# Patient Record
Sex: Female | Born: 1935 | Race: White | Hispanic: No | Smoking: Former smoker
Health system: Southern US, Community
[De-identification: ages and names within clinical notes are randomized; demographics above are authoritative.]

## PROBLEM LIST (undated history)

## (undated) DIAGNOSIS — E1142 Type 2 diabetes mellitus with diabetic polyneuropathy: Secondary | ICD-10-CM

## (undated) DIAGNOSIS — S069X9A Unspecified intracranial injury with loss of consciousness of unspecified duration, initial encounter: Secondary | ICD-10-CM

## (undated) DIAGNOSIS — I10 Essential (primary) hypertension: Secondary | ICD-10-CM

## (undated) DIAGNOSIS — K219 Gastro-esophageal reflux disease without esophagitis: Secondary | ICD-10-CM

## (undated) DIAGNOSIS — I499 Cardiac arrhythmia, unspecified: Secondary | ICD-10-CM

## (undated) DIAGNOSIS — K922 Gastrointestinal hemorrhage, unspecified: Secondary | ICD-10-CM

## (undated) DIAGNOSIS — T39395A Adverse effect of other nonsteroidal anti-inflammatory drugs [NSAID], initial encounter: Secondary | ICD-10-CM

## (undated) DIAGNOSIS — R0600 Dyspnea, unspecified: Secondary | ICD-10-CM

## (undated) DIAGNOSIS — I480 Paroxysmal atrial fibrillation: Secondary | ICD-10-CM

## (undated) DIAGNOSIS — R011 Cardiac murmur, unspecified: Secondary | ICD-10-CM

## (undated) DIAGNOSIS — E039 Hypothyroidism, unspecified: Secondary | ICD-10-CM

## (undated) DIAGNOSIS — I639 Cerebral infarction, unspecified: Secondary | ICD-10-CM

## (undated) DIAGNOSIS — N183 Chronic kidney disease, stage 3 unspecified: Secondary | ICD-10-CM

## (undated) DIAGNOSIS — G43909 Migraine, unspecified, not intractable, without status migrainosus: Secondary | ICD-10-CM

## (undated) DIAGNOSIS — G250 Essential tremor: Secondary | ICD-10-CM

## (undated) DIAGNOSIS — J189 Pneumonia, unspecified organism: Secondary | ICD-10-CM

## (undated) DIAGNOSIS — G2581 Restless legs syndrome: Secondary | ICD-10-CM

## (undated) DIAGNOSIS — E7800 Pure hypercholesterolemia, unspecified: Secondary | ICD-10-CM

## (undated) DIAGNOSIS — I5022 Chronic systolic (congestive) heart failure: Secondary | ICD-10-CM

## (undated) DIAGNOSIS — F419 Anxiety disorder, unspecified: Secondary | ICD-10-CM

## (undated) DIAGNOSIS — Z9289 Personal history of other medical treatment: Secondary | ICD-10-CM

## (undated) DIAGNOSIS — I428 Other cardiomyopathies: Secondary | ICD-10-CM

## (undated) DIAGNOSIS — I951 Orthostatic hypotension: Secondary | ICD-10-CM

## (undated) DIAGNOSIS — E119 Type 2 diabetes mellitus without complications: Secondary | ICD-10-CM

## (undated) DIAGNOSIS — M1990 Unspecified osteoarthritis, unspecified site: Secondary | ICD-10-CM

## (undated) DIAGNOSIS — D649 Anemia, unspecified: Secondary | ICD-10-CM

## (undated) DIAGNOSIS — R202 Paresthesia of skin: Secondary | ICD-10-CM

## (undated) DIAGNOSIS — K449 Diaphragmatic hernia without obstruction or gangrene: Secondary | ICD-10-CM

## (undated) HISTORY — PX: APPENDECTOMY: SHX54

## (undated) HISTORY — DX: Hypothyroidism, unspecified: E03.9

## (undated) HISTORY — PX: ABDOMINAL HYSTERECTOMY: SHX81

## (undated) HISTORY — PX: COLONOSCOPY: SHX174

## (undated) HISTORY — PX: BREAST SURGERY: SHX581

## (undated) HISTORY — DX: Type 2 diabetes mellitus with diabetic polyneuropathy: E11.42

## (undated) HISTORY — PX: CATARACT EXTRACTION W/ INTRAOCULAR LENS  IMPLANT, BILATERAL: SHX1307

## (undated) HISTORY — PX: DILATION AND CURETTAGE OF UTERUS: SHX78

## (undated) HISTORY — PX: MULTIPLE TOOTH EXTRACTIONS: SHX2053

## (undated) HISTORY — DX: Paresthesia of skin: R20.2

## (undated) HISTORY — DX: Pure hypercholesterolemia, unspecified: E78.00

## (undated) HISTORY — DX: Essential tremor: G25.0

## (undated) HISTORY — PX: TONSILLECTOMY: SUR1361

## (undated) HISTORY — DX: Restless legs syndrome: G25.81

---

## 1998-01-11 ENCOUNTER — Ambulatory Visit (HOSPITAL_COMMUNITY): Admission: RE | Admit: 1998-01-11 | Discharge: 1998-01-11 | Payer: Self-pay | Admitting: *Deleted

## 1999-10-18 ENCOUNTER — Ambulatory Visit (HOSPITAL_COMMUNITY): Admission: RE | Admit: 1999-10-18 | Discharge: 1999-10-18 | Payer: Self-pay | Admitting: General Surgery

## 2008-10-14 DIAGNOSIS — S069X9A Unspecified intracranial injury with loss of consciousness of unspecified duration, initial encounter: Secondary | ICD-10-CM

## 2008-10-14 DIAGNOSIS — S069X1A Unspecified intracranial injury with loss of consciousness of 30 minutes or less, initial encounter: Secondary | ICD-10-CM

## 2008-10-14 HISTORY — DX: Unspecified intracranial injury with loss of consciousness of 30 minutes or less, initial encounter: S06.9X1A

## 2008-10-14 HISTORY — DX: Unspecified intracranial injury with loss of consciousness of unspecified duration, initial encounter: S06.9X9A

## 2009-07-15 ENCOUNTER — Inpatient Hospital Stay (HOSPITAL_COMMUNITY): Admission: EM | Admit: 2009-07-15 | Discharge: 2009-07-20 | Payer: Self-pay | Admitting: Emergency Medicine

## 2009-07-15 ENCOUNTER — Ambulatory Visit: Payer: Self-pay | Admitting: Cardiovascular Disease

## 2009-07-17 ENCOUNTER — Encounter (INDEPENDENT_AMBULATORY_CARE_PROVIDER_SITE_OTHER): Payer: Self-pay | Admitting: General Surgery

## 2009-07-27 ENCOUNTER — Emergency Department (HOSPITAL_COMMUNITY): Admission: EM | Admit: 2009-07-27 | Discharge: 2009-07-27 | Payer: Self-pay | Admitting: Emergency Medicine

## 2009-07-27 ENCOUNTER — Encounter: Admission: RE | Admit: 2009-07-27 | Discharge: 2009-08-08 | Payer: Self-pay | Admitting: Physician Assistant

## 2009-07-29 ENCOUNTER — Inpatient Hospital Stay (HOSPITAL_COMMUNITY): Admission: EM | Admit: 2009-07-29 | Discharge: 2009-08-03 | Payer: Self-pay | Admitting: Emergency Medicine

## 2010-02-01 ENCOUNTER — Encounter: Admission: RE | Admit: 2010-02-01 | Discharge: 2010-02-01 | Payer: Self-pay | Admitting: Endocrinology

## 2010-09-08 ENCOUNTER — Inpatient Hospital Stay (HOSPITAL_COMMUNITY): Admission: EM | Admit: 2010-09-08 | Discharge: 2010-09-11 | Payer: Self-pay | Admitting: Emergency Medicine

## 2010-09-10 ENCOUNTER — Encounter (INDEPENDENT_AMBULATORY_CARE_PROVIDER_SITE_OTHER): Payer: Self-pay | Admitting: Internal Medicine

## 2010-11-28 ENCOUNTER — Other Ambulatory Visit: Payer: Self-pay | Admitting: Neurology

## 2010-11-28 ENCOUNTER — Other Ambulatory Visit: Payer: Self-pay

## 2010-11-28 DIAGNOSIS — R52 Pain, unspecified: Secondary | ICD-10-CM

## 2010-12-01 ENCOUNTER — Ambulatory Visit
Admission: RE | Admit: 2010-12-01 | Discharge: 2010-12-01 | Disposition: A | Discharge status: Home / Self Care | Payer: Medicare Other | Source: Ambulatory Visit

## 2010-12-01 DIAGNOSIS — R52 Pain, unspecified: Secondary | ICD-10-CM

## 2010-12-25 LAB — URINE CULTURE
Colony Count: 85000
Special Requests: NEGATIVE

## 2010-12-25 LAB — BASIC METABOLIC PANEL
BUN: 26 mg/dL — ABNORMAL HIGH (ref 6–23)
CO2: 24 mEq/L (ref 19–32)
CO2: 26 mEq/L (ref 19–32)
CO2: 26 mEq/L (ref 19–32)
Calcium: 8.3 mg/dL — ABNORMAL LOW (ref 8.4–10.5)
Calcium: 8.4 mg/dL (ref 8.4–10.5)
Chloride: 106 mEq/L (ref 96–112)
GFR calc Af Amer: 49 mL/min — ABNORMAL LOW (ref >60)
GFR calc Af Amer: 49 mL/min — ABNORMAL LOW (ref >60)
Glucose, Bld: 253 mg/dL — ABNORMAL HIGH (ref 70–99)
Glucose, Bld: 269 mg/dL — ABNORMAL HIGH (ref 70–99)
Potassium: 4.3 mEq/L (ref 3.5–5.1)
Potassium: 4.3 mEq/L (ref 3.5–5.1)
Sodium: 137 mEq/L (ref 135–145)
Sodium: 138 mEq/L (ref 135–145)

## 2010-12-25 LAB — GLUCOSE, CAPILLARY
Glucose-Capillary: 215 mg/dL — ABNORMAL HIGH (ref 70–99)
Glucose-Capillary: 218 mg/dL — ABNORMAL HIGH (ref 70–99)
Glucose-Capillary: 323 mg/dL — ABNORMAL HIGH (ref 70–99)
Glucose-Capillary: 323 mg/dL — ABNORMAL HIGH (ref 70–99)
Glucose-Capillary: 347 mg/dL — ABNORMAL HIGH (ref 70–99)

## 2010-12-25 LAB — CULTURE, BLOOD (ROUTINE X 2)
Culture  Setup Time: 201111271256
Culture: NO GROWTH
Culture: NO GROWTH

## 2010-12-25 LAB — CARDIAC PANEL(CRET KIN+CKTOT+MB+TROPI)
CK, MB: 0.8 ng/mL (ref 0.3–4.0)
Relative Index: INVALID (ref 0.0–2.5)
Total CK: 65 U/L (ref 7–177)
Total CK: 75 U/L (ref 7–177)
Troponin I: 0.01 ng/mL (ref 0.00–0.06)

## 2010-12-25 LAB — POCT CARDIAC MARKERS
CKMB, poc: <1 ng/mL — ABNORMAL LOW (ref 1.0–8.0)
Myoglobin, poc: 122 ng/mL (ref 12–200)

## 2010-12-25 LAB — CBC
HCT: 30 % — ABNORMAL LOW (ref 36.0–46.0)
HCT: 32.2 % — ABNORMAL LOW (ref 36.0–46.0)
Hemoglobin: 10.2 g/dL — ABNORMAL LOW (ref 12.0–15.0)
Hemoglobin: 10.2 g/dL — ABNORMAL LOW (ref 12.0–15.0)
Hemoglobin: 11.1 g/dL — ABNORMAL LOW (ref 12.0–15.0)
Hemoglobin: 12 g/dL (ref 12.0–15.0)
MCH: 29.3 pg (ref 26.0–34.0)
MCH: 29.6 pg (ref 26.0–34.0)
MCH: 29.6 pg (ref 26.0–34.0)
MCHC: 34.4 g/dL (ref 30.0–36.0)
MCHC: 34.6 g/dL (ref 30.0–36.0)
MCV: 86.4 fL (ref 78.0–100.0)
Platelets: 312 10*3/uL (ref 150–400)
RBC: 3.47 MIL/uL — ABNORMAL LOW (ref 3.87–5.11)
RBC: 3.76 MIL/uL — ABNORMAL LOW (ref 3.87–5.11)
RBC: 4.13 MIL/uL (ref 3.87–5.11)
RDW: 14.1 % (ref 11.5–15.5)
WBC: 16.9 10*3/uL — ABNORMAL HIGH (ref 4.0–10.5)

## 2010-12-25 LAB — POCT I-STAT, CHEM 8
BUN: 14 mg/dL (ref 6–23)
Creatinine, Ser: 1.2 mg/dL (ref 0.4–1.2)
Hemoglobin: 13.3 g/dL (ref 12.0–15.0)
Potassium: 3.7 mEq/L (ref 3.5–5.1)
Sodium: 137 mEq/L (ref 135–145)
TCO2: 32 mmol/L (ref 0–100)

## 2010-12-25 LAB — URINALYSIS, ROUTINE W REFLEX MICROSCOPIC
Glucose, UA: NEGATIVE mg/dL
Hgb urine dipstick: NEGATIVE
Specific Gravity, Urine: 1.013 (ref 1.005–1.030)
pH: 5.5 (ref 5.0–8.0)

## 2010-12-25 LAB — DIFFERENTIAL
Basophils Relative: 0 % (ref 0–1)
Eosinophils Absolute: 0.1 10*3/uL (ref 0.0–0.7)
Lymphs Abs: 1.3 10*3/uL (ref 0.7–4.0)
Monocytes Relative: 9 % (ref 3–12)
Neutro Abs: 5.2 10*3/uL (ref 1.7–7.7)
Neutrophils Relative %: 71 % (ref 43–77)

## 2010-12-25 LAB — HEMOGLOBIN A1C: Hgb A1c MFr Bld: 7.4 % — ABNORMAL HIGH (ref <5.7)

## 2010-12-25 LAB — MRSA PCR SCREENING: MRSA by PCR: NEGATIVE

## 2010-12-25 LAB — D-DIMER, QUANTITATIVE: D-Dimer, Quant: 0.46 ug/mL-FEU (ref 0.00–0.48)

## 2010-12-25 LAB — LEGIONELLA ANTIGEN, URINE

## 2010-12-25 LAB — URINE MICROSCOPIC-ADD ON

## 2011-01-17 LAB — URINALYSIS, ROUTINE W REFLEX MICROSCOPIC
Bilirubin Urine: NEGATIVE
Bilirubin Urine: NEGATIVE
Glucose, UA: 100 mg/dL — AB
Glucose, UA: NEGATIVE mg/dL
Hgb urine dipstick: NEGATIVE
Ketones, ur: NEGATIVE mg/dL
Protein, ur: NEGATIVE mg/dL
Specific Gravity, Urine: 1.012 (ref 1.005–1.030)
pH: 5 (ref 5.0–8.0)

## 2011-01-17 LAB — GLUCOSE, CAPILLARY
Glucose-Capillary: 114 mg/dL — ABNORMAL HIGH (ref 70–99)
Glucose-Capillary: 125 mg/dL — ABNORMAL HIGH (ref 70–99)
Glucose-Capillary: 127 mg/dL — ABNORMAL HIGH (ref 70–99)
Glucose-Capillary: 134 mg/dL — ABNORMAL HIGH (ref 70–99)
Glucose-Capillary: 135 mg/dL — ABNORMAL HIGH (ref 70–99)
Glucose-Capillary: 143 mg/dL — ABNORMAL HIGH (ref 70–99)
Glucose-Capillary: 143 mg/dL — ABNORMAL HIGH (ref 70–99)
Glucose-Capillary: 149 mg/dL — ABNORMAL HIGH (ref 70–99)
Glucose-Capillary: 163 mg/dL — ABNORMAL HIGH (ref 70–99)

## 2011-01-17 LAB — COMPREHENSIVE METABOLIC PANEL
AST: 19 U/L (ref 0–37)
Alkaline Phosphatase: 78 U/L (ref 39–117)
BUN: 23 mg/dL (ref 6–23)
CO2: 27 mEq/L (ref 19–32)
CO2: 29 mEq/L (ref 19–32)
Calcium: 9.4 mg/dL (ref 8.4–10.5)
Chloride: 100 mEq/L (ref 96–112)
Chloride: 99 mEq/L (ref 96–112)
Creatinine, Ser: 1.47 mg/dL — ABNORMAL HIGH (ref 0.4–1.2)
Creatinine, Ser: 1.59 mg/dL — ABNORMAL HIGH (ref 0.4–1.2)
GFR calc Af Amer: 38 mL/min — ABNORMAL LOW (ref >60)
GFR calc Af Amer: 41 mL/min — ABNORMAL LOW (ref >60)
GFR calc non Af Amer: 31 mL/min — ABNORMAL LOW (ref >60)
GFR calc non Af Amer: 34 mL/min — ABNORMAL LOW (ref >60)
Potassium: 3.8 mEq/L (ref 3.5–5.1)
Total Bilirubin: 0.4 mg/dL (ref 0.3–1.2)
Total Bilirubin: 0.8 mg/dL (ref 0.3–1.2)

## 2011-01-17 LAB — BASIC METABOLIC PANEL
CO2: 29 mEq/L (ref 19–32)
Calcium: 8.7 mg/dL (ref 8.4–10.5)
Chloride: 100 mEq/L (ref 96–112)
Creatinine, Ser: 1.35 mg/dL — ABNORMAL HIGH (ref 0.4–1.2)
GFR calc Af Amer: 45 mL/min — ABNORMAL LOW (ref >60)
GFR calc Af Amer: 46 mL/min — ABNORMAL LOW (ref >60)
GFR calc non Af Amer: 38 mL/min — ABNORMAL LOW (ref >60)
Glucose, Bld: 120 mg/dL — ABNORMAL HIGH (ref 70–99)
Potassium: 3.4 mEq/L — ABNORMAL LOW (ref 3.5–5.1)
Sodium: 136 mEq/L (ref 135–145)

## 2011-01-17 LAB — CBC
HCT: 36.4 % (ref 36.0–46.0)
HCT: 38 % (ref 36.0–46.0)
Hemoglobin: 13.7 g/dL (ref 12.0–15.0)
MCHC: 32.9 g/dL (ref 30.0–36.0)
MCHC: 34.3 g/dL (ref 30.0–36.0)
MCV: 87.1 fL (ref 78.0–100.0)
MCV: 87.4 fL (ref 78.0–100.0)
MCV: 87.6 fL (ref 78.0–100.0)
Platelets: 166 10*3/uL (ref 150–400)
RBC: 4.28 MIL/uL (ref 3.87–5.11)
RBC: 4.36 MIL/uL (ref 3.87–5.11)
RBC: 4.77 MIL/uL (ref 3.87–5.11)
RDW: 14 % (ref 11.5–15.5)
WBC: 5.4 10*3/uL (ref 4.0–10.5)
WBC: 5.8 10*3/uL (ref 4.0–10.5)
WBC: 8.6 10*3/uL (ref 4.0–10.5)

## 2011-01-17 LAB — BENZODIAZEPINE, QUANTITATIVE, URINE
Alprazolam (GC/LC/MS), ur confirm: NEGATIVE
Flurazepam GC/MS Conf: NEGATIVE
Nordiazepam GC/MS Conf: NEGATIVE
Oxazepam GC/MS Conf: 1800 ng/mL

## 2011-01-17 LAB — DIFFERENTIAL
Basophils Absolute: 0 10*3/uL (ref 0.0–0.1)
Basophils Relative: 0 % (ref 0–1)
Basophils Relative: 1 % (ref 0–1)
Eosinophils Absolute: 0.1 10*3/uL (ref 0.0–0.7)
Eosinophils Relative: 2 % (ref 0–5)
Lymphocytes Relative: 18 % (ref 12–46)
Lymphocytes Relative: 30 % (ref 12–46)
Lymphs Abs: 1.5 10*3/uL (ref 0.7–4.0)
Monocytes Relative: 5 % (ref 3–12)
Neutro Abs: 6.6 10*3/uL (ref 1.7–7.7)
Neutrophils Relative %: 76 % (ref 43–77)

## 2011-01-17 LAB — CARDIAC PANEL(CRET KIN+CKTOT+MB+TROPI)
CK, MB: 0.5 ng/mL (ref 0.3–4.0)
CK, MB: 0.7 ng/mL (ref 0.3–4.0)
CK, MB: 0.8 ng/mL (ref 0.3–4.0)
Relative Index: INVALID (ref 0.0–2.5)
Relative Index: INVALID (ref 0.0–2.5)
Total CK: 41 U/L (ref 7–177)
Total CK: 57 U/L (ref 7–177)
Troponin I: 0.01 ng/mL (ref 0.00–0.06)
Troponin I: 0.01 ng/mL (ref 0.00–0.06)
Troponin I: <0.01 ng/mL (ref 0.00–0.06)

## 2011-01-17 LAB — URINE CULTURE: Colony Count: 40000

## 2011-01-17 LAB — HEMOGLOBIN A1C: Mean Plasma Glucose: 154 mg/dL

## 2011-01-17 LAB — DRUGS OF ABUSE SCREEN W/O ALC, ROUTINE URINE
Amphetamine Screen, Ur: NEGATIVE
Barbiturate Quant, Ur: NEGATIVE
Cocaine Metabolites: NEGATIVE
Marijuana Metabolite: NEGATIVE
Methadone: NEGATIVE
Phencyclidine (PCP): NEGATIVE

## 2011-01-17 LAB — URINE MICROSCOPIC-ADD ON

## 2011-01-17 LAB — TSH: TSH: 4.235 u[IU]/mL (ref 0.350–4.500)

## 2011-01-17 LAB — CORTISOL: Cortisol, Plasma: 3.3 ug/dL

## 2011-01-17 LAB — LIPID PANEL
Cholesterol: 170 mg/dL (ref 0–200)
HDL: 39 mg/dL — ABNORMAL LOW (ref >39)
Triglycerides: 237 mg/dL — ABNORMAL HIGH (ref <150)

## 2011-01-17 LAB — TROPONIN I
Troponin I: 0.01 ng/mL (ref 0.00–0.06)
Troponin I: <0.01 ng/mL (ref 0.00–0.06)

## 2011-01-17 LAB — POTASSIUM: Potassium: 4.2 mEq/L (ref 3.5–5.1)

## 2011-01-17 LAB — PROTIME-INR: Prothrombin Time: 12.4 seconds (ref 11.6–15.2)

## 2011-09-22 ENCOUNTER — Ambulatory Visit (INDEPENDENT_AMBULATORY_CARE_PROVIDER_SITE_OTHER): Payer: Medicare Other

## 2011-09-22 ENCOUNTER — Inpatient Hospital Stay (HOSPITAL_COMMUNITY)
Admission: AD | Admit: 2011-09-22 | Discharge: 2011-09-25 | DRG: 202 | Disposition: A | Discharge status: Home / Self Care | Payer: Medicare Other | Source: Ambulatory Visit | Attending: Family Medicine | Admitting: Family Medicine

## 2011-09-22 ENCOUNTER — Encounter (HOSPITAL_COMMUNITY): Payer: Self-pay | Admitting: *Deleted

## 2011-09-22 ENCOUNTER — Inpatient Hospital Stay (HOSPITAL_COMMUNITY): Payer: Medicare Other

## 2011-09-22 DIAGNOSIS — J151 Pneumonia due to Pseudomonas: Secondary | ICD-10-CM

## 2011-09-22 DIAGNOSIS — E785 Hyperlipidemia, unspecified: Secondary | ICD-10-CM | POA: Insufficient documentation

## 2011-09-22 DIAGNOSIS — Z7982 Long term (current) use of aspirin: Secondary | ICD-10-CM

## 2011-09-22 DIAGNOSIS — R4182 Altered mental status, unspecified: Secondary | ICD-10-CM | POA: Diagnosis not present

## 2011-09-22 DIAGNOSIS — E1140 Type 2 diabetes mellitus with diabetic neuropathy, unspecified: Secondary | ICD-10-CM | POA: Diagnosis present

## 2011-09-22 DIAGNOSIS — M129 Arthropathy, unspecified: Secondary | ICD-10-CM | POA: Diagnosis present

## 2011-09-22 DIAGNOSIS — R059 Cough, unspecified: Secondary | ICD-10-CM

## 2011-09-22 DIAGNOSIS — K219 Gastro-esophageal reflux disease without esophagitis: Secondary | ICD-10-CM | POA: Diagnosis present

## 2011-09-22 DIAGNOSIS — I10 Essential (primary) hypertension: Secondary | ICD-10-CM | POA: Diagnosis present

## 2011-09-22 DIAGNOSIS — R05 Cough: Secondary | ICD-10-CM

## 2011-09-22 DIAGNOSIS — N179 Acute kidney failure, unspecified: Secondary | ICD-10-CM | POA: Diagnosis not present

## 2011-09-22 DIAGNOSIS — J189 Pneumonia, unspecified organism: Secondary | ICD-10-CM | POA: Diagnosis present

## 2011-09-22 DIAGNOSIS — R404 Transient alteration of awareness: Secondary | ICD-10-CM | POA: Diagnosis present

## 2011-09-22 DIAGNOSIS — R0902 Hypoxemia: Secondary | ICD-10-CM

## 2011-09-22 DIAGNOSIS — J45901 Unspecified asthma with (acute) exacerbation: Principal | ICD-10-CM | POA: Diagnosis present

## 2011-09-22 DIAGNOSIS — I509 Heart failure, unspecified: Secondary | ICD-10-CM | POA: Diagnosis present

## 2011-09-22 DIAGNOSIS — R000 Tachycardia, unspecified: Secondary | ICD-10-CM

## 2011-09-22 DIAGNOSIS — Z79899 Other long term (current) drug therapy: Secondary | ICD-10-CM

## 2011-09-22 DIAGNOSIS — Z87891 Personal history of nicotine dependence: Secondary | ICD-10-CM

## 2011-09-22 DIAGNOSIS — Z794 Long term (current) use of insulin: Secondary | ICD-10-CM

## 2011-09-22 DIAGNOSIS — I519 Heart disease, unspecified: Secondary | ICD-10-CM | POA: Diagnosis present

## 2011-09-22 DIAGNOSIS — K449 Diaphragmatic hernia without obstruction or gangrene: Secondary | ICD-10-CM | POA: Diagnosis present

## 2011-09-22 DIAGNOSIS — E119 Type 2 diabetes mellitus without complications: Secondary | ICD-10-CM | POA: Diagnosis present

## 2011-09-22 DIAGNOSIS — F411 Generalized anxiety disorder: Secondary | ICD-10-CM | POA: Diagnosis present

## 2011-09-22 DIAGNOSIS — G4730 Sleep apnea, unspecified: Secondary | ICD-10-CM | POA: Diagnosis present

## 2011-09-22 HISTORY — DX: Anemia, unspecified: D64.9

## 2011-09-22 HISTORY — DX: Gastro-esophageal reflux disease without esophagitis: K21.9

## 2011-09-22 HISTORY — DX: Essential (primary) hypertension: I10

## 2011-09-22 HISTORY — DX: Anxiety disorder, unspecified: F41.9

## 2011-09-22 HISTORY — DX: Unspecified osteoarthritis, unspecified site: M19.90

## 2011-09-22 HISTORY — DX: Diaphragmatic hernia without obstruction or gangrene: K44.9

## 2011-09-22 HISTORY — DX: Pneumonia, unspecified organism: J18.9

## 2011-09-22 LAB — BASIC METABOLIC PANEL
BUN: 10 mg/dL (ref 6–23)
CO2: 27 mEq/L (ref 19–32)
Chloride: 99 mEq/L (ref 96–112)
Creatinine, Ser: 1.19 mg/dL — ABNORMAL HIGH (ref 0.50–1.10)
Glucose, Bld: 142 mg/dL — ABNORMAL HIGH (ref 70–99)

## 2011-09-22 LAB — CBC
HCT: 32.6 % — ABNORMAL LOW (ref 36.0–46.0)
MCH: 27.5 pg (ref 26.0–34.0)
MCHC: 32.5 g/dL (ref 30.0–36.0)
MCV: 84.7 fL (ref 78.0–100.0)
RDW: 15.2 % (ref 11.5–15.5)

## 2011-09-22 LAB — DIFFERENTIAL
Basophils Absolute: 0 10*3/uL (ref 0.0–0.1)
Eosinophils Absolute: 0.2 10*3/uL (ref 0.0–0.7)
Lymphs Abs: 1.9 10*3/uL (ref 0.7–4.0)
Monocytes Absolute: 1.1 10*3/uL — ABNORMAL HIGH (ref 0.1–1.0)
Monocytes Relative: 11 % (ref 3–12)
Neutro Abs: 7.2 10*3/uL (ref 1.7–7.7)

## 2011-09-22 LAB — HEMOGLOBIN A1C: Hgb A1c MFr Bld: 7.6 % — ABNORMAL HIGH (ref <5.7)

## 2011-09-22 LAB — GLUCOSE, CAPILLARY: Glucose-Capillary: 140 mg/dL — ABNORMAL HIGH (ref 70–99)

## 2011-09-22 MED ORDER — POTASSIUM CHLORIDE IN NACL 20-0.9 MEQ/L-% IV SOLN
INTRAVENOUS | Status: DC
  Administered 2011-09-22 – 2011-09-24 (×3): via INTRAVENOUS
  Filled 2011-09-22 (×3): qty 1000

## 2011-09-22 MED ORDER — TRIAMTERENE-HCTZ 37.5-25 MG PO TABS
1.0000 | ORAL_TABLET | Freq: Every day | ORAL | Status: DC
  Administered 2011-09-22 – 2011-09-25 (×4): 1 via ORAL
  Filled 2011-09-22 (×4): qty 1

## 2011-09-22 MED ORDER — ATENOLOL 50 MG PO TABS
50.0000 mg | ORAL_TABLET | Freq: Every day | ORAL | Status: DC
  Administered 2011-09-22 – 2011-09-25 (×4): 50 mg via ORAL
  Filled 2011-09-22 (×4): qty 1

## 2011-09-22 MED ORDER — INSULIN ASPART 100 UNIT/ML ~~LOC~~ SOLN
4.0000 [IU] | Freq: Three times a day (TID) | SUBCUTANEOUS | Status: DC
  Administered 2011-09-22 – 2011-09-25 (×9): 4 [IU] via SUBCUTANEOUS

## 2011-09-22 MED ORDER — PANTOPRAZOLE SODIUM 40 MG PO TBEC
40.0000 mg | DELAYED_RELEASE_TABLET | Freq: Every day | ORAL | Status: DC
  Administered 2011-09-22 – 2011-09-25 (×4): 40 mg via ORAL
  Filled 2011-09-22 (×4): qty 1

## 2011-09-22 MED ORDER — POTASSIUM CHLORIDE CRYS ER 20 MEQ PO TBCR
40.0000 meq | EXTENDED_RELEASE_TABLET | Freq: Once | ORAL | Status: AC
  Administered 2011-09-22: 40 meq via ORAL
  Filled 2011-09-22: qty 2

## 2011-09-22 MED ORDER — INSULIN ASPART 100 UNIT/ML ~~LOC~~ SOLN
0.0000 [IU] | Freq: Three times a day (TID) | SUBCUTANEOUS | Status: DC
  Administered 2011-09-22 – 2011-09-23 (×3): 2 [IU] via SUBCUTANEOUS
  Administered 2011-09-24 (×2): 3 [IU] via SUBCUTANEOUS
  Administered 2011-09-24: 5 [IU] via SUBCUTANEOUS
  Administered 2011-09-25: 2 [IU] via SUBCUTANEOUS
  Administered 2011-09-25: 3 [IU] via SUBCUTANEOUS
  Administered 2011-09-25: 2 [IU] via SUBCUTANEOUS
  Filled 2011-09-22: qty 3

## 2011-09-22 MED ORDER — DEXTROSE 5 % IV SOLN
1.0000 g | INTRAVENOUS | Status: DC
  Administered 2011-09-23: 1 g via INTRAVENOUS
  Filled 2011-09-22: qty 10

## 2011-09-22 MED ORDER — ASPIRIN EC 81 MG PO TBEC
81.0000 mg | DELAYED_RELEASE_TABLET | Freq: Every day | ORAL | Status: DC
  Administered 2011-09-22 – 2011-09-25 (×4): 81 mg via ORAL
  Filled 2011-09-22 (×4): qty 1

## 2011-09-22 MED ORDER — SIMVASTATIN 40 MG PO TABS
40.0000 mg | ORAL_TABLET | Freq: Every day | ORAL | Status: DC
  Administered 2011-09-22 – 2011-09-25 (×4): 40 mg via ORAL
  Filled 2011-09-22 (×4): qty 1

## 2011-09-22 MED ORDER — AEROCHAMBER PLUS W/MASK LARGE MISC
1.0000 | Freq: Once | Status: DC
  Filled 2011-09-22: qty 1

## 2011-09-22 MED ORDER — AMITRIPTYLINE HCL 10 MG PO TABS
20.0000 mg | ORAL_TABLET | Freq: Every evening | ORAL | Status: DC | PRN
  Filled 2011-09-22: qty 2

## 2011-09-22 MED ORDER — INSULIN GLARGINE 100 UNIT/ML ~~LOC~~ SOLN
5.0000 [IU] | Freq: Every day | SUBCUTANEOUS | Status: DC
  Administered 2011-09-22 – 2011-09-23 (×2): 5 [IU] via SUBCUTANEOUS
  Filled 2011-09-22: qty 3

## 2011-09-22 MED ORDER — DEXTROSE 5 % IV SOLN
500.0000 mg | INTRAVENOUS | Status: DC
  Administered 2011-09-22 – 2011-09-24 (×3): 500 mg via INTRAVENOUS
  Filled 2011-09-22 (×5): qty 500

## 2011-09-22 MED ORDER — ACETAMINOPHEN 325 MG PO TABS
650.0000 mg | ORAL_TABLET | Freq: Four times a day (QID) | ORAL | Status: DC | PRN
  Administered 2011-09-23 – 2011-09-24 (×2): 650 mg via ORAL
  Filled 2011-09-22 (×2): qty 2

## 2011-09-22 MED ORDER — HEPARIN SODIUM (PORCINE) 5000 UNIT/ML IJ SOLN
5000.0000 [IU] | Freq: Three times a day (TID) | INTRAMUSCULAR | Status: DC
  Administered 2011-09-22 – 2011-09-25 (×11): 5000 [IU] via SUBCUTANEOUS
  Filled 2011-09-22 (×12): qty 1

## 2011-09-22 MED ORDER — ALBUTEROL SULFATE HFA 108 (90 BASE) MCG/ACT IN AERS
2.0000 | INHALATION_SPRAY | RESPIRATORY_TRACT | Status: DC | PRN
Start: 2011-09-22 — End: 2011-09-23
  Filled 2011-09-22: qty 6.7

## 2011-09-22 MED ORDER — BENZONATATE 100 MG PO CAPS
100.0000 mg | ORAL_CAPSULE | Freq: Two times a day (BID) | ORAL | Status: DC | PRN
  Administered 2011-09-22: 100 mg via ORAL
  Filled 2011-09-22: qty 1

## 2011-09-22 NOTE — H&P (Signed)
Melissa [name] is an 77 y.o. female.    Chief Complaint: Community Acquired Pneumonia  HPI: Melissa [name] is a pleasant 77 year old woman with a past medical history of bronchitis and asthma who presents from an outside clinic with presumed community-acquired pneumonia. She reports a 5 day history of cough, chest pain associated with the cough, nasal congestion and shortness of breath with activity. Melissa [name] presented to her PCP Dr. [name] @ [place] several days ago and was given Amoxicillin for her symptoms. Her symptoms did not improve, so she went to [facility] [place] this morning. She was believed to have a community acquired pneumonia with hypoxia requiring oxygen. Melissa [name] was given albuterol breathing treatments and placed on O2 via nasal cannula for her respiratory status. She was also given Ceftriaxone 1 gm IM for presumed CAP. She was also tested for influenza, which was negative. Melissa [name] currently notes that her breathing is easier, but not her cough.    Past Medical History  Diagnosis Date  . Asthma   . Sleep apnea   . Angina   . Hypertension   . Blood transfusion   . GERD (gastroesophageal reflux disease)   . Headache   . Arthritis   . Anxiety   . Pneumonia   . Diabetes mellitus   . Anemia   . Hiatal hernia     Past Surgical History  Procedure Date  . Abdominal hysterectomy   . Appendectomy   . Eye surgery   . Tonsillectomy     History  Substance Use Topics  . Smoking status: Former Smoker    Quit date: 09/21/1961  . Smokeless tobacco: Not on file  . Alcohol Use: No    Allergies: No Known Allergies  Home Medications: Actos 45 mg daily Atenolol 50 mg daily ASA 81 mg daily Amitriptyline 10 mg QHS Pantoprazole 40 mg BID Atorvastatin 20 mg daily Triamterene-HCTZ 37.5-25 mg    Review of Systems  Constitutional: Positive for fever, chills and malaise/fatigue.  HENT: Positive for congestion.   Eyes: Negative  for blurred vision.  Respiratory: Positive for cough and sputum production.   Cardiovascular: Positive for chest pain.  Gastrointestinal: Positive for heartburn. Negative for nausea, vomiting, abdominal pain, diarrhea and constipation.  Musculoskeletal: Negative for myalgias.  Skin: Negative for rash.  Neurological: Positive for headaches. Negative for dizziness.    Blood pressure 147/80, pulse 99, temperature 99.7 F (37.6 C), temperature source Oral, resp. rate 20, height 5\' 5"  (1.651 m), weight 184 lb 4.9 oz (83.6 kg), SpO2 96.00%. Physical Exam  Constitutional: She appears well-developed and well-nourished. She does not appear ill. No distress.  HENT:  Head: Normocephalic and atraumatic.  Cardiovascular: Normal heart sounds and normal pulses.  Tachycardia present.   Respiratory: Effort normal. She has no decreased breath sounds. She has wheezes in the right upper field, the right lower field, the left upper field and the left lower field. She has rales in the right lower field and the left lower field.       Persistent coughing throughout exam  GI: Soft. Normal appearance and bowel sounds are normal. There is no tenderness.  Musculoskeletal:       No peripheral edema or tenderness  Skin: Skin is warm and dry. No abrasion and no rash noted. She is not diaphoretic.     Assessment/Plan Melissa [name] is an 77 year old lady with an increased O2 requirement that could be the result of  community acquired pneumonia or reactive airway disease, but likely a combination of both. Therefore, she will be admitted to a telemetry bed on the [facility] Medicine Teaching Service.    1. Respiratory - Pt currently has a new oxygen requirement with abnormal finding on auscultation  - Continue O2 support  - 2 view chest X-ray (images sent from [place] were not compatible with our system)  - Ceftriaxone 1 mg daily starting tomorrow; Azithromycin 500 mg daily x 5 days  - Albuterol nebs q 4 hours scheduled, q 2  PRN; tessalon perles for cough   2. Hypertension - continue home meds atenolol, triamterence-HCTZ  3. Hyperlipidemia - continue home statin   4. Diabetes Mellitus - hold home actos; start Lantus @ 5 units q AM, moderate sliding scale insulin   5. Sleep - amitriptyline QHS  6. FENGI: NS w/ KCl @ 75 mL/hr, carb modified diet   7. PPX: heparin 5000 U [place] daily; protonix 40 mg daily   WILLIAMSON, EDWARD 09/22/2011, 4:12 PM   R2 Addendum of R1 History and Physical.   Briefly, Melissa [name] is an 77 year old female who presented to [facility] with cough and shortness of breath, found to be hypoxic and tachycardic, and PNA seen on CXR.   BP 147/80  Pulse 99  Temp(Src) 99.7 F (37.6 C) (Oral)  Resp 20  Ht 5\' 5"  (1.651 m)  Wt 184 lb 4.9 oz (83.6 kg)  BMI 30.67 kg/m2  SpO2 96% General appearance: alert, cooperative and no distress Eyes: PERRL, EOMIT Neck: no adenopathy, no JVD, supple, symmetrical, trachea midline and thyroid not enlarged, symmetric, no tenderness/mass/nodules Lungs: Patient with some crackles in lower lung fields bilaterally, also with some wheezes in upper lung fields.  Heart: regular rate and rhythm, S1, S2 normal, no murmur, click, rub or gallop Abdomen: soft, non-tender; bowel sounds normal; no masses,  no organomegaly Extremities: extremities normal, atraumatic, no cyanosis or edema Pulses: 2+ and symmetric  A/P: Melissa [name] is an 77 year old female who presents to the hospital with about one week of cough, congestion, subjective fevers, and dyspnea.  She presented to [facility] and was found to be hypoxic and tachycardic on Physical exam and CXR showed PNA:  1) Pulm: Community Acquired PNA- pt received IM Ceftriaxone at Urgent Care which we will continue, we will also give Azithromycin.  Will continue oxygen supplementation and monitor O2 sats, wean Oxygen when tolerated.  For Asthma we will give Albuterol as needed for wheezing or shortness of breath.   For cough, we will give tessalon pearls.  2) ID- Patient with low grade fever and tachycardia.  She received antibiotics at Urgent Care, and is non-toxic appearing, so we will not order cultures at this time.  If she spikes a fever, will plan on getting blood cultures.  Tylenol as needed for fever.  Will obtain CBC with diff.  3) CV: Patient borderline tachycardic upon admission, will monitor on telemetry and give IV fluids.  Will continue home blood pressure medications. 4) DM- Will give 5 units of lantus and monitor CBG's qachs and give SSI.   5) HLD- will continue a statin.  6) FEN/GI- Will give carb consistent diet and IVF @ 75cc since she is tachycardic.  Will obtain BMET.   7) DVT PPX- SQ Heparin 8) Disposition- Pending clinical improvement. Hollan Philipp MD, PGY2 09/22/11/ 4:45 PM

## 2011-09-23 DIAGNOSIS — J45901 Unspecified asthma with (acute) exacerbation: Secondary | ICD-10-CM | POA: Diagnosis present

## 2011-09-23 DIAGNOSIS — I5023 Acute on chronic systolic (congestive) heart failure: Secondary | ICD-10-CM

## 2011-09-23 DIAGNOSIS — J441 Chronic obstructive pulmonary disease with (acute) exacerbation: Secondary | ICD-10-CM

## 2011-09-23 DIAGNOSIS — F05 Delirium due to known physiological condition: Secondary | ICD-10-CM

## 2011-09-23 LAB — BASIC METABOLIC PANEL
BUN: 11 mg/dL (ref 6–23)
Chloride: 101 mEq/L (ref 96–112)
GFR calc Af Amer: 49 mL/min — ABNORMAL LOW (ref >90)
Potassium: 4.1 mEq/L (ref 3.5–5.1)

## 2011-09-23 LAB — CBC
HCT: 31.8 % — ABNORMAL LOW (ref 36.0–46.0)
Hemoglobin: 9.9 g/dL — ABNORMAL LOW (ref 12.0–15.0)
MCHC: 31.1 g/dL (ref 30.0–36.0)

## 2011-09-23 LAB — GLUCOSE, CAPILLARY: Glucose-Capillary: 93 mg/dL (ref 70–99)

## 2011-09-23 LAB — LEGIONELLA ANTIGEN, URINE

## 2011-09-23 MED ORDER — ALBUTEROL SULFATE (5 MG/ML) 0.5% IN NEBU
2.5000 mg | INHALATION_SOLUTION | RESPIRATORY_TRACT | Status: DC | PRN
  Administered 2011-09-23 – 2011-09-24 (×3): 2.5 mg via RESPIRATORY_TRACT
  Filled 2011-09-23 (×4): qty 0.5

## 2011-09-23 MED ORDER — PREDNISONE 50 MG PO TABS
50.0000 mg | ORAL_TABLET | Freq: Every day | ORAL | Status: DC
  Administered 2011-09-23 – 2011-09-24 (×2): 50 mg via ORAL
  Filled 2011-09-23 (×3): qty 1

## 2011-09-23 MED ORDER — BENZONATATE 100 MG PO CAPS
200.0000 mg | ORAL_CAPSULE | Freq: Three times a day (TID) | ORAL | Status: DC
  Filled 2011-09-23 (×2): qty 2

## 2011-09-23 MED ORDER — GUAIFENESIN 200 MG PO TABS
200.0000 mg | ORAL_TABLET | Freq: Four times a day (QID) | ORAL | Status: DC
  Administered 2011-09-23 – 2011-09-24 (×4): 200 mg via ORAL
  Filled 2011-09-23 (×8): qty 1

## 2011-09-23 MED ORDER — BENZONATATE 100 MG PO CAPS
100.0000 mg | ORAL_CAPSULE | Freq: Three times a day (TID) | ORAL | Status: DC
  Administered 2011-09-23: 100 mg via ORAL
  Filled 2011-09-23 (×4): qty 1

## 2011-09-23 MED ORDER — BENZONATATE 100 MG PO CAPS
200.0000 mg | ORAL_CAPSULE | Freq: Three times a day (TID) | ORAL | Status: DC
  Administered 2011-09-23 – 2011-09-25 (×8): 200 mg via ORAL
  Filled 2011-09-23 (×8): qty 2

## 2011-09-23 NOTE — Progress Notes (Signed)
Daily Progress Note Melissa [name]. Melissa [name], M.D., M.B.A  Family Medicine PGY-1 Pager [phone]  Subjective: 1. Persistent cough decreased from yesterday 2. Shortness of Breath - present, worsened by walking, still using O2 via n/c   Objective: Vital signs in last 24 hours: Temp:  [97.5 F (36.4 C)-99.7 F (37.6 C)] 97.5 F (36.4 C) (12/10 0427) Pulse Rate:  [85-116] 97  (12/10 0427) Resp:  [19-22] 22  (12/10 0427) BP: (115-147)/(66-80) 119/72 mmHg (12/10 0427) SpO2:  [92 %-99 %] 92 % (12/10 0427) Weight:  [180 lb 12.4 oz (82 kg)-184 lb 4.9 oz (83.6 kg)] 180 lb 12.4 oz (82 kg) (12/10 0427) Weight change:  Last BM Date: 09/22/11  Intake/Output from previous day: 12/09 0701 - 12/10 0700 In: 281.3 [I.V.:31.3; IV Piggyback:250] Out: -  Intake/Output this shift: Total I/O In: 240 [P.O.:240] Out: -   Gen: alert, oriented, pleasant, coughing but non-distressed Lungs: wheezes throughout all lung fields, transmitted from upper airway; dullness to percussion RLL Cardiac: RRR, no murmurs Extremities: no edema, non-tender, no evidence of DVT   Lab Results:  [facility] 09/23/11 0629 09/22/11 1625  WBC 7.0 10.4  HGB 9.9* 10.6*  HCT 31.8* 32.6*  PLT 203 198   BMET  Basename 09/23/11 0629 09/22/11 1625  NA 135 136  K 4.1 3.4*  CL 101 99  CO2 25 27  GLUCOSE 131* 142*  BUN 11 10  CREATININE 1.17* 1.19*  CALCIUM 8.6 8.6    Studies/Results: Dg Pneumonia Chest 2v  09/22/2011  *RADIOLOGY REPORT*  Clinical Data: Cough and shortness of breath.  CHEST - 2 VIEW  Comparison: CT and plain films of the chest 09/08/2010.  Findings: The lungs are clear.  Heart size is normal.  No pneumothorax or pleural effusion.  IMPRESSION: No acute disease.  Original Report Authenticated By: Arvid Right. Luther Parody, M.D.    Medications:  I have reviewed the patient's current medications. Scheduled:   . aerochamber plus with mask- large  1 each Other Once  . aspirin EC  81 mg Oral Daily  . atenolol  50  mg Oral Daily  . azithromycin  500 mg Intravenous Q24H  . benzonatate  100 mg Oral TID  . cefTRIAXone (ROCEPHIN)  IV  1 g Intravenous Q24H  . guaiFENesin  200 mg Oral Q6H  . heparin  5,000 Units Subcutaneous Q8H  . insulin aspart  0-15 Units Subcutaneous TID WC  . insulin aspart  4 Units Subcutaneous TID WC  . insulin glargine  5 Units Subcutaneous QHS  . pantoprazole  40 mg Oral Daily  . potassium chloride  40 mEq Oral Once  . simvastatin  40 mg Oral Daily  . triamterene-hydrochlorothiazide  1 each Oral Daily   [id number], albuterol, amitriptyline, DISCONTD: albuterol, DISCONTD: benzonatate  Assessment/Plan: Melissa [name] is an 77 year old lady with an increased O2 requirement that could be the result of community acquired pneumonia or reactive airway disease, but likely a combination of both. Therefore, she will be admitted to a telemetry bed on the [facility] Medicine Teaching Service.   1. Respiratory - Pt currently has a new oxygen requirement with abnormal findings on auscultation; X-ray negative for consolidation ; this is likely a viral URI causing exacerbation of reactive airway disease   12/10 - patient still with O2 requirement and wheezing  - Ceftriaxone 1 mg daily starting 12/9; Azithromycin 500 mg daily x 5 days - started 12/9 - Albuterol nebs q 4 hours scheduled, consider adding steroid q 2 PRN; tessalon perles  TID; started guaifenesin  - call Dr. Laqueta Jean for info regarding PMH   2. Hypertension - continue home meds atenolol, triamterence-HCTZ   3. Hyperlipidemia - continue home statin   4. Diabetes Mellitus - hold home actos; start Lantus @ 5 units q AM, moderate sliding scale insulin  CBG (last 3)   Basename 09/23/11 0617 09/22/11 2325 09/22/11 1705  GLUCAP 131* 140* 134*    5. Sleep - amitriptyline QHS   6. FENGI: NS w/ KCl @ 75 mL/hr, carb modified diet   7. PPX: heparin 5000 U [place] daily; protonix 40 mg daily       LOS: 1 day   Dewain Penning 09/23/2011, 9:54 AM

## 2011-09-23 NOTE — H&P (Signed)
Family Medicine Teaching Service Attending Note  I interviewed and examined patient Melissa [name] and reviewed their tests and x-rays.  I discussed with Dr. Barbra Sarks and [name] and reviewed their note for today.  I agree with their assessment and plan.     Additionally  This am coughing frequently alert and no acute distress  Ate all breakfast  Able to walk in hall without assistance and pulse ox of 92 on RA No infiltrate on CHEST XRAY here Recommend treatment for bronchits with zithromycin and albuterol MDI  Can discharge later today if stable home situation and has follow up

## 2011-09-24 ENCOUNTER — Inpatient Hospital Stay (HOSPITAL_COMMUNITY): Payer: Medicare Other

## 2011-09-24 LAB — GLUCOSE, CAPILLARY
Glucose-Capillary: 186 mg/dL — ABNORMAL HIGH (ref 70–99)
Glucose-Capillary: 222 mg/dL — ABNORMAL HIGH (ref 70–99)
Glucose-Capillary: 178 mg/dL — ABNORMAL HIGH (ref 70–99)

## 2011-09-24 LAB — CBC
MCV: 85.5 fL (ref 78.0–100.0)
Platelets: 253 10*3/uL (ref 150–400)
RBC: 4.35 MIL/uL (ref 3.87–5.11)
WBC: 9.6 10*3/uL (ref 4.0–10.5)

## 2011-09-24 LAB — URINALYSIS, ROUTINE W REFLEX MICROSCOPIC
Hgb urine dipstick: NEGATIVE
Nitrite: NEGATIVE
Specific Gravity, Urine: 1.017 (ref 1.005–1.030)
Urobilinogen, UA: 0.2 mg/dL (ref 0.0–1.0)

## 2011-09-24 LAB — BASIC METABOLIC PANEL
CO2: 20 mEq/L (ref 19–32)
Chloride: 100 mEq/L (ref 96–112)
Potassium: 4.5 mEq/L (ref 3.5–5.1)
Sodium: 133 mEq/L — ABNORMAL LOW (ref 135–145)

## 2011-09-24 LAB — PRO B NATRIURETIC PEPTIDE: Pro B Natriuretic peptide (BNP): 3031 pg/mL — ABNORMAL HIGH (ref 0–450)

## 2011-09-24 LAB — TROPONIN I: Troponin I: <0.3 ng/mL (ref <0.30)

## 2011-09-24 MED ORDER — INSULIN GLARGINE 100 UNIT/ML ~~LOC~~ SOLN
10.0000 [IU] | Freq: Every day | SUBCUTANEOUS | Status: DC
  Administered 2011-09-24 – 2011-09-25 (×2): 10 [IU] via SUBCUTANEOUS

## 2011-09-24 MED ORDER — GUAIFENESIN 200 MG PO TABS
400.0000 mg | ORAL_TABLET | Freq: Four times a day (QID) | ORAL | Status: DC
  Administered 2011-09-24 – 2011-09-25 (×5): 400 mg via ORAL
  Filled 2011-09-24 (×8): qty 2

## 2011-09-24 MED ORDER — FUROSEMIDE 10 MG/ML IJ SOLN
40.0000 mg | Freq: Once | INTRAMUSCULAR | Status: AC
  Administered 2011-09-24: 40 mg via INTRAVENOUS

## 2011-09-24 NOTE — Progress Notes (Signed)
Daily Progress Note Melissa [name]. Melissa [name], M.D., M.B.A  Family Medicine PGY-1 Pager [phone]  Subjective: Patient is acutely disoriented this morning. She is oriented to person only. She is tearful and hyper-religious. This is a striking deviation from baseline. The patient's daughter Melissa [name] is in the room with her. Melissa [name] states that Melissa [name] has had a few episodes like this in the past two years since a concussion. The episodes are characterized by disorientation and hyper-religious speak, and they resolved spontaneously within a day.     Respiratory status - patient denies shortness of breath, but has audible wheezes from across the room  Objective: Vital signs in last 24 hours: Temp:  [97.2 F (36.2 C)-98.2 F (36.8 C)] 97.2 F (36.2 C) (12/11 [id number]) Pulse Rate:  [84-88] 86  (12/11 0633) Resp:  [20] 20  (12/11 0633) BP: (114-136)/(69-74) 122/73 mmHg (12/11 0633) SpO2:  [89 %-100 %] 100 % (12/11 0900) FiO2 (%):  [2 %] 2 % (12/11 [id number]) Weight:  [183 lb 13.8 oz (83.4 kg)] 183 lb 13.8 oz (83.4 kg) (12/11 [id number]) Weight change: -7.1 oz (-0.2 kg) Last BM Date: 09/23/11  Intake/Output from previous day: 12/10 0701 - 12/11 0700 In: 1740 [P.O.:840; I.V.:900] Out: -  Intake/Output this shift: Total I/O In: 240 [P.O.:240] Out: -  Vital Signs: normal  Gen: alert, oriented to person only; tremulous, poor eye contact  Lungs: wheezes throughout all lung fields, transmitted from upper airway; dullness to percussion RLL Cardiac: RRR, no murmurs Extremities: no edema, non-tender, no evidence of DVT  Neuro: PERRLA, head tremor, no focal deficit   Lab Results:  [facility] 09/24/11 1108 09/23/11 0629  WBC 9.6 7.0  HGB 12.2 9.9*  HCT 37.2 31.8*  PLT 253 203   BMET  Basename 09/24/11 1108 09/23/11 0629  NA 133* 135  K 4.5 4.1  CL 100 101  CO2 20 25  GLUCOSE 203* 131*  BUN 16 11  CREATININE 1.01 1.17*  CALCIUM 9.1 8.6    Studies/Results: Ct Head Wo Contrast  09/24/2011   *RADIOLOGY REPORT*  Clinical Data: Altered mental status.  CT HEAD WITHOUT CONTRAST  Technique:  Contiguous axial images were obtained from the base of the skull through the vertex without contrast.  Comparison: Head CT 07/29/2009.  Findings: The ventricles are normal.  No extra-axial fluid collections are seen.  The brainstem and cerebellum are unremarkable.  No acute intracranial findings such as infarction or hemorrhage.  No mass lesions.  The bony calvarium is intact.  The visualized paranasal sinuses and mastoid air cells are clear.  No change since prior examination.  IMPRESSION: No acute intracranial findings or mass lesion.  No change since prior examination.  Original Report Authenticated By: P. Kalman Jewels, M.D.   Dg Pneumonia Chest 2v  09/22/2011  *RADIOLOGY REPORT*  Clinical Data: Cough and shortness of breath.  CHEST - 2 VIEW  Comparison: CT and plain films of the chest 09/08/2010.  Findings: The lungs are clear.  Heart size is normal.  No pneumothorax or pleural effusion.  IMPRESSION: No acute disease.  Original Report Authenticated By: Arvid Right. Luther Parody, M.D.    Medications:  I have reviewed the patient's current medications. Scheduled:    . aerochamber plus with mask- large  1 each Other Once  . aspirin EC  81 mg Oral Daily  . atenolol  50 mg Oral Daily  . azithromycin  500 mg Intravenous Q24H  . benzonatate  200 mg Oral TID  . guaiFENesin  400 mg Oral  Q6H  . heparin  5,000 Units Subcutaneous Q8H  . insulin aspart  0-15 Units Subcutaneous TID WC  . insulin aspart  4 Units Subcutaneous TID WC  . insulin glargine  10 Units Subcutaneous QHS  . pantoprazole  40 mg Oral Daily  . simvastatin  40 mg Oral Daily  . triamterene-hydrochlorothiazide  1 each Oral Daily  . DISCONTD: benzonatate  100 mg Oral TID  . DISCONTD: benzonatate  200 mg Oral TID  . DISCONTD: cefTRIAXone (ROCEPHIN)  IV  1 g Intravenous Q24H  . DISCONTD: guaiFENesin  200 mg Oral Q6H  . DISCONTD: insulin  glargine  5 Units Subcutaneous QHS  . DISCONTD: predniSONE  50 mg Oral Q breakfast   [id number], albuterol, amitriptyline  Assessment/Plan: Melissa [name] is an 77 year old lady with an increased O2 requirement that could be the result of community acquired pneumonia or reactive airway disease, but likely a combination of both.    1. Altered Mental Status -started AM 12/11;  - differential Dx: drug induced (started prednisone 12/10, previously tolerated well per daughter; acute intracranial bleed; metabolic disruption; infectious process - UTI or other occult infection; idiopathic delirium - CBC, BMET, ABG, BNP -stat - CXR, CT   2. Respiratory - Pt currently has a new oxygen requirement with abnormal findings on auscultation; X-ray negative for consolidation ; this is likely a viral URI causing exacerbation of reactive airway disease   12/10 - patient still with O2 requirement and wheezing  - Ceftriaxone 1 mg daily starting 12/9; Azithromycin 500 mg daily x 5 days - started 12/9 - Albuterol nebs q 4 hours scheduled, consider adding steroid q 2 PRN; tessalon perles TID; started guaifenesin  - call Dr. Laqueta Jean for info regarding [place]   12/11 - good O2 sats, continue albuterol nebs, increase guaifenesin   3. Hypertension - continue home meds atenolol, triamterence-HCTZ   4. Hyperlipidemia - continue home statin   5. Diabetes Mellitus - hold home actos; start Lantus @ 5 units q AM, moderate sliding scale insulin  CBG (last 3)   Basename 09/24/11 1058 09/24/11 0648 09/23/11 2036  GLUCAP 186* 175* 188*   - 12/11 - increase Lantus to 10 units   6. Sleep - amitriptyline QHS   7. FENGI: NS w/ KCl @ 75 mL/hr, carb modified diet   8. PPX: heparin 5000 U [place] daily; protonix 40 mg daily       LOS: 2 days   Dewain Penning 09/24/2011, 12:47 PM

## 2011-09-24 NOTE — Progress Notes (Signed)
Inpatient Diabetes Program Recommendations  AACE/ADA: New Consensus Statement on Inpatient Glycemic Control (2009)  Target Ranges:  Prepandial:   less than 140 mg/dL      Peak postprandial:   less than 180 mg/dL (1-2 hours)      Critically ill patients:  140 - 180 mg/dL   Reason for Visit: Some CBG's greater than 180 mg/dl  Inpatient Diabetes Program Recommendations Insulin - Basal: Will need re-evaluation of Lantus dose tomorrow.  Last dose Prednisone today. Insulin - Meal Coverage: Will need re-evaluation for need for meal coverage tomorrow.    Note: Patient scheduled to receive increased dose of Lantus (10 units beginning tonight-- On 09/23/11 recieved 5 units).  Also to receive meal coverage of 4 units with meals beginning today.  (Did not eat lunch, so may receive beginning at lunch.)  Prednisone stopped today, so insulin dosages may need to be titrated back down tomorrow.  Thank you.

## 2011-09-24 NOTE — Progress Notes (Signed)
I have seen and examined this patient with Dr [name].  I agree with their findings and plans as documented in their progress note for today.  Acute Delirium No clear neuro focality on exam other than disorientation. Head CT unremarkable.  Pt had admission Oct 2010 to [facility] with acute intermittent confusion.  CT and MRI brain at that time were unremarkable. EEG at that time was unremarkable. Her mentation improved over the hospitalization.  It was thought the confusion was the result of a recent head concussion.    Will hold prednisone given its temporal relation to this acute cognitive change. Will check urinalysis for evidence of UTI.    Given BNP 3000 ( up from 60 ~ one year ago) will give lasix 40 mg IV x 1.  Check Echocardiogram for ejection fraction assessment.  Will check TSH and R/O MI. Will check venous blood gas to look for hypercapnia.   Pt's dgt staying in room with patient to provide re-orientation. No need for pharmacologic treatment of delirium at this time as patient does not appear to be a threat to her own safety.

## 2011-09-24 NOTE — Progress Notes (Signed)
Patient seen by Dr Erin Hearing today.  See his note for documentation of attending supervision.

## 2011-09-25 DIAGNOSIS — I059 Rheumatic mitral valve disease, unspecified: Secondary | ICD-10-CM

## 2011-09-25 LAB — COMPREHENSIVE METABOLIC PANEL
AST: 15 U/L (ref 0–37)
Albumin: 3.1 g/dL — ABNORMAL LOW (ref 3.5–5.2)
Alkaline Phosphatase: 62 U/L (ref 39–117)
Chloride: 98 mEq/L (ref 96–112)
Potassium: 4.3 mEq/L (ref 3.5–5.1)
Total Bilirubin: 0.2 mg/dL — ABNORMAL LOW (ref 0.3–1.2)
Total Protein: 7 g/dL (ref 6.0–8.3)

## 2011-09-25 LAB — GLUCOSE, CAPILLARY
Glucose-Capillary: 123 mg/dL — ABNORMAL HIGH (ref 70–99)
Glucose-Capillary: 143 mg/dL — ABNORMAL HIGH (ref 70–99)

## 2011-09-25 MED ORDER — GUAIFENESIN 200 MG PO TABS: 400.0000 mg | ORAL_TABLET | Freq: Three times a day (TID) | ORAL | Status: AC

## 2011-09-25 MED ORDER — AZITHROMYCIN 500 MG PO TABS: 500.0000 mg | ORAL_TABLET | Freq: Every day | ORAL | Status: AC

## 2011-09-25 MED ORDER — AZITHROMYCIN 500 MG PO TABS
500.0000 mg | ORAL_TABLET | Freq: Every day | ORAL | Status: DC
  Administered 2011-09-25: 500 mg via ORAL
  Filled 2011-09-25: qty 1

## 2011-09-25 MED ORDER — FLUTICASONE PROPIONATE HFA 44 MCG/ACT IN AERO: 3.0000 | INHALATION_SPRAY | Freq: Two times a day (BID) | RESPIRATORY_TRACT | Status: DC

## 2011-09-25 MED ORDER — ALBUTEROL SULFATE (5 MG/ML) 0.5% IN NEBU: 2.5000 mg | INHALATION_SOLUTION | RESPIRATORY_TRACT | Status: DC | PRN

## 2011-09-25 MED ORDER — GUAIFENESIN 200 MG PO TABS
400.0000 mg | ORAL_TABLET | Freq: Three times a day (TID) | ORAL | Status: DC
  Administered 2011-09-25 (×2): 400 mg via ORAL
  Filled 2011-09-25 (×2): qty 2

## 2011-09-25 MED ORDER — BENZONATATE 200 MG PO CAPS: 200.0000 mg | ORAL_CAPSULE | Freq: Three times a day (TID) | ORAL | Status: AC

## 2011-09-25 MED ORDER — FLUTICASONE PROPIONATE HFA 44 MCG/ACT IN AERO
3.0000 | INHALATION_SPRAY | Freq: Two times a day (BID) | RESPIRATORY_TRACT | Status: DC
  Filled 2011-09-25: qty 10.6

## 2011-09-25 MED ORDER — AEROCHAMBER PLUS W/MASK LARGE MISC: 1.0000 | Freq: Once | Status: DC

## 2011-09-25 NOTE — Progress Notes (Signed)
Daily Progress Note Melissa [name]. [name], M.D., M.B.A  Family Medicine PGY-1 Pager [phone]  Subjective:  Mental Status - Improved, denies confusion, says that she is very tired and has not slept well 2/2 overnight care; Son-in-law Alvester Chou is in the room and believes that she is back to baseline  Respiratory status - denies SOB, complains of persistent productive cough, she has not asked for a nebulizer treatment since yesterday AM; currently without O2   Objective: Vital signs in last 24 hours: Temp:  [97.5 F (36.4 C)-98.7 F (37.1 C)] 97.5 F (36.4 C) (12/12 0528) Pulse Rate:  [80-88] 80  (12/12 0528) Resp:  [18-20] 18  (12/12 0528) BP: (112-126)/(67-78) 112/67 mmHg (12/12 0528) SpO2:  [97 %-99 %] 97 % (12/12 0528) Weight:  [181 lb 6.4 oz (82.283 kg)] 181 lb 6.4 oz (82.283 kg) (12/12 0528) Weight change: -2 lb 7.4 oz (-1.118 kg) Last BM Date: 09/23/11  Intake/Output from previous day: 12/11 0701 - 12/12 0700 In: 720 [P.O.:720] Out: 2100 [Urine:2100] Intake/Output this shift: Total I/O In: -  Out: 300 [Urine:300] Vital Signs: normal  Gen: alert, oriented to person, place, time and situation; still emotionally labile but less so than yesterday  Lungs: expiratory wheezes throughout all lung fields, transmitted from upper airway; dullness to percussion RLL Cardiac: RRR, no murmurs Extremities: no edema, non-tender,  Neuro: PERRLA, head tremor, no focal deficit   Lab Results:  [facility] 09/24/11 1108 09/23/11 0629  WBC 9.6 7.0  HGB 12.2 9.9*  HCT 37.2 31.8*  PLT 253 203   BMET  Basename 09/25/11 0539 09/24/11 1108  NA 135 133*  K 4.3 4.5  CL 98 100  CO2 27 20  GLUCOSE 131* 203*  BUN 25* 16  CREATININE 1.41* 1.01  CALCIUM 9.0 9.1    Cardiac Enzymes:  Lab 09/24/11 1658  CKTOTAL --  CKMB --  CKMBINDEX --  TROPONINI <0.30   Urine dipstick shows positive for glucose.  Micro exam: not done.  BNP >3000  Studies/Results: Ct Head Wo Contrast  09/24/2011   *RADIOLOGY REPORT*  Clinical Data: Altered mental status.  CT HEAD WITHOUT CONTRAST  Technique:  Contiguous axial images were obtained from the base of the skull through the vertex without contrast.  Comparison: Head CT 07/29/2009.  Findings: The ventricles are normal.  No extra-axial fluid collections are seen.  The brainstem and cerebellum are unremarkable.  No acute intracranial findings such as infarction or hemorrhage.  No mass lesions.  The bony calvarium is intact.  The visualized paranasal sinuses and mastoid air cells are clear.  No change since prior examination.  IMPRESSION: No acute intracranial findings or mass lesion.  No change since prior examination.  Original Report Authenticated By: P. Kalman Jewels, M.D.   Dg Chest Port 1 View  09/24/2011  *RADIOLOGY REPORT*  Clinical Data: Pulmonary congestion.  PORTABLE CHEST - 1 VIEW  Comparison: Plain films of the chest 09/22/2011.  Findings: No focal airspace disease is identified with some peribronchial thickening noted.  Heart size is normal.  No pneumothorax or effusion.  IMPRESSION: Bronchitic change without focal process.  Original Report Authenticated By: Arvid Right. Luther Parody, M.D.    Medications:  I have reviewed the patient's current medications. Scheduled:    . aerochamber plus with mask- large  1 each Other Once  . aspirin EC  81 mg Oral Daily  . atenolol  50 mg Oral Daily  . azithromycin  500 mg Intravenous Q24H  . benzonatate  200 mg Oral TID  .  furosemide  40 mg Intravenous Once  . guaiFENesin  400 mg Oral Q6H  . heparin  5,000 Units Subcutaneous Q8H  . insulin aspart  0-15 Units Subcutaneous TID WC  . insulin aspart  4 Units Subcutaneous TID WC  . insulin glargine  10 Units Subcutaneous QHS  . pantoprazole  40 mg Oral Daily  . simvastatin  40 mg Oral Daily  . triamterene-hydrochlorothiazide  1 each Oral Daily  . DISCONTD: guaiFENesin  200 mg Oral Q6H  . DISCONTD: predniSONE  50 mg Oral Q breakfast    [id number], albuterol, amitriptyline  Assessment/Plan: Melissa [name] is an 77 year old lady with an increased O2 requirement that could be the result of community acquired pneumonia or reactive airway disease, but likely a combination of both.    1. Altered Mental Status -started AM 12/11;  - differential Dx: drug induced (started prednisone 12/10, previously tolerated well per daughter; acute intracranial bleed; metabolic disruption; infectious process - UTI or other occult infection; idiopathic delirium  12/12 -  Resolving without clear explanation of source; Not from acute bleed or stroke; no evidence of infection or hypoglycemia ; try to prevent nighttime awakenings by changing medication schedule   2. Respiratory -   12/10 - patient still with O2 requirement and wheezing  - Ceftriaxone 1 mg daily starting 12/9; Azithromycin 500 mg daily x 5 days - started 12/9 - Albuterol nebs q 4 hours scheduled, consider adding steroid q 2 PRN; tessalon perles TID; started guaifenesin  - call Dr. Laqueta Jean for info regarding [place]   12/11 - good O2 sats, continue albuterol nebs, increase guaifenesin , held prednisone   12/12 - Likely multifaceted from cardiac and reactive airway disease, possibly initiated by previous infectious process  - still wheezing but SpO2 97% off of O2;   - consider restarting prednisone or inhaled corticosteroid   3. AKI - increased creatinine from 1 to 1.4 overnight; likely secondary to Lasix and poor PO intake with AMS  - recheck on 12/13   4. Cardiac - evidence of heart failure with elevated BNP  - f/u echo to evaluate cardiac function    3. Hypertension - continue home meds atenolol, triamterence-HCTZ   4. Hyperlipidemia - continue home statin   5. Diabetes Mellitus - hold home actos; start Lantus @ 5 units q AM, moderate sliding scale insulin  CBG (last 3)   Basename 09/25/11 0626 09/24/11 2110 09/24/11 1616  GLUCAP 123* 178* 222*   - 12/11 -  increase Lantus to 10 units   6. Sleep - amitriptyline QHS   7. FENGI: NS w/ KCl @ 75 mL/hr, carb modified diet   8. PPX: heparin 5000 U [place] daily; protonix 40 mg daily       LOS: 3 days   Melissa [name] 09/25/2011, 9:02 AM

## 2011-09-25 NOTE — Progress Notes (Signed)
I discussed with Dr [name].  I agree with their plans documented in their  Note for today.

## 2011-09-25 NOTE — Progress Notes (Signed)
PHARMACIST - PHYSICIAN COMMUNICATION  Family Medicine Service  CONCERNING: Antibiotic IV to Oral Route Change Policy  RECOMMENDATION: This patient is receiving azithromycin by the intravenous route.  Based on criteria approved by the Pharmacy and Therapeutics Committee, the antibiotic is being converted to the equivalent oral dose form.   DESCRIPTION: These criteria include:  Patient being treated for a respiratory tract infection, urinary tract infection, or cellulitis  The patient is not neutropenic and does not exhibit a GI malabsorption state  The patient is eating (either orally or via tube) and/or has been taking other orally administered medications for a least 24 hours  The patient is improving clinically and has a Tmax < 100.5  If you have questions about this conversion, please contact pharmacy.   1)Patient only received 1 dose of ceftriaxone, did you want this continued? 2)Patient is currently on day #4 of azithromycin, a stop date is in place for a total of 10 days of therapy to be administered, consider adjusting to a 5 day course.  Thanks, Erin Hearing PharmD.

## 2011-09-25 NOTE — Progress Notes (Signed)
  Echocardiogram 2D Echocardiogram has been performed.  Dozier, RDCS 09/25/2011, 1:22 PM

## 2011-09-26 ENCOUNTER — Telehealth: Payer: Self-pay | Admitting: *Deleted

## 2011-09-26 DIAGNOSIS — N179 Acute kidney failure, unspecified: Secondary | ICD-10-CM | POA: Diagnosis not present

## 2011-09-26 DIAGNOSIS — I519 Heart disease, unspecified: Secondary | ICD-10-CM | POA: Diagnosis present

## 2011-09-26 DIAGNOSIS — R4182 Altered mental status, unspecified: Secondary | ICD-10-CM | POA: Diagnosis not present

## 2011-09-26 LAB — GLUCOSE, CAPILLARY: Glucose-Capillary: 102 mg/dL — ABNORMAL HIGH (ref 70–99)

## 2011-09-26 NOTE — Discharge Summary (Signed)
Physician Discharge Summary  Patient ID: Melissa [name] MRN: [id number] DOB/AGE: August 16, 1928 77 y.o. PCP: [facility];    Admit date: 09/22/2011 Discharge date: 09/25/11  Admission Diagnoses: Hypoxia   Discharge Diagnoses:  Principal Problem:  *Asthma exacerbation Active Problems:  Diabetes mellitus type II  HTN (hypertension)  HLD (hyperlipidemia)  Left ventricular dysfunction  Acute kidney injury Hypoxia, resolved Altered Mental Status Resolved   Discharged Condition: stable  Hospital Course:  Melissa [name] is an 77 year old lady with a new O2 requirement and persistent cough x 5 days duration, who failed outpatient management with amoxicillin.    1. Altered Mental Status  -started AM 12/11, patient was disoriented, withdrawn, hyper-religious and with a not previously seen head tremor; Her daughter stated that the patient called her children this morning and told them that she thinks she is going to die. Her daughter states that she has had several episodes like this in the past two years since she sustained a head injury. They usually resolved within several hours. Previous workups have been negative for intracranial source. - Differential Dx: drug induced - started prednisone 12/10, previously tolerated well per daughter; acute intracranial bleed; metabolic disruption; infectious process - UTI or other occult infection; delirium  - Workup rule with CT head ruled out intracranial bleed or mass; no evidence of infection of hypoglycemia; only abnormal lab value was an elevated Pro-BNP > 3000 (peviously 50) - This episode resolved spontaneously, and she was appropriate on day of discharge   2. Respiratory -  - patient arrives with persistent cough and new O2 requirement; presumed to be CAP, so started on Ceftriaxone and Azithromycni;  Chest x-ray failed to reveal focal consolidation and no leukocytosis or fever - so Ceftriaxone d/c'd after 2 doses - patient still with O2  requirement and wheezing on day 2 so concerned that this was asthma exacerbation, so started on Prednisone with albuterol nebs; however prednisone held when patient became altered, although the prednisone was more likely coincidental and non-contributory   - after Pro-BNP elevated, concern that hypoxia due in part to pulmonary edema, so given Lasix x 1  - On day of discharge, hypoxia has resolved but still wheezing persistently and coughing; this exacerbation of asthma is likely due to cardiac and infectious factors; she will need better asthma controlled medications and will be discharged on oral steroids    3. AKI -   - increased creatinine from 1 to 1.4 overnight 12/12; likely secondary to Lasix and poor PO intake with AMS, needs to be rechecked in outpatient   4. Cardiac  - evidence of heart failure with elevated BNP; cardiac echo showed mild pulmonary hypertension, LV hypokinesis and EF 45-50%;   5. Hypertension - continue home meds atenolol, triamterence-HCTZ   6. Hyperlipidemia - continue home statin   7. Diabetes Mellitus - hold home actos; start Lantus, moderate sliding scale insulin  CBG (last 3)   Basename  09/25/11 0626  09/24/11 2110  09/24/11 1616   GLUCAP  123*  178*  222*     8. Sleep - amitriptyline QHS     Consults: none  Significant Diagnostic Studies:   Pro BNP: 3031.0   *RADIOLOGY REPORT*  Clinical Data: Cough and shortness of breath.  CHEST - 2 VIEW  Comparison: CT and plain films of the chest 09/08/2010.  Findings: The lungs are clear. Heart size is normal. No  pneumothorax or pleural effusion.  IMPRESSION:  No acute disease.   CT HEAD WITHOUT  CONTRAST  Technique: Contiguous axial images were obtained from the base of  the skull through the vertex without contrast.  Comparison: Head CT 07/29/2009.  Findings: The ventricles are normal. No extra-axial fluid  collections are seen. The brainstem and cerebellum are  unremarkable. No acute intracranial  findings such as infarction or  hemorrhage. No mass lesions.  The bony calvarium is intact. The visualized paranasal sinuses and  mastoid air cells are clear.  No change since prior examination.  IMPRESSION:  No acute intracranial findings or mass lesion. No change since  prior examination.    2D Echo:  ------------------------------------------------------------ Study Conclusions  - Left ventricle: The cavity size was normal. Wall thickness was normal. Systolic function was mildly reduced. The estimated ejection fraction was in the range of 45% to 50%. Diffuse hypokinesis. - Mitral valve: Mild to moderate regurgitation directed posteriorly. - Left atrium: The atrium was mildly dilated. Impressions:  - The right ventricular systolic pressure was increased consistent with mild pulmonary hypertension. Transthoracic echocardiography. M-mode, complete 2D, spectral Doppler, and color Doppler. Height: Height: 165.1cm. Height: 65in. Weight: Weight: 82.3kg. Weight: 181.1lb. Body mass index: BMI: 30.2kg/m^2. Body surface area: BSA: 1.74m^2. Blood pressure: 112/67. Patient status: Inpatient. Location: Echo laboratory.   Follow-Up Issues: 1. Asthma Management 2. Acute Kidney Injury   Discharge Exam: Blood pressure 138/78, pulse 86, temperature 97.7 F (36.5 C), temperature source Oral, resp. rate 18, height 5\' 5"  (1.651 m), weight 181 lb 6.4 oz (82.283 kg), SpO2 94.00%. on room air  Vital Signs: normal  Gen: alert, oriented to person, place, time and situation; still emotionally labile but less so than yesterday  Lungs: expiratory wheezes throughout all lung fields, transmitted from upper airway; dullness to percussion RLL  Cardiac: RRR, no murmurs  Extremities: no edema, non-tender,  Neuro: PERRLA, head tremor, no focal deficit    Disposition: Home or Self Care   Discharge Medication List as of 09/25/2011  9:29 PM    START taking these medications   Details  albuterol  (PROVENTIL) (5 MG/ML) 0.5% nebulizer solution Take 0.5 mLs (2.5 mg total) by nebulization every 4 (four) hours as needed for wheezing or shortness of breath., Starting 09/25/2011, Until Thu 09/24/12, Normal    azithromycin (ZITHROMAX) 500 MG tablet Take 1 tablet (500 mg total) by mouth daily., Starting 09/25/2011, Until Mon 09/30/11, Normal    benzonatate (TESSALON) 200 MG capsule Take 1 capsule (200 mg total) by mouth 3 (three) times daily., Starting 09/25/2011, Until Wed 10/02/11, Normal    fluticasone (FLOVENT HFA) 44 MCG/ACT inhaler Inhale 3 puffs into the lungs 2 (two) times daily., Starting 09/25/2011, Until Thu 09/24/12, Normal    guaiFENesin 200 MG tablet Take 2 tablets (400 mg total) by mouth 3 (three) times daily., Starting 09/25/2011, Until Sat 10/05/11, Normal    Spacer/Aero-Holding Chambers (AEROCHAMBER PLUS WITH MASK- LARGE) MISC 1 each by Other route once., Starting 09/25/2011, Until Discontinued, Normal      CONTINUE these medications which have NOT CHANGED   Details  amitriptyline (ELAVIL) 10 MG tablet Take 20 mg by mouth at bedtime as needed. sleep, Until Discontinued, Historical Med    aspirin EC 81 MG tablet Take 81 mg by mouth daily.  , Until Discontinued, Historical Med    atenolol (TENORMIN) 50 MG tablet Take 50 mg by mouth daily.  , Until Discontinued, Historical Med    atorvastatin (LIPITOR) 20 MG tablet Take 20 mg by mouth daily.  , Until Discontinued, Historical Med    esomeprazole ([place]) 40  MG capsule Take 40 mg by mouth 2 (two) times daily.  , Until Discontinued, Historical Med    OVER THE COUNTER MEDICATION Place 1 drop into both eyes every morning. Patient uses over the counter eye drop that begins with a "s". , Until Discontinued, Historical Med    triamterene-hydrochlorothiazide (MAXZIDE-25) 37.5-25 MG per tablet Take 1 tablet by mouth daily.  , Until Discontinued, Historical Med      STOP taking these medications     pioglitazone (ACTOS) 45 MG  tablet          Signed: Dewain Penning 09/26/2011, 11:39 PM

## 2011-09-26 NOTE — Telephone Encounter (Signed)
Patient discharged from hospital and received refill requests yesterday for Albuterol nebulizer concentrated solution.  Patient usually receives prediluted vials.  Ok per preceptor (Dr. Andria Frames) to fill Rx for prediluted vials.  Nolene Ebbs, RN

## 2011-09-27 NOTE — Discharge Summary (Signed)
I discussed with Dr [name].  I agree with their plans documented in their  Note for today.

## 2011-09-30 ENCOUNTER — Ambulatory Visit (INDEPENDENT_AMBULATORY_CARE_PROVIDER_SITE_OTHER): Payer: Medicare Other

## 2011-09-30 DIAGNOSIS — N189 Chronic kidney disease, unspecified: Secondary | ICD-10-CM

## 2011-09-30 DIAGNOSIS — I509 Heart failure, unspecified: Secondary | ICD-10-CM

## 2011-09-30 DIAGNOSIS — E119 Type 2 diabetes mellitus without complications: Secondary | ICD-10-CM

## 2011-09-30 DIAGNOSIS — J189 Pneumonia, unspecified organism: Secondary | ICD-10-CM

## 2011-10-24 DIAGNOSIS — J45909 Unspecified asthma, uncomplicated: Secondary | ICD-10-CM | POA: Diagnosis not present

## 2011-10-24 DIAGNOSIS — F411 Generalized anxiety disorder: Secondary | ICD-10-CM | POA: Diagnosis not present

## 2011-10-24 DIAGNOSIS — R4182 Altered mental status, unspecified: Secondary | ICD-10-CM | POA: Diagnosis not present

## 2011-10-24 DIAGNOSIS — I509 Heart failure, unspecified: Secondary | ICD-10-CM | POA: Diagnosis not present

## 2011-10-24 DIAGNOSIS — N179 Acute kidney failure, unspecified: Secondary | ICD-10-CM | POA: Diagnosis not present

## 2011-10-24 DIAGNOSIS — R51 Headache: Secondary | ICD-10-CM | POA: Diagnosis not present

## 2011-11-06 DIAGNOSIS — I519 Heart disease, unspecified: Secondary | ICD-10-CM | POA: Diagnosis not present

## 2011-11-06 DIAGNOSIS — N183 Chronic kidney disease, stage 3 unspecified: Secondary | ICD-10-CM | POA: Diagnosis not present

## 2011-11-06 DIAGNOSIS — R0789 Other chest pain: Secondary | ICD-10-CM | POA: Diagnosis not present

## 2011-11-06 DIAGNOSIS — E119 Type 2 diabetes mellitus without complications: Secondary | ICD-10-CM | POA: Diagnosis not present

## 2011-11-06 DIAGNOSIS — N184 Chronic kidney disease, stage 4 (severe): Secondary | ICD-10-CM | POA: Diagnosis not present

## 2011-11-06 DIAGNOSIS — R0609 Other forms of dyspnea: Secondary | ICD-10-CM | POA: Diagnosis not present

## 2011-11-08 DIAGNOSIS — E1165 Type 2 diabetes mellitus with hyperglycemia: Secondary | ICD-10-CM | POA: Diagnosis not present

## 2011-11-08 DIAGNOSIS — R0789 Other chest pain: Secondary | ICD-10-CM | POA: Diagnosis not present

## 2011-11-08 DIAGNOSIS — R0609 Other forms of dyspnea: Secondary | ICD-10-CM | POA: Diagnosis not present

## 2011-11-08 DIAGNOSIS — R0989 Other specified symptoms and signs involving the circulatory and respiratory systems: Secondary | ICD-10-CM | POA: Diagnosis not present

## 2011-11-08 DIAGNOSIS — E1129 Type 2 diabetes mellitus with other diabetic kidney complication: Secondary | ICD-10-CM | POA: Diagnosis not present

## 2012-02-05 DIAGNOSIS — N184 Chronic kidney disease, stage 4 (severe): Secondary | ICD-10-CM | POA: Diagnosis not present

## 2012-02-05 DIAGNOSIS — I10 Essential (primary) hypertension: Secondary | ICD-10-CM | POA: Diagnosis not present

## 2012-02-05 DIAGNOSIS — R000 Tachycardia, unspecified: Secondary | ICD-10-CM | POA: Diagnosis not present

## 2012-02-05 DIAGNOSIS — E785 Hyperlipidemia, unspecified: Secondary | ICD-10-CM | POA: Diagnosis not present

## 2012-02-05 DIAGNOSIS — I509 Heart failure, unspecified: Secondary | ICD-10-CM | POA: Diagnosis not present

## 2012-02-07 ENCOUNTER — Encounter (HOSPITAL_COMMUNITY): Payer: Self-pay | Admitting: Emergency Medicine

## 2012-02-07 ENCOUNTER — Emergency Department (HOSPITAL_COMMUNITY)
Admission: EM | Admit: 2012-02-07 | Discharge: 2012-02-08 | Disposition: A | Discharge status: Home / Self Care | Payer: Medicare Other | Attending: Emergency Medicine | Admitting: Emergency Medicine

## 2012-02-07 DIAGNOSIS — J45909 Unspecified asthma, uncomplicated: Secondary | ICD-10-CM | POA: Insufficient documentation

## 2012-02-07 DIAGNOSIS — E119 Type 2 diabetes mellitus without complications: Secondary | ICD-10-CM | POA: Diagnosis not present

## 2012-02-07 DIAGNOSIS — I509 Heart failure, unspecified: Secondary | ICD-10-CM | POA: Diagnosis not present

## 2012-02-07 DIAGNOSIS — E1129 Type 2 diabetes mellitus with other diabetic kidney complication: Secondary | ICD-10-CM | POA: Diagnosis not present

## 2012-02-07 DIAGNOSIS — N184 Chronic kidney disease, stage 4 (severe): Secondary | ICD-10-CM | POA: Diagnosis not present

## 2012-02-07 DIAGNOSIS — R739 Hyperglycemia, unspecified: Secondary | ICD-10-CM

## 2012-02-07 DIAGNOSIS — E871 Hypo-osmolality and hyponatremia: Secondary | ICD-10-CM | POA: Diagnosis not present

## 2012-02-07 DIAGNOSIS — E1165 Type 2 diabetes mellitus with hyperglycemia: Secondary | ICD-10-CM | POA: Diagnosis not present

## 2012-02-07 DIAGNOSIS — N390 Urinary tract infection, site not specified: Secondary | ICD-10-CM

## 2012-02-07 LAB — POCT I-STAT, CHEM 8
BUN: 22 mg/dL (ref 6–23)
Creatinine, Ser: 1.4 mg/dL — ABNORMAL HIGH (ref 0.50–1.10)
Glucose, Bld: 355 mg/dL — ABNORMAL HIGH (ref 70–99)
Hemoglobin: 13.3 g/dL (ref 12.0–15.0)
Potassium: 3.8 mEq/L (ref 3.5–5.1)
Sodium: 135 mEq/L (ref 135–145)

## 2012-02-07 LAB — URINE MICROSCOPIC-ADD ON

## 2012-02-07 LAB — URINALYSIS, ROUTINE W REFLEX MICROSCOPIC
Bilirubin Urine: NEGATIVE
Glucose, UA: >1000 mg/dL — AB
Hgb urine dipstick: NEGATIVE
Specific Gravity, Urine: 1.011 (ref 1.005–1.030)
Urobilinogen, UA: 0.2 mg/dL (ref 0.0–1.0)

## 2012-02-07 MED ORDER — SODIUM CHLORIDE 0.9 % IV BOLUS (SEPSIS)
500.0000 mL | Freq: Once | INTRAVENOUS | Status: AC
  Administered 2012-02-07: 500 mL via INTRAVENOUS

## 2012-02-07 MED ORDER — INSULIN REGULAR HUMAN 100 UNIT/ML IJ SOLN: 6.0000 [IU] | Freq: Once | INTRAMUSCULAR | Status: DC

## 2012-02-07 MED ORDER — SITAGLIPTIN PHOSPHATE 50 MG PO TABS: 50.0000 mg | ORAL_TABLET | Freq: Every day | ORAL | Status: DC

## 2012-02-07 MED ORDER — INSULIN ASPART 100 UNIT/ML ~~LOC~~ SOLN
6.0000 [IU] | Freq: Once | SUBCUTANEOUS | Status: AC
  Administered 2012-02-07: 6 [IU] via SUBCUTANEOUS
  Filled 2012-02-07: qty 1

## 2012-02-07 MED ORDER — SULFAMETHOXAZOLE-TRIMETHOPRIM 800-160 MG PO TABS: 1.0000 | ORAL_TABLET | Freq: Two times a day (BID) | ORAL | Status: AC

## 2012-02-07 NOTE — ED Provider Notes (Signed)
History     CSN: [id number]  Arrival date & time 02/07/12  2128   First MD Initiated Contact with Patient 02/07/12 2144      Chief Complaint  Patient presents with  . Hyperglycemia    (Consider location/radiation/quality/duration/timing/severity/associated sxs/prior treatment) HPI Pt had been on diabetic medications but was taken off of them after a hospitalization in December.  Pt has been having gradually increasing blood sugar levels.  She has been up to the 300s and 400s.  Pt went to the [facility] walk in clinic and they sent her to the ED because of her PMHX.  Pt denies vomiting, diarrhea, or fever.  No pain.  Previously she has been on actos and metformin. Past Medical History  Diagnosis Date  . Asthma   . Sleep apnea   . Angina   . Hypertension   . Blood transfusion   . GERD (gastroesophageal reflux disease)   . Headache   . Arthritis   . Anxiety   . Pneumonia   . Diabetes mellitus   . Anemia   . Hiatal hernia     Past Surgical History  Procedure Date  . Abdominal hysterectomy   . Appendectomy   . Eye surgery   . Tonsillectomy     No family history on file.  History  Substance Use Topics  . Smoking status: Former Smoker    Quit date: 09/21/1961  . Smokeless tobacco: Not on file  . Alcohol Use: No    OB History    Grav Para Term Preterm Abortions TAB SAB Ect Mult Living                  Review of Systems  All other systems reviewed and are negative.    Allergies  Review of patient's allergies indicates no known allergies.  Home Medications   Current Outpatient Rx  Name Route Sig Dispense Refill  . ALBUTEROL SULFATE (5 MG/ML) 0.5% IN NEBU Nebulization Take 0.5 mLs (2.5 mg total) by nebulization every 4 (four) hours as needed for wheezing or shortness of breath. 20 mL 1  . AMITRIPTYLINE HCL 10 MG PO TABS Oral Take 20 mg by mouth at bedtime as needed. sleep    . ASPIRIN EC 81 MG PO TBEC Oral Take 81 mg by mouth daily.      . ATENOLOL 100 MG PO  TABS Oral Take 100 mg by mouth daily.    . ATORVASTATIN CALCIUM 20 MG PO TABS Oral Take 20 mg by mouth daily.      Marland Kitchen ESOMEPRAZOLE MAGNESIUM 40 MG PO CPDR Oral Take 40 mg by mouth 2 (two) times daily.      Marland Kitchen FLUTICASONE PROPIONATE  HFA 44 MCG/ACT IN AERO Inhalation Inhale 3 puffs into the lungs 2 (two) times daily. 1 Inhaler 1  . OVER THE COUNTER MEDICATION Both Eyes Place 1 drop into both eyes every morning. Patient uses over the counter eye drop that begins with a "s".     . TRIAMTERENE-HCTZ 37.5-25 MG PO TABS Oral Take 1 tablet by mouth daily.      . AEROCHAMBER PLUS W/MASK LARGE MISC Other 1 each by Other route once. 1 each 0    BP 141/63  Pulse 89  Temp 98 F (36.7 C)  Resp 16  Wt 180 lb (81.647 kg)  SpO2 100%  Physical Exam  Nursing note and vitals reviewed. Constitutional: She appears well-developed and well-nourished. No distress.  HENT:  Head: Normocephalic and atraumatic.  Right Ear:  External ear normal.  Left Ear: External ear normal.  Eyes: Conjunctivae are normal. Right eye exhibits no discharge. Left eye exhibits no discharge. No scleral icterus.  Neck: Neck supple. No tracheal deviation present.  Cardiovascular: Normal rate, regular rhythm and intact distal pulses.   Pulmonary/Chest: Effort normal and breath sounds normal. No stridor. No respiratory distress. She has no wheezes. She has no rales.  Abdominal: Soft. Bowel sounds are normal. She exhibits no distension. There is no tenderness. There is no rebound and no guarding.  Musculoskeletal: She exhibits no edema and no tenderness.  Neurological: She is alert. She has normal strength. No sensory deficit. Cranial nerve deficit:  no gross defecits noted. She exhibits normal muscle tone. She displays no seizure activity. Coordination normal.  Skin: Skin is warm and dry. No rash noted.  Psychiatric: She has a normal mood and affect.    ED Course  Procedures (including critical care time)  Labs Reviewed  GLUCOSE,  CAPILLARY - Abnormal; Notable for the following:    Glucose-Capillary 302 (*)    All other components within normal limits  URINALYSIS, ROUTINE W REFLEX MICROSCOPIC - Abnormal; Notable for the following:    Glucose, UA >1000 (*)    Nitrite POSITIVE (*)    Leukocytes, UA TRACE (*)    All other components within normal limits  POCT I-STAT, CHEM 8 - Abnormal; Notable for the following:    Creatinine, Ser 1.40 (*)    Glucose, Bld 355 (*)    All other components within normal limits  URINE MICROSCOPIC-ADD ON - Abnormal; Notable for the following:    Bacteria, UA MANY (*)    All other components within normal limits   No results found.   1. Hyperglycemia   2. UTI (lower urinary tract infection)       MDM  Pt without signs of DKA.  May have a UTI which could be contributing to her high blood sugar however daughter states she has been running high for months.  With her complications with previous oral meds will start her on Januvia.  Will adjust for her renal insuff.  Cr clearance is less than 50.  Pt has plans to follow up with PCP this coming week.      Kathalene Frames, MD 02/07/12 [phone]

## 2012-02-07 NOTE — Discharge Instructions (Signed)
Diabetes, Frequently Asked Questions WHAT IS DIABETES? Most of the food we eat is turned into glucose (sugar). Our bodies use it for energy. The pancreas makes a hormone called insulin. It helps glucose get into the cells of our bodies. When you have diabetes, your body either does not make enough insulin or cannot use its own insulin as well as it should. This causes sugars to build up in your blood. WHAT ARE THE SYMPTOMS OF DIABETES?  Frequent urination.   Excessive thirst.   Unexplained weight loss.   Extreme hunger.   Blurred vision.   Tingling or numbness in hands or feet.   Feeling very tired much of the time.   Dry, itchy skin.   Sores that are slow to heal.   Yeast infections.  WHAT ARE THE TYPES OF DIABETES? Type 1 Diabetes   About 10% of affected people have this type.   Usually occurs before the age of 86.   Usually occurs in thin to normal weight people.  Type 2 Diabetes  About 90% of affected people have this type.   Usually occurs after the age of 74.   Usually occurs in overweight people.   More likely to have:   A family history of diabetes.   A history of diabetes during pregnancy (gestational diabetes).   High blood pressure.   High cholesterol and triglycerides.  Gestational Diabetes  Occurs in about 4% of pregnancies.   Usually goes away after the baby is born.   More likely to occur in women with:   Family history of diabetes.   Previous gestational diabetes.   Obese.   Over 46 years old.  WHAT IS PRE-DIABETES? Pre-diabetes means your blood glucose is higher than normal, but lower than the diabetes range. It also means you are at risk of getting type 2 diabetes and heart disease. If you are told you have pre-diabetes, have your blood glucose checked again in 1 to 2 years. WHAT IS THE TREATMENT FOR DIABETES? Treatment is aimed at keeping blood glucose near normal levels at all times. Learning how to manage this yourself is  important in treating diabetes. Depending on the type of diabetes you have, your treatment will include one or more of the following:  Monitoring your blood glucose.   Meal planning.   Exercise.   Oral medicine (pills) or insulin.  CAN DIABETES BE PREVENTED? With type 1 diabetes, prevention is more difficult, because the triggers that cause it are not yet known. With type 2 diabetes, prevention is more likely, with lifestyle changes:  Maintain a healthy weight.   Eat healthy.   Exercise.  IS THERE A CURE FOR DIABETES? No, there is no cure for diabetes. There is a lot of research going on that is looking for a cure, and progress is being made. Diabetes can be treated and controlled. People with diabetes can manage their diabetes and lead normal, active lives. SHOULD I BE TESTED FOR DIABETES? If you are at least 76 years old, you should be tested for diabetes. You should be tested again every 3 years. If you are 31 or older and overweight, you may want to get tested more often. If you are younger than 79, overweight, and have one or more of the following risk factors, you should be tested:  Family history of diabetes.   Inactive lifestyle.   High blood pressure.  WHAT ARE SOME OTHER SOURCES FOR INFORMATION ON DIABETES? The following organizations may help in your search for  more information on diabetes: National Diabetes Education Program (NDEP) Internet: BloggerBowl.es American Diabetes Association Internet: http://www.diabetes.org  Juvenile Diabetes Foundation International Internet: AffordableSalon.es Document Released: 10/03/2003 Document Revised: 09/19/2011 Document Reviewed: 07/28/2009 [facility] Patient Information 2012 [place].Hyperglycemia Hyperglycemia occurs when the glucose (sugar) in your blood is too high. Hyperglycemia can happen for many reasons, but it most often happens to people who do not know they have diabetes or are not managing  their diabetes properly.  CAUSES  Whether you have diabetes or not, there are other causes of hyperglycemia. Hyperglycemia can occur when you have diabetes, but it can also occur in other situations that you might not be as aware of, such as: Diabetes  If you have diabetes and are having problems controlling your blood glucose, hyperglycemia could occur because of some of the following reasons:   Not following your meal plan.   Not taking your diabetes medications or not taking it properly.   Exercising less or doing less activity than you normally do.   Being sick.  Pre-diabetes  This cannot be ignored. Before people develop Type 2 diabetes, they almost always have "pre-diabetes." This is when your blood glucose levels are higher than normal, but not yet high enough to be diagnosed as diabetes. Research has shown that some long-term damage to the body, especially the heart and circulatory system, may already be occurring during pre-diabetes. If you take action to manage your blood glucose when you have pre-diabetes, you may delay or prevent Type 2 diabetes from developing.  Stress  If you have diabetes, you may be "diet" controlled or on oral medications or insulin to control your diabetes. However, you may find that your blood glucose is higher than usual in the hospital whether you have diabetes or not. This is often referred to as "stress hyperglycemia." Stress can elevate your blood glucose. This happens because of hormones put out by the body during times of stress. If stress has been the cause of your high blood glucose, it can be followed regularly by your caregiver. That way he/she can make sure your hyperglycemia does not continue to get worse or progress to diabetes.  Steroids  Steroids are medications that act on the infection fighting system (immune system) to block inflammation or infection. One side effect can be a rise in blood glucose. Most people can produce enough extra  insulin to allow for this rise, but for those who cannot, steroids make blood glucose levels go even higher. It is not unusual for steroid treatments to "uncover" diabetes that is developing. It is not always possible to determine if the hyperglycemia will go away after the steroids are stopped. A special blood test called an A1c is sometimes done to determine if your blood glucose was elevated before the steroids were started.  SYMPTOMS  Thirsty.   Frequent urination.   Dry mouth.   Blurred vision.   Tired or fatigue.   Weakness.   Sleepy.   Tingling in feet or leg.  DIAGNOSIS  Diagnosis is made by monitoring blood glucose in one or all of the following ways:  A1c test. This is a chemical found in your blood.   Fingerstick blood glucose monitoring.   Laboratory results.  TREATMENT  First, knowing the cause of the hyperglycemia is important before the hyperglycemia can be treated. Treatment may include, but is not be limited to:  Education.   Change or adjustment in medications.   Change or adjustment in meal plan.   Treatment for  an illness, infection, etc.   More frequent blood glucose monitoring.   Change in exercise plan.   Decreasing or stopping steroids.   Lifestyle changes.  HOME CARE INSTRUCTIONS   Test your blood glucose as directed.   Exercise regularly. Your caregiver will give you instructions about exercise. Pre-diabetes or diabetes which comes on with stress is helped by exercising.   Eat wholesome, balanced meals. Eat often and at regular, fixed times. Your caregiver or nutritionist will give you a meal plan to guide your sugar intake.   Being at an ideal weight is important. If needed, losing as little as 10 to 15 pounds may help improve blood glucose levels.  SEEK MEDICAL CARE IF:   You have questions about medicine, activity, or diet.   You continue to have symptoms (problems such as increased thirst, urination, or weight gain).  SEEK  IMMEDIATE MEDICAL CARE IF:   You are vomiting or have diarrhea.   Your breath smells fruity.   You are breathing faster or slower.   You are very sleepy or incoherent.   You have numbness, tingling, or pain in your feet or hands.   You have chest pain.   Your symptoms get worse even though you have been following your caregiver's orders.   If you have any other questions or concerns.  Document Released: 03/26/2001 Document Revised: 09/19/2011 Document Reviewed: 05/22/2009 [facility] Patient Information 2012 [place].

## 2012-02-07 NOTE — ED Notes (Signed)
Pt alert, nad, c/o high bs, onset today, cotacted PCP, went to urgent care, instructed to come to ED, resp even unlabored, skin pwd, speech clear

## 2012-02-08 NOTE — ED Notes (Signed)
Patient ambulatory with a steady gait. Respirations equal and unlabored. Skin warm and dry. No acute distress noted.

## 2012-02-11 DIAGNOSIS — N390 Urinary tract infection, site not specified: Secondary | ICD-10-CM | POA: Diagnosis not present

## 2012-02-11 DIAGNOSIS — N184 Chronic kidney disease, stage 4 (severe): Secondary | ICD-10-CM | POA: Diagnosis not present

## 2012-02-11 DIAGNOSIS — E785 Hyperlipidemia, unspecified: Secondary | ICD-10-CM | POA: Diagnosis not present

## 2012-02-11 DIAGNOSIS — E1165 Type 2 diabetes mellitus with hyperglycemia: Secondary | ICD-10-CM | POA: Diagnosis not present

## 2012-02-11 DIAGNOSIS — E1129 Type 2 diabetes mellitus with other diabetic kidney complication: Secondary | ICD-10-CM | POA: Diagnosis not present

## 2012-02-17 DIAGNOSIS — E1165 Type 2 diabetes mellitus with hyperglycemia: Secondary | ICD-10-CM | POA: Diagnosis not present

## 2012-02-17 DIAGNOSIS — N184 Chronic kidney disease, stage 4 (severe): Secondary | ICD-10-CM | POA: Diagnosis not present

## 2012-02-17 DIAGNOSIS — E1129 Type 2 diabetes mellitus with other diabetic kidney complication: Secondary | ICD-10-CM | POA: Diagnosis not present

## 2012-02-28 DIAGNOSIS — F3289 Other specified depressive episodes: Secondary | ICD-10-CM | POA: Diagnosis not present

## 2012-02-28 DIAGNOSIS — H6120 Impacted cerumen, unspecified ear: Secondary | ICD-10-CM | POA: Diagnosis not present

## 2012-02-28 DIAGNOSIS — Z0000 Encounter for general adult medical examination without abnormal findings: Secondary | ICD-10-CM | POA: Diagnosis not present

## 2012-02-28 DIAGNOSIS — F329 Major depressive disorder, single episode, unspecified: Secondary | ICD-10-CM | POA: Diagnosis not present

## 2012-02-28 DIAGNOSIS — Z23 Encounter for immunization: Secondary | ICD-10-CM | POA: Diagnosis not present

## 2012-02-28 DIAGNOSIS — E1129 Type 2 diabetes mellitus with other diabetic kidney complication: Secondary | ICD-10-CM | POA: Diagnosis not present

## 2012-03-04 DIAGNOSIS — R000 Tachycardia, unspecified: Secondary | ICD-10-CM | POA: Diagnosis not present

## 2012-03-04 DIAGNOSIS — R0609 Other forms of dyspnea: Secondary | ICD-10-CM | POA: Diagnosis not present

## 2012-03-04 DIAGNOSIS — R0989 Other specified symptoms and signs involving the circulatory and respiratory systems: Secondary | ICD-10-CM | POA: Diagnosis not present

## 2012-03-04 DIAGNOSIS — H6120 Impacted cerumen, unspecified ear: Secondary | ICD-10-CM | POA: Diagnosis not present

## 2012-03-04 DIAGNOSIS — I509 Heart failure, unspecified: Secondary | ICD-10-CM | POA: Diagnosis not present

## 2012-03-04 DIAGNOSIS — Z1211 Encounter for screening for malignant neoplasm of colon: Secondary | ICD-10-CM | POA: Diagnosis not present

## 2012-03-13 ENCOUNTER — Other Ambulatory Visit (HOSPITAL_COMMUNITY): Payer: Self-pay | Admitting: Family Medicine

## 2012-03-13 DIAGNOSIS — Z1231 Encounter for screening mammogram for malignant neoplasm of breast: Secondary | ICD-10-CM

## 2012-03-24 DIAGNOSIS — I10 Essential (primary) hypertension: Secondary | ICD-10-CM | POA: Diagnosis not present

## 2012-03-24 DIAGNOSIS — K279 Peptic ulcer, site unspecified, unspecified as acute or chronic, without hemorrhage or perforation: Secondary | ICD-10-CM | POA: Diagnosis not present

## 2012-03-24 DIAGNOSIS — R32 Unspecified urinary incontinence: Secondary | ICD-10-CM | POA: Diagnosis not present

## 2012-03-24 DIAGNOSIS — N184 Chronic kidney disease, stage 4 (severe): Secondary | ICD-10-CM | POA: Diagnosis not present

## 2012-03-24 DIAGNOSIS — E1129 Type 2 diabetes mellitus with other diabetic kidney complication: Secondary | ICD-10-CM | POA: Diagnosis not present

## 2012-03-24 DIAGNOSIS — N3000 Acute cystitis without hematuria: Secondary | ICD-10-CM | POA: Diagnosis not present

## 2012-03-24 DIAGNOSIS — E1165 Type 2 diabetes mellitus with hyperglycemia: Secondary | ICD-10-CM | POA: Diagnosis not present

## 2012-03-24 DIAGNOSIS — G609 Hereditary and idiopathic neuropathy, unspecified: Secondary | ICD-10-CM | POA: Diagnosis not present

## 2012-04-06 ENCOUNTER — Ambulatory Visit (HOSPITAL_COMMUNITY)
Admission: RE | Admit: 2012-04-06 | Discharge: 2012-04-06 | Disposition: A | Discharge status: Home / Self Care | Payer: Medicare Other | Source: Ambulatory Visit | Attending: Family Medicine | Admitting: Family Medicine

## 2012-04-06 DIAGNOSIS — Z1231 Encounter for screening mammogram for malignant neoplasm of breast: Secondary | ICD-10-CM | POA: Diagnosis not present

## 2012-04-10 DIAGNOSIS — E1129 Type 2 diabetes mellitus with other diabetic kidney complication: Secondary | ICD-10-CM | POA: Diagnosis not present

## 2012-04-10 DIAGNOSIS — E785 Hyperlipidemia, unspecified: Secondary | ICD-10-CM | POA: Diagnosis not present

## 2012-04-10 DIAGNOSIS — N184 Chronic kidney disease, stage 4 (severe): Secondary | ICD-10-CM | POA: Diagnosis not present

## 2012-07-21 DIAGNOSIS — I10 Essential (primary) hypertension: Secondary | ICD-10-CM | POA: Diagnosis not present

## 2012-07-21 DIAGNOSIS — N184 Chronic kidney disease, stage 4 (severe): Secondary | ICD-10-CM | POA: Diagnosis not present

## 2012-07-21 DIAGNOSIS — E1129 Type 2 diabetes mellitus with other diabetic kidney complication: Secondary | ICD-10-CM | POA: Diagnosis not present

## 2012-07-21 DIAGNOSIS — E1165 Type 2 diabetes mellitus with hyperglycemia: Secondary | ICD-10-CM | POA: Diagnosis not present

## 2012-07-21 DIAGNOSIS — Z23 Encounter for immunization: Secondary | ICD-10-CM | POA: Diagnosis not present

## 2012-07-21 DIAGNOSIS — Z6830 Body mass index (BMI) 30.0-30.9, adult: Secondary | ICD-10-CM | POA: Diagnosis not present

## 2012-09-03 DIAGNOSIS — I10 Essential (primary) hypertension: Secondary | ICD-10-CM | POA: Diagnosis not present

## 2012-09-03 DIAGNOSIS — R000 Tachycardia, unspecified: Secondary | ICD-10-CM | POA: Diagnosis not present

## 2012-09-03 DIAGNOSIS — E785 Hyperlipidemia, unspecified: Secondary | ICD-10-CM | POA: Diagnosis not present

## 2012-09-03 DIAGNOSIS — I428 Other cardiomyopathies: Secondary | ICD-10-CM | POA: Diagnosis not present

## 2012-09-28 DIAGNOSIS — N184 Chronic kidney disease, stage 4 (severe): Secondary | ICD-10-CM | POA: Diagnosis not present

## 2012-09-28 DIAGNOSIS — F411 Generalized anxiety disorder: Secondary | ICD-10-CM | POA: Diagnosis not present

## 2012-09-28 DIAGNOSIS — R300 Dysuria: Secondary | ICD-10-CM | POA: Diagnosis not present

## 2012-09-28 DIAGNOSIS — E1129 Type 2 diabetes mellitus with other diabetic kidney complication: Secondary | ICD-10-CM | POA: Diagnosis not present

## 2012-09-28 DIAGNOSIS — N390 Urinary tract infection, site not specified: Secondary | ICD-10-CM | POA: Diagnosis not present

## 2012-11-24 DIAGNOSIS — IMO0001 Reserved for inherently not codable concepts without codable children: Secondary | ICD-10-CM | POA: Diagnosis not present

## 2012-11-24 DIAGNOSIS — E785 Hyperlipidemia, unspecified: Secondary | ICD-10-CM | POA: Diagnosis not present

## 2012-11-24 DIAGNOSIS — I10 Essential (primary) hypertension: Secondary | ICD-10-CM | POA: Diagnosis not present

## 2012-11-27 ENCOUNTER — Other Ambulatory Visit: Payer: Self-pay | Admitting: Family Medicine

## 2012-12-08 DIAGNOSIS — IMO0001 Reserved for inherently not codable concepts without codable children: Secondary | ICD-10-CM | POA: Diagnosis not present

## 2012-12-08 DIAGNOSIS — E785 Hyperlipidemia, unspecified: Secondary | ICD-10-CM | POA: Diagnosis not present

## 2012-12-08 DIAGNOSIS — I10 Essential (primary) hypertension: Secondary | ICD-10-CM | POA: Diagnosis not present

## 2012-12-21 DIAGNOSIS — E1165 Type 2 diabetes mellitus with hyperglycemia: Secondary | ICD-10-CM | POA: Diagnosis not present

## 2012-12-21 DIAGNOSIS — E1129 Type 2 diabetes mellitus with other diabetic kidney complication: Secondary | ICD-10-CM | POA: Diagnosis not present

## 2012-12-30 DIAGNOSIS — N390 Urinary tract infection, site not specified: Secondary | ICD-10-CM | POA: Diagnosis not present

## 2012-12-30 DIAGNOSIS — R300 Dysuria: Secondary | ICD-10-CM | POA: Diagnosis not present

## 2013-01-18 DIAGNOSIS — E785 Hyperlipidemia, unspecified: Secondary | ICD-10-CM | POA: Diagnosis not present

## 2013-01-18 DIAGNOSIS — IMO0001 Reserved for inherently not codable concepts without codable children: Secondary | ICD-10-CM | POA: Diagnosis not present

## 2013-01-22 DIAGNOSIS — E1142 Type 2 diabetes mellitus with diabetic polyneuropathy: Secondary | ICD-10-CM | POA: Diagnosis not present

## 2013-01-22 DIAGNOSIS — N184 Chronic kidney disease, stage 4 (severe): Secondary | ICD-10-CM | POA: Diagnosis not present

## 2013-01-22 DIAGNOSIS — E1149 Type 2 diabetes mellitus with other diabetic neurological complication: Secondary | ICD-10-CM | POA: Diagnosis not present

## 2013-01-22 DIAGNOSIS — IMO0001 Reserved for inherently not codable concepts without codable children: Secondary | ICD-10-CM | POA: Diagnosis not present

## 2013-01-22 DIAGNOSIS — E785 Hyperlipidemia, unspecified: Secondary | ICD-10-CM | POA: Diagnosis not present

## 2013-01-22 DIAGNOSIS — R112 Nausea with vomiting, unspecified: Secondary | ICD-10-CM | POA: Diagnosis not present

## 2013-02-05 DIAGNOSIS — R350 Frequency of micturition: Secondary | ICD-10-CM | POA: Diagnosis not present

## 2013-02-05 DIAGNOSIS — N3000 Acute cystitis without hematuria: Secondary | ICD-10-CM | POA: Diagnosis not present

## 2013-02-10 DIAGNOSIS — R112 Nausea with vomiting, unspecified: Secondary | ICD-10-CM | POA: Diagnosis not present

## 2013-02-10 DIAGNOSIS — R109 Unspecified abdominal pain: Secondary | ICD-10-CM | POA: Diagnosis not present

## 2013-02-12 DIAGNOSIS — R112 Nausea with vomiting, unspecified: Secondary | ICD-10-CM | POA: Diagnosis not present

## 2013-03-01 ENCOUNTER — Telehealth: Payer: Self-pay | Admitting: Neurology

## 2013-03-01 ENCOUNTER — Ambulatory Visit (INDEPENDENT_AMBULATORY_CARE_PROVIDER_SITE_OTHER): Payer: Medicare Other | Admitting: Family Medicine

## 2013-03-01 VITALS — BP 104/60 | HR 107 | Temp 98.2°F | Resp 18 | Ht 64.5 in | Wt 175.2 lb

## 2013-03-01 DIAGNOSIS — R251 Tremor, unspecified: Secondary | ICD-10-CM

## 2013-03-01 DIAGNOSIS — D509 Iron deficiency anemia, unspecified: Secondary | ICD-10-CM

## 2013-03-01 DIAGNOSIS — E119 Type 2 diabetes mellitus without complications: Secondary | ICD-10-CM

## 2013-03-01 DIAGNOSIS — W19XXXA Unspecified fall, initial encounter: Secondary | ICD-10-CM | POA: Diagnosis not present

## 2013-03-01 DIAGNOSIS — R55 Syncope and collapse: Secondary | ICD-10-CM

## 2013-03-01 DIAGNOSIS — R259 Unspecified abnormal involuntary movements: Secondary | ICD-10-CM | POA: Diagnosis not present

## 2013-03-01 DIAGNOSIS — R42 Dizziness and giddiness: Secondary | ICD-10-CM | POA: Diagnosis not present

## 2013-03-01 LAB — POCT CBC
MCH, POC: 20.8 pg — AB (ref 27–31.2)
MCHC: 28.8 g/dL — AB (ref 31.8–35.4)
MCV: 72.3 fL — AB (ref 80–97)
MID (cbc): 0.6 (ref 0–0.9)
MPV: 6 fL (ref 0–99.8)
POC LYMPH PERCENT: 35.8 %L (ref 10–50)
POC MID %: 8.5 %M (ref 0–12)
Platelet Count, POC: 406 10*3/uL (ref 142–424)
RBC: 4.47 M/uL (ref 4.04–5.48)
RDW, POC: 18.1 %
WBC: 7.4 10*3/uL (ref 4.6–10.2)

## 2013-03-01 NOTE — Patient Instructions (Addendum)
We will make referral to your gastroenterologist and neurologist  Home stools testing for blood.  After finishing the stool specimens to begin taking over-the-counter iron one twice daily with food  Be very cautious to avoid falls. Stand up slowly. Drink plenty of fluids.  Return if worse or see her primary care doctor.

## 2013-03-01 NOTE — Progress Notes (Addendum)
Subjective:  79 year old lady who's primary care doctor is Dr. Cheron Schaumann at [facility]. The patient has had a half a dozen near syncopal episodes in the last 3 days. Apparently about 3 years ago she had a closed head injury, and had some of these kind of episodes at that time. She also developed some years at that time. She been doing fairly well, and in this weekend has had 6 episodes. She feels like something starts the top of the head and works went down quickly, giving no warning. She has to grab on to something, though she doesn't find herself unable to hold on. She feels like it throws her down. She is not having full-blown  convulsion. She has not been fully syncopal. She remembers the episodes. People have called her several times, but she has fallen down several times also. She hurt her left ankle bit. She had 2 episodes this morning. These have been witnessed by other people. She has occasional headaches, but none associated with this. She feels dizzy. No chest pains or palpitations or nausea or vomiting.  Objective: Elderly lady who is alert and oriented. She has a slight tremor of her hands, non-parkinsonian. She is a little shaky actually all over. TMs are normal. Eyes right pupil is about 3 mm in its, left tube. She has had surgery. Fundi appear benign. EOMs intact. Throat clear. Neck supple without nodes or thyromegaly. There was a soft carotid bruit on the left but I couldn't be certain that she had one, and her pulse of hearing. Her lungs are clear to auscultation. Heart regular without murmurs. No atrophy was noted. Extremities have trace bit of swelling in the left leg, probably from her fall. Motor is symmetrical. Romberg negative. Gait normal.  Assessment: Near syncopal episodes Type 2 diabetes mellitus Mild tremor Remote history of closed head injury  Plan: Near syncopal episodes of undetermined etiology. Will check some labs, and EKG, and order an MRI I on her. She is to  return or go to the emergency room all worse at anytime. Reviewed back to previous CT scan and MRI. She has a tiny amount of small vessel disease.  Plan to have her go back and see her neurologist. Also will check some baseline labs and an EKG on her, and proceed from there.  Results for orders placed in visit on 03/01/13  POCT CBC      Result Value Range   WBC 7.4  4.6 - 10.2 K/uL   Lymph, poc 2.6  0.6 - 3.4   POC LYMPH PERCENT 35.8  10 - 50 %L   MID (cbc) 0.6  0 - 0.9   POC MID % 8.5  0 - 12 %M   POC Granulocyte 4.1  2 - 6.9   Granulocyte percent 55.7  37 - 80 %G   RBC 4.47  4.04 - 5.48 M/uL   Hemoglobin 9.3 (*) 12.2 - 16.2 g/dL   HCT, POC 32.3 (*) 37.7 - 47.9 %   MCV 72.3 (*) 80 - 97 fL   MCH, POC 20.8 (*) 27 - 31.2 pg   MCHC 28.8 (*) 31.8 - 35.4 g/dL   RDW, POC 18.1     Platelet Count, POC 406  142 - 424 K/uL   MPV 6.0  0 - 99.8 fL  GLUCOSE, POCT (MANUAL RESULT ENTRY)      Result Value Range   POC Glucose 110 (*) 70 - 99 mg/dl   Patient is anemic. She recently saw  her gastroenterologist and had an upper endoscopy. She was told her stomach and into well. I told her she needs to see him back to see if she is losing blood elsewhere.  After getting the stool for blood specimens she is to begin on iron one daily or 2 daily

## 2013-03-02 LAB — COMPREHENSIVE METABOLIC PANEL
ALT: 9 U/L (ref 0–35)
AST: 11 U/L (ref 0–37)
Alkaline Phosphatase: 82 U/L (ref 39–117)
BUN: 22 mg/dL (ref 6–23)
Calcium: 9.5 mg/dL (ref 8.4–10.5)
Creat: 1.54 mg/dL — ABNORMAL HIGH (ref 0.50–1.10)
Total Bilirubin: 0.2 mg/dL — ABNORMAL LOW (ref 0.3–1.2)

## 2013-03-02 LAB — FERRITIN: Ferritin: 2 ng/mL — ABNORMAL LOW (ref 10–291)

## 2013-03-02 NOTE — Addendum Note (Signed)
Addended byCandice Camp on: 03/02/2013 03:03 PM   Modules accepted: Orders

## 2013-03-03 DIAGNOSIS — D649 Anemia, unspecified: Secondary | ICD-10-CM | POA: Diagnosis not present

## 2013-03-03 DIAGNOSIS — N3000 Acute cystitis without hematuria: Secondary | ICD-10-CM | POA: Diagnosis not present

## 2013-03-04 DIAGNOSIS — R112 Nausea with vomiting, unspecified: Secondary | ICD-10-CM | POA: Diagnosis not present

## 2013-03-04 DIAGNOSIS — D500 Iron deficiency anemia secondary to blood loss (chronic): Secondary | ICD-10-CM | POA: Diagnosis not present

## 2013-03-04 DIAGNOSIS — K5900 Constipation, unspecified: Secondary | ICD-10-CM | POA: Diagnosis not present

## 2013-03-05 DIAGNOSIS — D126 Benign neoplasm of colon, unspecified: Secondary | ICD-10-CM | POA: Diagnosis not present

## 2013-03-05 DIAGNOSIS — K5900 Constipation, unspecified: Secondary | ICD-10-CM | POA: Diagnosis not present

## 2013-03-05 DIAGNOSIS — K5730 Diverticulosis of large intestine without perforation or abscess without bleeding: Secondary | ICD-10-CM | POA: Diagnosis not present

## 2013-03-05 DIAGNOSIS — D649 Anemia, unspecified: Secondary | ICD-10-CM | POA: Diagnosis not present

## 2013-03-08 ENCOUNTER — Other Ambulatory Visit: Payer: Self-pay | Admitting: Radiology

## 2013-03-08 MED ORDER — LEVOTHYROXINE SODIUM 50 MCG PO TABS: 50.0000 ug | ORAL_TABLET | Freq: Every day | ORAL | Status: DC

## 2013-03-08 NOTE — Telephone Encounter (Signed)
Sent in Synthroid/ see lab results.

## 2013-03-11 ENCOUNTER — Encounter: Payer: Self-pay | Admitting: Neurology

## 2013-03-11 ENCOUNTER — Ambulatory Visit (INDEPENDENT_AMBULATORY_CARE_PROVIDER_SITE_OTHER): Payer: Medicare Other | Admitting: Neurology

## 2013-03-11 ENCOUNTER — Other Ambulatory Visit: Payer: Self-pay | Admitting: *Deleted

## 2013-03-11 VITALS — BP 94/58 | HR 95 | Ht 65.5 in | Wt 175.0 lb

## 2013-03-11 DIAGNOSIS — R6889 Other general symptoms and signs: Secondary | ICD-10-CM | POA: Diagnosis not present

## 2013-03-11 DIAGNOSIS — R55 Syncope and collapse: Secondary | ICD-10-CM | POA: Diagnosis not present

## 2013-03-11 DIAGNOSIS — Z87820 Personal history of traumatic brain injury: Secondary | ICD-10-CM | POA: Diagnosis not present

## 2013-03-11 DIAGNOSIS — I951 Orthostatic hypotension: Secondary | ICD-10-CM | POA: Diagnosis not present

## 2013-03-11 NOTE — Patient Instructions (Addendum)
I think overall you are doing fairly well but I do want to suggest a few things today:  Remember to drink plenty of fluid, eat healthy meals and do not skip any meals. Try to eat protein with a every meal and eat a healthy snack such as fruit or nuts in between meals. Try to keep a regular sleep-wake schedule and try to exercise daily, particularly in the form of walking, 20-30 minutes a day, if you can.   Your blood pressure dropped when you stood up today.  As far as your medications are concerned, I would like to suggest no new medication, try to reduce sedating medications.    Please do not drive, please start using a rolling walker at all times.     As far as diagnostic testing: MRI brain, US carotid arteries, EEG.  I would like to see you back in 3 months, sooner if we need to. Please call us with any interim questions, concerns, problems, updates or refill requests.  Please also call us for any test results so we can go over those with you on the phone. Richardson Landry is my clinical assistant and will answer any of your questions and relay your messages to me and also relay most of my messages to you.  Our phone number is [phone]. We also have an after hours call service for urgent matters and there is a physician on-call for urgent questions. For any emergencies you know to call 911 or go to the nearest emergency room.

## 2013-03-11 NOTE — Progress Notes (Signed)
Subjective:    Patient ID: Melissa [name] is a 79 y.o. female.  HPI   Melissa Age, MD, PhD [facility] [address] [id number], [place] [address]  Dear Dr. Linna Darner,  I saw your patient, Melissa [name], upon your kind request in my neurologic clinic today for initial consultation of her dizziness and near syncope. The patient is accompanied by her GD, Melissa [name], today. As you know, Melissa [name] is a very pleasant 79 year old right-handed woman with an underlying medical history of diabetes, hypertension prior TIA and prior concussion who has been experiencing dizziness for the past several years, worse in the past 6 months. In the past he was seen by my colleague Dr. Jannifer [name] in December 2000 and after she had fallen in a grocery store. She had hit her head and sustained a concussion which required admission to the hospital. Workup included an a head CT, MRI brain, EEG and these tests were unremarkable. She had developed acute confusion and developed a tremor after the fall. This affected both upper extremities. She has been experiencing dizziness off and on since then, but in the past 2 weeks she has had more than one event. She has near-LOC and has fallen or has had near falls. She fell several times in a few days. She saw a cardiologist in 2013 and had a exercise stress test, which was normal. She did not have a Holter monitor, but had an echocardiogram.  Her Sx can happen when sitting or lying and she describes a heat sensation that starts on the top of her head and goes down to her feet. Associated symptoms are soreness in her head, no N/V. No photophobia or sonophobia. No convulsions, no loss of bowel or bladder function, no tongue bite, but she has decreased level of consciousness and this lasts for 15-45 seconds and afterwards she is drained and can fall asleep.   While being triaged, the patient had a spell of decreased attentiveness and dizziness while on the  weighing scale. The nurse describes that she suddenly gripped the handle bar of the weighing scale very tightly and was jittery, this lasted for a few seconds. She was then asked to sit in a wheelchair and has been sitting in the wheelchair since then. She did not bring a cane or walking aid today. She has a cane but not a walker at home. She does not use her cane.  Her Past Medical History Is Significant For: Past Medical History  Diagnosis Date  . Asthma   . Sleep apnea   . Angina   . Hypertension   . Blood transfusion   . GERD (gastroesophageal reflux disease)   . Headache(784.0)   . Arthritis   . Anxiety   . Pneumonia   . Diabetes mellitus   . Anemia   . Hiatal hernia   . Acute kidney injury 09/26/2011  . High cholesterol     Her Past Surgical History Is Significant For: Past Surgical History  Procedure Laterality Date  . Abdominal hysterectomy    . Appendectomy    . Eye surgery    . Tonsillectomy    . Breast surgery      Her Family History Is Significant For: Family History  Problem Relation [name] of Onset  . Stroke Mother     Her Social History Is Significant For: History   Social History  . Marital Status: Widowed    Spouse Name: N/A    Number of Children:  3  . Years of Education: 11   Occupational History  . Retired    Social History Main Topics  . Smoking status: Former Smoker -- 1.00 packs/day for 20 years    Quit date: 09/21/1961  . Smokeless tobacco: Never Used  . Alcohol Use: No  . Drug Use: No  . Sexually Active: Yes    Birth Control/ Protection: Post-menopausal   Other Topics Concern  . None   Social History Narrative   Pt lives at home with her daughter and son in [place].   Caffeine Use: very little    Her Allergies Are:  No Known Allergies:   Her Current Medications Are:  Outpatient Encounter Prescriptions as of 03/11/2013  Medication Sig Dispense Refill  . amitriptyline (ELAVIL) 25 MG tablet Take 25 mg by mouth at bedtime.      Marland Kitchen  aspirin EC 81 MG tablet Take 81 mg by mouth daily.        Marland Kitchen atenolol (TENORMIN) 50 MG tablet Take 50 mg by mouth daily.      Marland Kitchen atorvastatin (LIPITOR) 20 MG tablet Take 20 mg by mouth daily.        . cetirizine (ZYRTEC) 10 MG tablet Take 10 mg by mouth daily.      . Cholecalciferol (VITAMIN D3) 1000 UNITS CAPS Take 1 capsule by mouth daily.      Marland Kitchen esomeprazole (NEXIUM) 40 MG capsule Take 40 mg by mouth 2 (two) times daily.        . ferrous sulfate 325 (65 FE) MG EC tablet Take 325 mg by mouth 3 (three) times daily with meals.      . Fiber TABS Take 1 tablet by mouth daily.      Marland Kitchen FLUoxetine (PROZAC) 20 MG capsule Take 20 mg by mouth daily.       Marland Kitchen gabapentin (NEURONTIN) 100 MG capsule as needed.       . Glucosamine-Chondroitin (OSTEO BI-FLEX REGULAR STRENGTH PO) Take 1 tablet by mouth daily.      Marland Kitchen HYDROcodone-acetaminophen (NORCO/VICODIN) 5-325 MG per tablet 1 tablet every 6 (six) hours as needed.       . Insulin Aspart (NOVOLOG [place]) Inject 34 Units into the skin at bedtime and may repeat dose one time if needed.      . Insulin Detemir (LEVEMIR [place]) Inject 8 Units into the skin 3 (three) times daily with meals.      Marland Kitchen levothyroxine (SYNTHROID, LEVOTHROID) 50 MCG tablet Take 1 tablet (50 mcg total) by mouth daily.  30 tablet  3  . Liraglutide (VICTOZA [place]) Inject 12 Units into the skin at bedtime and may repeat dose one time if needed.      Marland Kitchen LORazepam (ATIVAN) 0.5 MG tablet Take 0.5 mg by mouth every 8 (eight) hours.      . ONE TOUCH ULTRA TEST test strip       . OVER THE COUNTER MEDICATION Place 1 drop into both eyes every morning. Patient uses over the counter eye drop that begins with a "s".       . polyethylene glycol powder (GLYCOLAX/MIRALAX) powder Take 17 g by mouth daily.      Marland Kitchen sulfamethoxazole-trimethoprim (BACTRIM DS) 800-160 MG per tablet Take 1 tablet by mouth every 12 (twelve) hours.       . triamterene-hydrochlorothiazide (MAXZIDE-25) 37.5-25 MG per tablet Take 1 tablet by mouth  daily.        . [DISCONTINUED] albuterol (PROVENTIL) (5 MG/ML) 0.5% nebulizer solution Take 0.5  mLs (2.5 mg total) by nebulization every 4 (four) hours as needed for wheezing or shortness of breath.  20 mL  1  . [DISCONTINUED] amitriptyline (ELAVIL) 10 MG tablet Take 20 mg by mouth at bedtime as needed. sleep      . [DISCONTINUED] amoxicillin-clavulanate (AUGMENTIN) 875-125 MG per tablet       . [DISCONTINUED] atenolol (TENORMIN) 100 MG tablet Take 100 mg by mouth daily.      . [DISCONTINUED] BD PEN NEEDLE NANO U/F 32G X 4 MM MISC       . [DISCONTINUED] ciprofloxacin (CIPRO) 250 MG tablet       . [DISCONTINUED] fluticasone (FLOVENT HFA) 44 MCG/ACT inhaler Inhale 3 puffs into the lungs 2 (two) times daily.  1 Inhaler  1  . [DISCONTINUED] polyethylene glycol-electrolytes (NULYTELY/GOLYTELY) 420 G solution       . [DISCONTINUED] promethazine (PHENERGAN) 25 MG tablet       . [DISCONTINUED] sitaGLIPtin (JANUVIA) 50 MG tablet Take 1 tablet (50 mg total) by mouth daily.  14 tablet  1  . [DISCONTINUED] Spacer/Aero-Holding Chambers (AEROCHAMBER PLUS WITH MASK- LARGE) MISC 1 each by Other route once.  1 each  0   No facility-administered encounter medications on file as of 03/11/2013.   Review of Systems  Constitutional: Positive for fatigue.       Restless legs  HENT: Positive for rhinorrhea.        Ringing in ears, spinning sensation  Respiratory: Positive for shortness of breath.   Gastrointestinal: Positive for constipation.  Endocrine: Positive for heat intolerance and polydipsia.  Genitourinary:       Urination problems  Musculoskeletal: Positive for arthralgias.       Aching muscles  Neurological:       Memory loss, confusion, headache, numbness, weakness, slurred speech, dizziness, tremor restless legs  Hematological: Bruises/bleeds easily.       Anemia  Psychiatric/Behavioral:       Anxiety, decreased energy    Objective:  Neurologic Exam  Physical Exam Physical Examination:    Filed Vitals:   03/11/13 0846  BP: 94/58  Pulse: 95    General Examination: The patient is a very pleasant 79 y.o. female in no acute distress. She appears well-developed and well-nourished and well groomed. She appears mildly anxious.  HEENT: Normocephalic, atraumatic, pupils are equal, round and reactive to light and accommodation. Funduscopic exam is normal with sharp disc margins noted. Extraocular tracking is good without limitation to gaze excursion or nystagmus noted. Normal smooth pursuit is noted. Hearing is grossly intact. Tympanic membranes are clear bilaterally. Face is symmetric with normal facial animation and normal facial sensation. Speech is clear with no dysarthria noted. There is no hypophonia. There is no lip, neck/head, jaw or voice tremor. Neck is supple with full range of passive and active motion. There are no carotid bruits on auscultation. Oropharynx exam reveals: moderate mouth dryness, adequate dental hygiene and mild airway crowding, due to redundant soft palate. Mallampati is class II. Tongue protrudes centrally and palate elevates symmetrically.   Chest: Clear to auscultation without wheezing, rhonchi or crackles noted.  Heart: S1+S2+0, regular and normal without murmurs, rubs or gallops noted.   Abdomen: Soft, non-tender and non-distended with normal bowel sounds appreciated on auscultation.  Extremities: There is no pitting edema in the distal lower extremities bilaterally.   Skin: Warm and dry without trophic changes noted. There are no varicose veins, but has spider veins.  Musculoskeletal: exam reveals no obvious joint deformities, tenderness or joint  swelling or erythema.   Neurologically:  Mental status: The patient is awake, alert and oriented in all 4 spheres. Her memory, attention, language and knowledge are appropriate. There is no aphasia, agnosia, apraxia or anomia. Speech is clear with normal prosody and enunciation. Thought process is linear.  Mood is congruent and affect is normal.  Cranial nerves are as described above under HEENT exam. In addition, shoulder shrug is normal with equal shoulder height noted. Motor exam: Normal bulk, strength and tone is noted. There is no drift, tremor or rebound. Romberg is negative. Reflexes are 1+ in the UEs and absent in the LEs. Toes are downgoing bilaterally. Fine motor skills are intact with normal finger taps, normal hand movements, normal rapid alternating patting, normal foot taps and normal foot agility.  Cerebellar testing shows no dysmetria or intention tremor on finger to nose testing. Heel to shin is unremarkable bilaterally. There is no truncal or gait ataxia.  Sensory exam is intact to light touch, pinprick, vibration, temperature sense in the upper extremities, with mild decrease in temperature and vibration sense in the distal LEs.  Gait, station and balance: she stands up with mild difficulty and walks insecurely. No veering to one side is noted. No leaning to one side is noted. Posture is [name]-appropriate and stance is narrow based. No problems turning are noted. Tandem walk is difficult. Intact toe and heel stance is noted.               Assessment and Plan:   In summary, SULAF FINLEY is a very pleasant 79 y.o.-year old female with a approximately 4 year history of dizziness, spells of tremulousness and syncope as well as presyncopal symptoms. Her exam is relatively nonfocal today. She does have evidence of mild neuropathy and she also had an orthostatic blood pressure drop today. I had a long chat with the patient and her granddaughter Melissa [name] about my findings and her symptoms. I am not completely sure as to the etiology of her symptoms. Differential diagnoses include vasovagal syncope, orthostatic hypotension, seizure, decrease blood flow to the brain due to carotid artery disease. I asked her to keep well hydrated. I also asked her to start using a rolling walker with a seat so if she  feels a sensation of lightheadedness or dizziness, and she can sit down even if she is upright. She has had prior investigation with an MRI brain and EEG before but I will repeat it. I will also add a carotid Doppler ultrasound study. She is advised to followup in 3 months from now, sooner if the need arises and we will be calling her with her test results as they come in. I did not suggest any new medications today. I asked her not to drive. I did ask her to reduce sedating medications as much is possible such as her Vicodin use.  I answered all their questions today and the patient and her GD were in agreement with the above outlined plan. I would like to see the patient back in 3 months, sooner if the need arises and encouraged them to call with any interim questions, concerns, problems or updates and test results.   Thank you very much for allowing me to participate in the care of this nice patient. If I can be of any further assistance to you please do not hesitate to call me at [phone].  Sincerely,   Melissa Age, MD, PhD

## 2013-03-19 ENCOUNTER — Telehealth: Payer: Self-pay | Admitting: *Deleted

## 2013-03-19 DIAGNOSIS — R6889 Other general symptoms and signs: Secondary | ICD-10-CM

## 2013-03-19 DIAGNOSIS — I951 Orthostatic hypotension: Secondary | ICD-10-CM

## 2013-03-19 DIAGNOSIS — R55 Syncope and collapse: Secondary | ICD-10-CM

## 2013-03-19 NOTE — Telephone Encounter (Signed)
Betsy from [facility] called and needs order for Rollator. Order placed.

## 2013-03-24 ENCOUNTER — Ambulatory Visit
Admission: RE | Admit: 2013-03-24 | Discharge: 2013-03-24 | Disposition: A | Discharge status: Home / Self Care | Payer: Medicare Other | Source: Ambulatory Visit | Attending: Neurology | Admitting: Neurology

## 2013-03-24 ENCOUNTER — Other Ambulatory Visit: Payer: Self-pay | Admitting: Neurology

## 2013-03-24 DIAGNOSIS — R55 Syncope and collapse: Secondary | ICD-10-CM | POA: Diagnosis not present

## 2013-03-24 DIAGNOSIS — Z87820 Personal history of traumatic brain injury: Secondary | ICD-10-CM

## 2013-03-24 DIAGNOSIS — R6889 Other general symptoms and signs: Secondary | ICD-10-CM

## 2013-03-24 DIAGNOSIS — I951 Orthostatic hypotension: Secondary | ICD-10-CM

## 2013-03-24 DIAGNOSIS — R42 Dizziness and giddiness: Secondary | ICD-10-CM

## 2013-03-24 LAB — IFOBT (OCCULT BLOOD): IFOBT: NEGATIVE

## 2013-03-25 ENCOUNTER — Ambulatory Visit (INDEPENDENT_AMBULATORY_CARE_PROVIDER_SITE_OTHER): Payer: Medicare Other

## 2013-03-25 DIAGNOSIS — I951 Orthostatic hypotension: Secondary | ICD-10-CM

## 2013-03-25 DIAGNOSIS — Z87820 Personal history of traumatic brain injury: Secondary | ICD-10-CM

## 2013-03-25 DIAGNOSIS — R55 Syncope and collapse: Secondary | ICD-10-CM

## 2013-03-25 DIAGNOSIS — R6889 Other general symptoms and signs: Secondary | ICD-10-CM

## 2013-03-27 NOTE — Progress Notes (Signed)
Quick Note:  Please call : Patient's MRI shows small vessel changes, these come with age , but can be prematurely seen i patient with HTN, DM and smokers, and migraine patients.  To treat these underlying risk factors is necessary, and baby-aspirin daily is standard treatment. ______

## 2013-03-29 ENCOUNTER — Ambulatory Visit (INDEPENDENT_AMBULATORY_CARE_PROVIDER_SITE_OTHER): Payer: Medicare Other | Admitting: Radiology

## 2013-03-29 ENCOUNTER — Telehealth: Payer: Self-pay | Admitting: Neurology

## 2013-03-29 ENCOUNTER — Other Ambulatory Visit: Payer: Medicare Other | Admitting: Radiology

## 2013-03-29 DIAGNOSIS — R55 Syncope and collapse: Secondary | ICD-10-CM

## 2013-03-29 DIAGNOSIS — R6889 Other general symptoms and signs: Secondary | ICD-10-CM

## 2013-03-29 DIAGNOSIS — Z87820 Personal history of traumatic brain injury: Secondary | ICD-10-CM

## 2013-03-29 DIAGNOSIS — IMO0001 Reserved for inherently not codable concepts without codable children: Secondary | ICD-10-CM | POA: Diagnosis not present

## 2013-03-29 DIAGNOSIS — N3000 Acute cystitis without hematuria: Secondary | ICD-10-CM | POA: Diagnosis not present

## 2013-03-29 DIAGNOSIS — D649 Anemia, unspecified: Secondary | ICD-10-CM | POA: Diagnosis not present

## 2013-03-29 DIAGNOSIS — I951 Orthostatic hypotension: Secondary | ICD-10-CM

## 2013-03-29 NOTE — Telephone Encounter (Signed)
I called the patient and I talked with the granddaughter, who is her caretaker. The EEG study is unremarkable.

## 2013-03-29 NOTE — Procedures (Signed)
  History:  Melissa [name] is an 79 year old patient with a history of episodes of near-syncope associated with a hot flushed sensation, dizziness. The patient does not actually lose consciousness. The patient is being evaluated for these events.  This is a routine EEG. No skull defects are noted. Medications include amitriptyline, aspirin, atenolol, Lipitor, Nexium, Prozac, gabapentin, hydrocodone, insulin, Synthroid, Victoza, Ativan, MiraLax, and Maxzide.  EEG classification: Essentially normal awake  Description of the recording: The background rhythms of this recording consists of a fairly well modulated medium amplitude alpha rhythm of 10 Hz that is reactive to eye opening and closure. As the record progresses, the patient appears to remain in the waking state throughout the recording. Photic stimulation was performed, resulting in a bilateral and symmetric photic driving response. Hyperventilation was not performed. At no time during the recording does there appear to be evidence of spike or spike wave discharges or evidence of focal slowing. EKG monitor shows no evidence of cardiac rhythm abnormalities with a heart rate of 102.  Impression: This is an essentially normal EEG recording in the waking state. No evidence of ictal or interictal discharges are seen.

## 2013-03-29 NOTE — Progress Notes (Signed)
Quick Note:  Spoke with patient's daughter and relayed the results of the pt's MRI. The patient was also reminded of any future appointments. The patient understood and had no questions.  ______

## 2013-03-31 DIAGNOSIS — N390 Urinary tract infection, site not specified: Secondary | ICD-10-CM | POA: Diagnosis not present

## 2013-03-31 DIAGNOSIS — I10 Essential (primary) hypertension: Secondary | ICD-10-CM | POA: Diagnosis not present

## 2013-03-31 DIAGNOSIS — E1129 Type 2 diabetes mellitus with other diabetic kidney complication: Secondary | ICD-10-CM | POA: Diagnosis not present

## 2013-03-31 DIAGNOSIS — E1165 Type 2 diabetes mellitus with hyperglycemia: Secondary | ICD-10-CM | POA: Diagnosis not present

## 2013-03-31 DIAGNOSIS — E1142 Type 2 diabetes mellitus with diabetic polyneuropathy: Secondary | ICD-10-CM | POA: Diagnosis not present

## 2013-04-05 NOTE — Progress Notes (Signed)
Quick Note:  Please call and advise the patient that the EEG or brain wave test we performed was reported as normal in the awake state. We checked for abnormal electrical activity but there was no abnormality found during wakefulness. No further action is required on this test at this time. Please remind patient to keep any upcoming appointments or tests and to call us with any interim questions, concerns, problems or updates. Thanks,  Star Age, MD, PhD   ______

## 2013-05-14 DIAGNOSIS — E039 Hypothyroidism, unspecified: Secondary | ICD-10-CM | POA: Diagnosis not present

## 2013-05-14 DIAGNOSIS — D649 Anemia, unspecified: Secondary | ICD-10-CM | POA: Diagnosis not present

## 2013-05-14 DIAGNOSIS — N390 Urinary tract infection, site not specified: Secondary | ICD-10-CM | POA: Diagnosis not present

## 2013-05-14 DIAGNOSIS — R000 Tachycardia, unspecified: Secondary | ICD-10-CM | POA: Diagnosis not present

## 2013-05-20 DIAGNOSIS — R112 Nausea with vomiting, unspecified: Secondary | ICD-10-CM | POA: Diagnosis not present

## 2013-05-20 DIAGNOSIS — D500 Iron deficiency anemia secondary to blood loss (chronic): Secondary | ICD-10-CM | POA: Diagnosis not present

## 2013-06-01 ENCOUNTER — Encounter: Payer: Self-pay | Admitting: Neurology

## 2013-06-01 ENCOUNTER — Ambulatory Visit (INDEPENDENT_AMBULATORY_CARE_PROVIDER_SITE_OTHER): Payer: Medicare Other | Admitting: Neurology

## 2013-06-01 VITALS — BP 116/68 | HR 91 | Temp 98.0°F | Ht 65.0 in | Wt 170.0 lb

## 2013-06-01 DIAGNOSIS — R6889 Other general symptoms and signs: Secondary | ICD-10-CM | POA: Diagnosis not present

## 2013-06-01 DIAGNOSIS — R55 Syncope and collapse: Secondary | ICD-10-CM

## 2013-06-01 DIAGNOSIS — D649 Anemia, unspecified: Secondary | ICD-10-CM | POA: Diagnosis not present

## 2013-06-01 NOTE — Patient Instructions (Signed)
I think overall you are doing fairly well and are stable at this point.   I do have some generic suggestions for you today:   Please make sure that you drink plenty of fluids. I would like for you to exercise daily for example in the form of walking 20-30 minutes every day, if you can. Please keep a regular sleep-wake schedule, keep regular meal times, do not skip any meals, eat  healthy snacks in between meals, such as fruit or nuts. Try to eat protein with every meal.   As far as your medications are concerned, I would like to suggest:  No  New medication  As far as diagnostic testing, I recommend: no new test.   Please use a cane for safety and change positions slowly.  I think you're stable enough that I can see you back on an as needed basis.

## 2013-06-01 NOTE — Progress Notes (Signed)
Subjective:    Patient ID: Melissa [name] is a 79 y.o. female.  HPI  Interim history:   Melissa [name] is a very pleasant 79 year old right-handed woman with an underlying medical history of diabetes, hypertension, prior TIA and prior concussion who presents for FU consultation of her dizziness and near-syncope. She is accompanied by her granddaughter, Melissa [name] again today. The first met her on 03/11/2013, at which time she reported experiencing dizziness for the past several years, worse in the past 6 months. She had seen Dr. Jannifer [name] in December 2000 after she had sustained a fall in a grocery store. She was diagnosed with concussion and was admitted to the hospital. Workup there included a head CT, MRI brain, EEG, all of which were unremarkable. She developed confusion and tremor after the fall. She saw a cardiologist in 2013 and had a stress test which was normal. At the time of her first visit with me I suggested a brain MRI, EEG and C. Doppler studies. She had a brain MRI without contrast on 03/24/2013 which showed mild scattered periventricular and subcortical chronic small vessel ischemic disease and no significant changes from an MRI from February 2012. She had an EEG on 03/29/2013 which was reported as normal in the awake state. She had a carotid Doppler test on 03/25/2013 which showed 50-69% stenosis of the left ICA.  Her Sx can happen when sitting or lying and she describes a heat sensation that starts on the top of her head and goes down to her feet. Associated symptoms are soreness in her head, no N/V. No photophobia or sonophobia. No convulsions, no loss of bowel or bladder function, no tongue bite, but she has decreased level of consciousness and this lasts for 15-45 seconds and afterwards she is drained and can fall asleep.  She was diagnosed with anemia recently, had upper and lower endoscopies in 5/14 and is now on integra as the Fe sulfate was not enough to bring up her Hb. She feels improved and  has not had a spell in at least 2 months.    Her Past Medical History Is Significant For: Past Medical History  Diagnosis Date  . Asthma   . Sleep apnea   . Angina   . Hypertension   . Blood transfusion   . GERD (gastroesophageal reflux disease)   . Headache(784.0)   . Arthritis   . Anxiety   . Pneumonia   . Diabetes mellitus   . Anemia   . Hiatal hernia   . Acute kidney injury 09/26/2011  . High cholesterol     Her Past Surgical History Is Significant For: Past Surgical History  Procedure Laterality Date  . Abdominal hysterectomy    . Appendectomy    . Eye surgery    . Tonsillectomy    . Breast surgery      Her Family History Is Significant For: Family History  Problem Relation Age of Onset  . Stroke Mother     Her Social History Is Significant For: History   Social History  . Marital Status: Widowed    Spouse Name: N/A    Number of Children: 3  . Years of Education: 11   Occupational History  . Retired    Social History Main Topics  . Smoking status: Former Smoker -- 1.00 packs/day for 20 years    Quit date: 09/21/1961  . Smokeless tobacco: Never Used  . Alcohol Use: No  . Drug Use: No  . Sexual Activity: Yes  Birth Control/ Protection: Post-menopausal   Other Topics Concern  . Not on file   Social History Narrative   Pt lives at home with her daughter and son in law.   Caffeine Use: very little    Her Allergies Are:  No Known Allergies:   Her Current Medications Are:  Outpatient Encounter Prescriptions as of 06/01/2013  Medication Sig Dispense Refill  . amitriptyline (ELAVIL) 25 MG tablet Take 25 mg by mouth at bedtime.      Marland Kitchen aspirin EC 81 MG tablet Take 81 mg by mouth daily.        Marland Kitchen atenolol (TENORMIN) 50 MG tablet Take 50 mg by mouth daily.      Marland Kitchen atorvastatin (LIPITOR) 20 MG tablet Take 20 mg by mouth daily.        . BD PEN NEEDLE NANO U/F 32G X 4 MM MISC       . cetirizine (ZYRTEC) 10 MG tablet Take 10 mg by mouth daily.      .  Cholecalciferol (VITAMIN D3) 1000 UNITS CAPS Take 1 capsule by mouth daily.      Marland Kitchen esomeprazole (NEXIUM) 40 MG capsule Take 40 mg by mouth 2 (two) times daily.        . Fe Fum-FePoly-FA-Vit C-Vit B3 (INTEGRA F PO) Take 1 capsule by mouth daily.      Marland Kitchen FLUoxetine (PROZAC) 20 MG capsule Take 20 mg by mouth daily.       . Glucosamine-Chondroitin (OSTEO BI-FLEX REGULAR STRENGTH PO) Take 1 tablet by mouth daily.      Marland Kitchen HYDROcodone-acetaminophen (NORCO/VICODIN) 5-325 MG per tablet 1 tablet every 6 (six) hours as needed.       . Insulin Aspart (NOVOLOG [place]) Inject 34 Units into the skin at bedtime and may repeat dose one time if needed.      . Insulin Detemir (LEVEMIR [place]) Inject 8 Units into the skin 3 (three) times daily with meals.      Marland Kitchen levothyroxine (SYNTHROID, LEVOTHROID) 50 MCG tablet Take 1 tablet (50 mcg total) by mouth daily.  30 tablet  3  . Liraglutide (VICTOZA ) Inject 12 Units into the skin at bedtime and may repeat dose one time if needed.      . nitrofurantoin, macrocrystal-monohydrate, (MACROBID) 100 MG capsule Take 1 capsule by mouth daily.      . ONE TOUCH ULTRA TEST test strip       . OVER THE COUNTER MEDICATION Place 1 drop into both eyes every morning. Patient uses over the counter eye drop that begins with a "s".       . polyethylene glycol powder (GLYCOLAX/MIRALAX) powder Take 17 g by mouth daily.      Marland Kitchen triamterene-hydrochlorothiazide (MAXZIDE-25) 37.5-25 MG per tablet Take 1 tablet by mouth daily.        . [DISCONTINUED] ferrous sulfate 325 (65 FE) MG EC tablet Take 325 mg by mouth 3 (three) times daily with meals.      . [DISCONTINUED] Fiber TABS Take 1 tablet by mouth daily.      . [DISCONTINUED] gabapentin (NEURONTIN) 100 MG capsule as needed.       . [DISCONTINUED] LORazepam (ATIVAN) 0.5 MG tablet Take 0.5 mg by mouth every 8 (eight) hours.      . [DISCONTINUED] sulfamethoxazole-trimethoprim (BACTRIM DS) 800-160 MG per tablet Take 1 tablet by mouth every 12 (twelve) hours.         No facility-administered encounter medications on file as of 06/01/2013.  Review of Systems  All other systems reviewed and are negative.    Objective:  Neurologic Exam  Physical Exam Physical Examination:   Filed Vitals:   06/01/13 1209  BP: 116/68  Pulse: 91  Temp: 98 F (36.7 C)   General Examination: The patient is a very pleasant 79 y.o. female in no acute distress. She appears well-developed and well-nourished and well groomed. She is calm and cooperative.   HEENT: Normocephalic, atraumatic, pupils are equal, round and reactive to light and accommodation. Funduscopic exam is normal with sharp disc margins noted. Extraocular tracking is good without limitation to gaze excursion or nystagmus noted. Normal smooth pursuit is noted. Hearing is grossly intact. Face is symmetric with normal facial animation and normal facial sensation. Speech is clear with no dysarthria noted. There is no hypophonia. There is no lip, neck/head, jaw or voice tremor. Neck is supple with full range of passive and active motion. There are no carotid bruits on auscultation. Oropharynx exam reveals: moderate mouth dryness, adequate dental hygiene and mild airway crowding, due to redundant soft palate. Mallampati is class II. Tongue protrudes centrally and palate elevates symmetrically.   Chest: Clear to auscultation without wheezing, rhonchi or crackles noted.  Heart: S1+S2+0, regular and normal without murmurs, rubs or gallops noted.   Abdomen: Soft, non-tender and non-distended with normal bowel sounds appreciated on auscultation.  Extremities: There is no pitting edema in the distal lower extremities bilaterally.   Skin: Warm and dry without trophic changes noted. There are no varicose veins, but has spider veins.  Musculoskeletal: exam reveals no obvious joint deformities, tenderness or joint swelling or erythema.   Neurologically:  Mental status: The patient is awake, alert and oriented in  all 4 spheres. Her memory, attention, language and knowledge are appropriate. There is no aphasia, agnosia, apraxia or anomia. Speech is clear with normal prosody and enunciation. Thought process is linear. Mood is congruent and affect is normal.  Cranial nerves are as described above under HEENT exam. In addition, shoulder shrug is normal with equal shoulder height noted. Motor exam: Normal bulk, strength and tone is noted. There is no drift, tremor or rebound. Romberg is negative. Reflexes are 1+ in the UEs and absent in the LEs. Toes are downgoing bilaterally. Fine motor skills are intact with normal finger taps, normal hand movements, normal rapid alternating patting, normal foot taps and normal foot agility.  Cerebellar testing shows no dysmetria or intention tremor on finger to nose testing, but movements are deliberate. Heel to shin is unremarkable bilaterally. There is no truncal or gait ataxia.  Sensory exam is unchanged. Gait, station and balance: she stands up with mild difficulty and walks insecurely. No veering to one side is noted. No leaning to one side is noted. Posture is age-appropriate and stance is narrow based. No problems turning are noted. Tandem walk is difficult. Intact toe and heel stance is noted.               Assessment and Plan:   In summary, Melissa [name] is a very pleasant 79 y.o.-year old female with a approximately 4+ year history of dizziness, spells of tremulousness and syncope as well as presyncopal symptoms. Her exam is unchanged today and recent w/u included an unchanged MRI brain, N awake EEG and Carotid doppler studies without hemodynamically significant stenoses. She is being treated for anemia, which may have been a contributor to her symptoms. In addition she has evidence of mild neuropathy and last time have mild  orthostatic blood pressure drop. I suggested that she continue to stay well-hydrated, change positions slowly, and in the least use a cane for safety. I  have asked her to start using a rolling walker with a seat last time but she did not bring it in today and has not been using it lately. At this juncture I suggested that she followup with her primary care physician and I would not add any new test at this time from my end of things. I explained my findings and the recent workup to her and her granddaughter at length today and they were in agreement with the plan. I did not suggest any medication changes other than what we had talked about last time, namely to reduce sedating medications as much is possible such as her Vicodin use.

## 2013-06-10 ENCOUNTER — Ambulatory Visit (INDEPENDENT_AMBULATORY_CARE_PROVIDER_SITE_OTHER): Payer: Medicare Other | Admitting: Endocrinology

## 2013-06-10 ENCOUNTER — Encounter: Payer: Self-pay | Admitting: Endocrinology

## 2013-06-10 VITALS — BP 130/66 | HR 90 | Temp 98.7°F | Resp 12 | Ht 65.0 in | Wt 171.2 lb

## 2013-06-10 DIAGNOSIS — E039 Hypothyroidism, unspecified: Secondary | ICD-10-CM

## 2013-06-10 DIAGNOSIS — D649 Anemia, unspecified: Secondary | ICD-10-CM | POA: Diagnosis not present

## 2013-06-10 DIAGNOSIS — E785 Hyperlipidemia, unspecified: Secondary | ICD-10-CM | POA: Diagnosis not present

## 2013-06-10 DIAGNOSIS — I10 Essential (primary) hypertension: Secondary | ICD-10-CM

## 2013-06-10 DIAGNOSIS — E1149 Type 2 diabetes mellitus with other diabetic neurological complication: Secondary | ICD-10-CM | POA: Diagnosis not present

## 2013-06-10 MED ORDER — PREGABALIN 50 MG PO CAPS: 50.0000 mg | ORAL_CAPSULE | Freq: Three times a day (TID) | ORAL | Status: DC

## 2013-06-10 NOTE — Patient Instructions (Addendum)
Novolog 6 in am, 8 at other times

## 2013-06-10 NOTE — Progress Notes (Signed)
Patient ID: Melissa [name], female   DOB: August 23, 1928, 79 y.o.   MRN: [id number]  Melissa [name] is an 79 y.o. female.   Reason for Appointment: Diabetes follow-up   History of Present Illness   Diagnosis: Type 2 DIABETES MELITUS     The patient has been on a multidrug regimen and previously had poor control with A1c as high as 9.8% in 2013 Her blood sugars had improved with adding Victoza as well as mealtime insulin To her regimen of Levemir Her blood sugars have been fairly good with adding Victoza and has been able to maintain her weight better also  Oral hypoglycemic drugs: None        Side effects from medications: None Insulin regimen: 8 units AC NOVOLOG, LEVEMIR 34 UNITS HS VICTOZA 1.2 HS       Proper timing of medications in relation to meals: Yes.          Monitors blood glucose: Once a day.    Glucometer: One Touch.          Blood Glucose readings from meter download: readings before breakfast: About 2 weeks ago as high as 208 teen but recently range 113-138 after supper median 157 with range 105-216 and afternoon range 110-176 Overall median 141  Hypoglycemia frequency:  none, lowest reading 84 at noon.          Meals: 3 meals per day.  she usually has a protein in the form of an egg or peanut butter at breakfast, eating supper at 5 PM    Physical activity: exercise: Only some house work, not able to walk much because of balance issues           Dietician visit: Most recent: Unknown           The last HbgA1c was reported as 6.7 in 4/14    Last MICROALBUMIN level: Normal in 4/14   Wt Readings from Last 3 Encounters:  06/10/13 171 lb 3.2 oz (77.656 kg)  06/01/13 170 lb (77.111 kg)  03/11/13 175 lb (79.379 kg)      Medication List       This list is accurate as of: 06/10/13  2:49 PM.  Always use your most recent med list.               amitriptyline 25 MG tablet  Commonly known as:  ELAVIL  Take 25 mg by mouth at bedtime.     aspirin EC 81 MG tablet  Take 81 mg by  mouth daily.     atenolol 50 MG tablet  Commonly known as:  TENORMIN  Take 50 mg by mouth daily.     atorvastatin 20 MG tablet  Commonly known as:  LIPITOR  Take 20 mg by mouth daily.     BD PEN NEEDLE NANO U/F 32G X 4 MM Misc  Generic drug:  Insulin Pen Needle     cetirizine 10 MG tablet  Commonly known as:  ZYRTEC  Take 10 mg by mouth daily.     esomeprazole 40 MG capsule  Commonly known as:  NEXIUM  Take 40 mg by mouth 2 (two) times daily.     FLUoxetine 20 MG capsule  Commonly known as:  PROZAC  Take 20 mg by mouth daily.     HYDROcodone-acetaminophen 5-325 MG per tablet  Commonly known as:  NORCO/VICODIN  1 tablet every 6 (six) hours as needed.     INTEGRA F PO  Take 1 capsule by  mouth daily.     LEVEMIR [place]  Inject 8 Units into the skin 3 (three) times daily with meals.     levothyroxine 50 MCG tablet  Commonly known as:  SYNTHROID, LEVOTHROID  Take 1 tablet (50 mcg total) by mouth daily.     nitrofurantoin (macrocrystal-monohydrate) 100 MG capsule  Commonly known as:  MACROBID  Take 1 capsule by mouth daily.     NOVOLOG [place]  Inject 34 Units into the skin at bedtime and may repeat dose one time if needed.     ONE TOUCH ULTRA TEST test strip  Generic drug:  glucose blood     OSTEO BI-FLEX REGULAR STRENGTH PO  Take 1 tablet by mouth daily.     OVER THE COUNTER MEDICATION  Place 1 drop into both eyes every morning. Patient uses over the counter eye drop that begins with a "s".     polyethylene glycol powder powder  Commonly known as:  GLYCOLAX/MIRALAX  Take 17 g by mouth daily.     triamterene-hydrochlorothiazide 37.5-25 MG per tablet  Commonly known as:  MAXZIDE-25  Take 1 tablet by mouth daily.     VICTOZA [place]  Inject 1.2 Units into the skin at bedtime and may repeat dose one time if needed.     Vitamin D3 1000 UNITS Caps  Take 1 capsule by mouth daily.        Allergies: No Known Allergies  Past Medical History  Diagnosis Date  . Asthma    . Sleep apnea   . Angina   . Hypertension   . Blood transfusion   . GERD (gastroesophageal reflux disease)   . Headache(784.0)   . Arthritis   . Anxiety   . Pneumonia   . Diabetes mellitus   . Anemia   . Hiatal hernia   . Acute kidney injury 09/26/2011  . High cholesterol     Past Surgical History  Procedure Laterality Date  . Abdominal hysterectomy    . Appendectomy    . Eye surgery    . Tonsillectomy    . Breast surgery      Family History  Problem Relation Age of Onset  . Stroke Mother     Social History:  reports that she quit smoking about 51 years ago. She has never used smokeless tobacco. She reports that she does not drink alcohol or use illicit drugs.  Review of Systems:  HYPERTENSION:  appears to be very well controlled with Maxzide and atenolol  HYPERLIPIDEMIA: The lipid abnormality consists of elevated LDL treated with Lipitor, Last LDL 89 in April.  JOINT pain: She has these at night usually  Feet numb, has parasthesiae, gabapentin not tolerated because of  ? Palpitations  Having GI evaluation and CBC was requested by gastroenterologist   Also she has mild hypothyroidism treated with 50 mcg of levothyroxine, TSH to be done today     Examination:   BP 130/66  Pulse 90  Temp(Src) 98.7 F (37.1 C)  Resp 12  Ht 5\' 5"  (1.651 m)  Wt 171 lb 3.2 oz (77.656 kg)  BMI 28.49 kg/m2  SpO2 97%  Body mass index is 28.49 kg/(m^2).   No pedal edema No skin lesions on feet  ASSESSMENT/ PLAN::   Diabetes type 2   The patient's diabetes control appears to be excellent considering her age and multiple medical problems. She is having some variability in blood sugars in the evenings and blood sugars are occasionally near 200 Also not clear why  she had a series of high fasting readings about 10 days ago, possibly from UTI She is recently having excellent blood sugars in the mornings and reasonably good readings after lunch and supper She appears to be fairly  compliant with her multiple injection regimen and also checking blood sugar up to 3 times a day She does appear to be having mild hypoglycemic symptoms at lunchtime despite getting some protein at breakfast regularly  Will continue same insulin regimen except for  reducing the breakfast NovoLog to 6 units. Since her suppertime readings are not consistently high will not change the timing or regimen of Levemir as yet. Also she did not have consistently high readings at bedtime and will keep on the same mealtime coverage at supper  Continue Victoza as it has helped her before With the diabetes control and weight maintenance  She has symptomatic neuropathy/arthralgia in her feet and will give her trial of Lyrica, apparently did not tolerate gabapentin  HYPERLIPIDEMIA: To have followup levels today  Melissa [name] 06/10/2013, 2:49 PM   Addendum: A1c is excellent at 6.5  Office Visit on 06/10/2013  Component Date Value Range Status  . WBC 06/10/2013 10.3  4.5 - 10.5 K/uL Final  . RBC 06/10/2013 4.71  3.87 - 5.11 Mil/uL Final  . Hemoglobin 06/10/2013 12.9  12.0 - 15.0 g/dL Final  . HCT 06/10/2013 38.3  36.0 - 46.0 % Final  . MCV 06/10/2013 81.3  78.0 - 100.0 fl Final  . MCHC 06/10/2013 33.7  30.0 - 36.0 g/dL Final  . RDW 06/10/2013 18.7* 11.5 - 14.6 % Final  . Platelets 06/10/2013 323.0  150.0 - 400.0 K/uL Final  . Neutrophils Relative % 06/10/2013 72.4  43.0 - 77.0 % Final  . Lymphocytes Relative 06/10/2013 20.6  12.0 - 46.0 % Final  . Monocytes Relative 06/10/2013 4.4  3.0 - 12.0 % Final  . Eosinophils Relative 06/10/2013 2.2  0.0 - 5.0 % Final  . Basophils Relative 06/10/2013 0.4  0.0 - 3.0 % Final  . Neutro Abs 06/10/2013 7.4  1.4 - 7.7 K/uL Final  . Lymphs Abs 06/10/2013 2.1  0.7 - 4.0 K/uL Final  . Monocytes Absolute 06/10/2013 0.5  0.1 - 1.0 K/uL Final  . Eosinophils Absolute 06/10/2013 0.2  0.0 - 0.7 K/uL Final  . Basophils Absolute 06/10/2013 0.0  0.0 - 0.1 K/uL Final  . Sodium  06/10/2013 133* 135 - 145 mEq/L Final  . Potassium 06/10/2013 4.5  3.5 - 5.1 mEq/L Final  . Chloride 06/10/2013 98  96 - 112 mEq/L Final  . CO2 06/10/2013 28  19 - 32 mEq/L Final  . Glucose, Bld 06/10/2013 94  70 - 99 mg/dL Final  . BUN 06/10/2013 22  6 - 23 mg/dL Final  . Creatinine, Ser 06/10/2013 1.3* 0.4 - 1.2 mg/dL Final  . Total Bilirubin 06/10/2013 0.4  0.3 - 1.2 mg/dL Final  . Alkaline Phosphatase 06/10/2013 63  39 - 117 U/L Final  . AST 06/10/2013 17  0 - 37 U/L Final  . ALT 06/10/2013 14  0 - 35 U/L Final  . Total Protein 06/10/2013 7.5  6.0 - 8.3 g/dL Final  . Albumin 06/10/2013 4.1  3.5 - 5.2 g/dL Final  . Calcium 06/10/2013 9.4  8.4 - 10.5 mg/dL Final  . GFR 06/10/2013 41.93* >60.00 mL/min Final  . Hemoglobin A1C 06/10/2013 6.5  4.6 - 6.5 % Final   Glycemic Control Guidelines for People with Diabetes:Non Diabetic:  <6%Goal of Therapy: <7%Additional Action Suggested:  >  8%   . Cholesterol 06/10/2013 165  0 - 200 mg/dL Final   ATP III Classification       Desirable:  < 200 mg/dL               Borderline High:  200 - 239 mg/dL          High:  > = 240 mg/dL  . Triglycerides 06/10/2013 199.0* 0.0 - 149.0 mg/dL Final   Normal:  <150 mg/dLBorderline High:  150 - 199 mg/dL  . HDL 06/10/2013 39.00* >39.00 mg/dL Final  . VLDL 06/10/2013 39.8  0.0 - 40.0 mg/dL Final  . LDL Cholesterol 06/10/2013 86  0 - 99 mg/dL Final  . Total CHOL/HDL Ratio 06/10/2013 4   Final                  Men          Women1/2 Average Risk     3.4          3.3Average Risk          5.0          4.42X Average Risk          9.6          7.13X Average Risk          15.0          11.0                      . TSH 06/10/2013 1.73  0.35 - 5.50 uIU/mL Final

## 2013-06-11 LAB — CBC WITH DIFFERENTIAL/PLATELET
Basophils Absolute: 0 10*3/uL (ref 0.0–0.1)
Eosinophils Absolute: 0.2 10*3/uL (ref 0.0–0.7)
Hemoglobin: 12.9 g/dL (ref 12.0–15.0)
Lymphocytes Relative: 20.6 % (ref 12.0–46.0)
Lymphs Abs: 2.1 10*3/uL (ref 0.7–4.0)
MCHC: 33.7 g/dL (ref 30.0–36.0)
Neutro Abs: 7.4 10*3/uL (ref 1.4–7.7)
Platelets: 323 10*3/uL (ref 150.0–400.0)
RDW: 18.7 % — ABNORMAL HIGH (ref 11.5–14.6)

## 2013-06-11 LAB — LIPID PANEL
HDL: 39 mg/dL — ABNORMAL LOW (ref >39.00)
LDL Cholesterol: 86 mg/dL (ref 0–99)
Total CHOL/HDL Ratio: 4
Triglycerides: 199 mg/dL — ABNORMAL HIGH (ref 0.0–149.0)

## 2013-06-11 LAB — COMPREHENSIVE METABOLIC PANEL
ALT: 14 U/L (ref 0–35)
AST: 17 U/L (ref 0–37)
Chloride: 98 mEq/L (ref 96–112)
Creatinine, Ser: 1.3 mg/dL — ABNORMAL HIGH (ref 0.4–1.2)
Total Bilirubin: 0.4 mg/dL (ref 0.3–1.2)

## 2013-06-15 ENCOUNTER — Encounter: Payer: Self-pay | Admitting: Endocrinology

## 2013-06-15 DIAGNOSIS — E039 Hypothyroidism, unspecified: Secondary | ICD-10-CM | POA: Insufficient documentation

## 2013-06-15 HISTORY — DX: Hypothyroidism, unspecified: E03.9

## 2013-06-23 ENCOUNTER — Other Ambulatory Visit: Payer: Self-pay | Admitting: Family Medicine

## 2013-06-25 ENCOUNTER — Telehealth: Payer: Self-pay | Admitting: Endocrinology

## 2013-06-25 MED ORDER — LIRAGLUTIDE 18 MG/3ML ~~LOC~~ SOPN: 1.2000 mg | PEN_INJECTOR | Freq: Every day | SUBCUTANEOUS | Status: DC

## 2013-06-25 NOTE — Telephone Encounter (Signed)
90 day supply of Victosa. Pt requests script to be called in at [facility] on [address] / [address]

## 2013-07-27 ENCOUNTER — Other Ambulatory Visit: Payer: Self-pay | Admitting: Endocrinology

## 2013-08-23 ENCOUNTER — Other Ambulatory Visit: Payer: Self-pay | Admitting: Gastroenterology

## 2013-08-23 DIAGNOSIS — R112 Nausea with vomiting, unspecified: Secondary | ICD-10-CM

## 2013-09-01 ENCOUNTER — Encounter: Payer: Self-pay | Admitting: Cardiology

## 2013-09-02 ENCOUNTER — Ambulatory Visit (INDEPENDENT_AMBULATORY_CARE_PROVIDER_SITE_OTHER): Payer: Medicare Other | Admitting: Cardiology

## 2013-09-02 ENCOUNTER — Encounter: Payer: Self-pay | Admitting: Cardiology

## 2013-09-02 VITALS — BP 122/74 | HR 90 | Ht 65.0 in | Wt 169.0 lb

## 2013-09-02 DIAGNOSIS — I10 Essential (primary) hypertension: Secondary | ICD-10-CM | POA: Diagnosis not present

## 2013-09-02 DIAGNOSIS — I519 Heart disease, unspecified: Secondary | ICD-10-CM

## 2013-09-02 DIAGNOSIS — I428 Other cardiomyopathies: Secondary | ICD-10-CM

## 2013-09-02 DIAGNOSIS — E785 Hyperlipidemia, unspecified: Secondary | ICD-10-CM | POA: Diagnosis not present

## 2013-09-02 DIAGNOSIS — I429 Cardiomyopathy, unspecified: Secondary | ICD-10-CM

## 2013-09-02 NOTE — Progress Notes (Signed)
Melissa [name]. [address], [address], [place]  [address] Phone: [phone] Fax:  [phone]  Date:  09/02/2013   ID:  Melissa [name], DOB July 05, 1928, MRN [id number]  PCP:  [name], MD   History of Present Illness: Melissa [name] is a 79 y.o. female heart failure (since resolved). She has diabetes, hypertension, hyperlipidemia. Recent hospitalization in mid December 2012 secondary to altered mental status and increasing O2 demands. Her BNP was noted to be elevated at 3031.  Echocardiogram showed an EF of 45-50% mildly reduced. Chest x-ray showed no acute disease. She lost approximately 10 pounds of fluid do to IV Lasix. Her BNP was repeated and was 19. Her creatinine was 1.68. Sees Jamal Maes. Since the December hospitalization she has been doing quite well. She does note that she does have dyspnea on exertion with activity around the house such as stairs. In December she was noticing a "chest tightness "associated with this as well. Because of the chest discomfort, a stress test was performed on 11/08/11 and was low risk, no ischemia, normal EF actually.  She's not had any significant edema. Overall she is happy and doing well. Occasionally will have vertigo.   Wt Readings from Last 3 Encounters:  09/02/13 169 lb (76.658 kg)  06/10/13 171 lb 3.2 oz (77.656 kg)  06/01/13 170 lb (77.111 kg)     Past Medical History  Diagnosis Date  . Asthma   . Sleep apnea   . Angina   . Hypertension   . Blood transfusion   . GERD (gastroesophageal reflux disease)   . Headache(784.0)   . Arthritis   . Anxiety   . Pneumonia   . Diabetes mellitus   . Anemia   . Hiatal hernia   . Acute kidney injury 09/26/2011  . High cholesterol   . Unspecified hypothyroidism 06/15/2013    Past Surgical History  Procedure Laterality Date  . Abdominal hysterectomy    . Appendectomy    . Eye surgery    . Tonsillectomy    . Breast surgery      Current Outpatient Prescriptions    Medication Sig Dispense Refill  . amitriptyline (ELAVIL) 25 MG tablet Take 25 mg by mouth 2 (two) times daily.       Marland Kitchen aspirin EC 81 MG tablet Take 81 mg by mouth daily.        Marland Kitchen atenolol (TENORMIN) 50 MG tablet Take 50 mg by mouth daily.      Marland Kitchen atorvastatin (LIPITOR) 20 MG tablet Take 20 mg by mouth daily.        . BD PEN NEEDLE NANO U/F 32G X 4 MM MISC       . cetirizine (ZYRTEC) 10 MG tablet Take 10 mg by mouth daily.      . Cholecalciferol (VITAMIN D3) 1000 UNITS CAPS Take 1 capsule by mouth daily.      Marland Kitchen esomeprazole (NEXIUM) 40 MG capsule Take 40 mg by mouth 2 (two) times daily.        Marland Kitchen FLUoxetine (PROZAC) 20 MG capsule Take 20 mg by mouth daily.       . Insulin Aspart (NOVOLOG [place]) Inject 8 Units into the skin 3 (three) times daily.       Marland Kitchen LEVEMIR FLEXPEN 100 UNIT/ML SOPN INJECT 34 UNITS            SUBCUTANEOUSLY AT BEDTIME  45 mL  1  . levothyroxine (SYNTHROID, LEVOTHROID) 50 MCG tablet TAKE  1 TABLET BY MOUTH EVERY DAY  30 tablet  2  . Liraglutide (VICTOZA) 18 MG/3ML SOPN Inject 1.2 mg into the skin daily.  6 pen  5  . nitrofurantoin, macrocrystal-monohydrate, (MACROBID) 100 MG capsule Take 1 capsule by mouth daily.      . ONE TOUCH ULTRA TEST test strip       . OVER THE COUNTER MEDICATION Place 1 drop into both eyes every morning. Patient uses over the counter eye drop that begins with a "s".       . polyethylene glycol powder (GLYCOLAX/MIRALAX) powder Take 17 g by mouth daily.      . pregabalin (LYRICA) 50 MG capsule Take 1 capsule (50 mg total) by mouth 3 (three) times daily.  60 capsule  3  . triamterene-hydrochlorothiazide (MAXZIDE-25) 37.5-25 MG per tablet Take 1 tablet by mouth daily.         No current facility-administered medications for this visit.    Allergies:   No Known Allergies  Social History:  The patient  reports that she quit smoking about 51 years ago. She has never used smokeless tobacco. She reports that she does not drink alcohol or use illicit drugs.    ROS:  Please see the history of present illness.   Denies any syncope, bleeding, orthopnea, PND, shortness of breath  PHYSICAL EXAM: VS:  BP 122/74  Pulse 90  Ht 5\' 5"  (1.651 m)  Wt 169 lb (76.658 kg)  BMI 28.12 kg/m2 Well nourished, well developed, in no acute distress HEENT: normal Neck: no JVD Cardiac:  normal S1, S2; RRR; no murmur Lungs:  clear to auscultation bilaterally, no wheezing, rhonchi or rales Abd: soft, nontender, no hepatomegaly Ext: no edema Skin: warm and dry Neuro: no focal abnormalities noted  EKG:  None today     ASSESSMENT AND PLAN:  1. Cardiomyopathy-mildly reduced LV systolic dysfunction overall doing very well 2. Hypertension-currently very well controlled. No changes made. 3. Hyperlipidemia-continue with low-dose statin.  Signed, [name], MD [facility]  09/02/2013 3:29 PM

## 2013-09-02 NOTE — Patient Instructions (Addendum)
Your physician recommends that you continue on your current medications as directed. Please refer to the Current Medication list given to you today.  Your physician wants you to follow-up in: 6 months with Dr. Skains. You will receive a reminder letter in the mail two months in advance. If you don't receive a letter, please call our office to schedule the follow-up appointment.  

## 2013-09-06 DIAGNOSIS — B370 Candidal stomatitis: Secondary | ICD-10-CM | POA: Diagnosis not present

## 2013-09-06 DIAGNOSIS — K279 Peptic ulcer, site unspecified, unspecified as acute or chronic, without hemorrhage or perforation: Secondary | ICD-10-CM | POA: Diagnosis not present

## 2013-09-06 DIAGNOSIS — Z0000 Encounter for general adult medical examination without abnormal findings: Secondary | ICD-10-CM | POA: Diagnosis not present

## 2013-09-06 DIAGNOSIS — G609 Hereditary and idiopathic neuropathy, unspecified: Secondary | ICD-10-CM | POA: Diagnosis not present

## 2013-09-06 DIAGNOSIS — R32 Unspecified urinary incontinence: Secondary | ICD-10-CM | POA: Diagnosis not present

## 2013-09-06 DIAGNOSIS — R259 Unspecified abnormal involuntary movements: Secondary | ICD-10-CM | POA: Diagnosis not present

## 2013-09-06 DIAGNOSIS — Z6830 Body mass index (BMI) 30.0-30.9, adult: Secondary | ICD-10-CM | POA: Diagnosis not present

## 2013-09-06 DIAGNOSIS — Z23 Encounter for immunization: Secondary | ICD-10-CM | POA: Diagnosis not present

## 2013-09-06 DIAGNOSIS — N952 Postmenopausal atrophic vaginitis: Secondary | ICD-10-CM | POA: Diagnosis not present

## 2013-09-07 ENCOUNTER — Other Ambulatory Visit (INDEPENDENT_AMBULATORY_CARE_PROVIDER_SITE_OTHER): Payer: Medicare Other

## 2013-09-07 DIAGNOSIS — E1149 Type 2 diabetes mellitus with other diabetic neurological complication: Secondary | ICD-10-CM

## 2013-09-07 LAB — BASIC METABOLIC PANEL
Chloride: 98 mEq/L (ref 96–112)
GFR: 31.99 mL/min — ABNORMAL LOW (ref >60.00)
Potassium: 3.7 mEq/L (ref 3.5–5.1)
Sodium: 134 mEq/L — ABNORMAL LOW (ref 135–145)

## 2013-09-07 LAB — HEMOGLOBIN A1C: Hgb A1c MFr Bld: 6.6 % — ABNORMAL HIGH (ref 4.6–6.5)

## 2013-09-14 ENCOUNTER — Other Ambulatory Visit: Payer: Self-pay | Admitting: Gastroenterology

## 2013-09-14 ENCOUNTER — Ambulatory Visit
Admission: RE | Admit: 2013-09-14 | Discharge: 2013-09-14 | Disposition: A | Discharge status: Home / Self Care | Payer: Medicare Other | Source: Ambulatory Visit | Attending: Gastroenterology | Admitting: Gastroenterology

## 2013-09-14 DIAGNOSIS — R112 Nausea with vomiting, unspecified: Secondary | ICD-10-CM

## 2013-09-14 DIAGNOSIS — N83209 Unspecified ovarian cyst, unspecified side: Secondary | ICD-10-CM | POA: Diagnosis not present

## 2013-09-14 DIAGNOSIS — K8020 Calculus of gallbladder without cholecystitis without obstruction: Secondary | ICD-10-CM | POA: Diagnosis not present

## 2013-09-14 DIAGNOSIS — K5730 Diverticulosis of large intestine without perforation or abscess without bleeding: Secondary | ICD-10-CM | POA: Diagnosis not present

## 2013-09-16 ENCOUNTER — Ambulatory Visit (INDEPENDENT_AMBULATORY_CARE_PROVIDER_SITE_OTHER): Payer: Medicare Other | Admitting: Endocrinology

## 2013-09-16 ENCOUNTER — Encounter: Payer: Self-pay | Admitting: Endocrinology

## 2013-09-16 ENCOUNTER — Other Ambulatory Visit: Payer: Self-pay | Admitting: *Deleted

## 2013-09-16 VITALS — BP 124/54 | HR 96 | Temp 98.5°F | Resp 12 | Ht 65.0 in | Wt 171.9 lb

## 2013-09-16 DIAGNOSIS — E1149 Type 2 diabetes mellitus with other diabetic neurological complication: Secondary | ICD-10-CM | POA: Diagnosis not present

## 2013-09-16 DIAGNOSIS — E039 Hypothyroidism, unspecified: Secondary | ICD-10-CM

## 2013-09-16 DIAGNOSIS — N289 Disorder of kidney and ureter, unspecified: Secondary | ICD-10-CM

## 2013-09-16 DIAGNOSIS — I10 Essential (primary) hypertension: Secondary | ICD-10-CM

## 2013-09-16 MED ORDER — PREGABALIN 50 MG PO CAPS: 50.0000 mg | ORAL_CAPSULE | Freq: Three times a day (TID) | ORAL | Status: DC

## 2013-09-16 MED ORDER — PREGABALIN 50 MG PO CAPS: ORAL_CAPSULE | ORAL | Status: DC

## 2013-09-16 NOTE — Patient Instructions (Addendum)
Novolog 10 at supper

## 2013-09-16 NOTE — Progress Notes (Signed)
Patient ID: Melissa [name], female   DOB: 1928/07/24, 79 y.o.   MRN: [id number]  Melissa [name] is an 79 y.o. female.   Reason for Appointment: Diabetes follow-up   History of Present Illness   Diagnosis: Type 2 DIABETES MELITUS     The patient has been on a multidrug regimen and previously had poor control with A1c as high as 9.8% in 2013 Her blood sugars had improved with adding Victoza as well as mealtime insulin To her regimen of Levemir Her blood sugars have been fairly good with adding Victoza and has been able to maintain her weight better also She has been able to keep up with her multi-injection regimen without problems and gets help from family member Has not had any low blood sugars since her morning NovoLog was reduced to 6 units on the last visit  Recently however her blood sugars appear to be significantly high after supper, she is checking about 4-5 hours after eating  Oral hypoglycemic drugs: None        Side effects from medications: None Insulin regimen: 8 units AC NOVOLOG, LEVEMIR 34 UNITS HS VICTOZA 1.2 HS       Proper timing of medications in relation to meals: Yes.          Monitors blood glucose: Once a day.    Glucometer: One Touch.          Blood Glucose readings from meter download:  PREMEAL Breakfast Lunch Dinner Bedtime Overall  Glucose range:  97-145  104 114  142-211    Mean/median:  123     159   141     Hypoglycemia frequency:  none, lowest reading 97        Meals: 3 meals per day.  she usually has a protein in the form of an egg or peanut butter at breakfast, eating supper at 5 PM    Physical activity: exercise: Only some house work, not able to walk much because of balance issues           Dietician visit: Most recent: Unknown            Wt Readings from Last 3 Encounters:  09/16/13 171 lb 14.4 oz (77.973 kg)  09/02/13 169 lb (76.658 kg)  06/10/13 171 lb 3.2 oz (77.656 kg)   Lab Results  Component Value Date   HGBA1C 6.6* 09/07/2013   HGBA1C 6.5  06/10/2013   HGBA1C 7.6* 09/22/2011   Lab Results  Component Value Date   LDLCALC 86 06/10/2013   CREATININE 1.6* 09/07/2013   PROBLEM #2: Renal insufficiency:  She apparently had been followed by a nephrologist for renal insufficiency previously and not clear the etiology of this. Her creatinine is significantly higher at 1.6 compared to 1.3 on her last visit. She does not take any OTC nonsteroidal drugs and no change in her medications has been done     Medication List       This list is accurate as of: 09/16/13  3:20 PM.  Always use your most recent med list.               amitriptyline 25 MG tablet  Commonly known as:  ELAVIL  Take 25 mg by mouth 2 (two) times daily.     aspirin EC 81 MG tablet  Take 81 mg by mouth daily.     atenolol 50 MG tablet  Commonly known as:  TENORMIN  Take 50 mg by mouth daily.  atorvastatin 20 MG tablet  Commonly known as:  LIPITOR  Take 20 mg by mouth daily.     BD PEN NEEDLE NANO U/F 32G X 4 MM Misc  Generic drug:  Insulin Pen Needle     cetirizine 10 MG tablet  Commonly known as:  ZYRTEC  Take 10 mg by mouth daily.     esomeprazole 40 MG capsule  Commonly known as:  NEXIUM  Take 40 mg by mouth 2 (two) times daily.     FLUoxetine 20 MG capsule  Commonly known as:  PROZAC  Take 20 mg by mouth daily.     LEVEMIR FLEXPEN 100 UNIT/ML Sopn  Generic drug:  Insulin Detemir  INJECT 34 UNITS            SUBCUTANEOUSLY AT BEDTIME     levothyroxine 50 MCG tablet  Commonly known as:  SYNTHROID, LEVOTHROID  TAKE 1 TABLET BY MOUTH EVERY DAY     Liraglutide 18 MG/3ML Sopn  Commonly known as:  VICTOZA  Inject 1.2 mg into the skin daily.     nitrofurantoin (macrocrystal-monohydrate) 100 MG capsule  Commonly known as:  MACROBID  Take 1 capsule by mouth daily.     NOVOLOG [place]  Inject 8 Units into the skin 3 (three) times daily.     ONE TOUCH ULTRA TEST test strip  Generic drug:  glucose blood     OVER THE COUNTER MEDICATION   Place 1 drop into both eyes every morning. Patient uses over the counter eye drop that begins with a "s".     polyethylene glycol powder powder  Commonly known as:  GLYCOLAX/MIRALAX  Take 17 g by mouth daily.     pregabalin 50 MG capsule  Commonly known as:  LYRICA  Take 1 capsule (50 mg total) by mouth 3 (three) times daily.     triamterene-hydrochlorothiazide 37.5-25 MG per tablet  Commonly known as:  MAXZIDE-25  Take 1 tablet by mouth daily.     Vitamin D3 1000 UNITS Caps  Take 1 capsule by mouth daily.        Allergies: No Known Allergies  Past Medical History  Diagnosis Date  . Asthma   . Sleep apnea   . Angina   . Hypertension   . Blood transfusion   . GERD (gastroesophageal reflux disease)   . Headache(784.0)   . Arthritis   . Anxiety   . Pneumonia   . Diabetes mellitus   . Anemia   . Hiatal hernia   . Acute kidney injury 09/26/2011  . High cholesterol   . Unspecified hypothyroidism 06/15/2013  . Cardiomyopathy 09/02/2013    EF 45%, 12/12    Past Surgical History  Procedure Laterality Date  . Abdominal hysterectomy    . Appendectomy    . Eye surgery    . Tonsillectomy    . Breast surgery      Family History  Problem Relation Age of Onset  . Stroke Mother     Social History:  reports that she quit smoking about 52 years ago. She has never used smokeless tobacco. She reports that she does not drink alcohol or use illicit drugs.  Review of Systems:  HYPERTENSION:  blood pressure is low normal on 1/2 Maxzide and atenolol which she has taken for years. She is not sure if she has had any edema without Maxzide. She may have had heart failure during a hospitalization for pneumonia and ejection fraction previously was 45%  HYPERLIPIDEMIA: The lipid abnormality  consists of elevated LDL treated with Lipitor  JOINT pain: She has these at night usually  Feet numb, has parasthesiae, gabapentin not tolerated because of  ? Palpitations; better with Lyrica hs  which was started on her last visit Currently taking only 50 mg at bedtime and gets relief. Not symptomatic in the daytime as much  Also she has mild hypothyroidism treated with 50 mcg of levothyroxine with adequate control  Lab Results  Component Value Date   TSH 1.73 06/10/2013        Examination:   BP 124/54  Pulse 96  Temp(Src) 98.5 F (36.9 C)  Resp 12  Ht 5\' 5"  (1.651 m)  Wt 171 lb 14.4 oz (77.973 kg)  BMI 28.61 kg/m2  SpO2 99%  Body mass index is 28.61 kg/(m^2).   No pedal edema  ASSESSMENT/ PLAN::   Diabetes type 2   The patient's diabetes control appears to be good considering her age and multiple medical problems.  She is having higher blood sugars in the evenings and blood sugars are more consistently high at bedtime which is about 5 hours after her supper She is recently having excellent blood sugars in the mornings and reasonably good readings after lunch and supper She appears to be fairly compliant with her multiple injection regimen and also checking blood sugar at least twice a day She does appear to be having mild hypoglycemic symptoms at lunchtime despite getting some protein at breakfast regularly  Will continue same insulin regimen except for  increasing the suppertime NovoLog to 12 units.  Continue Victoza as it has helped her before With the diabetes control and weight maintenance  RENAL insufficiency: This is probably related to prerenal azotemia and would recommend stopping her low dose Maxzide and possibly reducing atenolol. She has not had any CAD and no history of diabetic nephropathy. Will discuss with cardiologist since not clear if she has had persistent decrease in ejection fraction previously  NEUROPATHY: She has symptomatic neuropathy/arthralgia in her feet and has benefited from taking Lyrica, apparently did not tolerate gabapentin. HYPERLIPIDEMIA:    Paton Crum 09/16/2013, 3:20 PM    Labs:  No visits with results within 1 Week(s) from  this visit. Latest known visit with results is:  Appointment on 09/07/2013  Component Date Value Range Status  . Hemoglobin A1C 09/07/2013 6.6* 4.6 - 6.5 % Final   Glycemic Control Guidelines for People with Diabetes:Non Diabetic:  <6%Goal of Therapy: <7%Additional Action Suggested:  >8%   . Sodium 09/07/2013 134* 135 - 145 mEq/L Final  . Potassium 09/07/2013 3.7  3.5 - 5.1 mEq/L Final  . Chloride 09/07/2013 98  96 - 112 mEq/L Final  . CO2 09/07/2013 29  19 - 32 mEq/L Final  . Glucose, Bld 09/07/2013 121* 70 - 99 mg/dL Final  . BUN 09/07/2013 23  6 - 23 mg/dL Final  . Creatinine, Ser 09/07/2013 1.6* 0.4 - 1.2 mg/dL Final  . Calcium 09/07/2013 9.0  8.4 - 10.5 mg/dL Final  . GFR 09/07/2013 31.99* >60.00 mL/min Final

## 2013-10-11 ENCOUNTER — Other Ambulatory Visit: Payer: Self-pay | Admitting: *Deleted

## 2013-10-11 MED ORDER — INSULIN DETEMIR 100 UNIT/ML FLEXPEN: PEN_INJECTOR | SUBCUTANEOUS | Status: DC

## 2013-10-18 ENCOUNTER — Other Ambulatory Visit (INDEPENDENT_AMBULATORY_CARE_PROVIDER_SITE_OTHER): Payer: Medicare Other

## 2013-10-18 DIAGNOSIS — E039 Hypothyroidism, unspecified: Secondary | ICD-10-CM

## 2013-10-18 DIAGNOSIS — N289 Disorder of kidney and ureter, unspecified: Secondary | ICD-10-CM | POA: Diagnosis not present

## 2013-10-18 LAB — COMPREHENSIVE METABOLIC PANEL
ALBUMIN: 4 g/dL (ref 3.5–5.2)
ALK PHOS: 51 U/L (ref 39–117)
ALT: 14 U/L (ref 0–35)
AST: 15 U/L (ref 0–37)
BUN: 20 mg/dL (ref 6–23)
CALCIUM: 8.8 mg/dL (ref 8.4–10.5)
CO2: 29 mEq/L (ref 19–32)
Chloride: 100 mEq/L (ref 96–112)
Creatinine, Ser: 1.4 mg/dL — ABNORMAL HIGH (ref 0.4–1.2)
GFR: 39.41 mL/min — ABNORMAL LOW (ref >60.00)
GLUCOSE: 112 mg/dL — AB (ref 70–99)
POTASSIUM: 3.9 meq/L (ref 3.5–5.1)
Sodium: 137 mEq/L (ref 135–145)
Total Bilirubin: 0.3 mg/dL (ref 0.3–1.2)
Total Protein: 7.1 g/dL (ref 6.0–8.3)

## 2013-10-18 LAB — TSH: TSH: 1.29 u[IU]/mL (ref 0.35–5.50)

## 2013-10-18 LAB — T4, FREE: Free T4: 0.95 ng/dL (ref 0.60–1.60)

## 2013-10-21 ENCOUNTER — Ambulatory Visit (INDEPENDENT_AMBULATORY_CARE_PROVIDER_SITE_OTHER): Payer: Medicare Other | Admitting: Endocrinology

## 2013-10-21 ENCOUNTER — Encounter: Payer: Self-pay | Admitting: Endocrinology

## 2013-10-21 VITALS — BP 124/72 | HR 110 | Temp 98.2°F | Resp 12 | Ht 65.0 in | Wt 174.0 lb

## 2013-10-21 DIAGNOSIS — N289 Disorder of kidney and ureter, unspecified: Secondary | ICD-10-CM | POA: Diagnosis not present

## 2013-10-21 DIAGNOSIS — E1149 Type 2 diabetes mellitus with other diabetic neurological complication: Secondary | ICD-10-CM

## 2013-10-21 DIAGNOSIS — E161 Other hypoglycemia: Secondary | ICD-10-CM | POA: Diagnosis not present

## 2013-10-21 DIAGNOSIS — I10 Essential (primary) hypertension: Secondary | ICD-10-CM

## 2013-10-21 NOTE — Progress Notes (Signed)
Patient ID: Melissa [name], female   DOB: 10-12-1928, 80 y.o.   MRN: [id number]   Reason for Appointment: Diabetes follow-up   History of Present Illness   Diagnosis: Type 2 DIABETES MELITUS     The patient has been on a multidrug regimen and previously had poor control with A1c as high as 9.8% in 2013 Her blood sugars had improved with adding Victoza as well as mealtime insulin To her regimen of Levemir Her blood sugars have been fairly good with adding Victoza and has been able to maintain her weight better also She has been able to keep up with her multi-injection regimen without problems and gets help from family member  Recent history:  Her blood sugars were relatively high after supper on the last visit and she was told to increase her dose of NovoLog However she appears to be taking the same dose of 8 units before every meal She has had one episode of nocturnal hypoglycemia at 2 AM and also felt weak and shaky around lunchtime today This is despite having a midmorning snack usually Blood sugars after supper are still relatively high but not consistently and overall about the same as before Overall average blood sugars are lower She takes 1.2 mg of Victoza at bedtime  Oral hypoglycemic drugs: None        Side effects from medications: None Insulin regimen: 8 units AC NOVOLOG, LEVEMIR 34 UNITS HS.  Proper timing of medications in relation to meals: Yes.          Monitors blood glucose: Once a day.    Glucometer: One Touch.          Blood Glucose readings from meter download:  PREMEAL Breakfast Lunch Dinner Bedtime  PC lunch   Glucose range:  82-119   79   106, 113   104-190   65, 97   Mean/median:  105     157       Overall median 112  Hypoglycemia: Lowest reading 48 at 2 AM, no other documented low sugars        Meals: 3 meals per day.  she usually has a protein in the form of an egg or peanut butter at breakfast, eating supper at 5 PM    Physical activity: exercise: Only some  house work, not able to walk much because of balance issues           Dietician visit: Most recent: Unknown            Wt Readings from Last 3 Encounters:  10/21/13 174 lb (78.926 kg)  09/16/13 171 lb 14.4 oz (77.973 kg)  09/02/13 169 lb (76.658 kg)   Lab Results  Component Value Date   HGBA1C 6.6* 09/07/2013   HGBA1C 6.5 06/10/2013   HGBA1C 7.6* 09/22/2011   Lab Results  Component Value Date   LDLCALC 86 06/10/2013   CREATININE 1.4* 10/18/2013    PROBLEM 2: Renal insufficiency:  She  had been followed by a nephrologist for renal insufficiency previously and not clear the etiology of this. Her creatinine was significantly higher at 1.6 on her last visit and her Maxzide was stopped because of low normal blood pressure. This was done after consultation with the cardiologist She has not had any swelling of her legs except once when she was standing a long time Currently taking only atenolol for hypertension and has not checked her blood pressure at home Creatinine is better at 1.4     Medication  List       This list is accurate as of: 10/21/13  4:19 PM.  Always use your most recent med list.               amitriptyline 25 MG tablet  Commonly known as:  ELAVIL  Take 25 mg by mouth 2 (two) times daily.     aspirin EC 81 MG tablet  Take 81 mg by mouth daily.     atenolol 50 MG tablet  Commonly known as:  TENORMIN  Take 50 mg by mouth daily.     atorvastatin 20 MG tablet  Commonly known as:  LIPITOR  Take 20 mg by mouth daily.     BD PEN NEEDLE NANO U/F 32G X 4 MM Misc  Generic drug:  Insulin Pen Needle     cetirizine 10 MG tablet  Commonly known as:  ZYRTEC  Take 10 mg by mouth daily.     esomeprazole 40 MG capsule  Commonly known as:  NEXIUM  Take 40 mg by mouth 2 (two) times daily.     FLUoxetine 20 MG capsule  Commonly known as:  PROZAC  Take 20 mg by mouth daily.     Insulin Detemir 100 UNIT/ML Pen  Commonly known as:  LEVEMIR FLEXPEN  Inject 34 units at  bedtime     levothyroxine 50 MCG tablet  Commonly known as:  SYNTHROID, LEVOTHROID  TAKE 1 TABLET BY MOUTH EVERY DAY     Liraglutide 18 MG/3ML Sopn  Commonly known as:  VICTOZA  Inject 1.2 mg into the skin daily.     nitrofurantoin (macrocrystal-monohydrate) 100 MG capsule  Commonly known as:  MACROBID  Take 1 capsule by mouth daily.     NOVOLOG [place]  Inject 8 Units into the skin 3 (three) times daily.     ONE TOUCH ULTRA TEST test strip  Generic drug:  glucose blood     OVER THE COUNTER MEDICATION  Place 1 drop into both eyes every morning. Patient uses over the counter eye drop that begins with a "s".     polyethylene glycol powder powder  Commonly known as:  GLYCOLAX/MIRALAX  Take 17 g by mouth daily.     pregabalin 50 MG capsule  Commonly known as:  LYRICA  Take 1 capsule daily     Vitamin D3 1000 UNITS Caps  Take 1 capsule by mouth daily.        Allergies: No Known Allergies  Past Medical History  Diagnosis Date  . Asthma   . Sleep apnea   . Angina   . Hypertension   . Blood transfusion   . GERD (gastroesophageal reflux disease)   . Headache(784.0)   . Arthritis   . Anxiety   . Pneumonia   . Diabetes mellitus   . Anemia   . Hiatal hernia   . Acute kidney injury 09/26/2011  . High cholesterol   . Unspecified hypothyroidism 06/15/2013  . Cardiomyopathy 09/02/2013    EF 45%, 12/12    Past Surgical History  Procedure Laterality Date  . Abdominal hysterectomy    . Appendectomy    . Eye surgery    . Tonsillectomy    . Breast surgery      Family History  Problem Relation Age of Onset  . Stroke Mother     Social History:  reports that she quit smoking about 52 years ago. She has never used smokeless tobacco. She reports that she does not drink alcohol or use  illicit drugs.  Review of Systems:  HYPERTENSION:  blood pressure is controlled with atenolol which she has taken for years.  Cardiac history: She may have had heart failure during a  hospitalization for pneumonia and ejection fraction previously was 45%  HYPERLIPIDEMIA: The lipid abnormality consists of elevated LDL treated with Lipitor  JOINT pain: She has these at night usually  Feet numb, has parasthesiae, gabapentin not tolerated because of  ? Palpitations; better with Lyrica hs which was started on her last visit Currently taking only 50 mg at bedtime and gets relief. Not symptomatic in the daytime as much  Also she has mild hypothyroidism treated with 50 mcg of levothyroxine with adequate control, TSH excellent  Lab Results  Component Value Date   TSH 1.29 10/18/2013        Examination:   BP 124/72  Pulse 110  Temp(Src) 98.2 F (36.8 C)  Resp 12  Ht 5\' 5"  (1.651 m)  Wt 174 lb (78.926 kg)  BMI 28.96 kg/m2  SpO2 94%  Body mass index is 28.96 kg/(m^2).   No pedal edema  ASSESSMENT/ PLAN::   Diabetes type 2   The patient's blood sugars are  overall lower since her last visit Also she has had at least one episode of hypoglycemia including overnight She only has some high readings after supper at night before bedtime and these may be partly related to less than a 24-hour effect of her Levemir She has not changed her diet She has been asked to reduce her dose of NovoLog at breakfast but has not done so and is still getting hypoglycemic before lunch  Will make the following changes:  Change evening Levemir to suppertime instead of bedtime  Reduce Levemir to 30 units  Reduce breakfast NovoLog to 6 units  Consider increasing NovoLog at suppertime if blood sugars still continue to be high  May take 2 units less at lunch if not eating much carbohydrate  Continue Victoza  Call if blood sugars are not consistently controlled or having more hypoglycemia  RENAL insufficiency: This is somewhat better with stopping her Maxzide and she has not had any edema with doing this and blood pressure is still relatively good with atenolol alone  Hypothyroidism:  TSH normal on 50 mcg again, no change needed  Jaylon Grode 10/21/2013, 4:19 PM    Labs:  Appointment on 10/18/2013  Component Date Value Range Status  . Sodium 10/18/2013 137  135 - 145 mEq/L Final  . Potassium 10/18/2013 3.9  3.5 - 5.1 mEq/L Final  . Chloride 10/18/2013 100  96 - 112 mEq/L Final  . CO2 10/18/2013 29  19 - 32 mEq/L Final  . Glucose, Bld 10/18/2013 112* 70 - 99 mg/dL Final  . BUN 10/18/2013 20  6 - 23 mg/dL Final  . Creatinine, Ser 10/18/2013 1.4* 0.4 - 1.2 mg/dL Final  . Total Bilirubin 10/18/2013 0.3  0.3 - 1.2 mg/dL Final  . Alkaline Phosphatase 10/18/2013 51  39 - 117 U/L Final  . AST 10/18/2013 15  0 - 37 U/L Final  . ALT 10/18/2013 14  0 - 35 U/L Final  . Total Protein 10/18/2013 7.1  6.0 - 8.3 g/dL Final  . Albumin 10/18/2013 4.0  3.5 - 5.2 g/dL Final  . Calcium 10/18/2013 8.8  8.4 - 10.5 mg/dL Final  . GFR 10/18/2013 39.41* >60.00 mL/min Final  . TSH 10/18/2013 1.29  0.35 - 5.50 uIU/mL Final  . Free T4 10/18/2013 0.95  0.60 - 1.60 ng/dL Final

## 2013-10-21 NOTE — Patient Instructions (Signed)
6 units At Breakfast NOVOLOG, 8 at  Other meals; less at lunch if eating less Cabs  LEVEMIR 30 UNITS at supper

## 2013-11-22 ENCOUNTER — Other Ambulatory Visit: Payer: Self-pay | Admitting: *Deleted

## 2013-11-22 MED ORDER — GLUCOSE BLOOD VI STRP: ORAL_STRIP | Status: DC

## 2013-11-23 ENCOUNTER — Other Ambulatory Visit: Payer: Self-pay | Admitting: *Deleted

## 2013-11-23 MED ORDER — GLUCOSE BLOOD VI STRP: ORAL_STRIP | Status: DC

## 2013-12-15 ENCOUNTER — Other Ambulatory Visit (INDEPENDENT_AMBULATORY_CARE_PROVIDER_SITE_OTHER): Payer: Medicare Other

## 2013-12-15 DIAGNOSIS — E1149 Type 2 diabetes mellitus with other diabetic neurological complication: Secondary | ICD-10-CM | POA: Diagnosis not present

## 2013-12-15 LAB — COMPREHENSIVE METABOLIC PANEL
ALBUMIN: 3.7 g/dL (ref 3.5–5.2)
ALT: 13 U/L (ref 0–35)
AST: 15 U/L (ref 0–37)
Alkaline Phosphatase: 57 U/L (ref 39–117)
BUN: 17 mg/dL (ref 6–23)
CALCIUM: 9.2 mg/dL (ref 8.4–10.5)
CHLORIDE: 101 meq/L (ref 96–112)
CO2: 30 mEq/L (ref 19–32)
Creatinine, Ser: 1.4 mg/dL — ABNORMAL HIGH (ref 0.4–1.2)
GFR: 38.1 mL/min — ABNORMAL LOW (ref >60.00)
Glucose, Bld: 57 mg/dL — ABNORMAL LOW (ref 70–99)
POTASSIUM: 4.2 meq/L (ref 3.5–5.1)
Sodium: 137 mEq/L (ref 135–145)
Total Bilirubin: 0.4 mg/dL (ref 0.3–1.2)
Total Protein: 7.1 g/dL (ref 6.0–8.3)

## 2013-12-15 LAB — MICROALBUMIN / CREATININE URINE RATIO
Creatinine,U: 96.7 mg/dL
MICROALB/CREAT RATIO: 0.1 mg/g (ref 0.0–30.0)
Microalb, Ur: 0.1 mg/dL (ref 0.0–1.9)

## 2013-12-15 LAB — URINALYSIS, ROUTINE W REFLEX MICROSCOPIC
BILIRUBIN URINE: NEGATIVE
Hgb urine dipstick: NEGATIVE
KETONES UR: NEGATIVE
Leukocytes, UA: NEGATIVE
Nitrite: POSITIVE — AB
Specific Gravity, Urine: 1.015 (ref 1.000–1.030)
TOTAL PROTEIN, URINE-UPE24: NEGATIVE
Urine Glucose: NEGATIVE
Urobilinogen, UA: 0.2 (ref 0.0–1.0)
pH: 6 (ref 5.0–8.0)

## 2013-12-15 LAB — LIPID PANEL
CHOL/HDL RATIO: 4
Cholesterol: 167 mg/dL (ref 0–200)
HDL: 39.8 mg/dL (ref >39.00)
LDL Cholesterol: 89 mg/dL (ref 0–99)
Triglycerides: 189 mg/dL — ABNORMAL HIGH (ref 0.0–149.0)
VLDL: 37.8 mg/dL (ref 0.0–40.0)

## 2013-12-15 LAB — HEMOGLOBIN A1C: HEMOGLOBIN A1C: 6.5 % (ref 4.6–6.5)

## 2013-12-16 ENCOUNTER — Other Ambulatory Visit: Payer: Medicare Other

## 2013-12-20 ENCOUNTER — Ambulatory Visit (INDEPENDENT_AMBULATORY_CARE_PROVIDER_SITE_OTHER): Payer: Medicare Other | Admitting: Endocrinology

## 2013-12-20 ENCOUNTER — Encounter: Payer: Self-pay | Admitting: Endocrinology

## 2013-12-20 VITALS — BP 124/74 | HR 100 | Temp 98.2°F | Resp 14 | Ht 65.0 in | Wt 172.6 lb

## 2013-12-20 DIAGNOSIS — N183 Chronic kidney disease, stage 3 unspecified: Secondary | ICD-10-CM | POA: Diagnosis not present

## 2013-12-20 DIAGNOSIS — E1149 Type 2 diabetes mellitus with other diabetic neurological complication: Secondary | ICD-10-CM

## 2013-12-20 DIAGNOSIS — E785 Hyperlipidemia, unspecified: Secondary | ICD-10-CM | POA: Diagnosis not present

## 2013-12-20 DIAGNOSIS — E039 Hypothyroidism, unspecified: Secondary | ICD-10-CM | POA: Diagnosis not present

## 2013-12-20 NOTE — Patient Instructions (Addendum)
Reduce am Novolog to 4 in am  8 Novolog at dinner when eating out

## 2013-12-20 NOTE — Progress Notes (Signed)
Patient ID: Melissa [name], female   DOB: 07-04-1928, 80 y.o.   MRN: [id number]   Reason for Appointment: Diabetes follow-up   History of Present Illness   Diagnosis: Type 2 DIABETES MELITUS     The patient has been on a multidrug regimen and previously had poor control with A1c as high as 9.8% in 2013 Her blood sugars had improved with adding Victoza as well as mealtime insulin To her regimen of Levemir Her blood sugars have been fairly good with adding Victoza and has been able to maintain her weight better also She has been able to keep up with her multi-injection regimen without problems and gets help from family member  Recent history: On her last visit the following recommendations were made  Change evening Levemir to suppertime instead of bedtime  Reduce Levemir to 30 units  Reduce breakfast NovoLog to 6 units  Consider increasing NovoLog at suppertime if blood sugars still continue to be high  May take 2 units less at lunch if not eating much carbohydrate  She thinks that the Levemir at suppertime her fasting readings were higher and she started taking it again at bedtime However she does not think she is having high readings after dinner except when eating out on Fridays as evident on her download t She did not increase her NovoLog when eating larger meals in the evening as discussed Also taking about the same dose of 6 units with every meal She thinks she may feel hypoglycemic lunchtime especially if eating late She takes 1.2 mg of Victoza at bedtime Overall control is excellent and somewhat more consistent compared to previous visits  Oral hypoglycemic drugs: None        Side effects from medications: None Insulin regimen:6 units AC NOVOLOG, LEVEMIR 30 hs   Proper timing of medications in relation to meals: Yes.          Monitors blood glucose: Once a day.    Glucometer: One Touch.          Blood Glucose readings from meter download:   PREMEAL Breakfast Lunch Dinner  Bedtime Overall  Glucose range:  110-149   74-139   91-150   109-215   74-215   Mean/median:   111    139  125     Hypoglycemia: If late for meals; No documented readings below 74         Meals: 3 meals per day.  she usually has a protein in the form of an egg or peanut butter at breakfast, eating supper at 6 PM    Physical activity: exercise: Only some house work, not able to walk much because of balance issues           Dietician visit: Most recent: Unknown            Wt Readings from Last 3 Encounters:  12/20/13 172 lb 9.6 oz (78.291 kg)  10/21/13 174 lb (78.926 kg)  09/16/13 171 lb 14.4 oz (77.973 kg)   Lab Results  Component Value Date   HGBA1C 6.5 12/15/2013   HGBA1C 6.6* 09/07/2013   HGBA1C 6.5 06/10/2013   Lab Results  Component Value Date   MICROALBUR 0.1 12/15/2013   LDLCALC 89 12/15/2013   CREATININE 1.4* 12/15/2013         Medication List       This list is accurate as of: 12/20/13  3:47 PM.  Always use your most recent med list.  amitriptyline 25 MG tablet  Commonly known as:  ELAVIL  Take 25 mg by mouth 2 (two) times daily.     aspirin EC 81 MG tablet  Take 81 mg by mouth daily.     atenolol 50 MG tablet  Commonly known as:  TENORMIN  Take 50 mg by mouth daily.     atorvastatin 20 MG tablet  Commonly known as:  LIPITOR  Take 20 mg by mouth daily.     BD PEN NEEDLE NANO U/F 32G X 4 MM Misc  Generic drug:  Insulin Pen Needle     cetirizine 10 MG tablet  Commonly known as:  ZYRTEC  Take 10 mg by mouth daily.     esomeprazole 40 MG capsule  Commonly known as:  NEXIUM  Take 40 mg by mouth 2 (two) times daily.     FLUoxetine 20 MG capsule  Commonly known as:  PROZAC  Take 20 mg by mouth daily.     glucose blood test strip  Commonly known as:  ONE TOUCH ULTRA TEST  Use to check blood sugars 4 times daily dx code 250.00     Insulin Detemir 100 UNIT/ML Pen  Commonly known as:  LEVEMIR  30 Units. Inject 34 units at bedtime      levothyroxine 50 MCG tablet  Commonly known as:  SYNTHROID, LEVOTHROID  TAKE 1 TABLET BY MOUTH EVERY DAY     Liraglutide 18 MG/3ML Sopn  Commonly known as:  VICTOZA  Inject 1.2 mg into the skin daily.     nitrofurantoin (macrocrystal-monohydrate) 100 MG capsule  Commonly known as:  MACROBID  Take 1 capsule by mouth daily.     NOVOLOG [place]  Inject 6 Units into the skin 3 (three) times daily.     OVER THE COUNTER MEDICATION  Place 1 drop into both eyes every morning. Patient uses over the counter eye drop that begins with a "s".     polyethylene glycol powder powder  Commonly known as:  GLYCOLAX/MIRALAX  Take 17 g by mouth daily.     pregabalin 50 MG capsule  Commonly known as:  LYRICA  Take 1 capsule daily     Vitamin D3 1000 UNITS Caps  Take 1 capsule by mouth daily.        Allergies: No Known Allergies  Past Medical History  Diagnosis Date  . Asthma   . Sleep apnea   . Angina   . Hypertension   . Blood transfusion   . GERD (gastroesophageal reflux disease)   . Headache(784.0)   . Arthritis   . Anxiety   . Pneumonia   . Diabetes mellitus   . Anemia   . Hiatal hernia   . Acute kidney injury 09/26/2011  . High cholesterol   . Unspecified hypothyroidism 06/15/2013  . Cardiomyopathy 09/02/2013    EF 45%, 12/12    Past Surgical History  Procedure Laterality Date  . Abdominal hysterectomy    . Appendectomy    . Eye surgery    . Tonsillectomy    . Breast surgery      Family History  Problem Relation Age of Onset  . Stroke Mother     Social History:  reports that she quit smoking about 52 years ago. She has never used smokeless tobacco. She reports that she does not drink alcohol or use illicit drugs.  Review of Systems:   Renal insufficiency: She  had been followed by a nephrologist for renal insufficiency previously and not clear  the etiology of this. Her creatinine was significantly higher at 1.6  previously  and her Maxzide was stopped because of  low normal blood pressure. Creatinine is  now stable at 1.4  HYPERTENSION:  blood pressure is controlled with atenolol which she has taken for years.  Cardiac history: She may have had heart failure during a hospitalization for pneumonia and ejection fraction previously was 45%  HYPERLIPIDEMIA: The lipid abnormality consists of elevated LDL treated with Lipitor. Has minimal increase in triglycerides but HDL is 40 and LDL below 100  Lab Results  Component Value Date   CHOL 167 12/15/2013   HDL 39.80 12/15/2013   LDLCALC 89 12/15/2013   TRIG 189.0* 12/15/2013   CHOLHDL 4 12/15/2013     JOINT pain: She has these at night usually  Feet numb, has parasthesiae, gabapentin not tolerated because of  ? Palpitations; better with Lyrica hs  Currently taking only 50 mg at bedtime and gets relief.   Also she has mild hypothyroidism treated with 50 mcg of levothyroxine with adequate control, TSH  normal recently  Lab Results  Component Value Date   TSH 1.29 10/18/2013        Examination:   BP 124/74  Pulse 100  Temp(Src) 98.2 F (36.8 C)  Resp 14  Ht 5\' 5"  (1.651 m)  Wt 172 lb 9.6 oz (78.291 kg)  BMI 28.72 kg/m2  SpO2 94%  Body mass index is 28.72 kg/(m^2).   Diabetic foot exam done No ankle edema   ASSESSMENT/ PLAN::   Diabetes type 2   The patient's blood sugars are  overall excellent since her last visit despite reducing her insula doses No further hypoglycemic symptoms or low sugars except when she is late for lunch and no documented readings below 70 Also she has had at least one episode of hypoglycemia including overnight Discussed taking 2 units extra for eating out and larger meals in the evenings She has been asked to reduce her dose of NovoLog at breakfast by 2 units and given instructions to do this No change in dosage of bedtime Levemir  NEUROPATHY: Symptomatically controlled, no sensory loss on exam  RENAL insufficiency: Mild and stable, no edema without her previous  regimen of Maxzide and will continue to watch  Counseling time over 50% of today's 25 minute visit  Shylo Zamor 12/20/2013, 3:47 PM    Labs:  Appointment on 12/15/2013  Component Date Value Ref Range Status  . Microalb, Ur 12/15/2013 0.1  0.0 - 1.9 mg/dL Final  . Creatinine,U 12/15/2013 96.7   Final  . Microalb Creat Ratio 12/15/2013 0.1  0.0 - 30.0 mg/g Final  . Color, Urine 12/15/2013 YELLOW  Yellow;Lt. Yellow Final  . APPearance 12/15/2013 CLEAR  Clear Final  . Specific Gravity, Urine 12/15/2013 1.015  1.000-1.030 Final  . pH 12/15/2013 6.0  5.0 - 8.0 Final  . Total Protein, Urine 12/15/2013 NEGATIVE  Negative Final  . Urine Glucose 12/15/2013 NEGATIVE  Negative Final  . Ketones, ur 12/15/2013 NEGATIVE  Negative Final  . Bilirubin Urine 12/15/2013 NEGATIVE  Negative Final  . Hgb urine dipstick 12/15/2013 NEGATIVE  Negative Final  . Urobilinogen, UA 12/15/2013 0.2  0.0 - 1.0 Final  . Leukocytes, UA 12/15/2013 NEGATIVE  Negative Final  . Nitrite 12/15/2013 POSITIVE* Negative Final  . WBC, UA 12/15/2013 3-6/hpf* 0-2/hpf Final  . Squamous Epithelial / LPF 12/15/2013 Rare(0-4/hpf)  Rare(0-4/hpf) Final  . Bacteria, UA 12/15/2013 Many(>50/hpf)* None Final  . Hemoglobin A1C 12/15/2013 6.5  4.6 -  6.5 % Final   Glycemic Control Guidelines for People with Diabetes:Non Diabetic:  <6%Goal of Therapy: <7%Additional Action Suggested:  >8%   . Sodium 12/15/2013 137  135 - 145 mEq/L Final  . Potassium 12/15/2013 4.2  3.5 - 5.1 mEq/L Final  . Chloride 12/15/2013 101  96 - 112 mEq/L Final  . CO2 12/15/2013 30  19 - 32 mEq/L Final  . Glucose, Bld 12/15/2013 57* 70 - 99 mg/dL Final  . BUN 12/15/2013 17  6 - 23 mg/dL Final  . Creatinine, Ser 12/15/2013 1.4* 0.4 - 1.2 mg/dL Final  . Total Bilirubin 12/15/2013 0.4  0.3 - 1.2 mg/dL Final  . Alkaline Phosphatase 12/15/2013 57  39 - 117 U/L Final  . AST 12/15/2013 15  0 - 37 U/L Final  . ALT 12/15/2013 13  0 - 35 U/L Final  . Total Protein 12/15/2013  7.1  6.0 - 8.3 g/dL Final  . Albumin 12/15/2013 3.7  3.5 - 5.2 g/dL Final  . Calcium 12/15/2013 9.2  8.4 - 10.5 mg/dL Final  . GFR 12/15/2013 38.10* >60.00 mL/min Final  . Cholesterol 12/15/2013 167  0 - 200 mg/dL Final   ATP III Classification       Desirable:  < 200 mg/dL               Borderline High:  200 - 239 mg/dL          High:  > = 240 mg/dL  . Triglycerides 12/15/2013 189.0* 0.0 - 149.0 mg/dL Final   Normal:  <150 mg/dLBorderline High:  150 - 199 mg/dL  . HDL 12/15/2013 39.80  >39.00 mg/dL Final  . VLDL 12/15/2013 37.8  0.0 - 40.0 mg/dL Final  . LDL Cholesterol 12/15/2013 89  0 - 99 mg/dL Final  . Total CHOL/HDL Ratio 12/15/2013 4   Final                  Men          Women1/2 Average Risk     3.4          3.3Average Risk          5.0          4.42X Average Risk          9.6          7.13X Average Risk          15.0          11.0

## 2014-01-23 ENCOUNTER — Other Ambulatory Visit: Payer: Self-pay | Admitting: Endocrinology

## 2014-01-24 DIAGNOSIS — E119 Type 2 diabetes mellitus without complications: Secondary | ICD-10-CM | POA: Diagnosis not present

## 2014-03-08 DIAGNOSIS — R259 Unspecified abnormal involuntary movements: Secondary | ICD-10-CM | POA: Diagnosis not present

## 2014-03-08 DIAGNOSIS — F325 Major depressive disorder, single episode, in full remission: Secondary | ICD-10-CM | POA: Diagnosis not present

## 2014-03-08 DIAGNOSIS — N760 Acute vaginitis: Secondary | ICD-10-CM | POA: Diagnosis not present

## 2014-03-08 DIAGNOSIS — D509 Iron deficiency anemia, unspecified: Secondary | ICD-10-CM | POA: Diagnosis not present

## 2014-03-10 ENCOUNTER — Other Ambulatory Visit (INDEPENDENT_AMBULATORY_CARE_PROVIDER_SITE_OTHER): Payer: Medicare Other

## 2014-03-10 DIAGNOSIS — E039 Hypothyroidism, unspecified: Secondary | ICD-10-CM | POA: Diagnosis not present

## 2014-03-10 DIAGNOSIS — E1149 Type 2 diabetes mellitus with other diabetic neurological complication: Secondary | ICD-10-CM

## 2014-03-10 LAB — BASIC METABOLIC PANEL
BUN: 20 mg/dL (ref 6–23)
CO2: 30 mEq/L (ref 19–32)
CREATININE: 1.3 mg/dL — AB (ref 0.4–1.2)
Calcium: 9.5 mg/dL (ref 8.4–10.5)
Chloride: 103 mEq/L (ref 96–112)
GFR: 41.85 mL/min — ABNORMAL LOW (ref >60.00)
Glucose, Bld: 110 mg/dL — ABNORMAL HIGH (ref 70–99)
POTASSIUM: 5.7 meq/L — AB (ref 3.5–5.1)
Sodium: 139 mEq/L (ref 135–145)

## 2014-03-10 LAB — TSH: TSH: 1.24 u[IU]/mL (ref 0.35–4.50)

## 2014-03-10 LAB — T4, FREE: FREE T4: 0.86 ng/dL (ref 0.60–1.60)

## 2014-03-10 LAB — HEMOGLOBIN A1C: HEMOGLOBIN A1C: 6.4 % (ref 4.6–6.5)

## 2014-03-14 ENCOUNTER — Ambulatory Visit (INDEPENDENT_AMBULATORY_CARE_PROVIDER_SITE_OTHER): Payer: Medicare Other | Admitting: Endocrinology

## 2014-03-14 ENCOUNTER — Other Ambulatory Visit: Payer: Self-pay | Admitting: *Deleted

## 2014-03-14 ENCOUNTER — Encounter: Payer: Self-pay | Admitting: Endocrinology

## 2014-03-14 VITALS — BP 132/68 | HR 92 | Temp 98.2°F | Resp 14 | Ht 65.0 in | Wt 172.2 lb

## 2014-03-14 DIAGNOSIS — N182 Chronic kidney disease, stage 2 (mild): Secondary | ICD-10-CM | POA: Diagnosis not present

## 2014-03-14 DIAGNOSIS — E1149 Type 2 diabetes mellitus with other diabetic neurological complication: Secondary | ICD-10-CM | POA: Diagnosis not present

## 2014-03-14 DIAGNOSIS — I498 Other specified cardiac arrhythmias: Secondary | ICD-10-CM

## 2014-03-14 DIAGNOSIS — R000 Tachycardia, unspecified: Secondary | ICD-10-CM

## 2014-03-14 DIAGNOSIS — E875 Hyperkalemia: Secondary | ICD-10-CM | POA: Diagnosis not present

## 2014-03-14 DIAGNOSIS — E119 Type 2 diabetes mellitus without complications: Secondary | ICD-10-CM

## 2014-03-14 DIAGNOSIS — E039 Hypothyroidism, unspecified: Secondary | ICD-10-CM

## 2014-03-14 LAB — POTASSIUM: Potassium: 5.1 mEq/L (ref 3.5–5.1)

## 2014-03-14 MED ORDER — PREGABALIN 50 MG PO CAPS: ORAL_CAPSULE | ORAL | Status: DC

## 2014-03-14 NOTE — Patient Instructions (Addendum)
Levemir 28 at  Supper  Reduce Novolog to 4 at breakfast  Victoza at supper  Low potassium diet

## 2014-03-14 NOTE — Progress Notes (Signed)
Patient ID: Melissa [name], female   DOB: 05/06/1928, 80 y.o.   MRN: [id number]   Reason for Appointment: Diabetes follow-up   History of Present Illness   Diagnosis: Type 2 DIABETES MELITUS     The patient has been on a multidrug regimen and previously had poor control with A1c as high as 9.8% in 2013 Her blood sugars had improved with adding Victoza as well as mealtime insulin To her regimen of Levemir Her blood sugars have been fairly good with adding Victoza and has been able to maintain her weight better also She has been able to keep up with her multi-injection regimen without problems and gets help from family member  Recent history: On her last visit the following recommendations were made  Change evening Levemir to suppertime instead of bedtime but she still taking at bedtime. Did forget this once in the evening  Continue  Levemir 30 units  Reduce breakfast NovoLog to 6 units and she has also reduced her lunch and dinnertime doses also  Continue blood sugar monitoring either before or after meals, she is doing this but her evening readings are usually late at night  May take 2 units less at lunch if not eating much carbohydrate  She takes 1.2 mg of Victoza at bedtime and is compliant with this. Overall control is excellent and has fewer readings in the high range compared to previous visits She still thinks that even with reducing her NovoLog in the morning she may have a tendency to low sugars before lunch No nocturnal hypoglycemia but her fasting readings are often below 100 A1c has been usually upper normal and is consistent  Oral hypoglycemic drugs: None        Side effects from medications: None Insulin regimen:6 units AC NOVOLOG, LEVEMIR 30 hs   Proper timing of medications in relation to meals: Yes.          Monitors blood glucose: Once a day.    Glucometer: One Touch.          Blood Glucose readings from meter download:   PREMEAL Breakfast Lunch Dinner Bedtime  Overall  Glucose range:  91-110   108-156   127-149   112-179    Mean/median:  95   125    160      Hypoglycemia: If late for meals; No documented readings below 79 recently        Meals: 3 meals per day.  she sometimes has a protein in the form of an egg or peanut butter at breakfast but sometimes only eating a muffin, eating supper at 6 PM    Physical activity: exercise: Only some house work, not able to walk much because of balance issues           Dietician visit: Most recent: Unknown            Wt Readings from Last 3 Encounters:  03/14/14 172 lb 3.2 oz (78.109 kg)  12/20/13 172 lb 9.6 oz (78.291 kg)  10/21/13 174 lb (78.926 kg)   Lab Results  Component Value Date   HGBA1C 6.4 03/10/2014   HGBA1C 6.5 12/15/2013   HGBA1C 6.6* 09/07/2013   Lab Results  Component Value Date   MICROALBUR 0.1 12/15/2013   LDLCALC 89 12/15/2013   CREATININE 1.3* 03/10/2014         Medication List       This list is accurate as of: 03/14/14 10:42 AM.  Always use your most recent med list.  amitriptyline 25 MG tablet  Commonly known as:  ELAVIL  Take 25 mg by mouth 2 (two) times daily.     aspirin EC 81 MG tablet  Take 81 mg by mouth daily.     atenolol 50 MG tablet  Commonly known as:  TENORMIN  Take 50 mg by mouth daily.     atorvastatin 20 MG tablet  Commonly known as:  LIPITOR  Take 20 mg by mouth daily.     BD PEN NEEDLE NANO U/F 32G X 4 MM Misc  Generic drug:  Insulin Pen Needle     cetirizine 10 MG tablet  Commonly known as:  ZYRTEC  Take 10 mg by mouth daily.     clotrimazole-betamethasone cream  Commonly known as:  LOTRISONE     esomeprazole 40 MG capsule  Commonly known as:  NEXIUM  Take 40 mg by mouth 2 (two) times daily.     FLUoxetine 20 MG capsule  Commonly known as:  PROZAC  Take 20 mg by mouth daily.     glucose blood test strip  Commonly known as:  ONE TOUCH ULTRA TEST  Use to check blood sugars 4 times daily dx code 250.00     LEVEMIR  FLEXTOUCH 100 UNIT/ML Pen  Generic drug:  Insulin Detemir  INJECT 30 UNITS            SUBCUTANEOUSLY AT BEDTIME     levothyroxine 50 MCG tablet  Commonly known as:  SYNTHROID, LEVOTHROID  TAKE 1 TABLET BY MOUTH EVERY DAY     Liraglutide 18 MG/3ML Sopn  Commonly known as:  VICTOZA  Inject 1.2 mg into the skin daily.     nitrofurantoin (macrocrystal-monohydrate) 100 MG capsule  Commonly known as:  MACROBID  Take 1 capsule by mouth daily.     NOVOLOG [place]  Inject 6 Units into the skin 3 (three) times daily.     OVER THE COUNTER MEDICATION  Place 1 drop into both eyes every morning. Patient uses over the counter eye drop that begins with a "s".     polyethylene glycol powder powder  Commonly known as:  GLYCOLAX/MIRALAX  Take 17 g by mouth daily.     pregabalin 50 MG capsule  Commonly known as:  LYRICA  Take up to 3 capsules daily as needed     Vitamin D3 1000 UNITS Caps  Take 1 capsule by mouth daily.        Allergies: No Known Allergies  Past Medical History  Diagnosis Date  . Asthma   . Sleep apnea   . Angina   . Hypertension   . Blood transfusion   . GERD (gastroesophageal reflux disease)   . Headache(784.0)   . Arthritis   . Anxiety   . Pneumonia   . Diabetes mellitus   . Anemia   . Hiatal hernia   . Acute kidney injury 09/26/2011  . High cholesterol   . Unspecified hypothyroidism 06/15/2013  . Cardiomyopathy 09/02/2013    EF 45%, 12/12    Past Surgical History  Procedure Laterality Date  . Abdominal hysterectomy    . Appendectomy    . Eye surgery    . Tonsillectomy    . Breast surgery      Family History  Problem Relation Age of Onset  . Stroke Mother     Social History:  reports that she quit smoking about 52 years ago. She has never used smokeless tobacco. She reports that she does not drink alcohol or  use illicit drugs.  Review of Systems:   Renal insufficiency: She  had been followed by a nephrologist for renal insufficiency previously  and not clear the etiology of this. Her creatinine was significantly higher at 1.6  previously  and her Maxzide was stopped because of low normal blood pressure. Creatinine is slightly better at 1.3  Hypokalemia: Her potassium was 5.7 and she has not taken any nonsteroidal anti-inflammatory drugs. Only on atenolol for hypertension  HYPERTENSION:  blood pressure is controlled with atenolol which she has taken for years and unable to reduce this because of rapid heart rate and sinus tachycardia which continues to persist   Cardiac history: She may have had heart failure during a hospitalization for pneumonia and ejection fraction previously was 45%  HYPERLIPIDEMIA: The lipid abnormality consists of elevated LDL treated with Lipitor. Has minimal increase in triglycerides but HDL is 40 and LDL below 100  Lab Results  Component Value Date   CHOL 167 12/15/2013   HDL 39.80 12/15/2013   LDLCALC 89 12/15/2013   TRIG 189.0* 12/15/2013   CHOLHDL 4 12/15/2013    JOINT pain: Mild  Feet numb, has parasthesiae, gabapentin not tolerated because of  ? Palpitations; better with Lyrica hs  Currently taking only 50 mg at bedtime and gets relief.   Also she has mild hypothyroidism treated with 50 mcg of levothyroxine with adequate control, TSH  normal recently  Lab Results  Component Value Date   TSH 1.24 03/10/2014   Diabetic foot exam done in 3/15     Examination:   BP 132/68  Pulse 92  Temp(Src) 98.2 F (36.8 C)  Resp 14  Ht 5\' 5"  (1.651 m)  Wt 172 lb 3.2 oz (78.109 kg)  BMI 28.66 kg/m2  SpO2 96%  Body mass index is 28.66 kg/(m^2).    No ankle edema   ASSESSMENT/ PLAN::   Diabetes type 2   The patient's blood sugars are  overall excellent and she is requiring less coverage at mealtimes She is fairly compliant with checking her blood sugars However sometimes will forget her doses at bedtime of Levemir and Victoza She is also not getting enough protein at breakfast consistently and may  tend to get low readings at lunch although not recently documented. Discussed sources of protein at breakfast Discussed blood sugar targets Insulin changes: She will reduce her Levemir by 2 units at breakfast coverage by 2 units also No change in dosage of bedtime Levemir but will try to take this at suppertime for better compliance and convenience along with Victoza  NEUROPATHY: Symptomatically controlled, no sensory loss on exam previously  RENAL insufficiency: Mild and stable, no edema without her previous regimen of Maxzide   Hypokalemia: Given list of high potassium foods and will check her level again today  Counseling time over 50% of today's 25 minute visit  [name] 03/14/2014, 10:42 AM    Labs:  Appointment on 03/10/2014  Component Date Value Ref Range Status  . Hemoglobin A1C 03/10/2014 6.4  4.6 - 6.5 % Final   Glycemic Control Guidelines for People with Diabetes:Non Diabetic:  <6%Goal of Therapy: <7%Additional Action Suggested:  >8%   . Sodium 03/10/2014 139  135 - 145 mEq/L Final  . Potassium 03/10/2014 5.7* 3.5 - 5.1 mEq/L Final  . Chloride 03/10/2014 103  96 - 112 mEq/L Final  . CO2 03/10/2014 30  19 - 32 mEq/L Final  . Glucose, Bld 03/10/2014 110* 70 - 99 mg/dL Final  . BUN 03/10/2014  20  6 - 23 mg/dL Final  . Creatinine, Ser 03/10/2014 1.3* 0.4 - 1.2 mg/dL Final  . Calcium 03/10/2014 9.5  8.4 - 10.5 mg/dL Final  . GFR 03/10/2014 41.85* >60.00 mL/min Final  . TSH 03/10/2014 1.24  0.35 - 4.50 uIU/mL Final  . Free T4 03/10/2014 0.86  0.60 - 1.60 ng/dL Final

## 2014-03-14 NOTE — Progress Notes (Signed)
Quick Note:  Please let patient know that the potassium level is upper normal, only need to restrict bananas, citrus fruits, baked potatoes ______

## 2014-03-18 ENCOUNTER — Other Ambulatory Visit: Payer: Medicare Other

## 2014-03-20 ENCOUNTER — Other Ambulatory Visit: Payer: Self-pay | Admitting: Endocrinology

## 2014-03-20 ENCOUNTER — Encounter: Payer: Self-pay | Admitting: Family Medicine

## 2014-03-23 ENCOUNTER — Ambulatory Visit: Payer: Medicare Other | Admitting: Endocrinology

## 2014-04-19 ENCOUNTER — Encounter: Payer: Self-pay | Admitting: Cardiology

## 2014-04-19 ENCOUNTER — Ambulatory Visit (INDEPENDENT_AMBULATORY_CARE_PROVIDER_SITE_OTHER): Payer: Medicare Other | Admitting: Cardiology

## 2014-04-19 VITALS — BP 100/56 | HR 64 | Ht 65.0 in | Wt 172.0 lb

## 2014-04-19 DIAGNOSIS — I10 Essential (primary) hypertension: Secondary | ICD-10-CM | POA: Insufficient documentation

## 2014-04-19 DIAGNOSIS — I428 Other cardiomyopathies: Secondary | ICD-10-CM

## 2014-04-19 DIAGNOSIS — I420 Dilated cardiomyopathy: Secondary | ICD-10-CM

## 2014-04-19 DIAGNOSIS — N183 Chronic kidney disease, stage 3 unspecified: Secondary | ICD-10-CM

## 2014-04-19 DIAGNOSIS — E785 Hyperlipidemia, unspecified: Secondary | ICD-10-CM | POA: Diagnosis not present

## 2014-04-19 MED ORDER — ATENOLOL 25 MG PO TABS: 25.0000 mg | ORAL_TABLET | Freq: Every day | ORAL | Status: DC

## 2014-04-19 NOTE — Patient Instructions (Signed)
Please decrease Atenolol to 25 mg a day. Continue all other medications as listed.  Follow up in 1 year with Dr [name].  You will receive a letter in the mail 2 months before you are due.  Please call us when you receive this letter to schedule your follow up appointment.

## 2014-04-19 NOTE — Progress Notes (Signed)
[facility]. [address], [place]  [address] Phone: [phone] Fax:  [phone]  Date:  04/19/2014   ID:  Melissa [name], DOB 10-06-1928, MRN [id number]  PCP:  [name], MD   History of Present Illness: Melissa [name] is a 80 y.o. female heart failure (since resolved). She has diabetes, hypertension, hyperlipidemia. Prior hospitalization in mid December 2012 secondary to altered mental status and increasing O2 demands. Her BNP was noted to be elevated at 3031.   Echocardiogram showed an EF of 45-50% mildly reduced. Chest x-ray showed no acute disease. She lost approximately 10 pounds of fluid do to IV Lasix. Her BNP was repeated and was 19. Her creatinine was 1.68.  Stress test was performed on 11/08/11 and was low risk, no ischemia, normal EF actually.   [name] took off Maxide. BP still seems low to daughter. Since stopping Maxide she has had occasional days with mild edema although this is rare. Overall she's doing quite well.   Wt Readings from Last 3 Encounters:  04/19/14 172 lb (78.019 kg)  03/14/14 172 lb 3.2 oz (78.109 kg)  12/20/13 172 lb 9.6 oz (78.291 kg)     Past Medical History  Diagnosis Date  . Asthma   . Sleep apnea   . Angina   . Hypertension   . Blood transfusion   . GERD (gastroesophageal reflux disease)   . Headache(784.0)   . Arthritis   . Anxiety   . Pneumonia   . Diabetes mellitus   . Anemia   . Hiatal hernia   . Acute kidney injury 09/26/2011  . High cholesterol   . Unspecified hypothyroidism 06/15/2013  . Cardiomyopathy 09/02/2013    EF 45%, 12/12    Past Surgical History  Procedure Laterality Date  . Abdominal hysterectomy    . Appendectomy    . Eye surgery    . Tonsillectomy    . Breast surgery      Current Outpatient Prescriptions  Medication Sig Dispense Refill  . amitriptyline (ELAVIL) 25 MG tablet Take 25 mg by mouth 2 (two) times daily.       Marland Kitchen aspirin EC 81 MG tablet Take 81 mg by mouth daily.          Marland Kitchen atenolol (TENORMIN) 50 MG tablet Take 50 mg by mouth daily.      Marland Kitchen atorvastatin (LIPITOR) 20 MG tablet Take 20 mg by mouth daily.        . BD PEN NEEDLE NANO U/F 32G X 4 MM MISC       . cetirizine (ZYRTEC) 10 MG tablet Take 10 mg by mouth daily.      . Cholecalciferol (VITAMIN D3) 1000 UNITS CAPS Take 1 capsule by mouth daily.      . clotrimazole-betamethasone (LOTRISONE) cream       . esomeprazole (NEXIUM) 40 MG capsule Take 40 mg by mouth 2 (two) times daily.        Marland Kitchen FLUoxetine (PROZAC) 20 MG capsule Take 20 mg by mouth daily.       . Insulin Aspart (NOVOLOG [place]) Inject 6 Units into the skin 3 (three) times daily.       . Insulin Detemir (LEVEMIR FLEXTOUCH) 100 UNIT/ML Pen INJECT 28 UNITS            SUBCUTANEOUSLY AT BEDTIME      . levothyroxine (SYNTHROID, LEVOTHROID) 50 MCG tablet TAKE 1 TABLET BY MOUTH EVERY DAY  30 tablet  2  . Liraglutide (VICTOZA) 18 MG/3ML SOPN Inject 1.2 mg into the skin daily.  6 pen  5  . nitrofurantoin, macrocrystal-monohydrate, (MACROBID) 100 MG capsule Take 1 capsule by mouth daily.      . ONE TOUCH ULTRA TEST test strip USE TO CHECK BLOOD SUGAR 4 TIMES DAILY  125 each  3  . polyethylene glycol powder (GLYCOLAX/MIRALAX) powder Take 17 g by mouth daily.      . pregabalin (LYRICA) 50 MG capsule Take up to 3 capsules daily as needed  90 capsule  1  . triamterene-hydrochlorothiazide (MAXZIDE-25) 37.5-25 MG per tablet        No current facility-administered medications for this visit.    Allergies:   No Known Allergies  Social History:  The patient  reports that she quit smoking about 52 years ago. She has never used smokeless tobacco. She reports that she does not drink alcohol or use illicit drugs.   ROS:  Please see the history of present illness.   Denies any syncope, bleeding, orthopnea, PND, shortness of breath  PHYSICAL EXAM: VS:  BP 100/56  Pulse 64  Ht 5\' 5"  (1.651 m)  Wt 172 lb (78.019 kg)  BMI 28.62 kg/m2 Well nourished, well developed, in no  acute distress HEENT: normal Neck: no JVD Cardiac:  normal S1, S2; RRR; no murmur Lungs:  clear to auscultation bilaterally, no wheezing, rhonchi or rales Abd: soft, nontender, no hepatomegaly Ext: no edema today Skin: warm and dry Neuro: no focal abnormalities noted  EKG:  None today     ASSESSMENT AND PLAN:  1. Cardiomyopathy-mildly reduced LV systolic dysfunction overall doing very well. No diuretic therapy currently because of relative hypotension. In fact, her last stress test showed a normal ejection fraction. 2. Hypertension-currently very well controlled. In fact, mildly low. Because of this, we will reduce her atenolol from 50 mg down to 25 mg. 3. Hyperlipidemia-continue with low-dose statin. LDL 89 on 12/15/13 4. Chronic kidney disease stage III-creatinine 1.3 at last check. Avoid NSAIDs  Signed, [name], MD [facility]  04/19/2014 3:05 PM

## 2014-05-02 ENCOUNTER — Telehealth: Payer: Self-pay | Admitting: *Deleted

## 2014-05-02 NOTE — Telephone Encounter (Signed)
Reduce Levemir to 26 from the 28 units and reduce all doses of NovoLog by 2 units. She can skip her suppertime dose today

## 2014-05-02 NOTE — Telephone Encounter (Signed)
Patients daughter called and said her moms sugar was 102 fasting, she ate then took her insulin and her sugar dropped to lower 60's and has only been in the low 70's all day.   CB# [phone]

## 2014-05-02 NOTE — Telephone Encounter (Signed)
Noted, daughter is aware

## 2014-05-06 ENCOUNTER — Other Ambulatory Visit: Payer: Self-pay | Admitting: *Deleted

## 2014-05-06 ENCOUNTER — Telehealth: Payer: Self-pay | Admitting: Endocrinology

## 2014-05-06 DIAGNOSIS — E1149 Type 2 diabetes mellitus with other diabetic neurological complication: Secondary | ICD-10-CM

## 2014-05-06 MED ORDER — PREGABALIN 50 MG PO CAPS: ORAL_CAPSULE | ORAL | Status: DC

## 2014-05-06 NOTE — Telephone Encounter (Signed)
Pt's daughter Cathren Laine has many questions regarding levemir and the lyrica

## 2014-05-17 DIAGNOSIS — R300 Dysuria: Secondary | ICD-10-CM | POA: Diagnosis not present

## 2014-05-17 DIAGNOSIS — N390 Urinary tract infection, site not specified: Secondary | ICD-10-CM | POA: Diagnosis not present

## 2014-06-13 ENCOUNTER — Other Ambulatory Visit: Payer: Medicare Other

## 2014-06-14 ENCOUNTER — Other Ambulatory Visit: Payer: Medicare Other

## 2014-06-14 DIAGNOSIS — E039 Hypothyroidism, unspecified: Secondary | ICD-10-CM | POA: Diagnosis not present

## 2014-06-14 DIAGNOSIS — E1149 Type 2 diabetes mellitus with other diabetic neurological complication: Secondary | ICD-10-CM | POA: Diagnosis not present

## 2014-06-16 ENCOUNTER — Ambulatory Visit (INDEPENDENT_AMBULATORY_CARE_PROVIDER_SITE_OTHER): Payer: Medicare Other | Admitting: Endocrinology

## 2014-06-16 ENCOUNTER — Encounter: Payer: Self-pay | Admitting: Endocrinology

## 2014-06-16 VITALS — BP 124/62 | HR 82 | Temp 98.3°F | Resp 16 | Wt 176.6 lb

## 2014-06-16 DIAGNOSIS — E1149 Type 2 diabetes mellitus with other diabetic neurological complication: Secondary | ICD-10-CM | POA: Diagnosis not present

## 2014-06-16 DIAGNOSIS — E875 Hyperkalemia: Secondary | ICD-10-CM | POA: Diagnosis not present

## 2014-06-16 DIAGNOSIS — Z23 Encounter for immunization: Secondary | ICD-10-CM | POA: Diagnosis not present

## 2014-06-16 DIAGNOSIS — E039 Hypothyroidism, unspecified: Secondary | ICD-10-CM | POA: Diagnosis not present

## 2014-06-16 NOTE — Patient Instructions (Addendum)
Reduce Levemir to 26 units   Novolog 6 at supper if eating bigger meal or a sweet, take pen with you when eating out

## 2014-06-16 NOTE — Progress Notes (Signed)
Patient ID: Melissa [name], female   DOB: 24-Jun-1928, 80 y.o.   MRN: [id number]   Reason for Appointment: Diabetes follow-up   History of Present Illness   Diagnosis: Type 2 DIABETES MELITUS     The patient has been on a multidrug regimen and previously had poor control with A1c as high as 9.8% in 2013 Her blood sugars had improved with adding Victoza as well as mealtime insulin To her regimen of Levemir Her blood sugars have been fairly good with adding Victoza and has been able to maintain her weight better also She has been able to keep up with her multi-injection regimen without problems and gets help from family member  Recent history:   She has been requiring somewhat less insulin over the last few months and is only taking 4 units of NovoLog with meals Her Levemir also has been reduced gradually Blood sugars are excellent overall except for a few high readings at night and her last A1c was upper normal She takes 1.2 mg of Victoza at bedtime and is compliant with this. Fasting readings are mostly just over 100, not as low as before She does not adjust her suppertime insulin based on meal size and will sometimes have high readings based on what she is eating  Oral hypoglycemic drugs: None        Side effects from medications: None Insulin regimen:4 units AC NOVOLOG, LEVEMIR 28 hs   Proper timing of medications in relation to meals: Yes.          Monitors blood glucose: Once a day.    Glucometer: One Touch.          Blood Glucose readings from meter download:   PREMEAL Breakfast Lunch Dinner Bedtime Overall  Glucose range:  92-123   72-148   102-189   107-223    Mean/median:  106   95    154   122     Hypoglycemia: None recently        Meals: 3 meals per day.  she sometimes has a protein in the form of an egg or peanut butter at breakfast but sometimes only eating a muffin, eating supper at 6 PM    Physical activity: exercise: Only some house work, not able to walk much because  of balance issues           Dietician visit: Most recent: Unknown            Wt Readings from Last 3 Encounters:  06/16/14 176 lb 9.6 oz (80.105 kg)  04/19/14 172 lb (78.019 kg)  03/14/14 172 lb 3.2 oz (78.109 kg)   Lab Results  Component Value Date   HGBA1C 6.4 03/10/2014   HGBA1C 6.5 12/15/2013   HGBA1C 6.6* 09/07/2013   Lab Results  Component Value Date   MICROALBUR 0.1 12/15/2013   LDLCALC 89 12/15/2013   CREATININE 1.3* 03/10/2014         Medication List       This list is accurate as of: 06/16/14  3:48 PM.  Always use your most recent med list.               amitriptyline 25 MG tablet  Commonly known as:  ELAVIL  Take 25 mg by mouth 2 (two) times daily.     aspirin EC 81 MG tablet  Take 81 mg by mouth daily.     atenolol 25 MG tablet  Commonly known as:  TENORMIN  Take 1 tablet (25  mg total) by mouth daily.     atorvastatin 20 MG tablet  Commonly known as:  LIPITOR  Take 20 mg by mouth daily.     BD PEN NEEDLE NANO U/F 32G X 4 MM Misc  Generic drug:  Insulin Pen Needle     cetirizine 10 MG tablet  Commonly known as:  ZYRTEC  Take 10 mg by mouth daily.     clotrimazole-betamethasone cream  Commonly known as:  LOTRISONE     esomeprazole 40 MG capsule  Commonly known as:  NEXIUM  Take 40 mg by mouth 2 (two) times daily.     FLUoxetine 20 MG capsule  Commonly known as:  PROZAC  Take 20 mg by mouth daily.     LEVEMIR FLEXTOUCH 100 UNIT/ML Pen  Generic drug:  Insulin Detemir  INJECT 28 UNITS            SUBCUTANEOUSLY AT BEDTIME     levothyroxine 50 MCG tablet  Commonly known as:  SYNTHROID, LEVOTHROID  TAKE 1 TABLET BY MOUTH EVERY DAY     Liraglutide 18 MG/3ML Sopn  Commonly known as:  VICTOZA  Inject 1.2 mg into the skin daily.     nitrofurantoin (macrocrystal-monohydrate) 100 MG capsule  Commonly known as:  MACROBID  Take 1 capsule by mouth daily.     NOVOLOG [place]  Inject 4 Units into the skin 3 (three) times daily.     ONE TOUCH ULTRA TEST  test strip  Generic drug:  glucose blood  USE TO CHECK BLOOD SUGAR 4 TIMES DAILY     polyethylene glycol powder powder  Commonly known as:  GLYCOLAX/MIRALAX  Take 17 g by mouth daily.     pregabalin 50 MG capsule  Commonly known as:  LYRICA  Take up to 3 capsules daily as needed     sulfamethoxazole-trimethoprim 800-160 MG per tablet  Commonly known as:  BACTRIM DS     triamterene-hydrochlorothiazide 37.5-25 MG per tablet  Commonly known as:  MAXZIDE-25     Vitamin D3 1000 UNITS Caps  Take 1 capsule by mouth daily.        Allergies: No Known Allergies  Past Medical History  Diagnosis Date  . Asthma   . Sleep apnea   . Angina   . Hypertension   . Blood transfusion   . GERD (gastroesophageal reflux disease)   . Headache(784.0)   . Arthritis   . Anxiety   . Pneumonia   . Diabetes mellitus   . Anemia   . Hiatal hernia   . Acute kidney injury 09/26/2011  . High cholesterol   . Unspecified hypothyroidism 06/15/2013  . Cardiomyopathy 09/02/2013    EF 45%, 12/12    Past Surgical History  Procedure Laterality Date  . Abdominal hysterectomy    . Appendectomy    . Eye surgery    . Tonsillectomy    . Breast surgery      Family History  Problem Relation Age of Onset  . Stroke Mother     Social History:  reports that she quit smoking about 52 years ago. She has never used smokeless tobacco. She reports that she does not drink alcohol or use illicit drugs.  Review of Systems:   Renal insufficiency: She  had been followed by a nephrologist for renal insufficiency previously and not clear the etiology of this. Her creatinine was significantly higher at 1.6  previously  and her Maxzide was stopped because of low normal blood pressure. Creatinine is slightly  higher at 1.43 recently  Hypokalemia: Her potassium was previously high, now 4.5  HYPERTENSION:  blood pressure is controlled with atenolol which she has taken for years and recently dose was reduced to 25 mg  by cardiologist  Cardiac history: She may have had heart failure during a hospitalization for pneumonia and ejection fraction previously was 45%  HYPERLIPIDEMIA: The lipid abnormality consists of elevated LDL treated with Lipitor. Has high triglycerides of 222  and LDL below 100 at 74  Lab Results  Component Value Date   CHOL 167 12/15/2013   HDL 39.80 12/15/2013   LDLCALC 89 12/15/2013   TRIG 189.0* 12/15/2013   CHOLHDL 4 12/15/2013     Feet numb, has parasthesiae, gabapentin not tolerated because of  ? Palpitations; better with Lyrica hs  Currently taking only 50 mg at bedtime and gets relief.   Also she has mild hypothyroidism treated with 50 mcg of levothyroxine with adequate control, TSH  normal recently  Lab Results  Component Value Date   TSH 1.24 03/10/2014   Diabetic foot exam done in 3/15  Eye exam 7/15     Examination:   BP 124/62  Pulse 82  Temp(Src) 98.3 F (36.8 C)  Resp 16  Wt 176 lb 9.6 oz (80.105 kg)  SpO2 97%  Body mass index is 29.39 kg/(m^2).    No ankle edema   ASSESSMENT/ PLAN:   Diabetes type 2   The patient's blood sugars are  overall excellent and she is requiring less insulin overall She is fairly compliant with checking her blood sugars and also insulin She does have occasional high readings after supper with eating larger amounts of carbohydrate or sweets  Insulin changes: She will reduce her Levemir by 2 units again May take extra 2 units of NovoLog when eating out or eating something sweet  Renal insufficiency: Creatinine slightly higher, likely to be from nephrosclerosis  Gracia Saggese 06/16/2014, 3:48 PM

## 2014-07-07 DIAGNOSIS — F325 Major depressive disorder, single episode, in full remission: Secondary | ICD-10-CM | POA: Diagnosis not present

## 2014-07-07 DIAGNOSIS — J069 Acute upper respiratory infection, unspecified: Secondary | ICD-10-CM | POA: Diagnosis not present

## 2014-07-21 ENCOUNTER — Other Ambulatory Visit: Payer: Self-pay | Admitting: *Deleted

## 2014-07-21 MED ORDER — LIRAGLUTIDE 18 MG/3ML ~~LOC~~ SOPN: 1.2000 mg | PEN_INJECTOR | Freq: Every day | SUBCUTANEOUS | Status: DC

## 2014-07-28 ENCOUNTER — Other Ambulatory Visit: Payer: Self-pay | Admitting: *Deleted

## 2014-07-28 ENCOUNTER — Telehealth: Payer: Self-pay

## 2014-07-28 NOTE — Telephone Encounter (Signed)
[name] Young called requesting that the pt's victoza be sent [facility] for [facility] 90 supply. The 30 day supply that was sent recently would cost the pt 63$.   [name] (pt's granddaughter would like for you to call once completed)  Thanks!

## 2014-07-28 NOTE — Telephone Encounter (Signed)
rx was sent in for a 90 day supply, 2 pens per month, 6 pens for 3 months, 6 pens were sent in to local pharmacy

## 2014-08-01 ENCOUNTER — Telehealth: Payer: Self-pay | Admitting: Endocrinology

## 2014-08-01 ENCOUNTER — Other Ambulatory Visit: Payer: Self-pay | Admitting: *Deleted

## 2014-08-01 MED ORDER — GLUCOSE BLOOD VI STRP: ORAL_STRIP | Status: DC

## 2014-08-01 NOTE — Telephone Encounter (Signed)
Patient would like 90 day supply for test strips  Meter: One touch ultra Patient test 4-5 times daily  [facility]    Thank you

## 2014-08-03 ENCOUNTER — Other Ambulatory Visit: Payer: Self-pay

## 2014-08-03 MED ORDER — ATENOLOL 25 MG PO TABS: 25.0000 mg | ORAL_TABLET | Freq: Every day | ORAL | Status: DC

## 2014-09-13 ENCOUNTER — Other Ambulatory Visit (INDEPENDENT_AMBULATORY_CARE_PROVIDER_SITE_OTHER): Payer: Medicare Other

## 2014-09-13 DIAGNOSIS — E039 Hypothyroidism, unspecified: Secondary | ICD-10-CM

## 2014-09-13 DIAGNOSIS — IMO0002 Reserved for concepts with insufficient information to code with codable children: Secondary | ICD-10-CM

## 2014-09-13 DIAGNOSIS — E785 Hyperlipidemia, unspecified: Secondary | ICD-10-CM

## 2014-09-13 DIAGNOSIS — E1165 Type 2 diabetes mellitus with hyperglycemia: Principal | ICD-10-CM

## 2014-09-13 DIAGNOSIS — E1149 Type 2 diabetes mellitus with other diabetic neurological complication: Secondary | ICD-10-CM

## 2014-09-13 LAB — COMPREHENSIVE METABOLIC PANEL
ALT: 17 U/L (ref 0–35)
AST: 17 U/L (ref 0–37)
Albumin: 4 g/dL (ref 3.5–5.2)
Alkaline Phosphatase: 59 U/L (ref 39–117)
BILIRUBIN TOTAL: 0.4 mg/dL (ref 0.2–1.2)
BUN: 26 mg/dL — AB (ref 6–23)
CALCIUM: 9.2 mg/dL (ref 8.4–10.5)
CO2: 19 meq/L (ref 19–32)
CREATININE: 1.8 mg/dL — AB (ref 0.4–1.2)
Chloride: 105 mEq/L (ref 96–112)
GFR: 29.4 mL/min — ABNORMAL LOW (ref >60.00)
GLUCOSE: 130 mg/dL — AB (ref 70–99)
Potassium: 4.6 mEq/L (ref 3.5–5.1)
Sodium: 140 mEq/L (ref 135–145)
Total Protein: 7.2 g/dL (ref 6.0–8.3)

## 2014-09-13 LAB — LDL CHOLESTEROL, DIRECT: LDL DIRECT: 100.8 mg/dL

## 2014-09-13 LAB — TSH: TSH: 4.91 u[IU]/mL — AB (ref 0.35–4.50)

## 2014-09-13 LAB — HEMOGLOBIN A1C: Hgb A1c MFr Bld: 5.9 % (ref 4.6–6.5)

## 2014-09-15 ENCOUNTER — Ambulatory Visit: Payer: Medicare Other | Admitting: Endocrinology

## 2014-09-15 DIAGNOSIS — F324 Major depressive disorder, single episode, in partial remission: Secondary | ICD-10-CM | POA: Diagnosis not present

## 2014-09-15 DIAGNOSIS — Z0000 Encounter for general adult medical examination without abnormal findings: Secondary | ICD-10-CM | POA: Diagnosis not present

## 2014-09-15 DIAGNOSIS — F419 Anxiety disorder, unspecified: Secondary | ICD-10-CM | POA: Diagnosis not present

## 2014-09-15 DIAGNOSIS — N184 Chronic kidney disease, stage 4 (severe): Secondary | ICD-10-CM | POA: Diagnosis not present

## 2014-09-15 DIAGNOSIS — E785 Hyperlipidemia, unspecified: Secondary | ICD-10-CM | POA: Diagnosis not present

## 2014-09-15 DIAGNOSIS — K279 Peptic ulcer, site unspecified, unspecified as acute or chronic, without hemorrhage or perforation: Secondary | ICD-10-CM | POA: Diagnosis not present

## 2014-09-15 DIAGNOSIS — D500 Iron deficiency anemia secondary to blood loss (chronic): Secondary | ICD-10-CM | POA: Diagnosis not present

## 2014-09-15 DIAGNOSIS — N390 Urinary tract infection, site not specified: Secondary | ICD-10-CM | POA: Diagnosis not present

## 2014-09-15 DIAGNOSIS — I10 Essential (primary) hypertension: Secondary | ICD-10-CM | POA: Diagnosis not present

## 2014-09-15 DIAGNOSIS — Z23 Encounter for immunization: Secondary | ICD-10-CM | POA: Diagnosis not present

## 2014-09-16 ENCOUNTER — Encounter: Payer: Self-pay | Admitting: Endocrinology

## 2014-09-16 ENCOUNTER — Ambulatory Visit (INDEPENDENT_AMBULATORY_CARE_PROVIDER_SITE_OTHER): Payer: Medicare Other | Admitting: Endocrinology

## 2014-09-16 VITALS — BP 124/68 | HR 96 | Temp 98.3°F | Resp 14 | Ht 65.0 in | Wt 175.2 lb

## 2014-09-16 DIAGNOSIS — E1142 Type 2 diabetes mellitus with diabetic polyneuropathy: Secondary | ICD-10-CM | POA: Diagnosis not present

## 2014-09-16 DIAGNOSIS — E039 Hypothyroidism, unspecified: Secondary | ICD-10-CM

## 2014-09-16 DIAGNOSIS — G629 Polyneuropathy, unspecified: Secondary | ICD-10-CM

## 2014-09-16 DIAGNOSIS — E038 Other specified hypothyroidism: Secondary | ICD-10-CM

## 2014-09-16 DIAGNOSIS — E063 Autoimmune thyroiditis: Secondary | ICD-10-CM

## 2014-09-16 DIAGNOSIS — N289 Disorder of kidney and ureter, unspecified: Secondary | ICD-10-CM | POA: Diagnosis not present

## 2014-09-16 DIAGNOSIS — E119 Type 2 diabetes mellitus without complications: Secondary | ICD-10-CM

## 2014-09-16 NOTE — Progress Notes (Signed)
Patient ID: Melissa [name], female   DOB: [birth year], 80 y.o.   MRN: [id number]   Reason for Appointment: Diabetes follow-up   History of Present Illness   Diagnosis: Type 2 DIABETES MELITUS     The patient has been on a multidrug regimen and previously had poor control with A1c as high as 9.8% in 2013 Her blood sugars had improved with adding Victoza as well as mealtime insulin To her regimen of Levemir Her blood sugars have been fairly good with adding Victoza and has been able to maintain her weight better also She has been able to keep up with her multi-injection regimen without problems and gets help from family member  Recent history:   She has been requiring less insulin over the last few months and is only taking 4 units of NovoLog with meals Her Levemir also has been reduced gradually, now down to 26 units in the evenings since 9/15 Blood sugars are again fairly well controlled overall except for a few high readings at night after supper but inconsistently Although her A1c is below 6% her home average blood sugar is about the same and A1c may be affected by her anemia and renal dysfunction She takes 1.2 mg of Victoza at bedtime and is compliant with this. Fasting readings are mostly near normal as before and only occasionally below 90, usually asymptomatic with this She does not adjust her suppertime insulin based on meal size or amount of carbohydrates  Oral hypoglycemic drugs: None        Side effects from medications: None Insulin regimen:4 units AC tid NOVOLOG, LEVEMIR 26 hs   Proper timing of medications in relation to meals: Yes.          Monitors blood glucose: Once a day.    Glucometer: One Touch.          Blood Glucose readings from meter download:   PRE-MEAL Breakfast Lunch Dinner  PCS/hs Overall  Glucose range: 77-126  125-171   112-144   110-189    Mean/median:  105     147 123   Hypoglycemia: None recently        Meals: 3 meals per day.  she may have a protein  in the form of an egg or peanut butter at breakfast but sometimes only eating a muffin, eating supper at 6 PM    Physical activity: exercise: not able to walk much because of balance or dypnea           Dietician visit: Most recent: Unknown            Wt Readings from Last 3 Encounters:  09/16/14 175 lb 3.2 oz (79.47 kg)  06/16/14 176 lb 9.6 oz (80.105 kg)  04/19/14 172 lb (78.019 kg)   Lab Results  Component Value Date   HGBA1C 5.9 09/13/2014   HGBA1C 6.4 03/10/2014   HGBA1C 6.5 12/15/2013   Lab Results  Component Value Date   MICROALBUR 0.1 12/15/2013   LDLCALC 89 12/15/2013   CREATININE 1.8* 09/13/2014        Medication List       This list is accurate as of: 09/16/14  3:13 PM.  Always use your most recent med list.               amitriptyline 25 MG tablet  Commonly known as:  ELAVIL  Take 25 mg by mouth 2 (two) times daily.     aspirin EC 81 MG tablet  Take 81 mg  by mouth daily.     atenolol 25 MG tablet  Commonly known as:  TENORMIN  Take 1 tablet (25 mg total) by mouth daily.     atorvastatin 20 MG tablet  Commonly known as:  LIPITOR  Take 20 mg by mouth daily.     BD PEN NEEDLE NANO U/F 32G X 4 MM Misc  Generic drug:  Insulin Pen Needle     cetirizine 10 MG tablet  Commonly known as:  ZYRTEC  Take 10 mg by mouth daily.     clotrimazole-betamethasone cream  Commonly known as:  LOTRISONE     esomeprazole 40 MG capsule  Commonly known as:  NEXIUM  Take 40 mg by mouth 2 (two) times daily.     FLUoxetine 20 MG capsule  Commonly known as:  PROZAC  Take 20 mg by mouth daily.     glucose blood test strip  Commonly known as:  ONE TOUCH ULTRA TEST  Use as instructed to check blood sugar 4-5 times a day dx code E11.49     LEVEMIR FLEXTOUCH 100 UNIT/ML Pen  Generic drug:  Insulin Detemir  INJECT 28 UNITS            SUBCUTANEOUSLY AT BEDTIME     levothyroxine 50 MCG tablet  Commonly known as:  SYNTHROID, LEVOTHROID  TAKE 1 TABLET BY MOUTH EVERY  DAY     Liraglutide 18 MG/3ML Sopn  Commonly known as:  VICTOZA  Inject 1.2 mg into the skin daily.     NOVOLOG [place]  Inject 4 Units into the skin 3 (three) times daily.     polyethylene glycol powder powder  Commonly known as:  GLYCOLAX/MIRALAX  Take 17 g by mouth daily.     pregabalin 50 MG capsule  Commonly known as:  LYRICA  Take up to 3 capsules daily as needed     sulfamethoxazole-trimethoprim 800-160 MG per tablet  Commonly known as:  BACTRIM DS     Vitamin D3 1000 UNITS Caps  Take 1 capsule by mouth daily.        Allergies: No Known Allergies  Past Medical History  Diagnosis Date  . Asthma   . Sleep apnea   . Angina   . Hypertension   . Blood transfusion   . GERD (gastroesophageal reflux disease)   . Headache(784.0)   . Arthritis   . Anxiety   . Pneumonia   . Diabetes mellitus   . Anemia   . Hiatal hernia   . Acute kidney injury 09/26/2011  . High cholesterol   . Unspecified hypothyroidism 06/15/2013  . Cardiomyopathy 09/02/2013    EF 45%, 12/12    Past Surgical History  Procedure Laterality Date  . Abdominal hysterectomy    . Appendectomy    . Eye surgery    . Tonsillectomy    . Breast surgery      Family History  Problem Relation Age of Onset  . Stroke Mother     Social History:  reports that she quit smoking about 53 years ago. She has never used smokeless tobacco. She reports that she does not drink alcohol or use illicit drugs.  Review of Systems:   Renal insufficiency: She  had been followed by a nephrologist for renal insufficiency previously and has not followed up recently Her creatinine was significantly higher at 1.6  previously  when her Maxzide was stopped because of low normal blood pressure. Creatinine is slightly higher.  She took a few Advil about 2  weeks ago but no more than 4-6 total  Lab Results  Component Value Date   CREATININE 1.8* 09/13/2014   CREATININE 1.3* 03/10/2014   CREATININE 1.4* 12/15/2013     Hypokalemia: Her potassium was previously high   HYPERTENSION:  blood pressure is very mild and controlled with atenolol which she has taken for years, followed by cardiologist and is taking only 25 mg   Cardiac history: She may have had heart failure during a hospitalization for pneumonia and ejection fraction previously was 45%.  Not on diuretics  HYPERLIPIDEMIA: The lipid abnormality consists of elevated LDL treated with Lipitor. Has had high triglycerides   Lab Results  Component Value Date   CHOL 167 12/15/2013   HDL 39.80 12/15/2013   LDLCALC 89 12/15/2013   LDLDIRECT 100.8 09/13/2014   TRIG 189.0* 12/15/2013   CHOLHDL 4 12/15/2013    Has had numbness in her feet and also has parasthesiae, gabapentin not tolerated because of  ? Palpitations; symptoms are better with Lyrica hs  Currently taking only 50 mg at bedtime  She is asking about a crawling sensation on her back sometimes at night  Also she has mild hypothyroidism since 5/14 treated with 50 mcg of levothyroxine with adequate control, TSH as follows: Her daughter thinks that she had better energy with starting thyroid supplementation even though her baseline TSH was 5.3.  She does not complain of fatigue now  Lab Results  Component Value Date   FREET4 0.86 03/10/2014   FREET4 0.95 10/18/2013   TSH 4.91* 09/13/2014   TSH 1.24 03/10/2014   TSH 1.29 10/18/2013    Diabetic foot exam done in 3/15  Eye exam 7/15  Preventive care: She had Prevnar by her PCP yesterday      Examination:   BP 124/68 mmHg  Pulse 96  Temp(Src) 98.3 F (36.8 C)  Resp 14  Ht 5\' 5"  (1.651 m)  Wt 175 lb 3.2 oz (79.47 kg)  BMI 29.15 kg/m2  SpO2 96%  Body mass index is 29.15 kg/(m^2).    No ankle edema  ASSESSMENT/ PLAN:   Diabetes type 2   The patient's blood sugars are  Overall fairly well controlled considering her age and duration of diabetes She  is requiring less insulin overall In 2015  She is fairly compliant with  checking her blood sugars and also insulin She does have occasional high readings after supper with eating larger amounts of carbohydrate or sweets Higher A1c is excellent although may may be lower because of  her diagnosis of anemia and renal insufficiency She can probably check her fasting blood sugar less often  Insulin changes:  None  Renal insufficiency: Creatinine significantly higher at 1.8 compared to 1.3 Maybe possibly from nephrosclerosis and other unknown factors including medications Would like to get her evaluated by nephrologist again and she will make an appointment  Paresthesiae on her back: Not clear of the cause, likely to be neuropathy.  May consider increasing Lyrica  Hypothyroidism: Although her TSH is only minimally increased and her hypothyroidism is mild would like to see if she will subjectively feel better with increasing her dose by half tablet every other day   Hypertension: Well controlled  HYPERLIPIDEMIA: Her direct LDL is just above 100 and may consider increasing her Lipitor to 40 mg; however she has not had any known CAD  Melissa [name] 09/16/2014, 3:13 PM

## 2014-09-16 NOTE — Patient Instructions (Signed)
Thyroid: 1 alt with 1 1/2

## 2014-09-28 DIAGNOSIS — R062 Wheezing: Secondary | ICD-10-CM | POA: Diagnosis not present

## 2014-09-28 DIAGNOSIS — N390 Urinary tract infection, site not specified: Secondary | ICD-10-CM | POA: Diagnosis not present

## 2014-09-28 DIAGNOSIS — J029 Acute pharyngitis, unspecified: Secondary | ICD-10-CM | POA: Diagnosis not present

## 2014-09-28 DIAGNOSIS — B370 Candidal stomatitis: Secondary | ICD-10-CM | POA: Diagnosis not present

## 2014-10-31 ENCOUNTER — Other Ambulatory Visit: Payer: Self-pay | Admitting: *Deleted

## 2014-10-31 DIAGNOSIS — M5410 Radiculopathy, site unspecified: Principal | ICD-10-CM

## 2014-10-31 DIAGNOSIS — E1149 Type 2 diabetes mellitus with other diabetic neurological complication: Secondary | ICD-10-CM

## 2014-10-31 MED ORDER — PREGABALIN 50 MG PO CAPS: ORAL_CAPSULE | ORAL | Status: DC

## 2014-12-24 ENCOUNTER — Emergency Department (HOSPITAL_COMMUNITY): Payer: Medicare Other

## 2014-12-24 ENCOUNTER — Encounter (HOSPITAL_COMMUNITY): Payer: Self-pay | Admitting: Emergency Medicine

## 2014-12-24 ENCOUNTER — Emergency Department (HOSPITAL_COMMUNITY)
Admission: EM | Admit: 2014-12-24 | Discharge: 2014-12-24 | Disposition: A | Discharge status: Home / Self Care | Payer: Medicare Other | Attending: Emergency Medicine | Admitting: Emergency Medicine

## 2014-12-24 DIAGNOSIS — E039 Hypothyroidism, unspecified: Secondary | ICD-10-CM | POA: Insufficient documentation

## 2014-12-24 DIAGNOSIS — Z8742 Personal history of other diseases of the female genital tract: Secondary | ICD-10-CM | POA: Insufficient documentation

## 2014-12-24 DIAGNOSIS — J45909 Unspecified asthma, uncomplicated: Secondary | ICD-10-CM | POA: Insufficient documentation

## 2014-12-24 DIAGNOSIS — M1990 Unspecified osteoarthritis, unspecified site: Secondary | ICD-10-CM | POA: Insufficient documentation

## 2014-12-24 DIAGNOSIS — Z7982 Long term (current) use of aspirin: Secondary | ICD-10-CM | POA: Diagnosis not present

## 2014-12-24 DIAGNOSIS — Z792 Long term (current) use of antibiotics: Secondary | ICD-10-CM | POA: Diagnosis not present

## 2014-12-24 DIAGNOSIS — Z794 Long term (current) use of insulin: Secondary | ICD-10-CM | POA: Diagnosis not present

## 2014-12-24 DIAGNOSIS — Z8701 Personal history of pneumonia (recurrent): Secondary | ICD-10-CM | POA: Insufficient documentation

## 2014-12-24 DIAGNOSIS — F419 Anxiety disorder, unspecified: Secondary | ICD-10-CM | POA: Diagnosis not present

## 2014-12-24 DIAGNOSIS — I10 Essential (primary) hypertension: Secondary | ICD-10-CM | POA: Insufficient documentation

## 2014-12-24 DIAGNOSIS — R4182 Altered mental status, unspecified: Secondary | ICD-10-CM | POA: Diagnosis not present

## 2014-12-24 DIAGNOSIS — I209 Angina pectoris, unspecified: Secondary | ICD-10-CM | POA: Diagnosis not present

## 2014-12-24 DIAGNOSIS — K219 Gastro-esophageal reflux disease without esophagitis: Secondary | ICD-10-CM | POA: Insufficient documentation

## 2014-12-24 DIAGNOSIS — Z8669 Personal history of other diseases of the nervous system and sense organs: Secondary | ICD-10-CM | POA: Diagnosis not present

## 2014-12-24 DIAGNOSIS — E780 Pure hypercholesterolemia: Secondary | ICD-10-CM | POA: Insufficient documentation

## 2014-12-24 DIAGNOSIS — Z87891 Personal history of nicotine dependence: Secondary | ICD-10-CM | POA: Diagnosis not present

## 2014-12-24 DIAGNOSIS — R404 Transient alteration of awareness: Secondary | ICD-10-CM | POA: Diagnosis not present

## 2014-12-24 DIAGNOSIS — D649 Anemia, unspecified: Secondary | ICD-10-CM | POA: Diagnosis not present

## 2014-12-24 DIAGNOSIS — R55 Syncope and collapse: Secondary | ICD-10-CM | POA: Diagnosis not present

## 2014-12-24 DIAGNOSIS — Z79899 Other long term (current) drug therapy: Secondary | ICD-10-CM | POA: Diagnosis not present

## 2014-12-24 DIAGNOSIS — E119 Type 2 diabetes mellitus without complications: Secondary | ICD-10-CM | POA: Diagnosis not present

## 2014-12-24 LAB — I-STAT TROPONIN, ED: TROPONIN I, POC: 0 ng/mL (ref 0.00–0.08)

## 2014-12-24 LAB — CBG MONITORING, ED: Glucose-Capillary: 93 mg/dL (ref 70–99)

## 2014-12-24 LAB — URINALYSIS, ROUTINE W REFLEX MICROSCOPIC
Bilirubin Urine: NEGATIVE
Glucose, UA: NEGATIVE mg/dL
Hgb urine dipstick: NEGATIVE
KETONES UR: NEGATIVE mg/dL
Leukocytes, UA: NEGATIVE
Nitrite: NEGATIVE
Protein, ur: NEGATIVE mg/dL
Specific Gravity, Urine: 1.012 (ref 1.005–1.030)
Urobilinogen, UA: 0.2 mg/dL (ref 0.0–1.0)
pH: 5.5 (ref 5.0–8.0)

## 2014-12-24 LAB — CBC WITH DIFFERENTIAL/PLATELET
BASOS ABS: 0.1 10*3/uL (ref 0.0–0.1)
Basophils Relative: 1 % (ref 0–1)
Eosinophils Absolute: 0.2 10*3/uL (ref 0.0–0.7)
Eosinophils Relative: 3 % (ref 0–5)
HCT: 42.6 % (ref 36.0–46.0)
Hemoglobin: 13.8 g/dL (ref 12.0–15.0)
LYMPHS ABS: 2 10*3/uL (ref 0.7–4.0)
LYMPHS PCT: 23 % (ref 12–46)
MCH: 27.8 pg (ref 26.0–34.0)
MCHC: 32.4 g/dL (ref 30.0–36.0)
MCV: 85.7 fL (ref 78.0–100.0)
MONO ABS: 0.7 10*3/uL (ref 0.1–1.0)
Monocytes Relative: 9 % (ref 3–12)
NEUTROS ABS: 5.6 10*3/uL (ref 1.7–7.7)
Neutrophils Relative %: 64 % (ref 43–77)
PLATELETS: 223 10*3/uL (ref 150–400)
RBC: 4.97 MIL/uL (ref 3.87–5.11)
RDW: 13.2 % (ref 11.5–15.5)
WBC: 8.6 10*3/uL (ref 4.0–10.5)

## 2014-12-24 LAB — BASIC METABOLIC PANEL
Anion gap: 7 (ref 5–15)
BUN: 14 mg/dL (ref 6–23)
CO2: 28 mmol/L (ref 19–32)
Calcium: 9.3 mg/dL (ref 8.4–10.5)
Chloride: 103 mmol/L (ref 96–112)
Creatinine, Ser: 1.27 mg/dL — ABNORMAL HIGH (ref 0.50–1.10)
GFR calc non Af Amer: 38 mL/min — ABNORMAL LOW (ref >90)
GFR, EST AFRICAN AMERICAN: 44 mL/min — AB (ref >90)
Glucose, Bld: 84 mg/dL (ref 70–99)
POTASSIUM: 4.6 mmol/L (ref 3.5–5.1)
Sodium: 138 mmol/L (ref 135–145)

## 2014-12-24 MED ORDER — SODIUM CHLORIDE 0.9 % IV SOLN
Freq: Once | INTRAVENOUS | Status: AC
  Administered 2014-12-24: 15:00:00 via INTRAVENOUS

## 2014-12-24 NOTE — ED Provider Notes (Signed)
Ct head negative Pt ambulatory She feels improved She would like to be discharged No medication adjustments required at this time   Ripley Fraise, MD 12/24/14 1843

## 2014-12-24 NOTE — ED Notes (Signed)
EMS - Patient had a near syncopal episode at 43.  Felt like she could not talk, was awake and could hear but could not talk.

## 2014-12-24 NOTE — ED Provider Notes (Signed)
81 year old female, presents after having an abnormal mental status episode at home, the patient states she felt like she couldn't talk, she was awake, the family member state that she stares off, she has no other complaints at this time. She is on chronic antibiotic therapy for chronic UTI, has been on this for several months. She denies nausea vomiting fevers chills coughing shortness breath back pain chest pain or swelling of the lower extremities and on exam the patient has clear heart sounds, clear lung sounds, mild tachycardia, no peripheral edema and a normal neurologic exam except for her right pupil which is minimally reactive. We'll obtain urinalysis, basic labs, EKG, CT scan of the head to evaluate for other sources of the patient's pupillary abnormality, anticipate discharge if normal.  Medical screening examination/treatment/procedure(s) were conducted as a shared visit with non-physician practitioner(s) and myself.  I personally evaluated the patient during the encounter.  Clinical Impression:   Final diagnoses:  Near syncope         Noemi Chapel, MD 12/24/14 1600

## 2014-12-24 NOTE — ED Notes (Signed)
Pt family states "orthostatics were completed by another tech 15 minutes ago."
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
## 2014-12-24 NOTE — ED Notes (Signed)
Pt ambulated well 

## 2014-12-24 NOTE — Discharge Instructions (Signed)
Near-Syncope Near-syncope (commonly known as near fainting) is sudden weakness, dizziness, or feeling like you might pass out. During an episode of near-syncope, you may also develop pale skin, have tunnel vision, or feel sick to your stomach (nauseous). Near-syncope may occur when getting up after sitting or while standing for a long time. It is caused by a sudden decrease in blood flow to the brain. This decrease can result from various causes or triggers, most of which are not serious. However, because near-syncope can sometimes be a sign of something serious, a medical evaluation is required. The specific cause is often not determined. HOME CARE INSTRUCTIONS  Monitor your condition for any changes. The following actions may help to alleviate any discomfort you are experiencing:  Have someone stay with you until you feel stable.  Lie down right away and prop your feet up if you start feeling like you might faint. Breathe deeply and steadily. Wait until all the symptoms have passed. Most of these episodes last only a few minutes. You may feel tired for several hours.   Drink enough fluids to keep your urine clear or pale yellow.   If you are taking blood pressure or heart medicine, get up slowly when seated or lying down. Take several minutes to sit and then stand. This can reduce dizziness.  Follow up with your health care provider as directed. SEEK IMMEDIATE MEDICAL CARE IF:   You have a severe headache.   You have unusual pain in the chest, abdomen, or back.   You are bleeding from the mouth or rectum, or you have black or tarry stool.   You have an irregular or very fast heartbeat.   You have repeated fainting or have seizure-like jerking during an episode.   You faint when sitting or lying down.   You have confusion.   You have difficulty walking.   You have severe weakness.   You have vision problems.  MAKE SURE YOU:   Understand these instructions.  Will  watch your condition.  Will get help right away if you are not doing well or get worse. Document Released: 09/30/2005 Document Revised: 10/05/2013 Document Reviewed: 03/05/2013 [facility] Patient Information 2015 [place], [place]. This information is not intended to replace advice given to you by your health care provider. Make sure you discuss any questions you have with your health care provider.    Driving and Equipment Restrictions Some medical problems make it dangerous to drive, ride a bike, or use machines. Some of these problems are:  A hard blow to the head (concussion).  Passing out (fainting).  Twitching and shaking (seizures).  Low blood sugar.  Taking medicine to help you relax (sedatives).  Taking pain medicines.  Wearing an eye patch.  Wearing splints. This can make it hard to use parts of your body that you need to drive safely. HOME CARE   Do not drive until your doctor says it is okay.  Do not use machines until your doctor says it is okay. You may need a form signed by your doctor (medical release) before you can drive again. You may also need this form before you do other tasks where you need to be fully alert. MAKE SURE YOU:  Understand these instructions.  Will watch your condition.  Will get help right away if you are not doing well or get worse. Document Released: 11/07/2004 Document Revised: 12/23/2011 Document Reviewed: 02/07/2010 [facility] Patient Information 2015 [place], [place]. This information is not intended to replace advice  given to you by your health care provider. Make sure you discuss any questions you have with your health care provider.

## 2014-12-26 ENCOUNTER — Telehealth: Payer: Self-pay | Admitting: Cardiology

## 2014-12-26 NOTE — Telephone Encounter (Signed)
Will review with Dr [name] - pt is not due back until 04/2015

## 2014-12-26 NOTE — Telephone Encounter (Signed)
New Msg       Pt granddaughter [name] calling,   Does pt need to be seen by [name] before she is able to drive again?   Please return call.

## 2014-12-27 NOTE — Telephone Encounter (Signed)
I would advise against driving at this point given her recent constellation of symptoms.  [name], MD

## 2014-12-27 NOTE — Telephone Encounter (Signed)
Left message to call back to discuss reccomendations

## 2014-12-27 NOTE — Telephone Encounter (Signed)
Spoke with grand-daughter.  She understands Dr [name]' recommendation but would like for the pt to be seen for evaluation RE: driving in the future.  She was scheduled to be seen by Dr [name].  Grand-daughter feels as though the family shouldn't be the ones to tell the pt not to drive and that it would come better from Dr [name].

## 2015-01-11 ENCOUNTER — Other Ambulatory Visit: Payer: Self-pay | Admitting: *Deleted

## 2015-01-11 ENCOUNTER — Encounter: Payer: Self-pay | Admitting: Cardiology

## 2015-01-11 ENCOUNTER — Ambulatory Visit (INDEPENDENT_AMBULATORY_CARE_PROVIDER_SITE_OTHER): Payer: Medicare Other | Admitting: Cardiology

## 2015-01-11 VITALS — BP 122/70 | HR 99 | Ht 65.0 in | Wt 174.0 lb

## 2015-01-11 DIAGNOSIS — I420 Dilated cardiomyopathy: Secondary | ICD-10-CM | POA: Diagnosis not present

## 2015-01-11 DIAGNOSIS — I429 Cardiomyopathy, unspecified: Secondary | ICD-10-CM

## 2015-01-11 DIAGNOSIS — I519 Heart disease, unspecified: Secondary | ICD-10-CM | POA: Diagnosis not present

## 2015-01-11 DIAGNOSIS — I10 Essential (primary) hypertension: Secondary | ICD-10-CM | POA: Diagnosis not present

## 2015-01-11 DIAGNOSIS — R55 Syncope and collapse: Secondary | ICD-10-CM

## 2015-01-11 NOTE — Patient Instructions (Signed)
Your physician has recommended you make the following change in your medication:  1) STOP taking Atenolol  Your physician has recommended that you wear an event monitor. Event monitors are medical devices that record the heart's electrical activity. Doctors most often [place] these monitors to diagnose arrhythmias. Arrhythmias are problems with the speed or rhythm of the heartbeat. The monitor is a small, portable device. You can wear one while you do your normal daily activities. This is usually used to diagnose what is causing palpitations/syncope (passing out).  Your physician recommends that you schedule a follow-up appointment in: 6 weeks with [name].

## 2015-01-11 NOTE — Progress Notes (Signed)
[place]. [address], [place], [place]  [address] Phone: [phone] Fax:  [phone]  Date:  01/11/2015   ID:  Melissa [name], DOB 1927/11/28, MRN [id number]  PCP:  [name], MD   History of Present Illness: Melissa [name] is a 81 y.o. female heart failure (since resolved). She has diabetes, hypertension, hyperlipidemia. Prior hospitalization in mid December 2012 secondary to altered mental status and increasing O2 demands. Her BNP was noted to be elevated at 3031.   Stood at desk at [employer], could not speak felt like she was loosing vision which occurred after she stood up from the chair.  Felt like could not speak. When EMS came, her blood pressure was low. Could not palpate. She was dehydrated. BP was orthostatic. CT scan and ECG OK. Felt fine since since then. Been off maxide since 2015. Now only on atenolol which I will stop. Since his episode she has been doing well.  Echocardiogram showed an EF of 45-50% mildly reduced. Chest x-ray showed no acute disease.   Stress test was performed on 11/08/11 and was low risk, no ischemia, normal EF actually.     Wt Readings from Last 3 Encounters:  01/11/15 174 lb (78.926 kg)  09/16/14 175 lb 3.2 oz (79.47 kg)  06/16/14 176 lb 9.6 oz (80.105 kg)     Past Medical History  Diagnosis Date  . Asthma   . Sleep apnea   . Angina   . Hypertension   . Blood transfusion   . GERD (gastroesophageal reflux disease)   . Headache(784.0)   . Arthritis   . Anxiety   . Pneumonia   . Diabetes mellitus   . Anemia   . Hiatal hernia   . Acute kidney injury 09/26/2011  . High cholesterol   . Unspecified hypothyroidism 06/15/2013  . Cardiomyopathy 09/02/2013    EF 45%, 12/12    Past Surgical History  Procedure Laterality Date  . Abdominal hysterectomy    . Appendectomy    . Eye surgery    . Tonsillectomy    . Breast surgery      Current Outpatient Prescriptions  Medication Sig Dispense Refill  . acetaminophen  (TYLENOL) 500 MG tablet Take 1,000 mg by mouth every 6 (six) hours as needed.    Marland Kitchen amitriptyline (ELAVIL) 25 MG tablet Take 25 mg by mouth 2 (two) times daily.     Marland Kitchen amoxicillin-clavulanate (AUGMENTIN) 250-125 MG per tablet Take 1 tablet by mouth daily.    Marland Kitchen aspirin EC 81 MG tablet Take 81 mg by mouth daily.      Marland Kitchen atenolol (TENORMIN) 25 MG tablet Take 1 tablet (25 mg total) by mouth daily. 90 tablet 3  . atorvastatin (LIPITOR) 20 MG tablet Take 20 mg by mouth daily.      . BD PEN NEEDLE NANO U/F 32G X 4 MM MISC     . Cholecalciferol (VITAMIN D3) 1000 UNITS CAPS Take 1 capsule by mouth daily.    Marland Kitchen esomeprazole (NEXIUM) 40 MG capsule Take 40 mg by mouth 2 (two) times daily.      Marland Kitchen FLUoxetine (PROZAC) 20 MG capsule Take 20 mg by mouth daily.     Marland Kitchen glucose blood (ONE TOUCH ULTRA TEST) test strip Use as instructed to check blood sugar 4-5 times a day dx code E11.49 450 each 1  . Insulin Aspart (NOVOLOG [place]) Inject 4 Units into the skin 3 (three) times daily.     Marland Kitchen  Insulin Detemir (LEVEMIR FLEXTOUCH) 100 UNIT/ML Pen INJECT 28 UNITS            SUBCUTANEOUSLY AT BEDTIME    . levalbuterol (XOPENEX) 0.63 MG/3ML nebulizer solution Take 0.63 mg by nebulization every 6 (six) hours as needed for wheezing or shortness of breath.   0  . levothyroxine (SYNTHROID, LEVOTHROID) 50 MCG tablet TAKE 1 TABLET BY MOUTH EVERY DAY 30 tablet 2  . Liraglutide (VICTOZA) 18 MG/3ML SOPN Inject 1.2 mg into the skin daily. 6 pen 5  . pregabalin (LYRICA) 50 MG capsule Take up to 3 capsules daily as needed (Patient taking differently: Take 150 mg by mouth 3 (three) times daily. Take up to 3 capsules daily as needed) 270 capsule 1  . senna (SENOKOT) 8.6 MG tablet Take 1 tablet by mouth as needed for constipation.     No current facility-administered medications for this visit.    Allergies:   No Known Allergies  Social History:  The patient  reports that she quit smoking about 53 years ago. She has never used smokeless tobacco.  She reports that she does not drink alcohol or use illicit drugs.   ROS:  Please see the history of present illness.   Denies any syncope, bleeding, orthopnea, PND, shortness of breath  PHYSICAL EXAM: VS:  BP 122/70 mmHg  Pulse 99  Ht 5\' 5"  (1.651 m)  Wt 174 lb (78.926 kg)  BMI 28.96 kg/m2 Well nourished, well developed, in no acute distress HEENT: normal Neck: no JVD Cardiac:  normal S1, S2; RRR; no murmur Lungs:  clear to auscultation bilaterally, no wheezing, rhonchi or rales Abd: soft, nontender, no hepatomegaly Ext: no edema today Skin: warm and dry Neuro: no focal abnormalities noted  EKG:  None today     ASSESSMENT AND PLAN:  1. Near syncope-sounds mostly orthostatic in origin. She still.and started to lose her vision, could not speak. She has had an extensive neurologic evaluation 2 years ago, unremarkable. I will place it that monitor to ensure that she does not have any adverse arrhythmias. I do not think it would be unreasonable for her to drive as she is not having the symptoms and a sitting position. If she does however she knows to stop driving. Stop her atenolol which may be causing drops in her blood pressure. Her daughter is in agreement with plan. 2. Cardiomyopathy-mildly reduced LV systolic dysfunction overall doing very well. No diuretic therapy currently because of relative hypotension. In fact, her last stress test showed a normal ejection fraction. 3. Hypertension-currently very well controlled. In fact, mildly low. Because of this, we will go ahead and stop her atenolol  4. Hyperlipidemia-continue with low-dose statin. LDL 89 on 12/15/13 5. Chronic kidney disease stage III-creatinine 1.3 at last check. Avoid NSAIDs 6. See her back in 6 weeks.Post monitor.   Signed, [name], MD [facility]  01/11/2015 10:16 AM

## 2015-01-12 ENCOUNTER — Other Ambulatory Visit (INDEPENDENT_AMBULATORY_CARE_PROVIDER_SITE_OTHER): Payer: Medicare Other

## 2015-01-12 DIAGNOSIS — E119 Type 2 diabetes mellitus without complications: Secondary | ICD-10-CM | POA: Diagnosis not present

## 2015-01-12 DIAGNOSIS — E039 Hypothyroidism, unspecified: Secondary | ICD-10-CM | POA: Diagnosis not present

## 2015-01-12 LAB — URINALYSIS, ROUTINE W REFLEX MICROSCOPIC
BILIRUBIN URINE: NEGATIVE
Ketones, ur: NEGATIVE
Nitrite: NEGATIVE
PH: 6 (ref 5.0–8.0)
Specific Gravity, Urine: 1.02 (ref 1.000–1.030)
TOTAL PROTEIN, URINE-UPE24: NEGATIVE
URINE GLUCOSE: NEGATIVE
Urobilinogen, UA: 0.2 (ref 0.0–1.0)

## 2015-01-12 LAB — BASIC METABOLIC PANEL
BUN: 23 mg/dL (ref 6–23)
CO2: 29 meq/L (ref 19–32)
CREATININE: 1.38 mg/dL — AB (ref 0.40–1.20)
Calcium: 9.3 mg/dL (ref 8.4–10.5)
Chloride: 100 mEq/L (ref 96–112)
GFR: 38.64 mL/min — AB (ref >60.00)
GLUCOSE: 117 mg/dL — AB (ref 70–99)
Potassium: 4 mEq/L (ref 3.5–5.1)
Sodium: 135 mEq/L (ref 135–145)

## 2015-01-12 LAB — TSH: TSH: 2.41 u[IU]/mL (ref 0.35–4.50)

## 2015-01-12 LAB — HEMOGLOBIN A1C: Hgb A1c MFr Bld: 7.1 % — ABNORMAL HIGH (ref 4.6–6.5)

## 2015-01-12 LAB — MICROALBUMIN / CREATININE URINE RATIO
Creatinine,U: 161.3 mg/dL
MICROALB/CREAT RATIO: 0.9 mg/g (ref 0.0–30.0)
Microalb, Ur: 1.4 mg/dL (ref 0.0–1.9)

## 2015-01-12 LAB — T4, FREE: FREE T4: 0.85 ng/dL (ref 0.60–1.60)

## 2015-01-16 ENCOUNTER — Ambulatory Visit (INDEPENDENT_AMBULATORY_CARE_PROVIDER_SITE_OTHER): Payer: Medicare Other | Admitting: Endocrinology

## 2015-01-16 ENCOUNTER — Encounter: Payer: Self-pay | Admitting: Endocrinology

## 2015-01-16 VITALS — BP 126/74 | HR 106 | Temp 98.0°F | Resp 14 | Ht 65.0 in | Wt 177.2 lb

## 2015-01-16 DIAGNOSIS — E1165 Type 2 diabetes mellitus with hyperglycemia: Secondary | ICD-10-CM

## 2015-01-16 DIAGNOSIS — E063 Autoimmune thyroiditis: Secondary | ICD-10-CM

## 2015-01-16 DIAGNOSIS — I10 Essential (primary) hypertension: Secondary | ICD-10-CM | POA: Diagnosis not present

## 2015-01-16 DIAGNOSIS — N289 Disorder of kidney and ureter, unspecified: Secondary | ICD-10-CM

## 2015-01-16 DIAGNOSIS — IMO0002 Reserved for concepts with insufficient information to code with codable children: Secondary | ICD-10-CM

## 2015-01-16 DIAGNOSIS — E038 Other specified hypothyroidism: Secondary | ICD-10-CM

## 2015-01-16 NOTE — Patient Instructions (Signed)
Novolog 6 units at supper instead of 4; May reduce it to 4 for small meals  Pl call if sugars are still over 200 at night or if going below 120

## 2015-01-16 NOTE — Progress Notes (Signed)
Patient ID: Melissa [name], female   DOB: 11-20-1927, 81 y.o.   MRN: [id number]   Reason for Appointment: Diabetes follow-up   History of Present Illness   Diagnosis: Type 2 DIABETES MELITUS     The patient has been on a multidrug regimen and previously had poor control with A1c as high as 9.8% in 2013 Her blood sugars had improved with adding Victoza as well as mealtime insulin To her regimen of Levemir Her blood sugars have been fairly good with adding Victoza and has been able to maintain her weight better also She has been able to keep up with her multi-injection regimen without problems and gets help from family member  Recent history:   Insulin regimen:4 units AC tid NOVOLOG, LEVEMIR 26 hs    She previously has been been requiring less insulin and her dosage is especially mealtime doses had been reduced However more recently her blood sugars appear to be somewhat higher overall but mostly after supper recently She may be getting larger portions as she has gained 3 pounds She is quite compliant with taking her mealtime doses before eating FASTING blood sugars are mostly in the low 100 range with current regimen of 26 Levemir at night Her Levemir has not been changed since 06/2014  Although her A1c previously had been below 6% it is now 7.1; this is now as expected for her readings She does not adjust her suppertime insulin based on meal size or amount of carbohydrates Does not have any significant increase in fasting blood sugars if she has a late night desert or large snack  Oral hypoglycemic drugs: None        Side effects from medications: None Proper timing of medications in relation to meals: Yes.          Monitors blood glucose: Once a day.    Glucometer: One Touch.          Blood Glucose readings from meter download:   PRE-MEAL Breakfast Lunch Dinner Bedtime Overall  Glucose range:  106-182   139-167      Mean  136      159   POST-MEAL PC Breakfast PC Lunch  PC Dinner  Glucose range:  116-191   128-162   107-259  Mean/median:    175   Hypoglycemia: None recentlyIncluding overnight         Meals:  She  may have a protein in the form of an egg or peanut butter at breakfast but sometimes only eating a muffin, eating supper at 5-6 PM     Physical activity: exercise: not able to walk much because of balance or dypnea           Dietician visit: Most recent: Unknown            Wt Readings from Last 3 Encounters:  01/16/15 177 lb 3.2 oz (80.377 kg)  01/11/15 174 lb (78.926 kg)  09/16/14 175 lb 3.2 oz (79.47 kg)   Lab Results  Component Value Date   HGBA1C 7.1* 01/12/2015   HGBA1C 5.9 09/13/2014   HGBA1C 6.4 03/10/2014   Lab Results  Component Value Date   MICROALBUR 1.4 01/12/2015   LDLCALC 89 12/15/2013   CREATININE 1.38* 01/12/2015    Lab on 01/12/2015  Component Date Value Ref Range Status  . Hgb A1c MFr Bld 01/12/2015 7.1* 4.6 - 6.5 % Final   Glycemic Control Guidelines for People with Diabetes:Non Diabetic:  <6%Goal of Therapy: <7%Additional Action Suggested:  >  8%   . Sodium 01/12/2015 135  135 - 145 mEq/L Final  . Potassium 01/12/2015 4.0  3.5 - 5.1 mEq/L Final  . Chloride 01/12/2015 100  96 - 112 mEq/L Final  . CO2 01/12/2015 29  19 - 32 mEq/L Final  . Glucose, Bld 01/12/2015 117* 70 - 99 mg/dL Final  . BUN 01/12/2015 23  6 - 23 mg/dL Final  . Creatinine, Ser 01/12/2015 1.38* 0.40 - 1.20 mg/dL Final  . Calcium 01/12/2015 9.3  8.4 - 10.5 mg/dL Final  . GFR 01/12/2015 38.64* >60.00 mL/min Final  . TSH 01/12/2015 2.41  0.35 - 4.50 uIU/mL Final  . Free T4 01/12/2015 0.85  0.60 - 1.60 ng/dL Final  . Microalb, Ur 01/12/2015 1.4  0.0 - 1.9 mg/dL Final  . Creatinine,U 01/12/2015 161.3   Final  . Microalb Creat Ratio 01/12/2015 0.9  0.0 - 30.0 mg/g Final  . Color, Urine 01/12/2015 YELLOW  Yellow;Lt. Yellow Final  . APPearance 01/12/2015 CLEAR  Clear Final  . Specific Gravity, Urine 01/12/2015 1.020  1.000-1.030 Final  . pH  01/12/2015 6.0  5.0 - 8.0 Final  . Total Protein, Urine 01/12/2015 NEGATIVE  Negative Final  . Urine Glucose 01/12/2015 NEGATIVE  Negative Final  . Ketones, ur 01/12/2015 NEGATIVE  Negative Final  . Bilirubin Urine 01/12/2015 NEGATIVE  Negative Final  . Hgb urine dipstick 01/12/2015 TRACE-LYSED* Negative Final  . Urobilinogen, UA 01/12/2015 0.2  0.0 - 1.0 Final  . Leukocytes, UA 01/12/2015 TRACE* Negative Final  . Nitrite 01/12/2015 NEGATIVE  Negative Final  . WBC, UA 01/12/2015 0-2/hpf  0-2/hpf Final  . RBC / HPF 01/12/2015 0-2/hpf  0-2/hpf Final  . Mucus, UA 01/12/2015 Presence of* None Final  . Squamous Epithelial / LPF 01/12/2015 Rare(0-4/hpf)  Rare(0-4/hpf) Final  . Renal Epithel, UA 01/12/2015 Rare(0-4/hpf)* None Final  . Granular Casts, UA 01/12/2015 Presence of* None Final  . Hyaline Casts, UA 01/12/2015 Presence of* None Final       Medication List       This list is accurate as of: 01/16/15  3:11 PM.  Always use your most recent med list.               acetaminophen 500 MG tablet  Commonly known as:  TYLENOL  Take 1,000 mg by mouth every 6 (six) hours as needed.     amitriptyline 25 MG tablet  Commonly known as:  ELAVIL  Take 25 mg by mouth 2 (two) times daily.     amoxicillin-clavulanate 250-125 MG per tablet  Commonly known as:  AUGMENTIN  Take 0.5 tablets by mouth daily.     aspirin EC 81 MG tablet  Take 81 mg by mouth daily.     atenolol 25 MG tablet  Commonly known as:  TENORMIN     atorvastatin 20 MG tablet  Commonly known as:  LIPITOR  Take 20 mg by mouth daily.     BD PEN NEEDLE NANO U/F 32G X 4 MM Misc  Generic drug:  Insulin Pen Needle     esomeprazole 40 MG capsule  Commonly known as:  NEXIUM  Take 40 mg by mouth 2 (two) times daily.     FLUoxetine 20 MG capsule  Commonly known as:  PROZAC  Take 20 mg by mouth daily.     glucose blood test strip  Commonly known as:  ONE TOUCH ULTRA TEST  Use as instructed to check blood sugar 4-5  times a day dx code E11.49  levalbuterol 0.63 MG/3ML nebulizer solution  Commonly known as:  XOPENEX  Take 0.63 mg by nebulization every 6 (six) hours as needed for wheezing or shortness of breath.     LEVEMIR FLEXTOUCH 100 UNIT/ML Pen  Generic drug:  Insulin Detemir  INJECT 28 UNITS            SUBCUTANEOUSLY AT BEDTIME     levothyroxine 50 MCG tablet  Commonly known as:  SYNTHROID, LEVOTHROID  TAKE 1 TABLET BY MOUTH EVERY DAY     Liraglutide 18 MG/3ML Sopn  Commonly known as:  VICTOZA  Inject 1.2 mg into the skin daily.     NOVOLOG [place]  Inject 4 Units into the skin 3 (three) times daily.     pregabalin 50 MG capsule  Commonly known as:  LYRICA  Take up to 3 capsules daily as needed     senna 8.6 MG tablet  Commonly known as:  SENOKOT  Take 1 tablet by mouth as needed for constipation.     Vitamin D3 1000 UNITS Caps  Take 1 capsule by mouth daily.        Allergies: No Known Allergies  Past Medical History  Diagnosis Date  . Asthma   . Sleep apnea   . Angina   . Hypertension   . Blood transfusion   . GERD (gastroesophageal reflux disease)   . Headache(784.0)   . Arthritis   . Anxiety   . Pneumonia   . Diabetes mellitus   . Anemia   . Hiatal hernia   . Acute kidney injury 09/26/2011  . High cholesterol   . Unspecified hypothyroidism 06/15/2013  . Cardiomyopathy 09/02/2013    EF 45%, 12/12    Past Surgical History  Procedure Laterality Date  . Abdominal hysterectomy    . Appendectomy    . Eye surgery    . Tonsillectomy    . Breast surgery      Family History  Problem Relation Age of Onset  . Stroke Mother     Social History:  reports that she quit smoking about 53 years ago. She has never used smokeless tobacco. She reports that she does not drink alcohol or use illicit drugs.  Review of Systems:   Renal insufficiency: She  had been followed by a nephrologist for renal insufficiency previously and has not followed up recently Her creatinine  was significantly higher at 1.8 on her last visit but is now improved, possibly from avoiding Advil Not followed by nephrologist now   Lab Results  Component Value Date   CREATININE 1.38* 01/12/2015   CREATININE 1.27* 12/24/2014   CREATININE 1.8* 09/13/2014    HYPERTENSION:  blood pressure is very mild and controlled with atenolol which she has taken for years, followed by cardiologist and is taking only 25 mg   Cardiac history: She may have had heart failure during a hospitalization for pneumonia and ejection fraction previously was 45%.  Not on diuretics  HYPERLIPIDEMIA: The lipid abnormality consists of elevated LDL treated with Lipitor. Has had high triglycerides  Last LDL about 100  Lab Results  Component Value Date   CHOL 167 12/15/2013   HDL 39.80 12/15/2013   LDLCALC 89 12/15/2013   LDLDIRECT 100.8 09/13/2014   TRIG 189.0* 12/15/2013   CHOLHDL 4 12/15/2013    Has had numbness in her feet and also has parasthesiae, gabapentin not tolerated because of  ? Palpitations Her symptoms are better with Lyrica 50 mg taken only at bedtime   Also she  has mild hypothyroidism since 5/14 treated with 50 mcg of levothyroxine with adequate control, TSH as follows: Her baseline TSH was 5.3.   Because of relatively higher TSH in 12/15 she is now taking 50 g alternating with 75 and her TSH is better. She does not complain of fatigue now  Lab Results  Component Value Date   FREET4 0.85 01/12/2015   FREET4 0.86 03/10/2014   FREET4 0.95 10/18/2013   TSH 2.41 01/12/2015   TSH 4.91* 09/13/2014   TSH 1.24 03/10/2014    Diabetic foot exam done in 4/16  Eye exam 7/15  Preventive care: She had Prevnar by her PCP yesterday      Examination:   BP 126/74 mmHg  Pulse 106  Temp(Src) 98 F (36.7 C)  Resp 14  Ht 5\' 5"  (1.651 m)  Wt 177 lb 3.2 oz (80.377 kg)  BMI 29.49 kg/m2  SpO2 96%  Body mass index is 29.49 kg/(m^2).   Diabetic foot exam shows normal monofilament sensation in  the toes and plantar surfaces, no skin lesions or ulcers on the feet and normal pedal pulses  No ankle edema  ASSESSMENT/ PLAN:   Diabetes type 2   The patient's blood sugars are still reasonably controlled although overall higher than her last visit and fluctuating somewhat more. As discussed in history of present illness most of her high readings are related to her evening meal even though she is not changing her diet significantly Sometimes will have desserts or high-fat snacks Her weight is stable However she is unable to exercise because of her disabilities She is fairly compliant with checking her blood sugars and also insulin  Insulin changes: Increase suppertime dose by 2 units and may adjust this based on meal size, discussed postprandial blood sugar targets and she may need to adjust the dose further No change in other insulin doses  Foot exam shows no significant neuropathy, discussed general principles of foot care and daily inspection  Renal insufficiency: Creatinine has improved, previously was 1.8 And is now 1.3 Abnormal functions are  possibly from nephrosclerosis and other unknown factors including medicationsPer She will try to avoid any nonsteroidal drugs  Hypothyroidism: Although her TSH was minimally increased on her last visit she is subjectively doing well with taking 50 g alternating with 75 TSH is back to normal  Hypertension: Well controlled And relatively mild   Patient Instructions  Novolog 6 units at supper instead of 4; May reduce it to 4 for small meals  Pl call if sugars are still over 200 at night or if going below 120     Counseling time over 50% of today's 25 minute visit  Melissa [name] 01/16/2015, 3:11 PM

## 2015-01-17 ENCOUNTER — Encounter (INDEPENDENT_AMBULATORY_CARE_PROVIDER_SITE_OTHER): Payer: Medicare Other

## 2015-01-17 ENCOUNTER — Encounter: Payer: Self-pay | Admitting: *Deleted

## 2015-01-17 DIAGNOSIS — R55 Syncope and collapse: Secondary | ICD-10-CM | POA: Diagnosis not present

## 2015-01-17 NOTE — Progress Notes (Signed)
Patient ID: Melissa [name], female   DOB: 01-Oct-1928, 81 y.o.   MRN: [id number] [employer] 30 day cardiac event monitor applied to patient.

## 2015-01-29 ENCOUNTER — Other Ambulatory Visit: Payer: Self-pay | Admitting: Endocrinology

## 2015-02-07 ENCOUNTER — Telehealth: Payer: Self-pay

## 2015-02-07 MED ORDER — APIXABAN 2.5 MG PO TABS: 2.5000 mg | ORAL_TABLET | Freq: Two times a day (BID) | ORAL | Status: DC

## 2015-02-07 NOTE — Telephone Encounter (Signed)
Pt contacted as received [employer] Critical alert from pt monitor.  Pt experienced Wide Complex A. Fib 2:08am; pt was resting but daughter [name] checked on her and stated pt was fine.   Pt strips reviewed and signed by DOD.  Pt not currently on anti-coagulation so started pt on low dose Eliquis 2.5 mg BID as due to pt age and renal function. Pt has schedule appt.with [name] on 5/23.  Pt instructed on new medication and reminded of next appointment.  Called pt back left message on VM to STOP taking ASA 81 as she is starting Eliquis. Pt told there are 2 weeks of Eliquis samples at front desk for her to pick, pt agreed.

## 2015-02-07 NOTE — Telephone Encounter (Signed)
Agree with plan. Thanks [name], MD

## 2015-02-09 ENCOUNTER — Telehealth: Payer: Self-pay | Admitting: Cardiology

## 2015-02-09 NOTE — Telephone Encounter (Signed)
New Message   Patients daughter states that heart monitor is ringing up emergency call and to call 911 and she needs to know what to do.  Please give them a call back.

## 2015-02-09 NOTE — Telephone Encounter (Signed)
Spoke to Daughter. She states the [facility] phone is asking her if she wants to call 911. I looked up pt and there are no current MD notifications in our system. I explained to her there is a function on the phone she could have accidentally hit that would give her this message. I also explained that if  911 was to be called it would be by LW or by [place] and they would not send a message through the phone.

## 2015-02-18 ENCOUNTER — Other Ambulatory Visit: Payer: Self-pay | Admitting: Endocrinology

## 2015-03-06 ENCOUNTER — Encounter: Payer: Self-pay | Admitting: Cardiology

## 2015-03-06 ENCOUNTER — Ambulatory Visit (INDEPENDENT_AMBULATORY_CARE_PROVIDER_SITE_OTHER): Payer: Medicare Other | Admitting: Cardiology

## 2015-03-06 VITALS — BP 130/70 | HR 82 | Ht 65.0 in | Wt 172.4 lb

## 2015-03-06 DIAGNOSIS — I951 Orthostatic hypotension: Secondary | ICD-10-CM | POA: Insufficient documentation

## 2015-03-06 DIAGNOSIS — Z7901 Long term (current) use of anticoagulants: Secondary | ICD-10-CM

## 2015-03-06 DIAGNOSIS — E785 Hyperlipidemia, unspecified: Secondary | ICD-10-CM

## 2015-03-06 DIAGNOSIS — I480 Paroxysmal atrial fibrillation: Secondary | ICD-10-CM | POA: Insufficient documentation

## 2015-03-06 DIAGNOSIS — I959 Hypotension, unspecified: Secondary | ICD-10-CM | POA: Insufficient documentation

## 2015-03-06 MED ORDER — DILTIAZEM HCL ER COATED BEADS 120 MG PO CP24: 120.0000 mg | ORAL_CAPSULE | Freq: Every day | ORAL | Status: DC

## 2015-03-06 NOTE — Progress Notes (Signed)
[place]. [address], [address], [place]  [address] Phone: [phone] Fax:  [phone]  Date:  03/06/2015   ID:  Melissa [name], DOB Aug 21, 1928, MRN [id number]  PCP:  [name], MD   History of Present Illness: TRULIE GUARD is a 81 y.o. female heart failure (since resolved). She has diabetes, hypertension, hyperlipidemia. Prior hospitalization in mid December 2012 secondary to altered mental status and increasing O2 demands. Her BNP was noted to be elevated at 3031.   Stood at desk at [employer], could not speak felt like she was loosing vision which occurred after she stood up from the chair.  Felt like could not speak. When EMS came, her blood pressure was low. Could not palpate. She was dehydrated. BP was orthostatic. CT scan and ECG OK. Felt fine since since then. Been off maxide since 2015.   Echocardiogram 2012 showed an EF of 45-50% mildly reduced. Chest x-ray showed no acute disease.   Stress test was performed on 11/08/11 and was low risk, no ischemia, normal EF actually.   Had event monitor 01/17/2015 which showed brief episode, 34 minutes of atrial fibrillation, fastest heart rate was 166 bpm at the time.    Wt Readings from Last 3 Encounters:  03/06/15 172 lb 6.4 oz (78.2 kg)  01/16/15 177 lb 3.2 oz (80.377 kg)  01/11/15 174 lb (78.926 kg)     Past Medical History  Diagnosis Date  . Asthma   . Sleep apnea   . Angina   . Hypertension   . Blood transfusion   . GERD (gastroesophageal reflux disease)   . Headache(784.0)   . Arthritis   . Anxiety   . Pneumonia   . Diabetes mellitus   . Anemia   . Hiatal hernia   . Acute kidney injury 09/26/2011  . High cholesterol   . Unspecified hypothyroidism 06/15/2013  . Cardiomyopathy 09/02/2013    EF 45%, 12/12    Past Surgical History  Procedure Laterality Date  . Abdominal hysterectomy    . Appendectomy    . Eye surgery    . Tonsillectomy    . Breast surgery      Current Outpatient  Prescriptions  Medication Sig Dispense Refill  . acetaminophen (TYLENOL) 500 MG tablet Take 1,000 mg by mouth every 6 (six) hours as needed.    Marland Kitchen amitriptyline (ELAVIL) 25 MG tablet Take 25 mg by mouth 2 (two) times daily.     Marland Kitchen amoxicillin-clavulanate (AUGMENTIN) 250-125 MG per tablet Take 0.5 tablets by mouth daily.     Marland Kitchen apixaban (ELIQUIS) 2.5 MG TABS tablet Take 1 tablet (2.5 mg total) by mouth 2 (two) times daily. 60 tablet 3  . atorvastatin (LIPITOR) 20 MG tablet Take 20 mg by mouth daily.      . BD PEN NEEDLE NANO U/F 32G X 4 MM MISC     . Cholecalciferol (VITAMIN D3) 1000 UNITS CAPS Take 1 capsule by mouth daily.    Marland Kitchen esomeprazole (NEXIUM) 40 MG capsule Take 40 mg by mouth 2 (two) times daily.      Marland Kitchen FLUoxetine (PROZAC) 20 MG capsule Take 20 mg by mouth daily.     . Insulin Aspart (NOVOLOG [place]) Inject 4 Units into the skin 3 (three) times daily.     . Insulin Detemir (LEVEMIR FLEXTOUCH) 100 UNIT/ML Pen INJECT 28 UNITS            SUBCUTANEOUSLY AT BEDTIME    . levalbuterol (  XOPENEX) 0.63 MG/3ML nebulizer solution Take 0.63 mg by nebulization every 6 (six) hours as needed for wheezing or shortness of breath.   0  . LEVEMIR FLEXTOUCH 100 UNIT/ML Pen INJECT 34 UNITS            SUBCUTANEOUSLY AT BEDTIME 45 mL 1  . levothyroxine (SYNTHROID, LEVOTHROID) 50 MCG tablet TAKE 1 TABLET BY MOUTH EVERY DAY 30 tablet 2  . Liraglutide (VICTOZA) 18 MG/3ML SOPN Inject 1.2 mg into the skin daily. 6 pen 5  . ONE TOUCH ULTRA TEST test strip USE TO CHECK BLOOD SUGAR 4 TIMES DAILY 125 each 3  . pregabalin (LYRICA) 50 MG capsule Take up to 3 capsules daily as needed (Patient taking differently: Take 150 mg by mouth 3 (three) times daily. Take up to 3 capsules daily as needed) 270 capsule 1  . senna (SENOKOT) 8.6 MG tablet Take 1 tablet by mouth as needed for constipation.     No current facility-administered medications for this visit.    Allergies:   No Known Allergies  Social History:  The patient   reports that she quit smoking about 53 years ago. She has never used smokeless tobacco. She reports that she does not drink alcohol or use illicit drugs.   ROS:  Please see the history of present illness.   Denies any syncope, bleeding, orthopnea, PND, shortness of breath  PHYSICAL EXAM: VS:  BP 130/70 mmHg  Pulse 82  Ht 5\' 5"  (1.651 m)  Wt 172 lb 6.4 oz (78.2 kg)  BMI 28.69 kg/m2 Well nourished, well developed, in no acute distress HEENT: normal Neck: no JVD Cardiac:  normal S1, S2; RRR; no murmur Lungs:  clear to auscultation bilaterally, no wheezing, rhonchi or rales Abd: soft, nontender, no hepatomegaly Ext: no edema today Skin: warm and dry Neuro: no focal abnormalities noted  EKG:  None today     ASSESSMENT AND PLAN:  1. Atrial fibrillation, paroxysmal-34 minutes noted on event monitor through April 2016. Heart rate consisted of rapid ventricular response. She was started on anticoagulation, apixaban 2.5 mg twice a day. Creatinine at one point was 1.8 and she is almost 81 years old. We discussed bleeding risks. 2. Near syncope-sounds mostly orthostatic in origin. She still.and started to lose her vision, could not speak. She has had an extensive neurologic evaluation 2 years ago, unremarkable. 3. Cardiomyopathy-mildly reduced LV systolic dysfunction in the past. No diuretic therapy currently because of relative hypotension. In fact, her last stress test showed a normal ejection fraction. I will check an echocardiogram since it has been a few years. 4. Hypertension-currently very well controlled. In fact, mildly low.  5. Hyperlipidemia-continue with low-dose statin. LDL 89 on 12/15/13 6. Chronic kidney disease stage III-creatinine 1.3 at last check. Avoid NSAIDs 7. See her back in 2 months  Signed, [name], MD [facility]  03/06/2015 2:43 PM

## 2015-03-06 NOTE — Patient Instructions (Signed)
Medication Instructions:  Please start Cardizem CD 120 mg a day. Continue all other medications as listed.  Testing/Procedures: Your physician has requested that you have an echocardiogram. Echocardiography is a painless test that uses sound waves to create images of your heart. It provides your doctor with information about the size and shape of your heart and how well your heart's chambers and valves are working. This procedure takes approximately one hour. There are no restrictions for this procedure.  Follow-Up: Follow up in 2 months with Dr [name].  Thank you for choosing [facility]!!

## 2015-04-03 ENCOUNTER — Ambulatory Visit (HOSPITAL_COMMUNITY): Payer: Medicare Other | Attending: Cardiology

## 2015-04-03 ENCOUNTER — Other Ambulatory Visit: Payer: Self-pay

## 2015-04-03 DIAGNOSIS — I480 Paroxysmal atrial fibrillation: Secondary | ICD-10-CM | POA: Diagnosis not present

## 2015-04-03 DIAGNOSIS — I4891 Unspecified atrial fibrillation: Secondary | ICD-10-CM | POA: Insufficient documentation

## 2015-04-03 DIAGNOSIS — R000 Tachycardia, unspecified: Secondary | ICD-10-CM | POA: Diagnosis not present

## 2015-04-13 ENCOUNTER — Other Ambulatory Visit: Payer: Medicare Other

## 2015-04-18 ENCOUNTER — Ambulatory Visit: Payer: Medicare Other | Admitting: Endocrinology

## 2015-04-19 ENCOUNTER — Ambulatory Visit: Payer: Medicare Other | Admitting: Endocrinology

## 2015-04-25 ENCOUNTER — Encounter: Payer: Self-pay | Admitting: Cardiology

## 2015-04-25 ENCOUNTER — Ambulatory Visit (INDEPENDENT_AMBULATORY_CARE_PROVIDER_SITE_OTHER): Payer: Medicare Other | Admitting: Cardiology

## 2015-04-25 VITALS — BP 130/68 | HR 100 | Ht 65.5 in | Wt 171.8 lb

## 2015-04-25 DIAGNOSIS — I519 Heart disease, unspecified: Secondary | ICD-10-CM | POA: Diagnosis not present

## 2015-04-25 DIAGNOSIS — Z7901 Long term (current) use of anticoagulants: Secondary | ICD-10-CM | POA: Diagnosis not present

## 2015-04-25 DIAGNOSIS — I429 Cardiomyopathy, unspecified: Secondary | ICD-10-CM | POA: Diagnosis not present

## 2015-04-25 DIAGNOSIS — I480 Paroxysmal atrial fibrillation: Secondary | ICD-10-CM

## 2015-04-25 MED ORDER — APIXABAN 2.5 MG PO TABS: 2.5000 mg | ORAL_TABLET | Freq: Two times a day (BID) | ORAL | Status: DC

## 2015-04-25 MED ORDER — METOPROLOL SUCCINATE ER 25 MG PO TB24: 25.0000 mg | ORAL_TABLET | Freq: Every day | ORAL | Status: DC

## 2015-04-25 NOTE — Patient Instructions (Signed)
Medication Instructions:  Please stop your Diltiazem and start Metoprolol succinate (Toprol) 25 mg a day. Continue all other medications as listed.  Follow-Up: Follow up in 4 months with [name].  You will receive a letter in the mail 2 months before you are due.  Please call us when you receive this letter to schedule your follow up appointment.  Thank you for choosing [facility]!!

## 2015-04-25 NOTE — Progress Notes (Signed)
[place]. [address], [address] [place], [place]  [address] Phone: [phone] Fax:  [phone]  Date:  04/25/2015   ID:  Melissa [name], DOB Feb 23, 1928, MRN [id number]  PCP:  [name], MD   History of Present Illness: Melissa [name] is a 81 y.o. female heart failure (since resolved). She has diabetes, hypertension, hyperlipidemia. Prior hospitalization in mid December 2012 secondary to altered mental status and increasing O2 demands. Her BNP was noted to be elevated at 3031.   Stood at desk at [employer], could not speak felt like she was loosing vision which occurred after she stood up from the chair.  Felt like could not speak. When EMS came, her blood pressure was low. Could not palpate. She was dehydrated. BP was orthostatic. CT scan and ECG OK. Felt fine since since then. Been off maxide since 2015.   Echocardiogram 2012 showed an EF of 45-50% mildly reduced. Chest x-ray showed no acute disease.   Stress test was performed on 11/08/11 and was low risk, no ischemia, normal EF actually.   Had event monitor 01/17/2015 which showed brief episode, 34 minutes of atrial fibrillation, fastest heart rate was 166 bpm at the time.    Wt Readings from Last 3 Encounters:  04/25/15 171 lb 12 oz (77.905 kg)  03/06/15 172 lb 6.4 oz (78.2 kg)  01/16/15 177 lb 3.2 oz (80.377 kg)     Past Medical History  Diagnosis Date  . Asthma   . Sleep apnea   . Angina   . Hypertension   . Blood transfusion   . GERD (gastroesophageal reflux disease)   . Headache(784.0)   . Arthritis   . Anxiety   . Pneumonia   . Diabetes mellitus   . Anemia   . Hiatal hernia   . Acute kidney injury 09/26/2011  . High cholesterol   . Unspecified hypothyroidism 06/15/2013  . Cardiomyopathy 09/02/2013    EF 45%, 12/12    Past Surgical History  Procedure Laterality Date  . Abdominal hysterectomy    . Appendectomy    . Eye surgery    . Tonsillectomy    . Breast surgery      Current Outpatient  Prescriptions  Medication Sig Dispense Refill  . acetaminophen (TYLENOL) 500 MG tablet Take 1,000 mg by mouth every 6 (six) hours as needed.    Marland Kitchen amitriptyline (ELAVIL) 25 MG tablet Take 25 mg by mouth 2 (two) times daily.     Marland Kitchen amoxicillin-clavulanate (AUGMENTIN) 250-125 MG per tablet Take 0.5 tablets by mouth daily.     Marland Kitchen amoxicillin-clavulanate (AUGMENTIN) 500-125 MG per tablet     . apixaban (ELIQUIS) 2.5 MG TABS tablet Take 1 tablet (2.5 mg total) by mouth 2 (two) times daily. 60 tablet 3  . atorvastatin (LIPITOR) 20 MG tablet Take 20 mg by mouth daily.      . BD PEN NEEDLE NANO U/F 32G X 4 MM MISC     . Cholecalciferol (VITAMIN D3) 1000 UNITS CAPS Take 1 capsule by mouth daily.    Marland Kitchen diltiazem (CARDIZEM CD) 120 MG 24 hr capsule Take 1 capsule (120 mg total) by mouth daily. 30 capsule 6  . esomeprazole (NEXIUM) 40 MG capsule Take 40 mg by mouth 2 (two) times daily.      Marland Kitchen FLUoxetine (PROZAC) 20 MG capsule Take 20 mg by mouth daily.     . Insulin Aspart (NOVOLOG [place]) Inject 4 Units into the skin 3 (three) times  daily.     . Insulin Detemir (LEVEMIR FLEXTOUCH) 100 UNIT/ML Pen INJECT 28 UNITS            SUBCUTANEOUSLY AT BEDTIME    . levalbuterol (XOPENEX) 0.63 MG/3ML nebulizer solution Take 0.63 mg by nebulization every 6 (six) hours as needed for wheezing or shortness of breath.   0  . LEVEMIR FLEXTOUCH 100 UNIT/ML Pen INJECT 34 UNITS            SUBCUTANEOUSLY AT BEDTIME 45 mL 1  . levothyroxine (SYNTHROID, LEVOTHROID) 50 MCG tablet TAKE 1 TABLET BY MOUTH EVERY DAY 30 tablet 2  . Liraglutide (VICTOZA) 18 MG/3ML SOPN Inject 1.2 mg into the skin daily. 6 pen 5  . ONE TOUCH ULTRA TEST test strip USE TO CHECK BLOOD SUGAR 4 TIMES DAILY 125 each 3  . pregabalin (LYRICA) 50 MG capsule Take up to 3 capsules daily as needed (Patient taking differently: Take 150 mg by mouth 3 (three) times daily. Take up to 3 capsules daily as needed) 270 capsule 1  . senna (SENOKOT) 8.6 MG tablet Take 1 tablet by mouth  daily.      No current facility-administered medications for this visit.    Allergies:   No Known Allergies  Social History:  The patient  reports that she quit smoking about 53 years ago. She has never used smokeless tobacco. She reports that she does not drink alcohol or use illicit drugs.   ROS:  Please see the history of present illness.   Denies any syncope, bleeding, orthopnea, PND, shortness of breath  PHYSICAL EXAM: VS:  BP 130/68 mmHg  Pulse 100  Ht 5' 5.5" (1.664 m)  Wt 171 lb 12 oz (77.905 kg)  BMI 28.14 kg/m2  SpO2 96% Well nourished, well developed, in no acute distress HEENT: normal Neck: no JVD Cardiac:  normal S1, S2; RRR/mildly tachycardic; no murmur Lungs:  clear to auscultation bilaterally, no wheezing, rhonchi or rales Abd: soft, nontender, no hepatomegaly Ext: no edema today Skin: warm and dry Neuro: no focal abnormalities noted  EKG:  None today     ASSESSMENT AND PLAN:  1. Atrial fibrillation, paroxysmal-34 minutes noted on event monitor through April 2016. Heart rate consisted of rapid ventricular response. She was started on anticoagulation, apixaban 2.5 mg twice a day. Creatinine at one point was 1.8 and she is almost 81 years old. We discussed bleeding risks. Instead of diltiazem 120 mg CD, I will place her on Toprol-XL 25 mg once a day. EF 45%. 2. Near syncope-no further episodes. Previously sounds mostly orthostatic in origin. She still.and started to lose her vision, could not speak. She has had an extensive neurologic evaluation 2 years ago, unremarkable. 3. Cardiomyopathy-mildly reduced LV systolic dysfunction in the past. No diuretic therapy currently because of relative hypotension. In fact, her last stress test showed a normal ejection fraction, however most recent echocardiogram shows a reduced left ventricular systolic function of [id number]. 4. Hypertension-currently very well controlled. In fact, mildly low.  5. Hyperlipidemia-continue with  low-dose statin. LDL 89 on 12/15/13 6. Chronic kidney disease stage III-creatinine 1.3 at last check. Avoid NSAIDs 7. See her back in 4 months, she will call me if she's having any difficulties with Toprol.  Signed, [name], MD [facility]  04/25/2015 12:12 PM

## 2015-06-28 ENCOUNTER — Other Ambulatory Visit (INDEPENDENT_AMBULATORY_CARE_PROVIDER_SITE_OTHER): Payer: Medicare Other

## 2015-06-28 DIAGNOSIS — E038 Other specified hypothyroidism: Secondary | ICD-10-CM | POA: Diagnosis not present

## 2015-06-28 DIAGNOSIS — E1165 Type 2 diabetes mellitus with hyperglycemia: Secondary | ICD-10-CM | POA: Diagnosis not present

## 2015-06-28 DIAGNOSIS — E063 Autoimmune thyroiditis: Secondary | ICD-10-CM

## 2015-06-28 DIAGNOSIS — IMO0002 Reserved for concepts with insufficient information to code with codable children: Secondary | ICD-10-CM

## 2015-06-28 LAB — BASIC METABOLIC PANEL
BUN: 15 mg/dL (ref 6–23)
CO2: 30 mEq/L (ref 19–32)
CREATININE: 1.18 mg/dL (ref 0.40–1.20)
Calcium: 9.1 mg/dL (ref 8.4–10.5)
Chloride: 101 mEq/L (ref 96–112)
GFR: 46.24 mL/min — AB (ref >60.00)
Glucose, Bld: 108 mg/dL — ABNORMAL HIGH (ref 70–99)
POTASSIUM: 4.1 meq/L (ref 3.5–5.1)
Sodium: 137 mEq/L (ref 135–145)

## 2015-06-28 LAB — HEMOGLOBIN A1C: HEMOGLOBIN A1C: 6.5 % (ref 4.6–6.5)

## 2015-06-28 LAB — TSH: TSH: 2.76 u[IU]/mL (ref 0.35–4.50)

## 2015-06-29 ENCOUNTER — Other Ambulatory Visit: Payer: Self-pay | Admitting: Cardiology

## 2015-07-03 ENCOUNTER — Encounter: Payer: Self-pay | Admitting: Endocrinology

## 2015-07-03 ENCOUNTER — Ambulatory Visit (INDEPENDENT_AMBULATORY_CARE_PROVIDER_SITE_OTHER): Payer: Medicare Other | Admitting: Endocrinology

## 2015-07-03 VITALS — BP 118/70 | HR 112 | Temp 98.1°F | Ht 65.0 in | Wt 166.0 lb

## 2015-07-03 DIAGNOSIS — E1142 Type 2 diabetes mellitus with diabetic polyneuropathy: Secondary | ICD-10-CM

## 2015-07-03 DIAGNOSIS — E038 Other specified hypothyroidism: Secondary | ICD-10-CM

## 2015-07-03 DIAGNOSIS — G629 Polyneuropathy, unspecified: Secondary | ICD-10-CM

## 2015-07-03 DIAGNOSIS — E063 Autoimmune thyroiditis: Secondary | ICD-10-CM

## 2015-07-03 NOTE — Patient Instructions (Signed)
Check blood sugars on waking up .. 3 .. times a week Also check blood sugars about 2 hours after a meal and do this after different meals by rotation  Recommended blood sugar levels on waking up is 90-130 and about 2 hours after meal is 140-180 Please bring blood sugar monitor to each visit.

## 2015-07-03 NOTE — Progress Notes (Signed)
Patient ID: Melissa [name], female   DOB: 19-Nov-1927, 81 y.o.   MRN: [id number]   Reason for Appointment: Diabetes follow-up   History of Present Illness   Diagnosis: Type 2 DIABETES MELITUS     The patient has been on a multidrug regimen and previously had poor control with A1c as high as 9.8% in 2013 Her blood sugars had improved with adding Victoza as well as mealtime insulin To her regimen of Levemir Her blood sugars have been fairly good with adding Victoza and has been able to maintain her weight better also She has been able to keep up with her multi-injection regimen without problems and gets help from family member  Recent history:   Insulin regimen:4 units AC tid NOVOLOG, LEVEMIR 26 hs    She previously had gained weight and with trying to cut back on portions her weight is coming down A1c is also better She is quite compliant with taking her mealtime doses before eating FASTING blood sugars are mostly in the low 100 range with current regimen of 26 Levemir at night Her Levemir has not been changed since 06/2014  Although her A1c previously had been as high as 7.1 and is now improved at 6.5 She does not adjust her suppertime insulin based on meal size or amount of carbohydrates.  She was told to try taking 6 units on the previous visit but is still taking 4 units She thinks some of her high readings from eating snacks in between supper and bedtime and also does not always use low-fat choices Does not have any significant increase in fasting blood sugars   Oral hypoglycemic drugs: None        Side effects from medications: None Proper timing of medications in relation to meals: Yes.          Monitors blood glucose:  2-3 times a day.    Glucometer: One Touch.          Blood Glucose readings from meter download:   Mean values apply above for all meters except median for One Touch  PRE-MEAL Fasting Lunch Dinner Bedtime Overall  Glucose range:  96-167  93-197    98-194   116-237    Mean/median:  120    162   162  141+/-35    Hypoglycemia: None          Meals:  She  may have a protein in the form of an egg or peanut butter at breakfast but sometimes only eating a muffin, eating supper at 5-6 PM     Physical activity: exercise: not able to walk much because of balance or dypnea           Dietician visit: Most recent: Unknown            Wt Readings from Last 3 Encounters:  07/03/15 166 lb (75.297 kg)  04/25/15 171 lb 12 oz (77.905 kg)  03/06/15 172 lb 6.4 oz (78.2 kg)   Lab Results  Component Value Date   HGBA1C 6.5 06/28/2015   HGBA1C 7.1* 01/12/2015   HGBA1C 5.9 09/13/2014   Lab Results  Component Value Date   MICROALBUR 1.4 01/12/2015   LDLCALC 89 12/15/2013   CREATININE 1.18 06/28/2015    Appointment on 06/28/2015  Component Date Value Ref Range Status  . Hgb A1c MFr Bld 06/28/2015 6.5  4.6 - 6.5 % Final   Glycemic Control Guidelines for People with Diabetes:Non Diabetic:  <6%Goal of Therapy: <7%Additional Action Suggested:  >  8%   . Sodium 06/28/2015 137  135 - 145 mEq/L Final  . Potassium 06/28/2015 4.1  3.5 - 5.1 mEq/L Final  . Chloride 06/28/2015 101  96 - 112 mEq/L Final  . CO2 06/28/2015 30  19 - 32 mEq/L Final  . Glucose, Bld 06/28/2015 108* 70 - 99 mg/dL Final  . BUN 06/28/2015 15  6 - 23 mg/dL Final  . Creatinine, Ser 06/28/2015 1.18  0.40 - 1.20 mg/dL Final  . Calcium 06/28/2015 9.1  8.4 - 10.5 mg/dL Final  . GFR 06/28/2015 46.24* >60.00 mL/min Final  . TSH 06/28/2015 2.76  0.35 - 4.50 uIU/mL Final       Medication List       This list is accurate as of: 07/03/15 11:59 PM.  Always use your most recent med list.               acetaminophen 500 MG tablet  Commonly known as:  TYLENOL  Take 1,000 mg by mouth every 6 (six) hours as needed.     amitriptyline 25 MG tablet  Commonly known as:  ELAVIL  Take 25 mg by mouth 2 (two) times daily.     amoxicillin-clavulanate 250-125 MG per tablet  Commonly known  as:  AUGMENTIN  Take 0.5 tablets by mouth daily.     amoxicillin-clavulanate 500-125 MG per tablet  Commonly known as:  AUGMENTIN     apixaban 2.5 MG Tabs tablet  Commonly known as:  ELIQUIS  Take 1 tablet (2.5 mg total) by mouth 2 (two) times daily.     atorvastatin 20 MG tablet  Commonly known as:  LIPITOR  Take 20 mg by mouth daily.     BD PEN NEEDLE NANO U/F 32G X 4 MM Misc  Generic drug:  Insulin Pen Needle     esomeprazole 40 MG capsule  Commonly known as:  NEXIUM  Take 40 mg by mouth 2 (two) times daily.     FLUoxetine 20 MG capsule  Commonly known as:  PROZAC  Take 20 mg by mouth daily.     levalbuterol 0.63 MG/3ML nebulizer solution  Commonly known as:  XOPENEX  Take 0.63 mg by nebulization every 6 (six) hours as needed for wheezing or shortness of breath.     LEVEMIR FLEXTOUCH 100 UNIT/ML Pen  Generic drug:  Insulin Detemir  INJECT 34 UNITS            SUBCUTANEOUSLY AT BEDTIME     LEVEMIR FLEXTOUCH 100 UNIT/ML Pen  Generic drug:  Insulin Detemir  INJECT 28 UNITS            SUBCUTANEOUSLY AT BEDTIME     levothyroxine 50 MCG tablet  Commonly known as:  SYNTHROID, LEVOTHROID  TAKE 1 TABLET BY MOUTH EVERY DAY     Liraglutide 18 MG/3ML Sopn  Commonly known as:  VICTOZA  Inject 1.2 mg into the skin daily.     metoprolol succinate 25 MG 24 hr tablet  Commonly known as:  TOPROL XL  Take 1 tablet (25 mg total) by mouth daily.     NOVOLOG [place]  Inject 4 Units into the skin 3 (three) times daily.     ONE TOUCH ULTRA TEST test strip  Generic drug:  glucose blood  USE TO CHECK BLOOD SUGAR 4 TIMES DAILY     pregabalin 50 MG capsule  Commonly known as:  LYRICA  Take up to 3 capsules daily as needed     senna 8.6 MG tablet  Commonly known as:  SENOKOT  Take 1 tablet by mouth daily.     Vitamin D3 1000 UNITS Caps  Take 1 capsule by mouth daily.        Allergies: No Known Allergies  Past Medical History  Diagnosis Date  . Asthma   . Sleep apnea   .  Angina   . Hypertension   . Blood transfusion   . GERD (gastroesophageal reflux disease)   . Headache(784.0)   . Arthritis   . Anxiety   . Pneumonia   . Diabetes mellitus   . Anemia   . Hiatal hernia   . Acute kidney injury 09/26/2011  . High cholesterol   . Unspecified hypothyroidism 06/15/2013  . Cardiomyopathy 09/02/2013    EF 45%, 12/12    Past Surgical History  Procedure Laterality Date  . Abdominal hysterectomy    . Appendectomy    . Eye surgery    . Tonsillectomy    . Breast surgery      Family History  Problem Relation Age of Onset  . Stroke Mother   . Heart disease Father     Social History:  reports that she quit smoking about 53 years ago. She has never used smokeless tobacco. She reports that she does not drink alcohol or use illicit drugs.  Review of Systems:   Renal insufficiency: She  had been followed by a nephrologist for renal insufficiency previously and has not followed up recently Her creatinine was significantly higher at 1.8 but has progressively improved now Not followed by nephrologist now   Lab Results  Component Value Date   CREATININE 1.18 06/28/2015   CREATININE 1.38* 01/12/2015   CREATININE 1.27* 12/24/2014    HYPERTENSION:  blood pressure is very mild and controlled with atenolol which she has taken for years, followed by cardiologist and is taking only 25 mg   Cardiac history: She may have had heart failure during a hospitalization for pneumonia and ejection fraction previously was 45%.  Not on diuretics  HYPERLIPIDEMIA: The lipid abnormality consists of elevated LDL treated with Lipitor. Has had high triglycerides  Last LDL about 100  Lab Results  Component Value Date   CHOL 167 12/15/2013   HDL 39.80 12/15/2013   LDLCALC 89 12/15/2013   LDLDIRECT 100.8 09/13/2014   TRIG 189.0* 12/15/2013   CHOLHDL 4 12/15/2013    Has had numbness in her feet and also has parasthesiae, gabapentin not tolerated because of  ?  Palpitations Her symptoms are better with Lyrica 50 mg taken bid She is asking about crawling symptoms in her back especially at bedtime  Also she has mild hypothyroidism since 5/14 treated with 50 mcg of levothyroxine with adequate control, TSH as follows: Her baseline TSH was 5.3.   Because of relatively higher TSH in 12/15 she is now taking 50 g alternating with 75 and her TSH is consistently normal   Lab Results  Component Value Date   TSH 2.76 06/28/2015   TSH 2.41 01/12/2015   TSH 4.91* 09/13/2014   FREET4 0.85 01/12/2015   FREET4 0.86 03/10/2014   FREET4 0.95 10/18/2013     Diabetic foot exam done in 4/16         Examination:   BP 118/70 mmHg  Pulse 112  Temp(Src) 98.1 F (36.7 C) (Oral)  Ht 5\' 5"  (1.651 m)  Wt 166 lb (75.297 kg)  BMI 27.62 kg/m2  SpO2 94%  Body mass index is 27.62 kg/(m^2).     No ankle  edema  ASSESSMENT/ PLAN:   Diabetes type 2   The patient's blood sugars are still reasonably controlled although she still has some postprandial hyperglycemia See history of present illness for detailed discussion of his current management, blood sugar patterns and problems identified Her control is adequate for her age and A1c is down to 6.5 Her weight is stable However she is unable to exercise because of her disabilities She is fairly compliant with checking her blood sugars and also insulin at mealtimes  Insulin changes: Increase suppertime dose by 2 units when planning to eat a larger meal or dessert No change in other insulin doses  Renal insufficiency: Creatinine has improved, previously was 1.8  Hypothyroidism: TSH is consistently normal recently    Patient Instructions  Check blood sugars on waking up .. 3 .. times a week Also check blood sugars about 2 hours after a meal and do this after different meals by rotation  Recommended blood sugar levels on waking up is 90-130 and about 2 hours after meal is 140-180 Please bring blood sugar  monitor to each visit.     KUMAR,AJAY 07/04/2015, 9:55 AM

## 2015-07-04 ENCOUNTER — Other Ambulatory Visit: Payer: Self-pay

## 2015-07-04 MED ORDER — GLUCOSE BLOOD VI STRP: ORAL_STRIP | Status: DC

## 2015-07-10 ENCOUNTER — Emergency Department (HOSPITAL_COMMUNITY): Payer: Medicare Other

## 2015-07-10 ENCOUNTER — Inpatient Hospital Stay (HOSPITAL_COMMUNITY)
Admission: EM | Admit: 2015-07-10 | Discharge: 2015-07-13 | DRG: 309 | Disposition: A | Discharge status: Home / Self Care | Payer: Medicare Other | Attending: Internal Medicine | Admitting: Internal Medicine

## 2015-07-10 ENCOUNTER — Encounter (HOSPITAL_COMMUNITY): Payer: Self-pay

## 2015-07-10 ENCOUNTER — Ambulatory Visit (INDEPENDENT_AMBULATORY_CARE_PROVIDER_SITE_OTHER): Payer: Medicare Other | Admitting: Family Medicine

## 2015-07-10 VITALS — BP 94/52 | HR 119 | Temp 97.3°F

## 2015-07-10 DIAGNOSIS — Z23 Encounter for immunization: Secondary | ICD-10-CM | POA: Diagnosis not present

## 2015-07-10 DIAGNOSIS — J45909 Unspecified asthma, uncomplicated: Secondary | ICD-10-CM | POA: Diagnosis present

## 2015-07-10 DIAGNOSIS — Z66 Do not resuscitate: Secondary | ICD-10-CM | POA: Diagnosis present

## 2015-07-10 DIAGNOSIS — Z90710 Acquired absence of both cervix and uterus: Secondary | ICD-10-CM | POA: Diagnosis not present

## 2015-07-10 DIAGNOSIS — I059 Rheumatic mitral valve disease, unspecified: Secondary | ICD-10-CM | POA: Diagnosis not present

## 2015-07-10 DIAGNOSIS — I480 Paroxysmal atrial fibrillation: Principal | ICD-10-CM

## 2015-07-10 DIAGNOSIS — R079 Chest pain, unspecified: Secondary | ICD-10-CM

## 2015-07-10 DIAGNOSIS — I420 Dilated cardiomyopathy: Secondary | ICD-10-CM

## 2015-07-10 DIAGNOSIS — R071 Chest pain on breathing: Secondary | ICD-10-CM | POA: Diagnosis not present

## 2015-07-10 DIAGNOSIS — R918 Other nonspecific abnormal finding of lung field: Secondary | ICD-10-CM | POA: Diagnosis not present

## 2015-07-10 DIAGNOSIS — N183 Chronic kidney disease, stage 3 unspecified: Secondary | ICD-10-CM

## 2015-07-10 DIAGNOSIS — G250 Essential tremor: Secondary | ICD-10-CM | POA: Diagnosis present

## 2015-07-10 DIAGNOSIS — R0600 Dyspnea, unspecified: Secondary | ICD-10-CM

## 2015-07-10 DIAGNOSIS — Z79899 Other long term (current) drug therapy: Secondary | ICD-10-CM | POA: Diagnosis not present

## 2015-07-10 DIAGNOSIS — I340 Nonrheumatic mitral (valve) insufficiency: Secondary | ICD-10-CM | POA: Diagnosis present

## 2015-07-10 DIAGNOSIS — E780 Pure hypercholesterolemia: Secondary | ICD-10-CM | POA: Diagnosis present

## 2015-07-10 DIAGNOSIS — Z8249 Family history of ischemic heart disease and other diseases of the circulatory system: Secondary | ICD-10-CM

## 2015-07-10 DIAGNOSIS — M1990 Unspecified osteoarthritis, unspecified site: Secondary | ICD-10-CM | POA: Diagnosis present

## 2015-07-10 DIAGNOSIS — Z794 Long term (current) use of insulin: Secondary | ICD-10-CM | POA: Diagnosis not present

## 2015-07-10 DIAGNOSIS — Z87891 Personal history of nicotine dependence: Secondary | ICD-10-CM

## 2015-07-10 DIAGNOSIS — E1149 Type 2 diabetes mellitus with other diabetic neurological complication: Secondary | ICD-10-CM

## 2015-07-10 DIAGNOSIS — R000 Tachycardia, unspecified: Secondary | ICD-10-CM

## 2015-07-10 DIAGNOSIS — I129 Hypertensive chronic kidney disease with stage 1 through stage 4 chronic kidney disease, or unspecified chronic kidney disease: Secondary | ICD-10-CM | POA: Diagnosis present

## 2015-07-10 DIAGNOSIS — I10 Essential (primary) hypertension: Secondary | ICD-10-CM

## 2015-07-10 DIAGNOSIS — E119 Type 2 diabetes mellitus without complications: Secondary | ICD-10-CM | POA: Diagnosis not present

## 2015-07-10 DIAGNOSIS — I5022 Chronic systolic (congestive) heart failure: Secondary | ICD-10-CM | POA: Diagnosis not present

## 2015-07-10 DIAGNOSIS — K219 Gastro-esophageal reflux disease without esophagitis: Secondary | ICD-10-CM | POA: Diagnosis present

## 2015-07-10 DIAGNOSIS — E1122 Type 2 diabetes mellitus with diabetic chronic kidney disease: Secondary | ICD-10-CM | POA: Diagnosis present

## 2015-07-10 DIAGNOSIS — R05 Cough: Secondary | ICD-10-CM | POA: Diagnosis not present

## 2015-07-10 DIAGNOSIS — J449 Chronic obstructive pulmonary disease, unspecified: Secondary | ICD-10-CM | POA: Diagnosis present

## 2015-07-10 DIAGNOSIS — E039 Hypothyroidism, unspecified: Secondary | ICD-10-CM | POA: Diagnosis present

## 2015-07-10 DIAGNOSIS — Z823 Family history of stroke: Secondary | ICD-10-CM | POA: Diagnosis not present

## 2015-07-10 DIAGNOSIS — F419 Anxiety disorder, unspecified: Secondary | ICD-10-CM | POA: Diagnosis present

## 2015-07-10 DIAGNOSIS — Z7901 Long term (current) use of anticoagulants: Secondary | ICD-10-CM | POA: Diagnosis not present

## 2015-07-10 DIAGNOSIS — R251 Tremor, unspecified: Secondary | ICD-10-CM | POA: Diagnosis not present

## 2015-07-10 DIAGNOSIS — D631 Anemia in chronic kidney disease: Secondary | ICD-10-CM | POA: Diagnosis present

## 2015-07-10 DIAGNOSIS — R42 Dizziness and giddiness: Secondary | ICD-10-CM | POA: Diagnosis present

## 2015-07-10 DIAGNOSIS — I429 Cardiomyopathy, unspecified: Secondary | ICD-10-CM | POA: Diagnosis not present

## 2015-07-10 DIAGNOSIS — J45901 Unspecified asthma with (acute) exacerbation: Secondary | ICD-10-CM

## 2015-07-10 DIAGNOSIS — Z8673 Personal history of transient ischemic attack (TIA), and cerebral infarction without residual deficits: Secondary | ICD-10-CM

## 2015-07-10 DIAGNOSIS — E1140 Type 2 diabetes mellitus with diabetic neuropathy, unspecified: Secondary | ICD-10-CM | POA: Diagnosis present

## 2015-07-10 DIAGNOSIS — I951 Orthostatic hypotension: Secondary | ICD-10-CM

## 2015-07-10 DIAGNOSIS — I519 Heart disease, unspecified: Secondary | ICD-10-CM

## 2015-07-10 DIAGNOSIS — D491 Neoplasm of unspecified behavior of respiratory system: Secondary | ICD-10-CM

## 2015-07-10 DIAGNOSIS — M5410 Radiculopathy, site unspecified: Secondary | ICD-10-CM

## 2015-07-10 DIAGNOSIS — E785 Hyperlipidemia, unspecified: Secondary | ICD-10-CM

## 2015-07-10 LAB — TROPONIN I: Troponin I: <0.03 ng/mL (ref <0.031)

## 2015-07-10 LAB — CBC WITH DIFFERENTIAL/PLATELET
Basophils Absolute: 0 10*3/uL (ref 0.0–0.1)
Basophils Relative: 1 %
Eosinophils Absolute: 0.2 10*3/uL (ref 0.0–0.7)
Eosinophils Relative: 3 %
HEMATOCRIT: 35.6 % — AB (ref 36.0–46.0)
HEMOGLOBIN: 11.2 g/dL — AB (ref 12.0–15.0)
LYMPHS ABS: 2.1 10*3/uL (ref 0.7–4.0)
LYMPHS PCT: 29 %
MCH: 26.9 pg (ref 26.0–34.0)
MCHC: 31.5 g/dL (ref 30.0–36.0)
MCV: 85.4 fL (ref 78.0–100.0)
MONOS PCT: 7 %
Monocytes Absolute: 0.5 10*3/uL (ref 0.1–1.0)
NEUTROS ABS: 4.5 10*3/uL (ref 1.7–7.7)
NEUTROS PCT: 60 %
Platelets: 256 10*3/uL (ref 150–400)
RBC: 4.17 MIL/uL (ref 3.87–5.11)
RDW: 13.9 % (ref 11.5–15.5)
WBC: 7.4 10*3/uL (ref 4.0–10.5)

## 2015-07-10 LAB — URINALYSIS, ROUTINE W REFLEX MICROSCOPIC
Bilirubin Urine: NEGATIVE
GLUCOSE, UA: NEGATIVE mg/dL
HGB URINE DIPSTICK: NEGATIVE
KETONES UR: NEGATIVE mg/dL
Leukocytes, UA: NEGATIVE
Nitrite: NEGATIVE
PH: 5.5 (ref 5.0–8.0)
PROTEIN: NEGATIVE mg/dL
Specific Gravity, Urine: 1.01 (ref 1.005–1.030)
Urobilinogen, UA: 0.2 mg/dL (ref 0.0–1.0)

## 2015-07-10 LAB — GLUCOSE, POCT (MANUAL RESULT ENTRY): POC Glucose: 220 mg/dl — AB (ref 70–99)

## 2015-07-10 LAB — BASIC METABOLIC PANEL
Anion gap: 7 (ref 5–15)
BUN: 13 mg/dL (ref 6–20)
CO2: 26 mmol/L (ref 22–32)
CREATININE: 1.25 mg/dL — AB (ref 0.44–1.00)
Calcium: 8.8 mg/dL — ABNORMAL LOW (ref 8.9–10.3)
Chloride: 105 mmol/L (ref 101–111)
GFR calc Af Amer: 44 mL/min — ABNORMAL LOW (ref >60)
GFR, EST NON AFRICAN AMERICAN: 38 mL/min — AB (ref >60)
GLUCOSE: 168 mg/dL — AB (ref 65–99)
Potassium: 3.8 mmol/L (ref 3.5–5.1)
SODIUM: 138 mmol/L (ref 135–145)

## 2015-07-10 MED ORDER — HEPARIN (PORCINE) IN NACL 100-0.45 UNIT/ML-% IJ SOLN
1000.0000 [IU]/h | INTRAMUSCULAR | Status: DC
  Filled 2015-07-10: qty 250

## 2015-07-10 MED ORDER — NITROGLYCERIN 0.4 MG SL SUBL
0.4000 mg | SUBLINGUAL_TABLET | SUBLINGUAL | Status: DC | PRN
  Administered 2015-07-10: 0.4 mg via SUBLINGUAL
  Filled 2015-07-10: qty 1

## 2015-07-10 MED ORDER — NITROGLYCERIN 0.6 MG SL SUBL
0.6000 mg | SUBLINGUAL_TABLET | SUBLINGUAL | Status: DC | PRN
  Filled 2015-07-10: qty 100

## 2015-07-10 MED ORDER — ASPIRIN 81 MG PO CHEW
324.0000 mg | CHEWABLE_TABLET | Freq: Once | ORAL | Status: AC
  Administered 2015-07-10: 324 mg via ORAL
  Filled 2015-07-10: qty 4

## 2015-07-10 MED ORDER — NITROGLYCERIN 2 % TD OINT
0.5000 [in_us] | TOPICAL_OINTMENT | Freq: Three times a day (TID) | TRANSDERMAL | Status: DC
  Administered 2015-07-10 – 2015-07-12 (×5): 0.5 [in_us] via TOPICAL
  Filled 2015-07-10: qty 30

## 2015-07-10 MED ORDER — DILTIAZEM HCL 100 MG IV SOLR
5.0000 mg/h | INTRAVENOUS | Status: DC
  Administered 2015-07-10: 5 mg/h via INTRAVENOUS
  Filled 2015-07-10: qty 100

## 2015-07-10 NOTE — Progress Notes (Signed)
Patient ID: Melissa [name], female    DOB: [birth year]  Age: 81 y.o. MRN: [id number]  Chief Complaint  Patient presents with  . Shaking    hands and head  . Chest Pain    pains goes to back as well  . Shortness of Breath    Subjective:   81 year old lady who came in to the urgent care with a acute pain in her chest. We will called urgently to see her from caring for other patients. A PA assisted me in the care. The patient complains of since last night having substernal and back pain. She has been extremely short of breath intermittently, just feels like she can't get her air in. She has felt a tightness in the chest. She has tried to breathing treatments at home, and has become very jittery. She apparently has the shakiness and jitteriness intermittently. This is worse than usual. She has a cardiologist, Dr. Gypsy Balsam, and she is seen 2 months ago. She has had a heart workup and has had paroxysmal atrial fibrillation and is on L Quist for that. She has a diabetic, last took her insulin last night. She has not had any syncope. No headache or dizziness but feels weak and shaky. No nausea or vomiting. No musculoskeletal symptoms. No other problems on brief review of systems. She has not been swelling a lot.  Chart was reviewed quickly. Patient had an EKG read which shows rapid atrial fibrillation. Her oxygen saturation was good. Current allergies, medications, problem list, past/family and social histories reviewed.  Objective:  BP 94/52 mmHg  Pulse 119  Temp(Src) 97.3 F (36.3 C)  SpO2 98%  throat clear. Neck without obvious JVD. Chest has very shallow rapid respirations. Difficult to hear the heart sounds with the shell respirations but she has a rapid irregular heart. Abdomen soft and nontender. Extremities have a little bit of edema. Patient is alert and oriented. She is currently by her daughter.  EKG shows atrial fibrillation  O2 saturation good at 98%  Glucose 220 on fingersticks  PA  started the IV and EMS arrived quickly. Patient was transferred to the hospital.  Assessment & Plan:   Assessment: 1. Chest pain, unspecified chest pain type   2. Type 2 diabetes mellitus without complication       Plan:   Orders Placed This Encounter  Procedures  . POCT glucose (manual entry)  . EKG 12-Lead   35 minutes of acute and critical        There are no Patient Instructions on file for this visit.   No Follow-up on file.   HOPPER,DAVID, MD 07/10/2015

## 2015-07-10 NOTE — Patient Instructions (Signed)
EMS to [facility]

## 2015-07-10 NOTE — Progress Notes (Addendum)
ANTICOAGULATION CONSULT NOTE - Initial Consult  Pharmacy Consult for heparin Indication: atrial fibrillation  No Known Allergies  Patient Measurements: Height: 5\' 5"  (165.1 cm) Weight: 166 lb 0.1 oz (75.3 kg) IBW/kg (Calculated) : 57  Vital Signs: Temp: 98.2 F (36.8 C) (09/26 1932) Temp Source: Oral (09/26 1932) BP: 86/54 mmHg (09/26 2321) Pulse Rate: 112 (09/26 2321)  Labs:  Recent Labs  07/10/15 2013  HGB 11.2*  HCT 35.6*  PLT 256  CREATININE 1.25*  TROPONINI <0.03    Estimated Creatinine Clearance: 33.4 mL/min (by C-G formula based on Cr of 1.25).   Medical History: Past Medical History  Diagnosis Date  . Asthma   . Sleep apnea   . Angina   . Hypertension   . Blood transfusion   . GERD (gastroesophageal reflux disease)   . Headache(784.0)   . Arthritis   . Anxiety   . Pneumonia   . Diabetes mellitus   . Anemia   . Hiatal hernia   . Acute kidney injury 09/26/2011  . High cholesterol   . Unspecified hypothyroidism 06/15/2013  . Cardiomyopathy 09/02/2013    EF 45%, 12/12     Assessment: 81yo female c/o CP and SOB, went to [facility] where pt was found to be in Afib w/ RVR w/ HR to 160s, to transition to heparin from Eliquis, last dose this am at 1000.  Goal of Therapy:  Heparin level 0.3-0.7 units/ml aPTT 66-102 seconds Monitor platelets by anticoagulation protocol: Yes   Plan:  Will begin heparin gtt at 1000 units/hr and monitor heparin levels, aPTT, and CBC; will need to use PTT while Eliquis clearing.  Wynona Neat, PharmD, BCPS  07/10/2015,11:37 PM    ADDENDUM: Plan to continue Eliquis for now, will start heparin if needed. VB 07/10/2015 11:46 PM

## 2015-07-10 NOTE — H&P (Signed)
[facility] History and Physical  Melissa [name] [id number] DOB: April 06, 1928 DOA: 07/10/2015  Referring physician: ED PCP: [name], MD  Specialists: Cardiology-spoke with [name]   81 y/o ? Prior history heart failure 2012 echocardiogram 03/2015 EF 45-50% Stress test 11/08/11 low risk Paroxysmal A. fib documented/2016 on event monitor-Chad score above 4, currently on apixaban 2.5 twice a day Sees cardiologist [name] History chronic kidney disease stage III Hyperlipidemia Hypertension Near syncope secondary to orthostasis Prior TIA and concussion with dizziness for the past couple of years   Presented with 1 day history of chest discomfort as well as shortness of breath pain was coming and going and she took some breathing treatments which did not seem to help. It got worse during the day To her awake at night. She usually uses 2 pillows to sleep but had to use for and ultimately decided to sleep in the recliner outside as it was cooler than she felt less smothered. She describes the pain in her chest as being "loaded" and a "heaviness" She tells me she felt this before 2012 She takes her meds regularly and complains of a persistent cough that she's had since December last her on and off with some sputum. She's not had a fever any chills At the same time as having the pain in her chest she felt like her heart was racing and this is why she went to see the urgent care center She states that the pain is unrelieved in the emergency room but is better now after resting.  She is reasonably high functioning at home but states that when she walks for too long a distance she feels winded Tells me that her granddaughter sets up her meds and does not know for sure what medication she is on  Last saw [name] last week and Hr was about 110 in that office visit   Workup in the emergency room BUN/creatinine 13/1.2 Baseline about the same Chest x-ray two-view no acute  cardiopulmonary distress EKG showed atrial fibrillation rate about 110 QRS axis 25 no ST-T wave changes    Past Medical History  Diagnosis Date  . Asthma   . Sleep apnea   . Angina   . Hypertension   . Blood transfusion   . GERD (gastroesophageal reflux disease)   . Headache(784.0)   . Arthritis   . Anxiety   . Pneumonia   . Diabetes mellitus   . Anemia   . Hiatal hernia   . Acute kidney injury 09/26/2011  . High cholesterol   . Unspecified hypothyroidism 06/15/2013  . Cardiomyopathy 09/02/2013    EF 45%, 12/12   Past Surgical History  Procedure Laterality Date  . Abdominal hysterectomy    . Appendectomy    . Eye surgery    . Tonsillectomy    . Breast surgery     Social History:  Social History   Social History Narrative   Pt lives at home with her daughter and son in [employer].   Caffeine Use: very little    No Known Allergies  Family History  Problem Relation Age of Onset  . Stroke Mother   . Heart disease Father     Prior to Admission medications   Medication Sig Start Date End Date Taking? Authorizing Provider  acetaminophen (TYLENOL) 500 MG tablet Take 1,000 mg by mouth every 6 (six) hours as needed for mild pain.    Yes Historical Provider, MD  amitriptyline (ELAVIL) 25 MG tablet Take 50  mg by mouth at bedtime.    Yes Historical Provider, MD  amoxicillin-clavulanate (AUGMENTIN) 500-125 MG per tablet Take 1 tablet by mouth daily.  06/17/15  Yes Historical Provider, MD  apixaban (ELIQUIS) 2.5 MG TABS tablet Take 1 tablet (2.5 mg total) by mouth 2 (two) times daily. 04/25/15  Yes [name], MD  atorvastatin (LIPITOR) 20 MG tablet Take 20 mg by mouth daily.     Yes Historical Provider, MD  Cholecalciferol (VITAMIN D3) 1000 UNITS CAPS Take 1 capsule by mouth daily.   Yes Historical Provider, MD  esomeprazole (NEXIUM) 40 MG capsule Take 40 mg by mouth 2 (two) times daily.     Yes Historical Provider, MD  Fe Fum-FePoly-Vit C-Vit B3 (INTEGRA) 62.5-62.5-40-3 MG CAPS  Take 1 capsule by mouth daily. 04/16/15  Yes Historical Provider, MD  FLUoxetine (PROZAC) 20 MG capsule Take 20 mg by mouth daily.  12/24/12  Yes Historical Provider, MD  Insulin Aspart (NOVOLOG Dustin) Inject 4 Units into the skin 3 (three) times daily.    Yes Historical Provider, MD  Insulin Detemir (LEVEMIR FLEXTOUCH) 100 UNIT/ML Pen INJECT 28 UNITS            SUBCUTANEOUSLY AT BEDTIME   Yes [name], MD  levalbuterol (XOPENEX) 0.63 MG/3ML nebulizer solution Take 0.63 mg by nebulization every 6 (six) hours as needed for wheezing or shortness of breath.  09/28/14  Yes Historical Provider, MD  LEVEMIR FLEXTOUCH 100 UNIT/ML Pen INJECT 34 UNITS            SUBCUTANEOUSLY AT BEDTIME 02/20/15  Yes [name], MD  levothyroxine (SYNTHROID, LEVOTHROID) 50 MCG tablet TAKE 1 TABLET BY MOUTH EVERY DAY 06/23/13  Yes [name], MD  Liraglutide (VICTOZA) 18 MG/3ML SOPN Inject 1.2 mg into the skin daily. 07/21/14  Yes [name], MD  metoprolol succinate (TOPROL XL) 25 MG 24 hr tablet Take 1 tablet (25 mg total) by mouth daily. 04/25/15  Yes [name], MD  pregabalin (LYRICA) 50 MG capsule Take up to 3 capsules daily as needed Patient taking differently: Take 150 mg by mouth 2 (two) times daily. Take up to 3 capsules daily as needed 10/31/14  Yes [name], MD   Physical Exam: Filed Vitals:   07/10/15 2215 07/10/15 2230 07/10/15 2245 07/10/15 2321  BP: 116/53 108/57 113/54 86/54  Pulse: 105 111 103 112  Temp:      TempSrc:      Resp: 16 13 10 20   SpO2: 97% 97% 98% 97%    EOMI NCAT, seems a little anxious, slight tremor 2 head and body No pallor no icterus no JVD no bruit Irregularly irregular S1-S2 tachycardic Abdomen soft nontender nondistended no rebound no guarding Power 5/5 bilaterally Skin intact     Labs on Admission:  Basic Metabolic Panel:  Recent Labs Lab 07/10/15 2013  NA 138  K 3.8  CL 105  CO2 26  GLUCOSE 168*  BUN 13  CREATININE 1.25*  CALCIUM 8.8*   Liver Function  Tests: No results for input(s): AST, ALT, ALKPHOS, BILITOT, PROT, ALBUMIN in the last 168 hours. No results for input(s): LIPASE, AMYLASE in the last 168 hours. No results for input(s): AMMONIA in the last 168 hours. CBC:  Recent Labs Lab 07/10/15 2013  WBC 7.4  NEUTROABS 4.5  HGB 11.2*  HCT 35.6*  MCV 85.4  PLT 256   Cardiac Enzymes:  Recent Labs Lab 07/10/15 2013  TROPONINI <0.03    BNP (last 3 results)  No results for input(s): BNP in the last 8760 hours.  ProBNP (last 3 results) No results for input(s): PROBNP in the last 8760 hours.  CBG: No results for input(s): GLUCAP in the last 168 hours.  Radiological Exams on Admission: Dg Chest 2 View  07/10/2015   CLINICAL DATA:  Chest pain and chest pressure for 1 day  EXAM: CHEST  2 VIEW  COMPARISON:  September 24, 2011  FINDINGS: The heart size and mediastinal contours are within normal limits. There is no focal infiltrate, pulmonary edema, or pleural effusion. The visualized skeletal structures are unremarkable.  IMPRESSION: No active cardiopulmonary disease.   Electronically Signed   By: Abelardo Diesel M.D.   On: 07/10/2015 20:23      Assessment/Plan Principal Problem:   Paroxysmal a-fib with chads score above 4 on anticoagulation and chest pain -Placed on Cardizem drip to titrate -Given 0.5 mg nitroglycerin under the tongue which relieved her pain in the chest by 50% Discussed with cardiology [name] who think that this may be tachyarrhythmia related-Recommends may be holding off of nitroglycerin and controlling the rate  -Continue to cycle troponins  -Patient given aspirin 325 in the emergency room by me  -Hold off on metoprolol 25 XL for right now  -Cardiology to evaluate patient formally in the a.m.  -Continue Apixaban 2.5 twice a day for now and no need for IV heparin right now per cardiology , EKG in a.m.  Active Problems:    Cardiomyopathy -Needs to be monitored during hospital stay    Chronic kidney  disease, stage III (moderate) -Hold on diarrhetic supplements    prior TIA and dizziness with persistent head shaking -Outpatient follow-up with Dr. Rexene Alberts of neurology -Recommend screening for either Parkinson's or essential tremor treatment which can be helped with beta blocker  Hyperlipidemia -Outpatient aggressive management  DO NOT RESUSCITATE  Time spent:  95  SAMTANI, [facility] Pager [phone]  If 7PM-7AM, please contact night-coverage [email or web address] Password [facility] 07/10/2015, 11:33 PM

## 2015-07-10 NOTE — ED Provider Notes (Signed)
CSN: [id number]     Arrival date & time 07/10/15  1927 History   First MD Initiated Contact with Patient 07/10/15 1929     Chief Complaint  Patient presents with  . Atrial Fibrillation  . Shortness of Breath     (Consider location/radiation/quality/duration/timing/severity/associated sxs/prior Treatment) Patient is a 81 y.o. female presenting with atrial fibrillation and shortness of breath. The history is provided by the patient.  Atrial Fibrillation Associated symptoms include shortness of breath. Pertinent negatives include no chest pain, no abdominal pain and no headaches.  Shortness of Breath Associated symptoms: wheezing   Associated symptoms: no abdominal pain, no chest pain, no headaches, no rash and no vomiting    patient with shortness of breath. Seen at primary care doctor and found to be in A. fib with RVR. Has a history of paroxysmal A. fib and is on metoprolol and Xarelto. Also has had some mid chest pain. Had shortness of breath. Also had some anxiety with the episode. Was given Cardizem by PCP and rate has gone from 160 down to slightly over 100. States she feels somewhat better here no swelling or leg. Pain is dull in her chest. She has had some wheezing over last couple days. No fevers or chills.  Past Medical History  Diagnosis Date  . Asthma   . Sleep apnea   . Angina   . Hypertension   . Blood transfusion   . GERD (gastroesophageal reflux disease)   . Headache(784.0)   . Arthritis   . Anxiety   . Pneumonia   . Diabetes mellitus   . Anemia   . Hiatal hernia   . Acute kidney injury 09/26/2011  . High cholesterol   . Unspecified hypothyroidism 06/15/2013  . Cardiomyopathy 09/02/2013    EF 45%, 12/12   Past Surgical History  Procedure Laterality Date  . Abdominal hysterectomy    . Appendectomy    . Eye surgery    . Tonsillectomy    . Breast surgery     Family History  Problem Relation Age of Onset  . Stroke Mother   . Heart disease Father    Social  History  Substance Use Topics  . Smoking status: Former Smoker -- 1.00 packs/day for 20 years    Quit date: 09/21/1961  . Smokeless tobacco: Never Used  . Alcohol Use: No   OB History    No data available     Review of Systems  Constitutional: Negative for activity change and appetite change.  Eyes: Negative for pain.  Respiratory: Positive for shortness of breath and wheezing. Negative for chest tightness.   Cardiovascular: Negative for chest pain and leg swelling.  Gastrointestinal: Negative for nausea, vomiting, abdominal pain and diarrhea.  Genitourinary: Negative for flank pain.  Musculoskeletal: Negative for back pain and neck stiffness.  Skin: Negative for rash.  Neurological: Negative for weakness, numbness and headaches.  Psychiatric/Behavioral: Negative for behavioral problems. The patient is nervous/anxious.       Allergies  Review of patient's allergies indicates no known allergies.  Home Medications   Prior to Admission medications   Medication Sig Start Date End Date Taking? Authorizing Alexas Basulto  acetaminophen (TYLENOL) 500 MG tablet Take 1,000 mg by mouth every 6 (six) hours as needed for mild pain.    Yes Historical Elif Yonts, MD  amitriptyline (ELAVIL) 25 MG tablet Take 50 mg by mouth at bedtime.    Yes Historical Adaiah Morken, MD  amoxicillin-clavulanate (AUGMENTIN) 500-125 MG per tablet Take 1 tablet by mouth  daily.  06/17/15  Yes Historical Darby Fleeman, MD  apixaban (ELIQUIS) 2.5 MG TABS tablet Take 1 tablet (2.5 mg total) by mouth 2 (two) times daily. 04/25/15  Yes [name], MD  atorvastatin (LIPITOR) 20 MG tablet Take 20 mg by mouth daily.     Yes Historical Demesha Boorman, MD  Cholecalciferol (VITAMIN D3) 1000 UNITS CAPS Take 1 capsule by mouth daily.   Yes Historical Azir Muzyka, MD  esomeprazole (NEXIUM) 40 MG capsule Take 40 mg by mouth 2 (two) times daily.     Yes Historical Aikeem Lilley, MD  Fe Fum-FePoly-Vit C-Vit B3 (INTEGRA) 62.5-62.5-40-3 MG CAPS Take 1 capsule by  mouth daily. 04/16/15  Yes Historical Maddix Heinz, MD  FLUoxetine (PROZAC) 20 MG capsule Take 20 mg by mouth daily.  12/24/12  Yes Historical Dyllan Kats, MD  Insulin Aspart (NOVOLOG Reyno) Inject 4 Units into the skin 3 (three) times daily.    Yes Historical Caliber Landess, MD  Insulin Detemir (LEVEMIR FLEXTOUCH) 100 UNIT/ML Pen INJECT 28 UNITS            SUBCUTANEOUSLY AT BEDTIME   Yes [name], MD  levalbuterol (XOPENEX) 0.63 MG/3ML nebulizer solution Take 0.63 mg by nebulization every 6 (six) hours as needed for wheezing or shortness of breath.  09/28/14  Yes Historical Artisha Capri, MD  LEVEMIR FLEXTOUCH 100 UNIT/ML Pen INJECT 34 UNITS            SUBCUTANEOUSLY AT BEDTIME 02/20/15  Yes [name], MD  levothyroxine (SYNTHROID, LEVOTHROID) 50 MCG tablet TAKE 1 TABLET BY MOUTH EVERY DAY 06/23/13  Yes [name], MD  Liraglutide (VICTOZA) 18 MG/3ML SOPN Inject 1.2 mg into the skin daily. 07/21/14  Yes [name], MD  metoprolol succinate (TOPROL XL) 25 MG 24 hr tablet Take 1 tablet (25 mg total) by mouth daily. 04/25/15  Yes [name], MD  pregabalin (LYRICA) 50 MG capsule Take up to 3 capsules daily as needed Patient taking differently: Take 150 mg by mouth 2 (two) times daily. Take up to 3 capsules daily as needed 10/31/14  Yes [name], MD   BP 111/50 mmHg  Pulse 114  Temp(Src) 98.2 F (36.8 C) (Oral)  Resp 15  Ht 5\' 5"  (1.651 m)  Wt 166 lb 0.1 oz (75.3 kg)  BMI 27.62 kg/m2  SpO2 93% Physical Exam  Constitutional: She appears well-developed.  HENT:  Head: Normocephalic.  Neck: Neck supple.  Cardiovascular:  Irregular tachycardia  Pulmonary/Chest:  Tachypnea and mild diffuse wheezes.  Abdominal: Soft.  Musculoskeletal: She exhibits no edema.  Neurological: She is alert.  Skin: Skin is warm.  Psychiatric:  Patient appears somewhat anxious    ED Course  Procedures (including critical care time) Labs Review Labs Reviewed  CBC WITH DIFFERENTIAL/PLATELET - Abnormal; Notable for the  following:    Hemoglobin 11.2 (*)    HCT 35.6 (*)    All other components within normal limits  BASIC METABOLIC PANEL - Abnormal; Notable for the following:    Glucose, Bld 168 (*)    Creatinine, Ser 1.25 (*)    Calcium 8.8 (*)    GFR calc non Af Amer 38 (*)    GFR calc Af Amer 44 (*)    All other components within normal limits  URINALYSIS, ROUTINE W REFLEX MICROSCOPIC (NOT AT [facility]) - Abnormal; Notable for the following:    APPearance CLOUDY (*)    All other components within normal limits  MRSA PCR SCREENING  TROPONIN I    Imaging Review Dg Chest 2  View  07/10/2015   CLINICAL DATA:  Chest pain and chest pressure for 1 day  EXAM: CHEST  2 VIEW  COMPARISON:  September 24, 2011  FINDINGS: The heart size and mediastinal contours are within normal limits. There is no focal infiltrate, pulmonary edema, or pleural effusion. The visualized skeletal structures are unremarkable.  IMPRESSION: No active cardiopulmonary disease.   Electronically Signed   By: Abelardo Diesel M.D.   On: 07/10/2015 20:23   I have personally reviewed and evaluated these images and lab results as part of my medical decision-making.   EKG Interpretation   Date/Time:  Monday July 10 2015 19:30:58 EDT Ventricular Rate:  109 PR Interval:    QRS Duration: 86 QT Interval:  350 QTC Calculation: 471 R Axis:   37 Text Interpretation:  Atrial fibrillation Confirmed by PICKERING  MD,  Ovid Curd [phone]) on 07/10/2015 7:41:23 PM      MDM   Final diagnoses:  Paroxysmal atrial fibrillation  Chest pain, unspecified chest pain type    Patient with atrial fibrillation and chest pain. Initially the wheezing that has resolved. Initial troponin negative. Rate improved. Will admit to internal medicine after discussion with cardiology.    Davonna Belling, MD 07/11/15 [phone]

## 2015-07-10 NOTE — ED Notes (Signed)
Per EMS: Pt is insulin dependant. CBG = 230 at Urgent Care.

## 2015-07-10 NOTE — ED Notes (Signed)
Per EMS: Pt complaining of chest pain and SOB. Went to urgent care, saw afib with RVR, HR 160's. Urgent Care started 20G in L AC. EMS gave 2 doses of 10mg  Diliazem. HR now in 90's. BP 120/70. Pt complaining of SOB x several days. 100% 2L O2.

## 2015-07-11 ENCOUNTER — Encounter (HOSPITAL_COMMUNITY): Payer: Self-pay | Admitting: *Deleted

## 2015-07-11 ENCOUNTER — Inpatient Hospital Stay (HOSPITAL_COMMUNITY): Payer: Medicare Other

## 2015-07-11 LAB — CBC WITH DIFFERENTIAL/PLATELET
BASOS PCT: 1 %
Basophils Absolute: 0.1 10*3/uL (ref 0.0–0.1)
EOS ABS: 0.3 10*3/uL (ref 0.0–0.7)
EOS PCT: 4 %
HCT: 36.5 % (ref 36.0–46.0)
Hemoglobin: 11.6 g/dL — ABNORMAL LOW (ref 12.0–15.0)
LYMPHS ABS: 2.9 10*3/uL (ref 0.7–4.0)
Lymphocytes Relative: 42 %
MCH: 27.6 pg (ref 26.0–34.0)
MCHC: 31.8 g/dL (ref 30.0–36.0)
MCV: 86.9 fL (ref 78.0–100.0)
MONOS PCT: 8 %
Monocytes Absolute: 0.5 10*3/uL (ref 0.1–1.0)
Neutro Abs: 3.1 10*3/uL (ref 1.7–7.7)
Neutrophils Relative %: 45 %
PLATELETS: 233 10*3/uL (ref 150–400)
RBC: 4.2 MIL/uL (ref 3.87–5.11)
RDW: 14.3 % (ref 11.5–15.5)
WBC: 7 10*3/uL (ref 4.0–10.5)

## 2015-07-11 LAB — GLUCOSE, CAPILLARY
GLUCOSE-CAPILLARY: 119 mg/dL — AB (ref 65–99)
GLUCOSE-CAPILLARY: 177 mg/dL — AB (ref 65–99)

## 2015-07-11 LAB — COMPREHENSIVE METABOLIC PANEL
ALBUMIN: 3.3 g/dL — AB (ref 3.5–5.0)
ALK PHOS: 55 U/L (ref 38–126)
ALT: 11 U/L — AB (ref 14–54)
AST: 19 U/L (ref 15–41)
Anion gap: 7 (ref 5–15)
BUN: 11 mg/dL (ref 6–20)
CALCIUM: 8.9 mg/dL (ref 8.9–10.3)
CHLORIDE: 102 mmol/L (ref 101–111)
CO2: 27 mmol/L (ref 22–32)
CREATININE: 1.11 mg/dL — AB (ref 0.44–1.00)
GFR calc Af Amer: 51 mL/min — ABNORMAL LOW (ref >60)
GFR, EST NON AFRICAN AMERICAN: 44 mL/min — AB (ref >60)
Glucose, Bld: 188 mg/dL — ABNORMAL HIGH (ref 65–99)
Potassium: 4.2 mmol/L (ref 3.5–5.1)
Sodium: 136 mmol/L (ref 135–145)
Total Bilirubin: 0.5 mg/dL (ref 0.3–1.2)
Total Protein: 6.2 g/dL — ABNORMAL LOW (ref 6.5–8.1)

## 2015-07-11 LAB — TROPONIN I
TROPONIN I: 0.03 ng/mL (ref <0.031)
Troponin I: <0.03 ng/mL (ref <0.031)

## 2015-07-11 LAB — LIPID PANEL
Cholesterol: 215 mg/dL — ABNORMAL HIGH (ref 0–200)
HDL: 34 mg/dL — ABNORMAL LOW (ref >40)
LDL Cholesterol: 120 mg/dL — ABNORMAL HIGH (ref 0–99)
Total CHOL/HDL Ratio: 6.3 RATIO
Triglycerides: 307 mg/dL — ABNORMAL HIGH (ref <150)
VLDL: 61 mg/dL — ABNORMAL HIGH (ref 0–40)

## 2015-07-11 LAB — MAGNESIUM: MAGNESIUM: 1.7 mg/dL (ref 1.7–2.4)

## 2015-07-11 LAB — MRSA PCR SCREENING: MRSA BY PCR: NEGATIVE

## 2015-07-11 MED ORDER — ALBUTEROL SULFATE (2.5 MG/3ML) 0.083% IN NEBU: 2.5000 mg | INHALATION_SOLUTION | Freq: Four times a day (QID) | RESPIRATORY_TRACT | Status: DC | PRN

## 2015-07-11 MED ORDER — FLUOXETINE HCL 20 MG PO CAPS
20.0000 mg | ORAL_CAPSULE | Freq: Every day | ORAL | Status: DC
  Administered 2015-07-11 – 2015-07-13 (×3): 20 mg via ORAL
  Filled 2015-07-11 (×3): qty 1

## 2015-07-11 MED ORDER — METOPROLOL TARTRATE 12.5 MG HALF TABLET
12.5000 mg | ORAL_TABLET | Freq: Two times a day (BID) | ORAL | Status: DC
  Administered 2015-07-11 – 2015-07-12 (×3): 12.5 mg via ORAL
  Filled 2015-07-11 (×3): qty 1

## 2015-07-11 MED ORDER — LEVOTHYROXINE SODIUM 50 MCG PO TABS
50.0000 ug | ORAL_TABLET | Freq: Every day | ORAL | Status: DC
  Administered 2015-07-11 – 2015-07-13 (×3): 50 ug via ORAL
  Filled 2015-07-11 (×3): qty 1

## 2015-07-11 MED ORDER — ACETAMINOPHEN 325 MG PO TABS
650.0000 mg | ORAL_TABLET | ORAL | Status: DC | PRN
  Administered 2015-07-11: 650 mg via ORAL
  Filled 2015-07-11: qty 2

## 2015-07-11 MED ORDER — NITROGLYCERIN 0.4 MG SL SUBL: 0.4000 mg | SUBLINGUAL_TABLET | SUBLINGUAL | Status: DC | PRN

## 2015-07-11 MED ORDER — ONDANSETRON HCL 4 MG/2ML IJ SOLN: 4.0000 mg | Freq: Four times a day (QID) | INTRAMUSCULAR | Status: DC | PRN

## 2015-07-11 MED ORDER — INFLUENZA VAC SPLIT QUAD 0.5 ML IM SUSY
0.5000 mL | PREFILLED_SYRINGE | INTRAMUSCULAR | Status: AC
  Administered 2015-07-13: 0.5 mL via INTRAMUSCULAR
  Filled 2015-07-11 (×2): qty 0.5

## 2015-07-11 MED ORDER — FE FUMARATE-B12-VIT C-FA-IFC PO CAPS
1.0000 | ORAL_CAPSULE | Freq: Every day | ORAL | Status: DC
  Administered 2015-07-11 – 2015-07-13 (×3): 1 via ORAL
  Filled 2015-07-11 (×5): qty 1

## 2015-07-11 MED ORDER — PREGABALIN 50 MG PO CAPS
150.0000 mg | ORAL_CAPSULE | Freq: Two times a day (BID) | ORAL | Status: DC
  Administered 2015-07-11 (×2): 50 mg via ORAL
  Administered 2015-07-12 (×2): 150 mg via ORAL
  Filled 2015-07-11 (×5): qty 3

## 2015-07-11 MED ORDER — ATORVASTATIN CALCIUM 40 MG PO TABS
40.0000 mg | ORAL_TABLET | Freq: Every day | ORAL | Status: DC
  Administered 2015-07-11 – 2015-07-12 (×2): 40 mg via ORAL
  Filled 2015-07-11 (×2): qty 1

## 2015-07-11 MED ORDER — INTEGRA 62.5-62.5-40-3 MG PO CAPS: 1.0000 | ORAL_CAPSULE | Freq: Every day | ORAL | Status: DC

## 2015-07-11 MED ORDER — PANTOPRAZOLE SODIUM 40 MG PO TBEC
40.0000 mg | DELAYED_RELEASE_TABLET | Freq: Every day | ORAL | Status: DC
  Administered 2015-07-11: 40 mg via ORAL
  Filled 2015-07-11: qty 1

## 2015-07-11 MED ORDER — PREGABALIN 50 MG PO CAPS
50.0000 mg | ORAL_CAPSULE | Freq: Once | ORAL | Status: AC
  Administered 2015-07-11: 50 mg via ORAL

## 2015-07-11 MED ORDER — AMITRIPTYLINE HCL 25 MG PO TABS
50.0000 mg | ORAL_TABLET | Freq: Every day | ORAL | Status: DC
  Administered 2015-07-11 – 2015-07-12 (×3): 50 mg via ORAL
  Filled 2015-07-11 (×3): qty 1
  Filled 2015-07-11: qty 2
  Filled 2015-07-11: qty 1

## 2015-07-11 MED ORDER — PANTOPRAZOLE SODIUM 40 MG PO TBEC
40.0000 mg | DELAYED_RELEASE_TABLET | Freq: Two times a day (BID) | ORAL | Status: DC
  Administered 2015-07-11 – 2015-07-13 (×4): 40 mg via ORAL
  Filled 2015-07-11 (×4): qty 1

## 2015-07-11 MED ORDER — INSULIN DETEMIR 100 UNIT/ML ~~LOC~~ SOLN
28.0000 [IU] | Freq: Every day | SUBCUTANEOUS | Status: DC
  Administered 2015-07-11 – 2015-07-12 (×2): 28 [IU] via SUBCUTANEOUS
  Filled 2015-07-11 (×4): qty 0.28

## 2015-07-11 MED ORDER — ALBUTEROL SULFATE (2.5 MG/3ML) 0.083% IN NEBU
2.5000 mg | INHALATION_SOLUTION | Freq: Four times a day (QID) | RESPIRATORY_TRACT | Status: DC
  Administered 2015-07-11 (×2): 2.5 mg via RESPIRATORY_TRACT
  Filled 2015-07-11 (×3): qty 3

## 2015-07-11 MED ORDER — APIXABAN 2.5 MG PO TABS
2.5000 mg | ORAL_TABLET | Freq: Two times a day (BID) | ORAL | Status: DC
  Administered 2015-07-11 – 2015-07-13 (×6): 2.5 mg via ORAL
  Filled 2015-07-11 (×6): qty 1

## 2015-07-11 MED ORDER — INSULIN DETEMIR 100 UNIT/ML FLEXPEN: 28.0000 [IU] | PEN_INJECTOR | Freq: Every day | SUBCUTANEOUS | Status: DC

## 2015-07-11 NOTE — Care Management Note (Signed)
Case Management Note  Patient Details  Name: Melissa [name] MRN: [id number] Date of Birth: 1928-03-05  Subjective/Objective:           Adm w at fib         Action/Plan: lives w fam   Expected Discharge Date:                  Expected Discharge Plan:     In-House Referral:     Discharge planning Services     Post Acute Care Choice:    Choice offered to:     DME Arranged:    DME Agency:     HH Arranged:    [place] Agency:     Status of Service:     Medicare Important Message Given:    Date Medicare IM Given:    Medicare IM give by:    Date Additional Medicare IM Given:    Additional Medicare Important Message give by:     If discussed at [place] of Stay Meetings, dates discussed:    Additional Comments: ur review done  Lacretia Leigh, RN 07/11/2015, 7:41 AM

## 2015-07-11 NOTE — Progress Notes (Signed)
[place] TEAM 1 - Stepdown/ICU TEAM Progress Note  Melissa [name] [id number] DOB: [birth year] DOA: 07/10/2015 PCP: [name], MD  Admit HPI / Brief Narrative: 81 y/o ? PMHx Anxiety, Chronic Systolic CHF (Q000111Q EF [id number] test 11/08/11 low risk, Paroxysmal A. fib documented/2016 on event monitor-Chad score above 4, currently on apixaban 2.5 BID, OSA, Near Syncope secondary to orthostasis, Sees cardiologist [name], CKD stage III, HLD, HTN, DM Type 2, Prior TIA and concussion with dizziness for the past couple of years   Presented with 1 day history of chest discomfort as well as shortness of breath pain was coming and going and she took some breathing treatments which did not seem to help. It got worse during the day To her awake at night. She usually uses 2 pillows to sleep but had to use for and ultimately decided to sleep in the recliner outside as it was cooler than she felt less smothered. She describes the pain in her chest as being "loaded" and a "heaviness" She tells me she felt this before 2012 She takes her meds regularly and complains of a persistent cough that she's had since December last her on and off with some sputum. She's not had a fever any chills At the same time as having the pain in her chest she felt like her heart was racing and this is why she went to see the urgent care center She states that the pain is unrelieved in the emergency room but is better now after resting.  She is reasonably high functioning at home but states that when she walks for too long a distance she feels winded Tells me that her granddaughter sets up her meds and does not know for sure what medication she is on  Last saw [name] last week and Hr was about 110 in that office visit   Workup in the emergency room BUN/creatinine 13/1.2 Baseline about the same Chest x-ray two-view no acute cardiopulmonary distress EKG showed atrial fibrillation rate about 110  QRS axis 25 no ST-T wave changes   HPI/Subjective: 9/27 A/O x4 states had a dry cough for over a year. On maximum PPI without resolution. States has never had official spirometry to differentiate between asthma vs COPD (previous heavy smoker stopped approximately 35 years ago). States uses Xopenex nebulizer at home PRN, and has never had atrial fibrillation in the past.  Assessment/Plan: Paroxysmal a-fib  -Chads score above 4 on Eliquis  -Placed on Cardizem drip to titrate -Given 0.5 mg nitroglycerin under the tongue which relieved her pain in the chest by 50% -Discussed with cardiology [name] who think that this may be tachyarrhythmia related-Recommends may be holding off of nitroglycerin and controlling the rate  -Troponins 3 negative  -Patient given aspirin 325 in the emergency room by me  -Metoprolol 12.5 mg BID  -Cardiology to evaluate patient formally in the a.m.  -Continue Apixaban 2.5 twice a day   Cardiomyopathy -Echocardiogram pending   Asthma vs COPD -Spirometry pending -CT chest pending (secondary to 1 year chronic dry cough unrelieved by conservative treatment)  Chronic kidney disease, stage III (moderate) -Hold on diarrhetic supplements  Prior TIA and dizziness with persistent head shaking -Outpatient follow-up with Dr. Rexene Alberts of neurology -Recommend screening for either Parkinson's or essential tremor treatment which can be helped with beta blocker  Hyperlipidemia -Not within ADA/NCEP guidelines increase Lipitor to 40 mg daily. Would titrate up to 80 mg over time  Code Status: DO NOT RESUSCITATE Family Communication:  family present at time of exam Disposition Plan: Resolution A. fib/chronic cough    Consultants: [name] (cardiology)   Procedure/Significant Events:    Culture   Antibiotics:   DVT prophylaxis: Apixaban    Devices   LINES / TUBES:      Continuous Infusions: . diltiazem (CARDIZEM) infusion Stopped (07/11/15  0330)    Objective: VITAL SIGNS: Temp: 98.4 F (36.9 C) (09/27 0719) Temp Source: Oral (09/27 0719) BP: 107/51 mmHg (09/27 0719) Pulse Rate: 106 (09/27 0719) SPO2; FIO2:   Intake/Output Summary (Last 24 hours) at 07/11/15 0851 Last data filed at 07/11/15 0330  Gross per 24 hour  Intake  17.75 ml  Output      0 ml  Net  17.75 ml     Exam: General:  A/O x4 states had a dry cough for over a year, No acute respiratory distress Eyes: Negative headache, eye pain, double vision,negative scleral hemorrhage ENT: Negative Runny nose, negative ear pain, negative gingival bleeding, Neck:  Negative scars, masses, torticollis, lymphadenopathy, JVD Lungs: Clear to auscultation bilaterally without wheezes or crackles Cardiovascular: Irregular irregular rhythm and rate without murmur gallop or rub normal S1 and S2 Abdomen:negative abdominal pain, nondistended, positive soft, bowel sounds, no rebound, no ascites, no appreciable mass Extremities: No significant cyanosis, clubbing, or edema bilateral lower extremities Psychiatric:  Negative depression, negative anxiety, negative fatigue, negative mania  Neurologic:  Cranial nerves II through XII intact, tongue/uvula midline, all extremities muscle strength 5/5, sensation intact throughout, finger nose finger  negative dysarthria, negative expressive aphasia, negative receptive aphasia.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 No results for input(s): LIPASE, AMYLASE in the last 168 hours. No results for input(s): AMMONIA in the last 168 hours. CBC:  Recent Labs Lab 07/10/15 2013  WBC 7.4  NEUTROABS 4.5  HGB 11.2*  HCT 35.6*  MCV 85.4  PLT 256   Cardiac  Enzymes:  Recent Labs Lab 07/10/15 2013 07/11/15 0132  TROPONINI <0.03 0.03   BNP (last 3 results) No results for input(s): BNP in the last 8760 hours.  ProBNP (last 3 results) No results for input(s): PROBNP in the last 8760 hours.  CBG: No results for input(s): GLUCAP in the last 168 hours.  Recent Results (from the past 240 hour(s))  MRSA PCR Screening     Status: None   Collection Time: 07/11/15 12:32 AM  Result Value Ref Range Status   MRSA by PCR NEGATIVE NEGATIVE Final    Comment:        The GeneXpert MRSA Assay (FDA approved for NASAL specimens only), is one component of a comprehensive MRSA colonization surveillance program. It is not intended to diagnose MRSA infection nor to guide or monitor treatment for MRSA infections.      Studies:  Recent x-ray studies have been reviewed in detail by the Attending Physician  Scheduled Meds:  Scheduled Meds: . albuterol  2.5 mg Nebulization Q6H  . amitriptyline  50 mg Oral QHS  . apixaban  2.5 mg Oral BID  . ferrous Q000111Q C-folic acid  1 capsule Oral Q breakfast  . FLUoxetine  20 mg Oral Daily  . [START ON 07/12/2015] Influenza vac split quadrivalent  PF  0.5 mL Intramuscular Tomorrow-1000  . insulin detemir  28 Units Subcutaneous QHS  . levothyroxine  50 mcg Oral QAC breakfast  . nitroGLYCERIN  0.5 inch Topical 3 times per day  . pantoprazole  40 mg Oral Daily  . pregabalin  150 mg Oral BID    Time spent on care of this patient: 40 mins   Vivan Vanderveer, Geraldo Docker , MD  [facility] Office  [phone] Pager [phone]  On-Call/Text Page:      [email or web address]      password [email or web address]  If 7PM-7AM, please contact night-coverage [email or web address] Password [facility] 07/11/2015, 8:51 AM   LOS: 1 day   Care during the described time interval was provided by me .  I have reviewed this patient's available data, including medical history, events of note, physical examination, and all test results as part of my  evaluation. I have personally reviewed and interpreted all radiology studies.   Dia Crawford, MD [phone] Pager

## 2015-07-12 ENCOUNTER — Inpatient Hospital Stay (HOSPITAL_COMMUNITY): Payer: Medicare Other

## 2015-07-12 DIAGNOSIS — R079 Chest pain, unspecified: Secondary | ICD-10-CM

## 2015-07-12 LAB — SPIROMETRY WITH GRAPH
FEF 25-75 PRE: 0.87 L/s
FEF 25-75 Post: 1.15 L/sec
FEF2575-%CHANGE-POST: 32 %
FEF2575-%PRED-POST: 96 %
FEF2575-%PRED-PRE: 72 %
FEV1-%Change-Post: 6 %
FEV1-%PRED-POST: 74 %
FEV1-%Pred-Pre: 69 %
FEV1-POST: 1.38 L
FEV1-PRE: 1.29 L
FEV1FVC-%CHANGE-POST: 0 %
FEV1FVC-%Pred-Pre: 99 %
FEV6-%CHANGE-POST: 7 %
FEV6-%Pred-Post: 80 %
FEV6-%Pred-Pre: 74 %
FEV6-Post: 1.88 L
FEV6-Pre: 1.76 L
FEV6FVC-%Change-Post: 0 %
FEV6FVC-%Pred-Post: 106 %
FEV6FVC-%Pred-Pre: 105 %
FVC-%Change-Post: 6 %
FVC-%Pred-Post: 75 %
FVC-%Pred-Pre: 70 %
FVC-Post: 1.89 L
FVC-Pre: 1.77 L
POST FEV1/FVC RATIO: 73 %
POST FEV6/FVC RATIO: 100 %
PRE FEV1/FVC RATIO: 73 %
Pre FEV6/FVC Ratio: 99 %

## 2015-07-12 LAB — HEMOGLOBIN A1C
HEMOGLOBIN A1C: 7 % — AB (ref 4.8–5.6)
MEAN PLASMA GLUCOSE: 154 mg/dL

## 2015-07-12 LAB — GLUCOSE, CAPILLARY: GLUCOSE-CAPILLARY: 177 mg/dL — AB (ref 65–99)

## 2015-07-12 MED ORDER — LEVALBUTEROL HCL 0.63 MG/3ML IN NEBU: 0.6300 mg | INHALATION_SOLUTION | RESPIRATORY_TRACT | Status: DC | PRN

## 2015-07-12 MED ORDER — MAGNESIUM SULFATE 2 GM/50ML IV SOLN
2.0000 g | Freq: Once | INTRAVENOUS | Status: AC
  Administered 2015-07-12: 2 g via INTRAVENOUS
  Filled 2015-07-12: qty 50

## 2015-07-12 MED ORDER — AMOXICILLIN-POT CLAVULANATE 500-125 MG PO TABS
1.0000 | ORAL_TABLET | Freq: Every day | ORAL | Status: DC
  Filled 2015-07-12: qty 1

## 2015-07-12 MED ORDER — ALBUTEROL SULFATE (2.5 MG/3ML) 0.083% IN NEBU
2.5000 mg | INHALATION_SOLUTION | Freq: Once | RESPIRATORY_TRACT | Status: AC
  Administered 2015-07-12: 2.5 mg via RESPIRATORY_TRACT

## 2015-07-12 NOTE — Progress Notes (Signed)
[place] TEAM 1 - Stepdown/ICU TEAM PROGRESS NOTE  Melissa [name] [id number] DOB: [birth year] DOA: 07/10/2015 PCP: [name], MD  Admit HPI / Brief Narrative: 81 y/o F Hx Anxiety, Chronic Systolic CHF (Q000111Q EF Q000111Q), Stress test 11/08/11 low risk, Paroxysmal A. fib documented 2016 on event monitor on apixaban 2.5 BID, OSA, CKD stage III, HLD, HTN, DM Type 2, and prior TIA who presented with 1 day history of chest discomfort and shortness of breath.  In the ED EKG showed atrial fibrillation w/ a rate about 110 w/ no ST-T wave changes.  HPI/Subjective: Chest pain has resolved.  No sob at rest.  Has not yet mobilized significantly.  Denies ha, n/v, abdom pain, or palpitations.    Assessment/Plan:  Paroxysmal a-fib w/ acute RVR  -Chads score above 4 - cont Eliquis  -now off cardizem gtt - resume home dose BB -Troponins 3 negative  -watch HR w/ ambulation - PT/OT consulted   Chronic systolic CHF  -Q000111Q EF Q000111Q - repeat TTE pending - no evidence of significant volume overload on exam   Chronic Asthma vs COPD w/o acute flair  -Spirometry pending -CT chest w/o acute findings -no clinical evidence of acute flair at present   Chronic kidney disease, stage 3 -crt 1.25 at admit and improved to 1.11 - stable   Prior TIA - persistent dizziness with persistent head shaking -Outpatient follow-up with Dr. Rexene Alberts of Neurology  DM2 -A1c 7.0 - CBG reasonably controlled   Hyperlipidemia -LDL 120 - increased Lipitor to 40 mg daily  Scattered pulmonary nodules A chronic issue - stable in size compared to 5 years ago - no further eval indicated  Normocytic anemia  No evidence of blood loss - likely related to CKD - consider outpt GI eval, though given age opting to not eval would also be reasonable   Code Status: NO CODE  Family Communication: spoke w/ grandson-in-law at bedside  Disposition Plan: transfer to tele - PT/OT evals - f/u TTE - f/u spirometry - ?home ~48hrs    Consultants: None   Procedures: 9/28 - TTE  9/27 - spirometry   Antibiotics: none  DVT prophylaxis: apixaban  Objective: Blood pressure 132/63, pulse 100, temperature 98 F (36.7 C), temperature source Oral, resp. rate 16, height 5\' 5"  (1.651 m), weight 75.3 kg (166 lb 0.1 oz), SpO2 95 %.  Intake/Output Summary (Last 24 hours) at 07/12/15 0924 Last data filed at 07/12/15 0400  Gross per 24 hour  Intake    720 ml  Output    300 ml  Net    420 ml   Exam: General: No acute respiratory distress Lungs: Clear to auscultation bilaterally without wheezes or crackles Cardiovascular: Regular rate and rhythm at the time of my exam without murmur gallop or rub normal S1 and S2 Abdomen: Nontender, nondistended, soft, bowel sounds positive, no rebound, no ascites, no appreciable mass Extremities: No significant cyanosis, clubbing, or edema bilateral lower extremities  Data Reviewed: Basic Metabolic Panel:  Recent Labs Lab 07/10/15 2013 07/11/15 1015  NA 138 136  K 3.8 4.2  CL 105 102  CO2 26 27  GLUCOSE 168* 188*  BUN 13 11  CREATININE 1.25* 1.11*  CALCIUM 8.8* 8.9  MG  --  1.7    CBC:  Recent Labs Lab 07/10/15 2013 07/11/15 1015  WBC 7.4 7.0  NEUTROABS 4.5 3.1  HGB 11.2* 11.6*  HCT 35.6* 36.5  MCV 85.4 86.9  PLT 256 233    Liver Function Tests:  Recent Labs Lab 07/11/15 1015  AST 19  ALT 11*  ALKPHOS 55  BILITOT 0.5  PROT 6.2*  ALBUMIN 3.3*   Cardiac Enzymes:  Recent Labs Lab 07/10/15 2013 07/11/15 0132 07/11/15 1210 07/11/15 2012  TROPONINI <0.03 0.03 <0.03 <0.03    CBG:  Recent Labs Lab 07/11/15 0135 07/11/15 1621  GLUCAP 119* 177*    Recent Results (from the past 240 hour(s))  MRSA PCR Screening     Status: None   Collection Time: 07/11/15 12:32 AM  Result Value Ref Range Status   MRSA by PCR NEGATIVE NEGATIVE Final    Comment:        The GeneXpert MRSA Assay (FDA approved for NASAL specimens only), is one component of  a comprehensive MRSA colonization surveillance program. It is not intended to diagnose MRSA infection nor to guide or monitor treatment for MRSA infections.      Studies:   Recent x-ray studies have been reviewed in detail by the Attending Physician  Scheduled Meds:  Scheduled Meds: . amitriptyline  50 mg Oral QHS  . amoxicillin-clavulanate  1 tablet Oral Daily  . apixaban  2.5 mg Oral BID  . atorvastatin  40 mg Oral q1800  . ferrous Q000111Q C-folic acid  1 capsule Oral Q breakfast  . FLUoxetine  20 mg Oral Daily  . Influenza vac split quadrivalent PF  0.5 mL Intramuscular Tomorrow-1000  . insulin detemir  28 Units Subcutaneous QHS  . levothyroxine  50 mcg Oral QAC breakfast  . magnesium sulfate 1 - 4 g bolus IVPB  2 g Intravenous Once  . metoprolol tartrate  12.5 mg Oral BID  . nitroGLYCERIN  0.5 inch Topical 3 times per day  . pantoprazole  40 mg Oral BID  . pregabalin  150 mg Oral BID    Time spent on care of this patient: 35 mins   MCCLUNG,JEFFREY T , MD   [facility] Office  [phone] Pager - Text Page per Shea Evans as per below:  On-Call/Text Page:      [email or web address]      password [email or web address]  If 7PM-7AM, please contact night-coverage [email or web address] Password [email or web address] 07/12/2015, 9:24 AM   LOS: 2 days

## 2015-07-12 NOTE — Progress Notes (Signed)
  Echocardiogram 2D Echocardiogram has been performed.  Jennette Dubin 07/12/2015, 8:20 AM

## 2015-07-13 DIAGNOSIS — I340 Nonrheumatic mitral (valve) insufficiency: Secondary | ICD-10-CM

## 2015-07-13 DIAGNOSIS — I480 Paroxysmal atrial fibrillation: Secondary | ICD-10-CM | POA: Diagnosis not present

## 2015-07-13 DIAGNOSIS — I5022 Chronic systolic (congestive) heart failure: Secondary | ICD-10-CM

## 2015-07-13 DIAGNOSIS — I10 Essential (primary) hypertension: Secondary | ICD-10-CM

## 2015-07-13 DIAGNOSIS — I059 Rheumatic mitral valve disease, unspecified: Secondary | ICD-10-CM

## 2015-07-13 DIAGNOSIS — N183 Chronic kidney disease, stage 3 (moderate): Secondary | ICD-10-CM

## 2015-07-13 LAB — CBC
HCT: 35.2 % — ABNORMAL LOW (ref 36.0–46.0)
Hemoglobin: 11.2 g/dL — ABNORMAL LOW (ref 12.0–15.0)
MCH: 27.3 pg (ref 26.0–34.0)
MCHC: 31.8 g/dL (ref 30.0–36.0)
MCV: 85.6 fL (ref 78.0–100.0)
PLATELETS: 249 10*3/uL (ref 150–400)
RBC: 4.11 MIL/uL (ref 3.87–5.11)
RDW: 14 % (ref 11.5–15.5)
WBC: 6.9 10*3/uL (ref 4.0–10.5)

## 2015-07-13 LAB — COMPREHENSIVE METABOLIC PANEL
ALBUMIN: 3.2 g/dL — AB (ref 3.5–5.0)
ALT: 13 U/L — ABNORMAL LOW (ref 14–54)
AST: 15 U/L (ref 15–41)
Alkaline Phosphatase: 54 U/L (ref 38–126)
Anion gap: 8 (ref 5–15)
BUN: 18 mg/dL (ref 6–20)
CHLORIDE: 99 mmol/L — AB (ref 101–111)
CO2: 27 mmol/L (ref 22–32)
Calcium: 8.9 mg/dL (ref 8.9–10.3)
Creatinine, Ser: 1.2 mg/dL — ABNORMAL HIGH (ref 0.44–1.00)
GFR calc Af Amer: 46 mL/min — ABNORMAL LOW (ref >60)
GFR calc non Af Amer: 40 mL/min — ABNORMAL LOW (ref >60)
GLUCOSE: 136 mg/dL — AB (ref 65–99)
POTASSIUM: 4.3 mmol/L (ref 3.5–5.1)
SODIUM: 134 mmol/L — AB (ref 135–145)
Total Bilirubin: 0.5 mg/dL (ref 0.3–1.2)
Total Protein: 6.2 g/dL — ABNORMAL LOW (ref 6.5–8.1)

## 2015-07-13 MED ORDER — ATORVASTATIN CALCIUM 40 MG PO TABS: 40.0000 mg | ORAL_TABLET | Freq: Every day | ORAL | Status: DC

## 2015-07-13 MED ORDER — PREGABALIN 50 MG PO CAPS
50.0000 mg | ORAL_CAPSULE | Freq: Two times a day (BID) | ORAL | Status: DC
  Administered 2015-07-13: 50 mg via ORAL
  Filled 2015-07-13: qty 1

## 2015-07-13 MED ORDER — METOPROLOL SUCCINATE ER 25 MG PO TB24
25.0000 mg | ORAL_TABLET | Freq: Every day | ORAL | Status: DC
  Administered 2015-07-13: 25 mg via ORAL
  Filled 2015-07-13: qty 1

## 2015-07-13 MED ORDER — PREGABALIN 50 MG PO CAPS: ORAL_CAPSULE | ORAL | Status: DC

## 2015-07-13 MED ORDER — LOSARTAN POTASSIUM 25 MG PO TABS
25.0000 mg | ORAL_TABLET | Freq: Every day | ORAL | Status: DC
  Administered 2015-07-13: 25 mg via ORAL
  Filled 2015-07-13: qty 1

## 2015-07-13 MED ORDER — LOSARTAN POTASSIUM 25 MG PO TABS
25.0000 mg | ORAL_TABLET | Freq: Every day | ORAL | Status: DC
Start: 2015-07-13 — End: 2015-08-04

## 2015-07-13 NOTE — Discharge Instructions (Signed)
Atrial Fibrillation °Atrial fibrillation is a type of irregular heart rhythm (arrhythmia). During atrial fibrillation, the upper chambers of the heart (atria) quiver continuously in a chaotic pattern. This causes an irregular and often rapid heart rate.  °Atrial fibrillation is the result of the heart becoming overloaded with disorganized signals that tell it to beat. These signals are normally released one at a time by a part of the right atrium called the sinoatrial node. They then travel from the atria to the lower chambers of the heart (ventricles), causing the atria and ventricles to contract and pump blood as they pass. In atrial fibrillation, parts of the atria outside of the sinoatrial node also release these signals. This results in two problems. First, the atria receive so many signals that they do not have time to fully contract. Second, the ventricles, which can only receive one signal at a time, beat irregularly and out of rhythm with the atria.  °There are three types of atrial fibrillation:  °· Paroxysmal. Paroxysmal atrial fibrillation starts suddenly and stops on its own within a week. °· Persistent. Persistent atrial fibrillation lasts for more than a week. It may stop on its own or with treatment. °· Permanent. Permanent atrial fibrillation does not go away. Episodes of atrial fibrillation may lead to permanent atrial fibrillation. °Atrial fibrillation can prevent your heart from pumping blood normally. It increases your risk of stroke and can lead to heart failure.  °CAUSES  °· Heart conditions, including a heart attack, heart failure, coronary artery disease, and heart valve conditions.   °· Inflammation of the sac that surrounds the heart (pericarditis). °· Blockage of an artery in the lungs (pulmonary embolism). °· Pneumonia or other infections. °· Chronic lung disease. °· Thyroid problems, especially if the thyroid is overactive (hyperthyroidism). °· Caffeine, excessive alcohol use, and use  of some illegal drugs.   °· Use of some medicines, including certain decongestants and diet pills. °· Heart surgery.   °· Birth defects.   °Sometimes, no cause can be found. When this happens, the atrial fibrillation is called lone atrial fibrillation. The risk of complications from atrial fibrillation increases if you have lone atrial fibrillation and you are age 60 years or older. °RISK FACTORS °· Heart failure. °· Coronary artery disease. °· Diabetes mellitus.   °· High blood pressure (hypertension).   °· Obesity.   °· Other arrhythmias.   °· Increased age. °SIGNS AND SYMPTOMS  °· A feeling that your heart is beating rapidly or irregularly.   °· A feeling of discomfort or pain in your chest.   °· Shortness of breath.   °· Sudden light-headedness or weakness.   °· Getting tired easily when exercising.   °· Urinating more often than normal (mainly when atrial fibrillation first begins).   °In paroxysmal atrial fibrillation, symptoms may start and suddenly stop. °DIAGNOSIS  °Your health care provider may be able to detect atrial fibrillation when taking your pulse. Your health care provider may have you take a test called an ambulatory electrocardiogram (ECG). An ECG records your heartbeat patterns over a 24-hour period. You may also have other tests, such as: °· Transthoracic echocardiogram (TTE). During echocardiography, sound waves are used to evaluate how blood flows through your heart. °· Transesophageal echocardiogram (TEE). °· Stress test. There is more than one type of stress test. If a stress test is needed, ask your health care provider about which type is best for you. °· Chest X-ray exam. °· Blood tests. °· Computed tomography (CT). °TREATMENT  °Treatment may include: °· Treating any underlying conditions. For example, if you   have an overactive thyroid, treating the condition may correct atrial fibrillation.  Taking medicine. Medicines may be given to control a rapid heart rate or to prevent blood  clots, heart failure, or a stroke.  Having a procedure to correct the rhythm of the heart:  Electrical cardioversion. During electrical cardioversion, a controlled, low-energy shock is delivered to the heart through your skin. If you have chest pain, very low blood pressure, or sudden heart failure, this procedure may need to be done as an emergency.  Catheter ablation. During this procedure, heart tissues that send the signals that cause atrial fibrillation are destroyed.  Surgical ablation. During this surgery, thin lines of heart tissue that carry the abnormal signals are destroyed. This procedure can either be an open-heart surgery or a minimally invasive surgery. With the minimally invasive surgery, small cuts are made to access the heart instead of a large opening.  Pulmonary venous isolation. During this surgery, tissue around the veins that carry blood from the lungs (pulmonary veins) is destroyed. This tissue is thought to carry the abnormal signals. HOME CARE INSTRUCTIONS   Take medicines only as directed by your health care provider. Some medicines can make atrial fibrillation worse or recur.  If blood thinners were prescribed by your health care provider, take them exactly as directed. Too much blood-thinning medicine can cause bleeding. If you take too little, you will not have the needed protection against stroke and other problems.  Perform blood tests at home if directed by your health care provider. Perform blood tests exactly as directed.  Quit smoking if you smoke.  Do not drink alcohol.  Do not drink caffeinated beverages such as coffee, soda, and some teas. You may drink decaffeinated coffee, soda, or tea.   Maintain a healthy weight.Do not use diet pills unless your health care provider approves. They may make heart problems worse.   Follow diet instructions as directed by your health care provider.  Exercise regularly as directed by your health care  provider.  Keep all follow-up visits as directed by your health care provider. This is important. PREVENTION  The following substances can cause atrial fibrillation to recur:   Caffeinated beverages.  Alcohol.  Certain medicines, especially those used for breathing problems.  Certain herbs and herbal medicines, such as those containing ephedra or ginseng.  Illegal drugs, such as cocaine and amphetamines. Sometimes medicines are given to prevent atrial fibrillation from recurring. Proper treatment of any underlying condition is also important in helping prevent recurrence.  SEEK MEDICAL CARE IF:  You notice a change in the rate, rhythm, or strength of your heartbeat.  You suddenly begin urinating more frequently.  You tire more easily when exerting yourself or exercising. SEEK IMMEDIATE MEDICAL CARE IF:   You have chest pain, abdominal pain, sweating, or weakness.  You feel nauseous.  You have shortness of breath.  You suddenly have swollen feet and ankles.  You feel dizzy.  Your face or limbs feel numb or weak.  You have a change in your vision or speech. MAKE SURE YOU:   Understand these instructions.  Will watch your condition.  Will get help right away if you are not doing well or get worse. Document Released: 09/30/2005 Document Revised: 02/14/2014 Document Reviewed: 11/10/2012 [facility] Patient Information 2015 [place], [place]. This information is not intended to replace advice given to you by your health care provider. Make sure you discuss any questions you have with your health care provider.  Information on my medicine -  ELIQUIS (apixaban)  This medication education was reviewed with me or my healthcare representative as part of my discharge preparation.   Why was Eliquis prescribed for you? Eliquis was prescribed for you to reduce the risk of a blood clot forming that can cause a stroke if you have a medical condition called atrial fibrillation (a type  of irregular heartbeat).  What do You need to know about Eliquis ? Take your Eliquis TWICE DAILY - one tablet in the morning and one tablet in the evening with or without food. If you have difficulty swallowing the tablet whole please discuss with your pharmacist how to take the medication safely.  Take Eliquis exactly as prescribed by your doctor and DO NOT stop taking Eliquis without talking to the doctor who prescribed the medication.  Stopping may increase your risk of developing a stroke.  Refill your prescription before you run out.  After discharge, you should have regular check-up appointments with your healthcare provider that is prescribing your Eliquis.  In the future your dose may need to be changed if your kidney function or weight changes by a significant amount or as you get older.  What do you do if you miss a dose? If you miss a dose, take it as soon as you remember on the same day and resume taking twice daily.  Do not take more than one dose of ELIQUIS at the same time to make up a missed dose.  Important Safety Information A possible side effect of Eliquis is bleeding. You should call your healthcare provider right away if you experience any of the following: ? Bleeding from an injury or your nose that does not stop. ? Unusual colored urine (red or dark brown) or unusual colored stools (red or black). ? Unusual bruising for unknown reasons. ? A serious fall or if you hit your head (even if there is no bleeding).  Some medicines may interact with Eliquis and might increase your risk of bleeding or clotting while on Eliquis. To help avoid this, consult your healthcare provider or pharmacist prior to using any new prescription or non-prescription medications, including herbals, vitamins, non-steroidal anti-inflammatory drugs (NSAIDs) and supplements.  This website has more information on Eliquis (apixaban): http://www.eliquis.com/eliquis/home

## 2015-07-13 NOTE — Progress Notes (Addendum)
PT Cancellation/Discharge Note  Patient Details Name: Melissa [name] MRN: [id number] DOB: 02-03-1928   Cancelled Treatment:    Reason Eval/Treat Not Completed: OT screened, no needs identified, will sign off.  OT evaluated and screened for PT needs.  No needs identified.  I spoke with pt and daughter who agreed that the pt has no physical therapy needs at this time.  PT to sign off.     Barbarann Ehlers [place], [place], DPT [phone]   07/13/2015, 12:13 PM

## 2015-07-13 NOTE — Discharge Summary (Signed)
Physician Discharge Summary  Melissa [name] [id number] DOB: 13-Nov-1927 DOA: 07/10/2015  PCP: [name], MD  Admit date: 07/10/2015 Discharge date: 07/13/2015  Time spent: 35 minutes  Recommendations for Outpatient Follow-up:  1. Follow up PFT results 2. BMP 1 week re Cr 3. Follow BP  Discharge Diagnoses:  Principal Problem:   Paroxysmal a-fib Active Problems:   HTN (hypertension)   Cardiomyopathy   Chronic kidney disease, stage III (moderate)   Chronic anticoagulation   A-fib   Discharge Condition: improved  Diet recommendation: cardiac  Filed Weights   07/10/15 2321  Weight: 75.3 kg (166 lb 0.1 oz)    History of present illness:  Presented with 1 day history of chest discomfort as well as shortness of breath pain was coming and going and she took some breathing treatments which did not seem to help. It got worse during the day To her awake at night. She usually uses 2 pillows to sleep but had to use for and ultimately decided to sleep in the recliner outside as it was cooler than she felt less smothered. She describes the pain in her chest as being "loaded" and a "heaviness" She tells me she felt this before 2012 She takes her meds regularly and complains of a persistent cough that she's had since December last her on and off with some sputum. She's not had a fever any chills At the same time as having the pain in her chest she felt like her heart was racing and this is why she went to see the urgent care center She states that the pain is unrelieved in the emergency room but is better now after resting.  Hospital Course:  Paroxysmal a-fib w/ acute RVR  -Chads score above 4 - cont Eliquis  -now off cardizem gtt - resume home dose BB -Troponins 3 negative  -watch HR w/ ambulation - PT/OT consulted   Chronic systolic CHF  -Q000111Q EF Q000111Q - repeat shows: systolic function has deteriorated and there are now distinct regional wall motion abnormalities.  Mitral insufficiency has worsened. - ask cards to see-- ARB started, toprol continued  Chronic Asthma vs COPD w/o acute flair  -Spirometry pending -CT chest w/o acute findings -no clinical evidence of acute flair at present   Chronic kidney disease, stage 3 -crt 1.25 at admit and improved to 1.11 - stable   Prior TIA - persistent dizziness with persistent head shaking -Outpatient follow-up with Dr. Rexene Alberts of Neurology  DM2 -A1c 7.0 - CBG reasonably controlled   Hyperlipidemia -LDL 120 - increased Lipitor to 40 mg daily  Scattered pulmonary nodules A chronic issue - stable in size compared to 5 years ago - no further eval indicated  Normocytic anemia  No evidence of blood loss - likely related to CKD - consider outpt GI eval, though given age opting to not eval would also be reasonable   Procedures:    Consultations:  cards  Discharge Exam: Filed Vitals:   07/13/15 1355  BP: 133/68  Pulse: 97  Temp: 98 F (36.7 C)  Resp: 18      Discharge Instructions   Discharge Instructions    Diet - low sodium heart healthy    Complete by:  As directed      Diet Carb Modified    Complete by:  As directed      Discharge instructions    Complete by:  As directed   BMp 1 week re Cr     Increase activity slowly  Complete by:  As directed           Current Discharge Medication List    START taking these medications   Details  losartan (COZAAR) 25 MG tablet Take 1 tablet (25 mg total) by mouth daily. Qty: 30 tablet, Refills: 0      CONTINUE these medications which have CHANGED   Details  atorvastatin (LIPITOR) 40 MG tablet Take 1 tablet (40 mg total) by mouth daily at 6 PM. Qty: 30 tablet, Refills: 0    pregabalin (LYRICA) 50 MG capsule Take up to 3 capsules daily as needed Qty: 270 capsule, Refills: 1   Associated Diagnoses: Diabetic radiculopathy      CONTINUE these medications which have NOT CHANGED   Details  acetaminophen (TYLENOL) 500 MG tablet Take  1,000 mg by mouth every 6 (six) hours as needed for mild pain.     amitriptyline (ELAVIL) 25 MG tablet Take 50 mg by mouth at bedtime.     apixaban (ELIQUIS) 2.5 MG TABS tablet Take 1 tablet (2.5 mg total) by mouth 2 (two) times daily. Qty: 180 tablet, Refills: 3    Cholecalciferol (VITAMIN D3) 1000 UNITS CAPS Take 1 capsule by mouth daily.    esomeprazole (NEXIUM) 40 MG capsule Take 40 mg by mouth 2 (two) times daily.      Fe Fum-FePoly-Vit C-Vit B3 (INTEGRA) 62.5-62.5-40-3 MG CAPS Take 1 capsule by mouth daily. Refills: 3    FLUoxetine (PROZAC) 20 MG capsule Take 20 mg by mouth daily.     Insulin Aspart (NOVOLOG [place]) Inject 4 Units into the skin 3 (three) times daily.     Insulin Detemir (LEVEMIR FLEXTOUCH) 100 UNIT/ML Pen INJECT 28 UNITS            SUBCUTANEOUSLY AT BEDTIME    levalbuterol (XOPENEX) 0.63 MG/3ML nebulizer solution Take 0.63 mg by nebulization every 6 (six) hours as needed for wheezing or shortness of breath.  Refills: 0    levothyroxine (SYNTHROID, LEVOTHROID) 50 MCG tablet TAKE 1 TABLET BY MOUTH EVERY DAY Qty: 30 tablet, Refills: 2    Liraglutide (VICTOZA) 18 MG/3ML SOPN Inject 1.2 mg into the skin daily. Qty: 6 pen, Refills: 5    metoprolol succinate (TOPROL XL) 25 MG 24 hr tablet Take 1 tablet (25 mg total) by mouth daily. Qty: 90 tablet, Refills: 3      STOP taking these medications     amoxicillin-clavulanate (AUGMENTIN) 500-125 MG per tablet        No Known Allergies    The results of significant diagnostics from this hospitalization (including imaging, microbiology, ancillary and laboratory) are listed below for reference.    Significant Diagnostic Studies: Dg Chest 2 View  07/10/2015   CLINICAL DATA:  Chest pain and chest pressure for 1 day  EXAM: CHEST  2 VIEW  COMPARISON:  September 24, 2011  FINDINGS: The heart size and mediastinal contours are within normal limits. There is no focal infiltrate, pulmonary edema, or pleural effusion. The  visualized skeletal structures are unremarkable.  IMPRESSION: No active cardiopulmonary disease.   Electronically Signed   By: Abelardo Diesel M.D.   On: 07/10/2015 20:23   Ct Chest Wo Contrast  07/11/2015   CLINICAL DATA:  81 year old female with persisting dry cough for a year. Heavy previous smoking history. Evaluate for neoplasm.  EXAM: CT CHEST WITHOUT CONTRAST  TECHNIQUE: Multidetector CT imaging of the chest was performed following the standard protocol without IV contrast.  COMPARISON:  Radiographs 1 day prior.  Chest CT 09/08/2010  FINDINGS: Mediastinum/Nodes: The heart is normal in size. There are scattered coronary artery calcifications. Small mediastinal lymph nodes, not enlarged by size criteria. There is atherosclerosis of the thoracic aorta which is normal in caliber. No pericardial effusion.  Lungs/Pleura: Scattered small pulmonary nodules, majority of which are unchanged from prior CT. Largest nodule in the left lower lobe measures 5 mm, axial image 45/71 with additional small nodules in the lower lobe more inferiorly. Tiny nodules in the left upper lobe and right lung apex. There is a tubular density in the anterior right upper lobe, 14 mm which is slightly more prominent on prior exam, however suggests possible mucous plugging. Faint subpleural reticulation and ground-glass opacity in the right lower lobe. No dominant pulmonary mass. There is no pleural effusion.  Upper abdomen: No acute abnormality. Gallbladder appears distended contains a dependent gallstone. Small hiatal hernia.  Musculoskeletal: There are no acute or suspicious osseous abnormalities.  IMPRESSION: 1. Scattered pulmonary nodules, the majority of which are unchanged from CT 5 years prior, and largest measuring 5 mm in the left lower lobe. There is a tubular opacity in the anterior right upper lobe suggestive of mucous plugging. Faint ground-glass opacities/reticulation in the right lower lobe, likely inflammatory or infectious  in etiology. No pulmonary mass or pulmonary nodule suspicious for malignancy. 2. Atherosclerosis, including coronary artery calcifications.   Electronically Signed   By: Jeb Levering M.D.   On: 07/11/2015 21:20    Microbiology: Recent Results (from the past 240 hour(s))  MRSA PCR Screening     Status: None   Collection Time: 07/11/15 12:32 AM  Result Value Ref Range Status   MRSA by PCR NEGATIVE NEGATIVE Final    Comment:        The GeneXpert MRSA Assay (FDA approved for NASAL specimens only), is one component of a comprehensive MRSA colonization surveillance program. It is not intended to diagnose MRSA infection nor to guide or monitor treatment for MRSA infections.      Labs: Basic Metabolic Panel:  Recent Labs Lab 07/10/15 2013 07/11/15 1015 07/13/15 0419  NA 138 136 134*  K 3.8 4.2 4.3  CL 105 102 99*  CO2 26 27 27   GLUCOSE 168* 188* 136*  BUN 13 11 18   CREATININE 1.25* 1.11* 1.20*  CALCIUM 8.8* 8.9 8.9  MG  --  1.7  --    Liver Function Tests:  Recent Labs Lab 07/11/15 1015 07/13/15 0419  AST 19 15  ALT 11* 13*  ALKPHOS 55 54  BILITOT 0.5 0.5  PROT 6.2* 6.2*  ALBUMIN 3.3* 3.2*   No results for input(s): LIPASE, AMYLASE in the last 168 hours. No results for input(s): AMMONIA in the last 168 hours. CBC:  Recent Labs Lab 07/10/15 2013 07/11/15 1015 07/13/15 0419  WBC 7.4 7.0 6.9  NEUTROABS 4.5 3.1  --   HGB 11.2* 11.6* 11.2*  HCT 35.6* 36.5 35.2*  MCV 85.4 86.9 85.6  PLT 256 233 249   Cardiac Enzymes:  Recent Labs Lab 07/10/15 2013 07/11/15 0132 07/11/15 1210 07/11/15 2012  TROPONINI <0.03 0.03 <0.03 <0.03   BNP: BNP (last 3 results) No results for input(s): BNP in the last 8760 hours.  ProBNP (last 3 results) No results for input(s): PROBNP in the last 8760 hours.  CBG:  Recent Labs Lab 07/11/15 0135 07/11/15 1621 07/12/15 1958  GLUCAP 119* 177* 177*       Signed:  Eliseo Squires, JESSICA  [facility] 07/13/2015, 2:00 PM

## 2015-07-13 NOTE — Progress Notes (Signed)
PROGRESS NOTE  Melissa [name] [id number] DOB: 01/10/1928 DOA: 07/10/2015 PCP: [name], MD  Admit HPI / Brief Narrative: 81 y/o F Hx Anxiety, Chronic Systolic CHF (Q000111Q EF Q000111Q), Stress test 11/08/11 low risk, Paroxysmal A. fib documented 2016 on event monitor on apixaban 2.5 BID, OSA, CKD stage III, HLD, HTN, DM Type 2, and prior TIA who presented with 1 day history of chest discomfort and shortness of breath.  In the ED EKG showed atrial fibrillation w/ a rate about 110 w/ no ST-T wave changes.  HPI/Subjective: Up walking around, no SOB, no CP  Assessment/Plan:  Paroxysmal a-fib w/ acute RVR  -Chads score above 4 - cont Eliquis  -now off cardizem gtt - resume home dose BB -Troponins 3 negative  -watch HR w/ ambulation - PT/OT consulted   Chronic systolic CHF  -Q000111Q EF Q000111Q - repeat shows:  systolic function has deteriorated and there are now distinct regional waal motion abnormalities. Mitral insufficiency has worsened. - ask cards to see  Chronic Asthma vs COPD w/o acute flair  -Spirometry pending -CT chest w/o acute findings -no clinical evidence of acute flair at present   Chronic kidney disease, stage 3 -crt 1.25 at admit and improved to 1.11 - stable   Prior TIA - persistent dizziness with persistent head shaking -Outpatient follow-up with Dr. Rexene Alberts of Neurology  DM2 -A1c 7.0 - CBG reasonably controlled   Hyperlipidemia -LDL 120 - increased Lipitor to 40 mg daily  Scattered pulmonary nodules A chronic issue - stable in size compared to 5 years ago - no further eval indicated  Normocytic anemia  No evidence of blood loss - likely related to CKD - consider outpt GI eval, though given age opting to not eval would also be reasonable   Code Status: NO CODE  Family Communication: daughter at bedside Disposition Plan: transfer to tele - PT/OT evals - f/u TTE - f/u spirometry -    Consultants: cards  Procedures: 9/28 - TTE  9/27 - spirometry    Antibiotics: none  DVT prophylaxis: apixaban  Objective: Blood pressure 127/93, pulse 98, temperature 97.6 F (36.4 C), temperature source Oral, resp. rate 18, height 5\' 5"  (1.651 m), weight 75.3 kg (166 lb 0.1 oz), SpO2 97 %.  Intake/Output Summary (Last 24 hours) at 07/13/15 0855 Last data filed at 07/12/15 1331  Gross per 24 hour  Intake    360 ml  Output      0 ml  Net    360 ml   Exam: General: No acute respiratory distress Lungs: Clear to auscultation bilaterally without wheezes or crackles Cardiovascular: Regular rate and rhythm at the time of my exam without murmur gallop or rub normal S1 and S2 Abdomen: Nontender, nondistended, soft, bowel sounds positive, no rebound, no ascites, no appreciable mass Extremities: No significant cyanosis, clubbing, or edema bilateral lower extremities  Data Reviewed: Basic Metabolic Panel:  Recent Labs Lab 07/10/15 2013 07/11/15 1015 07/13/15 0419  NA 138 136 134*  K 3.8 4.2 4.3  CL 105 102 99*  CO2 26 27 27   GLUCOSE 168* 188* 136*  BUN 13 11 18   CREATININE 1.25* 1.11* 1.20*  CALCIUM 8.8* 8.9 8.9  MG  --  1.7  --     CBC:  Recent Labs Lab 07/10/15 2013 07/11/15 1015 07/13/15 0419  WBC 7.4 7.0 6.9  NEUTROABS 4.5 3.1  --   HGB 11.2* 11.6* 11.2*  HCT 35.6* 36.5 35.2*  MCV 85.4 86.9 85.6  PLT 256 233  249    Liver Function Tests:  Recent Labs Lab 07/11/15 1015 07/13/15 0419  AST 19 15  ALT 11* 13*  ALKPHOS 55 54  BILITOT 0.5 0.5  PROT 6.2* 6.2*  ALBUMIN 3.3* 3.2*   Cardiac Enzymes:  Recent Labs Lab 07/10/15 2013 07/11/15 0132 07/11/15 1210 07/11/15 2012  TROPONINI <0.03 0.03 <0.03 <0.03    CBG:  Recent Labs Lab 07/11/15 0135 07/11/15 1621 07/12/15 1958  GLUCAP 119* 177* 177*    Recent Results (from the past 240 hour(s))  MRSA PCR Screening     Status: None   Collection Time: 07/11/15 12:32 AM  Result Value Ref Range Status   MRSA by PCR NEGATIVE NEGATIVE Final    Comment:         The GeneXpert MRSA Assay (FDA approved for NASAL specimens only), is one component of a comprehensive MRSA colonization surveillance program. It is not intended to diagnose MRSA infection nor to guide or monitor treatment for MRSA infections.        Scheduled Meds:  Scheduled Meds: . amitriptyline  50 mg Oral QHS  . apixaban  2.5 mg Oral BID  . atorvastatin  40 mg Oral q1800  . ferrous Q000111Q C-folic acid  1 capsule Oral Q breakfast  . FLUoxetine  20 mg Oral Daily  . Influenza vac split quadrivalent PF  0.5 mL Intramuscular Tomorrow-1000  . insulin detemir  28 Units Subcutaneous QHS  . levothyroxine  50 mcg Oral QAC breakfast  . metoprolol tartrate  12.5 mg Oral BID  . pantoprazole  40 mg Oral BID  . pregabalin  150 mg Oral BID    Time spent on care of this patient: 35 mins   Melissa [name] , DO   [facility] Office  [phone] Pager - Text Page per Shea Evans as per below:  On-Call/Text Page:      [email or web address]      password [email or web address]  If 7PM-7AM, please contact night-coverage [email or web address] Password [email or web address] 07/13/2015, 8:55 AM   LOS: 3 days

## 2015-07-13 NOTE — Evaluation (Signed)
Occupational Therapy Evaluation Patient Details Name: JALI COCKFIELD MRN: [id number] DOB: 02/11/1928 Today's Date: 07/13/2015    History of Present Illness  This 81 y.o. Female admitted with A-Fib with RVR.  PMH includes:  Chronic systolic HF, CAD, mitral regurgitation, DM    Clinical Impression   Patient evaluated by Occupational Therapy with no further acute OT needs identified. All education has been completed and the patient has no further questions. Pt is at baseline - she is independent with ADLs, and no LOB noted.  See below for any follow-up Occupational Therapy or equipment needs. OT is signing off. Thank you for this referral.      Follow Up Recommendations  No OT follow up    Equipment Recommendations  None recommended by OT    Recommendations for Other Services       Precautions / Restrictions Precautions Precautions: None      Mobility Bed Mobility Overal bed mobility: Independent                Transfers Overall transfer level: Independent Equipment used: None             General transfer comment: PT able to simulate shower in standing with not LOB.  Able to retrieve items from floor independently, access items overhead, etc.     Balance Overall balance assessment: Independent;No apparent balance deficits (not formally assessed)                                          ADL Overall ADL's : At baseline;Independent                                       General ADL Comments: Pt performed simulated shower in standing without LOB or dyspnea     Vision     Perception     Praxis      Pertinent Vitals/Pain Pain Assessment: No/denies pain     Hand Dominance Right   Extremity/Trunk Assessment Upper Extremity Assessment Upper Extremity Assessment: Overall WFL for tasks assessed   Lower Extremity Assessment Lower Extremity Assessment: Overall WFL for tasks assessed   Cervical / Trunk  Assessment Cervical / Trunk Assessment: Normal   Communication Communication Communication: No difficulties   Cognition Arousal/Alertness: Awake/alert Behavior During Therapy: WFL for tasks assessed/performed Overall Cognitive Status: Within Functional Limits for tasks assessed                     General Comments       Exercises       Shoulder Instructions      Home Living Family/patient expects to be discharged to:: Private residence Living Arrangements: Children Available Help at Discharge: Family;Available 24 hours/day Type of Home: House Home Access: Stairs to enter [employer] of Steps: 4 Entrance Stairs-Rails: Right;Left;Can reach both Home Layout: Laundry or work area in basement;Two level     Bathroom Shower/Tub: Occupational psychologist: Standard     Home Equipment: Grab bars - tub/shower;Shower seat          Prior Functioning/Environment Level of Independence: Independent        Comments: Pt drives, is independent in the community, cooks.  She denies h/o falls, and daughter confirms this     OT Diagnosis:  OT Problem List:     OT Treatment/Interventions:      OT Goals(Current goals can be found in the care plan section) Acute Rehab OT Goals OT Goal Formulation: All assessment and education complete, DC therapy  OT Frequency:     Barriers to D/C:            Co-evaluation              End of Session Nurse Communication: Mobility status  Activity Tolerance: Patient tolerated treatment well Patient left: in chair;with call bell/phone within reach;with family/visitor present   Time: [id number] OT Time Calculation (min): 19 min Charges:  OT General Charges $OT Visit: 1 Procedure OT Evaluation $Initial OT Evaluation Tier I: 1 Procedure G-Codes:    Lucille Passy M Jul 27, 2015, 12:04 PM

## 2015-07-13 NOTE — Care Management Important Message (Signed)
Important Message  Patient Details  Name: Melissa [name] MRN: [id number] Date of Birth: Oct 28, 1927   Medicare Important Message Given:  Yes-second notification given    Nathen May 07/13/2015, 12:25 PM

## 2015-07-13 NOTE — Consult Note (Signed)
CARDIOLOGY CONSULT NOTE   Patient ID: Melissa [name] MRN: [id number], DOB/AGE: [birth year]   Admit date: 07/10/2015 Date of Consult: 07/13/2015   Primary Physician: Melissa Coma, MD Primary Cardiologist: Dr. Marlou [name]  Pt. Profile  81 year old woman admitted with chest pain and dyspnea. History of PAF, chronic systolic LV dysfunction.  Problem List  Past Medical History  Diagnosis Date  . Asthma   . Sleep apnea   . Angina   . Hypertension   . Blood transfusion   . GERD (gastroesophageal reflux disease)   . Headache(784.0)   . Arthritis   . Anxiety   . Pneumonia   . Diabetes mellitus   . Anemia   . Hiatal hernia   . Acute kidney injury 09/26/2011  . High cholesterol   . Unspecified hypothyroidism 06/15/2013  . Cardiomyopathy 09/02/2013    EF 45%, 12/12    Past Surgical History  Procedure Laterality Date  . Abdominal hysterectomy    . Appendectomy    . Eye surgery    . Tonsillectomy    . Breast surgery       Allergies  No Known Allergies  HPI   This 81 year old woman is a cardiology patient of Melissa [name].  Her PCP is Dr. Stephanie [name] although she has not seen her in a while.  She last saw Dr. Marlou [name] in July 2016.  She has a past history of paroxysmal atrial fibrillation.  She had an event monitor in April 2016 which showed 34 minutes of atrial fibrillation with heart rates up to 166.  She was begun on Apixaban at that time.  The patient does not have any history of known ischemic heart disease.  She had a normal stress test in January 2013 which was low risk and showed no ischemia and showed a normal ejection fraction although prior echocardiograms in 2012 had shown a mildly reduced ejection fraction of 45-50%. Also denied September 25 the patient was more short of breath.  She slept in her lounge chair that night.  The following evening she was still short of breath and was having some chest discomfort on expiration.  She tried using her inhaler at home which did not seem  to help.  She went to urgent medical care and was seen by Melissa [name] who noted that she was in atrial fibrillation with a rapid ventricular response and she was referred to the emergency room where she was evaluated and admitted.  She was in atrial fibrillation on arrival here.  She converted overnight and by 07/10/10 she was back in sinus tachycardia.  Her EKG did not show any ischemic changes.  Her troponin was normal.  She has had no further chest discomfort.  Blood pressures have been normal here in the hospital.  She has a past history of hypotension secondary to diuretics. She is a diabetic and has diabetic neuropathy and is on Lyrica. Her echocardiogram on 07/12/15 showed an ejection fraction of 30-35% and there were inferior and inferolateral lateral and inferoseptal wall motion abnormalities.  There was also a questionable linear hyperechoic structure in the lumen of the ascending aorta.  This was felt to most likely represent a side lobe artifact.  I personally reviewed the echocardiographic images and I agree that this appears to be most likely an artifact.  Inpatient Medications  . amitriptyline  50 mg Oral QHS  . apixaban  2.5 mg Oral BID  . atorvastatin  40 mg Oral q1800  . ferrous [id number] C-folic acid  1 capsule Oral Q breakfast  . FLUoxetine  20 mg Oral Daily  . Influenza vac split quadrivalent PF  0.5 mL Intramuscular Tomorrow-1000  . insulin detemir  28 Units Subcutaneous QHS  . levothyroxine  50 mcg Oral QAC breakfast  . losartan  25 mg Oral Daily  . metoprolol tartrate  12.5 mg Oral BID  . pantoprazole  40 mg Oral BID  . pregabalin  150 mg Oral BID    Family History Family History  Problem Relation Age of Onset  . Stroke Mother   . Heart disease Father      Social History Social History   Social History  . Marital Status: Widowed    Spouse Name: N/A  . Number of Children: 3  . Years of Education: 11   Occupational History  . Retired     Social History Main Topics  . Smoking status: Former Smoker -- 1.00 packs/day for 20 years    Quit date: 09/21/1961  . Smokeless tobacco: Never Used  . Alcohol Use: No  . Drug Use: No  . Sexual Activity: Yes    Birth Control/ Protection: Post-menopausal   Other Topics Concern  . Not on file   Social History Narrative   Pt lives at home with her daughter and son in law.   Caffeine Use: very little     Review of Systems  General:  No chills, fever, night sweats or weight changes.  Cardiovascular:  No chest pain, dyspnea on exertion, edema, orthopnea, palpitations, paroxysmal nocturnal dyspnea. Dermatological: No rash, lesions/masses Respiratory: No cough, dyspnea Urologic: No hematuria, dysuria Abdominal:   No nausea, vomiting, diarrhea, bright red blood per rectum, melena, or hematemesis Neurologic:  No visual changes, wkns, changes in mental status. All other systems reviewed and are otherwise negative except as noted above.  Physical Exam  Blood pressure 127/93, pulse 98, temperature 97.6 F (36.4 C), temperature source Oral, resp. rate 18, height 5\' 5"  (1.651 m), weight 166 lb 0.1 oz (75.3 kg), SpO2 97 %.  General: Pleasant, NAD.  Alert and oriented Psych: Normal affect. Neuro: Alert and oriented X 3. Moves all extremities spontaneously. HEENT: Normal  Neck: Supple without bruits or JVD.  No carotid bruits Lungs:  Resp regular and unlabored, CTA. Heart: RRR no s3, s4, or murmurs.  No chest wall tenderness Abdomen: Soft, non-tender, non-distended, BS + x 4.  Extremities: No clubbing, cyanosis or edema. DP/PT/Radials 1+ and equal bilaterally.  Labs   Recent Labs  07/10/15 2013 07/11/15 0132 07/11/15 1210 07/11/15 2012  TROPONINI <0.03 0.03 <0.03 <0.03   Lab Results  Component Value Date   WBC 6.9 07/13/2015   HGB 11.2* 07/13/2015   HCT 35.2* 07/13/2015   MCV 85.6 07/13/2015   PLT 249 07/13/2015     Recent Labs Lab 07/13/15 0419  NA 134*  K 4.3  CL  99*  CO2 27  BUN 18  CREATININE 1.20*  CALCIUM 8.9  PROT 6.2*  BILITOT 0.5  ALKPHOS 54  ALT 13*  AST 15  GLUCOSE 136*   Lab Results  Component Value Date   CHOL 215* 07/11/2015   HDL 34* 07/11/2015   LDLCALC 120* 07/11/2015   TRIG 307* 07/11/2015   Lab Results  Component Value Date   DDIMER  09/08/2010    0.46        AT THE INHOUSE ESTABLISHED CUTOFF VALUE OF 0.48 ug/mL FEU, THIS ASSAY HAS BEEN DOCUMENTED IN THE LITERATURE TO HAVE A SENSITIVITY AND NEGATIVE PREDICTIVE  VALUE OF AT LEAST 98 TO 99%.  THE TEST RESULT SHOULD BE CORRELATED WITH AN ASSESSMENT OF THE CLINICAL PROBABILITY OF DVT / VTE.    Radiology/Studies  Dg Chest 2 View  07/10/2015   CLINICAL DATA:  Chest pain and chest pressure for 1 day  EXAM: CHEST  2 VIEW  COMPARISON:  September 24, 2011  FINDINGS: The heart size and mediastinal contours are within normal limits. There is no focal infiltrate, pulmonary edema, or pleural effusion. The visualized skeletal structures are unremarkable.  IMPRESSION: No active cardiopulmonary disease.   Electronically Signed   By: Abelardo Diesel M.D.   On: 07/10/2015 20:23   Ct Chest Wo Contrast  07/11/2015   CLINICAL DATA:  81 year old female with persisting dry cough for a year. Heavy previous smoking history. Evaluate for neoplasm.  EXAM: CT CHEST WITHOUT CONTRAST  TECHNIQUE: Multidetector CT imaging of the chest was performed following the standard protocol without IV contrast.  COMPARISON:  Radiographs 1 day prior.  Chest CT 09/08/2010  FINDINGS: Mediastinum/Nodes: The heart is normal in size. There are scattered coronary artery calcifications. Small mediastinal lymph nodes, not enlarged by size criteria. There is atherosclerosis of the thoracic aorta which is normal in caliber. No pericardial effusion.  Lungs/Pleura: Scattered small pulmonary nodules, majority of which are unchanged from prior CT. Largest nodule in the left lower lobe measures 5 mm, axial image 45/71 with  additional small nodules in the lower lobe more inferiorly. Tiny nodules in the left upper lobe and right lung apex. There is a tubular density in the anterior right upper lobe, 14 mm which is slightly more prominent on prior exam, however suggests possible mucous plugging. Faint subpleural reticulation and ground-glass opacity in the right lower lobe. No dominant pulmonary mass. There is no pleural effusion.  Upper abdomen: No acute abnormality. Gallbladder appears distended contains a dependent gallstone. Small hiatal hernia.  Musculoskeletal: There are no acute or suspicious osseous abnormalities.  IMPRESSION: 1. Scattered pulmonary nodules, the majority of which are unchanged from CT 5 years prior, and largest measuring 5 mm in the left lower lobe. There is a tubular opacity in the anterior right upper lobe suggestive of mucous plugging. Faint ground-glass opacities/reticulation in the right lower lobe, likely inflammatory or infectious in etiology. No pulmonary mass or pulmonary nodule suspicious for malignancy. 2. Atherosclerosis, including coronary artery calcifications.   Electronically Signed   By: Jeb Levering M.D.   On: 07/11/2015 21:20    ECG  Sinus tachycardia.  No ischemic changes.  Personally reviewed.  Initial EKG on admission showed atrial fibrillation, personally reviewed  ASSESSMENT AND PLAN  1.  Paroxysmal atrial fibrillation with rapid ventricular response.  The rapid heart rate is most likely responsible for her presenting symptoms of chest pain and shortness of breath.  We will continue Apixaban.  We'll switch her back to her home dose of Toprol 25 mg daily. 2.  Chronic systolic heart failure.  Recent echocardiogram shows worsening left ventricular systolic function with EF 30-35%.  We will continue Toprol and we will add an ARB for her chronic systolic heart failure.  Start with a low dose cautiously because of her past history of hypertension.  Avoid diuretics. 3.  Probable  underlying coronary artery disease with wall motion abnormalities.  Given her age and normal enzymes and normal EKG and previously normal Myoview, I would not pursue this any further.  Continue risk factor modification including optimal treatment of her diabetes and her elevated LDL cholesterol 4.  Mitral regurgitation, moderate  by echocardiogram.  Treat this with an ARB. Of note, her chest x-ray shows normal heart size and no evidence of CHF 5.  Diabetes mellitus with diabetic neuropathy  No further inpatient cardiology tests indicated. After discharge she should have close follow-up with Dr. Marlou [name] or APP and should have careful attention to blood pressure and get a follow-up BMET at that time to follow losartan.  Berna Spare MD 07/13/2015, 9:41 AM

## 2015-07-19 ENCOUNTER — Other Ambulatory Visit: Payer: Self-pay | Admitting: *Deleted

## 2015-07-19 MED ORDER — INSULIN DETEMIR 100 UNIT/ML FLEXPEN: PEN_INJECTOR | SUBCUTANEOUS | Status: DC

## 2015-07-20 DIAGNOSIS — I480 Paroxysmal atrial fibrillation: Secondary | ICD-10-CM | POA: Diagnosis not present

## 2015-07-20 DIAGNOSIS — J45909 Unspecified asthma, uncomplicated: Secondary | ICD-10-CM | POA: Diagnosis not present

## 2015-07-20 DIAGNOSIS — E785 Hyperlipidemia, unspecified: Secondary | ICD-10-CM | POA: Diagnosis not present

## 2015-07-20 DIAGNOSIS — Z79899 Other long term (current) drug therapy: Secondary | ICD-10-CM | POA: Diagnosis not present

## 2015-07-20 DIAGNOSIS — I10 Essential (primary) hypertension: Secondary | ICD-10-CM | POA: Diagnosis not present

## 2015-07-20 DIAGNOSIS — G4700 Insomnia, unspecified: Secondary | ICD-10-CM | POA: Diagnosis not present

## 2015-07-21 ENCOUNTER — Encounter: Payer: Self-pay | Admitting: Cardiology

## 2015-08-04 ENCOUNTER — Telehealth: Payer: Self-pay | Admitting: Cardiology

## 2015-08-04 ENCOUNTER — Other Ambulatory Visit: Payer: Self-pay | Admitting: *Deleted

## 2015-08-04 DIAGNOSIS — E1149 Type 2 diabetes mellitus with other diabetic neurological complication: Secondary | ICD-10-CM

## 2015-08-04 DIAGNOSIS — M5410 Radiculopathy, site unspecified: Principal | ICD-10-CM

## 2015-08-04 MED ORDER — LOSARTAN POTASSIUM 25 MG PO TABS: 25.0000 mg | ORAL_TABLET | Freq: Every day | ORAL | Status: DC

## 2015-08-04 MED ORDER — PREGABALIN 50 MG PO CAPS: ORAL_CAPSULE | ORAL | Status: DC

## 2015-08-04 NOTE — Telephone Encounter (Signed)
°  STAT if patient is at the pharmacy , call can be transferred to refill team.   1. Which medications need to be refilled? Cozaar  25mg   2. Which pharmacy/location is medication to be sent to?  [facility]  3. Do they need a 30 day or 90 day supply? [place]

## 2015-08-15 DIAGNOSIS — E119 Type 2 diabetes mellitus without complications: Secondary | ICD-10-CM | POA: Diagnosis not present

## 2015-08-15 LAB — HM DIABETES EYE EXAM

## 2015-08-22 ENCOUNTER — Ambulatory Visit (INDEPENDENT_AMBULATORY_CARE_PROVIDER_SITE_OTHER): Payer: Medicare Other | Admitting: Neurology

## 2015-08-22 VITALS — BP 136/75 | HR 90 | Ht 65.0 in | Wt 166.0 lb

## 2015-08-22 DIAGNOSIS — R202 Paresthesia of skin: Secondary | ICD-10-CM | POA: Diagnosis not present

## 2015-08-22 DIAGNOSIS — E538 Deficiency of other specified B group vitamins: Secondary | ICD-10-CM | POA: Diagnosis not present

## 2015-08-22 DIAGNOSIS — G250 Essential tremor: Secondary | ICD-10-CM | POA: Diagnosis not present

## 2015-08-22 DIAGNOSIS — E1142 Type 2 diabetes mellitus with diabetic polyneuropathy: Secondary | ICD-10-CM

## 2015-08-22 HISTORY — DX: Type 2 diabetes mellitus with diabetic polyneuropathy: E11.42

## 2015-08-22 HISTORY — DX: Essential tremor: G25.0

## 2015-08-22 HISTORY — DX: Paresthesia of skin: R20.2

## 2015-08-22 MED ORDER — PRAMIPEXOLE DIHYDROCHLORIDE 0.25 MG PO TABS
0.2500 mg | ORAL_TABLET | Freq: Every day | ORAL | Status: DC
Start: 2015-08-22 — End: 2015-10-12

## 2015-08-22 NOTE — Progress Notes (Signed)
Reason for visit: Sensory dysesthesias  Referring physician: Dr. Tama Headings is a 81 y.o. female  History of present illness:  Ms. Aye is an 81 year old right-handed white female with a history of diabetes with a diabetic peripheral neuropathy. The patient has had a chronic issue with anemia, she is on iron supplementation, she has borderline low ferritin levels. The patient indicates that over the last 2 years she has had a crawling sensation that goes up from the low back into the neck and shoulder area that occurs every night. She does not note any discomfort when she is lying flat on her back, but when she rolls to the side, she will have a sensation as if a rodent is crawling up her back. She has purchased another mattress, and she has individually stopped each one of her medications for a period of time to see if this improves the sensations, but without benefit. She does not note any problems during the daytime. She does have some urinary incontinence, but no difficulty with controlling the bowels. In the morning time, she will have some crepitus in the neck and some discomfort. This goes away by midday. She is on Lyrica for her peripheral neuropathy, but the Lyrica does nothing for these nocturnal sensations. She is not sleeping well. She denies any significant balance issues. She has no weakness of the extremities. There is no history of definite restless legs symptoms, but the back sensation does result in some restlessness.  Past Medical History  Diagnosis Date  . Asthma   . Sleep apnea   . Angina   . Hypertension   . Blood transfusion   . GERD (gastroesophageal reflux disease)   . Headache(784.0)   . Arthritis   . Anxiety   . Pneumonia   . Diabetes mellitus   . Anemia   . Hiatal hernia   . Acute kidney injury ([place]) 09/26/2011  . High cholesterol   . Unspecified hypothyroidism 06/15/2013  . Cardiomyopathy ([place]) 09/02/2013    EF 45%, 12/12  . Paresthesia  08/22/2015  . Diabetic peripheral neuropathy associated with type 2 diabetes mellitus ([place]) 08/22/2015  . Diabetic peripheral neuropathy associated with type 2 diabetes mellitus ([place]) 08/22/2015  . Tremor, essential 08/22/2015    Past Surgical History  Procedure Laterality Date  . Abdominal hysterectomy    . Appendectomy    . Eye surgery    . Tonsillectomy    . Breast surgery      Family History  Problem Relation Age of Onset  . Stroke Mother   . Heart attack Mother   . Heart disease Father   . Cancer Father     Social history:  reports that she quit smoking about 53 years ago. She has never used smokeless tobacco. She reports that she does not drink alcohol or use illicit drugs.  Medications:  Prior to Admission medications   Medication Sig Start Date End Date Taking? Authorizing Provider  acetaminophen (TYLENOL) 500 MG tablet Take 1,000 mg by mouth every 6 (six) hours as needed for mild pain.    Yes Historical Provider, MD  AMOXICILLIN PO Take by mouth.   Yes Historical Provider, MD  apixaban (ELIQUIS) 2.5 MG TABS tablet Take 1 tablet (2.5 mg total) by mouth 2 (two) times daily. 04/25/15  Yes [name], MD  atorvastatin (LIPITOR) 40 MG tablet Take 1 tablet (40 mg total) by mouth daily at 6 PM. 07/13/15  Yes Geradine Girt, DO  Cholecalciferol (VITAMIN D3) 1000 UNITS CAPS Take 1 capsule by mouth daily.   Yes Historical Provider, MD  esomeprazole (NEXIUM) 40 MG capsule Take 40 mg by mouth 2 (two) times daily.     Yes Historical Provider, MD  Fe Fum-FePoly-Vit C-Vit B3 (INTEGRA) 62.5-62.5-40-3 MG CAPS Take 1 capsule by mouth daily. 04/16/15  Yes Historical Provider, MD  FLUoxetine (PROZAC) 20 MG capsule Take 20 mg by mouth daily.  12/24/12  Yes Historical Provider, MD  Insulin Aspart (NOVOLOG Arnold) Inject 4 Units into the skin 3 (three) times daily.    Yes Historical Provider, MD  Insulin Detemir (LEVEMIR FLEXTOUCH) 100 UNIT/ML Pen Inject 34 units at bedtime 07/19/15  Yes [name], MD    levalbuterol Penne Lash) 0.63 MG/3ML nebulizer solution Take 0.63 mg by nebulization every 6 (six) hours as needed for wheezing or shortness of breath.  09/28/14  Yes Historical Provider, MD  levothyroxine (SYNTHROID, LEVOTHROID) 50 MCG tablet TAKE 1 TABLET BY MOUTH EVERY DAY 06/23/13  Yes [name], MD  Liraglutide (VICTOZA) 18 MG/3ML SOPN Inject 1.2 mg into the skin daily. 07/21/14  Yes [name], MD  losartan (COZAAR) 25 MG tablet Take 1 tablet (25 mg total) by mouth daily. 08/04/15  Yes [name], MD  metoprolol succinate (TOPROL XL) 25 MG 24 hr tablet Take 1 tablet (25 mg total) by mouth daily. 04/25/15  Yes [name], MD  pregabalin (LYRICA) 50 MG capsule Take up to 3 capsules daily as needed 08/04/15  Yes [name], MD  traZODone (DESYREL) 50 MG tablet Take 1 tablet by mouth daily. 08/10/15  Yes Historical Provider, MD     No Known Allergies  ROS:  Out of a complete 14 system review of symptoms, the patient complains only of the following symptoms, and all other reviewed systems are negative.  Hearing loss, ringing in the ears, dizziness Cough, wheezing Urination problems Joint pain, joint swelling, aching muscles Allergies, runny nose Memory loss, confusion, headache, dizziness Depression, anxiety, decreased energy  Blood pressure 136/75, pulse 90, height 5\' 5"  (1.651 m), weight 166 lb (75.297 kg).  Physical Exam  General: The patient is alert and cooperative at the time of the examination.  Eyes: Pupils are equal, round, and reactive to light. Discs are flat bilaterally.  Neck: The neck is supple, no carotid bruits are noted on the left, a mild bruit noted on the right.  Respiratory: The respiratory examination is clear.  Cardiovascular: The cardiovascular examination reveals a regular rate and rhythm, no obvious murmurs or rubs are noted.  Skin: Extremities are without significant edema.  Neurologic Exam  Mental status: The patient is alert and oriented  x 3 at the time of the examination. The patient has apparent normal recent and remote memory, with an apparently normal attention span and concentration ability.  Cranial nerves: Facial symmetry is present. There is good sensation of the face to pinprick and soft touch bilaterally. The strength of the facial muscles and the muscles to head turning and shoulder shrug are normal bilaterally. Speech is well enunciated, no aphasia or dysarthria is noted. Extraocular movements are full. Visual fields are full. The tongue is midline, and the patient has symmetric elevation of the soft palate. No obvious hearing deficits are noted.  Motor: The motor testing reveals 5 over 5 strength of all 4 extremities. Good symmetric motor tone is noted throughout.  Sensory: Sensory testing is intact to pinprick, soft touch, vibration sensation, and position sense on all 4 extremities,  with exception of a stocking pattern pinprick sensory deficit in the distal one half of the legs bilaterally. No evidence of extinction is noted.  Coordination: Cerebellar testing reveals good finger-nose-finger and heel-to-shin bilaterally.  Gait and station: Gait is normal. Tandem gait is unsteady. Romberg is negative. No drift is seen.  Reflexes: Deep tendon reflexes are symmetric, but are depressed bilaterally. Toes are downgoing bilaterally.   Assessment/Plan:  1. Diabetic peripheral neuropathy  2. Chronic iron deficiency anemia  3. Dysesthesias, back and neck area  The patient is having nocturnal events of sensory dysesthesias involving the back, these symptoms go away immediately when rolling on the back, or when she is up and active. The patient does have a peripheral neuropathy. Will send the patient for blood work today. I will initiate treatment as if the patient had a variant of restless leg syndrome, I will initiate low-dose Mirapex at night. If this is effective, the dose can be increased as needed. She will follow-up  in 3-4 months.  Jill Alexanders MD 08/22/2015 6:42 PM  [facility] [address] [place], [place] [address]  Phone [phone] Fax [phone]

## 2015-08-22 NOTE — Patient Instructions (Signed)
Paresthesia Paresthesia is an abnormal burning or prickling sensation. This sensation is generally felt in the hands, arms, legs, or feet. However, it may occur in any part of the body. Usually, it is not painful. The feeling may be described as:  Tingling or numbness.  Pins and needles.  Skin crawling.  Buzzing.  Limbs falling asleep.  Itching. Most people experience temporary (transient) paresthesia at some time in their lives. Paresthesia may occur when you breathe too quickly (hyperventilation). It can also occur without any apparent cause. Commonly, paresthesia occurs when pressure is placed on a nerve. The sensation quickly goes away after the pressure is removed. For some people, however, paresthesia is a long-lasting (chronic) condition that is caused by an underlying disorder. If you continue to have paresthesia, you may need further medical evaluation. HOME CARE INSTRUCTIONS Watch your condition for any changes. Taking the following actions may help to lessen any discomfort that you are feeling:  Avoid drinking alcohol.  Try acupuncture or massage to help relieve your symptoms.  Keep all follow-up visits as directed by your health care provider. This is important. SEEK MEDICAL CARE IF:  You continue to have episodes of paresthesia.  Your burning or prickling feeling gets worse when you walk.  You have pain, cramps, or dizziness.  You develop a rash. SEEK IMMEDIATE MEDICAL CARE IF:  You feel weak.  You have trouble walking or moving.  You have problems with speech, understanding, or vision.  You feel confused.  You cannot control your bladder or bowel movements.  You have numbness after an injury.  You faint.   This information is not intended to replace advice given to you by your health care provider. Make sure you discuss any questions you have with your health care provider.   Document Released: 09/20/2002 Document Revised: 02/14/2015 Document Reviewed:  09/26/2014 Elsevier Interactive Patient Education 2016 [employer]  

## 2015-08-23 ENCOUNTER — Ambulatory Visit (INDEPENDENT_AMBULATORY_CARE_PROVIDER_SITE_OTHER): Payer: Medicare Other | Admitting: Cardiology

## 2015-08-23 ENCOUNTER — Encounter: Payer: Self-pay | Admitting: Cardiology

## 2015-08-23 VITALS — BP 130/70 | HR 95 | Ht 65.5 in | Wt 167.8 lb

## 2015-08-23 DIAGNOSIS — I10 Essential (primary) hypertension: Secondary | ICD-10-CM

## 2015-08-23 DIAGNOSIS — I420 Dilated cardiomyopathy: Secondary | ICD-10-CM | POA: Diagnosis not present

## 2015-08-23 DIAGNOSIS — Z7901 Long term (current) use of anticoagulants: Secondary | ICD-10-CM | POA: Diagnosis not present

## 2015-08-23 DIAGNOSIS — I480 Paroxysmal atrial fibrillation: Secondary | ICD-10-CM

## 2015-08-23 MED ORDER — METOPROLOL SUCCINATE ER 50 MG PO TB24: 50.0000 mg | ORAL_TABLET | Freq: Every day | ORAL | Status: DC

## 2015-08-23 NOTE — Patient Instructions (Signed)
Medication Instructions:  Please increase your Metoprolol to50 mg a day. Continue all other medications as listed.  Follow-Up: Follow up in 6 months with [name].  You will receive a letter in the mail 2 months before you are due.  Please call us when you receive this letter to schedule your follow up appointment.  If you need a refill on your cardiac medications before your next appointment, please call your pharmacy.  Thank you for choosing [facility]!!

## 2015-08-23 NOTE — Progress Notes (Signed)
[facility]. [address], [address], [place]  [address] Phone: [phone] Fax:  [phone]  Date:  08/23/2015   ID:  Melissa [name], DOB 24-Jun-1928, MRN [id number]  PCP:  [name], MD   History of Present Illness: Melissa [name] is a 81 y.o. female with chronic systolic heart failure, decreased ejection fraction of 35%, previously had returned to normal, here for follow-up atrial fibrillation.. She has diabetes, hypertension, hyperlipidemia. Prior hospitalization in mid December 2012 secondary to altered mental status and increasing O2 demands. Her BNP was noted to be elevated at 3031.   Stood at desk at [employer], could not speak felt like she was loosing vision which occurred after she stood up from the chair.  Felt like could not speak. When EMS came, her blood pressure was low. Could not palpate. She was dehydrated. BP was orthostatic. CT scan and ECG OK. Felt fine since since then. Been off maxide since 2015.   Echocardiogram 2012 showed an EF of 45-50% mildly reduced. Chest x-ray showed no acute disease.   Stress test was performed on 11/08/11 and was low risk, no ischemia, normal EF actually.   Had event monitor 01/17/2015 which showed brief episode, 34 minutes of atrial fibrillation, fastest heart rate was 166 bpm at the time.  Echocardiogram 06/2015 showed reduction in ejection fraction to 30-35%. Plaque buildup in the aorta.  Interestingly, she is feeling well, she is not having any heart failure-like symptoms. No orthopnea, no PND. NYHA class I.      Wt Readings from Last 3 Encounters:  08/23/15 167 lb 12.8 oz (76.114 kg)  08/22/15 166 lb (75.297 kg)  07/10/15 166 lb 0.1 oz (75.3 kg)     Past Medical History  Diagnosis Date  . Asthma   . Sleep apnea   . Angina   . Hypertension   . Blood transfusion   . GERD (gastroesophageal reflux disease)   . Headache(784.0)   . Arthritis   . Anxiety   . Pneumonia   . Diabetes mellitus   . Anemia   . Hiatal  hernia   . Acute kidney injury ([place]) 09/26/2011  . High cholesterol   . Unspecified hypothyroidism 06/15/2013  . Cardiomyopathy ([place]) 09/02/2013    EF 45%, 12/12  . Paresthesia 08/22/2015  . Diabetic peripheral neuropathy associated with type 2 diabetes mellitus ([place]) 08/22/2015  . Diabetic peripheral neuropathy associated with type 2 diabetes mellitus ([place]) 08/22/2015  . Tremor, essential 08/22/2015    Past Surgical History  Procedure Laterality Date  . Abdominal hysterectomy    . Appendectomy    . Eye surgery    . Tonsillectomy    . Breast surgery      Current Outpatient Prescriptions  Medication Sig Dispense Refill  . acetaminophen (TYLENOL) 500 MG tablet Take 1,000 mg by mouth every 6 (six) hours as needed for mild pain.     Marland Kitchen AMOXICILLIN PO Take by mouth.    Marland Kitchen apixaban (ELIQUIS) 2.5 MG TABS tablet Take 1 tablet (2.5 mg total) by mouth 2 (two) times daily. 180 tablet 3  . atorvastatin (LIPITOR) 40 MG tablet Take 1 tablet (40 mg total) by mouth daily at 6 PM. 30 tablet 0  . Cholecalciferol (VITAMIN D3) 1000 UNITS CAPS Take 1 capsule by mouth daily.    Marland Kitchen esomeprazole (NEXIUM) 40 MG capsule Take 40 mg by mouth 2 (two) times daily.      . Fe Fum-FePoly-Vit C-Vit B3 (INTEGRA) 62.5-62.5-40-3  MG CAPS Take 1 capsule by mouth daily.  3  . FLUoxetine (PROZAC) 20 MG capsule Take 20 mg by mouth daily.     . Insulin Aspart (NOVOLOG [place]) Inject 4 Units into the skin 3 (three) times daily.     . Insulin Detemir (LEVEMIR FLEXTOUCH) 100 UNIT/ML Pen Inject 34 units at bedtime 30 mL 1  . levalbuterol (XOPENEX) 0.63 MG/3ML nebulizer solution Take 0.63 mg by nebulization every 6 (six) hours as needed for wheezing or shortness of breath.   0  . levothyroxine (SYNTHROID, LEVOTHROID) 50 MCG tablet TAKE 1 TABLET BY MOUTH EVERY DAY 30 tablet 2  . Liraglutide (VICTOZA) 18 MG/3ML SOPN Inject 1.2 mg into the skin daily. 6 pen 5  . losartan (COZAAR) 25 MG tablet Take 1 tablet (25 mg total) by mouth daily. 30  tablet 3  . metoprolol succinate (TOPROL XL) 25 MG 24 hr tablet Take 1 tablet (25 mg total) by mouth daily. 90 tablet 3  . pramipexole (MIRAPEX) 0.25 MG tablet Take 1 tablet (0.25 mg total) by mouth at bedtime. 30 tablet 1  . pregabalin (LYRICA) 50 MG capsule Take up to 3 capsules daily as needed 270 capsule 1  . traZODone (DESYREL) 50 MG tablet Take 1 tablet by mouth daily.  0   No current facility-administered medications for this visit.    Allergies:   No Known Allergies  Social History:  The patient  reports that she quit smoking about 53 years ago. She has never used smokeless tobacco. She reports that she does not drink alcohol or use illicit drugs.   ROS:  Please see the history of present illness.   Denies any syncope, bleeding, orthopnea, PND, shortness of breath  PHYSICAL EXAM: VS:  BP 130/70 mmHg  Pulse 95  Ht 5' 5.5" (1.664 m)  Wt 167 lb 12.8 oz (76.114 kg)  BMI 27.49 kg/m2  SpO2 96% Well nourished, well developed, in no acute distress HEENT: normal Neck: no JVD Cardiac:  normal S1, S2; RRR/normal rate; no murmur Lungs:  clear to auscultation bilaterally, no wheezing, rhonchi or rales Abd: soft, nontender, no hepatomegaly Ext: no edema today Skin: warm and dry Neuro: no focal abnormalities noted  EKG:  None today     ASSESSMENT AND PLAN:  1. Atrial fibrillation, paroxysmal-34 minutes noted on event monitor through April 2016. Heart rate consisted of rapid ventricular response. She was started on anticoagulation, apixaban 2.5 mg twice a day. Creatinine at one point was 1.8 and she is almost 81 years old. We discussed bleeding risks.  increasing Toprol-XL to 50 mg. EF 35%. 2. Near syncope-no further episodes. Previously sounds mostly orthostatic in origin. She still.and started to lose her vision, could not speak. She has had an extensive neurologic evaluation 2 years ago, unremarkable. 3. Cardiomyopathy- reduced LV systolic dysfunction has returned. No diuretic  therapy currently because of relative hypotension. She is well compensated. 4. Hypertension-currently very well controlled. In fact, mildly low.  5. Hyperlipidemia-continue with low-dose statin. LDL 89 on 12/15/13 6. Chronic kidney disease stage III-creatinine 1.3 at last check. Avoid NSAIDs 7. See her back in 6 months, she will call me if she's having any difficulties with Toprol.  Signed, [name], MD [facility]  08/23/2015 3:07 PM

## 2015-08-25 ENCOUNTER — Telehealth: Payer: Self-pay | Admitting: Neurology

## 2015-08-25 LAB — VITAMIN B12: VITAMIN B 12: 183 pg/mL — AB (ref 211–946)

## 2015-08-25 LAB — METHYLMALONIC ACID, SERUM: Methylmalonic Acid: 609 nmol/L — ABNORMAL HIGH (ref 0–378)

## 2015-08-25 LAB — COPPER, SERUM: COPPER: 112 ug/dL (ref 72–166)

## 2015-08-25 NOTE — Telephone Encounter (Signed)
I called the patient, talk with the granddaughter. The vitamin B12 level is low, we will have her come in for injections. Granddaughter indicates that in the past oral supplementation was not effective, she did get vitamin B12 injections in the past, but she has not had any in several years.

## 2015-08-27 ENCOUNTER — Emergency Department (INDEPENDENT_AMBULATORY_CARE_PROVIDER_SITE_OTHER): Payer: Medicare Other

## 2015-08-27 ENCOUNTER — Encounter (HOSPITAL_COMMUNITY): Payer: Self-pay | Admitting: *Deleted

## 2015-08-27 ENCOUNTER — Emergency Department (INDEPENDENT_AMBULATORY_CARE_PROVIDER_SITE_OTHER)
Admission: EM | Admit: 2015-08-27 | Discharge: 2015-08-27 | Disposition: A | Discharge status: Home / Self Care | Payer: Medicare Other | Source: Home / Self Care | Attending: Family Medicine | Admitting: Family Medicine

## 2015-08-27 DIAGNOSIS — R05 Cough: Secondary | ICD-10-CM | POA: Diagnosis not present

## 2015-08-27 DIAGNOSIS — J069 Acute upper respiratory infection, unspecified: Secondary | ICD-10-CM

## 2015-08-27 MED ORDER — IPRATROPIUM BROMIDE 0.06 % NA SOLN: 2.0000 | Freq: Four times a day (QID) | NASAL | Status: DC

## 2015-08-27 NOTE — Discharge Instructions (Signed)
Drink plenty of fluids as discussed, use medicine as prescribed, and mucinex or delsym for cough. Return or see your doctor if further problems °

## 2015-08-27 NOTE — ED Provider Notes (Signed)
CSN: [id number]     Arrival date & time 08/27/15  1430 History   First MD Initiated Contact with Patient 08/27/15 1522     Chief Complaint  Patient presents with  . Cough   (Consider location/radiation/quality/duration/timing/severity/associated sxs/prior Treatment) Patient is a 81 y.o. female presenting with cough. The history is provided by the patient and a friend.  Cough Cough characteristics:  Productive and harsh Sputum characteristics:  Clear Severity:  Mild Onset quality:  Gradual Duration:  3 weeks Progression:  Unchanged Chronicity:  New Smoker: no   Context: upper respiratory infection and weather changes   Ineffective treatments:  Cough suppressants Associated symptoms: no chest pain, no fever, no rash, no shortness of breath and no sore throat     Past Medical History  Diagnosis Date  . Asthma   . Sleep apnea   . Angina   . Hypertension   . Blood transfusion   . GERD (gastroesophageal reflux disease)   . Headache(784.0)   . Arthritis   . Anxiety   . Pneumonia   . Diabetes mellitus   . Anemia   . Hiatal hernia   . Acute kidney injury ([place]) 09/26/2011  . High cholesterol   . Unspecified hypothyroidism 06/15/2013  . Cardiomyopathy ([place]) 09/02/2013    EF 45%, 12/12  . Paresthesia 08/22/2015  . Diabetic peripheral neuropathy associated with type 2 diabetes mellitus ([place]) 08/22/2015  . Diabetic peripheral neuropathy associated with type 2 diabetes mellitus ([place]) 08/22/2015  . Tremor, essential 08/22/2015   Past Surgical History  Procedure Laterality Date  . Abdominal hysterectomy    . Appendectomy    . Eye surgery    . Tonsillectomy    . Breast surgery     Family History  Problem Relation Age of Onset  . Stroke Mother   . Heart attack Mother   . Heart disease Father   . Cancer Father    Social History  Substance Use Topics  . Smoking status: Former Smoker -- 1.00 packs/day for 20 years    Quit date: 09/21/1961  . Smokeless tobacco: Never Used  .  Alcohol Use: No   OB History    No data available     Review of Systems  Constitutional: Negative.  Negative for fever.  HENT: Positive for congestion and postnasal drip. Negative for sore throat.   Respiratory: Positive for cough. Negative for shortness of breath.   Cardiovascular: Negative.  Negative for chest pain.  Gastrointestinal: Negative.   Skin: Negative.  Negative for rash.  All other systems reviewed and are negative.   Allergies  Review of patient's allergies indicates no known allergies.  Home Medications   Prior to Admission medications   Medication Sig Start Date End Date Taking? Authorizing Provider  acetaminophen (TYLENOL) 500 MG tablet Take 1,000 mg by mouth every 6 (six) hours as needed for mild pain.     Historical Provider, MD  AMOXICILLIN PO Take by mouth.    Historical Provider, MD  apixaban (ELIQUIS) 2.5 MG TABS tablet Take 1 tablet (2.5 mg total) by mouth 2 (two) times daily. 04/25/15   [name], MD  atorvastatin (LIPITOR) 40 MG tablet Take 1 tablet (40 mg total) by mouth daily at 6 PM. 07/13/15   Geradine Girt, DO  Cholecalciferol (VITAMIN D3) 1000 UNITS CAPS Take 1 capsule by mouth daily.    Historical Provider, MD  esomeprazole (NEXIUM) 40 MG capsule Take 40 mg by mouth 2 (two) times daily.  Historical Provider, MD  Fe Fum-FePoly-Vit C-Vit B3 (INTEGRA) 62.5-62.5-40-3 MG CAPS Take 1 capsule by mouth daily. 04/16/15   Historical Provider, MD  FLUoxetine (PROZAC) 20 MG capsule Take 20 mg by mouth daily.  12/24/12   Historical Provider, MD  Insulin Aspart (NOVOLOG St. ) Inject 4 Units into the skin 3 (three) times daily.     Historical Provider, MD  Insulin Detemir (LEVEMIR FLEXTOUCH) 100 UNIT/ML Pen Inject 34 units at bedtime 07/19/15   [name], MD  ipratropium (ATROVENT) 0.06 % nasal spray Place 2 sprays into both nostrils 4 (four) times daily. 08/27/15   Billy Fischer, MD  levalbuterol Penne Lash) 0.63 MG/3ML nebulizer solution Take 0.63 mg by  nebulization every 6 (six) hours as needed for wheezing or shortness of breath.  09/28/14   Historical Provider, MD  levothyroxine (SYNTHROID, LEVOTHROID) 50 MCG tablet TAKE 1 TABLET BY MOUTH EVERY DAY 06/23/13   [name], MD  Liraglutide (VICTOZA) 18 MG/3ML SOPN Inject 1.2 mg into the skin daily. 07/21/14   [name], MD  losartan (COZAAR) 25 MG tablet Take 1 tablet (25 mg total) by mouth daily. 08/04/15   [name], MD  metoprolol succinate (TOPROL-XL) 50 MG 24 hr tablet Take 1 tablet (50 mg total) by mouth daily. 08/23/15   [name], MD  pramipexole (MIRAPEX) 0.25 MG tablet Take 1 tablet (0.25 mg total) by mouth at bedtime. 08/22/15   Kathrynn Ducking, MD  pregabalin (LYRICA) 50 MG capsule Take up to 3 capsules daily as needed 08/04/15   [name], MD  traZODone (DESYREL) 50 MG tablet Take 1 tablet by mouth daily. 08/10/15   Historical Provider, MD   Meds Ordered and Administered this Visit  Medications - No data to display  BP 133/60 mmHg  Pulse 109  Temp(Src) 97.8 F (36.6 C) (Oral)  SpO2 94% No data found.   Physical Exam  Constitutional: She is oriented to person, place, and time. She appears well-developed and well-nourished. No distress.  HENT:  Right Ear: External ear normal.  Left Ear: External ear normal.  Mouth/Throat: Oropharynx is clear and moist.  Eyes: Conjunctivae are normal. Pupils are equal, round, and reactive to light.  Neck: Normal range of motion. Neck supple.  Cardiovascular: Regular rhythm, normal heart sounds and intact distal pulses.   Pulmonary/Chest: Effort normal and breath sounds normal.  Abdominal: Soft. Bowel sounds are normal. There is no tenderness.  Lymphadenopathy:    She has no cervical adenopathy.  Neurological: She is alert and oriented to person, place, and time.  Skin: Skin is warm and dry.  Nursing note and vitals reviewed.   ED Course  Procedures (including critical care time)  Labs Review Labs Reviewed - No data to  display  Imaging Review Dg Chest 2 View  08/27/2015  CLINICAL DATA:  Intermittent cough for 3 weeks. EXAM: CHEST  2 VIEW COMPARISON:  07/11/2015 FINDINGS: Atherosclerotic aortic arch. The lungs appear clear. No pleural effusion. Heart size within normal limits. IMPRESSION: 1. No acute findings.  The lungs appear clear. 2. Atherosclerotic calcification of the aortic arch. Electronically Signed   By: Van Clines M.D.   On: 08/27/2015 15:48   X-rays reviewed and report per radiologist.     Visual Acuity Review  Right Eye Distance:   Left Eye Distance:   Bilateral Distance:    Right Eye Near:   Left Eye Near:    Bilateral Near:         MDM  1. URI (upper respiratory infection)        Billy Fischer, MD 08/27/15 [phone]

## 2015-08-27 NOTE — ED Notes (Signed)
Pt reports   Symptoms  Of cough   /   Congested           Mucous     And        Productive  Cough   With  Symptoms    X  3   Weeks        Pt  Sitting  Upright  On   Exam      Table  Speaking  In   Complete  sentances

## 2015-08-28 NOTE — Telephone Encounter (Signed)
LVM for granddaughter, Cathren Laine (ok per DPR) to call back and schedule B12 injection for this week per Dr Jannifer [name] request. Josefine Class phone number and office hours.

## 2015-08-29 ENCOUNTER — Telehealth: Payer: Self-pay | Admitting: *Deleted

## 2015-08-29 NOTE — Telephone Encounter (Signed)
PT daughter called back and scheduled B12 injection for Thursday at 4pm per Dr Jannifer [name] request.

## 2015-08-30 NOTE — Telephone Encounter (Signed)
Appointment scheduled 11/17.

## 2015-08-31 ENCOUNTER — Encounter (INDEPENDENT_AMBULATORY_CARE_PROVIDER_SITE_OTHER): Payer: Self-pay

## 2015-08-31 ENCOUNTER — Ambulatory Visit (INDEPENDENT_AMBULATORY_CARE_PROVIDER_SITE_OTHER): Payer: Medicare Other

## 2015-08-31 DIAGNOSIS — E538 Deficiency of other specified B group vitamins: Secondary | ICD-10-CM | POA: Diagnosis not present

## 2015-08-31 MED ORDER — CYANOCOBALAMIN 1000 MCG/ML IJ SOLN
1000.0000 ug | Freq: Once | INTRAMUSCULAR | Status: AC
  Administered 2015-08-31: 1000 ug via INTRAMUSCULAR

## 2015-09-19 DIAGNOSIS — E1165 Type 2 diabetes mellitus with hyperglycemia: Secondary | ICD-10-CM | POA: Diagnosis not present

## 2015-09-19 DIAGNOSIS — Z79899 Other long term (current) drug therapy: Secondary | ICD-10-CM | POA: Diagnosis not present

## 2015-09-19 DIAGNOSIS — N390 Urinary tract infection, site not specified: Secondary | ICD-10-CM | POA: Diagnosis not present

## 2015-09-19 DIAGNOSIS — E785 Hyperlipidemia, unspecified: Secondary | ICD-10-CM | POA: Diagnosis not present

## 2015-09-19 DIAGNOSIS — E559 Vitamin D deficiency, unspecified: Secondary | ICD-10-CM | POA: Diagnosis not present

## 2015-09-19 DIAGNOSIS — D649 Anemia, unspecified: Secondary | ICD-10-CM | POA: Diagnosis not present

## 2015-09-19 DIAGNOSIS — Z0000 Encounter for general adult medical examination without abnormal findings: Secondary | ICD-10-CM | POA: Diagnosis not present

## 2015-09-19 DIAGNOSIS — N183 Chronic kidney disease, stage 3 (moderate): Secondary | ICD-10-CM | POA: Diagnosis not present

## 2015-09-19 DIAGNOSIS — R251 Tremor, unspecified: Secondary | ICD-10-CM | POA: Diagnosis not present

## 2015-09-19 DIAGNOSIS — I480 Paroxysmal atrial fibrillation: Secondary | ICD-10-CM | POA: Diagnosis not present

## 2015-09-19 DIAGNOSIS — Z794 Long term (current) use of insulin: Secondary | ICD-10-CM | POA: Diagnosis not present

## 2015-09-19 DIAGNOSIS — E1121 Type 2 diabetes mellitus with diabetic nephropathy: Secondary | ICD-10-CM | POA: Diagnosis not present

## 2015-09-25 ENCOUNTER — Other Ambulatory Visit: Payer: Self-pay

## 2015-09-25 DIAGNOSIS — N644 Mastodynia: Secondary | ICD-10-CM

## 2015-09-26 ENCOUNTER — Other Ambulatory Visit: Payer: Self-pay | Admitting: *Deleted

## 2015-09-26 ENCOUNTER — Ambulatory Visit (INDEPENDENT_AMBULATORY_CARE_PROVIDER_SITE_OTHER): Payer: Medicare Other | Admitting: Internal Medicine

## 2015-09-26 VITALS — BP 113/68 | HR 91 | Temp 98.1°F | Resp 16 | Ht 65.0 in | Wt 161.0 lb

## 2015-09-26 DIAGNOSIS — G4452 New daily persistent headache (NDPH): Secondary | ICD-10-CM | POA: Diagnosis not present

## 2015-09-26 DIAGNOSIS — N289 Disorder of kidney and ureter, unspecified: Secondary | ICD-10-CM | POA: Diagnosis not present

## 2015-09-26 DIAGNOSIS — N183 Chronic kidney disease, stage 3 (moderate): Secondary | ICD-10-CM | POA: Diagnosis not present

## 2015-09-26 DIAGNOSIS — E538 Deficiency of other specified B group vitamins: Secondary | ICD-10-CM

## 2015-09-26 DIAGNOSIS — N390 Urinary tract infection, site not specified: Secondary | ICD-10-CM | POA: Diagnosis not present

## 2015-09-26 DIAGNOSIS — Z794 Long term (current) use of insulin: Secondary | ICD-10-CM

## 2015-09-26 DIAGNOSIS — R1033 Periumbilical pain: Secondary | ICD-10-CM | POA: Diagnosis not present

## 2015-09-26 DIAGNOSIS — R634 Abnormal weight loss: Secondary | ICD-10-CM

## 2015-09-26 DIAGNOSIS — E1122 Type 2 diabetes mellitus with diabetic chronic kidney disease: Secondary | ICD-10-CM | POA: Diagnosis not present

## 2015-09-26 DIAGNOSIS — R630 Anorexia: Secondary | ICD-10-CM | POA: Diagnosis not present

## 2015-09-26 LAB — POCT URINALYSIS DIP (MANUAL ENTRY)
BILIRUBIN UA: NEGATIVE
Blood, UA: NEGATIVE
GLUCOSE UA: NEGATIVE
Ketones, POC UA: NEGATIVE
LEUKOCYTES UA: NEGATIVE
NITRITE UA: NEGATIVE
Protein Ur, POC: NEGATIVE
Spec Grav, UA: 1.015
Urobilinogen, UA: 0.2
pH, UA: 6

## 2015-09-26 LAB — POC MICROSCOPIC URINALYSIS (UMFC): MUCUS RE: ABSENT

## 2015-09-26 LAB — POCT CBC
Granulocyte percent: 63.4 %G (ref 37–80)
HCT, POC: 38.6 % (ref 37.7–47.9)
Hemoglobin: 13.1 g/dL (ref 12.2–16.2)
LYMPH, POC: 2.2 (ref 0.6–3.4)
MCH: 28.4 pg (ref 27–31.2)
MCHC: 34 g/dL (ref 31.8–35.4)
MCV: 83.4 fL (ref 80–97)
MID (CBC): 0.5 (ref 0–0.9)
MPV: 5.9 fL (ref 0–99.8)
POC Granulocyte: 4.6 (ref 2–6.9)
POC LYMPH %: 30.2 % (ref 10–50)
POC MID %: 6.4 % (ref 0–12)
Platelet Count, POC: 251 10*3/uL (ref 142–424)
RBC: 4.63 M/uL (ref 4.04–5.48)
RDW, POC: 14.3 %
WBC: 7.3 10*3/uL (ref 4.6–10.2)

## 2015-09-26 LAB — POCT GLYCOSYLATED HEMOGLOBIN (HGB A1C): Hemoglobin A1C: 6

## 2015-09-26 MED ORDER — LIRAGLUTIDE 18 MG/3ML ~~LOC~~ SOPN: 1.2000 mg | PEN_INJECTOR | Freq: Every day | SUBCUTANEOUS | Status: DC

## 2015-09-26 MED ORDER — ONDANSETRON HCL 4 MG PO TABS: 4.0000 mg | ORAL_TABLET | Freq: Three times a day (TID) | ORAL | Status: DC | PRN

## 2015-09-26 NOTE — Progress Notes (Signed)
Subjective:  This chart was scribed for Tami Lin, MD by Moises Blood, Medical Scribe. This patient was seen in Room 10 and the patient's care was started at 5:11 PM.    Patient ID: Melissa [name], female    DOB: Sep 06, 1928, 81 y.o.   MRN: [id number] Chief Complaint  Patient presents with  . Abdominal Pain    2 months  . Anorexia    2 months  . Headache    HPI Melissa [name] is a 81 y.o. female who presents to [facility] complaining of persistent abd pain and loss of appetite for about a month now. When she eats, she feels worse. She's been experiencing fatigue, and weight loss. She mentions she has aching abd pain that would wake her up at night but no heartburn or dyspepsia. She denies vomiting, diarrhea, constipation. She denies change with diabetes. No urinary symptoms altho on macrobid for UTI that broke thru amox suppression(recur UTI). No new meds otherwise. She's had endos and colonoscopy Dr Watt Climes a few years back without abnormalities. Has been seen by PCP for this 2-3 w ago and was told it would pass.No Labs.   Headaches Intermittent, all over Her vision becomes blurry when she has headaches but is ok now. She also notes dizziness with headaches.  No gait prob;lems  .   PSHx She still has gall bladder. She's had appendectomy.   Patient Active Problem List   Diagnosis Date Noted  . Paresthesia 08/22/2015  . Diabetic peripheral neuropathy associated with type 2 diabetes mellitus ([place]) 08/22/2015  . Tremor, essential 08/22/2015  . A-fib (HCC)--stable recently 07/10/2015  . Paroxysmal a-fib ([place]) 07/10/2015  . Paroxysmal atrial fibrillation ([place]) 03/06/2015  . Chronic anticoagulation 03/06/2015  . Orthostatic hypotension 03/06/2015  . Congestive dilated cardiomyopathy ([place]) 04/19/2014  . Essential hypertension 04/19/2014  . Hyperlipidemia 04/19/2014  . Chronic kidney disease, stage III (moderate) 04/19/2014  . Cardiomyopathy ([place]) 09/02/2013  . Unspecified  hypothyroidism 06/15/2013  . DM type 2 causing CKD stage 3 (HCC)---A1C good 3 mo ago 06/10/2013  . Anemia 06/10/2013  . Left ventricular dysfunction 09/26/2011  . Asthma exacerbation 09/23/2011  . Diabetes mellitus type II 09/22/2011  . HTN (hypertension) 09/22/2011  . HLD (hyperlipidemia) 09/22/2011    Current outpatient prescriptions:  .  acetaminophen (TYLENOL) 500 MG tablet, Take 1,000 mg by mouth every 6 (six) hours as needed for mild pain. , Disp: , Rfl:  .  apixaban (ELIQUIS) 2.5 MG TABS tablet, Take 1 tablet (2.5 mg total) by mouth 2 (two) times daily., Disp: 180 tablet, Rfl: 3 .  atorvastatin (LIPITOR) 40 MG tablet, Take 1 tablet (40 mg total) by mouth daily at 6 PM., Disp: 30 tablet, Rfl: 0 .  Cholecalciferol (VITAMIN D3) 1000 UNITS CAPS, Take 1 capsule by mouth daily., Disp: , Rfl:  .  esomeprazole (NEXIUM) 40 MG capsule, Take 40 mg by mouth 2 (two) times daily.  , Disp: , Rfl:  .  Fe Fum-FePoly-Vit C-Vit B3 (INTEGRA) 62.5-62.5-40-3 MG CAPS, Take 1 capsule by mouth daily., Disp: , Rfl: 3 .  FLUoxetine (PROZAC) 20 MG capsule, Take 20 mg by mouth daily. , Disp: , Rfl:  .  Insulin Aspart (NOVOLOG [place]), Inject 4 Units into the skin 3 (three) times daily. , Disp: , Rfl:  .  Insulin Detemir (LEVEMIR FLEXTOUCH) 100 UNIT/ML Pen, Inject 34 units at bedtime, Disp: 30 mL, Rfl: 1 .  levalbuterol (XOPENEX) 0.63 MG/3ML nebulizer solution, Take 0.63 mg by nebulization every 6 (six)  hours as needed for wheezing or shortness of breath. , Disp: , Rfl: 0 .  levothyroxine (SYNTHROID, LEVOTHROID) 50 MCG tablet, TAKE 1 TABLET BY MOUTH EVERY DAY, Disp: 30 tablet, Rfl: 2 .  Liraglutide (VICTOZA) 18 MG/3ML SOPN, Inject 0.2 mLs (1.2 mg total) into the skin daily., Disp: 6 pen, Rfl: 0 .  losartan (COZAAR) 25 MG tablet, Take 1 tablet (25 mg total) by mouth daily., Disp: 30 tablet, Rfl: 3 .  metoprolol succinate (TOPROL-XL) 50 MG 24 hr tablet, Take 1 tablet (50 mg total) by mouth daily., Disp: 90 tablet, Rfl:  3 .  nitrofurantoin, macrocrystal-monohydrate, (MACROBID) 100 MG capsule, Take 100 mg by mouth 2 (two) times daily., Disp: , Rfl:  .  pramipexole (MIRAPEX) 0.25 MG tablet, Take 1 tablet (0.25 mg total) by mouth at bedtime., Disp: 30 tablet, Rfl: 1 .  pregabalin (LYRICA) 50 MG capsule, Take up to 3 capsules daily as needed, Disp: 270 capsule, Rfl: 1 .  traZODone (DESYREL) 50 MG tablet, Take 1 tablet by mouth daily., Disp: , Rfl: 0 .  AMOXICILLIN PO, Take by mouth., Disp: , Rfl:  .  ipratropium (ATROVENT) 0.06 % nasal spray, Place 2 sprays into both nostrils 4 (four) times daily. (Patient not taking: Reported on 09/26/2015), Disp: 15 mL, Rfl: 1  Review of Systems  Constitutional: Positive for diaphoresis, activity change, appetite change, fatigue and unexpected weight change. Negative for fever.  HENT: Negative for trouble swallowing.   Eyes: Positive for visual disturbance. Negative for photophobia, pain and redness.  Respiratory: Negative for cough, chest tightness and shortness of breath.   Cardiovascular: Negative for chest pain, palpitations and leg swelling.  Gastrointestinal: Positive for nausea and abdominal pain. Negative for vomiting, diarrhea and constipation.  Genitourinary: Negative for difficulty urinating.  Neurological: Positive for dizziness, weakness and headaches. Negative for speech difficulty and numbness.       Objective:   Physical Exam  Constitutional: She is oriented to person, place, and time. She appears well-developed and well-nourished. No distress.  Looks fatigue  HENT:  Head: Normocephalic and atraumatic.  Eyes: EOM are normal. Pupils are equal, round, and reactive to light.  Pupil: right larger than left  Neck: Neck supple. No thyromegaly present.  Cardiovascular: Normal rate, regular rhythm, normal heart sounds and intact distal pulses.   No murmur heard. Pulmonary/Chest: Effort normal and breath sounds normal. No respiratory distress.  Abdominal: She  exhibits no mass. There is no hepatosplenomegaly. There is tenderness. There is no rebound.  Tenderness to deep palpation to the right umbilicus  Musculoskeletal: Normal range of motion. She exhibits no edema.  Lymphadenopathy:    She has no cervical adenopathy.  Neurological: She is alert and oriented to person, place, and time. She has normal reflexes. No cranial nerve deficit. She exhibits normal muscle tone.  Skin: Skin is warm and dry.  Psychiatric: She has a normal mood and affect. Her behavior is normal.  Nursing note and vitals reviewed.   BP 113/68 mmHg  Pulse 91  Temp(Src) 98.1 F (36.7 C) (Oral)  Resp 16  Ht 5\' 5"  (1.651 m)  Wt 161 lb (73.029 kg)  BMI 26.79 kg/m2  SpO2 96%   Wt Readings from Last 3 Encounters:  09/26/15 161 lb (73.029 kg)  08/23/15 167 lb 12.8 oz (76.114 kg)  08/22/15 166 lb (75.297 kg)  10 lbs loss since July 2016  Results for orders placed or performed in visit on 09/26/15  POCT CBC  Result Value Ref Range  WBC 7.3 4.6 - 10.2 K/uL   Lymph, poc 2.2 0.6 - 3.4   POC LYMPH PERCENT 30.2 10 - 50 %L   MID (cbc) 0.5 0 - 0.9   POC MID % 6.4 0 - 12 %M   POC Granulocyte 4.6 2 - 6.9   Granulocyte percent 63.4 37 - 80 %G   RBC 4.63 4.04 - 5.48 M/uL   Hemoglobin 13.1 12.2 - 16.2 g/dL   HCT, POC 38.6 37.7 - 47.9 %   MCV 83.4 80 - 97 fL   MCH, POC 28.4 27 - 31.2 pg   MCHC 34.0 31.8 - 35.4 g/dL   RDW, POC 14.3 %   Platelet Count, POC 251 142 - 424 K/uL   MPV 5.9 0 - 99.8 fL  POCT urinalysis dipstick  Result Value Ref Range   Color, UA yellow yellow   Clarity, UA clear clear   Glucose, UA negative negative   Bilirubin, UA negative negative   Ketones, POC UA negative negative   Spec Grav, UA 1.015    Blood, UA negative negative   pH, UA 6.0    Protein Ur, POC negative negative   Urobilinogen, UA 0.2    Nitrite, UA Negative Negative   Leukocytes, UA Negative Negative  POCT Microscopic Urinalysis ([facility])  Result Value Ref Range   WBC,UR,HPF,POC  Few (A) None WBC/hpf   RBC,UR,HPF,POC Few (A) None RBC/hpf   Bacteria Few (A) None, Too numerous to count   Mucus Absent Absent   Epithelial Cells, UR Per Microscopy Few (A) None, Too numerous to count cells/hpf  POCT glycosylated hemoglobin (Hb A1C)  Result Value Ref Range   Hemoglobin A1C 6.0        Assessment & Plan:   Abdominal pain, periumbilical - Plan: cmet, Amylase  Loss of weight - Plan: Comprehensive metabolic panel, TSH, Amylase  Loss of appetite - Plan: Amylase/cmet  Recurrent UTI - UA wnl  New daily persistent headache--unsure etio  B12 deficiency--now w/ nl Hgb  Type 2 diabetes mellitus with stage 3 chronic kidney disease, with long-term current use of insulin (HCC) - Plan: POCT glycosylated hemoglobin (Hb A1C)  Meds ordered this encounter  Medications  . ondansetron (ZOFRAN) 4 MG tablet    Sig: Take 1 tablet (4 mg total) by mouth every 8 (eight) hours as needed for nausea or vomiting.    Dispense:  20 tablet    Refill:  0   Call w/ results, plan///likely will need imaging  By signing my name below, I, Moises Blood, attest that this documentation has been prepared under the direction and in the presence of Tami Lin, MD. Electronically Signed: Moises Blood, [place]. 09/26/2015 , 5:34 PM .  I have completed the patient encounter in its entirety as documented by the scribe, with editing by me where necessary. Partick Musselman P. Laney Pastor, M.D.

## 2015-09-27 ENCOUNTER — Other Ambulatory Visit: Payer: Self-pay | Admitting: *Deleted

## 2015-09-27 LAB — COMPREHENSIVE METABOLIC PANEL
ALT: 12 U/L (ref 6–29)
AST: 15 U/L (ref 10–35)
Albumin: 4 g/dL (ref 3.6–5.1)
Alkaline Phosphatase: 58 U/L (ref 33–130)
BILIRUBIN TOTAL: 0.2 mg/dL (ref 0.2–1.2)
BUN: 18 mg/dL (ref 7–25)
CALCIUM: 8.9 mg/dL (ref 8.6–10.4)
CHLORIDE: 103 mmol/L (ref 98–110)
CO2: 30 mmol/L (ref 20–31)
Creat: 1.28 mg/dL — ABNORMAL HIGH (ref 0.60–0.88)
GLUCOSE: 104 mg/dL — AB (ref 65–99)
Potassium: 4.5 mmol/L (ref 3.5–5.3)
SODIUM: 141 mmol/L (ref 135–146)
Total Protein: 6.9 g/dL (ref 6.1–8.1)

## 2015-09-27 LAB — AMYLASE: Amylase: 36 U/L (ref 0–105)

## 2015-09-27 LAB — TSH: TSH: 2.879 u[IU]/mL (ref 0.350–4.500)

## 2015-09-27 MED ORDER — LIRAGLUTIDE 18 MG/3ML ~~LOC~~ SOPN: 1.2000 mg | PEN_INJECTOR | Freq: Every day | SUBCUTANEOUS | Status: DC

## 2015-09-28 ENCOUNTER — Encounter: Payer: Self-pay | Admitting: *Deleted

## 2015-09-28 NOTE — Addendum Note (Signed)
Addended by: Leandrew Koyanagi on: 09/28/2015 11:23 AM   Modules accepted: Orders, Medications

## 2015-09-28 NOTE — Addendum Note (Signed)
Addended by: Leandrew Koyanagi on: 09/28/2015 07:25 PM   Modules accepted: Orders

## 2015-10-02 ENCOUNTER — Other Ambulatory Visit: Payer: Self-pay | Admitting: Family Medicine

## 2015-10-02 ENCOUNTER — Ambulatory Visit: Payer: Medicare Other

## 2015-10-02 DIAGNOSIS — N644 Mastodynia: Secondary | ICD-10-CM

## 2015-10-06 ENCOUNTER — Ambulatory Visit
Admission: RE | Admit: 2015-10-06 | Discharge: 2015-10-06 | Disposition: A | Discharge status: Home / Self Care | Payer: Medicare Other | Source: Ambulatory Visit | Attending: Internal Medicine | Admitting: Internal Medicine

## 2015-10-06 DIAGNOSIS — R1033 Periumbilical pain: Secondary | ICD-10-CM

## 2015-10-06 DIAGNOSIS — R630 Anorexia: Secondary | ICD-10-CM

## 2015-10-06 DIAGNOSIS — R109 Unspecified abdominal pain: Secondary | ICD-10-CM | POA: Diagnosis not present

## 2015-10-06 DIAGNOSIS — R634 Abnormal weight loss: Secondary | ICD-10-CM

## 2015-10-06 DIAGNOSIS — N289 Disorder of kidney and ureter, unspecified: Secondary | ICD-10-CM

## 2015-10-12 ENCOUNTER — Other Ambulatory Visit: Payer: Self-pay | Admitting: Neurology

## 2015-10-12 ENCOUNTER — Ambulatory Visit
Admission: RE | Admit: 2015-10-12 | Discharge: 2015-10-12 | Disposition: A | Discharge status: Home / Self Care | Payer: Medicare Other | Source: Ambulatory Visit | Attending: Family Medicine | Admitting: Family Medicine

## 2015-10-12 DIAGNOSIS — N644 Mastodynia: Secondary | ICD-10-CM

## 2015-10-12 DIAGNOSIS — R928 Other abnormal and inconclusive findings on diagnostic imaging of breast: Secondary | ICD-10-CM | POA: Diagnosis not present

## 2015-10-18 ENCOUNTER — Telehealth: Payer: Self-pay | Admitting: *Deleted

## 2015-10-18 ENCOUNTER — Ambulatory Visit (INDEPENDENT_AMBULATORY_CARE_PROVIDER_SITE_OTHER): Payer: Medicare Other | Admitting: *Deleted

## 2015-10-18 ENCOUNTER — Other Ambulatory Visit: Payer: Medicare Other

## 2015-10-18 DIAGNOSIS — E538 Deficiency of other specified B group vitamins: Secondary | ICD-10-CM

## 2015-10-18 MED ORDER — CYANOCOBALAMIN 1000 MCG/ML IJ SOLN
1000.0000 ug | INTRAMUSCULAR | Status: AC
  Administered 2015-10-18 – 2016-04-01 (×6): 1000 ug via INTRAMUSCULAR

## 2015-10-18 MED ORDER — PRAMIPEXOLE DIHYDROCHLORIDE 0.25 MG PO TABS: ORAL_TABLET | ORAL | Status: DC

## 2015-10-18 NOTE — Telephone Encounter (Signed)
Pt in for B12 injection today and wanted to get refill on mirapex sent to [facility],  #90 day supply).

## 2015-10-18 NOTE — Patient Instructions (Signed)
See next month. 

## 2015-10-18 NOTE — Progress Notes (Signed)
Pt here for B 12 injection.  Under aseptic technique cyanocobalamin 1000mcg/1ml IM given L deltoid.  Tolerated well.  Bandaid applied.  

## 2015-10-30 ENCOUNTER — Other Ambulatory Visit: Payer: Medicare Other

## 2015-10-30 ENCOUNTER — Other Ambulatory Visit (INDEPENDENT_AMBULATORY_CARE_PROVIDER_SITE_OTHER): Payer: Medicare Other

## 2015-10-30 DIAGNOSIS — E1142 Type 2 diabetes mellitus with diabetic polyneuropathy: Secondary | ICD-10-CM | POA: Diagnosis not present

## 2015-10-30 LAB — LIPID PANEL
CHOLESTEROL: 133 mg/dL (ref 0–200)
HDL: 38 mg/dL — AB (ref >39.00)
LDL Cholesterol: 62 mg/dL (ref 0–99)
NONHDL: 94.52
TRIGLYCERIDES: 163 mg/dL — AB (ref 0.0–149.0)
Total CHOL/HDL Ratio: 3
VLDL: 32.6 mg/dL (ref 0.0–40.0)

## 2015-10-30 LAB — COMPREHENSIVE METABOLIC PANEL
ALBUMIN: 3.7 g/dL (ref 3.5–5.2)
ALK PHOS: 48 U/L (ref 39–117)
ALT: 10 U/L (ref 0–35)
AST: 15 U/L (ref 0–37)
BILIRUBIN TOTAL: 0.2 mg/dL (ref 0.2–1.2)
BUN: 18 mg/dL (ref 6–23)
CALCIUM: 9 mg/dL (ref 8.4–10.5)
CO2: 28 mEq/L (ref 19–32)
Chloride: 104 mEq/L (ref 96–112)
Creatinine, Ser: 1.14 mg/dL (ref 0.40–1.20)
GFR: 48.08 mL/min — AB (ref >60.00)
Glucose, Bld: 130 mg/dL — ABNORMAL HIGH (ref 70–99)
Potassium: 4.6 mEq/L (ref 3.5–5.1)
Sodium: 139 mEq/L (ref 135–145)
TOTAL PROTEIN: 6.7 g/dL (ref 6.0–8.3)

## 2015-10-30 LAB — HEMOGLOBIN A1C: Hgb A1c MFr Bld: 6.2 % (ref 4.6–6.5)

## 2015-11-02 ENCOUNTER — Encounter: Payer: Self-pay | Admitting: Endocrinology

## 2015-11-02 ENCOUNTER — Ambulatory Visit: Payer: Medicare Other | Admitting: Endocrinology

## 2015-11-02 ENCOUNTER — Ambulatory Visit (INDEPENDENT_AMBULATORY_CARE_PROVIDER_SITE_OTHER): Payer: Medicare Other | Admitting: Endocrinology

## 2015-11-02 VITALS — BP 150/76 | HR 84 | Temp 97.6°F | Resp 16 | Ht 65.0 in | Wt 161.0 lb

## 2015-11-02 DIAGNOSIS — R000 Tachycardia, unspecified: Secondary | ICD-10-CM

## 2015-11-02 DIAGNOSIS — E1165 Type 2 diabetes mellitus with hyperglycemia: Secondary | ICD-10-CM

## 2015-11-02 DIAGNOSIS — E063 Autoimmune thyroiditis: Secondary | ICD-10-CM

## 2015-11-02 DIAGNOSIS — Z794 Long term (current) use of insulin: Secondary | ICD-10-CM | POA: Diagnosis not present

## 2015-11-02 DIAGNOSIS — E038 Other specified hypothyroidism: Secondary | ICD-10-CM | POA: Diagnosis not present

## 2015-11-02 NOTE — Progress Notes (Signed)
Patient ID: Melissa [name], female   DOB: 08/21/1928, 82 y.o.   MRN: [id number]   Reason for Appointment: Diabetes follow-up   History of Present Illness   Diagnosis: Type 2 DIABETES MELITUS     The patient has been on a multidrug regimen and previously had poor control with A1c as high as 9.8% in 2013 Her blood sugars had improved with adding Victoza as well as mealtime insulin To her regimen of Levemir Her blood sugars have been fairly good with adding Victoza and has been able to maintain her weight better also She has been able to keep up with her multi-injection regimen without problems and gets help from family member  Recent history:   Insulin regimen: LEVEMIR 28 in am    Her A1c is significantly better at 6.2%, was 7% previously She previously had gained weight and has lost more weight since her last visit  Current management, blood sugar patterns and problems identified:  She has stopped taking her Novolog.  She thinks she stopped it because she felt she did not need it and may have had a couple of low sugars, does not know when she stopped it  She then switched her Levemir to the morning instead of evening FASTING blood sugars are relatively higher and blood sugars are somewhat lowered the rest of the day, not higher after meals usually except after breakfast occasionally   Oral hypoglycemic drugs: None        Side effects from medications: None Proper timing of medications in relation to meals: Yes.          Monitors blood glucose:  2-3 times a day.    Glucometer: One Touch.          Blood Glucose readings from meter download:   Mean values apply above for all meters except median for One Touch  PRE-MEAL Fasting Lunch Dinner Bedtime Overall  Glucose range:  108-172   113-208   85-147   95-148    Mean/median:  142   139    134  137   Hypoglycemia: None          Meals:  She  may have a protein in the form of an egg or peanut butter at breakfast but  sometimes only eating a muffin, eating supper at 5-6 PM; breakfast is at 9-10 am     Physical activity: exercise: not able to walk much because of balance or dypnea           Dietician visit: Most recent: Unknown            Wt Readings from Last 3 Encounters:  11/02/15 161 lb (73.029 kg)  09/26/15 161 lb (73.029 kg)  08/23/15 167 lb 12.8 oz (76.114 kg)   Lab Results  Component Value Date   HGBA1C 6.2 10/30/2015   HGBA1C 6.0 09/26/2015   HGBA1C 7.0* 07/11/2015   Lab Results  Component Value Date   MICROALBUR 1.4 01/12/2015   LDLCALC 62 10/30/2015   CREATININE 1.14 10/30/2015    Lab on 10/30/2015  Component Date Value Ref Range Status  . Hgb A1c MFr Bld 10/30/2015 6.2  4.6 - 6.5 % Final   Glycemic Control Guidelines for People with Diabetes:Non Diabetic:  <6%Goal of Therapy: <7%Additional Action Suggested:  >8%   . Sodium 10/30/2015 139  135 - 145 mEq/L Final  . Potassium 10/30/2015 4.6  3.5 - 5.1 mEq/L Final  . Chloride 10/30/2015 104  96 - 112 mEq/L  Final  . CO2 10/30/2015 28  19 - 32 mEq/L Final  . Glucose, Bld 10/30/2015 130* 70 - 99 mg/dL Final  . BUN 10/30/2015 18  6 - 23 mg/dL Final  . Creatinine, Ser 10/30/2015 1.14  0.40 - 1.20 mg/dL Final  . Total Bilirubin 10/30/2015 0.2  0.2 - 1.2 mg/dL Final  . Alkaline Phosphatase 10/30/2015 48  39 - 117 U/L Final  . AST 10/30/2015 15  0 - 37 U/L Final  . ALT 10/30/2015 10  0 - 35 U/L Final  . Total Protein 10/30/2015 6.7  6.0 - 8.3 g/dL Final  . Albumin 10/30/2015 3.7  3.5 - 5.2 g/dL Final  . Calcium 10/30/2015 9.0  8.4 - 10.5 mg/dL Final  . GFR 10/30/2015 48.08* >60.00 mL/min Final  . Cholesterol 10/30/2015 133  0 - 200 mg/dL Final   ATP III Classification       Desirable:  < 200 mg/dL               Borderline High:  200 - 239 mg/dL          High:  > = 240 mg/dL  . Triglycerides 10/30/2015 163.0* 0.0 - 149.0 mg/dL Final   Normal:  <150 mg/dLBorderline High:  150 - 199 mg/dL  . HDL 10/30/2015 38.00* >39.00 mg/dL Final  .  VLDL 10/30/2015 32.6  0.0 - 40.0 mg/dL Final  . LDL Cholesterol 10/30/2015 62  0 - 99 mg/dL Final  . Total CHOL/HDL Ratio 10/30/2015 3   Final                  Men          Women1/2 Average Risk     3.4          3.3Average Risk          5.0          4.42X Average Risk          9.6          7.13X Average Risk          15.0          11.0                      . NonHDL 10/30/2015 94.52   Final   NOTE:  Non-HDL goal should be 30 mg/dL higher than patient's LDL goal (i.e. LDL goal of < 70 mg/dL, would have non-HDL goal of < 100 mg/dL)       Medication List       This list is accurate as of: 11/02/15  4:53 PM.  Always use your most recent med list.               acetaminophen 500 MG tablet  Commonly known as:  TYLENOL  Take 1,000 mg by mouth every 6 (six) hours as needed for mild pain. Reported on 11/02/2015     AMOXICILLIN PO  Take by mouth. Reported on 11/02/2015     apixaban 2.5 MG Tabs tablet  Commonly known as:  ELIQUIS  Take 1 tablet (2.5 mg total) by mouth 2 (two) times daily.     atorvastatin 40 MG tablet  Commonly known as:  LIPITOR  Take 1 tablet (40 mg total) by mouth daily at 6 PM.     esomeprazole 40 MG capsule  Commonly known as:  NEXIUM  Take 40 mg by mouth 2 (two) times daily.     FLUoxetine  20 MG capsule  Commonly known as:  PROZAC  Take 20 mg by mouth daily.     Insulin Detemir 100 UNIT/ML Pen  Commonly known as:  LEVEMIR FLEXTOUCH  Inject 34 units at bedtime     INTEGRA 62.5-62.5-40-3 MG Caps  Take 1 capsule by mouth daily.     ipratropium 0.06 % nasal spray  Commonly known as:  ATROVENT  Place 2 sprays into both nostrils 4 (four) times daily.     levalbuterol 0.63 MG/3ML nebulizer solution  Commonly known as:  XOPENEX  Take 0.63 mg by nebulization every 6 (six) hours as needed for wheezing or shortness of breath.     levothyroxine 50 MCG tablet  Commonly known as:  SYNTHROID, LEVOTHROID  TAKE 1 TABLET BY MOUTH EVERY DAY     Liraglutide 18 MG/3ML  Sopn  Commonly known as:  VICTOZA  Inject 0.2 mLs (1.2 mg total) into the skin daily.     losartan 25 MG tablet  Commonly known as:  COZAAR  Take 1 tablet (25 mg total) by mouth daily.     metoprolol succinate 50 MG 24 hr tablet  Commonly known as:  TOPROL XL  Take 1 tablet (50 mg total) by mouth daily.     nitrofurantoin (macrocrystal-monohydrate) 100 MG capsule  Commonly known as:  MACROBID  Take 100 mg by mouth 2 (two) times daily.     NOVOLOG [place]  Inject 4 Units into the skin 3 (three) times daily.     ondansetron 4 MG tablet  Commonly known as:  ZOFRAN  Take 1 tablet (4 mg total) by mouth every 8 (eight) hours as needed for nausea or vomiting.     ONE TOUCH ULTRA TEST test strip  Generic drug:  glucose blood  CHECK BLOOD SUGAR 4 TIMES DAILY     pramipexole 0.25 MG tablet  Commonly known as:  MIRAPEX  TAKE 1 TABLET (0.25 MG TOTAL) BY MOUTH AT BEDTIME.     pregabalin 50 MG capsule  Commonly known as:  LYRICA  Take up to 3 capsules daily as needed     traZODone 50 MG tablet  Commonly known as:  DESYREL  Take 1 tablet by mouth daily.     Vitamin D3 1000 units Caps  Take 1 capsule by mouth daily.        Allergies: No Known Allergies  Past Medical History  Diagnosis Date  . Asthma   . Sleep apnea   . Angina   . Hypertension   . Blood transfusion   . GERD (gastroesophageal reflux disease)   . Headache(784.0)   . Arthritis   . Anxiety   . Pneumonia   . Diabetes mellitus   . Anemia   . Hiatal hernia   . Acute kidney injury ([place]) 09/26/2011  . High cholesterol   . Unspecified hypothyroidism 06/15/2013  . Cardiomyopathy ([place]) 09/02/2013    EF 45%, 12/12  . Paresthesia 08/22/2015  . Diabetic peripheral neuropathy associated with type 2 diabetes mellitus ([place]) 08/22/2015  . Diabetic peripheral neuropathy associated with type 2 diabetes mellitus ([place]) 08/22/2015  . Tremor, essential 08/22/2015    Past Surgical History  Procedure Laterality Date  . Abdominal  hysterectomy    . Appendectomy    . Eye surgery    . Tonsillectomy    . Breast surgery      Family History  Problem Relation Age of Onset  . Stroke Mother   . Heart attack Mother   . Heart  disease Father   . Cancer Father     Social History:  reports that she quit smoking about 54 years ago. She has never used smokeless tobacco. She reports that she does not drink alcohol or use illicit drugs.  Review of Systems:  She is asking about persistent cough.  Has seen her PCP last month.  Not improving with OTC cough medications.  No fever   Renal insufficiency: She  had been followed by a nephrologist for renal insufficiency previously Her creatinine was significantly higher at 1.8 but has progressively improved and is back to normal now   Lab Results  Component Value Date   CREATININE 1.14 10/30/2015   CREATININE 1.28* 09/26/2015   CREATININE 1.20* 07/13/2015    HYPERTENSION:  blood pressure is previously mild and controlled with metoprolol followed by cardiologist but Blood pressure is higher than usual today, was normal last month   Cardiac history: She may have had heart failure during a hospitalization for pneumonia and ejection fraction previously was 45%.  Not on diuretics  HYPERLIPIDEMIA: The lipid abnormality consists of elevated LDL treated with Lipitor. Has had high triglycerides  Last LDL about 100  Lab Results  Component Value Date   CHOL 133 10/30/2015   HDL 38.00* 10/30/2015   LDLCALC 62 10/30/2015   LDLDIRECT 100.8 09/13/2014   TRIG 163.0* 10/30/2015   CHOLHDL 3 10/30/2015    Has had numbness in her feet and also has parasthesiae, gabapentin not tolerated because of  ? Palpitations Her symptoms are better with Lyrica 50 mg taken bid She is asking about crawling symptoms in her back especially at bedtime  Also she has mild hypothyroidism since 5/14 treated with 50 mcg of levothyroxine with adequate control, TSH as follows: Her baseline TSH was 5.3.     Because of relatively higher TSH in 12/15 she is now taking 50 g alternating with 75 and her TSH is consistently normal   Lab Results  Component Value Date   TSH 2.879 09/26/2015   TSH 2.76 06/28/2015   TSH 2.41 01/12/2015   FREET4 0.85 01/12/2015   FREET4 0.86 03/10/2014   FREET4 0.95 10/18/2013    No complaints of decreased appetite  Diabetic foot exam done in 4/16      Examination:   BP 150/76 mmHg  Pulse 84  Temp(Src) 97.6 F (36.4 C)  Resp 16  Ht 5\' 5"  (1.651 m)  Wt 161 lb (73.029 kg)  BMI 26.79 kg/m2  SpO2 94%  Body mass index is 26.79 kg/(m^2).    Lungs clear  No ankle edema  ASSESSMENT/ PLAN:   Diabetes type 2   The patient's blood sugars are more easy to control because of her weight loss See history of present illness for detailed discussion of his current management, blood sugar patterns and problems identified On her own she started taking Levemir in the morning instead of at night and this is causing higher fasting readings Appears to have good control of mealtime hyperglycemia with taking her Victoza and not needing any more Novolog  A1c is excellent at 6.2 No hypoglycemia  She is fairly compliant with checking her blood sugars and also insulin  Insulin changes: Reduce Levemir by 2 units but take it in the evenings after supper May take Victoza at the same time  Respiratory symptoms: Likely to be post viral cough.  She can discuss a new  cough medication with PCP  HYPERTENSION: Blood pressure is relatively higher than usual without any decongestants.  Advised her to monitor at home and follow-up with PCP and cardiologist  Renal insufficiency: Creatinine has improved  Hypothyroidism: TSH is consistently normal recently    Patient Instructions  Change Levemir to 26 at night  Check BP    Counseling time on subjects discussed above is over 50% of today's 25 minute visit  Evaristo Tsuda 11/02/2015, 4:53 PM

## 2015-11-02 NOTE — Patient Instructions (Signed)
Change Levemir to 26 at night  Check BP

## 2015-11-03 ENCOUNTER — Other Ambulatory Visit: Payer: Self-pay | Admitting: Gastroenterology

## 2015-11-03 DIAGNOSIS — R1084 Generalized abdominal pain: Secondary | ICD-10-CM

## 2015-11-03 DIAGNOSIS — R109 Unspecified abdominal pain: Secondary | ICD-10-CM | POA: Diagnosis not present

## 2015-11-03 DIAGNOSIS — R634 Abnormal weight loss: Secondary | ICD-10-CM | POA: Diagnosis not present

## 2015-11-03 DIAGNOSIS — R112 Nausea with vomiting, unspecified: Secondary | ICD-10-CM

## 2015-11-03 DIAGNOSIS — R110 Nausea: Secondary | ICD-10-CM | POA: Diagnosis not present

## 2015-11-09 ENCOUNTER — Ambulatory Visit
Admission: RE | Admit: 2015-11-09 | Discharge: 2015-11-09 | Disposition: A | Discharge status: Home / Self Care | Payer: Medicare Other | Source: Ambulatory Visit | Attending: Gastroenterology | Admitting: Gastroenterology

## 2015-11-09 DIAGNOSIS — R634 Abnormal weight loss: Secondary | ICD-10-CM

## 2015-11-09 DIAGNOSIS — K8020 Calculus of gallbladder without cholecystitis without obstruction: Secondary | ICD-10-CM | POA: Diagnosis not present

## 2015-11-09 DIAGNOSIS — R1084 Generalized abdominal pain: Secondary | ICD-10-CM

## 2015-11-09 DIAGNOSIS — N281 Cyst of kidney, acquired: Secondary | ICD-10-CM | POA: Diagnosis not present

## 2015-11-09 DIAGNOSIS — R112 Nausea with vomiting, unspecified: Secondary | ICD-10-CM

## 2015-11-09 MED ORDER — IOPAMIDOL (ISOVUE-300) INJECTION 61%
100.0000 mL | Freq: Once | INTRAVENOUS | Status: AC | PRN
  Administered 2015-11-09: 100 mL via INTRAVENOUS

## 2015-11-14 ENCOUNTER — Emergency Department (HOSPITAL_COMMUNITY): Payer: Medicare Other

## 2015-11-14 ENCOUNTER — Observation Stay (HOSPITAL_COMMUNITY): Payer: Medicare Other

## 2015-11-14 ENCOUNTER — Observation Stay (HOSPITAL_COMMUNITY)
Admission: EM | Admit: 2015-11-14 | Discharge: 2015-11-16 | Disposition: A | Discharge status: Home / Self Care | Payer: Medicare Other | Source: Intra-hospital | Attending: Internal Medicine | Admitting: Internal Medicine

## 2015-11-14 ENCOUNTER — Encounter (HOSPITAL_COMMUNITY): Payer: Self-pay | Admitting: Emergency Medicine

## 2015-11-14 DIAGNOSIS — E785 Hyperlipidemia, unspecified: Secondary | ICD-10-CM | POA: Diagnosis not present

## 2015-11-14 DIAGNOSIS — R29818 Other symptoms and signs involving the nervous system: Secondary | ICD-10-CM | POA: Diagnosis not present

## 2015-11-14 DIAGNOSIS — Z66 Do not resuscitate: Secondary | ICD-10-CM | POA: Insufficient documentation

## 2015-11-14 DIAGNOSIS — R079 Chest pain, unspecified: Secondary | ICD-10-CM | POA: Diagnosis present

## 2015-11-14 DIAGNOSIS — I6789 Other cerebrovascular disease: Secondary | ICD-10-CM | POA: Diagnosis not present

## 2015-11-14 DIAGNOSIS — I480 Paroxysmal atrial fibrillation: Secondary | ICD-10-CM | POA: Diagnosis not present

## 2015-11-14 DIAGNOSIS — R200 Anesthesia of skin: Secondary | ICD-10-CM | POA: Insufficient documentation

## 2015-11-14 DIAGNOSIS — Z8673 Personal history of transient ischemic attack (TIA), and cerebral infarction without residual deficits: Secondary | ICD-10-CM | POA: Diagnosis not present

## 2015-11-14 DIAGNOSIS — Z7901 Long term (current) use of anticoagulants: Secondary | ICD-10-CM | POA: Insufficient documentation

## 2015-11-14 DIAGNOSIS — E538 Deficiency of other specified B group vitamins: Secondary | ICD-10-CM

## 2015-11-14 DIAGNOSIS — R208 Other disturbances of skin sensation: Secondary | ICD-10-CM | POA: Diagnosis not present

## 2015-11-14 DIAGNOSIS — E1122 Type 2 diabetes mellitus with diabetic chronic kidney disease: Secondary | ICD-10-CM | POA: Diagnosis not present

## 2015-11-14 DIAGNOSIS — G459 Transient cerebral ischemic attack, unspecified: Secondary | ICD-10-CM

## 2015-11-14 DIAGNOSIS — N183 Chronic kidney disease, stage 3 unspecified: Secondary | ICD-10-CM | POA: Diagnosis present

## 2015-11-14 DIAGNOSIS — J019 Acute sinusitis, unspecified: Secondary | ICD-10-CM | POA: Diagnosis not present

## 2015-11-14 DIAGNOSIS — I429 Cardiomyopathy, unspecified: Secondary | ICD-10-CM | POA: Diagnosis not present

## 2015-11-14 DIAGNOSIS — I519 Heart disease, unspecified: Secondary | ICD-10-CM | POA: Diagnosis present

## 2015-11-14 DIAGNOSIS — Z87891 Personal history of nicotine dependence: Secondary | ICD-10-CM | POA: Diagnosis not present

## 2015-11-14 DIAGNOSIS — I129 Hypertensive chronic kidney disease with stage 1 through stage 4 chronic kidney disease, or unspecified chronic kidney disease: Secondary | ICD-10-CM | POA: Diagnosis not present

## 2015-11-14 DIAGNOSIS — E1142 Type 2 diabetes mellitus with diabetic polyneuropathy: Secondary | ICD-10-CM | POA: Insufficient documentation

## 2015-11-14 DIAGNOSIS — Z794 Long term (current) use of insulin: Secondary | ICD-10-CM | POA: Insufficient documentation

## 2015-11-14 DIAGNOSIS — R0789 Other chest pain: Principal | ICD-10-CM | POA: Insufficient documentation

## 2015-11-14 DIAGNOSIS — I10 Essential (primary) hypertension: Secondary | ICD-10-CM | POA: Diagnosis present

## 2015-11-14 DIAGNOSIS — E1140 Type 2 diabetes mellitus with diabetic neuropathy, unspecified: Secondary | ICD-10-CM | POA: Diagnosis present

## 2015-11-14 DIAGNOSIS — E039 Hypothyroidism, unspecified: Secondary | ICD-10-CM | POA: Insufficient documentation

## 2015-11-14 DIAGNOSIS — I6782 Cerebral ischemia: Secondary | ICD-10-CM | POA: Diagnosis not present

## 2015-11-14 LAB — URINALYSIS, ROUTINE W REFLEX MICROSCOPIC
Bilirubin Urine: NEGATIVE
GLUCOSE, UA: NEGATIVE mg/dL
KETONES UR: NEGATIVE mg/dL
NITRITE: NEGATIVE
PH: 6.5 (ref 5.0–8.0)
Protein, ur: NEGATIVE mg/dL
SPECIFIC GRAVITY, URINE: 1.015 (ref 1.005–1.030)

## 2015-11-14 LAB — CBC
HEMATOCRIT: 35.2 % — AB (ref 36.0–46.0)
HEMOGLOBIN: 11.2 g/dL — AB (ref 12.0–15.0)
MCH: 27.7 pg (ref 26.0–34.0)
MCHC: 31.8 g/dL (ref 30.0–36.0)
MCV: 87.1 fL (ref 78.0–100.0)
Platelets: 229 10*3/uL (ref 150–400)
RBC: 4.04 MIL/uL (ref 3.87–5.11)
RDW: 14.1 % (ref 11.5–15.5)
WBC: 8.7 10*3/uL (ref 4.0–10.5)

## 2015-11-14 LAB — DIFFERENTIAL
BASOS PCT: 1 %
Basophils Absolute: 0 10*3/uL (ref 0.0–0.1)
Eosinophils Absolute: 0.2 10*3/uL (ref 0.0–0.7)
Eosinophils Relative: 2 %
LYMPHS ABS: 1.9 10*3/uL (ref 0.7–4.0)
LYMPHS PCT: 22 %
MONOS PCT: 8 %
Monocytes Absolute: 0.7 10*3/uL (ref 0.1–1.0)
NEUTROS ABS: 6 10*3/uL (ref 1.7–7.7)
Neutrophils Relative %: 67 %

## 2015-11-14 LAB — I-STAT CHEM 8, ED
BUN: 19 mg/dL (ref 6–20)
CALCIUM ION: 1.04 mmol/L — AB (ref 1.13–1.30)
Chloride: 98 mmol/L — ABNORMAL LOW (ref 101–111)
Creatinine, Ser: 1.3 mg/dL — ABNORMAL HIGH (ref 0.44–1.00)
GLUCOSE: 110 mg/dL — AB (ref 65–99)
HCT: 36 % (ref 36.0–46.0)
HEMOGLOBIN: 12.2 g/dL (ref 12.0–15.0)
POTASSIUM: 4.2 mmol/L (ref 3.5–5.1)
Sodium: 136 mmol/L (ref 135–145)
TCO2: 25 mmol/L (ref 0–100)

## 2015-11-14 LAB — COMPREHENSIVE METABOLIC PANEL
ALK PHOS: 51 U/L (ref 38–126)
ALT: 13 U/L — ABNORMAL LOW (ref 14–54)
ANION GAP: 9 (ref 5–15)
AST: 16 U/L (ref 15–41)
Albumin: 3.6 g/dL (ref 3.5–5.0)
BUN: 16 mg/dL (ref 6–20)
CO2: 27 mmol/L (ref 22–32)
Calcium: 8.9 mg/dL (ref 8.9–10.3)
Chloride: 100 mmol/L — ABNORMAL LOW (ref 101–111)
Creatinine, Ser: 1.32 mg/dL — ABNORMAL HIGH (ref 0.44–1.00)
GFR, EST AFRICAN AMERICAN: 41 mL/min — AB (ref >60)
GFR, EST NON AFRICAN AMERICAN: 36 mL/min — AB (ref >60)
GLUCOSE: 114 mg/dL — AB (ref 65–99)
POTASSIUM: 4.3 mmol/L (ref 3.5–5.1)
Sodium: 136 mmol/L (ref 135–145)
TOTAL PROTEIN: 6.7 g/dL (ref 6.5–8.1)
Total Bilirubin: 0.2 mg/dL — ABNORMAL LOW (ref 0.3–1.2)

## 2015-11-14 LAB — TROPONIN I: Troponin I: <0.03 ng/mL (ref <0.031)

## 2015-11-14 LAB — URINE MICROSCOPIC-ADD ON

## 2015-11-14 LAB — RAPID URINE DRUG SCREEN, HOSP PERFORMED
Amphetamines: NOT DETECTED
Barbiturates: NOT DETECTED
Benzodiazepines: NOT DETECTED
COCAINE: NOT DETECTED
OPIATES: NOT DETECTED
TETRAHYDROCANNABINOL: NOT DETECTED

## 2015-11-14 LAB — I-STAT TROPONIN, ED: Troponin i, poc: 0 ng/mL (ref 0.00–0.08)

## 2015-11-14 LAB — PROTIME-INR
INR: 1.11 (ref 0.00–1.49)
Prothrombin Time: 14.5 seconds (ref 11.6–15.2)

## 2015-11-14 LAB — ETHANOL

## 2015-11-14 LAB — GLUCOSE, CAPILLARY: Glucose-Capillary: 88 mg/dL (ref 65–99)

## 2015-11-14 LAB — APTT: APTT: 36 s (ref 24–37)

## 2015-11-14 MED ORDER — FE FUMARATE-B12-VIT C-FA-IFC PO CAPS
1.0000 | ORAL_CAPSULE | Freq: Every day | ORAL | Status: DC
  Administered 2015-11-15 – 2015-11-16 (×2): 1 via ORAL
  Filled 2015-11-14 (×3): qty 1

## 2015-11-14 MED ORDER — APIXABAN 2.5 MG PO TABS
2.5000 mg | ORAL_TABLET | Freq: Two times a day (BID) | ORAL | Status: DC
  Administered 2015-11-14 – 2015-11-16 (×4): 2.5 mg via ORAL
  Filled 2015-11-14 (×4): qty 1

## 2015-11-14 MED ORDER — ASPIRIN 81 MG PO CHEW
324.0000 mg | CHEWABLE_TABLET | Freq: Once | ORAL | Status: AC
  Administered 2015-11-14: 324 mg via ORAL
  Filled 2015-11-14: qty 4

## 2015-11-14 MED ORDER — ENSURE ENLIVE PO LIQD
237.0000 mL | Freq: Two times a day (BID) | ORAL | Status: DC
  Administered 2015-11-16: 237 mL via ORAL

## 2015-11-14 MED ORDER — VITAMIN D 1000 UNITS PO TABS
1000.0000 [IU] | ORAL_TABLET | Freq: Every day | ORAL | Status: DC
  Administered 2015-11-15 – 2015-11-16 (×2): 1000 [IU] via ORAL
  Filled 2015-11-14 (×2): qty 1

## 2015-11-14 MED ORDER — LEVALBUTEROL HCL 0.63 MG/3ML IN NEBU: 0.6300 mg | INHALATION_SOLUTION | Freq: Four times a day (QID) | RESPIRATORY_TRACT | Status: DC | PRN

## 2015-11-14 MED ORDER — STROKE: EARLY STAGES OF RECOVERY BOOK
Freq: Once | Status: AC
  Administered 2015-11-14: 23:00:00
  Filled 2015-11-14: qty 1

## 2015-11-14 MED ORDER — ALBUTEROL SULFATE (2.5 MG/3ML) 0.083% IN NEBU: 2.5000 mg | INHALATION_SOLUTION | Freq: Four times a day (QID) | RESPIRATORY_TRACT | Status: DC | PRN

## 2015-11-14 MED ORDER — INSULIN ASPART 100 UNIT/ML ~~LOC~~ SOLN
0.0000 [IU] | Freq: Three times a day (TID) | SUBCUTANEOUS | Status: DC
  Administered 2015-11-15 – 2015-11-16 (×3): 1 [IU] via SUBCUTANEOUS

## 2015-11-14 MED ORDER — PREGABALIN 50 MG PO CAPS
50.0000 mg | ORAL_CAPSULE | Freq: Two times a day (BID) | ORAL | Status: DC
  Administered 2015-11-14 – 2015-11-16 (×4): 50 mg via ORAL
  Filled 2015-11-14 (×4): qty 1

## 2015-11-14 MED ORDER — LOSARTAN POTASSIUM 25 MG PO TABS
25.0000 mg | ORAL_TABLET | Freq: Every day | ORAL | Status: DC
  Administered 2015-11-15 – 2015-11-16 (×2): 25 mg via ORAL
  Filled 2015-11-14 (×2): qty 1

## 2015-11-14 MED ORDER — PRAMIPEXOLE DIHYDROCHLORIDE 0.25 MG PO TABS
0.2500 mg | ORAL_TABLET | Freq: Every day | ORAL | Status: DC
  Administered 2015-11-14 – 2015-11-15 (×2): 0.25 mg via ORAL
  Filled 2015-11-14 (×3): qty 1

## 2015-11-14 MED ORDER — METOPROLOL SUCCINATE ER 50 MG PO TB24
50.0000 mg | ORAL_TABLET | Freq: Every day | ORAL | Status: DC
Start: 2015-11-15 — End: 2015-11-16
  Administered 2015-11-15 – 2015-11-16 (×2): 50 mg via ORAL
  Filled 2015-11-14 (×3): qty 1

## 2015-11-14 MED ORDER — LEVOTHYROXINE SODIUM 50 MCG PO TABS
50.0000 ug | ORAL_TABLET | Freq: Every day | ORAL | Status: DC
  Administered 2015-11-15 – 2015-11-16 (×2): 50 ug via ORAL
  Filled 2015-11-14 (×2): qty 1

## 2015-11-14 MED ORDER — INSULIN DETEMIR 100 UNIT/ML ~~LOC~~ SOLN
15.0000 [IU] | Freq: Every day | SUBCUTANEOUS | Status: DC
  Administered 2015-11-14 – 2015-11-15 (×2): 15 [IU] via SUBCUTANEOUS
  Filled 2015-11-14 (×3): qty 0.15

## 2015-11-14 MED ORDER — INTEGRA 62.5-62.5-40-3 MG PO CAPS: 1.0000 | ORAL_CAPSULE | Freq: Every day | ORAL | Status: DC

## 2015-11-14 MED ORDER — TRAZODONE HCL 50 MG PO TABS
50.0000 mg | ORAL_TABLET | Freq: Every day | ORAL | Status: DC
  Administered 2015-11-14 – 2015-11-15 (×2): 50 mg via ORAL
  Filled 2015-11-14 (×2): qty 1

## 2015-11-14 MED ORDER — IOHEXOL 350 MG/ML SOLN
80.0000 mL | Freq: Once | INTRAVENOUS | Status: AC | PRN
  Administered 2015-11-14: 80 mL via INTRAVENOUS

## 2015-11-14 MED ORDER — SENNOSIDES-DOCUSATE SODIUM 8.6-50 MG PO TABS: 1.0000 | ORAL_TABLET | Freq: Every evening | ORAL | Status: DC | PRN

## 2015-11-14 MED ORDER — FLUOXETINE HCL 20 MG PO CAPS
20.0000 mg | ORAL_CAPSULE | Freq: Every day | ORAL | Status: DC
  Administered 2015-11-15 – 2015-11-16 (×2): 20 mg via ORAL
  Filled 2015-11-14 (×2): qty 1

## 2015-11-14 MED ORDER — IPRATROPIUM BROMIDE 0.06 % NA SOLN
2.0000 | Freq: Four times a day (QID) | NASAL | Status: DC | PRN
Start: 2015-11-14 — End: 2015-11-16
  Filled 2015-11-14: qty 15

## 2015-11-14 MED ORDER — PANTOPRAZOLE SODIUM 40 MG PO TBEC
40.0000 mg | DELAYED_RELEASE_TABLET | Freq: Every day | ORAL | Status: DC
  Administered 2015-11-15 – 2015-11-16 (×2): 40 mg via ORAL
  Filled 2015-11-14 (×2): qty 1

## 2015-11-14 MED ORDER — LIRAGLUTIDE 18 MG/3ML ~~LOC~~ SOPN: 1.2000 mg | PEN_INJECTOR | Freq: Every day | SUBCUTANEOUS | Status: DC

## 2015-11-14 MED ORDER — ATORVASTATIN CALCIUM 40 MG PO TABS
40.0000 mg | ORAL_TABLET | Freq: Every day | ORAL | Status: DC
Start: 2015-11-15 — End: 2015-11-16
  Administered 2015-11-15: 40 mg via ORAL
  Filled 2015-11-14: qty 1

## 2015-11-14 MED ORDER — INSULIN DETEMIR 100 UNIT/ML FLEXPEN: 15.0000 [IU] | PEN_INJECTOR | Freq: Every day | SUBCUTANEOUS | Status: DC

## 2015-11-14 MED ORDER — CYANOCOBALAMIN 1000 MCG/ML IJ SOLN
1000.0000 ug | INTRAMUSCULAR | Status: DC
Start: 2015-11-14 — End: 2015-11-14

## 2015-11-14 NOTE — ED Notes (Signed)
Dr. Kirkpatrick at bedside 

## 2015-11-14 NOTE — Code Documentation (Signed)
82yo female arriving to [facility] via [facility] at 1556.  EMS reports sudden onset stabbing pain below her left breast followed by left face and arm numbness at 1500.  Patient with h/o MI with similar symptoms.  Code stroke activated by EMS.  Stroke team at the bedside on patient arrival.  Labs drawn and patient cleared for CT by Dr. Rogene Houston.  Patient to CT.  NIHSS 0, see documentation for details and code stroke times.  Patient continues to report subjective numbness in the left face but the left arm numbness subsided.  Patient is on Eliquis with last dose today.  Patient is contraindicated for tPA d/t taking Eliquis.  No acute stroke treatment at this time. Bedside handoff with ED RN Vikki Ports.

## 2015-11-14 NOTE — Progress Notes (Signed)
[facility] admissions paged through [place] to notify patient had arrived to unit.

## 2015-11-14 NOTE — ED Notes (Signed)
Patient transported to CT 

## 2015-11-14 NOTE — Consult Note (Signed)
Neurology Consultation Reason for Consult: Left-sided numbness Referring Physician: Lyn Hollingshead  CC: Left-sided numbness  History is obtained from: Patient  HPI: Melissa [name] is a 82 y.o. female with a history of atrial fibrillation on apixaban who presents with chest pain as well as left-sided numbness. She states that the left-sided numbness started around 3:00 and is rapidly improving. She also has been having even before this chest pain just below her left breast, occasionally radiating to her back. This is still coming intermittently, but the numbness is improving. She denies weakness.   LKW: 3 PM tpa given?: no, rapidly improving symptoms, apixaban    ROS: A 14 point ROS was performed and is negative except as noted in the HPI.   Past Medical History  Diagnosis Date  . Asthma   . Sleep apnea   . Angina   . Hypertension   . Blood transfusion   . GERD (gastroesophageal reflux disease)   . Headache(784.0)   . Arthritis   . Anxiety   . Pneumonia   . Diabetes mellitus   . Anemia   . Hiatal hernia   . Acute kidney injury ([place]) 09/26/2011  . High cholesterol   . Unspecified hypothyroidism 06/15/2013  . Cardiomyopathy ([place]) 09/02/2013    EF 45%, 12/12  . Paresthesia 08/22/2015  . Diabetic peripheral neuropathy associated with type 2 diabetes mellitus ([place]) 08/22/2015  . Diabetic peripheral neuropathy associated with type 2 diabetes mellitus ([place]) 08/22/2015  . Tremor, essential 08/22/2015     Family History  Problem Relation Age of Onset  . Stroke Mother   . Heart attack Mother   . Heart disease Father   . Cancer Father      Social History:  reports that she quit smoking about 54 years ago. She has never used smokeless tobacco. She reports that she does not drink alcohol or use illicit drugs.   Exam: Current vital signs: BP 152/71 mmHg  Pulse 87  Temp(Src) 98 F (36.7 C) (Oral)  Resp 17  Ht 5\' 5"  (1.651 m)  Wt 73.573 kg (162 lb 3.2 oz)  BMI 26.99 kg/m2   SpO2 95% Vital signs in last 24 hours: Temp:  [98 F (36.7 C)] 98 F (36.7 C) (01/31 1619) Pulse Rate:  [87-90] 87 (01/31 1619) Resp:  [17-19] 17 (01/31 1619) BP: (152)/(71) 152/71 mmHg (01/31 1619) SpO2:  [95 %] 95 % (01/31 1619) Weight:  [73.573 kg (162 lb 3.2 oz)] 73.573 kg (162 lb 3.2 oz) (01/31 1619)   Physical Exam  Constitutional: Appears well-developed and well-nourished.  Psych: Affect appropriate to situation Eyes: No scleral injection HENT: No OP obstrucion Head: Normocephalic.  Cardiovascular: Normal rate and regular rhythm.  Respiratory: Effort normal and breath sounds normal to anterior ascultation GI: Soft.  No distension. There is no tenderness.  Skin: WDI  Neuro: Mental Status: Patient is awake, alert, oriented to person, place, month, year, and situation. Patient is able to give a clear and coherent history. No signs of aphasia or neglect Cranial Nerves: II: Visual Fields are full. Pupils are equal, round, and reactive to light.   III,IV, VI: EOMI without ptosis or diploplia.  V: Facial sensation is symmetric to temperature VII: Facial movement is symmetric.  VIII: hearing is intact to voice X: Uvula elevates symmetrically XI: Shoulder shrug is symmetric. XII: tongue is midline without atrophy or fasciculations.  Motor: Tone is normal. Bulk is normal. 5/5 strength was present in all four extremities.  Sensory: Sensation is symmetric to  light touch in the arms and legs. Cerebellar: FNF  intact bilaterally   I have reviewed labs in epic and the results pertinent to this consultation are: Creatinine 1.3  I have reviewed the images obtained: CT head-no acute findings  Impression: 82 year old female with transient left-sided numbness of the arm and left face. In the setting of chest pain, I do not think that this is definitely neurological in origin but it is difficult to rule out. I would favor continuing evaluations for cardiac or vascular causes of  the pain. If no other etiology is found, then TIA would be a consideration for the audiology of her numbness but I still do not think that this would explain her pain.  Recommendations: 1) MRI brain 2) workup for other causes of chest pain per emergency room, if no other likely cause of her numbness is found such as myocardial infarction or aortic dissection then I would favor working this up as TIA.   Roland Rack, MD [facility] [phone]  If 7pm- 7am, please page neurology on call as listed in [place].

## 2015-11-14 NOTE — ED Notes (Signed)
Patient denies any chest pain at this time.  Patient requesting something to eat explained that she is NPO

## 2015-11-14 NOTE — H&P (Signed)
[facility] History and Physical  Melissa [name] [id number] DOB: 17-Aug-1928 DOA: 11/14/2015  Referring physician: ER physician. PCP: [name], MD  Specialists: [facility].  Chief Complaint: Chest pain and left upper extremity numbness.  HPI: Melissa [name] is a 82 y.o. female with history of cardiomyopathy with last EF measured was 30-35% in September 2016, paroxysmal atrial fibrillation, chronic kidney disease, diabetes mellitus presents to the ER because of ongoing chest pain and left-sided upper extremity numbness. Patient has been having chest pain off and on for last 2 days. Lasts for a few seconds and it is under the left breast. Denies any rash around the area. Chest pain is sharp shooting in type and has no exertional symptoms. This afternoon patient also started developing left upper extremity numbness. CT angiogram of the chest today shows pulmonary nodules. CT head was negative. On-call neurologist has been consulted. Patient is presently chest pain-free and her left upper extremity numbness has resolved.   Review of Systems: As presented in the history of presenting illness, rest negative.  Past Medical History  Diagnosis Date  . Asthma   . Sleep apnea   . Angina   . Hypertension   . Blood transfusion   . GERD (gastroesophageal reflux disease)   . Headache(784.0)   . Arthritis   . Anxiety   . Pneumonia   . Diabetes mellitus   . Anemia   . Hiatal hernia   . Acute kidney injury ([place]) 09/26/2011  . High cholesterol   . Unspecified hypothyroidism 06/15/2013  . Cardiomyopathy ([place]) 09/02/2013    EF 45%, 12/12  . Paresthesia 08/22/2015  . Diabetic peripheral neuropathy associated with type 2 diabetes mellitus ([place]) 08/22/2015  . Diabetic peripheral neuropathy associated with type 2 diabetes mellitus ([name]) 08/22/2015  . Tremor, essential 08/22/2015   Past Surgical History  Procedure Laterality Date  . Abdominal hysterectomy    . Appendectomy    . Eye  surgery    . Tonsillectomy    . Breast surgery     Social History:  reports that she quit smoking about 54 years ago. She has never used smokeless tobacco. She reports that she does not drink alcohol or use illicit drugs. Where does patient live home. Can patient participate in ADLs? Yes.  No Known Allergies  Family History:  Family History  Problem Relation Age of Onset  . Stroke Mother   . Heart attack Mother   . Heart disease Father   . Cancer Father       Prior to Admission medications   Medication Sig Start Date End Date Taking? Authorizing Provider  acetaminophen (TYLENOL) 500 MG tablet Take 1,000 mg by mouth every 6 (six) hours as needed for mild pain. Reported on 11/02/2015   Yes Historical Provider, MD  albuterol (PROVENTIL) (2.5 MG/3ML) 0.083% nebulizer solution Take 2.5 mg by nebulization every 6 (six) hours as needed for wheezing or shortness of breath.  10/02/15  Yes Historical Provider, MD  apixaban (ELIQUIS) 2.5 MG TABS tablet Take 1 tablet (2.5 mg total) by mouth 2 (two) times daily. 04/25/15  Yes [name], MD  atorvastatin (LIPITOR) 40 MG tablet Take 1 tablet (40 mg total) by mouth daily at 6 PM. 07/13/15  Yes Geradine Girt, DO  Cholecalciferol (VITAMIN D3) 1000 UNITS CAPS Take 1 capsule by mouth daily.   Yes Historical Provider, MD  ciprofloxacin (CIPRO) 250 MG tablet Take 250 mg by mouth daily. 11/05/15  Yes Historical Provider, MD  esomeprazole (NEXIUM) 40 MG capsule Take 40 mg by mouth 2 (two) times daily.     Yes Historical Provider, MD  Fe Fum-FePoly-Vit C-Vit B3 (INTEGRA) 62.5-62.5-40-3 MG CAPS Take 1 capsule by mouth daily. 04/16/15  Yes Historical Provider, MD  FLUoxetine (PROZAC) 20 MG capsule Take 20 mg by mouth daily.  12/24/12  Yes Historical Provider, MD  Insulin Aspart (NOVOLOG [place]) Inject 4 Units into the skin 3 (three) times daily.    Yes Historical Provider, MD  Insulin Detemir (LEVEMIR FLEXTOUCH) 100 UNIT/ML Pen Inject 34 units at bedtime Patient  taking differently: Inject 26 Units into the skin daily at 10 pm. Inject 34 units at bedtime 07/19/15  Yes [name], MD  ipratropium (ATROVENT) 0.06 % nasal spray Place 2 sprays into both nostrils 4 (four) times daily. Patient taking differently: Place 2 sprays into both nostrils 4 (four) times daily as needed for rhinitis.  08/27/15  Yes Billy Fischer, MD  levalbuterol Penne Lash) 0.63 MG/3ML nebulizer solution Take 0.63 mg by nebulization every 6 (six) hours as needed for wheezing or shortness of breath.  09/28/14  Yes Historical Provider, MD  levothyroxine (SYNTHROID, LEVOTHROID) 50 MCG tablet TAKE 1 TABLET BY MOUTH EVERY DAY 06/23/13  Yes [name], MD  Liraglutide (VICTOZA) 18 MG/3ML SOPN Inject 0.2 mLs (1.2 mg total) into the skin daily. Patient taking differently: Inject 1.2 mg into the skin at bedtime.  09/27/15  Yes [name], MD  losartan (COZAAR) 25 MG tablet Take 1 tablet (25 mg total) by mouth daily. 08/04/15  Yes [name], MD  metoprolol succinate (TOPROL-XL) 50 MG 24 hr tablet Take 1 tablet (50 mg total) by mouth daily. 08/23/15  Yes [name], MD  pramipexole (MIRAPEX) 0.25 MG tablet TAKE 1 TABLET (0.25 MG TOTAL) BY MOUTH AT BEDTIME. 10/18/15  Yes Kathrynn Ducking, MD  pregabalin (LYRICA) 50 MG capsule Take up to 3 capsules daily as needed Patient taking differently: Take 50 mg by mouth 2 (two) times daily.  08/04/15  Yes [name], MD  traZODone (DESYREL) 50 MG tablet Take 50 mg by mouth at bedtime.  08/10/15  Yes Historical Provider, MD  ondansetron (ZOFRAN) 4 MG tablet Take 1 tablet (4 mg total) by mouth every 8 (eight) hours as needed for nausea or vomiting. Patient not taking: Reported on 11/14/2015 09/26/15   Leandrew Koyanagi, MD    Physical Exam: Filed Vitals:   11/14/15 1945 11/14/15 2000 11/14/15 2040 11/14/15 2105  BP: 162/74 158/94 160/85   Pulse: 85 96 92   Temp:    97.4 F (36.3 C)  TempSrc:    Oral  Resp: 15 19 16    Height:   5\' 5"  (1.651 m)    Weight:   71.169 kg (156 lb 14.4 oz)   SpO2: 94% 91% 94%      General:  Moderately built and nourished.  Eyes: Anicteric. No pallor.  ENT: No discharge from the ears eyes nose or mouth.  Neck: No mass felt. No neck rigidity.  Cardiovascular: S1 and S2 heard.  Respiratory: No rhonchi or crepitations.  Abdomen: Soft nontender bowel sounds present.  Skin: No rash.  Musculoskeletal: No edema.  Psychiatric: Appears normal.  Neurologic: Alert awake oriented to time place and person. Moves all extremities.  Labs on Admission:  Basic Metabolic Panel:  Recent Labs Lab 11/14/15 1558 11/14/15 1603  NA 136 136  K 4.3 4.2  CL 100* 98*  CO2 27  --  GLUCOSE 114* 110*  BUN 16 19  CREATININE 1.32* 1.30*  CALCIUM 8.9  --    Liver Function Tests:  Recent Labs Lab 11/14/15 1558  AST 16  ALT 13*  ALKPHOS 51  BILITOT 0.2*  PROT 6.7  ALBUMIN 3.6   No results for input(s): LIPASE, AMYLASE in the last 168 hours. No results for input(s): AMMONIA in the last 168 hours. CBC:  Recent Labs Lab 11/14/15 1558 11/14/15 1603  WBC 8.7  --   NEUTROABS 6.0  --   HGB 11.2* 12.2  HCT 35.2* 36.0  MCV 87.1  --   PLT 229  --    Cardiac Enzymes: No results for input(s): CKTOTAL, CKMB, CKMBINDEX, TROPONINI in the last 168 hours.  BNP (last 3 results) No results for input(s): BNP in the last 8760 hours.  ProBNP (last 3 results) No results for input(s): PROBNP in the last 8760 hours.  CBG:  Recent Labs Lab 11/14/15 2126  GLUCAP 88    Radiological Exams on Admission: Ct Head Wo Contrast  11/14/2015  CLINICAL DATA:  Code stroke.  Right-sided facial numbness EXAM: CT HEAD WITHOUT CONTRAST TECHNIQUE: Contiguous axial images were obtained from the base of the skull through the vertex without intravenous contrast. COMPARISON:  12/24/2014 FINDINGS: Prominence of the sulci are identified. There is mild diffuse low-attenuation within the subcortical and periventricular white  matter compatible with chronic microvascular disease. No evidence for acute brain infarct, intracranial hemorrhage or mass. No abnormal extra-axial fluid collections identified. There is a fluid level within the left maxillary sinus. The mastoid air cells appear clear. IMPRESSION: 1. No acute intracranial abnormalities. 2. Left maxillary sinus fluid level. These results were called by telephone at the time of interpretation on 11/14/2015 at 4:14 pm to Dr. Leonel Ramsay, who verbally acknowledged these results. Electronically Signed   By: Kerby Moors M.D.   On: 11/14/2015 16:15   Ct Angio Chest Aorta W/cm &/or Wo/cm  11/14/2015  CLINICAL DATA:  Pt having left sided chest pain radiating to back started last night with numbness in left arm. Pt has had a cough x 2 months. Pt denies sob. EXAM: CT ANGIOGRAPHY CHEST WITH CONTRAST TECHNIQUE: Multidetector CT imaging of the chest was performed using the standard protocol during bolus administration of intravenous contrast. Multiplanar CT image reconstructions and MIPs were obtained to evaluate the vascular anatomy. CONTRAST:  19mL OMNIPAQUE IOHEXOL 350 MG/ML SOLN COMPARISON:  Chest CT, 07/11/2015 FINDINGS: Angiographic study: On the unenhanced images there is mild atherosclerotic calcifications along the aortic arch and descending thoracic aorta. The aorta is normal in caliber. There are mild coronary artery calcifications. The postcontrast images, there is no evidence of an aortic dissection. Aorta is widely patent. There is a standard branching pattern of for the aortic arch vessels. Mild partly calcified plaque is noted at the origin of the left subclavian artery without stenosis. More significant atherosclerotic plaque is noted in the upper abdominal aorta and at the origins of the celiac axis and superior mesenteric artery. No evidence of a significant stenosis. Neck pain and axilla: No mass or adenopathy. Thyroid is unremarkable. Mediastinum and hila: A heart normal  in size and configuration. Great vessels are normal in caliber. New no radiates down or hilar masses or pathologically enlarged lymph nodes. Lungs and pleura: There are several small pulmonary nodules. There is a 4 mm nodule in the right upper lobe near the apex, image 20, series 6. There is a 3 mm nodule left upper lobe, image 41.  There is a 4 mm nodule in the left lower lobe, image 69. A focal area of opacity lies in the inferior right upper lobe adjacent to the minor fissure. A somewhat ill-defined solid component of this, discoid in shape based on the sagittal coronal reconstructed images, measures 14 mm in greatest dimension. This is stable from the prior study. There are few other very small nodules. There are areas of mild peripheral interstitial thickening. There is no evidence of pneumonia or edema. No pleural effusion or pneumothorax. Limited upper abdomen:  Unremarkable. Musculoskeletal:  Unremarkable. Review of the MIP images confirms the above findings. IMPRESSION: 1. No evidence of aortic dissection or aortic aneurysm. 2. No acute findings. 3. Scattered small lung nodules similar to the prior study. Persistent opacity within the inferior right upper lobe adjacent to the minor fissure. No evidence of pneumonia or pulmonary edema. Electronically Signed   By: Lajean Manes M.D.   On: 11/14/2015 17:28    EKG: Independently reviewed. Normal sinus rhythm.  Assessment/Plan Principal Problem:   Chest pain Active Problems:   HTN (hypertension)   Paroxysmal atrial fibrillation (HCC)   Diabetic peripheral neuropathy associated with type 2 diabetes mellitus (HCC)   Left upper extremity numbness   1. Chest pain appears atypical - patient has had unremarkable stress Myoview in 2013. Given the history of diabetes mellitus hypertension we will cycle cardiac markers to rule out ACS. Cardiology consult requested. 2. Left upper extremity numbness - concerning for TIA. Appreciate neurology consult. MRI  brain has been ordered. Patient is on Eliquis. 3. Paroxysmal atrial fibrillation - presently in sinus rhythm and rate control. Patient is on Eliquis. And Toprol. 4. Cardiomyopathy last EF measured was 30-35% in September 2016 - patient is on ARB which will be continued. Not on diuretics. 5. Chronic kidney disease stage III - creatinine appears to be at baseline. Follow metabolic panel. 6. Diabetes mellitus type 2 - since patient will be kept nothing by mouth in a.m. I have decrease patient's Levemir dose to 15 units. Follow C with sliding scale coverage. 7. Chronic anemia  - follow CBC.    DVT ProphylaxisApixaban.  Code Status: DO NOT RESUSCITATE. Family Communication: Patient's daughter and son at the bedside.  Disposition Plan: Admit for observation.    Apollos Tenbrink N. [facility] Pager [phone].  If 7PM-7AM, please contact night-coverage [email or web address] Password [email or web address] 11/14/2015, 10:01 PM

## 2015-11-14 NOTE — ED Provider Notes (Signed)
Level V caveat, code stroke. History per EMS. Patient developed sudden onset numbness left face and left arm 3 PM today. Patient also reports intermittent stabbing left chest pain radiating to left back onset last night. Coming every 45 minutes. She is pain-free at present Presently she complains only of numbness at her left cheek. Symptoms improving with time. On exam patient is alert Glasgow coma score 15 resolved extremities well. No facial asymmetry. We will obtain CT angiogram to check for aortic dissection in light of chest pain with neurologic complaint  Orlie Dakin, MD 11/15/15 [id number]

## 2015-11-14 NOTE — ED Provider Notes (Signed)
CSN: [id number]     Arrival date & time 11/14/15  1555 History   First MD Initiated Contact with Patient 11/14/15 1600     Chief Complaint  Patient presents with  . Code Stroke    @EDPCLEARED @ (Consider location/radiation/quality/duration/timing/severity/associated sxs/prior Treatment) Patient is a 82 y.o. female presenting with chest pain. The history is provided by the patient.  Chest Pain Pain location:  L chest Pain quality: stabbing and tearing   Pain radiates to:  Upper back Pain radiates to the back: yes   Pain severity:  Mild Onset quality:  Gradual Duration: started last nihgt. Timing:  Intermittent Progression:  Waxing and waning Chronicity:  New Context: movement and at rest   Relieved by:  None tried Worsened by:  Exertion Ineffective treatments:  None tried Associated symptoms: numbness   Associated symptoms: no abdominal pain, no altered mental status, no cough, no fever, no shortness of breath and no syncope     Past Medical History  Diagnosis Date  . Asthma   . Sleep apnea   . Angina   . Hypertension   . Blood transfusion   . GERD (gastroesophageal reflux disease)   . Headache(784.0)   . Arthritis   . Anxiety   . Pneumonia   . Diabetes mellitus   . Anemia   . Hiatal hernia   . Acute kidney injury ([place]) 09/26/2011  . High cholesterol   . Unspecified hypothyroidism 06/15/2013  . Cardiomyopathy ([place]) 09/02/2013    EF 45%, 12/12  . Paresthesia 08/22/2015  . Diabetic peripheral neuropathy associated with type 2 diabetes mellitus ([place]) 08/22/2015  . Diabetic peripheral neuropathy associated with type 2 diabetes mellitus ([place]) 08/22/2015  . Tremor, essential 08/22/2015   Past Surgical History  Procedure Laterality Date  . Abdominal hysterectomy    . Appendectomy    . Eye surgery    . Tonsillectomy    . Breast surgery     Family History  Problem Relation Age of Onset  . Stroke Mother   . Heart attack Mother   . Heart disease Father   . Cancer  Father    Social History  Substance Use Topics  . Smoking status: Former Smoker -- 1.00 packs/day for 20 years    Quit date: 09/21/1961  . Smokeless tobacco: Never Used  . Alcohol Use: No   OB History    No data available     Review of Systems  Constitutional: Negative for fever.  HENT: Negative.   Eyes: Negative for visual disturbance.  Respiratory: Negative for cough and shortness of breath.   Cardiovascular: Positive for chest pain. Negative for syncope.  Gastrointestinal: Negative for abdominal pain.  Genitourinary: Negative.   Musculoskeletal: Negative.   Skin: Negative.   Neurological: Positive for numbness.      Allergies  Review of patient's allergies indicates no known allergies.  Home Medications   Prior to Admission medications   Medication Sig Start Date End Date Taking? Authorizing Provider  acetaminophen (TYLENOL) 500 MG tablet Take 1,000 mg by mouth every 6 (six) hours as needed for mild pain. Reported on 11/02/2015   Yes Historical Provider, MD  albuterol (PROVENTIL) (2.5 MG/3ML) 0.083% nebulizer solution Take 2.5 mg by nebulization every 6 (six) hours as needed for wheezing or shortness of breath.  10/02/15  Yes Historical Provider, MD  apixaban (ELIQUIS) 2.5 MG TABS tablet Take 1 tablet (2.5 mg total) by mouth 2 (two) times daily. 04/25/15  Yes [name], MD  atorvastatin (LIPITOR) 40  MG tablet Take 1 tablet (40 mg total) by mouth daily at 6 PM. 07/13/15  Yes Geradine Girt, DO  Cholecalciferol (VITAMIN D3) 1000 UNITS CAPS Take 1 capsule by mouth daily.   Yes Historical Provider, MD  ciprofloxacin (CIPRO) 250 MG tablet Take 250 mg by mouth daily. 11/05/15  Yes Historical Provider, MD  esomeprazole (NEXIUM) 40 MG capsule Take 40 mg by mouth 2 (two) times daily.     Yes Historical Provider, MD  Fe Fum-FePoly-Vit C-Vit B3 (INTEGRA) 62.5-62.5-40-3 MG CAPS Take 1 capsule by mouth daily. 04/16/15  Yes Historical Provider, MD  FLUoxetine (PROZAC) 20 MG capsule Take  20 mg by mouth daily.  12/24/12  Yes Historical Provider, MD  Insulin Aspart (NOVOLOG Gary) Inject 4 Units into the skin 3 (three) times daily.    Yes Historical Provider, MD  Insulin Detemir (LEVEMIR FLEXTOUCH) 100 UNIT/ML Pen Inject 34 units at bedtime Patient taking differently: Inject 26 Units into the skin daily at 10 pm. Inject 34 units at bedtime 07/19/15  Yes [name], MD  ipratropium (ATROVENT) 0.06 % nasal spray Place 2 sprays into both nostrils 4 (four) times daily. Patient taking differently: Place 2 sprays into both nostrils 4 (four) times daily as needed for rhinitis.  08/27/15  Yes Billy Fischer, MD  levalbuterol Penne Lash) 0.63 MG/3ML nebulizer solution Take 0.63 mg by nebulization every 6 (six) hours as needed for wheezing or shortness of breath.  09/28/14  Yes Historical Provider, MD  levothyroxine (SYNTHROID, LEVOTHROID) 50 MCG tablet TAKE 1 TABLET BY MOUTH EVERY DAY 06/23/13  Yes [name], MD  Liraglutide (VICTOZA) 18 MG/3ML SOPN Inject 0.2 mLs (1.2 mg total) into the skin daily. Patient taking differently: Inject 1.2 mg into the skin at bedtime.  09/27/15  Yes [name], MD  losartan (COZAAR) 25 MG tablet Take 1 tablet (25 mg total) by mouth daily. 08/04/15  Yes [name], MD  metoprolol succinate (TOPROL-XL) 50 MG 24 hr tablet Take 1 tablet (50 mg total) by mouth daily. 08/23/15  Yes [name], MD  pramipexole (MIRAPEX) 0.25 MG tablet TAKE 1 TABLET (0.25 MG TOTAL) BY MOUTH AT BEDTIME. 10/18/15  Yes Kathrynn Ducking, MD  pregabalin (LYRICA) 50 MG capsule Take up to 3 capsules daily as needed Patient taking differently: Take 50 mg by mouth 2 (two) times daily.  08/04/15  Yes [name], MD  traZODone (DESYREL) 50 MG tablet Take 50 mg by mouth at bedtime.  08/10/15  Yes Historical Provider, MD  ondansetron (ZOFRAN) 4 MG tablet Take 1 tablet (4 mg total) by mouth every 8 (eight) hours as needed for nausea or vomiting. Patient not taking: Reported on 11/14/2015 09/26/15    Leandrew Koyanagi, MD   BP 174/94 mmHg  Pulse 87  Temp(Src) 98 F (36.7 C) (Oral)  Resp 17  Ht 5\' 5"  (1.651 m)  Wt 69.763 kg  BMI 25.59 kg/m2  SpO2 94% Physical Exam  Constitutional: She appears well-developed and well-nourished. No distress.  HENT:  Head: Normocephalic and atraumatic.  Mouth/Throat: No oropharyngeal exudate.  Eyes: Pupils are equal, round, and reactive to light. No scleral icterus.  Neck: Normal range of motion. Neck supple.  Cardiovascular: Normal rate, regular rhythm, normal heart sounds and intact distal pulses.   Pulmonary/Chest: Effort normal and breath sounds normal. No respiratory distress. She has no wheezes. She has no rales.  Abdominal: Soft. She exhibits no distension. There is no tenderness. There is no rebound and no guarding.  Musculoskeletal: Normal range of motion. She exhibits no edema or tenderness.  Neurological: She is alert. She has normal strength. She is not disoriented. No cranial nerve deficit or sensory deficit. She exhibits normal muscle tone. She displays a negative Romberg sign. Coordination and gait normal. GCS eye subscore is 4. GCS verbal subscore is 5. GCS motor subscore is 6.  Skin: Skin is warm and dry. No rash noted. She is not diaphoretic. No erythema. No pallor.  Psychiatric: She has a normal mood and affect.  Nursing note and vitals reviewed.   ED Course  Procedures (including critical care time) Labs Review Labs Reviewed  CBC - Abnormal; Notable for the following:    Hemoglobin 11.2 (*)    HCT 35.2 (*)    All other components within normal limits  COMPREHENSIVE METABOLIC PANEL - Abnormal; Notable for the following:    Chloride 100 (*)    Glucose, Bld 114 (*)    Creatinine, Ser 1.32 (*)    ALT 13 (*)    Total Bilirubin 0.2 (*)    GFR calc non Af Amer 36 (*)    GFR calc Af Amer 41 (*)    All other components within normal limits  URINALYSIS, ROUTINE W REFLEX MICROSCOPIC (NOT AT [facility]) - Abnormal; Notable for the  following:    APPearance HAZY (*)    Hgb urine dipstick TRACE (*)    Leukocytes, UA MODERATE (*)    All other components within normal limits  URINE MICROSCOPIC-ADD ON - Abnormal; Notable for the following:    Squamous Epithelial / LPF 0-5 (*)    Bacteria, UA MANY (*)    All other components within normal limits  COMPREHENSIVE METABOLIC PANEL - Abnormal; Notable for the following:    Glucose, Bld 112 (*)    Creatinine, Ser 1.24 (*)    Total Protein 6.3 (*)    Albumin 3.3 (*)    AST 13 (*)    ALT 11 (*)    GFR calc non Af Amer 38 (*)    GFR calc Af Amer 45 (*)    All other components within normal limits  CBC - Abnormal; Notable for the following:    Hemoglobin 11.0 (*)    HCT 35.0 (*)    All other components within normal limits  LIPID PANEL - Abnormal; Notable for the following:    Triglycerides 210 (*)    HDL 34 (*)    VLDL 42 (*)    All other components within normal limits  GLUCOSE, CAPILLARY - Abnormal; Notable for the following:    Glucose-Capillary 108 (*)    All other components within normal limits  GLUCOSE, CAPILLARY - Abnormal; Notable for the following:    Glucose-Capillary 137 (*)    All other components within normal limits  I-STAT CHEM 8, ED - Abnormal; Notable for the following:    Chloride 98 (*)    Creatinine, Ser 1.30 (*)    Glucose, Bld 110 (*)    Calcium, Ion 1.04 (*)    All other components within normal limits  ETHANOL  PROTIME-INR  APTT  DIFFERENTIAL  URINE RAPID DRUG SCREEN, HOSP PERFORMED  GLUCOSE, CAPILLARY  TROPONIN I  TROPONIN I  TROPONIN I  HEMOGLOBIN A1C  I-STAT TROPOININ, ED    Imaging Review Ct Head Wo Contrast  11/14/2015  CLINICAL DATA:  Code stroke.  Right-sided facial numbness EXAM: CT HEAD WITHOUT CONTRAST TECHNIQUE: Contiguous axial images were obtained from the base of the skull through the  vertex without intravenous contrast. COMPARISON:  12/24/2014 FINDINGS: Prominence of the sulci are identified. There is mild diffuse  low-attenuation within the subcortical and periventricular white matter compatible with chronic microvascular disease. No evidence for acute brain infarct, intracranial hemorrhage or mass. No abnormal extra-axial fluid collections identified. There is a fluid level within the left maxillary sinus. The mastoid air cells appear clear. IMPRESSION: 1. No acute intracranial abnormalities. 2. Left maxillary sinus fluid level. These results were called by telephone at the time of interpretation on 11/14/2015 at 4:14 pm to Dr. Leonel Ramsay, who verbally acknowledged these results. Electronically Signed   By: Kerby Moors M.D.   On: 11/14/2015 16:15   Mr Jodene Nam Head Wo Contrast   Ct Angio Chest Aorta W/cm &/or Wo/cm  11/14/2015  CLINICAL DATA:  Pt having left sided chest pain radiating to back started last night with numbness in left arm. Pt has had a cough x 2 months. Pt denies sob. EXAM: CT ANGIOGRAPHY CHEST WITH CONTRAST TECHNIQUE: Multidetector CT imaging of the chest was performed using the standard protocol during bolus administration of intravenous contrast. Multiplanar CT image reconstructions and MIPs were obtained to evaluate the vascular anatomy. CONTRAST:  61mL OMNIPAQUE IOHEXOL 350 MG/ML SOLN COMPARISON:  Chest CT, 07/11/2015 FINDINGS: Angiographic study: On the unenhanced images there is mild atherosclerotic calcifications along the aortic arch and descending thoracic aorta. The aorta is normal in caliber. There are mild coronary artery calcifications. The postcontrast images, there is no evidence of an aortic dissection. Aorta is widely patent. There is a standard branching pattern of for the aortic arch vessels. Mild partly calcified plaque is noted at the origin of the left subclavian artery without stenosis. More significant atherosclerotic plaque is noted in the upper abdominal aorta and at the origins of the celiac axis and superior mesenteric artery. No evidence of a significant stenosis. Neck pain  and axilla: No mass or adenopathy. Thyroid is unremarkable. Mediastinum and hila: A heart normal in size and configuration. Great vessels are normal in caliber. New no radiates down or hilar masses or pathologically enlarged lymph nodes. Lungs and pleura: There are several small pulmonary nodules. There is a 4 mm nodule in the right upper lobe near the apex, image 20, series 6. There is a 3 mm nodule left upper lobe, image 41. There is a 4 mm nodule in the left lower lobe, image 69. A focal area of opacity lies in the inferior right upper lobe adjacent to the minor fissure. A somewhat ill-defined solid component of this, discoid in shape based on the sagittal coronal reconstructed images, measures 14 mm in greatest dimension. This is stable from the prior study. There are few other very small nodules. There are areas of mild peripheral interstitial thickening. There is no evidence of pneumonia or edema. No pleural effusion or pneumothorax. Limited upper abdomen:  Unremarkable. Musculoskeletal:  Unremarkable. Review of the MIP images confirms the above findings. IMPRESSION: 1. No evidence of aortic dissection or aortic aneurysm. 2. No acute findings. 3. Scattered small lung nodules similar to the prior study. Persistent opacity within the inferior right upper lobe adjacent to the minor fissure. No evidence of pneumonia or pulmonary edema. Electronically Signed   By: Lajean Manes M.D.   On: 11/14/2015 17:28   I have personally reviewed and evaluated these images and lab results as part of my medical decision-making.   EKG Interpretation   Date/Time:  Tuesday November 14 2015 16:15:02 EST Ventricular Rate:  86 PR Interval:  190 QRS Duration: 89 QT Interval:  402 QTC Calculation: 481 R Axis:   44 Text Interpretation:  Sinus rhythm Baseline wander in lead(s) II III aVF  Since last tracing rate slower Confirmed by Winfred Leeds  MD, SAM [phone]) on  11/14/2015 4:19:43 PM      MDM   Final diagnoses:   Chest pain  Transient cerebral ischemia, unspecified transient cerebral ischemia type    Patient is a 82 year old female who presents as a code stroke for left sided face and left arm numbness that began at 1500. Initial head CT without intracranial hemorrhage. Upon further questioning she also complains of sharp left-sided chest pain radiating to her back and left shoulder. EKG without acute ischemic changes initial troponin negative. Vital signs with hypertension but otherwise hemodynamically stable. CTA obtained to rule out dissection which was negative. Labs otherwise reassuring. Patient to be admitted to medicine for further management and evaluation    Heriberto Antigua, MD 11/15/15 [place], MD 11/17/15 1420

## 2015-11-14 NOTE — ED Notes (Signed)
Pt arrives via [facility] for c/o left sided face and arm numbness that began at 1500. EMS reports patient also c/o left sided sharp chest pain. Upon arrival, patient reported only facial numbness remained.pt alert, airway intact.

## 2015-11-14 NOTE — ED Notes (Signed)
Attempted report, Network engineer on 3W advised patient not assigned to a nurse, attempted to give report to charge nurse but secretary advised charge RN also unable to take report at this time.

## 2015-11-15 ENCOUNTER — Observation Stay (HOSPITAL_BASED_OUTPATIENT_CLINIC_OR_DEPARTMENT_OTHER): Payer: Medicare Other

## 2015-11-15 ENCOUNTER — Encounter (HOSPITAL_COMMUNITY): Payer: Medicare Other

## 2015-11-15 ENCOUNTER — Ambulatory Visit (HOSPITAL_BASED_OUTPATIENT_CLINIC_OR_DEPARTMENT_OTHER): Payer: Medicare Other

## 2015-11-15 ENCOUNTER — Observation Stay (HOSPITAL_COMMUNITY): Payer: Medicare Other

## 2015-11-15 DIAGNOSIS — R079 Chest pain, unspecified: Secondary | ICD-10-CM | POA: Insufficient documentation

## 2015-11-15 DIAGNOSIS — R0789 Other chest pain: Secondary | ICD-10-CM | POA: Diagnosis not present

## 2015-11-15 DIAGNOSIS — J019 Acute sinusitis, unspecified: Secondary | ICD-10-CM | POA: Diagnosis present

## 2015-11-15 DIAGNOSIS — G459 Transient cerebral ischemic attack, unspecified: Secondary | ICD-10-CM | POA: Diagnosis not present

## 2015-11-15 DIAGNOSIS — R202 Paresthesia of skin: Secondary | ICD-10-CM | POA: Diagnosis not present

## 2015-11-15 DIAGNOSIS — J0100 Acute maxillary sinusitis, unspecified: Secondary | ICD-10-CM

## 2015-11-15 LAB — NM MYOCAR MULTI W/SPECT W/WALL MOTION / EF
Estimated workload: 1 METS
Exercise duration (min): 5 min
LV dias vol: 105 mL
LV sys vol: 78 mL
MPHR: 135 {beats}/min
Peak HR: 110 {beats}/min
Percent HR: 81 %
RATE: 0
Rest HR: 82 {beats}/min
SDS: 1
SRS: 9
SSS: 10
TID: 1.31

## 2015-11-15 LAB — COMPREHENSIVE METABOLIC PANEL
ALT: 11 U/L — AB (ref 14–54)
ANION GAP: 7 (ref 5–15)
AST: 13 U/L — ABNORMAL LOW (ref 15–41)
Albumin: 3.3 g/dL — ABNORMAL LOW (ref 3.5–5.0)
Alkaline Phosphatase: 46 U/L (ref 38–126)
BUN: 13 mg/dL (ref 6–20)
CHLORIDE: 102 mmol/L (ref 101–111)
CO2: 29 mmol/L (ref 22–32)
CREATININE: 1.24 mg/dL — AB (ref 0.44–1.00)
Calcium: 8.9 mg/dL (ref 8.9–10.3)
GFR, EST AFRICAN AMERICAN: 45 mL/min — AB (ref >60)
GFR, EST NON AFRICAN AMERICAN: 38 mL/min — AB (ref >60)
GLUCOSE: 112 mg/dL — AB (ref 65–99)
POTASSIUM: 4.1 mmol/L (ref 3.5–5.1)
Sodium: 138 mmol/L (ref 135–145)
Total Bilirubin: 0.3 mg/dL (ref 0.3–1.2)
Total Protein: 6.3 g/dL — ABNORMAL LOW (ref 6.5–8.1)

## 2015-11-15 LAB — TROPONIN I

## 2015-11-15 LAB — LIPID PANEL
Cholesterol: 138 mg/dL (ref 0–200)
HDL: 34 mg/dL — AB (ref >40)
LDL CALC: 62 mg/dL (ref 0–99)
Total CHOL/HDL Ratio: 4.1 RATIO
Triglycerides: 210 mg/dL — ABNORMAL HIGH (ref <150)
VLDL: 42 mg/dL — ABNORMAL HIGH (ref 0–40)

## 2015-11-15 LAB — GLUCOSE, CAPILLARY
GLUCOSE-CAPILLARY: 108 mg/dL — AB (ref 65–99)
GLUCOSE-CAPILLARY: 140 mg/dL — AB (ref 65–99)
GLUCOSE-CAPILLARY: 126 mg/dL — AB (ref 65–99)
Glucose-Capillary: 137 mg/dL — ABNORMAL HIGH (ref 65–99)

## 2015-11-15 LAB — CBC
HCT: 35 % — ABNORMAL LOW (ref 36.0–46.0)
Hemoglobin: 11 g/dL — ABNORMAL LOW (ref 12.0–15.0)
MCH: 27.4 pg (ref 26.0–34.0)
MCHC: 31.4 g/dL (ref 30.0–36.0)
MCV: 87.3 fL (ref 78.0–100.0)
PLATELETS: 219 10*3/uL (ref 150–400)
RBC: 4.01 MIL/uL (ref 3.87–5.11)
RDW: 14.2 % (ref 11.5–15.5)
WBC: 6.9 10*3/uL (ref 4.0–10.5)

## 2015-11-15 MED ORDER — REGADENOSON 0.4 MG/5ML IV SOLN
0.4000 mg | Freq: Once | INTRAVENOUS | Status: AC
  Administered 2015-11-15: 0.4 mg via INTRAVENOUS
  Filled 2015-11-15: qty 5

## 2015-11-15 MED ORDER — REGADENOSON 0.4 MG/5ML IV SOLN
INTRAVENOUS | Status: AC
  Filled 2015-11-15: qty 5

## 2015-11-15 MED ORDER — TECHNETIUM TC 99M SESTAMIBI - CARDIOLITE
30.0000 | Freq: Once | INTRAVENOUS | Status: AC | PRN
  Administered 2015-11-15: 30 via INTRAVENOUS

## 2015-11-15 MED ORDER — TECHNETIUM TC 99M SESTAMIBI GENERIC - CARDIOLITE
10.0000 | Freq: Once | INTRAVENOUS | Status: AC | PRN
  Administered 2015-11-15: 10 via INTRAVENOUS

## 2015-11-15 MED ORDER — AMOXICILLIN-POT CLAVULANATE 875-125 MG PO TABS
1.0000 | ORAL_TABLET | Freq: Two times a day (BID) | ORAL | Status: DC
  Administered 2015-11-15 – 2015-11-16 (×2): 1 via ORAL
  Filled 2015-11-15 (×2): qty 1

## 2015-11-15 MED ORDER — ACETAMINOPHEN 325 MG PO TABS
650.0000 mg | ORAL_TABLET | Freq: Four times a day (QID) | ORAL | Status: DC | PRN
  Administered 2015-11-15 – 2015-11-16 (×3): 650 mg via ORAL
  Filled 2015-11-15 (×3): qty 2

## 2015-11-15 MED ORDER — FLUTICASONE PROPIONATE 50 MCG/ACT NA SUSP
2.0000 | Freq: Every day | NASAL | Status: DC
  Administered 2015-11-15 – 2015-11-16 (×2): 2 via NASAL
  Filled 2015-11-15: qty 16

## 2015-11-15 MED ORDER — GI COCKTAIL ~~LOC~~
30.0000 mL | Freq: Once | ORAL | Status: AC
  Administered 2015-11-15: 30 mL via ORAL
  Filled 2015-11-15: qty 30

## 2015-11-15 NOTE — Progress Notes (Signed)
RN spoke with Dr. Hal Hope pertaining to patient's MRI results.  Per Dr. Hal Hope ok to change neurological checks and vitals signs to every four hours starting now.

## 2015-11-15 NOTE — Care Management Obs Status (Signed)
[place] NOTIFICATION   Patient Details  Name: Melissa [name] MRN: [id number] Date of Birth: [birth year]   Medicare Observation Status Notification Given:  Yes    Ninfa Meeker, RN 11/15/2015, 3:47 PM

## 2015-11-15 NOTE — Care Management Obs Status (Signed)
[place] NOTIFICATION   Patient Details  Name: Melissa [name] MRN: [id number] Date of Birth: 01-17-1928   Medicare Observation Status Notification Given:   Yes    Ninfa Meeker, RN 11/15/2015, 3:47 PM

## 2015-11-15 NOTE — Plan of Care (Signed)
Problem: Physical Regulation: Goal: Will remain free from infection Outcome: Progressing No signs of symptoms of infection noted thus far this shift.

## 2015-11-15 NOTE — Progress Notes (Signed)
Pt with N/V during [place]. Threw up small amount of brown colored emesis. In no distress.

## 2015-11-15 NOTE — Care Management Obs Status (Signed)
[place] NOTIFICATION   Patient Details  Name: Melissa [name] MRN: [id number] Date of Birth: 1928/02/23   Medicare Observation Status Notification Given:   Yes    Ninfa Meeker, RN 11/15/2015, 3:46 PM

## 2015-11-15 NOTE — Progress Notes (Signed)
VASCULAR LAB PRELIMINARY  PRELIMINARY  PRELIMINARY  PRELIMINARY  Carotid Duplex has been completed.    Left side-  60-79% internal carotid artery stenosis. Ratio=3.84. Right side- 1-39% internal carotid artery stenosis. Ratio=0.91 Vertebral Artery are antegrade  Janifer Adie, RVT, RDMS 11/15/2015, 3:05 PM

## 2015-11-15 NOTE — Consult Note (Signed)
Reason for Consult:   Chest pain  Requesting Physician: [facility] Primary Cardiologist Dr [name]  HPI:   82 y/o female followed by Dr [name] with a history of cardiomyopathy- last EF 30-35%. She had a low risk Myoview in 2013. The pt says she had a "heart attack" 30 yrs ago, no history of cath or PTCA. Other problems include DM, HTN, HLD, PAF on Eliquis, and CRI-3. She is admitted now with complaints of localized Lt sided chest pain that woke her up. She denies any unusual dyspnea. She had some transient facial and lT arm numbness- MRI negative. She does admit to occasional chest "tightness" usually relieved with inhalers. Tropon has been negative, EKG WNL.   PMHx:  Past Medical History  Diagnosis Date  . Asthma   . Sleep apnea   . Angina   . Hypertension   . Blood transfusion   . GERD (gastroesophageal reflux disease)   . Headache(784.0)   . Arthritis   . Anxiety   . Pneumonia   . Diabetes mellitus   . Anemia   . Hiatal hernia   . Acute kidney injury ([place]) 09/26/2011  . High cholesterol   . Unspecified hypothyroidism 06/15/2013  . Cardiomyopathy ([place]) 09/02/2013    EF 45%, 12/12  . Paresthesia 08/22/2015  . Diabetic peripheral neuropathy associated with type 2 diabetes mellitus ([place]) 08/22/2015  . Diabetic peripheral neuropathy associated with type 2 diabetes mellitus ([place]) 08/22/2015  . Tremor, essential 08/22/2015    Past Surgical History  Procedure Laterality Date  . Abdominal hysterectomy    . Appendectomy    . Eye surgery    . Tonsillectomy    . Breast surgery      SOCHx:  reports that she quit smoking about 54 years ago. She has never used smokeless tobacco. She reports that she does not drink alcohol or use illicit drugs.  FAMHx: Family History  Problem Relation Age of Onset  . Stroke Mother   . Heart attack Mother   . Heart disease Father   . Cancer Father     ALLERGIES: No Known Allergies  ROS: Review of Systems: General: negative  for chills, fever, night sweats or weight changes.  Cardiovascular: negative for chest pain, dyspnea on exertion, edema, orthopnea, palpitations, paroxysmal nocturnal dyspnea or shortness of breath HEENT: negative for any visual disturbances, blindness, glaucoma Dermatological: negative for rash Respiratory: negative for cough, hemoptysis, or wheezing Urologic: negative for hematuria or dysuria Abdominal: negative for nausea, vomiting, diarrhea, bright red blood per rectum, melena, or hematemesis Neurologic: negative for visual changes, syncope, or dizziness Musculoskeletal: negative for back pain, joint pain, or swelling Psych: cooperative and appropriate All other systems reviewed and are otherwise negative except as noted above.   HOME MEDICATIONS: Prior to Admission medications   Medication Sig Start Date End Date Taking? Authorizing Provider  acetaminophen (TYLENOL) 500 MG tablet Take 1,000 mg by mouth every 6 (six) hours as needed for mild pain. Reported on 11/02/2015   Yes Historical Provider, MD  albuterol (PROVENTIL) (2.5 MG/3ML) 0.083% nebulizer solution Take 2.5 mg by nebulization every 6 (six) hours as needed for wheezing or shortness of breath.  10/02/15  Yes Historical Provider, MD  apixaban (ELIQUIS) 2.5 MG TABS tablet Take 1 tablet (2.5 mg total) by mouth 2 (two) times daily. 04/25/15  Yes [name], MD  atorvastatin (LIPITOR) 40 MG tablet Take 1 tablet (40 mg total) by mouth daily at 6 PM. 07/13/15  Yes Geradine Girt, DO  Cholecalciferol (VITAMIN D3) 1000 UNITS CAPS Take 1 capsule by mouth daily.   Yes Historical Provider, MD  ciprofloxacin (CIPRO) 250 MG tablet Take 250 mg by mouth daily. 11/05/15  Yes Historical Provider, MD  esomeprazole (NEXIUM) 40 MG capsule Take 40 mg by mouth 2 (two) times daily.     Yes Historical Provider, MD  Fe Fum-FePoly-Vit C-Vit B3 (INTEGRA) 62.5-62.5-40-3 MG CAPS Take 1 capsule by mouth daily. 04/16/15  Yes Historical Provider, MD  FLUoxetine  (PROZAC) 20 MG capsule Take 20 mg by mouth daily.  12/24/12  Yes Historical Provider, MD  Insulin Aspart (NOVOLOG [place]) Inject 4 Units into the skin 3 (three) times daily.    Yes Historical Provider, MD  Insulin Detemir (LEVEMIR FLEXTOUCH) 100 UNIT/ML Pen Inject 34 units at bedtime Patient taking differently: Inject 26 Units into the skin daily at 10 pm. Inject 34 units at bedtime 07/19/15  Yes [name], MD  ipratropium (ATROVENT) 0.06 % nasal spray Place 2 sprays into both nostrils 4 (four) times daily. Patient taking differently: Place 2 sprays into both nostrils 4 (four) times daily as needed for rhinitis.  08/27/15  Yes Billy Fischer, MD  levalbuterol Penne Lash) 0.63 MG/3ML nebulizer solution Take 0.63 mg by nebulization every 6 (six) hours as needed for wheezing or shortness of breath.  09/28/14  Yes Historical Provider, MD  levothyroxine (SYNTHROID, LEVOTHROID) 50 MCG tablet TAKE 1 TABLET BY MOUTH EVERY DAY 06/23/13  Yes [name], MD  Liraglutide (VICTOZA) 18 MG/3ML SOPN Inject 0.2 mLs (1.2 mg total) into the skin daily. Patient taking differently: Inject 1.2 mg into the skin at bedtime.  09/27/15  Yes [name], MD  losartan (COZAAR) 25 MG tablet Take 1 tablet (25 mg total) by mouth daily. 08/04/15  Yes [name], MD  metoprolol succinate (TOPROL-XL) 50 MG 24 hr tablet Take 1 tablet (50 mg total) by mouth daily. 08/23/15  Yes [name], MD  pramipexole (MIRAPEX) 0.25 MG tablet TAKE 1 TABLET (0.25 MG TOTAL) BY MOUTH AT BEDTIME. 10/18/15  Yes Kathrynn Ducking, MD  pregabalin (LYRICA) 50 MG capsule Take up to 3 capsules daily as needed Patient taking differently: Take 50 mg by mouth 2 (two) times daily.  08/04/15  Yes [name], MD  traZODone (DESYREL) 50 MG tablet Take 50 mg by mouth at bedtime.  08/10/15  Yes Historical Provider, MD  ondansetron (ZOFRAN) 4 MG tablet Take 1 tablet (4 mg total) by mouth every 8 (eight) hours as needed for nausea or vomiting. Patient not taking: Reported  on 11/14/2015 09/26/15   Leandrew Koyanagi, MD    HOSPITAL MEDICATIONS: I have reviewed the patient's current medications.  VITALS: Blood pressure 155/68, pulse 87, temperature 98 F (36.7 C), temperature source Oral, resp. rate 17, height 5\' 5"  (1.651 m), weight 153 lb 12.8 oz (69.763 kg), SpO2 94 %.  PHYSICAL EXAM: General appearance: alert, cooperative and no distress Neck: no carotid bruit and no JVD Lungs: clear to auscultation bilaterally Heart: regular rate and rhythm Abdomen: soft, non-tender; bowel sounds normal; no masses,  no organomegaly Extremities: extremities normal, atraumatic, no cyanosis or edema Pulses: 2+ and symmetric Skin: Skin color, texture, turgor normal. No rashes or lesions Neurologic: Grossly normal  LABS: Results for orders placed or performed during the hospital encounter of 11/14/15 (from the past 24 hour(s))  Ethanol     Status: None   Collection Time: 11/14/15  3:58 PM  Result Value  Ref Range   Alcohol, Ethyl (B) <5 <5 mg/dL  Protime-INR     Status: None   Collection Time: 11/14/15  3:58 PM  Result Value Ref Range   Prothrombin Time 14.5 11.6 - 15.2 seconds   INR 1.11 0.00 - 1.49  APTT     Status: None   Collection Time: 11/14/15  3:58 PM  Result Value Ref Range   aPTT 36 24 - 37 seconds  CBC     Status: Abnormal   Collection Time: 11/14/15  3:58 PM  Result Value Ref Range   WBC 8.7 4.0 - 10.5 K/uL   RBC 4.04 3.87 - 5.11 MIL/uL   Hemoglobin 11.2 (L) 12.0 - 15.0 g/dL   HCT 35.2 (L) 36.0 - 46.0 %   MCV 87.1 78.0 - 100.0 fL   MCH 27.7 26.0 - 34.0 pg   MCHC 31.8 30.0 - 36.0 g/dL   RDW 14.1 11.5 - 15.5 %   Platelets 229 150 - 400 K/uL  Differential     Status: None   Collection Time: 11/14/15  3:58 PM  Result Value Ref Range   Neutrophils Relative % 67 %   Neutro Abs 6.0 1.7 - 7.7 K/uL   Lymphocytes Relative 22 %   Lymphs Abs 1.9 0.7 - 4.0 K/uL   Monocytes Relative 8 %   Monocytes Absolute 0.7 0.1 - 1.0 K/uL   Eosinophils Relative 2  %   Eosinophils Absolute 0.2 0.0 - 0.7 K/uL   Basophils Relative 1 %   Basophils Absolute 0.0 0.0 - 0.1 K/uL  Comprehensive metabolic panel     Status: Abnormal   Collection Time: 11/14/15  3:58 PM  Result Value Ref Range   Sodium 136 135 - 145 mmol/L   Potassium 4.3 3.5 - 5.1 mmol/L   Chloride 100 (L) 101 - 111 mmol/L   CO2 27 22 - 32 mmol/L   Glucose, Bld 114 (H) 65 - 99 mg/dL   BUN 16 6 - 20 mg/dL   Creatinine, Ser 1.32 (H) 0.44 - 1.00 mg/dL   Calcium 8.9 8.9 - 10.3 mg/dL   Total Protein 6.7 6.5 - 8.1 g/dL   Albumin 3.6 3.5 - 5.0 g/dL   AST 16 15 - 41 U/L   ALT 13 (L) 14 - 54 U/L   Alkaline Phosphatase 51 38 - 126 U/L   Total Bilirubin 0.2 (L) 0.3 - 1.2 mg/dL   GFR calc non Af Amer 36 (L) >60 mL/min   GFR calc Af Amer 41 (L) >60 mL/min   Anion gap 9 5 - 15  I-stat troponin, ED (not at [facility], [facility])     Status: None   Collection Time: 11/14/15  4:01 PM  Result Value Ref Range   Troponin i, poc 0.00 0.00 - 0.08 ng/mL   Comment 3          I-Stat Chem 8, ED  (not at [facility], [facility])     Status: Abnormal   Collection Time: 11/14/15  4:03 PM  Result Value Ref Range   Sodium 136 135 - 145 mmol/L   Potassium 4.2 3.5 - 5.1 mmol/L   Chloride 98 (L) 101 - 111 mmol/L   BUN 19 6 - 20 mg/dL   Creatinine, Ser 1.30 (H) 0.44 - 1.00 mg/dL   Glucose, Bld 110 (H) 65 - 99 mg/dL   Calcium, Ion 1.04 (L) 1.13 - 1.30 mmol/L   TCO2 25 0 - 100 mmol/L   Hemoglobin 12.2 12.0 - 15.0 g/dL  HCT 36.0 36.0 - 46.0 %  Urine rapid drug screen (hosp performed)not at [facility]     Status: None   Collection Time: 11/14/15  6:39 PM  Result Value Ref Range   Opiates NONE DETECTED NONE DETECTED   Cocaine NONE DETECTED NONE DETECTED   Benzodiazepines NONE DETECTED NONE DETECTED   Amphetamines NONE DETECTED NONE DETECTED   Tetrahydrocannabinol NONE DETECTED NONE DETECTED   Barbiturates NONE DETECTED NONE DETECTED  Urinalysis, Routine w reflex microscopic (not at [facility])     Status: Abnormal   Collection Time: 11/14/15   6:39 PM  Result Value Ref Range   Color, Urine YELLOW YELLOW   APPearance HAZY (A) CLEAR   Specific Gravity, Urine 1.015 1.005 - 1.030   pH 6.5 5.0 - 8.0   Glucose, UA NEGATIVE NEGATIVE mg/dL   Hgb urine dipstick TRACE (A) NEGATIVE   Bilirubin Urine NEGATIVE NEGATIVE   Ketones, ur NEGATIVE NEGATIVE mg/dL   Protein, ur NEGATIVE NEGATIVE mg/dL   Nitrite NEGATIVE NEGATIVE   Leukocytes, UA MODERATE (A) NEGATIVE  Urine microscopic-add on     Status: Abnormal   Collection Time: 11/14/15  6:39 PM  Result Value Ref Range   Squamous Epithelial / LPF 0-5 (A) NONE SEEN   WBC, UA 6-30 0 - 5 WBC/hpf   RBC / HPF 0-5 0 - 5 RBC/hpf   Bacteria, UA MANY (A) NONE SEEN  Glucose, capillary     Status: None   Collection Time: 11/14/15  9:26 PM  Result Value Ref Range   Glucose-Capillary 88 65 - 99 mg/dL  Troponin I (q 6hr x 3)     Status: None   Collection Time: 11/14/15 10:31 PM  Result Value Ref Range   Troponin I <0.03 <0.031 ng/mL  Troponin I (q 6hr x 3)     Status: None   Collection Time: 11/15/15  4:07 AM  Result Value Ref Range   Troponin I <0.03 <0.031 ng/mL  Comprehensive metabolic panel     Status: Abnormal   Collection Time: 11/15/15  4:07 AM  Result Value Ref Range   Sodium 138 135 - 145 mmol/L   Potassium 4.1 3.5 - 5.1 mmol/L   Chloride 102 101 - 111 mmol/L   CO2 29 22 - 32 mmol/L   Glucose, Bld 112 (H) 65 - 99 mg/dL   BUN 13 6 - 20 mg/dL   Creatinine, Ser 1.24 (H) 0.44 - 1.00 mg/dL   Calcium 8.9 8.9 - 10.3 mg/dL   Total Protein 6.3 (L) 6.5 - 8.1 g/dL   Albumin 3.3 (L) 3.5 - 5.0 g/dL   AST 13 (L) 15 - 41 U/L   ALT 11 (L) 14 - 54 U/L   Alkaline Phosphatase 46 38 - 126 U/L   Total Bilirubin 0.3 0.3 - 1.2 mg/dL   GFR calc non Af Amer 38 (L) >60 mL/min   GFR calc Af Amer 45 (L) >60 mL/min   Anion gap 7 5 - 15  CBC     Status: Abnormal   Collection Time: 11/15/15  4:07 AM  Result Value Ref Range   WBC 6.9 4.0 - 10.5 K/uL   RBC 4.01 3.87 - 5.11 MIL/uL   Hemoglobin 11.0 (L)  12.0 - 15.0 g/dL   HCT 35.0 (L) 36.0 - 46.0 %   MCV 87.3 78.0 - 100.0 fL   MCH 27.4 26.0 - 34.0 pg   MCHC 31.4 30.0 - 36.0 g/dL   RDW 14.2 11.5 - 15.5 %  Platelets 219 150 - 400 K/uL  Lipid panel     Status: Abnormal   Collection Time: 11/15/15  4:07 AM  Result Value Ref Range   Cholesterol 138 0 - 200 mg/dL   Triglycerides 210 (H) <150 mg/dL   HDL 34 (L) >40 mg/dL   Total CHOL/HDL Ratio 4.1 RATIO   VLDL 42 (H) 0 - 40 mg/dL   LDL Cholesterol 62 0 - 99 mg/dL  Glucose, capillary     Status: Abnormal   Collection Time: 11/15/15  8:06 AM  Result Value Ref Range   Glucose-Capillary 108 (H) 65 - 99 mg/dL    EKG: NSR  IMAGING: Ct Head Wo Contrast  11/14/2015  CLINICAL DATA:  Code stroke.  Right-sided facial numbness EXAM: CT HEAD WITHOUT CONTRAST TECHNIQUE: Contiguous axial images were obtained from the base of the skull through the vertex without intravenous contrast. COMPARISON:  12/24/2014 FINDINGS: Prominence of the sulci are identified. There is mild diffuse low-attenuation within the subcortical and periventricular white matter compatible with chronic microvascular disease. No evidence for acute brain infarct, intracranial hemorrhage or mass. No abnormal extra-axial fluid collections identified. There is a fluid level within the left maxillary sinus. The mastoid air cells appear clear. IMPRESSION: 1. No acute intracranial abnormalities. 2. Left maxillary sinus fluid level. These results were called by telephone at the time of interpretation on 11/14/2015 at 4:14 pm to Dr. Leonel Ramsay, who verbally acknowledged these results. Electronically Signed   By: Kerby Moors M.D.   On: 11/14/2015 16:15   Mr Brain Wo Contrast  11/15/2015  CLINICAL DATA:  Chest pain and LEFT-sided numbness beginning at 0300 hours, and improving. History of atrial fibrillation, hypertension, hyperlipidemia, diabetes. EXAM: MRI HEAD WITHOUT CONTRAST TECHNIQUE: Multiplanar, multiecho pulse sequences of the brain and  surrounding structures were obtained without intravenous contrast. COMPARISON:  CT head November 14, 2015 at 1601 hours and CT head March 24, 2013 FINDINGS: The ventricles and sulci are normal for patient's age. No abnormal parenchymal signal, mass lesions, mass effect. Patchy supratentorial and pontine white matter T2 hyperintensities. Old small bilateral cerebellar infarcts. No reduced diffusion to suggest acute ischemia. No susceptibility artifact to suggest hemorrhage. No abnormal extra-axial fluid collections. No extra-axial masses though, contrast enhanced sequences would be more sensitive. Normal major intracranial vascular flow voids seen at the skull base. Ocular globes and orbital contents are normal though not tailored for evaluation. Status post bilateral ocular lens implants. No abnormal sellar expansion. No suspicious calvarial bone marrow signal. Craniocervical junction maintained. Paranasal sinus mucosal thickening with LEFT maxillary sinus air-fluid level, status post FESS. Included view of the cervical spine demonstrates dilated central spinal canal at 2 mm. IMPRESSION: No acute intracranial process. Mild chronic small vessel ischemic disease. Old small cerebellar infarcts. Dilated central spinal canal at 2 mm, equivocal for syringohydromyelia. This would be better characterized on the MRI of the cervical spine on a nonemergent basis as clinically indicated. Electronically Signed   By: Elon Alas M.D.   On: 11/15/2015 00:31   Ct Angio Chest Aorta W/cm &/or Wo/cm  11/14/2015  CLINICAL DATA:  Pt having left sided chest pain radiating to back started last night with numbness in left arm. Pt has had a cough x 2 months. Pt denies sob. EXAM: CT ANGIOGRAPHY CHEST WITH CONTRAST TECHNIQUE: Multidetector CT imaging of the chest was performed using the standard protocol during bolus administration of intravenous contrast. Multiplanar CT image reconstructions and MIPs were obtained to evaluate the  vascular anatomy. CONTRAST:  54mL OMNIPAQUE IOHEXOL 350 MG/ML SOLN COMPARISON:  Chest CT, 07/11/2015 FINDINGS: Angiographic study: On the unenhanced images there is mild atherosclerotic calcifications along the aortic arch and descending thoracic aorta. The aorta is normal in caliber. There are mild coronary artery calcifications. The postcontrast images, there is no evidence of an aortic dissection. Aorta is widely patent. There is a standard branching pattern of for the aortic arch vessels. Mild partly calcified plaque is noted at the origin of the left subclavian artery without stenosis. More significant atherosclerotic plaque is noted in the upper abdominal aorta and at the origins of the celiac axis and superior mesenteric artery. No evidence of a significant stenosis. Neck pain and axilla: No mass or adenopathy. Thyroid is unremarkable. Mediastinum and hila: A heart normal in size and configuration. Great vessels are normal in caliber. New no radiates down or hilar masses or pathologically enlarged lymph nodes. Lungs and pleura: There are several small pulmonary nodules. There is a 4 mm nodule in the right upper lobe near the apex, image 20, series 6. There is a 3 mm nodule left upper lobe, image 41. There is a 4 mm nodule in the left lower lobe, image 69. A focal area of opacity lies in the inferior right upper lobe adjacent to the minor fissure. A somewhat ill-defined solid component of this, discoid in shape based on the sagittal coronal reconstructed images, measures 14 mm in greatest dimension. This is stable from the prior study. There are few other very small nodules. There are areas of mild peripheral interstitial thickening. There is no evidence of pneumonia or edema. No pleural effusion or pneumothorax. Limited upper abdomen:  Unremarkable. Musculoskeletal:  Unremarkable. Review of the MIP images confirms the above findings. IMPRESSION: 1. No evidence of aortic dissection or aortic aneurysm. 2. No  acute findings. 3. Scattered small lung nodules similar to the prior study. Persistent opacity within the inferior right upper lobe adjacent to the minor fissure. No evidence of pneumonia or pulmonary edema. Electronically Signed   By: Lajean Manes M.D.   On: 11/14/2015 17:28    IMPRESSION: Principal Problem:   Chest pain with moderate risk of acute coronary syndrome Active Problems:   Type 2 diabetes mellitus with renal manifestations (HCC)   Left ventricular dysfunction   Essential hypertension   Chronic kidney disease, stage III (moderate)   Paroxysmal atrial fibrillation (HCC)   Left upper extremity numbness   HLD (hyperlipidemia)   Chronic anticoagulation   Diabetic peripheral neuropathy associated with type 2 diabetes mellitus ([place])   RECOMMENDATION: Will repeat [place]  Time Spent Directly with Patient: 42 minutes  Kerin Ransom, [place] beeper 11/15/2015, 9:52 AM    I have personally seen and examined this patient with Kerin Ransom, PA-C. I agree with the assessment and plan as outlined above. She is admitted with chest pain. Left sided and lasted for 30 minutes. Similar episode 3 months age. Exam is normal. Troponin negative. EKG without acute ischemic changes. I suspect her pain is not cardiac related but given risk factors for CAD will arrange Lexiscan nuclear stress test today.   Jessey Stehlin 11/15/2015 10:02 AM

## 2015-11-15 NOTE — Progress Notes (Signed)
Subjective: No complaints. Symptoms fully resolved.   Exam: Filed Vitals:   11/15/15 0445 11/15/15 0451  BP: 155/68   Pulse: 87   Temp:  98 F (36.7 C)  Resp: 17     HEENT-  Normocephalic, no lesions, without obvious abnormality.  Normal external eye and conjunctiva.  Normal TM's bilaterally.  Normal auditory canals and external ears. Normal external nose, mucus membranes and septum.  Normal pharynx. Cardiovascular- S1, S2 normal, pulses palpable throughout   Lungs- chest clear, no wheezing, rales, normal symmetric air entry Abdomen- normal findings: bowel sounds normal Extremities- no edema Lymph-no adenopathy palpable Musculoskeletal-no joint tenderness, deformity or swelling Skin-warm and dry, no hyperpigmentation, vitiligo, or suspicious lesions    Gen: In bed, NAD MS: alert and oriented, follows commands.  CN: PERRLA, EOMI, TML, face symmetric, speech clear.  Motor: MAEW Sensory: intact throughout   Pertinent Labs/studies: MRI brain: IMPRESSION: No acute intracranial process.  Mild chronic small vessel ischemic disease. Old small cerebellar infarcts.  Dilated central spinal canal at 2 mm, equivocal for syringohydromyelia. This would be better characterized on the MRI of the cervical spine on a nonemergent basis as clinically indicated.   LDL 61 A1c pending Carotid doppler pending Echo pending  Etta Quill PA-C [facility] [phone]  Impression: 82 year old female with a history of transient left-sided numbness. The conjunction with chest pain is unusual, however difficult to rule out TIA and I would favor treating it as such. Her LDL is below 70, so I would not make any changes to this.   Recommendations:  1) follow-up Dopplers, echo. If no significant stenosis or source of embolus on echo, then neurology will sign off. 2) continue apixaban  Roland Rack, MD [facility] [phone]  If 7pm- 7am, please page  neurology on call as listed in [place].  11/15/2015, 9:19 AM

## 2015-11-15 NOTE — Progress Notes (Signed)
Initial Nutrition Assessment  DOCUMENTATION CODES:   Not applicable  INTERVENTION:    Continue Ensure Enlive PO BID, each supplement provides 350 kcal and 20 grams of protein  NUTRITION DIAGNOSIS:   Unintentional weight loss related to chronic illness as evidenced by 8% weight loss within the past 3 months.  GOAL:   Patient will meet greater than or equal to 90% of their needs  MONITOR:   PO intake, Supplement acceptance, Labs, Weight trends  REASON FOR ASSESSMENT:   Malnutrition Screening Tool    ASSESSMENT:   82 y.o. female with history of cardiomyopathy with last EF measured was 30-35% in September 2016, paroxysmal atrial fibrillation, chronic kidney disease, diabetes mellitus presents to the ER because of ongoing chest pain and left-sided upper extremity numbness.   Unable to complete Nutrition-Focused physical exam at this time. Unable to speak with patient, she was at a procedure. She appears to have had significant weight loss over the past 10 months, 8% loss over the past 3 months. Per documentation in EMR, patient likes chocolate Ensure.  Diet Order:  Diet heart healthy/carb modified Room service appropriate?: Yes; Fluid consistency:: Thin  Skin:  Reviewed, no issues  Last BM:  1/31  Height:   Ht Readings from Last 1 Encounters:  11/14/15 5\' 5"  (1.651 m)    Weight:   Wt Readings from Last 1 Encounters:  11/15/15 153 lb 12.8 oz (69.763 kg)    Ideal Body Weight:  56.8 kg  BMI:  Body mass index is 25.59 kg/(m^2).  Estimated Nutritional Needs:   Kcal:  1600-1800  Protein:  80-90 gm  Fluid:  1.6-1.8 L  EDUCATION NEEDS:   No education needs identified at this time  Molli Barrows, [place], [place], [place] Pager [phone] After Hours Pager [phone]

## 2015-11-15 NOTE — Progress Notes (Signed)
[facility] PROGRESS NOTE  Melissa [name] [id number] DOB: 1928-08-14 DOA: 11/14/2015 PCP: Melissa Coma, MD  Assessment/Plan:  Principal Problem:   Chest pain with moderate risk of acute coronary syndrome: cardiolyte pending. None further. MI ruled out. Active Problems:   Type 2 diabetes mellitus with renal manifestations (Melissa [name]): controlled. Cont SSI   HLD (hyperlipidemia)   Left ventricular dysfunction: euvolemic.   Essential hypertension: BP running high. Adjusting meds   Chronic kidney disease, stage III (moderate)   Paroxysmal atrial fibrillation (HCC)   Chronic anticoagulation   Diabetic peripheral neuropathy associated with type 2 diabetes mellitus (HCC)   Left upper extremity numbness: MRI negative. Awaiting echo, carotid doppler, hgb a1c. LDL 62 Acute sinusitis. Will treat with abx, flonase, as symptoms present for over 2 months  HPI/Subjective: No CP or numbness. C/o bilateral maxillary sinus, frontal sinus pressure, cough, post nasal drip for 2 months. Has been taking otc cough medicine  Objective: Filed Vitals:   11/15/15 1209 11/15/15 1211  BP: 164/80 174/94  Pulse:    Temp:    Resp:      Intake/Output Summary (Last 24 hours) at 11/15/15 1400 Last data filed at 11/15/15 1334  Gross per 24 hour  Intake    320 ml  Output   1450 ml  Net  -1130 ml   Filed Weights   11/14/15 1619 11/14/15 2040 11/15/15 0453  Weight: 73.573 kg (162 lb 3.2 oz) 71.169 kg (156 lb 14.4 oz) 69.763 kg (153 lb 12.8 oz)    Exam:   General:  Coughing. A and o.  Cardiovascular: RRR without MGR  Respiratory: CTA without WRR  Abdomen: S, NT, ND  Ext: no CCE  Neuro: nonfocal  Basic Metabolic Panel:  Recent Labs Lab 11/14/15 1558 11/14/15 1603 11/15/15 0407  NA 136 136 138  K 4.3 4.2 4.1  CL 100* 98* 102  CO2 27  --  29  GLUCOSE 114* 110* 112*  BUN 16 19 13   CREATININE 1.32* 1.30* 1.24*  CALCIUM 8.9  --  8.9   Liver Function Tests:  Recent Labs Lab  11/14/15 1558 11/15/15 0407  AST 16 13*  ALT 13* 11*  ALKPHOS 51 46  BILITOT 0.2* 0.3  PROT 6.7 6.3*  ALBUMIN 3.6 3.3*   No results for input(s): LIPASE, AMYLASE in the last 168 hours. No results for input(s): AMMONIA in the last 168 hours. CBC:  Recent Labs Lab 11/14/15 1558 11/14/15 1603 11/15/15 0407  WBC 8.7  --  6.9  NEUTROABS 6.0  --   --   HGB 11.2* 12.2 11.0*  HCT 35.2* 36.0 35.0*  MCV 87.1  --  87.3  PLT 229  --  219   Cardiac Enzymes:  Recent Labs Lab 11/14/15 2231 11/15/15 0407  TROPONINI <0.03 <0.03   BNP (last 3 results) No results for input(s): BNP in the last 8760 hours.  ProBNP (last 3 results) No results for input(s): PROBNP in the last 8760 hours.  CBG:  Recent Labs Lab 11/14/15 2126 11/15/15 0806 11/15/15 1300  GLUCAP 88 108* 137*    No results found for this or any previous visit (from the past 240 hour(s)).   Studies: Ct Head Wo Contrast  11/14/2015  CLINICAL DATA:  Code stroke.  Right-sided facial numbness EXAM: CT HEAD WITHOUT CONTRAST TECHNIQUE: Contiguous axial images were obtained from the base of the skull through the vertex without intravenous contrast. COMPARISON:  12/24/2014 FINDINGS: Prominence of the sulci are identified. There is mild diffuse low-attenuation  within the subcortical and periventricular white matter compatible with chronic microvascular disease. No evidence for acute brain infarct, intracranial hemorrhage or mass. No abnormal extra-axial fluid collections identified. There is a fluid level within the left maxillary sinus. The mastoid air cells appear clear. IMPRESSION: 1. No acute intracranial abnormalities. 2. Left maxillary sinus fluid level. These results were called by telephone at the time of interpretation on 11/14/2015 at 4:14 pm to Dr. Leonel [name], who verbally acknowledged these results. Electronically Signed   By: Melissa [name] M.D.   On: 11/14/2015 16:15   Mr Melissa [name] Wo Contrast  11/15/2015  CLINICAL  DATA:  82 year old female with left side numbness. Initial encounter. EXAM: MRA HEAD WITHOUT CONTRAST TECHNIQUE: Angiographic images of the Circle of Willis were obtained using MRA technique without intravenous contrast. COMPARISON:  Brain MRI 11/14/2015 and earlier. FINDINGS: No intracranial mass effect or ventriculomegaly. Chronic cerebellar infarcts re - demonstrated. Antegrade flow in the posterior circulation with dominant distal left vertebral artery. No distal vertebral artery stenosis. Normal PICA origins and vertebrobasilar junction. Normal AICA origins and SCA origins. No basilar stenosis. Bilateral fetal type PCA origins, more so the left. Bilateral PCA branches are within normal limits. Antegrade flow in both ICA siphons. No siphon stenosis. Normal posterior communicating artery origins. Patent carotid termini. Normal MCA and ACA origins. Anterior communicating artery and visualized ACA branches are within normal limits. Visualized bilateral MCA branches are within normal limits. IMPRESSION: Negative for age intracranial MRA. Electronically Signed   By: Melissa [name] M.D.   On: 11/15/2015 11:24   Mr Brain Wo Contrast  11/15/2015  CLINICAL DATA:  Chest pain and LEFT-sided numbness beginning at 0300 hours, and improving. History of atrial fibrillation, hypertension, hyperlipidemia, diabetes. EXAM: MRI HEAD WITHOUT CONTRAST TECHNIQUE: Multiplanar, multiecho pulse sequences of the brain and surrounding structures were obtained without intravenous contrast. COMPARISON:  CT head November 14, 2015 at 1601 hours and CT head March 24, 2013 FINDINGS: The ventricles and sulci are normal for patient's age. No abnormal parenchymal signal, mass lesions, mass effect. Patchy supratentorial and pontine white matter T2 hyperintensities. Old small bilateral cerebellar infarcts. No reduced diffusion to suggest acute ischemia. No susceptibility artifact to suggest hemorrhage. No abnormal extra-axial fluid collections. No  extra-axial masses though, contrast enhanced sequences would be more sensitive. Normal major intracranial vascular flow voids seen at the skull base. Ocular globes and orbital contents are normal though not tailored for evaluation. Status post bilateral ocular lens implants. No abnormal sellar expansion. No suspicious calvarial bone marrow signal. Craniocervical junction maintained. Paranasal sinus mucosal thickening with LEFT maxillary sinus air-fluid level, status post FESS. Included view of the cervical spine demonstrates dilated central spinal canal at 2 mm. IMPRESSION: No acute intracranial process. Mild chronic small vessel ischemic disease. Old small cerebellar infarcts. Dilated central spinal canal at 2 mm, equivocal for syringohydromyelia. This would be better characterized on the MRI of the cervical spine on a nonemergent basis as clinically indicated. Electronically Signed   By: Elon Alas M.D.   On: 11/15/2015 00:31   Ct Angio Chest Aorta W/cm &/or Wo/cm  11/14/2015  CLINICAL DATA:  Pt having left sided chest pain radiating to back started last night with numbness in left arm. Pt has had a cough x 2 months. Pt denies sob. EXAM: CT ANGIOGRAPHY CHEST WITH CONTRAST TECHNIQUE: Multidetector CT imaging of the chest was performed using the standard protocol during bolus administration of intravenous contrast. Multiplanar CT image reconstructions and MIPs were obtained to evaluate the  vascular anatomy. CONTRAST:  32mL OMNIPAQUE IOHEXOL 350 MG/ML SOLN COMPARISON:  Chest CT, 07/11/2015 FINDINGS: Angiographic study: On the unenhanced images there is mild atherosclerotic calcifications along the aortic arch and descending thoracic aorta. The aorta is normal in caliber. There are mild coronary artery calcifications. The postcontrast images, there is no evidence of an aortic dissection. Aorta is widely patent. There is a standard branching pattern of for the aortic arch vessels. Mild partly calcified plaque  is noted at the origin of the left subclavian artery without stenosis. More significant atherosclerotic plaque is noted in the upper abdominal aorta and at the origins of the celiac axis and superior mesenteric artery. No evidence of a significant stenosis. Neck pain and axilla: No mass or adenopathy. Thyroid is unremarkable. Mediastinum and hila: A heart normal in size and configuration. Great vessels are normal in caliber. New no radiates down or hilar masses or pathologically enlarged lymph nodes. Lungs and pleura: There are several small pulmonary nodules. There is a 4 mm nodule in the right upper lobe near the apex, image 20, series 6. There is a 3 mm nodule left upper lobe, image 41. There is a 4 mm nodule in the left lower lobe, image 69. A focal area of opacity lies in the inferior right upper lobe adjacent to the minor fissure. A somewhat ill-defined solid component of this, discoid in shape based on the sagittal coronal reconstructed images, measures 14 mm in greatest dimension. This is stable from the prior study. There are few other very small nodules. There are areas of mild peripheral interstitial thickening. There is no evidence of pneumonia or edema. No pleural effusion or pneumothorax. Limited upper abdomen:  Unremarkable. Musculoskeletal:  Unremarkable. Review of the MIP images confirms the above findings. IMPRESSION: 1. No evidence of aortic dissection or aortic aneurysm. 2. No acute findings. 3. Scattered small lung nodules similar to the prior study. Persistent opacity within the inferior right upper lobe adjacent to the minor fissure. No evidence of pneumonia or pulmonary edema. Electronically Signed   By: Lajean Manes M.D.   On: 11/14/2015 17:28    Scheduled Meds: . apixaban  2.5 mg Oral BID  . atorvastatin  40 mg Oral q1800  . cholecalciferol  1,000 Units Oral Daily  . feeding supplement (ENSURE ENLIVE)  237 mL Oral BID BM  . ferrous Q000111Q C-folic acid  1 capsule Oral  Q breakfast  . FLUoxetine  20 mg Oral Daily  . insulin aspart  0-9 Units Subcutaneous TID WC  . insulin detemir  15 Units Subcutaneous QHS  . levothyroxine  50 mcg Oral QAC breakfast  . Liraglutide  1.2 mg Subcutaneous QHS  . losartan  25 mg Oral Daily  . metoprolol succinate  50 mg Oral Daily  . pantoprazole  40 mg Oral Daily  . pramipexole  0.25 mg Oral QHS  . pregabalin  50 mg Oral BID  . regadenoson      . traZODone  50 mg Oral QHS   Continuous Infusions:   Time spent: 35 minutes  [facility] [email or web address], password [facility] 11/15/2015, 2:00 PM  LOS: 1 day

## 2015-11-15 NOTE — Plan of Care (Signed)
Problem: Education: Goal: Knowledge of [facility] Education information/materials will improve Outcome: Progressing RN orientated patient to room surroundings and use of call light.  RN explained to patient how to call staff using the white numbers provided on the board and the hospital provided phone in her room.  Patient stated understanding.  RN reviewed plan of care with patient.  Patient stated understanding.  Patient has denied pain since being on the unit.  RN explained to patient if she started to experience any pain/discomfort to let staff know right away.  Patient stated understanding.  RN discussed medications with patient that have been administered thus far this shift.

## 2015-11-15 NOTE — Progress Notes (Signed)
PT Cancellation Note  Patient Details Name: Melissa [name] MRN: [id number] DOB: 1928-08-13   Cancelled Treatment:    Reason Eval/Treat Not Completed: Patient at procedure or test/unavailable.  Will see after MRI is completed as pt and time allows.   Ramond Dial 11/15/2015, 9:50 AM   Mee Hives, PT MS Acute Rehab Dept. Number: [facility] [id number] and [place] [phone]

## 2015-11-16 ENCOUNTER — Ambulatory Visit (HOSPITAL_BASED_OUTPATIENT_CLINIC_OR_DEPARTMENT_OTHER): Payer: Medicare Other

## 2015-11-16 DIAGNOSIS — Z8673 Personal history of transient ischemic attack (TIA), and cerebral infarction without residual deficits: Secondary | ICD-10-CM

## 2015-11-16 DIAGNOSIS — G459 Transient cerebral ischemic attack, unspecified: Secondary | ICD-10-CM

## 2015-11-16 DIAGNOSIS — R079 Chest pain, unspecified: Secondary | ICD-10-CM | POA: Diagnosis not present

## 2015-11-16 DIAGNOSIS — I420 Dilated cardiomyopathy: Secondary | ICD-10-CM | POA: Diagnosis not present

## 2015-11-16 DIAGNOSIS — R0789 Other chest pain: Secondary | ICD-10-CM | POA: Diagnosis not present

## 2015-11-16 LAB — GLUCOSE, CAPILLARY
GLUCOSE-CAPILLARY: 138 mg/dL — AB (ref 65–99)
Glucose-Capillary: 100 mg/dL — ABNORMAL HIGH (ref 65–99)

## 2015-11-16 LAB — HEMOGLOBIN A1C
Hgb A1c MFr Bld: 6 % — ABNORMAL HIGH (ref 4.8–5.6)
Mean Plasma Glucose: 126 mg/dL

## 2015-11-16 MED ORDER — FLUTICASONE PROPIONATE 50 MCG/ACT NA SUSP: 2.0000 | Freq: Every day | NASAL | Status: DC

## 2015-11-16 MED ORDER — AMOXICILLIN-POT CLAVULANATE 875-125 MG PO TABS: 1.0000 | ORAL_TABLET | Freq: Two times a day (BID) | ORAL | Status: DC

## 2015-11-16 NOTE — Progress Notes (Signed)
Patient discharged home with daughter. No further questions at this time.

## 2015-11-16 NOTE — Progress Notes (Signed)
Echocardiogram 2D Echocardiogram has been performed.  Melissa [name] 11/16/2015, 2:27 PM

## 2015-11-16 NOTE — Progress Notes (Signed)
Subjective:  No chest pain this am  Objective:  Vital Signs in the last 24 hours: Temp:  [97.3 F (36.3 C)-98.2 F (36.8 C)] 97.6 F (36.4 C) (02/02 0500) Pulse Rate:  [84-85] 84 (02/02 0500) Resp:  [16-20] 18 (02/02 0500) BP: (120-174)/(53-94) 125/64 mmHg (02/02 0500) SpO2:  [94 %-99 %] 99 % (02/02 0500) Weight:  [154 lb 1.6 oz (69.9 kg)] 154 lb 1.6 oz (69.9 kg) (02/02 0500)  Intake/Output from previous day:  Intake/Output Summary (Last 24 hours) at 11/16/15 0826 Last data filed at 11/16/15 0551  Gross per 24 hour  Intake    570 ml  Output   1075 ml  Net   -505 ml    Physical Exam: General appearance: alert, cooperative and no distress Neck: no JVD Lungs: clear to auscultation bilaterally Heart: regular rate and rhythm Extremities: no edema Neurologic: Grossly normal   Rate: 82  Rhythm: normal sinus rhythm  Lab Results:  Recent Labs  11/14/15 1558 11/14/15 1603 11/15/15 0407  WBC 8.7  --  6.9  HGB 11.2* 12.2 11.0*  PLT 229  --  219    Recent Labs  11/14/15 1558 11/14/15 1603 11/15/15 0407  NA 136 136 138  K 4.3 4.2 4.1  CL 100* 98* 102  CO2 27  --  29  GLUCOSE 114* 110* 112*  BUN 16 19 13   CREATININE 1.32* 1.30* 1.24*    Recent Labs  11/15/15 0407 11/15/15 1513  TROPONINI <0.03 <0.03    Recent Labs  11/14/15 1558  INR 1.11    Scheduled Meds: . amoxicillin-clavulanate  1 tablet Oral Q12H  . apixaban  2.5 mg Oral BID  . atorvastatin  40 mg Oral q1800  . cholecalciferol  1,000 Units Oral Daily  . feeding supplement (ENSURE ENLIVE)  237 mL Oral BID BM  . ferrous Q000111Q C-folic acid  1 capsule Oral Q breakfast  . FLUoxetine  20 mg Oral Daily  . fluticasone  2 spray Each Nare Daily  . insulin aspart  0-9 Units Subcutaneous TID WC  . insulin detemir  15 Units Subcutaneous QHS  . levothyroxine  50 mcg Oral QAC breakfast  . Liraglutide  1.2 mg Subcutaneous QHS  . losartan  25 mg Oral Daily  . metoprolol succinate  50  mg Oral Daily  . pantoprazole  40 mg Oral Daily  . pramipexole  0.25 mg Oral QHS  . pregabalin  50 mg Oral BID  . traZODone  50 mg Oral QHS   Continuous Infusions:  PRN Meds:.acetaminophen, albuterol, ipratropium, levalbuterol, senna-docusate   Imaging: Ct Head Wo Contrast  11/14/2015  CLINICAL DATA:  Code stroke.  Right-sided facial numbness EXAM: CT HEAD WITHOUT CONTRAST  IMPRESSION: 1. No acute intracranial abnormalities. 2. Left maxillary sinus fluid level. These results were called by telephone at the time of interpretation on 11/14/2015 at 4:14 pm to Dr. Leonel Ramsay, who verbally acknowledged these results. Electronically Signed   By: Kerby Moors M.D.   On: 11/14/2015 16:15   Mr [name] Wo Contrast  11/15/2015  CLINICAL DATA:  82 year old female with left side numbness. Initial encounter. EXAM: MRA HEAD WITHOUT CONTRAST TECHNIQUE: Angiographic images of the Circle of Willis were obtained using MRA technique without intravenous contrast. COMPARISON:  Brain MRI 11/14/2015 and earlier. FINDINGS: No intracranial mass effect or ventriculomegaly. Chronic cerebellar infarcts re - demonstrated. Antegrade flow in the posterior circulation with dominant distal left vertebral artery. No distal vertebral artery stenosis. Normal PICA origins and  vertebrobasilar junction. Normal AICA origins and SCA origins. No basilar stenosis. Bilateral fetal type PCA origins, more so the left. Bilateral PCA branches are within normal limits. Antegrade flow in both ICA siphons. No siphon stenosis. Normal posterior communicating artery origins. Patent carotid termini. Normal MCA and ACA origins. Anterior communicating artery and visualized ACA branches are within normal limits. Visualized bilateral MCA branches are within normal limits. IMPRESSION: Negative for age intracranial MRA. Electronically Signed   By: Genevie Ann M.D.   On: 11/15/2015 11:24   Mr Brain Wo Contrast  11/15/2015  MRI HEAD WITHOUT CONTRAST  IMPRESSION:  No acute intracranial process. Mild chronic small vessel ischemic disease. Old small cerebellar infarcts. Dilated central spinal canal at 2 mm, equivocal for syringohydromyelia. This would be better characterized on the MRI of the cervical spine on a nonemergent basis as clinically indicated. Electronically Signed   By: Elon Alas M.D.   On: 11/15/2015 00:31   Nm Myocar Multi W/spect W/wall Motion / Ef 11/15/2015   There was no ST segment deviation noted during stress.  This is a high risk study.  The left ventricular ejection fraction is severely decreased (<30%).  Normal resting and stress perfusion EF EF 28% most consistent with non ischemic cardiomyopathy    11/14/2015  CT ANGIOGRAPHY CHEST   IMPRESSION: 1. No evidence of aortic dissection or aortic aneurysm. 2. No acute findings. 3. Scattered small lung nodules similar to the prior study. Persistent opacity within the inferior right upper lobe adjacent to the minor fissure. No evidence of pneumonia or pulmonary edema. Electronically Signed   By: Lajean Manes M.D.   On: 11/14/2015 17:28    Cardiac Studies: Myoview negative for ischemia- severe LVD ("High risk") but pt has know NICM Echo pending Brain MRI negative for acute stroke (transient Lt arm and face numbness). Old cerebellar strokes noted  Assessment/Plan:  82 y/o female with a history of cardiomyopathy- last EF 30-35%. She had a low risk Myoview in 2013. The pt says she had a "heart attack" 30 yrs ago, no history of cath or PTCA. Other problems include DM, HTN, HLD, PAF on Eliquis, and CRI-3. She was admitted 11/14/15 with Lt sided chest pain that woke her up. Troponin negative. Myoview suggests NICM. She also had transient lt arm and facial numbness and had brain MRI and carotid dopplers as code stroke protocol.    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 ? Possibly home later today- awaiting echo but not sure further work up will be indicated unless EF has significantly changed from Sept 2016.  Her B/P has been a little elelvated- consider switching Toprol to Coreg at discharge.   Kerin Ransom PA-C 11/16/2015, 8:26 AM [phone]  I have personally seen and examined this patient with Kerin Ransom, PA-C. I agree with the assessment and plan as outlined above. She has had atypical  chest pain. Nuclear stress test with no ischemia. She is known to have a cardiomyopathy with LVEF around 35% in the past. No further ischemic workup at this time. OK to d/c home unless echo has significant changes. Will sign off. Please call with questions.   Malaki Koury 11/16/2015 10:20 AM

## 2015-11-16 NOTE — Progress Notes (Signed)
OT Cancellation Note  Patient Details Name: Melissa [name] MRN: [id number] DOB: 1928-01-18   Cancelled Treatment:    Reason Eval/Treat Not Completed: OT screened, no needs identified, will sign off  Malka So 11/16/2015, 3:30 PM

## 2015-11-16 NOTE — Evaluation (Signed)
Physical Therapy Evaluation Patient Details Name: Melissa [name] MRN: [id number] DOB: 1928/04/29 Today's Date: 11/16/2015   History of Present Illness  82 yo female with onset of chest pain and LUE numbness was admitted with PMHx:  EF 30-35%, PAF, DM.    Clinical Impression  Pt was able to walk with minimal involvement of PT to mainly get her safely down hall with O2 sats checked.  Since pt can walk and get up to stand with no assistance, will allow her to continue with no further PT intervention.  However, did inform nursing to have them check her O2 for drops during mobility.      Follow Up Recommendations No PT follow up    Equipment Recommendations  None recommended by PT    Recommendations for Other Services       Precautions / Restrictions Precautions Precautions: Fall (telemetry) Restrictions Weight Bearing Restrictions: No      Mobility  Bed Mobility Overal bed mobility: Modified Independent                Transfers Overall transfer level: Modified independent Equipment used: None                Ambulation/Gait Ambulation/Gait assistance: Supervision;Min guard (for safety) Ambulation Distance (Feet): 150 Feet Assistive device: 1 person hand held assist (contact for safety) Gait Pattern/deviations: Step-through pattern;Narrow base of support;Trunk flexed Gait velocity: normal Gait velocity interpretation: at or above normal speed for age/gender    Stairs            Wheelchair Mobility    Modified Rankin (Stroke Patients Only)       Balance Overall balance assessment: No apparent balance deficits (not formally assessed)                                           Pertinent Vitals/Pain Pain Assessment: No/denies pain    Home Living Family/patient expects to be discharged to:: Private residence Living Arrangements: Children Available Help at Discharge: Family;Available 24 hours/day Type of Home: House Home Access:  Stairs to enter Entrance Stairs-Rails: Right;Left;Can reach both Entrance Stairs-Number of Steps: 4   Home Equipment: Grab bars - tub/shower;Shower seat      Prior Function Level of Independence: Independent         Comments: independent with daughter present at eval     Hand Dominance   Dominant Hand: Right    Extremity/Trunk Assessment   Upper Extremity Assessment: Overall WFL for tasks assessed           Lower Extremity Assessment: Overall WFL for tasks assessed      Cervical / Trunk Assessment: Normal  Communication   Communication: No difficulties  Cognition Arousal/Alertness: Awake/alert Behavior During Therapy: WFL for tasks assessed/performed Overall Cognitive Status: Within Functional Limits for tasks assessed                      General Comments General comments (skin integrity, edema, etc.): Pt is walked to see if she presents instability, and O2 sats were assessed and noted to be stable at 90% post walk, down from 94% initially    Exercises        Assessment/Plan    PT Assessment Patent does not need any further PT services  PT Diagnosis Other (comment) (chest pain with potential gait changes)   PT Problem List  PT Treatment Interventions     PT Goals (Current goals can be found in the Care Plan section)      Frequency     Barriers to discharge        Co-evaluation               End of Session Equipment Utilized During Treatment: Gait belt Activity Tolerance: Patient tolerated treatment well Patient left: in bed;with call bell/phone within reach;with family/visitor present Nurse Communication: Mobility status    Functional Assessment Tool Used: clinical judgement Functional Limitation: Mobility: Walking and moving around Mobility: Walking and Moving Around Current Status [id number]): At least 1 percent but less than 20 percent impaired, limited or restricted Mobility: Walking and Moving Around Goal Status [phone]): At  least 1 percent but less than 20 percent impaired, limited or restricted    Time: 0805-0821 PT Time Calculation (min) (ACUTE ONLY): 16 min   Charges:   PT Evaluation $PT Eval Low Complexity: 1 Procedure     PT G Codes:   PT G-Codes **NOT FOR INPATIENT CLASS** Functional Assessment Tool Used: clinical judgement Functional Limitation: Mobility: Walking and moving around Mobility: Walking and Moving Around Current Status [id number]): At least 1 percent but less than 20 percent impaired, limited or restricted Mobility: Walking and Moving Around Goal Status [phone]): At least 1 percent but less than 20 percent impaired, limited or restricted    Ramond Dial 11/16/2015, 12:57 PM Mee Hives, PT MS Acute Rehab Dept. Number: [facility] [id number] and [place] [phone]

## 2015-11-16 NOTE — Progress Notes (Signed)
Noted L ICA stenosis on dopplers. This is not likely to be related to her symptoms given that it is ipsilateral. Echo pending.   Please call with any further questions or concerns.

## 2015-11-16 NOTE — Discharge Summary (Signed)
Physician Discharge Summary  Melissa [name] [id number] DOB: 01-06-1928 DOA: 11/14/2015  PCP: [name], MD  Admit date: 11/14/2015 Discharge date: 11/16/2015   Recommendations for Outpatient Follow-up:  1.    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 female with history of cardiomyopathy with last EF measured was 30-35% in September 2016, paroxysmal atrial fibrillation, chronic kidney disease, diabetes mellitus presents to the ER because of ongoing chest pain and left-sided upper extremity numbness. Patient has been having chest pain off and on for last 2 days. Lasts for a few seconds and it is under the left breast. Denies any rash around the area. Chest pain is sharp shooting in type and has no exertional symptoms. This afternoon patient also started developing left upper extremity numbness. CT angiogram of the chest today shows pulmonary nodules. CT head was negative. On-call neurologist has been consulted. Patient is presently chest pain-free and her left upper extremity numbness has resolved  Hospital Course:  Code stroke was called in the emergency room and patient was seen by  neurology. Not given TPA given anticoagulation prior to admission and resolving symptoms. MRI brain negative. Carotid Doppler showed moderate left ICA stenosis. See below. Likely unrelated to her symptoms given that it is ipsilateral. Echocardiogram showed no source of embolus. LDL 62. Hemoglobin A1c 6.0. Continue statin and apixiban.  With regard to chest pain, MI was ruled out. EKG showed no ischemic changes. Cardiology consulted and over saw nuclear stress test which was negative for ischemia. It did show reduced ejection fraction and patient has a known history of same. Echocardiogram showed ejection fraction of 35% which is about her baseline. No evidence of heart failure.  During the hospitalization, patient did complain of a two-month history of postnasal drip, frontal sinus and maxillary sinus pressure, cough. She was started on Augmentin and Flonase with good results.   Procedures:  none  Consultations:  Neurology  cardiology  Discharge Exam: Filed Vitals:   11/15/15 2310 11/16/15 0500  BP: 125/53 125/64  Pulse: 84 84  Temp: 98.2 F (36.8 C) 97.6 F (36.4 C)  Resp: 16 18    General: a and o Cardiovascular: RRR Respiratory: CTA  Discharge Instructions   Discharge Instructions    Activity as tolerated - No restrictions    Complete by:  As directed      Diet - low sodium heart healthy    Complete by:  As directed           Current Discharge Medication List    START taking these medications   Details  amoxicillin-clavulanate (AUGMENTIN) 875-125 MG tablet Take 1 tablet by mouth every 12 (twelve) hours. Qty: 8 tablet, Refills: 0  fluticasone (FLONASE) 50 MCG/ACT nasal spray Place 2 sprays into both nostrils daily. Refills: 2      CONTINUE these medications which have NOT CHANGED   Details  acetaminophen (TYLENOL) 500 MG tablet Take 1,000 mg by mouth every 6 (six) hours as needed for mild pain. Reported on 11/02/2015    albuterol (PROVENTIL) (2.5 MG/3ML)  0.083% nebulizer solution Take 2.5 mg by nebulization every 6 (six) hours as needed for wheezing or shortness of breath.     apixaban (ELIQUIS) 2.5 MG TABS tablet Take 1 tablet (2.5 mg total) by mouth 2 (two) times daily. Qty: 180 tablet, Refills: 3    atorvastatin (LIPITOR) 40 MG tablet Take 1 tablet (40 mg total) by mouth daily at 6 PM. Qty: 30 tablet, Refills: 0    Cholecalciferol (VITAMIN D3) 1000 UNITS CAPS Take 1 capsule by mouth daily.    esomeprazole (NEXIUM) 40 MG capsule Take 40 mg by mouth 2 (two) times daily.      Fe Fum-FePoly-Vit C-Vit B3 (INTEGRA) 62.5-62.5-40-3 MG CAPS Take 1 capsule by mouth daily. Refills: 3    FLUoxetine (PROZAC) 20 MG capsule Take 20 mg by mouth daily.     Insulin Aspart (NOVOLOG [place]) Inject 4 Units into the skin 3 (three) times daily.     Insulin Detemir (LEVEMIR FLEXTOUCH) 100 UNIT/ML Pen Inject 34 units at bedtime Qty: 30 mL, Refills: 1    ipratropium (ATROVENT) 0.06 % nasal spray Place 2 sprays into both nostrils 4 (four) times daily. Qty: 15 mL, Refills: 1    levalbuterol (XOPENEX) 0.63 MG/3ML nebulizer solution Take 0.63 mg by nebulization every 6 (six) hours as needed for wheezing or shortness of breath.  Refills: 0    levothyroxine (SYNTHROID, LEVOTHROID) 50 MCG tablet TAKE 1 TABLET BY MOUTH EVERY DAY Qty: 30 tablet, Refills: 2    Liraglutide (VICTOZA) 18 MG/3ML SOPN Inject 0.2 mLs (1.2 mg total) into the skin daily. Qty: 6 pen, Refills: 0    losartan (COZAAR) 25 MG tablet Take 1 tablet (25 mg total) by mouth daily. Qty: 30 tablet, Refills: 3    metoprolol succinate (TOPROL-XL) 50 MG 24 hr tablet Take 1 tablet (50 mg total) by mouth daily. Qty: 90 tablet, Refills: 3    pramipexole (MIRAPEX) 0.25 MG tablet TAKE 1 TABLET (0.25 MG TOTAL) BY MOUTH AT BEDTIME. Qty: 90 tablet, Refills: 1    pregabalin (LYRICA) 50 MG capsule Take up to 3 capsules daily as needed Qty: 270 capsule, Refills: 1   Associated Diagnoses: Diabetic  radiculopathy (HCC)    traZODone (DESYREL) 50 MG tablet Take 50 mg by mouth at bedtime.  Refills: 0    ondansetron (ZOFRAN) 4 MG tablet Take 1 tablet (4 mg total) by mouth every 8 (eight) hours as needed for nausea or vomiting. Qty: 20 tablet, Refills: 0      STOP taking these medications     ciprofloxacin (CIPRO) 250 MG tablet        No Known Allergies Follow-up Information    Follow up with [name], MD.   Specialty:  Cardiology   Why:  office will contact you   Contact information:   [address] [place] [address] [phone]       Follow up with [name], MD.   Specialty:  Family Medicine   Why:  As needed   Contact information:   [address] [place] [address] [phone]        The  results of significant diagnostics from this hospitalization (including imaging, microbiology, ancillary and laboratory) are listed below for reference.    Significant Diagnostic Studies: Ct Head Wo Contrast  11/14/2015  CLINICAL DATA:  Code stroke.  Right-sided facial numbness EXAM: CT HEAD WITHOUT CONTRAST TECHNIQUE: Contiguous axial images were obtained from the base of the skull through the vertex without intravenous contrast. COMPARISON:  12/24/2014 FINDINGS: Prominence of the sulci are identified. There is mild diffuse low-attenuation within the subcortical and periventricular white matter compatible with chronic microvascular disease. No evidence for acute brain infarct, intracranial hemorrhage or mass. No abnormal extra-axial fluid collections identified. There is a fluid level within the left maxillary sinus. The mastoid air cells appear clear. IMPRESSION: 1. No acute intracranial abnormalities. 2. Left maxillary sinus fluid level. These results were called by telephone at the time of interpretation on 11/14/2015 at 4:14 pm to Dr. Leonel Ramsay, who verbally acknowledged these results. Electronically Signed   By: Kerby Moors M.D.   On: 11/14/2015 16:15   Mr [name] Wo Contrast  11/15/2015  CLINICAL DATA:  82 year old female with left side numbness. Initial encounter. EXAM: MRA HEAD WITHOUT CONTRAST TECHNIQUE: Angiographic images of the Circle of Willis were obtained using MRA technique without intravenous contrast. COMPARISON:  Brain MRI 11/14/2015 and earlier. FINDINGS: No intracranial mass effect or ventriculomegaly. Chronic cerebellar infarcts re - demonstrated. Antegrade flow in the posterior circulation with dominant distal left vertebral artery. No distal vertebral artery stenosis. Normal PICA origins and vertebrobasilar junction. Normal AICA origins and SCA origins. No basilar stenosis. Bilateral fetal type PCA origins, more so the left. Bilateral PCA branches are within normal limits. Antegrade flow in both ICA siphons. No siphon stenosis. Normal posterior communicating artery origins. Patent carotid termini. Normal MCA and ACA origins. Anterior communicating artery and visualized ACA branches are within normal limits. Visualized bilateral MCA branches are within normal limits. IMPRESSION: Negative for age intracranial MRA. Electronically Signed   By: Genevie Ann M.D.   On: 11/15/2015 11:24   Mr Brain Wo Contrast  11/15/2015  CLINICAL DATA:  Chest pain and LEFT-sided numbness beginning at 0300 hours, and improving. History of atrial fibrillation, hypertension, hyperlipidemia, diabetes. EXAM: MRI HEAD WITHOUT CONTRAST TECHNIQUE: Multiplanar, multiecho pulse sequences of the brain and surrounding structures were obtained without intravenous contrast. COMPARISON:  CT head November 14, 2015 at 1601 hours and CT head March 24, 2013 FINDINGS: The ventricles and sulci are normal for patient's age. No abnormal parenchymal signal, mass lesions, mass effect. Patchy supratentorial and pontine white matter T2 hyperintensities. Old small bilateral cerebellar infarcts. No reduced diffusion to suggest acute ischemia. No susceptibility  artifact to suggest hemorrhage. No abnormal extra-axial fluid collections. No extra-axial masses though, contrast enhanced sequences would be more sensitive. Normal major intracranial vascular flow voids seen at the skull base. Ocular globes and orbital contents are normal though not tailored for evaluation. Status post bilateral ocular lens implants. No abnormal sellar expansion. No suspicious calvarial bone marrow signal. Craniocervical junction maintained. Paranasal sinus mucosal thickening with LEFT maxillary sinus air-fluid level, status post FESS. Included view of the cervical spine demonstrates dilated central spinal canal at 2 mm. IMPRESSION: No acute intracranial process. Mild chronic small vessel ischemic disease. Old small cerebellar infarcts. Dilated central spinal canal at 2 mm, equivocal for syringohydromyelia. This would be better characterized on the MRI of the cervical spine on a nonemergent basis as clinically indicated. Electronically Signed   By: Elon Alas M.D.   On: 11/15/2015 00:31  Ct Abdomen Pelvis W Contrast  11/09/2015  CLINICAL DATA:  Generalized abdominal pain and 18 pound weight loss over the past 3 months. EXAM: CT ABDOMEN AND PELVIS WITH CONTRAST TECHNIQUE: Multidetector CT imaging of the abdomen and pelvis was performed using the standard protocol following bolus administration of intravenous contrast. CONTRAST:  179mL ISOVUE-300 IOPAMIDOL (ISOVUE-300) INJECTION 61% COMPARISON:  CT scan 09/14/2013 FINDINGS: Lower chest: The lung bases are clear of acute process. No pleural effusion. No pulmonary nodule wounds. The heart is normal in size. No pericardial effusion. The distal esophagus is grossly normal. There is a small hiatal hernia. Hepatobiliary: No focal hepatic lesions or intrahepatic biliary dilatation. The gallbladder is normal except for small gallstone. No common bile duct dilatation. Pancreas: No mass, inflammation or ductal dilatation. Spleen: Normal size.  No  focal lesions. Adrenals/Urinary Tract: The adrenal glands and kidneys are unremarkable. No renal or obstructing ureteral calculi. No renal masses. No bladder mass. Stomach/Bowel: The stomach, duodenum, small bowel and colon are unremarkable. No inflammatory changes, mass lesions or obstructive findings. Moderate descending and sigmoid diverticulosis without findings for acute diverticulitis. The terminal ileum is normal. The appendix is surgically absent. Vascular/Lymphatic: No mesenteric or retroperitoneal mass or adenopathy. Small scattered lymph nodes are noted. Moderate atherosclerotic calcifications involving the aorta and branch vessel ostia. No aneurysm or dissection. Reproductive: The uterus is surgically absent. Both ovaries are still present. Benign-appearing cysts bilaterally. Other: No pelvic mass or adenopathy. No free pelvic fluid collections. No inguinal mass or adenopathy. Prominent inguinal rings containing fat. No abdominal wall hernia or subcutaneous lesions. Musculoskeletal: No significant bony findings. Moderate degenerative disc disease at L5-S1. IMPRESSION: 1. No acute abdominal/pelvic findings, mass lesions or adenopathy. 2. Cholelithiasis without CT findings for acute cholecystitis. 3. Status post hysterectomy and appendectomy. 4. Bilateral simple appearing ovarian cysts, unchanged since 2014. 5. Diverticulosis without findings for acute diverticulitis. Electronically Signed   By: Marijo Sanes M.D.   On: 11/09/2015 15:23   Nm Myocar Multi W/spect W/wall Motion / Ef  11/15/2015   There was no ST segment deviation noted during stress.  This is a high risk study.  The left ventricular ejection fraction is severely decreased (<30%).  Normal resting and stress perfusion EF EF 28% most consistent with non ischemic cardiomyopathy   Ct Angio Chest Aorta W/cm &/or Wo/cm  11/14/2015  CLINICAL DATA:  Pt having left sided chest pain radiating to back started last night with numbness in left  arm. Pt has had a cough x 2 months. Pt denies sob. EXAM: CT ANGIOGRAPHY CHEST WITH CONTRAST TECHNIQUE: Multidetector CT imaging of the chest was performed using the standard protocol during bolus administration of intravenous contrast. Multiplanar CT image reconstructions and MIPs were obtained to evaluate the vascular anatomy. CONTRAST:  72mL OMNIPAQUE IOHEXOL 350 MG/ML SOLN COMPARISON:  Chest CT, 07/11/2015 FINDINGS: Angiographic study: On the unenhanced images there is mild atherosclerotic calcifications along the aortic arch and descending thoracic aorta. The aorta is normal in caliber. There are mild coronary artery calcifications. The postcontrast images, there is no evidence of an aortic dissection. Aorta is widely patent. There is a standard branching pattern of for the aortic arch vessels. Mild partly calcified plaque is noted at the origin of the left subclavian artery without stenosis. More significant atherosclerotic plaque is noted in the upper abdominal aorta and at the origins of the celiac axis and superior mesenteric artery. No evidence of a significant stenosis. Neck pain and axilla: No mass or adenopathy. Thyroid is unremarkable.  Mediastinum and hila: A heart normal in size and configuration. Great vessels are normal in caliber. New no radiates down or hilar masses or pathologically enlarged lymph nodes. Lungs and pleura: There are several small pulmonary nodules. There is a 4 mm nodule in the right upper lobe near the apex, image 20, series 6. There is a 3 mm nodule left upper lobe, image 41. There is a 4 mm nodule in the left lower lobe, image 69. A focal area of opacity lies in the inferior right upper lobe adjacent to the minor fissure. A somewhat ill-defined solid component of this, discoid in shape based on the sagittal coronal reconstructed images, measures 14 mm in greatest dimension. This is stable from the prior study. There are few other very small nodules. There are areas of mild  peripheral interstitial thickening. There is no evidence of pneumonia or edema. No pleural effusion or pneumothorax. Limited upper abdomen:  Unremarkable. Musculoskeletal:  Unremarkable. Review of the MIP images confirms the above findings. IMPRESSION: 1. No evidence of aortic dissection or aortic aneurysm. 2. No acute findings. 3. Scattered small lung nodules similar to the prior study. Persistent opacity within the inferior right upper lobe adjacent to the minor fissure. No evidence of pneumonia or pulmonary edema. Electronically Signed   By: Lajean Manes M.D.   On: 11/14/2015 17:28   Carotid Dopplers Findings consistent with 60 - 79 percent stenosis involving the  left internal carotid artery. Ratio=3.84 - Evidence 1-39% stenosis in the right Internal carotid  artery.Ratio=0.91  Left ventricle: The cavity size was mildly dilated. Wall thickness was normal. Systolic function was moderately to severely reduced. The estimated ejection fraction was in the range of 30% to 35%. Diffuse hypokinesis. Doppler parameters are consistent with abnormal left ventricular relaxation (grade 1 diastolic dysfunction). - Mitral valve: Calcified annulus. There was mild regurgitation. Valve area by pressure half-time: 2.21 cm^2.  Impressions:  - Limited study; full doppler not performed; moderate to severe LV dysfunction; grade 1 diastolic dysfunction; mitral valve with mild rheumatic appearance but no MS by pressure half time; mild MR; trace TR.  Microbiology: No results found for this or any previous visit (from the past 240 hour(s)).   Labs: Basic Metabolic Panel:  Recent Labs Lab 11/14/15 1558 11/14/15 1603 11/15/15 0407  NA 136 136 138  K 4.3 4.2 4.1  CL 100* 98* 102  CO2 27  --  29  GLUCOSE 114* 110* 112*  BUN 16 19 13   CREATININE 1.32* 1.30* 1.24*  CALCIUM 8.9  --  8.9   Liver Function Tests:  Recent Labs Lab 11/14/15 1558 11/15/15 0407  AST 16 13*  ALT 13*  11*  ALKPHOS 51 46  BILITOT 0.2* 0.3  PROT 6.7 6.3*  ALBUMIN 3.6 3.3*   No results for input(s): LIPASE, AMYLASE in the last 168 hours. No results for input(s): AMMONIA in the last 168 hours. CBC:  Recent Labs Lab 11/14/15 1558 11/14/15 1603 11/15/15 0407  WBC 8.7  --  6.9  NEUTROABS 6.0  --   --   HGB 11.2* 12.2 11.0*  HCT 35.2* 36.0 35.0*  MCV 87.1  --  87.3  PLT 229  --  219   Cardiac Enzymes:  Recent Labs Lab 11/14/15 2231 11/15/15 0407 11/15/15 1513  TROPONINI <0.03 <0.03 <0.03   BNP: BNP (last 3 results) No results for input(s): BNP in the last 8760 hours.  ProBNP (last 3 results) No results for input(s): PROBNP in the last 8760 hours.  CBG:  Recent Labs Lab 11/15/15 1300 11/15/15 1706 11/15/15 2218 11/16/15 0723 11/16/15 1142  GLUCAP 137* 140* 126* 138* 100*       Signed:  Delfina Redwood MD.  [facility] 11/16/2015, 3:07 PM

## 2015-11-20 ENCOUNTER — Ambulatory Visit (INDEPENDENT_AMBULATORY_CARE_PROVIDER_SITE_OTHER): Payer: Medicare Other | Admitting: *Deleted

## 2015-11-20 DIAGNOSIS — E538 Deficiency of other specified B group vitamins: Secondary | ICD-10-CM

## 2015-11-20 NOTE — Patient Instructions (Signed)
See next month. 

## 2015-11-20 NOTE — Progress Notes (Signed)
Pt here for B 12 injection.  Under aseptic technique cyanocobalamin 1000mcg/1ml IM given L deltoid.  Tolerated well.  Bandaid applied.  

## 2015-12-12 ENCOUNTER — Other Ambulatory Visit: Payer: Self-pay | Admitting: Internal Medicine

## 2015-12-18 ENCOUNTER — Ambulatory Visit (INDEPENDENT_AMBULATORY_CARE_PROVIDER_SITE_OTHER): Payer: Self-pay

## 2015-12-18 ENCOUNTER — Ambulatory Visit: Payer: Medicare Other | Admitting: Adult Health

## 2015-12-18 ENCOUNTER — Ambulatory Visit (INDEPENDENT_AMBULATORY_CARE_PROVIDER_SITE_OTHER): Payer: Medicare Other | Admitting: Adult Health

## 2015-12-18 ENCOUNTER — Encounter: Payer: Self-pay | Admitting: Adult Health

## 2015-12-18 ENCOUNTER — Other Ambulatory Visit: Payer: Self-pay | Admitting: *Deleted

## 2015-12-18 VITALS — BP 141/74 | HR 92 | Ht 65.0 in | Wt 160.0 lb

## 2015-12-18 DIAGNOSIS — E1142 Type 2 diabetes mellitus with diabetic polyneuropathy: Secondary | ICD-10-CM

## 2015-12-18 DIAGNOSIS — R202 Paresthesia of skin: Secondary | ICD-10-CM

## 2015-12-18 DIAGNOSIS — E538 Deficiency of other specified B group vitamins: Secondary | ICD-10-CM | POA: Diagnosis not present

## 2015-12-18 DIAGNOSIS — Z0289 Encounter for other administrative examinations: Secondary | ICD-10-CM

## 2015-12-18 MED ORDER — PRAMIPEXOLE DIHYDROCHLORIDE 0.5 MG PO TABS: ORAL_TABLET | ORAL | Status: DC

## 2015-12-18 NOTE — Progress Notes (Signed)
PATIENT: Melissa [name] DOB: 07-02-1928  REASON FOR VISIT: follow up- paresthesia, vitamin B-12 deficiency HISTORY FROM: patient  HISTORY OF PRESENT ILLNESS: Melissa [name] is an 82 year old female with a history of diabetes with diabetic peripheral neuropathy, vitamin B12 deficiency and paresthesias. She returns today for an evaluation. At the last visit she was started on Mirapex. She reports that this was beneficial. She states that initially resolved all of her symptoms throughout the night. She states more recently the symptoms have started to come back. She is questioning if we can increase the Mirapex. She states that she has tolerated this medication well with no side effects. She denies any new neurological symptoms. Her vitamin B12 level was also decreased and was started on B12 injections. She received an injection today during the office visit. She returns today for an evaluation  HISTORY 08/22/15 [facility]): Melissa [name] is an 82 year old right-handed white female with a history of diabetes with a diabetic peripheral neuropathy. The patient has had a chronic issue with anemia, she is on iron supplementation, she has borderline low ferritin levels. The patient indicates that over the last 2 years she has had a crawling sensation that goes up from the low back into the neck and shoulder area that occurs every night. She does not note any discomfort when she is lying flat on her back, but when she rolls to the side, she will have a sensation as if a rodent is crawling up her back. She has purchased another mattress, and she has individually stopped each one of her medications for a period of time to see if this improves the sensations, but without benefit. She does not note any problems during the daytime. She does have some urinary incontinence, but no difficulty with controlling the bowels. In the morning time, she will have some crepitus in the neck and some discomfort. This goes away by midday.  She is on Lyrica for her peripheral neuropathy, but the Lyrica does nothing for these nocturnal sensations. She is not sleeping well. She denies any significant balance issues. She has no weakness of the extremities. There is no history of definite restless legs symptoms, but the back sensation does result in some restlessness.   REVIEW OF SYSTEMS: Out of a complete 14 system review of symptoms, the patient complains only of the following symptoms, and all other reviewed systems are negative.  Memory loss, dizziness, headache, weakness, tremors, back pain, aching muscles, neck pain, neck stiffness, restless leg  ALLERGIES: No Known Allergies  HOME MEDICATIONS: Outpatient Prescriptions Prior to Visit  Medication Sig Dispense Refill  . acetaminophen (TYLENOL) 500 MG tablet Take 1,000 mg by mouth every 6 (six) hours as needed for mild pain. Reported on 11/02/2015    . albuterol (PROVENTIL) (2.5 MG/3ML) 0.083% nebulizer solution Take 2.5 mg by nebulization every 6 (six) hours as needed for wheezing or shortness of breath.     Marland Kitchen apixaban (ELIQUIS) 2.5 MG TABS tablet Take 1 tablet (2.5 mg total) by mouth 2 (two) times daily. 180 tablet 3  . atorvastatin (LIPITOR) 40 MG tablet Take 1 tablet (40 mg total) by mouth daily at 6 PM. 30 tablet 0  . Cholecalciferol (VITAMIN D3) 1000 UNITS CAPS Take 1 capsule by mouth daily.    Marland Kitchen esomeprazole (NEXIUM) 40 MG capsule Take 40 mg by mouth 2 (two) times daily.      . Fe Fum-FePoly-Vit C-Vit B3 (INTEGRA) 62.5-62.5-40-3 MG CAPS Take 1 capsule by mouth daily.  3  .  FLUoxetine (PROZAC) 20 MG capsule Take 20 mg by mouth daily.     . fluticasone (FLONASE) 50 MCG/ACT nasal spray Place 2 sprays into both nostrils daily.  2  . Insulin Aspart (NOVOLOG [place]) Inject 4 Units into the skin 3 (three) times daily.     . Insulin Detemir (LEVEMIR FLEXTOUCH) 100 UNIT/ML Pen Inject 34 units at bedtime (Patient taking differently: Inject 26 Units into the skin daily at 10 pm. Inject  34 units at bedtime) 30 mL 1  . ipratropium (ATROVENT) 0.06 % nasal spray Place 2 sprays into both nostrils 4 (four) times daily. (Patient taking differently: Place 2 sprays into both nostrils 4 (four) times daily as needed for rhinitis. ) 15 mL 1  . levalbuterol (XOPENEX) 0.63 MG/3ML nebulizer solution Take 0.63 mg by nebulization every 6 (six) hours as needed for wheezing or shortness of breath.   0  . levothyroxine (SYNTHROID, LEVOTHROID) 50 MCG tablet TAKE 1 TABLET BY MOUTH EVERY DAY 30 tablet 2  . Liraglutide (VICTOZA) 18 MG/3ML SOPN Inject 0.2 mLs (1.2 mg total) into the skin daily. (Patient taking differently: Inject 1.2 mg into the skin at bedtime. ) 6 pen 0  . losartan (COZAAR) 25 MG tablet Take 1 tablet (25 mg total) by mouth daily. 30 tablet 3  . metoprolol succinate (TOPROL-XL) 50 MG 24 hr tablet Take 1 tablet (50 mg total) by mouth daily. 90 tablet 3  . ondansetron (ZOFRAN) 4 MG tablet Take 1 tablet (4 mg total) by mouth every 8 (eight) hours as needed for nausea or vomiting. 20 tablet 0  . pramipexole (MIRAPEX) 0.25 MG tablet TAKE 1 TABLET (0.25 MG TOTAL) BY MOUTH AT BEDTIME. 90 tablet 1  . pregabalin (LYRICA) 50 MG capsule Take up to 3 capsules daily as needed (Patient taking differently: Take 50 mg by mouth 2 (two) times daily. ) 270 capsule 1  . traZODone (DESYREL) 50 MG tablet Take 50 mg by mouth at bedtime.   0  . amoxicillin-clavulanate (AUGMENTIN) 875-125 MG tablet Take 1 tablet by mouth every 12 (twelve) hours. 8 tablet 0   Facility-Administered Medications Prior to Visit  Medication Dose Route Frequency Provider Last Rate Last Dose  . cyanocobalamin ((VITAMIN B-12)) injection 1,000 mcg  1,000 mcg Intramuscular Q30 days Kathrynn Ducking, MD   1,000 mcg at 11/20/15 1538    PAST MEDICAL HISTORY: Past Medical History  Diagnosis Date  . Asthma   . Sleep apnea   . Angina   . Hypertension   . Blood transfusion   . GERD (gastroesophageal reflux disease)   . Headache(784.0)     . Arthritis   . Anxiety   . Pneumonia   . Diabetes mellitus   . Anemia   . Hiatal hernia   . Acute kidney injury ([place]) 09/26/2011  . High cholesterol   . Unspecified hypothyroidism 06/15/2013  . Cardiomyopathy ([place]) 09/02/2013    EF 45%, 12/12  . Paresthesia 08/22/2015  . Diabetic peripheral neuropathy associated with type 2 diabetes mellitus ([place]) 08/22/2015  . Diabetic peripheral neuropathy associated with type 2 diabetes mellitus ([place]) 08/22/2015  . Tremor, essential 08/22/2015    PAST SURGICAL HISTORY: Past Surgical History  Procedure Laterality Date  . Abdominal hysterectomy    . Appendectomy    . Eye surgery    . Tonsillectomy    . Breast surgery      FAMILY HISTORY: Family History  Problem Relation Age of Onset  . Stroke Mother   . Heart attack  Mother   . Heart disease Father   . Cancer Father     SOCIAL HISTORY: Social History   Social History  . Marital Status: Widowed    Spouse Name: N/A  . Number of Children: 3  . Years of Education: 11   Occupational History  . Retired    Social History Main Topics  . Smoking status: Former Smoker -- 1.00 packs/day for 20 years    Quit date: 09/21/1961  . Smokeless tobacco: Never Used  . Alcohol Use: No  . Drug Use: No  . Sexual Activity: Yes    Birth Control/ Protection: Post-menopausal   Other Topics Concern  . Not on file   Social History Narrative   Pt lives at home with her daughter and son in law.   Caffeine Use: very little      PHYSICAL EXAM  Filed Vitals:   12/18/15 1522  BP: 141/74  Pulse: 92  Height: 5\' 5"  (1.651 m)  Weight: 160 lb (72.576 kg)   Body mass index is 26.63 kg/(m^2).  Generalized: Well developed, in no acute distress   Neurological examination  Mentation: Alert oriented to time, place, history taking. Follows all commands speech and language fluent Cranial nerve II-XII: Pupils were equal round reactive to light. Extraocular movements were full, visual field were full on  confrontational test. Facial sensation and strength were normal. Uvula tongue midline. Head turning and shoulder shrug  were normal and symmetric. Motor: The motor testing reveals 5 over 5 strength of all 4 extremities. Good symmetric motor tone is noted throughout.  Sensory: Sensory testing is intact to soft touch on all 4 extremities. No evidence of extinction is noted.  Coordination: Cerebellar testing reveals good finger-nose-finger and heel-to-shin bilaterally.  Gait and station: Gait is normal. Tandem gait is normal. Romberg is negative. No drift is seen.  Reflexes: Deep tendon reflexes are symmetric and normal bilaterally.   DIAGNOSTIC DATA (LABS, IMAGING, TESTING) - I reviewed patient records, labs, notes, testing and imaging myself where available.  Lab Results  Component Value Date   WBC 6.9 11/15/2015   HGB 11.0* 11/15/2015   HCT 35.0* 11/15/2015   MCV 87.3 11/15/2015   PLT 219 11/15/2015      Component Value Date/Time   NA 138 11/15/2015 0407   K 4.1 11/15/2015 0407   CL 102 11/15/2015 0407   CO2 29 11/15/2015 0407   GLUCOSE 112* 11/15/2015 0407   BUN 13 11/15/2015 0407   CREATININE 1.24* 11/15/2015 0407   CREATININE 1.28* 09/26/2015 1746   CALCIUM 8.9 11/15/2015 0407   PROT 6.3* 11/15/2015 0407   ALBUMIN 3.3* 11/15/2015 0407   AST 13* 11/15/2015 0407   ALT 11* 11/15/2015 0407   ALKPHOS 46 11/15/2015 0407   BILITOT 0.3 11/15/2015 0407   GFRNONAA 38* 11/15/2015 0407   GFRAA 45* 11/15/2015 0407   Lab Results  Component Value Date   CHOL 138 11/15/2015   HDL 34* 11/15/2015   LDLCALC 62 11/15/2015   LDLDIRECT 100.8 09/13/2014   TRIG 210* 11/15/2015   CHOLHDL 4.1 11/15/2015   Lab Results  Component Value Date   HGBA1C 6.0* 11/15/2015   Lab Results  Component Value Date   VITAMINB12 183* 08/22/2015       ASSESSMENT AND PLAN 82 y.o. year old female  has a past medical history of Asthma; Sleep apnea; Angina; Hypertension; Blood transfusion; GERD  (gastroesophageal reflux disease); Headache(784.0); Arthritis; Anxiety; Pneumonia; Diabetes mellitus; Anemia; Hiatal hernia; Acute kidney injury ([place]) (09/26/2011);  High cholesterol; Unspecified hypothyroidism (06/15/2013); Cardiomyopathy ([place]) (09/02/2013); Paresthesia (08/22/2015); Diabetic peripheral neuropathy associated with type 2 diabetes mellitus ([place]) (08/22/2015); Diabetic peripheral neuropathy associated with type 2 diabetes mellitus ([place]) (08/22/2015); and Tremor, essential (08/22/2015). here with:  1. Paresthesia 2. Vitamin B12 deficiency 3. Diabetic neuropathy   Overall the patient is doing well. Her paresthesias did have a good response with Mirapex. However more recently her symptoms have returned. We will increase Mirapex to 0.5 mg daily at bedtime. In the future we may have to consider extended release if the medication wears off during the night. Patient verbalized understanding. She will follow-up in 4 months or sooner if needed.    Ward Givens, MSN, NP-C 12/18/2015, 3:36 PM [facility] [address], [place], [place] [address] [phone]

## 2015-12-18 NOTE — Progress Notes (Signed)
I have read the note, and I agree with the clinical assessment and plan.  Marieelena Bartko KEITH   

## 2015-12-18 NOTE — Progress Notes (Signed)
Vitamin B12 injection given in left deltoid after site cleansed with alcohol. Bandaid applied. Patient tolerated well.

## 2015-12-18 NOTE — Patient Instructions (Signed)
Increase Mirapex to 0.5 mg at bedtime  If your symptoms worsen or you develop new symptoms please let us know.

## 2015-12-20 ENCOUNTER — Telehealth: Payer: Self-pay

## 2015-12-20 NOTE — Telephone Encounter (Signed)
Pharmacy needed clarification of orders, new Rx sent in.

## 2015-12-21 ENCOUNTER — Encounter: Payer: Self-pay | Admitting: Cardiology

## 2015-12-21 ENCOUNTER — Ambulatory Visit (INDEPENDENT_AMBULATORY_CARE_PROVIDER_SITE_OTHER): Payer: Medicare Other | Admitting: Cardiology

## 2015-12-21 VITALS — BP 134/70 | HR 90 | Ht 65.0 in | Wt 160.8 lb

## 2015-12-21 DIAGNOSIS — Z7901 Long term (current) use of anticoagulants: Secondary | ICD-10-CM | POA: Diagnosis not present

## 2015-12-21 DIAGNOSIS — I480 Paroxysmal atrial fibrillation: Secondary | ICD-10-CM | POA: Diagnosis not present

## 2015-12-21 DIAGNOSIS — G459 Transient cerebral ischemic attack, unspecified: Secondary | ICD-10-CM | POA: Diagnosis not present

## 2015-12-21 DIAGNOSIS — E785 Hyperlipidemia, unspecified: Secondary | ICD-10-CM | POA: Diagnosis not present

## 2015-12-21 DIAGNOSIS — I429 Cardiomyopathy, unspecified: Secondary | ICD-10-CM

## 2015-12-21 NOTE — Progress Notes (Signed)
[facility] [address] [place], [place]  [address] Phone: [phone] Fax:  [phone]  Date:  12/21/2015   ID:  Melissa [name], DOB Dec 09, 1927, MRN [id number]  PCP:  [name], MD   History of Present Illness: Melissa [name] is a 82 y.o. female with chronic systolic heart failure, decreased ejection fraction of 35% to 24, previously had returned to normal, here for follow-up atrial fibrillation, follow-up recent hospitalization 11/16/15 with TIA. MRI showed no stroke. Nuclear stress test was done during the hospitalization that was no ischemia. Ejection fraction reduced. No further cardiac testing warranted... She has diabetes, hypertension, hyperlipidemia. Prior hospitalization in mid December 2012 secondary to altered mental status and increasing O2 demands. Her BNP was noted to be elevated at 3031.   During a prior encounter she described-Stood at desk at hair appt, could not speak felt like she was loosing vision which occurred after she stood up from the chair.  Felt like could not speak. When EMS came, her blood pressure was low. Could not palpate. She was dehydrated. BP was orthostatic. CT scan and ECG OK. Felt fine since since then. Been off maxide since 2015.   Echocardiogram 2012 showed an EF of 45-50% mildly reduced. Chest x-ray showed no acute disease.   Stress test was performed on 11/08/11 and was low risk, no ischemia, normal EF actually.   Had event monitor 01/17/2015 which showed brief episode, 34 minutes of atrial fibrillation, fastest heart rate was 166 bpm at the time.  Echocardiogram 06/2015 showed reduction in ejection fraction to 30-35%. Plaque buildup in the aorta.  Interestingly, she is feeling well, she is not having any heart failure-like symptoms. No orthopnea, no PND. NYHA class I.  Her brother had Brugada syndrome diagnosed by Dr. Lovena Le  No further symptoms. No shortness of breath, no syncope.   Wt Readings from Last 3 Encounters:  12/21/15  160 lb 12.8 oz (72.938 kg)  12/18/15 160 lb (72.576 kg)  11/16/15 154 lb 1.6 oz (69.9 kg)     Past Medical History  Diagnosis Date  . Asthma   . Sleep apnea   . Angina   . Hypertension   . Blood transfusion   . GERD (gastroesophageal reflux disease)   . Headache(784.0)   . Arthritis   . Anxiety   . Pneumonia   . Diabetes mellitus   . Anemia   . Hiatal hernia   . Acute kidney injury ([place]) 09/26/2011  . High cholesterol   . Unspecified hypothyroidism 06/15/2013  . Cardiomyopathy ([place]) 09/02/2013    EF 45%, 12/12  . Paresthesia 08/22/2015  . Diabetic peripheral neuropathy associated with type 2 diabetes mellitus ([place]) 08/22/2015  . Diabetic peripheral neuropathy associated with type 2 diabetes mellitus ([place]) 08/22/2015  . Tremor, essential 08/22/2015    Past Surgical History  Procedure Laterality Date  . Abdominal hysterectomy    . Appendectomy    . Eye surgery    . Tonsillectomy    . Breast surgery      Current Outpatient Prescriptions  Medication Sig Dispense Refill  . acetaminophen (TYLENOL) 500 MG tablet Take 1,000 mg by mouth every 6 (six) hours as needed for mild pain. Reported on 11/02/2015    . albuterol (PROVENTIL) (2.5 MG/3ML) 0.083% nebulizer solution Take 2.5 mg by nebulization every 6 (six) hours as needed for wheezing or shortness of breath.     Marland Kitchen apixaban (ELIQUIS) 2.5 MG TABS tablet Take 1 tablet (2.5 mg  total) by mouth 2 (two) times daily. 180 tablet 3  . atorvastatin (LIPITOR) 40 MG tablet Take 1 tablet (40 mg total) by mouth daily at 6 PM. 30 tablet 0  . Cholecalciferol (VITAMIN D3) 1000 UNITS CAPS Take 1 capsule by mouth daily.    . ciprofloxacin (CIPRO) 250 MG tablet Take 250 mg by mouth daily as needed (on going bladder infection). daily  5  . esomeprazole (NEXIUM) 40 MG capsule Take 40 mg by mouth 2 (two) times daily.      . Fe Fum-FePoly-Vit C-Vit B3 (INTEGRA) 62.5-62.5-40-3 MG CAPS Take 1 capsule by mouth daily.  3  . FLUoxetine (PROZAC) 20 MG capsule  Take 20 mg by mouth daily.     . fluticasone (FLONASE) 50 MCG/ACT nasal spray Place 2 sprays into both nostrils daily.  2  . Insulin Aspart (NOVOLOG ) Inject 4 Units into the skin 3 (three) times daily.     . insulin detemir (LEVEMIR) 100 UNIT/ML injection Inject 26 Units into the skin at bedtime.    Marland Kitchen ipratropium (ATROVENT) 0.06 % nasal spray Place 2 sprays into both nostrils 4 (four) times daily as needed for rhinitis.    Marland Kitchen levalbuterol (XOPENEX) 0.63 MG/3ML nebulizer solution Take 0.63 mg by nebulization every 6 (six) hours as needed for wheezing or shortness of breath.   0  . levothyroxine (SYNTHROID, LEVOTHROID) 50 MCG tablet TAKE 1 TABLET BY MOUTH EVERY DAY 30 tablet 2  . Liraglutide (VICTOZA) 18 MG/3ML SOPN Inject into the skin. 0.31ml injection in to skin daily    . losartan (COZAAR) 25 MG tablet Take 1 tablet (25 mg total) by mouth daily. 30 tablet 3  . metoprolol succinate (TOPROL-XL) 50 MG 24 hr tablet Take 1 tablet (50 mg total) by mouth daily. 90 tablet 3  . ondansetron (ZOFRAN) 4 MG tablet Take 1 tablet (4 mg total) by mouth every 8 (eight) hours as needed for nausea or vomiting. 20 tablet 0  . pramipexole (MIRAPEX) 0.5 MG tablet TAKE 1 TABLET (0.25 MG TOTAL) BY MOUTH AT BEDTIME. 90 tablet 3  . pregabalin (LYRICA) 50 MG capsule Take 50 mg by mouth 3 (three) times daily as needed (For pain(diabetes)).    Marland Kitchen traZODone (DESYREL) 50 MG tablet Take 50 mg by mouth at bedtime.   0   Current Facility-Administered Medications  Medication Dose Route Frequency Provider Last Rate Last Dose  . cyanocobalamin ((VITAMIN B-12)) injection 1,000 mcg  1,000 mcg Intramuscular Q30 days Kathrynn Ducking, MD   1,000 mcg at 12/18/15 1540    Allergies:   No Known Allergies  Social History:  The patient  reports that she quit smoking about 54 years ago. She has never used smokeless tobacco. She reports that she does not drink alcohol or use illicit drugs.   ROS:  Please see the history of present  illness.   Denies any syncope, bleeding, orthopnea, PND, shortness of breath  PHYSICAL EXAM: VS:  BP 134/70 mmHg  Pulse 90  Ht 5\' 5"  (1.651 m)  Wt 160 lb 12.8 oz (72.938 kg)  BMI 26.76 kg/m2 Well nourished, well developed, in no acute distress HEENT: normal Neck: no JVD Cardiac:  normal S1, S2; RRR/normal rate; no murmur Lungs:  clear to auscultation bilaterally, no wheezing, rhonchi or rales Abd: soft, nontender, no hepatomegaly Ext: no edema today Skin: warm and dry Neuro: no focal abnormalities noted  EKG:  None today     ASSESSMENT AND PLAN:  1. Atrial fibrillation, paroxysmal-34 minutes  noted on event monitor through April 2016. Heart rate consisted of rapid ventricular response. She was started on anticoagulation, apixaban 2.5 mg twice a day at that time. Creatinine at one point was 1.8 and she is almost 82 years old. We discussed bleeding risks.  increasing Toprol-XL to 50 mg. EF 35%. 2. TIA-left sided facial numbness weakness some chest pain as well. Nuclear stress test reassuring, no ischemia, reduced ejection fraction, MRI without stroke. 3. Near syncope-no further episodes. Previously sounds mostly orthostatic in origin. She still.and started to lose her vision, could not speak. She has had an extensive neurologic evaluation 2 years ago, unremarkable. I wonder if that time she was having a TIA. 4. Cardiomyopathy- reduced LV systolic dysfunction has returned. No diuretic therapy currently because of relative hypotension. She is well compensated. 5. Hypertension-currently very well controlled. In fact, mildly low.  6. Hyperlipidemia-continue with low-dose statin. LDL 89 on 12/15/13 7. Chronic kidney disease stage III-creatinine 1.3 at last check. Avoid NSAIDs See her back in 6 months Signed, [name], MD [facility]  12/21/2015 2:52 PM

## 2015-12-21 NOTE — Patient Instructions (Signed)

## 2015-12-25 ENCOUNTER — Other Ambulatory Visit: Payer: Self-pay | Admitting: Surgery

## 2015-12-25 DIAGNOSIS — K8020 Calculus of gallbladder without cholecystitis without obstruction: Secondary | ICD-10-CM | POA: Diagnosis not present

## 2015-12-27 ENCOUNTER — Other Ambulatory Visit: Payer: Self-pay | Admitting: Endocrinology

## 2016-01-15 ENCOUNTER — Other Ambulatory Visit: Payer: Self-pay | Admitting: Endocrinology

## 2016-01-15 NOTE — Pre-Procedure Instructions (Signed)
Melissa [name]  01/15/2016      [id number] [place] [place] [address] [place] [address] Phone: [phone] Fax: [phone]  [facility] [place] [place] [address] Phone: [phone] Fax: [phone]    Your procedure is scheduled on Wednesday, April 12th   Report to [facility] Admitting at 10:30 am             ( Posted surgery time 12:23 pm - 1:23 pm)   Call this number if you have problems the morning of surgery:  [phone]   Remember:  Do not eat food or drink liquids after midnight Tuesday.  Take these medicines the morning of surgery with A SIP OF WATER : Allegra (fexofenadine), Prozac, Levothyroxine (synthroid, levothroid) , Metoprolol (lopressor), Nexium, pregabalin (lyrica).  Please use Flonase and Nebulizer treatment that morning.  STOP: ALL Vitamins, Supplements, Effient and Herbal Medications, Fish Oils, Aspirins, NSAIDs (Nonsteroidal Anti-inflammatories such as Ibuprofen, Aleve, or Advil), and Goody's/BC Powders 7 days prior to surgery, until after surgery as directed by your physician.   Stop Eliquis 3 days prior to surgery as directed by [name].  Resume when directed by the physician.   Do not wear jewelry, make-up or nail polish.  Do not wear lotions, powders, or perfumes.    Do not shave 48 hours prior to surgery.    Do not bring valuables to the hospital.  [facility] is not responsible for any belongings or valuables.  Contacts, dentures or bridgework may not be worn into surgery.  Leave your suitcase in the car.  After surgery it may be brought to your room. For patients admitted to the hospital, discharge time will be determined by your treatment team.  Name and phone number of your driver:     Please read over the following fact sheets that you  were given. Pain Booklet, Coughing and Deep Breathing and Surgical Site Infection Prevention      How to Manage Your Diabetes Before and After Surgery  Why is it important to control my blood sugar before and after surgery? . Improving blood sugar levels before and after surgery helps healing and can limit problems. . A way of improving blood sugar control is eating a healthy diet by: o  Eating less sugar and carbohydrates o  Increasing activity/exercise o  Talking with your doctor about reaching your blood sugar goals . High blood sugars (greater than 180 mg/dL) can raise your risk of infections and slow your recovery, so you will need to focus on controlling your diabetes during the weeks before surgery. . Make sure that the doctor who takes care of your diabetes knows about your planned surgery including the date and location.  How do I manage my blood sugar before surgery? . Check your blood sugar at least 4 times a day, starting 2 days before surgery, to make sure that the level is not too high or low. o Check your blood sugar the morning of your surgery when you wake up and every 2 hours until you get to the Short Stay unit. o  . If your blood sugar is less than 70 mg/dL, you will need to treat for low blood sugar: o Do not take insulin. o Treat a low blood sugar (less than 70 mg/dL) with  cup of clear juice (  cranberry or apple), 4 glucose tablets, OR glucose gel. o  o Recheck blood sugar in 15 minutes after treatment (to make sure it is greater than 70 mg/dL). If your blood sugar is not greater than 70 mg/dL on recheck, call [phone] for further instructions. . Report your blood sugar to the short stay nurse when you get to Short Stay.  . If you are admitted to the hospital after surgery: o Your blood sugar will be checked by the staff and you will probably be given insulin after surgery (instead of oral diabetes medicines) to make sure you have good blood sugar  levels. o The goal for blood sugar control after surgery is 80-180 mg/dL.      WHAT DO I DO ABOUT MY DIABETES MEDICATION?   Marland Kitchen Do not take oral diabetes medicines (pills) the morning of surgery.  . THE NIGHT BEFORE SURGERY, take 20 units of  Levemir_insulin. Take Victoza as you normally would.      Marland Kitchen HE MORNING OF SURGERY take 2 units of Novolog if your blood sugar is greater than 220 mg/dl.  . The day of surgery, do not take other diabetes injectables, including Byetta (exenatide), Bydureon (exenatide ER), Victoza (liraglutide), or Trulicity (dulaglutide).

## 2016-01-16 ENCOUNTER — Encounter (HOSPITAL_COMMUNITY): Payer: Self-pay

## 2016-01-16 ENCOUNTER — Encounter (HOSPITAL_COMMUNITY)
Admission: RE | Admit: 2016-01-16 | Discharge: 2016-01-16 | Disposition: A | Discharge status: Home / Self Care | Payer: Medicare Other | Source: Ambulatory Visit | Attending: Surgery | Admitting: Surgery

## 2016-01-16 DIAGNOSIS — Z01812 Encounter for preprocedural laboratory examination: Secondary | ICD-10-CM | POA: Insufficient documentation

## 2016-01-16 HISTORY — DX: Cardiac arrhythmia, unspecified: I49.9

## 2016-01-16 HISTORY — DX: Unspecified intracranial injury with loss of consciousness of unspecified duration, initial encounter: S06.9X9A

## 2016-01-16 HISTORY — DX: Cardiac murmur, unspecified: R01.1

## 2016-01-16 LAB — BASIC METABOLIC PANEL
Anion gap: 9 (ref 5–15)
BUN: 13 mg/dL (ref 6–20)
CHLORIDE: 100 mmol/L — AB (ref 101–111)
CO2: 29 mmol/L (ref 22–32)
CREATININE: 1.2 mg/dL — AB (ref 0.44–1.00)
Calcium: 9.1 mg/dL (ref 8.9–10.3)
GFR calc Af Amer: 46 mL/min — ABNORMAL LOW (ref >60)
GFR, EST NON AFRICAN AMERICAN: 40 mL/min — AB (ref >60)
GLUCOSE: 115 mg/dL — AB (ref 65–99)
POTASSIUM: 3.8 mmol/L (ref 3.5–5.1)
Sodium: 138 mmol/L (ref 135–145)

## 2016-01-16 LAB — CBC
HCT: 37.2 % (ref 36.0–46.0)
Hemoglobin: 11.8 g/dL — ABNORMAL LOW (ref 12.0–15.0)
MCH: 27.8 pg (ref 26.0–34.0)
MCHC: 31.7 g/dL (ref 30.0–36.0)
MCV: 87.5 fL (ref 78.0–100.0)
PLATELETS: 221 10*3/uL (ref 150–400)
RBC: 4.25 MIL/uL (ref 3.87–5.11)
RDW: 13.7 % (ref 11.5–15.5)
WBC: 7.7 10*3/uL (ref 4.0–10.5)

## 2016-01-16 LAB — GLUCOSE, CAPILLARY: GLUCOSE-CAPILLARY: 133 mg/dL — AB (ref 65–99)

## 2016-01-16 NOTE — Progress Notes (Signed)
Cardiologist is [name].  Recent cardiac testing (stress, Echo) was performed during recent hospitalization here at [facility] for syncopal/TIA type episode in February 2017.  Patient denies catheterization.  Clearance letter located under Media tab in Chart Review 12/28/15.  PCP is currently [name] but patient hopes to start seeing Silvestre Mesi soon.    Patient sees Dr. Jannifer [name] for neurology.  Dr Dwyane Dee for endocrinology.    Patient lives at home with daughter [name]. Granddaughter [name] manages her medications and appears knowledgeable regarding the patient's different doctors/health history but was not present at PAT appt.

## 2016-01-17 LAB — HEMOGLOBIN A1C
HEMOGLOBIN A1C: 5.5 % (ref 4.8–5.6)
Mean Plasma Glucose: 111 mg/dL

## 2016-01-17 NOTE — Progress Notes (Signed)
Anesthesia Chart Review:  Pt is an 82 year old female scheduled for laparoscopic cholecystectomy 01/24/2016 with Dr. Evlyn Courier.   Cardiologist is Dr. [name] who has cleared pt at moderate to high risk due to EF 35%. PCP is Dr. [name].   PMH includes:  Atrial fibrillation, CHF, angina, cardiomyopathy, HTN, DM, hyperlipidemia, asthma, OSA, hypothyroidism, heart murmur, anemia, GERD. Former smoker. BMI 26  Medications include: albuterol, eliquis, lipitor, nexium, iron, novolog, levalbuterol, levemir, levothyroxine, losartan, metoprolol, victoza. Pt to hold eliquis 3 days prior to surgery.   Preoperative labs reviewed.  HgbA1c 5.5, glucose 115. PT will be obtained DOS.   Chest x-ray 08/27/15 reviewed.  1. No acute findings. The lungs appear clear. 2. Atherosclerotic calcification of the aortic arch.  EKG 11/14/15: Sinus rhythm. Baseline wander in lead(s) II III aVF  Echo 11/16/15:  - Left ventricle: The cavity size was mildly dilated. Wall thickness was normal. Systolic function was moderately to severely reduced. The estimated ejection fraction was in the range of 30% to 35%. Diffuse hypokinesis. Doppler parameters are consistent with abnormal left ventricular relaxation (grade 1 diastolic dysfunction). - Mitral valve: Calcified annulus. There was mild regurgitation. Valve area by pressure half-time: 2.21 cm^2. - Impressions: Limited study; full doppler not performed; moderate to severe LV dysfunction; grade 1 diastolic dysfunction; mitral valve with mild rheumatic appearance but no MS by pressure half time; mild MR; trace TR.  Carotid duplex 11/15/15:  - Findings consistent with 60 - 79 percent stenosis involving the L ICA - Evidence 1-39% stenosis in the R ICA  Nuclear stress test 11/15/15: Normal resting and stress perfusion EF EF 28% most consistent with non ischemic cardiomyopathy  If no changes, I anticipate pt can proceed with surgery as scheduled.   Willeen Cass,  FNP-BC [facility]/Anesthesiology Phone: [phone] 01/17/2016 11:17 AM

## 2016-01-18 ENCOUNTER — Ambulatory Visit (INDEPENDENT_AMBULATORY_CARE_PROVIDER_SITE_OTHER): Payer: Medicare Other | Admitting: *Deleted

## 2016-01-18 DIAGNOSIS — E538 Deficiency of other specified B group vitamins: Secondary | ICD-10-CM | POA: Diagnosis not present

## 2016-01-18 NOTE — Progress Notes (Signed)
Pt here for B 12 injection.  Under aseptic technique cyanocobalamin 1000mcg/1ml IM given R deltoid.  Tolerated well.  Bandaid applied.  

## 2016-01-18 NOTE — Patient Instructions (Signed)
See next month. 

## 2016-01-19 ENCOUNTER — Other Ambulatory Visit (INDEPENDENT_AMBULATORY_CARE_PROVIDER_SITE_OTHER): Payer: Medicare Other

## 2016-01-19 DIAGNOSIS — E063 Autoimmune thyroiditis: Secondary | ICD-10-CM

## 2016-01-19 DIAGNOSIS — Z794 Long term (current) use of insulin: Secondary | ICD-10-CM | POA: Diagnosis not present

## 2016-01-19 DIAGNOSIS — E1165 Type 2 diabetes mellitus with hyperglycemia: Secondary | ICD-10-CM

## 2016-01-19 DIAGNOSIS — E038 Other specified hypothyroidism: Secondary | ICD-10-CM | POA: Diagnosis not present

## 2016-01-19 LAB — COMPREHENSIVE METABOLIC PANEL
ALT: 12 U/L (ref 0–35)
AST: 15 U/L (ref 0–37)
Albumin: 3.8 g/dL (ref 3.5–5.2)
Alkaline Phosphatase: 44 U/L (ref 39–117)
BUN: 15 mg/dL (ref 6–23)
CHLORIDE: 100 meq/L (ref 96–112)
CO2: 31 meq/L (ref 19–32)
Calcium: 9.2 mg/dL (ref 8.4–10.5)
Creatinine, Ser: 1.23 mg/dL — ABNORMAL HIGH (ref 0.40–1.20)
GFR: 44.02 mL/min — ABNORMAL LOW (ref >60.00)
GLUCOSE: 109 mg/dL — AB (ref 70–99)
POTASSIUM: 4 meq/L (ref 3.5–5.1)
SODIUM: 137 meq/L (ref 135–145)
Total Bilirubin: 0.3 mg/dL (ref 0.2–1.2)
Total Protein: 6.7 g/dL (ref 6.0–8.3)

## 2016-01-19 LAB — TSH: TSH: 3.03 u[IU]/mL (ref 0.35–4.50)

## 2016-01-19 LAB — HEMOGLOBIN A1C: HEMOGLOBIN A1C: 5.5 % (ref 4.6–6.5)

## 2016-01-19 LAB — MICROALBUMIN / CREATININE URINE RATIO
Creatinine,U: 88.8 mg/dL
Microalb Creat Ratio: 1.2 mg/g (ref 0.0–30.0)
Microalb, Ur: 1.1 mg/dL (ref 0.0–1.9)

## 2016-01-23 MED ORDER — CEFAZOLIN SODIUM-DEXTROSE 2-4 GM/100ML-% IV SOLN
2.0000 g | INTRAVENOUS | Status: AC
  Administered 2016-01-24: 2 g via INTRAVENOUS
  Filled 2016-01-23: qty 100

## 2016-01-23 NOTE — H&P (Signed)
Melissa [name] 12/25/2015 11:29 AM Location: [facility] Patient #: [id number] DOB: 1927/11/29 Widowed / Language: Cleophus Molt / Race: White Female   History of Present Illness (Alexandra Posadas A. Ninfa Linden MD; 12/25/2015 11:47 AM) Patient words: gallbladder.  The patient is a 82 year old female who presents for evaluation of gall stones. This is a pleasant patient referred by Dr. Lizbeth Bark for symptomatic cholelithiasis. She's been having right upper quadrant abdominal pain with nausea for many months. She has occasional bloating as well. She does have a significant history of atrial fibrillation and just saw her cardiologist last week. She is otherwise without complaints. She reports she is moving her bowels well. She has had a significant weight loss over the past 3 months but workup is otherwise been unremarkable. Her most recent CAT scan showed no evidence of cholecystitis or of any kind of hernia or other abnormalities   Other Problems Mammie Lorenzo, LPN; QA348G 624THL AM) Arthritis Asthma Back Pain Bladder Problems Cholelithiasis Chronic Renal Failure Syndrome Congestive Heart Failure Diabetes Mellitus Gastroesophageal Reflux Disease Heart murmur Hemorrhoids High blood pressure Lump In Breast Migraine Headache Myocardial infarction Thyroid Disease  Past Surgical History Mammie Lorenzo, LPN; QA348G 624THL AM) Breast Biopsy Left. Breast Mass; Local Excision Left.  Diagnostic Studies History Mammie Lorenzo, LPN; QA348G 624THL AM) Colonoscopy 1-5 years ago Mammogram within last year Pap Smear 1-5 years ago  Allergies Mammie Lorenzo, LPN; QA348G 624THL AM) No Known Drug Allergies03/13/2017  Medication History Mammie Lorenzo, LPN; QA348G [id number] AM) Lyrica (50MG  Capsule, Oral) Active. Albuterol Sulfate ((2.5 MG/3ML)0.083% Nebulized Soln, Inhalation) Active. Atorvastatin Calcium (40MG  Tablet, Oral) Active. Ciprofloxacin HCl (250MG   Tablet, Oral) Active. Eliquis (2.5MG  Tablet, Oral) Active. Esomeprazole Magnesium (40MG  Capsule DR, Oral) Active. FLUoxetine HCl (20MG  Capsule, Oral) Active. Integra (62.5-62.5-40-3MG  Capsule, Oral) Active. Levalbuterol HCl (0.63MG /3ML Nebulized Soln, Inhalation) Active. Levemir FlexTouch (100UNIT/ML Soln Pen-inj, Subcutaneous) Active. Losartan Potassium (25MG  Tablet, Oral) Active. Metoprolol Succinate ER (50MG  Tablet ER 24HR, Oral) Active. Ondansetron (4MG  Tablet Disperse, Oral) Active. OneTouch Ultra Blue (In Vitro) Active. Pramipexole Dihydrochloride (0.25MG  Tablet, Oral) Active. Synthroid (50MCG Tablet, Oral) Active. TraZODone HCl (50MG  Tablet, Oral) Active. Victoza (18MG /3ML Soln Pen-inj, Subcutaneous) Active. Tylenol (500MG  Capsule, Oral) Active. Vitamin D3 (1000UNIT Capsule, Oral) Active. Medications Reconciled  Social History Mammie Lorenzo, LPN; QA348G 624THL AM) Caffeine use Carbonated beverages. No alcohol use No drug use Tobacco use Former smoker.  Family History Mammie Lorenzo, LPN; QA348G 624THL AM) Arthritis Father, Sister. Heart Disease Brother, Son.  Pregnancy / Birth History Mammie Lorenzo, LPN; QA348G 624THL AM) Age at menarche 46 years. Age of menopause 20-55 Gravida 3 Irregular periods    Review of Systems Claiborne Billings Dockery LPN; QA348G 624THL AM) General Not Present- Appetite Loss, Chills, Fatigue, Fever, Night Sweats, Weight Gain and Weight Loss. Cardiovascular Present- Palpitations and Rapid Heart Rate. Not Present- Chest Pain, Difficulty Breathing Lying Down, Leg Cramps, Shortness of Breath and Swelling of Extremities. Gastrointestinal Present- Constipation, Excessive gas, Indigestion, Nausea and Vomiting. Not Present- Abdominal Pain, Bloating, Bloody Stool, Change in Bowel Habits, Chronic diarrhea, Difficulty Swallowing, Gets full quickly at meals, Hemorrhoids and Rectal Pain. Neurological Present- Decreased Memory,  Headaches and Weakness. Not Present- Fainting, Numbness, Seizures, Tingling, Tremor and Trouble walking.  Vitals Claiborne Billings Dockery LPN; QA348G 624THL AM) 12/25/2015 11:29 AM Weight: 156.2 lb Height: 65in Body Surface Area: 1.78 m Body Mass Index: 25.99 kg/m  Temp.: 98.28F(Oral)  Pulse: 94 (Regular)  BP: 132/80 (Sitting, Left Arm, Standard)       Physical Exam (Tammi Boulier A. Ninfa Linden  MD; 12/25/2015 11:48 AM) General Mental Status-Alert. General Appearance-Consistent with stated age. Hydration-Well hydrated. Voice-Normal.  Head and Neck Head-normocephalic, atraumatic with no lesions or palpable masses.  Eye Eyeball - Bilateral-Extraocular movements intact. Sclera/Conjunctiva - Bilateral-No scleral icterus.  Chest and Lung Exam Chest and lung exam reveals -quiet, even and easy respiratory effort with no use of accessory muscles and on auscultation, normal breath sounds, no adventitious sounds and normal vocal resonance. Inspection Chest Wall - Normal. Back - normal.  Cardiovascular Cardiovascular examination reveals -on palpation PMI is normal in location and amplitude, no palpable S3 or S4. Normal cardiac borders., normal heart sounds, regular rate and rhythm with no murmurs, carotid auscultation reveals no bruits and normal pedal pulses bilaterally.  Abdomen Inspection Inspection of the abdomen reveals - No Hernias. Skin - Scar - no surgical scars. Palpation/Percussion Palpation and Percussion of the abdomen reveal - Soft, Non Tender, No Rebound tenderness, No Rigidity (guarding) and No hepatosplenomegaly. Auscultation Auscultation of the abdomen reveals - Bowel sounds normal.  Neurologic Neurologic evaluation reveals -alert and oriented x 3 with no impairment of recent or remote memory. Mental Status-Normal.  Musculoskeletal Normal Exam - Left-Upper Extremity Strength Normal and Lower Extremity Strength Normal. Normal Exam -  Right-Upper Extremity Strength Normal, Lower Extremity Weakness.   Assessment & Plan (Gaspare Netzel A. Ninfa Linden MD; 12/25/2015 11:48 AM)  SYMPTOMATIC CHOLELITHIASIS (K80.20)  Impression: I discussed the diagnosis with the patient and her son. Laparoscopic cholecystectomy is recommended. I gave him literature regarding the surgery and discussed the procedure in detail. I discussed the risk which includes but is not limited to bleeding, infection, bile duct injury, bile leak, injury to other structures, the need to convert to an open procedure, postoperative recovery, etc. We will have to have formal cardiac clearance before general anesthesia.

## 2016-01-24 ENCOUNTER — Ambulatory Visit (HOSPITAL_COMMUNITY): Payer: Medicare Other | Admitting: Emergency Medicine

## 2016-01-24 ENCOUNTER — Encounter (HOSPITAL_COMMUNITY): Payer: Self-pay | Admitting: Certified Registered"

## 2016-01-24 ENCOUNTER — Encounter (HOSPITAL_COMMUNITY)
Admission: RE | Disposition: A | Discharge status: Home / Self Care | Payer: Self-pay | Source: Ambulatory Visit | Attending: Surgery

## 2016-01-24 ENCOUNTER — Observation Stay (HOSPITAL_COMMUNITY)
Admission: RE | Admit: 2016-01-24 | Discharge: 2016-01-25 | Disposition: A | Discharge status: Home / Self Care | Payer: Medicare Other | Source: Ambulatory Visit | Attending: Surgery | Admitting: Surgery

## 2016-01-24 ENCOUNTER — Ambulatory Visit (HOSPITAL_COMMUNITY): Payer: Medicare Other | Admitting: Anesthesiology

## 2016-01-24 DIAGNOSIS — N189 Chronic kidney disease, unspecified: Secondary | ICD-10-CM | POA: Diagnosis not present

## 2016-01-24 DIAGNOSIS — I509 Heart failure, unspecified: Secondary | ICD-10-CM | POA: Insufficient documentation

## 2016-01-24 DIAGNOSIS — Z79899 Other long term (current) drug therapy: Secondary | ICD-10-CM | POA: Diagnosis not present

## 2016-01-24 DIAGNOSIS — I130 Hypertensive heart and chronic kidney disease with heart failure and stage 1 through stage 4 chronic kidney disease, or unspecified chronic kidney disease: Secondary | ICD-10-CM | POA: Insufficient documentation

## 2016-01-24 DIAGNOSIS — K8020 Calculus of gallbladder without cholecystitis without obstruction: Secondary | ICD-10-CM | POA: Diagnosis present

## 2016-01-24 DIAGNOSIS — Z7901 Long term (current) use of anticoagulants: Secondary | ICD-10-CM | POA: Insufficient documentation

## 2016-01-24 DIAGNOSIS — K8010 Calculus of gallbladder with chronic cholecystitis without obstruction: Secondary | ICD-10-CM | POA: Diagnosis not present

## 2016-01-24 DIAGNOSIS — Z87891 Personal history of nicotine dependence: Secondary | ICD-10-CM | POA: Insufficient documentation

## 2016-01-24 DIAGNOSIS — K219 Gastro-esophageal reflux disease without esophagitis: Secondary | ICD-10-CM | POA: Diagnosis not present

## 2016-01-24 DIAGNOSIS — D649 Anemia, unspecified: Secondary | ICD-10-CM | POA: Diagnosis not present

## 2016-01-24 HISTORY — DX: Migraine, unspecified, not intractable, without status migrainosus: G43.909

## 2016-01-24 HISTORY — DX: Gastrointestinal hemorrhage, unspecified: K92.2

## 2016-01-24 HISTORY — PX: LAPAROSCOPIC CHOLECYSTECTOMY: SUR755

## 2016-01-24 HISTORY — DX: Type 2 diabetes mellitus without complications: E11.9

## 2016-01-24 HISTORY — DX: Gastrointestinal hemorrhage, unspecified: T39.395A

## 2016-01-24 HISTORY — DX: Personal history of other medical treatment: Z92.89

## 2016-01-24 HISTORY — PX: CHOLECYSTECTOMY: SHX55

## 2016-01-24 LAB — GLUCOSE, CAPILLARY
GLUCOSE-CAPILLARY: 155 mg/dL — AB (ref 65–99)
Glucose-Capillary: 114 mg/dL — ABNORMAL HIGH (ref 65–99)
Glucose-Capillary: 149 mg/dL — ABNORMAL HIGH (ref 65–99)
Glucose-Capillary: 194 mg/dL — ABNORMAL HIGH (ref 65–99)

## 2016-01-24 LAB — PROTIME-INR
INR: 1.04 (ref 0.00–1.49)
Prothrombin Time: 13.8 seconds (ref 11.6–15.2)

## 2016-01-24 SURGERY — LAPAROSCOPIC CHOLECYSTECTOMY
Anesthesia: General | Site: Abdomen

## 2016-01-24 MED ORDER — FENTANYL CITRATE (PF) 100 MCG/2ML IJ SOLN
25.0000 ug | INTRAMUSCULAR | Status: DC | PRN
  Administered 2016-01-24 (×2): 25 ug via INTRAVENOUS

## 2016-01-24 MED ORDER — BUPIVACAINE-EPINEPHRINE 0.25% -1:200000 IJ SOLN
INTRAMUSCULAR | Status: DC | PRN
  Administered 2016-01-24: 20 mL

## 2016-01-24 MED ORDER — ONDANSETRON HCL 4 MG/2ML IJ SOLN
INTRAMUSCULAR | Status: DC | PRN
  Administered 2016-01-24: 4 mg via INTRAVENOUS

## 2016-01-24 MED ORDER — FENTANYL CITRATE (PF) 250 MCG/5ML IJ SOLN
INTRAMUSCULAR | Status: AC
  Filled 2016-01-24: qty 5

## 2016-01-24 MED ORDER — FENTANYL CITRATE (PF) 250 MCG/5ML IJ SOLN
INTRAMUSCULAR | Status: DC | PRN
  Administered 2016-01-24: 50 ug via INTRAVENOUS

## 2016-01-24 MED ORDER — TRAZODONE HCL 150 MG PO TABS
75.0000 mg | ORAL_TABLET | Freq: Every day | ORAL | Status: DC
  Administered 2016-01-24: 75 mg via ORAL
  Filled 2016-01-24: qty 1

## 2016-01-24 MED ORDER — ACETAMINOPHEN 650 MG RE SUPP: 650.0000 mg | Freq: Four times a day (QID) | RECTAL | Status: DC | PRN

## 2016-01-24 MED ORDER — BOOST / RESOURCE BREEZE PO LIQD
1.0000 | Freq: Three times a day (TID) | ORAL | Status: DC
  Administered 2016-01-24: 1 via ORAL

## 2016-01-24 MED ORDER — ROCURONIUM BROMIDE 100 MG/10ML IV SOLN
INTRAVENOUS | Status: DC | PRN
  Administered 2016-01-24: 30 mg via INTRAVENOUS

## 2016-01-24 MED ORDER — DEXAMETHASONE SODIUM PHOSPHATE 4 MG/ML IJ SOLN
INTRAMUSCULAR | Status: AC
  Filled 2016-01-24: qty 1

## 2016-01-24 MED ORDER — DEXAMETHASONE SODIUM PHOSPHATE 4 MG/ML IJ SOLN
INTRAMUSCULAR | Status: DC | PRN
  Administered 2016-01-24: 4 mg via INTRAVENOUS

## 2016-01-24 MED ORDER — FENTANYL CITRATE (PF) 100 MCG/2ML IJ SOLN
INTRAMUSCULAR | Status: AC
  Filled 2016-01-24: qty 2

## 2016-01-24 MED ORDER — POTASSIUM CHLORIDE IN NACL 20-0.9 MEQ/L-% IV SOLN
INTRAVENOUS | Status: DC
  Administered 2016-01-24: 13:00:00 via INTRAVENOUS
  Filled 2016-01-24: qty 1000

## 2016-01-24 MED ORDER — HYDROCODONE-ACETAMINOPHEN 5-325 MG PO TABS
1.0000 | ORAL_TABLET | ORAL | Status: DC | PRN
  Administered 2016-01-24: 2 via ORAL
  Filled 2016-01-24: qty 2

## 2016-01-24 MED ORDER — ACETAMINOPHEN 325 MG PO TABS: 650.0000 mg | ORAL_TABLET | Freq: Four times a day (QID) | ORAL | Status: DC | PRN

## 2016-01-24 MED ORDER — KETOROLAC TROMETHAMINE 30 MG/ML IJ SOLN
INTRAMUSCULAR | Status: AC
  Filled 2016-01-24: qty 1

## 2016-01-24 MED ORDER — LIDOCAINE HCL (CARDIAC) 20 MG/ML IV SOLN
INTRAVENOUS | Status: DC | PRN
  Administered 2016-01-24: 100 mg via INTRAVENOUS

## 2016-01-24 MED ORDER — METOPROLOL SUCCINATE ER 50 MG PO TB24
50.0000 mg | ORAL_TABLET | Freq: Every day | ORAL | Status: DC
  Administered 2016-01-25: 50 mg via ORAL
  Filled 2016-01-24: qty 1

## 2016-01-24 MED ORDER — PROPOFOL 10 MG/ML IV BOLUS
INTRAVENOUS | Status: AC
  Filled 2016-01-24: qty 20

## 2016-01-24 MED ORDER — ONDANSETRON 4 MG PO TBDP: 4.0000 mg | ORAL_TABLET | Freq: Four times a day (QID) | ORAL | Status: DC | PRN

## 2016-01-24 MED ORDER — MORPHINE SULFATE (PF) 2 MG/ML IV SOLN
1.0000 mg | INTRAVENOUS | Status: DC | PRN
  Administered 2016-01-24 (×2): 2 mg via INTRAVENOUS
  Filled 2016-01-24 (×2): qty 1

## 2016-01-24 MED ORDER — LACTATED RINGERS IV SOLN
INTRAVENOUS | Status: DC
  Administered 2016-01-24: 09:00:00 via INTRAVENOUS

## 2016-01-24 MED ORDER — SODIUM CHLORIDE 0.9 % IR SOLN
Status: DC | PRN
  Administered 2016-01-24: 1000 mL

## 2016-01-24 MED ORDER — INSULIN ASPART 100 UNIT/ML ~~LOC~~ SOLN
0.0000 [IU] | Freq: Three times a day (TID) | SUBCUTANEOUS | Status: DC
  Administered 2016-01-24: 2 [IU] via SUBCUTANEOUS
  Administered 2016-01-24: 3 [IU] via SUBCUTANEOUS

## 2016-01-24 MED ORDER — ONDANSETRON HCL 4 MG/2ML IJ SOLN: 4.0000 mg | Freq: Once | INTRAMUSCULAR | Status: DC | PRN

## 2016-01-24 MED ORDER — SUGAMMADEX SODIUM 200 MG/2ML IV SOLN
INTRAVENOUS | Status: DC | PRN
  Administered 2016-01-24: 150 mg via INTRAVENOUS

## 2016-01-24 MED ORDER — BUPIVACAINE-EPINEPHRINE (PF) 0.25% -1:200000 IJ SOLN
INTRAMUSCULAR | Status: AC
  Filled 2016-01-24: qty 30

## 2016-01-24 MED ORDER — PROPOFOL 10 MG/ML IV BOLUS
INTRAVENOUS | Status: DC | PRN
  Administered 2016-01-24: 90 mg via INTRAVENOUS

## 2016-01-24 MED ORDER — 0.9 % SODIUM CHLORIDE (POUR BTL) OPTIME
TOPICAL | Status: DC | PRN
  Administered 2016-01-24: 1000 mL

## 2016-01-24 MED ORDER — SUGAMMADEX SODIUM 200 MG/2ML IV SOLN
INTRAVENOUS | Status: AC
  Filled 2016-01-24: qty 2

## 2016-01-24 MED ORDER — LIDOCAINE HCL (CARDIAC) 20 MG/ML IV SOLN
INTRAVENOUS | Status: AC
  Filled 2016-01-24: qty 5

## 2016-01-24 MED ORDER — INSULIN ASPART 100 UNIT/ML ~~LOC~~ SOLN: 0.0000 [IU] | Freq: Every day | SUBCUTANEOUS | Status: DC

## 2016-01-24 MED ORDER — DEXAMETHASONE SODIUM PHOSPHATE 4 MG/ML IJ SOLN
INTRAMUSCULAR | Status: AC
  Filled 2016-01-24: qty 2

## 2016-01-24 MED ORDER — ONDANSETRON HCL 4 MG/2ML IJ SOLN: 4.0000 mg | Freq: Four times a day (QID) | INTRAMUSCULAR | Status: DC | PRN

## 2016-01-24 MED ORDER — LOSARTAN POTASSIUM 50 MG PO TABS
25.0000 mg | ORAL_TABLET | Freq: Every day | ORAL | Status: DC
  Administered 2016-01-25: 25 mg via ORAL
  Filled 2016-01-24 (×2): qty 1

## 2016-01-24 MED ORDER — PANTOPRAZOLE SODIUM 40 MG PO TBEC
40.0000 mg | DELAYED_RELEASE_TABLET | Freq: Every day | ORAL | Status: DC
Start: 2016-01-25 — End: 2016-01-25
  Administered 2016-01-25: 40 mg via ORAL
  Filled 2016-01-24: qty 1

## 2016-01-24 SURGICAL SUPPLY — 42 items
APPLIER CLIP 5 13 M/L LIGAMAX5 (MISCELLANEOUS) ×2
APR CLP MED LRG 5 ANG JAW (MISCELLANEOUS) ×1
BAG SPEC RTRVL LRG 6X4 10 (ENDOMECHANICALS) ×1
CANISTER SUCTION 2500CC (MISCELLANEOUS) ×2 IMPLANT
CHLORAPREP W/TINT 26ML (MISCELLANEOUS) ×2 IMPLANT
CLIP APPLIE 5 13 M/L LIGAMAX5 (MISCELLANEOUS) ×1 IMPLANT
COVER SURGICAL LIGHT HANDLE (MISCELLANEOUS) ×2 IMPLANT
ELECT REM PT RETURN 9FT ADLT (ELECTROSURGICAL) ×2
ELECTRODE REM PT RTRN 9FT ADLT (ELECTROSURGICAL) ×1 IMPLANT
GLOVE BIO SURGEON STRL SZ7.5 (GLOVE) ×1 IMPLANT
GLOVE BIOGEL PI IND STRL 6.5 (GLOVE) IMPLANT
GLOVE BIOGEL PI IND STRL 7.0 (GLOVE) IMPLANT
GLOVE BIOGEL PI IND STRL 7.5 (GLOVE) IMPLANT
GLOVE BIOGEL PI INDICATOR 6.5 (GLOVE) ×1
GLOVE BIOGEL PI INDICATOR 7.0 (GLOVE) ×1
GLOVE BIOGEL PI INDICATOR 7.5 (GLOVE) ×2
GLOVE SURG SIGNA 7.5 PF LTX (GLOVE) ×2 IMPLANT
GLOVE SURG SS PI 6.5 STRL IVOR (GLOVE) ×1 IMPLANT
GLOVE SURG SS PI 7.5 STRL IVOR (GLOVE) ×1 IMPLANT
GOWN STRL REUS W/ TWL LRG LVL3 (GOWN DISPOSABLE) ×2 IMPLANT
GOWN STRL REUS W/ TWL XL LVL3 (GOWN DISPOSABLE) ×1 IMPLANT
GOWN STRL REUS W/TWL LRG LVL3 (GOWN DISPOSABLE) ×6
GOWN STRL REUS W/TWL XL LVL3 (GOWN DISPOSABLE) ×2
KIT BASIN OR (CUSTOM PROCEDURE TRAY) ×2 IMPLANT
KIT ROOM TURNOVER OR (KITS) ×2 IMPLANT
LIQUID BAND (GAUZE/BANDAGES/DRESSINGS) ×2 IMPLANT
NS IRRIG 1000ML POUR BTL (IV SOLUTION) ×2 IMPLANT
PAD ARMBOARD 7.5X6 YLW CONV (MISCELLANEOUS) ×2 IMPLANT
POUCH SPECIMEN RETRIEVAL 10MM (ENDOMECHANICALS) ×2 IMPLANT
SCISSORS LAP 5X35 DISP (ENDOMECHANICALS) ×2 IMPLANT
SET IRRIG TUBING LAPAROSCOPIC (IRRIGATION / IRRIGATOR) ×2 IMPLANT
SLEEVE ENDOPATH XCEL 5M (ENDOMECHANICALS) ×4 IMPLANT
SOLUTION ANTI FOG 6CC (MISCELLANEOUS) ×1 IMPLANT
SPECIMEN JAR SMALL (MISCELLANEOUS) ×2 IMPLANT
SUT MON AB 4-0 PC3 18 (SUTURE) ×2 IMPLANT
SUT VICRYL 0 UR6 27IN ABS (SUTURE) ×1 IMPLANT
TOWEL OR 17X24 6PK STRL BLUE (TOWEL DISPOSABLE) ×2 IMPLANT
TOWEL OR 17X26 10 PK STRL BLUE (TOWEL DISPOSABLE) ×1 IMPLANT
TRAY LAPAROSCOPIC MC (CUSTOM PROCEDURE TRAY) ×2 IMPLANT
TROCAR XCEL BLUNT TIP 100MML (ENDOMECHANICALS) ×2 IMPLANT
TROCAR XCEL NON-BLD 5MMX100MML (ENDOMECHANICALS) ×2 IMPLANT
TUBING INSUFFLATION (TUBING) ×2 IMPLANT

## 2016-01-24 NOTE — Transfer of Care (Signed)
Immediate Anesthesia Transfer of Care Note  Patient: Melissa [name]  Procedure(s) Performed: Procedure(s): LAPAROSCOPIC CHOLECYSTECTOMY (N/A)  Patient Location: PACU  Anesthesia Type:General  Level of Consciousness: awake, alert , oriented and patient cooperative  Airway & Oxygen Therapy: Patient Spontanous Breathing and Patient connected to nasal cannula oxygen  Post-op Assessment: Report given to RN, Post -op Vital signs reviewed and stable and Patient moving all extremities  Post vital signs: Reviewed and stable  Last Vitals:  Filed Vitals:   01/24/16 0816  BP: 132/81  Pulse: 94  Temp: 36.7 C  Resp: 20    Complications: No apparent anesthesia complications

## 2016-01-24 NOTE — Anesthesia Postprocedure Evaluation (Signed)
Anesthesia Post Note  Patient: Melissa [name]  Procedure(s) Performed: Procedure(s) (LRB): LAPAROSCOPIC CHOLECYSTECTOMY (N/A)  Patient location during evaluation: PACU Anesthesia Type: General Level of consciousness: awake and alert Pain management: pain level controlled Vital Signs Assessment: post-procedure vital signs reviewed and stable Respiratory status: spontaneous breathing, nonlabored ventilation, respiratory function stable and patient connected to nasal cannula oxygen Cardiovascular status: blood pressure returned to baseline and stable Postop Assessment: no signs of nausea or vomiting Anesthetic complications: no    Last Vitals:  Filed Vitals:   01/24/16 1155 01/24/16 1410  BP: 135/54 130/60  Pulse: 84 88  Temp: 36.4 C 36.4 C  Resp: 15 14    Last Pain:  Filed Vitals:   01/24/16 1629  PainSc: 0-No pain                 Shemika Robbs, Norberta JENNETTE

## 2016-01-24 NOTE — Anesthesia Preprocedure Evaluation (Addendum)
Anesthesia Evaluation  Patient identified by MRN, date of birth, ID band Patient awake    Reviewed: Allergy & Precautions, NPO status , Patient's Chart, lab work & pertinent test results  History of Anesthesia Complications Negative for: history of anesthetic complications  Airway Mallampati: II  TM Distance: >3 FB Neck ROM: Full    Dental no notable dental hx. (+) Edentulous Upper, Edentulous Lower   Pulmonary asthma , sleep apnea , former smoker,    Pulmonary exam normal breath sounds clear to auscultation       Cardiovascular hypertension, Pt. on medications +CHF  Normal cardiovascular exam+ dysrhythmias Atrial Fibrillation + Valvular Problems/Murmurs  Rhythm:Regular Rate:Normal  Echo 11/16/15:  - Left ventricle: The cavity size was mildly dilated. Wall thickness was normal. Systolic function was moderately to severely reduced. The estimated ejection fraction was in the range of 30% to 35%. Diffuse hypokinesis. Doppler parameters are consistent with abnormal left ventricular relaxation (grade 1 diastolic dysfunction). - Mitral valve: Calcified annulus. There was mild regurgitation. Valve area by pressure half-time: 2.21 cm^2. - Impressions: Limited study; full doppler not performed; moderate to severe LV dysfunction; grade 1 diastolic dysfunction; mitral valve with mild rheumatic appearance but no MS by pressure half time; mild MR; trace TR.  Carotid duplex 11/15/15:  - Findings consistent with 60 - 79 percent stenosis involving the L ICA - Evidence 1-39% stenosis in the R ICA  Nuclear stress test 11/15/15: Normal resting and stress perfusion EF EF 28% most consistent with non ischemic cardiomyopathy   Neuro/Psych  Headaches, PSYCHIATRIC DISORDERS Anxiety TIA   GI/Hepatic Neg liver ROS, GERD  Medicated and Controlled,  Endo/Other  diabetesHypothyroidism   Renal/GU Renal disease  negative genitourinary    Musculoskeletal  (+) Arthritis , Osteoarthritis,    Abdominal   Peds negative pediatric ROS (+)  Hematology negative hematology ROS (+)   Anesthesia Other Findings   Reproductive/Obstetrics negative OB ROS                            Anesthesia Physical Anesthesia Plan  ASA: III  Anesthesia Plan: General   Post-op Pain Management:    Induction: Intravenous  Airway Management Planned: Oral ETT  Additional Equipment:   Intra-op Plan:   Post-operative Plan: Extubation in OR  Informed Consent: I have reviewed the patients History and Physical, chart, labs and discussed the procedure including the risks, benefits and alternatives for the proposed anesthesia with the patient or authorized representative who has indicated his/her understanding and acceptance.   Dental advisory given  Plan Discussed with: CRNA  Anesthesia Plan Comments:         Anesthesia Quick Evaluation

## 2016-01-24 NOTE — Interval H&P Note (Signed)
History and Physical Interval Note: no change in H and P  01/24/2016 7:59 AM  Melissa [name]  has presented today for surgery, with the diagnosis of Symptomatic cholelithiasis  The various methods of treatment have been discussed with the patient and family. After consideration of risks, benefits and other options for treatment, the patient has consented to  Procedure(s): LAPAROSCOPIC CHOLECYSTECTOMY (N/A) as a surgical intervention .  The patient's history has been reviewed, patient examined, no change in status, stable for surgery.  I have reviewed the patient's chart and labs.  Questions were answered to the patient's satisfaction.     Cyndy Braver A

## 2016-01-24 NOTE — Anesthesia Procedure Notes (Signed)
Procedure Name: Intubation Date/Time: 01/24/2016 9:31 AM Performed by: Myna Bright Pre-anesthesia Checklist: Patient identified, Emergency Drugs available, Suction available and Patient being monitored Patient Re-evaluated:Patient Re-evaluated prior to inductionOxygen Delivery Method: Circle system utilized Preoxygenation: Pre-oxygenation with 100% oxygen Intubation Type: IV induction Ventilation: Mask ventilation without difficulty Laryngoscope Size: Mac and 3 Grade View: Grade I Tube type: Oral Tube size: 7.5 mm Number of attempts: 1 Airway Equipment and Method: Stylet Placement Confirmation: ETT inserted through vocal cords under direct vision,  positive ETCO2 and breath sounds checked- equal and bilateral Secured at: 21 cm Tube secured with: Tape Dental Injury: Teeth and Oropharynx as per pre-operative assessment

## 2016-01-24 NOTE — Op Note (Signed)

## 2016-01-25 ENCOUNTER — Other Ambulatory Visit: Payer: Medicare Other

## 2016-01-25 ENCOUNTER — Encounter (HOSPITAL_COMMUNITY): Payer: Self-pay | Admitting: Surgery

## 2016-01-25 DIAGNOSIS — N189 Chronic kidney disease, unspecified: Secondary | ICD-10-CM | POA: Diagnosis not present

## 2016-01-25 DIAGNOSIS — Z79899 Other long term (current) drug therapy: Secondary | ICD-10-CM | POA: Diagnosis not present

## 2016-01-25 DIAGNOSIS — K8010 Calculus of gallbladder with chronic cholecystitis without obstruction: Secondary | ICD-10-CM | POA: Diagnosis not present

## 2016-01-25 DIAGNOSIS — I130 Hypertensive heart and chronic kidney disease with heart failure and stage 1 through stage 4 chronic kidney disease, or unspecified chronic kidney disease: Secondary | ICD-10-CM | POA: Diagnosis not present

## 2016-01-25 DIAGNOSIS — Z7901 Long term (current) use of anticoagulants: Secondary | ICD-10-CM | POA: Diagnosis not present

## 2016-01-25 DIAGNOSIS — Z87891 Personal history of nicotine dependence: Secondary | ICD-10-CM | POA: Diagnosis not present

## 2016-01-25 LAB — GLUCOSE, CAPILLARY: Glucose-Capillary: 102 mg/dL — ABNORMAL HIGH (ref 65–99)

## 2016-01-25 MED ORDER — TRAMADOL HCL 50 MG PO TABS: 50.0000 mg | ORAL_TABLET | Freq: Four times a day (QID) | ORAL | Status: DC | PRN

## 2016-01-25 NOTE — Discharge Instructions (Signed)
[facility] ______[facility] °LAPAROSCOPIC SURGERY: POST OP INSTRUCTIONS °Always review your discharge instruction sheet given to you by the facility where your surgery was performed. °IF YOU HAVE DISABILITY OR FAMILY LEAVE FORMS, YOU MUST BRING THEM TO THE OFFICE FOR PROCESSING.   °DO NOT GIVE THEM TO YOUR DOCTOR. ° °1. A prescription for pain medication may be given to you upon discharge.  Take your pain medication as prescribed, if needed.  If narcotic pain medicine is not needed, then you may take acetaminophen (Tylenol) or ibuprofen (Advil) as needed. °2. Take your usually prescribed medications unless otherwise directed. °3. If you need a refill on your pain medication, please contact your pharmacy.  They will contact our office to request authorization. Prescriptions will not be filled after 5pm or on week-ends. °4. You should follow a light diet the first few days after arrival home, such as soup and crackers, etc.  Be sure to include lots of fluids daily. °5. Most patients will experience some swelling and bruising in the area of the incisions.  Ice packs will help.  Swelling and bruising can take several days to resolve.  °6. It is common to experience some constipation if taking pain medication after surgery.  Increasing fluid intake and taking a stool softener (such as Colace) will usually help or prevent this problem from occurring.  A mild laxative (Milk of Magnesia or Miralax) should be taken according to package instructions if there are no bowel movements after 48 hours. °7. Unless discharge instructions indicate otherwise, you may remove your bandages 24-48 hours after surgery, and you may shower at that time.  You may have steri-strips (small skin tapes) in place directly over the incision.  These strips should be left on the skin for 7-10 days.  If your surgeon used skin glue on the incision, you may shower in 24 hours.  The glue will flake off over the next 2-3 weeks.  Any sutures or  staples will be removed at the office during your follow-up visit. °8. ACTIVITIES:  You may resume regular (light) daily activities beginning the next day--such as daily self-care, walking, climbing stairs--gradually increasing activities as tolerated.  You may have sexual intercourse when it is comfortable.  Refrain from any heavy lifting or straining until approved by your doctor. °a. You may drive when you are no longer taking prescription pain medication, you can comfortably wear a seatbelt, and you can safely maneuver your car and apply brakes. °b. RETURN TO WORK:  __________________________________________________________ °9. You should see your doctor in the office for a follow-up appointment approximately 2-3 weeks after your surgery.  Make sure that you call for this appointment within a day or two after you arrive home to insure a convenient appointment time. °10. OTHER INSTRUCTIONS: __________________________________________________________________________________________________________________________ __________________________________________________________________________________________________________________________ °WHEN TO CALL YOUR DOCTOR: °1. Fever over 101.0 °2. Inability to urinate °3. Continued bleeding from incision. °4. Increased pain, redness, or drainage from the incision. °5. Increasing abdominal pain ° °The clinic staff is available to answer your questions during regular business hours.  Please don’t hesitate to call and ask to speak to one of the nurses for clinical concerns.  If you have a medical emergency, go to the nearest emergency room or call 911.  A surgeon from [facility] is always on call at the hospital. °[address], [address], [place], [place]  [address] ? [address] [address], [place], [place]   [address] °[phone] ? [phone] ? FAX [phone] °Web site:   [email or web address] °

## 2016-01-25 NOTE — Progress Notes (Signed)
Patient discharged to home with instructions. 

## 2016-01-25 NOTE — Progress Notes (Signed)
Patient ID: Melissa [name], female   DOB: 1927-11-14, 82 y.o.   MRN: [id number]  Pt comfortable except for a little heartburn Af/vss Lungs clear Abdomen soft, minimally tender  Plan: Discharge home

## 2016-01-25 NOTE — Discharge Summary (Signed)
Physician Discharge Summary  Patient ID: Melissa [name] MRN: [id number] DOB/AGE: 03-23-1928 82 y.o.  Admit date: 01/24/2016 Discharge date: 01/25/2016  Admission Diagnoses:  Discharge Diagnoses:  Active Problems:   Symptomatic cholelithiasis   Discharged Condition: good  Hospital Course: uneventful post op recovery s/p lap chole. Discharge home POD#1  Consults: None  Significant Diagnostic Studies:   Treatments: surgery: laparoscopic cholecystectomy  Discharge Exam: Blood pressure 127/53, pulse 77, temperature 98.2 F (36.8 C), temperature source Oral, resp. rate 18, height 5\' 5"  (1.651 m), weight 71.215 kg (157 lb), SpO2 100 %. General appearance: alert, cooperative and no distress Resp: clear to auscultation bilaterally Incision/Wound:abdomen soft, incisions clean  Disposition: 01-Home or Self Care     Medication List    TAKE these medications        acetaminophen 500 MG tablet  Commonly known as:  TYLENOL  Take 1,000 mg by mouth every 6 (six) hours as needed for mild pain. Reported on 11/02/2015     albuterol (2.5 MG/3ML) 0.083% nebulizer solution  Commonly known as:  PROVENTIL  Take 2.5 mg by nebulization 2 (two) times daily as needed for wheezing or shortness of breath.     apixaban 2.5 MG Tabs tablet  Commonly known as:  ELIQUIS  Take 1 tablet (2.5 mg total) by mouth 2 (two) times daily.     atorvastatin 40 MG tablet  Commonly known as:  LIPITOR  Take 1 tablet (40 mg total) by mouth daily at 6 PM.     ciprofloxacin 250 MG tablet  Commonly known as:  CIPRO  Take 250 mg by mouth daily. daily     esomeprazole 40 MG capsule  Commonly known as:  NEXIUM  Take 40 mg by mouth 2 (two) times daily.     fexofenadine 180 MG tablet  Commonly known as:  ALLEGRA  Take 180 mg by mouth daily as needed for allergies.     Fish Oil 1000 MG Caps  Take 1,000 mg by mouth daily.     FLUoxetine 20 MG capsule  Commonly known as:  PROZAC  Take 20 mg by mouth daily.      fluticasone 50 MCG/ACT nasal spray  Commonly known as:  FLONASE  Place 2 sprays into both nostrils daily.     INTEGRA 62.5-62.5-40-3 MG Caps  Take 1 capsule by mouth daily.     levalbuterol 0.63 MG/3ML nebulizer solution  Commonly known as:  XOPENEX  Take 0.63 mg by nebulization daily as needed for wheezing or shortness of breath.     LEVEMIR FLEXTOUCH 100 UNIT/ML Pen  Generic drug:  Insulin Detemir  INJECT 34 UNITS            SUBCUTANEOUSLY AT BEDTIME     levothyroxine 50 MCG tablet  Commonly known as:  SYNTHROID, LEVOTHROID  TAKE 1 TABLET BY MOUTH EVERY DAY     losartan 25 MG tablet  Commonly known as:  COZAAR  Take 1 tablet (25 mg total) by mouth daily.     metoprolol succinate 50 MG 24 hr tablet  Commonly known as:  TOPROL XL  Take 1 tablet (50 mg total) by mouth daily.     NOVOLOG [place]  Inject 4 Units into the skin 3 (three) times daily.     ondansetron 4 MG tablet  Commonly known as:  ZOFRAN  Take 1 tablet (4 mg total) by mouth every 8 (eight) hours as needed for nausea or vomiting.     pramipexole 0.5 MG tablet  Commonly known as:  MIRAPEX  TAKE 1 TABLET (0.25 MG TOTAL) BY MOUTH AT BEDTIME.     pregabalin 50 MG capsule  Commonly known as:  LYRICA  Take 50 mg by mouth 3 (three) times daily as needed (For pain(diabetes)).     traMADol 50 MG tablet  Commonly known as:  ULTRAM  Take 1-2 tablets (50-100 mg total) by mouth every 6 (six) hours as needed for moderate pain.     traZODone 50 MG tablet  Commonly known as:  DESYREL  Take 75 mg by mouth at bedtime.     VICTOZA 18 MG/3ML Sopn  Generic drug:  Liraglutide  INJECT 1.2MG  SUBCUTANEOUSLYDAILY (MAKE AN APPOINTMENT FOR FURTHER REFILLS)     Vitamin D3 1000 units Caps  Take 2,000 Units by mouth daily.           Follow-up Information    Follow up with [facility] A, MD. Schedule an appointment as soon as possible for a visit in 3 weeks.   Specialty:  General Surgery   Contact information:   [address] [place] [address] [phone]       Signed: Harl Bowie 01/25/2016, 6:48 AM

## 2016-01-31 ENCOUNTER — Other Ambulatory Visit: Payer: Self-pay | Admitting: *Deleted

## 2016-01-31 ENCOUNTER — Ambulatory Visit (INDEPENDENT_AMBULATORY_CARE_PROVIDER_SITE_OTHER): Payer: Medicare Other | Admitting: Endocrinology

## 2016-01-31 ENCOUNTER — Encounter: Payer: Self-pay | Admitting: Endocrinology

## 2016-01-31 VITALS — BP 136/72 | HR 102 | Temp 97.6°F | Resp 14 | Ht 65.0 in | Wt 154.0 lb

## 2016-01-31 DIAGNOSIS — Z794 Long term (current) use of insulin: Secondary | ICD-10-CM | POA: Diagnosis not present

## 2016-01-31 DIAGNOSIS — N289 Disorder of kidney and ureter, unspecified: Secondary | ICD-10-CM

## 2016-01-31 DIAGNOSIS — E1165 Type 2 diabetes mellitus with hyperglycemia: Secondary | ICD-10-CM

## 2016-01-31 DIAGNOSIS — E039 Hypothyroidism, unspecified: Secondary | ICD-10-CM

## 2016-01-31 MED ORDER — INSULIN ASPART 100 UNIT/ML FLEXPEN: 4.0000 [IU] | PEN_INJECTOR | Freq: Three times a day (TID) | SUBCUTANEOUS | Status: DC

## 2016-01-31 NOTE — Patient Instructions (Addendum)
Levemir 24 units at nite  No Novolog if not eating starchy foods or sugar below 100  Check blood sugars on waking up 3-4  times a week Also check blood sugars about 2 hours after a meal and do this after different meals by rotation  Recommended blood sugar levels on waking up is 90-130 and about 2 hours after meal is 130-160  Please bring your blood sugar monitor to each visit, thank you
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
## 2016-02-02 ENCOUNTER — Other Ambulatory Visit: Payer: Self-pay | Admitting: *Deleted

## 2016-02-02 ENCOUNTER — Telehealth: Payer: Self-pay | Admitting: Endocrinology

## 2016-02-02 MED ORDER — GLUCOSE BLOOD VI STRP: ORAL_STRIP | Status: DC

## 2016-02-02 MED ORDER — INSULIN PEN NEEDLE 32G X 4 MM MISC: Status: DC

## 2016-02-02 NOTE — Telephone Encounter (Signed)
90 days supply Insulin pen needles need to be sent into local [facility] [address].  [facility] won't ship them in time and she is out. Daughter CB # if any questions [phone]

## 2016-02-02 NOTE — Telephone Encounter (Signed)
rx [facility] to [facility] [address].

## 2016-02-05 ENCOUNTER — Other Ambulatory Visit: Payer: Self-pay | Admitting: *Deleted

## 2016-02-05 MED ORDER — PREGABALIN 50 MG PO CAPS: 50.0000 mg | ORAL_CAPSULE | Freq: Three times a day (TID) | ORAL | Status: DC | PRN

## 2016-02-19 ENCOUNTER — Ambulatory Visit (INDEPENDENT_AMBULATORY_CARE_PROVIDER_SITE_OTHER): Payer: Medicare Other | Admitting: *Deleted

## 2016-02-19 DIAGNOSIS — E538 Deficiency of other specified B group vitamins: Secondary | ICD-10-CM

## 2016-02-19 NOTE — Progress Notes (Signed)
Pt here for B 12 injection.  Under aseptic technique cyanocobalamin 1000mcg/1ml IM given R deltoid.  Tolerated well.  Bandaid applied.  

## 2016-02-24 ENCOUNTER — Other Ambulatory Visit: Payer: Self-pay | Admitting: Endocrinology

## 2016-03-18 DIAGNOSIS — Z862 Personal history of diseases of the blood and blood-forming organs and certain disorders involving the immune mechanism: Secondary | ICD-10-CM | POA: Diagnosis not present

## 2016-03-18 DIAGNOSIS — N3000 Acute cystitis without hematuria: Secondary | ICD-10-CM | POA: Diagnosis not present

## 2016-03-18 DIAGNOSIS — H6121 Impacted cerumen, right ear: Secondary | ICD-10-CM | POA: Diagnosis not present

## 2016-03-21 ENCOUNTER — Ambulatory Visit: Payer: Medicare Other

## 2016-03-28 ENCOUNTER — Ambulatory Visit: Payer: Medicare Other

## 2016-04-01 ENCOUNTER — Ambulatory Visit (INDEPENDENT_AMBULATORY_CARE_PROVIDER_SITE_OTHER): Payer: Medicare Other | Admitting: Endocrinology

## 2016-04-01 ENCOUNTER — Encounter: Payer: Self-pay | Admitting: Endocrinology

## 2016-04-01 ENCOUNTER — Ambulatory Visit (INDEPENDENT_AMBULATORY_CARE_PROVIDER_SITE_OTHER): Payer: Self-pay | Admitting: *Deleted

## 2016-04-01 VITALS — BP 126/60 | HR 105 | Ht 65.0 in | Wt 156.0 lb

## 2016-04-01 DIAGNOSIS — E039 Hypothyroidism, unspecified: Secondary | ICD-10-CM | POA: Diagnosis not present

## 2016-04-01 DIAGNOSIS — E1165 Type 2 diabetes mellitus with hyperglycemia: Secondary | ICD-10-CM

## 2016-04-01 DIAGNOSIS — E538 Deficiency of other specified B group vitamins: Secondary | ICD-10-CM

## 2016-04-01 DIAGNOSIS — Z794 Long term (current) use of insulin: Secondary | ICD-10-CM | POA: Diagnosis not present

## 2016-04-01 NOTE — Progress Notes (Signed)
Pt here for B 12 injection.  Under aseptic technique cyanocobalamin 1000mcg/1ml IM given R deltoid.  Tolerated well.  Bandaid applied.  

## 2016-04-01 NOTE — Patient Instructions (Signed)
See next month. 
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
## 2016-04-01 NOTE — Patient Instructions (Signed)
4 units of NovoLog at supper  If sugar at bedtime still > 180 go up to 5 or 6 units  Glucose test in am only 2-3 per week  Levemir 22 units at nite

## 2016-04-08 ENCOUNTER — Other Ambulatory Visit: Payer: Self-pay | Admitting: Cardiology

## 2016-04-09 ENCOUNTER — Other Ambulatory Visit: Payer: Self-pay | Admitting: *Deleted

## 2016-04-09 MED ORDER — APIXABAN 2.5 MG PO TABS: 2.5000 mg | ORAL_TABLET | Freq: Two times a day (BID) | ORAL | Status: DC

## 2016-04-18 ENCOUNTER — Encounter: Payer: Self-pay | Admitting: Adult Health

## 2016-04-18 ENCOUNTER — Ambulatory Visit (INDEPENDENT_AMBULATORY_CARE_PROVIDER_SITE_OTHER): Payer: Medicare Other | Admitting: Adult Health

## 2016-04-18 VITALS — BP 112/58 | HR 70 | Resp 20 | Ht 65.0 in | Wt 155.0 lb

## 2016-04-18 DIAGNOSIS — E538 Deficiency of other specified B group vitamins: Secondary | ICD-10-CM

## 2016-04-18 DIAGNOSIS — R202 Paresthesia of skin: Secondary | ICD-10-CM | POA: Diagnosis not present

## 2016-04-18 DIAGNOSIS — E1142 Type 2 diabetes mellitus with diabetic polyneuropathy: Secondary | ICD-10-CM | POA: Diagnosis not present
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
## 2016-04-18 NOTE — Progress Notes (Signed)
I have read the note, and I agree with the clinical assessment and plan.  Ranessa Kosta KEITH   

## 2016-04-18 NOTE — Patient Instructions (Addendum)
mirapex  0.5 mg daily B12 level today If your symptoms worsen or you develop new symptoms please let us know.

## 2016-04-19 LAB — VITAMIN B12: VITAMIN B 12: 609 pg/mL (ref 211–946)

## 2016-04-23 ENCOUNTER — Telehealth: Payer: Self-pay

## 2016-04-23 NOTE — Telephone Encounter (Signed)
-----   Message from Ward Givens, NP sent at 04/22/2016 10:38 AM EDT ----- Lab work unremarkable. Please call patient.

## 2016-04-23 NOTE — Telephone Encounter (Signed)
I spoke to granddaughter who was with the patient and gave results below. They voiced understanding.

## 2016-05-02 ENCOUNTER — Ambulatory Visit (INDEPENDENT_AMBULATORY_CARE_PROVIDER_SITE_OTHER): Payer: Medicare Other | Admitting: *Deleted

## 2016-05-02 DIAGNOSIS — E538 Deficiency of other specified B group vitamins: Secondary | ICD-10-CM

## 2016-05-02 MED ORDER — CYANOCOBALAMIN 1000 MCG/ML IJ SOLN
1000.0000 ug | INTRAMUSCULAR | Status: AC
  Administered 2016-05-02 – 2016-10-09 (×6): 1000 ug via INTRAMUSCULAR

## 2016-05-02 NOTE — Progress Notes (Signed)
Pt here for B 12 injection.  Under aseptic technique cyanocobalamin 1000mcg/1ml IM given L deltoid.  Tolerated well.  Bandaid applied.  

## 2016-05-02 NOTE — Patient Instructions (Signed)
See next month. 

## 2016-05-04 ENCOUNTER — Other Ambulatory Visit: Payer: Self-pay | Admitting: Neurology

## 2016-05-04 ENCOUNTER — Other Ambulatory Visit: Payer: Self-pay | Admitting: Cardiology

## 2016-05-10 ENCOUNTER — Ambulatory Visit (INDEPENDENT_AMBULATORY_CARE_PROVIDER_SITE_OTHER): Payer: Medicare Other | Admitting: Physician Assistant

## 2016-05-10 VITALS — BP 110/70 | HR 116 | Temp 98.1°F | Resp 18 | Ht 65.0 in | Wt 154.8 lb

## 2016-05-10 DIAGNOSIS — N390 Urinary tract infection, site not specified: Secondary | ICD-10-CM

## 2016-05-10 DIAGNOSIS — R339 Retention of urine, unspecified: Secondary | ICD-10-CM

## 2016-05-10 DIAGNOSIS — K5900 Constipation, unspecified: Secondary | ICD-10-CM | POA: Diagnosis not present

## 2016-05-10 LAB — POCT URINALYSIS DIP (MANUAL ENTRY)
Bilirubin, UA: NEGATIVE
Blood, UA: NEGATIVE
Glucose, UA: NEGATIVE
Ketones, POC UA: NEGATIVE
Leukocytes, UA: NEGATIVE
Nitrite, UA: POSITIVE — AB
PROTEIN UA: NEGATIVE
Spec Grav, UA: 1.01
UROBILINOGEN UA: 0.2
pH, UA: 5

## 2016-05-10 MED ORDER — DOCUSATE SODIUM 50 MG PO CAPS: 50.0000 mg | ORAL_CAPSULE | Freq: Every day | ORAL | 0 refills | Status: DC | PRN

## 2016-05-10 MED ORDER — SULFAMETHOXAZOLE-TRIMETHOPRIM 800-160 MG PO TABS: 1.0000 | ORAL_TABLET | Freq: Two times a day (BID) | ORAL | 0 refills | Status: AC

## 2016-05-10 MED ORDER — PSYLLIUM 0.52 G PO CAPS: ORAL_CAPSULE | ORAL | 0 refills | Status: DC

## 2016-05-10 NOTE — Patient Instructions (Addendum)
If you are not having any improvement with urination tomorrow, please come back and we will put in a foley to relieve urinary retention and we will also give you a referral for urology.  Go pick up the antibiotics. Take 1pill two times a day for the next five days. Stop taking the macrobid while on the bactrim.   Also, try to urinate every 3-4 houres even if you do not have the urgency.  Please pick up fiber and docusate from the pharmacy. Take docusate twice a day for at least 1 week. Start with one pill a day of fiber, increase to 2-3 pills a day for the next week. If stools become loose, cut down to once a day for ~1 week. If stools remain loose, cut back to 1/2 pill for ~1 week. You can stop docusate thereafter and resume as needed for constipation.     Urinary Tract Infection Urinary tract infections (UTIs) can develop anywhere along your urinary tract. Your urinary tract is your body's drainage system for removing wastes and extra water. Your urinary tract includes two kidneys, two ureters, a bladder, and a urethra. Your kidneys are a pair of bean-shaped organs. Each kidney is about the size of your fist. They are located below your ribs, one on each side of your spine. CAUSES Infections are caused by microbes, which are microscopic organisms, including fungi, viruses, and bacteria. These organisms are so small that they can only be seen through a microscope. Bacteria are the microbes that most commonly cause UTIs. SYMPTOMS  Symptoms of UTIs may vary by age and gender of the patient and by the location of the infection. Symptoms in young women typically include a frequent and intense urge to urinate and a painful, burning feeling in the bladder or urethra during urination. Older women and men are more likely to be tired, shaky, and weak and have muscle aches and abdominal pain. A fever may mean the infection is in your kidneys. Other symptoms of a kidney infection include pain in your back or  sides below the ribs, nausea, and vomiting. DIAGNOSIS To diagnose a UTI, your caregiver will ask you about your symptoms. Your caregiver will also ask you to provide a urine sample. The urine sample will be tested for bacteria and white blood cells. White blood cells are made by your body to help fight infection. TREATMENT  Typically, UTIs can be treated with medication. Because most UTIs are caused by a bacterial infection, they usually can be treated with the use of antibiotics. The choice of antibiotic and length of treatment depend on your symptoms and the type of bacteria causing your infection. HOME CARE INSTRUCTIONS  If you were prescribed antibiotics, take them exactly as your caregiver instructs you. Finish the medication even if you feel better after you have only taken some of the medication.  Drink enough water and fluids to keep your urine clear or pale yellow.  Avoid caffeine, tea, and carbonated beverages. They tend to irritate your bladder.  Empty your bladder often. Avoid holding urine for long periods of time.  Empty your bladder before and after sexual intercourse.  After a bowel movement, women should cleanse from front to back. Use each tissue only once. SEEK MEDICAL CARE IF:   You have back pain.  You develop a fever.  Your symptoms do not begin to resolve within 3 days. SEEK IMMEDIATE MEDICAL CARE IF:   You have severe back pain or lower abdominal pain.  You develop  chills.  You have nausea or vomiting.  You have continued burning or discomfort with urination. MAKE SURE YOU:   Understand these instructions.  Will watch your condition.  Will get help right away if you are not doing well or get worse.   This information is not intended to replace advice given to you by your health care provider. Make sure you discuss any questions you have with your health care provider.   Document Released: 07/10/2005 Document Revised: 06/21/2015 Document Reviewed:  11/08/2011 Elsevier Interactive Patient Education 2016 [employer].    IF you received an x-ray today, you will receive an invoice from [facility]. Please contact [facility] at [phone] with questions or concerns regarding your invoice.   IF you received labwork today, you will receive an invoice from [employer]. Please contact [facility] at [phone] with questions or concerns regarding your invoice.   Our billing staff will not be able to assist you with questions regarding bills from these companies.  You will be contacted with the lab results as soon as they are available. The fastest way to get your results is to activate your My Chart account. Instructions are located on the last page of this paperwork. If you have not heard from [place] regarding the results in 2 weeks, please contact this office.

## 2016-05-10 NOTE — Progress Notes (Signed)
Melissa [name]  MRN: [id number] DOB: 1928/03/07  Subjective:  Melissa [name] is a 82 y.o. female seen in office today for a chief complaint of urinary retention x  2 days.   Was having generalized abdominal pain four days ago, notes this pain resolved but then had a few episodes of diarrhea yesterday. Pt urinated normally yesterday before noon but since then has only produced minimal urine (drizzled amount). Has associated abdominal distension and lower abdominal pain. Denies urinary frequency, urgency, dysuria, and vaginal discharge.   Of note, pt had bladder tacked over 50 years ago low in her abdominal cavity so she could avoid performing self caths daily. She now pees in a cup standing up daily.   A few years go pt was treated prophylactically with macrobid daily for hx of UTIs.     Review of Systems  Constitutional: Negative for chills, fatigue and fever.  Gastrointestinal: Positive for constipation (notes she takes medication for chronic constipation) and diarrhea. Negative for blood in stool, nausea and vomiting.  Neurological: Positive for dizziness and light-headedness.  Psychiatric/Behavioral: Negative for confusion.    Patient Active Problem List   Diagnosis Date Noted  . Symptomatic cholelithiasis 01/24/2016  . 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 TIA (transient ischemic attack)   . Acute sinusitis 11/15/2015  . Chest pain with moderate risk for cardiac etiology   . Chest pain with moderate risk of acute coronary syndrome 11/14/2015  . Left upper extremity numbness   . Paresthesia 08/22/2015  . Diabetic peripheral neuropathy associated with type 2 diabetes mellitus ([place]) 08/22/2015  . Tremor, essential 08/22/2015  . Paroxysmal atrial fibrillation ([place]) 03/06/2015  . Chronic anticoagulation 03/06/2015  . Orthostatic hypotension 03/06/2015  . Essential hypertension 04/19/2014  . Chronic kidney disease, stage III (moderate) 04/19/2014  .  Unspecified hypothyroidism 06/15/2013  . Anemia 06/10/2013  . Left ventricular dysfunction 09/26/2011  . Asthma exacerbation 09/23/2011  . Type 2 diabetes mellitus with renal manifestations ([place]) 09/22/2011  . HLD (hyperlipidemia) 09/22/2011    Current Outpatient Prescriptions on File Prior to Visit  Medication Sig Dispense Refill  . acetaminophen (TYLENOL) 500 MG tablet Take 1,000 mg by mouth every 6 (six) hours as needed for mild pain. Reported on 11/02/2015    . albuterol (PROVENTIL) (2.5 MG/3ML) 0.083% nebulizer solution Take 2.5 mg by nebulization 2 (two) times daily as needed for wheezing or shortness of breath.     Marland Kitchen apixaban (ELIQUIS) 2.5 MG TABS tablet Take 1 tablet (2.5 mg total) by mouth 2 (two) times daily. 180 tablet 3  . atorvastatin (LIPITOR) 40 MG tablet Take 1 tablet (40 mg total) by mouth daily at 6 PM. (Patient taking differently: Take 40 mg by mouth every morning. ) 30 tablet 0  . cetirizine (ZYRTEC) 10 MG tablet Take 10 mg by mouth daily.    . Cholecalciferol (VITAMIN D3) 1000 UNITS CAPS Take 2,000 Units by mouth daily.     . cyanocobalamin (,VITAMIN B-12,) 1000 MCG/ML injection Inject 1,000 mcg into the muscle every 30 (thirty) days.    Marland Kitchen esomeprazole (NEXIUM) 40 MG capsule Take 40 mg by mouth 2 (two) times daily.      . Fe Fum-FePoly-Vit C-Vit B3 (INTEGRA) 62.5-62.5-40-3 MG CAPS Take 1 capsule by mouth daily.  3  . FLUoxetine (PROZAC) 20 MG capsule Take 20 mg by mouth daily.     . fluticasone (FLONASE) 50 MCG/ACT nasal spray Place 2 sprays into both nostrils daily.  2  . glucose blood (ONE TOUCH ULTRA TEST) test strip Use as instructed to check blood sugar 3 times per day dx code E11.29 300 each 1  . insulin aspart (NOVOLOG FLEXPEN) 100 UNIT/ML FlexPen Inject 4 Units into the skin 3 (three) times daily with meals. (Patient taking differently: Inject 5 Units into the skin 3 (three) times daily with meals. ) 15 mL 3  . Insulin Pen Needle (BD PEN NEEDLE NANO U/F) 32G X 4 MM  MISC Use 5 per day to inject insulin and victoza 450 each 1  . levalbuterol (XOPENEX) 0.63 MG/3ML nebulizer solution Take 0.63 mg by nebulization daily as needed for wheezing or shortness of breath.   0  . LEVEMIR FLEXTOUCH 100 UNIT/ML Pen INJECT 34 UNITS            SUBCUTANEOUSLY AT BEDTIME (Patient taking differently: INJECT 22 UNITS SUBCUTANEOUSLY AT BEDTIME) 45 mL 1  . levothyroxine (SYNTHROID, LEVOTHROID) 50 MCG tablet TAKE 1 TABLET BY MOUTH EVERY DAY 30 tablet 2  . losartan (COZAAR) 25 MG tablet Take 1 tablet (25 mg total) by mouth daily. 30 tablet 3  . metoprolol succinate (TOPROL-XL) 50 MG 24 hr tablet Take 1 tablet (50 mg total) by mouth daily. 90 tablet 3  . nitrofurantoin, macrocrystal-monohydrate, (MACROBID) 100 MG capsule Take 100 mg by mouth daily.     . Omega-3 Fatty Acids (FISH OIL) 1000 MG CAPS Take 1,000 mg by mouth daily.    . ondansetron (ZOFRAN) 4 MG tablet Take 1 tablet (4 mg total) by mouth every 8 (eight) hours as needed for nausea or vomiting. 20 tablet 0  . pramipexole (MIRAPEX) 0.5 MG tablet TAKE 1 TABLET (0.25 MG TOTAL) BY MOUTH AT BEDTIME. (Patient taking differently: Take 0.5 mg by mouth at bedtime. ) 90 tablet 3  . pregabalin (LYRICA) 50 MG capsule Take 1 capsule (50 mg total) by mouth 3 (three) times daily as needed (For pain(diabetes)). 270 capsule 1  . traMADol (ULTRAM) 50 MG tablet Take 1-2 tablets (50-100 mg total) by mouth every 6 (six) hours as needed for moderate pain. 30 tablet 0  . traZODone (DESYREL) 50 MG tablet Take 75 mg by mouth at bedtime.   0  . VICTOZA 18 MG/3ML SOPN INJECT 1.2MG  SUBCUTANEOUSLYDAILY (MAKE AN APPOINTMENT FOR FURTHER REFILLS) 18 mL 1   Current Facility-Administered Medications on File Prior to Visit  Medication Dose Route Frequency Provider Last Rate Last Dose  . cyanocobalamin ((VITAMIN B-12)) injection 1,000 mcg  1,000 mcg Intramuscular Q30 days Ward Givens, NP   1,000 mcg at 05/02/16 1510    No Known Allergies  Objective:    BP 110/70   Pulse (!) 116   Temp 98.1 F (36.7 C) (Oral)   Resp 18   Ht 5\' 5"  (1.651 m)   Wt 154 lb 12.8 oz (70.2 kg)   SpO2 95%   BMI 25.76 kg/m   Physical Exam  Constitutional: She is oriented to person, place, and time and well-developed, well-nourished, and in no distress.  HENT:  Head: Normocephalic and atraumatic.  Eyes: Conjunctivae are normal.  Neck: Normal range of motion.  Pulmonary/Chest: Effort normal.  Abdominal: Soft. Normal appearance and bowel sounds are normal. She exhibits distension. There is tenderness in the right lower quadrant, suprapubic area and left lower quadrant.  Neurological: She is alert and oriented to person, place, and time. Gait normal.  Skin: Skin is warm and dry.  Psychiatric: Affect normal.  Vitals reviewed.     Assessment  and Plan :   1. Urinary retention - POCT urinalysis dipstick - Urine culture -If no  improvement with urination tomorrow, please come back and we will put in a foley to relieve urinary retention and will also give a referral for urology -Try to urinate every 3-4 houres even if if no urgency.  2. Urinary tract infection, site not specified - sulfamethoxazole-trimethoprim (BACTRIM DS,SEPTRA DS) 800-160 MG tablet; Take 1 tablet by mouth 2 (two) times daily.  Dispense: 14 tablet; Refill: 0  3. Constipation, unspecified constipation type - docusate sodium (COLACE) 50 MG capsule; Take 1-2 capsules (50-100 mg total) by mouth daily as needed for mild constipation.  Dispense: 20 capsule; Refill: 0 - psyllium (METAMUCIL) 0.52 g capsule; Start off with one a day, then increase to 2-3 capsules a day as needed for constipation  Dispense: 30 capsule; Refill: 0   Tenna Delaine PA-C  [facility] 05/10/2016 12:22 PM

## 2016-05-10 NOTE — Progress Notes (Signed)
14 french catheter inserted without difficulty. Using sterile technique.500 cc yellow urine obtained. Urine specimen obtained and sent to lab.

## 2016-05-10 NOTE — Progress Notes (Signed)
Using sterile technique 14 french foley catheter. Urine specimen obtained and sent to lab. Provider notified. No complaints voiced.

## 2016-05-10 NOTE — Progress Notes (Signed)
Using aseptic technique foley cath removed. Total of 600 cc urine obtained

## 2016-05-13 LAB — URINE CULTURE

## 2016-05-30 DIAGNOSIS — R109 Unspecified abdominal pain: Secondary | ICD-10-CM | POA: Diagnosis not present

## 2016-05-30 DIAGNOSIS — R1114 Bilious vomiting: Secondary | ICD-10-CM | POA: Diagnosis not present

## 2016-05-30 DIAGNOSIS — R110 Nausea: Secondary | ICD-10-CM | POA: Diagnosis not present

## 2016-05-31 ENCOUNTER — Other Ambulatory Visit: Payer: Self-pay | Admitting: Gastroenterology

## 2016-05-31 DIAGNOSIS — R1084 Generalized abdominal pain: Secondary | ICD-10-CM | POA: Diagnosis not present

## 2016-05-31 DIAGNOSIS — R112 Nausea with vomiting, unspecified: Secondary | ICD-10-CM | POA: Diagnosis not present

## 2016-05-31 DIAGNOSIS — K2970 Gastritis, unspecified, without bleeding: Secondary | ICD-10-CM | POA: Diagnosis not present

## 2016-05-31 DIAGNOSIS — K449 Diaphragmatic hernia without obstruction or gangrene: Secondary | ICD-10-CM | POA: Diagnosis not present

## 2016-05-31 DIAGNOSIS — R8569 Abnormal cytological findings in specimens from other digestive organs and abdominal cavity: Secondary | ICD-10-CM | POA: Diagnosis not present

## 2016-06-04 ENCOUNTER — Encounter (INDEPENDENT_AMBULATORY_CARE_PROVIDER_SITE_OTHER): Payer: Medicare Other

## 2016-06-04 DIAGNOSIS — E538 Deficiency of other specified B group vitamins: Secondary | ICD-10-CM | POA: Diagnosis not present

## 2016-06-04 NOTE — Patient Instructions (Signed)
Follow up in one month.

## 2016-06-04 NOTE — Progress Notes (Addendum)
Pt arrived for a b12 injection. Pt received 1031mcg/1mL injection in L deltoid under asceptic technique. Pt tolerated procedure well. Band aid applied. Pt knows to return in 71 month.

## 2016-06-04 NOTE — Progress Notes (Signed)
Pt arrived for a b12 injection. Pt received 1075mcg/1mL injection in L deltoid under asceptic technique. Pt tolerated procedure well. Band aid applied. Pt knows to return in 35 month.

## 2016-06-07 ENCOUNTER — Encounter: Payer: Self-pay | Admitting: Cardiology

## 2016-06-24 ENCOUNTER — Ambulatory Visit: Payer: Medicare Other | Admitting: Cardiology

## 2016-07-01 ENCOUNTER — Other Ambulatory Visit: Payer: Medicare Other

## 2016-07-03 ENCOUNTER — Other Ambulatory Visit (INDEPENDENT_AMBULATORY_CARE_PROVIDER_SITE_OTHER): Payer: Medicare Other

## 2016-07-03 DIAGNOSIS — E039 Hypothyroidism, unspecified: Secondary | ICD-10-CM

## 2016-07-03 DIAGNOSIS — Z794 Long term (current) use of insulin: Secondary | ICD-10-CM | POA: Diagnosis not present

## 2016-07-03 DIAGNOSIS — E1165 Type 2 diabetes mellitus with hyperglycemia: Secondary | ICD-10-CM

## 2016-07-03 LAB — HEMOGLOBIN A1C: HEMOGLOBIN A1C: 5.3 % (ref 4.6–6.5)

## 2016-07-03 LAB — COMPREHENSIVE METABOLIC PANEL
ALBUMIN: 3.7 g/dL (ref 3.5–5.2)
ALT: 14 U/L (ref 0–35)
AST: 15 U/L (ref 0–37)
Alkaline Phosphatase: 53 U/L (ref 39–117)
BUN: 21 mg/dL (ref 6–23)
CHLORIDE: 103 meq/L (ref 96–112)
CO2: 32 meq/L (ref 19–32)
Calcium: 8.8 mg/dL (ref 8.4–10.5)
Creatinine, Ser: 1.4 mg/dL — ABNORMAL HIGH (ref 0.40–1.20)
GFR: 37.87 mL/min — ABNORMAL LOW (ref >60.00)
Glucose, Bld: 66 mg/dL — ABNORMAL LOW (ref 70–99)
POTASSIUM: 5 meq/L (ref 3.5–5.1)
SODIUM: 139 meq/L (ref 135–145)
Total Bilirubin: 0.2 mg/dL (ref 0.2–1.2)
Total Protein: 6.7 g/dL (ref 6.0–8.3)

## 2016-07-03 LAB — TSH: TSH: 2.48 u[IU]/mL (ref 0.35–4.50)

## 2016-07-04 ENCOUNTER — Ambulatory Visit: Payer: Medicare Other | Admitting: Endocrinology

## 2016-07-04 ENCOUNTER — Ambulatory Visit (INDEPENDENT_AMBULATORY_CARE_PROVIDER_SITE_OTHER): Payer: Medicare Other

## 2016-07-04 ENCOUNTER — Ambulatory Visit (INDEPENDENT_AMBULATORY_CARE_PROVIDER_SITE_OTHER): Payer: Medicare Other | Admitting: Endocrinology

## 2016-07-04 ENCOUNTER — Encounter: Payer: Self-pay | Admitting: Endocrinology

## 2016-07-04 ENCOUNTER — Other Ambulatory Visit: Payer: Self-pay | Admitting: *Deleted

## 2016-07-04 VITALS — BP 108/54 | HR 91 | Temp 98.0°F | Resp 14 | Ht 64.0 in | Wt 158.0 lb

## 2016-07-04 DIAGNOSIS — Z794 Long term (current) use of insulin: Secondary | ICD-10-CM

## 2016-07-04 DIAGNOSIS — E538 Deficiency of other specified B group vitamins: Secondary | ICD-10-CM | POA: Diagnosis not present

## 2016-07-04 DIAGNOSIS — R55 Syncope and collapse: Secondary | ICD-10-CM | POA: Diagnosis not present

## 2016-07-04 DIAGNOSIS — E1165 Type 2 diabetes mellitus with hyperglycemia: Secondary | ICD-10-CM

## 2016-07-04 MED ORDER — GLUCOSE BLOOD VI STRP: ORAL_STRIP | 1 refills | Status: DC

## 2016-07-04 MED ORDER — INSULIN ASPART 100 UNIT/ML FLEXPEN: 5.0000 [IU] | PEN_INJECTOR | Freq: Three times a day (TID) | SUBCUTANEOUS | 1 refills | Status: DC

## 2016-07-04 MED ORDER — ONETOUCH DELICA LANCETS 33G MISC: 1 refills | Status: DC
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
## 2016-07-04 NOTE — Progress Notes (Signed)
Pt arrives for B 12 injection.Cyanocobalamin 1037mcg/1ml administered IM to L deltoid using aseptic technique. Pt tolerated well.Bandaid applied.

## 2016-07-04 NOTE — Patient Instructions (Signed)
Stop losartan  Check blood sugars on waking up  3x per week  Also check blood sugars about 2 hours after a meal and do this after different meals by rotation  Recommended blood sugar levels on waking up is 90-130 and about 2 hours after meal is 130-160  Please bring your blood sugar monitor to each visit, thank you

## 2016-07-09 ENCOUNTER — Ambulatory Visit (INDEPENDENT_AMBULATORY_CARE_PROVIDER_SITE_OTHER): Payer: Medicare Other | Admitting: Cardiology

## 2016-07-09 ENCOUNTER — Encounter: Payer: Self-pay | Admitting: Cardiology

## 2016-07-09 ENCOUNTER — Encounter (INDEPENDENT_AMBULATORY_CARE_PROVIDER_SITE_OTHER): Payer: Self-pay

## 2016-07-09 VITALS — BP 120/80 | HR 50 | Ht 62.0 in | Wt 160.0 lb

## 2016-07-09 DIAGNOSIS — R55 Syncope and collapse: Secondary | ICD-10-CM

## 2016-07-09 DIAGNOSIS — I480 Paroxysmal atrial fibrillation: Secondary | ICD-10-CM | POA: Diagnosis not present

## 2016-07-09 DIAGNOSIS — I10 Essential (primary) hypertension: Secondary | ICD-10-CM

## 2016-07-09 DIAGNOSIS — I951 Orthostatic hypotension: Secondary | ICD-10-CM

## 2016-07-09 DIAGNOSIS — Z7901 Long term (current) use of anticoagulants: Secondary | ICD-10-CM | POA: Diagnosis not present

## 2016-07-09 DIAGNOSIS — I429 Cardiomyopathy, unspecified: Secondary | ICD-10-CM

## 2016-07-09 MED ORDER — METOPROLOL SUCCINATE ER 25 MG PO TB24: 25.0000 mg | ORAL_TABLET | Freq: Every day | ORAL | 3 refills | Status: DC

## 2016-07-09 MED ORDER — METOPROLOL SUCCINATE ER 25 MG PO TB24: 25.0000 mg | ORAL_TABLET | Freq: Every day | ORAL | 0 refills | Status: DC

## 2016-07-09 NOTE — Patient Instructions (Signed)
Medication Instructions:  Please stop your Losartan.  Decrease Metoprolol to 25 mg a day. Continue all other medications as listed.  Follow-Up: Follow up in 6 months with [name].  You will receive a letter in the mail 2 months before you are due.  Please call us when you receive this letter to schedule your follow up appointment.  If you need a refill on your cardiac medications before your next appointment, please call your pharmacy.  Thank you for choosing [facility]!!
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
## 2016-07-22 ENCOUNTER — Other Ambulatory Visit: Payer: Self-pay | Admitting: *Deleted

## 2016-07-22 MED ORDER — INSULIN PEN NEEDLE 32G X 4 MM MISC: 1 refills | Status: DC

## 2016-07-23 ENCOUNTER — Other Ambulatory Visit: Payer: Self-pay | Admitting: *Deleted

## 2016-07-23 MED ORDER — INSULIN PEN NEEDLE 32G X 4 MM MISC: 1 refills | Status: DC

## 2016-07-23 MED ORDER — ONETOUCH DELICA LANCETS 33G MISC: 1 refills | Status: DC

## 2016-07-30 ENCOUNTER — Telehealth: Payer: Self-pay | Admitting: Endocrinology

## 2016-07-30 ENCOUNTER — Other Ambulatory Visit: Payer: Self-pay | Admitting: *Deleted

## 2016-07-30 NOTE — Telephone Encounter (Signed)
She was switched to one touch Verio strips, rx was sent

## 2016-07-30 NOTE — Telephone Encounter (Signed)
Michelle from [facility] has request  for a One touch ultra blue test strips for this patient. Phone # [phone] Ref# [id number]

## 2016-08-05 ENCOUNTER — Other Ambulatory Visit: Payer: Self-pay | Admitting: Cardiology

## 2016-08-05 ENCOUNTER — Ambulatory Visit (INDEPENDENT_AMBULATORY_CARE_PROVIDER_SITE_OTHER): Payer: Medicare Other | Admitting: *Deleted

## 2016-08-05 DIAGNOSIS — E538 Deficiency of other specified B group vitamins: Secondary | ICD-10-CM

## 2016-08-05 NOTE — Progress Notes (Signed)
Gave Vitamin B12 IM in left deltoid. Cleaned with alcohol wipe prior to injection. Band-aid applied. Pt tolerated well.

## 2016-08-06 ENCOUNTER — Other Ambulatory Visit: Payer: Self-pay

## 2016-08-06 MED ORDER — GLUCOSE BLOOD VI STRP: ORAL_STRIP | 1 refills | Status: DC

## 2016-08-06 NOTE — Telephone Encounter (Signed)
Lattie Haw already faxed this prescription to them

## 2016-08-06 NOTE — Telephone Encounter (Signed)
[facility] called again and said they have not received the prescription for the strips. Please call.

## 2016-08-14 DIAGNOSIS — E119 Type 2 diabetes mellitus without complications: Secondary | ICD-10-CM | POA: Diagnosis not present

## 2016-08-14 LAB — HM DIABETES EYE EXAM

## 2016-08-22 ENCOUNTER — Encounter: Payer: Self-pay | Admitting: Endocrinology

## 2016-08-26 ENCOUNTER — Ambulatory Visit (INDEPENDENT_AMBULATORY_CARE_PROVIDER_SITE_OTHER): Payer: Medicare Other

## 2016-08-26 ENCOUNTER — Ambulatory Visit (INDEPENDENT_AMBULATORY_CARE_PROVIDER_SITE_OTHER): Payer: Medicare Other | Admitting: Physician Assistant

## 2016-08-26 VITALS — BP 126/66 | HR 98 | Temp 98.2°F | Resp 18 | Ht 62.0 in | Wt 156.4 lb

## 2016-08-26 DIAGNOSIS — R059 Cough, unspecified: Secondary | ICD-10-CM

## 2016-08-26 DIAGNOSIS — J019 Acute sinusitis, unspecified: Secondary | ICD-10-CM | POA: Diagnosis not present

## 2016-08-26 DIAGNOSIS — R0602 Shortness of breath: Secondary | ICD-10-CM | POA: Diagnosis not present

## 2016-08-26 DIAGNOSIS — R05 Cough: Secondary | ICD-10-CM

## 2016-08-26 MED ORDER — BENZONATATE 100 MG PO CAPS: 100.0000 mg | ORAL_CAPSULE | Freq: Three times a day (TID) | ORAL | 0 refills | Status: DC | PRN

## 2016-08-26 MED ORDER — AMOXICILLIN-POT CLAVULANATE 875-125 MG PO TABS: 1.0000 | ORAL_TABLET | Freq: Two times a day (BID) | ORAL | 0 refills | Status: AC

## 2016-08-26 NOTE — Progress Notes (Signed)
Patient ID: Melissa [name], female    DOB: 1928/07/26, 82 y.o.   MRN: [id number]  PCP: [name], MD  Chief Complaint  Patient presents with  . Cough    x 1 week,     Subjective:    HPI Presents for evaluation of cough. She is accompanied by her daughter.  Cough began 1 week ago, but has worsened x 3 days. Produces clear sputum and is associated with SOB, wheezing, sore throat, subjective chills, abdominal pain, nausea with one episode of vomiting, diarrhea.  Gets choked on the thick mucous.  OTC Delsym and Coricidin without benefit. Has had seasonal influenza vaccination.  Since GB was removed in 01/2016, she has had bilious emesis every 1-2 weeks. Followed by Dr. Watt Climes, most recently 05/2016.    Review of Systems Constitutional: Positive for activity change, chills, diaphoresis and fatigue. Negative for appetite change and fever.  HENT: Positive for congestion, postnasal drip, rhinorrhea, sinus pain, sneezing and sore throat. Negative for ear discharge, ear pain and sinus pressure.   Eyes: Negative for pain, discharge, redness and itching.  Respiratory: Positive for cough, choking, chest tightness, shortness of breath and wheezing.   Cardiovascular: Negative for chest pain and palpitations.  Gastrointestinal: Positive for abdominal pain, constipation, diarrhea, nausea and vomiting. Negative for blood in stool.  Genitourinary: Negative for difficulty urinating, dysuria, frequency, hematuria and urgency.  Neurological: Positive for headaches. Negative for dizziness, syncope, weakness and light-headedness.      Patient Active Problem List   Diagnosis Date Noted  . Cough 08/26/2016  . Symptomatic cholelithiasis 01/24/2016  . 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 TIA (transient ischemic attack)   . Acute sinusitis 11/15/2015  . Chest pain with moderate risk for cardiac etiology   . Chest pain with moderate risk of acute coronary syndrome  11/14/2015  . Left upper extremity numbness   . Paresthesia 08/22/2015  . Diabetic peripheral neuropathy associated with type 2 diabetes mellitus ([place]) 08/22/2015  . Tremor, essential 08/22/2015  . Paroxysmal atrial fibrillation ([place]) 03/06/2015  . Chronic anticoagulation 03/06/2015  . Orthostatic hypotension 03/06/2015  . Essential hypertension 04/19/2014  . Chronic kidney disease, stage III (moderate) 04/19/2014  . Unspecified hypothyroidism 06/15/2013  . Anemia 06/10/2013  . Left ventricular dysfunction 09/26/2011  . Asthma exacerbation 09/23/2011  . Type 2 diabetes mellitus with renal manifestations ([place]) 09/22/2011  . HLD (hyperlipidemia) 09/22/2011     Prior to Admission medications   Medication Sig Start Date End Date Taking? Authorizing Provider  acetaminophen (TYLENOL) 500 MG tablet Take 1,000 mg by mouth every 6 (six) hours as needed for mild pain. Reported on 11/02/2015   Yes Historical Provider, MD  albuterol (PROVENTIL) (2.5 MG/3ML) 0.083% nebulizer solution Take 2.5 mg by nebulization 2 (two) times daily as needed for wheezing or shortness of breath.  10/02/15  Yes Historical Provider, MD  apixaban (ELIQUIS) 2.5 MG TABS tablet Take 1 tablet (2.5 mg total) by mouth 2 (two) times daily. 04/09/16  Yes [name], MD  atorvastatin (LIPITOR) 40 MG tablet Take 40 mg by mouth daily. 04/25/16  Yes Historical Provider, MD  cetirizine (ZYRTEC) 10 MG tablet Take 10 mg by mouth daily.   Yes Historical Provider, MD  Cholecalciferol (VITAMIN D3) 1000 UNITS CAPS Take 2,000 Units by mouth daily.    Yes Historical Provider, MD  cyanocobalamin (,VITAMIN B-12,) 1000 MCG/ML injection Inject 1,000 mcg into the muscle every 30 (thirty) days.   Yes Historical Provider,  MD  docusate sodium (COLACE) 50 MG capsule Take 1-2 capsules (50-100 mg total) by mouth daily as needed for mild constipation. 05/10/16  Yes Leonie Douglas, PA-C  Fe Fum-FePoly-Vit C-Vit B3 (INTEGRA) 62.5-62.5-40-3 MG CAPS Take  1 capsule by mouth daily. 04/16/15  Yes Historical Provider, MD  FLUoxetine (PROZAC) 20 MG capsule Take 20 mg by mouth daily.  12/24/12  Yes Historical Provider, MD  fluticasone (FLONASE) 50 MCG/ACT nasal spray Place 2 sprays into both nostrils daily. 11/16/15  Yes Delfina Redwood, MD  glucose blood (ONETOUCH VERIO) test strip Use as instructed to check blood sugar 4 times per day dx code E11.29 08/06/16  Yes [name], MD  insulin aspart (NOVOLOG FLEXPEN) 100 UNIT/ML FlexPen Inject 5 Units into the skin 3 (three) times daily with meals. 07/04/16  Yes [name], MD  Insulin Detemir (LEVEMIR FLEXPEN) 100 UNIT/ML Pen Inject 22 Units into the skin daily at 10 pm.   Yes Historical Provider, MD  Insulin Pen Needle (BD PEN NEEDLE NANO U/F) 32G X 4 MM MISC Use 5 per day to inject insulin and victoza 07/23/16  Yes [name], MD  levalbuterol Penne Lash) 0.63 MG/3ML nebulizer solution Take 0.63 mg by nebulization daily as needed for wheezing or shortness of breath.  09/28/14  Yes Historical Provider, MD  levothyroxine (SYNTHROID, LEVOTHROID) 50 MCG tablet TAKE 1 TABLET BY MOUTH EVERY DAY 06/23/13  Yes [name], MD  metoprolol succinate (TOPROL-XL) 25 MG 24 hr tablet Take 1 tablet (25 mg total) by mouth daily. 07/09/16  Yes [name], MD  nitrofurantoin, macrocrystal-monohydrate, (MACROBID) 100 MG capsule Take 100 mg by mouth daily.    Yes Historical Provider, MD  Omega-3 Fatty Acids (FISH OIL) 1000 MG CAPS Take 1,000 mg by mouth daily.   Yes Historical Provider, MD  omeprazole (PRILOSEC) 10 MG capsule Take 10 mg by mouth daily.   Yes Historical Provider, MD  ondansetron (ZOFRAN) 4 MG tablet Take 1 tablet (4 mg total) by mouth every 8 (eight) hours as needed for nausea or vomiting. 09/26/15  Yes Leandrew Koyanagi, MD  [facility] DELICA LANCETS 62I MISC Use to check blood sugar 4 times per day dx code E11.29 07/23/16  Yes [name], MD  pramipexole (MIRAPEX) 0.5 MG tablet Take 0.25 mg by mouth at bedtime.    Yes Historical Provider, MD  pregabalin (LYRICA) 50 MG capsule Take 1 capsule (50 mg total) by mouth 3 (three) times daily as needed (For pain(diabetes)). 02/05/16  Yes [name], MD  psyllium (METAMUCIL) 0.52 g capsule Start off with one a day, then increase to 2-3 capsules a day as needed for constipation 05/10/16  Yes Leonie Douglas, PA-C  traMADol (ULTRAM) 50 MG tablet Take 1-2 tablets (50-100 mg total) by mouth every 6 (six) hours as needed for moderate pain. 01/25/16  Yes Coralie Keens, MD  traZODone (DESYREL) 50 MG tablet Take 75 mg by mouth at bedtime.  08/10/15  Yes Historical Provider, MD  VICTOZA 18 MG/3ML SOPN INJECT 1.2MG  SUBCUTANEOUSLYDAILY (MAKE AN APPOINTMENT FOR FURTHER REFILLS) 02/26/16  Yes [name], MD     No Known Allergies     Objective:  Physical Exam  Constitutional: She is oriented to person, place, and time. She appears well-developed and well-nourished. She is active and cooperative. No distress.  BP 126/66 (BP Location: Right Arm, Patient Position: Sitting, Cuff Size: Small)   Pulse 98   Temp 98.2 F (36.8 C) (Oral)   Resp 18  Ht 5\' 2"  (1.575 m)   Wt 156 lb 6.4 oz (70.9 kg)   SpO2 96%   BMI 28.61 kg/m   HENT:  Head: Normocephalic and atraumatic.  Right Ear: Hearing normal.  Left Ear: Hearing normal.  Mouth/Throat: Oropharynx is clear and moist and mucous membranes are normal. She has dentures (fully compensated complete edentula).  Eyes: Conjunctivae are normal. No scleral icterus.  Neck: Normal range of motion. Neck supple. No thyromegaly present.  Cardiovascular: Normal rate, regular rhythm and normal heart sounds.   Pulses:      Radial pulses are 2+ on the right side, and 2+ on the left side.  Pulmonary/Chest: Effort normal and breath sounds normal.  Lymphadenopathy:       Head (right side): No tonsillar, no preauricular, no posterior auricular and no occipital adenopathy present.       Head (left side): No tonsillar, no preauricular, no  posterior auricular and no occipital adenopathy present.    She has no cervical adenopathy.       Right: No supraclavicular adenopathy present.       Left: No supraclavicular adenopathy present.  Neurological: She is alert and oriented to person, place, and time. No sensory deficit.  Skin: Skin is warm, dry and intact. No rash noted. No cyanosis or erythema. Nails show no clubbing.  Psychiatric: She has a normal mood and affect. Her speech is normal and behavior is normal.       Dg Chest 2 View  Result Date: 08/26/2016 CLINICAL DATA:  Cough, shortness of breath and wheezing for 1 week EXAM: CHEST  2 VIEW COMPARISON:  08/27/2015 FINDINGS: Heart and mediastinal contours are within normal limits. No focal opacities or effusions. No acute bony abnormality. IMPRESSION: No active cardiopulmonary disease. Electronically Signed   By: Rolm Baptise M.D.   On: 08/26/2016 12:25       Assessment & Plan:   1. Cough Likely due to drainage from the sinuses. - DG Chest 2 View; Future - benzonatate (TESSALON) 100 MG capsule; Take 1-2 capsules (100-200 mg total) by mouth 3 (three) times daily as needed for cough.  Dispense: 40 capsule; Refill: 0  2. Subacute sinusitis, unspecified location Supportive care.  Anticipatory guidance.  RTC if symptoms worsen/persist. - amoxicillin-clavulanate (AUGMENTIN) 875-125 MG tablet; Take 1 tablet by mouth 2 (two) times daily.  Dispense: 20 tablet; Refill: 0   Fara Chute, PA-C Physician Assistant-Certified [facility]

## 2016-08-26 NOTE — Progress Notes (Signed)
Subjective:    Patient ID: Melissa [name], female    DOB: Aug 28, 1928, 82 y.o.   MRN: [id number]    Chief Complaint  Patient presents with  . Cough    x 1 week,    HPI: Presents for cough she has had for 1 week which has been worsening over the past 3 days. Associated symptoms include clear sputum, SOB, wheezing, pain behind her eyes, subjective chills, sore throat, night sweats, abdominal pain, diarrhea, nausea, and 1 episode of bilious emesis. Endorses several choking episodes on her mucus this week but denies trouble swallowing, has been gargling salt water which has provided some relief. Diarrhea has only been occurring today, 3 episodes this morning, denies blood in stool. Denies chest pain, ear pain/discharge, eye pain/redness/icthing, fevers, sinus pain or pressure. Has used Coricidin and Delsym at home without much relief. She did receive the flu vaccine. Accompanied by daughter whom states she has had these symptoms on and off for the past 3 months, states sometimes it gets better but it never truly goes away.  Daughter also states she has had bilious emesis once every 1-2 weeks since her gallbladder was removed in April 2017. States they saw Dr. Watt Climes in August for follow-up where she was then diagnosed with diverticulitis, small ulcer and small hernia.    Review of Systems  Constitutional: Positive for activity change, chills, diaphoresis and fatigue. Negative for appetite change and fever.  HENT: Positive for congestion, postnasal drip, rhinorrhea, sinus pain, sneezing and sore throat. Negative for ear discharge, ear pain and sinus pressure.   Eyes: Negative for pain, discharge, redness and itching.  Respiratory: Positive for cough, choking, chest tightness, shortness of breath and wheezing.   Cardiovascular: Negative for chest pain and palpitations.  Gastrointestinal: Positive for abdominal pain, constipation, diarrhea, nausea and vomiting. Negative for blood in stool.    Genitourinary: Negative for difficulty urinating, dysuria, frequency, hematuria and urgency.  Neurological: Positive for headaches. Negative for dizziness, syncope, weakness and light-headedness.   No Known Allergies  Prior to Admission medications   Medication Sig Start Date End Date Taking? Authorizing Provider  acetaminophen (TYLENOL) 500 MG tablet Take 1,000 mg by mouth every 6 (six) hours as needed for mild pain. Reported on 11/02/2015   Yes Historical Provider, MD  albuterol (PROVENTIL) (2.5 MG/3ML) 0.083% nebulizer solution Take 2.5 mg by nebulization 2 (two) times daily as needed for wheezing or shortness of breath.  10/02/15  Yes Historical Provider, MD  apixaban (ELIQUIS) 2.5 MG TABS tablet Take 1 tablet (2.5 mg total) by mouth 2 (two) times daily. 04/09/16  Yes [name], MD  atorvastatin (LIPITOR) 40 MG tablet Take 40 mg by mouth daily. 04/25/16  Yes Historical Provider, MD  cetirizine (ZYRTEC) 10 MG tablet Take 10 mg by mouth daily.   Yes Historical Provider, MD  Cholecalciferol (VITAMIN D3) 1000 UNITS CAPS Take 2,000 Units by mouth daily.    Yes Historical Provider, MD  cyanocobalamin (,VITAMIN B-12,) 1000 MCG/ML injection Inject 1,000 mcg into the muscle every 30 (thirty) days.   Yes Historical Provider, MD  docusate sodium (COLACE) 50 MG capsule Take 1-2 capsules (50-100 mg total) by mouth daily as needed for mild constipation. 05/10/16  Yes Leonie Douglas, PA-C  Fe Fum-FePoly-Vit C-Vit B3 (INTEGRA) 62.5-62.5-40-3 MG CAPS Take 1 capsule by mouth daily. 04/16/15  Yes Historical Provider, MD  FLUoxetine (PROZAC) 20 MG capsule Take 20 mg by mouth daily.  12/24/12  Yes Historical Provider, MD  fluticasone (  FLONASE) 50 MCG/ACT nasal spray Place 2 sprays into both nostrils daily. 11/16/15  Yes Delfina Redwood, MD  glucose blood (ONETOUCH VERIO) test strip Use as instructed to check blood sugar 4 times per day dx code E11.29 08/06/16  Yes [name], MD  insulin aspart (NOVOLOG  FLEXPEN) 100 UNIT/ML FlexPen Inject 5 Units into the skin 3 (three) times daily with meals. 07/04/16  Yes [name], MD  Insulin Detemir (LEVEMIR FLEXPEN) 100 UNIT/ML Pen Inject 22 Units into the skin daily at 10 pm.   Yes Historical Provider, MD  Insulin Pen Needle (BD PEN NEEDLE NANO U/F) 32G X 4 MM MISC Use 5 per day to inject insulin and victoza 07/23/16  Yes [name], MD  levalbuterol Penne Lash) 0.63 MG/3ML nebulizer solution Take 0.63 mg by nebulization daily as needed for wheezing or shortness of breath.  09/28/14  Yes Historical Provider, MD  levothyroxine (SYNTHROID, LEVOTHROID) 50 MCG tablet TAKE 1 TABLET BY MOUTH EVERY DAY 06/23/13  Yes [name], MD  metoprolol succinate (TOPROL-XL) 25 MG 24 hr tablet Take 1 tablet (25 mg total) by mouth daily. 07/09/16  Yes [name], MD  nitrofurantoin, macrocrystal-monohydrate, (MACROBID) 100 MG capsule Take 100 mg by mouth daily.    Yes Historical Provider, MD  Omega-3 Fatty Acids (FISH OIL) 1000 MG CAPS Take 1,000 mg by mouth daily.   Yes Historical Provider, MD  omeprazole (PRILOSEC) 10 MG capsule Take 10 mg by mouth daily.   Yes Historical Provider, MD  ondansetron (ZOFRAN) 4 MG tablet Take 1 tablet (4 mg total) by mouth every 8 (eight) hours as needed for nausea or vomiting. 09/26/15  Yes Leandrew Koyanagi, MD  [facility] DELICA LANCETS 52D MISC Use to check blood sugar 4 times per day dx code E11.29 07/23/16  Yes [name], MD  pramipexole (MIRAPEX) 0.5 MG tablet Take 0.25 mg by mouth at bedtime.   Yes Historical Provider, MD  pregabalin (LYRICA) 50 MG capsule Take 1 capsule (50 mg total) by mouth 3 (three) times daily as needed (For pain(diabetes)). 02/05/16  Yes [name], MD  psyllium (METAMUCIL) 0.52 g capsule Start off with one a day, then increase to 2-3 capsules a day as needed for constipation 05/10/16  Yes Leonie Douglas, PA-C  traMADol (ULTRAM) 50 MG tablet Take 1-2 tablets (50-100 mg total) by mouth every 6 (six) hours as  needed for moderate pain. 01/25/16  Yes Coralie Keens, MD  traZODone (DESYREL) 50 MG tablet Take 75 mg by mouth at bedtime.  08/10/15  Yes Historical Provider, MD  VICTOZA 18 MG/3ML SOPN INJECT 1.2MG  SUBCUTANEOUSLYDAILY (MAKE AN APPOINTMENT FOR FURTHER REFILLS) 02/26/16  Yes [name], MD   Patient Active Problem List   Diagnosis Date Noted  . Cough 08/26/2016  . Symptomatic cholelithiasis 01/24/2016  . 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 TIA (transient ischemic attack)   . Acute sinusitis 11/15/2015  . Chest pain with moderate risk for cardiac etiology   . Chest pain with moderate risk of acute coronary syndrome 11/14/2015  . Left upper extremity numbness   . Paresthesia 08/22/2015  . Diabetic peripheral neuropathy associated with type 2 diabetes mellitus ([place]) 08/22/2015  . Tremor, essential 08/22/2015  . Paroxysmal atrial fibrillation ([place]) 03/06/2015  . Chronic anticoagulation 03/06/2015  . Orthostatic hypotension 03/06/2015  . Essential hypertension 04/19/2014  . Chronic kidney disease, stage III (moderate) 04/19/2014  . Unspecified hypothyroidism 06/15/2013  . Anemia 06/10/2013  . Left  ventricular dysfunction 09/26/2011  . Asthma exacerbation 09/23/2011  . Type 2 diabetes mellitus with renal manifestations ([place]) 09/22/2011  . HLD (hyperlipidemia) 09/22/2011      Objective: Blood pressure 126/66, pulse 98, temperature 98.2 F (36.8 C), temperature source Oral, resp. rate 18, height 5\' 2"  (1.575 m), weight 156 lb 6.4 oz (70.9 kg), SpO2 96 %.   Physical Exam  Constitutional: She is oriented to person, place, and time. She appears well-developed and well-nourished. No distress.  HENT:  Head: Normocephalic and atraumatic.  Right Ear: External ear normal. No drainage, swelling or tenderness. Tympanic membrane is not injected, not scarred, not perforated, not erythematous, not retracted and not bulging. No middle ear effusion.  Left Ear: External  ear normal. No drainage, swelling or tenderness. Tympanic membrane is not injected, not scarred, not perforated, not erythematous, not retracted and not bulging.  No middle ear effusion.  Nose: Mucosal edema and rhinorrhea present. No sinus tenderness, septal deviation or nasal septal hematoma. Right sinus exhibits no maxillary sinus tenderness and no frontal sinus tenderness. Left sinus exhibits no maxillary sinus tenderness.  Mouth/Throat: Uvula is midline and oropharynx is clear and moist. Mucous membranes are not pale, not dry and not cyanotic. No oral lesions. Normal dentition. No oropharyngeal exudate, posterior oropharyngeal edema, posterior oropharyngeal erythema or tonsillar abscesses.  Eyes: Conjunctivae are normal. Pupils are equal, round, and reactive to light. Right eye exhibits no discharge and no exudate. Left eye exhibits no discharge and no exudate. Right conjunctiva is not injected. Left conjunctiva is not injected. No scleral icterus. Right pupil is round and reactive. Left pupil is round and reactive. Pupils are equal.  Neck: Normal range of motion. Neck supple. No edema, no erythema and normal range of motion present. No thyromegaly present.  Cardiovascular: Normal rate, regular rhythm and intact distal pulses.  Exam reveals no gallop, no distant heart sounds and no friction rub.   No murmur heard. Pulses:      Radial pulses are 2+ on the right side, and 2+ on the left side.  Pulmonary/Chest: Effort normal and breath sounds normal. No stridor. No respiratory distress. She has no decreased breath sounds. She has no wheezes. She has no rhonchi. She has no rales.  Abdominal: There is generalized tenderness.  Lymphadenopathy:    She has no cervical adenopathy.  Neurological: She is alert and oriented to person, place, and time. GCS eye subscore is 4. GCS verbal subscore is 5. GCS motor subscore is 6.  Skin: No rash noted. She is not diaphoretic. No cyanosis. Nails show no clubbing.    Psychiatric: She has a normal mood and affect. Her speech is normal and behavior is normal.   Dg Chest 2 View  Result Date: 08/26/2016 CLINICAL DATA:  Cough, shortness of breath and wheezing for 1 week EXAM: CHEST  2 VIEW COMPARISON:  08/27/2015 FINDINGS: Heart and mediastinal contours are within normal limits. No focal opacities or effusions. No acute bony abnormality. IMPRESSION: No active cardiopulmonary disease. Electronically Signed   By: Rolm Baptise M.D.   On: 08/26/2016 12:25      Assessment & Plan:  1. Cough CXR without active cardiopulmonary disease. Instructed to use Tessalon perles as needed for cough. - DG Chest 2 View; Future - benzonatate (TESSALON) 100 MG capsule; Take 1-2 capsules (100-200 mg total) by mouth 3 (three) times daily as needed for cough.  Dispense: 40 capsule; Refill: 0  2. Subacute sinusitis, unspecified location Rx Augmentin for sinusitis. Advised to RTC if  symptoms are worsening or not improving with medication. - amoxicillin-clavulanate (AUGMENTIN) 875-125 MG tablet; Take 1 tablet by mouth 2 (two) times daily.  Dispense: 20 tablet; Refill: 0

## 2016-08-26 NOTE — Telephone Encounter (Signed)
Pt 's daughter Hilda Blades -[phone] called in to schedule mom an appt with Dr. Lorelei Pont. Informed that provider is accepting pt's and can get her established. She says that mom has a terrible cough and she would like to get her in soon due to mom's age and current health concern. Please advise, pt is not a current pt of office but has seen Dr. Lorelei Pont at urgent care. Should pt be seen by someone else in office sooner than provider has available? Or is provider able to work pt in sooner?   Please advise.

## 2016-08-26 NOTE — Telephone Encounter (Signed)
I am glad to see her if I have a spot.  I am not sure if I have anything this week - please check

## 2016-08-26 NOTE — Patient Instructions (Signed)
     IF you received an x-ray today, you will receive an invoice from [facility]. Please contact [facility] at [phone] with questions or concerns regarding your invoice.   IF you received labwork today, you will receive an invoice from [facility]. Please contact [facility] at [phone] with questions or concerns regarding your invoice.   Our billing staff will not be able to assist you with questions regarding bills from these companies.  You will be contacted with the lab results as soon as they are available. The fastest way to get your results is to activate your My Chart account. Instructions are located on the last page of this paperwork. If you have not heard from us regarding the results in 2 weeks, please contact this office.      

## 2016-08-27 ENCOUNTER — Ambulatory Visit: Payer: Medicare Other | Admitting: Cardiology

## 2016-08-30 NOTE — Telephone Encounter (Signed)
Called daughter to schedule mom to come in, unable to get through.

## 2016-09-06 ENCOUNTER — Other Ambulatory Visit: Payer: Self-pay | Admitting: Endocrinology

## 2016-09-09 ENCOUNTER — Ambulatory Visit (INDEPENDENT_AMBULATORY_CARE_PROVIDER_SITE_OTHER): Payer: Medicare Other

## 2016-09-09 DIAGNOSIS — E538 Deficiency of other specified B group vitamins: Secondary | ICD-10-CM | POA: Diagnosis not present

## 2016-09-09 NOTE — Progress Notes (Signed)
Pt arrives for B 12 injection.Cyanocobalamin 1056mcg/1ml administered IM to L deltoid using aseptic technique. Pt tolerated well.Bandaid applied.

## 2016-09-30 ENCOUNTER — Other Ambulatory Visit: Payer: Self-pay

## 2016-10-01 ENCOUNTER — Other Ambulatory Visit: Payer: Self-pay

## 2016-10-01 MED ORDER — INSULIN PEN NEEDLE 32G X 4 MM MISC: 1 refills | Status: DC

## 2016-10-09 ENCOUNTER — Ambulatory Visit (INDEPENDENT_AMBULATORY_CARE_PROVIDER_SITE_OTHER): Payer: Medicare Other | Admitting: Family Medicine

## 2016-10-09 ENCOUNTER — Encounter: Payer: Self-pay | Admitting: Family Medicine

## 2016-10-09 ENCOUNTER — Ambulatory Visit (INDEPENDENT_AMBULATORY_CARE_PROVIDER_SITE_OTHER): Payer: Medicare Other

## 2016-10-09 ENCOUNTER — Ambulatory Visit: Payer: Medicare Other

## 2016-10-09 VITALS — BP 127/63 | HR 92 | Temp 98.4°F | Resp 16 | Ht 62.0 in | Wt 155.6 lb

## 2016-10-09 DIAGNOSIS — R053 Chronic cough: Secondary | ICD-10-CM

## 2016-10-09 DIAGNOSIS — J4521 Mild intermittent asthma with (acute) exacerbation: Secondary | ICD-10-CM

## 2016-10-09 DIAGNOSIS — E538 Deficiency of other specified B group vitamins: Secondary | ICD-10-CM

## 2016-10-09 DIAGNOSIS — R05 Cough: Secondary | ICD-10-CM

## 2016-10-09 MED ORDER — ALBUTEROL SULFATE (2.5 MG/3ML) 0.083% IN NEBU
2.5000 mg | INHALATION_SOLUTION | Freq: Once | RESPIRATORY_TRACT | Status: AC
  Administered 2016-10-09: 2.5 mg via RESPIRATORY_TRACT

## 2016-10-09 MED ORDER — METHYLPREDNISOLONE SODIUM SUCC 125 MG IJ SOLR
125.0000 mg | Freq: Once | INTRAMUSCULAR | Status: AC
  Administered 2016-10-09: 125 mg via INTRAMUSCULAR

## 2016-10-09 MED ORDER — PREDNISONE 20 MG PO TABS: 60.0000 mg | ORAL_TABLET | Freq: Every day | ORAL | 0 refills | Status: AC

## 2016-10-09 MED ORDER — IPRATROPIUM BROMIDE 0.02 % IN SOLN
0.5000 mg | Freq: Once | RESPIRATORY_TRACT | Status: AC
  Administered 2016-10-09: 0.5 mg via RESPIRATORY_TRACT

## 2016-10-09 NOTE — Patient Instructions (Addendum)
IF you received an x-ray today, you will receive an invoice from [facility]. Please contact [facility] at [phone] with questions or concerns regarding your invoice.   IF you received labwork today, you will receive an invoice from [place]. Please contact [facility] at [phone] with questions or concerns regarding your invoice.   Our billing staff will not be able to assist you with questions regarding bills from these companies.  You will be contacted with the lab results as soon as they are available. The fastest way to get your results is to activate your My Chart account. Instructions are located on the last page of this paperwork. If you have not heard from [place] regarding the results in 2 weeks, please contact this office.      Asthma, Acute Bronchospasm Acute bronchospasm caused by asthma is also referred to as an asthma attack. Bronchospasm means your air passages become narrowed. The narrowing is caused by inflammation and tightening of the muscles in the air tubes (bronchi) in your lungs. This can make it hard to breathe or cause you to wheeze and cough. What are the causes? Possible triggers are:  Animal dander from the skin, hair, or feathers of animals.  Dust mites contained in house dust.  Cockroaches.  Pollen from trees or grass.  Mold.  Cigarette or tobacco smoke.  Air pollutants such as dust, household cleaners, hair sprays, aerosol sprays, paint fumes, strong chemicals, or strong odors.  Cold air or weather changes. Cold air may trigger inflammation. Winds increase molds and pollens in the air.  Strong emotions such as crying or laughing hard.  Stress.  Certain medicines such as aspirin or beta-blockers.  Sulfites in foods and drinks, such as dried fruits and wine.  Infections or inflammatory conditions, such as a flu, cold, or inflammation of the nasal membranes (rhinitis).  Gastroesophageal reflux disease (GERD). GERD is a  condition where stomach acid backs up into your esophagus.  Exercise or strenuous activity. What are the signs or symptoms?  Wheezing.  Excessive coughing, particularly at night.  Chest tightness.  Shortness of breath. How is this diagnosed? Your health care provider will ask you about your medical history and perform a physical exam. A chest X-ray or blood testing may be performed to look for other causes of your symptoms or other conditions that may have triggered your asthma attack. How is this treated? Treatment is aimed at reducing inflammation and opening up the airways in your lungs. Most asthma attacks are treated with inhaled medicines. These include quick relief or rescue medicines (such as bronchodilators) and controller medicines (such as inhaled corticosteroids). These medicines are sometimes given through an inhaler or a nebulizer. Systemic steroid medicine taken by mouth or given through an IV tube also can be used to reduce the inflammation when an attack is moderate or severe. Antibiotic medicines are only used if a bacterial infection is present. Follow these instructions at home:  Rest.  Drink plenty of liquids. This helps the mucus to remain thin and be easily coughed up. Only use caffeine in moderation and do not use alcohol until you have recovered from your illness.  Do not smoke. Avoid being exposed to secondhand smoke.  You play a critical role in keeping yourself in good health. Avoid exposure to things that cause you to wheeze or to have breathing problems.  Keep your medicines up-to-date and available. Carefully follow your health care provider's treatment plan.  Take your medicine exactly as prescribed.  When pollen or pollution is bad, keep windows closed and use an air conditioner or go to places with air conditioning.  Asthma requires careful medical care. See your health care provider for a follow-up as advised. If you are more than [redacted] weeks pregnant  and you were prescribed any new medicines, let your obstetrician know about the visit and how you are doing. Follow up with your health care provider as directed.  After you have recovered from your asthma attack, make an appointment with your outpatient doctor to talk about ways to reduce the likelihood of future attacks. If you do not have a doctor who manages your asthma, make an appointment with a primary care doctor to discuss your asthma. Get help right away if:  You are getting worse.  You have trouble breathing. If severe, call your local emergency services (911 in the [place]  You develop chest pain or discomfort.  You are vomiting.  You are not able to keep fluids down.  You are coughing up yellow, green, brown, or bloody sputum.  You have a fever and your symptoms suddenly get worse.  You have trouble swallowing. This information is not intended to replace advice given to you by your health care provider. Make sure you discuss any questions you have with your health care provider. Document Released: 01/15/2007 Document Revised: 03/13/2016 Document Reviewed: 04/07/2013 Elsevier Interactive Patient Education  2017 [employer].

## 2016-10-09 NOTE — Progress Notes (Signed)
Pt arrives for B 12 injection.Cyanocobalamin 1055mcg/1ml administered IM to L deltoid using aseptic technique. Pt tolerated well.Bandaid applied.
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
## 2016-10-15 ENCOUNTER — Other Ambulatory Visit: Payer: Medicare Other

## 2016-10-15 DIAGNOSIS — J45901 Unspecified asthma with (acute) exacerbation: Secondary | ICD-10-CM | POA: Diagnosis not present

## 2016-10-15 DIAGNOSIS — J189 Pneumonia, unspecified organism: Secondary | ICD-10-CM | POA: Diagnosis not present

## 2016-10-15 DIAGNOSIS — G4700 Insomnia, unspecified: Secondary | ICD-10-CM | POA: Diagnosis not present

## 2016-10-15 DIAGNOSIS — E611 Iron deficiency: Secondary | ICD-10-CM | POA: Diagnosis not present

## 2016-10-16 ENCOUNTER — Ambulatory Visit: Payer: Medicare Other | Admitting: Family Medicine

## 2016-10-17 ENCOUNTER — Ambulatory Visit: Payer: Medicare Other | Admitting: Endocrinology

## 2016-10-18 ENCOUNTER — Other Ambulatory Visit: Payer: Self-pay | Admitting: Endocrinology

## 2016-10-19 ENCOUNTER — Encounter (HOSPITAL_COMMUNITY): Payer: Self-pay | Admitting: Emergency Medicine

## 2016-10-19 ENCOUNTER — Inpatient Hospital Stay (HOSPITAL_COMMUNITY)
Admission: EM | Admit: 2016-10-19 | Discharge: 2016-10-23 | DRG: 202 | Disposition: A | Discharge status: Home / Self Care | Payer: Medicare Other | Attending: Internal Medicine | Admitting: Internal Medicine

## 2016-10-19 ENCOUNTER — Emergency Department (HOSPITAL_COMMUNITY): Payer: Medicare Other

## 2016-10-19 DIAGNOSIS — I5022 Chronic systolic (congestive) heart failure: Secondary | ICD-10-CM | POA: Diagnosis present

## 2016-10-19 DIAGNOSIS — D631 Anemia in chronic kidney disease: Secondary | ICD-10-CM | POA: Diagnosis present

## 2016-10-19 DIAGNOSIS — R05 Cough: Secondary | ICD-10-CM | POA: Diagnosis not present

## 2016-10-19 DIAGNOSIS — E860 Dehydration: Secondary | ICD-10-CM | POA: Diagnosis present

## 2016-10-19 DIAGNOSIS — R69 Illness, unspecified: Secondary | ICD-10-CM | POA: Diagnosis not present

## 2016-10-19 DIAGNOSIS — I130 Hypertensive heart and chronic kidney disease with heart failure and stage 1 through stage 4 chronic kidney disease, or unspecified chronic kidney disease: Secondary | ICD-10-CM | POA: Diagnosis not present

## 2016-10-19 DIAGNOSIS — I480 Paroxysmal atrial fibrillation: Secondary | ICD-10-CM | POA: Diagnosis present

## 2016-10-19 DIAGNOSIS — J111 Influenza due to unidentified influenza virus with other respiratory manifestations: Secondary | ICD-10-CM | POA: Diagnosis present

## 2016-10-19 DIAGNOSIS — Z794 Long term (current) use of insulin: Secondary | ICD-10-CM

## 2016-10-19 DIAGNOSIS — I10 Essential (primary) hypertension: Secondary | ICD-10-CM | POA: Diagnosis not present

## 2016-10-19 DIAGNOSIS — Z9842 Cataract extraction status, left eye: Secondary | ICD-10-CM

## 2016-10-19 DIAGNOSIS — K219 Gastro-esophageal reflux disease without esophagitis: Secondary | ICD-10-CM | POA: Diagnosis present

## 2016-10-19 DIAGNOSIS — E1122 Type 2 diabetes mellitus with diabetic chronic kidney disease: Secondary | ICD-10-CM | POA: Diagnosis present

## 2016-10-19 DIAGNOSIS — I5023 Acute on chronic systolic (congestive) heart failure: Secondary | ICD-10-CM | POA: Diagnosis present

## 2016-10-19 DIAGNOSIS — Z961 Presence of intraocular lens: Secondary | ICD-10-CM | POA: Diagnosis present

## 2016-10-19 DIAGNOSIS — R531 Weakness: Secondary | ICD-10-CM | POA: Diagnosis not present

## 2016-10-19 DIAGNOSIS — Z8249 Family history of ischemic heart disease and other diseases of the circulatory system: Secondary | ICD-10-CM

## 2016-10-19 DIAGNOSIS — J40 Bronchitis, not specified as acute or chronic: Principal | ICD-10-CM | POA: Diagnosis present

## 2016-10-19 DIAGNOSIS — N183 Chronic kidney disease, stage 3 unspecified: Secondary | ICD-10-CM | POA: Diagnosis present

## 2016-10-19 DIAGNOSIS — Z87891 Personal history of nicotine dependence: Secondary | ICD-10-CM

## 2016-10-19 DIAGNOSIS — J208 Acute bronchitis due to other specified organisms: Secondary | ICD-10-CM | POA: Diagnosis present

## 2016-10-19 DIAGNOSIS — Z79899 Other long term (current) drug therapy: Secondary | ICD-10-CM

## 2016-10-19 DIAGNOSIS — I959 Hypotension, unspecified: Secondary | ICD-10-CM | POA: Diagnosis present

## 2016-10-19 DIAGNOSIS — E039 Hypothyroidism, unspecified: Secondary | ICD-10-CM | POA: Diagnosis present

## 2016-10-19 DIAGNOSIS — R0600 Dyspnea, unspecified: Secondary | ICD-10-CM | POA: Diagnosis present

## 2016-10-19 DIAGNOSIS — F329 Major depressive disorder, single episode, unspecified: Secondary | ICD-10-CM | POA: Diagnosis present

## 2016-10-19 DIAGNOSIS — D649 Anemia, unspecified: Secondary | ICD-10-CM | POA: Diagnosis present

## 2016-10-19 DIAGNOSIS — I951 Orthostatic hypotension: Secondary | ICD-10-CM | POA: Diagnosis present

## 2016-10-19 DIAGNOSIS — E1140 Type 2 diabetes mellitus with diabetic neuropathy, unspecified: Secondary | ICD-10-CM

## 2016-10-19 DIAGNOSIS — E1142 Type 2 diabetes mellitus with diabetic polyneuropathy: Secondary | ICD-10-CM | POA: Diagnosis present

## 2016-10-19 DIAGNOSIS — R0602 Shortness of breath: Secondary | ICD-10-CM | POA: Diagnosis not present

## 2016-10-19 DIAGNOSIS — Z9841 Cataract extraction status, right eye: Secondary | ICD-10-CM

## 2016-10-19 DIAGNOSIS — Z7901 Long term (current) use of anticoagulants: Secondary | ICD-10-CM

## 2016-10-19 DIAGNOSIS — R404 Transient alteration of awareness: Secondary | ICD-10-CM | POA: Diagnosis not present

## 2016-10-19 HISTORY — DX: Chronic systolic (congestive) heart failure: I50.22

## 2016-10-19 HISTORY — DX: Paroxysmal atrial fibrillation: I48.0

## 2016-10-19 HISTORY — DX: Chronic kidney disease, stage 3 unspecified: N18.30

## 2016-10-19 HISTORY — DX: Orthostatic hypotension: I95.1

## 2016-10-19 HISTORY — DX: Other cardiomyopathies: I42.8

## 2016-10-19 HISTORY — DX: Chronic kidney disease, stage 3 (moderate): N18.3

## 2016-10-19 LAB — CBC WITH DIFFERENTIAL/PLATELET
BASOS ABS: 0 10*3/uL (ref 0.0–0.1)
BASOS PCT: 0 %
Eosinophils Absolute: 0.1 10*3/uL (ref 0.0–0.7)
Eosinophils Relative: 2 %
HEMATOCRIT: 32.2 % — AB (ref 36.0–46.0)
Hemoglobin: 10.3 g/dL — ABNORMAL LOW (ref 12.0–15.0)
Lymphocytes Relative: 23 %
Lymphs Abs: 2 10*3/uL (ref 0.7–4.0)
MCH: 27.8 pg (ref 26.0–34.0)
MCHC: 32 g/dL (ref 30.0–36.0)
MCV: 87 fL (ref 78.0–100.0)
MONOS PCT: 8 %
Monocytes Absolute: 0.7 10*3/uL (ref 0.1–1.0)
NEUTROS ABS: 6.1 10*3/uL (ref 1.7–7.7)
NEUTROS PCT: 67 %
Platelets: 285 10*3/uL (ref 150–400)
RBC: 3.7 MIL/uL — AB (ref 3.87–5.11)
RDW: 14.5 % (ref 11.5–15.5)
WBC: 9 10*3/uL (ref 4.0–10.5)

## 2016-10-19 LAB — URINALYSIS, ROUTINE W REFLEX MICROSCOPIC
BILIRUBIN URINE: NEGATIVE
Glucose, UA: NEGATIVE mg/dL
Hgb urine dipstick: NEGATIVE
KETONES UR: NEGATIVE mg/dL
LEUKOCYTES UA: NEGATIVE
NITRITE: NEGATIVE
PROTEIN: NEGATIVE mg/dL
Specific Gravity, Urine: 1.03 (ref 1.005–1.030)
pH: 6 (ref 5.0–8.0)

## 2016-10-19 LAB — COMPREHENSIVE METABOLIC PANEL
ALK PHOS: 47 U/L (ref 38–126)
ALT: 14 U/L (ref 14–54)
ANION GAP: 8 (ref 5–15)
AST: 22 U/L (ref 15–41)
Albumin: 3.2 g/dL — ABNORMAL LOW (ref 3.5–5.0)
BUN: 17 mg/dL (ref 6–20)
CALCIUM: 8.5 mg/dL — AB (ref 8.9–10.3)
CHLORIDE: 102 mmol/L (ref 101–111)
CO2: 28 mmol/L (ref 22–32)
Creatinine, Ser: 1.1 mg/dL — ABNORMAL HIGH (ref 0.44–1.00)
GFR, EST AFRICAN AMERICAN: 51 mL/min — AB (ref >60)
GFR, EST NON AFRICAN AMERICAN: 44 mL/min — AB (ref >60)
Glucose, Bld: 159 mg/dL — ABNORMAL HIGH (ref 65–99)
Potassium: 3.5 mmol/L (ref 3.5–5.1)
SODIUM: 138 mmol/L (ref 135–145)
Total Bilirubin: 0.4 mg/dL (ref 0.3–1.2)
Total Protein: 6 g/dL — ABNORMAL LOW (ref 6.5–8.1)

## 2016-10-19 LAB — I-STAT CG4 LACTIC ACID, ED: LACTIC ACID, VENOUS: 1.31 mmol/L (ref 0.5–1.9)

## 2016-10-19 LAB — I-STAT TROPONIN, ED: TROPONIN I, POC: 0.01 ng/mL (ref 0.00–0.08)

## 2016-10-19 MED ORDER — METHYLPREDNISOLONE SODIUM SUCC 125 MG IJ SOLR
125.0000 mg | Freq: Once | INTRAMUSCULAR | Status: AC
  Administered 2016-10-19: 125 mg via INTRAVENOUS
  Filled 2016-10-19: qty 2

## 2016-10-19 MED ORDER — SODIUM CHLORIDE 0.9 % IV BOLUS (SEPSIS)
500.0000 mL | Freq: Once | INTRAVENOUS | Status: AC
  Administered 2016-10-19: 500 mL via INTRAVENOUS

## 2016-10-19 MED ORDER — ACETAMINOPHEN 325 MG PO TABS
650.0000 mg | ORAL_TABLET | Freq: Once | ORAL | Status: AC
  Administered 2016-10-19: 650 mg via ORAL
  Filled 2016-10-19: qty 2

## 2016-10-19 MED ORDER — IPRATROPIUM-ALBUTEROL 0.5-2.5 (3) MG/3ML IN SOLN
3.0000 mL | Freq: Once | RESPIRATORY_TRACT | Status: AC
  Administered 2016-10-19: 3 mL via RESPIRATORY_TRACT
  Filled 2016-10-19: qty 3

## 2016-10-19 MED ORDER — OSELTAMIVIR PHOSPHATE 30 MG PO CAPS
30.0000 mg | ORAL_CAPSULE | Freq: Two times a day (BID) | ORAL | Status: DC
  Administered 2016-10-20 – 2016-10-21 (×3): 30 mg via ORAL
  Filled 2016-10-19 (×6): qty 1
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
## 2016-10-19 NOTE — ED Notes (Signed)
Bed: WA22 Expected date:  Expected time:  Means of arrival:  Comments: EMS-weakness 
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
## 2016-10-19 NOTE — ED Notes (Signed)
Pt. Unsafe to stand for orthostatics.RN,Jake made ware.

## 2016-10-19 NOTE — ED Notes (Signed)
Pt. Attempted ambulating and pt. Became dizzy and redirected back to bed. Pt. Gait unsteady on her feet.RN,Rachel made aware.

## 2016-10-19 NOTE — ED Triage Notes (Signed)
Per EMS: Pt from home c/o "abnormal sensation/ felt sick" and generalized weakness. Reports family member was sick. Pt reports fever a few days ago. Denies NVD. Pt AOx4.   BP 174/92 Spo2 98% RA  RR 16 Pulse 98

## 2016-10-20 DIAGNOSIS — I10 Essential (primary) hypertension: Secondary | ICD-10-CM | POA: Diagnosis not present

## 2016-10-20 DIAGNOSIS — R0600 Dyspnea, unspecified: Secondary | ICD-10-CM | POA: Diagnosis not present

## 2016-10-20 DIAGNOSIS — E1140 Type 2 diabetes mellitus with diabetic neuropathy, unspecified: Secondary | ICD-10-CM | POA: Diagnosis not present

## 2016-10-20 DIAGNOSIS — Z794 Long term (current) use of insulin: Secondary | ICD-10-CM | POA: Diagnosis not present

## 2016-10-20 DIAGNOSIS — I5022 Chronic systolic (congestive) heart failure: Secondary | ICD-10-CM | POA: Diagnosis not present

## 2016-10-20 DIAGNOSIS — I480 Paroxysmal atrial fibrillation: Secondary | ICD-10-CM | POA: Diagnosis not present

## 2016-10-20 DIAGNOSIS — Z79899 Other long term (current) drug therapy: Secondary | ICD-10-CM | POA: Diagnosis not present

## 2016-10-20 DIAGNOSIS — Z9842 Cataract extraction status, left eye: Secondary | ICD-10-CM | POA: Diagnosis not present

## 2016-10-20 DIAGNOSIS — Z8249 Family history of ischemic heart disease and other diseases of the circulatory system: Secondary | ICD-10-CM | POA: Diagnosis not present

## 2016-10-20 DIAGNOSIS — D631 Anemia in chronic kidney disease: Secondary | ICD-10-CM | POA: Diagnosis not present

## 2016-10-20 DIAGNOSIS — E1142 Type 2 diabetes mellitus with diabetic polyneuropathy: Secondary | ICD-10-CM | POA: Diagnosis not present

## 2016-10-20 DIAGNOSIS — I130 Hypertensive heart and chronic kidney disease with heart failure and stage 1 through stage 4 chronic kidney disease, or unspecified chronic kidney disease: Secondary | ICD-10-CM | POA: Diagnosis not present

## 2016-10-20 DIAGNOSIS — K219 Gastro-esophageal reflux disease without esophagitis: Secondary | ICD-10-CM | POA: Diagnosis present

## 2016-10-20 DIAGNOSIS — R05 Cough: Secondary | ICD-10-CM | POA: Diagnosis not present

## 2016-10-20 DIAGNOSIS — E039 Hypothyroidism, unspecified: Secondary | ICD-10-CM | POA: Diagnosis not present

## 2016-10-20 DIAGNOSIS — I951 Orthostatic hypotension: Secondary | ICD-10-CM | POA: Diagnosis not present

## 2016-10-20 DIAGNOSIS — Z9841 Cataract extraction status, right eye: Secondary | ICD-10-CM | POA: Diagnosis not present

## 2016-10-20 DIAGNOSIS — Z961 Presence of intraocular lens: Secondary | ICD-10-CM | POA: Diagnosis present

## 2016-10-20 DIAGNOSIS — N183 Chronic kidney disease, stage 3 (moderate): Secondary | ICD-10-CM | POA: Diagnosis not present

## 2016-10-20 DIAGNOSIS — J111 Influenza due to unidentified influenza virus with other respiratory manifestations: Secondary | ICD-10-CM | POA: Diagnosis not present

## 2016-10-20 DIAGNOSIS — E1122 Type 2 diabetes mellitus with diabetic chronic kidney disease: Secondary | ICD-10-CM | POA: Diagnosis not present

## 2016-10-20 DIAGNOSIS — R69 Illness, unspecified: Secondary | ICD-10-CM | POA: Diagnosis not present

## 2016-10-20 DIAGNOSIS — Z7901 Long term (current) use of anticoagulants: Secondary | ICD-10-CM | POA: Diagnosis not present

## 2016-10-20 DIAGNOSIS — E860 Dehydration: Secondary | ICD-10-CM | POA: Diagnosis not present

## 2016-10-20 DIAGNOSIS — I5023 Acute on chronic systolic (congestive) heart failure: Secondary | ICD-10-CM | POA: Diagnosis not present

## 2016-10-20 DIAGNOSIS — Z87891 Personal history of nicotine dependence: Secondary | ICD-10-CM | POA: Diagnosis not present

## 2016-10-20 DIAGNOSIS — J40 Bronchitis, not specified as acute or chronic: Secondary | ICD-10-CM | POA: Diagnosis not present

## 2016-10-20 DIAGNOSIS — F329 Major depressive disorder, single episode, unspecified: Secondary | ICD-10-CM | POA: Diagnosis not present

## 2016-10-20 LAB — CBC
HEMATOCRIT: 32.6 % — AB (ref 36.0–46.0)
Hemoglobin: 10.7 g/dL — ABNORMAL LOW (ref 12.0–15.0)
MCH: 28.2 pg (ref 26.0–34.0)
MCHC: 32.8 g/dL (ref 30.0–36.0)
MCV: 85.8 fL (ref 78.0–100.0)
Platelets: 267 10*3/uL (ref 150–400)
RBC: 3.8 MIL/uL — ABNORMAL LOW (ref 3.87–5.11)
RDW: 14.6 % (ref 11.5–15.5)
WBC: 11 10*3/uL — ABNORMAL HIGH (ref 4.0–10.5)

## 2016-10-20 LAB — BASIC METABOLIC PANEL
Anion gap: 10 (ref 5–15)
BUN: 20 mg/dL (ref 6–20)
CO2: 26 mmol/L (ref 22–32)
Calcium: 8.5 mg/dL — ABNORMAL LOW (ref 8.9–10.3)
Chloride: 100 mmol/L — ABNORMAL LOW (ref 101–111)
Creatinine, Ser: 1.11 mg/dL — ABNORMAL HIGH (ref 0.44–1.00)
GFR calc Af Amer: 51 mL/min — ABNORMAL LOW (ref >60)
GFR, EST NON AFRICAN AMERICAN: 44 mL/min — AB (ref >60)
GLUCOSE: 252 mg/dL — AB (ref 65–99)
POTASSIUM: 3.9 mmol/L (ref 3.5–5.1)
Sodium: 136 mmol/L (ref 135–145)

## 2016-10-20 LAB — GLUCOSE, CAPILLARY
Glucose-Capillary: 177 mg/dL — ABNORMAL HIGH (ref 65–99)
Glucose-Capillary: 199 mg/dL — ABNORMAL HIGH (ref 65–99)
Glucose-Capillary: 203 mg/dL — ABNORMAL HIGH (ref 65–99)
Glucose-Capillary: 232 mg/dL — ABNORMAL HIGH (ref 65–99)
Glucose-Capillary: 245 mg/dL — ABNORMAL HIGH (ref 65–99)

## 2016-10-20 LAB — RESPIRATORY PANEL BY PCR
Adenovirus: NOT DETECTED
Bordetella pertussis: NOT DETECTED
CORONAVIRUS 229E-RVPPCR: NOT DETECTED
CORONAVIRUS HKU1-RVPPCR: NOT DETECTED
CORONAVIRUS NL63-RVPPCR: NOT DETECTED
CORONAVIRUS OC43-RVPPCR: NOT DETECTED
Chlamydophila pneumoniae: NOT DETECTED
Influenza A: NOT DETECTED
Influenza B: NOT DETECTED
METAPNEUMOVIRUS-RVPPCR: NOT DETECTED
Mycoplasma pneumoniae: NOT DETECTED
PARAINFLUENZA VIRUS 1-RVPPCR: NOT DETECTED
PARAINFLUENZA VIRUS 2-RVPPCR: NOT DETECTED
Parainfluenza Virus 3: NOT DETECTED
Parainfluenza Virus 4: NOT DETECTED
Respiratory Syncytial Virus: NOT DETECTED
Rhinovirus / Enterovirus: NOT DETECTED

## 2016-10-20 LAB — INFLUENZA PANEL BY PCR (TYPE A & B)
INFLAPCR: NEGATIVE
INFLBPCR: NEGATIVE

## 2016-10-20 LAB — BRAIN NATRIURETIC PEPTIDE: B Natriuretic Peptide: 176.9 pg/mL — ABNORMAL HIGH (ref 0.0–100.0)

## 2016-10-20 MED ORDER — FUROSEMIDE 10 MG/ML IJ SOLN
20.0000 mg | Freq: Once | INTRAMUSCULAR | Status: AC
Start: 2016-10-20 — End: 2016-10-20
  Administered 2016-10-20: 20 mg via INTRAVENOUS
  Filled 2016-10-20: qty 2

## 2016-10-20 MED ORDER — INSULIN DETEMIR 100 UNIT/ML ~~LOC~~ SOLN
22.0000 [IU] | Freq: Every day | SUBCUTANEOUS | Status: DC
  Administered 2016-10-20 – 2016-10-22 (×4): 22 [IU] via SUBCUTANEOUS
  Filled 2016-10-20 (×5): qty 0.22

## 2016-10-20 MED ORDER — ONDANSETRON HCL 4 MG PO TABS
4.0000 mg | ORAL_TABLET | Freq: Four times a day (QID) | ORAL | Status: DC | PRN
  Administered 2016-10-20 (×2): 4 mg via ORAL
  Filled 2016-10-20 (×2): qty 1

## 2016-10-20 MED ORDER — ALBUTEROL SULFATE (5 MG/ML) 0.5% IN NEBU: 2.5000 mg | INHALATION_SOLUTION | RESPIRATORY_TRACT | Status: DC | PRN

## 2016-10-20 MED ORDER — FLUOXETINE HCL 20 MG PO CAPS
20.0000 mg | ORAL_CAPSULE | Freq: Every day | ORAL | Status: DC
  Administered 2016-10-21 – 2016-10-23 (×3): 20 mg via ORAL
  Filled 2016-10-20 (×3): qty 1

## 2016-10-20 MED ORDER — ONDANSETRON HCL 4 MG/2ML IJ SOLN: 4.0000 mg | Freq: Four times a day (QID) | INTRAMUSCULAR | Status: DC | PRN

## 2016-10-20 MED ORDER — ALBUTEROL SULFATE (2.5 MG/3ML) 0.083% IN NEBU
2.5000 mg | INHALATION_SOLUTION | Freq: Three times a day (TID) | RESPIRATORY_TRACT | Status: DC
  Administered 2016-10-20: 2.5 mg via RESPIRATORY_TRACT
  Filled 2016-10-20: qty 3

## 2016-10-20 MED ORDER — ALBUTEROL SULFATE (5 MG/ML) 0.5% IN NEBU: 2.5000 mg | INHALATION_SOLUTION | Freq: Four times a day (QID) | RESPIRATORY_TRACT | Status: DC

## 2016-10-20 MED ORDER — METOPROLOL SUCCINATE ER 25 MG PO TB24
25.0000 mg | ORAL_TABLET | Freq: Every day | ORAL | Status: DC
  Administered 2016-10-20 – 2016-10-23 (×4): 25 mg via ORAL
  Filled 2016-10-20 (×4): qty 1

## 2016-10-20 MED ORDER — ACETAMINOPHEN 325 MG PO TABS
650.0000 mg | ORAL_TABLET | Freq: Four times a day (QID) | ORAL | Status: DC | PRN
  Administered 2016-10-20 – 2016-10-23 (×5): 650 mg via ORAL
  Filled 2016-10-20 (×6): qty 2

## 2016-10-20 MED ORDER — LEVOTHYROXINE SODIUM 50 MCG PO TABS
50.0000 ug | ORAL_TABLET | Freq: Every day | ORAL | Status: DC
  Administered 2016-10-20 – 2016-10-23 (×4): 50 ug via ORAL
  Filled 2016-10-20 (×4): qty 1

## 2016-10-20 MED ORDER — LEVALBUTEROL HCL 1.25 MG/0.5ML IN NEBU
1.2500 mg | INHALATION_SOLUTION | RESPIRATORY_TRACT | Status: DC | PRN
  Filled 2016-10-20: qty 0.5

## 2016-10-20 MED ORDER — LEVALBUTEROL HCL 1.25 MG/0.5ML IN NEBU
1.2500 mg | INHALATION_SOLUTION | Freq: Three times a day (TID) | RESPIRATORY_TRACT | Status: DC
  Administered 2016-10-21 (×2): 1.25 mg via RESPIRATORY_TRACT
  Filled 2016-10-20 (×4): qty 0.5

## 2016-10-20 MED ORDER — ALBUTEROL SULFATE (2.5 MG/3ML) 0.083% IN NEBU: 2.5000 mg | INHALATION_SOLUTION | RESPIRATORY_TRACT | Status: DC | PRN

## 2016-10-20 MED ORDER — INSULIN ASPART 100 UNIT/ML ~~LOC~~ SOLN
0.0000 [IU] | Freq: Three times a day (TID) | SUBCUTANEOUS | Status: DC
  Administered 2016-10-20: 5 [IU] via SUBCUTANEOUS
  Administered 2016-10-20: 3 [IU] via SUBCUTANEOUS
  Administered 2016-10-21: 2 [IU] via SUBCUTANEOUS
  Administered 2016-10-21: 3 [IU] via SUBCUTANEOUS
  Administered 2016-10-22: 2 [IU] via SUBCUTANEOUS
  Administered 2016-10-22: 3 [IU] via SUBCUTANEOUS
  Administered 2016-10-23 (×3): 2 [IU] via SUBCUTANEOUS
  Filled 2016-10-20: qty 1

## 2016-10-20 MED ORDER — LEVALBUTEROL HCL 1.25 MG/0.5ML IN NEBU
1.2500 mg | INHALATION_SOLUTION | Freq: Four times a day (QID) | RESPIRATORY_TRACT | Status: DC
  Administered 2016-10-20 (×2): 1.25 mg via RESPIRATORY_TRACT
  Filled 2016-10-20 (×2): qty 0.5

## 2016-10-20 MED ORDER — TRAZODONE HCL 50 MG PO TABS
100.0000 mg | ORAL_TABLET | Freq: Every day | ORAL | Status: DC
  Administered 2016-10-20 – 2016-10-22 (×3): 100 mg via ORAL
  Filled 2016-10-20 (×3): qty 2

## 2016-10-20 MED ORDER — INSULIN ASPART 100 UNIT/ML ~~LOC~~ SOLN
0.0000 [IU] | Freq: Every day | SUBCUTANEOUS | Status: DC
  Administered 2016-10-20: 2 [IU] via SUBCUTANEOUS

## 2016-10-20 MED ORDER — PRAMIPEXOLE DIHYDROCHLORIDE 0.25 MG PO TABS
0.5000 mg | ORAL_TABLET | Freq: Every day | ORAL | Status: DC
  Administered 2016-10-20 – 2016-10-22 (×3): 0.5 mg via ORAL
  Filled 2016-10-20 (×3): qty 2

## 2016-10-20 MED ORDER — PREGABALIN 50 MG PO CAPS
50.0000 mg | ORAL_CAPSULE | Freq: Three times a day (TID) | ORAL | Status: DC | PRN
  Administered 2016-10-20 – 2016-10-21 (×2): 50 mg via ORAL
  Filled 2016-10-20 (×2): qty 1

## 2016-10-20 MED ORDER — APIXABAN 2.5 MG PO TABS
2.5000 mg | ORAL_TABLET | Freq: Two times a day (BID) | ORAL | Status: DC
  Administered 2016-10-20 – 2016-10-23 (×8): 2.5 mg via ORAL
  Filled 2016-10-20 (×8): qty 1

## 2016-10-20 NOTE — Care Management Note (Signed)
Case Management Note  Patient Details  Name: Melissa [name] MRN: [id number] Date of Birth: 05/20/1928  Subjective/Objective:  Bronchitis, CHF              Action/Plan: Discharge Planning: NCM spoke to pt and grand-dtr at bedside. Pt lives at home with dtr, [name] and son-in-law. Has RW, bedside commode and neb machine at home. Will continue to follow for dc needs.   PCP  [name] MD  Expected Discharge Date:  10/22/16               Expected Discharge Plan:  Home/Self Care  In-House Referral:  NA  Discharge planning Services  CM Consult  Post Acute Care Choice:  NA Choice offered to:  NA  DME Arranged:  N/A DME Agency:  NA  HH Arranged:  NA HH Agency:  NA  Status of Service:  Completed, signed off  If discussed at [place] of Stay Meetings, dates discussed:    Additional Comments:  Erenest Rasher, RN 10/20/2016, 5:18 PM

## 2016-10-20 NOTE — Care Management Obs Status (Signed)
[place] NOTIFICATION   Patient Details  Name: Melissa [name] MRN: [id number] Date of Birth: 1928/05/21   Medicare Observation Status Notification Given:  Yes    Erenest Rasher, RN 10/20/2016, 5:17 PM

## 2016-10-20 NOTE — Progress Notes (Signed)
PROGRESS NOTE    Melissa [name]  PXT:[id number] DOB: 11/01/1927 DOA: 10/19/2016 PCP: [name], MD    Brief Narrative:  83 yo female with systolic heart failure and paroxysmal atrial fibrillation, presents with fever, cough and malaise. Persistent respiratory symptoms for the last 5 weeks, refractive to outpatient therapy, worsening for the last 4 days, cw fever and generalized weakness.  On initial presentation found with tachypnea, oxygen satuartion 99%, with decreased breath sounds. Admitted with bronchitis, possible viral.    Assessment & Plan:   Principal Problem:   Influenza-like illness Active Problems:   Type 2 diabetes mellitus with diabetic neuropathy, with long-term current use of insulin (HCC)   Normocytic anemia   Hypothyroidism, acquired   Essential hypertension   Chronic kidney disease, stage III (moderate)   Paroxysmal atrial fibrillation (HCC)   Chronic systolic CHF (congestive heart failure) ([place])   1. Bronchitis with possible bronchospastic component. Patient had a high dose of solumderol in the ED, will continue with aggressive bronchodilator therapy with levalbuterol, continue oxymetry monitoring and supplemental 02 per [place], to target 02 sat above 92%. Chest film personally reviewed noted no opacities, increased lung markings bilaterally. Continue on oseltamivir, follow on viral pannel.   2. Systolic heart failure EF 30-35% stable, chronic. Patient not on diuretic therapy, will start patient on low dose furosemide and follow on response. Possible pulmonary edema component to his symptoms.   3. Atrial fibrillation paroxysmal. Rate controlled, will continue anticoagulation, low dose furosemide to target negative fluid balance. Continue metoprolol for rate control, ekg personally reviewed noted sinus rhythm with PVC (monofocal).   4. CKD stage 3. Calculated GFR 45. Will follow on renal function after diuresis, K at 3,9 and Na at 136. Avoid hypotension or  nephrotoxic medications.    5. Hypothyroid. Continue levothyroxine.   6. Depression.  No agitation or confusion, will continue prozac and trazodone. Continue pramipexole.   7. T2DM. Will place patient on insulin sliding scale for glucose cover and monitoring. Capillary glucose 199-203-232.    DVT prophylaxis: enoxaban Code Status: full  Family Communication: no family at the bedside Disposition Plan: home   Consultants:     Procedures:    Antimicrobials  tamiflu    Subjective: Patient feeling better, persistent dry cough and dyspnea, no chest pain, no nausea or vomiting.   Objective: Vitals:   10/20/16 0017 10/20/16 0500 10/20/16 0700 10/20/16 0844  BP: (!) 147/68  (!) 157/94   Pulse: (!) 103  98   Resp: (!) 22  20   Temp: 98.5 F (36.9 C)  98.1 F (36.7 C)   TempSrc: Oral  Oral   SpO2: 98%  99% 98%  Weight:  74.9 kg (165 lb 2 oz)    Height:        Intake/Output Summary (Last 24 hours) at 10/20/16 1249 Last data filed at 10/20/16 1200  Gross per 24 hour  Intake              980 ml  Output                0 ml  Net              980 ml   Filed Weights   10/19/16 1842 10/20/16 0500  Weight: 70.3 kg (155 lb) 74.9 kg (165 lb 2 oz)    Examination:  General exam: deconditioned E ENT: mild pallor, oral mucosa moist.  Respiratory system: Decreased air move,ent, no rales or rhonchi, no significant wheezing.  Respiratory effort normal. Cardiovascular system: S1 & S2 heard, RRR. No JVD, murmurs, rubs, gallops or clicks. No pedal edema. Gastrointestinal system: Abdomen is nondistended, soft and nontender. No organomegaly or masses felt. Normal bowel sounds heard. Central nervous system: Alert and oriented. No focal neurological deficits. Extremities: Symmetric 5 x 5 power. Skin: No rashes, lesions or ulcers   Data Reviewed: I have personally reviewed following labs and imaging studies  CBC:  Recent Labs Lab 10/19/16 1850 10/20/16 0454  WBC 9.0 11.0*    NEUTROABS 6.1  --   HGB 10.3* 10.7*  HCT 32.2* 32.6*  MCV 87.0 85.8  PLT 285 007   Basic Metabolic Panel:  Recent Labs Lab 10/19/16 1850 10/20/16 0454  NA 138 136  K 3.5 3.9  CL 102 100*  CO2 28 26  GLUCOSE 159* 252*  BUN 17 20  CREATININE 1.10* 1.11*  CALCIUM 8.5* 8.5*   GFR: Estimated Creatinine Clearance: 34.5 mL/min (by C-G formula based on SCr of 1.11 mg/dL (H)). Liver Function Tests:  Recent Labs Lab 10/19/16 1850  AST 22  ALT 14  ALKPHOS 47  BILITOT 0.4  PROT 6.0*  ALBUMIN 3.2*   No results for input(s): LIPASE, AMYLASE in the last 168 hours. No results for input(s): AMMONIA in the last 168 hours. Coagulation Profile: No results for input(s): INR, PROTIME in the last 168 hours. Cardiac Enzymes: No results for input(s): CKTOTAL, CKMB, CKMBINDEX, TROPONINI in the last 168 hours. BNP (last 3 results) No results for input(s): PROBNP in the last 8760 hours. HbA1C: No results for input(s): HGBA1C in the last 72 hours. CBG:  Recent Labs Lab 10/20/16 0047 10/20/16 0834 10/20/16 1159  GLUCAP 199* 203* 232*   Lipid Profile: No results for input(s): CHOL, HDL, LDLCALC, TRIG, CHOLHDL, LDLDIRECT in the last 72 hours. Thyroid Function Tests: No results for input(s): TSH, T4TOTAL, FREET4, T3FREE, THYROIDAB in the last 72 hours. Anemia Panel: No results for input(s): VITAMINB12, FOLATE, FERRITIN, TIBC, IRON, RETICCTPCT in the last 72 hours. Sepsis Labs:  Recent Labs Lab 10/19/16 1902  LATICACIDVEN 1.31    No results found for this or any previous visit (from the past 240 hour(s)).       Radiology Studies: Dg Chest Port 1 View  Result Date: 10/19/2016 CLINICAL DATA:  Shortness of breath EXAM: PORTABLE CHEST 1 VIEW COMPARISON:  08/26/2016 FINDINGS: The right lung is grossly clear. Slight elevation of left diaphragm with minimal basilar atelectasis. No consolidation or effusion. Stable cardiomediastinal silhouette. No pneumothorax. IMPRESSION: Slight  elevation of left diaphragm with mild basilar atelectasis. No acute infiltrate or edema. Electronically Signed   By: Donavan Foil M.D.   On: 10/19/2016 19:00        Scheduled Meds: . albuterol  2.5 mg Nebulization TID  . apixaban  2.5 mg Oral BID  . insulin aspart  0-15 Units Subcutaneous TID WC  . insulin aspart  0-5 Units Subcutaneous QHS  . insulin detemir  22 Units Subcutaneous Q2200  . levothyroxine  50 mcg Oral Daily  . metoprolol succinate  25 mg Oral Daily  . oseltamivir  30 mg Oral BID   Continuous Infusions:   LOS: 0 days       Brylon Brenning Gerome Apley, MD [facility] Pager [phone]  If 7PM-7AM, please contact night-coverage [email or web address] Password [facility] 10/20/2016, 12:49 PM

## 2016-10-21 ENCOUNTER — Inpatient Hospital Stay (HOSPITAL_COMMUNITY): Payer: Medicare Other

## 2016-10-21 DIAGNOSIS — R0600 Dyspnea, unspecified: Secondary | ICD-10-CM

## 2016-10-21 LAB — GLUCOSE, CAPILLARY
GLUCOSE-CAPILLARY: 153 mg/dL — AB (ref 65–99)
GLUCOSE-CAPILLARY: 119 mg/dL — AB (ref 65–99)
Glucose-Capillary: 134 mg/dL — ABNORMAL HIGH (ref 65–99)

## 2016-10-21 LAB — ECHOCARDIOGRAM COMPLETE
HEIGHTINCHES: 62 in
WEIGHTICAEL: 2638.47 [oz_av]

## 2016-10-21 LAB — BASIC METABOLIC PANEL
ANION GAP: 9 (ref 5–15)
BUN: 30 mg/dL — ABNORMAL HIGH (ref 6–20)
CALCIUM: 8.4 mg/dL — AB (ref 8.9–10.3)
CO2: 29 mmol/L (ref 22–32)
CREATININE: 1.21 mg/dL — AB (ref 0.44–1.00)
Chloride: 99 mmol/L — ABNORMAL LOW (ref 101–111)
GFR, EST AFRICAN AMERICAN: 46 mL/min — AB (ref >60)
GFR, EST NON AFRICAN AMERICAN: 39 mL/min — AB (ref >60)
Glucose, Bld: 138 mg/dL — ABNORMAL HIGH (ref 65–99)
Potassium: 3.4 mmol/L — ABNORMAL LOW (ref 3.5–5.1)
SODIUM: 137 mmol/L (ref 135–145)

## 2016-10-21 MED ORDER — SIMETHICONE 80 MG PO CHEW
80.0000 mg | CHEWABLE_TABLET | Freq: Four times a day (QID) | ORAL | Status: DC | PRN
  Administered 2016-10-21: 80 mg via ORAL
  Filled 2016-10-21: qty 1

## 2016-10-21 MED ORDER — LEVALBUTEROL HCL 0.63 MG/3ML IN NEBU
INHALATION_SOLUTION | RESPIRATORY_TRACT | Status: AC
  Administered 2016-10-21: 08:00:00
  Filled 2016-10-21: qty 3

## 2016-10-21 NOTE — Progress Notes (Signed)
  Echocardiogram 2D Echocardiogram has been performed.  Melissa [name] 10/21/2016, 4:02 PM

## 2016-10-21 NOTE — Progress Notes (Signed)
Discharge planning, spoke with patient and spouse at beside. Chose [facility] for HH services, contacted [facility] for referral. [phone] 

## 2016-10-21 NOTE — Evaluation (Signed)
Physical Therapy Evaluation Patient Details Name: Melissa [name] MRN: [id number] DOB: 07-Jan-1928 Today's Date: 10/21/2016   History of Present Illness  Melissa [name] is a 83 y.o. female with a past medical history significant for IDDM, chronic systolic CHF, EF 84%, PAF, CKD 3,  HTN, and hypothyroidism who presents with several days worsening cough, malaise, weakness, sweats.   Clinical Impression  Pt admitted with above diagnosis. Pt currently with functional limitations due to the deficits listed below (see PT Problem List).  Pt will benefit from skilled PT to increase their independence and safety with mobility to allow discharge to the venue listed below.   Encouraged pt to mobilize with staff assist as much as tol today, also discussed [place] of cane or her walker until pt is back to baseline; she is significantly weaker than her normal state and will benefit from HHPT to allow return to IND     Follow Up Recommendations Home health PT;Supervision for mobility/OOB    Equipment Recommendations  None recommended by PT    Recommendations for Other Services       Precautions / Restrictions Precautions Precautions: Fall      Mobility  Bed Mobility               General bed mobility comments: NT--in chair  Transfers Overall transfer level: Needs assistance Equipment used: Rolling walker (2 wheeled) Transfers: Sit to/from Stand Sit to Stand: Min guard;Supervision         General transfer comment: for safety  Ambulation/Gait Ambulation/Gait assistance: Min assist;Min guard Ambulation Distance (Feet): 110 Feet Assistive device: None;1 person hand held assist Gait Pattern/deviations: Step-through pattern;Decreased stride length;Drifts right/left;Narrow base of support;Shuffle     General Gait Details: pt with very unsteady gait, improved with HHA; standing rest after 65' d/t incr WOb; sats 98%, HR 93  Stairs            Wheelchair Mobility    Modified Rankin  (Stroke Patients Only)       Balance Overall balance assessment: Needs assistance   Sitting balance-Leahy Scale: Good     Standing balance support: No upper extremity supported;Single extremity supported Standing balance-Leahy Scale: Fair               High level balance activites: Direction changes;Turns;Head turns High Level Balance Comments: requires  support to maintain balance during dynamic activities, fatigues quickly             Pertinent Vitals/Pain Pain Assessment: No/denies pain    Home Living Family/patient expects to be discharged to:: Private residence Living Arrangements: Children;Other relatives Available Help at Discharge: Family;Available PRN/intermittently Type of Home: House Home Access: Stairs to enter Entrance Stairs-Rails: Right;Left;Can reach both Entrance Stairs-Number of Steps: 4 Home Layout: Two level Home Equipment: Grab bars - tub/shower;Shower seat;Bedside commode;Walker - 2 wheels;Cane - single point Additional Comments: pt's son-in-law is usually at home with her but he is in the hospital --[facility]--currently    Prior Function Level of Independence: Independent         Comments: dtr confirms pt was IND prior to this illness     Hand Dominance        Extremity/Trunk Assessment   Upper Extremity Assessment Upper Extremity Assessment: Generalized weakness    Lower Extremity Assessment Lower Extremity Assessment: Generalized weakness       Communication   Communication: No difficulties  Cognition Arousal/Alertness: Awake/alert Behavior During Therapy: WFL for tasks assessed/performed Overall Cognitive Status: Within Functional Limits for tasks assessed  General Comments      Exercises     Assessment/Plan    PT Assessment Patient needs continued PT services  PT Problem List Decreased strength;Decreased activity tolerance;Decreased range of motion;Decreased mobility;Decreased knowledge  of use of DME          PT Treatment Interventions DME instruction;Gait training;Functional mobility training;Therapeutic activities;Therapeutic exercise;Patient/family education;Stair training    PT Goals (Current goals can be found in the Care Plan section)  Acute Rehab PT Goals Patient Stated Goal: to get better PT Goal Formulation: With patient Time For Goal Achievement: 10/28/16 Potential to Achieve Goals: Good    Frequency Min 3X/week   Barriers to discharge        Co-evaluation               End of Session Equipment Utilized During Treatment: Gait belt Activity Tolerance: Patient limited by fatigue Patient left: in chair;with call bell/phone within reach;with family/visitor present Nurse Communication: Mobility status         Time: 4650-3546 PT Time Calculation (min) (ACUTE ONLY): 18 min   Charges:   PT Evaluation $PT Eval Low Complexity: 1 Procedure     PT G Codes:        Karey Suthers 2016/11/08, 1:32 PM

## 2016-10-21 NOTE — Progress Notes (Signed)
PROGRESS NOTE    Melissa [name]  [id number]:[id number] DOB: 08/02/1928 DOA: 10/19/2016 PCP: [name], MD    Brief Narrative:  83 yo female with systolic heart failure and paroxysmal atrial fibrillation, presents with fever, cough and malaise. Persistent respiratory symptoms for the last 5 weeks, refractive to outpatient therapy, worsening for the last 4 days, cw fever and generalized weakness.  On initial presentation found with tachypnea, oxygen satuartion 99%, with decreased breath sounds. Admitted with bronchitis, possible viral.     Assessment & Plan:   Principal Problem:   Influenza-like illness Active Problems:   Type 2 diabetes mellitus with diabetic neuropathy, with long-term current use of insulin (HCC)   Normocytic anemia   Hypothyroidism, acquired   Essential hypertension   Chronic kidney disease, stage III (moderate)   Paroxysmal atrial fibrillation (HCC)   Chronic systolic CHF (congestive heart failure) (HCC)   Dyspnea   1. Bronchitis with possible bronchospastic component. On aggressive bronchodilator therapy with levalbuterol, continue oxymetry monitoring and supplemental 02 per [place], to target 02 sat above 92%.   2. Systolic heart failure EF 30-35% stable, chronic. Patient responded well to low dose furosemide, will recommend to discharge patient on as needed diuretic therapy, follow daily weight.  3. Atrial fibrillation paroxysmal. Continue rate controlled,  Anticoagulation with apixaban. Continue metoprolol for rate control.   4. CKD stage 3. Calculated GFR 45. Renal function with stable cr at 1,2 from 1,1. K at 3,4 and Na at 137, will follow on renal panel in am, hold on further diuresis.   5. Hypothyroid. Continue levothyroxine, clinically euthyroid.   6. Depression. Continue prozac and trazodone and pramipexole.   7. T2DM. IOn insulin sliding scale for glucose cover and monitoring. Capillary glucose 245, 153. 134 .    DVT prophylaxis:  enoxaban Code Status: full  Family Communication: no family at the bedside Disposition Plan: home   Consultants:     Procedures:    Antimicrobials  tamiflu   Subjective: Patient had furosemide yesterday with improvement of her symptoms, no chest pain or palpitations, no nausea or diarrhea. Positive general weakness, positive ambulatory dysfunction.   Objective: Vitals:   10/20/16 2147 10/21/16 0547 10/21/16 0742 10/21/16 0937  BP: (!) 149/72 131/63  136/73  Pulse: 99 87 84 97  Resp: 16 16 17    Temp: 97.4 F (36.3 C) 97.6 F (36.4 C)    TempSrc: Oral Oral    SpO2: 100% 94% 98%   Weight:  74.8 kg (164 lb 14.5 oz)    Height:        Intake/Output Summary (Last 24 hours) at 10/21/16 1041 Last data filed at 10/21/16 0910  Gross per 24 hour  Intake             1500 ml  Output                0 ml  Net             1500 ml   Filed Weights   10/19/16 1842 10/20/16 0500 10/21/16 0547  Weight: 70.3 kg (155 lb) 74.9 kg (165 lb 2 oz) 74.8 kg (164 lb 14.5 oz)    Examination:  General exam: deconditioned E ENT: no pallor or icterus, oral mucosa moist.  Respiratory system: Mild decreased at bases. Respiratory effort normal.No wheezing.  Cardiovascular system: S1 & S2 heard, RRR. No JVD, murmurs, rubs, gallops or clicks. Non pitting edema. Gastrointestinal system: Abdomen is nondistended, soft and nontender. No organomegaly or masses felt.  Normal bowel sounds heard. Central nervous system: Alert and oriented. No focal neurological deficits. Extremities: Symmetric 5 x 5 power. Skin: No rashes, lesions or ulcers    Data Reviewed: I have personally reviewed following labs and imaging studies  CBC:  Recent Labs Lab 10/19/16 1850 10/20/16 0454  WBC 9.0 11.0*  NEUTROABS 6.1  --   HGB 10.3* 10.7*  HCT 32.2* 32.6*  MCV 87.0 85.8  PLT 285 532   Basic Metabolic Panel:  Recent Labs Lab 10/19/16 1850 10/20/16 0454 10/21/16 0446  NA 138 136 137  K 3.5 3.9 3.4*   CL 102 100* 99*  CO2 28 26 29   GLUCOSE 159* 252* 138*  BUN 17 20 30*  CREATININE 1.10* 1.11* 1.21*  CALCIUM 8.5* 8.5* 8.4*   GFR: Estimated Creatinine Clearance: 31.6 mL/min (by C-G formula based on SCr of 1.21 mg/dL (H)). Liver Function Tests:  Recent Labs Lab 10/19/16 1850  AST 22  ALT 14  ALKPHOS 47  BILITOT 0.4  PROT 6.0*  ALBUMIN 3.2*   No results for input(s): LIPASE, AMYLASE in the last 168 hours. No results for input(s): AMMONIA in the last 168 hours. Coagulation Profile: No results for input(s): INR, PROTIME in the last 168 hours. Cardiac Enzymes: No results for input(s): CKTOTAL, CKMB, CKMBINDEX, TROPONINI in the last 168 hours. BNP (last 3 results) No results for input(s): PROBNP in the last 8760 hours. HbA1C: No results for input(s): HGBA1C in the last 72 hours. CBG:  Recent Labs Lab 10/20/16 0834 10/20/16 1159 10/20/16 1745 10/20/16 2128 10/21/16 0733  GLUCAP 203* 232* 177* 245* 153*   Lipid Profile: No results for input(s): CHOL, HDL, LDLCALC, TRIG, CHOLHDL, LDLDIRECT in the last 72 hours. Thyroid Function Tests: No results for input(s): TSH, T4TOTAL, FREET4, T3FREE, THYROIDAB in the last 72 hours. Anemia Panel: No results for input(s): VITAMINB12, FOLATE, FERRITIN, TIBC, IRON, RETICCTPCT in the last 72 hours. Sepsis Labs:  Recent Labs Lab 10/19/16 1902  LATICACIDVEN 1.31    Recent Results (from the past 240 hour(s))  Respiratory Panel by PCR     Status: None   Collection Time: 10/20/16  1:45 AM  Result Value Ref Range Status   Adenovirus NOT DETECTED NOT DETECTED Final   Coronavirus 229E NOT DETECTED NOT DETECTED Final   Coronavirus HKU1 NOT DETECTED NOT DETECTED Final   Coronavirus NL63 NOT DETECTED NOT DETECTED Final   Coronavirus OC43 NOT DETECTED NOT DETECTED Final   Metapneumovirus NOT DETECTED NOT DETECTED Final   Rhinovirus / Enterovirus NOT DETECTED NOT DETECTED Final   Influenza A NOT DETECTED NOT DETECTED Final   Influenza  B NOT DETECTED NOT DETECTED Final   Parainfluenza Virus 1 NOT DETECTED NOT DETECTED Final   Parainfluenza Virus 2 NOT DETECTED NOT DETECTED Final   Parainfluenza Virus 3 NOT DETECTED NOT DETECTED Final   Parainfluenza Virus 4 NOT DETECTED NOT DETECTED Final   Respiratory Syncytial Virus NOT DETECTED NOT DETECTED Final   Bordetella pertussis NOT DETECTED NOT DETECTED Final   Chlamydophila pneumoniae NOT DETECTED NOT DETECTED Final   Mycoplasma pneumoniae NOT DETECTED NOT DETECTED Final    Comment: Performed at [facility]         Radiology Studies: Dg Chest Port 1 View  Result Date: 10/19/2016 CLINICAL DATA:  Shortness of breath EXAM: PORTABLE CHEST 1 VIEW COMPARISON:  08/26/2016 FINDINGS: The right lung is grossly clear. Slight elevation of left diaphragm with minimal basilar atelectasis. No consolidation or effusion. Stable cardiomediastinal silhouette. No pneumothorax. IMPRESSION:  Slight elevation of left diaphragm with mild basilar atelectasis. No acute infiltrate or edema. Electronically Signed   By: Donavan Foil M.D.   On: 10/19/2016 19:00        Scheduled Meds: . apixaban  2.5 mg Oral BID  . FLUoxetine  20 mg Oral Daily  . insulin aspart  0-15 Units Subcutaneous TID WC  . insulin aspart  0-5 Units Subcutaneous QHS  . insulin detemir  22 Units Subcutaneous Q2200  . levalbuterol  1.25 mg Nebulization TID  . levothyroxine  50 mcg Oral Daily  . metoprolol succinate  25 mg Oral Daily  . oseltamivir  30 mg Oral BID  . pramipexole  0.5 mg Oral QHS  . traZODone  100 mg Oral QHS   Continuous Infusions:   LOS: 1 day       Seraj Dunnam Gerome Apley, MD [facility] Pager [phone]  If 7PM-7AM, please contact night-coverage [email or web address] Password [email or web address] 10/21/2016, 10:41 AM

## 2016-10-22 ENCOUNTER — Ambulatory Visit: Payer: Medicare Other | Admitting: Neurology

## 2016-10-22 LAB — GLUCOSE, CAPILLARY
GLUCOSE-CAPILLARY: 139 mg/dL — AB (ref 65–99)
GLUCOSE-CAPILLARY: 163 mg/dL — AB (ref 65–99)
Glucose-Capillary: 185 mg/dL — ABNORMAL HIGH (ref 65–99)
Glucose-Capillary: 151 mg/dL — ABNORMAL HIGH (ref 65–99)
Glucose-Capillary: 113 mg/dL — ABNORMAL HIGH (ref 65–99)

## 2016-10-22 LAB — BASIC METABOLIC PANEL
Anion gap: 6 (ref 5–15)
BUN: 28 mg/dL — AB (ref 6–20)
CHLORIDE: 103 mmol/L (ref 101–111)
CO2: 31 mmol/L (ref 22–32)
CREATININE: 1.25 mg/dL — AB (ref 0.44–1.00)
Calcium: 8.6 mg/dL — ABNORMAL LOW (ref 8.9–10.3)
GFR calc Af Amer: 44 mL/min — ABNORMAL LOW (ref >60)
GFR calc non Af Amer: 38 mL/min — ABNORMAL LOW (ref >60)
GLUCOSE: 127 mg/dL — AB (ref 65–99)
Potassium: 4.7 mmol/L (ref 3.5–5.1)
SODIUM: 140 mmol/L (ref 135–145)

## 2016-10-22 MED ORDER — OSELTAMIVIR PHOSPHATE 30 MG PO CAPS
30.0000 mg | ORAL_CAPSULE | Freq: Every day | ORAL | Status: DC
  Filled 2016-10-22: qty 1

## 2016-10-22 MED ORDER — LEVALBUTEROL HCL 1.25 MG/0.5ML IN NEBU
1.2500 mg | INHALATION_SOLUTION | Freq: Two times a day (BID) | RESPIRATORY_TRACT | Status: DC
  Administered 2016-10-22 – 2016-10-23 (×3): 1.25 mg via RESPIRATORY_TRACT
  Filled 2016-10-22 (×4): qty 0.5

## 2016-10-22 MED ORDER — PANTOPRAZOLE SODIUM 40 MG PO TBEC
40.0000 mg | DELAYED_RELEASE_TABLET | Freq: Every day | ORAL | Status: DC
  Administered 2016-10-22 – 2016-10-23 (×2): 40 mg via ORAL
  Filled 2016-10-22 (×2): qty 1

## 2016-10-22 NOTE — Progress Notes (Signed)
Physical Therapy Treatment Patient Details Name: Melissa [name] MRN: [id number] DOB: Jul 13, 1928 Today's Date: 10/22/2016    History of Present Illness Melissa [name] is a 83 y.o. female with a past medical history significant for IDDM, chronic systolic CHF, EF 83%, PAF, CKD 3,  HTN, and hypothyroidism who presents with several days worsening cough, malaise, weakness, sweats.     PT Comments    Pt with decr tolerance to activity/amb today and c/o incr SOB  As well as  L sided CP with amb(see gait for details), more unsteady, less conversant, and generally acting odd during mobility; HR tachy 116 to 48, BP 125/67; RN made aware of all the above  Follow Up Recommendations  Home health PT;Supervision for mobility/OOB     Equipment Recommendations  None recommended by PT    Recommendations for Other Services       Precautions / Restrictions Precautions Precautions: Fall Restrictions Weight Bearing Restrictions: No    Mobility  Bed Mobility               General bed mobility comments: NT--in chair  Transfers Overall transfer level: Needs assistance Equipment used: Rolling walker (2 wheeled) Transfers: Sit to/from Stand Sit to Stand: Min guard;Min assist         General transfer comment: cues for hand placement  Ambulation/Gait Ambulation/Gait assistance: Min assist Ambulation Distance (Feet): 30 Feet Assistive device: Rolling walker (2 wheeled) Gait Pattern/deviations: Decreased stride length;Shuffle     General Gait Details: fatigues rapidly, very unsteady even with support of RW--c/o severe SOB and L sided CP although does not appear extremely dyspneic, sats 99-100% on RA--RN made aware   Stairs            Wheelchair Mobility    Modified Rankin (Stroke Patients Only)       Balance             Standing balance-Leahy Scale: Poor                 High Level Balance Comments: requires support to maintain static balance today     Cognition Arousal/Alertness: Awake/alert Behavior During Therapy: WFL for tasks assessed/performed   Area of Impairment: Memory;Following commands     Memory: Decreased short-term memory Following Commands: Follows one step commands with increased time       General Comments: pt generally more fuzzy today, did not recall amb yesterday, pt less conversant during amb/closing eyes during amb  but denies dizziness oer other symptoms    Exercises      General Comments        Pertinent Vitals/Pain Pain Assessment: Faces Faces Pain Scale: Hurts little more Pain Location: head Pain Descriptors / Indicators: Aching;Heaviness Pain Intervention(s): Limited activity within patient's tolerance;Monitored during session    Home Living                      Prior Function            PT Goals (current goals can now be found in the care plan section) Acute Rehab PT Goals Patient Stated Goal: to get better PT Goal Formulation: With patient Time For Goal Achievement: 10/28/16 Potential to Achieve Goals: Good Progress towards PT goals: Not progressing toward goals - comment (see note)    Frequency    Min 3X/week      PT Plan Current plan remains appropriate    Co-evaluation  End of Session Equipment Utilized During Treatment: Gait belt Activity Tolerance: Patient limited by fatigue Patient left: in chair;with call bell/phone within reach;with family/visitor present     Time: 1001-1015 PT Time Calculation (min) (ACUTE ONLY): 14 min  Charges:  $Gait Training: 8-22 mins                    G Codes:      Ryosuke Ericksen Nov 13, 2016, 11:27 AM

## 2016-10-22 NOTE — Progress Notes (Addendum)
PROGRESS NOTE    Melissa [name]  GGE:[id number] DOB: 1928/08/27 DOA: 10/19/2016 PCP: [name], MD    Brief Narrative:  83 yo female with systolic heart failure and paroxysmal atrial fibrillation, presents with fever, cough and malaise. Persistent respiratory symptoms for the last 5 weeks, refractive to outpatient therapy, worsening for the last 4 days, cw fever and generalized weakness. On initial presentation found with tachypnea, oxygen satuartion 99%, with decreased breath sounds. Admitted with bronchitis, possible viral. Patient responded well to diuresis, echocardiogram with worsening LV systolic function. Cardiology has been consulted. Plan for discharge home with home health.    Assessment & Plan:   Principal Problem:   Influenza-like illness Active Problems:   Type 2 diabetes mellitus with diabetic neuropathy, with long-term current use of insulin (HCC)   Normocytic anemia   Hypothyroidism, acquired   Essential hypertension   Chronic kidney disease, stage III (moderate)   Paroxysmal atrial fibrillation (HCC)   Chronic systolic CHF (congestive heart failure) (HCC)   Dyspnea   1. Bronchitis with possible bronchospastic component.  Continue aggressive bronchodilator therapy with levalbuterol, oxymetry monitoring and supplemental 02 per [place] as needed, to target 02 sat above 92%. Improved oxygenation after diuresis with oxygen saturation at 99 % on room air. DC tamiflu.   2. Systolic heart failure decompensation.  Follow up echocardiography with reported EF 15 to 20%, down from 30 to 35%. Patient's symptoms improved with furosemide. Will continue to metoprolol for b blockade, will plan to start ace inh when GFR more stable. Patient will need standard dose of furosemide for symptoms control. I spoke with Cardiology Office, about new worsening systolic LV function. Patient will bee seen in the hospital as consultation. Stress test negative in 2012, treated as non ischemic  cardiomyopathy as outpatient.   3. Atrial fibrillation paroxysmal. Episodes of tachycardia, non sustained, will continue telemetry monitoring and   anticoagulation with apixaban. Continue metoprolol for rate control.   4. CKD stage 3. Calculated GFR 45. Renal function with stable cr at 1,2 from 1,1. K at 4,7 and Na at 140, will follow on renal panel in am, hold on further diuresis for now. Target negative fluid balance.   5. Hypothyroid. Continue levothyroxine, clinically euthyroid.   6. Depression. Continue prozac and trazodone and pramipexole. No confusion or agitation.   7. T2DM. Continue insulin sliding scale for glucose cover and monitoring. Capillary glucose 185-152-139.    DVT prophylaxis:enoxaban Code Status:full  Family Communication:no family at the bedside Disposition Plan:home   Consultants:    Procedures:   Antimicrobials      Subjective: Patient feeling better, dyspnea has improved, positive generalized weakness, no nausea or vomiting, tolerating po well. Responded well to diuresis, episodes of tachycardia.   Objective: Vitals:   10/22/16 0911 10/22/16 1335 10/22/16 1339 10/22/16 1345  BP: 137/80 (!) 137/109 90/61   Pulse: (!) 129 (!) 142 (!) 120 100  Resp:  16    Temp:  98 F (36.7 C)    TempSrc:  Oral    SpO2:  99%    Weight:      Height:        Intake/Output Summary (Last 24 hours) at 10/22/16 1504 Last data filed at 10/22/16 0700  Gross per 24 hour  Intake              600 ml  Output                0 ml  Net  600 ml   Filed Weights   10/20/16 0500 10/21/16 0547 10/22/16 0656  Weight: 74.9 kg (165 lb 2 oz) 74.8 kg (164 lb 14.5 oz) 71.7 kg (158 lb 1.6 oz)    Examination:  General exam: not in pain or dyspnea E ENT: no pallor or icterus. Oral mucosa moist.  Respiratory system: No wheezing, or rhonchi, mild rales at bases. Respiratory effort normal. Cardiovascular system: S1 & S2 heard, RRR. No JVD, murmurs,  rubs, gallops or clicks. No pedal edema. Gastrointestinal system: Abdomen is nondistended, soft and nontender. No organomegaly or masses felt. Normal bowel sounds heard. Central nervous system: Alert and oriented. No focal neurological deficits. Extremities: Symmetric 5 x 5 power. Skin: No rashes, lesions or ulcers    Data Reviewed: I have personally reviewed following labs and imaging studies  CBC:  Recent Labs Lab 10/19/16 1850 10/20/16 0454  WBC 9.0 11.0*  NEUTROABS 6.1  --   HGB 10.3* 10.7*  HCT 32.2* 32.6*  MCV 87.0 85.8  PLT 285 616   Basic Metabolic Panel:  Recent Labs Lab 10/19/16 1850 10/20/16 0454 10/21/16 0446 10/22/16 0515  NA 138 136 137 140  K 3.5 3.9 3.4* 4.7  CL 102 100* 99* 103  CO2 28 26 29 31   GLUCOSE 159* 252* 138* 127*  BUN 17 20 30* 28*  CREATININE 1.10* 1.11* 1.21* 1.25*  CALCIUM 8.5* 8.5* 8.4* 8.6*   GFR: Estimated Creatinine Clearance: 29.9 mL/min (by C-G formula based on SCr of 1.25 mg/dL (H)). Liver Function Tests:  Recent Labs Lab 10/19/16 1850  AST 22  ALT 14  ALKPHOS 47  BILITOT 0.4  PROT 6.0*  ALBUMIN 3.2*   No results for input(s): LIPASE, AMYLASE in the last 168 hours. No results for input(s): AMMONIA in the last 168 hours. Coagulation Profile: No results for input(s): INR, PROTIME in the last 168 hours. Cardiac Enzymes: No results for input(s): CKTOTAL, CKMB, CKMBINDEX, TROPONINI in the last 168 hours. BNP (last 3 results) No results for input(s): PROBNP in the last 8760 hours. HbA1C: No results for input(s): HGBA1C in the last 72 hours. CBG:  Recent Labs Lab 10/21/16 1200 10/21/16 1734 10/21/16 2203 10/22/16 0712 10/22/16 1209  GLUCAP 134* 119* 185* 151* 139*   Lipid Profile: No results for input(s): CHOL, HDL, LDLCALC, TRIG, CHOLHDL, LDLDIRECT in the last 72 hours. Thyroid Function Tests: No results for input(s): TSH, T4TOTAL, FREET4, T3FREE, THYROIDAB in the last 72 hours. Anemia Panel: No results for  input(s): VITAMINB12, FOLATE, FERRITIN, TIBC, IRON, RETICCTPCT in the last 72 hours. Sepsis Labs:  Recent Labs Lab 10/19/16 1902  LATICACIDVEN 1.31    Recent Results (from the past 240 hour(s))  Respiratory Panel by PCR     Status: None   Collection Time: 10/20/16  1:45 AM  Result Value Ref Range Status   Adenovirus NOT DETECTED NOT DETECTED Final   Coronavirus 229E NOT DETECTED NOT DETECTED Final   Coronavirus HKU1 NOT DETECTED NOT DETECTED Final   Coronavirus NL63 NOT DETECTED NOT DETECTED Final   Coronavirus OC43 NOT DETECTED NOT DETECTED Final   Metapneumovirus NOT DETECTED NOT DETECTED Final   Rhinovirus / Enterovirus NOT DETECTED NOT DETECTED Final   Influenza A NOT DETECTED NOT DETECTED Final   Influenza B NOT DETECTED NOT DETECTED Final   Parainfluenza Virus 1 NOT DETECTED NOT DETECTED Final   Parainfluenza Virus 2 NOT DETECTED NOT DETECTED Final   Parainfluenza Virus 3 NOT DETECTED NOT DETECTED Final   Parainfluenza Virus 4 NOT  DETECTED NOT DETECTED Final   Respiratory Syncytial Virus NOT DETECTED NOT DETECTED Final   Bordetella pertussis NOT DETECTED NOT DETECTED Final   Chlamydophila pneumoniae NOT DETECTED NOT DETECTED Final   Mycoplasma pneumoniae NOT DETECTED NOT DETECTED Final    Comment: Performed at [facility]         Radiology Studies: No results found.      Scheduled Meds: . apixaban  2.5 mg Oral BID  . FLUoxetine  20 mg Oral Daily  . insulin aspart  0-15 Units Subcutaneous TID WC  . insulin aspart  0-5 Units Subcutaneous QHS  . insulin detemir  22 Units Subcutaneous Q2200  . levalbuterol  1.25 mg Nebulization BID  . levothyroxine  50 mcg Oral Daily  . metoprolol succinate  25 mg Oral Daily  . [START ON 10/23/2016] oseltamivir  30 mg Oral Daily  . pramipexole  0.5 mg Oral QHS  . traZODone  100 mg Oral QHS   Continuous Infusions:   LOS: 2 days        Mauricio Gerome Apley, MD [facility] Pager [phone]  If  7PM-7AM, please contact night-coverage [email or web address] Password [email or web address] 10/22/2016, 3:04 PM

## 2016-10-23 ENCOUNTER — Encounter (HOSPITAL_COMMUNITY): Payer: Self-pay | Admitting: Physician Assistant

## 2016-10-23 ENCOUNTER — Other Ambulatory Visit: Payer: Self-pay

## 2016-10-23 DIAGNOSIS — I951 Orthostatic hypotension: Secondary | ICD-10-CM

## 2016-10-23 DIAGNOSIS — R69 Illness, unspecified: Secondary | ICD-10-CM

## 2016-10-23 DIAGNOSIS — J208 Acute bronchitis due to other specified organisms: Secondary | ICD-10-CM | POA: Diagnosis present

## 2016-10-23 DIAGNOSIS — E1140 Type 2 diabetes mellitus with diabetic neuropathy, unspecified: Secondary | ICD-10-CM

## 2016-10-23 DIAGNOSIS — I5023 Acute on chronic systolic (congestive) heart failure: Secondary | ICD-10-CM | POA: Diagnosis present

## 2016-10-23 DIAGNOSIS — Z794 Long term (current) use of insulin: Secondary | ICD-10-CM

## 2016-10-23 DIAGNOSIS — I480 Paroxysmal atrial fibrillation: Secondary | ICD-10-CM

## 2016-10-23 DIAGNOSIS — E039 Hypothyroidism, unspecified: Secondary | ICD-10-CM

## 2016-10-23 DIAGNOSIS — I10 Essential (primary) hypertension: Secondary | ICD-10-CM

## 2016-10-23 LAB — CBC WITH DIFFERENTIAL/PLATELET
Basophils Absolute: 0 10*3/uL (ref 0.0–0.1)
Basophils Relative: 0 %
Eosinophils Absolute: 0.2 10*3/uL (ref 0.0–0.7)
Eosinophils Relative: 2 %
HEMATOCRIT: 31.6 % — AB (ref 36.0–46.0)
HEMOGLOBIN: 10 g/dL — AB (ref 12.0–15.0)
LYMPHS ABS: 1.7 10*3/uL (ref 0.7–4.0)
Lymphocytes Relative: 20 %
MCH: 27.6 pg (ref 26.0–34.0)
MCHC: 31.6 g/dL (ref 30.0–36.0)
MCV: 87.3 fL (ref 78.0–100.0)
MONOS PCT: 10 %
Monocytes Absolute: 0.8 10*3/uL (ref 0.1–1.0)
NEUTROS ABS: 5.7 10*3/uL (ref 1.7–7.7)
NEUTROS PCT: 68 %
Platelets: 269 10*3/uL (ref 150–400)
RBC: 3.62 MIL/uL — AB (ref 3.87–5.11)
RDW: 15.1 % (ref 11.5–15.5)
WBC: 8.4 10*3/uL (ref 4.0–10.5)

## 2016-10-23 LAB — BASIC METABOLIC PANEL
ANION GAP: 6 (ref 5–15)
BUN: 24 mg/dL — ABNORMAL HIGH (ref 6–20)
CHLORIDE: 102 mmol/L (ref 101–111)
CO2: 28 mmol/L (ref 22–32)
Calcium: 8.9 mg/dL (ref 8.9–10.3)
Creatinine, Ser: 1.22 mg/dL — ABNORMAL HIGH (ref 0.44–1.00)
GFR calc non Af Amer: 39 mL/min — ABNORMAL LOW (ref >60)
GFR, EST AFRICAN AMERICAN: 45 mL/min — AB (ref >60)
Glucose, Bld: 177 mg/dL — ABNORMAL HIGH (ref 65–99)
POTASSIUM: 4.4 mmol/L (ref 3.5–5.1)
Sodium: 136 mmol/L (ref 135–145)

## 2016-10-23 LAB — GLUCOSE, CAPILLARY
Glucose-Capillary: 146 mg/dL — ABNORMAL HIGH (ref 65–99)
Glucose-Capillary: 145 mg/dL — ABNORMAL HIGH (ref 65–99)
Glucose-Capillary: 147 mg/dL — ABNORMAL HIGH (ref 65–99)

## 2016-10-23 LAB — MAGNESIUM: MAGNESIUM: 1.6 mg/dL — AB (ref 1.7–2.4)

## 2016-10-23 MED ORDER — LEVALBUTEROL HCL 1.25 MG/0.5ML IN NEBU: 1.2500 mg | INHALATION_SOLUTION | RESPIRATORY_TRACT | 3 refills | Status: DC | PRN

## 2016-10-23 MED ORDER — PREGABALIN 50 MG PO CAPS: 50.0000 mg | ORAL_CAPSULE | Freq: Three times a day (TID) | ORAL | 1 refills | Status: DC | PRN

## 2016-10-23 MED ORDER — FUROSEMIDE 20 MG PO TABS: 20.0000 mg | ORAL_TABLET | Freq: Every day | ORAL | 3 refills | Status: DC

## 2016-10-23 MED ORDER — MAGNESIUM SULFATE 4 GM/100ML IV SOLN
4.0000 g | Freq: Once | INTRAVENOUS | Status: AC
  Administered 2016-10-23: 4 g via INTRAVENOUS
  Filled 2016-10-23: qty 100

## 2016-10-23 MED ORDER — INSULIN DETEMIR 100 UNIT/ML FLEXPEN: PEN_INJECTOR | SUBCUTANEOUS | 2 refills | Status: DC
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
## 2016-10-23 NOTE — Discharge Summary (Signed)
Physician Discharge Summary  Melissa [name] VFI:[id number] DOB: 1927-11-07 DOA: 10/19/2016  PCP: [name], MD  Admit date: 10/19/2016 Discharge date: 10/23/2016  Time spent: 65 minutes  Recommendations for Outpatient Follow-up:  1. Follow up with [name], MD in 2 weeks. On follow-up patient will need a basic metabolic profile done as well as a magnesium level to follow-up on electrolytes and renal function. 2. Follow-up with Cecilie Kicks, NP on 10/31/2016 for hospital follow-up. On follow-up patient will need a basic metabolic profile done to follow-up on electrolytes and renal function. Patient's systolic heart failure on it to be reassessed at that time.   Discharge Diagnoses:  Principal Problem:   Acute on chronic systolic heart failure (HCC) Active Problems:   Viral bronchitis   Type 2 diabetes mellitus with diabetic neuropathy, with long-term current use of insulin (HCC)   Normocytic anemia   Hypothyroidism, acquired   Essential hypertension   Chronic kidney disease, stage III (moderate)   Paroxysmal atrial fibrillation (HCC)   Orthostatic hypotension   Influenza-like illness   Chronic systolic CHF (congestive heart failure) ([place])   Dyspnea   Discharge Condition: Stable and improved  Diet recommendation: Heart healthy  Filed Weights   10/22/16 0656 10/23/16 0500 10/23/16 0953  Weight: 71.7 kg (158 lb 1.6 oz) 61.4 kg (135 lb 4.8 oz) 69.7 kg (153 lb 10.6 oz)    History of present illness:  Per Dr. Marcene Corning is a 83 y.o. female with a past medical history significant for IDDM, chronic systolic CHF, EF 41%, PAF on apixaban, CKD 3, baseline creatinine 1.3, HTN, and hypothyroidism who presented with several days worsening cough, malaise, weakness, sweats.  Caveat that most history is collected from the patient's children, as the patient is too weak to provide full answers.  They report that about 5 weeks ago, the patient developed a new cough, was  diagnosed at urgent care with sinusitis, treated with Augmentin, had minimal improvement. Then about 10 days ago, she was seen again in urgent care, was complaining of persistent cough and wheezing now, was prescribed 7 days of prednisone 60 mg oral, which she took but had no improvement at all. So 4 days ago she saw her PCP at [facility] who diagnosed pneumonia based on lung exam (no CXR), prescribed a Zpak, which she has taken, again without improvement.  Now in the last 4 days, the patient has felt progressively worse. She has persistent cough productive of clear sputum, she has fever and chills at night, she has malaise, generalized weakness, to the point that on the day of admission, she could barely stand, as well as headache and aches all over worst in the back. There's been no leg swelling, orthopnea, change in exertional shortness of breath, paroxysmal nocturnal dyspnea. There are sick contacts (she lives with her daughter, and son-in-law was recently in the hospital for COPD flare). No documented flu exposures.  ED course: -Afebrile, heart rate 92, respirations 24, blood pressure 152/68, pulse oximetry normal on room air -Na 138, K 3.5, Cr 1.1 (baseline 1.3), WBC 9 K, Hgb 10.3 -Troponin negative -Lactic acid 1.3 -Urinalysis unremarkable -Chest x-ray with no focal opacities or edema -She was given Solu-Medrol and 500 mL normal saline and TRH was asked to evaluate for suspected COPD flare   Hospital Course:  #1 acute on chronic systolic heart failure Patient had presented with tachypnea, decreased breath sounds, nonproductive cough with concerns for viral bronchitis. Patient was initially given a dose of IV  steroids and place on bronchodilators. Patient was subsequently diuresed with IV Lasix with good diuresis and significant clinical improvement. Patient was maintained on home regimen of beta blocker. 2-D echo was obtained with a EF of 15-20% which had worsened from prior 2-D echo with a EF  of 30-35%. Patient improved symptomatically with Lasix. Patient also noted to have a negative stress test in 2012. Cardiology consultation was obtained and patient was seen in consultation by Dr. Ellyn Hack who felt patient's symptoms were likely multifactorial secondary to a probable viral respiratory infection in the setting of systolic heart failure. It was recommended that due to patient's improvement on diuresis patient be discharged home on a low-dose of Lasix 20 mg daily as opposed to an ACE inhibitor given the tendency for orthostasis. Was recommended that patient take Lasix 20 mg at lunchtime supposed to in the morning to help avoid orthostasis as well as daily weights. Patient's weight goes up more than 3 pounds patient to take 40 mg of Lasix and if patient was to feel dizzy to hold her Lasix. Close outpatient follow-up will be done for patient to follow-up with her cardiologist or advanced level practitioner. Patient will follow-up will likely need a basic metabolic profile done as well as a magnesium level checked to follow-up on electrolytes and renal function. Patient be discharged home in stable and improved condition.  #2 probable viral bronchitis Patient initially received a dose of IV Solu-Medrol in the ED and placed on aggressive bronchodilator therapy with Xopenex. Patient improved clinically with oxygenation after diuresis. Patient was initially placed on Tamiflu which was subsequently discontinued once influenza PCR came back negative. Patient be discharged home on 4-5 more days of scheduled Xopenex nebulizers. Outpatient follow-up.  #3 paroxysmal atrial fibrillation Patient was maintained on metoprolol for rate control. Patient was maintained on apixaban for anticoagulation.  #4 chronic kidneys disease stage III Stable.  #5 hypothyroidism Patient was maintained on home regimen of level thyroxine.  #6 depression Stable.  #7 type 2 diabetes Patient was maintained on home regimen  of Lantus as well as sliding scale insulin. Outpatient follow-up.  Procedures:  Chest x-ray 10/19/2016  2-D echo 10/21/2016  Consultations:  Cardiology: Dr. Ellyn Hack 10/23/2016  Discharge Exam: Vitals:   10/23/16 0648 10/23/16 1345  BP: (!) 143/74 (!) 126/48  Pulse: 91 90  Resp: 16 16  Temp:  98.3 F (36.8 C)    General: NAD Cardiovascular: RRR Respiratory: CTAB  Discharge Instructions   Discharge Instructions    Activity as tolerated - No restrictions    Complete by:  As directed    Diet - low sodium heart healthy    Complete by:  As directed    Discharge instructions    Complete by:  As directed    Wear support stockings. Daily weights, if weight increases by more than 3-5 pounds increase lasix dose to 40mg  daily. If you feel dizzy, then hold lasix dose.   Increase activity slowly    Complete by:  As directed      Current Discharge Medication List    START taking these medications   Details  furosemide (LASIX) 20 MG tablet Take 1 tablet (20 mg total) by mouth daily. Take daily at lunchtime. Qty: 30 tablet, Refills: 3    levalbuterol (XOPENEX) 1.25 MG/0.5ML nebulizer solution Take 1.25 mg by nebulization every 4 (four) hours as needed for wheezing or shortness of breath. Use 2 times daily x 5 days, then every 4 hours as needed. Qty: 30 each,  Refills: 3      CONTINUE these medications which have CHANGED   Details  Insulin Detemir (LEVEMIR FLEXTOUCH) 100 UNIT/ML Pen Inject 22 units at bedtime. Qty: 30 mL, Refills: 2      CONTINUE these medications which have NOT CHANGED   Details  acetaminophen (TYLENOL) 500 MG tablet Take 1,000 mg by mouth every 6 (six) hours as needed for mild pain. Reported on 11/02/2015    apixaban (ELIQUIS) 2.5 MG TABS tablet Take 1 tablet (2.5 mg total) by mouth 2 (two) times daily. Qty: 180 tablet, Refills: 3    atorvastatin (LIPITOR) 40 MG tablet Take 40 mg by mouth daily.    cetirizine (ZYRTEC) 10 MG tablet Take 10 mg by mouth  daily.    Cholecalciferol (VITAMIN D3) 1000 UNITS CAPS Take 2,000 Units by mouth daily.     cyanocobalamin (,VITAMIN B-12,) 1000 MCG/ML injection Inject 1,000 mcg into the muscle every 30 (thirty) days.    docusate sodium (COLACE) 50 MG capsule Take 1-2 capsules (50-100 mg total) by mouth daily as needed for mild constipation. Qty: 20 capsule, Refills: 0   Associated Diagnoses: Constipation, unspecified constipation type    Fe Fum-FePoly-Vit C-Vit B3 (INTEGRA) 62.5-62.5-40-3 MG CAPS Take 1 capsule by mouth daily. Refills: 3    FLUoxetine (PROZAC) 20 MG capsule Take 20 mg by mouth daily.     insulin aspart (NOVOLOG FLEXPEN) 100 UNIT/ML FlexPen Inject 5 Units into the skin 3 (three) times daily with meals. Qty: 15 mL, Refills: 1    levothyroxine (SYNTHROID, LEVOTHROID) 50 MCG tablet TAKE 1 TABLET BY MOUTH EVERY DAY Qty: 30 tablet, Refills: 2    liraglutide (VICTOZA) 18 MG/3ML SOPN 1.2 mg daily Qty: 6 pen, Refills: 1    metoprolol succinate (TOPROL-XL) 25 MG 24 hr tablet Take 1 tablet (25 mg total) by mouth daily. Qty: 90 tablet, Refills: 3    nitrofurantoin, macrocrystal-monohydrate, (MACROBID) 100 MG capsule Take 100 mg by mouth daily.     Omega-3 Fatty Acids (FISH OIL) 1000 MG CAPS Take 1,000 mg by mouth daily.    ondansetron (ZOFRAN) 4 MG tablet Take 1 tablet (4 mg total) by mouth every 8 (eight) hours as needed for nausea or vomiting. Qty: 20 tablet, Refills: 0    pantoprazole (PROTONIX) 40 MG tablet Take 40 mg by mouth daily. Refills: 5    pramipexole (MIRAPEX) 0.5 MG tablet Take 0.5 mg by mouth at bedtime.     traZODone (DESYREL) 100 MG tablet Take 100 mg by mouth at bedtime.    glucose blood (ONETOUCH VERIO) test strip Use as instructed to check blood sugar 4 times per day dx code E11.29 Qty: 400 each, Refills: 1    Insulin Pen Needle (BD PEN NEEDLE NANO U/F) 32G X 4 MM MISC Use 5 per day to inject insulin and victoza Qty: 450 each, Refills: 1    ONETOUCH DELICA  LANCETS 25E MISC Use to check blood sugar 4 times per day dx code E11.29 Qty: 400 each, Refills: 1    pregabalin (LYRICA) 50 MG capsule Take 1 capsule (50 mg total) by mouth 3 (three) times daily as needed (For pain(diabetes)). Qty: 270 capsule, Refills: 1      STOP taking these medications     albuterol (PROVENTIL) (2.5 MG/3ML) 0.083% nebulizer solution        No Known Allergies Follow-up Information    [facility] up.   Why:  physical therapy Contact information: [place]  [place], NP Follow up.   Specialties:  Cardiology, Radiology Why:  Your post-hospital follow-up will be on 10/31/16 at 9am with Cecilie Kicks. Mickel Baas is one of the nurse practitioners that works on [name]' care team. Contact information: [address] [place] [address] [phone]        [name], MD. Schedule an appointment as soon as possible for a visit today.   Specialty:  Family Medicine Contact information: [place] [address] [phone]            The results of significant diagnostics from this hospitalization (including imaging, microbiology, ancillary and laboratory) are listed below for reference.    Significant Diagnostic Studies: Dg Chest Port 1 View  Result Date: 10/19/2016 CLINICAL DATA:  Shortness of breath EXAM: PORTABLE CHEST 1 VIEW COMPARISON:  08/26/2016 FINDINGS: The right lung is grossly clear. Slight elevation of left diaphragm with minimal basilar atelectasis. No consolidation or effusion. Stable cardiomediastinal silhouette. No pneumothorax. IMPRESSION: Slight elevation of left diaphragm with mild basilar atelectasis. No acute infiltrate or edema. Electronically Signed   By: Donavan Foil M.D.   On: 10/19/2016 19:00    Microbiology: Recent Results (from the past 240 hour(s))  Respiratory Panel by PCR     Status: None    Collection Time: 10/20/16  1:45 AM  Result Value Ref Range Status   Adenovirus NOT DETECTED NOT DETECTED Final   Coronavirus 229E NOT DETECTED NOT DETECTED Final   Coronavirus HKU1 NOT DETECTED NOT DETECTED Final   Coronavirus NL63 NOT DETECTED NOT DETECTED Final   Coronavirus OC43 NOT DETECTED NOT DETECTED Final   Metapneumovirus NOT DETECTED NOT DETECTED Final   Rhinovirus / Enterovirus NOT DETECTED NOT DETECTED Final   Influenza A NOT DETECTED NOT DETECTED Final   Influenza B NOT DETECTED NOT DETECTED Final   Parainfluenza Virus 1 NOT DETECTED NOT DETECTED Final   Parainfluenza Virus 2 NOT DETECTED NOT DETECTED Final   Parainfluenza Virus 3 NOT DETECTED NOT DETECTED Final   Parainfluenza Virus 4 NOT DETECTED NOT DETECTED Final   Respiratory Syncytial Virus NOT DETECTED NOT DETECTED Final   Bordetella pertussis NOT DETECTED NOT DETECTED Final   Chlamydophila pneumoniae NOT DETECTED NOT DETECTED Final   Mycoplasma pneumoniae NOT DETECTED NOT DETECTED Final    Comment: Performed at [facility]: Basic Metabolic Panel:  Recent Labs Lab 10/19/16 1850 10/20/16 0454 10/21/16 0446 10/22/16 0515 10/23/16 0935  NA 138 136 137 140 136  K 3.5 3.9 3.4* 4.7 4.4  CL 102 100* 99* 103 102  CO2 28 26 29 31 28   GLUCOSE 159* 252* 138* 127* 177*  BUN 17 20 30* 28* 24*  CREATININE 1.10* 1.11* 1.21* 1.25* 1.22*  CALCIUM 8.5* 8.5* 8.4* 8.6* 8.9  MG  --   --   --   --  1.6*   Liver Function Tests:  Recent Labs Lab 10/19/16 1850  AST 22  ALT 14  ALKPHOS 47  BILITOT 0.4  PROT 6.0*  ALBUMIN 3.2*   No results for input(s): LIPASE, AMYLASE in the last 168 hours. No results for input(s): AMMONIA in the last 168 hours. CBC:  Recent Labs Lab 10/19/16 1850 10/20/16 0454 10/23/16 0935  WBC 9.0 11.0* 8.4  NEUTROABS 6.1  --  5.7  HGB 10.3* 10.7* 10.0*  HCT 32.2* 32.6* 31.6*  MCV 87.0 85.8 87.3  PLT  285 267 269   Cardiac Enzymes: No results for input(s): CKTOTAL,  CKMB, CKMBINDEX, TROPONINI in the last 168 hours. BNP: BNP (last 3 results)  Recent Labs  10/19/16 1906  BNP 176.9*    ProBNP (last 3 results) No results for input(s): PROBNP in the last 8760 hours.  CBG:  Recent Labs Lab 10/22/16 1209 10/22/16 1730 10/22/16 2148 10/23/16 0730 10/23/16 1159  GLUCAP 139* 113* 163* 146* 145*       Signed:  Kirubel Aja MD.  [facility] 10/23/2016, 5:16 PM

## 2016-10-23 NOTE — Progress Notes (Signed)
Pt to d/c home. AVS reviewed and "My Chart" discussed with pt. Pt capable of verbalizing medications, signs and symptoms of infection, and follow-up appointments. Remains hemodynamically stable. No signs and symptoms of distress. Educated pt to return to ER in the case of SOB, dizziness, or chest pain.  

## 2016-10-24 ENCOUNTER — Other Ambulatory Visit: Payer: Self-pay | Admitting: Physician Assistant

## 2016-10-24 ENCOUNTER — Telehealth: Payer: Self-pay

## 2016-10-24 DIAGNOSIS — R05 Cough: Secondary | ICD-10-CM

## 2016-10-24 DIAGNOSIS — R059 Cough, unspecified: Secondary | ICD-10-CM

## 2016-10-24 NOTE — Telephone Encounter (Signed)
Patient to be called per Melina Copa, PA for TCM follow up. Patient did not answer. Left message to call back.

## 2016-10-24 NOTE — Telephone Encounter (Signed)
   Patient contacted regarding discharge from [facility] on 10/23/16.  Patient understands to follow up with provider Cecilie Kicks, NP on 10/31/16 at 9:00 am at [address], [place], [place] [address] . Patient understands discharge instructions? Yes  Patient understands medications and regiment? Yes  Patient understands to bring all medications to this visit? Yes   Spoke with grandaughter [name] Overly (DPR on file).

## 2016-10-25 ENCOUNTER — Telehealth: Payer: Self-pay | Admitting: *Deleted

## 2016-10-25 NOTE — Telephone Encounter (Signed)
TCM Call was placed by triage RN on 10/24/16.

## 2016-10-25 NOTE — Telephone Encounter (Signed)
-----   Message from Charlie Pitter, [place] sent at 10/23/2016  4:56 PM EST ----- Regarding: TOC visit I scheduled Ms. Ezra for a visit on 10/31/16 with Cecilie Kicks NP - can you please make sure she gets the [facility] phone call? Likely going home today. Thanks! Dayna Dunn PA-C

## 2016-10-27 DIAGNOSIS — I482 Chronic atrial fibrillation: Secondary | ICD-10-CM | POA: Diagnosis not present

## 2016-10-27 DIAGNOSIS — K219 Gastro-esophageal reflux disease without esophagitis: Secondary | ICD-10-CM | POA: Diagnosis not present

## 2016-10-27 DIAGNOSIS — E1122 Type 2 diabetes mellitus with diabetic chronic kidney disease: Secondary | ICD-10-CM | POA: Diagnosis not present

## 2016-10-27 DIAGNOSIS — J45909 Unspecified asthma, uncomplicated: Secondary | ICD-10-CM | POA: Diagnosis not present

## 2016-10-27 DIAGNOSIS — I5023 Acute on chronic systolic (congestive) heart failure: Secondary | ICD-10-CM | POA: Diagnosis not present

## 2016-10-27 DIAGNOSIS — Z87891 Personal history of nicotine dependence: Secondary | ICD-10-CM | POA: Diagnosis not present

## 2016-10-27 DIAGNOSIS — I130 Hypertensive heart and chronic kidney disease with heart failure and stage 1 through stage 4 chronic kidney disease, or unspecified chronic kidney disease: Secondary | ICD-10-CM | POA: Diagnosis not present

## 2016-10-27 DIAGNOSIS — F419 Anxiety disorder, unspecified: Secondary | ICD-10-CM | POA: Diagnosis not present

## 2016-10-27 DIAGNOSIS — E1142 Type 2 diabetes mellitus with diabetic polyneuropathy: Secondary | ICD-10-CM | POA: Diagnosis not present

## 2016-10-27 DIAGNOSIS — E785 Hyperlipidemia, unspecified: Secondary | ICD-10-CM | POA: Diagnosis not present

## 2016-10-27 DIAGNOSIS — Z794 Long term (current) use of insulin: Secondary | ICD-10-CM | POA: Diagnosis not present

## 2016-10-27 DIAGNOSIS — N183 Chronic kidney disease, stage 3 (moderate): Secondary | ICD-10-CM | POA: Diagnosis not present

## 2016-10-27 DIAGNOSIS — D649 Anemia, unspecified: Secondary | ICD-10-CM | POA: Diagnosis not present

## 2016-10-27 DIAGNOSIS — I429 Cardiomyopathy, unspecified: Secondary | ICD-10-CM | POA: Diagnosis not present

## 2016-10-27 DIAGNOSIS — E039 Hypothyroidism, unspecified: Secondary | ICD-10-CM | POA: Diagnosis not present

## 2016-10-27 DIAGNOSIS — J209 Acute bronchitis, unspecified: Secondary | ICD-10-CM | POA: Diagnosis not present

## 2016-10-27 DIAGNOSIS — G250 Essential tremor: Secondary | ICD-10-CM | POA: Diagnosis not present

## 2016-10-28 DIAGNOSIS — J209 Acute bronchitis, unspecified: Secondary | ICD-10-CM | POA: Diagnosis not present

## 2016-10-28 DIAGNOSIS — N183 Chronic kidney disease, stage 3 (moderate): Secondary | ICD-10-CM | POA: Diagnosis not present

## 2016-10-28 DIAGNOSIS — I5023 Acute on chronic systolic (congestive) heart failure: Secondary | ICD-10-CM | POA: Diagnosis not present

## 2016-10-28 DIAGNOSIS — G250 Essential tremor: Secondary | ICD-10-CM | POA: Diagnosis not present

## 2016-10-28 DIAGNOSIS — I130 Hypertensive heart and chronic kidney disease with heart failure and stage 1 through stage 4 chronic kidney disease, or unspecified chronic kidney disease: Secondary | ICD-10-CM | POA: Diagnosis not present

## 2016-10-28 DIAGNOSIS — E1122 Type 2 diabetes mellitus with diabetic chronic kidney disease: Secondary | ICD-10-CM | POA: Diagnosis not present

## 2016-10-29 DIAGNOSIS — N183 Chronic kidney disease, stage 3 (moderate): Secondary | ICD-10-CM | POA: Diagnosis not present

## 2016-10-29 DIAGNOSIS — J209 Acute bronchitis, unspecified: Secondary | ICD-10-CM | POA: Diagnosis not present

## 2016-10-29 DIAGNOSIS — G250 Essential tremor: Secondary | ICD-10-CM | POA: Diagnosis not present

## 2016-10-29 DIAGNOSIS — I130 Hypertensive heart and chronic kidney disease with heart failure and stage 1 through stage 4 chronic kidney disease, or unspecified chronic kidney disease: Secondary | ICD-10-CM | POA: Diagnosis not present

## 2016-10-29 DIAGNOSIS — E1122 Type 2 diabetes mellitus with diabetic chronic kidney disease: Secondary | ICD-10-CM | POA: Diagnosis not present

## 2016-10-29 DIAGNOSIS — I5023 Acute on chronic systolic (congestive) heart failure: Secondary | ICD-10-CM | POA: Diagnosis not present

## 2016-10-31 ENCOUNTER — Ambulatory Visit: Payer: Medicare Other | Admitting: Cardiology

## 2016-10-31 DIAGNOSIS — I130 Hypertensive heart and chronic kidney disease with heart failure and stage 1 through stage 4 chronic kidney disease, or unspecified chronic kidney disease: Secondary | ICD-10-CM | POA: Diagnosis not present

## 2016-10-31 DIAGNOSIS — J209 Acute bronchitis, unspecified: Secondary | ICD-10-CM | POA: Diagnosis not present

## 2016-10-31 DIAGNOSIS — G250 Essential tremor: Secondary | ICD-10-CM | POA: Diagnosis not present

## 2016-10-31 DIAGNOSIS — I5023 Acute on chronic systolic (congestive) heart failure: Secondary | ICD-10-CM | POA: Diagnosis not present

## 2016-10-31 DIAGNOSIS — N183 Chronic kidney disease, stage 3 (moderate): Secondary | ICD-10-CM | POA: Diagnosis not present

## 2016-10-31 DIAGNOSIS — E1122 Type 2 diabetes mellitus with diabetic chronic kidney disease: Secondary | ICD-10-CM | POA: Diagnosis not present

## 2016-11-02 DIAGNOSIS — I130 Hypertensive heart and chronic kidney disease with heart failure and stage 1 through stage 4 chronic kidney disease, or unspecified chronic kidney disease: Secondary | ICD-10-CM | POA: Diagnosis not present

## 2016-11-02 DIAGNOSIS — G250 Essential tremor: Secondary | ICD-10-CM | POA: Diagnosis not present

## 2016-11-02 DIAGNOSIS — E1122 Type 2 diabetes mellitus with diabetic chronic kidney disease: Secondary | ICD-10-CM | POA: Diagnosis not present

## 2016-11-02 DIAGNOSIS — J209 Acute bronchitis, unspecified: Secondary | ICD-10-CM | POA: Diagnosis not present

## 2016-11-02 DIAGNOSIS — N183 Chronic kidney disease, stage 3 (moderate): Secondary | ICD-10-CM | POA: Diagnosis not present

## 2016-11-02 DIAGNOSIS — I5023 Acute on chronic systolic (congestive) heart failure: Secondary | ICD-10-CM | POA: Diagnosis not present

## 2016-11-04 DIAGNOSIS — E1122 Type 2 diabetes mellitus with diabetic chronic kidney disease: Secondary | ICD-10-CM | POA: Diagnosis not present

## 2016-11-04 DIAGNOSIS — G250 Essential tremor: Secondary | ICD-10-CM | POA: Diagnosis not present

## 2016-11-04 DIAGNOSIS — I5023 Acute on chronic systolic (congestive) heart failure: Secondary | ICD-10-CM | POA: Diagnosis not present

## 2016-11-04 DIAGNOSIS — I130 Hypertensive heart and chronic kidney disease with heart failure and stage 1 through stage 4 chronic kidney disease, or unspecified chronic kidney disease: Secondary | ICD-10-CM | POA: Diagnosis not present

## 2016-11-04 DIAGNOSIS — N183 Chronic kidney disease, stage 3 (moderate): Secondary | ICD-10-CM | POA: Diagnosis not present

## 2016-11-04 DIAGNOSIS — J209 Acute bronchitis, unspecified: Secondary | ICD-10-CM | POA: Diagnosis not present

## 2016-11-05 DIAGNOSIS — I509 Heart failure, unspecified: Secondary | ICD-10-CM | POA: Diagnosis not present

## 2016-11-05 DIAGNOSIS — E785 Hyperlipidemia, unspecified: Secondary | ICD-10-CM | POA: Diagnosis not present

## 2016-11-05 DIAGNOSIS — Z79899 Other long term (current) drug therapy: Secondary | ICD-10-CM | POA: Diagnosis not present

## 2016-11-05 DIAGNOSIS — R21 Rash and other nonspecific skin eruption: Secondary | ICD-10-CM | POA: Diagnosis not present

## 2016-11-06 DIAGNOSIS — I130 Hypertensive heart and chronic kidney disease with heart failure and stage 1 through stage 4 chronic kidney disease, or unspecified chronic kidney disease: Secondary | ICD-10-CM | POA: Diagnosis not present

## 2016-11-06 DIAGNOSIS — E1122 Type 2 diabetes mellitus with diabetic chronic kidney disease: Secondary | ICD-10-CM | POA: Diagnosis not present

## 2016-11-06 DIAGNOSIS — N183 Chronic kidney disease, stage 3 (moderate): Secondary | ICD-10-CM | POA: Diagnosis not present

## 2016-11-06 DIAGNOSIS — J209 Acute bronchitis, unspecified: Secondary | ICD-10-CM | POA: Diagnosis not present

## 2016-11-06 DIAGNOSIS — I5023 Acute on chronic systolic (congestive) heart failure: Secondary | ICD-10-CM | POA: Diagnosis not present

## 2016-11-06 DIAGNOSIS — G250 Essential tremor: Secondary | ICD-10-CM | POA: Diagnosis not present

## 2016-11-08 DIAGNOSIS — N183 Chronic kidney disease, stage 3 (moderate): Secondary | ICD-10-CM | POA: Diagnosis not present

## 2016-11-08 DIAGNOSIS — E1122 Type 2 diabetes mellitus with diabetic chronic kidney disease: Secondary | ICD-10-CM | POA: Diagnosis not present

## 2016-11-08 DIAGNOSIS — G250 Essential tremor: Secondary | ICD-10-CM | POA: Diagnosis not present

## 2016-11-08 DIAGNOSIS — I130 Hypertensive heart and chronic kidney disease with heart failure and stage 1 through stage 4 chronic kidney disease, or unspecified chronic kidney disease: Secondary | ICD-10-CM | POA: Diagnosis not present

## 2016-11-08 DIAGNOSIS — I5023 Acute on chronic systolic (congestive) heart failure: Secondary | ICD-10-CM | POA: Diagnosis not present

## 2016-11-08 DIAGNOSIS — J209 Acute bronchitis, unspecified: Secondary | ICD-10-CM | POA: Diagnosis not present

## 2016-11-11 ENCOUNTER — Ambulatory Visit (INDEPENDENT_AMBULATORY_CARE_PROVIDER_SITE_OTHER): Payer: Medicare Other

## 2016-11-11 DIAGNOSIS — N183 Chronic kidney disease, stage 3 (moderate): Secondary | ICD-10-CM | POA: Diagnosis not present

## 2016-11-11 DIAGNOSIS — I130 Hypertensive heart and chronic kidney disease with heart failure and stage 1 through stage 4 chronic kidney disease, or unspecified chronic kidney disease: Secondary | ICD-10-CM | POA: Diagnosis not present

## 2016-11-11 DIAGNOSIS — I5023 Acute on chronic systolic (congestive) heart failure: Secondary | ICD-10-CM | POA: Diagnosis not present

## 2016-11-11 DIAGNOSIS — E538 Deficiency of other specified B group vitamins: Secondary | ICD-10-CM | POA: Diagnosis not present

## 2016-11-11 DIAGNOSIS — J209 Acute bronchitis, unspecified: Secondary | ICD-10-CM | POA: Diagnosis not present

## 2016-11-11 DIAGNOSIS — G250 Essential tremor: Secondary | ICD-10-CM | POA: Diagnosis not present

## 2016-11-11 DIAGNOSIS — E1122 Type 2 diabetes mellitus with diabetic chronic kidney disease: Secondary | ICD-10-CM | POA: Diagnosis not present

## 2016-11-11 MED ORDER — CYANOCOBALAMIN 1000 MCG/ML IJ SOLN
1000.0000 ug | Freq: Once | INTRAMUSCULAR | Status: AC
  Administered 2016-11-11: 1000 ug via INTRAMUSCULAR

## 2016-11-11 NOTE — Progress Notes (Signed)
Pt arrives for B 12 injection.Cyanocobalamin 1056mcg/1ml administered IM to L deltoid using aseptic technique. Pt tolerated well.Bandaid applied.

## 2016-11-12 DIAGNOSIS — I5023 Acute on chronic systolic (congestive) heart failure: Secondary | ICD-10-CM | POA: Diagnosis not present

## 2016-11-12 DIAGNOSIS — N183 Chronic kidney disease, stage 3 (moderate): Secondary | ICD-10-CM | POA: Diagnosis not present

## 2016-11-12 DIAGNOSIS — I130 Hypertensive heart and chronic kidney disease with heart failure and stage 1 through stage 4 chronic kidney disease, or unspecified chronic kidney disease: Secondary | ICD-10-CM | POA: Diagnosis not present

## 2016-11-12 DIAGNOSIS — J209 Acute bronchitis, unspecified: Secondary | ICD-10-CM | POA: Diagnosis not present

## 2016-11-12 DIAGNOSIS — G250 Essential tremor: Secondary | ICD-10-CM | POA: Diagnosis not present

## 2016-11-12 DIAGNOSIS — E1122 Type 2 diabetes mellitus with diabetic chronic kidney disease: Secondary | ICD-10-CM | POA: Diagnosis not present

## 2016-11-13 DIAGNOSIS — J209 Acute bronchitis, unspecified: Secondary | ICD-10-CM | POA: Diagnosis not present

## 2016-11-13 DIAGNOSIS — N183 Chronic kidney disease, stage 3 (moderate): Secondary | ICD-10-CM | POA: Diagnosis not present

## 2016-11-13 DIAGNOSIS — I5023 Acute on chronic systolic (congestive) heart failure: Secondary | ICD-10-CM | POA: Diagnosis not present

## 2016-11-13 DIAGNOSIS — I130 Hypertensive heart and chronic kidney disease with heart failure and stage 1 through stage 4 chronic kidney disease, or unspecified chronic kidney disease: Secondary | ICD-10-CM | POA: Diagnosis not present

## 2016-11-13 DIAGNOSIS — G250 Essential tremor: Secondary | ICD-10-CM | POA: Diagnosis not present

## 2016-11-13 DIAGNOSIS — E1122 Type 2 diabetes mellitus with diabetic chronic kidney disease: Secondary | ICD-10-CM | POA: Diagnosis not present

## 2016-11-14 ENCOUNTER — Telehealth: Payer: Self-pay | Admitting: *Deleted

## 2016-11-14 ENCOUNTER — Encounter: Payer: Self-pay | Admitting: Cardiology

## 2016-11-14 ENCOUNTER — Ambulatory Visit (INDEPENDENT_AMBULATORY_CARE_PROVIDER_SITE_OTHER): Payer: Medicare Other | Admitting: Cardiology

## 2016-11-14 VITALS — BP 140/70 | HR 94 | Ht 65.0 in | Wt 157.8 lb

## 2016-11-14 DIAGNOSIS — I480 Paroxysmal atrial fibrillation: Secondary | ICD-10-CM | POA: Diagnosis not present

## 2016-11-14 DIAGNOSIS — I10 Essential (primary) hypertension: Secondary | ICD-10-CM

## 2016-11-14 DIAGNOSIS — I428 Other cardiomyopathies: Secondary | ICD-10-CM | POA: Diagnosis not present

## 2016-11-14 DIAGNOSIS — I5022 Chronic systolic (congestive) heart failure: Secondary | ICD-10-CM | POA: Diagnosis not present

## 2016-11-14 DIAGNOSIS — Z7901 Long term (current) use of anticoagulants: Secondary | ICD-10-CM | POA: Diagnosis not present

## 2016-11-14 DIAGNOSIS — I951 Orthostatic hypotension: Secondary | ICD-10-CM

## 2016-11-14 MED ORDER — METOPROLOL SUCCINATE ER 25 MG PO TB24: ORAL_TABLET | ORAL | 6 refills | Status: DC

## 2016-11-14 NOTE — Patient Instructions (Signed)
Medication Instructions:  Your physician has recommended you make the following change in your medication:  1. Increase Metoprolol one and one half tablets (37.5 mg ) daily, sent in to mail order   Labwork: -None  Testing/Procedures: -None  Follow-Up: Your physician recommends that you keep your scheduled  follow-up appointment with Cecilie Kicks, NP Your physician recommends that you keep your scheduled follow-up appointment with [name].   Any Other Special Instructions Will Be Listed Below (If Applicable).     If you need a refill on your cardiac medications before your next appointment, please call your pharmacy.

## 2016-11-14 NOTE — Telephone Encounter (Signed)
S/w [facility] # [phone], office is going to send over pt's recent lab results.
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
## 2016-11-15 ENCOUNTER — Telehealth: Payer: Self-pay | Admitting: Cardiology

## 2016-11-15 NOTE — Telephone Encounter (Signed)
-----   Message from Isaiah Serge, NP sent at 11/15/2016 12:19 PM EST ----- Labs were stable on PCP labs, HF lab test much improved.

## 2016-11-15 NOTE — Telephone Encounter (Signed)
Returned granddaughter, [name]'s call and discussed pts lab results.

## 2016-11-15 NOTE — Telephone Encounter (Signed)
Follow up      Returning Jennifers call about lab results

## 2016-11-18 DIAGNOSIS — N183 Chronic kidney disease, stage 3 (moderate): Secondary | ICD-10-CM | POA: Diagnosis not present

## 2016-11-18 DIAGNOSIS — I130 Hypertensive heart and chronic kidney disease with heart failure and stage 1 through stage 4 chronic kidney disease, or unspecified chronic kidney disease: Secondary | ICD-10-CM | POA: Diagnosis not present

## 2016-11-18 DIAGNOSIS — J209 Acute bronchitis, unspecified: Secondary | ICD-10-CM | POA: Diagnosis not present

## 2016-11-18 DIAGNOSIS — E1122 Type 2 diabetes mellitus with diabetic chronic kidney disease: Secondary | ICD-10-CM | POA: Diagnosis not present

## 2016-11-18 DIAGNOSIS — I5023 Acute on chronic systolic (congestive) heart failure: Secondary | ICD-10-CM | POA: Diagnosis not present

## 2016-11-18 DIAGNOSIS — G250 Essential tremor: Secondary | ICD-10-CM | POA: Diagnosis not present

## 2016-11-22 DIAGNOSIS — N183 Chronic kidney disease, stage 3 (moderate): Secondary | ICD-10-CM | POA: Diagnosis not present

## 2016-11-22 DIAGNOSIS — J209 Acute bronchitis, unspecified: Secondary | ICD-10-CM | POA: Diagnosis not present

## 2016-11-22 DIAGNOSIS — I5023 Acute on chronic systolic (congestive) heart failure: Secondary | ICD-10-CM | POA: Diagnosis not present

## 2016-11-22 DIAGNOSIS — I130 Hypertensive heart and chronic kidney disease with heart failure and stage 1 through stage 4 chronic kidney disease, or unspecified chronic kidney disease: Secondary | ICD-10-CM | POA: Diagnosis not present

## 2016-11-22 DIAGNOSIS — E1122 Type 2 diabetes mellitus with diabetic chronic kidney disease: Secondary | ICD-10-CM | POA: Diagnosis not present

## 2016-11-22 DIAGNOSIS — G250 Essential tremor: Secondary | ICD-10-CM | POA: Diagnosis not present

## 2016-12-09 ENCOUNTER — Ambulatory Visit (INDEPENDENT_AMBULATORY_CARE_PROVIDER_SITE_OTHER): Payer: Medicare Other | Admitting: *Deleted

## 2016-12-09 DIAGNOSIS — E538 Deficiency of other specified B group vitamins: Secondary | ICD-10-CM

## 2016-12-09 MED ORDER — CYANOCOBALAMIN 1000 MCG/ML IJ SOLN
1000.0000 ug | Freq: Once | INTRAMUSCULAR | Status: AC
  Administered 2016-12-09: 1000 ug via INTRAMUSCULAR

## 2016-12-09 NOTE — Progress Notes (Signed)
Gave Vitamin B12 IM in left deltoid. Cleaned with alcohol wipe prior to injection. Band-aid applied. Pt tolerated well.

## 2016-12-10 ENCOUNTER — Ambulatory Visit (INDEPENDENT_AMBULATORY_CARE_PROVIDER_SITE_OTHER): Payer: Medicare Other | Admitting: Cardiology

## 2016-12-10 ENCOUNTER — Encounter: Payer: Self-pay | Admitting: Cardiology

## 2016-12-10 ENCOUNTER — Ambulatory Visit: Payer: Medicare Other | Admitting: Cardiology

## 2016-12-10 VITALS — BP 130/70 | HR 92 | Ht 64.0 in | Wt 156.5 lb

## 2016-12-10 DIAGNOSIS — I480 Paroxysmal atrial fibrillation: Secondary | ICD-10-CM | POA: Diagnosis not present
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
## 2016-12-10 NOTE — Patient Instructions (Addendum)
Medication Instructions:  Your physician recommends that you continue on your current medications as directed. Please refer to the Current Medication list given to you today.  Labwork: None ordered  Testing/Procedures: None ordered  Follow-Up: Your physician recommends that you schedule a follow-up appointment in: [place] 01/06/17   Any Other Special Instructions Will Be Listed Below (If Applicable).     If you need a refill on your cardiac medications before your next appointment, please call your pharmacy.

## 2016-12-26 ENCOUNTER — Other Ambulatory Visit: Payer: Self-pay | Admitting: Endocrinology

## 2016-12-31 DIAGNOSIS — H04123 Dry eye syndrome of bilateral lacrimal glands: Secondary | ICD-10-CM | POA: Diagnosis not present

## 2017-01-02 ENCOUNTER — Other Ambulatory Visit: Payer: Self-pay | Admitting: Adult Health

## 2017-01-02 ENCOUNTER — Other Ambulatory Visit: Payer: Self-pay

## 2017-01-02 MED ORDER — ONETOUCH DELICA LANCETS 33G MISC: 1 refills | Status: DC

## 2017-01-02 MED ORDER — INSULIN PEN NEEDLE 32G X 4 MM MISC: 1 refills | Status: DC

## 2017-01-04 ENCOUNTER — Other Ambulatory Visit: Payer: Self-pay | Admitting: Endocrinology

## 2017-01-06 ENCOUNTER — Ambulatory Visit (INDEPENDENT_AMBULATORY_CARE_PROVIDER_SITE_OTHER): Payer: Medicare Other | Admitting: Cardiology

## 2017-01-06 ENCOUNTER — Encounter: Payer: Self-pay | Admitting: Cardiology

## 2017-01-06 VITALS — BP 112/64 | HR 73 | Ht 65.0 in | Wt 157.0 lb

## 2017-01-06 DIAGNOSIS — I420 Dilated cardiomyopathy: Secondary | ICD-10-CM

## 2017-01-06 DIAGNOSIS — I480 Paroxysmal atrial fibrillation: Secondary | ICD-10-CM | POA: Diagnosis not present

## 2017-01-06 DIAGNOSIS — I5022 Chronic systolic (congestive) heart failure: Secondary | ICD-10-CM

## 2017-01-06 NOTE — Patient Instructions (Signed)
Medication Instructions:  The current medical regimen is effective;  continue present plan and medications.  Follow-Up: Follow up in 4 months with Dr Skains.  If you need a refill on your cardiac medications before your next appointment, please call your pharmacy.  Thank you for choosing [facility]!!     
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
## 2017-01-07 ENCOUNTER — Ambulatory Visit (INDEPENDENT_AMBULATORY_CARE_PROVIDER_SITE_OTHER): Payer: Medicare Other | Admitting: *Deleted

## 2017-01-07 DIAGNOSIS — E538 Deficiency of other specified B group vitamins: Secondary | ICD-10-CM | POA: Diagnosis not present

## 2017-01-07 MED ORDER — CYANOCOBALAMIN 1000 MCG/ML IJ SOLN
1000.0000 ug | Freq: Once | INTRAMUSCULAR | Status: AC
  Administered 2017-01-07: 1000 ug via INTRAMUSCULAR

## 2017-01-07 NOTE — Progress Notes (Signed)
Gave Vitamin B12 IM in right deltoid. Cleaned with alcohol wipe prior to injection. Band-aid applied. Pt tolerated well.

## 2017-01-13 ENCOUNTER — Other Ambulatory Visit (INDEPENDENT_AMBULATORY_CARE_PROVIDER_SITE_OTHER): Payer: Medicare Other

## 2017-01-13 DIAGNOSIS — Z794 Long term (current) use of insulin: Secondary | ICD-10-CM

## 2017-01-13 DIAGNOSIS — E1165 Type 2 diabetes mellitus with hyperglycemia: Secondary | ICD-10-CM | POA: Diagnosis not present

## 2017-01-13 LAB — COMPREHENSIVE METABOLIC PANEL
ALT: 14 U/L (ref 0–35)
AST: 16 U/L (ref 0–37)
Albumin: 4 g/dL (ref 3.5–5.2)
Alkaline Phosphatase: 43 U/L (ref 39–117)
BUN: 22 mg/dL (ref 6–23)
CHLORIDE: 100 meq/L (ref 96–112)
CO2: 33 meq/L — AB (ref 19–32)
Calcium: 9 mg/dL (ref 8.4–10.5)
Creatinine, Ser: 1.31 mg/dL — ABNORMAL HIGH (ref 0.40–1.20)
GFR: 40.84 mL/min — ABNORMAL LOW (ref >60.00)
Glucose, Bld: 95 mg/dL (ref 70–99)
POTASSIUM: 4.3 meq/L (ref 3.5–5.1)
SODIUM: 137 meq/L (ref 135–145)
TOTAL PROTEIN: 6.9 g/dL (ref 6.0–8.3)
Total Bilirubin: 0.2 mg/dL (ref 0.2–1.2)

## 2017-01-13 LAB — HEMOGLOBIN A1C: HEMOGLOBIN A1C: 5.1 % (ref 4.6–6.5)

## 2017-01-14 LAB — FRUCTOSAMINE: Fructosamine: 249 umol/L (ref 0–285)

## 2017-01-17 ENCOUNTER — Encounter: Payer: Self-pay | Admitting: Endocrinology

## 2017-01-17 ENCOUNTER — Ambulatory Visit (INDEPENDENT_AMBULATORY_CARE_PROVIDER_SITE_OTHER): Payer: Medicare Other | Admitting: Endocrinology

## 2017-01-17 VITALS — BP 128/70 | HR 89 | Ht 65.0 in | Wt 155.0 lb

## 2017-01-17 DIAGNOSIS — E1122 Type 2 diabetes mellitus with diabetic chronic kidney disease: Secondary | ICD-10-CM | POA: Diagnosis not present

## 2017-01-17 DIAGNOSIS — E038 Other specified hypothyroidism: Secondary | ICD-10-CM

## 2017-01-17 DIAGNOSIS — E1165 Type 2 diabetes mellitus with hyperglycemia: Secondary | ICD-10-CM | POA: Diagnosis not present

## 2017-01-17 DIAGNOSIS — Z794 Long term (current) use of insulin: Secondary | ICD-10-CM | POA: Diagnosis not present

## 2017-01-17 DIAGNOSIS — N183 Chronic kidney disease, stage 3 (moderate): Secondary | ICD-10-CM

## 2017-01-17 DIAGNOSIS — E063 Autoimmune thyroiditis: Secondary | ICD-10-CM

## 2017-01-17 DIAGNOSIS — E1142 Type 2 diabetes mellitus with diabetic polyneuropathy: Secondary | ICD-10-CM
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
## 2017-01-17 NOTE — Patient Instructions (Signed)
Levemir 22 in am and no Novolog at lunch  Check blood sugars on waking up  3x weekly  Also check blood sugars about 2 hours after a meal and do this after different meals by rotation  Recommended blood sugar levels on waking up is 90-130 and about 2 hours after meal is 130-160  Please bring your blood sugar monitor to each visit, thank you

## 2017-01-22 ENCOUNTER — Encounter: Payer: Self-pay | Admitting: Neurology

## 2017-01-22 ENCOUNTER — Ambulatory Visit (INDEPENDENT_AMBULATORY_CARE_PROVIDER_SITE_OTHER): Payer: Medicare Other | Admitting: Neurology

## 2017-01-22 VITALS — BP 142/74 | HR 98 | Ht 65.0 in | Wt 156.5 lb

## 2017-01-22 DIAGNOSIS — R202 Paresthesia of skin: Secondary | ICD-10-CM

## 2017-01-22 DIAGNOSIS — Z794 Long term (current) use of insulin: Secondary | ICD-10-CM | POA: Diagnosis not present

## 2017-01-22 DIAGNOSIS — E1140 Type 2 diabetes mellitus with diabetic neuropathy, unspecified: Secondary | ICD-10-CM

## 2017-01-22 NOTE — Patient Instructions (Signed)
   Take the Lyrica 50 mg capsules 2 or 3 at night before bedtime.
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
## 2017-02-01 ENCOUNTER — Other Ambulatory Visit: Payer: Self-pay | Admitting: Neurology

## 2017-02-04 ENCOUNTER — Ambulatory Visit (INDEPENDENT_AMBULATORY_CARE_PROVIDER_SITE_OTHER): Payer: Medicare Other

## 2017-02-04 VITALS — BP 110/54 | HR 88

## 2017-02-04 DIAGNOSIS — E538 Deficiency of other specified B group vitamins: Secondary | ICD-10-CM

## 2017-02-04 MED ORDER — CYANOCOBALAMIN 1000 MCG/ML IJ SOLN
1000.0000 ug | Freq: Once | INTRAMUSCULAR | Status: AC
  Administered 2017-02-04: 1000 ug via INTRAMUSCULAR

## 2017-02-11 DIAGNOSIS — H04123 Dry eye syndrome of bilateral lacrimal glands: Secondary | ICD-10-CM | POA: Diagnosis not present

## 2017-02-25 DIAGNOSIS — E119 Type 2 diabetes mellitus without complications: Secondary | ICD-10-CM | POA: Diagnosis not present

## 2017-03-05 ENCOUNTER — Ambulatory Visit (INDEPENDENT_AMBULATORY_CARE_PROVIDER_SITE_OTHER): Payer: Medicare Other | Admitting: *Deleted

## 2017-03-05 DIAGNOSIS — E538 Deficiency of other specified B group vitamins: Secondary | ICD-10-CM

## 2017-03-05 MED ORDER — CYANOCOBALAMIN 1000 MCG/ML IJ SOLN
1000.0000 ug | Freq: Once | INTRAMUSCULAR | Status: AC
  Administered 2017-03-05: 1000 ug via INTRAMUSCULAR

## 2017-03-05 NOTE — Progress Notes (Signed)
Gave Vit B12 1000mg /43ml IM in left deltoid. Cleaned with alcohol wipe prior to injection. Band-aid applied. Pt tolerated well.

## 2017-03-10 ENCOUNTER — Other Ambulatory Visit: Payer: Self-pay | Admitting: Endocrinology

## 2017-03-21 DIAGNOSIS — N390 Urinary tract infection, site not specified: Secondary | ICD-10-CM | POA: Diagnosis not present

## 2017-03-21 DIAGNOSIS — R109 Unspecified abdominal pain: Secondary | ICD-10-CM | POA: Diagnosis not present

## 2017-03-27 ENCOUNTER — Other Ambulatory Visit: Payer: Self-pay | Admitting: Cardiology

## 2017-03-27 MED ORDER — APIXABAN 5 MG PO TABS: 5.0000 mg | ORAL_TABLET | Freq: Two times a day (BID) | ORAL | 1 refills | Status: DC

## 2017-03-27 NOTE — Telephone Encounter (Signed)
SCr consistently < 1.5 and weight > 60kg, pt qualifies for Eliquis 5mg  BID. Spoke with pt's granddaughter who organizes patient's medication to let her know of dose change.

## 2017-04-08 ENCOUNTER — Ambulatory Visit: Payer: Medicare Other

## 2017-04-08 ENCOUNTER — Telehealth: Payer: Self-pay | Admitting: Neurology

## 2017-04-08 NOTE — Telephone Encounter (Signed)
Called daughter back (on [place]). R/S B12 injection for 04/14/17 at 1:00pm. She verbalized understanding and appreciation for call.

## 2017-04-08 NOTE — Telephone Encounter (Signed)
Pt daughter calling to cancel the nurse visit today for the B12 due to pt not feeling well.  Please call daughter so she may reschedule appointment

## 2017-04-15 ENCOUNTER — Ambulatory Visit (INDEPENDENT_AMBULATORY_CARE_PROVIDER_SITE_OTHER): Payer: Medicare Other | Admitting: *Deleted

## 2017-04-15 DIAGNOSIS — I63113 Cerebral infarction due to embolism of bilateral vertebral arteries: Secondary | ICD-10-CM

## 2017-04-15 DIAGNOSIS — E538 Deficiency of other specified B group vitamins: Secondary | ICD-10-CM | POA: Diagnosis not present

## 2017-04-15 MED ORDER — CYANOCOBALAMIN 1000 MCG/ML IJ SOLN
1000.0000 ug | Freq: Once | INTRAMUSCULAR | Status: AC
  Administered 2017-04-15: 1000 ug via INTRAMUSCULAR

## 2017-04-21 ENCOUNTER — Other Ambulatory Visit (INDEPENDENT_AMBULATORY_CARE_PROVIDER_SITE_OTHER): Payer: Medicare Other

## 2017-04-21 DIAGNOSIS — E1165 Type 2 diabetes mellitus with hyperglycemia: Secondary | ICD-10-CM | POA: Diagnosis not present

## 2017-04-21 DIAGNOSIS — Z794 Long term (current) use of insulin: Secondary | ICD-10-CM | POA: Diagnosis not present

## 2017-04-21 LAB — COMPREHENSIVE METABOLIC PANEL
ALK PHOS: 39 U/L (ref 39–117)
ALT: 13 U/L (ref 0–35)
AST: 15 U/L (ref 0–37)
Albumin: 4 g/dL (ref 3.5–5.2)
BILIRUBIN TOTAL: 0.2 mg/dL (ref 0.2–1.2)
BUN: 18 mg/dL (ref 6–23)
CO2: 31 mEq/L (ref 19–32)
Calcium: 9.5 mg/dL (ref 8.4–10.5)
Chloride: 104 mEq/L (ref 96–112)
Creatinine, Ser: 1.18 mg/dL (ref 0.40–1.20)
GFR: 46.04 mL/min — ABNORMAL LOW (ref >60.00)
GLUCOSE: 50 mg/dL — AB (ref 70–99)
Potassium: 4.7 mEq/L (ref 3.5–5.1)
SODIUM: 138 meq/L (ref 135–145)
Total Protein: 6.7 g/dL (ref 6.0–8.3)

## 2017-04-21 LAB — MICROALBUMIN / CREATININE URINE RATIO
Creatinine,U: 78.2 mg/dL
MICROALB/CREAT RATIO: 0.9 mg/g (ref 0.0–30.0)

## 2017-04-21 LAB — TSH: TSH: 2.85 u[IU]/mL (ref 0.35–4.50)

## 2017-04-21 LAB — LIPID PANEL
Cholesterol: 129 mg/dL (ref 0–200)
HDL: 42.8 mg/dL (ref >39.00)
LDL Cholesterol: 49 mg/dL (ref 0–99)
NONHDL: 86.44
Total CHOL/HDL Ratio: 3
Triglycerides: 189 mg/dL — ABNORMAL HIGH (ref 0.0–149.0)
VLDL: 37.8 mg/dL (ref 0.0–40.0)

## 2017-04-21 LAB — HEMOGLOBIN A1C: HEMOGLOBIN A1C: 4.8 % (ref 4.6–6.5)

## 2017-04-25 ENCOUNTER — Encounter: Payer: Self-pay | Admitting: Endocrinology

## 2017-04-25 ENCOUNTER — Ambulatory Visit (INDEPENDENT_AMBULATORY_CARE_PROVIDER_SITE_OTHER): Payer: Medicare Other | Admitting: Endocrinology

## 2017-04-25 VITALS — BP 120/78 | HR 89 | Ht 65.0 in | Wt 158.0 lb

## 2017-04-25 DIAGNOSIS — Z794 Long term (current) use of insulin: Secondary | ICD-10-CM

## 2017-04-25 DIAGNOSIS — I63113 Cerebral infarction due to embolism of bilateral vertebral arteries: Secondary | ICD-10-CM | POA: Diagnosis not present

## 2017-04-25 DIAGNOSIS — E1165 Type 2 diabetes mellitus with hyperglycemia: Secondary | ICD-10-CM
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
## 2017-05-19 ENCOUNTER — Ambulatory Visit (INDEPENDENT_AMBULATORY_CARE_PROVIDER_SITE_OTHER): Payer: Medicare Other | Admitting: *Deleted

## 2017-05-19 ENCOUNTER — Ambulatory Visit: Payer: Medicare Other

## 2017-05-19 DIAGNOSIS — E538 Deficiency of other specified B group vitamins: Secondary | ICD-10-CM | POA: Diagnosis not present

## 2017-05-19 MED ORDER — CYANOCOBALAMIN 1000 MCG/ML IJ SOLN
1000.0000 ug | Freq: Once | INTRAMUSCULAR | Status: AC
  Administered 2017-05-19: 1000 ug via INTRAMUSCULAR

## 2017-05-19 NOTE — Progress Notes (Signed)
Gave Vitamin B12 1063mcg/ml in left deltoid. Cleaned with alcohol wipe prior to injection. Band-aid applied. Pt tolerated well.

## 2017-05-26 ENCOUNTER — Emergency Department (HOSPITAL_COMMUNITY)
Admission: EM | Admit: 2017-05-26 | Discharge: 2017-05-27 | Disposition: A | Discharge status: Home / Self Care | Payer: Medicare Other | Attending: Emergency Medicine | Admitting: Emergency Medicine

## 2017-05-26 ENCOUNTER — Encounter (HOSPITAL_COMMUNITY): Payer: Self-pay | Admitting: Emergency Medicine

## 2017-05-26 ENCOUNTER — Emergency Department (HOSPITAL_COMMUNITY): Payer: Medicare Other

## 2017-05-26 DIAGNOSIS — Y9389 Activity, other specified: Secondary | ICD-10-CM | POA: Diagnosis not present

## 2017-05-26 DIAGNOSIS — Z7901 Long term (current) use of anticoagulants: Secondary | ICD-10-CM | POA: Diagnosis not present

## 2017-05-26 DIAGNOSIS — W1789XA Other fall from one level to another, initial encounter: Secondary | ICD-10-CM | POA: Insufficient documentation

## 2017-05-26 DIAGNOSIS — T148XXA Other injury of unspecified body region, initial encounter: Secondary | ICD-10-CM | POA: Diagnosis not present

## 2017-05-26 DIAGNOSIS — Y998 Other external cause status: Secondary | ICD-10-CM | POA: Diagnosis not present

## 2017-05-26 DIAGNOSIS — W19XXXA Unspecified fall, initial encounter: Secondary | ICD-10-CM

## 2017-05-26 DIAGNOSIS — Z87891 Personal history of nicotine dependence: Secondary | ICD-10-CM | POA: Insufficient documentation

## 2017-05-26 DIAGNOSIS — Z8673 Personal history of transient ischemic attack (TIA), and cerebral infarction without residual deficits: Secondary | ICD-10-CM | POA: Insufficient documentation

## 2017-05-26 DIAGNOSIS — S42221A 2-part displaced fracture of surgical neck of right humerus, initial encounter for closed fracture: Secondary | ICD-10-CM | POA: Diagnosis not present

## 2017-05-26 DIAGNOSIS — J45909 Unspecified asthma, uncomplicated: Secondary | ICD-10-CM | POA: Diagnosis not present

## 2017-05-26 DIAGNOSIS — I5022 Chronic systolic (congestive) heart failure: Secondary | ICD-10-CM | POA: Diagnosis not present

## 2017-05-26 DIAGNOSIS — Y92099 Unspecified place in other non-institutional residence as the place of occurrence of the external cause: Secondary | ICD-10-CM | POA: Diagnosis not present

## 2017-05-26 DIAGNOSIS — N183 Chronic kidney disease, stage 3 (moderate): Secondary | ICD-10-CM | POA: Insufficient documentation

## 2017-05-26 DIAGNOSIS — E1122 Type 2 diabetes mellitus with diabetic chronic kidney disease: Secondary | ICD-10-CM | POA: Diagnosis not present

## 2017-05-26 DIAGNOSIS — I130 Hypertensive heart and chronic kidney disease with heart failure and stage 1 through stage 4 chronic kidney disease, or unspecified chronic kidney disease: Secondary | ICD-10-CM | POA: Diagnosis not present

## 2017-05-26 DIAGNOSIS — S5001XA Contusion of right elbow, initial encounter: Secondary | ICD-10-CM | POA: Diagnosis not present

## 2017-05-26 DIAGNOSIS — M25511 Pain in right shoulder: Secondary | ICD-10-CM | POA: Diagnosis not present

## 2017-05-26 DIAGNOSIS — S4991XA Unspecified injury of right shoulder and upper arm, initial encounter: Secondary | ICD-10-CM | POA: Diagnosis present

## 2017-05-26 MED ORDER — OXYCODONE-ACETAMINOPHEN 5-325 MG PO TABS
1.0000 | ORAL_TABLET | Freq: Once | ORAL | Status: AC
  Administered 2017-05-27: 1 via ORAL
  Filled 2017-05-26: qty 1

## 2017-05-26 MED ORDER — KETOROLAC TROMETHAMINE 15 MG/ML IJ SOLN
7.5000 mg | Freq: Once | INTRAMUSCULAR | Status: AC
  Administered 2017-05-27: 7.5 mg via INTRAVENOUS
  Filled 2017-05-26: qty 1

## 2017-05-26 MED ORDER — OXYCODONE-ACETAMINOPHEN 5-325 MG PO TABS
1.0000 | ORAL_TABLET | Freq: Once | ORAL | Status: AC
  Administered 2017-05-26: 1 via ORAL
  Filled 2017-05-26: qty 1

## 2017-05-26 MED ORDER — ACETAMINOPHEN 325 MG PO TABS
650.0000 mg | ORAL_TABLET | Freq: Once | ORAL | Status: AC
  Administered 2017-05-27: 650 mg via ORAL
  Filled 2017-05-26: qty 2

## 2017-05-26 NOTE — ED Triage Notes (Signed)
Per EMS, pt reports falling down from her washer after attempting to reach something on a shelf. Pt landed on her rt shoulder, obvious deformities noted. EMS gave 227mcg of Fentanyl, pt denies pain at this time unless she moves her arm. Cap refill and pulses intact.
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
## 2017-05-26 NOTE — Progress Notes (Signed)
Orthopedic Tech Progress Note Patient Details:  Melissa [name] [birth year] [id number]  Ortho Devices Type of Ortho Device: Shoulder immobilizer Ortho Device/Splint Location: RUE Ortho Device/Splint Interventions: Ordered, Application   Braulio Bosch 05/26/2017, 10:03 PM

## 2017-05-26 NOTE — ED Notes (Signed)
Patient transported to X-ray 

## 2017-05-27 ENCOUNTER — Encounter: Payer: Self-pay | Admitting: *Deleted

## 2017-05-27 DIAGNOSIS — S42221A 2-part displaced fracture of surgical neck of right humerus, initial encounter for closed fracture: Secondary | ICD-10-CM | POA: Diagnosis not present

## 2017-05-27 MED ORDER — ACETAMINOPHEN 325 MG PO TABS: 650.0000 mg | ORAL_TABLET | ORAL | 0 refills | Status: DC | PRN

## 2017-05-27 MED ORDER — MORPHINE SULFATE 15 MG PO TABS: 15.0000 mg | ORAL_TABLET | ORAL | 0 refills | Status: AC | PRN

## 2017-05-27 NOTE — Progress Notes (Signed)
EDCM noted CM consult.  Reviewed chart and basis for consult are unclear as the CM consult order is incomplete and the EDP discharge note does not mention CM instructions.    

## 2017-05-27 NOTE — Discharge Instructions (Signed)
Thank you for allowing [place] to care for you  Xray showed a fracture of your right arm bone at your shoulder.  We have provided you with a orthopedic surgery referral. Please call in the morning to schedule an appointment.  We have prescribed pain medication. Please take this as directed and and contact your primary care provider if you have concerns or difficulty maintaining a manageable level of pain.  You also have been prescribe Acetaminophen. If you already have OTC acetaminophen, you can take this at the prescribed dose. Do not take more than the prescribed amount.

## 2017-05-27 NOTE — ED Notes (Signed)
Pt departed in NAD.  

## 2017-05-29 DIAGNOSIS — N390 Urinary tract infection, site not specified: Secondary | ICD-10-CM | POA: Diagnosis not present

## 2017-05-29 DIAGNOSIS — S42221A 2-part displaced fracture of surgical neck of right humerus, initial encounter for closed fracture: Secondary | ICD-10-CM | POA: Diagnosis not present

## 2017-06-03 ENCOUNTER — Telehealth: Payer: Self-pay | Admitting: *Deleted

## 2017-06-03 ENCOUNTER — Other Ambulatory Visit (HOSPITAL_COMMUNITY): Payer: Self-pay | Admitting: *Deleted

## 2017-06-03 ENCOUNTER — Encounter (HOSPITAL_COMMUNITY)
Admission: RE | Admit: 2017-06-03 | Discharge: 2017-06-03 | Disposition: A | Discharge status: Home / Self Care | Payer: Medicare Other | Source: Ambulatory Visit | Attending: Orthopedic Surgery | Admitting: Orthopedic Surgery

## 2017-06-03 ENCOUNTER — Encounter (HOSPITAL_COMMUNITY): Payer: Self-pay

## 2017-06-03 DIAGNOSIS — Z01818 Encounter for other preprocedural examination: Secondary | ICD-10-CM

## 2017-06-03 DIAGNOSIS — J45909 Unspecified asthma, uncomplicated: Secondary | ICD-10-CM

## 2017-06-03 DIAGNOSIS — Z87891 Personal history of nicotine dependence: Secondary | ICD-10-CM

## 2017-06-03 DIAGNOSIS — E119 Type 2 diabetes mellitus without complications: Secondary | ICD-10-CM

## 2017-06-03 DIAGNOSIS — Z9049 Acquired absence of other specified parts of digestive tract: Secondary | ICD-10-CM | POA: Insufficient documentation

## 2017-06-03 DIAGNOSIS — Z8673 Personal history of transient ischemic attack (TIA), and cerebral infarction without residual deficits: Secondary | ICD-10-CM

## 2017-06-03 DIAGNOSIS — S99811A Other specified injuries of right ankle, initial encounter: Secondary | ICD-10-CM | POA: Diagnosis not present

## 2017-06-03 DIAGNOSIS — E039 Hypothyroidism, unspecified: Secondary | ICD-10-CM | POA: Insufficient documentation

## 2017-06-03 DIAGNOSIS — K219 Gastro-esophageal reflux disease without esophagitis: Secondary | ICD-10-CM | POA: Insufficient documentation

## 2017-06-03 DIAGNOSIS — S99821A Other specified injuries of right foot, initial encounter: Secondary | ICD-10-CM | POA: Diagnosis not present

## 2017-06-03 DIAGNOSIS — I5022 Chronic systolic (congestive) heart failure: Secondary | ICD-10-CM | POA: Insufficient documentation

## 2017-06-03 DIAGNOSIS — S99822A Other specified injuries of left foot, initial encounter: Secondary | ICD-10-CM | POA: Diagnosis not present

## 2017-06-03 DIAGNOSIS — I110 Hypertensive heart disease with heart failure: Secondary | ICD-10-CM

## 2017-06-03 DIAGNOSIS — Z7902 Long term (current) use of antithrombotics/antiplatelets: Secondary | ICD-10-CM | POA: Insufficient documentation

## 2017-06-03 DIAGNOSIS — D509 Iron deficiency anemia, unspecified: Secondary | ICD-10-CM | POA: Insufficient documentation

## 2017-06-03 DIAGNOSIS — I4891 Unspecified atrial fibrillation: Secondary | ICD-10-CM

## 2017-06-03 DIAGNOSIS — I429 Cardiomyopathy, unspecified: Secondary | ICD-10-CM | POA: Insufficient documentation

## 2017-06-03 DIAGNOSIS — E785 Hyperlipidemia, unspecified: Secondary | ICD-10-CM | POA: Insufficient documentation

## 2017-06-03 HISTORY — DX: Cerebral infarction, unspecified: I63.9

## 2017-06-03 HISTORY — DX: Dyspnea, unspecified: R06.00

## 2017-06-03 LAB — CBC
HCT: 33.3 % — ABNORMAL LOW (ref 36.0–46.0)
HEMOGLOBIN: 10.1 g/dL — AB (ref 12.0–15.0)
MCH: 27.2 pg (ref 26.0–34.0)
MCHC: 30.3 g/dL (ref 30.0–36.0)
MCV: 89.5 fL (ref 78.0–100.0)
Platelets: 282 10*3/uL (ref 150–400)
RBC: 3.72 MIL/uL — ABNORMAL LOW (ref 3.87–5.11)
RDW: 14.2 % (ref 11.5–15.5)
WBC: 8.6 10*3/uL (ref 4.0–10.5)

## 2017-06-03 LAB — BASIC METABOLIC PANEL
ANION GAP: 8 (ref 5–15)
BUN: 14 mg/dL (ref 6–20)
CALCIUM: 8.9 mg/dL (ref 8.9–10.3)
CO2: 30 mmol/L (ref 22–32)
CREATININE: 1.31 mg/dL — AB (ref 0.44–1.00)
Chloride: 99 mmol/L — ABNORMAL LOW (ref 101–111)
GFR calc Af Amer: 41 mL/min — ABNORMAL LOW (ref >60)
GFR calc non Af Amer: 35 mL/min — ABNORMAL LOW (ref >60)
GLUCOSE: 178 mg/dL — AB (ref 65–99)
Potassium: 4.2 mmol/L (ref 3.5–5.1)
Sodium: 137 mmol/L (ref 135–145)

## 2017-06-03 LAB — GLUCOSE, CAPILLARY: Glucose-Capillary: 175 mg/dL — ABNORMAL HIGH (ref 65–99)

## 2017-06-03 NOTE — Pre-Procedure Instructions (Signed)
Melissa [name]  06/03/2017      [id number] [place] [place] [address] [place] [address] Phone: [phone] Fax: [phone]  [facility] [place] [address] [place] [facility] [address] [place] [address] Phone: [phone] Fax: [phone]   Your procedure is scheduled on Thursday, June 05, 2017 at 11:00 AM.   Report to [facility] Entrance "A" Admitting Office at 9:00 AM.   Call this number if you have problems the morning of surgery: [phone]   Questions prior to day of surgery, please call [phone] between 8 & 4 PM.   Remember:  Do not eat food or drink liquids after midnight Wednesday, 06/04/17.  Take these medicines the morning of surgery with A SIP OF WATER:   Stop Eliquis as instructed by surgeon/physician.  How to Manage Your Diabetes Before Surgery   Why is it important to control my blood sugar before and after surgery?   Improving blood sugar levels before and after surgery helps healing and can limit problems.  A way of improving blood sugar control is eating a healthy diet by:  - Eating less sugar and carbohydrates  - Increasing activity/exercise  - Talk with your doctor about reaching your blood sugar goals  High blood sugars (greater than 180 mg/dL) can raise your risk of infections and slow down your recovery so you will need to focus on controlling your diabetes during the weeks before surgery.  Make sure that the doctor who takes care of your diabetes knows about your planned surgery including the date and location.  How do I manage my blood sugars before surgery?   Check your blood sugar at least 4 times a day, 2 days before surgery to make sure that they are not too high or low.  Check your blood sugar the morning of your surgery when you wake up and every 2 hours until you get  to the Short-Stay unit.  Treat a low blood sugar (less than 70 mg/dL) with 1/2 cup of clear juice (cranberry or apple), 4 glucose tablets, OR glucose gel.  Recheck blood sugar in 15 minutes after treatment (to make sure it is greater than 70 mg/dL).  If blood sugar is not greater than 70 mg/dL on re-check, call [phone] for further instructions.   Report your blood sugar to the Short-Stay nurse when you get to Short-Stay.  References:  [facility], 2007 "How to Manage your Diabetes Before and After Surgery".  What do I do about my diabetes medications?   THE NIGHT BEFORE SURGERY, take 11 units of Levemir Insulin.   Do not take other diabetes injectables the day of surgery including Byetta, Victoza, Bydureon, and Trulicity.    Do not wear jewelry, make-up or nail polish.  Do not wear lotions, powders, perfumes or deodorant.  Do not shave 48 hours prior to surgery.    Do not bring valuables to the hospital.  [facility] is not responsible for any belongings or valuables.  Contacts, dentures or bridgework may not be worn into surgery.  Leave your suitcase in the car.  After surgery it may be brought to your room.  For patients admitted to the hospital, discharge time will be determined by your treatment team.  [facility] - Preparing for Surgery  Before surgery, you can play an important role.  Because  skin is not sterile, your skin needs to be as free of germs as possible.  You can reduce the number of germs on you skin by washing with CHG (chlorahexidine gluconate) soap before surgery.  CHG is an antiseptic cleaner which kills germs and bonds with the skin to continue killing germs even after washing.  Please DO NOT use if you have an allergy to CHG or antibacterial soaps.  If your skin becomes reddened/irritated stop using the CHG and inform your nurse when you arrive at Short Stay.  Do not shave (including legs and underarms) for at least 48 hours  prior to the first CHG shower.  You may shave your face.  Please follow these instructions carefully:   1.  Shower with CHG Soap the night before surgery and the                    morning of Surgery.  2.  If you choose to wash your hair, wash your hair first as usual with your       normal shampoo.  3.  After you shampoo, rinse your hair and body thoroughly to remove the shampoo.  4.  Use CHG as you would any other liquid soap.  You can apply chg directly       to the skin and wash gently with scrungie or a clean washcloth.  5.  Apply the CHG Soap to your body ONLY FROM THE NECK DOWN.        Do not use on open wounds or open sores.  Avoid contact with your eyes, ears, mouth and genitals (private parts).  Wash genitals (private parts) with your normal soap.  6.  Wash thoroughly, paying special attention to the area where your surgery        will be performed.  7.  Thoroughly rinse your body with warm water from the neck down.  8.  DO NOT shower/wash with your normal soap after using and rinsing off       the CHG Soap.  9.  Pat yourself dry with a clean towel.            10.  Wear clean pajamas.            11.  Place clean sheets on your bed the night of your first shower and do not        sleep with pets.  Day of Surgery  Do not apply any lotions/deodeorants the morning of surgery.  Please wear clean clothes to the hospital.   Please read over the fact sheets that you were given.

## 2017-06-03 NOTE — Pre-Procedure Instructions (Signed)
Melissa [name]  06/03/2017      [id number] [place] [place] [address] AT [address] [place] [address] Phone: [phone] Fax: [phone]  [facility] [place] [place] [facility] [address] [place] [address] Phone: [phone] Fax: [phone]   Your procedure is scheduled on Thursday, June 05, 2017 at 11:00 AM.   Report to [facility] Entrance "A" Admitting Office at 9:00 AM.   Call this number if you have problems the morning of surgery: [phone]   Questions prior to day of surgery, please call [phone] between 8 & 4 PM.   Remember:  Do not eat food or drink liquids after midnight Wednesday, 06/04/17.  Take these medicines the morning of surgery with A SIP OF WATER: Tylenol if needed, Prozac, Xopenex inhaler if needed- bring your inhalers with you on the day of surgery, synthroid, Metoprolol (Topral-xl), Pantroprazole (Protonix), Lyrica if needed   Stop Eliquis as instructed by surgeon/physician.  How to Manage Your Diabetes Before Surgery   Why is it important to control my blood sugar before and after surgery?   Improving blood sugar levels before and after surgery helps healing and can limit problems.  A way of improving blood sugar control is eating a healthy diet by:  - Eating less sugar and carbohydrates  - Increasing activity/exercise  - Talk with your doctor about reaching your blood sugar goals  High blood sugars (greater than 180 mg/dL) can raise your risk of infections and slow down your recovery so you will need to focus on controlling your diabetes during the weeks before surgery.  Make sure that the doctor who takes care of your diabetes knows about your planned surgery including the date and location.  How do I manage my blood sugars before surgery?   Check your blood sugar at least 4  times a day, 2 days before surgery to make sure that they are not too high or low.  Check your blood sugar the morning of your surgery when you wake up and every 2 hours until you get to the Short-Stay unit.  Treat a low blood sugar (less than 70 mg/dL) with 1/2 cup of clear juice (cranberry or apple), 4 glucose tablets, OR glucose gel.  Recheck blood sugar in 15 minutes after treatment (to make sure it is greater than 70 mg/dL).  If blood sugar is not greater than 70 mg/dL on re-check, call [phone] for further instructions.   Report your blood sugar to the Short-Stay nurse when you get to Short-Stay.  References:  [facility], 2007 "How to Manage your Diabetes Before and After Surgery".  What do I do about my diabetes medications?   THE NIGHT BEFORE SURGERY, take 11 units of Levemir Insulin.   Do not take other diabetes injectables the day of surgery including Byetta, Victoza, Bydureon, and Trulicity.    Do not wear jewelry, make-up or nail polish.  Do not wear lotions, powders, perfumes or deodorant.  Do not shave 48 hours prior to surgery.    Do not bring valuables to the hospital.  [facility] is not responsible for any belongings or valuables.  Contacts, dentures or bridgework may not be worn into surgery.  Leave your suitcase in the car.  After surgery it may be brought to your room.  For patients admitted to the hospital, discharge  time will be determined by your treatment team.  [facility] - Preparing for Surgery  Before surgery, you can play an important role.  Because skin is not sterile, your skin needs to be as free of germs as possible.  You can reduce the number of germs on you skin by washing with CHG (chlorahexidine gluconate) soap before surgery.  CHG is an antiseptic cleaner which kills germs and bonds with the skin to continue killing germs even after washing.  Please DO NOT use if you have an allergy to CHG or antibacterial  soaps.  If your skin becomes reddened/irritated stop using the CHG and inform your nurse when you arrive at Short Stay.  Do not shave (including legs and underarms) for at least 48 hours prior to the first CHG shower.  You may shave your face.  Please follow these instructions carefully:   1.  Shower with CHG Soap the night before surgery and the                    morning of Surgery.  2.  If you choose to wash your hair, wash your hair first as usual with your       normal shampoo.  3.  After you shampoo, rinse your hair and body thoroughly to remove the shampoo.  4.  Use CHG as you would any other liquid soap.  You can apply chg directly       to the skin and wash gently with scrungie or a clean washcloth.  5.  Apply the CHG Soap to your body ONLY FROM THE NECK DOWN.        Do not use on open wounds or open sores.  Avoid contact with your eyes, ears, mouth and genitals (private parts).  Wash genitals (private parts) with your normal soap.  6.  Wash thoroughly, paying special attention to the area where your surgery        will be performed.  7.  Thoroughly rinse your body with warm water from the neck down.  8.  DO NOT shower/wash with your normal soap after using and rinsing off       the CHG Soap.  9.  Pat yourself dry with a clean towel.            10.  Wear clean pajamas.            11.  Place clean sheets on your bed the night of your first shower and do not        sleep with pets.  Day of Surgery  Do not apply any lotions/deodeorants the morning of surgery.  Please wear clean clothes to the hospital.   Please read over the fact sheets that you were given.

## 2017-06-03 NOTE — Progress Notes (Addendum)
PCP is Dr. [name] Cardiologist is Dr [name]- see note 06-03-17 Moderate overall risk Reports fasting CBG's run 98-140 Denies any chest pain, fever, but reports a cough sometimes in the morning.  Echo noted 10-21-16 Stress noted 11-15-15 Rechecked Heart rate 106 Reports last dose of Eliquis was 05-31-17

## 2017-06-03 NOTE — Telephone Encounter (Signed)
She may proceed with orthopedic surgery with moderate overall cardiovascular risk. Her ejection fraction is low ranging from 15-25% however she does not have any evidence currently of active heart failure symptoms. She is on Eliquis low-dose 2.5 mg twice a day for paroxysmal atrial fibrillation. Hold Eliquis for 2 days prior to procedure, creatinine 1.8. She has had a history of TIA in the past but no stroke on MRI. She is at some increased risk for stroke without anticoagulation. Resume Eliquis when comfortable postoperatively.  [name], MD

## 2017-06-03 NOTE — Telephone Encounter (Signed)
Faxed to Santiago Bur at [phone] as requested. Left message for Margarita Grizzle I faxed information to her and to c/b if any questions or concerns.

## 2017-06-03 NOTE — Telephone Encounter (Signed)
[place] faxed a request for surgical clearance for this pt to have ORIF of right proximal humerus.  She is scheduled for surgery 8/23.  She needs cardiac clearance and to know when to hold Eliquis. We are to fax information to Santiago Bur ASAP at [phone].

## 2017-06-04 MED ORDER — CEFAZOLIN SODIUM-DEXTROSE 2-4 GM/100ML-% IV SOLN
2.0000 g | INTRAVENOUS | Status: AC
  Administered 2017-06-05: 2 g via INTRAVENOUS
  Filled 2017-06-04: qty 100

## 2017-06-04 NOTE — Progress Notes (Addendum)
Anesthesia Chart Review:  Pt is an 83 year old female scheduled for ORIF proximal R humerus fracture on 06/05/2017 with Victorino December, MD  - Cardiologist is [name], MD who cleared pt for surgery.   PMH includes:  PAF, chronic systolic HF, non-ischemic cardiomyopathy, heart murmur, HTN, hyperlipidemia, DM, stroke (30 years ago), asthma, anemia, hypothyroidism, GERD. Former smoker. S/p cholecystectomy 01/24/16.   Medications include: eliquis, lipitor, omnicef (taking for UTI), iron, lasix, levemir, xopenex, levothyroxine, metoprolol, protonix, victoza. Last dose eliquis 05/31/17  BP 92/68   Pulse (!) 112 Comment: notified Trina RN  Temp 36.8 C   Resp 18   Ht 5\' 5"  (1.651 m)   SpO2 97%  - Recheck of HR 106  Preoperative labs reviewed.  Glucose 178. HbA1c was 4.8 on 04/21/17  CXR 08/26/16: No active cardiopulmonary disease.  EKG 11/14/16: NSR.   Echo 10/21/16:  - Left ventricle: LVEF is severely depressed at approximately 15 to 20% The cavity size was normal. Wall thickness was normal. - Mitral valve: Calcified annulus. Mildly thickened leaflets.  Nuclear stress test 11/15/15:  - Normal resting and stress perfusion EF EF 28% most consistent with non ischemic cardiomyopathy  Carotid duplex 11/15/15:  - Findings consistent with 60 - 79 percent stenosis involving the left internal carotid artery. Ratio=3.84 - Evidence 1-39% stenosis in the right Internal carotid artery. Ratio=0.91  Pt was slightly tachycardic at PAT.  If she arrives to holding DOS and is tachycardic, will get EKG to check for afib without rate control and pt will need further assessment by assigned anesthesiologist. Otherwise, if HR normal DOS, I anticipate pt can proceed as scheduled.   Willeen Cass, FNP-BC [facility]/Anesthesiology Phone: [phone] 06/04/2017 9:54 AM

## 2017-06-05 ENCOUNTER — Inpatient Hospital Stay (HOSPITAL_COMMUNITY)
Admission: RE | Admit: 2017-06-05 | Discharge: 2017-06-10 | DRG: 493 | Disposition: A | Discharge status: Skilled Nursing Facility | Payer: Medicare Other | Source: Ambulatory Visit | Attending: Orthopedic Surgery | Admitting: Orthopedic Surgery

## 2017-06-05 ENCOUNTER — Inpatient Hospital Stay (HOSPITAL_COMMUNITY): Payer: Medicare Other

## 2017-06-05 ENCOUNTER — Inpatient Hospital Stay (HOSPITAL_COMMUNITY): Payer: Medicare Other | Admitting: Emergency Medicine

## 2017-06-05 ENCOUNTER — Encounter (HOSPITAL_COMMUNITY): Payer: Self-pay | Admitting: Urology

## 2017-06-05 ENCOUNTER — Inpatient Hospital Stay (HOSPITAL_COMMUNITY): Payer: Medicare Other | Admitting: Anesthesiology

## 2017-06-05 ENCOUNTER — Encounter (HOSPITAL_COMMUNITY)
Admission: RE | Disposition: A | Discharge status: Skilled Nursing Facility | Payer: Self-pay | Source: Ambulatory Visit | Attending: Orthopedic Surgery

## 2017-06-05 DIAGNOSIS — S42201D Unspecified fracture of upper end of right humerus, subsequent encounter for fracture with routine healing: Secondary | ICD-10-CM | POA: Diagnosis not present

## 2017-06-05 DIAGNOSIS — Z79899 Other long term (current) drug therapy: Secondary | ICD-10-CM | POA: Diagnosis not present

## 2017-06-05 DIAGNOSIS — D649 Anemia, unspecified: Secondary | ICD-10-CM | POA: Diagnosis present

## 2017-06-05 DIAGNOSIS — I493 Ventricular premature depolarization: Secondary | ICD-10-CM | POA: Diagnosis present

## 2017-06-05 DIAGNOSIS — D62 Acute posthemorrhagic anemia: Secondary | ICD-10-CM | POA: Diagnosis not present

## 2017-06-05 DIAGNOSIS — R2689 Other abnormalities of gait and mobility: Secondary | ICD-10-CM | POA: Diagnosis not present

## 2017-06-05 DIAGNOSIS — E039 Hypothyroidism, unspecified: Secondary | ICD-10-CM | POA: Diagnosis not present

## 2017-06-05 DIAGNOSIS — I428 Other cardiomyopathies: Secondary | ICD-10-CM

## 2017-06-05 DIAGNOSIS — Z794 Long term (current) use of insulin: Secondary | ICD-10-CM

## 2017-06-05 DIAGNOSIS — Z7901 Long term (current) use of anticoagulants: Secondary | ICD-10-CM

## 2017-06-05 DIAGNOSIS — Z9049 Acquired absence of other specified parts of digestive tract: Secondary | ICD-10-CM

## 2017-06-05 DIAGNOSIS — E7800 Pure hypercholesterolemia, unspecified: Secondary | ICD-10-CM | POA: Diagnosis not present

## 2017-06-05 DIAGNOSIS — K8020 Calculus of gallbladder without cholecystitis without obstruction: Secondary | ICD-10-CM | POA: Diagnosis not present

## 2017-06-05 DIAGNOSIS — E785 Hyperlipidemia, unspecified: Secondary | ICD-10-CM | POA: Diagnosis present

## 2017-06-05 DIAGNOSIS — R400 Somnolence: Secondary | ICD-10-CM | POA: Diagnosis present

## 2017-06-05 DIAGNOSIS — Z809 Family history of malignant neoplasm, unspecified: Secondary | ICD-10-CM

## 2017-06-05 DIAGNOSIS — Z4789 Encounter for other orthopedic aftercare: Secondary | ICD-10-CM | POA: Diagnosis not present

## 2017-06-05 DIAGNOSIS — E1142 Type 2 diabetes mellitus with diabetic polyneuropathy: Secondary | ICD-10-CM | POA: Diagnosis present

## 2017-06-05 DIAGNOSIS — E119 Type 2 diabetes mellitus without complications: Secondary | ICD-10-CM

## 2017-06-05 DIAGNOSIS — R011 Cardiac murmur, unspecified: Secondary | ICD-10-CM | POA: Diagnosis present

## 2017-06-05 DIAGNOSIS — I130 Hypertensive heart and chronic kidney disease with heart failure and stage 1 through stage 4 chronic kidney disease, or unspecified chronic kidney disease: Secondary | ICD-10-CM | POA: Diagnosis not present

## 2017-06-05 DIAGNOSIS — W1830XA Fall on same level, unspecified, initial encounter: Secondary | ICD-10-CM | POA: Diagnosis present

## 2017-06-05 DIAGNOSIS — E118 Type 2 diabetes mellitus with unspecified complications: Secondary | ICD-10-CM

## 2017-06-05 DIAGNOSIS — N183 Chronic kidney disease, stage 3 unspecified: Secondary | ICD-10-CM | POA: Diagnosis present

## 2017-06-05 DIAGNOSIS — K219 Gastro-esophageal reflux disease without esophagitis: Secondary | ICD-10-CM | POA: Diagnosis not present

## 2017-06-05 DIAGNOSIS — Z90710 Acquired absence of both cervix and uterus: Secondary | ICD-10-CM

## 2017-06-05 DIAGNOSIS — Z9841 Cataract extraction status, right eye: Secondary | ICD-10-CM | POA: Diagnosis not present

## 2017-06-05 DIAGNOSIS — Y92009 Unspecified place in unspecified non-institutional (private) residence as the place of occurrence of the external cause: Secondary | ICD-10-CM

## 2017-06-05 DIAGNOSIS — Z961 Presence of intraocular lens: Secondary | ICD-10-CM | POA: Diagnosis present

## 2017-06-05 DIAGNOSIS — Z9842 Cataract extraction status, left eye: Secondary | ICD-10-CM | POA: Diagnosis not present

## 2017-06-05 DIAGNOSIS — Z419 Encounter for procedure for purposes other than remedying health state, unspecified: Secondary | ICD-10-CM

## 2017-06-05 DIAGNOSIS — J45909 Unspecified asthma, uncomplicated: Secondary | ICD-10-CM | POA: Diagnosis present

## 2017-06-05 DIAGNOSIS — Z87891 Personal history of nicotine dependence: Secondary | ICD-10-CM

## 2017-06-05 DIAGNOSIS — I480 Paroxysmal atrial fibrillation: Secondary | ICD-10-CM | POA: Diagnosis not present

## 2017-06-05 DIAGNOSIS — S42201K Unspecified fracture of upper end of right humerus, subsequent encounter for fracture with nonunion: Secondary | ICD-10-CM

## 2017-06-05 DIAGNOSIS — I482 Chronic atrial fibrillation: Secondary | ICD-10-CM | POA: Diagnosis not present

## 2017-06-05 DIAGNOSIS — I10 Essential (primary) hypertension: Secondary | ICD-10-CM | POA: Diagnosis not present

## 2017-06-05 DIAGNOSIS — R5383 Other fatigue: Secondary | ICD-10-CM | POA: Diagnosis present

## 2017-06-05 DIAGNOSIS — Z823 Family history of stroke: Secondary | ICD-10-CM

## 2017-06-05 DIAGNOSIS — I5022 Chronic systolic (congestive) heart failure: Secondary | ICD-10-CM | POA: Diagnosis present

## 2017-06-05 DIAGNOSIS — M6281 Muscle weakness (generalized): Secondary | ICD-10-CM | POA: Diagnosis not present

## 2017-06-05 DIAGNOSIS — T8453XD Infection and inflammatory reaction due to internal right knee prosthesis, subsequent encounter: Secondary | ICD-10-CM | POA: Diagnosis not present

## 2017-06-05 DIAGNOSIS — S42291A Other displaced fracture of upper end of right humerus, initial encounter for closed fracture: Secondary | ICD-10-CM | POA: Diagnosis not present

## 2017-06-05 DIAGNOSIS — Z8249 Family history of ischemic heart disease and other diseases of the circulatory system: Secondary | ICD-10-CM | POA: Diagnosis not present

## 2017-06-05 DIAGNOSIS — F339 Major depressive disorder, recurrent, unspecified: Secondary | ICD-10-CM | POA: Diagnosis not present

## 2017-06-05 DIAGNOSIS — Z8673 Personal history of transient ischemic attack (TIA), and cerebral infarction without residual deficits: Secondary | ICD-10-CM

## 2017-06-05 DIAGNOSIS — M1990 Unspecified osteoarthritis, unspecified site: Secondary | ICD-10-CM | POA: Diagnosis present

## 2017-06-05 DIAGNOSIS — I429 Cardiomyopathy, unspecified: Secondary | ICD-10-CM | POA: Diagnosis present

## 2017-06-05 DIAGNOSIS — K5900 Constipation, unspecified: Secondary | ICD-10-CM | POA: Diagnosis present

## 2017-06-05 DIAGNOSIS — I5023 Acute on chronic systolic (congestive) heart failure: Secondary | ICD-10-CM | POA: Diagnosis not present

## 2017-06-05 DIAGNOSIS — G8911 Acute pain due to trauma: Secondary | ICD-10-CM | POA: Diagnosis not present

## 2017-06-05 DIAGNOSIS — E1122 Type 2 diabetes mellitus with diabetic chronic kidney disease: Secondary | ICD-10-CM | POA: Diagnosis not present

## 2017-06-05 DIAGNOSIS — S42309A Unspecified fracture of shaft of humerus, unspecified arm, initial encounter for closed fracture: Secondary | ICD-10-CM | POA: Diagnosis not present

## 2017-06-05 DIAGNOSIS — R278 Other lack of coordination: Secondary | ICD-10-CM | POA: Diagnosis not present

## 2017-06-05 DIAGNOSIS — D631 Anemia in chronic kidney disease: Secondary | ICD-10-CM | POA: Diagnosis not present

## 2017-06-05 DIAGNOSIS — W19XXXA Unspecified fall, initial encounter: Secondary | ICD-10-CM | POA: Diagnosis not present

## 2017-06-05 DIAGNOSIS — G8918 Other acute postprocedural pain: Secondary | ICD-10-CM | POA: Diagnosis not present

## 2017-06-05 DIAGNOSIS — S42201A Unspecified fracture of upper end of right humerus, initial encounter for closed fracture: Principal | ICD-10-CM | POA: Diagnosis present

## 2017-06-05 DIAGNOSIS — Z5181 Encounter for therapeutic drug level monitoring: Secondary | ICD-10-CM | POA: Diagnosis not present

## 2017-06-05 HISTORY — DX: Type 2 diabetes mellitus without complications: E11.9

## 2017-06-05 HISTORY — PX: ORIF HUMERUS FRACTURE: SHX2126

## 2017-06-05 LAB — GLUCOSE, CAPILLARY
GLUCOSE-CAPILLARY: 229 mg/dL — AB (ref 65–99)
GLUCOSE-CAPILLARY: 143 mg/dL — AB (ref 65–99)
Glucose-Capillary: 135 mg/dL — ABNORMAL HIGH (ref 65–99)
Glucose-Capillary: 140 mg/dL — ABNORMAL HIGH (ref 65–99)

## 2017-06-05 LAB — HEMOGLOBIN A1C
Hgb A1c MFr Bld: 5.3 % (ref 4.8–5.6)
Mean Plasma Glucose: 105.41 mg/dL

## 2017-06-05 LAB — PROTIME-INR
INR: 0.97
Prothrombin Time: 12.9 seconds (ref 11.4–15.2)

## 2017-06-05 SURGERY — OPEN REDUCTION INTERNAL FIXATION (ORIF) PROXIMAL HUMERUS FRACTURE
Anesthesia: General | Site: Shoulder | Laterality: Right

## 2017-06-05 MED ORDER — DEXAMETHASONE SODIUM PHOSPHATE 10 MG/ML IJ SOLN
INTRAMUSCULAR | Status: DC | PRN
  Administered 2017-06-05: 5 mg via INTRAVENOUS

## 2017-06-05 MED ORDER — METOPROLOL SUCCINATE ER 25 MG PO TB24
25.0000 mg | ORAL_TABLET | Freq: Every day | ORAL | Status: DC
  Administered 2017-06-06 – 2017-06-08 (×2): 25 mg via ORAL
  Filled 2017-06-05 (×3): qty 1

## 2017-06-05 MED ORDER — VITAMIN D 1000 UNITS PO TABS
1000.0000 [IU] | ORAL_TABLET | Freq: Every day | ORAL | Status: DC
  Administered 2017-06-06 – 2017-06-10 (×5): 1000 [IU] via ORAL
  Filled 2017-06-05 (×5): qty 1

## 2017-06-05 MED ORDER — MIDAZOLAM HCL 2 MG/2ML IJ SOLN
INTRAMUSCULAR | Status: AC
  Filled 2017-06-05: qty 2

## 2017-06-05 MED ORDER — ATORVASTATIN CALCIUM 40 MG PO TABS
40.0000 mg | ORAL_TABLET | Freq: Every day | ORAL | Status: DC
  Administered 2017-06-05 – 2017-06-10 (×6): 40 mg via ORAL
  Filled 2017-06-05 (×6): qty 1

## 2017-06-05 MED ORDER — METHOCARBAMOL 1000 MG/10ML IJ SOLN
500.0000 mg | Freq: Four times a day (QID) | INTRAMUSCULAR | Status: DC | PRN
  Filled 2017-06-05: qty 5

## 2017-06-05 MED ORDER — ONDANSETRON HCL 4 MG/2ML IJ SOLN
INTRAMUSCULAR | Status: DC | PRN
  Administered 2017-06-05: 4 mg via INTRAVENOUS

## 2017-06-05 MED ORDER — FERROUS SULFATE 325 (65 FE) MG PO TABS
325.0000 mg | ORAL_TABLET | Freq: Two times a day (BID) | ORAL | Status: DC
  Administered 2017-06-05 – 2017-06-06 (×3): 325 mg via ORAL
  Filled 2017-06-05 (×3): qty 1

## 2017-06-05 MED ORDER — MORPHINE SULFATE (PF) 4 MG/ML IV SOLN
2.0000 mg | INTRAVENOUS | Status: DC | PRN
  Administered 2017-06-06: 2 mg via INTRAVENOUS
  Filled 2017-06-05: qty 1

## 2017-06-05 MED ORDER — INSULIN DETEMIR 100 UNIT/ML ~~LOC~~ SOLN
12.0000 [IU] | Freq: Every day | SUBCUTANEOUS | Status: DC
  Administered 2017-06-06 – 2017-06-10 (×5): 12 [IU] via SUBCUTANEOUS
  Filled 2017-06-05 (×5): qty 0.12

## 2017-06-05 MED ORDER — SENNA 8.6 MG PO TABS
1.0000 | ORAL_TABLET | Freq: Two times a day (BID) | ORAL | Status: DC
  Administered 2017-06-05 – 2017-06-09 (×9): 8.6 mg via ORAL
  Filled 2017-06-05 (×10): qty 1

## 2017-06-05 MED ORDER — FLUOXETINE HCL 20 MG PO CAPS
20.0000 mg | ORAL_CAPSULE | Freq: Every day | ORAL | Status: DC
  Administered 2017-06-06 – 2017-06-10 (×5): 20 mg via ORAL
  Filled 2017-06-05 (×5): qty 1

## 2017-06-05 MED ORDER — DEXAMETHASONE SODIUM PHOSPHATE 10 MG/ML IJ SOLN
INTRAMUSCULAR | Status: AC
  Filled 2017-06-05: qty 1

## 2017-06-05 MED ORDER — CYANOCOBALAMIN 1000 MCG/ML IJ SOLN: 1000.0000 ug | INTRAMUSCULAR | Status: DC

## 2017-06-05 MED ORDER — PROPOFOL 10 MG/ML IV BOLUS
INTRAVENOUS | Status: AC
  Filled 2017-06-05: qty 20

## 2017-06-05 MED ORDER — EPHEDRINE 5 MG/ML INJ
INTRAVENOUS | Status: AC
  Filled 2017-06-05: qty 10

## 2017-06-05 MED ORDER — SUGAMMADEX SODIUM 200 MG/2ML IV SOLN
INTRAVENOUS | Status: DC | PRN
  Administered 2017-06-05: 140.6 mg via INTRAVENOUS

## 2017-06-05 MED ORDER — TRAZODONE HCL 100 MG PO TABS
100.0000 mg | ORAL_TABLET | Freq: Every day | ORAL | Status: DC
  Administered 2017-06-05 – 2017-06-09 (×5): 100 mg via ORAL
  Filled 2017-06-05 (×5): qty 1

## 2017-06-05 MED ORDER — OXYCODONE HCL 5 MG/5ML PO SOLN: 5.0000 mg | Freq: Once | ORAL | Status: DC | PRN

## 2017-06-05 MED ORDER — ALBUTEROL SULFATE (2.5 MG/3ML) 0.083% IN NEBU
2.5000 mg | INHALATION_SOLUTION | Freq: Four times a day (QID) | RESPIRATORY_TRACT | Status: DC | PRN
Start: 2017-06-05 — End: 2017-06-10
  Filled 2017-06-05: qty 3

## 2017-06-05 MED ORDER — CHLORHEXIDINE GLUCONATE 4 % EX LIQD: 60.0000 mL | Freq: Once | CUTANEOUS | Status: DC

## 2017-06-05 MED ORDER — PHENYLEPHRINE HCL 10 MG/ML IJ SOLN
INTRAVENOUS | Status: DC | PRN
  Administered 2017-06-05: 80 ug/min via INTRAVENOUS

## 2017-06-05 MED ORDER — ONDANSETRON HCL 4 MG/2ML IJ SOLN: 4.0000 mg | Freq: Four times a day (QID) | INTRAMUSCULAR | Status: DC | PRN

## 2017-06-05 MED ORDER — LIDOCAINE 2% (20 MG/ML) 5 ML SYRINGE
INTRAMUSCULAR | Status: DC | PRN
  Administered 2017-06-05: 60 mg via INTRAVENOUS

## 2017-06-05 MED ORDER — ROCURONIUM BROMIDE 10 MG/ML (PF) SYRINGE
PREFILLED_SYRINGE | INTRAVENOUS | Status: AC
  Filled 2017-06-05: qty 5

## 2017-06-05 MED ORDER — SUGAMMADEX SODIUM 200 MG/2ML IV SOLN
INTRAVENOUS | Status: AC
  Filled 2017-06-05: qty 2

## 2017-06-05 MED ORDER — CEFUROXIME AXETIL 500 MG PO TABS
500.0000 mg | ORAL_TABLET | Freq: Two times a day (BID) | ORAL | Status: DC
  Administered 2017-06-05 – 2017-06-10 (×10): 500 mg via ORAL
  Filled 2017-06-05 (×10): qty 1

## 2017-06-05 MED ORDER — PANTOPRAZOLE SODIUM 40 MG PO TBEC
40.0000 mg | DELAYED_RELEASE_TABLET | Freq: Every day | ORAL | Status: DC
  Administered 2017-06-06 – 2017-06-10 (×5): 40 mg via ORAL
  Filled 2017-06-05 (×5): qty 1

## 2017-06-05 MED ORDER — OXYCODONE HCL 5 MG PO TABS
5.0000 mg | ORAL_TABLET | ORAL | Status: DC | PRN
  Administered 2017-06-06 (×4): 10 mg via ORAL
  Filled 2017-06-05 (×4): qty 2

## 2017-06-05 MED ORDER — 0.9 % SODIUM CHLORIDE (POUR BTL) OPTIME
TOPICAL | Status: DC | PRN
  Administered 2017-06-05: 1000 mL

## 2017-06-05 MED ORDER — ONDANSETRON HCL 4 MG/2ML IJ SOLN
INTRAMUSCULAR | Status: AC
  Filled 2017-06-05: qty 2

## 2017-06-05 MED ORDER — ROCURONIUM BROMIDE 10 MG/ML (PF) SYRINGE
PREFILLED_SYRINGE | INTRAVENOUS | Status: DC | PRN
  Administered 2017-06-05: 50 mg via INTRAVENOUS
  Administered 2017-06-05: 10 mg via INTRAVENOUS

## 2017-06-05 MED ORDER — FENTANYL CITRATE (PF) 100 MCG/2ML IJ SOLN
INTRAMUSCULAR | Status: AC
  Administered 2017-06-05: 50 ug via INTRAVENOUS
  Filled 2017-06-05: qty 2

## 2017-06-05 MED ORDER — INSULIN ASPART 100 UNIT/ML ~~LOC~~ SOLN
0.0000 [IU] | Freq: Three times a day (TID) | SUBCUTANEOUS | Status: DC
  Administered 2017-06-06: 3 [IU] via SUBCUTANEOUS
  Administered 2017-06-07 – 2017-06-09 (×5): 2 [IU] via SUBCUTANEOUS
  Administered 2017-06-09: 3 [IU] via SUBCUTANEOUS
  Administered 2017-06-10 (×2): 2 [IU] via SUBCUTANEOUS

## 2017-06-05 MED ORDER — APIXABAN 2.5 MG PO TABS
2.5000 mg | ORAL_TABLET | Freq: Two times a day (BID) | ORAL | Status: DC
  Administered 2017-06-06 – 2017-06-07 (×3): 2.5 mg via ORAL
  Filled 2017-06-05 (×3): qty 1

## 2017-06-05 MED ORDER — PRAMIPEXOLE DIHYDROCHLORIDE 0.125 MG PO TABS
0.5000 mg | ORAL_TABLET | Freq: Every day | ORAL | Status: DC
  Administered 2017-06-05 – 2017-06-09 (×5): 0.5 mg via ORAL
  Filled 2017-06-05 (×5): qty 4

## 2017-06-05 MED ORDER — ACETAMINOPHEN 325 MG PO TABS
650.0000 mg | ORAL_TABLET | Freq: Four times a day (QID) | ORAL | Status: DC | PRN
Start: 2017-06-05 — End: 2017-06-10
  Administered 2017-06-06 – 2017-06-08 (×4): 650 mg via ORAL
  Filled 2017-06-05 (×4): qty 2

## 2017-06-05 MED ORDER — INSULIN ASPART 100 UNIT/ML ~~LOC~~ SOLN
0.0000 [IU] | Freq: Three times a day (TID) | SUBCUTANEOUS | Status: DC
  Administered 2017-06-05: 3 [IU] via SUBCUTANEOUS
  Administered 2017-06-06: 1 [IU] via SUBCUTANEOUS

## 2017-06-05 MED ORDER — INSULIN ASPART 100 UNIT/ML ~~LOC~~ SOLN
0.0000 [IU] | Freq: Every day | SUBCUTANEOUS | Status: DC
  Administered 2017-06-07: 2 [IU] via SUBCUTANEOUS

## 2017-06-05 MED ORDER — PHENYLEPHRINE 40 MCG/ML (10ML) SYRINGE FOR IV PUSH (FOR BLOOD PRESSURE SUPPORT)
PREFILLED_SYRINGE | INTRAVENOUS | Status: DC | PRN
  Administered 2017-06-05 (×2): 160 ug via INTRAVENOUS

## 2017-06-05 MED ORDER — FENTANYL CITRATE (PF) 100 MCG/2ML IJ SOLN
50.0000 ug | Freq: Once | INTRAMUSCULAR | Status: AC
  Administered 2017-06-05: 50 ug via INTRAVENOUS

## 2017-06-05 MED ORDER — BUPIVACAINE-EPINEPHRINE (PF) 0.5% -1:200000 IJ SOLN
INTRAMUSCULAR | Status: DC | PRN
  Administered 2017-06-05: 30 mL via PERINEURAL

## 2017-06-05 MED ORDER — FENTANYL CITRATE (PF) 100 MCG/2ML IJ SOLN: 25.0000 ug | INTRAMUSCULAR | Status: DC | PRN

## 2017-06-05 MED ORDER — PROPOFOL 10 MG/ML IV BOLUS
INTRAVENOUS | Status: DC | PRN
  Administered 2017-06-05: 100 mg via INTRAVENOUS

## 2017-06-05 MED ORDER — EPHEDRINE SULFATE-NACL 50-0.9 MG/10ML-% IV SOSY
PREFILLED_SYRINGE | INTRAVENOUS | Status: DC | PRN
  Administered 2017-06-05: 10 mg via INTRAVENOUS

## 2017-06-05 MED ORDER — LACTATED RINGERS IV SOLN
INTRAVENOUS | Status: DC
  Administered 2017-06-05: 10:00:00 via INTRAVENOUS

## 2017-06-05 MED ORDER — METHOCARBAMOL 500 MG PO TABS
500.0000 mg | ORAL_TABLET | Freq: Four times a day (QID) | ORAL | Status: DC | PRN
  Administered 2017-06-06 – 2017-06-08 (×6): 500 mg via ORAL
  Filled 2017-06-05 (×6): qty 1

## 2017-06-05 MED ORDER — OXYCODONE HCL 5 MG PO TABS: 5.0000 mg | ORAL_TABLET | Freq: Once | ORAL | Status: DC | PRN

## 2017-06-05 MED ORDER — ACETAMINOPHEN 650 MG RE SUPP: 650.0000 mg | Freq: Four times a day (QID) | RECTAL | Status: DC | PRN

## 2017-06-05 MED ORDER — INSULIN ASPART 100 UNIT/ML ~~LOC~~ SOLN: 0.0000 [IU] | Freq: Every day | SUBCUTANEOUS | Status: DC

## 2017-06-05 MED ORDER — LIDOCAINE 2% (20 MG/ML) 5 ML SYRINGE
INTRAMUSCULAR | Status: AC
  Filled 2017-06-05: qty 5

## 2017-06-05 MED ORDER — OMEGA-3-ACID ETHYL ESTERS 1 G PO CAPS
1.0000 g | ORAL_CAPSULE | Freq: Every day | ORAL | Status: DC
  Administered 2017-06-06 – 2017-06-10 (×5): 1 g via ORAL
  Filled 2017-06-05 (×5): qty 1

## 2017-06-05 MED ORDER — LEVOTHYROXINE SODIUM 50 MCG PO TABS
50.0000 ug | ORAL_TABLET | Freq: Every day | ORAL | Status: DC
  Administered 2017-06-06 – 2017-06-10 (×5): 50 ug via ORAL
  Filled 2017-06-05 (×5): qty 1

## 2017-06-05 MED ORDER — PHENYLEPHRINE 40 MCG/ML (10ML) SYRINGE FOR IV PUSH (FOR BLOOD PRESSURE SUPPORT)
PREFILLED_SYRINGE | INTRAVENOUS | Status: AC
  Filled 2017-06-05: qty 10

## 2017-06-05 MED ORDER — BENZONATATE 100 MG PO CAPS: 200.0000 mg | ORAL_CAPSULE | Freq: Three times a day (TID) | ORAL | Status: DC | PRN

## 2017-06-05 MED ORDER — PREGABALIN 50 MG PO CAPS
50.0000 mg | ORAL_CAPSULE | Freq: Three times a day (TID) | ORAL | Status: DC
  Administered 2017-06-05 – 2017-06-10 (×14): 50 mg via ORAL
  Filled 2017-06-05 (×14): qty 1

## 2017-06-05 SURGICAL SUPPLY — 69 items
BIT DRILL 3.2 (BIT) ×2
BIT DRILL 3.2XCALB NS DISP (BIT) IMPLANT
BIT DRILL 5/64X5 DISP (BIT) ×1 IMPLANT
BIT DRILL CALIBRATED 2.7 (BIT) ×1 IMPLANT
BIT DRL 3.2XCALB NS DISP (BIT) ×1
COVER SURGICAL LIGHT HANDLE (MISCELLANEOUS) ×2 IMPLANT
DRAPE INCISE IOBAN 66X45 STRL (DRAPES) ×2 IMPLANT
DRAPE ORTHO SPLIT 77X108 STRL (DRAPES) ×4
DRAPE SURG ORHT 6 SPLT 77X108 (DRAPES) ×2 IMPLANT
DRAPE U-SHAPE 47X51 STRL (DRAPES) ×2 IMPLANT
DRSG ADAPTIC 3X8 NADH LF (GAUZE/BANDAGES/DRESSINGS) ×1 IMPLANT
DRSG AQUACEL AG ADV 3.5X10 (GAUZE/BANDAGES/DRESSINGS) ×1 IMPLANT
DRSG PAD ABDOMINAL 8X10 ST (GAUZE/BANDAGES/DRESSINGS) ×1 IMPLANT
DURAPREP 26ML APPLICATOR (WOUND CARE) ×2 IMPLANT
ELECT BLADE 4.0 EZ CLEAN MEGAD (MISCELLANEOUS) ×2
ELECT REM PT RETURN 9FT ADLT (ELECTROSURGICAL) ×2
ELECTRODE BLDE 4.0 EZ CLN MEGD (MISCELLANEOUS) ×1 IMPLANT
ELECTRODE REM PT RTRN 9FT ADLT (ELECTROSURGICAL) ×1 IMPLANT
GAUZE SPONGE 4X4 12PLY STRL (GAUZE/BANDAGES/DRESSINGS) ×1 IMPLANT
GLOVE BIO SURGEON STRL SZ7.5 (GLOVE) ×2 IMPLANT
GLOVE BIOGEL PI IND STRL 8 (GLOVE) ×1 IMPLANT
GLOVE BIOGEL PI INDICATOR 8 (GLOVE) ×1
GOWN STRL REUS W/ TWL LRG LVL3 (GOWN DISPOSABLE) ×1 IMPLANT
GOWN STRL REUS W/ TWL XL LVL3 (GOWN DISPOSABLE) ×2 IMPLANT
GOWN STRL REUS W/TWL LRG LVL3 (GOWN DISPOSABLE) ×2
GOWN STRL REUS W/TWL XL LVL3 (GOWN DISPOSABLE) ×4
K-WIRE 2X5 SS THRDED S3 (WIRE) ×2
KIT BASIN OR (CUSTOM PROCEDURE TRAY) ×2 IMPLANT
KIT BEACH CHAIR TRIMANO (MISCELLANEOUS) IMPLANT
KIT ROOM TURNOVER OR (KITS) ×2 IMPLANT
KWIRE 2X5 SS THRDED S3 (WIRE) IMPLANT
MANIFOLD NEPTUNE II (INSTRUMENTS) ×2 IMPLANT
NDL 1/2 CIR MAYO (NEEDLE) ×1 IMPLANT
NDL HYPO 25GX1X1/2 BEV (NEEDLE) ×1 IMPLANT
NEEDLE 1/2 CIR MAYO (NEEDLE) ×2 IMPLANT
NEEDLE HYPO 25GX1X1/2 BEV (NEEDLE) IMPLANT
NS IRRIG 1000ML POUR BTL (IV SOLUTION) ×2 IMPLANT
PACK SHOULDER (CUSTOM PROCEDURE TRAY) ×2 IMPLANT
PAD ARMBOARD 7.5X6 YLW CONV (MISCELLANEOUS) ×4 IMPLANT
PEG LOCKING 3.2MMX26MM (Peg) ×2 IMPLANT
PEG LOCKING 3.2X32 (Peg) ×1 IMPLANT
PEG LOCKING 3.2X36 (Screw) ×1 IMPLANT
PEG LOCKING 3.2X38 (Screw) ×2 IMPLANT
PEG LOCKING 3.2X50 (Screw) ×1 IMPLANT
PEG LOCKING 3.2X52 (Peg) ×1 IMPLANT
PLATE 3HOLE HUMERUS PROX RT (Plate) ×1 IMPLANT
SCREW LP NL T15 3.5X24 (Screw) ×3 IMPLANT
SLEEVE MEASURING 3.2 (BIT) ×1 IMPLANT
SLING ARM FOAM STRAP LRG (SOFTGOODS) ×1 IMPLANT
SLING ARM FOAM STRAP MED (SOFTGOODS) IMPLANT
SPONGE LAP 18X18 X RAY DECT (DISPOSABLE) IMPLANT
SPONGE LAP 4X18 X RAY DECT (DISPOSABLE) ×2 IMPLANT
STRIP CLOSURE SKIN 1/2X4 (GAUZE/BANDAGES/DRESSINGS) ×2 IMPLANT
SUCTION FRAZIER HANDLE 10FR (MISCELLANEOUS) ×1
SUCTION TUBE FRAZIER 10FR DISP (MISCELLANEOUS) ×1 IMPLANT
SUT FIBERWIRE #2 38 T-5 BLUE (SUTURE) ×4
SUT MAXBRAID (SUTURE) ×1 IMPLANT
SUT MNCRL AB 4-0 PS2 18 (SUTURE) ×2 IMPLANT
SUT SILK 2 0 TIES 10X30 (SUTURE) ×1 IMPLANT
SUT VIC AB 0 CT1 27 (SUTURE) ×4
SUT VIC AB 0 CT1 27XBRD ANBCTR (SUTURE) IMPLANT
SUT VIC AB 2-0 CT1 27 (SUTURE) ×4
SUT VIC AB 2-0 CT1 TAPERPNT 27 (SUTURE) ×1 IMPLANT
SUTURE FIBERWR #2 38 T-5 BLUE (SUTURE) ×2 IMPLANT
SYR CONTROL 10ML LL (SYRINGE) ×2 IMPLANT
TOWEL OR 17X24 6PK STRL BLUE (TOWEL DISPOSABLE) ×2 IMPLANT
TOWEL OR 17X26 10 PK STRL BLUE (TOWEL DISPOSABLE) ×2 IMPLANT
WATER STERILE IRR 1000ML POUR (IV SOLUTION) ×1 IMPLANT
YANKAUER SUCT BULB TIP NO VENT (SUCTIONS) ×2 IMPLANT
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
## 2017-06-05 NOTE — Anesthesia Procedure Notes (Signed)
Procedure Name: Intubation Date/Time: 06/05/2017 11:15 AM Performed by: Freddie Breech Pre-anesthesia Checklist: Patient identified, Emergency Drugs available, Suction available and Patient being monitored Patient Re-evaluated:Patient Re-evaluated prior to induction Oxygen Delivery Method: Circle System Utilized Preoxygenation: Pre-oxygenation with 100% oxygen Induction Type: IV induction Ventilation: Mask ventilation without difficulty Laryngoscope Size: Mac and 3 Grade View: Grade I Tube type: Oral Tube size: 7.0 mm Number of attempts: 1 Airway Equipment and Method: Stylet and Oral airway Placement Confirmation: ETT inserted through vocal cords under direct vision,  positive ETCO2 and breath sounds checked- equal and bilateral Secured at: 21 cm Tube secured with: Tape Dental Injury: Teeth and Oropharynx as per pre-operative assessment

## 2017-06-05 NOTE — Transfer of Care (Signed)
Immediate Anesthesia Transfer of Care Note  Patient: Melissa [name]  Procedure(s) Performed: Procedure(s): OPEN REDUCTION INTERNAL FIXATION (ORIF) PROXIMAL HUMERUS FRACTURE (Right)  Patient Location: PACU  Anesthesia Type:GA combined with regional for post-op pain  Level of Consciousness: drowsy and patient cooperative  Airway & Oxygen Therapy: Patient Spontanous Breathing and Patient connected to face mask oxygen  Post-op Assessment: Report given to RN and Post -op Vital signs reviewed and stable  Post vital signs: Reviewed and stable  Last Vitals:  Vitals:   06/05/17 1045 06/05/17 1306  BP: (!) 114/48   Pulse: 93   Resp: 20   Temp:  36.4 C  SpO2: 99%     Last Pain:  Vitals:   06/05/17 0927  TempSrc: Oral         Complications: No apparent anesthesia complications

## 2017-06-05 NOTE — Op Note (Signed)
06/05/2017  3:48 PM  PATIENT:  Melissa [name]    PRE-OPERATIVE DIAGNOSIS:  Right proximal humerus fracture  POST-OPERATIVE DIAGNOSIS:  Same  PROCEDURE:   1. OPEN REDUCTION INTERNAL FIXATION (ORIF) PROXIMAL HUMERUS FRACTURE 2.  Transfer long head biceps tendon to pectoralis major tendon.   SURGEON:  Nicholes Stairs, MD  ASSISTANT:  Ky Barban, RNFA  ANESTHESIA:   General  PREOPERATIVE INDICATIONS:  Melissa [name] is a Right-handed  83 y.o. female with a diagnosis of Right proximal humerus fracture who elected for surgical management.  Melissa [name] had an unfortunate accident at home where she was climbing onto her washing machine and fell onto her right shoulder.  She was found in the emergency department to have a closed right proximal humerus fracture.  She presented to my office for recommendations on treatment strategy.  We discussed the option of nonoperative management however based on the anterior displacement of the shaft fragment and the prominence on her anterior shoulder on exam we are concerned for this piece being stuck in the deltoid muscle.  Therefore we did elect to recommend open reduction and internal fixation.  She was in agreement to this as were her family members at that time.  The risks benefits and alternatives were discussed with the patient including but not limited to the risks of nonoperative treatment, versus surgical intervention including infection, bleeding, nerve injury, malunion, nonunion, the need for revision surgery, hardware prominence, hardware failure, the need for hardware removal, blood clots, cardiopulmonary complications, conversion to arthroplasty, morbidity, mortality, among others, and they were willing to proceed.  Predicted outcome is good, although there will be at least a six to nine month expected recovery.   OPERATIVE IMPLANTS: Biomet ALPS proximal humerus locking plate.  OPERATIVE FINDINGS: Displaced proximal humerus  fracture.  UNIQUE ASPECTS OF THE CASE:  The long head biceps tendon was draped across the fracture site and under quite a bit of stress after we entered the deltopectoral interval.  We elected to proceed with long head biceps tendon transfer to the pectoralis major based on that finding and likelihood of developing pain.  Also of note the shaft fragment was displaced anteriorly and had complete penetrated through the deltoid anterior muscle belly.  The bony tip was sitting in the subcutaneous space.There were no concomitant tuberosity fractures noted.  There were no rotator cuff tears noted.  OPERATIVE PROCEDURE: The patient was brought to the operating room and placed in the supine position. General anesthesia was administered. IV antibiotics were given. She was placed in the beach chair position. All bony prominences were padded. The upper extremity was prepped and draped in usual sterile fashion. Deltopectoral incision was performed.  Dissection was carried down from skin through subcu tissues tissue to the deltoid fascia.  At that level it was noted that the shaft piece was penetrating through the anterior deltoid muscle.  We did extract the piece from that muscle and reduced it back into the subdeltoid space.  Formal dissection was then carried down through deltopectoral interval beginning at the coracoid process and moving distally to the pec tendon attachment to the proximal humerus.  We did at this point proceed with long head biceps tenodesis via transfer to the pectoralis major tendon.  2 figure-of-eight stitches were placed from the long head biceps tendon into the upper border of the pectoralis major tendon with #2 FiberWire.  The residual tendon was tenotomized just proximal to that.  We then utilized the curved Mayo scissor to  open the rotator interval proximally and entered the glenohumeral joint.  The biceps was then tenotomized from the superior labrum with the Mayo scissors.  Next the  clavipectoral fascia was opened up to allow mobilization of the conjoined tendon.  I exposed the fracture site, and placed deep retractors.  I elevated a small portion of the deltoid off of the shaft, in order to gain access for the plate. I placed supraspinatus and subscapularis stitches, and then reduced the head onto the shaft. This was maintained in satisfactory position.  I applied the plate and secured it into the sliding hole first. I confirmed position of the reduction and the plate with C-arm, and I placed a total of 2 guidewires into the appropriate position in the head. I was satisfied that the plate was distal appropriately, and then secured the plate proximally with smooth pegs, taking care to prevent penetration into the arch articular surface, using C-arm, as well as manual feel using a hand drill.  I then secured the plate distally using another cortical screw, For a total of 3 cortical shaft screws.  I then passed the FiberWire sutures from the subscapularis and supraspinatus through the plate and secured the tuberosities/Rotator cuff muscles to neutralize force across the fracture.  Once complete fixation and reduction of been achieved, took final C-arm pictures, and irrigated the wounds copiously, and repaired the deltopectoral interval with Vicryl followed by Vicryl for the subcutaneous tissue with Monocryl and Steri-Strips for the skin. She was placed in a sling. She had a preoperative regional block as well. She tolerated the procedure well with no complications.  All counts were correct 2.   Disposition:  Melissa [name] will be nonweightbearing to her right upper extremity in a sling.  She may participate in full active and passive range of motion of the elbow hand and wrist immediately as well as pendulums and scapular retractions at the glenohumeral joint.  She will return to use of her Eliquis starting tomorrow which will serve also as DVT prophylaxis.  She will be admitted to the  inpatient service with a hospitalist following along to assist with medical management and perioperative care.
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
## 2017-06-05 NOTE — Anesthesia Procedure Notes (Signed)
Anesthesia Regional Block: Interscalene brachial plexus block   Pre-Anesthetic Checklist: ,, timeout performed, Correct Patient, Correct Site, Correct Laterality, Correct Procedure, Correct Position, site marked, Risks and benefits discussed,  Surgical consent,  Pre-op evaluation,  At surgeon's request and post-op pain management  Laterality: Right  Prep: chloraprep       Needles:  Injection technique: Single-shot  Needle Type: Echogenic Stimulator Needle     Needle Length: 5cm  Needle Gauge: 22     Additional Needles:   Procedures: ultrasound guided, nerve stimulator,,,,,,   Nerve Stimulator or Paresthesia:  Response: biceps flexion, 0.45 mA,   Additional Responses:   Narrative:  Start time: 06/05/2017 10:34 AM End time: 06/05/2017 11:42 AM Injection made incrementally with aspirations every 5 mL.  Performed by: Personally  Anesthesiologist: Airik Goodlin  Additional Notes: Functioning IV was confirmed and monitors were applied.  A 47mm 22ga Arrow echogenic stimulator needle was used. Sterile prep and drape,hand hygiene and sterile gloves were used.  Negative aspiration and negative test dose prior to incremental administration of local anesthetic. The patient tolerated the procedure well.  Ultrasound guidance: relevent anatomy identified, needle position confirmed, local anesthetic spread visualized around nerve(s), vascular puncture avoided.  Image printed for medical record.

## 2017-06-05 NOTE — Brief Op Note (Signed)
06/05/2017  1:02 PM  PATIENT:  Melissa [name]  83 y.o. female  PRE-OPERATIVE DIAGNOSIS:  Right proximal humerus fracture  POST-OPERATIVE DIAGNOSIS:  Right proximal humerus fracture  PROCEDURE:  Procedure(s): OPEN REDUCTION INTERNAL FIXATION (ORIF) PROXIMAL HUMERUS FRACTURE (Right)  SURGEON:  Surgeon(s) and Role:    * Nicholes Stairs, MD - Primary  PHYSICIAN ASSISTANT:   ASSISTANTS: Ky Barban, RNFA   ANESTHESIA:   regional and general  EBL:  Total I/O In: -  Out: 200 [Blood:200]  BLOOD ADMINISTERED:none  DRAINS: none   LOCAL MEDICATIONS USED:  NONE  SPECIMEN:  No Specimen  DISPOSITION OF SPECIMEN:  N/A  COUNTS:  YES  TOURNIQUET:  * No tourniquets in log *  DICTATION: .Note written in EPIC  PLAN OF CARE: Admit to inpatient   PATIENT DISPOSITION:  PACU - hemodynamically stable.   Delay start of Pharmacological VTE agent (>24hrs) due to surgical blood loss or risk of bleeding: not applicable

## 2017-06-05 NOTE — Anesthesia Preprocedure Evaluation (Signed)
Anesthesia Evaluation  Patient identified by MRN, date of birth, ID band Patient awake    Reviewed: Allergy & Precautions, H&P , NPO status , Patient's Chart, lab work & pertinent test results  Airway Mallampati: II   Neck ROM: full    Dental   Pulmonary shortness of breath, asthma , former smoker,    breath sounds clear to auscultation       Cardiovascular hypertension, +CHF  + dysrhythmias Atrial Fibrillation  Rhythm:regular Rate:Normal     Neuro/Psych  Headaches, PSYCHIATRIC DISORDERS Anxiety  Neuromuscular disease CVA    GI/Hepatic GERD  ,  Endo/Other  diabetes, Type 2Hypothyroidism   Renal/GU Renal InsufficiencyRenal disease     Musculoskeletal  (+) Arthritis ,   Abdominal   Peds  Hematology   Anesthesia Other Findings   Reproductive/Obstetrics                             Anesthesia Physical Anesthesia Plan  ASA: III  Anesthesia Plan: General   Post-op Pain Management:  Regional for Post-op pain   Induction: Intravenous  PONV Risk Score and Plan: 3 and Ondansetron, Dexamethasone and Treatment may vary due to age or medical condition  Airway Management Planned: Oral ETT  Additional Equipment:   Intra-op Plan:   Post-operative Plan: Extubation in OR  Informed Consent: I have reviewed the patients History and Physical, chart, labs and discussed the procedure including the risks, benefits and alternatives for the proposed anesthesia with the patient or authorized representative who has indicated his/her understanding and acceptance.     Plan Discussed with: CRNA, Anesthesiologist and Surgeon  Anesthesia Plan Comments:         Anesthesia Quick Evaluation

## 2017-06-06 ENCOUNTER — Encounter (HOSPITAL_COMMUNITY): Payer: Self-pay | Admitting: Orthopedic Surgery

## 2017-06-06 DIAGNOSIS — Z794 Long term (current) use of insulin: Secondary | ICD-10-CM

## 2017-06-06 DIAGNOSIS — E039 Hypothyroidism, unspecified: Secondary | ICD-10-CM

## 2017-06-06 DIAGNOSIS — E1122 Type 2 diabetes mellitus with diabetic chronic kidney disease: Secondary | ICD-10-CM

## 2017-06-06 DIAGNOSIS — S42201D Unspecified fracture of upper end of right humerus, subsequent encounter for fracture with routine healing: Secondary | ICD-10-CM

## 2017-06-06 LAB — BASIC METABOLIC PANEL
Anion gap: 9 (ref 5–15)
BUN: 13 mg/dL (ref 6–20)
CHLORIDE: 97 mmol/L — AB (ref 101–111)
CO2: 30 mmol/L (ref 22–32)
CREATININE: 1.22 mg/dL — AB (ref 0.44–1.00)
Calcium: 8.6 mg/dL — ABNORMAL LOW (ref 8.9–10.3)
GFR calc non Af Amer: 39 mL/min — ABNORMAL LOW (ref >60)
GFR, EST AFRICAN AMERICAN: 45 mL/min — AB (ref >60)
GLUCOSE: 145 mg/dL — AB (ref 65–99)
Potassium: 4.4 mmol/L (ref 3.5–5.1)
Sodium: 136 mmol/L (ref 135–145)

## 2017-06-06 LAB — CBC
HEMATOCRIT: 25.8 % — AB (ref 36.0–46.0)
HEMOGLOBIN: 8 g/dL — AB (ref 12.0–15.0)
MCH: 27.6 pg (ref 26.0–34.0)
MCHC: 31 g/dL (ref 30.0–36.0)
MCV: 89 fL (ref 78.0–100.0)
Platelets: 250 10*3/uL (ref 150–400)
RBC: 2.9 MIL/uL — ABNORMAL LOW (ref 3.87–5.11)
RDW: 14.2 % (ref 11.5–15.5)
WBC: 9.8 10*3/uL (ref 4.0–10.5)

## 2017-06-06 LAB — GLUCOSE, CAPILLARY
GLUCOSE-CAPILLARY: 109 mg/dL — AB (ref 65–99)
GLUCOSE-CAPILLARY: 144 mg/dL — AB (ref 65–99)
Glucose-Capillary: 150 mg/dL — ABNORMAL HIGH (ref 65–99)
Glucose-Capillary: 155 mg/dL — ABNORMAL HIGH (ref 65–99)
Glucose-Capillary: 175 mg/dL — ABNORMAL HIGH (ref 65–99)

## 2017-06-06 MED ORDER — OXYCODONE HCL 5 MG PO TABS
5.0000 mg | ORAL_TABLET | ORAL | Status: DC | PRN
  Administered 2017-06-07 – 2017-06-08 (×3): 5 mg via ORAL
  Filled 2017-06-06 (×3): qty 1

## 2017-06-06 MED ORDER — POLYSACCHARIDE IRON COMPLEX 150 MG PO CAPS
150.0000 mg | ORAL_CAPSULE | Freq: Every day | ORAL | Status: DC
  Administered 2017-06-07 – 2017-06-10 (×4): 150 mg via ORAL
  Filled 2017-06-06 (×4): qty 1

## 2017-06-06 NOTE — Plan of Care (Signed)
Problem: Education: Goal: Knowledge of [facility] Education information/materials will improve Outcome: Progressing Pt educated on how to use call bell and to make sure to call us before getting. Call bell is in reach and bed is in low position.

## 2017-06-06 NOTE — Evaluation (Signed)
Physical Therapy Evaluation Patient Details Name: Melissa [name] MRN: [id number] DOB: 1928-08-22 Today's Date: 06/06/2017   History of Present Illness  83 y.o. female who underwent an ORIF of R proximal humerus fx s/p fall off of washing machine. Apparently with B ankle sprains. WBAT.   Clinical Impression  Patient presents with decreased independence with mobility due to deficits listed in PT problem list.  She currently needs min A for short distance ambulation and was independent prior to falling.  She will need skilled PT in the acute setting to address deficits and allow d/c to SNF level rehab prior to d/c home.     Follow Up Recommendations SNF;Supervision/Assistance - 24 hour    Equipment Recommendations  Other (comment) (TBA)    Recommendations for Other Services       Precautions / Restrictions Precautions Precautions: Shoulder Type of Shoulder Precautions: AROM elbow/wrist/hand/ pendulums/scapular retraction Shoulder Interventions: Shoulder sling/immobilizer;Off for dressing/bathing/exercises Restrictions Weight Bearing Restrictions: Yes RUE Weight Bearing: Non weight bearing Other Position/Activity Restrictions: WBAT on both LE's      Mobility  Bed Mobility Overal bed mobility: Needs Assistance Bed Mobility: Supine to Sit     Supine to sit: Min assist;HOB elevated     General bed mobility comments: used rail to pull up on L  Transfers Overall transfer level: Needs assistance Equipment used: 1 person hand held assist;Hemi-walker Transfers: Sit to/from Stand Sit to Stand: Min assist         General transfer comment: assist to stand with L HHA initially, then from toilet min A with hemiwalker  Ambulation/Gait Ambulation/Gait assistance: Min assist Ambulation Distance (Feet): 10 Feet (& 20') Assistive device: 1 person hand held assist;Hemi-walker Gait Pattern/deviations: Step-to pattern;Decreased stride length;Trunk flexed     General Gait Details: to  bathroom with HHA, then in room prior to return to supine with hemiwalker and min A cues for use   Stairs            Wheelchair Mobility    Modified Rankin (Stroke Patients Only)       Balance Overall balance assessment: Needs assistance   Sitting balance-Leahy Scale: Fair Sitting balance - Comments: leans back while on toilet due to lethargy     Standing balance-Leahy Scale: Poor Standing balance comment: assist for balance in standing                             Pertinent Vitals/Pain Pain Score: 6  Pain Location: R UE (and 2 in feet) Pain Descriptors / Indicators: Aching;Sore Pain Intervention(s): Monitored during session;Repositioned    Home Living Family/patient expects to be discharged to:: Skilled nursing facility Living Arrangements: Other relatives Available Help at Discharge: Family;Available PRN/intermittently Type of Home: House Home Access: Stairs to enter Entrance Stairs-Rails: Right;Left;Can reach both Entrance Stairs-Number of Steps: 4 Home Layout: Two level;Laundry or work area in [employer]: Walker;Shower seat;Bedside commode;Walker - 2 wheels;Cane - single point      Prior Function Level of Independence: Independent               Hand Dominance   Dominant Hand: Right    Extremity/Trunk Assessment   Upper Extremity Assessment Upper Extremity Assessment: Defer to OT evaluation    Lower Extremity Assessment Lower Extremity Assessment: Generalized weakness    Cervical / Trunk Assessment Cervical / Trunk Assessment: Kyphotic  Communication   Communication: No difficulties  Cognition Arousal/Alertness: Lethargic;Suspect due to medications Behavior  During Therapy: Flat affect Overall Cognitive Status: Within Functional Limits for tasks assessed                                        General Comments General comments (skin integrity, edema, etc.): grandaughter initially  stated pt would have assist at home, then son in and reported everyone works except his brother who is on disability and requesting SNF rehab.     Exercises     Assessment/Plan    PT Assessment Patient needs continued PT services  PT Problem List Decreased strength;Decreased mobility;Decreased safety awareness;Decreased knowledge of precautions;Decreased balance;Decreased activity tolerance;Decreased knowledge of use of DME;Pain       PT Treatment Interventions DME instruction;Therapeutic activities;Therapeutic exercise;Patient/family education;Gait training;Balance training;Functional mobility training    PT Goals (Current goals can be found in the Care Plan section)  Acute Rehab PT Goals Patient Stated Goal: to get better PT Goal Formulation: With patient Time For Goal Achievement: 06/13/17 Potential to Achieve Goals: Good    Frequency Min 3X/week   Barriers to discharge        Co-evaluation               AM-PAC PT "6 Clicks" Daily Activity  Outcome Measure Difficulty turning over in bed (including adjusting bedclothes, sheets and blankets)?: A Lot Difficulty moving from lying on back to sitting on the side of the bed? : Unable Difficulty sitting down on and standing up from a chair with arms (e.g., wheelchair, bedside commode, etc,.)?: Unable Help needed moving to and from a bed to chair (including a wheelchair)?: A Little Help needed walking in hospital room?: A Little Help needed climbing 3-5 steps with a railing? : A Lot 6 Click Score: 12    End of Session Equipment Utilized During Treatment: Gait belt;Other (comment) (R UE sling) Activity Tolerance: Patient limited by lethargy Patient left: in bed;with call bell/phone within reach;with family/visitor present   PT Visit Diagnosis: History of falling (Z91.81);Other abnormalities of gait and mobility (R26.89);Unsteadiness on feet (R26.81)    Time: 1194-1740 PT Time Calculation (min) (ACUTE ONLY): 27  min   Charges:   PT Evaluation $PT Eval Moderate Complexity: 1 Mod PT Treatments $Gait Training: 8-22 mins   PT G CodesMagda Kiel, [place] [phone] 06/06/2017   Reginia Naas 06/06/2017, 4:52 PM

## 2017-06-06 NOTE — Progress Notes (Signed)
OT Evaluation  PTA, pt lived with family and was modified independent with ADL and mobility @ [place] level. Pt with 2 recent falls and is at high risk for falls again. Pt very painful during evaluation and was unable to ambulate at this time due to lethargy ( due to pain meds). Given today's performance, history of falls, R UE ORIF, B ankle sprains and generalized weakness, feel pt will benefit from rehab at [facility]. Will follow acutely to address established goals and facilitate safe DC to next venue of care.    06/06/17 1200  OT Visit Information  Last OT Received On 06/06/17  Assistance Needed +2 (for safe mobility)  History of Present Illness 83 y.o. female who underwent an ORIF of R proximal humerus fx s/p fall off of washing machine. Apparently with B ankle sprains. WBAT.   Precautions  Precautions Shoulder  Type of Shoulder Precautions AROM elbow/wrist/hand/ pendulums/scapular retraction  Shoulder Interventions Shoulder sling/immobilizer;Off for dressing/bathing/exercises  Precaution Booklet Issued Yes (comment)  Restrictions  Weight Bearing Restrictions Yes  RUE Weight Bearing NWB  Home Living  Family/patient expects to be discharged to: Private residence  Living Arrangements Other relatives  Available Help at Discharge Family;Available PRN/intermittently  Type of Home House  Home Access Stairs to enter  Entrance Stairs-Number of Steps 4  Entrance Stairs-Rails Right;Left;Can reach both  Home Layout Two level  Alternate Level Stairs-Number of Steps 12--pt bedroom upstairs  Engineer, manufacturing systems Yes  How Accessible Accessible via walker  Home Equipment Grab bars - tub/shower;Shower seat;BSC;Walker - 2 wheels;Cane - single point  Additional Comments pt's son-in-law is usually at home with her but he is in the hospital --[facility]--currently  Prior Function  Level of Independence Independent  Comments dtr confirms pt was IND  prior to this illness  Communication  Communication No difficulties  Pain Assessment  Pain Assessment 0-10  Pain Score 5  Pain Location R UE  Pain Descriptors / Indicators Aching  Pain Intervention(s) Limited activity within patient's tolerance;Repositioned;Ice applied  Nurse, mental health;Suspect due to medications  Behavior During Therapy Anxious  Overall Cognitive Status No family/caregiver present to determine baseline cognitive functioning  General Comments most likely at baseline  Upper Extremity Assessment  Upper Extremity Assessment RUE deficits/detail  RUE Deficits / Details edematous RUE; verypainful. unable to fully extend R elbow or make full composite fist  RUE Coordination decreased fine motor;decreased gross motor  Lower Extremity Assessment  Lower Extremity Assessment Defer to PT evaluation (complains of BLE weakness)  ADL  Overall ADL's  Needs assistance/impaired  Eating/Feeding Set up  Grooming Minimal assistance;Sitting  Upper Body Bathing Moderate assistance;Sitting  Lower Body Bathing Moderate assistance;Sit to/from stand  Upper Body Dressing  Moderate assistance;Sitting  Lower Body Dressing Maximal assistance  Toilet Transfer Moderate assistance  Toilet Transfer Details (indicate cue type and reason) simulated. Significant posterior lean. unable to complete  Functional mobility during ADLs Moderate assistance  General ADL Comments limited by pain and lethargy  Vision- History  Baseline Vision/History Wears glasses  Bed Mobility  Overal bed mobility Needs Assistance  Bed Mobility Supine to Sit  Supine to sit Min assist  Transfers  Overall transfer level Needs assistance  Transfers Sit to/from Stand  Sit to Stand Mod assist  General transfer comment recommend +2 for mobility due to lethargy due to meds  Balance  Overall balance assessment History of Falls;Needs assistance  Sitting balance-Leahy Scale Fair  Standing balance-Leahy  Scale Poor  Standing balance comment posterior lean  Exercises  Exercises Other exercises  Other Exercises  Other Exercises attmepted elbow extensio - pt very painful adn unable ot complete  Other Exercises compostie flexion/extension x 10  OT - End of Session  Equipment Utilized During Treatment Gait belt  Activity Tolerance Patient limited by pain;Patient limited by lethargy  Patient left in bed;with call bell/phone within reach;with bed alarm set;with SCD's reapplied  Nurse Communication Patient requests pain meds;Precautions;Weight bearing status;Other (comment) (DC plan)  OT Assessment  OT Recommendation/Assessment Patient needs continued OT Services  OT Visit Diagnosis Unsteadiness on feet (R26.81);Muscle weakness (generalized) (M62.81);History of falling (Z91.81);Pain  Pain - Right/Left Right  Pain - part of body Shoulder  OT Problem List Decreased strength;Decreased range of motion;Decreased activity tolerance;Impaired balance (sitting and/or standing);Decreased coordination;Decreased safety awareness;Decreased knowledge of use of DME or AE;Decreased knowledge of precautions;Impaired UE functional use;Pain;Increased edema  OT Plan  OT Frequency (ACUTE ONLY) Min 3X/week  OT Treatment/Interventions (ACUTE ONLY) Self-care/ADL training;Therapeutic exercise;DME and/or AE instruction;Therapeutic activities;Patient/family education;Balance training  AM-PAC OT "6 Clicks" Daily Activity Outcome Measure  Help from another person eating meals? 4  Help from another person taking care of personal grooming? 3  Help from another person toileting, which includes using toliet, bedpan, or urinal? 2  Help from another person bathing (including washing, rinsing, drying)? 2  Help from another person to put on and taking off regular upper body clothing? 2  Help from another person to put on and taking off regular lower body clothing? 2  6 Click Score 15  ADL G Code Conversion CK  OT Recommendation   Follow Up Recommendations SNF;Supervision/Assistance - 24 hour  OT Equipment None recommended by OT  Individuals Consulted  Consulted and Agree with Results and Recommendations Patient  Acute Rehab OT Goals  Patient Stated Goal to get better  OT Goal Formulation With patient  Time For Goal Achievement 06/20/17  Potential to Achieve Goals Good  OT Time Calculation  OT Start Time (ACUTE ONLY) 1043  OT Stop Time (ACUTE ONLY) 1109  OT Time Calculation (min) 26 min  OT General Charges  $OT Visit 1 Procedure  OT Evaluation  $OT Eval Moderate Complexity 1 Procedure  OT Treatments  $Self Care/Home Management  8-22 mins  Written Expression  Dominant Hand Right  [facility], OT/L  [phone] 06/06/2017

## 2017-06-06 NOTE — Progress Notes (Signed)
[facility] PROGRESS NOTE  Melissa [name] LKG:[id number] DOB: Feb 03, 1928 DOA: 06/05/2017 PCP: [name], MD  Interim summary and HPI  83 y.o. female with medical history significant for chronic systolic heart failure secondary to NICM with ejection fraction of 15-25%, chronic anemia, chronic kidney disease stage III, dyslipidemia, hypertension, atrial fibrillation on chronic anticoagulation, diabetes and hypothyroidism. Admitted after mechanical fall with right humerus fracture and required ORIF. [facility] consulted to assist with management of chronic medical problems.  Assessment/Plan: 1-right humerus fracture: s/p ORIF -further post operative treatment per primary team  2-CKD stage 3 -stable renal function -minimize nephrotoxic agents and follow trend intermittently -encourage to maintain good hydration   3-HTN -stable -continue toprol  4-chronic systolic HF -last EF 66-44% -continue b-blocker -condition is compensated  -follow daily weight and strict I's and O's  5-type 2 diabetes mellitus -continue SSI and lantus -CBG's 140's-170's  6-atrial fibrillation -CHADsVASC score 6 -continue rate controlled -anticoagulation resumed  7-HLD -continue statins  8-hypothyroidism -continue levothyroxine  9-acute on chronic anemia: from ABL during surgery -patient AOCD stage 3 at baseline from renal failure -Hgb down to 8 range today -will start niferex (better absorb) -follow trend   Code Status: Full Family Communication: no family at bedside  Disposition Plan: will imagine need for SNF at discharge. Final dispo per primary team. Chronic medical problems stable.  Procedures:  Right humerus ORIF  Antibiotics:  Ceftin   HPI/Subjective: Afebrile, denying SOB and CP. Reports moderate pain in her right shoulder.  Objective: Vitals:   06/06/17 1500 06/06/17 2008  BP: (!) 106/41 (!) 100/38  Pulse: 84 74  Resp: 16 18  Temp: 98.8 F (37.1 C) (!) 97.4 F (36.3  C)  SpO2: 93% 100%    Intake/Output Summary (Last 24 hours) at 06/06/17 2155 Last data filed at 06/06/17 1700  Gross per 24 hour  Intake              510 ml  Output                2 ml  Net              508 ml   Filed Weights   06/06/17 0500  Weight: 73.3 kg (161 lb 8 oz)    Exam:   General:  Afebrile, no CP and no SOB. Reporting moderate pain in her right shoulder  Cardiovascular: S1 and S2, positive soft SEM, no rubs or gallops  Respiratory: CTA bilaterally  Abdomen: soft, NT, ND, positive BS  Musculoskeletal: no edema, no cyanosis; RUE on sling with clean dressings.  Data Reviewed: Basic Metabolic Panel:  Recent Labs Lab 06/03/17 1406 06/06/17 0443  NA 137 136  K 4.2 4.4  CL 99* 97*  CO2 30 30  GLUCOSE 178* 145*  BUN 14 13  CREATININE 1.31* 1.22*  CALCIUM 8.9 8.6*   CBC:  Recent Labs Lab 06/03/17 1406 06/06/17 0443  WBC 8.6 9.8  HGB 10.1* 8.0*  HCT 33.3* 25.8*  MCV 89.5 89.0  PLT 282 250   BNP (last 3 results)  Recent Labs  10/19/16 1906  BNP 176.9*    CBG:  Recent Labs Lab 06/06/17 0613 06/06/17 1300 06/06/17 1524 06/06/17 1722 06/06/17 2134  GLUCAP 150* 155* 175* 109* 144*    No results found for this or any previous visit (from the past 240 hour(s)).   Studies: Dg Humerus Right  Result Date: 06/05/2017 CLINICAL DATA:  Open reduction internal fixation for proximal humerus fracture. EXAM: RIGHT  SHOULDER - 2+ VIEW; DG C-ARM 61-120 MIN COMPARISON:  May 26, 2017. FLUOROSCOPY TIME:  0 minutes 15 seconds; 2 acquired images FINDINGS: Frontal and lateral views were obtained. There is screw and plate fixation through a comminuted fracture of the proximal humeral diaphysis. Alignment is near anatomic at fracture site. No dislocation. No appreciable arthropathy. IMPRESSION: Alignment near anatomic at site of open reduction internal fixation for proximal humerus fracture. No new fracture or dislocation. Electronically Signed   By: Lowella Grip III M.D.   On: 06/05/2017 12:52   Dg C-arm 1-60 Min  Result Date: 06/05/2017 CLINICAL DATA:  Open reduction internal fixation for proximal humerus fracture. EXAM: RIGHT SHOULDER - 2+ VIEW; DG C-ARM 61-120 MIN COMPARISON:  May 26, 2017. FLUOROSCOPY TIME:  0 minutes 15 seconds; 2 acquired images FINDINGS: Frontal and lateral views were obtained. There is screw and plate fixation through a comminuted fracture of the proximal humeral diaphysis. Alignment is near anatomic at fracture site. No dislocation. No appreciable arthropathy. IMPRESSION: Alignment near anatomic at site of open reduction internal fixation for proximal humerus fracture. No new fracture or dislocation. Electronically Signed   By: Lowella Grip III M.D.   On: 06/05/2017 12:52    Scheduled Meds: . apixaban  2.5 mg Oral BID  . atorvastatin  40 mg Oral Daily  . cefUROXime  500 mg Oral BID  . cholecalciferol  1,000 Units Oral Daily  . ferrous sulfate  325 mg Oral BID WC  . FLUoxetine  20 mg Oral Daily  . insulin aspart  0-15 Units Subcutaneous TID WC  . insulin aspart  0-5 Units Subcutaneous QHS  . insulin detemir  12 Units Subcutaneous Daily  . levothyroxine  50 mcg Oral Daily  . metoprolol succinate  25 mg Oral Daily  . omega-3 acid ethyl esters  1 g Oral Daily  . pantoprazole  40 mg Oral Daily  . pramipexole  0.5 mg Oral QHS  . pregabalin  50 mg Oral TID  . senna  1 tablet Oral BID  . traZODone  100 mg Oral QHS   Continuous Infusions: . lactated ringers 10 mL/hr at 06/05/17 0933  . methocarbamol (ROBAXIN)  IV      Principal Problem:   Closed fracture of right proximal humerus Active Problems:   CKD (chronic kidney disease), stage III   High cholesterol   Hypertension   NICM (nonischemic cardiomyopathy) ([place])   Type II diabetes mellitus ([place])   Hypothyroidism   Anemia   Diabetes mellitus with complication ([place])    Time spent: 25 minutes    Barton Dubois  [facility] Pager  [phone]. If 7PM-7AM, please contact night-coverage at [email or web address], password [email or web address] 06/06/2017, 9:55 PM  LOS: 1 day

## 2017-06-06 NOTE — Progress Notes (Signed)
   Subjective:  Patient reports pain as moderate.  Pain controlled with meds. Denies cp, SOB, n/v, or lightheadedness  Objective:   VITALS:   Vitals:   06/05/17 1425 06/05/17 1500 06/05/17 2010 06/06/17 0500  BP: (!) 100/48 (!) 114/47 (!) 121/43 (!) 125/46  Pulse: 82 85 89 95  Resp: 17 18 17 16   Temp:  98 F (36.7 C) 98.9 F (37.2 C) 98.6 F (37 C)  TempSrc:  Oral Oral Oral  SpO2: 96% 95% 94% 99%  Weight:    73.3 kg (161 lb 8 oz)     RUE- Sling in place, incision/bandage c/d/i, no drainage.   +SILT ax/mc/labc/med/uln/rad +motor ax/mc/ain/pin/rad/med/uln 2+ rad pulse  Lab Results  Component Value Date   WBC 9.8 06/06/2017   HGB 8.0 (L) 06/06/2017   HCT 25.8 (L) 06/06/2017   MCV 89.0 06/06/2017   PLT 250 06/06/2017   BMET    Component Value Date/Time   NA 136 06/06/2017 0443   K 4.4 06/06/2017 0443   CL 97 (L) 06/06/2017 0443   CO2 30 06/06/2017 0443   GLUCOSE 145 (H) 06/06/2017 0443   BUN 13 06/06/2017 0443   CREATININE 1.22 (H) 06/06/2017 0443   CREATININE 1.28 (H) 09/26/2015 1746   CALCIUM 8.6 (L) 06/06/2017 0443   GFRNONAA 39 (L) 06/06/2017 0443   GFRAA 45 (L) 06/06/2017 0443     Assessment/Plan: 1 Day Post-Op   Principal Problem:   Closed fracture of right proximal humerus Active Problems:   CKD (chronic kidney disease), stage III   High cholesterol   Hypertension   NICM (nonischemic cardiomyopathy) ([place])   Type II diabetes mellitus ([place])   Hypothyroidism   Anemia   Diabetes mellitus with complication ([place]) - NWB RUE with sling, ok for pendulums and scap retractions and full A/PROM at elbow/hand/wrist  -home eluiqis for Afib and DVT ppx, along with SCDs  - appreciate medicine team help with chronic medical problem management  - SSI for DM  - monitor fluids and intake for CHF, home meds restarted  - home synthroid for hypothyroidism  - will follow up on therapy recs for dispo  - acute blood loss anemia, stable now, will follow along  with daily cbc  Nicholes Stairs 06/06/2017, 7:34 AM   Geralynn Rile, MD [phone]

## 2017-06-06 NOTE — Anesthesia Postprocedure Evaluation (Signed)
Anesthesia Post Note  Patient: Melissa [name]  Procedure(s) Performed: Procedure(s) (LRB): OPEN REDUCTION INTERNAL FIXATION (ORIF) PROXIMAL HUMERUS FRACTURE (Right)     Patient location during evaluation: PACU Anesthesia Type: General Level of consciousness: awake and alert and patient cooperative Pain management: pain level controlled Vital Signs Assessment: post-procedure vital signs reviewed and stable Respiratory status: spontaneous breathing and respiratory function stable Cardiovascular status: stable Anesthetic complications: no    Last Vitals:  Vitals:   06/05/17 2010 06/06/17 0500  BP: (!) 121/43 (!) 125/46  Pulse: 89 95  Resp: 17 16  Temp: 37.2 C 37 C  SpO2: 94% 99%    Last Pain:  Vitals:   06/06/17 0500  TempSrc: Oral  PainSc:                  [place] S

## 2017-06-07 DIAGNOSIS — D62 Acute posthemorrhagic anemia: Secondary | ICD-10-CM

## 2017-06-07 DIAGNOSIS — I428 Other cardiomyopathies: Secondary | ICD-10-CM

## 2017-06-07 DIAGNOSIS — D631 Anemia in chronic kidney disease: Secondary | ICD-10-CM

## 2017-06-07 LAB — CBC
HCT: 27.9 % — ABNORMAL LOW (ref 36.0–46.0)
Hemoglobin: 8.5 g/dL — ABNORMAL LOW (ref 12.0–15.0)
MCH: 28.2 pg (ref 26.0–34.0)
MCHC: 30.5 g/dL (ref 30.0–36.0)
MCV: 92.7 fL (ref 78.0–100.0)
PLATELETS: 252 10*3/uL (ref 150–400)
RBC: 3.01 MIL/uL — ABNORMAL LOW (ref 3.87–5.11)
RDW: 14.6 % (ref 11.5–15.5)
WBC: 8.7 10*3/uL (ref 4.0–10.5)

## 2017-06-07 LAB — BASIC METABOLIC PANEL
ANION GAP: 8 (ref 5–15)
BUN: 17 mg/dL (ref 6–20)
CALCIUM: 8.6 mg/dL — AB (ref 8.9–10.3)
CO2: 31 mmol/L (ref 22–32)
CREATININE: 1.37 mg/dL — AB (ref 0.44–1.00)
Chloride: 95 mmol/L — ABNORMAL LOW (ref 101–111)
GFR calc Af Amer: 39 mL/min — ABNORMAL LOW (ref >60)
GFR, EST NON AFRICAN AMERICAN: 34 mL/min — AB (ref >60)
GLUCOSE: 120 mg/dL — AB (ref 65–99)
Potassium: 4.8 mmol/L (ref 3.5–5.1)
Sodium: 134 mmol/L — ABNORMAL LOW (ref 135–145)

## 2017-06-07 LAB — GLUCOSE, CAPILLARY
GLUCOSE-CAPILLARY: 138 mg/dL — AB (ref 65–99)
Glucose-Capillary: 120 mg/dL — ABNORMAL HIGH (ref 65–99)
Glucose-Capillary: 138 mg/dL — ABNORMAL HIGH (ref 65–99)
Glucose-Capillary: 201 mg/dL — ABNORMAL HIGH (ref 65–99)

## 2017-06-07 MED ORDER — METHOCARBAMOL 500 MG PO TABS: 500.0000 mg | ORAL_TABLET | Freq: Four times a day (QID) | ORAL | 0 refills | Status: DC | PRN

## 2017-06-07 MED ORDER — APIXABAN 2.5 MG PO TABS
2.5000 mg | ORAL_TABLET | ORAL | Status: AC
  Administered 2017-06-07: 2.5 mg via ORAL
  Filled 2017-06-07: qty 1

## 2017-06-07 MED ORDER — HYDROCODONE-ACETAMINOPHEN 5-325 MG PO TABS
1.0000 | ORAL_TABLET | ORAL | Status: DC | PRN
  Administered 2017-06-07 – 2017-06-08 (×5): 2 via ORAL
  Administered 2017-06-09 – 2017-06-10 (×4): 1 via ORAL
  Filled 2017-06-07 (×2): qty 1
  Filled 2017-06-07: qty 2
  Filled 2017-06-07: qty 1
  Filled 2017-06-07: qty 2
  Filled 2017-06-07: qty 1
  Filled 2017-06-07 (×4): qty 2

## 2017-06-07 MED ORDER — APIXABAN 5 MG PO TABS: 5.0000 mg | ORAL_TABLET | Freq: Two times a day (BID) | ORAL | Status: DC

## 2017-06-07 MED ORDER — POLYETHYLENE GLYCOL 3350 17 G PO PACK
17.0000 g | PACK | Freq: Two times a day (BID) | ORAL | Status: DC
  Administered 2017-06-07 – 2017-06-09 (×3): 17 g via ORAL
  Filled 2017-06-07 (×6): qty 1

## 2017-06-07 MED ORDER — APIXABAN 5 MG PO TABS
5.0000 mg | ORAL_TABLET | Freq: Two times a day (BID) | ORAL | Status: DC
  Administered 2017-06-07 – 2017-06-10 (×6): 5 mg via ORAL
  Filled 2017-06-07 (×6): qty 1

## 2017-06-07 MED ORDER — HYDROCODONE-ACETAMINOPHEN 5-325 MG PO TABS: 1.0000 | ORAL_TABLET | Freq: Four times a day (QID) | ORAL | 0 refills | Status: DC | PRN

## 2017-06-07 NOTE — Progress Notes (Signed)
   Subjective: 2 Days Post-Op Procedure(s) (LRB): OPEN REDUCTION INTERNAL FIXATION (ORIF) PROXIMAL HUMERUS FRACTURE (Right)  C/o moderate pain in the right shoulder this morning Oxycodone does not seem to be working well Plan for SNF placement  Patient reports pain as moderate.  Objective:   VITALS:   Vitals:   06/06/17 2008 06/07/17 0456  BP: (!) 100/38 (!) 131/55  Pulse: 74 78  Resp: 18 18  Temp: (!) 97.4 F (36.3 C) 97.6 F (36.4 C)  SpO2: 100% 99%    Right shoulder dressing intact Moderate edema and ecchymosis nv intact distally Sling in place  LABS  Recent Labs  06/06/17 0443 06/07/17 0515  HGB 8.0* 8.5*  HCT 25.8* 27.9*  WBC 9.8 8.7  PLT 250 252     Recent Labs  06/06/17 0443 06/07/17 0515  NA 136 134*  K 4.4 4.8  BUN 13 17  CREATININE 1.22* 1.37*  GLUCOSE 145* 120*     Assessment/Plan: 2 Days Post-Op Procedure(s) (LRB): OPEN REDUCTION INTERNAL FIXATION (ORIF) PROXIMAL HUMERUS FRACTURE (Right) Will try norco for pain instead of oxycodone to achieve better pain management Will need SNF placement and make sure social work is aware Gentle PT/OT Will monitor her progress    Merla Riches, MPAS, PA-C  06/07/2017, 9:06 AM

## 2017-06-07 NOTE — Discharge Instructions (Signed)
-  Nonweightbearing to the right upper extremity.  Use the sling for comfort. - He may remove the sling to perform active and passive range of motion exercises at the elbow hand and wrist. - He should resume your Eliquis as per before surgery which will serve to help prevent blood clots. - Your postoperative bandage may be left in place until your follow-up appointment with your surgeon. - He may shower with her postoperative bandage in place. - Follow-up in 2 weeks for wound check with Dr. Stann Mainland.     Information on my medicine - ELIQUIS (apixaban)  This medication education was reviewed with me or my healthcare representative as part of my discharge preparation.  The pharmacist that spoke with me during my hospital stay was:  Jaquita Folds, [facility]  Why was Eliquis prescribed for you? Eliquis was prescribed for you to reduce the risk of a blood clot forming that can cause a stroke if you have a medical condition called atrial fibrillation (a type of irregular heartbeat).  What do You need to know about Eliquis ? Take your Eliquis TWICE DAILY - one tablet in the morning and one tablet in the evening with or without food. If you have difficulty swallowing the tablet whole please discuss with your pharmacist how to take the medication safely.  Take Eliquis exactly as prescribed by your doctor and DO NOT stop taking Eliquis without talking to the doctor who prescribed the medication.  Stopping may increase your risk of developing a stroke.  Refill your prescription before you run out.  After discharge, you should have regular check-up appointments with your healthcare provider that is prescribing your Eliquis.  In the future your dose may need to be changed if your kidney function or weight changes by a significant amount or as you get older.  What do you do if you miss a dose? If you miss a dose, take it as soon as you remember on the same day and resume taking twice daily.  Do not take  more than one dose of ELIQUIS at the same time to make up a missed dose.  Important Safety Information A possible side effect of Eliquis is bleeding. You should call your healthcare provider right away if you experience any of the following: ? Bleeding from an injury or your nose that does not stop. ? Unusual colored urine (red or dark brown) or unusual colored stools (red or black). ? Unusual bruising for unknown reasons. ? A serious fall or if you hit your head (even if there is no bleeding).  Some medicines may interact with Eliquis and might increase your risk of bleeding or clotting while on Eliquis. To help avoid this, consult your healthcare provider or pharmacist prior to using any new prescription or non-prescription medications, including herbals, vitamins, non-steroidal anti-inflammatory drugs (NSAIDs) and supplements.  This website has more information on Eliquis (apixaban): http://www.eliquis.com/eliquis/home

## 2017-06-07 NOTE — Progress Notes (Signed)
[facility] PROGRESS NOTE  Melissa [name] SEG:[id number] DOB: 07-24-1928 DOA: 06/05/2017 PCP: [name], MD  Interim summary and HPI  83 y.o. female with medical history significant for chronic systolic heart failure secondary to NICM with ejection fraction of 15-25%, chronic anemia, chronic kidney disease stage III, dyslipidemia, hypertension, atrial fibrillation on chronic anticoagulation, diabetes and hypothyroidism. Admitted after mechanical fall with right humerus fracture and required ORIF. [facility] consulted to assist with management of chronic medical problems.  Assessment/Plan: 1-right humerus fracture: s/p ORIF -further post operative treatment per primary team -keep narcotics as less needed as possible. Patient with episodes of somnolence and lethargy.  2-CKD stage 3 -stable renal function -minimize nephrotoxic agents and follow trend intermittently -encourage to maintain good hydration   3-HTN -stable -continue toprol  4-chronic systolic HF -last EF 73-71% -continue b-blocker -condition remained compensated  -follow daily weight and strict I's and O's  5-type 2 diabetes mellitus -continue SSI and lantus -CBG's 140's-170's  6-atrial fibrillation -CHADsVASC score 6 -will continue eliquis  -rate controlled   7-HLD -continue statins   8-hypothyroidism -continue synthroid   9-acute on chronic anemia: from ABL during surgery -patient AOCD stage 3 at baseline from renal failure -Hgb climbing 8.5 today and no signs of acute bleeding  -continue niferex  10-constipation -will add miralax BID -continue Senokot -patient with constipation most likely from pain meds and underlying hx of hypothyroidism   Code Status: Full Family Communication: no family at bedside  Disposition Plan: will imagine need for SNF at discharge. Final dispo per primary team. Chronic medical problems stable.  Procedures:  Right humerus ORIF  Antibiotics:  Ceftin    HPI/Subjective: Afebrile, no CP, no SOB, no nausea and no vomiting. Reports constipation and pain in her right shoulder as moderate to severe.  Objective: Vitals:   06/07/17 0456 06/07/17 1500  BP: (!) 131/55 (!) 104/44  Pulse: 78 86  Resp: 18 18  Temp: 97.6 F (36.4 C) 97.7 F (36.5 C)  SpO2: 99% 98%    Intake/Output Summary (Last 24 hours) at 06/07/17 1744 Last data filed at 06/07/17 1600  Gross per 24 hour  Intake            335.5 ml  Output                0 ml  Net            335.5 ml   Filed Weights   06/06/17 0500  Weight: 73.3 kg (161 lb 8 oz)    Exam:   General:  Afebrile, no CP, no SOB. Continue to have pain in her shoulder. Reports constipation.  Cardiovascular: S1 and S2, no rubs, no gallops, positive SEM  Respiratory: good air movement bilaterally, no wheezing, no crackles  Abdomen: soft, NT, ND, positive BS  Musculoskeletal: no edema, no cyanosis, no clubbing; RUE on sling, with moderate to severe pain on movement.  Skin: right upper chest with bruise and ecchymosis, most likely from fall.   Data Reviewed: Basic Metabolic Panel:  Recent Labs Lab 06/03/17 1406 06/06/17 0443 06/07/17 0515  NA 137 136 134*  K 4.2 4.4 4.8  CL 99* 97* 95*  CO2 30 30 31   GLUCOSE 178* 145* 120*  BUN 14 13 17   CREATININE 1.31* 1.22* 1.37*  CALCIUM 8.9 8.6* 8.6*   CBC:  Recent Labs Lab 06/03/17 1406 06/06/17 0443 06/07/17 0515  WBC 8.6 9.8 8.7  HGB 10.1* 8.0* 8.5*  HCT 33.3* 25.8* 27.9*  MCV 89.5  89.0 92.7  PLT 282 250 252   BNP (last 3 results)  Recent Labs  10/19/16 1906  BNP 176.9*    CBG:  Recent Labs Lab 06/06/17 1722 06/06/17 2134 06/07/17 0639 06/07/17 1204 06/07/17 1619  GLUCAP 109* 144* 120* 138* 138*    Studies: No results found.  Scheduled Meds: . apixaban  5 mg Oral BID  . atorvastatin  40 mg Oral Daily  . cefUROXime  500 mg Oral BID  . cholecalciferol  1,000 Units Oral Daily  . FLUoxetine  20 mg Oral Daily  .  insulin aspart  0-15 Units Subcutaneous TID WC  . insulin aspart  0-5 Units Subcutaneous QHS  . insulin detemir  12 Units Subcutaneous Daily  . iron polysaccharides  150 mg Oral Daily  . levothyroxine  50 mcg Oral Daily  . metoprolol succinate  25 mg Oral Daily  . omega-3 acid ethyl esters  1 g Oral Daily  . pantoprazole  40 mg Oral Daily  . pramipexole  0.5 mg Oral QHS  . pregabalin  50 mg Oral TID  . senna  1 tablet Oral BID  . traZODone  100 mg Oral QHS   Continuous Infusions: . lactated ringers 10 mL/hr at 06/05/17 0933  . methocarbamol (ROBAXIN)  IV      Principal Problem:   Closed fracture of right proximal humerus Active Problems:   CKD (chronic kidney disease), stage III   High cholesterol   Hypertension   NICM (nonischemic cardiomyopathy) ([place])   Type II diabetes mellitus ([place])   Hypothyroidism   Anemia   Diabetes mellitus with complication ([place])    Time spent: 25 minutes    Barton Dubois  [facility] Pager [phone]. If 7PM-7AM, please contact night-coverage at [email or web address], password [facility] 06/07/2017, 5:44 PM  LOS: 2 days

## 2017-06-07 NOTE — Progress Notes (Signed)
Occupational Therapy Treatment Patient Details Name: Melissa [name] MRN: [id number] DOB: 11-Jun-1928 Today's Date: 06/07/2017    History of present illness 83 y.o. female who underwent an ORIF of R proximal humerus fx s/p fall off of washing machine. Apparently with B ankle sprains. WBAT.    OT comments  Education, demonstration, and return demonstration with pt. And dtr. On ROM to RUE with focus on digits, wrist, and elbow.  Pt. Very tired and dozing off during session.  Pts. Dtr. Able to verbalize and demonstrate proper technique.  Will continue to follow acutely.   Follow Up Recommendations  SNF;Supervision/Assistance - 24 hour    Equipment Recommendations  None recommended by OT    Recommendations for Other Services      Precautions / Restrictions Precautions Precautions: Shoulder Type of Shoulder Precautions: AROM elbow/wrist/hand/ pendulums/scapular retraction Shoulder Interventions: Shoulder sling/immobilizer;Off for dressing/bathing/exercises Restrictions Weight Bearing Restrictions: Yes RUE Weight Bearing: Non weight bearing       Mobility Bed Mobility                  Transfers                      Balance                                           ADL either performed or assessed with clinical judgement   ADL                                               Vision       Perception     Praxis      Cognition                                                Exercises General Exercises - Upper Extremity Elbow Flexion: AROM;Right;10 reps;Seated Elbow Extension: AROM;Right;10 reps;Seated Wrist Flexion: AROM;Right;10 reps;Seated Wrist Extension: AROM;Right;10 reps;Seated Digit Composite Flexion: AROM;Right;10 reps;Seated Composite Extension: AROM;Right;10 reps;Seated   Shoulder Instructions       General Comments      Pertinent Vitals/ Pain          Home Living                                           Prior Functioning/Environment              Frequency  Min 3X/week        Progress Toward Goals  OT Goals(current goals can now be found in the care plan section)  Progress towards OT goals: Progressing toward goals     Plan      Co-evaluation                 AM-PAC PT "6 Clicks" Daily Activity     Outcome Measure   Help from another person eating meals?: None Help from another person taking care of personal grooming?: A Little Help from another person toileting, which includes using toliet, bedpan, or urinal?:  A Lot Help from another person bathing (including washing, rinsing, drying)?: A Lot Help from another person to put on and taking off regular upper body clothing?: A Lot Help from another person to put on and taking off regular lower body clothing?: A Lot 6 Click Score: 15    End of Session    OT Visit Diagnosis: Unsteadiness on feet (R26.81);Muscle weakness (generalized) (M62.81);History of falling (Z91.81);Pain Pain - Right/Left: Right Pain - part of body: Shoulder   Activity Tolerance Patient limited by lethargy   Patient Left in chair;with call bell/phone within reach;with family/visitor present   Nurse Communication          Time: 1040-1050 OT Time Calculation (min): 10 min  Charges: OT General Charges $OT Visit: 1 Procedure OT Treatments $Therapeutic Exercise: 8-22 mins   Janice Coffin, COTA/L 06/07/2017, 11:19 AM

## 2017-06-07 NOTE — NC FL2 (Signed)
[place] LEVEL OF CARE SCREENING TOOL     IDENTIFICATION  Patient Name: STEHANIE EKSTROM Birthdate: 1928-04-08 Sex: female Admission Date (Current Location): 06/05/2017  [facility] and [place] Number:  Herbalist and Address:  [facility], [place] [address] [place], [place] [address]      Provider Number: [id number]  Attending Physician Name and Address:  Nicholes Stairs, MD  Relative Name and Phone Number:       Current Level of Care: Hospital Recommended Level of Care: [place] Prior Approval Number:    Date Approved/Denied:   PASRR Number:    Discharge Plan: SNF    Current Diagnoses: Patient Active Problem List   Diagnosis Date Noted  . Closed fracture of right proximal humerus 06/05/2017  . CKD (chronic kidney disease), stage III 06/05/2017  . High cholesterol 06/05/2017  . Hypertension 06/05/2017  . NICM (nonischemic cardiomyopathy) ([place]) 06/05/2017  . Type II diabetes mellitus ([place]) 06/05/2017  . Anemia 06/05/2017  . Diabetes mellitus with complication ([place])   . Acute on chronic systolic heart failure ([place]) 10/23/2016  . Viral bronchitis   . Dyspnea 10/20/2016  . Influenza-like illness 10/19/2016  . Chronic systolic CHF (congestive heart failure) ([place]) 10/19/2016  . Symptomatic cholelithiasis 01/24/2016  . 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 Chest pain with moderate risk of acute coronary syndrome 11/14/2015  . Left upper extremity numbness   . Paresthesia 08/22/2015  . Tremor, essential 08/22/2015  . Paroxysmal atrial fibrillation ([place]) 03/06/2015  . Chronic anticoagulation 03/06/2015  . Orthostatic hypotension 03/06/2015  . Essential hypertension 04/19/2014  . Chronic kidney disease, stage III (moderate) 04/19/2014  . Hypothyroidism, acquired 06/15/2013  . Hypothyroidism 06/15/2013  . Normocytic anemia 06/10/2013  . Left ventricular dysfunction 09/26/2011   . Asthma exacerbation 09/23/2011  . Type 2 diabetes mellitus with diabetic neuropathy, with long-term current use of insulin ([place]) 09/22/2011  . HLD (hyperlipidemia) 09/22/2011    Orientation RESPIRATION BLADDER Height & Weight     Self, Time, Situation, Place  O2 (2L) Incontinent Weight: 161 lb 8 oz (73.3 kg) Height:     BEHAVIORAL SYMPTOMS/MOOD NEUROLOGICAL BOWEL NUTRITION STATUS      Continent Diet (Carb Modified / Thin Liquid)  AMBULATORY STATUS COMMUNICATION OF NEEDS Skin   Limited Assist Verbally Surgical wounds (Right Shoulder Incision)                       Personal Care Assistance Level of Assistance  Bathing, Feeding, Dressing Bathing Assistance: Limited assistance Feeding assistance: Independent Dressing Assistance: Limited assistance     Functional Limitations Info  Sight, Hearing, Speech Sight Info: Adequate Hearing Info: Adequate Speech Info: Adequate    SPECIAL CARE FACTORS FREQUENCY  PT (By licensed PT), OT (By licensed OT)     PT Frequency: 3x week OT Frequency: 3x week            Contractures Contractures Info: Not present    Additional Factors Info  Code Status, Allergies, Insulin Sliding Scale, Psychotropic Code Status Info: Full Code Allergies Info: No Known Allergies Psychotropic Info: Prozac Insulin Sliding Scale Info: Novolog 3 times daily with meals and bedtime       Current Medications (06/07/2017):  This is the current hospital active medication list Current Facility-Administered Medications  Medication Dose Route Frequency Provider Last Rate Last Dose  . acetaminophen (TYLENOL) tablet 650 mg  650 mg Oral Q6H PRN  Nicholes Stairs, MD   650 mg at 06/06/17 1728   Or  . acetaminophen (TYLENOL) suppository 650 mg  650 mg Rectal Q6H PRN Nicholes Stairs, MD      . albuterol (PROVENTIL) (2.5 MG/3ML) 0.083% nebulizer solution 2.5 mg  2.5 mg Nebulization Q6H PRN Nicholes Stairs, MD      . apixaban Arne Cleveland) tablet 5 mg   5 mg Oral BID Jaquita Folds, RPH      . atorvastatin (LIPITOR) tablet 40 mg  40 mg Oral Daily Nicholes Stairs, MD   40 mg at 06/07/17 0933  . benzonatate (TESSALON) capsule 200 mg  200 mg Oral TID PRN Nicholes Stairs, MD      . cefUROXime (CEFTIN) tablet 500 mg  500 mg Oral BID Nicholes Stairs, MD   500 mg at 06/07/17 0930  . cholecalciferol (VITAMIN D) tablet 1,000 Units  1,000 Units Oral Daily Nicholes Stairs, MD   1,000 Units at 06/07/17 [phone]  . FLUoxetine (PROZAC) capsule 20 mg  20 mg Oral Daily Nicholes Stairs, MD   20 mg at 06/07/17 0930  . HYDROcodone-acetaminophen (NORCO/VICODIN) 5-325 MG per tablet 1-2 tablet  1-2 tablet Oral Q4H PRN Cline Crock, PA-C   2 tablet at 06/07/17 0931  . insulin aspart (novoLOG) injection 0-15 Units  0-15 Units Subcutaneous TID WC Nicholes Stairs, MD   3 Units at 06/06/17 1200  . insulin aspart (novoLOG) injection 0-5 Units  0-5 Units Subcutaneous QHS Nicholes Stairs, MD      . insulin detemir (LEVEMIR) injection 12 Units  12 Units Subcutaneous Daily Samella Parr, NP   12 Units at 06/07/17 0934  . iron polysaccharides (NIFEREX) capsule 150 mg  150 mg Oral Daily Barton Dubois, MD   150 mg at 06/07/17 0932  . lactated ringers infusion   Intravenous Continuous Albertha Ghee, MD 10 mL/hr at 06/05/17 0933    . levothyroxine (SYNTHROID, LEVOTHROID) tablet 50 mcg  50 mcg Oral Daily Nicholes Stairs, MD   50 mcg at 06/07/17 [phone]  . methocarbamol (ROBAXIN) tablet 500 mg  500 mg Oral Q6H PRN Nicholes Stairs, MD   500 mg at 06/07/17 0459   Or  . methocarbamol (ROBAXIN) 500 mg in dextrose 5 % 50 mL IVPB  500 mg Intravenous Q6H PRN Nicholes Stairs, MD      . metoprolol succinate (TOPROL-XL) 24 hr tablet 25 mg  25 mg Oral Daily Nicholes Stairs, MD   25 mg at 06/06/17 0820  . morphine 4 MG/ML injection 2 mg  2 mg Intravenous Q2H PRN Nicholes Stairs, MD   2 mg at 06/06/17 1046  . omega-3  acid ethyl esters (LOVAZA) capsule 1 g  1 g Oral Daily Nicholes Stairs, MD   1 g at 06/07/17 0931  . oxyCODONE (Oxy IR/ROXICODONE) immediate release tablet 5 mg  5 mg Oral Q3H PRN Barton Dubois, MD   5 mg at 06/07/17 0500  . pantoprazole (PROTONIX) EC tablet 40 mg  40 mg Oral Daily Nicholes Stairs, MD   40 mg at 06/07/17 0932  . pramipexole (MIRAPEX) tablet 0.5 mg  0.5 mg Oral QHS Nicholes Stairs, MD   0.5 mg at 06/06/17 2143  . pregabalin (LYRICA) capsule 50 mg  50 mg Oral TID Nicholes Stairs, MD   50 mg at 06/07/17 0933  . senna (SENOKOT) tablet 8.6 mg  1 tablet Oral BID Victorino December  Saralyn Pilar, MD   8.6 mg at 06/07/17 0932  . traZODone (DESYREL) tablet 100 mg  100 mg Oral QHS Nicholes Stairs, MD   100 mg at 06/06/17 2143     Discharge Medications: Please see discharge summary for a list of discharge medications.  Relevant Imaging Results:  Relevant Lab Results:   Additional Information SSN [id number]    Barbette Or, [place]

## 2017-06-08 DIAGNOSIS — I482 Chronic atrial fibrillation: Secondary | ICD-10-CM

## 2017-06-08 LAB — CBC
HCT: 26.5 % — ABNORMAL LOW (ref 36.0–46.0)
HEMOGLOBIN: 7.8 g/dL — AB (ref 12.0–15.0)
MCH: 26.8 pg (ref 26.0–34.0)
MCHC: 29.4 g/dL — AB (ref 30.0–36.0)
MCV: 91.1 fL (ref 78.0–100.0)
PLATELETS: 250 10*3/uL (ref 150–400)
RBC: 2.91 MIL/uL — AB (ref 3.87–5.11)
RDW: 14.1 % (ref 11.5–15.5)
WBC: 7.1 10*3/uL (ref 4.0–10.5)

## 2017-06-08 LAB — GLUCOSE, CAPILLARY
GLUCOSE-CAPILLARY: 124 mg/dL — AB (ref 65–99)
GLUCOSE-CAPILLARY: 123 mg/dL — AB (ref 65–99)
GLUCOSE-CAPILLARY: 119 mg/dL — AB (ref 65–99)
GLUCOSE-CAPILLARY: 162 mg/dL — AB (ref 65–99)

## 2017-06-08 MED ORDER — CYANOCOBALAMIN 1000 MCG/ML IJ SOLN
1000.0000 ug | Freq: Once | INTRAMUSCULAR | Status: AC
  Administered 2017-06-09: 1000 ug via INTRAMUSCULAR
  Filled 2017-06-08: qty 1

## 2017-06-08 MED ORDER — METOPROLOL SUCCINATE ER 25 MG PO TB24
37.5000 mg | ORAL_TABLET | Freq: Every day | ORAL | Status: DC
  Administered 2017-06-09 – 2017-06-10 (×2): 37.5 mg via ORAL
  Filled 2017-06-08 (×2): qty 2

## 2017-06-08 NOTE — Progress Notes (Signed)
   Subjective:  Patient reports pain as mild.  Pain controlled with meds. Denies cp, SOB, n/v, or lightheadedness.  She has had one BM and is passing flatus.  Objective:   VITALS:   Vitals:   06/07/17 0456     BP: (!) 131/55     Pulse: 78     Resp: 18     Temp: 97.6 F (36.4 C)     TempSrc: Oral     SpO2: 99%     Weight:         RUE- Sling in place, incision/bandage c/d/i, no drainage.   +SILT ax/mc/labc/med/uln/rad +motor ax/mc/ain/pin/rad/med/uln 2+ rad pulse  Hgb- 7.5  Assessment/Plan: 3 Days Post-Op   Principal Problem:   Closed fracture of right proximal humerus Active Problems:   CKD (chronic kidney disease), stage III   High cholesterol   Hypertension   NICM (nonischemic cardiomyopathy) ([facility])   Type II diabetes mellitus ([place])   Hypothyroidism   Anemia   Diabetes mellitus with complication ([facility]) - NWB RUE with sling, ok for pendulums and scap retractions and full A/PROM at elbow/hand/wrist  -home eluiqis for Afib and DVT ppx, along with SCDs  - appreciate medicine team help with chronic medical problem management, will defer transfusion to them based on tight I and O's in face of CHF.  Asymptomatic but Hbg- 7.5 this am.  - to SNF likely tomorrow.     Geralynn Rile, MD [phone]

## 2017-06-08 NOTE — Progress Notes (Signed)
[facility] PROGRESS NOTE  Melissa [name] CWC:[id number] DOB: [birth year] DOA: 06/05/2017 PCP: [name], MD  Interim summary and HPI  83 y.o. female with medical history significant for chronic systolic heart failure secondary to NICM with ejection fraction of 15-25%, chronic anemia, chronic kidney disease stage III, dyslipidemia, hypertension, atrial fibrillation on chronic anticoagulation, diabetes and hypothyroidism. Admitted after mechanical fall with right humerus fracture and required ORIF. TRH consulted to assist with management of chronic medical problems.  Assessment/Plan: 1-right humerus fracture: s/p ORIF -further post operative treatment per primary team -keep narcotics as less needed as possible. Patient with episodes of somnolence and lethargy. -recovering appropriately -will need SNF at discharge  2-CKD stage 3 -stable renal function -will follow trend intermittently -advise to maintain adequate hydration  3-HTN -well controlled -will continue toprol   4-chronic systolic HF -last EF 76-16% -continue b-blocker -condition remained compensated and stable -follow daily weight and strict I's and O's  5-type 2 diabetes mellitus -continue SSI and lantus -CBG's remained stable  6-atrial fibrillation -CHADsVASC score 6 -rate controlled overall  -will continue b-blocker -continue eliquis for secondary prevention    7-HLD -Continue statins    8-hypothyroidism -will continue synthroid    9-acute on chronic anemia: from ABL during surgery -patient AOCD stage 3 at baseline from renal failure -Hgb 7.5; no symptoms associated with symptomatic anemia at this point -will repeat CBC in am; if needed will transfuse 1 unit -no signs of acute bleeding -continue niferex -patient chronically received IM B12 monthly; will provide shot tomorrow while here.  10-constipation -had BM overnight -will continue miralax and senokot -Patient with constipation most  likely from pain meds and underlying hx of hypothyroidism  -advise to maintain adequate hydration   Code Status: Full Family Communication: no family at bedside  Disposition Plan: to SNF at discharge. Final dispo per primary team. Chronic medical problems are stable and well controlled.  Procedures:  Right humerus ORIF  Antibiotics:  Ceftin   HPI/Subjective: Afebrile, no CP, no SOB. Still with pain in her right shoulder (even slightly improved). Patient had BM overnight.  Objective: Vitals:   06/07/17 1919 06/08/17 0400  BP: (!) 128/51 (!) 135/56  Pulse: 93 92  Resp: 18 18  Temp: 98.2 F (36.8 C) 98 F (36.7 C)  SpO2: 96% 93%    Intake/Output Summary (Last 24 hours) at 06/08/17 1126 Last data filed at 06/08/17 0752  Gross per 24 hour  Intake            935.5 ml  Output              250 ml  Net            685.5 ml   Filed Weights   06/06/17 0500 06/07/17 1919 06/08/17 0400  Weight: 73.3 kg (161 lb 8 oz) 75.8 kg (167 lb 3.2 oz) 74.4 kg (164 lb)    Exam:   General:  Afebrile, no CP, no SOB. Reports mild to moderate intermittent right shoulder pain. No nausea, no vomiting. Had one BM overnight.   Cardiovascular: slight tachycardia today, no rubs, no gallops, positive SEM  Respiratory: CTA bilaterally   Abdomen: soft, NT, ND, positive BS  Musculoskeletal: no edema, no cyanosis on her LE's. Positive pain and decrease range of motion in her RUE, patient with arm on sling.   Skin: no new rashes, petechiae , induration or open wounds. Right shoulder with clean dressing in place.    Data Reviewed: Basic Metabolic Panel:  Recent Labs Lab 06/03/17 1406 06/06/17 0443 06/07/17 0515  NA 137 136 134*  K 4.2 4.4 4.8  CL 99* 97* 95*  CO2 30 30 31   GLUCOSE 178* 145* 120*  BUN 14 13 17   CREATININE 1.31* 1.22* 1.37*  CALCIUM 8.9 8.6* 8.6*   CBC:  Recent Labs Lab 06/03/17 1406 06/06/17 0443 06/07/17 0515 06/08/17 0636  WBC 8.6 9.8 8.7 7.1  HGB 10.1* 8.0*  8.5* 7.8*  HCT 33.3* 25.8* 27.9* 26.5*  MCV 89.5 89.0 92.7 91.1  PLT 282 250 252 250   BNP (last 3 results)  Recent Labs  10/19/16 1906  BNP 176.9*    CBG:  Recent Labs Lab 06/07/17 0639 06/07/17 1204 06/07/17 1619 06/07/17 2037 06/08/17 0624  GLUCAP 120* 138* 138* 201* 124*    Studies: No results found.  Scheduled Meds: . apixaban  5 mg Oral BID  . atorvastatin  40 mg Oral Daily  . cefUROXime  500 mg Oral BID  . cholecalciferol  1,000 Units Oral Daily  . [START ON 06/09/2017] cyanocobalamin  1,000 mcg Intramuscular Once  . FLUoxetine  20 mg Oral Daily  . insulin aspart  0-15 Units Subcutaneous TID WC  . insulin aspart  0-5 Units Subcutaneous QHS  . insulin detemir  12 Units Subcutaneous Daily  . iron polysaccharides  150 mg Oral Daily  . levothyroxine  50 mcg Oral Daily  . [START ON 06/09/2017] metoprolol succinate  37.5 mg Oral Daily  . omega-3 acid ethyl esters  1 g Oral Daily  . pantoprazole  40 mg Oral Daily  . polyethylene glycol  17 g Oral BID  . pramipexole  0.5 mg Oral QHS  . pregabalin  50 mg Oral TID  . senna  1 tablet Oral BID  . traZODone  100 mg Oral QHS   Continuous Infusions: . lactated ringers 10 mL/hr at 06/05/17 0933  . methocarbamol (ROBAXIN)  IV      Principal Problem:   Closed fracture of right proximal humerus Active Problems:   CKD (chronic kidney disease), stage III   High cholesterol   Hypertension   NICM (nonischemic cardiomyopathy) ([place])   Type II diabetes mellitus ([place])   Hypothyroidism   Anemia   Diabetes mellitus with complication ([place])    Time spent: 25 minutes    Barton Dubois  [facility] Pager [phone]. If 7PM-7AM, please contact night-coverage at [email or web address], password [facility] 06/08/2017, 11:26 AM  LOS: 3 days

## 2017-06-08 NOTE — Progress Notes (Addendum)
Subjective: 3 Days Post-Op Procedure(s) (LRB): OPEN REDUCTION INTERNAL FIXATION (ORIF) PROXIMAL HUMERUS FRACTURE (Right) Patient reports pain as mild to affected joint.  Reports a good night. Tolerating  PO's. Positive flatus. Denies CP, SOB,or calf pain..    Objective: Vital signs in last 24 hours: Temp:  [97.7 F (36.5 C)-98.2 F (36.8 C)] 98 F (36.7 C) (08/26 0400) Pulse Rate:  [86-93] 92 (08/26 0400) Resp:  [18] 18 (08/26 0400) BP: (104-135)/(44-56) 135/56 (08/26 0400) SpO2:  [93 %-98 %] 93 % (08/26 0400) Weight:  [74.4 kg (164 lb)-75.8 kg (167 lb 3.2 oz)] 74.4 kg (164 lb) (08/26 0400)  Intake/Output from previous day: 08/25 0701 - 08/26 0700 In: 575.5 [P.O.:480; I.V.:95.5] Out: 250 [Urine:250] Intake/Output this shift: Total I/O In: 360 [P.O.:360] Out: -    Recent Labs  06/06/17 0443 06/07/17 0515 06/08/17 0636  HGB 8.0* 8.5* 7.8*    Recent Labs  06/07/17 0515 06/08/17 0636  WBC 8.7 7.1  RBC 3.01* 2.91*  HCT 27.9* 26.5*  PLT 252 250    Recent Labs  06/06/17 0443 06/07/17 0515  NA 136 134*  K 4.4 4.8  CL 97* 95*  CO2 30 31  BUN 13 17  CREATININE 1.22* 1.37*  GLUCOSE 145* 120*  CALCIUM 8.6* 8.6*   No results for input(s): LABPT, INR in the last 72 hours.  Well nourished. Alert and oriented x3. RRR, Lungs clear, BS x4. Abdomen soft and non tender. Right Calf soft and non tender. Right shoulder dressing C/D/I. No DVT signs. Compartment soft. No signs of infection.  Right UE grossly neurovascular intact. Edema to hand. 2+ radial pulse and sensation intact. No pain with passive motion.  Assessment/Plan: 3 Days Post-Op Procedure(s) (LRB): OPEN REDUCTION INTERNAL FIXATION (ORIF) PROXIMAL HUMERUS FRACTURE (Right) Plan for SNF tomorrow Elevate hand, wrist and hand movement Family updated SLing Eliquis    Magaly Pollina L 06/08/2017, 9:35 AM

## 2017-06-08 NOTE — Clinical Social Work Note (Signed)
Clinical Social Work Assessment  Patient Details  Name: Melissa [name] MRN: [id number] Date of Birth: March 23, 1928  Date of referral:  06/07/17               Reason for consult:  Facility Placement                Permission sought to share information with:  Family Supports Permission granted to share information::  Yes, Verbal Permission Granted  Name::     [place],[name]  Relationship::  Daughter  Contact Information:  [phone]  Housing/Transportation Living arrangements for the past 2 months:  Single Family Home Source of Information:  Patient, Adult Children Patient Interpreter Needed:  None Criminal Activity/Legal Involvement Pertinent to Current Situation/Hospitalization:  No - Comment as needed Significant Relationships:  Adult Children, Other Family Members Lives with:  Adult Children Do you feel safe going back to the place where you live?  Yes Need for family participation in patient care:  Yes (Comment)  Care giving concerns:  Patient daughter at bedside and concerned about patient being placed out of [place] assured patient daughter the variety of nursing facilities in the area.   Social Worker assessment / plan:  Holiday representative met with patient and patient daughter at bedside to offer support and discuss patient needs at discharge.  Patient currently living at home with her daughter and plans to return home with daughter following a short term rehab stay.  Patient daughter requests placement [facility].  CSW initiated referral to [facility] facilities and contacted [facility] with patient family preference.  [facility] is agreeable with patient admission Monday/Tuesday and will just need family to complete paperwork prior to admission.  CSW remains available for support and to facilitate patient discharge needs once medically stable.  Employment status:  Retired Forensic scientist:  Medicare PT Recommendations:  [place]  / Referral to community resources:  [place]  Patient/Family's Response to care:  Patient and patient daughter verbalize understanding of CSW role and appreciation for support and concern.  Patient and family agreeable with ST-SNF placement.    Patient/Family's Understanding of and Emotional Response to Diagnosis, Current Treatment, and Prognosis:  Patient and family understanding of patient limitations and barriers upon discharge requiring SNF placement.  Patient and family emotionally appropriate and agreeable with discharge plan.  Emotional Assessment Appearance:  Appears stated age Attitude/Demeanor/Rapport:  Lethargic, Other (In pain with limited engagement) Affect (typically observed):  Accepting, Quiet Orientation:  Oriented to Self, Oriented to Situation, Oriented to Place, Oriented to  Time Alcohol / Substance use:  Not Applicable Psych involvement (Current and /or in the community):  No (Comment)  Discharge Needs  Concerns to be addressed:  No discharge needs identified Readmission within the last 30 days:  Yes Current discharge risk:  Physical Impairment, None Barriers to Discharge:  Continued Medical Work up  [employer], [place]

## 2017-06-08 NOTE — Progress Notes (Addendum)
   Subjective:  Patient reports pain as moderate.  Pain controlled with meds. Denies cp, SOB, n/v, or lightheadedness  Objective:   VITALS:   Vitals:   06/07/17 0456     BP: (!) 131/55     Pulse: 78     Resp: 18     Temp: 97.6 F (36.4 C)     TempSrc: Oral     SpO2: 99%     Weight:         RUE- Sling in place, incision/bandage c/d/i, no drainage.   +SILT ax/mc/labc/med/uln/rad +motor ax/mc/ain/pin/rad/med/uln 2+ rad pulse  Hgb- 8.5  Assessment/Plan: 3 Days Post-Op   Principal Problem:   Closed fracture of right proximal humerus Active Problems:   CKD (chronic kidney disease), stage III   High cholesterol   Hypertension   NICM (nonischemic cardiomyopathy) ([facility])   Type II diabetes mellitus ([place])   Hypothyroidism   Anemia   Diabetes mellitus with complication ([facility]) - NWB RUE with sling, ok for pendulums and scap retractions and full A/PROM at elbow/hand/wrist  -home eluiqis for Afib and DVT ppx, along with SCDs  - appreciate medicine team help with chronic medical problem management  - SSI for DM  - monitor fluids and intake for CHF, home meds restarted  - home synthroid for hypothyroidism  - likely needs SNF at dc on Mon or tues.  - acute blood loss anemia, stable now, will follow along with daily cbc today 8.5    Geralynn Rile, MD [phone]

## 2017-06-08 NOTE — Progress Notes (Signed)
Pt c/o of chest pain after taking medications, Vitals B/P 120/43, pulse 90, pulse ox 95% Temp 97.9.  After putting patient back to bed patient belged and stated she felt better. Resting comfortable in bed with no other complains

## 2017-06-08 NOTE — Progress Notes (Signed)
Occupational Therapy Treatment Patient Details Name: Melissa [name] MRN: [id number] DOB: September 16, 1928 Today's Date: 06/08/2017    History of present illness 83 y.o. female who underwent an ORIF of R proximal humerus fx s/p fall off of washing machine. Apparently with B ankle sprains. WBAT.    OT comments  Pt progressing towards goals, able to tolerate wrist and hand AROM exercises in RUE during session. Attempted elbow ROM however Pt is unable to complete due to significant pain with elbow extension. Education provided on edema reduction techniques. Will continue to follow acutely for progression of RUE impairments, ADLs and functional mobility. POC remains appropriate.    Follow Up Recommendations  SNF;Supervision/Assistance - 24 hour    Equipment Recommendations  None recommended by OT          Precautions / Restrictions Precautions Precautions: Shoulder Type of Shoulder Precautions: AROM elbow/wrist/hand/ pendulums/scapular retraction Shoulder Interventions: Shoulder sling/immobilizer;Off for dressing/bathing/exercises Precaution Comments: verbally reviewed  Restrictions Weight Bearing Restrictions: Yes RUE Weight Bearing: Non weight bearing Other Position/Activity Restrictions: WBAT on both LE's       Mobility Bed Mobility               General bed mobility comments: OOB in recliner                         Balance Overall balance assessment: Needs assistance   Sitting balance-Leahy Scale: Fair Sitting balance - Comments: sitting towards edge of recliner for RUE exercises                                   ADL either performed or assessed with clinical judgement   ADL Overall ADL's : Needs assistance/impaired Eating/Feeding: Set up;Sitting               Upper Body Dressing : Sitting;Maximal assistance Upper Body Dressing Details (indicate cue type and reason): doffing/donning sling                    General ADL  Comments: Pt continues to be limited by pain in RUE; focus of session on edema reduction techniques, RUE AROM in hand, wrist, only able to tolerate minimal ROM in elbow as it is very painful to extend                       Cognition Arousal/Alertness: Awake/alert Behavior During Therapy: Flat affect Overall Cognitive Status: Within Functional Limits for tasks assessed                                          Exercises Shoulder Exercises Wrist Flexion: AROM;10 reps;Right;Seated Wrist Extension: AROM;10 reps;Right;Seated Digit Composite Flexion: AROM;10 reps;Right;Seated Composite Extension: AROM;10 reps;Right;Seated Other Exercises Other Exercises: attempted elbow extension, Pt able to extend once (minimally) however very painful, unable to complete additional reps  Other Exercises: forearm pronation AAROM x10 reps           General Comments daughter present during session; significant edema and bruising to RUE     Pertinent Vitals/ Pain       Pain Assessment: 0-10 Pain Score: 7  Pain Location: RUE  Pain Descriptors / Indicators: Aching;Sore;Grimacing;Guarding;Crying Pain Intervention(s): Monitored during session;Ice applied;Limited activity within patient's tolerance  Frequency  Min 3X/week        Progress Toward Goals  OT Goals(current goals can now be found in the care plan section)  Progress towards OT goals: Progressing toward goals  Acute Rehab OT Goals OT Goal Formulation: With patient Time For Goal Achievement: 06/20/17 Potential to Achieve Goals: Good  Plan Discharge plan remains appropriate                     AM-PAC PT "6 Clicks" Daily Activity     Outcome Measure   Help from another person eating meals?: None Help from another person taking care of personal grooming?: A Little Help from another person toileting, which includes using  toliet, bedpan, or urinal?: A Lot Help from another person bathing (including washing, rinsing, drying)?: A Lot Help from another person to put on and taking off regular upper body clothing?: A Lot Help from another person to put on and taking off regular lower body clothing?: A Lot 6 Click Score: 15    End of Session    OT Visit Diagnosis: Unsteadiness on feet (R26.81);Muscle weakness (generalized) (M62.81);History of falling (Z91.81);Pain Pain - Right/Left: Right Pain - part of body: Shoulder   Activity Tolerance Patient limited by pain   Patient Left in chair;with call bell/phone within reach;with family/visitor present   Nurse Communication Patient requests pain meds        Time: 1219-7588 OT Time Calculation (min): 26 min  Charges: OT General Charges $OT Visit: 1 Procedure OT Treatments $Therapeutic Exercise: 8-22 mins  Lou Cal, OT Pager [phone] 06/08/2017    Raymondo Band 06/08/2017, 11:29 AM

## 2017-06-09 ENCOUNTER — Encounter (HOSPITAL_COMMUNITY): Payer: Self-pay | Admitting: *Deleted

## 2017-06-09 LAB — GLUCOSE, CAPILLARY
GLUCOSE-CAPILLARY: 141 mg/dL — AB (ref 65–99)
Glucose-Capillary: 157 mg/dL — ABNORMAL HIGH (ref 65–99)
Glucose-Capillary: 118 mg/dL — ABNORMAL HIGH (ref 65–99)
Glucose-Capillary: 144 mg/dL — ABNORMAL HIGH (ref 65–99)

## 2017-06-09 LAB — BASIC METABOLIC PANEL
Anion gap: 8 (ref 5–15)
BUN: 17 mg/dL (ref 6–20)
CALCIUM: 8.8 mg/dL — AB (ref 8.9–10.3)
CO2: 30 mmol/L (ref 22–32)
Chloride: 99 mmol/L — ABNORMAL LOW (ref 101–111)
Creatinine, Ser: 1.09 mg/dL — ABNORMAL HIGH (ref 0.44–1.00)
GFR calc Af Amer: 51 mL/min — ABNORMAL LOW (ref >60)
GFR, EST NON AFRICAN AMERICAN: 44 mL/min — AB (ref >60)
GLUCOSE: 133 mg/dL — AB (ref 65–99)
Potassium: 4.3 mmol/L (ref 3.5–5.1)
SODIUM: 137 mmol/L (ref 135–145)

## 2017-06-09 LAB — CBC
HCT: 29.4 % — ABNORMAL LOW (ref 36.0–46.0)
Hemoglobin: 9.1 g/dL — ABNORMAL LOW (ref 12.0–15.0)
MCH: 28.3 pg (ref 26.0–34.0)
MCHC: 31 g/dL (ref 30.0–36.0)
MCV: 91.3 fL (ref 78.0–100.0)
PLATELETS: 274 10*3/uL (ref 150–400)
RBC: 3.22 MIL/uL — AB (ref 3.87–5.11)
RDW: 14.3 % (ref 11.5–15.5)
WBC: 8.9 10*3/uL (ref 4.0–10.5)

## 2017-06-09 MED ORDER — WHITE PETROLATUM GEL
Status: AC
  Administered 2017-06-09: 11:00:00
  Filled 2017-06-09: qty 1

## 2017-06-09 MED ORDER — BISACODYL 10 MG RE SUPP
10.0000 mg | Freq: Every day | RECTAL | Status: DC | PRN
  Administered 2017-06-09: 10 mg via RECTAL
  Filled 2017-06-09: qty 1

## 2017-06-09 MED ORDER — BISACODYL 5 MG PO TBEC
5.0000 mg | DELAYED_RELEASE_TABLET | Freq: Every day | ORAL | Status: DC | PRN
  Administered 2017-06-09: 5 mg via ORAL
  Filled 2017-06-09: qty 1

## 2017-06-09 NOTE — Progress Notes (Signed)
Occupational Therapy Treatment Patient Details Name: Melissa [name] MRN: [id number] DOB: Apr 10, 1928 Today's Date: 06/09/2017    History of present illness 83 y.o. female who underwent an ORIF of R proximal humerus fx s/p fall off of washing machine. Apparently with B ankle sprains. WBAT.    OT comments  Pt progressing towards established goals. Pt requiring Min Guard A for toileting and hand hygiene at sink with hemi-walker. Pt continues to require Max A for UB ADLs including sling management. Reviewed all RUE exercise with pt and family - provided written descriptions to increase carry over.  Continue to recommend dc to SNF for further OT to optimize safety and independence with ADLs and functional mobility. Will continues to follow acutely to facilitate safe dc.    Follow Up Recommendations  SNF;Supervision/Assistance - 24 hour    Equipment Recommendations  None recommended by OT    Recommendations for Other Services      Precautions / Restrictions Precautions Precautions: Shoulder Type of Shoulder Precautions: AROM elbow/wrist/hand/ pendulums/scapular retraction Shoulder Interventions: Shoulder sling/immobilizer;Off for dressing/bathing/exercises Precaution Booklet Issued: Yes (comment) Precaution Comments: verbally reviewed  Restrictions Weight Bearing Restrictions: Yes RUE Weight Bearing: Non weight bearing Other Position/Activity Restrictions: WBAT on both LE's       Mobility Bed Mobility Overal bed mobility: Needs Assistance Bed Mobility: Supine to Sit     Supine to sit: Min assist;HOB elevated     General bed mobility comments: OOB in recliner   Transfers Overall transfer level: Needs assistance Equipment used: Hemi-walker Transfers: Sit to/from Stand Sit to Stand: Min guard         General transfer comment: cues for hand placement    Balance Overall balance assessment: Needs assistance   Sitting balance-Leahy Scale: Good Sitting balance - Comments:  sitting towards edge of recliner for RUE exercises   Standing balance support: Single extremity supported Standing balance-Leahy Scale: Fair Standing balance comment: Able to stand at sink during hand hygiene                           ADL either performed or assessed with clinical judgement   ADL Overall ADL's : Needs assistance/impaired     Grooming: Sitting;Wash/dry hands;Min guard (hemi-walker)           Upper Body Dressing : Maximal assistance;Sitting Upper Body Dressing Details (indicate cue type and reason): doffing/doffing/positioning sling      Toilet Transfer: Regular Toilet;Min guard (hemi-walker) Toilet Transfer Details (indicate cue type and reason): Pt on toilet upon arrival. Min guard A for safety.  Toileting- Water quality scientist and Hygiene: Min guard;Sit to/from stand Toileting - Clothing Manipulation Details (indicate cue type and reason): Min Guard for safety in standing. Pt performing toilet hygiene and clothing management without physical A     Functional mobility during ADLs: Min guard (hemi-walker) General ADL Comments: Pt performed toileting and shoulder exercises. contineus to require Max A for UB ADLs. demonstrating increased activity tolerance and was able to participate in all excercises.      Vision       Perception     Praxis      Cognition Arousal/Alertness: Awake/alert Behavior During Therapy: WFL for tasks assessed/performed Overall Cognitive Status: History of cognitive impairments - at baseline (memory deficits)                                 General Comments: most likely  at baseline        Exercises Exercises: Shoulder;Other exercises General Exercises - Upper Extremity Elbow Flexion: AROM;Right;10 reps;Seated Elbow Extension: AROM;Right;10 reps;Seated Wrist Flexion: AROM;Right;10 reps;Seated Wrist Extension: AROM;Right;10 reps;Seated Digit Composite Flexion: AROM;Right;10 reps;Seated Composite  Extension: AROM;Right;10 reps;Seated Shoulder Exercises Pendulum Exercise: PROM;Right;5 reps;Standing (with and without sling) Elbow Flexion: AROM;AAROM;Right;10 reps;Seated Elbow Extension: AROM;AAROM;Right;10 reps;Seated Wrist Flexion: AROM;10 reps;Right;Seated Wrist Extension: AROM;10 reps;Right;Seated Digit Composite Flexion: AROM;10 reps;Right;Seated Composite Extension: AROM;10 reps;Right;Seated Neck Lateral Flexion - Right: AROM;5 reps;Seated Neck Lateral Flexion - Left: AROM;5 reps;Seated Other Exercises Other Exercises: Educated on edema management and elevation.  Other Exercises: forearm pronation/supination AROM x10 reps; seated at EOB    Shoulder Instructions Shoulder Instructions Donning/doffing sling/immobilizer: Maximal assistance Correct positioning of sling/immobilizer: Maximal assistance Pendulum exercises (written home exercise program): Moderate assistance ROM for elbow, wrist and digits of operated UE: Minimal assistance     General Comments son-in-law in the room and present for session    Pertinent Vitals/ Pain       Pain Assessment: Faces Faces Pain Scale: Hurts even more Pain Location: back and neck during ambulation Pain Descriptors / Indicators: Aching Pain Intervention(s): Monitored during session;Limited activity within patient's tolerance;Patient requesting pain meds-RN notified  Home Living                                          Prior Functioning/Environment              Frequency  Min 3X/week        Progress Toward Goals  OT Goals(current goals can now be found in the care plan section)  Progress towards OT goals: Progressing toward goals  Acute Rehab OT Goals Patient Stated Goal: to get better OT Goal Formulation: With patient Time For Goal Achievement: 06/20/17 Potential to Achieve Goals: Good ADL Goals Pt Will Perform Upper Body Bathing: with supervision;with caregiver independent in  assisting;sitting Pt Will Perform Upper Body Dressing: with supervision;sitting;with caregiver independent in assisting Pt Will Transfer to Toilet: with min guard assist;bedside commode;ambulating (with caregiver assisting) Pt/caregiver will Perform Home Exercise Program: Increased ROM;Right Upper extremity;With written HEP provided;With Supervision (per protocol) Additional ADL Goal #1: Pt/family will independently verbalize 3 strategies to reduce risk of falls  Plan Discharge plan remains appropriate    Co-evaluation                 AM-PAC PT "6 Clicks" Daily Activity     Outcome Measure   Help from another person eating meals?: None Help from another person taking care of personal grooming?: A Little Help from another person toileting, which includes using toliet, bedpan, or urinal?: A Little Help from another person bathing (including washing, rinsing, drying)?: A Lot Help from another person to put on and taking off regular upper body clothing?: A Lot Help from another person to put on and taking off regular lower body clothing?: A Lot 6 Click Score: 16    End of Session Equipment Utilized During Treatment: Other (comment) (Hemi-walker)  OT Visit Diagnosis: Unsteadiness on feet (R26.81);Muscle weakness (generalized) (M62.81);History of falling (Z91.81);Pain Pain - Right/Left: Right Pain - part of body: Shoulder   Activity Tolerance Patient tolerated treatment well   Patient Left in chair;with call bell/phone within reach;with family/visitor present   Nurse Communication Mobility status;Patient requests pain meds;Precautions        Time: 8527-7824 OT Time Calculation (min): 32  min  Charges: OT General Charges $OT Visit: 1 Visit OT Treatments $Self Care/Home Management : 8-22 mins $Therapeutic Activity: 8-22 mins  Marieli Rudy MSOT, OTR/L Acute Rehab Pager: [phone] Office: [place] 06/09/2017, 5:11 PM

## 2017-06-09 NOTE — Progress Notes (Signed)
   Subjective:  Patient reports pain as well controlled. . Denies cp, SOB, n/v, or lightheadedness.  She has positive flatus, but continues with constipation and feeling "full" but no abd pain. Objective:   Vitals:   06/09/17 0821 06/09/17 0824  BP: (!) 120/57   Pulse: 100   Resp:    Temp:    SpO2:  97%   CBC    Component Value Date/Time   WBC 8.9 06/09/2017 0430   RBC 3.22 (L) 06/09/2017 0430   HGB 9.1 (L) 06/09/2017 0430   HCT 29.4 (L) 06/09/2017 0430   PLT 274 06/09/2017 0430   MCV 91.3 06/09/2017 0430   MCV 83.4 09/26/2015 1800   MCH 28.3 06/09/2017 0430   MCHC 31.0 06/09/2017 0430   RDW 14.3 06/09/2017 0430   LYMPHSABS 1.7 10/23/2016 0935   MONOABS 0.8 10/23/2016 0935   EOSABS 0.2 10/23/2016 0935   BASOSABS 0.0 10/23/2016 0935   BMP Latest Ref Rng & Units 06/09/2017 06/07/2017 06/06/2017  Glucose 65 - 99 mg/dL 133(H) 120(H) 145(H)  BUN 6 - 20 mg/dL 17 17 13   Creatinine 0.44 - 1.00 mg/dL 1.09(H) 1.37(H) 1.22(H)  Sodium 135 - 145 mmol/L 137 134(L) 136  Potassium 3.5 - 5.1 mmol/L 4.3 4.8 4.4  Chloride 101 - 111 mmol/L 99(L) 95(L) 97(L)  CO2 22 - 32 mmol/L 30 31 30   Calcium 8.9 - 10.3 mg/dL 8.8(L) 8.6(L) 8.6(L)       RUE- Sling in place, incision/bandage c/d/i, no drainage.   +SILT ax/mc/labc/med/uln/rad +motor ax/mc/ain/pin/rad/med/uln 2+ rad pulse  Abdomen is soft and nontender, +BS  Hgb- 9.1 up from 7.5  Assessment/Plan: 4 Days Post-Op   Principal Problem:   Closed fracture of right proximal humerus Active Problems:   CKD (chronic kidney disease), stage III   High cholesterol   Hypertension   NICM (nonischemic cardiomyopathy) (HCC)   Type II diabetes mellitus ([place])   Hypothyroidism   Anemia   Diabetes mellitus with complication (HCC) - NWB RUE with sling, ok for pendulums and scap retractions and full A/PROM at elbow/hand/wrist  -home eluiqis for Afib and DVT ppx, along with SCDs  - appreciate medicine team help with chronic medical problem  management, will defer transfusion to them based on tight I and O's in face of CHF.  Hgb trending up today  -dulcolax suppository today and prn for consitpation  - to SNF tomorrow if BM today/tonight.     Geralynn Rile, MD [phone]

## 2017-06-09 NOTE — Progress Notes (Signed)
Physical Therapy Treatment Patient Details Name: Melissa [name] MRN: [id number] DOB: 04-22-1928 Today's Date: 06/09/2017    History of Present Illness 83 y.o. female who underwent an ORIF of R proximal humerus fx s/p fall off of washing machine. Apparently with B ankle sprains. WBAT.     PT Comments    Patient progressing with ambulation tolerance, distance and balance with three point pattern as opposed to two pt patient started with.  Patient fatigues with ambulation due to pull of the R arm on neck and back.  Continued skilled PT needed for safety, balance, strengthening and remains appropriate for SNF level rehab at d/c.   Follow Up Recommendations  SNF;Supervision/Assistance - 24 hour     Equipment Recommendations  Other (comment) (TBA)    Recommendations for Other Services       Precautions / Restrictions Precautions Precautions: Shoulder Shoulder Interventions: Shoulder sling/immobilizer;Off for dressing/bathing/exercises Restrictions Weight Bearing Restrictions: Yes RUE Weight Bearing: Non weight bearing Other Position/Activity Restrictions: WBAT on both LE's    Mobility  Bed Mobility               General bed mobility comments: OOB in recliner   Transfers Overall transfer level: Needs assistance Equipment used: Hemi-walker Transfers: Sit to/from Stand Sit to Stand: Min assist         General transfer comment: cues for hand placement  Ambulation/Gait Ambulation/Gait assistance: Min assist Ambulation Distance (Feet): 120 Feet Assistive device: Hemi-walker Gait Pattern/deviations: Step-to pattern;Decreased stride length;Trunk flexed;Wide base of support     General Gait Details: intially picking up walker during stepping, then cues and assist for three point pattern and pt able to sequence with practice, assist for balance, cues and several stops for improved upright posture   Stairs            Wheelchair Mobility    Modified Rankin  (Stroke Patients Only)       Balance Overall balance assessment: Needs assistance   Sitting balance-Leahy Scale: Good     Standing balance support: Single extremity supported Standing balance-Leahy Scale: Poor Standing balance comment: able to stand with S with hand on walker                            Cognition Arousal/Alertness: Awake/alert Behavior During Therapy: Flat affect Overall Cognitive Status: History of cognitive impairments - at baseline (memory deficits)                                        Exercises      General Comments General comments (skin integrity, edema, etc.): family in room; R UE supported on pillows and loosened sling at rest      Pertinent Vitals/Pain Faces Pain Scale: Hurts even more Pain Location: back and neck during ambulation Pain Descriptors / Indicators: Aching Pain Intervention(s): Monitored during session;Repositioned    Home Living                      Prior Function            PT Goals (current goals can now be found in the care plan section) Progress towards PT goals: Progressing toward goals    Frequency    Min 3X/week      PT Plan Current plan remains appropriate    Co-evaluation  AM-PAC PT "6 Clicks" Daily Activity  Outcome Measure  Difficulty turning over in bed (including adjusting bedclothes, sheets and blankets)?: A Lot Difficulty moving from lying on back to sitting on the side of the bed? : Unable Difficulty sitting down on and standing up from a chair with arms (e.g., wheelchair, bedside commode, etc,.)?: Unable Help needed moving to and from a bed to chair (including a wheelchair)?: A Little Help needed walking in hospital room?: A Little Help needed climbing 3-5 steps with a railing? : A Lot 6 Click Score: 12    End of Session Equipment Utilized During Treatment: Gait belt;Other (comment) (R UE sling) Activity Tolerance: Patient tolerated  treatment well Patient left: in chair;with call bell/phone within reach;with family/visitor present   PT Visit Diagnosis: History of falling (Z91.81);Other abnormalities of gait and mobility (R26.89);Unsteadiness on feet (R26.81)     Time: 0263-7858 PT Time Calculation (min) (ACUTE ONLY): 23 min  Charges:  $Gait Training: 23-37 mins                    G CodesMagda [name], [place] 06/09/2017    Melissa [name] 06/09/2017, 9:59 AM

## 2017-06-09 NOTE — Progress Notes (Signed)
[facility] PROGRESS NOTE  ZEPHANIAH ENYEART WUJ:[id number] DOB: 1928/10/03 DOA: 06/05/2017 PCP: [name], MD  Interim summary and HPI  83 y.o. female with medical history significant for chronic systolic heart failure secondary to NICM with ejection fraction of 15-25%, chronic anemia, chronic kidney disease stage III, dyslipidemia, hypertension, atrial fibrillation on chronic anticoagulation, diabetes and hypothyroidism. Admitted after mechanical fall with right humerus fracture and required ORIF. [facility] consulted to assist with management of chronic medical problems.  Assessment/Plan: 1-right humerus fracture: s/p ORIF -further post operative treatment per primary team -keep narcotics as less needed as possible. Patient with episodes of somnolence and lethargy. -recovering appropriately -will need SNF at discharge; if remains stable and move her bowels, most likely to SNF in am  2-CKD stage 3 -stable renal function -will follow trend intermittently -advise to maintain adequate hydration  3-HTN -well controlled -will continue toprol   4-chronic systolic HF -last EF 95-62% -continue b-blocker -condition remained compensated and stable -follow daily weight and strict I's and O's  5-type 2 diabetes mellitus -continue SSI and lantus -CBG's remained stable  6-atrial fibrillation -CHADsVASC score 6 -rate controlled overall  -will continue b-blocker -continue eliquis for secondary prevention    7-HLD -Continue statins    8-hypothyroidism -will continue synthroid    9-acute on chronic anemia: from ABL during surgery -patient AOCD stage 3 at baseline from renal failure -Hgb 9.1; no symptoms associated with symptomatic anemia at this point -no transfusion required  -no signs of acute bleeding -continue niferex -patient chronically received IM B12 monthly; shot given today  10-constipation -patient reported to me only passing gas; but not BM yet. -will continue  miralax and senokot BID -will also at dulcolax suppository  -Patient with constipation most likely from pain meds and underlying hx of hypothyroidism  -advise to maintain adequate hydration   Code Status: Full Family Communication: no family at bedside  Disposition Plan: to SNF at discharge. Final dispo per primary team. Chronic medical problems are stable and well controlled.  Procedures:  Right humerus ORIF  Antibiotics:  Ceftin   HPI/Subjective: Afebrile, no CP, no SOB. Patient reported no having a small BM as expressed by nurses yesterday. Passing flatus, no nausea, no vomiting.   Objective: Vitals:   06/09/17 0821 06/09/17 0824  BP: (!) 120/57   Pulse: 100   Resp:    Temp:    SpO2:  97%    Intake/Output Summary (Last 24 hours) at 06/09/17 1830 Last data filed at 06/09/17 1700  Gross per 24 hour  Intake              960 ml  Output              850 ml  Net              110 ml   Filed Weights   06/07/17 1919 06/08/17 0400 06/09/17 0300  Weight: 75.8 kg (167 lb 3.2 oz) 74.4 kg (164 lb) 75.3 kg (166 lb)    Exam:   General:  Afebrile, no CP, no SOB. Reports feeling full and has not achieved BM yet. no nausea, no vomiting and passing flatus. Still with intermittent shoulder pain.   Cardiovascular: RRR, no rubs, no gallops, positive SEM  Respiratory: CTA bilaterally    Abdomen: soft, NT, ND, positive BS; per patient feeling, some bloating and fullness sensation.  Musculoskeletal: no edema, no cyanosis on her LE's. Positive pain and decrease range of motion in her RUE, patient with arm  on sling.   Skin: no new rashes, petechiae , induration or open wounds. Right shoulder with clean dressing in place.    Data Reviewed: Basic Metabolic Panel:  Recent Labs Lab 06/03/17 1406 06/06/17 0443 06/07/17 0515 06/09/17 0430  NA 137 136 134* 137  K 4.2 4.4 4.8 4.3  CL 99* 97* 95* 99*  CO2 30 30 31 30   GLUCOSE 178* 145* 120* 133*  BUN 14 13 17 17   CREATININE  1.31* 1.22* 1.37* 1.09*  CALCIUM 8.9 8.6* 8.6* 8.8*   CBC:  Recent Labs Lab 06/03/17 1406 06/06/17 0443 06/07/17 0515 06/08/17 0636 06/09/17 0430  WBC 8.6 9.8 8.7 7.1 8.9  HGB 10.1* 8.0* 8.5* 7.8* 9.1*  HCT 33.3* 25.8* 27.9* 26.5* 29.4*  MCV 89.5 89.0 92.7 91.1 91.3  PLT 282 250 252 250 274   BNP (last 3 results)  Recent Labs  10/19/16 1906  BNP 176.9*    CBG:  Recent Labs Lab 06/08/17 1625 06/08/17 2045 06/09/17 0622 06/09/17 1209 06/09/17 1633  GLUCAP 119* 162* 141* 157* 118*    Studies: No results found.  Scheduled Meds: . apixaban  5 mg Oral BID  . atorvastatin  40 mg Oral Daily  . cefUROXime  500 mg Oral BID  . cholecalciferol  1,000 Units Oral Daily  . FLUoxetine  20 mg Oral Daily  . insulin aspart  0-15 Units Subcutaneous TID WC  . insulin aspart  0-5 Units Subcutaneous QHS  . insulin detemir  12 Units Subcutaneous Daily  . iron polysaccharides  150 mg Oral Daily  . levothyroxine  50 mcg Oral Daily  . metoprolol succinate  37.5 mg Oral Daily  . omega-3 acid ethyl esters  1 g Oral Daily  . pantoprazole  40 mg Oral Daily  . polyethylene glycol  17 g Oral BID  . pramipexole  0.5 mg Oral QHS  . pregabalin  50 mg Oral TID  . senna  1 tablet Oral BID  . traZODone  100 mg Oral QHS   Continuous Infusions: . lactated ringers 10 mL/hr at 06/05/17 0933  . methocarbamol (ROBAXIN)  IV      Principal Problem:   Closed fracture of right proximal humerus Active Problems:   CKD (chronic kidney disease), stage III   High cholesterol   Hypertension   NICM (nonischemic cardiomyopathy) ([place])   Type II diabetes mellitus ([place])   Hypothyroidism   Anemia   Diabetes mellitus with complication ([place])    Time spent: 25 minutes    Barton Dubois  [facility] Pager [phone]. If 7PM-7AM, please contact night-coverage at [email or web address], password [facility] 06/09/2017, 6:30 PM  LOS: 4 days

## 2017-06-09 NOTE — Social Work (Addendum)
Passr confirmed#[phone]. CSW confirmed bed acceptance for [facility].  CSW will f/u for DC to facility when ready.  1:19 CSW spoke with daughter and she confirmed the SNF- [facility] at DC.   Elissa Hefty, LCSW Clinical Social Worker [phone]

## 2017-06-09 NOTE — Care Management Important Message (Signed)
Important Message  Patient Details  Name: Melissa [name] MRN: [id number] Date of Birth: 06-10-1928   Medicare Important Message Given:  Yes    [name] 06/09/2017, 2:04 PM

## 2017-06-10 DIAGNOSIS — I428 Other cardiomyopathies: Secondary | ICD-10-CM | POA: Diagnosis not present

## 2017-06-10 DIAGNOSIS — R2689 Other abnormalities of gait and mobility: Secondary | ICD-10-CM | POA: Diagnosis not present

## 2017-06-10 DIAGNOSIS — R278 Other lack of coordination: Secondary | ICD-10-CM | POA: Diagnosis not present

## 2017-06-10 DIAGNOSIS — M6281 Muscle weakness (generalized): Secondary | ICD-10-CM | POA: Diagnosis not present

## 2017-06-10 DIAGNOSIS — E119 Type 2 diabetes mellitus without complications: Secondary | ICD-10-CM | POA: Diagnosis not present

## 2017-06-10 DIAGNOSIS — Z4789 Encounter for other orthopedic aftercare: Secondary | ICD-10-CM | POA: Diagnosis not present

## 2017-06-10 DIAGNOSIS — E7800 Pure hypercholesterolemia, unspecified: Secondary | ICD-10-CM | POA: Diagnosis not present

## 2017-06-10 DIAGNOSIS — E039 Hypothyroidism, unspecified: Secondary | ICD-10-CM | POA: Diagnosis not present

## 2017-06-10 DIAGNOSIS — M89123 Partial physeal arrest, right proximal humerus: Secondary | ICD-10-CM | POA: Diagnosis not present

## 2017-06-10 DIAGNOSIS — S42211D Unspecified displaced fracture of surgical neck of right humerus, subsequent encounter for fracture with routine healing: Secondary | ICD-10-CM | POA: Diagnosis not present

## 2017-06-10 DIAGNOSIS — E1122 Type 2 diabetes mellitus with diabetic chronic kidney disease: Secondary | ICD-10-CM | POA: Diagnosis not present

## 2017-06-10 DIAGNOSIS — I10 Essential (primary) hypertension: Secondary | ICD-10-CM | POA: Diagnosis not present

## 2017-06-10 DIAGNOSIS — S93401D Sprain of unspecified ligament of right ankle, subsequent encounter: Secondary | ICD-10-CM | POA: Diagnosis not present

## 2017-06-10 DIAGNOSIS — E785 Hyperlipidemia, unspecified: Secondary | ICD-10-CM | POA: Diagnosis not present

## 2017-06-10 DIAGNOSIS — R112 Nausea with vomiting, unspecified: Secondary | ICD-10-CM | POA: Diagnosis not present

## 2017-06-10 DIAGNOSIS — Z5181 Encounter for therapeutic drug level monitoring: Secondary | ICD-10-CM | POA: Diagnosis not present

## 2017-06-10 DIAGNOSIS — I5022 Chronic systolic (congestive) heart failure: Secondary | ICD-10-CM | POA: Diagnosis not present

## 2017-06-10 DIAGNOSIS — I5023 Acute on chronic systolic (congestive) heart failure: Secondary | ICD-10-CM | POA: Diagnosis not present

## 2017-06-10 DIAGNOSIS — D649 Anemia, unspecified: Secondary | ICD-10-CM | POA: Diagnosis not present

## 2017-06-10 DIAGNOSIS — S42221D 2-part displaced fracture of surgical neck of right humerus, subsequent encounter for fracture with routine healing: Secondary | ICD-10-CM | POA: Diagnosis not present

## 2017-06-10 DIAGNOSIS — N183 Chronic kidney disease, stage 3 (moderate): Secondary | ICD-10-CM | POA: Diagnosis not present

## 2017-06-10 DIAGNOSIS — K219 Gastro-esophageal reflux disease without esophagitis: Secondary | ICD-10-CM | POA: Diagnosis not present

## 2017-06-10 DIAGNOSIS — I480 Paroxysmal atrial fibrillation: Secondary | ICD-10-CM | POA: Diagnosis not present

## 2017-06-10 DIAGNOSIS — F339 Major depressive disorder, recurrent, unspecified: Secondary | ICD-10-CM | POA: Diagnosis not present

## 2017-06-10 DIAGNOSIS — S42309A Unspecified fracture of shaft of humerus, unspecified arm, initial encounter for closed fracture: Secondary | ICD-10-CM | POA: Diagnosis not present

## 2017-06-10 DIAGNOSIS — G8911 Acute pain due to trauma: Secondary | ICD-10-CM | POA: Diagnosis not present

## 2017-06-10 DIAGNOSIS — T8453XD Infection and inflammatory reaction due to internal right knee prosthesis, subsequent encounter: Secondary | ICD-10-CM | POA: Diagnosis not present

## 2017-06-10 LAB — GLUCOSE, CAPILLARY
Glucose-Capillary: 130 mg/dL — ABNORMAL HIGH (ref 65–99)
Glucose-Capillary: 149 mg/dL — ABNORMAL HIGH (ref 65–99)

## 2017-06-10 MED ORDER — APIXABAN 5 MG PO TABS: 5.0000 mg | ORAL_TABLET | Freq: Two times a day (BID) | ORAL | 1 refills | Status: DC

## 2017-06-10 MED ORDER — POLYSACCHARIDE IRON COMPLEX 150 MG PO CAPS: 150.0000 mg | ORAL_CAPSULE | Freq: Every day | ORAL | 0 refills | Status: DC

## 2017-06-10 MED ORDER — BISACODYL 5 MG PO TBEC: 5.0000 mg | DELAYED_RELEASE_TABLET | Freq: Every day | ORAL | 0 refills | Status: DC | PRN

## 2017-06-10 NOTE — Progress Notes (Signed)
Patient chart reviewed. VS stable and no complaints overnight. Patient accomplished multiple BM's overnight. Will recommend continue use of miralax, sonokot and PRN dulcolax. Continue the rest of her medications for chronic medical condition and minimize narcotics drugs for pain control. From IM standpoint ok for patient to be discharge to SNF.  Barton Dubois MD [phone]
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
## 2017-06-10 NOTE — Progress Notes (Signed)
   Subjective:  Patient reports pain as well controlled. . Denies cp, SOB, n/v, or lightheadedness.  Multiple BMs yesterday.  Objective:   Vitals:   06/09/17 2019 06/10/17 0420  BP: (!) 121/106 (!) 149/63  Pulse: 92 67  Resp: 20 18  Temp: 98.2 F (36.8 C) 98.1 F (36.7 C)  SpO2: 97% 96%   CBC    Component Value Date/Time   WBC 8.9 06/09/2017 0430   RBC 3.22 (L) 06/09/2017 0430   HGB 9.1 (L) 06/09/2017 0430   HCT 29.4 (L) 06/09/2017 0430   PLT 274 06/09/2017 0430   MCV 91.3 06/09/2017 0430   MCV 83.4 09/26/2015 1800   MCH 28.3 06/09/2017 0430   MCHC 31.0 06/09/2017 0430   RDW 14.3 06/09/2017 0430   LYMPHSABS 1.7 10/23/2016 0935   MONOABS 0.8 10/23/2016 0935   EOSABS 0.2 10/23/2016 0935   BASOSABS 0.0 10/23/2016 0935   BMP Latest Ref Rng & Units 06/09/2017 06/07/2017 06/06/2017  Glucose 65 - 99 mg/dL 133(H) 120(H) 145(H)  BUN 6 - 20 mg/dL 17 17 13   Creatinine 0.44 - 1.00 mg/dL 1.09(H) 1.37(H) 1.22(H)  Sodium 135 - 145 mmol/L 137 134(L) 136  Potassium 3.5 - 5.1 mmol/L 4.3 4.8 4.4  Chloride 101 - 111 mmol/L 99(L) 95(L) 97(L)  CO2 22 - 32 mmol/L 30 31 30   Calcium 8.9 - 10.3 mg/dL 8.8(L) 8.6(L) 8.6(L)       RUE- Sling in place, incision/bandage c/d/i, no drainage.   +SILT ax/mc/labc/med/uln/rad +motor ax/mc/ain/pin/rad/med/uln 2+ rad pulse  Abdomen is soft and nontender, +BS    Assessment/Plan: 5 Days Post-Op   Principal Problem:   Closed fracture of right proximal humerus Active Problems:   CKD (chronic kidney disease), stage III   High cholesterol   Hypertension   NICM (nonischemic cardiomyopathy) (HCC)   Type II diabetes mellitus ([place])   Hypothyroidism   Anemia   Diabetes mellitus with complication (HCC) - NWB RUE with sling, ok for pendulums and scap retractions and full A/PROM at elbow/hand/wrist  -home eluiqis for Afib and DVT ppx, along with SCDs  - appreciate medicine team help with chronic medical problem management, will defer transfusion  to them based on tight I and O's in face of CHF.  Hgb trending up today  -dulcolax suppository prn, but now moving bowels without issue.  - to SNF today    Geralynn Rile, MD [phone]

## 2017-06-10 NOTE — Social Work (Addendum)
Clinical Social Worker facilitated patient discharge including contacting patient family and facility to confirm patient discharge plans.  Clinical information faxed to facility and family agreeable with plan.    CSW arranged ambulance transport via [facility] to [facility].    RN to call [phone] to give report prior to discharge.  Clinical Social Worker will sign off for now as social work intervention is no longer needed. Please consult us again if new need arises.  Kosisochukwu Burningham, LCSW Clinical Social Worker [phone]    

## 2017-06-10 NOTE — Progress Notes (Signed)
Reviewed AVS with patient.  Answered her questions. Called report to [facility] and talked to [employer]. Pt is stable and ready for discharge. Waiting on transport.

## 2017-06-10 NOTE — Clinical Social Work Placement (Signed)
   CLINICAL SOCIAL WORK PLACEMENT  NOTE  Date:  06/10/2017  Patient Details  Name: [name] MRN: [id number] Date of Birth: 09-Mar-1928  Clinical Social Work is seeking post-discharge placement for this patient at the [place] level of care (*CSW will initial, date and re-position this form in  chart as items are completed):  Yes   Patient/family provided with [facility] Work Department's list of facilities offering this level of care within the geographic area requested by the patient (or if unable, by the patient's family).  Yes   Patient/family informed of their freedom to choose among providers that offer the needed level of care, that participate in Medicare, Medicaid or managed care program needed by the patient, have an available bed and are willing to accept the patient.  Yes   Patient/family informed of [facility]'s ownership interest in [facility] and [facility], as well as of the fact that they are under no obligation to receive care at these facilities.  PASRR submitted to EDS on 06/07/17     PASRR number received on 06/07/17     Existing PASRR number confirmed on       FL2 transmitted to all facilities in geographic area requested by pt/family on 06/07/17     FL2 transmitted to all facilities within larger geographic area on       Patient informed that his/her managed care company has contracts with or will negotiate with certain facilities, including the following:        Yes   Patient/family informed of bed offers received.  Patient chooses bed at [facility]     Physician recommends and patient chooses bed at      Patient to be transferred to [facility] on 06/10/17.  Patient to be transferred to facility by [facility]     Patient family notified on 06/10/17 of transfer.  Name of family member notified:  daughter and son-in-law      PHYSICIAN Please sign FL2     Additional Comment:     _______________________________________________ Normajean Baxter, LCSW 06/10/2017, 1:04 PM

## 2017-06-11 DIAGNOSIS — M89123 Partial physeal arrest, right proximal humerus: Secondary | ICD-10-CM | POA: Diagnosis not present

## 2017-06-11 DIAGNOSIS — I10 Essential (primary) hypertension: Secondary | ICD-10-CM | POA: Diagnosis not present

## 2017-06-11 DIAGNOSIS — E039 Hypothyroidism, unspecified: Secondary | ICD-10-CM | POA: Diagnosis not present

## 2017-06-11 DIAGNOSIS — E119 Type 2 diabetes mellitus without complications: Secondary | ICD-10-CM | POA: Diagnosis not present

## 2017-06-11 DIAGNOSIS — S42211D Unspecified displaced fracture of surgical neck of right humerus, subsequent encounter for fracture with routine healing: Secondary | ICD-10-CM | POA: Diagnosis not present

## 2017-06-11 DIAGNOSIS — I5022 Chronic systolic (congestive) heart failure: Secondary | ICD-10-CM | POA: Diagnosis not present

## 2017-06-11 DIAGNOSIS — I480 Paroxysmal atrial fibrillation: Secondary | ICD-10-CM | POA: Diagnosis not present

## 2017-06-11 DIAGNOSIS — E1122 Type 2 diabetes mellitus with diabetic chronic kidney disease: Secondary | ICD-10-CM | POA: Diagnosis not present

## 2017-06-11 DIAGNOSIS — E7800 Pure hypercholesterolemia, unspecified: Secondary | ICD-10-CM | POA: Diagnosis not present

## 2017-06-13 DIAGNOSIS — R112 Nausea with vomiting, unspecified: Secondary | ICD-10-CM | POA: Diagnosis not present

## 2017-06-13 DIAGNOSIS — S93401D Sprain of unspecified ligament of right ankle, subsequent encounter: Secondary | ICD-10-CM | POA: Diagnosis not present

## 2017-06-13 DIAGNOSIS — S42211D Unspecified displaced fracture of surgical neck of right humerus, subsequent encounter for fracture with routine healing: Secondary | ICD-10-CM | POA: Diagnosis not present

## 2017-06-13 DIAGNOSIS — I5022 Chronic systolic (congestive) heart failure: Secondary | ICD-10-CM | POA: Diagnosis not present

## 2017-06-17 ENCOUNTER — Other Ambulatory Visit: Payer: Self-pay | Admitting: *Deleted

## 2017-06-17 NOTE — Patient Outreach (Signed)
[facility]) Care Management  06/17/2017  Melissa [name] 06/27/1928 [id number]   Met with SW at facility. She reports patient lives with daughter and plan is to return to same situation. RNCM let Peggy, SW know patient is eligible for [facility] care management.   Attempted to meet with patient, she was not in room or rehab.  Plan to follow up at next facility visit.  Royetta Crochet. Laymond Purser, RN, BSN, [place] [phone]) Business Cell  [phone]) Toll Free Office

## 2017-06-19 ENCOUNTER — Ambulatory Visit: Payer: Medicare Other

## 2017-06-19 DIAGNOSIS — S42221D 2-part displaced fracture of surgical neck of right humerus, subsequent encounter for fracture with routine healing: Secondary | ICD-10-CM | POA: Diagnosis not present

## 2017-06-20 DIAGNOSIS — I5022 Chronic systolic (congestive) heart failure: Secondary | ICD-10-CM | POA: Diagnosis not present

## 2017-06-20 DIAGNOSIS — S93401D Sprain of unspecified ligament of right ankle, subsequent encounter: Secondary | ICD-10-CM | POA: Diagnosis not present

## 2017-06-20 DIAGNOSIS — S42211D Unspecified displaced fracture of surgical neck of right humerus, subsequent encounter for fracture with routine healing: Secondary | ICD-10-CM | POA: Diagnosis not present

## 2017-06-20 DIAGNOSIS — E1122 Type 2 diabetes mellitus with diabetic chronic kidney disease: Secondary | ICD-10-CM | POA: Diagnosis not present

## 2017-06-21 ENCOUNTER — Other Ambulatory Visit: Payer: Self-pay | Admitting: Endocrinology

## 2017-06-24 DIAGNOSIS — S42201D Unspecified fracture of upper end of right humerus, subsequent encounter for fracture with routine healing: Secondary | ICD-10-CM | POA: Diagnosis not present

## 2017-06-24 DIAGNOSIS — I130 Hypertensive heart and chronic kidney disease with heart failure and stage 1 through stage 4 chronic kidney disease, or unspecified chronic kidney disease: Secondary | ICD-10-CM | POA: Diagnosis not present

## 2017-06-25 ENCOUNTER — Other Ambulatory Visit: Payer: Self-pay | Admitting: *Deleted

## 2017-06-25 NOTE — Patient Outreach (Signed)
[facility]) Care Management  06/25/2017  RANYA FIDDLER [birth year] [id number]  Met with Peggy SW at facilty. She reports patient discharged home 9/10 with home care.  NO [facility] care management needs assessed.  Will sign off. Royetta Crochet. Laymond Purser, RN, BSN, [place] [phone]) Business Cell  [phone]) Toll Free Office

## 2017-06-26 DIAGNOSIS — I130 Hypertensive heart and chronic kidney disease with heart failure and stage 1 through stage 4 chronic kidney disease, or unspecified chronic kidney disease: Secondary | ICD-10-CM | POA: Diagnosis not present

## 2017-06-26 DIAGNOSIS — S42201D Unspecified fracture of upper end of right humerus, subsequent encounter for fracture with routine healing: Secondary | ICD-10-CM | POA: Diagnosis not present

## 2017-06-30 ENCOUNTER — Other Ambulatory Visit: Payer: Self-pay | Admitting: Endocrinology

## 2017-07-01 DIAGNOSIS — I130 Hypertensive heart and chronic kidney disease with heart failure and stage 1 through stage 4 chronic kidney disease, or unspecified chronic kidney disease: Secondary | ICD-10-CM | POA: Diagnosis not present

## 2017-07-01 DIAGNOSIS — S42201D Unspecified fracture of upper end of right humerus, subsequent encounter for fracture with routine healing: Secondary | ICD-10-CM | POA: Diagnosis not present

## 2017-07-02 ENCOUNTER — Other Ambulatory Visit: Payer: Self-pay | Admitting: Endocrinology

## 2017-07-03 DIAGNOSIS — S42201D Unspecified fracture of upper end of right humerus, subsequent encounter for fracture with routine healing: Secondary | ICD-10-CM | POA: Diagnosis not present

## 2017-07-03 DIAGNOSIS — I130 Hypertensive heart and chronic kidney disease with heart failure and stage 1 through stage 4 chronic kidney disease, or unspecified chronic kidney disease: Secondary | ICD-10-CM | POA: Diagnosis not present

## 2017-07-04 DIAGNOSIS — I130 Hypertensive heart and chronic kidney disease with heart failure and stage 1 through stage 4 chronic kidney disease, or unspecified chronic kidney disease: Secondary | ICD-10-CM | POA: Diagnosis not present

## 2017-07-04 DIAGNOSIS — S42201D Unspecified fracture of upper end of right humerus, subsequent encounter for fracture with routine healing: Secondary | ICD-10-CM | POA: Diagnosis not present

## 2017-07-05 DIAGNOSIS — S42201D Unspecified fracture of upper end of right humerus, subsequent encounter for fracture with routine healing: Secondary | ICD-10-CM | POA: Diagnosis not present

## 2017-07-05 DIAGNOSIS — I130 Hypertensive heart and chronic kidney disease with heart failure and stage 1 through stage 4 chronic kidney disease, or unspecified chronic kidney disease: Secondary | ICD-10-CM | POA: Diagnosis not present

## 2017-07-07 DIAGNOSIS — I130 Hypertensive heart and chronic kidney disease with heart failure and stage 1 through stage 4 chronic kidney disease, or unspecified chronic kidney disease: Secondary | ICD-10-CM | POA: Diagnosis not present

## 2017-07-07 DIAGNOSIS — S42201D Unspecified fracture of upper end of right humerus, subsequent encounter for fracture with routine healing: Secondary | ICD-10-CM | POA: Diagnosis not present

## 2017-07-08 DIAGNOSIS — S42201D Unspecified fracture of upper end of right humerus, subsequent encounter for fracture with routine healing: Secondary | ICD-10-CM | POA: Diagnosis not present

## 2017-07-08 DIAGNOSIS — I130 Hypertensive heart and chronic kidney disease with heart failure and stage 1 through stage 4 chronic kidney disease, or unspecified chronic kidney disease: Secondary | ICD-10-CM | POA: Diagnosis not present

## 2017-07-15 DIAGNOSIS — I130 Hypertensive heart and chronic kidney disease with heart failure and stage 1 through stage 4 chronic kidney disease, or unspecified chronic kidney disease: Secondary | ICD-10-CM | POA: Diagnosis not present

## 2017-07-15 DIAGNOSIS — S42201D Unspecified fracture of upper end of right humerus, subsequent encounter for fracture with routine healing: Secondary | ICD-10-CM | POA: Diagnosis not present

## 2017-07-17 DIAGNOSIS — S42221D 2-part displaced fracture of surgical neck of right humerus, subsequent encounter for fracture with routine healing: Secondary | ICD-10-CM | POA: Diagnosis not present

## 2017-07-22 DIAGNOSIS — I130 Hypertensive heart and chronic kidney disease with heart failure and stage 1 through stage 4 chronic kidney disease, or unspecified chronic kidney disease: Secondary | ICD-10-CM | POA: Diagnosis not present

## 2017-07-22 DIAGNOSIS — S42201D Unspecified fracture of upper end of right humerus, subsequent encounter for fracture with routine healing: Secondary | ICD-10-CM | POA: Diagnosis not present

## 2017-07-23 ENCOUNTER — Other Ambulatory Visit: Payer: Self-pay

## 2017-07-23 DIAGNOSIS — S42201D Unspecified fracture of upper end of right humerus, subsequent encounter for fracture with routine healing: Secondary | ICD-10-CM | POA: Diagnosis not present

## 2017-07-23 DIAGNOSIS — I130 Hypertensive heart and chronic kidney disease with heart failure and stage 1 through stage 4 chronic kidney disease, or unspecified chronic kidney disease: Secondary | ICD-10-CM | POA: Diagnosis not present

## 2017-07-23 MED ORDER — INSULIN PEN NEEDLE 32G X 4 MM MISC
1 refills | Status: DC
Start: 2017-07-23 — End: 2020-05-29

## 2017-07-30 DIAGNOSIS — I130 Hypertensive heart and chronic kidney disease with heart failure and stage 1 through stage 4 chronic kidney disease, or unspecified chronic kidney disease: Secondary | ICD-10-CM | POA: Diagnosis not present

## 2017-07-30 DIAGNOSIS — S42201D Unspecified fracture of upper end of right humerus, subsequent encounter for fracture with routine healing: Secondary | ICD-10-CM | POA: Diagnosis not present

## 2017-07-31 ENCOUNTER — Telehealth: Payer: Self-pay | Admitting: Endocrinology

## 2017-07-31 ENCOUNTER — Other Ambulatory Visit: Payer: Self-pay

## 2017-07-31 MED ORDER — PREGABALIN 50 MG PO CAPS: 50.0000 mg | ORAL_CAPSULE | Freq: Three times a day (TID) | ORAL | 1 refills | Status: DC

## 2017-07-31 MED ORDER — GLUCOSE BLOOD VI STRP: ORAL_STRIP | 1 refills | Status: DC

## 2017-07-31 NOTE — Telephone Encounter (Signed)
Called patient and left a voice message to let her know that these prescriptions have been sent over to [facility].

## 2017-07-31 NOTE — Telephone Encounter (Signed)
Patient need a new prescription for Lyrica and test strips, send to [facility]

## 2017-07-31 NOTE — Telephone Encounter (Signed)
I have sent the strips to [facility] and I have printed out the Lyrica prescription and placed on Dr. Jodelle Green desk to be signed. Once I receive I will call patient to let her know that I have faxed this prescription.

## 2017-08-05 ENCOUNTER — Encounter: Payer: Self-pay | Admitting: Neurology

## 2017-08-05 ENCOUNTER — Encounter (INDEPENDENT_AMBULATORY_CARE_PROVIDER_SITE_OTHER): Payer: Self-pay

## 2017-08-05 ENCOUNTER — Ambulatory Visit (INDEPENDENT_AMBULATORY_CARE_PROVIDER_SITE_OTHER): Payer: Medicare Other | Admitting: Neurology

## 2017-08-05 VITALS — BP 116/65 | HR 99 | Ht 62.0 in | Wt 153.5 lb

## 2017-08-05 DIAGNOSIS — E1140 Type 2 diabetes mellitus with diabetic neuropathy, unspecified: Secondary | ICD-10-CM

## 2017-08-05 DIAGNOSIS — G2581 Restless legs syndrome: Secondary | ICD-10-CM | POA: Diagnosis not present

## 2017-08-05 DIAGNOSIS — I63113 Cerebral infarction due to embolism of bilateral vertebral arteries: Secondary | ICD-10-CM | POA: Diagnosis not present

## 2017-08-05 DIAGNOSIS — Z794 Long term (current) use of insulin: Secondary | ICD-10-CM

## 2017-08-05 DIAGNOSIS — E538 Deficiency of other specified B group vitamins: Secondary | ICD-10-CM | POA: Diagnosis not present

## 2017-08-05 HISTORY — DX: Restless legs syndrome: G25.81

## 2017-08-05 MED ORDER — CYANOCOBALAMIN 1000 MCG/ML IJ SOLN
1000.0000 ug | Freq: Once | INTRAMUSCULAR | Status: AC
  Administered 2017-08-05: 1000 ug via INTRAMUSCULAR

## 2017-08-05 MED ORDER — PRAMIPEXOLE DIHYDROCHLORIDE 0.5 MG PO TABS: 0.5000 mg | ORAL_TABLET | Freq: Every day | ORAL | 3 refills | Status: DC
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
## 2017-08-05 NOTE — Progress Notes (Signed)
Gave Vitamin B12 1032mcg IM in left deltoid. Cleaned with alcohol wipe prior to injection. Band-aid applied. Pt tolerated well.

## 2017-08-22 ENCOUNTER — Other Ambulatory Visit: Payer: Medicare Other

## 2017-08-25 ENCOUNTER — Other Ambulatory Visit (INDEPENDENT_AMBULATORY_CARE_PROVIDER_SITE_OTHER): Payer: Medicare Other

## 2017-08-25 DIAGNOSIS — E1165 Type 2 diabetes mellitus with hyperglycemia: Secondary | ICD-10-CM

## 2017-08-25 DIAGNOSIS — Z794 Long term (current) use of insulin: Secondary | ICD-10-CM

## 2017-08-25 LAB — BASIC METABOLIC PANEL
BUN: 25 mg/dL — AB (ref 6–23)
CALCIUM: 9.5 mg/dL (ref 8.4–10.5)
CO2: 31 meq/L (ref 19–32)
CREATININE: 1.22 mg/dL — AB (ref 0.40–1.20)
Chloride: 101 mEq/L (ref 96–112)
GFR: 44.27 mL/min — AB (ref >60.00)
GLUCOSE: 103 mg/dL — AB (ref 70–99)
Potassium: 4.9 mEq/L (ref 3.5–5.1)
SODIUM: 138 meq/L (ref 135–145)

## 2017-08-25 LAB — HEMOGLOBIN A1C: Hgb A1c MFr Bld: 6.2 % (ref 4.6–6.5)

## 2017-08-28 ENCOUNTER — Encounter: Payer: Self-pay | Admitting: Endocrinology

## 2017-08-28 ENCOUNTER — Ambulatory Visit (INDEPENDENT_AMBULATORY_CARE_PROVIDER_SITE_OTHER): Payer: Medicare Other | Admitting: Endocrinology

## 2017-08-28 VITALS — BP 130/74 | HR 109 | Ht 62.0 in | Wt 152.8 lb

## 2017-08-28 DIAGNOSIS — E1165 Type 2 diabetes mellitus with hyperglycemia: Secondary | ICD-10-CM | POA: Diagnosis not present

## 2017-08-28 DIAGNOSIS — Z794 Long term (current) use of insulin: Secondary | ICD-10-CM | POA: Diagnosis not present

## 2017-08-28 DIAGNOSIS — E063 Autoimmune thyroiditis: Secondary | ICD-10-CM | POA: Diagnosis not present

## 2017-08-28 DIAGNOSIS — R109 Unspecified abdominal pain: Secondary | ICD-10-CM | POA: Diagnosis not present

## 2017-08-28 DIAGNOSIS — I63113 Cerebral infarction due to embolism of bilateral vertebral arteries: Secondary | ICD-10-CM | POA: Diagnosis not present

## 2017-08-28 NOTE — Patient Instructions (Addendum)
Take 3 Novolog 3 at meals unless eating large meal or sweet  Levemir 20 units  Check blood sugars on waking up  3/7 days  Also check blood sugars about 2 hours after a meal and do this after different meals by rotation  Recommended blood sugar levels on waking up is 90-130 and about 2 hours after meal is 130-180  Please bring your blood sugar monitor to each visit, thank you
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
## 2017-09-08 ENCOUNTER — Ambulatory Visit (INDEPENDENT_AMBULATORY_CARE_PROVIDER_SITE_OTHER): Payer: Medicare Other | Admitting: *Deleted

## 2017-09-08 DIAGNOSIS — E538 Deficiency of other specified B group vitamins: Secondary | ICD-10-CM

## 2017-09-08 MED ORDER — CYANOCOBALAMIN 1000 MCG/ML IJ SOLN
1000.0000 ug | Freq: Once | INTRAMUSCULAR | Status: AC
  Administered 2017-09-08: 1000 ug via INTRAMUSCULAR

## 2017-09-08 NOTE — Progress Notes (Signed)
Gave Vitamin B12 1038mcg IM in left deltoid. Cleaned with alcohol wipe prior to injection. Band-aid applied. Pt tolerated well.

## 2017-09-25 DIAGNOSIS — S42221D 2-part displaced fracture of surgical neck of right humerus, subsequent encounter for fracture with routine healing: Secondary | ICD-10-CM | POA: Diagnosis not present

## 2017-09-26 DIAGNOSIS — H26492 Other secondary cataract, left eye: Secondary | ICD-10-CM | POA: Diagnosis not present

## 2017-09-26 DIAGNOSIS — H26491 Other secondary cataract, right eye: Secondary | ICD-10-CM | POA: Diagnosis not present

## 2017-09-26 DIAGNOSIS — H353130 Nonexudative age-related macular degeneration, bilateral, stage unspecified: Secondary | ICD-10-CM | POA: Diagnosis not present

## 2017-09-26 DIAGNOSIS — H18413 Arcus senilis, bilateral: Secondary | ICD-10-CM | POA: Diagnosis not present

## 2017-09-26 DIAGNOSIS — Z961 Presence of intraocular lens: Secondary | ICD-10-CM | POA: Diagnosis not present

## 2017-10-09 ENCOUNTER — Ambulatory Visit (INDEPENDENT_AMBULATORY_CARE_PROVIDER_SITE_OTHER): Payer: Medicare Other | Admitting: *Deleted

## 2017-10-09 DIAGNOSIS — E538 Deficiency of other specified B group vitamins: Secondary | ICD-10-CM

## 2017-10-09 MED ORDER — CYANOCOBALAMIN 1000 MCG/ML IJ SOLN
1000.0000 ug | Freq: Once | INTRAMUSCULAR | Status: AC
  Administered 2017-10-09: 1000 ug via INTRAMUSCULAR

## 2017-10-20 ENCOUNTER — Telehealth: Payer: Self-pay | Admitting: Neurology

## 2017-10-20 DIAGNOSIS — N390 Urinary tract infection, site not specified: Secondary | ICD-10-CM | POA: Diagnosis not present

## 2017-10-20 MED ORDER — PRAMIPEXOLE DIHYDROCHLORIDE 0.75 MG PO TABS
0.7500 mg | ORAL_TABLET | Freq: Every day | ORAL | 1 refills | Status: DC
Start: 2017-10-20 — End: 2018-08-06

## 2017-10-20 NOTE — Telephone Encounter (Signed)
I called the granddaughter.  The patient is having tingling in the legs, sensation of crawling in the legs.  The Mirapex helped this initially, we will go up on the medication to 0.75 mg at night.  If this is not helpful, the Lyrica can be increased to 50 mg twice during the day and 100 mg at night.

## 2017-10-20 NOTE — Addendum Note (Signed)
Addended by: Kathrynn Ducking on: 10/20/2017 02:58 PM   Modules accepted: Orders

## 2017-10-20 NOTE — Telephone Encounter (Signed)
I spoke with Melissa [name] and she states that her grandmother is having the tingling and pain in her legs again and is not sleeping very well. She says that she's been taking the mirapex as prescribed but was told to call if her symptoms started to worsen again. I confirmed pharmacies, local and mail order. Please advise.

## 2017-10-20 NOTE — Telephone Encounter (Signed)
Pts granddaughter [name] called to inform us that pt is having tingling in her legs and feet so bad that pt isn't sleeping. Please call to discuss

## 2017-10-21 DIAGNOSIS — N3000 Acute cystitis without hematuria: Secondary | ICD-10-CM | POA: Diagnosis not present

## 2017-10-21 DIAGNOSIS — G2581 Restless legs syndrome: Secondary | ICD-10-CM | POA: Diagnosis not present

## 2017-10-24 DIAGNOSIS — H26492 Other secondary cataract, left eye: Secondary | ICD-10-CM | POA: Diagnosis not present

## 2017-11-03 ENCOUNTER — Ambulatory Visit (INDEPENDENT_AMBULATORY_CARE_PROVIDER_SITE_OTHER): Payer: Medicare Other | Admitting: *Deleted

## 2017-11-03 DIAGNOSIS — E538 Deficiency of other specified B group vitamins: Secondary | ICD-10-CM | POA: Diagnosis not present

## 2017-11-03 MED ORDER — CYANOCOBALAMIN 1000 MCG/ML IJ SOLN
1000.0000 ug | Freq: Once | INTRAMUSCULAR | Status: AC
  Administered 2017-11-03: 1000 ug via INTRAMUSCULAR

## 2017-11-03 NOTE — Progress Notes (Signed)
Gave Vitamin B12 10084mcg/ml IM in left deltoid. Cleaned with alcohol wipe prior to injection. Band-aid applied. Pt tolerated well.

## 2017-11-10 ENCOUNTER — Ambulatory Visit: Payer: Medicare Other

## 2017-11-10 DIAGNOSIS — E119 Type 2 diabetes mellitus without complications: Secondary | ICD-10-CM | POA: Diagnosis not present

## 2017-11-13 DIAGNOSIS — H353132 Nonexudative age-related macular degeneration, bilateral, intermediate dry stage: Secondary | ICD-10-CM | POA: Diagnosis not present

## 2017-11-18 ENCOUNTER — Other Ambulatory Visit: Payer: Self-pay | Admitting: Cardiology

## 2017-11-25 ENCOUNTER — Other Ambulatory Visit: Payer: Self-pay | Admitting: Endocrinology

## 2017-12-01 ENCOUNTER — Other Ambulatory Visit (INDEPENDENT_AMBULATORY_CARE_PROVIDER_SITE_OTHER): Payer: Medicare Other

## 2017-12-01 DIAGNOSIS — Z794 Long term (current) use of insulin: Secondary | ICD-10-CM | POA: Diagnosis not present

## 2017-12-01 DIAGNOSIS — E1165 Type 2 diabetes mellitus with hyperglycemia: Secondary | ICD-10-CM

## 2017-12-01 LAB — BASIC METABOLIC PANEL
BUN: 22 mg/dL (ref 6–23)
CALCIUM: 9.1 mg/dL (ref 8.4–10.5)
CO2: 32 mEq/L (ref 19–32)
CREATININE: 1.4 mg/dL — AB (ref 0.40–1.20)
Chloride: 101 mEq/L (ref 96–112)
GFR: 37.75 mL/min — ABNORMAL LOW (ref >60.00)
Glucose, Bld: 184 mg/dL — ABNORMAL HIGH (ref 70–99)
Potassium: 5.2 mEq/L — ABNORMAL HIGH (ref 3.5–5.1)
Sodium: 138 mEq/L (ref 135–145)

## 2017-12-01 LAB — HEMOGLOBIN A1C: Hgb A1c MFr Bld: 6.2 % (ref 4.6–6.5)

## 2017-12-01 LAB — TSH: TSH: 2.99 u[IU]/mL (ref 0.35–4.50)

## 2017-12-01 LAB — T4, FREE: FREE T4: 0.9 ng/dL (ref 0.60–1.60)

## 2017-12-03 ENCOUNTER — Encounter (INDEPENDENT_AMBULATORY_CARE_PROVIDER_SITE_OTHER): Payer: Self-pay

## 2017-12-03 ENCOUNTER — Ambulatory Visit (INDEPENDENT_AMBULATORY_CARE_PROVIDER_SITE_OTHER): Payer: Medicare Other

## 2017-12-03 DIAGNOSIS — E538 Deficiency of other specified B group vitamins: Secondary | ICD-10-CM

## 2017-12-03 MED ORDER — CYANOCOBALAMIN 1000 MCG/ML IJ SOLN
1000.0000 ug | Freq: Once | INTRAMUSCULAR | Status: AC
  Administered 2017-12-03: 1000 ug via INTRAMUSCULAR

## 2017-12-04 ENCOUNTER — Encounter: Payer: Self-pay | Admitting: Endocrinology

## 2017-12-04 ENCOUNTER — Ambulatory Visit (INDEPENDENT_AMBULATORY_CARE_PROVIDER_SITE_OTHER): Payer: Medicare Other | Admitting: Endocrinology

## 2017-12-04 VITALS — BP 130/80 | HR 87 | Ht 62.0 in | Wt 153.6 lb

## 2017-12-04 DIAGNOSIS — E1165 Type 2 diabetes mellitus with hyperglycemia: Secondary | ICD-10-CM | POA: Diagnosis not present

## 2017-12-04 DIAGNOSIS — E1142 Type 2 diabetes mellitus with diabetic polyneuropathy: Secondary | ICD-10-CM | POA: Diagnosis not present

## 2017-12-04 DIAGNOSIS — E875 Hyperkalemia: Secondary | ICD-10-CM | POA: Diagnosis not present

## 2017-12-04 DIAGNOSIS — N289 Disorder of kidney and ureter, unspecified: Secondary | ICD-10-CM

## 2017-12-04 DIAGNOSIS — Z794 Long term (current) use of insulin: Secondary | ICD-10-CM
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
## 2017-12-04 NOTE — Patient Instructions (Addendum)
No Alleve and ask Dr [name] for Rx  Reduce bananas  NO NOVOLOG AT BEDTIME ' ONLY 3 UniTs NOVOLOG WITH MEALS

## 2017-12-09 ENCOUNTER — Encounter: Payer: Self-pay | Admitting: Cardiology

## 2017-12-09 ENCOUNTER — Ambulatory Visit (INDEPENDENT_AMBULATORY_CARE_PROVIDER_SITE_OTHER): Payer: Medicare Other | Admitting: Cardiology

## 2017-12-09 VITALS — BP 130/68 | HR 95 | Ht 65.0 in | Wt 152.2 lb

## 2017-12-09 DIAGNOSIS — I5022 Chronic systolic (congestive) heart failure: Secondary | ICD-10-CM

## 2017-12-09 DIAGNOSIS — I480 Paroxysmal atrial fibrillation: Secondary | ICD-10-CM

## 2017-12-09 DIAGNOSIS — I10 Essential (primary) hypertension: Secondary | ICD-10-CM | POA: Diagnosis not present

## 2017-12-09 DIAGNOSIS — I420 Dilated cardiomyopathy: Secondary | ICD-10-CM | POA: Diagnosis not present

## 2017-12-09 DIAGNOSIS — E119 Type 2 diabetes mellitus without complications: Secondary | ICD-10-CM

## 2017-12-09 DIAGNOSIS — Z7901 Long term (current) use of anticoagulants: Secondary | ICD-10-CM

## 2017-12-09 NOTE — Patient Instructions (Signed)
Medication Instructions:  The current medical regimen is effective;  continue present plan and medications.  Follow-Up: Follow up in 6 months with Lori Gerhardt, NP.  You will receive a letter in the mail 2 months before you are due.  Please call us when you receive this letter to schedule your follow up appointment.  Follow up in 1 year with Dr. Skains.  You will receive a letter in the mail 2 months before you are due.  Please call us when you receive this letter to schedule your follow up appointment.  If you need a refill on your cardiac medications before your next appointment, please call your pharmacy.  Thank you for choosing [facility]!!     
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
## 2017-12-21 ENCOUNTER — Other Ambulatory Visit: Payer: Self-pay | Admitting: Endocrinology

## 2018-01-01 ENCOUNTER — Ambulatory Visit (INDEPENDENT_AMBULATORY_CARE_PROVIDER_SITE_OTHER): Payer: Medicare Other

## 2018-01-01 DIAGNOSIS — E538 Deficiency of other specified B group vitamins: Secondary | ICD-10-CM

## 2018-01-01 MED ORDER — CYANOCOBALAMIN 1000 MCG/ML IJ SOLN
1000.0000 ug | Freq: Once | INTRAMUSCULAR | Status: AC
  Administered 2018-01-01: 1000 ug via INTRAMUSCULAR

## 2018-01-14 DIAGNOSIS — E119 Type 2 diabetes mellitus without complications: Secondary | ICD-10-CM | POA: Diagnosis not present

## 2018-01-19 ENCOUNTER — Other Ambulatory Visit: Payer: Self-pay

## 2018-01-19 DIAGNOSIS — Z794 Long term (current) use of insulin: Principal | ICD-10-CM

## 2018-01-19 DIAGNOSIS — E1165 Type 2 diabetes mellitus with hyperglycemia: Secondary | ICD-10-CM

## 2018-01-19 MED ORDER — ONETOUCH DELICA LANCETS 33G MISC
1 refills | Status: DC
Start: 2018-01-19 — End: 2022-08-27

## 2018-01-24 ENCOUNTER — Other Ambulatory Visit: Payer: Self-pay | Admitting: Cardiology

## 2018-01-26 NOTE — Telephone Encounter (Signed)
Eliquis 5mg  refill request received; pt is 84 yrs old, wt-69kg, Crea-1.40 on 12/01/17, last seen by [name] on 12/09/17; will send in refill to requested pharmacy.

## 2018-01-30 DIAGNOSIS — J189 Pneumonia, unspecified organism: Secondary | ICD-10-CM | POA: Diagnosis not present

## 2018-01-30 DIAGNOSIS — R0602 Shortness of breath: Secondary | ICD-10-CM | POA: Diagnosis not present

## 2018-01-30 DIAGNOSIS — R05 Cough: Secondary | ICD-10-CM | POA: Diagnosis not present

## 2018-02-01 DIAGNOSIS — J189 Pneumonia, unspecified organism: Secondary | ICD-10-CM | POA: Diagnosis not present

## 2018-02-02 ENCOUNTER — Other Ambulatory Visit: Payer: Self-pay | Admitting: *Deleted

## 2018-02-02 ENCOUNTER — Ambulatory Visit: Payer: Medicare Other

## 2018-02-02 MED ORDER — GLUCOSE BLOOD VI STRP: ORAL_STRIP | 1 refills | Status: DC

## 2018-02-06 DIAGNOSIS — J189 Pneumonia, unspecified organism: Secondary | ICD-10-CM | POA: Diagnosis not present

## 2018-02-16 ENCOUNTER — Other Ambulatory Visit: Payer: Self-pay | Admitting: Endocrinology

## 2018-02-16 MED ORDER — PREGABALIN 50 MG PO CAPS: 50.0000 mg | ORAL_CAPSULE | Freq: Three times a day (TID) | ORAL | 0 refills | Status: DC

## 2018-02-27 ENCOUNTER — Ambulatory Visit
Admission: RE | Admit: 2018-02-27 | Discharge: 2018-02-27 | Disposition: A | Discharge status: Home / Self Care | Payer: Medicare Other | Source: Ambulatory Visit | Attending: Family Medicine | Admitting: Family Medicine

## 2018-02-27 ENCOUNTER — Other Ambulatory Visit: Payer: Self-pay | Admitting: Family Medicine

## 2018-02-27 DIAGNOSIS — J690 Pneumonitis due to inhalation of food and vomit: Secondary | ICD-10-CM

## 2018-02-27 DIAGNOSIS — J189 Pneumonia, unspecified organism: Secondary | ICD-10-CM | POA: Diagnosis not present

## 2018-03-03 ENCOUNTER — Other Ambulatory Visit: Payer: Medicare Other

## 2018-03-05 ENCOUNTER — Ambulatory Visit: Payer: Medicare Other | Admitting: Endocrinology

## 2018-03-18 ENCOUNTER — Other Ambulatory Visit: Payer: Self-pay | Admitting: Family Medicine

## 2018-03-18 DIAGNOSIS — J189 Pneumonia, unspecified organism: Secondary | ICD-10-CM

## 2018-03-22 ENCOUNTER — Other Ambulatory Visit: Payer: Self-pay | Admitting: Neurology

## 2018-03-27 ENCOUNTER — Ambulatory Visit
Admission: RE | Admit: 2018-03-27 | Discharge: 2018-03-27 | Disposition: A | Discharge status: Home / Self Care | Payer: Medicare Other | Source: Ambulatory Visit | Attending: Family Medicine | Admitting: Family Medicine

## 2018-03-27 DIAGNOSIS — J189 Pneumonia, unspecified organism: Secondary | ICD-10-CM | POA: Diagnosis not present

## 2018-04-07 ENCOUNTER — Encounter: Payer: Self-pay | Admitting: Pulmonary Disease

## 2018-04-07 ENCOUNTER — Other Ambulatory Visit (INDEPENDENT_AMBULATORY_CARE_PROVIDER_SITE_OTHER): Payer: Medicare Other

## 2018-04-07 ENCOUNTER — Ambulatory Visit (INDEPENDENT_AMBULATORY_CARE_PROVIDER_SITE_OTHER): Payer: Medicare Other | Admitting: Pulmonary Disease

## 2018-04-07 VITALS — BP 116/78 | HR 94 | Ht 65.0 in | Wt 154.7 lb

## 2018-04-07 DIAGNOSIS — J4541 Moderate persistent asthma with (acute) exacerbation: Secondary | ICD-10-CM | POA: Diagnosis not present

## 2018-04-07 DIAGNOSIS — R9389 Abnormal findings on diagnostic imaging of other specified body structures: Secondary | ICD-10-CM

## 2018-04-07 DIAGNOSIS — R0602 Shortness of breath: Secondary | ICD-10-CM | POA: Diagnosis not present

## 2018-04-07 LAB — CBC WITH DIFFERENTIAL/PLATELET
Basophils Absolute: 0.1 10*3/uL (ref 0.0–0.1)
Basophils Relative: 1 % (ref 0.0–3.0)
EOS PCT: 2.2 % (ref 0.0–5.0)
Eosinophils Absolute: 0.1 10*3/uL (ref 0.0–0.7)
HEMATOCRIT: 41.1 % (ref 36.0–46.0)
HEMOGLOBIN: 13.6 g/dL (ref 12.0–15.0)
Lymphocytes Relative: 27.2 % (ref 12.0–46.0)
Lymphs Abs: 1.7 10*3/uL (ref 0.7–4.0)
MCHC: 33 g/dL (ref 30.0–36.0)
MCV: 87.4 fl (ref 78.0–100.0)
MONO ABS: 0.5 10*3/uL (ref 0.1–1.0)
MONOS PCT: 8.3 % (ref 3.0–12.0)
Neutro Abs: 3.7 10*3/uL (ref 1.4–7.7)
Neutrophils Relative %: 61.3 % (ref 43.0–77.0)
Platelets: 208 10*3/uL (ref 150.0–400.0)
RBC: 4.7 Mil/uL (ref 3.87–5.11)
RDW: 13.8 % (ref 11.5–15.5)
WBC: 6.1 10*3/uL (ref 4.0–10.5)

## 2018-04-07 MED ORDER — PREDNISONE 10 MG PO TABS: ORAL_TABLET | ORAL | 0 refills | Status: DC

## 2018-04-07 MED ORDER — FLUTICASONE FUROATE 100 MCG/ACT IN AEPB: 1.0000 | INHALATION_SPRAY | Freq: Every day | RESPIRATORY_TRACT | 0 refills | Status: DC

## 2018-04-07 NOTE — Patient Instructions (Signed)
Sample of Arnuity inhaler/s 100 once daily,  Rinse mouth after use  call us for prescription of this works  Prednisone 10 mg tabs  Take 2 tabs daily with food x 5ds, then 1 tab daily with food until seen again # 60   RAST panel today CBC + diff  Obtain sputum culture if you have productive sputum  We will repeat CT scan in the future
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
## 2018-04-07 NOTE — Assessment & Plan Note (Signed)
Will use inhaled steroids and give a short treatment trial with low-dose prednisone to see if this improves her chronic cough  Sample of Arnuity inhaler/s 100 once daily,  Rinse mouth after use  call us for prescription of this works  Prednisone 10 mg tabs  Take 2 tabs daily with food x 5ds, then 1 tab daily with food until seen again # 60    Obtain sputum culture if you have productive sputum

## 2018-04-07 NOTE — Assessment & Plan Note (Addendum)
Bilateral groundglass infiltrates could simply indicate resolving pneumonia. What is not clear is if this is an inflammatory type pneumonia.  Mild bronchiectasis and mucoid impaction noted-at the same time this is not typical for MAC.  The central bronchiectasis is not impressive, I would be hesitant to make a diagnosis of ABPA in this 84 year old. She does carry a lifelong diagnosis of asthma  Will obtain RAST panel and CBC with differential to check for eosinophilia Would defer invasive testing such as bronchoscopy as a last resort. She will need follow-up CT scan in 3 months as a high-resolution.

## 2018-04-08 LAB — RESPIRATORY ALLERGY PROFILE REGION II ~~LOC~~
Allergen, Cedar tree, t12: <0.1 kU/L
Allergen, D pternoyssinus,d7: <0.1 kU/L
Allergen, Mouse Urine Protein, e78: <0.1 kU/L
Allergen, Mulberry, t76: <0.1 kU/L
Allergen, Oak,t7: <0.1 kU/L
Aspergillus fumigatus, m3: <0.1 kU/L
Bermuda Grass: <0.1 kU/L
Box Elder IgE: <0.1 kU/L
CLADOSPORIUM HERBARUM (M2) IGE: <0.1 kU/L
CLASS: 0
CLASS: 0
CLASS: 0
CLASS: 0
CLASS: 0
CLASS: 0
CLASS: 0
CLASS: 0
CLASS: 0
CLASS: 0
COMMON RAGWEED (SHORT) (W1) IGE: <0.1 kU/L
Class: 0
Class: 0
Class: 0
Class: 0
Class: 0
Class: 0
Class: 0
Class: 0
Class: 0
Class: 0
Class: 0
Class: 0
Class: 0
Class: 0
Cockroach: <0.1 kU/L
D. farinae: <0.1 kU/L
Elm IgE: <0.1 kU/L
IGE (IMMUNOGLOBULIN E), SERUM: 21 kU/L (ref <=114)
Rough Pigweed  IgE: <0.1 kU/L
Sheep Sorrel IgE: <0.1 kU/L
Timothy Grass: <0.1 kU/L

## 2018-04-08 LAB — INTERPRETATION:

## 2018-04-17 ENCOUNTER — Telehealth: Payer: Self-pay | Admitting: Pulmonary Disease

## 2018-04-17 DIAGNOSIS — F322 Major depressive disorder, single episode, severe without psychotic features: Secondary | ICD-10-CM | POA: Diagnosis not present

## 2018-04-17 DIAGNOSIS — E785 Hyperlipidemia, unspecified: Secondary | ICD-10-CM | POA: Diagnosis not present

## 2018-04-17 DIAGNOSIS — Z23 Encounter for immunization: Secondary | ICD-10-CM | POA: Diagnosis not present

## 2018-04-17 DIAGNOSIS — N390 Urinary tract infection, site not specified: Secondary | ICD-10-CM | POA: Diagnosis not present

## 2018-04-17 DIAGNOSIS — E039 Hypothyroidism, unspecified: Secondary | ICD-10-CM | POA: Diagnosis not present

## 2018-04-17 DIAGNOSIS — G2581 Restless legs syndrome: Secondary | ICD-10-CM | POA: Diagnosis not present

## 2018-04-17 DIAGNOSIS — Z79899 Other long term (current) drug therapy: Secondary | ICD-10-CM | POA: Diagnosis not present

## 2018-04-17 DIAGNOSIS — Z0000 Encounter for general adult medical examination without abnormal findings: Secondary | ICD-10-CM | POA: Diagnosis not present

## 2018-04-17 DIAGNOSIS — N183 Chronic kidney disease, stage 3 (moderate): Secondary | ICD-10-CM | POA: Diagnosis not present

## 2018-04-17 MED ORDER — FLUTICASONE FUROATE 100 MCG/ACT IN AEPB: 1.0000 | INHALATION_SPRAY | Freq: Every day | RESPIRATORY_TRACT | 0 refills | Status: AC

## 2018-04-17 MED ORDER — FLUTICASONE FUROATE 100 MCG/ACT IN AEPB: 1.0000 | INHALATION_SPRAY | Freq: Every day | RESPIRATORY_TRACT | 0 refills | Status: DC

## 2018-04-17 NOTE — Telephone Encounter (Signed)
Called and spoke to pt's granddaughter, [name] (DPR). [name] requested Rx for Arnuity 100 to be sent to [facility], as pt feels this medication is effective.  One sample of Arnuity 100 has been placed up front for pick up to get pt by until med is delivered.  Nothing further is needed.

## 2018-04-20 ENCOUNTER — Other Ambulatory Visit (INDEPENDENT_AMBULATORY_CARE_PROVIDER_SITE_OTHER): Payer: Medicare Other

## 2018-04-20 DIAGNOSIS — E1165 Type 2 diabetes mellitus with hyperglycemia: Secondary | ICD-10-CM

## 2018-04-20 DIAGNOSIS — Z794 Long term (current) use of insulin: Secondary | ICD-10-CM | POA: Diagnosis not present

## 2018-04-20 LAB — COMPREHENSIVE METABOLIC PANEL
ALBUMIN: 3.8 g/dL (ref 3.5–5.2)
ALT: 16 U/L (ref 0–35)
AST: 18 U/L (ref 0–37)
Alkaline Phosphatase: 40 U/L (ref 39–117)
BUN: 20 mg/dL (ref 6–23)
CO2: 32 mEq/L (ref 19–32)
CREATININE: 1.27 mg/dL — AB (ref 0.40–1.20)
Calcium: 9 mg/dL (ref 8.4–10.5)
Chloride: 101 mEq/L (ref 96–112)
GFR: 42.2 mL/min — ABNORMAL LOW (ref >60.00)
Glucose, Bld: 136 mg/dL — ABNORMAL HIGH (ref 70–99)
Potassium: 4.8 mEq/L (ref 3.5–5.1)
SODIUM: 138 meq/L (ref 135–145)
TOTAL PROTEIN: 6.5 g/dL (ref 6.0–8.3)
Total Bilirubin: 0.3 mg/dL (ref 0.2–1.2)

## 2018-04-20 LAB — LDL CHOLESTEROL, DIRECT: Direct LDL: 54 mg/dL

## 2018-04-20 LAB — HEMOGLOBIN A1C: Hgb A1c MFr Bld: 6.8 % — ABNORMAL HIGH (ref 4.6–6.5)

## 2018-04-20 LAB — LIPID PANEL
Cholesterol: 137 mg/dL (ref 0–200)
HDL: 42.9 mg/dL (ref >39.00)
NonHDL: 94.57
Total CHOL/HDL Ratio: 3
Triglycerides: 206 mg/dL — ABNORMAL HIGH (ref 0.0–149.0)
VLDL: 41.2 mg/dL — ABNORMAL HIGH (ref 0.0–40.0)

## 2018-04-20 LAB — MICROALBUMIN / CREATININE URINE RATIO
CREATININE, U: 105.5 mg/dL
MICROALB/CREAT RATIO: 0.7 mg/g (ref 0.0–30.0)
Microalb, Ur: <0.7 mg/dL (ref 0.0–1.9)

## 2018-04-22 ENCOUNTER — Encounter: Payer: Self-pay | Admitting: Endocrinology

## 2018-04-22 ENCOUNTER — Ambulatory Visit (INDEPENDENT_AMBULATORY_CARE_PROVIDER_SITE_OTHER): Payer: Medicare Other | Admitting: Endocrinology

## 2018-04-22 VITALS — BP 110/50 | HR 89 | Ht 65.0 in | Wt 154.6 lb

## 2018-04-22 DIAGNOSIS — Z794 Long term (current) use of insulin: Secondary | ICD-10-CM | POA: Diagnosis not present

## 2018-04-22 DIAGNOSIS — E1142 Type 2 diabetes mellitus with diabetic polyneuropathy: Secondary | ICD-10-CM | POA: Diagnosis not present

## 2018-04-22 DIAGNOSIS — E782 Mixed hyperlipidemia: Secondary | ICD-10-CM

## 2018-04-22 DIAGNOSIS — N289 Disorder of kidney and ureter, unspecified: Secondary | ICD-10-CM

## 2018-04-22 DIAGNOSIS — E1165 Type 2 diabetes mellitus with hyperglycemia: Secondary | ICD-10-CM

## 2018-04-22 NOTE — Patient Instructions (Addendum)
Check blood sugars on waking up  4/7  Also check blood sugars about 2 hours after a meal and do this after different meals by rotation  Recommended blood sugar levels on waking up is 90-130 and about 2 hours after meal is 130-180  Please bring your blood sugar monitor to each visit, thank you
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
## 2018-04-27 ENCOUNTER — Other Ambulatory Visit: Payer: Self-pay | Admitting: *Deleted

## 2018-04-27 DIAGNOSIS — I480 Paroxysmal atrial fibrillation: Secondary | ICD-10-CM

## 2018-04-27 MED ORDER — METOPROLOL SUCCINATE ER 25 MG PO TB24: ORAL_TABLET | ORAL | 1 refills | Status: DC

## 2018-04-27 MED ORDER — METOPROLOL SUCCINATE ER 25 MG PO TB24
ORAL_TABLET | ORAL | 0 refills | Status: DC
Start: 2018-04-27 — End: 2018-04-27

## 2018-05-07 ENCOUNTER — Telehealth: Payer: Self-pay | Admitting: Emergency Medicine

## 2018-05-07 ENCOUNTER — Ambulatory Visit (INDEPENDENT_AMBULATORY_CARE_PROVIDER_SITE_OTHER): Payer: Medicare Other | Admitting: Pulmonary Disease

## 2018-05-07 ENCOUNTER — Other Ambulatory Visit: Payer: Self-pay

## 2018-05-07 ENCOUNTER — Encounter: Payer: Self-pay | Admitting: Pulmonary Disease

## 2018-05-07 DIAGNOSIS — J471 Bronchiectasis with (acute) exacerbation: Secondary | ICD-10-CM

## 2018-05-07 DIAGNOSIS — R9389 Abnormal findings on diagnostic imaging of other specified body structures: Secondary | ICD-10-CM | POA: Diagnosis not present

## 2018-05-07 DIAGNOSIS — J4531 Mild persistent asthma with (acute) exacerbation: Secondary | ICD-10-CM

## 2018-05-07 MED ORDER — LIRAGLUTIDE 18 MG/3ML ~~LOC~~ SOPN: PEN_INJECTOR | SUBCUTANEOUS | 2 refills | Status: DC

## 2018-05-07 NOTE — Patient Instructions (Signed)
Decrease prednisone to 1 tablet daily until mid August, then can decrease to every Monday/Wednesday/Friday until done  Continue on fluticasone inhaler once daily  Swallow testing with therapist present.  High-resolution CT chest without contrast towards end September

## 2018-05-07 NOTE — Telephone Encounter (Signed)
Called pt and her granddaughter who typically handles her medications stated that it was not lipitor that she needed refilled, it was lyrica. Pt would like the Lyrica sent to [facility].

## 2018-05-07 NOTE — Telephone Encounter (Signed)
Pt called and wants to know if she can get a 30 day refill on her prescriptions VICTOZA 18 MG/3ML SOPN and atorvastatin (LIPITOR) 40 MG tablet. [facility]. Thanks.

## 2018-05-07 NOTE — Assessment & Plan Note (Signed)
Decrease prednisone to 1 tablet daily until mid August, then can decrease to every Monday/Wednesday/Friday until done  Continue on fluticasone inhaler once daily

## 2018-05-07 NOTE — Telephone Encounter (Signed)
Rx for Victoza sent to pharmacy.  You are not the prescriber of Lipitor. Would you like to refill it?

## 2018-05-07 NOTE — Assessment & Plan Note (Addendum)
Swallow testing with therapist present.  High-resolution CT chest without contrast towards end September  Benzonatate 200 twice daily as needed for cough
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
## 2018-05-07 NOTE — Telephone Encounter (Signed)
She will need to get Lipitor prescribed by the previous physician on record

## 2018-05-08 ENCOUNTER — Other Ambulatory Visit: Payer: Self-pay | Admitting: Endocrinology

## 2018-05-08 ENCOUNTER — Telehealth: Payer: Self-pay | Admitting: Pulmonary Disease

## 2018-05-08 ENCOUNTER — Other Ambulatory Visit (HOSPITAL_COMMUNITY): Payer: Self-pay | Admitting: Pulmonary Disease

## 2018-05-08 DIAGNOSIS — R1310 Dysphagia, unspecified: Secondary | ICD-10-CM

## 2018-05-08 MED ORDER — PREGABALIN 50 MG PO CAPS: 50.0000 mg | ORAL_CAPSULE | Freq: Three times a day (TID) | ORAL | 0 refills | Status: DC

## 2018-05-08 MED ORDER — BENZONATATE 200 MG PO CAPS: 200.0000 mg | ORAL_CAPSULE | Freq: Three times a day (TID) | ORAL | 0 refills | Status: DC | PRN

## 2018-05-08 NOTE — Telephone Encounter (Signed)
Pt granddaughter is calling back about the Rx for Tessalon. Per Granddaughter, pt is leaving to go out of town tomorrow and would like to pick these up before then. Pharm [facility] is [phone]

## 2018-05-08 NOTE — Telephone Encounter (Signed)
RA--  Pts granddaughter is calling stating that the tessalon was not sent to the pharmacy yesterday.  What is the quantity you would like to dispense for the tessalon 200 mg?  thanks

## 2018-05-08 NOTE — Telephone Encounter (Signed)
Benzonatate 200 mg 3 times daily as needed 1 monthx with 3 refills

## 2018-05-08 NOTE — Telephone Encounter (Signed)
[name]-granddaughter to patient is aware of Rx per RA-requested we send 1 time 90 day supply instead of 1 month with 3 refills. Pt will keep OV in 07/2018 and nothing further needed at this time.    Pharmacy confirmed: [facility].

## 2018-05-19 ENCOUNTER — Other Ambulatory Visit: Payer: Self-pay | Admitting: Cardiology

## 2018-05-19 ENCOUNTER — Ambulatory Visit (HOSPITAL_COMMUNITY)
Admission: RE | Admit: 2018-05-19 | Discharge: 2018-05-19 | Disposition: A | Discharge status: Home / Self Care | Payer: Medicare Other | Source: Ambulatory Visit | Attending: Pulmonary Disease | Admitting: Pulmonary Disease

## 2018-05-19 DIAGNOSIS — R1310 Dysphagia, unspecified: Secondary | ICD-10-CM | POA: Insufficient documentation

## 2018-05-19 DIAGNOSIS — R9389 Abnormal findings on diagnostic imaging of other specified body structures: Secondary | ICD-10-CM

## 2018-05-19 DIAGNOSIS — I480 Paroxysmal atrial fibrillation: Secondary | ICD-10-CM

## 2018-05-19 NOTE — Progress Notes (Signed)
Modified Barium Swallow Progress Note  Patient Details  Name: Melissa [name] MRN: [id number] Date of Birth: 04/04/1928  Today's Date: 05/19/2018  Modified Barium Swallow completed.  Full report located under Chart Review in the Imaging Section.  Brief recommendations include the following:  Clinical Impression  Pt's oropharyngeal swallow is within gross functional limits given consideration for age-related changes in swallowing. Swallow initiation typically occurred at the level of the vallecula across consistencies. She did require a bolus of puree to faciltiate oral transfer of the barium tablet with oral phase otherwise appropriate. Airway protection was good across all trials. Recommend that pt continue regular diet textures and thin liquids. Given that her subjective complaints are more related to solid foods, could consider a possible esophageal etiology - particularly in light of her h/o HH, GERD. Education was provided about keeping foods moist to facilitate comfort, using general aspiration precautions. Will defer additional f/u to MD.   Swallow Evaluation Recommendations   Recommended Consults: Consider esophageal assessment   SLP Diet Recommendations: Regular solids;Thin liquid   Liquid Administration via: Cup;Straw   Medication Administration: Whole meds with liquid(use puree PRN)   Supervision: Patient able to self feed   Compensations: Slow rate;Small sips/bites;Follow solids with liquid   Postural Changes: Seated upright at 90 degrees;Remain semi-upright after after feeds/meals (Comment)   Oral Care Recommendations: Oral care BID        Germain Osgood 05/19/2018,1:58 PM   Germain Osgood, M.A. CCC-SLP [phone]

## 2018-05-28 ENCOUNTER — Other Ambulatory Visit: Payer: Self-pay | Admitting: Pulmonary Disease

## 2018-05-28 NOTE — Telephone Encounter (Signed)
Received electronic request for prednisone 5mg , pt. Last seen 7/25 by Dr. [name]:     Decrease prednisone to 1 tablet daily until mid August, then can decrease to every Monday/Wednesday/Friday until done  Continue on fluticasone inhaler once daily  Swallow testing with therapist present.  High-resolution CT chest without  contrast towards end September  Dr. [name] it is now mid august and pt is requesting refill for prednisone, please advise on use and duration.  Thanks.

## 2018-05-28 NOTE — Telephone Encounter (Signed)
5 mg tablet to take every Monday Wednesday/Friday #15

## 2018-06-09 ENCOUNTER — Ambulatory Visit: Payer: Medicare Other | Admitting: Nurse Practitioner

## 2018-06-24 DIAGNOSIS — R399 Unspecified symptoms and signs involving the genitourinary system: Secondary | ICD-10-CM | POA: Diagnosis not present

## 2018-06-26 ENCOUNTER — Other Ambulatory Visit: Payer: Self-pay | Admitting: Pulmonary Disease

## 2018-06-26 NOTE — Telephone Encounter (Signed)
RA please advise on prednisone refill: per last refill, instructions were to take Pred 1 tab every MWF until done.  Does she need to continue taking this at this time?  Thanks!

## 2018-06-28 DIAGNOSIS — J0100 Acute maxillary sinusitis, unspecified: Secondary | ICD-10-CM | POA: Diagnosis not present

## 2018-06-28 DIAGNOSIS — J069 Acute upper respiratory infection, unspecified: Secondary | ICD-10-CM | POA: Diagnosis not present

## 2018-06-28 DIAGNOSIS — N390 Urinary tract infection, site not specified: Secondary | ICD-10-CM | POA: Diagnosis not present

## 2018-06-29 NOTE — Telephone Encounter (Signed)
Okay to stop taking prednisone

## 2018-07-07 ENCOUNTER — Other Ambulatory Visit: Payer: Self-pay

## 2018-07-07 ENCOUNTER — Inpatient Hospital Stay: Admission: RE | Admit: 2018-07-07 | Payer: Medicare Other | Source: Ambulatory Visit

## 2018-07-07 MED ORDER — FLUTICASONE FUROATE 100 MCG/ACT IN AEPB: 1.0000 | INHALATION_SPRAY | Freq: Every day | RESPIRATORY_TRACT | 0 refills | Status: DC

## 2018-07-08 ENCOUNTER — Encounter: Payer: Self-pay | Admitting: Medical

## 2018-07-08 ENCOUNTER — Ambulatory Visit (INDEPENDENT_AMBULATORY_CARE_PROVIDER_SITE_OTHER): Payer: Medicare Other | Admitting: Medical

## 2018-07-08 ENCOUNTER — Ambulatory Visit (INDEPENDENT_AMBULATORY_CARE_PROVIDER_SITE_OTHER)
Admission: RE | Admit: 2018-07-08 | Discharge: 2018-07-08 | Disposition: A | Discharge status: Home / Self Care | Payer: Medicare Other | Source: Ambulatory Visit | Attending: Pulmonary Disease | Admitting: Pulmonary Disease

## 2018-07-08 VITALS — BP 98/60 | HR 92 | Wt 158.8 lb

## 2018-07-08 DIAGNOSIS — I480 Paroxysmal atrial fibrillation: Secondary | ICD-10-CM

## 2018-07-08 DIAGNOSIS — I5022 Chronic systolic (congestive) heart failure: Secondary | ICD-10-CM | POA: Diagnosis not present

## 2018-07-08 DIAGNOSIS — E785 Hyperlipidemia, unspecified: Secondary | ICD-10-CM

## 2018-07-08 DIAGNOSIS — E118 Type 2 diabetes mellitus with unspecified complications: Secondary | ICD-10-CM

## 2018-07-08 DIAGNOSIS — J471 Bronchiectasis with (acute) exacerbation: Secondary | ICD-10-CM

## 2018-07-08 DIAGNOSIS — I10 Essential (primary) hypertension: Secondary | ICD-10-CM

## 2018-07-08 DIAGNOSIS — J189 Pneumonia, unspecified organism: Secondary | ICD-10-CM | POA: Diagnosis not present
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
## 2018-07-08 NOTE — Patient Instructions (Signed)
We will be checking the following labs today - None   Medication Instructions:    Continue with your current medicines.     Testing/Procedures To Be Arranged:  N/A  Follow-Up:   See [name] in 6 months    Other Special Instructions:  Please monitor your weight a couple times a week an notify the office if you gain more than 5lbs in 1 week. This may be a sign that you are building up fluid and may need extra lasix  Please continue to limit your salt intake to less than 2 grams per day.     If you need a refill on your cardiac medications before your next appointment, please call your pharmacy.   Call the [place] office at [phone] if you have any questions, problems or concerns.

## 2018-07-09 ENCOUNTER — Telehealth: Payer: Self-pay | Admitting: Pulmonary Disease

## 2018-07-09 NOTE — Telephone Encounter (Signed)
Called and spoke with patient she is aware of results and verbalized understanding. Nothing further needed.  

## 2018-07-17 ENCOUNTER — Other Ambulatory Visit: Payer: Medicare Other

## 2018-07-17 ENCOUNTER — Other Ambulatory Visit (INDEPENDENT_AMBULATORY_CARE_PROVIDER_SITE_OTHER): Payer: Medicare Other

## 2018-07-17 DIAGNOSIS — E1165 Type 2 diabetes mellitus with hyperglycemia: Secondary | ICD-10-CM

## 2018-07-17 DIAGNOSIS — Z794 Long term (current) use of insulin: Secondary | ICD-10-CM | POA: Diagnosis not present

## 2018-07-17 LAB — BASIC METABOLIC PANEL
BUN: 19 mg/dL (ref 6–23)
CHLORIDE: 101 meq/L (ref 96–112)
CO2: 33 mEq/L — ABNORMAL HIGH (ref 19–32)
Calcium: 9.6 mg/dL (ref 8.4–10.5)
Creatinine, Ser: 1.37 mg/dL — ABNORMAL HIGH (ref 0.40–1.20)
GFR: 38.65 mL/min — AB (ref >60.00)
Glucose, Bld: 134 mg/dL — ABNORMAL HIGH (ref 70–99)
POTASSIUM: 4.8 meq/L (ref 3.5–5.1)
SODIUM: 139 meq/L (ref 135–145)

## 2018-07-17 LAB — HEMOGLOBIN A1C: HEMOGLOBIN A1C: 6.6 % — AB (ref 4.6–6.5)

## 2018-07-21 ENCOUNTER — Other Ambulatory Visit: Payer: Self-pay | Admitting: Pulmonary Disease

## 2018-07-24 ENCOUNTER — Ambulatory Visit: Payer: Medicare Other | Admitting: Endocrinology

## 2018-07-27 ENCOUNTER — Encounter: Payer: Self-pay | Admitting: Endocrinology

## 2018-07-27 ENCOUNTER — Ambulatory Visit (INDEPENDENT_AMBULATORY_CARE_PROVIDER_SITE_OTHER): Payer: Medicare Other | Admitting: Endocrinology

## 2018-07-27 VITALS — BP 128/64 | HR 90 | Ht 65.0 in | Wt 163.0 lb

## 2018-07-27 DIAGNOSIS — E1165 Type 2 diabetes mellitus with hyperglycemia: Secondary | ICD-10-CM

## 2018-07-27 DIAGNOSIS — Z23 Encounter for immunization: Secondary | ICD-10-CM

## 2018-07-27 DIAGNOSIS — Z794 Long term (current) use of insulin: Secondary | ICD-10-CM | POA: Diagnosis not present

## 2018-07-27 NOTE — Patient Instructions (Addendum)
Check blood sugars on waking up 3 days a week  Also check blood sugars about 2 hours after meals and do this after different meals by rotation  Recommended blood sugar levels on waking up are 90-130 and about 2 hours after meal is 130-160  Please bring your blood sugar monitor to each visit, thank you  No orange pen
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
## 2018-08-06 ENCOUNTER — Ambulatory Visit (INDEPENDENT_AMBULATORY_CARE_PROVIDER_SITE_OTHER): Payer: Medicare Other | Admitting: Neurology

## 2018-08-06 ENCOUNTER — Encounter: Payer: Self-pay | Admitting: Neurology

## 2018-08-06 ENCOUNTER — Other Ambulatory Visit: Payer: Self-pay

## 2018-08-06 VITALS — BP 131/79 | HR 99 | Resp 20 | Ht 65.0 in | Wt 159.0 lb

## 2018-08-06 DIAGNOSIS — E1140 Type 2 diabetes mellitus with diabetic neuropathy, unspecified: Secondary | ICD-10-CM

## 2018-08-06 DIAGNOSIS — G250 Essential tremor: Secondary | ICD-10-CM | POA: Diagnosis not present

## 2018-08-06 DIAGNOSIS — G2581 Restless legs syndrome: Secondary | ICD-10-CM

## 2018-08-06 DIAGNOSIS — Z794 Long term (current) use of insulin: Secondary | ICD-10-CM

## 2018-08-06 MED ORDER — DULOXETINE HCL 30 MG PO CPEP: 30.0000 mg | ORAL_CAPSULE | Freq: Every day | ORAL | 3 refills | Status: DC

## 2018-08-06 NOTE — Patient Instructions (Signed)
Stop the mirapex and we will start Cymbalta 30 mg at night.  Cymbalta (duloxetine) is an antidepressant medication that is commonly used for peripheral neuropathy pain or for fibromyalgia pain. As with any antidepressant medication, worsening depression can be seen. This medication can potentially cause headache, dizziness, sexual dysfunction, or nausea. If any problems are noted on this medication, please contact our office.
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
## 2018-08-13 ENCOUNTER — Other Ambulatory Visit: Payer: Self-pay

## 2018-08-13 MED ORDER — GLUCOSE BLOOD VI STRP: ORAL_STRIP | 1 refills | Status: DC

## 2018-08-18 DIAGNOSIS — E1142 Type 2 diabetes mellitus with diabetic polyneuropathy: Secondary | ICD-10-CM | POA: Diagnosis not present

## 2018-08-18 DIAGNOSIS — Z794 Long term (current) use of insulin: Secondary | ICD-10-CM | POA: Diagnosis not present

## 2018-08-18 DIAGNOSIS — H6121 Impacted cerumen, right ear: Secondary | ICD-10-CM | POA: Diagnosis not present

## 2018-08-27 ENCOUNTER — Telehealth: Payer: Self-pay | Admitting: Neurology

## 2018-08-27 MED ORDER — DULOXETINE HCL 60 MG PO CPEP: 60.0000 mg | ORAL_CAPSULE | Freq: Every day | ORAL | 1 refills | Status: DC

## 2018-08-27 NOTE — Addendum Note (Signed)
Addended by: Kathrynn Ducking on: 08/27/2018 04:55 PM   Modules accepted: Orders

## 2018-08-27 NOTE — Telephone Encounter (Signed)
Pt granddaughter(on DPR-Young,[name]) has called to inform that pt has had to go up to 2 pills at once re: restless leg medication(granddaughter does not recall name of medication) , she is asking for a revised prescription since pt will now be increasing dosage of 2 a day.  Please call into  [id number] - [place], [place] - [address] AT [address]

## 2018-08-27 NOTE — Telephone Encounter (Signed)
I called, the caretaker indicated that the 60 mg of Cymbalta at night does help her rest better at night, we will continue the medication at this dose.  I will send in a prescription for the 60 mg capsules.

## 2018-10-19 DIAGNOSIS — H353114 Nonexudative age-related macular degeneration, right eye, advanced atrophic with subfoveal involvement: Secondary | ICD-10-CM | POA: Diagnosis not present

## 2018-10-19 DIAGNOSIS — H353123 Nonexudative age-related macular degeneration, left eye, advanced atrophic without subfoveal involvement: Secondary | ICD-10-CM | POA: Diagnosis not present

## 2018-10-19 DIAGNOSIS — H43813 Vitreous degeneration, bilateral: Secondary | ICD-10-CM | POA: Diagnosis not present

## 2018-11-04 ENCOUNTER — Other Ambulatory Visit: Payer: Self-pay | Admitting: Cardiology

## 2018-11-04 DIAGNOSIS — I480 Paroxysmal atrial fibrillation: Secondary | ICD-10-CM

## 2018-11-04 MED ORDER — METOPROLOL SUCCINATE ER 25 MG PO TB24: ORAL_TABLET | ORAL | 2 refills | Status: DC

## 2018-11-04 MED ORDER — APIXABAN 5 MG PO TABS: ORAL_TABLET | ORAL | 2 refills | Status: DC

## 2018-11-04 NOTE — Telephone Encounter (Signed)
Eliquis 5mg  refill request received; pt is 85 yrs old, wt-72.1kg, Crea-1.37 on 07/17/2018, last seen by Roby Lofts on 07/08/2018; will send in refill to requested pharmacy.

## 2018-11-04 NOTE — Telephone Encounter (Signed)
Pt change pharmacy to [facility] mail order and would like her Eliquis sent there. Please address

## 2018-11-12 DIAGNOSIS — K210 Gastro-esophageal reflux disease with esophagitis: Secondary | ICD-10-CM | POA: Diagnosis not present

## 2018-11-12 DIAGNOSIS — R112 Nausea with vomiting, unspecified: Secondary | ICD-10-CM | POA: Diagnosis not present

## 2018-11-30 ENCOUNTER — Other Ambulatory Visit (INDEPENDENT_AMBULATORY_CARE_PROVIDER_SITE_OTHER): Payer: Medicare Other

## 2018-11-30 DIAGNOSIS — E1165 Type 2 diabetes mellitus with hyperglycemia: Secondary | ICD-10-CM

## 2018-11-30 DIAGNOSIS — Z794 Long term (current) use of insulin: Secondary | ICD-10-CM

## 2018-11-30 LAB — COMPREHENSIVE METABOLIC PANEL
ALT: 13 U/L (ref 0–35)
AST: 14 U/L (ref 0–37)
Albumin: 4 g/dL (ref 3.5–5.2)
Alkaline Phosphatase: 60 U/L (ref 39–117)
BUN: 21 mg/dL (ref 6–23)
CALCIUM: 9.7 mg/dL (ref 8.4–10.5)
CHLORIDE: 98 meq/L (ref 96–112)
CO2: 31 meq/L (ref 19–32)
Creatinine, Ser: 1.29 mg/dL — ABNORMAL HIGH (ref 0.40–1.20)
GFR: 38.94 mL/min — ABNORMAL LOW (ref >60.00)
Glucose, Bld: 77 mg/dL (ref 70–99)
POTASSIUM: 4.4 meq/L (ref 3.5–5.1)
Sodium: 136 mEq/L (ref 135–145)
Total Bilirubin: 0.4 mg/dL (ref 0.2–1.2)
Total Protein: 7 g/dL (ref 6.0–8.3)

## 2018-11-30 LAB — HEMOGLOBIN A1C: Hgb A1c MFr Bld: 7.7 % — ABNORMAL HIGH (ref 4.6–6.5)

## 2018-12-03 ENCOUNTER — Encounter: Payer: Self-pay | Admitting: Endocrinology

## 2018-12-03 ENCOUNTER — Ambulatory Visit (INDEPENDENT_AMBULATORY_CARE_PROVIDER_SITE_OTHER): Payer: Medicare Other | Admitting: Endocrinology

## 2018-12-03 VITALS — BP 122/68 | HR 62 | Ht 65.0 in | Wt 157.8 lb

## 2018-12-03 DIAGNOSIS — E1142 Type 2 diabetes mellitus with diabetic polyneuropathy: Secondary | ICD-10-CM | POA: Diagnosis not present

## 2018-12-03 DIAGNOSIS — Z794 Long term (current) use of insulin: Secondary | ICD-10-CM

## 2018-12-03 DIAGNOSIS — E063 Autoimmune thyroiditis: Secondary | ICD-10-CM | POA: Diagnosis not present

## 2018-12-03 DIAGNOSIS — N289 Disorder of kidney and ureter, unspecified: Secondary | ICD-10-CM | POA: Diagnosis not present

## 2018-12-03 DIAGNOSIS — E1165 Type 2 diabetes mellitus with hyperglycemia: Secondary | ICD-10-CM

## 2018-12-03 NOTE — Patient Instructions (Signed)
Tandem brand iron  Take Levemir 20 at dinner or bedtime
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
## 2019-01-20 ENCOUNTER — Other Ambulatory Visit: Payer: Self-pay | Admitting: Endocrinology

## 2019-02-04 DIAGNOSIS — N390 Urinary tract infection, site not specified: Secondary | ICD-10-CM | POA: Diagnosis not present

## 2019-02-18 ENCOUNTER — Telehealth: Payer: Self-pay

## 2019-02-18 NOTE — Telephone Encounter (Signed)
I contacted the pt's daughter back and updated pt's meds, allergies and pmh for 02/22/19 visit.

## 2019-02-18 NOTE — Telephone Encounter (Signed)
I contacted the pt's daughter (mindy ok per dpr) In regards to the pt's appt scheduled for 02/22/19. Trying to verify if we could do a virtual video visit.

## 2019-02-18 NOTE — Telephone Encounter (Signed)
Pts daughter called in and stated she could have the pt set up for video visit , she states she will be able to do it at 4pm  [phone]  [employer]  [email or web address]

## 2019-02-18 NOTE — Telephone Encounter (Signed)
Link has been submitted to mobile # provided.

## 2019-02-22 ENCOUNTER — Encounter: Payer: Self-pay | Admitting: Neurology

## 2019-02-22 ENCOUNTER — Ambulatory Visit (INDEPENDENT_AMBULATORY_CARE_PROVIDER_SITE_OTHER): Payer: Medicare Other | Admitting: Neurology

## 2019-02-22 ENCOUNTER — Other Ambulatory Visit: Payer: Self-pay

## 2019-02-22 DIAGNOSIS — G2581 Restless legs syndrome: Secondary | ICD-10-CM

## 2019-02-22 MED ORDER — PRAMIPEXOLE DIHYDROCHLORIDE 1 MG PO TABS: 1.0000 mg | ORAL_TABLET | Freq: Every day | ORAL | 1 refills | Status: DC

## 2019-02-22 NOTE — Progress Notes (Signed)
      Virtual Visit via Video Note  I connected with Melissa [name] on 02/22/19 at  4:00 PM EDT by a video enabled telemedicine application and verified that I am speaking with the correct person using two identifiers.  Location: Patient: The patient is at home. Provider: The provider is in office.   I discussed the limitations of evaluation and management by telemedicine and the availability of in person appointments. The patient expressed understanding and agreed to proceed.  History of Present Illness: Melissa [name] is an 85 year old right-handed white female with a history of restless leg syndrome described as the sensation of bugs crawling on her legs when she tries to lie down and rest.  She does not note any problems when she is up and active during the day, even when she is sitting and resting in the chair.  The patient has noted that Mirapex seems to work the best for her, the Cymbalta was not effective.  She does take Lyrica 50 mg 3 times daily, higher doses have not been more beneficial.  The patient has tried to take 100 mg of Lyrica in the evening without benefit.  She otherwise does not have any other significant medical issues.  She denies any balance problems, no falls.  She does not use a cane or a walker.   Observations/Objective: The video evaluation reveals that the patient is alert and cooperative.  She has a normal speech pattern, no aphasia or dysarthria is noted.  The patient has full extraocular movements.  She has good finger-nose-finger and heel shin bilaterally.  Gait is normal for age.  Tandem gait was not attempted.  Romberg is negative.  The patient is able to protrude the tongue in the midline, good lateral movement of the tongue is noted.  Assessment and Plan: 1.  Restless leg syndrome  The patient will go up on the Mirapex dose, we will start 1 mg of the medication in the evening, she will call for any dose adjustments in the future.  She will follow-up here in  about 6 months.  Follow Up Instructions: 18-month follow-up, may see nurse practitioner.   I discussed the assessment and treatment plan with the patient. The patient was provided an opportunity to ask questions and all were answered. The patient agreed with the plan and demonstrated an understanding of the instructions.   The patient was advised to call back or seek an in-person evaluation if the symptoms worsen or if the condition fails to improve as anticipated.  I provided 15 minutes of non-face-to-face time during this encounter.   Kathrynn Ducking, MD

## 2019-02-25 ENCOUNTER — Telehealth: Payer: Self-pay | Admitting: Neurology

## 2019-02-25 ENCOUNTER — Telehealth: Payer: Self-pay | Admitting: Cardiology

## 2019-02-25 NOTE — Telephone Encounter (Signed)
Virtual Visit Pre-Appointment Phone Call  "(Name), I am calling you today to discuss your upcoming appointment. We are currently trying to limit exposure to the virus that causes COVID-19 by seeing patients at home rather than in the office."  1. "What is the BEST phone number to call the day of the visit?" - include this in appointment notes  2. Do you have or have access to (through a family member/friend) a smartphone with video capability that we can use for your visit?" a. If yes - list this number in appt notes as cell (if different from BEST phone #) and list the appointment type as a VIDEO visit in appointment notes b. If no - list the appointment type as a PHONE visit in appointment notes  3. Confirm consent - "In the setting of the current Covid19 crisis, you are scheduled for a (phone or video) visit with your provider on (date) at (time).  Just as we do with many in-office visits, in order for you to participate in this visit, we must obtain consent.  If you'd like, I can send this to your mychart (if signed up) or email for you to review.  Otherwise, I can obtain your verbal consent now.  All virtual visits are billed to your insurance company just like a normal visit would be.  By agreeing to a virtual visit, we'd like you to understand that the technology does not allow for your provider to perform an examination, and thus may limit your provider's ability to fully assess your condition. If your provider identifies any concerns that need to be evaluated in person, we will make arrangements to do so.  Finally, though the technology is pretty good, we cannot assure that it will always work on either your or our end, and in the setting of a video visit, we may have to convert it to a phone-only visit.  In either situation, we cannot ensure that we have a secure connection.  Are you willing to proceed?"  YES  4. Advise patient to be prepared - "Two hours prior to your appointment, go  ahead and check your blood pressure, pulse, oxygen saturation, and your weight (if you have the equipment to check those) and write them all down. When your visit starts, your provider will ask you for this information. If you have an Apple Watch or Kardia device, please plan to have heart rate information ready on the day of your appointment. Please have a pen and paper handy nearby the day of the visit as well."  5. Give patient instructions for MyChart download to smartphone OR Doximity/Doxy.me as below if video visit (depending on what platform provider is using)  6. Inform patient they will receive a phone call 15 minutes prior to their appointment time (may be from unknown caller ID) so they should be prepared to answer    Melissa [name] has been deemed a candidate for a follow-up tele-health visit to limit community exposure during the Covid-19 pandemic. I spoke with the patient via phone to ensure availability of phone/video source, confirm preferred email & phone number, and discuss instructions and expectations.  I reminded Melissa [name] to be prepared with any vital sign and/or heart rhythm information that could potentially be obtained via home monitoring, at the time of her visit. I reminded Melissa [name] to expect a phone call prior to her visit.  Melissa [name] 02/25/2019 1:41 PM   INSTRUCTIONS FOR  DOWNLOADING THE MYCHART APP TO SMARTPHONE  - The patient must first make sure to have activated MyChart and know their login information - If Apple, go to [employer] and type in MyChart in the search bar and download the app. If Android, ask patient to go to [employer] and type in [facility] in the search bar and download the app. The app is free but as with any other app downloads, their phone may require them to verify saved payment information or Apple/Android password.  - The patient will need to then log into the app with their MyChart username and password,  and select [facility] as their healthcare provider to link the account. When it is time for your visit, go to the MyChart app, find appointments, and click Begin Video Visit. Be sure to Select Allow for your device to access the Microphone and Camera for your visit. You will then be connected, and your provider will be with you shortly.  **If they have any issues connecting, or need assistance please contact MyChart service desk [phone])**  **If using a computer, in order to ensure the best quality for their visit they will need to use either of the following Internet Browsers: [employer], or Google Chrome**  IF USING DOXIMITY or DOXY.ME - The patient will receive a link just prior to their visit by text.     FULL LENGTH CONSENT FOR TELE-HEALTH VISIT   I hereby voluntarily request, consent and authorize [facility] and its employed or contracted physicians, physician assistants, nurse practitioners or other licensed health care professionals (the Practitioner), to provide me with telemedicine health care services (the Services") as deemed necessary by the treating Practitioner. I acknowledge and consent to receive the Services by the Practitioner via telemedicine. I understand that the telemedicine visit will involve communicating with the Practitioner through live audiovisual communication technology and the disclosure of certain medical information by electronic transmission. I acknowledge that I have been given the opportunity to request an in-person assessment or other available alternative prior to the telemedicine visit and am voluntarily participating in the telemedicine visit.  I understand that I have the right to withhold or withdraw my consent to the use of telemedicine in the course of my care at any time, without affecting my right to future care or treatment, and that the Practitioner or I may terminate the telemedicine visit at any time. I understand that I  have the right to inspect all information obtained and/or recorded in the course of the telemedicine visit and may receive copies of available information for a reasonable fee.  I understand that some of the potential risks of receiving the Services via telemedicine include:   Delay or interruption in medical evaluation due to technological equipment failure or disruption;  Information transmitted may not be sufficient (e.g. poor resolution of images) to allow for appropriate medical decision making by the Practitioner; and/or   In rare instances, security protocols could fail, causing a breach of personal health information.  Furthermore, I acknowledge that it is my responsibility to provide information about my medical history, conditions and care that is complete and accurate to the best of my ability. I acknowledge that Practitioner's advice, recommendations, and/or decision may be based on factors not within their control, such as incomplete or inaccurate data provided by me or distortions of diagnostic images or specimens that may result from electronic transmissions. I understand that the practice of medicine is not an exact science and that  Practitioner makes no warranties or guarantees regarding treatment outcomes. I acknowledge that I will receive a copy of this consent concurrently upon execution via email to the email address I last provided but may also request a printed copy by calling the office of [facility].    I understand that my insurance will be billed for this visit.   I have read or had this consent read to me.  I understand the contents of this consent, which adequately explains the benefits and risks of the Services being provided via telemedicine.   I have been provided ample opportunity to ask questions regarding this consent and the Services and have had my questions answered to my satisfaction.  I give my informed consent for the services to be provided through the  use of telemedicine in my medical care  By participating in this telemedicine visit I agree to the above.

## 2019-02-25 NOTE — Telephone Encounter (Signed)
LVM for patient to call back to schedule 6 month follow-up from virtual visit. Schedule with NP per Dr. Jannifer [name].

## 2019-03-01 ENCOUNTER — Telehealth: Payer: Self-pay | Admitting: Cardiology

## 2019-03-01 ENCOUNTER — Encounter: Payer: Self-pay | Admitting: Cardiology

## 2019-03-01 ENCOUNTER — Telehealth (INDEPENDENT_AMBULATORY_CARE_PROVIDER_SITE_OTHER): Payer: Medicare Other | Admitting: Cardiology

## 2019-03-01 ENCOUNTER — Other Ambulatory Visit: Payer: Self-pay

## 2019-03-01 VITALS — BP 125/78 | Ht 65.0 in | Wt 152.0 lb

## 2019-03-01 DIAGNOSIS — E118 Type 2 diabetes mellitus with unspecified complications: Secondary | ICD-10-CM

## 2019-03-01 DIAGNOSIS — Z7901 Long term (current) use of anticoagulants: Secondary | ICD-10-CM

## 2019-03-01 DIAGNOSIS — I420 Dilated cardiomyopathy: Secondary | ICD-10-CM

## 2019-03-01 DIAGNOSIS — I480 Paroxysmal atrial fibrillation: Secondary | ICD-10-CM

## 2019-03-01 DIAGNOSIS — I10 Essential (primary) hypertension: Secondary | ICD-10-CM

## 2019-03-01 DIAGNOSIS — I5022 Chronic systolic (congestive) heart failure: Secondary | ICD-10-CM | POA: Diagnosis not present

## 2019-03-01 NOTE — Telephone Encounter (Signed)
New message:    Patient daughter calling concerning her mother's appt today and not what they should be doing. She has not received and information concering this appt. Please call patient daughter.

## 2019-03-01 NOTE — Patient Instructions (Signed)
Medication Instructions:  The current medical regimen is effective;  continue present plan and medications.  If you need a refill on your cardiac medications before your next appointment, please call your pharmacy.   Follow-Up: Follow up in 6 months with Lori Gerhardt, NP and 1 year with Dr. Skains.  You will receive a letter in the mail 2 months before you are due.  Please call us when you receive this letter to schedule your follow up appointment.  Thank you for choosing [facility]!!     
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
## 2019-03-01 NOTE — Telephone Encounter (Signed)
Left message at # listed in this call for [name] who is actually according to [facility] pt's grand-daughter.  Explained we will call the pts approximately 15 min before to review meds/VS etc. Then send a text to the cell number listed for her to join the appt.  Requested she c/b with any further questions or concerns.

## 2019-03-02 NOTE — Telephone Encounter (Signed)
No return call from [facility].  appt completed.

## 2019-03-03 ENCOUNTER — Ambulatory Visit: Payer: Medicare Other | Admitting: Endocrinology

## 2019-03-15 ENCOUNTER — Encounter: Payer: Self-pay | Admitting: Endocrinology

## 2019-03-15 ENCOUNTER — Other Ambulatory Visit: Payer: Self-pay

## 2019-03-15 ENCOUNTER — Ambulatory Visit (INDEPENDENT_AMBULATORY_CARE_PROVIDER_SITE_OTHER): Payer: Medicare Other | Admitting: Endocrinology

## 2019-03-15 DIAGNOSIS — E1165 Type 2 diabetes mellitus with hyperglycemia: Secondary | ICD-10-CM

## 2019-03-15 DIAGNOSIS — Z794 Long term (current) use of insulin: Secondary | ICD-10-CM | POA: Diagnosis not present
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
## 2019-03-16 DIAGNOSIS — M48061 Spinal stenosis, lumbar region without neurogenic claudication: Secondary | ICD-10-CM | POA: Diagnosis not present

## 2019-03-16 DIAGNOSIS — M5137 Other intervertebral disc degeneration, lumbosacral region: Secondary | ICD-10-CM | POA: Diagnosis not present

## 2019-04-05 DIAGNOSIS — M25552 Pain in left hip: Secondary | ICD-10-CM | POA: Diagnosis not present

## 2019-04-05 DIAGNOSIS — M7062 Trochanteric bursitis, left hip: Secondary | ICD-10-CM | POA: Diagnosis not present

## 2019-04-15 ENCOUNTER — Other Ambulatory Visit: Payer: Self-pay

## 2019-04-15 MED ORDER — OZEMPIC (0.25 OR 0.5 MG/DOSE) 2 MG/1.5ML ~~LOC~~ SOPN: 0.2500 mg | PEN_INJECTOR | SUBCUTANEOUS | 3 refills | Status: DC

## 2019-04-19 ENCOUNTER — Other Ambulatory Visit: Payer: Self-pay | Admitting: Endocrinology

## 2019-04-19 ENCOUNTER — Other Ambulatory Visit: Payer: Self-pay

## 2019-04-19 DIAGNOSIS — M7061 Trochanteric bursitis, right hip: Secondary | ICD-10-CM | POA: Diagnosis not present

## 2019-04-19 DIAGNOSIS — M7062 Trochanteric bursitis, left hip: Secondary | ICD-10-CM | POA: Diagnosis not present

## 2019-04-19 MED ORDER — LIRAGLUTIDE 18 MG/3ML ~~LOC~~ SOPN: PEN_INJECTOR | SUBCUTANEOUS | 3 refills | Status: DC

## 2019-04-20 ENCOUNTER — Telehealth: Payer: Self-pay | Admitting: Endocrinology

## 2019-04-20 NOTE — Telephone Encounter (Signed)
Ozempic is a covered alternative. Would you like to do PA or change to Ozempic?

## 2019-04-20 NOTE — Telephone Encounter (Signed)
I had already sent a note back on the faxed paper to start Ozempic 0.25 mg weekly for the first 4 weeks and then 0.5 mg weekly

## 2019-04-20 NOTE — Telephone Encounter (Signed)
Pt granddaughter [name] called stating that the patients Victoza is requiring PA  PA # [phone] [employer]

## 2019-04-21 ENCOUNTER — Other Ambulatory Visit: Payer: Self-pay

## 2019-04-21 MED ORDER — OZEMPIC (0.25 OR 0.5 MG/DOSE) 2 MG/1.5ML ~~LOC~~ SOPN: 0.2500 mg | PEN_INJECTOR | SUBCUTANEOUS | 3 refills | Status: DC

## 2019-04-21 NOTE — Telephone Encounter (Signed)
Rx has been sent. Pt's daughter notified of this medication change, and daughter stated that she would also notify the grand daugther.

## 2019-04-23 DIAGNOSIS — M7061 Trochanteric bursitis, right hip: Secondary | ICD-10-CM | POA: Diagnosis not present

## 2019-04-23 DIAGNOSIS — M7062 Trochanteric bursitis, left hip: Secondary | ICD-10-CM | POA: Diagnosis not present

## 2019-04-26 DIAGNOSIS — H353134 Nonexudative age-related macular degeneration, bilateral, advanced atrophic with subfoveal involvement: Secondary | ICD-10-CM | POA: Diagnosis not present

## 2019-04-26 DIAGNOSIS — H43813 Vitreous degeneration, bilateral: Secondary | ICD-10-CM | POA: Diagnosis not present

## 2019-04-26 DIAGNOSIS — R51 Headache: Secondary | ICD-10-CM | POA: Diagnosis not present

## 2019-04-30 DIAGNOSIS — M7061 Trochanteric bursitis, right hip: Secondary | ICD-10-CM | POA: Diagnosis not present

## 2019-04-30 DIAGNOSIS — M7062 Trochanteric bursitis, left hip: Secondary | ICD-10-CM | POA: Diagnosis not present

## 2019-05-03 DIAGNOSIS — M7062 Trochanteric bursitis, left hip: Secondary | ICD-10-CM | POA: Diagnosis not present

## 2019-05-03 DIAGNOSIS — M7061 Trochanteric bursitis, right hip: Secondary | ICD-10-CM | POA: Diagnosis not present

## 2019-05-05 ENCOUNTER — Other Ambulatory Visit (INDEPENDENT_AMBULATORY_CARE_PROVIDER_SITE_OTHER): Payer: Medicare Other

## 2019-05-05 ENCOUNTER — Other Ambulatory Visit: Payer: Self-pay | Admitting: Endocrinology

## 2019-05-05 ENCOUNTER — Other Ambulatory Visit: Payer: Self-pay

## 2019-05-05 ENCOUNTER — Ambulatory Visit (INDEPENDENT_AMBULATORY_CARE_PROVIDER_SITE_OTHER): Payer: Medicare Other

## 2019-05-05 DIAGNOSIS — E063 Autoimmune thyroiditis: Secondary | ICD-10-CM

## 2019-05-05 DIAGNOSIS — E782 Mixed hyperlipidemia: Secondary | ICD-10-CM

## 2019-05-05 DIAGNOSIS — Z794 Long term (current) use of insulin: Secondary | ICD-10-CM

## 2019-05-05 DIAGNOSIS — E039 Hypothyroidism, unspecified: Secondary | ICD-10-CM

## 2019-05-05 DIAGNOSIS — E1165 Type 2 diabetes mellitus with hyperglycemia: Secondary | ICD-10-CM

## 2019-05-05 LAB — POCT GLYCOSYLATED HEMOGLOBIN (HGB A1C): Hemoglobin A1C: 6.1 % — AB (ref 4.0–5.6)

## 2019-05-06 LAB — COMPREHENSIVE METABOLIC PANEL
ALT: 14 U/L (ref 0–35)
AST: 17 U/L (ref 0–37)
Albumin: 4.5 g/dL (ref 3.5–5.2)
Alkaline Phosphatase: 53 U/L (ref 39–117)
BUN: 27 mg/dL — ABNORMAL HIGH (ref 6–23)
CO2: 32 mEq/L (ref 19–32)
Calcium: 9.9 mg/dL (ref 8.4–10.5)
Chloride: 98 mEq/L (ref 96–112)
Creatinine, Ser: 1.53 mg/dL — ABNORMAL HIGH (ref 0.40–1.20)
GFR: 31.95 mL/min — ABNORMAL LOW (ref >60.00)
Glucose, Bld: 89 mg/dL (ref 70–99)
Potassium: 5.3 mEq/L — ABNORMAL HIGH (ref 3.5–5.1)
Sodium: 136 mEq/L (ref 135–145)
Total Bilirubin: 0.3 mg/dL (ref 0.2–1.2)
Total Protein: 7.6 g/dL (ref 6.0–8.3)

## 2019-05-06 LAB — LIPID PANEL
Cholesterol: 147 mg/dL (ref 0–200)
HDL: 38 mg/dL — ABNORMAL LOW (ref >39.00)
NonHDL: 108.83
Total CHOL/HDL Ratio: 4
Triglycerides: 303 mg/dL — ABNORMAL HIGH (ref 0.0–149.0)
VLDL: 60.6 mg/dL — ABNORMAL HIGH (ref 0.0–40.0)

## 2019-05-06 LAB — LDL CHOLESTEROL, DIRECT: Direct LDL: 64 mg/dL

## 2019-05-06 LAB — T4, FREE: Free T4: 0.95 ng/dL (ref 0.60–1.60)

## 2019-05-06 LAB — TSH: TSH: 1.72 u[IU]/mL (ref 0.35–4.50)

## 2019-05-07 DIAGNOSIS — M7062 Trochanteric bursitis, left hip: Secondary | ICD-10-CM | POA: Diagnosis not present

## 2019-05-07 DIAGNOSIS — M7061 Trochanteric bursitis, right hip: Secondary | ICD-10-CM | POA: Diagnosis not present

## 2019-05-10 DIAGNOSIS — M7061 Trochanteric bursitis, right hip: Secondary | ICD-10-CM | POA: Diagnosis not present

## 2019-05-10 DIAGNOSIS — M7062 Trochanteric bursitis, left hip: Secondary | ICD-10-CM | POA: Diagnosis not present

## 2019-05-12 ENCOUNTER — Other Ambulatory Visit: Payer: Self-pay

## 2019-05-12 ENCOUNTER — Ambulatory Visit (INDEPENDENT_AMBULATORY_CARE_PROVIDER_SITE_OTHER): Payer: Medicare Other | Admitting: Endocrinology

## 2019-05-12 DIAGNOSIS — Z794 Long term (current) use of insulin: Secondary | ICD-10-CM

## 2019-05-12 DIAGNOSIS — E875 Hyperkalemia: Secondary | ICD-10-CM | POA: Diagnosis not present

## 2019-05-12 DIAGNOSIS — N289 Disorder of kidney and ureter, unspecified: Secondary | ICD-10-CM

## 2019-05-12 DIAGNOSIS — E782 Mixed hyperlipidemia: Secondary | ICD-10-CM | POA: Diagnosis not present

## 2019-05-12 DIAGNOSIS — E1165 Type 2 diabetes mellitus with hyperglycemia: Secondary | ICD-10-CM | POA: Diagnosis not present

## 2019-05-12 DIAGNOSIS — E063 Autoimmune thyroiditis: Secondary | ICD-10-CM | POA: Diagnosis not present

## 2019-05-12 DIAGNOSIS — E1142 Type 2 diabetes mellitus with diabetic polyneuropathy: Secondary | ICD-10-CM | POA: Diagnosis not present
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
## 2019-05-13 DIAGNOSIS — M7062 Trochanteric bursitis, left hip: Secondary | ICD-10-CM | POA: Diagnosis not present

## 2019-05-13 DIAGNOSIS — M7061 Trochanteric bursitis, right hip: Secondary | ICD-10-CM | POA: Diagnosis not present

## 2019-05-14 ENCOUNTER — Telehealth: Payer: Self-pay | Admitting: Cardiology

## 2019-05-14 NOTE — Telephone Encounter (Signed)
Let's change lasix 20mg  to every other day. [name] alerted me to increased creatinine. She is also watching the potassium in her diet.   Thanks [name], MD

## 2019-05-14 NOTE — Telephone Encounter (Signed)
Pt's daughter aware of recommendations and verbalizes understanding ./cy

## 2019-05-14 NOTE — Telephone Encounter (Signed)
Left message on VM for pt to call back to discuss need for medication change.

## 2019-05-14 NOTE — Telephone Encounter (Signed)
Follow up     Pts daughter is returning a call     Please call back

## 2019-05-21 DIAGNOSIS — M7062 Trochanteric bursitis, left hip: Secondary | ICD-10-CM | POA: Diagnosis not present

## 2019-05-21 DIAGNOSIS — E1121 Type 2 diabetes mellitus with diabetic nephropathy: Secondary | ICD-10-CM | POA: Diagnosis not present

## 2019-05-21 DIAGNOSIS — Z794 Long term (current) use of insulin: Secondary | ICD-10-CM | POA: Diagnosis not present

## 2019-05-21 DIAGNOSIS — M7061 Trochanteric bursitis, right hip: Secondary | ICD-10-CM | POA: Diagnosis not present

## 2019-05-21 DIAGNOSIS — N184 Chronic kidney disease, stage 4 (severe): Secondary | ICD-10-CM | POA: Diagnosis not present

## 2019-05-21 DIAGNOSIS — D500 Iron deficiency anemia secondary to blood loss (chronic): Secondary | ICD-10-CM | POA: Diagnosis not present

## 2019-05-24 DIAGNOSIS — M5137 Other intervertebral disc degeneration, lumbosacral region: Secondary | ICD-10-CM | POA: Diagnosis not present

## 2019-05-24 DIAGNOSIS — E785 Hyperlipidemia, unspecified: Secondary | ICD-10-CM | POA: Diagnosis not present

## 2019-05-24 DIAGNOSIS — E1121 Type 2 diabetes mellitus with diabetic nephropathy: Secondary | ICD-10-CM | POA: Diagnosis not present

## 2019-05-24 DIAGNOSIS — F331 Major depressive disorder, recurrent, moderate: Secondary | ICD-10-CM | POA: Diagnosis not present

## 2019-05-24 DIAGNOSIS — N184 Chronic kidney disease, stage 4 (severe): Secondary | ICD-10-CM | POA: Diagnosis not present

## 2019-05-24 DIAGNOSIS — Z0000 Encounter for general adult medical examination without abnormal findings: Secondary | ICD-10-CM | POA: Diagnosis not present

## 2019-05-24 DIAGNOSIS — D500 Iron deficiency anemia secondary to blood loss (chronic): Secondary | ICD-10-CM | POA: Diagnosis not present

## 2019-05-24 DIAGNOSIS — R251 Tremor, unspecified: Secondary | ICD-10-CM | POA: Diagnosis not present

## 2019-05-24 DIAGNOSIS — G609 Hereditary and idiopathic neuropathy, unspecified: Secondary | ICD-10-CM | POA: Diagnosis not present

## 2019-05-24 DIAGNOSIS — I10 Essential (primary) hypertension: Secondary | ICD-10-CM | POA: Diagnosis not present

## 2019-05-24 DIAGNOSIS — D692 Other nonthrombocytopenic purpura: Secondary | ICD-10-CM | POA: Diagnosis not present

## 2019-05-24 DIAGNOSIS — I480 Paroxysmal atrial fibrillation: Secondary | ICD-10-CM | POA: Diagnosis not present

## 2019-05-25 DIAGNOSIS — M7062 Trochanteric bursitis, left hip: Secondary | ICD-10-CM | POA: Diagnosis not present

## 2019-05-25 DIAGNOSIS — M7061 Trochanteric bursitis, right hip: Secondary | ICD-10-CM | POA: Diagnosis not present

## 2019-05-26 ENCOUNTER — Telehealth: Payer: Self-pay | Admitting: Neurology

## 2019-05-26 NOTE — Telephone Encounter (Signed)
Blood work results were sent over from the primary care physician done on 21 May 2019:  White blood count 6.8, hemoglobin 13.9, hematocrit 41.6, MCV of 87.7, platelets of 205. Ferritin level is normal at 82.2.  Glucose is 153, vitamin D level of 39.86.

## 2019-06-14 ENCOUNTER — Ambulatory Visit: Payer: Medicare Other | Admitting: Endocrinology

## 2019-06-22 DIAGNOSIS — M7062 Trochanteric bursitis, left hip: Secondary | ICD-10-CM | POA: Diagnosis not present

## 2019-07-06 DIAGNOSIS — F411 Generalized anxiety disorder: Secondary | ICD-10-CM | POA: Diagnosis not present

## 2019-07-06 DIAGNOSIS — Z8679 Personal history of other diseases of the circulatory system: Secondary | ICD-10-CM | POA: Diagnosis not present

## 2019-07-06 DIAGNOSIS — N289 Disorder of kidney and ureter, unspecified: Secondary | ICD-10-CM | POA: Diagnosis not present

## 2019-07-06 DIAGNOSIS — R05 Cough: Secondary | ICD-10-CM | POA: Diagnosis not present

## 2019-07-07 DIAGNOSIS — R05 Cough: Secondary | ICD-10-CM | POA: Diagnosis not present

## 2019-07-09 ENCOUNTER — Other Ambulatory Visit: Payer: Self-pay | Admitting: Cardiology

## 2019-07-09 DIAGNOSIS — I480 Paroxysmal atrial fibrillation: Secondary | ICD-10-CM

## 2019-07-09 NOTE — Telephone Encounter (Signed)
Prescription refill request for Eliquis received.  Last office visit: [place] (03-01-2019) Scr: 1.53 (05-06-2019) Age: 85 y.o. Weight: 71.6 kg   Prescription refill sent.

## 2019-07-10 ENCOUNTER — Emergency Department (HOSPITAL_COMMUNITY): Payer: Medicare Other

## 2019-07-10 ENCOUNTER — Inpatient Hospital Stay (HOSPITAL_COMMUNITY)
Admission: EM | Admit: 2019-07-10 | Discharge: 2019-07-27 | DRG: 177 | Disposition: A | Discharge status: Home Health | Payer: Medicare Other | Attending: Family Medicine | Admitting: Family Medicine

## 2019-07-10 ENCOUNTER — Other Ambulatory Visit: Payer: Self-pay

## 2019-07-10 ENCOUNTER — Encounter (HOSPITAL_COMMUNITY): Payer: Self-pay

## 2019-07-10 DIAGNOSIS — G8929 Other chronic pain: Secondary | ICD-10-CM | POA: Diagnosis present

## 2019-07-10 DIAGNOSIS — J90 Pleural effusion, not elsewhere classified: Secondary | ICD-10-CM | POA: Diagnosis not present

## 2019-07-10 DIAGNOSIS — J180 Bronchopneumonia, unspecified organism: Secondary | ICD-10-CM | POA: Diagnosis not present

## 2019-07-10 DIAGNOSIS — Z90710 Acquired absence of both cervix and uterus: Secondary | ICD-10-CM

## 2019-07-10 DIAGNOSIS — J9601 Acute respiratory failure with hypoxia: Secondary | ICD-10-CM | POA: Diagnosis not present

## 2019-07-10 DIAGNOSIS — J168 Pneumonia due to other specified infectious organisms: Secondary | ICD-10-CM | POA: Diagnosis not present

## 2019-07-10 DIAGNOSIS — N183 Chronic kidney disease, stage 3 unspecified: Secondary | ICD-10-CM | POA: Diagnosis present

## 2019-07-10 DIAGNOSIS — E7800 Pure hypercholesterolemia, unspecified: Secondary | ICD-10-CM | POA: Diagnosis not present

## 2019-07-10 DIAGNOSIS — J690 Pneumonitis due to inhalation of food and vomit: Principal | ICD-10-CM | POA: Diagnosis present

## 2019-07-10 DIAGNOSIS — J189 Pneumonia, unspecified organism: Secondary | ICD-10-CM

## 2019-07-10 DIAGNOSIS — Z79899 Other long term (current) drug therapy: Secondary | ICD-10-CM

## 2019-07-10 DIAGNOSIS — J181 Lobar pneumonia, unspecified organism: Secondary | ICD-10-CM

## 2019-07-10 DIAGNOSIS — Z823 Family history of stroke: Secondary | ICD-10-CM

## 2019-07-10 DIAGNOSIS — Z8673 Personal history of transient ischemic attack (TIA), and cerebral infarction without residual deficits: Secondary | ICD-10-CM

## 2019-07-10 DIAGNOSIS — I10 Essential (primary) hypertension: Secondary | ICD-10-CM | POA: Diagnosis present

## 2019-07-10 DIAGNOSIS — J470 Bronchiectasis with acute lower respiratory infection: Secondary | ICD-10-CM | POA: Diagnosis present

## 2019-07-10 DIAGNOSIS — R0600 Dyspnea, unspecified: Secondary | ICD-10-CM | POA: Diagnosis present

## 2019-07-10 DIAGNOSIS — E785 Hyperlipidemia, unspecified: Secondary | ICD-10-CM | POA: Diagnosis present

## 2019-07-10 DIAGNOSIS — I519 Heart disease, unspecified: Secondary | ICD-10-CM | POA: Diagnosis present

## 2019-07-10 DIAGNOSIS — I5023 Acute on chronic systolic (congestive) heart failure: Secondary | ICD-10-CM | POA: Diagnosis present

## 2019-07-10 DIAGNOSIS — I428 Other cardiomyopathies: Secondary | ICD-10-CM | POA: Diagnosis not present

## 2019-07-10 DIAGNOSIS — G2581 Restless legs syndrome: Secondary | ICD-10-CM | POA: Diagnosis present

## 2019-07-10 DIAGNOSIS — R000 Tachycardia, unspecified: Secondary | ICD-10-CM | POA: Diagnosis not present

## 2019-07-10 DIAGNOSIS — I340 Nonrheumatic mitral (valve) insufficiency: Secondary | ICD-10-CM | POA: Diagnosis not present

## 2019-07-10 DIAGNOSIS — Z7901 Long term (current) use of anticoagulants: Secondary | ICD-10-CM

## 2019-07-10 DIAGNOSIS — Z79890 Hormone replacement therapy (postmenopausal): Secondary | ICD-10-CM

## 2019-07-10 DIAGNOSIS — J9621 Acute and chronic respiratory failure with hypoxia: Secondary | ICD-10-CM | POA: Diagnosis not present

## 2019-07-10 DIAGNOSIS — E1140 Type 2 diabetes mellitus with diabetic neuropathy, unspecified: Secondary | ICD-10-CM

## 2019-07-10 DIAGNOSIS — E875 Hyperkalemia: Secondary | ICD-10-CM | POA: Diagnosis not present

## 2019-07-10 DIAGNOSIS — I493 Ventricular premature depolarization: Secondary | ICD-10-CM | POA: Diagnosis present

## 2019-07-10 DIAGNOSIS — I959 Hypotension, unspecified: Secondary | ICD-10-CM | POA: Diagnosis not present

## 2019-07-10 DIAGNOSIS — R1310 Dysphagia, unspecified: Secondary | ICD-10-CM | POA: Diagnosis present

## 2019-07-10 DIAGNOSIS — Z87891 Personal history of nicotine dependence: Secondary | ICD-10-CM | POA: Diagnosis not present

## 2019-07-10 DIAGNOSIS — Z9049 Acquired absence of other specified parts of digestive tract: Secondary | ICD-10-CM

## 2019-07-10 DIAGNOSIS — I4891 Unspecified atrial fibrillation: Secondary | ICD-10-CM

## 2019-07-10 DIAGNOSIS — M549 Dorsalgia, unspecified: Secondary | ICD-10-CM | POA: Diagnosis present

## 2019-07-10 DIAGNOSIS — I480 Paroxysmal atrial fibrillation: Secondary | ICD-10-CM | POA: Diagnosis present

## 2019-07-10 DIAGNOSIS — E1122 Type 2 diabetes mellitus with diabetic chronic kidney disease: Secondary | ICD-10-CM | POA: Diagnosis present

## 2019-07-10 DIAGNOSIS — K760 Fatty (change of) liver, not elsewhere classified: Secondary | ICD-10-CM | POA: Diagnosis present

## 2019-07-10 DIAGNOSIS — I130 Hypertensive heart and chronic kidney disease with heart failure and stage 1 through stage 4 chronic kidney disease, or unspecified chronic kidney disease: Secondary | ICD-10-CM | POA: Diagnosis not present

## 2019-07-10 DIAGNOSIS — Z794 Long term (current) use of insulin: Secondary | ICD-10-CM

## 2019-07-10 DIAGNOSIS — Z23 Encounter for immunization: Secondary | ICD-10-CM

## 2019-07-10 DIAGNOSIS — Z20828 Contact with and (suspected) exposure to other viral communicable diseases: Secondary | ICD-10-CM | POA: Diagnosis present

## 2019-07-10 DIAGNOSIS — I5022 Chronic systolic (congestive) heart failure: Secondary | ICD-10-CM

## 2019-07-10 DIAGNOSIS — R05 Cough: Secondary | ICD-10-CM | POA: Diagnosis not present

## 2019-07-10 DIAGNOSIS — R918 Other nonspecific abnormal finding of lung field: Secondary | ICD-10-CM | POA: Diagnosis not present

## 2019-07-10 DIAGNOSIS — E1142 Type 2 diabetes mellitus with diabetic polyneuropathy: Secondary | ICD-10-CM | POA: Diagnosis not present

## 2019-07-10 DIAGNOSIS — E039 Hypothyroidism, unspecified: Secondary | ICD-10-CM | POA: Diagnosis not present

## 2019-07-10 DIAGNOSIS — R0602 Shortness of breath: Secondary | ICD-10-CM | POA: Diagnosis not present

## 2019-07-10 DIAGNOSIS — D638 Anemia in other chronic diseases classified elsewhere: Secondary | ICD-10-CM | POA: Diagnosis present

## 2019-07-10 DIAGNOSIS — J479 Bronchiectasis, uncomplicated: Secondary | ICD-10-CM

## 2019-07-10 LAB — CBC WITH DIFFERENTIAL/PLATELET
Abs Immature Granulocytes: 0.1 10*3/uL — ABNORMAL HIGH (ref 0.00–0.07)
Basophils Absolute: 0 10*3/uL (ref 0.0–0.1)
Basophils Relative: 0 %
Eosinophils Absolute: 0.2 10*3/uL (ref 0.0–0.5)
Eosinophils Relative: 2 %
HCT: 36.7 % (ref 36.0–46.0)
Hemoglobin: 12.3 g/dL (ref 12.0–15.0)
Immature Granulocytes: 1 %
Lymphocytes Relative: 9 %
Lymphs Abs: 1.3 10*3/uL (ref 0.7–4.0)
MCH: 29.4 pg (ref 26.0–34.0)
MCHC: 33.5 g/dL (ref 30.0–36.0)
MCV: 87.6 fL (ref 80.0–100.0)
Monocytes Absolute: 1.4 10*3/uL — ABNORMAL HIGH (ref 0.1–1.0)
Monocytes Relative: 10 %
Neutro Abs: 11.1 10*3/uL — ABNORMAL HIGH (ref 1.7–7.7)
Neutrophils Relative %: 78 %
Platelets: 250 10*3/uL (ref 150–400)
RBC: 4.19 MIL/uL (ref 3.87–5.11)
RDW: 13.2 % (ref 11.5–15.5)
WBC: 14.1 10*3/uL — ABNORMAL HIGH (ref 4.0–10.5)
nRBC: 0 % (ref 0.0–0.2)

## 2019-07-10 LAB — BRAIN NATRIURETIC PEPTIDE: B Natriuretic Peptide: 234.4 pg/mL — ABNORMAL HIGH (ref 0.0–100.0)

## 2019-07-10 LAB — CBG MONITORING, ED
Glucose-Capillary: 130 mg/dL — ABNORMAL HIGH (ref 70–99)
Glucose-Capillary: 189 mg/dL — ABNORMAL HIGH (ref 70–99)

## 2019-07-10 LAB — COMPREHENSIVE METABOLIC PANEL
ALT: 14 U/L (ref 0–44)
AST: 19 U/L (ref 15–41)
Albumin: 2.7 g/dL — ABNORMAL LOW (ref 3.5–5.0)
Alkaline Phosphatase: 89 U/L (ref 38–126)
Anion gap: 11 (ref 5–15)
BUN: 23 mg/dL (ref 8–23)
CO2: 26 mmol/L (ref 22–32)
Calcium: 8.4 mg/dL — ABNORMAL LOW (ref 8.9–10.3)
Chloride: 99 mmol/L (ref 98–111)
Creatinine, Ser: 1.55 mg/dL — ABNORMAL HIGH (ref 0.44–1.00)
GFR calc Af Amer: 34 mL/min — ABNORMAL LOW (ref >60)
GFR calc non Af Amer: 29 mL/min — ABNORMAL LOW (ref >60)
Glucose, Bld: 188 mg/dL — ABNORMAL HIGH (ref 70–99)
Potassium: 3.5 mmol/L (ref 3.5–5.1)
Sodium: 136 mmol/L (ref 135–145)
Total Bilirubin: 0.5 mg/dL (ref 0.3–1.2)
Total Protein: 7 g/dL (ref 6.5–8.1)

## 2019-07-10 LAB — SARS CORONAVIRUS 2 BY RT PCR (HOSPITAL ORDER, PERFORMED IN ~~LOC~~ HOSPITAL LAB): SARS Coronavirus 2: NEGATIVE

## 2019-07-10 MED ORDER — PANTOPRAZOLE SODIUM 40 MG PO TBEC
40.0000 mg | DELAYED_RELEASE_TABLET | Freq: Every day | ORAL | Status: DC
  Administered 2019-07-11 – 2019-07-27 (×17): 40 mg via ORAL
  Filled 2019-07-10 (×17): qty 1

## 2019-07-10 MED ORDER — INSULIN ASPART 100 UNIT/ML ~~LOC~~ SOLN
0.0000 [IU] | Freq: Every day | SUBCUTANEOUS | Status: DC
  Administered 2019-07-14 – 2019-07-19 (×2): 2 [IU] via SUBCUTANEOUS
  Administered 2019-07-20 – 2019-07-21 (×2): 3 [IU] via SUBCUTANEOUS
  Administered 2019-07-22: 2 [IU] via SUBCUTANEOUS
  Administered 2019-07-23: 5 [IU] via SUBCUTANEOUS
  Administered 2019-07-24 – 2019-07-25 (×2): 2 [IU] via SUBCUTANEOUS
  Administered 2019-07-26: 3 [IU] via SUBCUTANEOUS

## 2019-07-10 MED ORDER — AZITHROMYCIN 250 MG PO TABS
500.0000 mg | ORAL_TABLET | Freq: Once | ORAL | Status: AC
  Administered 2019-07-10: 19:00:00 500 mg via ORAL
  Filled 2019-07-10: qty 2

## 2019-07-10 MED ORDER — ONDANSETRON HCL 4 MG/2ML IJ SOLN
4.0000 mg | Freq: Once | INTRAMUSCULAR | Status: AC
  Administered 2019-07-10: 20:00:00 4 mg via INTRAVENOUS
  Filled 2019-07-10: qty 2

## 2019-07-10 MED ORDER — APIXABAN 5 MG PO TABS
5.0000 mg | ORAL_TABLET | Freq: Two times a day (BID) | ORAL | Status: DC
  Administered 2019-07-10 – 2019-07-27 (×34): 5 mg via ORAL
  Filled 2019-07-10 (×36): qty 1

## 2019-07-10 MED ORDER — METOPROLOL TARTRATE 5 MG/5ML IV SOLN
2.5000 mg | Freq: Once | INTRAVENOUS | Status: AC
  Administered 2019-07-10: 20:00:00 2.5 mg via INTRAVENOUS
  Filled 2019-07-10: qty 5

## 2019-07-10 MED ORDER — PROSIGHT PO TABS
1.0000 | ORAL_TABLET | Freq: Two times a day (BID) | ORAL | Status: DC
  Administered 2019-07-11 – 2019-07-27 (×32): 1 via ORAL
  Filled 2019-07-10 (×35): qty 1

## 2019-07-10 MED ORDER — IPRATROPIUM BROMIDE 0.06 % NA SOLN: 1.0000 | Freq: Three times a day (TID) | NASAL | Status: DC

## 2019-07-10 MED ORDER — TRAZODONE HCL 100 MG PO TABS
100.0000 mg | ORAL_TABLET | Freq: Every day | ORAL | Status: DC
  Administered 2019-07-10 – 2019-07-26 (×17): 100 mg via ORAL
  Filled 2019-07-10 (×2): qty 1
  Filled 2019-07-10: qty 2
  Filled 2019-07-10 (×14): qty 1

## 2019-07-10 MED ORDER — ONDANSETRON HCL 4 MG PO TABS: 4.0000 mg | ORAL_TABLET | Freq: Three times a day (TID) | ORAL | Status: DC | PRN

## 2019-07-10 MED ORDER — IPRATROPIUM BROMIDE HFA 17 MCG/ACT IN AERS
2.0000 | INHALATION_SPRAY | Freq: Once | RESPIRATORY_TRACT | Status: AC
  Administered 2019-07-10: 18:00:00 2 via RESPIRATORY_TRACT
  Filled 2019-07-10: qty 12.9

## 2019-07-10 MED ORDER — HYDROCODONE-HOMATROPINE 5-1.5 MG/5ML PO SYRP
5.0000 mL | ORAL_SOLUTION | ORAL | Status: DC | PRN
  Administered 2019-07-10 – 2019-07-24 (×19): 5 mL via ORAL
  Filled 2019-07-10 (×19): qty 5

## 2019-07-10 MED ORDER — LEVOTHYROXINE SODIUM 50 MCG PO TABS
50.0000 ug | ORAL_TABLET | Freq: Every day | ORAL | Status: DC
  Administered 2019-07-11 – 2019-07-27 (×17): 50 ug via ORAL
  Filled 2019-07-10 (×17): qty 1

## 2019-07-10 MED ORDER — FLUOXETINE HCL 20 MG PO CAPS
20.0000 mg | ORAL_CAPSULE | Freq: Every day | ORAL | Status: DC
  Administered 2019-07-10 – 2019-07-27 (×18): 20 mg via ORAL
  Filled 2019-07-10 (×19): qty 1

## 2019-07-10 MED ORDER — FUROSEMIDE 20 MG PO TABS
20.0000 mg | ORAL_TABLET | Freq: Every day | ORAL | Status: DC
  Administered 2019-07-11: 10:00:00 20 mg via ORAL
  Filled 2019-07-10: qty 1

## 2019-07-10 MED ORDER — SEMAGLUTIDE(0.25 OR 0.5MG/DOS) 2 MG/1.5ML ~~LOC~~ SOPN: 0.2500 mg | PEN_INJECTOR | SUBCUTANEOUS | Status: DC

## 2019-07-10 MED ORDER — TAB-A-VITE/IRON PO TABS
1.0000 | ORAL_TABLET | Freq: Every day | ORAL | Status: DC
  Administered 2019-07-11 – 2019-07-27 (×17): 1 via ORAL
  Filled 2019-07-10 (×18): qty 1

## 2019-07-10 MED ORDER — INSULIN ASPART 100 UNIT/ML ~~LOC~~ SOLN
0.0000 [IU] | Freq: Three times a day (TID) | SUBCUTANEOUS | Status: DC
  Administered 2019-07-11: 1 [IU] via SUBCUTANEOUS
  Administered 2019-07-12: 2 [IU] via SUBCUTANEOUS
  Administered 2019-07-12 – 2019-07-13 (×2): 1 [IU] via SUBCUTANEOUS
  Administered 2019-07-13 – 2019-07-16 (×5): 2 [IU] via SUBCUTANEOUS
  Administered 2019-07-17 – 2019-07-18 (×4): 1 [IU] via SUBCUTANEOUS
  Administered 2019-07-19 (×2): 2 [IU] via SUBCUTANEOUS
  Administered 2019-07-20: 1 [IU] via SUBCUTANEOUS
  Administered 2019-07-20: 3 [IU] via SUBCUTANEOUS
  Administered 2019-07-21: 5 [IU] via SUBCUTANEOUS
  Administered 2019-07-21: 2 [IU] via SUBCUTANEOUS
  Administered 2019-07-21: 3 [IU] via SUBCUTANEOUS
  Administered 2019-07-22: 2 [IU] via SUBCUTANEOUS
  Administered 2019-07-22: 5 [IU] via SUBCUTANEOUS
  Administered 2019-07-22 – 2019-07-23 (×4): 3 [IU] via SUBCUTANEOUS
  Administered 2019-07-24: 5 [IU] via SUBCUTANEOUS
  Administered 2019-07-24: 2 [IU] via SUBCUTANEOUS
  Administered 2019-07-24 – 2019-07-25 (×2): 5 [IU] via SUBCUTANEOUS
  Administered 2019-07-25: 7 [IU] via SUBCUTANEOUS
  Administered 2019-07-25: 3 [IU] via SUBCUTANEOUS
  Administered 2019-07-26: 5 [IU] via SUBCUTANEOUS
  Administered 2019-07-26 (×2): 7 [IU] via SUBCUTANEOUS
  Administered 2019-07-27: 1 [IU] via SUBCUTANEOUS
  Administered 2019-07-27: 3 [IU] via SUBCUTANEOUS

## 2019-07-10 MED ORDER — ACETAMINOPHEN 500 MG PO TABS
1000.0000 mg | ORAL_TABLET | Freq: Four times a day (QID) | ORAL | Status: DC | PRN
  Administered 2019-07-12 – 2019-07-25 (×4): 1000 mg via ORAL
  Filled 2019-07-10 (×4): qty 2

## 2019-07-10 MED ORDER — SODIUM CHLORIDE 0.9 % IV SOLN: 1.0000 g | INTRAVENOUS | Status: DC

## 2019-07-10 MED ORDER — SODIUM CHLORIDE 0.9 % IV SOLN
1.0000 g | Freq: Once | INTRAVENOUS | Status: AC
  Administered 2019-07-10: 1 g via INTRAVENOUS
  Filled 2019-07-10: qty 10

## 2019-07-10 MED ORDER — LEVALBUTEROL HCL 1.25 MG/0.5ML IN NEBU
1.2500 mg | INHALATION_SOLUTION | RESPIRATORY_TRACT | Status: DC | PRN
  Administered 2019-07-19 – 2019-07-24 (×4): 1.25 mg via RESPIRATORY_TRACT
  Filled 2019-07-10 (×5): qty 0.5

## 2019-07-10 MED ORDER — PRAMIPEXOLE DIHYDROCHLORIDE 0.25 MG PO TABS
0.7500 mg | ORAL_TABLET | Freq: Every day | ORAL | Status: DC
  Administered 2019-07-11 – 2019-07-26 (×16): 0.75 mg via ORAL
  Filled 2019-07-10 (×18): qty 3

## 2019-07-10 MED ORDER — ALBUTEROL SULFATE HFA 108 (90 BASE) MCG/ACT IN AERS
2.0000 | INHALATION_SPRAY | Freq: Once | RESPIRATORY_TRACT | Status: DC
  Filled 2019-07-10: qty 6.7

## 2019-07-10 MED ORDER — SODIUM CHLORIDE 0.9 % IV SOLN
500.0000 mg | INTRAVENOUS | Status: DC
  Administered 2019-07-11 – 2019-07-13 (×3): 500 mg via INTRAVENOUS
  Filled 2019-07-10 (×4): qty 500

## 2019-07-10 MED ORDER — PREGABALIN 25 MG PO CAPS
50.0000 mg | ORAL_CAPSULE | Freq: Three times a day (TID) | ORAL | Status: DC
  Administered 2019-07-11 – 2019-07-27 (×49): 50 mg via ORAL
  Filled 2019-07-10 (×47): qty 2
  Filled 2019-07-10: qty 1
  Filled 2019-07-10 (×2): qty 2

## 2019-07-10 MED ORDER — ATORVASTATIN CALCIUM 40 MG PO TABS
40.0000 mg | ORAL_TABLET | Freq: Every day | ORAL | Status: DC
  Administered 2019-07-11 – 2019-07-27 (×17): 40 mg via ORAL
  Filled 2019-07-10 (×17): qty 1

## 2019-07-10 MED ORDER — VITAMIN D 25 MCG (1000 UNIT) PO TABS
1000.0000 [IU] | ORAL_TABLET | Freq: Every day | ORAL | Status: DC
  Administered 2019-07-11 – 2019-07-27 (×17): 1000 [IU] via ORAL
  Filled 2019-07-10 (×17): qty 1

## 2019-07-10 MED ORDER — OMEGA-3-ACID ETHYL ESTERS 1 G PO CAPS
1000.0000 mg | ORAL_CAPSULE | Freq: Every day | ORAL | Status: DC
  Administered 2019-07-11 – 2019-07-27 (×17): 1000 mg via ORAL
  Filled 2019-07-10 (×19): qty 1

## 2019-07-10 MED ORDER — METOPROLOL SUCCINATE ER 25 MG PO TB24
37.5000 mg | ORAL_TABLET | Freq: Every day | ORAL | Status: DC
  Administered 2019-07-10: 22:00:00 37.5 mg via ORAL
  Administered 2019-07-11: 50 mg via ORAL
  Filled 2019-07-10 (×2): qty 2

## 2019-07-10 MED ORDER — INSULIN DETEMIR 100 UNIT/ML ~~LOC~~ SOLN
20.0000 [IU] | Freq: Every day | SUBCUTANEOUS | Status: DC
  Administered 2019-07-10: 22:00:00 20 [IU] via SUBCUTANEOUS
  Filled 2019-07-10 (×2): qty 0.2

## 2019-07-10 MED ORDER — DULOXETINE HCL 60 MG PO CPEP: 60.0000 mg | ORAL_CAPSULE | Freq: Every day | ORAL | Status: DC

## 2019-07-10 MED ORDER — LEVALBUTEROL HCL 1.25 MG/0.5ML IN NEBU
1.2500 mg | INHALATION_SOLUTION | RESPIRATORY_TRACT | Status: DC | PRN
  Administered 2019-07-11: 06:00:00 1.25 mg via RESPIRATORY_TRACT
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
## 2019-07-10 NOTE — ED Provider Notes (Signed)
This patient is an 85 year old female who has a known poor ejection fraction, echocardiogram from January 2018 shows 15 to 20%, she is also diabetic has high blood pressure, former smoker and high cholesterol.  She presents with increasing shortness of breath and cough.  Her initial symptoms were coughing with some posttussive emesis which started 2 weeks ago this progressed into some more shortness of breath both on exertion and at rest as well as some nocturnal dyspnea.  She has orthopnea.  She was seen on Tuesday at her doctor's office where she was given a Zithromax prescription and sent for COVID testing on Wednesday which has since resulted as negative.  She continues to cough continues to feel short of breath and feels better with oxygen.  She denies any significant swelling of the legs.  On exam the patient does have scattered rales that seem to clear with deep breathing, she is tachypneic, speaks in short sentences, has no JVD or peripheral edema.  Abdomen is soft and nontender, the patient is otherwise not tachycardic febrile or hypotensive.  I personally viewed and interpreted the x-ray of the chest which shows multifocal bronchopneumonia.  I do not see any significant large infiltrates, there is no pneumothorax or abnormal mediastinum.  Labs show leukocytosis but no other significant abnormal labs.  She has a known renal insufficiency which seems to be at baseline.  We will discuss with pulmonary critical care regarding antibiotic choices, she likely needs to be admitted given her level of dyspnea and a BNP will be added to see if there is some component which is congestive heart failure.  The patient is agreeable as is the daughter.  I discussed care with the pulmonologist who recommends Rocephin and Zithromax, oxygenating at 92 to 93% but very dyspneic on ambulation.  Will admit to the hospital service.  I discussed with Dr. Steffanie Dunn who has been kind enough to admit and care for this nice  patient.  Medical screening examination/treatment/procedure(s) were conducted as a shared visit with non-physician practitioner(s) and myself.  I personally evaluated the patient during the encounter.  Clinical Impression:   Final diagnoses:  Pneumonia due to infectious organism, unspecified laterality, unspecified part of lung         Noemi Chapel, MD 07/10/19 [place]

## 2019-07-10 NOTE — ED Notes (Signed)
Patient ambulated in hall with O2 sats about 93%, lowest to 88%. Increased respiratory rate. 3 coughing episodes during ambulation. O2 of 89% and RR of 26 immediately after ambulation.

## 2019-07-10 NOTE — ED Notes (Signed)
Patient placed on hospital bed for comfort °

## 2019-07-10 NOTE — ED Notes (Signed)
Melissa [name] [phone] Granddaughter is sitting outside

## 2019-07-10 NOTE — ED Triage Notes (Signed)
Pt presents with SOB x2 weeks, states she thinks she has pneumonia, sleeps in the recliner due to unable to lay flat
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
## 2019-07-11 DIAGNOSIS — J9601 Acute respiratory failure with hypoxia: Secondary | ICD-10-CM

## 2019-07-11 DIAGNOSIS — Z7901 Long term (current) use of anticoagulants: Secondary | ICD-10-CM

## 2019-07-11 DIAGNOSIS — N183 Chronic kidney disease, stage 3 (moderate): Secondary | ICD-10-CM

## 2019-07-11 DIAGNOSIS — J189 Pneumonia, unspecified organism: Secondary | ICD-10-CM

## 2019-07-11 DIAGNOSIS — J181 Lobar pneumonia, unspecified organism: Secondary | ICD-10-CM

## 2019-07-11 LAB — BASIC METABOLIC PANEL
Anion gap: 10 (ref 5–15)
BUN: 22 mg/dL (ref 8–23)
CO2: 28 mmol/L (ref 22–32)
Calcium: 8 mg/dL — ABNORMAL LOW (ref 8.9–10.3)
Chloride: 100 mmol/L (ref 98–111)
Creatinine, Ser: 1.44 mg/dL — ABNORMAL HIGH (ref 0.44–1.00)
GFR calc Af Amer: 37 mL/min — ABNORMAL LOW (ref >60)
GFR calc non Af Amer: 32 mL/min — ABNORMAL LOW (ref >60)
Glucose, Bld: 91 mg/dL (ref 70–99)
Potassium: 3.8 mmol/L (ref 3.5–5.1)
Sodium: 138 mmol/L (ref 135–145)

## 2019-07-11 LAB — CBG MONITORING, ED
Glucose-Capillary: 88 mg/dL (ref 70–99)
Glucose-Capillary: 124 mg/dL — ABNORMAL HIGH (ref 70–99)

## 2019-07-11 LAB — GLUCOSE, CAPILLARY
Glucose-Capillary: 148 mg/dL — ABNORMAL HIGH (ref 70–99)
Glucose-Capillary: 150 mg/dL — ABNORMAL HIGH (ref 70–99)
Glucose-Capillary: 153 mg/dL — ABNORMAL HIGH (ref 70–99)

## 2019-07-11 LAB — CBC
HCT: 33.1 % — ABNORMAL LOW (ref 36.0–46.0)
Hemoglobin: 11 g/dL — ABNORMAL LOW (ref 12.0–15.0)
MCH: 29.8 pg (ref 26.0–34.0)
MCHC: 33.2 g/dL (ref 30.0–36.0)
MCV: 89.7 fL (ref 80.0–100.0)
Platelets: 232 10*3/uL (ref 150–400)
RBC: 3.69 MIL/uL — ABNORMAL LOW (ref 3.87–5.11)
RDW: 13.6 % (ref 11.5–15.5)
WBC: 14.4 10*3/uL — ABNORMAL HIGH (ref 4.0–10.5)
nRBC: 0 % (ref 0.0–0.2)

## 2019-07-11 LAB — HIV ANTIBODY (ROUTINE TESTING W REFLEX): HIV Screen 4th Generation wRfx: NONREACTIVE

## 2019-07-11 LAB — EXPECTORATED SPUTUM ASSESSMENT W GRAM STAIN, RFLX TO RESP C

## 2019-07-11 MED ORDER — SODIUM CHLORIDE 0.9 % IV BOLUS
500.0000 mL | Freq: Once | INTRAVENOUS | Status: AC
  Administered 2019-07-11: 500 mL via INTRAVENOUS

## 2019-07-11 MED ORDER — IPRATROPIUM BROMIDE 0.02 % IN SOLN
0.5000 mg | Freq: Four times a day (QID) | RESPIRATORY_TRACT | Status: DC
  Administered 2019-07-11 – 2019-07-12 (×4): 0.5 mg via RESPIRATORY_TRACT
  Filled 2019-07-11 (×5): qty 2.5

## 2019-07-11 MED ORDER — INSULIN DETEMIR 100 UNIT/ML ~~LOC~~ SOLN
12.0000 [IU] | Freq: Every day | SUBCUTANEOUS | Status: DC
  Administered 2019-07-11 – 2019-07-26 (×16): 12 [IU] via SUBCUTANEOUS
  Filled 2019-07-11 (×17): qty 0.12

## 2019-07-11 MED ORDER — PIPERACILLIN-TAZOBACTAM 3.375 G IVPB
3.3750 g | Freq: Three times a day (TID) | INTRAVENOUS | Status: DC
  Filled 2019-07-11: qty 50

## 2019-07-11 MED ORDER — POTASSIUM CHLORIDE IN NACL 20-0.9 MEQ/L-% IV SOLN
INTRAVENOUS | Status: DC
  Administered 2019-07-11: 22:00:00 via INTRAVENOUS
  Filled 2019-07-11: qty 1000

## 2019-07-11 MED ORDER — PIPERACILLIN-TAZOBACTAM 3.375 G IVPB
3.3750 g | Freq: Three times a day (TID) | INTRAVENOUS | Status: DC
  Administered 2019-07-11 – 2019-07-14 (×10): 3.375 g via INTRAVENOUS
  Filled 2019-07-11 (×10): qty 50

## 2019-07-11 MED ORDER — METOPROLOL SUCCINATE ER 25 MG PO TB24
12.5000 mg | ORAL_TABLET | Freq: Every day | ORAL | Status: DC
  Administered 2019-07-12 – 2019-07-27 (×16): 12.5 mg via ORAL
  Filled 2019-07-11 (×16): qty 1

## 2019-07-11 MED ORDER — LEVALBUTEROL HCL 1.25 MG/0.5ML IN NEBU
1.2500 mg | INHALATION_SOLUTION | Freq: Four times a day (QID) | RESPIRATORY_TRACT | Status: DC
  Administered 2019-07-11: 09:00:00 1.25 mg via RESPIRATORY_TRACT
  Filled 2019-07-11: qty 0.5

## 2019-07-11 MED ORDER — LEVALBUTEROL HCL 1.25 MG/0.5ML IN NEBU
1.2500 mg | INHALATION_SOLUTION | Freq: Four times a day (QID) | RESPIRATORY_TRACT | Status: DC
  Administered 2019-07-11 – 2019-07-12 (×3): 1.25 mg via RESPIRATORY_TRACT
  Filled 2019-07-11 (×4): qty 0.5

## 2019-07-11 MED ORDER — BUDESONIDE 0.25 MG/2ML IN SUSP
0.2500 mg | Freq: Two times a day (BID) | RESPIRATORY_TRACT | Status: DC
  Administered 2019-07-12 – 2019-07-16 (×9): 0.25 mg via RESPIRATORY_TRACT
  Filled 2019-07-11 (×10): qty 2

## 2019-07-11 NOTE — ED Notes (Signed)
Pt's breakfast tray arrived 

## 2019-07-11 NOTE — ED Notes (Signed)
Tele   Breakfast ordered  

## 2019-07-11 NOTE — ED Notes (Signed)
RT aware of HHN orders.

## 2019-07-11 NOTE — ED Notes (Signed)
Pt's CBG result was 88. Informed Becky - RN.

## 2019-07-11 NOTE — ED Notes (Signed)
Assumed care of pt, congested non productive cough noted. Pt on 2L 02. Call bell at bedside, lights dimmed per pt request, bed in low position.  Pt on CCM, will continue to monitor.

## 2019-07-11 NOTE — ED Notes (Signed)
Family member at bedside. Pt and family member aware pt has bed assigned and will be transported soon.

## 2019-07-11 NOTE — Progress Notes (Signed)
PROGRESS NOTE  Melissa [name] ZOX:[id number] DOB: 02-May-1928 DOA: 07/10/2019 PCP: [name], MD  Brief History:  85 y.o. female with medical history significant for systolic CHF (81%), DM2, HTN, Afib on Eliquis, and HLD who presented to the ED today with c/o cough for the past 2 weeks. Pt has a h/o recurrent pneumonia and chronic cough and typically has recurrences about this time every year. She is followed by Dr. [name] in [facility] and was last seen there in July 2019. High res CT in Sept 2019 showed bronchiectatic changes and scarring, with concern for possible MAI vs COP vs recurrent aspiration. She has been doing fairly well since then except for chronic dry cough. 2 weeks ago cough began to worsen, and became productive of thick mucous. She has had no fevers. She was seen in clinic (in the parking lot) on Tuesday and started on a Z pack. Had COVID test on Wed at urgent care which was negative. Despite azithromycin she has worsened and now is coughing so badly she is having frequent post-tussive emesis.  Because of worsening cough and shortness of breath, the patient's daughter brought her to the emergency department for further evaluation.  Assessment/Plan: Acute respiratory failure with hypoxia -Secondary to pneumonia -Presently stable on 2 L nasal cannula -Wean oxygen for saturation greater 92%  Lobar pneumonia -Personally reviewed chest x-ray-bilateral ill-defined opacities -Given the patient's frequent antibiotic exposure and concerns for aspiration, broaden antibiotic coverage -Check procalcitonin in a.m. for possible de-escalation  Bronchiectasis -September 2019 CT as discussed above -Start Xopenex and Atrovent -Start Pulmicort as the patient has some mild wheezing  Paroxysmal atrial fibrillation -Personally reviewed EKG--sinus rhythm with PACs, nonspecific T wave change -Continue apixaban -Continue metoprolol succinate  Diabetes mellitus type 2 -05/05/2019  hemoglobin A1c 6.1 -Continue reduced dose Levemir -NovoLog sliding scale  CKD stage III -Baseline creatinine 1.2-1.5 -A.m. BMP  Chronic systolic CHF -1/91/4782 echo EF 15-20%, trivial MR -Clinically euvolemic -Continue home dose of furosemide -Continue metoprolol succinate  Hyperlipidemia -Continue statin  Hypothyroidism -Continue Synthroid -July 2020 TSH 1.72      Disposition Plan:   Home in 1-2 days  Family Communication:  No Family at bedside  Consultants:  none  Code Status:  FULL  DVT Prophylaxis:  apixaban  Procedures: As Listed in Progress Note Above  Antibiotics: Zosyn 9/27>>>     Subjective: Patient is breathing a little better this morning but still remains short of breath with minimal exertion.  She complains of a nonproductive cough.  She denies any nausea, vomiting, diarrhea, abdominal pain, chest pain, fevers, headache, neck pain.  Objective: Vitals:   07/11/19 0228 07/11/19 0414 07/11/19 0500 07/11/19 0600  BP: (!) 119/58 116/67 (!) 122/58 109/84  Pulse: 94 67 89 91  Resp: 14 20 (!) 21 18  Temp:      TempSrc:      SpO2: 93% (!) 86% 96% 94%   No intake or output data in the 24 hours ending 07/11/19 0803 Weight change:  Exam:   General:  Pt is alert, follows commands appropriately, not in acute distress  HEENT: No icterus, No thrush, No neck mass, [place]/AT  Cardiovascular: RRR, S1/S2, no rubs, no gallops  Respiratory: Bilateral scattered rales.  Bibasilar minimal wheeze  Abdomen: Soft/+BS, non tender, non distended, no guarding  Extremities: No edema, No lymphangitis, No petechiae, No rashes, no synovitis   Data Reviewed: I have personally reviewed following labs and imaging studies  Basic Metabolic Panel: Recent Labs  Lab 07/10/19 1503 07/11/19 0615  NA 136 138  K 3.5 3.8  CL 99 100  CO2 26 28  GLUCOSE 188* 91  BUN 23 22  CREATININE 1.55* 1.44*  CALCIUM 8.4* 8.0*   Liver Function Tests: Recent Labs  Lab 07/10/19  1503  AST 19  ALT 14  ALKPHOS 89  BILITOT 0.5  PROT 7.0  ALBUMIN 2.7*   No results for input(s): LIPASE, AMYLASE in the last 168 hours. No results for input(s): AMMONIA in the last 168 hours. Coagulation Profile: No results for input(s): INR, PROTIME in the last 168 hours. CBC: Recent Labs  Lab 07/10/19 1503 07/11/19 0615  WBC 14.1* 14.4*  NEUTROABS 11.1*  --   HGB 12.3 11.0*  HCT 36.7 33.1*  MCV 87.6 89.7  PLT 250 232   Cardiac Enzymes: No results for input(s): CKTOTAL, CKMB, CKMBINDEX, TROPONINI in the last 168 hours. BNP: Invalid input(s): POCBNP CBG: Recent Labs  Lab 07/10/19 1737 07/10/19 2159  GLUCAP 130* 189*   HbA1C: No results for input(s): HGBA1C in the last 72 hours. Urine analysis:    Component Value Date/Time   COLORURINE YELLOW 10/19/2016 2036   APPEARANCEUR CLEAR 10/19/2016 2036   LABSPEC 1.030 10/19/2016 2036   PHURINE 6.0 10/19/2016 2036   GLUCOSEU NEGATIVE 10/19/2016 2036   GLUCOSEU NEGATIVE 01/12/2015 0922   HGBUR NEGATIVE 10/19/2016 2036   BILIRUBINUR NEGATIVE 10/19/2016 2036   BILIRUBINUR negative 05/10/2016 1314   KETONESUR NEGATIVE 10/19/2016 2036   PROTEINUR NEGATIVE 10/19/2016 2036   UROBILINOGEN 0.2 05/10/2016 1314   UROBILINOGEN 0.2 07/10/2015 2301   NITRITE NEGATIVE 10/19/2016 2036   LEUKOCYTESUR NEGATIVE 10/19/2016 2036   Sepsis Labs: @LABRCNTIP (procalcitonin:4,lacticidven:4) ) Recent Results (from the past 240 hour(s))  SARS Coronavirus 2 [facility] order, Performed in [facility] hospital lab) Nasopharyngeal Nasopharyngeal Swab     Status: None   Collection Time: 07/10/19  6:46 PM   Specimen: Nasopharyngeal Swab  Result Value Ref Range Status   SARS Coronavirus 2 NEGATIVE NEGATIVE Final    Comment: (NOTE) If result is NEGATIVE SARS-CoV-2 target nucleic acids are NOT DETECTED. The SARS-CoV-2 RNA is generally detectable in upper and lower  respiratory specimens during the acute phase of infection. The lowest   concentration of SARS-CoV-2 viral copies this assay can detect is 250  copies / mL. A negative result does not preclude SARS-CoV-2 infection  and should not be used as the sole basis for treatment or other  patient management decisions.  A negative result may occur with  improper specimen collection / handling, submission of specimen other  than nasopharyngeal swab, presence of viral mutation(s) within the  areas targeted by this assay, and inadequate number of viral copies  (<250 copies / mL). A negative result must be combined with clinical  observations, patient history, and epidemiological information. If result is POSITIVE SARS-CoV-2 target nucleic acids are DETECTED. The SARS-CoV-2 RNA is generally detectable in upper and lower  respiratory specimens dur ing the acute phase of infection.  Positive  results are indicative of active infection with SARS-CoV-2.  Clinical  correlation with patient history and other diagnostic information is  necessary to determine patient infection status.  Positive results do  not rule out bacterial infection or co-infection with other viruses. If result is PRESUMPTIVE POSTIVE SARS-CoV-2 nucleic acids MAY BE PRESENT.   A presumptive positive result was obtained on the submitted specimen  and confirmed on repeat testing.  While 2019 novel coronavirus  (SARS-CoV-2)  nucleic acids may be present in the submitted sample  additional confirmatory testing may be necessary for epidemiological  and / or clinical management purposes  to differentiate between  SARS-CoV-2 and other Sarbecovirus currently known to infect humans.  If clinically indicated additional testing with an alternate test  methodology [phone]) is advised. The SARS-CoV-2 RNA is generally  detectable in upper and lower respiratory sp ecimens during the acute  phase of infection. The expected result is Negative. Fact Sheet for Patients:  StrictlyIdeas.no Fact Sheet  for Healthcare Providers: BankingDealers.co.za This test is not yet approved or cleared by the [place] FDA and has been authorized for detection and/or diagnosis of SARS-CoV-2 by FDA under an Emergency Use Authorization (EUA).  This EUA will remain in effect (meaning this test can be used) for the duration of the COVID-19 declaration under Section 564(b)(1) of the Act, 21 U.S.C. section 360bbb-3(b)(1), unless the authorization is terminated or revoked sooner. Performed at [facility], [place] [address], [place], [place] [address]      Scheduled Meds: . albuterol  2 puff Inhalation Once  . apixaban  5 mg Oral BID  . atorvastatin  40 mg Oral Daily  . cholecalciferol  1,000 Units Oral Daily  . FLUoxetine  20 mg Oral Daily  . furosemide  20 mg Oral Daily  . insulin aspart  0-5 Units Subcutaneous QHS  . insulin aspart  0-9 Units Subcutaneous TID WC  . insulin detemir  20 Units Subcutaneous Daily  . levothyroxine  50 mcg Oral Daily  . metoprolol succinate  37.5 mg Oral Daily  . multivitamin  1 tablet Oral BID  . multivitamins with iron  1 tablet Oral Daily  . omega-3 acid ethyl esters  1,000 mg Oral Daily  . pantoprazole  40 mg Oral Daily  . pramipexole  0.75 mg Oral QHS  . pregabalin  50 mg Oral TID  . Semaglutide(0.25 or 0.5MG /DOS)  0.25 mg Subcutaneous Weekly  . traZODone  100 mg Oral QHS   Continuous Infusions: . azithromycin    . cefTRIAXone (ROCEPHIN)  IV      Procedures/Studies: Dg Chest 2 View  Result Date: 07/10/2019 CLINICAL DATA:  Shortness of breath. EXAM: CHEST - 2 VIEW COMPARISON:  Radiograph of Feb 27, 2018. CT scan of March 27, 2018 and July 08, 2018. FINDINGS: The heart size and mediastinal contours are within normal limits. No pneumothorax or pleural effusion is noted. There are multiple ill-defined opacities throughout both lungs, some of which were present on prior exam and some of which are new. The visualized skeletal  structures are unremarkable. IMPRESSION: Multiple ill-defined opacities are noted throughout both lungs which are consistent with recurrent bronchopneumonia as described on prior CT scan. Aortic Atherosclerosis (ICD10-I70.0). Electronically Signed   By: Marijo Conception M.D.   On: 07/10/2019 15:37    Orson Eva, DO  [facility] Pager [phone]  If 7PM-7AM, please contact night-coverage [email or web address] Password [email or web address] 07/11/2019, 8:03 AM   LOS: 1 day

## 2019-07-11 NOTE — Progress Notes (Signed)
Pt's granddaughter [name] [phone] would like a provider to call with an update 07/12/2019

## 2019-07-11 NOTE — ED Notes (Signed)
Due to nearly continuous coughing, PRN Xopenex treatment given.

## 2019-07-11 NOTE — ED Notes (Signed)
Admitting MD in w pt

## 2019-07-11 NOTE — ED Notes (Signed)
Pt being transported via bed w/O2 @ 2L/min and telemetry to 2W.

## 2019-07-11 NOTE — ED Notes (Signed)
Pt provided PRN cough meds for near continuous cough, pt repositioned more upright in bed for comfort.

## 2019-07-11 NOTE — ED Notes (Signed)
Specimen cup given so may obtain sputum specimen as pt noted w/yellow, thick sputum on tissues at bedside.

## 2019-07-12 LAB — CBC
HCT: 30.6 % — ABNORMAL LOW (ref 36.0–46.0)
Hemoglobin: 10 g/dL — ABNORMAL LOW (ref 12.0–15.0)
MCH: 29.6 pg (ref 26.0–34.0)
MCHC: 32.7 g/dL (ref 30.0–36.0)
MCV: 90.5 fL (ref 80.0–100.0)
Platelets: 211 10*3/uL (ref 150–400)
RBC: 3.38 MIL/uL — ABNORMAL LOW (ref 3.87–5.11)
RDW: 14 % (ref 11.5–15.5)
WBC: 11.7 10*3/uL — ABNORMAL HIGH (ref 4.0–10.5)
nRBC: 0 % (ref 0.0–0.2)

## 2019-07-12 LAB — BASIC METABOLIC PANEL
Anion gap: 8 (ref 5–15)
BUN: 18 mg/dL (ref 8–23)
CO2: 27 mmol/L (ref 22–32)
Calcium: 7.5 mg/dL — ABNORMAL LOW (ref 8.9–10.3)
Chloride: 103 mmol/L (ref 98–111)
Creatinine, Ser: 1.38 mg/dL — ABNORMAL HIGH (ref 0.44–1.00)
GFR calc Af Amer: 39 mL/min — ABNORMAL LOW (ref >60)
GFR calc non Af Amer: 34 mL/min — ABNORMAL LOW (ref >60)
Glucose, Bld: 113 mg/dL — ABNORMAL HIGH (ref 70–99)
Potassium: 3.9 mmol/L (ref 3.5–5.1)
Sodium: 138 mmol/L (ref 135–145)

## 2019-07-12 LAB — PROCALCITONIN: Procalcitonin: 0.16 ng/mL

## 2019-07-12 LAB — GLUCOSE, CAPILLARY: Glucose-Capillary: 145 mg/dL — ABNORMAL HIGH (ref 70–99)

## 2019-07-12 LAB — MAGNESIUM: Magnesium: 1.2 mg/dL — ABNORMAL LOW (ref 1.7–2.4)

## 2019-07-12 MED ORDER — IPRATROPIUM BROMIDE 0.02 % IN SOLN
0.5000 mg | Freq: Three times a day (TID) | RESPIRATORY_TRACT | Status: DC
  Administered 2019-07-12 – 2019-07-13 (×2): 0.5 mg via RESPIRATORY_TRACT
  Filled 2019-07-12 (×2): qty 2.5

## 2019-07-12 MED ORDER — LIDOCAINE 5 % EX PTCH
1.0000 | MEDICATED_PATCH | CUTANEOUS | Status: DC
  Administered 2019-07-12 – 2019-07-27 (×12): 1 via TRANSDERMAL
  Filled 2019-07-12 (×12): qty 1

## 2019-07-12 MED ORDER — LEVALBUTEROL HCL 1.25 MG/0.5ML IN NEBU
1.2500 mg | INHALATION_SOLUTION | Freq: Three times a day (TID) | RESPIRATORY_TRACT | Status: DC
  Administered 2019-07-12 – 2019-07-13 (×2): 1.25 mg via RESPIRATORY_TRACT
  Filled 2019-07-12 (×2): qty 0.5

## 2019-07-12 MED ORDER — MAGNESIUM SULFATE 2 GM/50ML IV SOLN
2.0000 g | Freq: Once | INTRAVENOUS | Status: AC
  Administered 2019-07-12: 2 g via INTRAVENOUS
  Filled 2019-07-12: qty 50

## 2019-07-12 NOTE — Significant Event (Addendum)
Rapid Response Event Note  Overview: "Second Set of Eyes"  Initial Focused Assessment: Asked by nurse to see Melissa [name] for increased fatigue and overall presentation. I came by and saw Melissa [name], she was sitting up in the chair, appears frail and perhaps fatigued. Melissa [name] was awake/alert and oriented x 3, she stated she feels better today than she did yesterday. She stated that her cough was better, she was able to sleep through night, and that she overall seems to be better today. Lungs - clear in the upper fields, mildly diminished in the bases, overall good air movement. Skin pale and cool to touch, +1/+2 edema in all extremities, + pulses. Not in overt distress, denied pain except having some chronic back pain. Patient is voiding but she states she normal voids more at home but also drinks more at home. BPs today SBP 100-120, MAPs 60s, HR 90s, 100% on 4L [place], RR 16-20.   Interventions: -- NO RRT INTERVENTIONS   Plan of Care: -- Monitor VS, Strict I/Os -- H/H are stable, no overt signs of bleeding  -- EF in 2018 - 15-20% - monitor for signs of CHF exacerbation, volume overload, and give fluids judiciously if needed, if BP remains stable, perhaps starting home meds (diuretics). -- Encourage OOB and ambulation, IS/CDB -- Aspiration Precautions  -- Lidocaine Patch for back pain  -- Per nurse, she updated the MD earlier of her concerns.   Event Summary:  Start Time 1520 End Time 1545   Chenelle Benning R

## 2019-07-12 NOTE — Progress Notes (Addendum)
PROGRESS NOTE  Melissa [name] VPX:[id number] DOB: [birth year] DOA: 07/10/2019 PCP: [name], MD  Brief History:  85 y.o. female with medical history significant for chronic systolic CHF (46%), DM2, HTN, Afib on Eliquis, and HLD who presented to the ED with c/o cough for the past 2 weeks. Pt has a h/o recurrent pneumonia and chronic cough and typically has recurrences about this time every year. She is followed by Dr. [name] in [facility] and was last seen there in July 2019. High res CT in Sept 2019 showed bronchiectatic changes and scarring, with concern for possible MAI vs COP vs recurrent aspiration. She had been doing fairly well since then except for chronic dry cough. 2 weeks PTA cough began to worsen, and became productive of thick mucous. She has had no fevers. She was seen in clinic (in the parking lot) on Tuesday of week PTA and started on a Z pack. Had COVID test on Wed at urgent care which was negative. Despite azithromycin she worsened and was coughing so badly that she was having frequent post-tussive emesis.  Because of worsening cough and shortness of breath, the patient's daughter brought her to the emergency department for further evaluation. She was admitted for Acute respiratory failure d/t bronchopneumonia complicating underlying bronchiectasis..  Assessment/Plan: Acute respiratory failure with hypoxia -Secondary to pneumonia complicating underlying bronchiectasis. -Presently saturating 100% on 4L/min Lane Oxygen> wean as able for sats > 92%. - Covid testing negative  Bronchopneumonia - CXR personally reviewed, noted multiple b/l multiple patchy ill defined opacities. Also reported as consistent with recurrent Bronchopneumonia. -Given the patient's frequent antibiotic exposure and concerns for aspiration, broaden antibiotic coverage and currently on IV Zosyn day 2 & IV Azithromycin day 3. - Pro calcitonin 0.16 - Sputum culture negative to date. - Would consider  Pulmonology consult if not improving or if worsens.  Bronchiectasis - HRCT Chest from 07/08/2018 appreciated - MBSS 05/19/2018 showed mild aspiration risks. - Continue Xopenex, Atrovent & Pulmicort - This could be a Bronchiectasis flare   Paroxysmal atrial fibrillation - Personally reviewed EKG--sinus rhythm with PAC's and some PVC's -Continue apixaban -Continue metoprolol succinate  Diabetes mellitus type 2 -05/05/2019 hemoglobin A1c 6.1 -Continue reduced dose Levemir -NovoLog sliding scale - Reasonable IP control  CKD stage III -Baseline creatinine 1.2-1.5 - Creat gradually decreased from 1.5 on admission to 1.38 today.  Chronic systolic CHF -2/70/3500 echo EF 15-20%, trivial MR - Trace ankle edema otherwise appears euvolemic. -Continue home dose of furosemide -Continue metoprolol succinate - Sees [name], Cardiology as OP.  Hyperlipidemia -Continue statin and Lovaza.  Hypothyroidism -Continue Synthroid -July 2020 TSH 1.72  Chronic back pain - Lidocaine patch as per reported home regimen.  Hypomagnesemia - Replace IV and f/u Mg in am.  Anemia, suspect chronic disease - Follow CBC's.  Disposition Plan:  DC home pending clinical improvement, hopefully in next 1-2 days Family Communication:  Discussed with granddaughter in detail, updated care and answered questions.   Consultants:  none  Code Status:  FULL  DVT Prophylaxis:  apixaban  Procedures: None  Antibiotics: Zosyn 9/27>>>     Subjective: Reports that she lives with her daughter and son in law, IADL, not on home oxygen. When seen this morning reported feeling about the same as on admission with ongoing intermittent cough with intermittent yellow sputum, no CP or SOB. Denied choking difficulty with eating or drinking. Subsequently RN updated that she has chronic back pain and uses  Lidocaine patch at home (although not listed on home med list) and fatigue. Subsequently RN had RRT evaluate her and  she appeared stable.  Objective: Vitals:   07/11/19 2340 07/12/19 0735 07/12/19 1302 07/12/19 1435  BP:  (!) 116/51 (!) 105/42   Pulse:  93 93   Resp:  17 17   Temp:  97.7 F (36.5 C) 98.6 F (37 C)   TempSrc:  Oral Oral   SpO2: 97% 93% 100% 100%  Weight:      Height:        Intake/Output Summary (Last 24 hours) at 07/12/2019 1638 Last data filed at 07/12/2019 5329 Gross per 24 hour  Intake 2231.81 ml  Output 1 ml  Net 2230.81 ml   Weight change:  Exam:   General:  Pleasant elderly female, small built and frail, sitting up comfortably at edge of bed without distress.  Cardiovascular: S1 and S2 hear, RRR. No JVD, murmurs or gallops. Telemetry personally reviewed and findings and noted above.  Respiratory: Clear to auscultation anteriorly. Slightly harsh BS's posteriorly without rhonchi/wheezing. Occasional basal crackles.   Abdomen: Soft/+BS, non tender, non distended, no guarding.No organomegaly/masses appreciated. Normal bowel sounds heard.  Extremities: symmetric normal power in all limbs.  CNS: Alert and oriented. No focal deficits. Head tremors/titubation. Flat affect.   Data Reviewed: I have personally reviewed following labs and imaging studies Basic Metabolic Panel: Recent Labs  Lab 07/10/19 1503 07/11/19 0615 07/12/19 0439  NA 136 138 138  K 3.5 3.8 3.9  CL 99 100 103  CO2 26 28 27   GLUCOSE 188* 91 113*  BUN 23 22 18   CREATININE 1.55* 1.44* 1.38*  CALCIUM 8.4* 8.0* 7.5*  MG  --   --  1.2*   Liver Function Tests: Recent Labs  Lab 07/10/19 1503  AST 19  ALT 14  ALKPHOS 89  BILITOT 0.5  PROT 7.0  ALBUMIN 2.7*   CBC: Recent Labs  Lab 07/10/19 1503 07/11/19 0615 07/12/19 0439  WBC 14.1* 14.4* 11.7*  NEUTROABS 11.1*  --   --   HGB 12.3 11.0* 10.0*  HCT 36.7 33.1* 30.6*  MCV 87.6 89.7 90.5  PLT 250 232 211   Cardiac Enzymes: No results for input(s): CKTOTAL, CKMB, CKMBINDEX, TROPONINI in the last 168 hours. BNP: Invalid input(s):  POCBNP CBG: Recent Labs  Lab 07/11/19 1225 07/11/19 1645 07/11/19 2126 07/11/19 2152 07/12/19 1223  GLUCAP 124* 148* 150* 153* 145*     Recent Results (from the past 240 hour(s))  SARS Coronavirus 2 [facility] order, Performed in [facility] hospital lab) Nasopharyngeal Nasopharyngeal Swab     Status: None   Collection Time: 07/10/19  6:46 PM   Specimen: Nasopharyngeal Swab  Result Value Ref Range Status   SARS Coronavirus 2 NEGATIVE NEGATIVE Final    Comment: (NOTE) If result is NEGATIVE SARS-CoV-2 target nucleic acids are NOT DETECTED. The SARS-CoV-2 RNA is generally detectable in upper and lower  respiratory specimens during the acute phase of infection. The lowest  concentration of SARS-CoV-2 viral copies this assay can detect is 250  copies / mL. A negative result does not preclude SARS-CoV-2 infection  and should not be used as the sole basis for treatment or other  patient management decisions.  A negative result may occur with  improper specimen collection / handling, submission of specimen other  than nasopharyngeal swab, presence of viral mutation(s) within the  areas targeted by this assay, and inadequate number of viral copies  (<250 copies / mL).  A negative result must be combined with clinical  observations, patient history, and epidemiological information. If result is POSITIVE SARS-CoV-2 target nucleic acids are DETECTED. The SARS-CoV-2 RNA is generally detectable in upper and lower  respiratory specimens dur ing the acute phase of infection.  Positive  results are indicative of active infection with SARS-CoV-2.  Clinical  correlation with patient history and other diagnostic information is  necessary to determine patient infection status.  Positive results do  not rule out bacterial infection or co-infection with other viruses. If result is PRESUMPTIVE POSTIVE SARS-CoV-2 nucleic acids MAY BE PRESENT.   A presumptive positive result was obtained on the  submitted specimen  and confirmed on repeat testing.  While 2019 novel coronavirus  (SARS-CoV-2) nucleic acids may be present in the submitted sample  additional confirmatory testing may be necessary for epidemiological  and / or clinical management purposes  to differentiate between  SARS-CoV-2 and other Sarbecovirus currently known to infect humans.  If clinically indicated additional testing with an alternate test  methodology [phone]) is advised. The SARS-CoV-2 RNA is generally  detectable in upper and lower respiratory sp ecimens during the acute  phase of infection. The expected result is Negative. Fact Sheet for Patients:  StrictlyIdeas.no Fact Sheet for Healthcare Providers: BankingDealers.co.za This test is not yet approved or cleared by the [place] FDA and has been authorized for detection and/or diagnosis of SARS-CoV-2 by FDA under an Emergency Use Authorization (EUA).  This EUA will remain in effect (meaning this test can be used) for the duration of the COVID-19 declaration under Section 564(b)(1) of the Act, 21 U.S.C. section 360bbb-3(b)(1), unless the authorization is terminated or revoked sooner. Performed at [facility], [place] [address] [place], [place] [address]   Culture, sputum-assessment     Status: None   Collection Time: 07/11/19  1:13 PM   Specimen: Sputum  Result Value Ref Range Status   Specimen Description SPU  Final   Special Requests NONE  Final   Sputum evaluation   Final    THIS SPECIMEN IS ACCEPTABLE FOR SPUTUM CULTURE Performed at [facility], [address] [place], [place] [address]    Report Status 07/11/2019 FINAL  Final  Culture, respiratory     Status: None (Preliminary result)   Collection Time: 07/11/19  1:13 PM   Specimen: Sputum  Result Value Ref Range Status   Specimen Description SPU  Final   Special Requests NONE Reflexed from [id number]  Final   Gram Stain   Final     ABUNDANT WBC PRESENT, PREDOMINANTLY PMN NO ORGANISMS SEEN    Culture   Final    NO GROWTH < 24 HOURS Performed at [facility], [place] [address], [place],  [address]    Report Status PENDING  Incomplete     Scheduled Meds: . apixaban  5 mg Oral BID  . atorvastatin  40 mg Oral Daily  . budesonide (PULMICORT) nebulizer solution  0.25 mg Nebulization BID  . cholecalciferol  1,000 Units Oral Daily  . FLUoxetine  20 mg Oral Daily  . insulin aspart  0-5 Units Subcutaneous QHS  . insulin aspart  0-9 Units Subcutaneous TID WC  . insulin detemir  12 Units Subcutaneous QHS  . ipratropium  0.5 mg Nebulization TID  . levalbuterol  1.25 mg Nebulization TID  . levothyroxine  50 mcg Oral Daily  . lidocaine  1 patch Transdermal Q24H  . metoprolol succinate  12.5 mg Oral Daily  .  multivitamin  1 tablet Oral BID  . multivitamins with iron  1 tablet Oral Daily  . omega-3 acid ethyl esters  1,000 mg Oral Daily  . pantoprazole  40 mg Oral Daily  . pramipexole  0.75 mg Oral QHS  . pregabalin  50 mg Oral TID  . traZODone  100 mg Oral QHS   Continuous Infusions: . azithromycin 500 mg (07/11/19 1823)  . piperacillin-tazobactam (ZOSYN)  IV 3.375 g (07/12/19 1242)    Procedures/Studies: Dg Chest 2 View  Result Date: 07/10/2019 CLINICAL DATA:  Shortness of breath. EXAM: CHEST - 2 VIEW COMPARISON:  Radiograph of Feb 27, 2018. CT scan of March 27, 2018 and July 08, 2018. FINDINGS: The heart size and mediastinal contours are within normal limits. No pneumothorax or pleural effusion is noted. There are multiple ill-defined opacities throughout both lungs, some of which were present on prior exam and some of which are new. The visualized skeletal structures are unremarkable. IMPRESSION: Multiple ill-defined opacities are noted throughout both lungs which are consistent with recurrent bronchopneumonia as described on prior CT scan. Aortic Atherosclerosis (ICD10-I70.0). Electronically Signed    By: Marijo Conception M.D.   On: 07/10/2019 15:37    Vernell Leep, MD, FACP, [facility]. [facility]  To contact the attending provider between 7A-7P or the covering provider during after hours 7P-7A, please log into the web site [email or web address] and access using universal [place] password for that web site. If you do not have the password, please call the hospital operator.   LOS: 2 days

## 2019-07-13 ENCOUNTER — Inpatient Hospital Stay (HOSPITAL_COMMUNITY): Payer: Medicare Other

## 2019-07-13 LAB — CBC
HCT: 32.9 % — ABNORMAL LOW (ref 36.0–46.0)
Hemoglobin: 10.6 g/dL — ABNORMAL LOW (ref 12.0–15.0)
MCH: 29.4 pg (ref 26.0–34.0)
MCHC: 32.2 g/dL (ref 30.0–36.0)
MCV: 91.1 fL (ref 80.0–100.0)
Platelets: 261 10*3/uL (ref 150–400)
RBC: 3.61 MIL/uL — ABNORMAL LOW (ref 3.87–5.11)
RDW: 14 % (ref 11.5–15.5)
WBC: 11.7 10*3/uL — ABNORMAL HIGH (ref 4.0–10.5)
nRBC: 0 % (ref 0.0–0.2)

## 2019-07-13 LAB — MAGNESIUM: Magnesium: 1.8 mg/dL (ref 1.7–2.4)

## 2019-07-13 LAB — BASIC METABOLIC PANEL
Anion gap: 9 (ref 5–15)
BUN: 17 mg/dL (ref 8–23)
CO2: 25 mmol/L (ref 22–32)
Calcium: 8 mg/dL — ABNORMAL LOW (ref 8.9–10.3)
Chloride: 101 mmol/L (ref 98–111)
Creatinine, Ser: 1.33 mg/dL — ABNORMAL HIGH (ref 0.44–1.00)
GFR calc Af Amer: 41 mL/min — ABNORMAL LOW (ref >60)
GFR calc non Af Amer: 35 mL/min — ABNORMAL LOW (ref >60)
Glucose, Bld: 96 mg/dL (ref 70–99)
Potassium: 3.9 mmol/L (ref 3.5–5.1)
Sodium: 135 mmol/L (ref 135–145)

## 2019-07-13 LAB — CULTURE, RESPIRATORY W GRAM STAIN: Culture: NORMAL

## 2019-07-13 MED ORDER — LEVALBUTEROL HCL 1.25 MG/0.5ML IN NEBU
1.2500 mg | INHALATION_SOLUTION | Freq: Two times a day (BID) | RESPIRATORY_TRACT | Status: DC
  Administered 2019-07-13 – 2019-07-14 (×2): 1.25 mg via RESPIRATORY_TRACT
  Filled 2019-07-13 (×2): qty 0.5

## 2019-07-13 MED ORDER — IPRATROPIUM BROMIDE 0.02 % IN SOLN
0.5000 mg | Freq: Two times a day (BID) | RESPIRATORY_TRACT | Status: DC
  Administered 2019-07-13 – 2019-07-14 (×2): 0.5 mg via RESPIRATORY_TRACT
  Filled 2019-07-13 (×2): qty 2.5

## 2019-07-13 NOTE — Evaluation (Signed)
Physical Therapy Evaluation Patient Details Name: Melissa [name] MRN: [id number] DOB: September 11, 1928 Today's Date: 07/13/2019   History of Present Illness  Pt adm with acute hypoxic respiratory failure due to bronchopneumonia. PMH - htn, dm, afib, chf, ckd, asthma, arthritis,   Clinical Impression  Pt presents to PT with limited mobility due to weakness, decr balance and poor activity tolerance. Pt will need 24 hour assist at home with mobility. Pt is confident that daughter is able to provide this. If daughter is unable to provide pt would need SNF. On entry pt on 3L of O2 with SpO2 of 100% on telemetry box and Nelcor probe on forehead. Removed O2 and SpO2 remained 100%. During amb on RA pt very dyspneic and tremulous with SpO2 continuing to read 100%. Pt unable to amb further. Turned O2 on to 3L and sat on rollator. Attempted to get SpO2 using different probe and machine and could not get a reading. Pushed pt back to recliner. Used probe on ear and portable monitor to compare to Nelcor forehead and telemetry box readings. Removed O2. After 3-4 minutes SpO2 on ear probe and portable monitor reading 90% with good waveform. Nelcor/telemetry box reading 98%. Nursing switched ear probe to telemetry box for continued monitoring. Suspect pt had decr SpO2 during ambulation.     Follow Up Recommendations Home health PT;Supervision/Assistance - 24 hour    Equipment Recommendations  None recommended by PT    Recommendations for Other Services       Precautions / Restrictions Precautions Precautions: Fall;Other (comment) Precaution Comments: watch SpO2      Mobility  Bed Mobility Overal bed mobility: Needs Assistance Bed Mobility: Supine to Sit     Supine to sit: Min assist     General bed mobility comments: Assist to elevate trunk into sitting  Transfers Overall transfer level: Needs assistance Equipment used: 4-wheeled walker Transfers: Sit to/from Stand Sit to Stand: Min assist         General transfer comment: Assist to bring hips up and for balance  Ambulation/Gait Ambulation/Gait assistance: Min assist Gait Distance (Feet): 15 Feet Assistive device: 4-wheeled walker Gait Pattern/deviations: Step-through pattern;Decreased step length - right;Decreased step length - left;Shuffle Gait velocity: decr Gait velocity interpretation: <1.31 ft/sec, indicative of household ambulator General Gait Details: Assist for balance and support. Pt very tremulous and fatigued quickly. Had to sit on rollator and be rolled back to chair. See assessment for SpO2 info.  Stairs            Wheelchair Mobility    Modified Rankin (Stroke Patients Only)       Balance Overall balance assessment: Needs assistance Sitting-balance support: No upper extremity supported;Feet supported Sitting balance-Leahy Scale: Fair     Standing balance support: Bilateral upper extremity supported Standing balance-Leahy Scale: Poor Standing balance comment: rollator and min guard for static standin                             Pertinent Vitals/Pain Pain Assessment: Faces Faces Pain Scale: Hurts a little bit Pain Location: neck, shoulders, chest Pain Descriptors / Indicators: Sore Pain Intervention(s): Limited activity within patient's tolerance;Monitored during session;Repositioned    Home Living Family/patient expects to be discharged to:: Private residence Living Arrangements: Children Available Help at Discharge: Family;Available 24 hours/day Type of Home: House Home Access: Stairs to enter Entrance Stairs-Rails: Right Entrance Stairs-Number of Steps: 4-5 Home Layout: Two level;Able to live on main level with bedroom/bathroom Home  Equipment: Gilford Rile - 2 wheels;Walker - 4 wheels;Cane - single point;Bedside commode;Shower seat      Prior Function Level of Independence: Needs assistance   Gait / Transfers Assistance Needed: amb in household without assistive device.   ADL's  / Homemaking Assistance Needed: Pt performs bathing, dressing, toileting. Pt doesn't cook or drive.        Hand Dominance   Dominant Hand: Right    Extremity/Trunk Assessment   Upper Extremity Assessment Upper Extremity Assessment: Defer to OT evaluation    Lower Extremity Assessment Lower Extremity Assessment: Generalized weakness       Communication   Communication: HOH  Cognition Arousal/Alertness: Awake/alert Behavior During Therapy: WFL for tasks assessed/performed Overall Cognitive Status: Within Functional Limits for tasks assessed                                        General Comments      Exercises     Assessment/Plan    PT Assessment Patient needs continued PT services  PT Problem List Decreased strength;Decreased activity tolerance;Decreased balance;Decreased mobility;Decreased knowledge of precautions;Cardiopulmonary status limiting activity       PT Treatment Interventions DME instruction;Gait training;Functional mobility training;Therapeutic activities;Stair training;Therapeutic exercise;Balance training;Patient/family education    PT Goals (Current goals can be found in the Care Plan section)  Acute Rehab PT Goals Patient Stated Goal: return home PT Goal Formulation: With patient Time For Goal Achievement: 07/27/19 Potential to Achieve Goals: Good    Frequency Min 3X/week   Barriers to discharge Inaccessible home environment stairs to enter    Co-evaluation               AM-PAC PT "6 Clicks" Mobility  Outcome Measure Help needed turning from your back to your side while in a flat bed without using bedrails?: A Little Help needed moving from lying on your back to sitting on the side of a flat bed without using bedrails?: A Little Help needed moving to and from a bed to a chair (including a wheelchair)?: A Little Help needed standing up from a chair using your arms (e.g., wheelchair or bedside chair)?: A Little Help  needed to walk in hospital room?: A Little Help needed climbing 3-5 steps with a railing? : A Lot 6 Click Score: 17    End of Session Equipment Utilized During Treatment: Gait belt;Oxygen Activity Tolerance: Patient limited by fatigue Patient left: in chair;with call bell/phone within reach;with chair alarm set Nurse Communication: Mobility status;Other (comment)(suspect SpO2 reading from Nelcor forehead monitor) PT Visit Diagnosis: Unsteadiness on feet (R26.81);Other abnormalities of gait and mobility (R26.89);Muscle weakness (generalized) (M62.81)    Time: 0960-4540 PT Time Calculation (min) (ACUTE ONLY): 56 min   Charges:   PT Evaluation $PT Eval Moderate Complexity: 1 Mod PT Treatments $Gait Training: 23-37 mins        [place] Pager [phone] Office [place] 07/13/2019, 11:27 AM

## 2019-07-13 NOTE — Care Management Important Message (Signed)
Important Message  Patient Details  Name: Melissa [name] MRN: [id number] Date of Birth: 16-Mar-1928   Medicare Important Message Given:  Yes     Orbie Pyo 07/13/2019, 2:08 PM

## 2019-07-13 NOTE — Progress Notes (Signed)
PROGRESS NOTE  LAMEKA DISLA WUX:[id number] DOB: Oct 09, 1928 DOA: 07/10/2019 PCP: [name], MD  Brief History:  85 y.o. female with medical history significant for chronic systolic CHF (25%), DM2, HTN, Afib on Eliquis, and HLD who presented to the ED with c/o cough for the past 2 weeks. Pt has a h/o recurrent pneumonia and chronic cough and typically has recurrences about this time every year. She is followed by Dr. [name] in [place] [facility] and was last seen there in July 2019. High res CT in Sept 2019 showed bronchiectatic changes and scarring, with concern for possible MAI vs COP vs recurrent aspiration. She had been doing fairly well since then except for chronic dry cough. 2 weeks PTA cough began to worsen, and became productive of thick mucous. She has had no fevers. She was seen in clinic (in the parking lot) on Tuesday of week PTA and started on a Z pack. Had COVID test on Wed at urgent care which was negative. Despite azithromycin she worsened and was coughing so badly that she was having frequent post-tussive emesis.  Because of worsening cough and shortness of breath, the patient's daughter brought her to the emergency department for further evaluation. She was admitted for Acute respiratory failure d/t bronchopneumonia complicating underlying bronchiectasis..  Assessment/Plan: Acute respiratory failure with hypoxia/bronchopneumonia/bronchiectasis -Secondary to pneumonia complicating underlying bronchiectasis.  Covid negative.  Chest x-ray shows bilateral multiple patchy ill-defined opacities.  Improving slowly.  Sputum culture negative.  MBS on 05/19/2018 showed mild aspiration risk.  Procalcitonin only 0.16.  Currently on 2 L of nasal oxygen.  Continue IV Zosyn and Zithromax.  Continue Xopenex, Atrovent and Pulmicort. -Presently saturating 100% on 4L/min [place] Oxygen> wean as able for sats > 92%. - Covid testing negative.  She was assessed by PT and they recommend home health care.   Paroxysmal atrial fibrillation -Rate controlled.  Continue Eliquis and metoprolol.  Diabetes mellitus type 2 -05/05/2019 hemoglobin A1c 6.1.  Blood sugar very well controlled.  Continue 12 units of Levemir along with SSI.  CKD stage III -Baseline creatinine 1.2-1.5.  Currently at baseline.  Continue to watch.  Chronic systolic CHF -3/66/4403 echo EF 15-20%, trivial MR - Trace ankle edema otherwise appears euvolemic. -Continue home dose of furosemide -Continue metoprolol succinate - Sees [name], Cardiology as OP.  Hyperlipidemia -Continue statin and Lovaza.  Hypothyroidism -Continue Synthroid -July 2020 TSH 1.72  Chronic back pain - Lidocaine patch as per reported home regimen.  Hypomagnesemia - Replace IV and f/u Mg in am.  Anemia, suspect chronic disease - Follow CBC's.  Disposition Plan:  DC home pending clinical improvement, hopefully tomorrow Family Communication: None present at bedside.  Will call him later.  Consultants:  none  Code Status:  FULL  DVT Prophylaxis:  apixaban  Procedures: None  Antibiotics: Zosyn 9/27>>>  Subjective: Patient seen and examined.  She still had some shortness of breath.  She was on 3 L of nasal oxygen when I saw her.  PT was in the room and was trying to get her up and she was quite dyspneic just doing that.  Objective: Vitals:   07/12/19 2206 07/13/19 0004 07/13/19 0731 07/13/19 0753  BP: (!) 117/45 (!) 92/48 121/63   Pulse: (!) 102 84 100 98  Resp: 16 17 18 18   Temp: 98.2 F (36.8 C) 98.1 F (36.7 C) 97.7 F (36.5 C)   TempSrc: Oral Oral Oral   SpO2: 100% 100% 97% 100%  Weight:      Height:       No intake or output data in the 24 hours ending 07/13/19 1308 Weight change:  Exam:  General exam: Appears calm and comfortable but dyspneic Respiratory system: Diminished breath sounds with scattered rhonchi bilaterally. Cardiovascular system: S1 & S2 heard, irregularly regular rate and rhythm. No JVD, murmurs,  rubs, gallops or clicks.  Trace pitting edema bilateral lower extremity. Gastrointestinal system: Abdomen is nondistended, soft and nontender. No organomegaly or masses felt. Normal bowel sounds heard. Central nervous system: Alert and oriented. No focal neurological deficits. Extremities: Symmetric 5 x 5 power. Skin: No rashes, lesions or ulcers.  Psychiatry: Judgement and insight appear poor.  Mood & affect flat..    Data Reviewed: I have personally reviewed following labs and imaging studies Basic Metabolic Panel: Recent Labs  Lab 07/10/19 1503 07/11/19 0615 07/12/19 0439 07/13/19 0544  NA 136 138 138 135  K 3.5 3.8 3.9 3.9  CL 99 100 103 101  CO2 26 28 27 25   GLUCOSE 188* 91 113* 96  BUN 23 22 18 17   CREATININE 1.55* 1.44* 1.38* 1.33*  CALCIUM 8.4* 8.0* 7.5* 8.0*  MG  --   --  1.2* 1.8   Liver Function Tests: Recent Labs  Lab 07/10/19 1503  AST 19  ALT 14  ALKPHOS 89  BILITOT 0.5  PROT 7.0  ALBUMIN 2.7*   CBC: Recent Labs  Lab 07/10/19 1503 07/11/19 0615 07/12/19 0439 07/13/19 0544  WBC 14.1* 14.4* 11.7* 11.7*  NEUTROABS 11.1*  --   --   --   HGB 12.3 11.0* 10.0* 10.6*  HCT 36.7 33.1* 30.6* 32.9*  MCV 87.6 89.7 90.5 91.1  PLT 250 232 211 261   Cardiac Enzymes: No results for input(s): CKTOTAL, CKMB, CKMBINDEX, TROPONINI in the last 168 hours. BNP: Invalid input(s): POCBNP CBG: Recent Labs  Lab 07/11/19 1225 07/11/19 1645 07/11/19 2126 07/11/19 2152 07/12/19 1223  GLUCAP 124* 148* 150* 153* 145*     Recent Results (from the past 240 hour(s))  SARS Coronavirus 2 [facility] order, Performed in [facility] hospital lab) Nasopharyngeal Nasopharyngeal Swab     Status: None   Collection Time: 07/10/19  6:46 PM   Specimen: Nasopharyngeal Swab  Result Value Ref Range Status   SARS Coronavirus 2 NEGATIVE NEGATIVE Final    Comment: (NOTE) If result is NEGATIVE SARS-CoV-2 target nucleic acids are NOT DETECTED. The SARS-CoV-2 RNA is generally  detectable in upper and lower  respiratory specimens during the acute phase of infection. The lowest  concentration of SARS-CoV-2 viral copies this assay can detect is 250  copies / mL. A negative result does not preclude SARS-CoV-2 infection  and should not be used as the sole basis for treatment or other  patient management decisions.  A negative result may occur with  improper specimen collection / handling, submission of specimen other  than nasopharyngeal swab, presence of viral mutation(s) within the  areas targeted by this assay, and inadequate number of viral copies  (<250 copies / mL). A negative result must be combined with clinical  observations, patient history, and epidemiological information. If result is POSITIVE SARS-CoV-2 target nucleic acids are DETECTED. The SARS-CoV-2 RNA is generally detectable in upper and lower  respiratory specimens dur ing the acute phase of infection.  Positive  results are indicative of active infection with SARS-CoV-2.  Clinical  correlation with patient history and other diagnostic information is  necessary to determine patient infection status.  Positive  results do  not rule out bacterial infection or co-infection with other viruses. If result is PRESUMPTIVE POSTIVE SARS-CoV-2 nucleic acids MAY BE PRESENT.   A presumptive positive result was obtained on the submitted specimen  and confirmed on repeat testing.  While 2019 novel coronavirus  (SARS-CoV-2) nucleic acids may be present in the submitted sample  additional confirmatory testing may be necessary for epidemiological  and / or clinical management purposes  to differentiate between  SARS-CoV-2 and other Sarbecovirus currently known to infect humans.  If clinically indicated additional testing with an alternate test  methodology [phone]) is advised. The SARS-CoV-2 RNA is generally  detectable in upper and lower respiratory sp ecimens during the acute  phase of infection. The  expected result is Negative. Fact Sheet for Patients:  StrictlyIdeas.no Fact Sheet for Healthcare Providers: BankingDealers.co.za This test is not yet approved or cleared by the [place] FDA and has been authorized for detection and/or diagnosis of SARS-CoV-2 by FDA under an Emergency Use Authorization (EUA).  This EUA will remain in effect (meaning this test can be used) for the duration of the COVID-19 declaration under Section 564(b)(1) of the Act, 21 U.S.C. section 360bbb-3(b)(1), unless the authorization is terminated or revoked sooner. Performed at [facility], [place] [address], [place], [place] [address]   Culture, sputum-assessment     Status: None   Collection Time: 07/11/19  1:13 PM   Specimen: Sputum  Result Value Ref Range Status   Specimen Description SPU  Final   Special Requests NONE  Final   Sputum evaluation   Final    THIS SPECIMEN IS ACCEPTABLE FOR SPUTUM CULTURE Performed at [facility], [address] [place], [place] [address]    Report Status 07/11/2019 FINAL  Final  Culture, respiratory     Status: None   Collection Time: 07/11/19  1:13 PM   Specimen: Sputum  Result Value Ref Range Status   Specimen Description SPU  Final   Special Requests NONE Reflexed from [id number]  Final   Gram Stain   Final    ABUNDANT WBC PRESENT, PREDOMINANTLY PMN NO ORGANISMS SEEN    Culture   Final    Consistent with normal respiratory flora. Performed at [facility], [place] [address], [place],  [address]    Report Status 07/13/2019 FINAL  Final     Scheduled Meds: . apixaban  5 mg Oral BID  . atorvastatin  40 mg Oral Daily  . budesonide (PULMICORT) nebulizer solution  0.25 mg Nebulization BID  . cholecalciferol  1,000 Units Oral Daily  . FLUoxetine  20 mg Oral Daily  . insulin aspart  0-5 Units Subcutaneous QHS  . insulin aspart  0-9 Units Subcutaneous TID WC  . insulin detemir  12 Units  Subcutaneous QHS  . ipratropium  0.5 mg Nebulization BID  . levalbuterol  1.25 mg Nebulization BID  . levothyroxine  50 mcg Oral Daily  . lidocaine  1 patch Transdermal Q24H  . metoprolol succinate  12.5 mg Oral Daily  . multivitamin  1 tablet Oral BID  . multivitamins with iron  1 tablet Oral Daily  . omega-3 acid ethyl esters  1,000 mg Oral Daily  . pantoprazole  40 mg Oral Daily  . pramipexole  0.75 mg Oral QHS  . pregabalin  50 mg Oral TID  . traZODone  100 mg Oral QHS   Continuous Infusions: . azithromycin 500 mg (07/12/19 1924)  . piperacillin-tazobactam (ZOSYN)  IV 3.375  g (07/13/19 1049)    Procedures/Studies: Dg Chest 2 View  Result Date: 07/10/2019 CLINICAL DATA:  Shortness of breath. EXAM: CHEST - 2 VIEW COMPARISON:  Radiograph of Feb 27, 2018. CT scan of March 27, 2018 and July 08, 2018. FINDINGS: The heart size and mediastinal contours are within normal limits. No pneumothorax or pleural effusion is noted. There are multiple ill-defined opacities throughout both lungs, some of which were present on prior exam and some of which are new. The visualized skeletal structures are unremarkable. IMPRESSION: Multiple ill-defined opacities are noted throughout both lungs which are consistent with recurrent bronchopneumonia as described on prior CT scan. Aortic Atherosclerosis (ICD10-I70.0). Electronically Signed   By: Marijo Conception M.D.   On: 07/10/2019 15:37    Darliss Cheney, MD [facility]  To contact the attending provider between 7A-7P or the covering provider during after hours 7P-7A, please log into the web site [email or web address] and access using universal B and E password for that web site. If you do not have the password, please call the hospital operator.   LOS: 3 days

## 2019-07-14 DIAGNOSIS — J180 Bronchopneumonia, unspecified organism: Secondary | ICD-10-CM

## 2019-07-14 LAB — CBC WITH DIFFERENTIAL/PLATELET
Abs Immature Granulocytes: 0 10*3/uL (ref 0.00–0.07)
Basophils Absolute: 0.2 10*3/uL — ABNORMAL HIGH (ref 0.0–0.1)
Basophils Relative: 2 %
Eosinophils Absolute: 0.6 10*3/uL — ABNORMAL HIGH (ref 0.0–0.5)
Eosinophils Relative: 5 %
HCT: 33 % — ABNORMAL LOW (ref 36.0–46.0)
Hemoglobin: 10.7 g/dL — ABNORMAL LOW (ref 12.0–15.0)
Lymphocytes Relative: 7 %
Lymphs Abs: 0.8 10*3/uL (ref 0.7–4.0)
MCH: 29.6 pg (ref 26.0–34.0)
MCHC: 32.4 g/dL (ref 30.0–36.0)
MCV: 91.2 fL (ref 80.0–100.0)
Monocytes Absolute: 0.5 10*3/uL (ref 0.1–1.0)
Monocytes Relative: 4 %
Neutro Abs: 9.4 10*3/uL — ABNORMAL HIGH (ref 1.7–7.7)
Neutrophils Relative %: 82 %
Platelets: 281 10*3/uL (ref 150–400)
RBC: 3.62 MIL/uL — ABNORMAL LOW (ref 3.87–5.11)
RDW: 14 % (ref 11.5–15.5)
WBC: 11.5 10*3/uL — ABNORMAL HIGH (ref 4.0–10.5)
nRBC: 0.2 % (ref 0.0–0.2)
nRBC: 1 /100 WBC — ABNORMAL HIGH

## 2019-07-14 LAB — BASIC METABOLIC PANEL
Anion gap: 9 (ref 5–15)
BUN: 10 mg/dL (ref 8–23)
CO2: 27 mmol/L (ref 22–32)
Calcium: 8.1 mg/dL — ABNORMAL LOW (ref 8.9–10.3)
Chloride: 99 mmol/L (ref 98–111)
Creatinine, Ser: 1.23 mg/dL — ABNORMAL HIGH (ref 0.44–1.00)
GFR calc Af Amer: 45 mL/min — ABNORMAL LOW (ref >60)
GFR calc non Af Amer: 39 mL/min — ABNORMAL LOW (ref >60)
Glucose, Bld: 114 mg/dL — ABNORMAL HIGH (ref 70–99)
Potassium: 4.3 mmol/L (ref 3.5–5.1)
Sodium: 135 mmol/L (ref 135–145)

## 2019-07-14 LAB — PROCALCITONIN: Procalcitonin: <0.1 ng/mL

## 2019-07-14 LAB — GLUCOSE, CAPILLARY
Glucose-Capillary: 93 mg/dL (ref 70–99)
Glucose-Capillary: 171 mg/dL — ABNORMAL HIGH (ref 70–99)
Glucose-Capillary: 167 mg/dL — ABNORMAL HIGH (ref 70–99)
Glucose-Capillary: 92 mg/dL (ref 70–99)
Glucose-Capillary: 151 mg/dL — ABNORMAL HIGH (ref 70–99)
Glucose-Capillary: 148 mg/dL — ABNORMAL HIGH (ref 70–99)
Glucose-Capillary: 120 mg/dL — ABNORMAL HIGH (ref 70–99)
Glucose-Capillary: 96 mg/dL (ref 70–99)

## 2019-07-14 MED ORDER — GUAIFENESIN 100 MG/5ML PO SOLN
5.0000 mL | ORAL | Status: DC | PRN
  Administered 2019-07-14 – 2019-07-22 (×5): 100 mg via ORAL
  Filled 2019-07-14 (×5): qty 5

## 2019-07-14 MED ORDER — FUROSEMIDE 20 MG PO TABS
20.0000 mg | ORAL_TABLET | Freq: Every day | ORAL | Status: DC
  Administered 2019-07-15 – 2019-07-19 (×5): 20 mg via ORAL
  Filled 2019-07-14 (×5): qty 1

## 2019-07-14 MED ORDER — INFLUENZA VAC A&B SA ADJ QUAD 0.5 ML IM PRSY
0.5000 mL | PREFILLED_SYRINGE | INTRAMUSCULAR | Status: DC
  Filled 2019-07-14: qty 0.5

## 2019-07-14 MED ORDER — LEVALBUTEROL HCL 1.25 MG/0.5ML IN NEBU
1.2500 mg | INHALATION_SOLUTION | Freq: Four times a day (QID) | RESPIRATORY_TRACT | Status: DC
  Administered 2019-07-14 – 2019-07-15 (×3): 1.25 mg via RESPIRATORY_TRACT
  Filled 2019-07-14 (×3): qty 0.5

## 2019-07-14 MED ORDER — IPRATROPIUM BROMIDE 0.02 % IN SOLN
0.5000 mg | Freq: Four times a day (QID) | RESPIRATORY_TRACT | Status: DC
  Administered 2019-07-14 – 2019-07-15 (×3): 0.5 mg via RESPIRATORY_TRACT
  Filled 2019-07-14 (×3): qty 2.5

## 2019-07-14 MED ORDER — AMOXICILLIN-POT CLAVULANATE 875-125 MG PO TABS
1.0000 | ORAL_TABLET | Freq: Two times a day (BID) | ORAL | Status: DC
  Administered 2019-07-14 – 2019-07-15 (×2): 1 via ORAL
  Filled 2019-07-14 (×2): qty 1

## 2019-07-14 MED ORDER — FUROSEMIDE 10 MG/ML IJ SOLN
40.0000 mg | Freq: Once | INTRAMUSCULAR | Status: AC
  Administered 2019-07-14: 40 mg via INTRAVENOUS
  Filled 2019-07-14: qty 4

## 2019-07-14 MED ORDER — ONDANSETRON HCL 4 MG/2ML IJ SOLN
4.0000 mg | Freq: Four times a day (QID) | INTRAMUSCULAR | Status: DC | PRN
  Administered 2019-07-14 – 2019-07-26 (×5): 4 mg via INTRAVENOUS
  Filled 2019-07-14 (×5): qty 2

## 2019-07-14 NOTE — Progress Notes (Signed)
SATURATION QUALIFICATIONS: (This note is used to comply with regulatory documentation for home oxygen)  Patient Saturations on Room Air at Rest = 95%  Patient Saturations on Room Air while Ambulating = 80%  Patient Saturations on 3 Liters of oxygen while Ambulating = 96%  Please briefly explain why patient needs home oxygen: Patient did not c/o of shortness of breath, but was not able to walk long distance.

## 2019-07-14 NOTE — Consult Note (Addendum)
NAME:  Melissa [name], MRN:  [id number], DOB:  11/16/1927, LOS: 4 ADMISSION DATE:  07/10/2019, CONSULTATION DATE:   REFERRING MD: Doristine Bosworth , CHIEF COMPLAINT: New hypoxic respiratory failure  Brief History   85 year old female patient with multiple medical comorbidities most importantly including history of bronchiectasis (felt either NTM or aspiration related), mild obstructive lung disease with FEV1 69% with 5% improvement following bronchodilators and systolic cardiomyopathy with EF 20%.  Admitted 9/26 with new hypoxic respiratory failure and working diagnosis of community-acquired pneumonia versus aspiration pulmonary asked to see on 9/30 for ongoing supplemental oxygen needs.  History of present illness    85 year old female patient with multiple medical comorbidities who presented to the emergency room on 9/26 with chief complaint of acute on chronic cough (over the course of approximately 2 weeks) with associated posttussis emesis, and worsening shortness of breath.  Upon presentation denied chest pain, loss of taste or smell, GI symptoms other than above-mentioned, lower extremity edema, wheezing, or sick exposure.  Had been treated in the outpatient setting for this cough with a azithromycin because symptoms progressed she decided to seek evaluation at the emergency room.  In the emergency room she was found to have mild leukocytosis and room air hypoxia with pulse oximetry in the low 80s she was placed on supplemental oxygen broad-spectrum community-acquired coverage was initiated for antibiotics and she was admitted to the medical floor.  Hospital course: 9/27 feeling a little better but still short of breath with activity still having cough 9/28: Antibiotics broadened, placed on Zosyn instead of Rocephin to cover for possible aspiration 9/29: Still short of breath down to 3 L nasal cannula working with physical therapy, 9/30: Pulmonary consulted as patient still oxygen dependent. Past Medical  History  Recurrent pneumonia, chronic cough, bronchiectasis felt possibly either secondary to MAI or chronic aspiration, dysphasia is identified by modified barium swallow August 2019, mild obstructive disease with FEV1 69%, improved to 74% with bronchodilators by spirometry September 2016, and chronic systolic heart failure with ejection fraction of 20%, hypertension, atrial fibrillation on Eliquis, diabetes  Significant Hospital Events   9/26 admitted. Started on oxygen azith and rocephin 9/27 feeling a little better but still short of breath with activity still having cough 9/28: Antibiotics broadened, placed on Zosyn instead of Rocephin to cover for possible aspiration 9/29: Still short of breath down to 3 L nasal cannula working with physical therapy, 9/30: Pulmonary consulted as patient still oxygen dependent.  Consults:  Pulmonary consult on 9/30  Procedures:    Significant Diagnostic Tests:    Micro Data:  Covid 19 9/26: neg Sputum culture 9/27: Normal flora Respiratory viral panel 9/30: Urine strep antigen 9/30 Urine Legionella antigen 9/30 Antimicrobials:  azithromycin 9/26>>> Rocephin 9/26 through 9/27 Zosyn 9/27>>>  Interim history/subjective:  Breathing easier, cough is substantially improved  Objective   Blood pressure (Abnormal) 141/46, pulse (Abnormal) 108, temperature 98.5 F (36.9 C), temperature source Oral, resp. rate (Abnormal) 21, height 5\' 5"  (1.651 m), weight 68.2 kg, SpO2 93 %.       No intake or output data in the 24 hours ending 07/14/19 1343 Filed Weights   07/11/19 1415  Weight: 68.2 kg    Examination: General: Frail 85 year old white female she is currently sitting up in the chair she is emotional from recent loss of son-in-law but not in acute distress from a breathing standpoint HENT: Normocephalic atraumatic no jugular venous distention appreciated in the sitting position mucous membranes are moist sclera nonicteric Lungs: Posterior rales  appreciated bilaterally no accessory use Cardiovascular: Regular irregular no murmur appreciated Abdomen: Soft not tender no organomegaly Extremities: Trace dependent edema bilateral ankles Neuro: Awake oriented no focal deficits GU: Voids spontaneously  Resolved Hospital Problem list     Assessment & Plan:   Acute on chronic hypoxic respiratory failure CAP (NOS) bronchiectasis  Dysphagia  Obstructive lung disease w/ BD response  Systolic CM (EF 67%) PAF (on BB and eliquis)  CKD stage III DM type II HL Hypothyroidism Chronic back pain  Anemia of chronic disease Fluid and electrolyte imbalance  Recent death in family.     Acute on chronic hypoxic respiratory failure in the setting of bilateral patchy pulmonary infiltrates with known history of bronchiectasis and mild obstructive lung disease w/ + BD response  -Differential diagnosis includes: Atypical infection, viral pneumonitis, also consider element of pulmonary edema -Portable chest x-ray personally reviewed from 9/29: Demonstrates bilateral patchy pulmonary infiltrates, accounting for rotation appears to have element of left pleural effusion -Clinically has improved with current antimicrobial therapy -Weaning supplemental oxygen  Plan/recommendation Check respiratory virus panel Check urine strep and Legionella antigen Check sputum for AFB Now day #5 a azithromycin and #4 Zosyn; can change to augmentin and complete 3 more days She is day # 9 azith. (will speak to Dr Loanne Drilling but would like sputum to be AFB positive if we are going to consider empiric MAI rx so can probably stop) Continue pulse oximetry and wean oxygen will need walking oximetry prior to discharge Add scheduled bronchodilators and inhaled corticosteroid Add flutter valve We will resume her home furosemide in the morning, today we will give a one-time dose of intravenous furosemide Am cxr  Defer FOB for now    Systolic CM (EF 59%) w/ h/o  PAF -currently NSR w/ last QTc 488 Plan Cont current BB Add back lasix  Tele  Cont DOAC   All other issues per IM  Labs   CBC: Recent Labs  Lab 07/10/19 1503 07/11/19 0615 07/12/19 0439 07/13/19 0544 07/14/19 0543  WBC 14.1* 14.4* 11.7* 11.7* 11.5*  NEUTROABS 11.1*  --   --   --  9.4*  HGB 12.3 11.0* 10.0* 10.6* 10.7*  HCT 36.7 33.1* 30.6* 32.9* 33.0*  MCV 87.6 89.7 90.5 91.1 91.2  PLT 250 232 211 261 163    Basic Metabolic Panel: Recent Labs  Lab 07/10/19 1503 07/11/19 0615 07/12/19 0439 07/13/19 0544 07/14/19 0543  NA 136 138 138 135 135  K 3.5 3.8 3.9 3.9 4.3  CL 99 100 103 101 99  CO2 26 28 27 25 27   GLUCOSE 188* 91 113* 96 114*  BUN 23 22 18 17 10   CREATININE 1.55* 1.44* 1.38* 1.33* 1.23*  CALCIUM 8.4* 8.0* 7.5* 8.0* 8.1*  MG  --   --  1.2* 1.8  --    GFR: Estimated Creatinine Clearance: 27.9 mL/min (A) (by C-G formula based on SCr of 1.23 mg/dL (H)). Recent Labs  Lab 07/11/19 0615 07/12/19 0439 07/13/19 0544 07/14/19 0543 07/14/19 0811  PROCALCITON  --  0.16  --   --  <0.10  WBC 14.4* 11.7* 11.7* 11.5*  --     Liver Function Tests: Recent Labs  Lab 07/10/19 1503  AST 19  ALT 14  ALKPHOS 89  BILITOT 0.5  PROT 7.0  ALBUMIN 2.7*   No results for input(s): LIPASE, AMYLASE in the last 168 hours. No results for input(s): AMMONIA in the last 168 hours.  ABG    Component Value Date/Time  TCO2 25 11/14/2015 1603     Coagulation Profile: No results for input(s): INR, PROTIME in the last 168 hours.  Cardiac Enzymes: No results for input(s): CKTOTAL, CKMB, CKMBINDEX, TROPONINI in the last 168 hours.  HbA1C: Hemoglobin A1C  Date/Time Value Ref Range Status  05/05/2019 04:48 PM 6.1 (A) 4.0 - 5.6 % Final   Hgb A1c MFr Bld  Date/Time Value Ref Range Status  11/30/2018 11:46 AM 7.7 (H) 4.6 - 6.5 % Final    Comment:    Glycemic Control Guidelines for People with Diabetes:Non Diabetic:  <6%Goal of Therapy: <7%Additional Action  Suggested:  >8%   07/17/2018 10:15 AM 6.6 (H) 4.6 - 6.5 % Final    Comment:    Glycemic Control Guidelines for People with Diabetes:Non Diabetic:  <6%Goal of Therapy: <7%Additional Action Suggested:  >8%     CBG: Recent Labs  Lab 07/13/19 0724 07/13/19 1123 07/13/19 1626 07/13/19 2042 07/14/19 0805  GLUCAP 92 151* 148* 120* 96    Review of Systems:   Review of Systems  Constitutional: Positive for malaise/fatigue. Negative for fever and weight loss.  HENT: Positive for congestion, ear pain, hearing loss, nosebleeds, sinus pain and sore throat.   Eyes: Negative.   Respiratory: Positive for cough, sputum production, shortness of breath and wheezing. Negative for hemoptysis.   Cardiovascular: Positive for chest pain, orthopnea and leg swelling.  Gastrointestinal: Positive for vomiting.  Genitourinary: Negative.   Musculoskeletal: Positive for back pain.  Skin: Negative.   Neurological: Negative.   Endo/Heme/Allergies: Negative.   Psychiatric/Behavioral: Negative.      Past Medical History  She,  has a past medical history of Anemia, Anxiety, Arthritis, Asthma, Chronic systolic CHF (congestive heart failure) ([place]), CKD (chronic kidney disease), stage III ([place]), Diabetic peripheral neuropathy associated with type 2 diabetes mellitus ([place]) (08/22/2015), Dyspnea, Dysrhythmia, GERD (gastroesophageal reflux disease), GI bleed due to NSAIDs (1990s), Head injury, closed, with brief LOC ([place]) (2010), Heart murmur, Hiatal hernia, High cholesterol, History of blood transfusion (1990s), Hypertension, Migraine, NICM (nonischemic cardiomyopathy) ([place]), Orthostatic hypotension, Paresthesia (08/22/2015), Paroxysmal atrial fibrillation ([place]), Pneumonia ("several times; maybe 3 times" (01/24/2016)), RLS (restless legs syndrome) (08/05/2017), Stroke ([place]), Tremor, essential (08/22/2015), Type II diabetes mellitus ([place]), and Unspecified hypothyroidism (06/15/2013).   Surgical History    Past Surgical  History:  Procedure Laterality Date   ABDOMINAL HYSTERECTOMY     APPENDECTOMY     BREAST SURGERY Left    "leaky nipple"   CATARACT EXTRACTION W/ INTRAOCULAR LENS  IMPLANT, BILATERAL Bilateral    CHOLECYSTECTOMY N/A 01/24/2016   Procedure: LAPAROSCOPIC CHOLECYSTECTOMY;  Surgeon: Coralie Keens, MD;  Location: [facility];  Service: General;  Laterality: N/A;   COLONOSCOPY     DILATION AND CURETTAGE OF UTERUS     LAPAROSCOPIC CHOLECYSTECTOMY  01/24/2016   MULTIPLE TOOTH EXTRACTIONS     ORIF HUMERUS FRACTURE Right 06/05/2017   Procedure: OPEN REDUCTION INTERNAL FIXATION (ORIF) PROXIMAL HUMERUS FRACTURE;  Surgeon: Nicholes Stairs, MD;  Location: [facility];  Service: Orthopedics;  Laterality: Right;   TONSILLECTOMY       Social History   reports that she quit smoking about 57 years ago. She has a 20.00 pack-year smoking history. She has never used smokeless tobacco. She reports that she does not drink alcohol or use drugs.   Family History   Her family history includes Cancer in her father; Heart attack in her mother; Heart disease in her father; Stroke in her mother.   Allergies No Known Allergies   Home  Medications  Prior to Admission medications   Medication Sig Start Date End Date Taking? Authorizing Provider  acetaminophen (TYLENOL) 500 MG tablet Take 1,000 mg by mouth every 6 (six) hours as needed for mild pain. Reported on 11/02/2015   Yes [provider]  atorvastatin (LIPITOR) 40 MG tablet Take 40 mg by mouth daily. 04/25/16  Yes [provider]  azithromycin (ZITHROMAX) 250 MG tablet Take 250-500 mg by mouth See admin instructions. Take 2 tablets (500 mg) by mouth every morning, then take 1 tablet (250 mg) daily on days 2-5 07/06/19  Yes [provider]  Cephalexin 250 MG tablet Take 250 mg by mouth daily. 05/07/19  Yes [provider]  Cholecalciferol (VITAMIN D3) 1000 UNITS CAPS Take 1,000 Units by mouth daily.    Yes [provider]  ELIQUIS 5 MG TABS tablet TAKE 1 TABLET TWICE A DAY (DOSE INCREASE) Patient taking differently: Take 5 mg by mouth 2 (two) times daily.  07/09/19  Yes [name], MD  Fe Fum-FePoly-Vit C-Vit B3 (INTEGRA) 62.5-62.5-40-3 MG CAPS Take 1 capsule by mouth daily. 04/16/15  Yes [provider]  FLUoxetine (PROZAC) 20 MG capsule Take 20 mg by mouth daily.  12/24/12  Yes [provider]  furosemide (LASIX) 20 MG tablet Take 1 tablet (20 mg total) by mouth daily. Take daily at lunchtime. Patient taking differently: Take 20 mg by mouth every other day.  10/23/16  Yes Eugenie Filler, MD  guaifenesin (TUSSIN) 100 MG/5ML syrup Take 300 mg by mouth 3 (three) times daily as needed for cough.   Yes [provider]  levalbuterol (XOPENEX) 1.25 MG/3ML nebulizer solution Take 1.25 mg by nebulization every 4 (four) hours as needed for wheezing or shortness of breath.  07/08/19  Yes [provider]  LEVEMIR FLEXTOUCH 100 UNIT/ML Pen INJECT 34 UNITS            SUBCUTANEOUSLY AT BEDTIME Patient taking differently: Inject 20 Units into the skin daily before breakfast.  07/02/17  Yes [name], MD  levothyroxine (SYNTHROID, LEVOTHROID) 50 MCG tablet TAKE 1 TABLET BY MOUTH EVERY DAY Patient taking differently: Take 50 mcg by mouth daily before breakfast.  06/23/13  Yes [name], MD  metoprolol succinate (TOPROL-XL) 25 MG 24 hr tablet TAKE ONE AND ONE-HALF TABLETS DAILY Patient taking differently: Take 37.5 mg by mouth daily with lunch.  07/09/19  Yes [name], MD  Multiple Vitamins-Minerals (ICAPS AREDS 2) CAPS Take 1 capsule by mouth 2 (two) times daily.   Yes [provider]  Omega-3 Fatty Acids (FISH OIL) 1000 MG CAPS Take 1,000 mg by mouth daily.   Yes [provider]  pantoprazole (PROTONIX) 40 MG tablet Take 40 mg by mouth daily after lunch.  09/24/16  Yes [provider]  pramipexole (MIRAPEX) 1 MG tablet Take 1 tablet (1 mg  total) by mouth at bedtime. 02/22/19  Yes Kathrynn Ducking, MD  pregabalin (LYRICA) 50 MG capsule Take 1 capsule (50 mg total) by mouth 3 (three) times daily. 05/08/18  Yes [name], MD  Semaglutide,0.25 or 0.5MG /DOS, (OZEMPIC, 0.25 OR 0.5 MG/DOSE,) 2 MG/1.5ML SOPN Inject 0.25 mg into the skin once a week. Inject 0.25mg  under the skin once weekly for 4 weeks and then increase to 0.5mg  once weekly. Patient taking differently: Inject 0.5 mg into the skin every Sunday.  04/21/19  Yes [name], MD  traZODone (DESYREL) 100 MG tablet Take 100 mg by mouth at bedtime.  Yes [provider]  Insulin Pen Needle (BD PEN NEEDLE NANO U/F) 32G X 4 MM MISC Use 5 per day to inject insulin and victoza 07/23/17   [name], MD  levalbuterol Penne Lash) 1.25 MG/0.5ML nebulizer solution Take 1.25 mg by nebulization every 4 (four) hours as needed for wheezing or shortness of breath. Use 2 times daily x 5 days, then every 4 hours as needed. Patient not taking: Reported on 07/10/2019 10/23/16   Eugenie Filler, MD  ondansetron (ZOFRAN) 4 MG tablet Take 1 tablet (4 mg total) by mouth every 8 (eight) hours as needed for nausea or vomiting. Patient not taking: Reported on 07/10/2019 09/26/15   Leandrew Koyanagi, MD  [facility] DELICA LANCETS 11B MISC Use to check blood sugar 4 times per day dx code E11.29 01/19/18   [name], MD  [facility] VERIO test strip USE AS INSTRUCTED TO CHECK BLOOD SUGAR 4 TIMES PER DAY 01/20/19   [name], MD     Critical care time: na   Erick Colace ACNP-BC [place] Pager # [phone] OR # [phone] if no answer

## 2019-07-14 NOTE — Progress Notes (Signed)
OT Cancellation Note  Patient Details Name: Melissa [name] MRN: [id number] DOB: 03-23-1928   Cancelled Treatment:    Reason Eval/Treat Not Completed: Other (comment). Pt sitting up in recliner upon arrival of OT, appears to be feeling poorly, noted blue emesis bag in hand. Pt endorses nausea. Nursing notified. Plan to reattempt.   Tyrone Schimke, OT Acute Rehabilitation Services Pager: [phone] Office: [phone]  07/14/2019, 11:15 AM

## 2019-07-14 NOTE — Progress Notes (Signed)
Physical Therapy Treatment Patient Details Name: Melissa [name] MRN: [id number] DOB: 10/16/1927 Today's Date: 07/14/2019    History of Present Illness Pt adm with acute hypoxic respiratory failure due to bronchopneumonia. PMH - htn, dm, afib, chf, ckd, asthma, arthritis,     PT Comments    Pt appears to have declined since yesterday, as pt was unable to ambulate with PT due to fatigue, tachypnea, tachycardia. Pt tolerated stand pivot to recliner with min assist but pt was very fatigued from this. Pt with HR in the 120s during whole session with RR 22-27 breaths/min and SpO2 96% and above on 4LO2 via [place]. Pt with multiple productive coughs during session. Pt also reporting heaviness and soreness in chest, MD and RN notified of pt pain pattern and presentation during PT today. PT updated recommendation to reflect SNF vs HHPT, as PT feels pt is more appropriate for SNF level of care at this time.    Follow Up Recommendations  Home health PT;Supervision/Assistance - 24 hour;SNF(vs SNF, pt with decline since yesterday)     Equipment Recommendations  None recommended by PT    Recommendations for Other Services       Precautions / Restrictions Precautions Precautions: Fall;Other (comment) Precaution Comments: watch SpO2 Restrictions Weight Bearing Restrictions: No    Mobility  Bed Mobility Overal bed mobility: Needs Assistance Bed Mobility: Supine to Sit     Supine to sit: Min assist     General bed mobility comments: Min assist for trunk elevation, scooting to EOB. Increased time and effort to perform.  Transfers Overall transfer level: Needs assistance Equipment used: Rolling walker (2 wheeled) Transfers: Sit to/from [facility] Sit to Stand: Min assist;From elevated surface Stand pivot transfers: Min assist       General transfer comment: Min assist for steadying, guiding pt to recliner at bedside. Pt with trembling of head and UEs with mobility, pt states  this started earlier in the week.  Ambulation/Gait Ambulation/Gait assistance: (NT - pt fatigued and does not feel well)               Stairs             Wheelchair Mobility    Modified Rankin (Stroke Patients Only)       Balance Overall balance assessment: Needs assistance Sitting-balance support: No upper extremity supported;Feet supported Sitting balance-Leahy Scale: Fair     Standing balance support: Bilateral upper extremity supported Standing balance-Leahy Scale: Poor Standing balance comment: requires external support in standing                            Cognition Arousal/Alertness: Awake/alert Behavior During Therapy: WFL for tasks assessed/performed Overall Cognitive Status: Impaired/Different from baseline Area of Impairment: Problem solving;Memory;Following commands                     Memory: Decreased short-term memory Following Commands: Follows one step commands with increased time     Problem Solving: Slow processing;Decreased initiation;Difficulty sequencing;Requires verbal cues;Requires tactile cues General Comments: Pt appears distant today, increased time to respond to PT and very soft-spoken requiring pt having to repeat self.      Exercises General Exercises - Lower Extremity Heel Slides: AROM;Both;5 reps;Supine Hip Flexion/Marching: AROM;Both;5 reps;Standing    General Comments General comments (skin integrity, edema, etc.): Pt reports that her son-in-law, whom she lives with, fell last night and is "not expected to live." Pt waiting for news  from her daughter. Pt lives with this same daughter and son in law per her report. Nursing notified      Pertinent Vitals/Pain Pain Assessment: Faces Faces Pain Scale: Hurts a little bit Pain Location: chest, L side Pain Descriptors / Indicators: Sore;Heaviness Pain Intervention(s): Limited activity within patient's tolerance;Monitored during  session;Repositioned;Patient requesting pain meds-RN notified    Home Living Family/patient expects to be discharged to:: Private residence Living Arrangements: Children Available Help at Discharge: Family;Available 24 hours/day Type of Home: House Home Access: Stairs to enter Entrance Stairs-Rails: Right Home Layout: Two level;Able to live on main level with bedroom/bathroom Home Equipment: Gilford Rile - 2 wheels;Walker - 4 wheels;Cane - single point;Bedside commode;Shower seat      Prior Function Level of Independence: Needs assistance  Gait / Transfers Assistance Needed: amb in household without assistive device.  ADL's / Homemaking Assistance Needed: Pt performs bathing, dressing, toileting. Pt doesn't cook or drive.     PT Goals (current goals can now be found in the care plan section) Acute Rehab PT Goals Patient Stated Goal: goal is to return home, she is agreeable to rehab at SNF prior if needed PT Goal Formulation: With patient Time For Goal Achievement: 07/27/19 Potential to Achieve Goals: Good Progress towards PT goals: Progressing toward goals    Frequency    Min 3X/week      PT Plan Current plan remains appropriate;Discharge plan needs to be updated    Co-evaluation              AM-PAC PT "6 Clicks" Mobility   Outcome Measure  Help needed turning from your back to your side while in a flat bed without using bedrails?: A Little Help needed moving from lying on your back to sitting on the side of a flat bed without using bedrails?: A Little Help needed moving to and from a bed to a chair (including a wheelchair)?: A Little Help needed standing up from a chair using your arms (e.g., wheelchair or bedside chair)?: A Little Help needed to walk in hospital room?: A Lot Help needed climbing 3-5 steps with a railing? : Total 6 Click Score: 15    End of Session Equipment Utilized During Treatment: Gait belt;Oxygen Activity Tolerance: Patient limited by  fatigue Patient left: in chair;with call bell/phone within reach;with chair alarm set;with nursing/sitter in room Nurse Communication: Mobility status;Other (comment)(heaviness in chest, HR in 120s bpm pre-, during, post-mobility) PT Visit Diagnosis: Unsteadiness on feet (R26.81);Other abnormalities of gait and mobility (R26.89);Muscle weakness (generalized) (M62.81)     Time: 4944-9675 PT Time Calculation (min) (ACUTE ONLY): 24 min  Charges:  $Therapeutic Exercise: 8-22 mins $Therapeutic Activity: 8-22 mins                     Melissa [name], PT Acute Rehabilitation Services Pager [phone]  Office [phone]   Melissa [name] 07/14/2019, 1:20 PM

## 2019-07-14 NOTE — Progress Notes (Signed)
PROGRESS NOTE  Melissa [name] WLS:[id number] DOB: [birth year] DOA: 07/10/2019 PCP: [name], MD  Brief History:  85 y.o. female with medical history significant for chronic systolic CHF (81%), DM2, HTN, Afib on Eliquis, and HLD who presented to the ED with c/o cough for the past 2 weeks. Pt has a h/o recurrent pneumonia and chronic cough and typically has recurrences about this time every year. She is followed by Dr. [name] in [place] [facility] and was last seen there in July 2019. High res CT in Sept 2019 showed bronchiectatic changes and scarring, with concern for possible MAI vs COP vs recurrent aspiration. She had been doing fairly well since then except for chronic dry cough. 2 weeks PTA cough began to worsen, and became productive of thick mucous. She has had no fevers. She was seen in clinic (in the parking lot) on Tuesday of week PTA and started on a Z pack. Had COVID test on Wed at urgent care which was negative. Despite azithromycin she worsened and was coughing so badly that she was having frequent post-tussive emesis.  Because of worsening cough and shortness of breath, the patient's daughter brought her to the emergency department for further evaluation. She was admitted for Acute respiratory failure d/t bronchopneumonia complicating underlying bronchiectasis.  Assessment/Plan: Acute respiratory failure with hypoxia/bronchopneumonia/bronchiectasis -Secondary to pneumonia complicating underlying bronchiectasis.  Covid negative.  Chest x-ray shows bilateral multiple patchy ill-defined opacities.  Repeat chest x-ray from 07/13/2019 shows worsening patchy bilateral infiltrates.  She has remained status quo since yesterday.  Currently on 4 L of oxygen. Sputum culture negative.  MBS on 05/19/2018 showed mild aspiration risk.  Procalcitonin only 0.16.  Continue IV Zosyn and Zithromax.  Continue Xopenex, Atrovent and Pulmicort.  Now that she is not improving so I have consulted PCCM to help out  with their expert opinion. She was assessed by PT and they recommend home health care.  Paroxysmal atrial fibrillation -Rate controlled.  Continue Eliquis and metoprolol.  Diabetes mellitus type 2 -05/05/2019 hemoglobin A1c 6.1.  Blood sugar very well controlled.  Continue 12 units of Levemir along with SSI.  CKD stage III -Baseline creatinine 1.2-1.5.  Currently at baseline.  Continue to watch.  Chronic systolic CHF -1/57/2620 echo EF 15-20%, trivial MR - Trace ankle edema otherwise appears euvolemic. -Continue home dose of furosemide -Continue metoprolol succinate - Sees [name], Cardiology as OP.  Hyperlipidemia -Continue statin and Lovaza.  Hypothyroidism -Continue Synthroid -July 2020 TSH 1.72  Chronic back pain - Lidocaine patch as per reported home regimen.  Hypomagnesemia - Replace IV and f/u Mg in am.  Anemia, suspect chronic disease - Follow CBC's.  Disposition Plan:  DC home pending clinical improvement, hopefully tomorrow Family Communication: None present at bedside.  Will call him later.  Consultants: PCCM on 07/14/2019  Code Status:  FULL  DVT Prophylaxis:  apixaban  Procedures: None  Antibiotics: Zosyn 9/27>>>  Subjective: Patient seen and examined.  She was sitting in the chair.  Complaining of shortness of breath with no improvement.  No other complaint.  She had obvious upper body tremors.  Objective: Vitals:   07/14/19 0809 07/14/19 0842 07/14/19 0846 07/14/19 1017  BP: 137/68   (!) 141/46  Pulse: (!) 108     Resp: 19   (!) 21  Temp: 98.6 F (37 C)   98.5 F (36.9 C)  TempSrc: Oral   Oral  SpO2: 97% 97% 97% 93%  Weight:  Height:       No intake or output data in the 24 hours ending 07/14/19 1329 Weight change:  Exam:  General exam: Appears dyspneic with obvious upper body tremors. Respiratory system: Coarse breath sounds with rhonchi bilaterally.  Slightly tachypneic. Cardiovascular system: S1 & S2 heard, tachycardia, no  JVD, murmurs, rubs, gallops or clicks. No pedal edema. Gastrointestinal system: Abdomen is nondistended, soft and nontender. No organomegaly or masses felt. Normal bowel sounds heard. Central nervous system: Alert and oriented. No focal neurological deficits. Extremities: Symmetric 5 x 5 power. Skin: No rashes, lesions or ulcers.  Psychiatry: Judgement and insight appear poor. Mood & affect flat..   Data Reviewed: I have personally reviewed following labs and imaging studies Basic Metabolic Panel: Recent Labs  Lab 07/10/19 1503 07/11/19 0615 07/12/19 0439 07/13/19 0544 07/14/19 0543  NA 136 138 138 135 135  K 3.5 3.8 3.9 3.9 4.3  CL 99 100 103 101 99  CO2 26 28 27 25 27   GLUCOSE 188* 91 113* 96 114*  BUN 23 22 18 17 10   CREATININE 1.55* 1.44* 1.38* 1.33* 1.23*  CALCIUM 8.4* 8.0* 7.5* 8.0* 8.1*  MG  --   --  1.2* 1.8  --    Liver Function Tests: Recent Labs  Lab 07/10/19 1503  AST 19  ALT 14  ALKPHOS 89  BILITOT 0.5  PROT 7.0  ALBUMIN 2.7*   CBC: Recent Labs  Lab 07/10/19 1503 07/11/19 0615 07/12/19 0439 07/13/19 0544 07/14/19 0543  WBC 14.1* 14.4* 11.7* 11.7* 11.5*  NEUTROABS 11.1*  --   --   --  9.4*  HGB 12.3 11.0* 10.0* 10.6* 10.7*  HCT 36.7 33.1* 30.6* 32.9* 33.0*  MCV 87.6 89.7 90.5 91.1 91.2  PLT 250 232 211 261 281   Cardiac Enzymes: No results for input(s): CKTOTAL, CKMB, CKMBINDEX, TROPONINI in the last 168 hours. BNP: Invalid input(s): POCBNP CBG: Recent Labs  Lab 07/13/19 0724 07/13/19 1123 07/13/19 1626 07/13/19 2042 07/14/19 0805  GLUCAP 92 151* 148* 120* 96     Recent Results (from the past 240 hour(s))  SARS Coronavirus 2 [facility] order, Performed in [facility] hospital lab) Nasopharyngeal Nasopharyngeal Swab     Status: None   Collection Time: 07/10/19  6:46 PM   Specimen: Nasopharyngeal Swab  Result Value Ref Range Status   SARS Coronavirus 2 NEGATIVE NEGATIVE Final    Comment: (NOTE) If result is NEGATIVE SARS-CoV-2  target nucleic acids are NOT DETECTED. The SARS-CoV-2 RNA is generally detectable in upper and lower  respiratory specimens during the acute phase of infection. The lowest  concentration of SARS-CoV-2 viral copies this assay can detect is 250  copies / mL. A negative result does not preclude SARS-CoV-2 infection  and should not be used as the sole basis for treatment or other  patient management decisions.  A negative result may occur with  improper specimen collection / handling, submission of specimen other  than nasopharyngeal swab, presence of viral mutation(s) within the  areas targeted by this assay, and inadequate number of viral copies  (<250 copies / mL). A negative result must be combined with clinical  observations, patient history, and epidemiological information. If result is POSITIVE SARS-CoV-2 target nucleic acids are DETECTED. The SARS-CoV-2 RNA is generally detectable in upper and lower  respiratory specimens dur ing the acute phase of infection.  Positive  results are indicative of active infection with SARS-CoV-2.  Clinical  correlation with patient history and other diagnostic information is  necessary to determine patient infection status.  Positive results do  not rule out bacterial infection or co-infection with other viruses. If result is PRESUMPTIVE POSTIVE SARS-CoV-2 nucleic acids MAY BE PRESENT.   A presumptive positive result was obtained on the submitted specimen  and confirmed on repeat testing.  While 2019 novel coronavirus  (SARS-CoV-2) nucleic acids may be present in the submitted sample  additional confirmatory testing may be necessary for epidemiological  and / or clinical management purposes  to differentiate between  SARS-CoV-2 and other Sarbecovirus currently known to infect humans.  If clinically indicated additional testing with an alternate test  methodology [phone]) is advised. The SARS-CoV-2 RNA is generally  detectable in upper and lower  respiratory sp ecimens during the acute  phase of infection. The expected result is Negative. Fact Sheet for Patients:  StrictlyIdeas.no Fact Sheet for Healthcare Providers: BankingDealers.co.za This test is not yet approved or cleared by the [place] FDA and has been authorized for detection and/or diagnosis of SARS-CoV-2 by FDA under an Emergency Use Authorization (EUA).  This EUA will remain in effect (meaning this test can be used) for the duration of the COVID-19 declaration under Section 564(b)(1) of the Act, 21 U.S.C. section 360bbb-3(b)(1), unless the authorization is terminated or revoked sooner. Performed at [facility], [place] [address] [place], [place] [address]   Culture, sputum-assessment     Status: None   Collection Time: 07/11/19  1:13 PM   Specimen: Sputum  Result Value Ref Range Status   Specimen Description SPU  Final   Special Requests NONE  Final   Sputum evaluation   Final    THIS SPECIMEN IS ACCEPTABLE FOR SPUTUM CULTURE Performed at [facility], [address] [place], [place] [address]    Report Status 07/11/2019 FINAL  Final  Culture, respiratory     Status: None   Collection Time: 07/11/19  1:13 PM   Specimen: Sputum  Result Value Ref Range Status   Specimen Description SPU  Final   Special Requests NONE Reflexed from [id number]  Final   Gram Stain   Final    ABUNDANT WBC PRESENT, PREDOMINANTLY PMN NO ORGANISMS SEEN    Culture   Final    Consistent with normal respiratory flora. Performed at [facility], [place] [address], [place], [place] [address]    Report Status 07/13/2019 FINAL  Final     Scheduled Meds: . apixaban  5 mg Oral BID  . atorvastatin  40 mg Oral Daily  . budesonide (PULMICORT) nebulizer solution  0.25 mg Nebulization BID  . cholecalciferol  1,000 Units Oral Daily  . FLUoxetine  20 mg Oral Daily  . [START ON 07/15/2019] influenza vaccine adjuvanted  0.5 mL  Intramuscular Tomorrow-1000  . insulin aspart  0-5 Units Subcutaneous QHS  . insulin aspart  0-9 Units Subcutaneous TID WC  . insulin detemir  12 Units Subcutaneous QHS  . ipratropium  0.5 mg Nebulization BID  . levalbuterol  1.25 mg Nebulization BID  . levothyroxine  50 mcg Oral Daily  . lidocaine  1 patch Transdermal Q24H  . metoprolol succinate  12.5 mg Oral Daily  . multivitamin  1 tablet Oral BID  . multivitamins with iron  1 tablet Oral Daily  . omega-3 acid ethyl esters  1,000 mg Oral Daily  . pantoprazole  40 mg Oral Daily  . pramipexole  0.75 mg Oral QHS  . pregabalin  50 mg Oral TID  . traZODone  100 mg Oral QHS   Continuous Infusions: . azithromycin Stopped (07/13/19 1900)  . piperacillin-tazobactam (ZOSYN)  IV 3.375 g (07/14/19 1012)    Procedures/Studies: Dg Chest 2 View  Result Date: 07/13/2019 CLINICAL DATA:  Shortness of breath for 2 weeks. Recent diagnosis of pneumonia. EXAM: CHEST - 2 VIEW COMPARISON:  PA and lateral chest 07/10/2019. FINDINGS: Patchy bilateral airspace disease has worsened, particularly in the left lung base. There is a small left pleural effusion. Heart size is normal. No pneumothorax. Aortic atherosclerosis is noted. IMPRESSION: Worsened patchy bilateral airspace disease most consistent with pneumonia. Atypical/viral infection is possible. New small left pleural effusion. Atherosclerosis. Electronically Signed   By: Inge Rise M.D.   On: 07/13/2019 15:01   Dg Chest 2 View  Result Date: 07/10/2019 CLINICAL DATA:  Shortness of breath. EXAM: CHEST - 2 VIEW COMPARISON:  Radiograph of Feb 27, 2018. CT scan of March 27, 2018 and July 08, 2018. FINDINGS: The heart size and mediastinal contours are within normal limits. No pneumothorax or pleural effusion is noted. There are multiple ill-defined opacities throughout both lungs, some of which were present on prior exam and some of which are new. The visualized skeletal structures are unremarkable.  IMPRESSION: Multiple ill-defined opacities are noted throughout both lungs which are consistent with recurrent bronchopneumonia as described on prior CT scan. Aortic Atherosclerosis (ICD10-I70.0). Electronically Signed   By: Marijo Conception M.D.   On: 07/10/2019 15:37    Darliss Cheney, MD [facility]  To contact the attending provider between 7A-7P or the covering provider during after hours 7P-7A, please log into the web site [email or web address] and access using universal [place] password for that web site. If you do not have the password, please call the hospital operator.   LOS: 4 days

## 2019-07-14 NOTE — Evaluation (Signed)
Occupational Therapy Evaluation Patient Details Name: Melissa [name] MRN: [id number] DOB: May 09, 1928 Today's Date: 07/14/2019    History of Present Illness Pt adm with acute hypoxic respiratory failure due to bronchopneumonia. PMH - htn, dm, afib, chf, ckd, asthma, arthritis,    Clinical Impression   Pt admitted with the above diagnoses and presents with below problem list. Pt will benefit from continued acute OT to address the below listed deficits and maximize independence with basic ADLs prior to d/c to next venue. Pt is from home where she lives with her daughter and son in law, both of whom are retired. PTA pt was mod I with ADLs. Pt presents with decreased activity tolerance and generalized weakness. Pt is currently setup to min A with ADLs and functional transfers.      Follow Up Recommendations  SNF;Supervision/Assistance - 24 hour    Equipment Recommendations  Other (comment)(TBD)    Recommendations for Other Services       Precautions / Restrictions Precautions Precautions: Fall;Other (comment) Precaution Comments: watch SpO2 Restrictions Weight Bearing Restrictions: No      Mobility Bed Mobility               General bed mobility comments: up in chair  Transfers Overall transfer level: Needs assistance Equipment used: Rolling walker (2 wheeled) Transfers: Sit to/from Stand Sit to Stand: Min assist         General transfer comment: steadying assist and to control descent    Balance Overall balance assessment: Needs assistance Sitting-balance support: No upper extremity supported;Feet supported Sitting balance-Leahy Scale: Fair     Standing balance support: Bilateral upper extremity supported Standing balance-Leahy Scale: Poor Standing balance comment: rollator and min guard for static standin                           ADL either performed or assessed with clinical judgement   ADL Overall ADL's : Needs  assistance/impaired Eating/Feeding: Set up;Sitting   Grooming: Set up;Minimal assistance;Sitting   Upper Body Bathing: Minimal assistance;Sitting   Lower Body Bathing: Minimal assistance;Sit to/from stand   Upper Body Dressing : Set up;Sitting   Lower Body Dressing: Minimal assistance;Sit to/from stand   Toilet Transfer: Min guard;Minimal Systems analyst Details (indicate cue type and reason): clinical judgement.            General ADL Comments: Pt with limited activity tolerance. SOB with minmal activity. HR up 122 with slow walking in place about 20 seconds     Vision         Perception     Praxis      Pertinent Vitals/Pain Pain Assessment: Faces Faces Pain Scale: Hurts a little bit Pain Location: unspecified Pain Descriptors / Indicators: Aching Pain Intervention(s): Limited activity within patient's tolerance;Monitored during session;Repositioned     Hand Dominance Right   Extremity/Trunk Assessment Upper Extremity Assessment Upper Extremity Assessment: Generalized weakness   Lower Extremity Assessment Lower Extremity Assessment: Defer to PT evaluation       Communication Communication Communication: HOH   Cognition Arousal/Alertness: Awake/alert Behavior During Therapy: WFL for tasks assessed/performed Overall Cognitive Status: Within Functional Limits for tasks assessed                                     General Comments  Pt reports that her son-in-law, whom she lives with, fell last night and is "  not expected to live." Pt waiting for news from her daughter. Pt lives with this same daughter and son in law per her report. Nursing notified    Exercises     Shoulder Instructions      Home Living Family/patient expects to be discharged to:: Private residence Living Arrangements: Children Available Help at Discharge: Family;Available 24 hours/day Type of Home: House Home Access: Stairs to  enter [employer] of Steps: 4-5 Entrance Stairs-Rails: Right Home Layout: Two level;Able to live on main level with bedroom/bathroom               Home Equipment: Gilford Rile - 2 wheels;Walker - 4 wheels;Cane - single point;Bedside commode;Shower seat          Prior Functioning/Environment Level of Independence: Needs assistance  Gait / Transfers Assistance Needed: amb in household without assistive device.  ADL's / Homemaking Assistance Needed: Pt performs bathing, dressing, toileting. Pt doesn't cook or drive.            OT Problem List: Decreased strength;Decreased activity tolerance;Impaired balance (sitting and/or standing);Decreased knowledge of use of DME or AE;Decreased knowledge of precautions;Cardiopulmonary status limiting activity;Pain      OT Treatment/Interventions: Self-care/ADL training;Therapeutic exercise;Energy conservation;DME and/or AE instruction;Therapeutic activities;Patient/family education;Balance training    OT Goals(Current goals can be found in the care plan section) Acute Rehab OT Goals Patient Stated Goal: goal is to return home, she is agreeable to rehab at SNF prior if needed OT Goal Formulation: With patient Time For Goal Achievement: 07/28/19 Potential to Achieve Goals: Good ADL Goals Pt Will Perform Grooming: with modified independence;sitting;standing Pt Will Perform Upper Body Bathing: with set-up;sitting Pt Will Perform Lower Body Bathing: with modified independence;sit to/from stand Pt Will Perform Upper Body Dressing: with set-up;sitting Pt Will Perform Lower Body Dressing: with supervision;sit to/from stand Pt Will Transfer to Toilet: with supervision;ambulating Pt Will Perform Toileting - Clothing Manipulation and hygiene: with supervision;sit to/from stand Pt Will Perform Tub/Shower Transfer: with supervision;ambulating;rolling walker;3 in 1;shower seat  OT Frequency: Min 2X/week   Barriers to D/C: Decreased caregiver  support(possibly?)  see general comments       Co-evaluation              AM-PAC OT "6 Clicks" Daily Activity     Outcome Measure Help from another person eating meals?: None Help from another person taking care of personal grooming?: A Little Help from another person toileting, which includes using toliet, bedpan, or urinal?: A Little Help from another person bathing (including washing, rinsing, drying)?: A Little Help from another person to put on and taking off regular upper body clothing?: A Little Help from another person to put on and taking off regular lower body clothing?: A Little 6 Click Score: 19   End of Session Equipment Utilized During Treatment: Rolling walker;Oxygen Nurse Communication: Other (comment)(see general comments)  Activity Tolerance: Patient limited by fatigue;Other (comment)(feeling poorly) Patient left: in chair;with call bell/phone within reach;with chair alarm set  OT Visit Diagnosis: Unsteadiness on feet (R26.81);Muscle weakness (generalized) (M62.81)                Time: 6546-5035 OT Time Calculation (min): 11 min Charges:  OT General Charges $OT Visit: 1 Visit OT Evaluation $OT Eval Low Complexity: [place], OT Acute Rehabilitation Services Pager: [phone] Office: [phone]   Hortencia Pilar 07/14/2019, 12:46 PM

## 2019-07-14 NOTE — Plan of Care (Signed)

## 2019-07-15 ENCOUNTER — Inpatient Hospital Stay (HOSPITAL_COMMUNITY): Payer: Medicare Other

## 2019-07-15 ENCOUNTER — Encounter (HOSPITAL_COMMUNITY): Payer: Self-pay

## 2019-07-15 DIAGNOSIS — J9601 Acute respiratory failure with hypoxia: Secondary | ICD-10-CM

## 2019-07-15 DIAGNOSIS — J189 Pneumonia, unspecified organism: Secondary | ICD-10-CM

## 2019-07-15 DIAGNOSIS — I5022 Chronic systolic (congestive) heart failure: Secondary | ICD-10-CM

## 2019-07-15 LAB — CBC WITH DIFFERENTIAL/PLATELET
Abs Immature Granulocytes: 0.55 10*3/uL — ABNORMAL HIGH (ref 0.00–0.07)
Basophils Absolute: 0.1 10*3/uL (ref 0.0–0.1)
Basophils Relative: 1 %
Eosinophils Absolute: 0.2 10*3/uL (ref 0.0–0.5)
Eosinophils Relative: 2 %
HCT: 33.8 % — ABNORMAL LOW (ref 36.0–46.0)
Hemoglobin: 10.6 g/dL — ABNORMAL LOW (ref 12.0–15.0)
Immature Granulocytes: 4 %
Lymphocytes Relative: 10 %
Lymphs Abs: 1.4 10*3/uL (ref 0.7–4.0)
MCH: 28.5 pg (ref 26.0–34.0)
MCHC: 31.4 g/dL (ref 30.0–36.0)
MCV: 90.9 fL (ref 80.0–100.0)
Monocytes Absolute: 1 10*3/uL (ref 0.1–1.0)
Monocytes Relative: 7 %
Neutro Abs: 10.8 10*3/uL — ABNORMAL HIGH (ref 1.7–7.7)
Neutrophils Relative %: 76 %
Platelets: 340 10*3/uL (ref 150–400)
RBC: 3.72 MIL/uL — ABNORMAL LOW (ref 3.87–5.11)
RDW: 13.8 % (ref 11.5–15.5)
WBC: 14.1 10*3/uL — ABNORMAL HIGH (ref 4.0–10.5)
nRBC: 0 % (ref 0.0–0.2)

## 2019-07-15 LAB — RESPIRATORY PANEL BY PCR

## 2019-07-15 LAB — GLUCOSE, CAPILLARY
Glucose-Capillary: 191 mg/dL — ABNORMAL HIGH (ref 70–99)
Glucose-Capillary: 169 mg/dL — ABNORMAL HIGH (ref 70–99)
Glucose-Capillary: 209 mg/dL — ABNORMAL HIGH (ref 70–99)
Glucose-Capillary: 134 mg/dL — ABNORMAL HIGH (ref 70–99)
Glucose-Capillary: 207 mg/dL — ABNORMAL HIGH (ref 70–99)
Glucose-Capillary: 199 mg/dL — ABNORMAL HIGH (ref 70–99)
Glucose-Capillary: 183 mg/dL — ABNORMAL HIGH (ref 70–99)

## 2019-07-15 LAB — BASIC METABOLIC PANEL
Anion gap: 11 (ref 5–15)
BUN: 10 mg/dL (ref 8–23)
CO2: 31 mmol/L (ref 22–32)
Calcium: 8.3 mg/dL — ABNORMAL LOW (ref 8.9–10.3)
Chloride: 93 mmol/L — ABNORMAL LOW (ref 98–111)
Creatinine, Ser: 1.31 mg/dL — ABNORMAL HIGH (ref 0.44–1.00)
GFR calc Af Amer: 42 mL/min — ABNORMAL LOW (ref >60)
GFR calc non Af Amer: 36 mL/min — ABNORMAL LOW (ref >60)
Glucose, Bld: 110 mg/dL — ABNORMAL HIGH (ref 70–99)
Potassium: 3.6 mmol/L (ref 3.5–5.1)
Sodium: 135 mmol/L (ref 135–145)

## 2019-07-15 LAB — MAGNESIUM: Magnesium: 1.5 mg/dL — ABNORMAL LOW (ref 1.7–2.4)

## 2019-07-15 MED ORDER — AMOXICILLIN-POT CLAVULANATE 500-125 MG PO TABS
1.0000 | ORAL_TABLET | Freq: Two times a day (BID) | ORAL | Status: DC
  Administered 2019-07-15 – 2019-07-19 (×8): 500 mg via ORAL
  Filled 2019-07-15 (×10): qty 1

## 2019-07-15 MED ORDER — DILTIAZEM HCL 25 MG/5ML IV SOLN
5.0000 mg | Freq: Once | INTRAVENOUS | Status: AC
  Administered 2019-07-15: 5 mg via INTRAVENOUS
  Filled 2019-07-15: qty 5

## 2019-07-15 MED ORDER — AMIODARONE HCL IN DEXTROSE 360-4.14 MG/200ML-% IV SOLN
60.0000 mg/h | INTRAVENOUS | Status: AC
  Administered 2019-07-15: 60 mg/h via INTRAVENOUS
  Filled 2019-07-15: qty 200

## 2019-07-15 MED ORDER — AMIODARONE LOAD VIA INFUSION
150.0000 mg | Freq: Once | INTRAVENOUS | Status: AC
  Administered 2019-07-15: 150 mg via INTRAVENOUS
  Filled 2019-07-15: qty 83.34

## 2019-07-15 MED ORDER — DILTIAZEM HCL-DEXTROSE 100-5 MG/100ML-% IV SOLN (PREMIX)
5.0000 mg/h | INTRAVENOUS | Status: DC
  Administered 2019-07-15: 5 mg/h via INTRAVENOUS
  Filled 2019-07-15: qty 100

## 2019-07-15 MED ORDER — IPRATROPIUM BROMIDE 0.02 % IN SOLN
0.5000 mg | Freq: Three times a day (TID) | RESPIRATORY_TRACT | Status: DC
  Administered 2019-07-15 – 2019-07-26 (×30): 0.5 mg via RESPIRATORY_TRACT
  Filled 2019-07-15 (×34): qty 2.5

## 2019-07-15 MED ORDER — MAGNESIUM SULFATE 2 GM/50ML IV SOLN
2.0000 g | Freq: Once | INTRAVENOUS | Status: AC
  Administered 2019-07-15: 2 g via INTRAVENOUS
  Filled 2019-07-15: qty 50

## 2019-07-15 MED ORDER — LEVALBUTEROL HCL 1.25 MG/0.5ML IN NEBU
1.2500 mg | INHALATION_SOLUTION | Freq: Three times a day (TID) | RESPIRATORY_TRACT | Status: DC
  Administered 2019-07-15 – 2019-07-16 (×4): 1.25 mg via RESPIRATORY_TRACT
  Filled 2019-07-15 (×4): qty 0.5

## 2019-07-15 MED ORDER — AMIODARONE HCL IN DEXTROSE 360-4.14 MG/200ML-% IV SOLN
30.0000 mg/h | INTRAVENOUS | Status: DC
  Administered 2019-07-15 – 2019-07-16 (×3): 30 mg/h via INTRAVENOUS
  Filled 2019-07-15 (×4): qty 200

## 2019-07-15 NOTE — Progress Notes (Signed)
   Vital Signs MEWS/VS Documentation      07/15/2019 0200 07/15/2019 0226 07/15/2019 0238 07/15/2019 0353   MEWS Score:  1  4  4  3    MEWS Score Color:  Green  Red  Red  Yellow   Resp:  (!) 23  -  -  19   Pulse:  -  -  (!) 130 132   BP:  -  -  110/61 100/59   O2 Device:  -  -  Nasal Cannula Nasal Cannula   O2 Flow Rate (L/min):  -  -  3 L/min 3 L/min     Patient's Hr at this time ins sustaining in the 130s-140s. Paged Kirby,NP at [phone] for PRN  Or one time dose for elevated HR. Baltazar Najjar, NP called and placed order for Diltiazem 5mg  IV once, gave medication (see EMAR), HR still sustaining in the 130s. Paged Kirby,NP again about HR and she placed another one time dose for Diltiazem 5mg  IV and Diltiazem infusion to titrate as needed. Diltiazem 5mg  given at 0330, Hr remains in 130s. Awaiting gtt from pharmacy at this time  Patient is asymptomatic, no chest pain, no palpitations, will continue to monitor      Hillary Bow 07/15/2019,3:54 AM

## 2019-07-15 NOTE — Progress Notes (Signed)
Rapid respone called and notified of the patient's MEWS of 4. Pt has increased HR and RR, no complaints of pain or discomfort, no Chest Pain reported, Other VSS, BP WNL, on 3LNC stats greater than 95%. Will call Rapid Response back if assistance is needed.

## 2019-07-15 NOTE — Progress Notes (Addendum)
MEWS/VS Documentation      07/15/2019 0700 07/15/2019 0742 07/15/2019 0746 07/15/2019 0801   MEWS Score:  6  -  -  3   MEWS Score Color:  Red  -  -  Yellow   Resp:  (!) 23  -  -  (!) 23   Pulse:  -  -  -  (!) 126   BP:  (!) 76/49  -  -  (!) 122/58   Temp:  -  -  -  98.6 F (37 C)   O2 Device:  -  Nasal Cannula  Nasal Cannula  Nasal Cannula   O2 Flow Rate (L/min):  -  3 L/min  3 L/min  3 L/min      MD notified of pt's heart rate sustained 120s-130s Afib.  Orders received to change pt status to progressive care.  Red Mews continued from night shift.   Upon assessment,patient awakens to voice.  Disoriented to time and situation.  Able to eat breakfast.  Fatigues very easily and breathing slightly tachynpeic. Breath sounds clear/dim throughout. Heart rate 140s up to 150s (Afib) while awake and eating.  Reports no chest pain at this time but stated did have some earlier this morning around 4-5 am. Pt reported chest pain at that time was "about a 9" and states the pain resolved early this morning.  Pt fatigued and falling asleep while answering questions.
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
## 2019-07-15 NOTE — Progress Notes (Signed)
PROGRESS NOTE  Melissa [name] WJX:[id number] DOB: [birth year] DOA: 07/10/2019 PCP: [name], MD  Brief History:  85 y.o. female with medical history significant for chronic systolic CHF (21%), DM2, HTN, Afib on Eliquis, and HLD who presented to the ED with c/o cough for the past 2 weeks. Pt has a h/o recurrent pneumonia and chronic cough and typically has recurrences about this time every year. She is followed by Dr. [name] in [place] [facility] and was last seen there in July 2019. High res CT in Sept 2019 showed bronchiectatic changes and scarring, with concern for possible MAI vs COP vs recurrent aspiration. She had been doing fairly well since then except for chronic dry cough. 2 weeks PTA cough began to worsen, and became productive of thick mucous. She has had no fevers. She was seen in clinic (in the parking lot) on Tuesday of week PTA and started on a Z pack. Had COVID test on Wed at urgent care which was negative. Despite azithromycin she worsened and was coughing so badly that she was having frequent post-tussive emesis.  Because of worsening cough and shortness of breath, the patient's daughter brought her to the emergency department for further evaluation. She was admitted for Acute respiratory failure d/t bronchopneumonia complicating underlying bronchiectasis.  She was continued on antibiotics however she did not improve.  Repeat chest x-ray on 07/13/2019 showed worsening of bilateral patchy opacities.  PCCM was consulted on 07/14/2019.  Early morning of 07/15/2019, patient went into atrial fibrillation with RVR.  She did not respond to diltiazem bolus and was started on amiodarone drip.  Cardiology was consulted on 07/15/2019.  Assessment/Plan: Acute respiratory failure with hypoxia/bronchopneumonia/bronchiectasis -Secondary to pneumonia complicating underlying bronchiectasis.  Covid negative.  Chest x-ray shows bilateral multiple patchy ill-defined opacities.  Repeat chest x-ray from  07/13/2019 shows worsening patchy bilateral infiltrates.  Seen by PCCM.  Appreciate their help.  Still on 3 to 4 L of oxygen but she looks much better than yesterday.  Calm and does not have any tremors.  MBS on 05/19/2018 showed mild aspiration risk.  Procalcitonin only 0.16.  Zosyn and azithromycin were switched to oral Augmentin by PCCM on 07/14/2019.  Continue Xopenex, Atrovent and Pulmicort.  Slight worsened leukocytosis but she remains afebrile.  Paroxysmal atrial fibrillation Went into rapid ventricular response last night with no results from diltiazem bolus and subsequent was started on amiodarone drip.  I have consulted pulmonary to help with the management.  Diabetes mellitus type 2 -05/05/2019 hemoglobin A1c 6.1.  Blood sugar very well controlled.  Continue 12 units of Levemir along with SSI.  CKD stage III -Baseline creatinine 1.2-1.5.  Currently at baseline.  Continue to watch.  Chronic systolic CHF -12/19/6576 echo EF 15-20%, trivial MR - Trace ankle edema otherwise appears euvolemic. -Continue home dose of furosemide -Continue metoprolol succinate - Sees [name], Cardiology as OP.  Hyperlipidemia -Continue statin and Lovaza.  Hypothyroidism -Continue Synthroid -July 2020 TSH 1.72  Chronic back pain - Lidocaine patch as per reported home regimen.  Hypomagnesemia -1.5 again today.  Will replace with magnesium sulfate.  Anemia, suspect chronic disease - Follow CBC's.  Stable.  Disposition Plan:  DC home pending clinical improvement, hopefully tomorrow Family Communication: None present at bedside.  Will call him later.  Consultants: PCCM on 07/14/2019  Code Status:  FULL  DVT Prophylaxis:  apixaban  Procedures: None  Antibiotics: Zosyn 9/27>>> 07/14/2019 Oral Augmentin 07/14/2019>   Subjective: Patient  seen and examined.  She is sitting in the bed and looks very comfortable.  No more tremors in the upper body today.  Although she still complains of some  shortness of breath but looks much better than yesterday.  Objective: Vitals:   07/15/19 0800 07/15/19 0801 07/15/19 0904 07/15/19 0915  BP: (!) 122/58 (!) 122/58 (!) 102/58   Pulse:  (!) 126 (!) 126   Resp: (!) 23 (!) 23 17   Temp:  98.6 F (37 C)  98.3 F (36.8 C)  TempSrc:  Oral  Oral  SpO2: 100% 100% 98% 99%  Weight:      Height:       No intake or output data in the 24 hours ending 07/15/19 1106 Weight change:  Exam:  General exam: Appears calm and comfortable  Respiratory system: Coarse breath sounds with bilateral rhonchi,. Respiratory effort normal. Cardiovascular system: S1 & S2 heard, irregularly regular rate and rhythm. No JVD, murmurs, rubs, gallops or clicks. No pedal edema. Gastrointestinal system: Abdomen is nondistended, soft and nontender. No organomegaly or masses felt. Normal bowel sounds heard. Central nervous system: Alert and oriented. No focal neurological deficits. Extremities: Symmetric 5 x 5 power. Skin: No rashes, lesions or ulcers.  Psychiatry: Judgement and insight appear poor. Mood & affect flat.   Data Reviewed: I have personally reviewed following labs and imaging studies Basic Metabolic Panel: Recent Labs  Lab 07/11/19 0615 07/12/19 0439 07/13/19 0544 07/14/19 0543 07/15/19 0444  NA 138 138 135 135 135  K 3.8 3.9 3.9 4.3 3.6  CL 100 103 101 99 93*  CO2 28 27 25 27 31   GLUCOSE 91 113* 96 114* 110*  BUN 22 18 17 10 10   CREATININE 1.44* 1.38* 1.33* 1.23* 1.31*  CALCIUM 8.0* 7.5* 8.0* 8.1* 8.3*  MG  --  1.2* 1.8  --  1.5*   Liver Function Tests: Recent Labs  Lab 07/10/19 1503  AST 19  ALT 14  ALKPHOS 89  BILITOT 0.5  PROT 7.0  ALBUMIN 2.7*   CBC: Recent Labs  Lab 07/10/19 1503 07/11/19 0615 07/12/19 0439 07/13/19 0544 07/14/19 0543 07/15/19 0444  WBC 14.1* 14.4* 11.7* 11.7* 11.5* 14.1*  NEUTROABS 11.1*  --   --   --  9.4* 10.8*  HGB 12.3 11.0* 10.0* 10.6* 10.7* 10.6*  HCT 36.7 33.1* 30.6* 32.9* 33.0* 33.8*  MCV 87.6  89.7 90.5 91.1 91.2 90.9  PLT 250 232 211 261 281 340   Cardiac Enzymes: No results for input(s): CKTOTAL, CKMB, CKMBINDEX, TROPONINI in the last 168 hours. BNP: Invalid input(s): POCBNP CBG: Recent Labs  Lab 07/13/19 0724 07/13/19 1123 07/13/19 1626 07/13/19 2042 07/14/19 0805  GLUCAP 92 151* 148* 120* 96     Recent Results (from the past 240 hour(s))  SARS Coronavirus 2 [facility] order, Performed in [facility] hospital lab) Nasopharyngeal Nasopharyngeal Swab     Status: None   Collection Time: 07/10/19  6:46 PM   Specimen: Nasopharyngeal Swab  Result Value Ref Range Status   SARS Coronavirus 2 NEGATIVE NEGATIVE Final    Comment: (NOTE) If result is NEGATIVE SARS-CoV-2 target nucleic acids are NOT DETECTED. The SARS-CoV-2 RNA is generally detectable in upper and lower  respiratory specimens during the acute phase of infection. The lowest  concentration of SARS-CoV-2 viral copies this assay can detect is 250  copies / mL. A negative result does not preclude SARS-CoV-2 infection  and should not be used as the sole basis for treatment or other  patient management decisions.  A negative result may occur with  improper specimen collection / handling, submission of specimen other  than nasopharyngeal swab, presence of viral mutation(s) within the  areas targeted by this assay, and inadequate number of viral copies  (<250 copies / mL). A negative result must be combined with clinical  observations, patient history, and epidemiological information. If result is POSITIVE SARS-CoV-2 target nucleic acids are DETECTED. The SARS-CoV-2 RNA is generally detectable in upper and lower  respiratory specimens dur ing the acute phase of infection.  Positive  results are indicative of active infection with SARS-CoV-2.  Clinical  correlation with patient history and other diagnostic information is  necessary to determine patient infection status.  Positive results do  not rule out  bacterial infection or co-infection with other viruses. If result is PRESUMPTIVE POSTIVE SARS-CoV-2 nucleic acids MAY BE PRESENT.   A presumptive positive result was obtained on the submitted specimen  and confirmed on repeat testing.  While 2019 novel coronavirus  (SARS-CoV-2) nucleic acids may be present in the submitted sample  additional confirmatory testing may be necessary for epidemiological  and / or clinical management purposes  to differentiate between  SARS-CoV-2 and other Sarbecovirus currently known to infect humans.  If clinically indicated additional testing with an alternate test  methodology [phone]) is advised. The SARS-CoV-2 RNA is generally  detectable in upper and lower respiratory sp ecimens during the acute  phase of infection. The expected result is Negative. Fact Sheet for Patients:  StrictlyIdeas.no Fact Sheet for Healthcare Providers: BankingDealers.co.za This test is not yet approved or cleared by the [place] FDA and has been authorized for detection and/or diagnosis of SARS-CoV-2 by FDA under an Emergency Use Authorization (EUA).  This EUA will remain in effect (meaning this test can be used) for the duration of the COVID-19 declaration under Section 564(b)(1) of the Act, 21 U.S.C. section 360bbb-3(b)(1), unless the authorization is terminated or revoked sooner. Performed at [facility], [place] [address], [place], [place] [address]   Culture, sputum-assessment     Status: None   Collection Time: 07/11/19  1:13 PM   Specimen: Sputum  Result Value Ref Range Status   Specimen Description SPU  Final   Special Requests NONE  Final   Sputum evaluation   Final    THIS SPECIMEN IS ACCEPTABLE FOR SPUTUM CULTURE Performed at [name], [address], [place], [place] [address]    Report Status 07/11/2019 FINAL  Final  Culture, respiratory     Status: None   Collection Time: 07/11/19  1:13 PM    Specimen: Sputum  Result Value Ref Range Status   Specimen Description SPU  Final   Special Requests NONE Reflexed from [id number]  Final   Gram Stain   Final    ABUNDANT WBC PRESENT, PREDOMINANTLY PMN NO ORGANISMS SEEN    Culture   Final    Consistent with normal respiratory flora. Performed at [facility], [place] [address], [place], [place] [address]    Report Status 07/13/2019 FINAL  Final  Respiratory Panel by PCR     Status: None   Collection Time: 07/14/19  7:35 PM   Specimen: Nasopharyngeal Swab; Respiratory  Result Value Ref Range Status   Adenovirus NOT DETECTED NOT DETECTED Final   Coronavirus 229E NOT DETECTED NOT DETECTED Final    Comment: (NOTE) The Coronavirus on the Respiratory Panel, DOES NOT test for the novel  Coronavirus (2019 nCoV)  Coronavirus HKU1 NOT DETECTED NOT DETECTED Final   Coronavirus NL63 NOT DETECTED NOT DETECTED Final   Coronavirus OC43 NOT DETECTED NOT DETECTED Final   Metapneumovirus NOT DETECTED NOT DETECTED Final   Rhinovirus / Enterovirus NOT DETECTED NOT DETECTED Final   Influenza A NOT DETECTED NOT DETECTED Final   Influenza B NOT DETECTED NOT DETECTED Final   Parainfluenza Virus 1 NOT DETECTED NOT DETECTED Final   Parainfluenza Virus 2 NOT DETECTED NOT DETECTED Final   Parainfluenza Virus 3 NOT DETECTED NOT DETECTED Final   Parainfluenza Virus 4 NOT DETECTED NOT DETECTED Final   Respiratory Syncytial Virus NOT DETECTED NOT DETECTED Final   Bordetella pertussis NOT DETECTED NOT DETECTED Final   Chlamydophila pneumoniae NOT DETECTED NOT DETECTED Final   Mycoplasma pneumoniae NOT DETECTED NOT DETECTED Final    Comment: Performed at [facility], [place] [address], [place], [place] [address]     Scheduled Meds: . amoxicillin-clavulanate  1 tablet Oral Q12H  . apixaban  5 mg Oral BID  . atorvastatin  40 mg Oral Daily  . budesonide (PULMICORT) nebulizer solution  0.25 mg Nebulization BID  . cholecalciferol  1,000 Units Oral  Daily  . FLUoxetine  20 mg Oral Daily  . furosemide  20 mg Oral Daily  . influenza vaccine adjuvanted  0.5 mL Intramuscular Tomorrow-1000  . insulin aspart  0-5 Units Subcutaneous QHS  . insulin aspart  0-9 Units Subcutaneous TID WC  . insulin detemir  12 Units Subcutaneous QHS  . ipratropium  0.5 mg Nebulization QID  . levalbuterol  1.25 mg Nebulization QID  . levothyroxine  50 mcg Oral Daily  . lidocaine  1 patch Transdermal Q24H  . metoprolol succinate  12.5 mg Oral Daily  . multivitamin  1 tablet Oral BID  . multivitamins with iron  1 tablet Oral Daily  . omega-3 acid ethyl esters  1,000 mg Oral Daily  . pantoprazole  40 mg Oral Daily  . pramipexole  0.75 mg Oral QHS  . pregabalin  50 mg Oral TID  . traZODone  100 mg Oral QHS   Continuous Infusions: . amiodarone 60 mg/hr (07/15/19 0711)   Followed by  . amiodarone    . magnesium sulfate bolus IVPB 2 g (07/15/19 1044)    Procedures/Studies: Dg Chest 2 View  Result Date: 07/13/2019 CLINICAL DATA:  Shortness of breath for 2 weeks. Recent diagnosis of pneumonia. EXAM: CHEST - 2 VIEW COMPARISON:  PA and lateral chest 07/10/2019. FINDINGS: Patchy bilateral airspace disease has worsened, particularly in the left lung base. There is a small left pleural effusion. Heart size is normal. No pneumothorax. Aortic atherosclerosis is noted. IMPRESSION: Worsened patchy bilateral airspace disease most consistent with pneumonia. Atypical/viral infection is possible. New small left pleural effusion. Atherosclerosis. Electronically Signed   By: Inge Rise M.D.   On: 07/13/2019 15:01   Dg Chest 2 View  Result Date: 07/10/2019 CLINICAL DATA:  Shortness of breath. EXAM: CHEST - 2 VIEW COMPARISON:  Radiograph of Feb 27, 2018. CT scan of March 27, 2018 and July 08, 2018. FINDINGS: The heart size and mediastinal contours are within normal limits. No pneumothorax or pleural effusion is noted. There are multiple ill-defined opacities throughout  both lungs, some of which were present on prior exam and some of which are new. The visualized skeletal structures are unremarkable. IMPRESSION: Multiple ill-defined opacities are noted throughout both lungs which are consistent with recurrent bronchopneumonia as described on prior CT scan. Aortic Atherosclerosis (ICD10-I70.0). Electronically  Signed   By: Marijo Conception M.D.   On: 07/10/2019 15:37   Dg Chest Port 1 View  Result Date: 07/15/2019 CLINICAL DATA:  Pneumonia. EXAM: PORTABLE CHEST 1 VIEW COMPARISON:  07/13/2019. FINDINGS: Mediastinum and hilar structures normal. Heart size stable. Patchy multifocal bilateral pulmonary infiltrates are again noted. These have progressed slightly from prior exam. Small bilateral pleural effusions. No pneumothorax. Right humerus plate and screw fixation. IMPRESSION: Interim slight progression of patchy multifocal bilateral pulmonary infiltrates. Small bilateral pleural effusions. Electronically Signed   By: Marcello Moores  Register   On: 07/15/2019 09:02    Darliss Cheney, MD [facility]  To contact the attending provider between 7A-7P or the covering provider during after hours 7P-7A, please log into the web site [email or web address] and access using universal [place] password for that web site. If you do not have the password, please call the hospital operator.   LOS: 5 days

## 2019-07-15 NOTE — Progress Notes (Signed)
Physical Therapy Treatment Patient Details Name: Melissa [name] MRN: [id number] DOB: [birth year] Today's Date: 07/15/2019    History of Present Illness Pt adm with acute hypoxic respiratory failure due to bronchopneumonia. PMH - htn, dm, afib, chf, ckd, asthma, arthritis,     PT Comments    Pt with great improvement over past two days. Able to amb in hall on O2 with minimal dyspnea and SpO2 >95%. At rest on RA SpO2 to 86%. If daughter willing and able to provide assist for OOB activities at home pt can return home with her. Daughter's husband passed away yesterday and she had been caring for him. If daughter is unable to provide care then pt would likely need SNF.    Follow Up Recommendations  Home health PT;Supervision for mobility/OOB(as long as daughter can provide assist)     Equipment Recommendations  None recommended by PT    Recommendations for Other Services       Precautions / Restrictions Precautions Precautions: Fall;Other (comment) Precaution Comments: watch SpO2 Restrictions Weight Bearing Restrictions: No    Mobility  Bed Mobility Overal bed mobility: Needs Assistance Bed Mobility: Supine to Sit     Supine to sit: Supervision     General bed mobility comments: assist for lines  Transfers Overall transfer level: Needs assistance Equipment used: 4-wheeled walker Transfers: Sit to/from Stand Sit to Stand: Min guard;+2 safety/equipment Stand pivot transfers: Min guard       General transfer comment: Assist for safety and lines  Ambulation/Gait Ambulation/Gait assistance: Min guard Gait Distance (Feet): 140 Feet Assistive device: 4-wheeled walker Gait Pattern/deviations: Step-through pattern;Decreased stride length;Trunk flexed Gait velocity: decr Gait velocity interpretation: <1.31 ft/sec, indicative of household ambulator General Gait Details: Assist for safety and lines. Amb on 2L of O2 with SpO2 >95%   Stairs             Wheelchair  Mobility    Modified Rankin (Stroke Patients Only)       Balance Overall balance assessment: Needs assistance Sitting-balance support: No upper extremity supported;Feet supported Sitting balance-Leahy Scale: Fair     Standing balance support: Bilateral upper extremity supported Standing balance-Leahy Scale: Poor Standing balance comment: rollator and min guard for static standing                            Cognition Arousal/Alertness: Awake/alert Behavior During Therapy: WFL for tasks assessed/performed Overall Cognitive Status: Within Functional Limits for tasks assessed                                        Exercises      General Comments General comments (skin integrity, edema, etc.): HR 90's with amb      Pertinent Vitals/Pain Pain Assessment: No/denies pain    Home Living                      Prior Function            PT Goals (current goals can now be found in the care plan section) Progress towards PT goals: Progressing toward goals    Frequency    Min 3X/week      PT Plan Current plan remains appropriate    Co-evaluation              AM-PAC PT "6 Clicks" Mobility   Outcome Measure  Help needed turning from your back to your side while in a flat bed without using bedrails?: A Little Help needed moving from lying on your back to sitting on the side of a flat bed without using bedrails?: A Little Help needed moving to and from a bed to a chair (including a wheelchair)?: A Little Help needed standing up from a chair using your arms (e.g., wheelchair or bedside chair)?: A Little Help needed to walk in hospital room?: A Little Help needed climbing 3-5 steps with a railing? : A Lot 6 Click Score: 17    End of Session Equipment Utilized During Treatment: Gait belt;Oxygen Activity Tolerance: Patient tolerated treatment well Patient left: in chair;with call bell/phone within reach;with nursing/sitter in  room Nurse Communication: Mobility status PT Visit Diagnosis: Unsteadiness on feet (R26.81);Muscle weakness (generalized) (M62.81)     Time: 6002-9847 PT Time Calculation (min) (ACUTE ONLY): 26 min  Charges:  $Gait Training: 23-37 mins                     [place] Pager [phone] Office [place] 07/15/2019, 5:11 PM

## 2019-07-15 NOTE — Discharge Instructions (Signed)

## 2019-07-15 NOTE — Significant Event (Addendum)
Rapid Response Event Note  Overview: Follow Up - Cardiac - AF  Initial Focused Assessment: I came by to Ms. Wieting this morning as a follow -up. Patient had AF RVR last night coupled with some soft BPs. She was transitioned off a Cardizem infusion and placed on an Amiodarone Infusion. Patient received a 150 mg bolus and is currently on the 60 mg (6HR) load dose. HR 120s-140s at times, per nurse BPs have been soft at times, currently BP 102/58(72) HR 126, RR 17-20, 98% on 3L [place]. Patient was sleeping when I walked in, quickly woke to my voice, she recognized me from seeing me on Monday. Today, however she looks much more fatigued this morning. Patient reports her cough is better, lung sounds - clear in the bilateral upper fields, diminished in the bilateral lower fields, skin warm and dry, trace edema in all extremities. Per nurse, patient has been slightly disoriented for her, but when I saw her was oriented.   Interventions: -- None --  Plan of Care: -- RN to give scheduled/ordered Metoprolol 12.5 XL -- hopefully HR can be rate controlled, perhaps that help with recuperating a BP. -- Given her EF and HF history, patient is on Lasix 20 mg currently, I glanced at the CXR from this morning - CXR -  interim slight progression of patchy multifocal bilateral pulmonary infiltrates and small bilateral pleural effusions.  -- Monitor BP/HR - watch closely given patient's HF and age -- Encourage OOB to chair -- Aspiration Precautions -- Delirium Precautions -- Encourage IS/CDB   Event Summary:  Start Time 0900 End Time 0925   Jobanny Mavis R

## 2019-07-15 NOTE — Progress Notes (Signed)
NAME:  Melissa [name], MRN:  [id number], DOB:  25-Dec-1927, LOS: 5 ADMISSION DATE:  07/10/2019, CONSULTATION DATE:   REFERRING MD: Doristine Bosworth , CHIEF COMPLAINT: New hypoxic respiratory failure  Brief History   85 year old female patient with multiple medical comorbidities most importantly including history of bronchiectasis (felt either NTM or aspiration related), mild obstructive lung disease with FEV1 69% with 5% improvement following bronchodilators and systolic cardiomyopathy with EF 20%.  Admitted 9/26 with new hypoxic respiratory failure and working diagnosis of community-acquired pneumonia versus aspiration pulmonary asked to see on 9/30 for ongoing supplemental oxygen needs.   Past Medical History  Recurrent pneumonia, chronic cough, bronchiectasis felt possibly either secondary to MAI or chronic aspiration, dysphasia is identified by modified barium swallow August 2019, mild obstructive disease with FEV1 69%, improved to 74% with bronchodilators by spirometry September 2016, and chronic systolic heart failure with ejection fraction of 20%, hypertension, atrial fibrillation on Eliquis, diabetes  Significant Hospital Events   9/26 admitted. Started on oxygen azith and rocephin 9/27 feeling a little better but still short of breath with activity still having cough 9/28: Antibiotics broadened, placed on Zosyn instead of Rocephin to cover for possible aspiration 9/29: Still short of breath down to 3 L nasal cannula working with physical therapy, 9/30: Pulmonary consulted as patient still oxygen dependent.  Consults:  Pulmonary consult on 9/30  Procedures:    Significant Diagnostic Tests:    Micro Data:  Covid 19 9/26: neg Sputum culture 9/27: Normal flora Respiratory viral panel 9/30:neg Urine strep antigen 9/30 Urine Legionella antigen 9/30 Antimicrobials:  azithromycin 9/26>>> Rocephin 9/26 through 9/27 Zosyn 9/27>>>9/30 augmentin 9/20>>>  Interim history/subjective:  Feels  better but tired.   Objective   Blood pressure (Abnormal) 102/58, pulse (Abnormal) 126, temperature 98.3 F (36.8 C), temperature source Oral, resp. rate 17, height 5\' 5"  (1.651 m), weight 68.2 kg, SpO2 99 %.       No intake or output data in the 24 hours ending 07/15/19 1105 Filed Weights   07/11/19 1415  Weight: 68.2 kg    Examination:  General frail 85 year old female resting in bed. No distress but does seem more fatigued.  HENT NCAT no JVD MMM  pulm diffuse posterior rales no accessory use Card regular irreg   abd not tender Ext warm and dry; trace LE edema Neuro intact  Resolved Hospital Problem list     Assessment & Plan:   Acute on chronic hypoxic respiratory failure CAP (NOS) bronchiectasis  Dysphagia  Obstructive lung disease w/ BD response  Systolic CM (EF 85%) PAF (on BB and eliquis)  CKD stage III DM type II HL Hypothyroidism Chronic back pain  Anemia of chronic disease Fluid and electrolyte imbalance  Recent death in family.     Acute on chronic hypoxic respiratory failure in the setting of bilateral patchy pulmonary infiltrates with known history of bronchiectasis and mild obstructive lung disease w/ + BD response  -Differential diagnosis includes: Atypical infection, viral pneumonitis, also consider element of pulmonary edema PCXR mild worsening of diffuse patchy infiltrate  Oxygen requirements actually improved.  -RVP neg  -I wonder if cxr change reflects edema more than infection at this point especially w/ AF last night  Plan/recommendation F/u pending AFBs 2 more days of augmentin Cont oral lasix Cont scheduled BD and ICS Cont flutter Am cxr   Systolic CM (EF 63%) w/ h/o PAF; Had recurrent RVR 9/29 -currently NSR w/ last QTc 488 Plan Cont BB Cont lasix Tele DOAC  All other issues per IM   Erick Colace ACNP-BC [place] Pager # [phone] OR # [phone] if no answer

## 2019-07-15 NOTE — Progress Notes (Addendum)
Rapid Response Event:   Called about a MEWS of 4. RN stated pt was A&O, no distress or complaints at this time. Pt with hx of Afib, per RN she flipped back into afib with rate 130-150. Awaiting cardizem gtt from pharmacy. RN stated she did not need me to come see pt at this time, just making aware of MEWS. Will follow up and assess when finished with other call.  Vital Signs MEWS/VS Documentation      07/15/2019 0412 07/15/2019 0427 07/15/2019 0447 07/15/2019 0459   MEWS Score:  4  4  4  5    MEWS Score Color:  Red  Red  Red  Red   Resp:  (!) 24  (!) 25  19  (!) 21   Pulse:  (!) 137  (!) 123  (!) 150  (!) 129   BP:  106/84  (!) 94/57  98/64  (!) 94/49   Temp:  98.5 F (36.9 C)  98.3 F (36.8 C)  98.4 F (36.9 C)  98.3 F (36.8 C)   O2 Device:  Nasal Cannula  Nasal Cannula  Nasal Cannula  Nasal Cannula   O2 Flow Rate (L/min):  3 L/min  3 L/min  3 L/min  3 L/min   Level of Consciousness:  Alert  -  -  -      0515: Went to assess pt, who was sleeping comfortably in bed. HR 133, BP 112/57, RR 22, spO2 99% on 3L. Cardizem gtt at 5mg . EKG ordered. Cardizem switched for Amio gtt.      Sherilyn Dacosta 07/15/2019,5:05 AM

## 2019-07-16 ENCOUNTER — Inpatient Hospital Stay (HOSPITAL_COMMUNITY): Payer: Medicare Other

## 2019-07-16 DIAGNOSIS — I4891 Unspecified atrial fibrillation: Secondary | ICD-10-CM

## 2019-07-16 DIAGNOSIS — I340 Nonrheumatic mitral (valve) insufficiency: Secondary | ICD-10-CM

## 2019-07-16 LAB — CBC WITH DIFFERENTIAL/PLATELET
Abs Immature Granulocytes: 0.68 10*3/uL — ABNORMAL HIGH (ref 0.00–0.07)
Basophils Absolute: 0.1 10*3/uL (ref 0.0–0.1)
Basophils Relative: 1 %
Eosinophils Absolute: 0.3 10*3/uL (ref 0.0–0.5)
Eosinophils Relative: 2 %
HCT: 32.6 % — ABNORMAL LOW (ref 36.0–46.0)
Hemoglobin: 10.2 g/dL — ABNORMAL LOW (ref 12.0–15.0)
Immature Granulocytes: 5 %
Lymphocytes Relative: 12 %
Lymphs Abs: 1.5 10*3/uL (ref 0.7–4.0)
MCH: 28.7 pg (ref 26.0–34.0)
MCHC: 31.3 g/dL (ref 30.0–36.0)
MCV: 91.6 fL (ref 80.0–100.0)
Monocytes Absolute: 0.9 10*3/uL (ref 0.1–1.0)
Monocytes Relative: 7 %
Neutro Abs: 9.2 10*3/uL — ABNORMAL HIGH (ref 1.7–7.7)
Neutrophils Relative %: 73 %
Platelets: 325 10*3/uL (ref 150–400)
RBC: 3.56 MIL/uL — ABNORMAL LOW (ref 3.87–5.11)
RDW: 13.9 % (ref 11.5–15.5)
WBC: 12.7 10*3/uL — ABNORMAL HIGH (ref 4.0–10.5)
nRBC: 0 % (ref 0.0–0.2)

## 2019-07-16 LAB — BASIC METABOLIC PANEL
Anion gap: 11 (ref 5–15)
BUN: 10 mg/dL (ref 8–23)
CO2: 32 mmol/L (ref 22–32)
Calcium: 8.1 mg/dL — ABNORMAL LOW (ref 8.9–10.3)
Chloride: 92 mmol/L — ABNORMAL LOW (ref 98–111)
Creatinine, Ser: 1.18 mg/dL — ABNORMAL HIGH (ref 0.44–1.00)
GFR calc Af Amer: 47 mL/min — ABNORMAL LOW (ref >60)
GFR calc non Af Amer: 41 mL/min — ABNORMAL LOW (ref >60)
Glucose, Bld: 103 mg/dL — ABNORMAL HIGH (ref 70–99)
Potassium: 4.1 mmol/L (ref 3.5–5.1)
Sodium: 135 mmol/L (ref 135–145)

## 2019-07-16 LAB — ECHOCARDIOGRAM COMPLETE
Height: 65 in
Weight: 2406.4 oz

## 2019-07-16 LAB — GLUCOSE, CAPILLARY
Glucose-Capillary: 111 mg/dL — ABNORMAL HIGH (ref 70–99)
Glucose-Capillary: 115 mg/dL — ABNORMAL HIGH (ref 70–99)
Glucose-Capillary: 155 mg/dL — ABNORMAL HIGH (ref 70–99)
Glucose-Capillary: 137 mg/dL — ABNORMAL HIGH (ref 70–99)

## 2019-07-16 MED ORDER — AMIODARONE HCL 200 MG PO TABS: 400.0000 mg | ORAL_TABLET | Freq: Two times a day (BID) | ORAL | Status: DC

## 2019-07-16 MED ORDER — UMECLIDINIUM BROMIDE 62.5 MCG/INH IN AEPB
1.0000 | INHALATION_SPRAY | Freq: Every day | RESPIRATORY_TRACT | Status: DC
  Administered 2019-07-17 – 2019-07-27 (×9): 1 via RESPIRATORY_TRACT
  Filled 2019-07-16 (×3): qty 7

## 2019-07-16 MED ORDER — AMIODARONE HCL 200 MG PO TABS: 200.0000 mg | ORAL_TABLET | Freq: Every day | ORAL | Status: DC

## 2019-07-16 MED ORDER — AMIODARONE HCL 200 MG PO TABS
200.0000 mg | ORAL_TABLET | Freq: Two times a day (BID) | ORAL | Status: DC
  Administered 2019-07-16 – 2019-07-27 (×23): 200 mg via ORAL
  Filled 2019-07-16 (×23): qty 1

## 2019-07-16 NOTE — TOC Initial Note (Signed)
Transition of Care [facility]) - Initial/Assessment Note    Patient Details  Name: Melissa [name] MRN: [id number] Date of Birth: 1928-09-13  Transition of Care [facility]) CM/SW Contact:    Gelene Mink, [place] Phone Number: 07/16/2019, 5:32 PM  Clinical Narrative:                  CSW met with the patient at bedside. CSW introduced herself and explained her role. CSW shared the therapy recommendation. The patient would like to return home with home health. The patient's daughter would be able to provide supervision. The patient stated she has a rolling walker at home.   CSW called Georgina Snell with [facility]. They are able to accept the patient with PT and OT. The patient will need home health orders. No equipment is needed.   CSW will continue to assist as needed.   Expected Discharge Plan: [place] Barriers to Discharge: Continued Medical Work up   Patient Goals and CMS Choice Patient states their goals for this hospitalization and ongoing recovery are:: Pt would like to return home CMS Medicare.gov Compare Post Acute Care list provided to:: Patient Choice offered to / list presented to : Patient  Expected Discharge Plan and Services Expected Discharge Plan: [place] In-house Referral: Clinical Social Work   Post Acute Care Choice: [place] arrangements for the past 2 months: [place]: PT, OT [place] Agency: [place] Date [place]: 07/16/19 Time [place]: 1732 Representative spoke with at [place]: Georgina Snell  Prior Living Arrangements/Services Living arrangements for the past 2 months: [place] Lives with:: Self Patient language and need for interpreter reviewed:: No Do you feel safe going back to the place where you live?: Yes      Need for Family Participation in Patient Care: Yes (Comment) Care giver support system in place?: Yes (comment)    Criminal Activity/Legal Involvement Pertinent to Current Situation/Hospitalization: No - Comment as needed  Activities of Daily Living Home Assistive Devices/Equipment: Walker (specify type) ADL Screening (condition at time of admission) Patient's cognitive ability adequate to safely complete daily activities?: No Is the patient deaf or have difficulty hearing?: Yes Does the patient have difficulty seeing, even when wearing glasses/contacts?: No Does the patient have difficulty concentrating, remembering, or making decisions?: No Patient able to express need for assistance with ADLs?: Yes Does the patient have difficulty dressing or bathing?: No Independently performs ADLs?: Yes (appropriate for developmental age) Does the patient have difficulty walking or climbing stairs?: Yes Weakness of Legs: None Weakness of Arms/Hands: None  Permission Sought/Granted Permission sought to share information with : Case Manager Permission granted to share information with : Yes, Verbal Permission Granted  Share Information with NAME: [place]  Permission granted to share info w AGENCY: [place] granted to share info w Relationship: Granddaughter  Permission granted to share info w Contact Information: [phone]  Emotional Assessment Appearance:: Appears stated age Attitude/Demeanor/Rapport: Engaged Affect (typically observed): Calm Orientation: : Oriented to Self, Oriented to Place, Oriented to  Time, Oriented to Situation Alcohol / Substance Use: Not Applicable Psych Involvement: No (comment)  Admission diagnosis:  Pneumonia due to infectious organism, unspecified laterality, unspecified part of lung [J18.9] Acute respiratory failure with hypoxia ([place]) [J96.01] Patient Active  Problem List   Diagnosis Date Noted  . Atrial fibrillation with RVR ([place])   . Acute respiratory failure with hypoxia ([place]) 07/11/2019  . Lobar pneumonia ([place]) 07/11/2019  .  Bronchopneumonia 07/10/2019  . Abnormal chest CT 04/07/2018  . RLS (restless legs syndrome) 08/05/2017  . Closed fracture of right proximal humerus 06/05/2017  . CKD (chronic kidney disease), stage III 06/05/2017  . High cholesterol 06/05/2017  . Hypertension 06/05/2017  . NICM (nonischemic cardiomyopathy) ([place]) 06/05/2017  . Type II diabetes mellitus ([place]) 06/05/2017  . Anemia 06/05/2017  . Diabetes mellitus with complication ([place])   . Acute on chronic systolic heart failure ([place]) 10/23/2016  . Viral bronchitis   . Dyspnea 10/20/2016  . Influenza-like illness 10/19/2016  . Chronic systolic heart failure ([place]) 10/19/2016  . Symptomatic cholelithiasis 01/24/2016  . 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 Chest pain with moderate risk of acute coronary syndrome 11/14/2015  . Left upper extremity numbness   . Paresthesia 08/22/2015  . Tremor, essential 08/22/2015  . Paroxysmal atrial fibrillation ([place]) 03/06/2015  . Chronic anticoagulation 03/06/2015  . Orthostatic hypotension 03/06/2015  . Essential hypertension 04/19/2014  . Chronic kidney disease, stage III (moderate) ([place]) 04/19/2014  . Hypothyroidism, acquired 06/15/2013  . Hypothyroidism 06/15/2013  . Normocytic anemia 06/10/2013  . Left ventricular dysfunction 09/26/2011  . Asthma exacerbation 09/23/2011  . Type 2 diabetes mellitus with diabetic neuropathy, with long-term current use of insulin ([place]) 09/22/2011  . HLD (hyperlipidemia) 09/22/2011   PCP:  [name], MD Pharmacy:   [id number] [place] [id number]: [phone]: [phone] [facility] [address]SR[address]Phone: [phone]: [phone]    Social Determinants of Health (SDOH) Interventions    Readmission Risk  Interventions No flowsheet data found.

## 2019-07-16 NOTE — Progress Notes (Signed)
NAME:  Melissa [name], MRN:  [id number], DOB:  01/04/1928, LOS: 6 ADMISSION DATE:  07/10/2019, CONSULTATION DATE:   REFERRING MD: Doristine Bosworth , CHIEF COMPLAINT: New hypoxic respiratory failure  Brief History   85 year old female patient with multiple medical comorbidities most importantly including history of bronchiectasis (felt either NTM or aspiration related), mild obstructive lung disease with FEV1 69% with 5% improvement following bronchodilators and systolic cardiomyopathy with EF 20%.  Admitted 9/26 with new hypoxic respiratory failure and working diagnosis of community-acquired pneumonia versus aspiration pulmonary asked to see on 9/30 for ongoing supplemental oxygen needs.   Past Medical History  Recurrent pneumonia, chronic cough, bronchiectasis felt possibly either secondary to MAI or chronic aspiration, dysphasia is identified by modified barium swallow August 2019, mild obstructive disease with FEV1 69%, improved to 74% with bronchodilators by spirometry September 2016, and chronic systolic heart failure with ejection fraction of 20%, hypertension, atrial fibrillation on Eliquis, diabetes  Significant Hospital Events   9/26 admitted. Started on oxygen azith and rocephin 9/27 feeling a little better but still short of breath with activity still having cough 9/28: Antibiotics broadened, placed on Zosyn instead of Rocephin to cover for possible aspiration 9/29: Still short of breath down to 3 L nasal cannula working with physical therapy, 9/30: Pulmonary consulted as patient still oxygen dependent.  Consults:  Pulmonary consult on 9/30  Procedures:    Significant Diagnostic Tests:    Micro Data:  Covid 19 9/26: neg Sputum culture 9/27: Normal flora Respiratory viral panel 9/30:neg Urine strep antigen 9/30 Urine Legionella antigen 9/30 Antimicrobials:  azithromycin 9/26>>> Rocephin 9/26 through 9/27 Zosyn 9/27>>>9/30 augmentin 9/20>>>  Interim history/subjective:  Still  very fatigued but thinks she is breathing easier.   Objective   Blood pressure (Abnormal) 132/45, pulse (Abnormal) 108, temperature 99.3 F (37.4 C), temperature source Axillary, resp. rate (Abnormal) 23, height 5\' 5"  (1.651 m), weight 68.2 kg, SpO2 98 %.        Intake/Output Summary (Last 24 hours) at 07/16/2019 2683 Last data filed at 07/16/2019 4196 Gross per 24 hour  Intake 706.84 ml  Output 450 ml  Net 256.84 ml   Filed Weights   07/11/19 1415  Weight: 68.2 kg    Examination:  General this is a frail 85 year old female she is currently resting in bed and in no acute distress today her oxygen requirements remain at 2 L however saturations are 97 to 98% on this HEENT normocephalic atraumatic no jugular venous distention appreciated mucous membranes are moist sclera are nonicteric Pulmonary: Diffuse scattered rales predominantly posterior no accessory use currently Cardiac: Regular regular currently normal sinus rhythm Abdomen: Soft nontender no organomegaly positive bowel sounds tolerating diet Extremities: Warm dry brisk cap refill strong pulses trace lower extremity edema Neuro: Awake oriented no focal deficits, does seem withdrawn GU: Voids spontaneously  Resolved Hospital Problem list     Assessment & Plan:   Acute on chronic hypoxic respiratory failure CAP (NOS) bronchiectasis  Dysphagia  Obstructive lung disease w/ BD response  Systolic CM (EF 22%) PAF (on BB and eliquis)  CKD stage III DM type II HL Hypothyroidism Chronic back pain  Anemia of chronic disease Fluid and electrolyte imbalance  Recent death in family.     Acute on chronic hypoxic respiratory failure in the setting of bilateral patchy pulmonary infiltrates with known history of bronchiectasis and mild obstructive lung disease w/ + BD response  -Differential diagnosis includes: Atypical infection, viral pneumonitis, also consider element of pulmonary edema Oxygen requirements  actually  improved.  -RVP neg  -Oxygen requirements continued to improve Plan/recommendation Complete 1 more day of Augmentin Wean oxygen Follow-up AF base Continue daily Lasix 20 mg a day Awaiting follow-up chest x-ray  Systolic CM (EF 58%) w/ h/o PAF; Had recurrent RVR 9/29 -currently NSR w/ last QTc 488 Plan Per cardiology    All other issues per IM   We will be available as needed over the course of the weekend please call if we can be of assist  Erick Colace ACNP-BC [place] Pager # [phone] OR # [phone] if no answer

## 2019-07-16 NOTE — Progress Notes (Addendum)
Progress Note  Patient Name: Melissa [name] Date of Encounter: 07/16/2019  Primary Cardiologist: [name], MD   Subjective   Reports dyspnea improving.  Has remained in NSR on amio gtt, though continues to have frequent PACs/PVCs.  Inpatient Medications    Scheduled Meds: . amoxicillin-clavulanate  1 tablet Oral BID  . apixaban  5 mg Oral BID  . atorvastatin  40 mg Oral Daily  . budesonide (PULMICORT) nebulizer solution  0.25 mg Nebulization BID  . cholecalciferol  1,000 Units Oral Daily  . FLUoxetine  20 mg Oral Daily  . furosemide  20 mg Oral Daily  . influenza vaccine adjuvanted  0.5 mL Intramuscular Tomorrow-1000  . insulin aspart  0-5 Units Subcutaneous QHS  . insulin aspart  0-9 Units Subcutaneous TID WC  . insulin detemir  12 Units Subcutaneous QHS  . ipratropium  0.5 mg Nebulization TID  . levalbuterol  1.25 mg Nebulization TID  . levothyroxine  50 mcg Oral Daily  . lidocaine  1 patch Transdermal Q24H  . metoprolol succinate  12.5 mg Oral Daily  . multivitamin  1 tablet Oral BID  . multivitamins with iron  1 tablet Oral Daily  . omega-3 acid ethyl esters  1,000 mg Oral Daily  . pantoprazole  40 mg Oral Daily  . pramipexole  0.75 mg Oral QHS  . pregabalin  50 mg Oral TID  . traZODone  100 mg Oral QHS   Continuous Infusions: . amiodarone 30 mg/hr (07/16/19 0916)   PRN Meds: acetaminophen, guaiFENesin, HYDROcodone-homatropine, levalbuterol, ondansetron (ZOFRAN) IV   Vital Signs    Vitals:   07/16/19 0800 07/16/19 0815 07/16/19 0830 07/16/19 0900  BP: (!) 143/64  126/86 (!) 132/45  Pulse:  (!) 108    Resp: (!) 21  20 (!) 23  Temp:    99.3 F (37.4 C)  TempSrc:    Axillary  SpO2: 98%  98% 98%  Weight:      Height:        Intake/Output Summary (Last 24 hours) at 07/16/2019 0931 Last data filed at 07/16/2019 0655 Gross per 24 hour  Intake 706.84 ml  Output 450 ml  Net 256.84 ml   Last 3 Weights 07/11/2019 03/01/2019 12/03/2018  Weight (lbs) 150 lb  6.4 oz 152 lb 157 lb 12.8 oz  Weight (kg) 68.221 kg 68.947 kg 71.578 kg      Telemetry    Sinus rhythm with frequent PACs, PVCs - Personally Reviewed  ECG    No new ECG - Personally Reviewed  Physical Exam   GEN: No acute distress.   Neck: + JVD Cardiac: RRR, no murmurs, rubs, or gallops.  Respiratory: Clear to auscultation bilaterally. GI: Soft, nontender, non-distended  MS: No edema; No deformity. Neuro:  Nonfocal  Psych: Normal affect   Labs    High Sensitivity Troponin:  No results for input(s): TROPONINIHS in the last 720 hours.    Chemistry Recent Labs  Lab 07/10/19 1503  07/14/19 0543 07/15/19 0444 07/16/19 0456  NA 136   < > 135 135 135  K 3.5   < > 4.3 3.6 4.1  CL 99   < > 99 93* 92*  CO2 26   < > 27 31 32  GLUCOSE 188*   < > 114* 110* 103*  BUN 23   < > 10 10 10   CREATININE 1.55*   < > 1.23* 1.31* 1.18*  CALCIUM 8.4*   < > 8.1* 8.3* 8.1*  PROT 7.0  --   --   --   --  ALBUMIN 2.7*  --   --   --   --   AST 19  --   --   --   --   ALT 14  --   --   --   --   ALKPHOS 89  --   --   --   --   BILITOT 0.5  --   --   --   --   GFRNONAA 29*   < > 39* 36* 41*  GFRAA 34*   < > 45* 42* 47*  ANIONGAP 11   < > 9 11 11    < > = values in this interval not displayed.     Hematology Recent Labs  Lab 07/14/19 0543 07/15/19 0444 07/16/19 0456  WBC 11.5* 14.1* 12.7*  RBC 3.62* 3.72* 3.56*  HGB 10.7* 10.6* 10.2*  HCT 33.0* 33.8* 32.6*  MCV 91.2 90.9 91.6  MCH 29.6 28.5 28.7  MCHC 32.4 31.4 31.3  RDW 14.0 13.8 13.9  PLT 281 340 325    BNP Recent Labs  Lab 07/10/19 1503  BNP 234.4*     DDimer No results for input(s): DDIMER in the last 168 hours.   Radiology    Dg Chest Port 1 View  Result Date: 07/15/2019 CLINICAL DATA:  Pneumonia. EXAM: PORTABLE CHEST 1 VIEW COMPARISON:  07/13/2019. FINDINGS: Mediastinum and hilar structures normal. Heart size stable. Patchy multifocal bilateral pulmonary infiltrates are again noted. These have progressed  slightly from prior exam. Small bilateral pleural effusions. No pneumothorax. Right humerus plate and screw fixation. IMPRESSION: Interim slight progression of patchy multifocal bilateral pulmonary infiltrates. Small bilateral pleural effusions. Electronically Signed   By: Marcello Moores  Register   On: 07/15/2019 09:02    Cardiac Studies   TTE 10/21/16: - Left ventricle: LVEF is severely depressed at approximately 15 to   20% The cavity size was normal. Wall thickness was normal. - Mitral valve: Calcified annulus. Mildly thickened leaflets .  Patient Profile     85 y.o. female with hx of with chronic systolic heart failure (LVEFof 15 to 20%-nonischemic based on negative stress test), paroxysmal atrial fibrillation on anticoagulation with Eliquis (2016), hypertension, DM2, HLD, prior TIA, and hx of orthostatic hypotension secondary to ACE/ARB who is being seen today for the evaluation of atrial fibrillation with RVR  Assessment & Plan    Atrial fibrillation with RVR: history of PAF.  On Eliquis 5 mg BID.  CHADS-VASc 8.  On 10/1, went into AF with RVR with rates 130-150s.  Did not tolerate diltiazem due to soft BP.  Started amio gtt and converted to NSR -  Would continue amio load during acute illness to maintain NSR, as was difficult to rate control in AF due to soft blood pressures.  Will transition from IV amio to PO amio 200 mg BID x 2 week, then drop to 200 mg daily.  Likely not a great candidate for long-term amio given underlying lung disease, can consider stopping amio once through acute illness -Continue anticoagulation with Eliquis 5mg  BID  Acute respiratory failure with hypoxia secondary to CAP/aspiration: history of recurrent bronchial pneumonia.  She is followed outpatient with pulmonary medicine, Dr. [name].  Previous high-resolution CT 06/2018 which showed bronchiectatic changes and scarring with concern for possible MAI versus COPD versus recurrent aspiration. On augmentin.    Chronic  systolic heart failure: Echocardiogram 10/31/2016 with LVEF of 15 to 20%.  Presumed nonischemic with negative stress test.  Managed with p.o. Lasix, metoprolol succinate.  Previously unable  to utilize ACE inhibitor/ARNI secondary to hypotension -Continue Toprol  -Home dose Lasix 20mg  currently on hold due to BP yesterday -Will check TTE  CKD stage III: creatinine improved to 1.18 today,  Baseline appears to be in the 1.2-1.5 range  HLD: Last LDL, 54.  Continue statin, Lovaza  DM2: Hemoglobin A1c, 6.1  For questions or updates, please contact [facility] Please consult [email or web address] for contact info under        Signed, Donato Heinz, MD  07/16/2019, 9:31 AM

## 2019-07-16 NOTE — Progress Notes (Signed)
Occupational Therapy Treatment Patient Details Name: Melissa [name] MRN: [id number] DOB: 01-21-1928 Today's Date: 07/16/2019    History of present illness Pt adm with acute hypoxic respiratory failure due to bronchopneumonia. PMH - htn, dm, afib, chf, ckd, asthma, arthritis,    OT comments  Pt progressing today. Pt with limited activity tolerance and SOB with mobility and exertion. Pt stood at sink for light ADL and pt required directions to be repeated and 2 seated rest breaks due to SOB. Pt performing own toilet hygiene with minA overall. Pt using rollator for mobility in room with forgetfulness to use brakes. Pt had 2 coughing spells 1 after mobility and 1 after using inspiration spirometer. Pt would benefit from continued OT skilled services for ADL, mobility and safety in post acute care setting. OT following acutely.    Follow Up Recommendations  Home health OT;SNF;Supervision/Assistance - 24 hour    Equipment Recommendations  None recommended by OT    Recommendations for Other Services      Precautions / Restrictions Precautions Precautions: Fall;Other (comment) Precaution Comments: watch SpO2 Restrictions Weight Bearing Restrictions: No       Mobility Bed Mobility Overal bed mobility: Needs Assistance Bed Mobility: Supine to Sit     Supine to sit: Supervision     General bed mobility comments: assist for lines  Transfers Overall transfer level: Needs assistance Equipment used: 4-wheeled walker Transfers: Sit to/from Stand Sit to Stand: Min guard Stand pivot transfers: Min guard       General transfer comment: Assist for safety and lines    Balance Overall balance assessment: Needs assistance Sitting-balance support: No upper extremity supported;Feet supported Sitting balance-Leahy Scale: Fair       Standing balance-Leahy Scale: Poor Standing balance comment: rollator and min guard for static standing                           ADL either  performed or assessed with clinical judgement   ADL Overall ADL's : Needs assistance/impaired     Grooming: Set up;Min guard;Sitting                   Toilet Transfer: Minimal assistance;Ambulation;BSC   Toileting- Clothing Manipulation and Hygiene: Minimal assistance;Cueing for safety;Cueing for sequencing;Sitting/lateral lean;Sit to/from stand       Functional mobility during ADLs: Min guard;Cueing for safety General ADL Comments: Pt with limited activity tolerance and SOB with mobility and exertion. Pt stood at sink for light ADL and pt required directions to be repeated and 2 seated rest breaks     Vision   Vision Assessment?: No apparent visual deficits   Perception     Praxis      Cognition Arousal/Alertness: Awake/alert Behavior During Therapy: WFL for tasks assessed/performed Overall Cognitive Status: Within Functional Limits for tasks assessed Area of Impairment: Problem solving;Memory;Following commands                     Memory: Decreased short-term memory Following Commands: Follows one step commands with increased time     Problem Solving: Slow processing General Comments: alert and following commands; required redirection        Exercises     Shoulder Instructions       General Comments      Pertinent Vitals/ Pain       Pain Assessment: Faces Faces Pain Scale: No hurt Pain Descriptors / Indicators: Sore;Heaviness Pain Intervention(s): Limited activity within patient's tolerance  Home Living                                          Prior Functioning/Environment              Frequency  Min 2X/week        Progress Toward Goals  OT Goals(current goals can now be found in the care plan section)  Progress towards OT goals: Progressing toward goals  Acute Rehab OT Goals Patient Stated Goal: goal is to return home, she is agreeable to rehab at SNF prior if needed OT Goal Formulation: With  patient Time For Goal Achievement: 07/28/19 Potential to Achieve Goals: Good ADL Goals Pt Will Perform Grooming: with modified independence;sitting;standing Pt Will Perform Upper Body Bathing: with set-up;sitting Pt Will Perform Lower Body Bathing: with modified independence;sit to/from stand Pt Will Perform Upper Body Dressing: with set-up;sitting Pt Will Perform Lower Body Dressing: with supervision;sit to/from stand Pt Will Transfer to Toilet: with supervision;ambulating Pt Will Perform Toileting - Clothing Manipulation and hygiene: with supervision;sit to/from stand Pt Will Perform Tub/Shower Transfer: with supervision;ambulating;rolling walker;3 in 1;shower seat  Plan Discharge plan remains appropriate    Co-evaluation                 AM-PAC OT "6 Clicks" Daily Activity     Outcome Measure   Help from another person eating meals?: None Help from another person taking care of personal grooming?: A Little Help from another person toileting, which includes using toliet, bedpan, or urinal?: A Little Help from another person bathing (including washing, rinsing, drying)?: A Little Help from another person to put on and taking off regular upper body clothing?: A Little Help from another person to put on and taking off regular lower body clothing?: A Little 6 Click Score: 19    End of Session Equipment Utilized During Treatment: Rolling walker;Oxygen  OT Visit Diagnosis: Unsteadiness on feet (R26.81);Muscle weakness (generalized) (M62.81)   Activity Tolerance Patient limited by fatigue   Patient Left in chair;with call bell/phone within reach;with chair alarm set   Nurse Communication Mobility status        Time: 1350-1420 OT Time Calculation (min): 30 min  Charges: OT General Charges $OT Visit: 1 Visit OT Treatments $Self Care/Home Management : 23-37 mins  Ebony Hail Harold Hedge) Marsa Aris OTR/L Acute Rehabilitation Services Pager: [phone] Office:  [phone]   Jenene Slicker Natisha Trzcinski 07/16/2019, 4:00 PM

## 2019-07-16 NOTE — Progress Notes (Addendum)
PROGRESS NOTE  Melissa [name] TKW:[id number] DOB: 10-11-1928 DOA: 07/10/2019 PCP: [name], MD  Brief History:  85 y.o. female with medical history significant for chronic systolic CHF (92%), DM2, HTN, Afib on Eliquis, and HLD who presented to the ED with c/o cough for the past 2 weeks. Pt has a h/o recurrent pneumonia and chronic cough and typically has recurrences about this time every year. She is followed by Dr. [name] in [facility] and was last seen there in July 2019. High res CT in Sept 2019 showed bronchiectatic changes and scarring, with concern for possible MAI vs COP vs recurrent aspiration. She had been doing fairly well since then except for chronic dry cough. 2 weeks PTA cough began to worsen, and became productive of thick mucous. She has had no fevers. She was seen in clinic (in the parking lot) on Tuesday of week PTA and started on a Z pack. Had COVID test on Wed at urgent care which was negative. Despite azithromycin she worsened and was coughing so badly that she was having frequent post-tussive emesis.  Because of worsening cough and shortness of breath, the patient's daughter brought her to the emergency department for further evaluation. She was admitted for Acute respiratory failure d/t bronchopneumonia complicating underlying bronchiectasis.  She was continued on antibiotics however she did not improve.  Repeat chest x-ray on 07/13/2019 showed worsening of bilateral patchy opacities.  PCCM was consulted on 07/14/2019.  Early morning of 07/15/2019, patient went into atrial fibrillation with RVR.  She did not respond to diltiazem bolus and was started on amiodarone drip.  Cardiology was consulted on 07/15/2019.  Assessment/Plan: Acute respiratory failure with hypoxia/bronchopneumonia/bronchiectasis -Secondary to pneumonia complicating underlying bronchiectasis.  Covid negative.  Chest x-ray shows bilateral multiple patchy ill-defined opacities.  Repeat chest x-ray from  07/13/2019 shows worsening patchy bilateral infiltrates.  Seen by PCCM.  Appreciate their help.  She is only on 2 L of nasal oxygen and feels much better today.  Continues to have clear sputum production. MBS on 05/19/2018 showed mild aspiration risk.  Procalcitonin only 0.16.  Zosyn and azithromycin were switched to oral Augmentin by PCCM on 07/14/2019.  Continue Xopenex, Atrovent and Pulmicort.  Slight worsened leukocytosis but she remains afebrile.  Paroxysmal atrial fibrillation Went into rapid ventricular response 2 AM on 07/15/2019.  no results from diltiazem bolus, then drip which caused hypotension so she was started on amiodarone drip.  Cardiology was consulted on 07/15/2019.  She continues to be on amnio drip.  Appreciate cardiology help.  Continue anticoagulation.  Diabetes mellitus type 2 -05/05/2019 hemoglobin A1c 6.1.  Blood sugar very well controlled.  Continue 12 units of Levemir along with SSI.  CKD stage III -Baseline creatinine 1.2-1.5.  Currently at baseline.  Continue to watch.  Chronic systolic CHF -02/07/8340 echo EF 15-20%, trivial MR - Trace ankle edema otherwise appears euvolemic. -Continue home dose of furosemide -Continue metoprolol succinate - Sees [name], Cardiology as OP.  Hyperlipidemia -Continue statin and Lovaza.  Hypothyroidism -Continue Synthroid -July 2020 TSH 1.72  Chronic back pain - Lidocaine patch as per reported home regimen.  Hypomagnesemia - resolved.  Anemia, suspect chronic disease - Follow CBC's.  Stable.  Disposition Plan:  DC home pending clinical improvement, hopefully tomorrow Family Communication: None present at bedside.  Will call him later.  Consultants: PCCM on 07/14/2019  Code Status:  FULL  DVT Prophylaxis:  apixaban  Procedures: None  Antibiotics: Zosyn 9/27>>>  07/14/2019 Oral Augmentin 07/14/2019>   Subjective: Patient seen and examined.  No more tremors.  Feels much better.  Looks comfortable only on 2 L nasal  cannula oxygen.  Objective: Vitals:   07/16/19 0920 07/16/19 0930 07/16/19 0946 07/16/19 1200  BP:  106/70 (!) 130/58 (!) 147/112  Pulse:    (!) 105  Resp: 19 18 20  (!) 23  Temp:    98.6 F (37 C)  TempSrc:    Oral  SpO2: 97% 96% 99% 100%  Weight:      Height:        Intake/Output Summary (Last 24 hours) at 07/16/2019 1231 Last data filed at 07/16/2019 0945 Gross per 24 hour  Intake 751.87 ml  Output 450 ml  Net 301.87 ml   Weight change:  Exam:  General exam: Appears calm and comfortable  Respiratory system: Rhonchi with coarse breath sounds bilaterally, respiratory effort normal. Cardiovascular system: S1 & S2 heard, RRR. No JVD, murmurs, rubs, gallops or clicks. No pedal edema. Gastrointestinal system: Abdomen is nondistended, soft and nontender. No organomegaly or masses felt. Normal bowel sounds heard. Central nervous system: Alert and oriented. No focal neurological deficits. Extremities: Symmetric 5 x 5 power. Skin: No rashes, lesions or ulcers.  Psychiatry: Judgement and insight appear poor.  Mood & affect flat.  Data Reviewed: I have personally reviewed following labs and imaging studies Basic Metabolic Panel: Recent Labs  Lab 07/12/19 0439 07/13/19 0544 07/14/19 0543 07/15/19 0444 07/16/19 0456  NA 138 135 135 135 135  K 3.9 3.9 4.3 3.6 4.1  CL 103 101 99 93* 92*  CO2 27 25 27 31  32  GLUCOSE 113* 96 114* 110* 103*  BUN 18 17 10 10 10   CREATININE 1.38* 1.33* 1.23* 1.31* 1.18*  CALCIUM 7.5* 8.0* 8.1* 8.3* 8.1*  MG 1.2* 1.8  --  1.5*  --    Liver Function Tests: Recent Labs  Lab 07/10/19 1503  AST 19  ALT 14  ALKPHOS 89  BILITOT 0.5  PROT 7.0  ALBUMIN 2.7*   CBC: Recent Labs  Lab 07/10/19 1503  07/12/19 0439 07/13/19 0544 07/14/19 0543 07/15/19 0444 07/16/19 0456  WBC 14.1*   < > 11.7* 11.7* 11.5* 14.1* 12.7*  NEUTROABS 11.1*  --   --   --  9.4* 10.8* 9.2*  HGB 12.3   < > 10.0* 10.6* 10.7* 10.6* 10.2*  HCT 36.7   < > 30.6* 32.9* 33.0*  33.8* 32.6*  MCV 87.6   < > 90.5 91.1 91.2 90.9 91.6  PLT 250   < > 211 261 281 340 325   < > = values in this interval not displayed.   Cardiac Enzymes: No results for input(s): CKTOTAL, CKMB, CKMBINDEX, TROPONINI in the last 168 hours. BNP: Invalid input(s): POCBNP CBG: Recent Labs  Lab 07/15/19 1136 07/15/19 1606 07/15/19 2210 07/16/19 0741 07/16/19 1159  GLUCAP 207* 199* 183* 111* 115*     Recent Results (from the past 240 hour(s))  SARS Coronavirus 2 [facility] order, Performed in [facility] hospital lab) Nasopharyngeal Nasopharyngeal Swab     Status: None   Collection Time: 07/10/19  6:46 PM   Specimen: Nasopharyngeal Swab  Result Value Ref Range Status   SARS Coronavirus 2 NEGATIVE NEGATIVE Final    Comment: (NOTE) If result is NEGATIVE SARS-CoV-2 target nucleic acids are NOT DETECTED. The SARS-CoV-2 RNA is generally detectable in upper and lower  respiratory specimens during the acute phase of infection. The lowest  concentration of SARS-CoV-2 viral  copies this assay can detect is 250  copies / mL. A negative result does not preclude SARS-CoV-2 infection  and should not be used as the sole basis for treatment or other  patient management decisions.  A negative result may occur with  improper specimen collection / handling, submission of specimen other  than nasopharyngeal swab, presence of viral mutation(s) within the  areas targeted by this assay, and inadequate number of viral copies  (<250 copies / mL). A negative result must be combined with clinical  observations, patient history, and epidemiological information. If result is POSITIVE SARS-CoV-2 target nucleic acids are DETECTED. The SARS-CoV-2 RNA is generally detectable in upper and lower  respiratory specimens dur ing the acute phase of infection.  Positive  results are indicative of active infection with SARS-CoV-2.  Clinical  correlation with patient history and other diagnostic information is   necessary to determine patient infection status.  Positive results do  not rule out bacterial infection or co-infection with other viruses. If result is PRESUMPTIVE POSTIVE SARS-CoV-2 nucleic acids MAY BE PRESENT.   A presumptive positive result was obtained on the submitted specimen  and confirmed on repeat testing.  While 2019 novel coronavirus  (SARS-CoV-2) nucleic acids may be present in the submitted sample  additional confirmatory testing may be necessary for epidemiological  and / or clinical management purposes  to differentiate between  SARS-CoV-2 and other Sarbecovirus currently known to infect humans.  If clinically indicated additional testing with an alternate test  methodology [phone]) is advised. The SARS-CoV-2 RNA is generally  detectable in upper and lower respiratory sp ecimens during the acute  phase of infection. The expected result is Negative. Fact Sheet for Patients:  StrictlyIdeas.no Fact Sheet for Healthcare Providers: BankingDealers.co.za This test is not yet approved or cleared by the [place] FDA and has been authorized for detection and/or diagnosis of SARS-CoV-2 by FDA under an Emergency Use Authorization (EUA).  This EUA will remain in effect (meaning this test can be used) for the duration of the COVID-19 declaration under Section 564(b)(1) of the Act, 21 U.S.C. section 360bbb-3(b)(1), unless the authorization is terminated or revoked sooner. Performed at [facility], [place] [address], [place], [place] [address]   Culture, sputum-assessment     Status: None   Collection Time: 07/11/19  1:13 PM   Specimen: Sputum  Result Value Ref Range Status   Specimen Description SPU  Final   Special Requests NONE  Final   Sputum evaluation   Final    THIS SPECIMEN IS ACCEPTABLE FOR SPUTUM CULTURE Performed at [facility], [address] [place], [place] [address]    Report Status 07/11/2019  FINAL  Final  Culture, respiratory     Status: None   Collection Time: 07/11/19  1:13 PM   Specimen: Sputum  Result Value Ref Range Status   Specimen Description SPU  Final   Special Requests NONE Reflexed from [id number]  Final   Gram Stain   Final    ABUNDANT WBC PRESENT, PREDOMINANTLY PMN NO ORGANISMS SEEN    Culture   Final    Consistent with normal respiratory flora. Performed at [facility], [place] [address] [place], [place] [address]    Report Status 07/13/2019 FINAL  Final  Respiratory Panel by PCR     Status: None   Collection Time: 07/14/19  7:35 PM   Specimen: Nasopharyngeal Swab; Respiratory  Result Value Ref Range Status   Adenovirus NOT DETECTED NOT  DETECTED Final   Coronavirus 229E NOT DETECTED NOT DETECTED Final    Comment: (NOTE) The Coronavirus on the Respiratory Panel, DOES NOT test for the novel  Coronavirus (2019 nCoV)    Coronavirus HKU1 NOT DETECTED NOT DETECTED Final   Coronavirus NL63 NOT DETECTED NOT DETECTED Final   Coronavirus OC43 NOT DETECTED NOT DETECTED Final   Metapneumovirus NOT DETECTED NOT DETECTED Final   Rhinovirus / Enterovirus NOT DETECTED NOT DETECTED Final   Influenza A NOT DETECTED NOT DETECTED Final   Influenza B NOT DETECTED NOT DETECTED Final   Parainfluenza Virus 1 NOT DETECTED NOT DETECTED Final   Parainfluenza Virus 2 NOT DETECTED NOT DETECTED Final   Parainfluenza Virus 3 NOT DETECTED NOT DETECTED Final   Parainfluenza Virus 4 NOT DETECTED NOT DETECTED Final   Respiratory Syncytial Virus NOT DETECTED NOT DETECTED Final   Bordetella pertussis NOT DETECTED NOT DETECTED Final   Chlamydophila pneumoniae NOT DETECTED NOT DETECTED Final   Mycoplasma pneumoniae NOT DETECTED NOT DETECTED Final    Comment: Performed at [facility], [place] [address] [place], [place] [address]     Scheduled Meds: . amiodarone  200 mg Oral BID   Followed by  . [START ON 07/30/2019] amiodarone  200 mg Oral Daily  . amoxicillin-clavulanate  1  tablet Oral BID  . apixaban  5 mg Oral BID  . atorvastatin  40 mg Oral Daily  . budesonide (PULMICORT) nebulizer solution  0.25 mg Nebulization BID  . cholecalciferol  1,000 Units Oral Daily  . FLUoxetine  20 mg Oral Daily  . furosemide  20 mg Oral Daily  . influenza vaccine adjuvanted  0.5 mL Intramuscular Tomorrow-1000  . insulin aspart  0-5 Units Subcutaneous QHS  . insulin aspart  0-9 Units Subcutaneous TID WC  . insulin detemir  12 Units Subcutaneous QHS  . ipratropium  0.5 mg Nebulization TID  . levalbuterol  1.25 mg Nebulization TID  . levothyroxine  50 mcg Oral Daily  . lidocaine  1 patch Transdermal Q24H  . metoprolol succinate  12.5 mg Oral Daily  . multivitamin  1 tablet Oral BID  . multivitamins with iron  1 tablet Oral Daily  . omega-3 acid ethyl esters  1,000 mg Oral Daily  . pantoprazole  40 mg Oral Daily  . pramipexole  0.75 mg Oral QHS  . pregabalin  50 mg Oral TID  . traZODone  100 mg Oral QHS   Continuous Infusions:   Procedures/Studies: Dg Chest 2 View  Result Date: 07/13/2019 CLINICAL DATA:  Shortness of breath for 2 weeks. Recent diagnosis of pneumonia. EXAM: CHEST - 2 VIEW COMPARISON:  PA and lateral chest 07/10/2019. FINDINGS: Patchy bilateral airspace disease has worsened, particularly in the left lung base. There is a small left pleural effusion. Heart size is normal. No pneumothorax. Aortic atherosclerosis is noted. IMPRESSION: Worsened patchy bilateral airspace disease most consistent with pneumonia. Atypical/viral infection is possible. New small left pleural effusion. Atherosclerosis. Electronically Signed   By: Inge Rise M.D.   On: 07/13/2019 15:01   Dg Chest 2 View  Result Date: 07/10/2019 CLINICAL DATA:  Shortness of breath. EXAM: CHEST - 2 VIEW COMPARISON:  Radiograph of Feb 27, 2018. CT scan of March 27, 2018 and July 08, 2018. FINDINGS: The heart size and mediastinal contours are within normal limits. No pneumothorax or pleural effusion  is noted. There are multiple ill-defined opacities throughout both lungs, some of which were present on prior exam and some of which are  new. The visualized skeletal structures are unremarkable. IMPRESSION: Multiple ill-defined opacities are noted throughout both lungs which are consistent with recurrent bronchopneumonia as described on prior CT scan. Aortic Atherosclerosis (ICD10-I70.0). Electronically Signed   By: Marijo Conception M.D.   On: 07/10/2019 15:37   Dg Chest Port 1 View  Result Date: 07/16/2019 CLINICAL DATA:  : Pneumonia ,cough EXAM: PORTABLE CHEST 1 VIEW COMPARISON:  Chest radiograph 07/15/2019 FINDINGS: Stable cardiomediastinal contours with mildly enlarged heart size. Persistent bilateral diffuse patchy airspace opacities suspicious for infection. There is mildly increased fullness of the left hilum compared to more remote prior studies. No pneumothorax or large pleural effusion. No acute finding in the visualized skeleton. Surgical hardware noted in the right proximal humerus. IMPRESSION: 1. Persistent bilateral patchy airspace opacities suspicious for infection. 2. Slightly increased fullness of the left hilum compared to more remote prior studies, possibly representing underlying adenopathy. Recommend attention on follow-up. Electronically Signed   By: Audie Pinto M.D.   On: 07/16/2019 09:56   Dg Chest Port 1 View  Result Date: 07/15/2019 CLINICAL DATA:  Pneumonia. EXAM: PORTABLE CHEST 1 VIEW COMPARISON:  07/13/2019. FINDINGS: Mediastinum and hilar structures normal. Heart size stable. Patchy multifocal bilateral pulmonary infiltrates are again noted. These have progressed slightly from prior exam. Small bilateral pleural effusions. No pneumothorax. Right humerus plate and screw fixation. IMPRESSION: Interim slight progression of patchy multifocal bilateral pulmonary infiltrates. Small bilateral pleural effusions. Electronically Signed   By: Marcello Moores  Register   On: 07/15/2019 09:02     Darliss Cheney, MD [facility]  To contact the attending provider between 7A-7P or the covering provider during after hours 7P-7A, please log into the web site [email or web address] and access using universal [place] password for that web site. If you do not have the password, please call the hospital operator.   LOS: 6 days

## 2019-07-16 NOTE — Progress Notes (Signed)
  Echocardiogram 2D Echocardiogram has been performed.  Melissa [name] 07/16/2019, 10:54 AM

## 2019-07-16 NOTE — Progress Notes (Signed)
Amiodarone drip stopped per orders. Pt coughing up thin, clear, oral secretions. Discouraged use of peppermint hard candy , encouraged use of IS, and given cough syrup. VS and assessment noted.

## 2019-07-16 NOTE — Progress Notes (Addendum)
Physical Therapy Treatment Patient Details Name: Melissa [name] MRN: [id number] DOB: 07-13-1928 Today's Date: 07/16/2019    History of Present Illness Pt adm with acute hypoxic respiratory failure due to bronchopneumonia. PMH - htn, dm, afib, chf, ckd, asthma, arthritis,     PT Comments    Pt continues to make steady progress with mobility. Will need assist initially at home with mobility. Pt reports daughter available to assist.    Follow Up Recommendations  Home health PT;Supervision for mobility/OOB(as long as daughter can provide assist)     Equipment Recommendations  None recommended by PT    Recommendations for Other Services       Precautions / Restrictions Precautions Precautions: Fall;Other (comment) Precaution Comments: watch SpO2 Restrictions Weight Bearing Restrictions: No    Mobility  Bed Mobility Overal bed mobility: Needs Assistance Bed Mobility: Supine to Sit     Supine to sit: Supervision     General bed mobility comments: Pt up in chair  Transfers Overall transfer level: Needs assistance Equipment used: 4-wheeled walker Transfers: Sit to/from Stand Sit to Stand: Min guard Stand pivot transfers: Min guard       General transfer comment: Assist for safety and lines  Ambulation/Gait Ambulation/Gait assistance: Min guard Gait Distance (Feet): 140 Feet Assistive device: 4-wheeled walker Gait Pattern/deviations: Step-through pattern;Decreased stride length;Trunk flexed Gait velocity: decr Gait velocity interpretation: <1.31 ft/sec, indicative of household ambulator General Gait Details: Assist for safety and lines.    Stairs             Wheelchair Mobility    Modified Rankin (Stroke Patients Only)       Balance Overall balance assessment: Needs assistance Sitting-balance support: No upper extremity supported;Feet supported Sitting balance-Leahy Scale: Fair     Standing balance support: Bilateral upper extremity  supported Standing balance-Leahy Scale: Poor Standing balance comment: rollator and min guard for static standing                            Cognition Arousal/Alertness: Awake/alert Behavior During Therapy: WFL for tasks assessed/performed Overall Cognitive Status: Within Functional Limits for tasks assessed Area of Impairment: Memory                     Memory: Decreased short-term memory Following Commands: Follows one step commands with increased time     Problem Solving: Requires verbal cues General Comments: alert and following commands; required redirection      Exercises      General Comments        Pertinent Vitals/Pain Pain Assessment: Faces Faces Pain Scale: No hurt    Home Living                      Prior Function            PT Goals (current goals can now be found in the care plan section) Acute Rehab PT Goals Patient Stated Goal: goal is to return home, she is agreeable to rehab at SNF prior if needed Progress towards PT goals: Progressing toward goals    Frequency    Min 3X/week      PT Plan Current plan remains appropriate    Co-evaluation              AM-PAC PT "6 Clicks" Mobility   Outcome Measure  Help needed turning from your back to your side while in a flat bed without using bedrails?: A  Little Help needed moving from lying on your back to sitting on the side of a flat bed without using bedrails?: A Little Help needed moving to and from a bed to a chair (including a wheelchair)?: A Little Help needed standing up from a chair using your arms (e.g., wheelchair or bedside chair)?: A Little Help needed to walk in hospital room?: A Little Help needed climbing 3-5 steps with a railing? : A Lot 6 Click Score: 17    End of Session Equipment Utilized During Treatment: Gait belt;Oxygen Activity Tolerance: Patient tolerated treatment well Patient left: in chair;with call bell/phone within reach;with chair  alarm set   PT Visit Diagnosis: Unsteadiness on feet (R26.81);Muscle weakness (generalized) (M62.81)     Time: 3668-1594 PT Time Calculation (min) (ACUTE ONLY): 17 min  Charges:  $Gait Training: 8-22 mins                     [place] Pager [phone] [place] 07/16/2019, 4:09 PM

## 2019-07-16 NOTE — Plan of Care (Signed)
  Problem: Education: Goal: Knowledge of General Education information will improve Description Including pain rating scale, medication(s)/side effects and non-pharmacologic comfort measures Outcome: Progressing   

## 2019-07-17 LAB — CBC WITH DIFFERENTIAL/PLATELET
Abs Immature Granulocytes: 0.42 10*3/uL — ABNORMAL HIGH (ref 0.00–0.07)
Basophils Absolute: 0 10*3/uL (ref 0.0–0.1)
Basophils Relative: 0 %
Eosinophils Absolute: 0.2 10*3/uL (ref 0.0–0.5)
Eosinophils Relative: 2 %
HCT: 35.1 % — ABNORMAL LOW (ref 36.0–46.0)
Hemoglobin: 11.1 g/dL — ABNORMAL LOW (ref 12.0–15.0)
Immature Granulocytes: 3 %
Lymphocytes Relative: 11 %
Lymphs Abs: 1.5 10*3/uL (ref 0.7–4.0)
MCH: 28.6 pg (ref 26.0–34.0)
MCHC: 31.6 g/dL (ref 30.0–36.0)
MCV: 90.5 fL (ref 80.0–100.0)
Monocytes Absolute: 1.1 10*3/uL — ABNORMAL HIGH (ref 0.1–1.0)
Monocytes Relative: 8 %
Neutro Abs: 10.3 10*3/uL — ABNORMAL HIGH (ref 1.7–7.7)
Neutrophils Relative %: 76 %
Platelets: 365 10*3/uL (ref 150–400)
RBC: 3.88 MIL/uL (ref 3.87–5.11)
RDW: 13.8 % (ref 11.5–15.5)
WBC: 13.6 10*3/uL — ABNORMAL HIGH (ref 4.0–10.5)
nRBC: 0.1 % (ref 0.0–0.2)

## 2019-07-17 LAB — GLUCOSE, CAPILLARY
Glucose-Capillary: 104 mg/dL — ABNORMAL HIGH (ref 70–99)
Glucose-Capillary: 125 mg/dL — ABNORMAL HIGH (ref 70–99)
Glucose-Capillary: 145 mg/dL — ABNORMAL HIGH (ref 70–99)
Glucose-Capillary: 183 mg/dL — ABNORMAL HIGH (ref 70–99)

## 2019-07-17 MED ORDER — PHENOL 1.4 % MT LIQD
1.0000 | OROMUCOSAL | Status: DC | PRN
  Administered 2019-07-17: 1 via OROMUCOSAL
  Filled 2019-07-17: qty 177

## 2019-07-17 NOTE — Progress Notes (Signed)
Progress Note  Patient Name: Melissa [name] Date of Encounter: 07/17/2019  Primary Cardiologist: [name], MD   Subjective   Shortness of breath improving.  Continuing to cough.  Inpatient Medications    Scheduled Meds: . amiodarone  200 mg Oral BID   Followed by  . [START ON 07/30/2019] amiodarone  200 mg Oral Daily  . amoxicillin-clavulanate  1 tablet Oral BID  . apixaban  5 mg Oral BID  . atorvastatin  40 mg Oral Daily  . cholecalciferol  1,000 Units Oral Daily  . FLUoxetine  20 mg Oral Daily  . furosemide  20 mg Oral Daily  . influenza vaccine adjuvanted  0.5 mL Intramuscular Tomorrow-1000  . insulin aspart  0-5 Units Subcutaneous QHS  . insulin aspart  0-9 Units Subcutaneous TID WC  . insulin detemir  12 Units Subcutaneous QHS  . ipratropium  0.5 mg Nebulization TID  . levothyroxine  50 mcg Oral Daily  . lidocaine  1 patch Transdermal Q24H  . metoprolol succinate  12.5 mg Oral Daily  . multivitamin  1 tablet Oral BID  . multivitamins with iron  1 tablet Oral Daily  . omega-3 acid ethyl esters  1,000 mg Oral Daily  . pantoprazole  40 mg Oral Daily  . pramipexole  0.75 mg Oral QHS  . pregabalin  50 mg Oral TID  . traZODone  100 mg Oral QHS  . umeclidinium bromide  1 puff Inhalation Daily   Continuous Infusions:  PRN Meds: acetaminophen, guaiFENesin, HYDROcodone-homatropine, levalbuterol, ondansetron (ZOFRAN) IV, phenol   Vital Signs    Vitals:   07/17/19 0812 07/17/19 0825 07/17/19 0924 07/17/19 1148  BP:  (!) 125/58 121/60 128/62  Pulse:  (!) 102 (!) 109   Resp:  17  (!) 30  Temp:  98.2 F (36.8 C)    TempSrc:  Oral    SpO2: 96% 91%  96%  Weight:      Height:        Intake/Output Summary (Last 24 hours) at 07/17/2019 1246 Last data filed at 07/16/2019 1348 Gross per 24 hour  Intake 120 ml  Output -  Net 120 ml   Last 3 Weights 07/11/2019 03/01/2019 12/03/2018  Weight (lbs) 150 lb 6.4 oz 152 lb 157 lb 12.8 oz  Weight (kg) 68.221 kg 68.947 kg  71.578 kg      Telemetry    Sinus rhythm- Personally Reviewed  ECG    None new- Personally Reviewed  Physical Exam   GEN: Well nourished, well developed, in no acute distress  HEENT: normal  Neck: no JVD, carotid bruits, or masses Cardiac: RRR; no murmurs, rubs, or gallops,no edema  Respiratory:  clear to auscultation bilaterally, normal work of breathing GI: soft, nontender, nondistended, + BS MS: no deformity or atrophy  Skin: warm and dry Neuro:  Strength and sensation are intact Psych: euthymic mood, full affect   Labs    High Sensitivity Troponin:  No results for input(s): TROPONINIHS in the last 720 hours.    Chemistry Recent Labs  Lab 07/10/19 1503  07/14/19 0543 07/15/19 0444 07/16/19 0456  NA 136   < > 135 135 135  K 3.5   < > 4.3 3.6 4.1  CL 99   < > 99 93* 92*  CO2 26   < > 27 31 32  GLUCOSE 188*   < > 114* 110* 103*  BUN 23   < > 10 10 10   CREATININE 1.55*   < >  1.23* 1.31* 1.18*  CALCIUM 8.4*   < > 8.1* 8.3* 8.1*  PROT 7.0  --   --   --   --   ALBUMIN 2.7*  --   --   --   --   AST 19  --   --   --   --   ALT 14  --   --   --   --   ALKPHOS 89  --   --   --   --   BILITOT 0.5  --   --   --   --   GFRNONAA 29*   < > 39* 36* 41*  GFRAA 34*   < > 45* 42* 47*  ANIONGAP 11   < > 9 11 11    < > = values in this interval not displayed.     Hematology Recent Labs  Lab 07/15/19 0444 07/16/19 0456 07/17/19 0511  WBC 14.1* 12.7* 13.6*  RBC 3.72* 3.56* 3.88  HGB 10.6* 10.2* 11.1*  HCT 33.8* 32.6* 35.1*  MCV 90.9 91.6 90.5  MCH 28.5 28.7 28.6  MCHC 31.4 31.3 31.6  RDW 13.8 13.9 13.8  PLT 340 325 365    BNP Recent Labs  Lab 07/10/19 1503  BNP 234.4*     DDimer No results for input(s): DDIMER in the last 168 hours.   Radiology    Dg Chest Port 1 View  Result Date: 07/16/2019 CLINICAL DATA:  : Pneumonia ,cough EXAM: PORTABLE CHEST 1 VIEW COMPARISON:  Chest radiograph 07/15/2019 FINDINGS: Stable cardiomediastinal contours with mildly  enlarged heart size. Persistent bilateral diffuse patchy airspace opacities suspicious for infection. There is mildly increased fullness of the left hilum compared to more remote prior studies. No pneumothorax or large pleural effusion. No acute finding in the visualized skeleton. Surgical hardware noted in the right proximal humerus. IMPRESSION: 1. Persistent bilateral patchy airspace opacities suspicious for infection. 2. Slightly increased fullness of the left hilum compared to more remote prior studies, possibly representing underlying adenopathy. Recommend attention on follow-up. Electronically Signed   By: Audie Pinto M.D.   On: 07/16/2019 09:56    Cardiac Studies   TTE 10/21/16: - Left ventricle: LVEF is severely depressed at approximately 15 to   20% The cavity size was normal. Wall thickness was normal. - Mitral valve: Calcified annulus. Mildly thickened leaflets .  Patient Profile     85 y.o. female with hx of with chronic systolic heart failure (LVEFof 15 to 20%-nonischemic based on negative stress test), paroxysmal atrial fibrillation on anticoagulation with Eliquis (2016), hypertension, DM2, HLD, prior TIA, and hx of orthostatic hypotension secondary to ACE/ARB who is being seen today for the evaluation of atrial fibrillation with RVR  Assessment & Plan    Atrial fibrillation with RVR: Has a history of paroxysmal atrial fibrillation currently on Eliquis.  Went into atrial fibrillation likely due to community-acquired pneumonia.  We Melissa [name] continue amiodarone 200 mg twice daily for 2 weeks then 200 mg a day.  We Melissa [name] arrange for follow-up in cardiology clinic.    Acute respiratory failure with hypoxia secondary to CAP/aspiration: Plan per primary team.  Chronic systolic heart failure: Ejection fraction 15 to 20%.  Presumed nonischemic based on stress testing.  We Melissa [name] continue with home medications.   CKD stage III: Creatinine has improved.  No changes.  HLD: Continue statin,  Lovaza  DM2: Hemoglobin A1c, 6.1  For questions or updates, please contact [facility] Please consult [email or web address] for contact info under  [facility] Melissa [name] sign off.   Medication Recommendations:  Amiodarone as above Other recommendations (labs, testing, etc):   Follow up as an outpatient:  Arranged with [facility]     Signed, Melissa Eroh Meredith Leeds, MD  07/17/2019, 12:46 PM

## 2019-07-17 NOTE — Progress Notes (Signed)
PROGRESS NOTE  Melissa [name] BHA:[id number] DOB: [birth year] DOA: 07/10/2019 PCP: [name], MD  Brief History:  85 y.o. female with medical history significant for chronic systolic CHF (97%), DM2, HTN, Afib on Eliquis, and HLD who presented to the ED with c/o cough for the past 2 weeks. Pt has a h/o recurrent pneumonia and chronic cough and typically has recurrences about this time every year. She is followed by Dr. [name] in [place] [facility] and was last seen there in July 2019. High res CT in Sept 2019 showed bronchiectatic changes and scarring, with concern for possible MAI vs COP vs recurrent aspiration. She had been doing fairly well since then except for chronic dry cough. 2 weeks PTA cough began to worsen, and became productive of thick mucous. She has had no fevers. She was seen in clinic (in the parking lot) on Tuesday of week PTA and started on a Z pack. Had COVID test on Wed at urgent care which was negative. Despite azithromycin she worsened and was coughing so badly that she was having frequent post-tussive emesis.  Because of worsening cough and shortness of breath, the patient's daughter brought her to the emergency department for further evaluation. She was admitted for Acute respiratory failure d/t bronchopneumonia complicating underlying bronchiectasis.  She was continued on antibiotics however she did not improve.  Repeat chest x-ray on 07/13/2019 showed worsening of bilateral patchy opacities.  PCCM was consulted on 07/14/2019.  Early morning of 07/15/2019, patient went into atrial fibrillation with RVR.  She did not respond to diltiazem bolus and was started on amiodarone drip.  Cardiology was consulted on 07/15/2019.  Assessment/Plan: Acute respiratory failure with hypoxia/bronchopneumonia/bronchiectasis -Secondary to pneumonia complicating underlying bronchiectasis.  Covid negative.  Chest x-ray shows bilateral multiple patchy ill-defined opacities.  Repeat chest x-ray from  07/13/2019 shows worsening patchy bilateral infiltrates.  Seen by PCCM.  Zosyn and azithromycin were switched to oral Augmentin by PCCM on 07/14/2019.  Continue Xopenex, Atrovent and Pulmicort. Appreciate their help. MBS on 05/19/2018 showed mild aspiration risk.  Procalcitonin only 0.16. She is now on 3 to 4 L of oxygen.  Feels some shortness of breath.  Looks slightly worse than yesterday.  Continues to have clear sputum production.    Paroxysmal atrial fibrillation Went into rapid ventricular response 2 AM on 07/15/2019.  no results from diltiazem bolus, then drip which caused hypotension so she was started on amiodarone drip.  Cardiology was consulted on 07/15/2019.  She was continued on amiodarone drip which was switched to oral amiodarone yesterday.  She remains in a normal sinus rhythm.  Continue Eliquis.  Diabetes mellitus type 2 -05/05/2019 hemoglobin A1c 6.1.  Blood sugar very well controlled.  Continue 12 units of Levemir along with SSI.  CKD stage III -Baseline creatinine 1.2-1.5.  Currently at baseline.  Continue to watch.  Chronic systolic CHF -3/53/2992 echo EF 15-20%, trivial MR - Trace ankle edema otherwise appears euvolemic. -Continue home dose of furosemide -Continue metoprolol succinate - Sees [name], Cardiology as OP.  Hyperlipidemia -Continue statin and Lovaza.  Hypothyroidism -Continue Synthroid -July 2020 TSH 1.72  Chronic back pain - Lidocaine patch as per reported home regimen.  Hypomagnesemia - resolved.  Anemia, suspect chronic disease - Follow CBC's.  Stable.  Disposition Plan:  DC home pending clinical improvement, hopefully tomorrow Family Communication: None present at bedside.  Will call him later.  Consultants: PCCM on 07/14/2019  Code Status:  FULL  DVT  Prophylaxis:  apixaban  Procedures: None  Antibiotics: Zosyn 9/27>>> 07/14/2019 Oral Augmentin 07/14/2019>   Subjective: Patient seen and examined.  She complains of shortness of breath  and looks worse.  Now on 3 to 4 L of oxygen as well. Objective: Vitals:   07/17/19 0803 07/17/19 0812 07/17/19 0825 07/17/19 0924  BP:   (!) 125/58 121/60  Pulse:   (!) 102 (!) 109  Resp:   17   Temp:   98.2 F (36.8 C)   TempSrc:   Oral   SpO2: 95% 96% 91%   Weight:      Height:        Intake/Output Summary (Last 24 hours) at 07/17/2019 1052 Last data filed at 07/16/2019 1348 Gross per 24 hour  Intake 120 ml  Output -  Net 120 ml   Weight change:  Exam:  General exam: Appears sick Respiratory system: Coarse breath sounds with rhonchi bilaterally, respiratory effort normal. Cardiovascular system: S1 & S2 heard, RRR. No JVD, murmurs, rubs, gallops or clicks. No pedal edema. Gastrointestinal system: Abdomen is nondistended, soft and nontender. No organomegaly or masses felt. Normal bowel sounds heard. Central nervous system: Alert and oriented. No focal neurological deficits. Extremities: Symmetric 5 x 5 power. Skin: No rashes, lesions or ulcers.  Psychiatry: Judgement and insight appear poor mood & affect appropriate.   Data Reviewed: I have personally reviewed following labs and imaging studies Basic Metabolic Panel: Recent Labs  Lab 07/12/19 0439 07/13/19 0544 07/14/19 0543 07/15/19 0444 07/16/19 0456  NA 138 135 135 135 135  K 3.9 3.9 4.3 3.6 4.1  CL 103 101 99 93* 92*  CO2 27 25 27 31  32  GLUCOSE 113* 96 114* 110* 103*  BUN 18 17 10 10 10   CREATININE 1.38* 1.33* 1.23* 1.31* 1.18*  CALCIUM 7.5* 8.0* 8.1* 8.3* 8.1*  MG 1.2* 1.8  --  1.5*  --    Liver Function Tests: Recent Labs  Lab 07/10/19 1503  AST 19  ALT 14  ALKPHOS 89  BILITOT 0.5  PROT 7.0  ALBUMIN 2.7*   CBC: Recent Labs  Lab 07/10/19 1503  07/13/19 0544 07/14/19 0543 07/15/19 0444 07/16/19 0456 07/17/19 0511  WBC 14.1*   < > 11.7* 11.5* 14.1* 12.7* 13.6*  NEUTROABS 11.1*  --   --  9.4* 10.8* 9.2* 10.3*  HGB 12.3   < > 10.6* 10.7* 10.6* 10.2* 11.1*  HCT 36.7   < > 32.9* 33.0* 33.8*  32.6* 35.1*  MCV 87.6   < > 91.1 91.2 90.9 91.6 90.5  PLT 250   < > 261 281 340 325 365   < > = values in this interval not displayed.   Cardiac Enzymes: No results for input(s): CKTOTAL, CKMB, CKMBINDEX, TROPONINI in the last 168 hours. BNP: Invalid input(s): POCBNP CBG: Recent Labs  Lab 07/16/19 0741 07/16/19 1159 07/16/19 1546 07/16/19 2201 07/17/19 0824  GLUCAP 111* 115* 155* 137* 104*     Recent Results (from the past 240 hour(s))  SARS Coronavirus 2 [facility] order, Performed in [facility] hospital lab) Nasopharyngeal Nasopharyngeal Swab     Status: None   Collection Time: 07/10/19  6:46 PM   Specimen: Nasopharyngeal Swab  Result Value Ref Range Status   SARS Coronavirus 2 NEGATIVE NEGATIVE Final    Comment: (NOTE) If result is NEGATIVE SARS-CoV-2 target nucleic acids are NOT DETECTED. The SARS-CoV-2 RNA is generally detectable in upper and lower  respiratory specimens during the acute phase of infection. The lowest  concentration of SARS-CoV-2 viral copies this assay can detect is 250  copies / mL. A negative result does not preclude SARS-CoV-2 infection  and should not be used as the sole basis for treatment or other  patient management decisions.  A negative result may occur with  improper specimen collection / handling, submission of specimen other  than nasopharyngeal swab, presence of viral mutation(s) within the  areas targeted by this assay, and inadequate number of viral copies  (<250 copies / mL). A negative result must be combined with clinical  observations, patient history, and epidemiological information. If result is POSITIVE SARS-CoV-2 target nucleic acids are DETECTED. The SARS-CoV-2 RNA is generally detectable in upper and lower  respiratory specimens dur ing the acute phase of infection.  Positive  results are indicative of active infection with SARS-CoV-2.  Clinical  correlation with patient history and other diagnostic information is   necessary to determine patient infection status.  Positive results do  not rule out bacterial infection or co-infection with other viruses. If result is PRESUMPTIVE POSTIVE SARS-CoV-2 nucleic acids MAY BE PRESENT.   A presumptive positive result was obtained on the submitted specimen  and confirmed on repeat testing.  While 2019 novel coronavirus  (SARS-CoV-2) nucleic acids may be present in the submitted sample  additional confirmatory testing may be necessary for epidemiological  and / or clinical management purposes  to differentiate between  SARS-CoV-2 and other Sarbecovirus currently known to infect humans.  If clinically indicated additional testing with an alternate test  methodology [id number]) is advised. The SARS-CoV-2 RNA is generally  detectable in upper and lower respiratory sp ecimens during the acute  phase of infection. The expected result is Negative. Fact Sheet for Patients:  StrictlyIdeas.no Fact Sheet for Healthcare Providers: BankingDealers.co.za This test is not yet approved or cleared by the [place] FDA and has been authorized for detection and/or diagnosis of SARS-CoV-2 by FDA under an Emergency Use Authorization (EUA).  This EUA will remain in effect (meaning this test can be used) for the duration of the COVID-19 declaration under Section 564(b)(1) of the Act, 21 U.S.C. section 360bbb-3(b)(1), unless the authorization is terminated or revoked sooner. Performed at [facility], [place] [address] [place], [place] [address]   Culture, sputum-assessment     Status: None   Collection Time: 07/11/19  1:13 PM   Specimen: Sputum  Result Value Ref Range Status   Specimen Description SPU  Final   Special Requests NONE  Final   Sputum evaluation   Final    THIS SPECIMEN IS ACCEPTABLE FOR SPUTUM CULTURE Performed at [facility], [address] [place], [place] [address]    Report Status 07/11/2019  FINAL  Final  Culture, respiratory     Status: None   Collection Time: 07/11/19  1:13 PM   Specimen: Sputum  Result Value Ref Range Status   Specimen Description SPU  Final   Special Requests NONE Reflexed from [id number]  Final   Gram Stain   Final    ABUNDANT WBC PRESENT, PREDOMINANTLY PMN NO ORGANISMS SEEN    Culture   Final    Consistent with normal respiratory flora. Performed at [facility], [place] [address] [place], [place] [address]    Report Status 07/13/2019 FINAL  Final  Respiratory Panel by PCR     Status: None   Collection Time: 07/14/19  7:35 PM   Specimen: Nasopharyngeal Swab; Respiratory  Result Value Ref Range Status  Adenovirus NOT DETECTED NOT DETECTED Final   Coronavirus 229E NOT DETECTED NOT DETECTED Final    Comment: (NOTE) The Coronavirus on the Respiratory Panel, DOES NOT test for the novel  Coronavirus (2019 nCoV)    Coronavirus HKU1 NOT DETECTED NOT DETECTED Final   Coronavirus NL63 NOT DETECTED NOT DETECTED Final   Coronavirus OC43 NOT DETECTED NOT DETECTED Final   Metapneumovirus NOT DETECTED NOT DETECTED Final   Rhinovirus / Enterovirus NOT DETECTED NOT DETECTED Final   Influenza A NOT DETECTED NOT DETECTED Final   Influenza B NOT DETECTED NOT DETECTED Final   Parainfluenza Virus 1 NOT DETECTED NOT DETECTED Final   Parainfluenza Virus 2 NOT DETECTED NOT DETECTED Final   Parainfluenza Virus 3 NOT DETECTED NOT DETECTED Final   Parainfluenza Virus 4 NOT DETECTED NOT DETECTED Final   Respiratory Syncytial Virus NOT DETECTED NOT DETECTED Final   Bordetella pertussis NOT DETECTED NOT DETECTED Final   Chlamydophila pneumoniae NOT DETECTED NOT DETECTED Final   Mycoplasma pneumoniae NOT DETECTED NOT DETECTED Final    Comment: Performed at [facility], [place] [address] [place], [place] [address]     Scheduled Meds: . amiodarone  200 mg Oral BID   Followed by  . [START ON 07/30/2019] amiodarone  200 mg Oral Daily  . amoxicillin-clavulanate  1  tablet Oral BID  . apixaban  5 mg Oral BID  . atorvastatin  40 mg Oral Daily  . cholecalciferol  1,000 Units Oral Daily  . FLUoxetine  20 mg Oral Daily  . furosemide  20 mg Oral Daily  . influenza vaccine adjuvanted  0.5 mL Intramuscular Tomorrow-1000  . insulin aspart  0-5 Units Subcutaneous QHS  . insulin aspart  0-9 Units Subcutaneous TID WC  . insulin detemir  12 Units Subcutaneous QHS  . ipratropium  0.5 mg Nebulization TID  . levothyroxine  50 mcg Oral Daily  . lidocaine  1 patch Transdermal Q24H  . metoprolol succinate  12.5 mg Oral Daily  . multivitamin  1 tablet Oral BID  . multivitamins with iron  1 tablet Oral Daily  . omega-3 acid ethyl esters  1,000 mg Oral Daily  . pantoprazole  40 mg Oral Daily  . pramipexole  0.75 mg Oral QHS  . pregabalin  50 mg Oral TID  . traZODone  100 mg Oral QHS  . umeclidinium bromide  1 puff Inhalation Daily   Continuous Infusions:   Procedures/Studies: Dg Chest 2 View  Result Date: 07/13/2019 CLINICAL DATA:  Shortness of breath for 2 weeks. Recent diagnosis of pneumonia. EXAM: CHEST - 2 VIEW COMPARISON:  PA and lateral chest 07/10/2019. FINDINGS: Patchy bilateral airspace disease has worsened, particularly in the left lung base. There is a small left pleural effusion. Heart size is normal. No pneumothorax. Aortic atherosclerosis is noted. IMPRESSION: Worsened patchy bilateral airspace disease most consistent with pneumonia. Atypical/viral infection is possible. New small left pleural effusion. Atherosclerosis. Electronically Signed   By: Inge Rise M.D.   On: 07/13/2019 15:01   Dg Chest 2 View  Result Date: 07/10/2019 CLINICAL DATA:  Shortness of breath. EXAM: CHEST - 2 VIEW COMPARISON:  Radiograph of Feb 27, 2018. CT scan of March 27, 2018 and July 08, 2018. FINDINGS: The heart size and mediastinal contours are within normal limits. No pneumothorax or pleural effusion is noted. There are multiple ill-defined opacities throughout  both lungs, some of which were present on prior exam and some of which are new. The visualized skeletal structures are  unremarkable. IMPRESSION: Multiple ill-defined opacities are noted throughout both lungs which are consistent with recurrent bronchopneumonia as described on prior CT scan. Aortic Atherosclerosis (ICD10-I70.0). Electronically Signed   By: Marijo Conception M.D.   On: 07/10/2019 15:37   Dg Chest Port 1 View  Result Date: 07/16/2019 CLINICAL DATA:  : Pneumonia ,cough EXAM: PORTABLE CHEST 1 VIEW COMPARISON:  Chest radiograph 07/15/2019 FINDINGS: Stable cardiomediastinal contours with mildly enlarged heart size. Persistent bilateral diffuse patchy airspace opacities suspicious for infection. There is mildly increased fullness of the left hilum compared to more remote prior studies. No pneumothorax or large pleural effusion. No acute finding in the visualized skeleton. Surgical hardware noted in the right proximal humerus. IMPRESSION: 1. Persistent bilateral patchy airspace opacities suspicious for infection. 2. Slightly increased fullness of the left hilum compared to more remote prior studies, possibly representing underlying adenopathy. Recommend attention on follow-up. Electronically Signed   By: Audie Pinto M.D.   On: 07/16/2019 09:56   Dg Chest Port 1 View  Result Date: 07/15/2019 CLINICAL DATA:  Pneumonia. EXAM: PORTABLE CHEST 1 VIEW COMPARISON:  07/13/2019. FINDINGS: Mediastinum and hilar structures normal. Heart size stable. Patchy multifocal bilateral pulmonary infiltrates are again noted. These have progressed slightly from prior exam. Small bilateral pleural effusions. No pneumothorax. Right humerus plate and screw fixation. IMPRESSION: Interim slight progression of patchy multifocal bilateral pulmonary infiltrates. Small bilateral pleural effusions. Electronically Signed   By: Marcello Moores  Register   On: 07/15/2019 09:02    Darliss Cheney, MD [facility]  To contact the  attending provider between 7A-7P or the covering provider during after hours 7P-7A, please log into the web site [email or web address] and access using universal [place] password for that web site. If you do not have the password, please call the hospital operator.   LOS: 7 days

## 2019-07-18 LAB — BASIC METABOLIC PANEL
Anion gap: 12 (ref 5–15)
BUN: 12 mg/dL (ref 8–23)
CO2: 33 mmol/L — ABNORMAL HIGH (ref 22–32)
Calcium: 8.3 mg/dL — ABNORMAL LOW (ref 8.9–10.3)
Chloride: 87 mmol/L — ABNORMAL LOW (ref 98–111)
Creatinine, Ser: 1.11 mg/dL — ABNORMAL HIGH (ref 0.44–1.00)
GFR calc Af Amer: 51 mL/min — ABNORMAL LOW (ref >60)
GFR calc non Af Amer: 44 mL/min — ABNORMAL LOW (ref >60)
Glucose, Bld: 205 mg/dL — ABNORMAL HIGH (ref 70–99)
Potassium: 4.6 mmol/L (ref 3.5–5.1)
Sodium: 132 mmol/L — ABNORMAL LOW (ref 135–145)

## 2019-07-18 LAB — CBC WITH DIFFERENTIAL/PLATELET
Abs Immature Granulocytes: 0.35 10*3/uL — ABNORMAL HIGH (ref 0.00–0.07)
Basophils Absolute: 0.1 10*3/uL (ref 0.0–0.1)
Basophils Relative: 0 %
Eosinophils Absolute: 0.3 10*3/uL (ref 0.0–0.5)
Eosinophils Relative: 2 %
HCT: 36.7 % (ref 36.0–46.0)
Hemoglobin: 11.6 g/dL — ABNORMAL LOW (ref 12.0–15.0)
Immature Granulocytes: 2 %
Lymphocytes Relative: 9 %
Lymphs Abs: 1.5 10*3/uL (ref 0.7–4.0)
MCH: 28.5 pg (ref 26.0–34.0)
MCHC: 31.6 g/dL (ref 30.0–36.0)
MCV: 90.2 fL (ref 80.0–100.0)
Monocytes Absolute: 1.1 10*3/uL — ABNORMAL HIGH (ref 0.1–1.0)
Monocytes Relative: 7 %
Neutro Abs: 13.6 10*3/uL — ABNORMAL HIGH (ref 1.7–7.7)
Neutrophils Relative %: 80 %
Platelets: 399 10*3/uL (ref 150–400)
RBC: 4.07 MIL/uL (ref 3.87–5.11)
RDW: 13.6 % (ref 11.5–15.5)
WBC: 16.9 10*3/uL — ABNORMAL HIGH (ref 4.0–10.5)
nRBC: 0 % (ref 0.0–0.2)

## 2019-07-18 LAB — GLUCOSE, CAPILLARY
Glucose-Capillary: 94 mg/dL (ref 70–99)
Glucose-Capillary: 148 mg/dL — ABNORMAL HIGH (ref 70–99)
Glucose-Capillary: 137 mg/dL — ABNORMAL HIGH (ref 70–99)
Glucose-Capillary: 151 mg/dL — ABNORMAL HIGH (ref 70–99)

## 2019-07-18 MED ORDER — LEVALBUTEROL HCL 0.63 MG/3ML IN NEBU
0.6300 mg | INHALATION_SOLUTION | Freq: Three times a day (TID) | RESPIRATORY_TRACT | Status: DC
  Administered 2019-07-18 – 2019-07-26 (×20): 0.63 mg via RESPIRATORY_TRACT
  Filled 2019-07-18 (×25): qty 3

## 2019-07-18 NOTE — Progress Notes (Signed)
Pt having coughing spasm

## 2019-07-18 NOTE — Progress Notes (Signed)
PROGRESS NOTE  ERUM CERCONE JFH:[id number] DOB: 04-09-1928 DOA: 07/10/2019 PCP: [name], MD  Brief History:  85 y.o. female with medical history significant for chronic systolic CHF (93%), DM2, HTN, Afib on Eliquis, and HLD who presented to the ED with c/o cough for the past 2 weeks. Pt has a h/o recurrent pneumonia and chronic cough and typically has recurrences about this time every year. She is followed by Dr. [name] in [place] [facility] and was last seen there in July 2019. High res CT in Sept 2019 showed bronchiectatic changes and scarring, with concern for possible MAI vs COP vs recurrent aspiration. She had been doing fairly well since then except for chronic dry cough. 2 weeks PTA cough began to worsen, and became productive of thick mucous. She has had no fevers. She was seen in clinic (in the parking lot) on Tuesday of week PTA and started on a Z pack. Had COVID test on Wed at urgent care which was negative. Despite azithromycin she worsened and was coughing so badly that she was having frequent post-tussive emesis.  Because of worsening cough and shortness of breath, the patient's daughter brought her to the emergency department for further evaluation. She was admitted for Acute respiratory failure d/t bronchopneumonia complicating underlying bronchiectasis.  She was continued on antibiotics however she did not improve.  Repeat chest x-ray on 07/13/2019 showed worsening of bilateral patchy opacities.  PCCM was consulted on 07/14/2019.  Early morning of 07/15/2019, patient went into atrial fibrillation with RVR.  She did not respond to diltiazem bolus and was started on amiodarone drip.  Cardiology was consulted on 07/15/2019.  She was continued on amiodarone drip for approximately 24 hours and then it was switched to oral amiodarone.  Assessment/Plan: Acute respiratory failure with hypoxia/bronchopneumonia/bronchiectasis -Secondary to pneumonia complicating underlying bronchiectasis.   Covid negative.  Chest x-ray shows bilateral multiple patchy ill-defined opacities.  Repeat chest x-ray from 07/13/2019 shows worsening patchy bilateral infiltrates.  Seen by PCCM.  Zosyn and azithromycin were switched to oral Augmentin by PCCM on 07/14/2019.  Continue Xopenex, Atrovent and Pulmicort. Appreciate their help. MBS on 05/19/2018 showed mild aspiration risk.  Procalcitonin only 0.16. She is now on 3 to 4 L of oxygen.  Feels and looks much better than yesterday.  Will defer further management to PCCM.  Paroxysmal atrial fibrillation Went into rapid ventricular response 2 AM on 07/15/2019.  no results from diltiazem bolus, then drip which caused hypotension so she was started on amiodarone drip.  Cardiology was consulted on 07/15/2019.  She was continued on amiodarone drip which was switched to oral amiodarone on 07/16/2019.  She remains in a normal sinus rhythm.  Continue Eliquis.  Diabetes mellitus type 2 -05/05/2019 hemoglobin A1c 6.1.  Blood sugar very well controlled.  Continue 12 units of Levemir along with SSI.  CKD stage III -Baseline creatinine 1.2-1.5.  Currently at baseline.  Continue to watch.  Chronic systolic CHF -7/34/2876 echo EF 15-20%, trivial MR. euvolemic. -Continue home dose of furosemide -Continue metoprolol succinate - Sees [name], Cardiology as OP.  Hyperlipidemia -Continue statin and Lovaza.  Hypothyroidism -Continue Synthroid -July 2020 TSH 1.72  Chronic back pain - Lidocaine patch as per reported home regimen.  Hypomagnesemia - resolved.  Anemia, suspect chronic disease - Follow CBC's.  Stable.  Disposition Plan:  DC home pending clinical improvement, Family Communication: None present at bedside.  Plan of care discussed with patient.  Consultants: PCCM on  07/14/2019 and cardiology on 07/15/2019  Code Status:  FULL  DVT Prophylaxis:  apixaban  Procedures: None  Antibiotics: Zosyn 9/27>>> 07/14/2019 Oral Augmentin 07/14/2019>   Subjective:  Patient seen and examined.  She was sitting in the bed.  Looking much better and stated that she feels better than yesterday as well.  Objective: Vitals:   07/18/19 0600 07/18/19 0736 07/18/19 1143 07/18/19 1200  BP: 131/61 (!) 127/52 104/60 136/62  Pulse:  95 94   Resp: (!) 21 (!) 22 (!) 22 (!) 24  Temp:  98 F (36.7 C) 100.2 F (37.9 C)   TempSrc:  Oral Oral   SpO2: 100% 100% 97% 99%  Weight:      Height:        Intake/Output Summary (Last 24 hours) at 07/18/2019 1325 Last data filed at 07/18/2019 1147 Gross per 24 hour  Intake 120 ml  Output 1900 ml  Net -1780 ml   Weight change:  Exam: General exam: Appears calm and comfortable  Respiratory system: Rhonchi bilaterally. Respiratory effort normal. Cardiovascular system: S1 & S2 heard, RRR. No JVD, murmurs, rubs, gallops or clicks. No pedal edema. Gastrointestinal system: Abdomen is nondistended, soft and nontender. No organomegaly or masses felt. Normal bowel sounds heard. Central nervous system: Alert and oriented. No focal neurological deficits. Extremities: Symmetric 5 x 5 power. Skin: No rashes, lesions or ulcers.  Psychiatry: Judgement and insight appear poor, mood & affect flat.  Data Reviewed: I have personally reviewed following labs and imaging studies Basic Metabolic Panel: Recent Labs  Lab 07/12/19 0439 07/13/19 0544 07/14/19 0543 07/15/19 0444 07/16/19 0456 07/18/19 0937  NA 138 135 135 135 135 132*  K 3.9 3.9 4.3 3.6 4.1 4.6  CL 103 101 99 93* 92* 87*  CO2 27 25 27 31  32 33*  GLUCOSE 113* 96 114* 110* 103* 205*  BUN 18 17 10 10 10 12   CREATININE 1.38* 1.33* 1.23* 1.31* 1.18* 1.11*  CALCIUM 7.5* 8.0* 8.1* 8.3* 8.1* 8.3*  MG 1.2* 1.8  --  1.5*  --   --    Liver Function Tests: No results for input(s): AST, ALT, ALKPHOS, BILITOT, PROT, ALBUMIN in the last 168 hours. CBC: Recent Labs  Lab 07/14/19 0543 07/15/19 0444 07/16/19 0456 07/17/19 0511 07/18/19 0752  WBC 11.5* 14.1* 12.7* 13.6* 16.9*   NEUTROABS 9.4* 10.8* 9.2* 10.3* 13.6*  HGB 10.7* 10.6* 10.2* 11.1* 11.6*  HCT 33.0* 33.8* 32.6* 35.1* 36.7  MCV 91.2 90.9 91.6 90.5 90.2  PLT 281 340 325 365 399   Cardiac Enzymes: No results for input(s): CKTOTAL, CKMB, CKMBINDEX, TROPONINI in the last 168 hours. BNP: Invalid input(s): POCBNP CBG: Recent Labs  Lab 07/17/19 1145 07/17/19 1801 07/17/19 1949 07/18/19 0741 07/18/19 1142  GLUCAP 125* 145* 183* 94 148*     Recent Results (from the past 240 hour(s))  SARS Coronavirus 2 [facility] order, Performed in [facility] hospital lab) Nasopharyngeal Nasopharyngeal Swab     Status: None   Collection Time: 07/10/19  6:46 PM   Specimen: Nasopharyngeal Swab  Result Value Ref Range Status   SARS Coronavirus 2 NEGATIVE NEGATIVE Final    Comment: (NOTE) If result is NEGATIVE SARS-CoV-2 target nucleic acids are NOT DETECTED. The SARS-CoV-2 RNA is generally detectable in upper and lower  respiratory specimens during the acute phase of infection. The lowest  concentration of SARS-CoV-2 viral copies this assay can detect is 250  copies / mL. A negative result does not preclude SARS-CoV-2 infection  and should  not be used as the sole basis for treatment or other  patient management decisions.  A negative result may occur with  improper specimen collection / handling, submission of specimen other  than nasopharyngeal swab, presence of viral mutation(s) within the  areas targeted by this assay, and inadequate number of viral copies  (<250 copies / mL). A negative result must be combined with clinical  observations, patient history, and epidemiological information. If result is POSITIVE SARS-CoV-2 target nucleic acids are DETECTED. The SARS-CoV-2 RNA is generally detectable in upper and lower  respiratory specimens dur ing the acute phase of infection.  Positive  results are indicative of active infection with SARS-CoV-2.  Clinical  correlation with patient history and other  diagnostic information is  necessary to determine patient infection status.  Positive results do  not rule out bacterial infection or co-infection with other viruses. If result is PRESUMPTIVE POSTIVE SARS-CoV-2 nucleic acids MAY BE PRESENT.   A presumptive positive result was obtained on the submitted specimen  and confirmed on repeat testing.  While 2019 novel coronavirus  (SARS-CoV-2) nucleic acids may be present in the submitted sample  additional confirmatory testing may be necessary for epidemiological  and / or clinical management purposes  to differentiate between  SARS-CoV-2 and other Sarbecovirus currently known to infect humans.  If clinically indicated additional testing with an alternate test  methodology [phone]) is advised. The SARS-CoV-2 RNA is generally  detectable in upper and lower respiratory sp ecimens during the acute  phase of infection. The expected result is Negative. Fact Sheet for Patients:  StrictlyIdeas.no Fact Sheet for Healthcare Providers: BankingDealers.co.za This test is not yet approved or cleared by the [place] FDA and has been authorized for detection and/or diagnosis of SARS-CoV-2 by FDA under an Emergency Use Authorization (EUA).  This EUA will remain in effect (meaning this test can be used) for the duration of the COVID-19 declaration under Section 564(b)(1) of the Act, 21 U.S.C. section 360bbb-3(b)(1), unless the authorization is terminated or revoked sooner. Performed at [facility], [place] [address] [place], [place] [address]   Culture, sputum-assessment     Status: None   Collection Time: 07/11/19  1:13 PM   Specimen: Sputum  Result Value Ref Range Status   Specimen Description SPU  Final   Special Requests NONE  Final   Sputum evaluation   Final    THIS SPECIMEN IS ACCEPTABLE FOR SPUTUM CULTURE Performed at [facility], [address], [place], [place] [address]     Report Status 07/11/2019 FINAL  Final  Culture, respiratory     Status: None   Collection Time: 07/11/19  1:13 PM   Specimen: Sputum  Result Value Ref Range Status   Specimen Description SPU  Final   Special Requests NONE Reflexed from [id number]  Final   Gram Stain   Final    ABUNDANT WBC PRESENT, PREDOMINANTLY PMN NO ORGANISMS SEEN    Culture   Final    Consistent with normal respiratory flora. Performed at [facility], [place] [address] [place], [place] [address]    Report Status 07/13/2019 FINAL  Final  Respiratory Panel by PCR     Status: None   Collection Time: 07/14/19  7:35 PM   Specimen: Nasopharyngeal Swab; Respiratory  Result Value Ref Range Status   Adenovirus NOT DETECTED NOT DETECTED Final   Coronavirus 229E NOT DETECTED NOT DETECTED Final    Comment: (NOTE) The Coronavirus on the Respiratory Panel,  DOES NOT test for the novel  Coronavirus (2019 nCoV)    Coronavirus HKU1 NOT DETECTED NOT DETECTED Final   Coronavirus NL63 NOT DETECTED NOT DETECTED Final   Coronavirus OC43 NOT DETECTED NOT DETECTED Final   Metapneumovirus NOT DETECTED NOT DETECTED Final   Rhinovirus / Enterovirus NOT DETECTED NOT DETECTED Final   Influenza A NOT DETECTED NOT DETECTED Final   Influenza B NOT DETECTED NOT DETECTED Final   Parainfluenza Virus 1 NOT DETECTED NOT DETECTED Final   Parainfluenza Virus 2 NOT DETECTED NOT DETECTED Final   Parainfluenza Virus 3 NOT DETECTED NOT DETECTED Final   Parainfluenza Virus 4 NOT DETECTED NOT DETECTED Final   Respiratory Syncytial Virus NOT DETECTED NOT DETECTED Final   Bordetella pertussis NOT DETECTED NOT DETECTED Final   Chlamydophila pneumoniae NOT DETECTED NOT DETECTED Final   Mycoplasma pneumoniae NOT DETECTED NOT DETECTED Final    Comment: Performed at [facility], [place] [address] [place], [place] [address]     Scheduled Meds: . amiodarone  200 mg Oral BID   Followed by  . [START ON 07/30/2019] amiodarone  200 mg Oral Daily  .  amoxicillin-clavulanate  1 tablet Oral BID  . apixaban  5 mg Oral BID  . atorvastatin  40 mg Oral Daily  . cholecalciferol  1,000 Units Oral Daily  . FLUoxetine  20 mg Oral Daily  . furosemide  20 mg Oral Daily  . influenza vaccine adjuvanted  0.5 mL Intramuscular Tomorrow-1000  . insulin aspart  0-5 Units Subcutaneous QHS  . insulin aspart  0-9 Units Subcutaneous TID WC  . insulin detemir  12 Units Subcutaneous QHS  . ipratropium  0.5 mg Nebulization TID  . levalbuterol  0.63 mg Nebulization TID  . levothyroxine  50 mcg Oral Daily  . lidocaine  1 patch Transdermal Q24H  . metoprolol succinate  12.5 mg Oral Daily  . multivitamin  1 tablet Oral BID  . multivitamins with iron  1 tablet Oral Daily  . omega-3 acid ethyl esters  1,000 mg Oral Daily  . pantoprazole  40 mg Oral Daily  . pramipexole  0.75 mg Oral QHS  . pregabalin  50 mg Oral TID  . traZODone  100 mg Oral QHS  . umeclidinium bromide  1 puff Inhalation Daily   Continuous Infusions:   Procedures/Studies: Dg Chest 2 View  Result Date: 07/13/2019 CLINICAL DATA:  Shortness of breath for 2 weeks. Recent diagnosis of pneumonia. EXAM: CHEST - 2 VIEW COMPARISON:  PA and lateral chest 07/10/2019. FINDINGS: Patchy bilateral airspace disease has worsened, particularly in the left lung base. There is a small left pleural effusion. Heart size is normal. No pneumothorax. Aortic atherosclerosis is noted. IMPRESSION: Worsened patchy bilateral airspace disease most consistent with pneumonia. Atypical/viral infection is possible. New small left pleural effusion. Atherosclerosis. Electronically Signed   By: Inge Rise M.D.   On: 07/13/2019 15:01   Dg Chest 2 View  Result Date: 07/10/2019 CLINICAL DATA:  Shortness of breath. EXAM: CHEST - 2 VIEW COMPARISON:  Radiograph of Feb 27, 2018. CT scan of March 27, 2018 and July 08, 2018. FINDINGS: The heart size and mediastinal contours are within normal limits. No pneumothorax or pleural  effusion is noted. There are multiple ill-defined opacities throughout both lungs, some of which were present on prior exam and some of which are new. The visualized skeletal structures are unremarkable. IMPRESSION: Multiple ill-defined opacities are noted throughout both lungs which are consistent with recurrent bronchopneumonia as described  on prior CT scan. Aortic Atherosclerosis (ICD10-I70.0). Electronically Signed   By: Marijo Conception M.D.   On: 07/10/2019 15:37   Dg Chest Port 1 View  Result Date: 07/16/2019 CLINICAL DATA:  : Pneumonia ,cough EXAM: PORTABLE CHEST 1 VIEW COMPARISON:  Chest radiograph 07/15/2019 FINDINGS: Stable cardiomediastinal contours with mildly enlarged heart size. Persistent bilateral diffuse patchy airspace opacities suspicious for infection. There is mildly increased fullness of the left hilum compared to more remote prior studies. No pneumothorax or large pleural effusion. No acute finding in the visualized skeleton. Surgical hardware noted in the right proximal humerus. IMPRESSION: 1. Persistent bilateral patchy airspace opacities suspicious for infection. 2. Slightly increased fullness of the left hilum compared to more remote prior studies, possibly representing underlying adenopathy. Recommend attention on follow-up. Electronically Signed   By: Audie Pinto M.D.   On: 07/16/2019 09:56   Dg Chest Port 1 View  Result Date: 07/15/2019 CLINICAL DATA:  Pneumonia. EXAM: PORTABLE CHEST 1 VIEW COMPARISON:  07/13/2019. FINDINGS: Mediastinum and hilar structures normal. Heart size stable. Patchy multifocal bilateral pulmonary infiltrates are again noted. These have progressed slightly from prior exam. Small bilateral pleural effusions. No pneumothorax. Right humerus plate and screw fixation. IMPRESSION: Interim slight progression of patchy multifocal bilateral pulmonary infiltrates. Small bilateral pleural effusions. Electronically Signed   By: Marcello Moores  Register   On: 07/15/2019  09:02    Darliss Cheney, MD [facility]  To contact the attending provider between 7A-7P or the covering provider during after hours 7P-7A, please log into the web site [email or web address] and access using universal [place] password for that web site. If you do not have the password, please call the hospital operator.   LOS: 8 days

## 2019-07-19 ENCOUNTER — Inpatient Hospital Stay (HOSPITAL_COMMUNITY): Payer: Medicare Other

## 2019-07-19 ENCOUNTER — Telehealth: Payer: Self-pay | Admitting: Internal Medicine

## 2019-07-19 LAB — CBC WITH DIFFERENTIAL/PLATELET
Abs Immature Granulocytes: 0.3 10*3/uL — ABNORMAL HIGH (ref 0.00–0.07)
Basophils Absolute: 0.1 10*3/uL (ref 0.0–0.1)
Basophils Relative: 0 %
Eosinophils Absolute: 0.3 10*3/uL (ref 0.0–0.5)
Eosinophils Relative: 2 %
HCT: 34.3 % — ABNORMAL LOW (ref 36.0–46.0)
Hemoglobin: 10.7 g/dL — ABNORMAL LOW (ref 12.0–15.0)
Immature Granulocytes: 2 %
Lymphocytes Relative: 9 %
Lymphs Abs: 1.3 10*3/uL (ref 0.7–4.0)
MCH: 28.5 pg (ref 26.0–34.0)
MCHC: 31.2 g/dL (ref 30.0–36.0)
MCV: 91.2 fL (ref 80.0–100.0)
Monocytes Absolute: 0.9 10*3/uL (ref 0.1–1.0)
Monocytes Relative: 7 %
Neutro Abs: 10.7 10*3/uL — ABNORMAL HIGH (ref 1.7–7.7)
Neutrophils Relative %: 80 %
Platelets: 398 10*3/uL (ref 150–400)
RBC: 3.76 MIL/uL — ABNORMAL LOW (ref 3.87–5.11)
RDW: 13.3 % (ref 11.5–15.5)
WBC: 13.5 10*3/uL — ABNORMAL HIGH (ref 4.0–10.5)
nRBC: 0 % (ref 0.0–0.2)

## 2019-07-19 LAB — BLOOD GAS, ARTERIAL
Acid-Base Excess: 14.2 mmol/L — ABNORMAL HIGH (ref 0.0–2.0)
Bicarbonate: 39 mmol/L — ABNORMAL HIGH (ref 20.0–28.0)
Drawn by: 519031
O2 Content: 4 L/min
O2 Saturation: 91.9 %
Patient temperature: 98
pCO2 arterial: 54.8 mmHg — ABNORMAL HIGH (ref 32.0–48.0)
pH, Arterial: 7.464 — ABNORMAL HIGH (ref 7.350–7.450)
pO2, Arterial: 60.6 mmHg — ABNORMAL LOW (ref 83.0–108.0)

## 2019-07-19 LAB — BASIC METABOLIC PANEL
Anion gap: 11 (ref 5–15)
BUN: 11 mg/dL (ref 8–23)
CO2: 36 mmol/L — ABNORMAL HIGH (ref 22–32)
Calcium: 8.3 mg/dL — ABNORMAL LOW (ref 8.9–10.3)
Chloride: 88 mmol/L — ABNORMAL LOW (ref 98–111)
Creatinine, Ser: 1.11 mg/dL — ABNORMAL HIGH (ref 0.44–1.00)
GFR calc Af Amer: 51 mL/min — ABNORMAL LOW (ref >60)
GFR calc non Af Amer: 44 mL/min — ABNORMAL LOW (ref >60)
Glucose, Bld: 116 mg/dL — ABNORMAL HIGH (ref 70–99)
Potassium: 4.1 mmol/L (ref 3.5–5.1)
Sodium: 135 mmol/L (ref 135–145)

## 2019-07-19 LAB — GLUCOSE, CAPILLARY
Glucose-Capillary: 112 mg/dL — ABNORMAL HIGH (ref 70–99)
Glucose-Capillary: 158 mg/dL — ABNORMAL HIGH (ref 70–99)
Glucose-Capillary: 161 mg/dL — ABNORMAL HIGH (ref 70–99)
Glucose-Capillary: 210 mg/dL — ABNORMAL HIGH (ref 70–99)

## 2019-07-19 LAB — BRAIN NATRIURETIC PEPTIDE: B Natriuretic Peptide: 96.9 pg/mL (ref 0.0–100.0)

## 2019-07-19 MED ORDER — FUROSEMIDE 10 MG/ML IJ SOLN
40.0000 mg | Freq: Two times a day (BID) | INTRAMUSCULAR | Status: DC
  Administered 2019-07-19 – 2019-07-22 (×6): 40 mg via INTRAVENOUS
  Filled 2019-07-19 (×6): qty 4

## 2019-07-19 NOTE — Progress Notes (Signed)
PROGRESS NOTE  Melissa [name] BWG:[id number] DOB: June 27, 1928 DOA: 07/10/2019 PCP: Melissa Jordan, MD  Brief History:  85 y.o. female with medical history significant for chronic systolic CHF (17%), DM2, HTN, Afib on Eliquis, and HLD who presented to the ED with c/o cough for the past 2 weeks. Pt has a h/o recurrent pneumonia and chronic cough and typically has recurrences about this time every year. She is followed by Dr. [name] in [place] [facility] and was last seen there in July 2019. High res CT in Sept 2019 showed bronchiectatic changes and scarring, with concern for possible MAI vs COP vs recurrent aspiration. She had been doing fairly well since then except for chronic dry cough. 2 weeks PTA cough began to worsen, and became productive of thick mucous. She has had no fevers. She was seen in clinic (in the parking lot) on Tuesday of week PTA and started on a Z pack. Had COVID test on Wed at urgent care which was negative. Despite azithromycin she worsened and was coughing so badly that she was having frequent post-tussive emesis.  Because of worsening cough and shortness of breath, the patient's daughter brought her to the emergency department for further evaluation. She was admitted for Acute respiratory failure d/t bronchopneumonia complicating underlying bronchiectasis.  She was continued on antibiotics however she did not improve.  Repeat chest x-ray on 07/13/2019 showed worsening of bilateral patchy opacities.  PCCM was consulted on 07/14/2019.  Early morning of 07/15/2019, patient went into atrial fibrillation with RVR.  She did not respond to diltiazem bolus and was started on amiodarone drip.  Cardiology was consulted on 07/15/2019.  She was continued on amiodarone drip for approximately 24 hours and then it was switched to oral amiodarone.  Assessment/Plan: Acute respiratory failure with hypoxia/bronchopneumonia/bronchiectasis -Secondary to pneumonia complicating underlying bronchiectasis.   Covid negative.  Chest x-ray shows bilateral multiple patchy ill-defined opacities.  Repeat chest x-ray from 07/13/2019 shows worsening patchy bilateral infiltrates.  Repeat chest x-ray from 07/19/2019 shows stable lung opacities.  Seen by PCCM.  Zosyn and azithromycin were switched to oral Augmentin by PCCM on 07/14/2019.  Continue Xopenex, Atrovent and Pulmicort. Appreciate their help. MBS on 05/19/2018 showed mild aspiration risk.  Procalcitonin only 0.16.  She continues to remain on high amount oxygen, on 5 L today.  Awaiting PCCM to see her and delineate further plans.    Paroxysmal atrial fibrillation Went into rapid ventricular response 2 AM on 07/15/2019.  no results from diltiazem bolus, then drip which caused hypotension so she was started on amiodarone drip.  Cardiology was consulted on 07/15/2019.  She was continued on amiodarone drip which was switched to oral amiodarone on 07/16/2019.  She remains in a normal sinus rhythm.  Continue Eliquis.  Diabetes mellitus type 2 -05/05/2019 hemoglobin A1c 6.1.  Blood sugar very well controlled.  Continue 12 units of Levemir along with SSI.  CKD stage III -Baseline creatinine 1.2-1.5.  Currently at baseline.  Continue to watch.  Chronic systolic CHF -7/93/9030 echo EF 15-20%, trivial MR. euvolemic. -Continue home dose of furosemide -Continue metoprolol succinate - Sees [name], Cardiology as OP.  Hyperlipidemia -Continue statin and Lovaza.  Hypothyroidism -Continue Synthroid -July 2020 TSH 1.72  Chronic back pain - Lidocaine patch as per reported home regimen.  Hypomagnesemia - resolved.  Anemia, suspect chronic disease - Follow CBC's.  Stable.  Disposition Plan:  DC home pending clinical improvement, Family Communication: None present at bedside.  Plan of care discussed with patient.  Consultants: PCCM on 07/14/2019 and cardiology on 07/15/2019  Code Status:  FULL  DVT Prophylaxis:  apixaban  Procedures: None  Antibiotics:  Zosyn 9/27>>> 07/14/2019 Oral Augmentin 07/14/2019>   Subjective: Patient seen and examined.  She is alert and oriented x4.  She states that she continues to have shortness of breath.  She was asking when she will be able to go home.  Objective: Vitals:   07/19/19 0727 07/19/19 0821 07/19/19 0823 07/19/19 0900  BP: 122/77   (!) 127/48  Pulse: 95     Resp: (!) 21   19  Temp: (!) 97.3 F (36.3 C)     TempSrc: Oral     SpO2: 100% 100% 100% 100%  Weight:      Height:        Intake/Output Summary (Last 24 hours) at 07/19/2019 1116 Last data filed at 07/18/2019 2000 Gross per 24 hour  Intake 240 ml  Output 500 ml  Net -260 ml   Weight change:  Exam:  General exam: Appears calm and comfortable  Respiratory system: Coarse breath sounds with rhonchi bilaterally. Respiratory effort normal. Cardiovascular system: S1 & S2 heard, RRR. No JVD, murmurs, rubs, gallops or clicks. No pedal edema. Gastrointestinal system: Abdomen is nondistended, soft and nontender. No organomegaly or masses felt. Normal bowel sounds heard. Central nervous system: Alert and oriented. No focal neurological deficits. Extremities: Symmetric 5 x 5 power. Skin: No rashes, lesions or ulcers.  Psychiatry: Judgement and insight appear poor, mood & affect appropriate.   Data Reviewed: I have personally reviewed following labs and imaging studies Basic Metabolic Panel: Recent Labs  Lab 07/13/19 0544 07/14/19 0543 07/15/19 0444 07/16/19 0456 07/18/19 0937 07/19/19 0820  NA 135 135 135 135 132* 135  K 3.9 4.3 3.6 4.1 4.6 4.1  CL 101 99 93* 92* 87* 88*  CO2 25 27 31  32 33* 36*  GLUCOSE 96 114* 110* 103* 205* 116*  BUN 17 10 10 10 12 11   CREATININE 1.33* 1.23* 1.31* 1.18* 1.11* 1.11*  CALCIUM 8.0* 8.1* 8.3* 8.1* 8.3* 8.3*  MG 1.8  --  1.5*  --   --   --    Liver Function Tests: No results for input(s): AST, ALT, ALKPHOS, BILITOT, PROT, ALBUMIN in the last 168 hours. CBC: Recent Labs  Lab 07/15/19 0444  07/16/19 0456 07/17/19 0511 07/18/19 0752 07/19/19 0820  WBC 14.1* 12.7* 13.6* 16.9* 13.5*  NEUTROABS 10.8* 9.2* 10.3* 13.6* 10.7*  HGB 10.6* 10.2* 11.1* 11.6* 10.7*  HCT 33.8* 32.6* 35.1* 36.7 34.3*  MCV 90.9 91.6 90.5 90.2 91.2  PLT 340 325 365 399 398   Cardiac Enzymes: No results for input(s): CKTOTAL, CKMB, CKMBINDEX, TROPONINI in the last 168 hours. BNP: Invalid input(s): POCBNP CBG: Recent Labs  Lab 07/18/19 0741 07/18/19 1142 07/18/19 1618 07/18/19 2110 07/19/19 0725  GLUCAP 94 148* 137* 151* 112*     Recent Results (from the past 240 hour(s))  SARS Coronavirus 2 [facility] order, Performed in [facility] hospital lab) Nasopharyngeal Nasopharyngeal Swab     Status: None   Collection Time: 07/10/19  6:46 PM   Specimen: Nasopharyngeal Swab  Result Value Ref Range Status   SARS Coronavirus 2 NEGATIVE NEGATIVE Final    Comment: (NOTE) If result is NEGATIVE SARS-CoV-2 target nucleic acids are NOT DETECTED. The SARS-CoV-2 RNA is generally detectable in upper and lower  respiratory specimens during the acute phase of infection. The lowest  concentration of SARS-CoV-2 viral  copies this assay can detect is 250  copies / mL. A negative result does not preclude SARS-CoV-2 infection  and should not be used as the sole basis for treatment or other  patient management decisions.  A negative result may occur with  improper specimen collection / handling, submission of specimen other  than nasopharyngeal swab, presence of viral mutation(s) within the  areas targeted by this assay, and inadequate number of viral copies  (<250 copies / mL). A negative result must be combined with clinical  observations, patient history, and epidemiological information. If result is POSITIVE SARS-CoV-2 target nucleic acids are DETECTED. The SARS-CoV-2 RNA is generally detectable in upper and lower  respiratory specimens dur ing the acute phase of infection.  Positive  results are indicative  of active infection with SARS-CoV-2.  Clinical  correlation with patient history and other diagnostic information is  necessary to determine patient infection status.  Positive results do  not rule out bacterial infection or co-infection with other viruses. If result is PRESUMPTIVE POSTIVE SARS-CoV-2 nucleic acids MAY BE PRESENT.   A presumptive positive result was obtained on the submitted specimen  and confirmed on repeat testing.  While 2019 novel coronavirus  (SARS-CoV-2) nucleic acids may be present in the submitted sample  additional confirmatory testing may be necessary for epidemiological  and / or clinical management purposes  to differentiate between  SARS-CoV-2 and other Sarbecovirus currently known to infect humans.  If clinically indicated additional testing with an alternate test  methodology [phone]) is advised. The SARS-CoV-2 RNA is generally  detectable in upper and lower respiratory sp ecimens during the acute  phase of infection. The expected result is Negative. Fact Sheet for Patients:  StrictlyIdeas.no Fact Sheet for Healthcare Providers: BankingDealers.co.za This test is not yet approved or cleared by the [place] FDA and has been authorized for detection and/or diagnosis of SARS-CoV-2 by FDA under an Emergency Use Authorization (EUA).  This EUA will remain in effect (meaning this test can be used) for the duration of the COVID-19 declaration under Section 564(b)(1) of the Act, 21 U.S.C. section 360bbb-3(b)(1), unless the authorization is terminated or revoked sooner. Performed at [facility], [place] [address] [place], [place] [address]   Culture, sputum-assessment     Status: None   Collection Time: 07/11/19  1:13 PM   Specimen: Sputum  Result Value Ref Range Status   Specimen Description SPU  Final   Special Requests NONE  Final   Sputum evaluation   Final    THIS SPECIMEN IS ACCEPTABLE FOR SPUTUM  CULTURE Performed at [facility], [address], [place], [place] [address]    Report Status 07/11/2019 FINAL  Final  Culture, respiratory     Status: None   Collection Time: 07/11/19  1:13 PM   Specimen: Sputum  Result Value Ref Range Status   Specimen Description SPU  Final   Special Requests NONE Reflexed from [id number]  Final   Gram Stain   Final    ABUNDANT WBC PRESENT, PREDOMINANTLY PMN NO ORGANISMS SEEN    Culture   Final    Consistent with normal respiratory flora. Performed at [facility], [place] [address] [place], [place] [address]    Report Status 07/13/2019 FINAL  Final  Respiratory Panel by PCR     Status: None   Collection Time: 07/14/19  7:35 PM   Specimen: Nasopharyngeal Swab; Respiratory  Result Value Ref Range Status   Adenovirus NOT DETECTED NOT  DETECTED Final   Coronavirus 229E NOT DETECTED NOT DETECTED Final    Comment: (NOTE) The Coronavirus on the Respiratory Panel, DOES NOT test for the novel  Coronavirus (2019 nCoV)    Coronavirus HKU1 NOT DETECTED NOT DETECTED Final   Coronavirus NL63 NOT DETECTED NOT DETECTED Final   Coronavirus OC43 NOT DETECTED NOT DETECTED Final   Metapneumovirus NOT DETECTED NOT DETECTED Final   Rhinovirus / Enterovirus NOT DETECTED NOT DETECTED Final   Influenza A NOT DETECTED NOT DETECTED Final   Influenza B NOT DETECTED NOT DETECTED Final   Parainfluenza Virus 1 NOT DETECTED NOT DETECTED Final   Parainfluenza Virus 2 NOT DETECTED NOT DETECTED Final   Parainfluenza Virus 3 NOT DETECTED NOT DETECTED Final   Parainfluenza Virus 4 NOT DETECTED NOT DETECTED Final   Respiratory Syncytial Virus NOT DETECTED NOT DETECTED Final   Bordetella pertussis NOT DETECTED NOT DETECTED Final   Chlamydophila pneumoniae NOT DETECTED NOT DETECTED Final   Mycoplasma pneumoniae NOT DETECTED NOT DETECTED Final    Comment: Performed at [facility], [place] [address], [place], [place] [address]     Scheduled Meds: . amiodarone   200 mg Oral BID   Followed by  . [START ON 07/30/2019] amiodarone  200 mg Oral Daily  . amoxicillin-clavulanate  1 tablet Oral BID  . apixaban  5 mg Oral BID  . atorvastatin  40 mg Oral Daily  . cholecalciferol  1,000 Units Oral Daily  . FLUoxetine  20 mg Oral Daily  . furosemide  20 mg Oral Daily  . influenza vaccine adjuvanted  0.5 mL Intramuscular Tomorrow-1000  . insulin aspart  0-5 Units Subcutaneous QHS  . insulin aspart  0-9 Units Subcutaneous TID WC  . insulin detemir  12 Units Subcutaneous QHS  . ipratropium  0.5 mg Nebulization TID  . levalbuterol  0.63 mg Nebulization TID  . levothyroxine  50 mcg Oral Daily  . lidocaine  1 patch Transdermal Q24H  . metoprolol succinate  12.5 mg Oral Daily  . multivitamin  1 tablet Oral BID  . multivitamins with iron  1 tablet Oral Daily  . omega-3 acid ethyl esters  1,000 mg Oral Daily  . pantoprazole  40 mg Oral Daily  . pramipexole  0.75 mg Oral QHS  . pregabalin  50 mg Oral TID  . traZODone  100 mg Oral QHS  . umeclidinium bromide  1 puff Inhalation Daily   Continuous Infusions:   Procedures/Studies: Dg Chest 2 View  Result Date: 07/13/2019 CLINICAL DATA:  Shortness of breath for 2 weeks. Recent diagnosis of pneumonia. EXAM: CHEST - 2 VIEW COMPARISON:  PA and lateral chest 07/10/2019. FINDINGS: Patchy bilateral airspace disease has worsened, particularly in the left lung base. There is a small left pleural effusion. Heart size is normal. No pneumothorax. Aortic atherosclerosis is noted. IMPRESSION: Worsened patchy bilateral airspace disease most consistent with pneumonia. Atypical/viral infection is possible. New small left pleural effusion. Atherosclerosis. Electronically Signed   By: Inge Rise M.D.   On: 07/13/2019 15:01   Dg Chest 2 View  Result Date: 07/10/2019 CLINICAL DATA:  Shortness of breath. EXAM: CHEST - 2 VIEW COMPARISON:  Radiograph of Feb 27, 2018. CT scan of March 27, 2018 and July 08, 2018. FINDINGS: The  heart size and mediastinal contours are within normal limits. No pneumothorax or pleural effusion is noted. There are multiple ill-defined opacities throughout both lungs, some of which were present on prior exam and some of which are new. The  visualized skeletal structures are unremarkable. IMPRESSION: Multiple ill-defined opacities are noted throughout both lungs which are consistent with recurrent bronchopneumonia as described on prior CT scan. Aortic Atherosclerosis (ICD10-I70.0). Electronically Signed   By: Marijo Conception M.D.   On: 07/10/2019 15:37   Dg Chest Port 1 View  Result Date: 07/19/2019 CLINICAL DATA:  Shortness of breath, pneumonia. EXAM: PORTABLE CHEST 1 VIEW COMPARISON:  Radiograph of July 16, 2019. FINDINGS: Stable cardiomediastinal silhouette. Stable bilateral lung opacities are noted consistent with multifocal pneumonia. Small pleural effusions may be present. Bony thorax is unremarkable. No pneumothorax is noted. IMPRESSION: Stable bilateral lung opacities are noted consistent with multifocal pneumonia. Electronically Signed   By: Marijo Conception M.D.   On: 07/19/2019 08:41   Dg Chest Port 1 View  Result Date: 07/16/2019 CLINICAL DATA:  : Pneumonia ,cough EXAM: PORTABLE CHEST 1 VIEW COMPARISON:  Chest radiograph 07/15/2019 FINDINGS: Stable cardiomediastinal contours with mildly enlarged heart size. Persistent bilateral diffuse patchy airspace opacities suspicious for infection. There is mildly increased fullness of the left hilum compared to more remote prior studies. No pneumothorax or large pleural effusion. No acute finding in the visualized skeleton. Surgical hardware noted in the right proximal humerus. IMPRESSION: 1. Persistent bilateral patchy airspace opacities suspicious for infection. 2. Slightly increased fullness of the left hilum compared to more remote prior studies, possibly representing underlying adenopathy. Recommend attention on follow-up. Electronically Signed    By: Audie Pinto M.D.   On: 07/16/2019 09:56   Dg Chest Port 1 View  Result Date: 07/15/2019 CLINICAL DATA:  Pneumonia. EXAM: PORTABLE CHEST 1 VIEW COMPARISON:  07/13/2019. FINDINGS: Mediastinum and hilar structures normal. Heart size stable. Patchy multifocal bilateral pulmonary infiltrates are again noted. These have progressed slightly from prior exam. Small bilateral pleural effusions. No pneumothorax. Right humerus plate and screw fixation. IMPRESSION: Interim slight progression of patchy multifocal bilateral pulmonary infiltrates. Small bilateral pleural effusions. Electronically Signed   By: Marcello Moores  Register   On: 07/15/2019 09:02    Darliss Cheney, MD [facility]  To contact the attending provider between 7A-7P or the covering provider during after hours 7P-7A, please log into the web site [email or web address] and access using universal [place] password for that web site. If you do not have the password, please call the hospital operator.   LOS: 9 days

## 2019-07-19 NOTE — Plan of Care (Signed)

## 2019-07-19 NOTE — Progress Notes (Addendum)
NAME:  Melissa [name], MRN:  [id number], DOB:  08-Sep-1928, LOS: 9 ADMISSION DATE:  07/10/2019, CONSULTATION DATE:   REFERRING MD: Doristine Bosworth , CHIEF COMPLAINT: New hypoxic respiratory failure  Brief History   85 year old female patient with multiple medical comorbidities most importantly including history of bronchiectasis (felt either NTM or aspiration related), mild obstructive lung disease with FEV1 69% with 5% improvement following bronchodilators and systolic cardiomyopathy with EF 20%.  Admitted 9/26 with new hypoxic respiratory failure and working diagnosis of community-acquired pneumonia versus aspiration pulmonary asked to see on 9/30 for ongoing supplemental oxygen needs.   Past Medical History  Recurrent pneumonia, chronic cough, bronchiectasis felt possibly either secondary to MAI or chronic aspiration, dysphasia is identified by modified barium swallow August 2019, mild obstructive disease with FEV1 69%, improved to 74% with bronchodilators by spirometry September 2016, and chronic systolic heart failure with ejection fraction of 20%, hypertension, atrial fibrillation on Eliquis, diabetes  Significant Hospital Events   9/26 admitted. Started on oxygen azith and rocephin 9/27 feeling a little better but still short of breath with activity still having cough 9/28: Antibiotics broadened, placed on Zosyn instead of Rocephin to cover for possible aspiration 9/29: Still short of breath down to 3 L nasal cannula working with physical therapy, 9/30: Pulmonary consulted as patient still oxygen dependent. 10/5 still on 5-6 liters CXR not much improved.  Consults:  Pulmonary consult on 9/30  Procedures:    Significant Diagnostic Tests:    Micro Data:  Covid 19 9/26: neg Sputum culture 9/27: Normal flora Respiratory viral panel 9/30:neg Urine strep antigen 9/30 Urine Legionella antigen 9/30 Antimicrobials:  azithromycin 9/26>>> Rocephin 9/26 through 9/27 Zosyn 9/27>>>9/30 augmentin  9/20>>>10/5  Interim history/subjective:  Actually looks better    Objective   Blood pressure 127/69, pulse 98, temperature 98 F (36.7 C), temperature source Oral, resp. rate 19, height 5\' 5"  (1.651 m), weight 68.2 kg, SpO2 100 %.        Intake/Output Summary (Last 24 hours) at 07/19/2019 1504 Last data filed at 07/19/2019 1300 Gross per 24 hour  Intake 240 ml  Output 600 ml  Net -360 ml   Filed Weights   07/11/19 1415  Weight: 68.2 kg    Examination:  General 85 year old frail white female. Sitting up in chair. Not in distress HENT NCAT no JVD MMM Pulm course diffuse rales better appreciated posteriorly  No accessory use  Card af w/ CVR abd not tender + bowel sounds Ext trace ankle edema brisk CR Neuro intact   Resolved Hospital Problem list     Assessment & Plan:   Acute on chronic hypoxic respiratory failure CAP (NOS) bronchiectasis  Dysphagia  Obstructive lung disease w/ BD response  Systolic CM (EF 15%) PAF (on BB and eliquis)  CKD stage III DM type II HL Hypothyroidism Chronic back pain  Anemia of chronic disease Fluid and electrolyte imbalance  Recent death in family.     Acute on chronic hypoxic respiratory failure in the setting of bilateral patchy pulmonary infiltrates with known history of bronchiectasis and mild obstructive lung disease w/ + BD response  -PCXR personally reviewed: no sig change in bilateral diffuse patchy airspace disease.  -Differential diagnosis includes: aspiration PNA vs BTX flare vs atypical infection (we have been concerned about MAI) also suspect that there is an element of edema still -also not convinced that her pulse ox is accurate (on her forehead)  -RVP neg  -Oxygen requirements have been stagnant but she feels  better.  -wbc ct down, no fever    Plan/recommendation ABG now. Would like to see what her SPO2 is and assess if pulse ox reliability Dc augmentin (has had 10 days of abx/ not convinced she is infected or  if it is she hasn't responded to this) Add flutter valve IV lasix 40mg  q 12 Wean oxygen  Cont BDs Repeat CXR in am  Will get induced sputum in am for repeat AFB If CXR not improved may need to consider CT chest +/- FOB  Systolic CM (EF 77%) w/ h/o PAF; Had recurrent RVR 9/29 -currently NSR w/ last QTc 488 Plan    All other issues per IM     Erick Colace ACNP-BC [place] Pager # [phone] OR # [phone] if no answer    ATTESTATION & SIGNATURE   STAFF NOTE: I, Dr Ann Lions have personally reviewed patient's available data, including medical history, events of note, physical examination and test results as part of my evaluation. I have discussed with resident/NP and other care providers such as pharmacist, RN and RRT.  In addition,  I personally evaluated patient and elicited key findings of   S: Date of admit 07/10/2019 with LOS 9 for today 07/19/2019 : Melissa [name] is reports feeling some better but clearly not out of the woods.  She remains on 4 L nasal cannula but when I titrated this down to room air and 20 minutes later she was at 88% on room air.  With 2 L oxygen she is 94% on 2 L.  O:  Blood pressure 120/69, pulse 90, temperature 98.2 F (36.8 C), temperature source Oral, resp. rate (!) 22, height 5\' 5"  (1.651 m), weight 68.2 kg, SpO2 99 %.   Frail elderly female sitting in the bed.  Talking normally and oriented but very deconditioned looking.  Expiration is prolonged some scattered crackles present.  Legs have some edema.  Abdomen is soft.  Normal heart sounds.   Chest x-ray visualized and it appears that she might have some cavitary infiltrates especially compared to 1 year ago   A: Acute on chronic respiratory failure secondary to possible bronchiectasis exacerbation.  Chest x-ray my personal visualization suggest cavitary process  P: CT chest high-resolution -> possible bronchoscopy based on results Oxygen for pulse ox greater than  88% Continue antibiotics for now.   Anti-infectives (From admission, onward)   Start     Dose/Rate Route Frequency Ordered Stop   07/15/19 2200  amoxicillin-clavulanate (AUGMENTIN) 500-125 MG per tablet 500 mg  Status:  Discontinued     1 tablet Oral 2 times daily 07/15/19 1612 07/19/19 1539   07/14/19 1800  amoxicillin-clavulanate (AUGMENTIN) 875-125 MG per tablet 1 tablet  Status:  Discontinued     1 tablet Oral Every 12 hours 07/14/19 1450 07/15/19 1612   07/11/19 1830  cefTRIAXone (ROCEPHIN) 1 g in sodium chloride 0.9 % 100 mL IVPB  Status:  Discontinued     1 g 200 mL/hr over 30 Minutes Intravenous Every 24 hours 07/10/19 1916 07/11/19 0813   07/11/19 1830  azithromycin (ZITHROMAX) 500 mg in sodium chloride 0.9 % 250 mL IVPB  Status:  Discontinued     500 mg 250 mL/hr over 60 Minutes Intravenous Every 24 hours 07/10/19 1916 07/14/19 1500   07/11/19 1100  piperacillin-tazobactam (ZOSYN) IVPB 3.375 g  Status:  Discontinued     3.375 g 12.5 mL/hr over 240 Minutes Intravenous Every 8 hours 07/11/19 1030 07/14/19 1450   07/11/19  0815  piperacillin-tazobactam (ZOSYN) IVPB 3.375 g  Status:  Discontinued     3.375 g 12.5 mL/hr over 240 Minutes Intravenous Every 8 hours 07/11/19 0813 07/11/19 1030   07/10/19 1800  cefTRIAXone (ROCEPHIN) 1 g in sodium chloride 0.9 % 100 mL IVPB     1 g 200 mL/hr over 30 Minutes Intravenous  Once 07/10/19 1749 07/10/19 1922   07/10/19 1800  azithromycin (ZITHROMAX) tablet 500 mg     500 mg Oral  Once 07/10/19 1749 07/10/19 1834       Rest per NP/medical resident whose note is outlined above and that I agree with   Dr. Brand Males, M.D., [facility].C.P Pulmonary and Critical Care Medicine Staff Physician [facility] Pager: [phone], If no answer or between  15:00h - 7:00h: call [phone]  07/19/2019 4:48 PM

## 2019-07-19 NOTE — Progress Notes (Signed)
Physical Therapy Treatment Patient Details Name: Melissa [name] MRN: [id number] DOB: 1928-04-02 Today's Date: 07/19/2019    History of Present Illness Pt adm with acute hypoxic respiratory failure due to bronchopneumonia. PMH - htn, dm, afib, chf, ckd, asthma, arthritis,     PT Comments    Pt is up to walk to chair to get off her back, but is not feeling as energetic as during last PT session.  Has difficulty going more than a few feet to the chair, but once there could reposition her comfortably with minimal difficulty.  Follow acutely for these same needs, and will follow up with home therapy as she is fluctuating with mobility tolerance.  Monitor to see if she is going to need to be downgraded with dc plans.   Follow Up Recommendations  Home health PT;Supervision for mobility/OOB     Equipment Recommendations  None recommended by PT    Recommendations for Other Services       Precautions / Restrictions Precautions Precautions: Fall;Other (comment) Precaution Comments: watch SpO2 Restrictions Weight Bearing Restrictions: No    Mobility  Bed Mobility Overal bed mobility: Needs Assistance Bed Mobility: Supine to Sit     Supine to sit: Min assist     General bed mobility comments: min assist to support trunk up  Transfers Overall transfer level: Needs assistance Equipment used: Rolling walker (2 wheeled);1 person hand held assist Transfers: Sit to/from Stand Sit to Stand: Mod assist         General transfer comment: mod assist to power up and to manage lines to get to chair  Ambulation/Gait Ambulation/Gait assistance: Min assist Gait Distance (Feet): 5 Feet Assistive device: Rolling walker (2 wheeled);1 person hand held assist Gait Pattern/deviations: Step-to pattern;Decreased stride length;Wide base of support Gait velocity: decr Gait velocity interpretation: <1.31 ft/sec, indicative of household ambulator General Gait Details: assist to sidestep and control  descent to chair, very tired with all effort but more comfortable sitting   Stairs             Wheelchair Mobility    Modified Rankin (Stroke Patients Only)       Balance Overall balance assessment: Needs assistance Sitting-balance support: Feet supported Sitting balance-Leahy Scale: Fair     Standing balance support: Bilateral upper extremity supported;During functional activity Standing balance-Leahy Scale: Poor                              Cognition Arousal/Alertness: Awake/alert Behavior During Therapy: WFL for tasks assessed/performed Overall Cognitive Status: Within Functional Limits for tasks assessed                               Problem Solving: Requires verbal cues General Comments: alert and following commands; required redirection      Exercises      General Comments General comments (skin integrity, edema, etc.): pt is up to walk a few steps to get in the chair but is struggling with the effort, O2 sats remain over 90%      Pertinent Vitals/Pain Pain Assessment: Faces Faces Pain Scale: Hurts even more Pain Location: chest, L side Pain Descriptors / Indicators: Sore;Heaviness Pain Intervention(s): Limited activity within patient's tolerance;Monitored during session;Premedicated before session;Repositioned    Home Living                      Prior Function  PT Goals (current goals can now be found in the care plan section) Acute Rehab PT Goals Patient Stated Goal: goal is to return home, she is agreeable to rehab at SNF prior if needed    Frequency    Min 3X/week      PT Plan Current plan remains appropriate    Co-evaluation              AM-PAC PT "6 Clicks" Mobility   Outcome Measure  Help needed turning from your back to your side while in a flat bed without using bedrails?: A Little Help needed moving from lying on your back to sitting on the side of a flat bed without using  bedrails?: A Little Help needed moving to and from a bed to a chair (including a wheelchair)?: A Little Help needed standing up from a chair using your arms (e.g., wheelchair or bedside chair)?: A Lot Help needed to walk in hospital room?: A Little Help needed climbing 3-5 steps with a railing? : Total 6 Click Score: 15    End of Session Equipment Utilized During Treatment: Oxygen;Gait belt Activity Tolerance: Patient tolerated treatment well Patient left: in chair;with call bell/phone within reach;with chair alarm set Nurse Communication: Mobility status PT Visit Diagnosis: Unsteadiness on feet (R26.81);Muscle weakness (generalized) (M62.81)     Time: 2334-3568 PT Time Calculation (min) (ACUTE ONLY): 24 min  Charges:  $Gait Training: 8-22 mins $Therapeutic Activity: 8-22 mins                    Ramond Dial 07/19/2019, 5:41 PM   Mee Hives, PT MS Acute Rehab Dept. Number: [place] and [place]

## 2019-07-19 NOTE — Progress Notes (Signed)
Pt c/o non-radiating chest pain rated a 7 on pain scale. MD paged, EKG performed, received order for Nitro SL. After EKG complete, pt states the pain has subsided. MD notified of change and he requested not to give Nitro SL since pt no longer c/o chest pain. EKG results placed in shadow chart. Will continue to monitor and reassess pain.

## 2019-07-20 ENCOUNTER — Inpatient Hospital Stay (HOSPITAL_COMMUNITY): Payer: Medicare Other

## 2019-07-20 LAB — CBC WITH DIFFERENTIAL/PLATELET
Abs Immature Granulocytes: 0.19 10*3/uL — ABNORMAL HIGH (ref 0.00–0.07)
Basophils Absolute: 0 10*3/uL (ref 0.0–0.1)
Basophils Relative: 0 %
Eosinophils Absolute: 0.3 10*3/uL (ref 0.0–0.5)
Eosinophils Relative: 2 %
HCT: 31.5 % — ABNORMAL LOW (ref 36.0–46.0)
Hemoglobin: 10 g/dL — ABNORMAL LOW (ref 12.0–15.0)
Immature Granulocytes: 1 %
Lymphocytes Relative: 10 %
Lymphs Abs: 1.3 10*3/uL (ref 0.7–4.0)
MCH: 28.3 pg (ref 26.0–34.0)
MCHC: 31.7 g/dL (ref 30.0–36.0)
MCV: 89.2 fL (ref 80.0–100.0)
Monocytes Absolute: 0.9 10*3/uL (ref 0.1–1.0)
Monocytes Relative: 7 %
Neutro Abs: 10.5 10*3/uL — ABNORMAL HIGH (ref 1.7–7.7)
Neutrophils Relative %: 80 %
Platelets: 417 10*3/uL — ABNORMAL HIGH (ref 150–400)
RBC: 3.53 MIL/uL — ABNORMAL LOW (ref 3.87–5.11)
RDW: 13.2 % (ref 11.5–15.5)
WBC: 13.2 10*3/uL — ABNORMAL HIGH (ref 4.0–10.5)
nRBC: 0 % (ref 0.0–0.2)

## 2019-07-20 LAB — GLUCOSE, CAPILLARY
Glucose-Capillary: 109 mg/dL — ABNORMAL HIGH (ref 70–99)
Glucose-Capillary: 131 mg/dL — ABNORMAL HIGH (ref 70–99)
Glucose-Capillary: 250 mg/dL — ABNORMAL HIGH (ref 70–99)
Glucose-Capillary: 294 mg/dL — ABNORMAL HIGH (ref 70–99)

## 2019-07-20 LAB — BASIC METABOLIC PANEL
Anion gap: 13 (ref 5–15)
BUN: 15 mg/dL (ref 8–23)
CO2: 35 mmol/L — ABNORMAL HIGH (ref 22–32)
Calcium: 8.5 mg/dL — ABNORMAL LOW (ref 8.9–10.3)
Chloride: 83 mmol/L — ABNORMAL LOW (ref 98–111)
Creatinine, Ser: 1.27 mg/dL — ABNORMAL HIGH (ref 0.44–1.00)
GFR calc Af Amer: 43 mL/min — ABNORMAL LOW (ref >60)
GFR calc non Af Amer: 37 mL/min — ABNORMAL LOW (ref >60)
Glucose, Bld: 190 mg/dL — ABNORMAL HIGH (ref 70–99)
Potassium: 4.8 mmol/L (ref 3.5–5.1)
Sodium: 131 mmol/L — ABNORMAL LOW (ref 135–145)

## 2019-07-20 LAB — SEDIMENTATION RATE: Sed Rate: 97 mm/hr — ABNORMAL HIGH (ref 0–22)

## 2019-07-20 MED ORDER — SODIUM CHLORIDE 0.9 % IV SOLN
2.0000 g | INTRAVENOUS | Status: DC
  Administered 2019-07-20 – 2019-07-26 (×7): 2 g via INTRAVENOUS
  Filled 2019-07-20 (×7): qty 2

## 2019-07-20 MED ORDER — SODIUM CHLORIDE 3 % IN NEBU
4.0000 mL | INHALATION_SOLUTION | Freq: Once | RESPIRATORY_TRACT | Status: AC
  Administered 2019-07-20: 4 mL via RESPIRATORY_TRACT
  Filled 2019-07-20: qty 4

## 2019-07-20 MED ORDER — METHYLPREDNISOLONE SODIUM SUCC 40 MG IJ SOLR
40.0000 mg | Freq: Two times a day (BID) | INTRAMUSCULAR | Status: DC
  Administered 2019-07-20 – 2019-07-26 (×13): 40 mg via INTRAVENOUS
  Filled 2019-07-20 (×13): qty 1

## 2019-07-20 MED ORDER — VANCOMYCIN HCL 10 G IV SOLR
1250.0000 mg | INTRAVENOUS | Status: DC
  Administered 2019-07-20: 1250 mg via INTRAVENOUS
  Filled 2019-07-20 (×2): qty 1250

## 2019-07-20 NOTE — Progress Notes (Signed)
Pharmacy Antibiotic Note  Melissa [name] is a 85 y.o. female admitted on 07/10/2019 with recurrent PNA, recently completed antibiotic course 10/5 with Augmentin.  Pharmacy has been consulted for vancomycin/cefepime dosing. SCr 1.27, up a bit today but relatively stable overall this admission.  Plan: Cefepime 2g IV q24h Vancomycin 1250mg  IV Q 48 hrs. Goal AUC 400-550. Expected AUC: 474 SCr used: 1.27 Monitor clinical progress, c/s, renal function F/u de-escalation plan/LOT, vancomycin levels as indicated   Height: 5\' 5"  (165.1 cm) Weight: 150 lb 6.4 oz (68.2 kg) IBW/kg (Calculated) : 57  Temp (24hrs), Avg:98.3 F (36.8 C), Min:98 F (36.7 C), Max:98.5 F (36.9 C)  Recent Labs  Lab 07/15/19 0444 07/16/19 0456 07/17/19 0511 07/18/19 0752 07/18/19 0937 07/19/19 0820 07/20/19 0334 07/20/19 0934  WBC 14.1* 12.7* 13.6* 16.9*  --  13.5* 13.2*  --   CREATININE 1.31* 1.18*  --   --  1.11* 1.11*  --  1.27*    Estimated Creatinine Clearance: 27 mL/min (A) (by C-G formula based on SCr of 1.27 mg/dL (H)).    No Known Allergies  Antimicrobials this admission: 10/6 vancomycin >>  10/6 cefepime >>  azithromycin 9/26>>>9/29 Rocephin 9/26 >> 9/27 Zosyn 9/27>>>9/30 augmentin 9/20>>>10/5  Dose adjustments this admission:   Microbiology results:   Elicia Lamp, PharmD, BCPS Clinical Pharmacist 07/20/2019 10:20 AM

## 2019-07-20 NOTE — Progress Notes (Signed)
PROGRESS NOTE  Melissa [name]:[id number] DOB: 10-Sep-1928 DOA: 07/10/2019 PCP: [name], MD  Brief History:  85 y.o. female with medical history significant for chronic systolic CHF (91%), DM2, HTN, Afib on Eliquis, and HLD who presented to the ED with c/o cough for the past 2 weeks. Pt has a h/o recurrent pneumonia and chronic cough and typically has recurrences about this time every year. She is followed by Dr. [name] in [facility] and was last seen there in July 2019. High res CT in Sept 2019 showed bronchiectatic changes and scarring, with concern for possible MAI vs COP vs recurrent aspiration. She had been doing fairly well since then except for chronic dry cough. 2 weeks PTA cough began to worsen, and became productive of thick mucous. She has had no fevers. She was seen in clinic (in the parking lot) on Tuesday of week PTA and started on a Z pack. Had COVID test on Wed at urgent care which was negative. Despite azithromycin she worsened and was coughing so badly that she was having frequent post-tussive emesis.  Because of worsening cough and shortness of breath, the patient's daughter brought her to the emergency department for further evaluation. She was admitted for Acute respiratory failure d/t bronchopneumonia complicating underlying bronchiectasis.  She was continued on antibiotics however she did not improve.  Repeat chest x-ray on 07/13/2019 showed worsening of bilateral patchy opacities.  PCCM was consulted on 07/14/2019.  Early morning of 07/15/2019, patient went into atrial fibrillation with RVR.  She did not respond to diltiazem bolus and was started on amiodarone drip.  Cardiology was consulted on 07/15/2019.  She was continued on amiodarone drip for approximately 24 hours and then it was switched to oral amiodarone.  Assessment/Plan: Acute respiratory failure with hypoxia/bronchopneumonia/bronchiectasis -Secondary to pneumonia complicating underlying bronchiectasis.   Covid negative.  Chest x-ray shows bilateral multiple patchy ill-defined opacities.  Repeat chest x-ray from 07/13/2019 shows worsening patchy bilateral infiltrates.  Repeat chest x-ray from 07/19/2019 shows stable lung opacities.  Seen by PCCM.  Zosyn and azithromycin were switched to oral Augmentin by PCCM on 07/14/2019 which were subsequently discontinued on 07/19/2019.  She continues to require 4 to 5 L of oxygen and has waxing and waning shortness of breath.  Continue Xopenex, Atrovent and Pulmicort and flutter valve.MBS on 05/19/2018 showed mild aspiration risk.  Procalcitonin only 0.16.  PCCM on board. ?  Does she need bronchoscopy.  Will defer to them.  Appreciate their help.  Paroxysmal atrial fibrillation Went into rapid ventricular response 2 AM on 07/15/2019.  no results from diltiazem bolus, then drip which caused hypotension so she was started on amiodarone drip.  Cardiology was consulted on 07/15/2019.  She was continued on amiodarone drip which was switched to oral amiodarone on 07/16/2019.  She remains in a normal sinus rhythm.  Continue Eliquis.  Diabetes mellitus type 2 -05/05/2019 hemoglobin A1c 6.1.  Blood sugar fairly controlled.  Continue 12 units of Levemir along with SSI.  CKD stage III -Baseline creatinine 1.2-1.5.  Currently at baseline.  Continue to watch.  Chronic systolic CHF -6/38/4665 echo EF 15-20%, trivial MR. euvolemic. -Was switched from hydrocodone home dose of furosemide to 40 mg IV twice daily on 07/19/2019 by PCCM. -Continue metoprolol succinate - Sees [name], Cardiology as OP.  Hyperlipidemia -Continue statin and Lovaza.  Hypothyroidism -Continue Synthroid -July 2020 TSH 1.72  Chronic back pain - Lidocaine patch as per reported home regimen.  Hypomagnesemia - resolved.  Anemia, suspect chronic disease - Follow CBC's.  Stable.  Disposition Plan:  DC home pending clinical improvement, Family Communication: None present at bedside.  Plan of care  discussed with patient.  Consultants: PCCM on 07/14/2019 and cardiology on 07/15/2019  Code Status:  FULL  DVT Prophylaxis:  apixaban  Procedures: None  Antibiotics: Zosyn 9/27>>> 07/14/2019 Oral Augmentin 07/14/2019> 07/19/2019  Subjective: Patient seen and examined.  Slightly lethargic today.  Continues to complain of shortness of breath.  Looks comfortable.  Currently on 4 to 5 L of oxygen.  Objective: Vitals:   07/20/19 0401 07/20/19 0719 07/20/19 0737 07/20/19 0944  BP:  (!) 109/48    Pulse:  91    Resp:  18    Temp:  98.3 F (36.8 C)    TempSrc:  Oral    SpO2: 95% 93% 95% 90%  Weight:      Height:        Intake/Output Summary (Last 24 hours) at 07/20/2019 1100 Last data filed at 07/20/2019 0600 Gross per 24 hour  Intake -  Output 2200 ml  Net -2200 ml   Weight change:  Exam:  General exam: Appears slightly lethargic Respiratory system: Coarse breath sounds with rhonchi bilaterally. Respiratory effort normal. Cardiovascular system: S1 & S2 heard, RRR. No JVD, murmurs, rubs, gallops or clicks. No pedal edema. Gastrointestinal system: Abdomen is nondistended, soft and nontender. No organomegaly or masses felt. Normal bowel sounds heard. Central nervous system: Alert and oriented. No focal neurological deficits. Extremities: Symmetric 5 x 5 power. Skin: No rashes, lesions or ulcers.  Psychiatry: Judgement and insight appear poor. Mood & affect flat.   Data Reviewed: I have personally reviewed following labs and imaging studies Basic Metabolic Panel: Recent Labs  Lab 07/15/19 0444 07/16/19 0456 07/18/19 0937 07/19/19 0820 07/20/19 0934  NA 135 135 132* 135 131*  K 3.6 4.1 4.6 4.1 4.8  CL 93* 92* 87* 88* 83*  CO2 31 32 33* 36* 35*  GLUCOSE 110* 103* 205* 116* 190*  BUN 10 10 12 11 15   CREATININE 1.31* 1.18* 1.11* 1.11* 1.27*  CALCIUM 8.3* 8.1* 8.3* 8.3* 8.5*  MG 1.5*  --   --   --   --    Liver Function Tests: No results for input(s): AST, ALT,  ALKPHOS, BILITOT, PROT, ALBUMIN in the last 168 hours. CBC: Recent Labs  Lab 07/16/19 0456 07/17/19 0511 07/18/19 0752 07/19/19 0820 07/20/19 0334  WBC 12.7* 13.6* 16.9* 13.5* 13.2*  NEUTROABS 9.2* 10.3* 13.6* 10.7* 10.5*  HGB 10.2* 11.1* 11.6* 10.7* 10.0*  HCT 32.6* 35.1* 36.7 34.3* 31.5*  MCV 91.6 90.5 90.2 91.2 89.2  PLT 325 365 399 398 417*   Cardiac Enzymes: No results for input(s): CKTOTAL, CKMB, CKMBINDEX, TROPONINI in the last 168 hours. BNP: Invalid input(s): POCBNP CBG: Recent Labs  Lab 07/18/19 2110 07/19/19 0725 07/19/19 1129 07/19/19 1611 07/19/19 2105  GLUCAP 151* 112* 158* 161* 210*     Recent Results (from the past 240 hour(s))  SARS Coronavirus 2 [facility] order, Performed in [facility] hospital lab) Nasopharyngeal Nasopharyngeal Swab     Status: None   Collection Time: 07/10/19  6:46 PM   Specimen: Nasopharyngeal Swab  Result Value Ref Range Status   SARS Coronavirus 2 NEGATIVE NEGATIVE Final    Comment: (NOTE) If result is NEGATIVE SARS-CoV-2 target nucleic acids are NOT DETECTED. The SARS-CoV-2 RNA is generally detectable in upper and lower  respiratory specimens during the acute phase of infection.  The lowest  concentration of SARS-CoV-2 viral copies this assay can detect is 250  copies / mL. A negative result does not preclude SARS-CoV-2 infection  and should not be used as the sole basis for treatment or other  patient management decisions.  A negative result may occur with  improper specimen collection / handling, submission of specimen other  than nasopharyngeal swab, presence of viral mutation(s) within the  areas targeted by this assay, and inadequate number of viral copies  (<250 copies / mL). A negative result must be combined with clinical  observations, patient history, and epidemiological information. If result is POSITIVE SARS-CoV-2 target nucleic acids are DETECTED. The SARS-CoV-2 RNA is generally detectable in upper and lower   respiratory specimens dur ing the acute phase of infection.  Positive  results are indicative of active infection with SARS-CoV-2.  Clinical  correlation with patient history and other diagnostic information is  necessary to determine patient infection status.  Positive results do  not rule out bacterial infection or co-infection with other viruses. If result is PRESUMPTIVE POSTIVE SARS-CoV-2 nucleic acids MAY BE PRESENT.   A presumptive positive result was obtained on the submitted specimen  and confirmed on repeat testing.  While 2019 novel coronavirus  (SARS-CoV-2) nucleic acids may be present in the submitted sample  additional confirmatory testing may be necessary for epidemiological  and / or clinical management purposes  to differentiate between  SARS-CoV-2 and other Sarbecovirus currently known to infect humans.  If clinically indicated additional testing with an alternate test  methodology [id number]) is advised. The SARS-CoV-2 RNA is generally  detectable in upper and lower respiratory sp ecimens during the acute  phase of infection. The expected result is Negative. Fact Sheet for Patients:  StrictlyIdeas.no Fact Sheet for Healthcare Providers: BankingDealers.co.za This test is not yet approved or cleared by the [place] FDA and has been authorized for detection and/or diagnosis of SARS-CoV-2 by FDA under an Emergency Use Authorization (EUA).  This EUA will remain in effect (meaning this test can be used) for the duration of the COVID-19 declaration under Section 564(b)(1) of the Act, 21 U.S.C. section 360bbb-3(b)(1), unless the authorization is terminated or revoked sooner. Performed at [facility], [place] [address], [place], [place] [address]   Culture, sputum-assessment     Status: None   Collection Time: 07/11/19  1:13 PM   Specimen: Sputum  Result Value Ref Range Status   Specimen Description SPU  Final    Special Requests NONE  Final   Sputum evaluation   Final    THIS SPECIMEN IS ACCEPTABLE FOR SPUTUM CULTURE Performed at [facility], [address], [place], [place] [address]    Report Status 07/11/2019 FINAL  Final  Culture, respiratory     Status: None   Collection Time: 07/11/19  1:13 PM   Specimen: Sputum  Result Value Ref Range Status   Specimen Description SPU  Final   Special Requests NONE Reflexed from [id number]  Final   Gram Stain   Final    ABUNDANT WBC PRESENT, PREDOMINANTLY PMN NO ORGANISMS SEEN    Culture   Final    Consistent with normal respiratory flora. Performed at [facility], [place] [address], [place], [place] [address]    Report Status 07/13/2019 FINAL  Final  Respiratory Panel by PCR     Status: None   Collection Time: 07/14/19  7:35 PM   Specimen: Nasopharyngeal Swab; Respiratory  Result Value Ref Range  Status   Adenovirus NOT DETECTED NOT DETECTED Final   Coronavirus 229E NOT DETECTED NOT DETECTED Final    Comment: (NOTE) The Coronavirus on the Respiratory Panel, DOES NOT test for the novel  Coronavirus (2019 nCoV)    Coronavirus HKU1 NOT DETECTED NOT DETECTED Final   Coronavirus NL63 NOT DETECTED NOT DETECTED Final   Coronavirus OC43 NOT DETECTED NOT DETECTED Final   Metapneumovirus NOT DETECTED NOT DETECTED Final   Rhinovirus / Enterovirus NOT DETECTED NOT DETECTED Final   Influenza A NOT DETECTED NOT DETECTED Final   Influenza B NOT DETECTED NOT DETECTED Final   Parainfluenza Virus 1 NOT DETECTED NOT DETECTED Final   Parainfluenza Virus 2 NOT DETECTED NOT DETECTED Final   Parainfluenza Virus 3 NOT DETECTED NOT DETECTED Final   Parainfluenza Virus 4 NOT DETECTED NOT DETECTED Final   Respiratory Syncytial Virus NOT DETECTED NOT DETECTED Final   Bordetella pertussis NOT DETECTED NOT DETECTED Final   Chlamydophila pneumoniae NOT DETECTED NOT DETECTED Final   Mycoplasma pneumoniae NOT DETECTED NOT DETECTED Final    Comment: Performed at  [facility], [place] [address] [place], [place] [address]     Scheduled Meds: . amiodarone  200 mg Oral BID   Followed by  . [START ON 07/30/2019] amiodarone  200 mg Oral Daily  . apixaban  5 mg Oral BID  . atorvastatin  40 mg Oral Daily  . cholecalciferol  1,000 Units Oral Daily  . FLUoxetine  20 mg Oral Daily  . furosemide  40 mg Intravenous Q12H  . influenza vaccine adjuvanted  0.5 mL Intramuscular Tomorrow-1000  . insulin aspart  0-5 Units Subcutaneous QHS  . insulin aspart  0-9 Units Subcutaneous TID WC  . insulin detemir  12 Units Subcutaneous QHS  . ipratropium  0.5 mg Nebulization TID  . levalbuterol  0.63 mg Nebulization TID  . levothyroxine  50 mcg Oral Daily  . lidocaine  1 patch Transdermal Q24H  . methylPREDNISolone (SOLU-MEDROL) injection  40 mg Intravenous Q12H  . metoprolol succinate  12.5 mg Oral Daily  . multivitamin  1 tablet Oral BID  . multivitamins with iron  1 tablet Oral Daily  . omega-3 acid ethyl esters  1,000 mg Oral Daily  . pantoprazole  40 mg Oral Daily  . pramipexole  0.75 mg Oral QHS  . pregabalin  50 mg Oral TID  . traZODone  100 mg Oral QHS  . umeclidinium bromide  1 puff Inhalation Daily   Continuous Infusions: . ceFEPime (MAXIPIME) IV    . vancomycin      Procedures/Studies: Dg Chest 2 View  Result Date: 07/13/2019 CLINICAL DATA:  Shortness of breath for 2 weeks. Recent diagnosis of pneumonia. EXAM: CHEST - 2 VIEW COMPARISON:  PA and lateral chest 07/10/2019. FINDINGS: Patchy bilateral airspace disease has worsened, particularly in the left lung base. There is a small left pleural effusion. Heart size is normal. No pneumothorax. Aortic atherosclerosis is noted. IMPRESSION: Worsened patchy bilateral airspace disease most consistent with pneumonia. Atypical/viral infection is possible. New small left pleural effusion. Atherosclerosis. Electronically Signed   By: Inge Rise M.D.   On: 07/13/2019 15:01   Dg Chest 2 View  Result  Date: 07/10/2019 CLINICAL DATA:  Shortness of breath. EXAM: CHEST - 2 VIEW COMPARISON:  Radiograph of Feb 27, 2018. CT scan of March 27, 2018 and July 08, 2018. FINDINGS: The heart size and mediastinal contours are within normal limits. No pneumothorax or pleural effusion is noted. There are  multiple ill-defined opacities throughout both lungs, some of which were present on prior exam and some of which are new. The visualized skeletal structures are unremarkable. IMPRESSION: Multiple ill-defined opacities are noted throughout both lungs which are consistent with recurrent bronchopneumonia as described on prior CT scan. Aortic Atherosclerosis (ICD10-I70.0). Electronically Signed   By: Marijo Conception M.D.   On: 07/10/2019 15:37   Ct Chest High Resolution  Result Date: 07/20/2019 CLINICAL DATA:  85 year old female with history of shortness of breath. Evaluate for interstitial lung disease. EXAM: CT CHEST WITHOUT CONTRAST TECHNIQUE: Multidetector CT imaging of the chest was performed following the standard protocol without intravenous contrast. High resolution imaging of the lungs, as well as inspiratory and expiratory imaging, was performed. COMPARISON:  High-resolution chest CT 07/08/2018. FINDINGS: Cardiovascular: Heart size is normal. There is no significant pericardial fluid, thickening or pericardial calcification. There is aortic atherosclerosis, as well as atherosclerosis of the great vessels of the mediastinum and the coronary arteries, including calcified atherosclerotic plaque in the left main, left anterior descending, left circumflex and right coronary arteries. Mediastinum/Nodes: Multiple prominent borderline enlarged mediastinal and bilateral hilar lymph nodes are noted, nonspecific and likely reactive. Esophagus is unremarkable in appearance. No axillary lymphadenopathy. Lungs/Pleura: Patchy areas of peribronchovascular predominant ground-glass attenuation, consolidation and septal thickening  noted throughout all aspects of the lungs bilaterally, most compatible with active bronchopneumonia. In the less affected regions of the lungs there is no significant septal thickening, subpleural reticulation, traction bronchiectasis or frank honeycombing to clearly indicate underlying interstitial lung disease. Inspiratory and expiratory imaging is unremarkable. Small bilateral pleural effusions lying dependently with some associated passive subsegmental atelectasis in the lower lobes of the lungs bilaterally. Upper Abdomen: Mild diffuse low attenuation throughout the visualized hepatic parenchyma, indicative of hepatic steatosis. Status post cholecystectomy. Musculoskeletal: There are no aggressive appearing lytic or blastic lesions noted in the visualized portions of the skeleton. IMPRESSION: 1. Findings in the lungs are compatible with severe multilobar bronchopneumonia. No definite imaging findings to indicate underlying interstitial lung disease. 2. Small bilateral pleural effusions. 3. Aortic atherosclerosis, in addition to left main and 3 vessel coronary artery disease. 4. Hepatic steatosis. Aortic Atherosclerosis (ICD10-I70.0). Electronically Signed   By: Vinnie Langton M.D.   On: 07/20/2019 10:00   Dg Chest Port 1 View  Result Date: 07/20/2019 CLINICAL DATA:  Cough and shortness-of-breath. EXAM: PORTABLE CHEST 1 VIEW COMPARISON:  07/19/2019 FINDINGS: Lungs are somewhat hypoinflated demonstrate no significant change in patchy bilateral airspace process. No definite effusion. Cardiomediastinal silhouette and remainder of the exam is unchanged. IMPRESSION: Stable patchy bilateral airspace process likely multifocal pneumonia. Electronically Signed   By: Marin Olp M.D.   On: 07/20/2019 10:06   Dg Chest Port 1 View  Result Date: 07/19/2019 CLINICAL DATA:  Shortness of breath, pneumonia. EXAM: PORTABLE CHEST 1 VIEW COMPARISON:  Radiograph of July 16, 2019. FINDINGS: Stable cardiomediastinal  silhouette. Stable bilateral lung opacities are noted consistent with multifocal pneumonia. Small pleural effusions may be present. Bony thorax is unremarkable. No pneumothorax is noted. IMPRESSION: Stable bilateral lung opacities are noted consistent with multifocal pneumonia. Electronically Signed   By: Marijo Conception M.D.   On: 07/19/2019 08:41   Dg Chest Port 1 View  Result Date: 07/16/2019 CLINICAL DATA:  : Pneumonia ,cough EXAM: PORTABLE CHEST 1 VIEW COMPARISON:  Chest radiograph 07/15/2019 FINDINGS: Stable cardiomediastinal contours with mildly enlarged heart size. Persistent bilateral diffuse patchy airspace opacities suspicious for infection. There is mildly increased fullness of the left hilum  compared to more remote prior studies. No pneumothorax or large pleural effusion. No acute finding in the visualized skeleton. Surgical hardware noted in the right proximal humerus. IMPRESSION: 1. Persistent bilateral patchy airspace opacities suspicious for infection. 2. Slightly increased fullness of the left hilum compared to more remote prior studies, possibly representing underlying adenopathy. Recommend attention on follow-up. Electronically Signed   By: Audie Pinto M.D.   On: 07/16/2019 09:56   Dg Chest Port 1 View  Result Date: 07/15/2019 CLINICAL DATA:  Pneumonia. EXAM: PORTABLE CHEST 1 VIEW COMPARISON:  07/13/2019. FINDINGS: Mediastinum and hilar structures normal. Heart size stable. Patchy multifocal bilateral pulmonary infiltrates are again noted. These have progressed slightly from prior exam. Small bilateral pleural effusions. No pneumothorax. Right humerus plate and screw fixation. IMPRESSION: Interim slight progression of patchy multifocal bilateral pulmonary infiltrates. Small bilateral pleural effusions. Electronically Signed   By: Marcello Moores  Register   On: 07/15/2019 09:02    Darliss Cheney, MD [facility]  To contact the attending provider between 7A-7P or the covering  provider during after hours 7P-7A, please log into the web site [email or web address] and access using universal [place] password for that web site. If you do not have the password, please call the hospital operator.   LOS: 10 days

## 2019-07-20 NOTE — Progress Notes (Signed)
Occupational Therapy Treatment Patient Details Name: Melissa [name] MRN: [id number] DOB: Nov 11, 1927 Today's Date: 07/20/2019    History of present illness Pt adm with acute hypoxic respiratory failure due to bronchopneumonia. PMH - htn, dm, afib, chf, ckd, asthma, arthritis,    OT comments   Pt MIN A to transition from supine >EOB; increased assist to scoot hips forward and elevate trunk. Pt set-up / supervision for seated ADLs at EOB. Pt experienced coughing episode during ADLs with O2 sats dropping into low 80s on 3L. Increased SaO2 to 4L during episode. Pt requesting to lay down during episode d/t fatigue. Ended session with pt supine on 3L SaO2 94%. Pt continues to be limited by weakness, decreased activity tolerance and SOB with exertion. DC plan remains appropriate. Will continue to follow acutely for OT needs/    Follow Up Recommendations  Home health OT;SNF;Supervision/Assistance - 24 hour    Equipment Recommendations  None recommended by OT    Recommendations for Other Services      Precautions / Restrictions Precautions Precautions: Fall;Other (comment) Precaution Comments: watch SpO2 Restrictions Weight Bearing Restrictions: No       Mobility Bed Mobility Overal bed mobility: Needs Assistance Bed Mobility: Supine to Sit     Supine to sit: Min assist     General bed mobility comments: MIN A to elevate trunk and scoot hips forward  Transfers                 General transfer comment: declined OOB transfer d/t fatigue and SOB    Balance Overall balance assessment: Needs assistance Sitting-balance support: Feet supported Sitting balance-Leahy Scale: Fair Sitting balance - Comments: able to static sit                                   ADL either performed or assessed with clinical judgement   ADL Overall ADL's : Needs assistance/impaired     Grooming: Set up;Sitting;Wash/dry face;Wash/dry hands;Supervision/safety Grooming Details  (indicate cue type and reason): MIN guard for safety when sitting EOB to wash face; pt experiene coughing episode causing pt to need to lay back down                   Toilet Transfer Details (indicate cue type and reason): declined OOB transfer d/t fatigue           General ADL Comments: Pt with limited activity tolerance and SOB with mobility and exertion. Pt sat EOB to wash face ~ 3 minutes before experiencing coughing episode needing to lay back down     Vision   Vision Assessment?: No apparent visual deficits   Perception     Praxis      Cognition Arousal/Alertness: Awake/alert Behavior During Therapy: WFL for tasks assessed/performed Overall Cognitive Status: Within Functional Limits for tasks assessed                                          Exercises     Shoulder Instructions       General Comments pt 90% on 3L SaO2 upon arrival; pt dropped to low 80s during persistent coughing epsiode; bumped up pt to 4L during episode; at end of session pt 94% back on 3L SaO2    Pertinent Vitals/ Pain       Pain Assessment: No/denies  pain  Home Living                                          Prior Functioning/Environment              Frequency  Min 2X/week        Progress Toward Goals  OT Goals(current goals can now be found in the care plan section)  Progress towards OT goals: Progressing toward goals  Acute Rehab OT Goals Patient Stated Goal: goal is to return home, she is agreeable to rehab at SNF prior if needed OT Goal Formulation: With patient Time For Goal Achievement: 07/28/19 Potential to Achieve Goals: Good  Plan Discharge plan remains appropriate    Co-evaluation                 AM-PAC OT "6 Clicks" Daily Activity     Outcome Measure   Help from another person eating meals?: None Help from another person taking care of personal grooming?: A Little Help from another person toileting, which  includes using toliet, bedpan, or urinal?: A Little Help from another person bathing (including washing, rinsing, drying)?: A Little Help from another person to put on and taking off regular upper body clothing?: A Little Help from another person to put on and taking off regular lower body clothing?: A Little 6 Click Score: 19    End of Session Equipment Utilized During Treatment: Oxygen  OT Visit Diagnosis: Unsteadiness on feet (R26.81);Muscle weakness (generalized) (M62.81)   Activity Tolerance Patient limited by fatigue   Patient Left in bed;with call bell/phone within reach   Nurse Communication Mobility status        Time: 7412-8786 OT Time Calculation (min): 20 min  Charges: OT General Charges $OT Visit: 1 Visit OT Treatments $Self Care/Home Management : 8-22 mins [place], [place] [phone] [place] 07/20/2019, 9:50 AM

## 2019-07-20 NOTE — Procedures (Signed)
Sputum induction done.  RN aware sputum obtained, she will send down to lab.

## 2019-07-20 NOTE — Progress Notes (Signed)
eLink Physician-Brief Progress Note Patient Name: Melissa [name] DOB: 1928-02-27 MRN: [id number]   Date of Service  07/20/2019  HPI/Events of Note  Request for nebulizer 3% saline for sputum induction.  eICU Interventions  Will order nebulized 3% saline X 1.      Intervention Category Major Interventions: Other:  Melissa [name] 07/20/2019, 2:39 AM

## 2019-07-20 NOTE — Progress Notes (Signed)
NAME:  Melissa [name], MRN:  [id number], DOB:  1927-11-28, LOS: 106 ADMISSION DATE:  07/10/2019, CONSULTATION DATE:   REFERRING MD: Doristine Bosworth , CHIEF COMPLAINT: New hypoxic respiratory failure  Brief History   85 year old female patient with multiple medical comorbidities most importantly including history of bronchiectasis (felt either NTM or aspiration related), mild obstructive lung disease with FEV1 69% with 5% improvement following bronchodilators and systolic cardiomyopathy with EF 20%.  Admitted 9/26 with new hypoxic respiratory failure and working diagnosis of community-acquired pneumonia versus aspiration pulmonary asked to see on 9/30 for ongoing supplemental oxygen needs.   Past Medical History  Recurrent pneumonia, chronic cough, bronchiectasis felt possibly either secondary to MAI or chronic aspiration, dysphasia is identified by modified barium swallow August 2019, mild obstructive disease with FEV1 69%, improved to 74% with bronchodilators by spirometry September 2016, and chronic systolic heart failure with ejection fraction of 20%, hypertension, atrial fibrillation on Eliquis, diabetes  Significant Hospital Events   9/26 admitted. Started on oxygen azith and rocephin 9/27 feeling a little better but still short of breath with activity still having cough 9/28: Antibiotics broadened, placed on Zosyn instead of Rocephin to cover for possible aspiration 9/29: Still short of breath down to 3 L nasal cannula working with physical therapy, 9/30: Pulmonary consulted as patient still oxygen dependent. 10/5 still on 5-6 liters CXR not much improved. abx stopped.  10/6 cxr worse. No sig fever spike. CBC about the same. CT chest ordered.  Consults:  Pulmonary consult on 9/30  Procedures:    Significant Diagnostic Tests:  CT chest 10/6>>>  Micro Data:  Covid 19 9/26: neg Sputum culture 9/27: Normal flora Respiratory viral panel 9/30:neg Urine strep antigen 9/30 Urine Legionella  antigen 9/30 Induced sputum for afb 10/6>>> Antimicrobials:  azithromycin 9/26>>> Rocephin 9/26 through 9/27 Zosyn 9/27>>>9/30 augmentin 9/20>>>10/5  Interim history/subjective:  Feels about the same   Objective   Blood pressure (Abnormal) 109/48, pulse 91, temperature 98.3 F (36.8 C), temperature source Oral, resp. rate 18, height 5\' 5"  (1.651 m), weight 68.2 kg, SpO2 95 %.        Intake/Output Summary (Last 24 hours) at 07/20/2019 0842 Last data filed at 07/20/2019 0600 Gross per 24 hour  Intake no documentation  Output 2200 ml  Net -2200 ml   Filed Weights   07/11/19 1415  Weight: 68.2 kg    Examination: General this is a frail 85 year old white female who is resting in bed she is in no acute distress but clinically has not really improved over the last several days. HEENT normocephalic atraumatic no jugular venous distention appreciated mucous membranes are moist sclera nonicteric posterior pharynx pink without exudate does have mild audible upper airway wheezing Pulmonary: Diffuse rales, posterior wheezing, no significant accessory use but does endorse dyspnea with exertion Cardiac: Regular irregular with atrial fibrillation on telemetry Abdomen soft nontender no organomegaly Extremities warm and dry brisk capillary refill trace ankle edema Neuro awake oriented no focal deficits   Resolved Hospital Problem list     Assessment & Plan:   Acute on chronic hypoxic respiratory failure CAP (NOS) bronchiectasis  Dysphagia  Obstructive lung disease w/ BD response  Systolic CM (EF 30%) PAF (on BB and eliquis)  CKD stage III DM type II HL Hypothyroidism Chronic back pain  Anemia of chronic disease Fluid and electrolyte imbalance  Recent death in family.     Acute on chronic hypoxic respiratory failure in the setting of bilateral patchy pulmonary infiltrates with known history of  bronchiectasis and mild obstructive lung disease w/ + BD response .  -Differential  diagnosis includes: aspiration PNA vs BTX flare vs atypical infection (we have been concerned about MAI) also suspect that there is an element of edema  -abx stopped 10/5; wbc still trending down; no fever spikes -now -1 liter after escalation of diuretics.  -now on 3 liters; had been on 6 on 10/5; although there was concern about accuracy of pulse ox from forehead PCXR worsening patchy airspace disease when comparing 10/5 -CT imaging: pending  Plan/recommendation Cont supplemental oxygen and BDs Awaiting CT chest results Cont lasix F/u chemistry today  Cont flutter Although WBC ct a little better cxr worse and concern about cavitary changes; Induced AFB sent this am.  Will need to add back abx; covering for more resistant organisms; also would benefit for steroids (seems to have more bronchospasm; although a component of this is upper airway). Will d/w Dr Chase Caller when to start as I think we  need to consider FOB.   Systolic CM (EF 21%) w/ h/o PAF; Had recurrent RVR 9/29 -currently NSR w/ last QTc 488 Plan Cont tele  Cont lasix as above amio & BB per cards Cont [place] ACNP-BC [place] Pager # [phone] OR # [phone] if no answer  07/20/2019 8:42 AM

## 2019-07-21 LAB — ACID FAST SMEAR (AFB, MYCOBACTERIA): Acid Fast Smear: NEGATIVE

## 2019-07-21 LAB — CBC WITH DIFFERENTIAL/PLATELET
Abs Immature Granulocytes: 0.14 10*3/uL — ABNORMAL HIGH (ref 0.00–0.07)
Basophils Absolute: 0 10*3/uL (ref 0.0–0.1)
Basophils Relative: 0 %
Eosinophils Absolute: 0 10*3/uL (ref 0.0–0.5)
Eosinophils Relative: 0 %
HCT: 33 % — ABNORMAL LOW (ref 36.0–46.0)
Hemoglobin: 10.7 g/dL — ABNORMAL LOW (ref 12.0–15.0)
Immature Granulocytes: 1 %
Lymphocytes Relative: 6 %
Lymphs Abs: 0.7 10*3/uL (ref 0.7–4.0)
MCH: 28.8 pg (ref 26.0–34.0)
MCHC: 32.4 g/dL (ref 30.0–36.0)
MCV: 88.9 fL (ref 80.0–100.0)
Monocytes Absolute: 0.1 10*3/uL (ref 0.1–1.0)
Monocytes Relative: 1 %
Neutro Abs: 12 10*3/uL — ABNORMAL HIGH (ref 1.7–7.7)
Neutrophils Relative %: 92 %
Platelets: 462 10*3/uL — ABNORMAL HIGH (ref 150–400)
RBC: 3.71 MIL/uL — ABNORMAL LOW (ref 3.87–5.11)
RDW: 13.1 % (ref 11.5–15.5)
WBC: 12.9 10*3/uL — ABNORMAL HIGH (ref 4.0–10.5)
nRBC: 0 % (ref 0.0–0.2)

## 2019-07-21 LAB — ANTINUCLEAR ANTIBODIES, IFA: ANA Ab, IFA: NEGATIVE

## 2019-07-21 LAB — SJOGRENS SYNDROME-B EXTRACTABLE NUCLEAR ANTIBODY: SSB (La) (ENA) Antibody, IgG: <0.2 AI (ref 0.0–0.9)

## 2019-07-21 LAB — GLUCOSE, CAPILLARY
Glucose-Capillary: 168 mg/dL — ABNORMAL HIGH (ref 70–99)
Glucose-Capillary: 215 mg/dL — ABNORMAL HIGH (ref 70–99)
Glucose-Capillary: 269 mg/dL — ABNORMAL HIGH (ref 70–99)
Glucose-Capillary: 268 mg/dL — ABNORMAL HIGH (ref 70–99)

## 2019-07-21 LAB — ANTI-SCLERODERMA ANTIBODY: Scleroderma (Scl-70) (ENA) Antibody, IgG: <0.2 AI (ref 0.0–0.9)

## 2019-07-21 LAB — GLOMERULAR BASEMENT MEMBRANE ANTIBODIES: GBM Ab: 3 units (ref 0–20)

## 2019-07-21 LAB — RHEUMATOID FACTOR: Rheumatoid fact SerPl-aCnc: 21.9 IU/mL — ABNORMAL HIGH (ref 0.0–13.9)

## 2019-07-21 LAB — SJOGRENS SYNDROME-A EXTRACTABLE NUCLEAR ANTIBODY: SSA (Ro) (ENA) Antibody, IgG: <0.2 AI (ref 0.0–0.9)

## 2019-07-21 LAB — ANTI-DNA ANTIBODY, DOUBLE-STRANDED: ds DNA Ab: <1 IU/mL (ref 0–9)

## 2019-07-21 NOTE — Progress Notes (Signed)
Inpatient Diabetes Program Recommendations  AACE/ADA: New Consensus Statement on Inpatient Glycemic Control (2015)  Target Ranges:  Prepandial:   less than 140 mg/dL      Peak postprandial:   less than 180 mg/dL (1-2 hours)      Critically ill patients:  140 - 180 mg/dL   Lab Results  Component Value Date   GLUCAP 215 (H) 07/21/2019   HGBA1C 6.1 (A) 05/05/2019    Review of Glycemic Control Results for Melissa [name], Melissa [name] (MRN [id number]) as of 07/21/2019 12:23  Ref. Range 07/20/2019 12:14 07/20/2019 16:10 07/20/2019 21:38 07/21/2019 07:26 07/21/2019 12:15  Glucose-Capillary Latest Ref Range: 70 - 99 mg/dL 131 (H) 250 (H) 294 (H) 168 (H) 215 (H)   Diabetes history: DM 2 Outpatient Diabetes medications: Levemir 20 units with breakfast, Ozempic 0.25 mg weekly Current orders for Inpatient glycemic control:  Novolog sensitive tid with meals and HS Levemir 12 units q HS Solumedrol 40 mg IV q 12 hours Inpatient Diabetes Program Recommendations:   Please consider adding Novolog 3 units tid with meals (hold if patient eats less than 50%).   Thanks  Adah Perl, RN, BC-ADM Inpatient Diabetes Coordinator Pager [phone] (8a-5p)

## 2019-07-21 NOTE — Plan of Care (Signed)

## 2019-07-21 NOTE — Progress Notes (Signed)
PT Cancellation Note  Patient Details Name: Melissa [name] MRN: [id number] DOB: Nov 06, 1927   Cancelled Treatment:    Reason Eval/Treat Not Completed: Fatigue/lethargy limiting ability to participate.  Pt is unable to wake up right now and will try again in the AM.   Ramond Dial 07/21/2019, 3:03 PM   Mee Hives, PT MS Acute Rehab Dept. Number: [place] and [place]

## 2019-07-21 NOTE — Progress Notes (Signed)
NAME:  Melissa [name], MRN:  [id number], DOB:  04-29-1928, LOS: 77 ADMISSION DATE:  07/10/2019, CONSULTATION DATE:   REFERRING MD: Doristine Bosworth , CHIEF COMPLAINT: New hypoxic respiratory failure  Brief History   85 year old female patient with multiple medical comorbidities most importantly including history of bronchiectasis (felt either NTM or aspiration related), mild obstructive lung disease with FEV1 69% with 5% improvement following bronchodilators and systolic cardiomyopathy with EF 20%.  Admitted 9/26 with new hypoxic respiratory failure and working diagnosis of community-acquired pneumonia versus aspiration pulmonary.  PCCM consulted 9/30 for ongoing supplemental oxygen needs.  10/6 due to continued hypoxia empiric antibiotics ans steroids were resumed, autoimmune and vasculitis profile as well as ERS were sent.   Past Medical History  Recurrent pneumonia Chronic cough Bronchiectasis felt possibly either secondary to MAI or chronic aspiration Dysphasia is identified by modified barium swallow August 2019 Mild obstructive disease with FEV1 69%, improved to 74% with bronchodilators by spirometry September 4193 Chronic systolic heart failure with ejection fraction of 20% Hypertension Atrial fibrillation on Eliquis Diabetes  Significant Hospital Events   9/26 admitted. Started on oxygen azith and rocephin 9/27 feeling a little better but still short of breath with activity still having cough 9/28: Antibiotics broadened, placed on Zosyn instead of Rocephin to cover for possible aspiration 9/29: Still short of breath down to 3 L nasal cannula working with physical therapy, 9/30: Pulmonary consulted as patient still oxygen dependent. 10/5 still on 5-6 liters CXR not much improved. abx stopped.  10/6 cxr worse. No sig fever spike. CBC about the same. CT chest ordered.   Consults:  Pulmonary consult on 9/30  Procedures:    Significant Diagnostic Tests:  CT chest 10/6 >> Severe  multilobar bronchopneumonia, no definite findings to indicate underlying interstitial lung disease. Small bilateral pleural effusions, aortic atherosclerosis. Hepatic steatosis     Micro Data:  COVID 9/26 >> neg Sputum culture 9/27 >> Normal flora Respiratory viral panel 9/30 >> eg Urine strep antigen 9/30 >> neg Urine Legionella antigen 9/30 >> neg Induced sputum for afb 10/6 >> neg  Antimicrobials:  azithromycin 9/26 >> Rocephin 9/26 >>9/27 Zosyn 9/27>>>9/30 augmentin 9/20>>>10/5 Cefepime 10/6 >>  Vancomycin 10/6 >>  Interim history/subjective:  No significant events reported overnight. Patient seen lying in bed in no acute distress. States she feels better today compare to yesterday. External female catheter in place with adequate urine output.   Objective   Blood pressure (!) 115/48, pulse 92, temperature 97.9 F (36.6 C), temperature source Oral, resp. rate 19, height _0  (1.651 m), weight 68.2 kg, SpO2 95 %.        Intake/Output Summary (Last 24 hours) at 07/21/2019 1329 Last data filed at 07/21/2019 1033 Gross per 24 hour  Intake 380.51 ml  Output 300 ml  Net 80.51 ml   Filed Weights   07/11/19 1415  Weight: 68.2 kg    Examination: General: Chronically ill appearing frail elderly /female lying in bed in NAD HEENT: MM pink/moist, PERRL, 4L [place] in place Neuro: Alert and oriented x3, no focal neuro deficits, follow all commands  CV: s1s2 regular rate and rhythm, no murmur, rubs, or gallops,  PULM:  Diminished breath sounds anteriorly, course breath sounds bilaterally posterior, no accessory muscle use    GI: soft, bowel sounds active in all 4 quadrants, non-tender, non-distended Extremities: warm/dry, no edema  Skin: no rashes or lesions  Resolved Hospital Problem list     Assessment & Plan:   Acute on chronic hypoxic  respiratory failure CAP (NOS) bronchiectasis  Dysphagia  Obstructive lung disease w/ BD response  Systolic CM (EF 12%) PAF (on BB and  eliquis)  CKD stage III DM type II HL Hypothyroidism Chronic back pain  Anemia of chronic disease Fluid and electrolyte imbalance  Recent death in family.    Acute on chronic hypoxic respiratory failure in the setting of bilateral patchy pulmonary infiltrates with known history of bronchiectasis and mild obstructive lung disease w/ + BD response .   Autoimmune workup; ESR > 97, ANA > neg, ANCA > **, GBM > neg,  Anti-DNA > **, RF > 21.9, CCP > **, SSA > **, SSB > **,  Anti-SCL > **  -Differential diagnosis includes: aspiration PNA vs BTX flare vs atypical infection (we have been concerned about MAI) also suspect that there is an element of edema  -abx originally stopped 10/5; wbc still trending down; no fever spikes -now on 3-4 liters; had been on 6 on 10/5; although there was concern about accuracy of pulse ox from forehead -HRCT read as Severe multilobar bronchopneumonia, PCT and RVP negative  P: Continue supplemental oxygen and BDs Continue diuresing  Encourage pulmonary hygiene, including frequent flutter and IS use Continue empiric antibiotics Continue IV steroids  Consider FOB  Swallow eval   Systolic CM (EF 82%) w/ h/o PAF; Had recurrent RVR 9/29 -currently NSR w/ last QTc 488 P: Continuous  telemetry  Continue lasix  Continue Amio and BB with slow titration, likely not a good candidate for long-term amio given underlying lung disease  Continue DOAC  Johnsie Cancel, NP-C [facility] Pgr: [phone] or if no answer [phone] 07/21/2019, 2:27 PM

## 2019-07-21 NOTE — Progress Notes (Signed)
PROGRESS NOTE    Melissa [name]  IRC:[id number] DOB: [birth year] DOA: 07/10/2019 PCP: [name], MD   Brief Narrative:  Patient is 85 year old female with history of chronic systolic CHF with ejection fraction of 20%, diabetes type 2, hypertension, A. fib on Eliquis, hyperlipidemia who presented to the emergency department with complaints of cough for 2 weeks.  She is history of recurrent pneumonia and chronic cough.  She follows with Dr. [name], pulmonology.  High-resolution CT scan in September 2019 showed bronchiectatic changes with scaring with concern for MAI versus COPD versus recurrent aspiration.    She was admitted for acute respiratory failure due to bronchopneumonia, possible exacerbation of bronchiectasis. COVID-19 test was negative. Chest x-ray on 07/13/2019 showed bilateral patchy opacities ,PCCM consulted on 07/14/2019.  Hospital course remarkable for development  of A. fib with RVR for which she was started on amiodarone.  Current plan is to do bronchoscopy  by PCCM.  Continue broad-spectrum antibiotics.  Assessment & Plan:   Principal Problem:   Bronchopneumonia Active Problems:   Type 2 diabetes mellitus with diabetic neuropathy, with long-term current use of insulin (HCC)   HLD (hyperlipidemia)   Left ventricular dysfunction   Hypothyroidism, acquired   Essential hypertension   Chronic kidney disease, stage III (moderate) (HCC)   Paroxysmal atrial fibrillation (HCC)   Chronic anticoagulation   Chronic systolic heart failure (HCC)   Dyspnea   Acute respiratory failure with hypoxia (HCC)   Lobar pneumonia (HCC)   Atrial fibrillation with RVR (HCC)   Acute respiratory failure with hypoxia: Suspected to have bronchopneumonia on presentation.  Likely progressive bronchiectasis.  Covid-19  negative.  Chest imaging showing bilateral multiple patchy ill-defined opacities concerning for multifocal pneumonia.  Continues to require 3 L of oxygen per minute.  Not on oxygen at  home.  No history of COPD or asthma.  History of recurrent pneumonia in the past.  Continue bronchodilators.  Anticipating bronchoscopy by PCCM.  Also started on Lasix, steroids.  Continue broad-spectrum antibiotics.Has mild leucocytosis.  Paroxysmal A. fib: Currently in normal sinus rhythm.  Went into A. fib with RVR few days ago.  Cardiology was following.  Started on amiodarone.  On Eliquis.  On metoprolol for rate control  Chronic systolic CHF: Last echocardiogram on 2018 showed ejection fraction of 15 to 20%.  She follows with [name], cardiology.  Currently euvolemic  Diabetes type 2: Recent hemoglobin A1c 6.1.  Continue current insulin regimen  CKD stage III: Baseline creatinine of 1.2-1.5.  Currently at baseline.  Hyperlipidemia: Continue statin  Chronic back pain: Continue supportive care, lidocaine patch  Chronic normocytic anemia: Likely history with chronic disease.  Continue to monitor.  Currently stable  Hypothyroidism: Continue Synthyroid  Deconditioning/debility: She was seen by PT/OT and recommended home health on discharge.           DVT prophylaxis: Eliquis Code Status: Full Family Communication: daughter at the bed side Disposition Plan: Undetermined at this point   Consultants: PCCM  Procedures:None  Antimicrobials:  Anti-infectives (From admission, onward)   Start     Dose/Rate Route Frequency Ordered Stop   07/20/19 1030  ceFEPIme (MAXIPIME) 2 g in sodium chloride 0.9 % 100 mL IVPB     2 g 200 mL/hr over 30 Minutes Intravenous Every 24 hours 07/20/19 1026     07/20/19 1030  vancomycin (VANCOCIN) 1,250 mg in sodium chloride 0.9 % 250 mL IVPB     1,250 mg 166.7 mL/hr over 90 Minutes Intravenous Every 48 hours 07/20/19  1026     07/15/19 2200  amoxicillin-clavulanate (AUGMENTIN) 500-125 MG per tablet 500 mg  Status:  Discontinued     1 tablet Oral 2 times daily 07/15/19 1612 07/19/19 1539   07/14/19 1800  amoxicillin-clavulanate (AUGMENTIN)  875-125 MG per tablet 1 tablet  Status:  Discontinued     1 tablet Oral Every 12 hours 07/14/19 1450 07/15/19 1612   07/11/19 1830  cefTRIAXone (ROCEPHIN) 1 g in sodium chloride 0.9 % 100 mL IVPB  Status:  Discontinued     1 g 200 mL/hr over 30 Minutes Intravenous Every 24 hours 07/10/19 1916 07/11/19 0813   07/11/19 1830  azithromycin (ZITHROMAX) 500 mg in sodium chloride 0.9 % 250 mL IVPB  Status:  Discontinued     500 mg 250 mL/hr over 60 Minutes Intravenous Every 24 hours 07/10/19 1916 07/14/19 1500   07/11/19 1100  piperacillin-tazobactam (ZOSYN) IVPB 3.375 g  Status:  Discontinued     3.375 g 12.5 mL/hr over 240 Minutes Intravenous Every 8 hours 07/11/19 1030 07/14/19 1450   07/11/19 0815  piperacillin-tazobactam (ZOSYN) IVPB 3.375 g  Status:  Discontinued     3.375 g 12.5 mL/hr over 240 Minutes Intravenous Every 8 hours 07/11/19 0813 07/11/19 1030   07/10/19 1800  cefTRIAXone (ROCEPHIN) 1 g in sodium chloride 0.9 % 100 mL IVPB     1 g 200 mL/hr over 30 Minutes Intravenous  Once 07/10/19 1749 07/10/19 1922   07/10/19 1800  azithromycin (ZITHROMAX) tablet 500 mg     500 mg Oral  Once 07/10/19 1749 07/10/19 1834      Subjective:  Patient seen and examined at bedside this afternoon.  Currently hemodynamically stable.  Continues to be on 3 L of oxygen per minute.  Looks extremely weak, deconditioned.  Feels slightly better today.  Objective: Vitals:   07/21/19 0729 07/21/19 0908 07/21/19 1217 07/21/19 1403  BP: (!) 121/57  (!) 115/48   Pulse: 97  92   Resp: 19 18 19    Temp: 97.9 F (36.6 C)  97.9 F (36.6 C)   TempSrc: Oral  Oral   SpO2: 94% 92% 95% 96%  Weight:      Height:        Intake/Output Summary (Last 24 hours) at 07/21/2019 1429 Last data filed at 07/21/2019 1033 Gross per 24 hour  Intake 380.51 ml  Output 300 ml  Net 80.51 ml   Filed Weights   07/11/19 1415  Weight: 68.2 kg    Examination:  General exam: Pleasant elderly female,   deconditioned/debilitated  HEENT:PERRL,Oral mucosa moist, Ear/Nose normal on gross exam Respiratory system: Bilateral decreased air entry, bilateral crackles Cardiovascular system: S1 & S2 heard, RRR. No JVD, murmurs, rubs, gallops or clicks. No pedal edema. Gastrointestinal system: Abdomen is nondistended, soft and nontender. No organomegaly or masses felt. Normal bowel sounds heard. Central nervous system: Alert and oriented. No focal neurological deficits. Extremities: No edema, no clubbing ,no cyanosis, distal peripheral pulses palpable. Skin: No rashes, lesions or ulcers,no icterus ,no pallor   Data Reviewed: I have personally reviewed following labs and imaging studies  CBC: Recent Labs  Lab 07/17/19 0511 07/18/19 0752 07/19/19 0820 07/20/19 0334 07/21/19 0338  WBC 13.6* 16.9* 13.5* 13.2* 12.9*  NEUTROABS 10.3* 13.6* 10.7* 10.5* 12.0*  HGB 11.1* 11.6* 10.7* 10.0* 10.7*  HCT 35.1* 36.7 34.3* 31.5* 33.0*  MCV 90.5 90.2 91.2 89.2 88.9  PLT 365 399 398 417* 856*   Basic Metabolic Panel: Recent Labs  Lab 07/15/19 0444  07/16/19 0456 07/18/19 0937 07/19/19 0820 07/20/19 0934  NA 135 135 132* 135 131*  K 3.6 4.1 4.6 4.1 4.8  CL 93* 92* 87* 88* 83*  CO2 31 32 33* 36* 35*  GLUCOSE 110* 103* 205* 116* 190*  BUN 10 10 12 11 15   CREATININE 1.31* 1.18* 1.11* 1.11* 1.27*  CALCIUM 8.3* 8.1* 8.3* 8.3* 8.5*  MG 1.5*  --   --   --   --    GFR: Estimated Creatinine Clearance: 27 mL/min (A) (by C-G formula based on SCr of 1.27 mg/dL (H)). Liver Function Tests: No results for input(s): AST, ALT, ALKPHOS, BILITOT, PROT, ALBUMIN in the last 168 hours. No results for input(s): LIPASE, AMYLASE in the last 168 hours. No results for input(s): AMMONIA in the last 168 hours. Coagulation Profile: No results for input(s): INR, PROTIME in the last 168 hours. Cardiac Enzymes: No results for input(s): CKTOTAL, CKMB, CKMBINDEX, TROPONINI in the last 168 hours. BNP (last 3 results) No  results for input(s): PROBNP in the last 8760 hours. HbA1C: No results for input(s): HGBA1C in the last 72 hours. CBG: Recent Labs  Lab 07/20/19 1214 07/20/19 1610 07/20/19 2138 07/21/19 0726 07/21/19 1215  GLUCAP 131* 250* 294* 168* 215*   Lipid Profile: No results for input(s): CHOL, HDL, LDLCALC, TRIG, CHOLHDL, LDLDIRECT in the last 72 hours. Thyroid Function Tests: No results for input(s): TSH, T4TOTAL, FREET4, T3FREE, THYROIDAB in the last 72 hours. Anemia Panel: No results for input(s): VITAMINB12, FOLATE, FERRITIN, TIBC, IRON, RETICCTPCT in the last 72 hours. Sepsis Labs: No results for input(s): PROCALCITON, LATICACIDVEN in the last 168 hours.  Recent Results (from the past 240 hour(s))  Respiratory Panel by PCR     Status: None   Collection Time: 07/14/19  7:35 PM   Specimen: Nasopharyngeal Swab; Respiratory  Result Value Ref Range Status   Adenovirus NOT DETECTED NOT DETECTED Final   Coronavirus 229E NOT DETECTED NOT DETECTED Final    Comment: (NOTE) The Coronavirus on the Respiratory Panel, DOES NOT test for the novel  Coronavirus (2019 nCoV)    Coronavirus HKU1 NOT DETECTED NOT DETECTED Final   Coronavirus NL63 NOT DETECTED NOT DETECTED Final   Coronavirus OC43 NOT DETECTED NOT DETECTED Final   Metapneumovirus NOT DETECTED NOT DETECTED Final   Rhinovirus / Enterovirus NOT DETECTED NOT DETECTED Final   Influenza A NOT DETECTED NOT DETECTED Final   Influenza B NOT DETECTED NOT DETECTED Final   Parainfluenza Virus 1 NOT DETECTED NOT DETECTED Final   Parainfluenza Virus 2 NOT DETECTED NOT DETECTED Final   Parainfluenza Virus 3 NOT DETECTED NOT DETECTED Final   Parainfluenza Virus 4 NOT DETECTED NOT DETECTED Final   Respiratory Syncytial Virus NOT DETECTED NOT DETECTED Final   Bordetella pertussis NOT DETECTED NOT DETECTED Final   Chlamydophila pneumoniae NOT DETECTED NOT DETECTED Final   Mycoplasma pneumoniae NOT DETECTED NOT DETECTED Final    Comment:  Performed at [facility], [place]. [address] [place], [place] [address]  Acid Fast Smear (AFB)     Status: None   Collection Time: 07/20/19  4:44 AM   Specimen: Tracheal Aspirate; Sputum  Result Value Ref Range Status   AFB Specimen Processing Concentration  Final   Acid Fast Smear Negative  Final    Comment: (NOTE) Performed At: [facility] [address] [place], [place] [id number] Rush Farmer MD UU:[id number]    Source (AFB) SPUTUM  Final    Comment: Performed at [facility],  [address] [place], [place] [address]         Radiology Studies: Ct Chest High Resolution  Result Date: 07/20/2019 CLINICAL DATA:  85 year old female with history of shortness of breath. Evaluate for interstitial lung disease. EXAM: CT CHEST WITHOUT CONTRAST TECHNIQUE: Multidetector CT imaging of the chest was performed following the standard protocol without intravenous contrast. High resolution imaging of the lungs, as well as inspiratory and expiratory imaging, was performed. COMPARISON:  High-resolution chest CT 07/08/2018. FINDINGS: Cardiovascular: Heart size is normal. There is no significant pericardial fluid, thickening or pericardial calcification. There is aortic atherosclerosis, as well as atherosclerosis of the great vessels of the mediastinum and the coronary arteries, including calcified atherosclerotic plaque in the left main, left anterior descending, left circumflex and right coronary arteries. Mediastinum/Nodes: Multiple prominent borderline enlarged mediastinal and bilateral hilar lymph nodes are noted, nonspecific and likely reactive. Esophagus is unremarkable in appearance. No axillary lymphadenopathy. Lungs/Pleura: Patchy areas of peribronchovascular predominant ground-glass attenuation, consolidation and septal thickening noted throughout all aspects of the lungs bilaterally, most compatible with active bronchopneumonia. In the less affected regions of the lungs there  is no significant septal thickening, subpleural reticulation, traction bronchiectasis or frank honeycombing to clearly indicate underlying interstitial lung disease. Inspiratory and expiratory imaging is unremarkable. Small bilateral pleural effusions lying dependently with some associated passive subsegmental atelectasis in the lower lobes of the lungs bilaterally. Upper Abdomen: Mild diffuse low attenuation throughout the visualized hepatic parenchyma, indicative of hepatic steatosis. Status post cholecystectomy. Musculoskeletal: There are no aggressive appearing lytic or blastic lesions noted in the visualized portions of the skeleton. IMPRESSION: 1. Findings in the lungs are compatible with severe multilobar bronchopneumonia. No definite imaging findings to indicate underlying interstitial lung disease. 2. Small bilateral pleural effusions. 3. Aortic atherosclerosis, in addition to left main and 3 vessel coronary artery disease. 4. Hepatic steatosis. Aortic Atherosclerosis (ICD10-I70.0). Electronically Signed   By: Vinnie Langton M.D.   On: 07/20/2019 10:00   Dg Chest Port 1 View  Result Date: 07/20/2019 CLINICAL DATA:  Cough and shortness-of-breath. EXAM: PORTABLE CHEST 1 VIEW COMPARISON:  07/19/2019 FINDINGS: Lungs are somewhat hypoinflated demonstrate no significant change in patchy bilateral airspace process. No definite effusion. Cardiomediastinal silhouette and remainder of the exam is unchanged. IMPRESSION: Stable patchy bilateral airspace process likely multifocal pneumonia. Electronically Signed   By: Marin Olp M.D.   On: 07/20/2019 10:06        Scheduled Meds:  amiodarone  200 mg Oral BID   Followed by   Derrill Memo ON 07/30/2019] amiodarone  200 mg Oral Daily   apixaban  5 mg Oral BID   atorvastatin  40 mg Oral Daily   cholecalciferol  1,000 Units Oral Daily   FLUoxetine  20 mg Oral Daily   furosemide  40 mg Intravenous Q12H   influenza vaccine adjuvanted  0.5 mL  Intramuscular Tomorrow-1000   insulin aspart  0-5 Units Subcutaneous QHS   insulin aspart  0-9 Units Subcutaneous TID WC   insulin detemir  12 Units Subcutaneous QHS   ipratropium  0.5 mg Nebulization TID   levalbuterol  0.63 mg Nebulization TID   levothyroxine  50 mcg Oral Daily   lidocaine  1 patch Transdermal Q24H   methylPREDNISolone (SOLU-MEDROL) injection  40 mg Intravenous Q12H   metoprolol succinate  12.5 mg Oral Daily   multivitamin  1 tablet Oral BID   multivitamins with iron  1 tablet Oral Daily   omega-3 acid ethyl esters  1,000 mg Oral  Daily   pantoprazole  40 mg Oral Daily   pramipexole  0.75 mg Oral QHS   pregabalin  50 mg Oral TID   traZODone  100 mg Oral QHS   umeclidinium bromide  1 puff Inhalation Daily   Continuous Infusions:  ceFEPime (MAXIPIME) IV 200 mL/hr at 07/21/19 1033   vancomycin 1,250 mg (07/20/19 1242)     LOS: 11 days    Time spent:35 mins. More than 50% of that time was spent in counseling and/or coordination of care.      Shelly Coss, MD [facility] Pager [phone]  If 7PM-7AM, please contact night-coverage [email or web address] Password [email or web address] 07/21/2019, 2:29 PM

## 2019-07-22 LAB — GLUCOSE, CAPILLARY
Glucose-Capillary: 198 mg/dL — ABNORMAL HIGH (ref 70–99)
Glucose-Capillary: 218 mg/dL — ABNORMAL HIGH (ref 70–99)
Glucose-Capillary: 288 mg/dL — ABNORMAL HIGH (ref 70–99)
Glucose-Capillary: 244 mg/dL — ABNORMAL HIGH (ref 70–99)

## 2019-07-22 LAB — CBC WITH DIFFERENTIAL/PLATELET
Abs Immature Granulocytes: 0.21 10*3/uL — ABNORMAL HIGH (ref 0.00–0.07)
Basophils Absolute: 0 10*3/uL (ref 0.0–0.1)
Basophils Relative: 0 %
Eosinophils Absolute: 0 10*3/uL (ref 0.0–0.5)
Eosinophils Relative: 0 %
HCT: 30.9 % — ABNORMAL LOW (ref 36.0–46.0)
Hemoglobin: 10 g/dL — ABNORMAL LOW (ref 12.0–15.0)
Immature Granulocytes: 1 %
Lymphocytes Relative: 6 %
Lymphs Abs: 0.9 10*3/uL (ref 0.7–4.0)
MCH: 28.7 pg (ref 26.0–34.0)
MCHC: 32.4 g/dL (ref 30.0–36.0)
MCV: 88.5 fL (ref 80.0–100.0)
Monocytes Absolute: 0.2 10*3/uL (ref 0.1–1.0)
Monocytes Relative: 1 %
Neutro Abs: 15.1 10*3/uL — ABNORMAL HIGH (ref 1.7–7.7)
Neutrophils Relative %: 92 %
Platelets: 474 10*3/uL — ABNORMAL HIGH (ref 150–400)
RBC: 3.49 MIL/uL — ABNORMAL LOW (ref 3.87–5.11)
RDW: 13.2 % (ref 11.5–15.5)
WBC: 16.5 10*3/uL — ABNORMAL HIGH (ref 4.0–10.5)
nRBC: 0 % (ref 0.0–0.2)

## 2019-07-22 LAB — BASIC METABOLIC PANEL
Anion gap: 8 (ref 5–15)
BUN: 34 mg/dL — ABNORMAL HIGH (ref 8–23)
CO2: 37 mmol/L — ABNORMAL HIGH (ref 22–32)
Calcium: 8.1 mg/dL — ABNORMAL LOW (ref 8.9–10.3)
Chloride: 87 mmol/L — ABNORMAL LOW (ref 98–111)
Creatinine, Ser: 1.53 mg/dL — ABNORMAL HIGH (ref 0.44–1.00)
GFR calc Af Amer: 35 mL/min — ABNORMAL LOW (ref >60)
GFR calc non Af Amer: 30 mL/min — ABNORMAL LOW (ref >60)
Glucose, Bld: 190 mg/dL — ABNORMAL HIGH (ref 70–99)
Potassium: 5.3 mmol/L — ABNORMAL HIGH (ref 3.5–5.1)
Sodium: 132 mmol/L — ABNORMAL LOW (ref 135–145)

## 2019-07-22 LAB — MPO/PR-3 (ANCA) ANTIBODIES
ANCA Proteinase 3: <3.5 U/mL (ref 0.0–3.5)
Myeloperoxidase Abs: <9 U/mL (ref 0.0–9.0)

## 2019-07-22 LAB — MRSA PCR SCREENING: MRSA by PCR: NEGATIVE

## 2019-07-22 LAB — CYCLIC CITRUL PEPTIDE ANTIBODY, IGG/IGA: CCP Antibodies IgG/IgA: 3 units (ref 0–19)

## 2019-07-22 MED ORDER — VANCOMYCIN HCL IN DEXTROSE 1-5 GM/200ML-% IV SOLN: 1000.0000 mg | INTRAVENOUS | Status: DC

## 2019-07-22 MED ORDER — FUROSEMIDE 40 MG PO TABS: 40.0000 mg | ORAL_TABLET | Freq: Every day | ORAL | Status: DC

## 2019-07-22 MED ORDER — INSULIN ASPART 100 UNIT/ML ~~LOC~~ SOLN
3.0000 [IU] | Freq: Three times a day (TID) | SUBCUTANEOUS | Status: DC
  Administered 2019-07-22 – 2019-07-27 (×14): 3 [IU] via SUBCUTANEOUS

## 2019-07-22 MED ORDER — GUAIFENESIN-DM 100-10 MG/5ML PO SYRP
10.0000 mL | ORAL_SOLUTION | Freq: Four times a day (QID) | ORAL | Status: DC
  Administered 2019-07-22 – 2019-07-27 (×17): 10 mL via ORAL
  Filled 2019-07-22 (×17): qty 10

## 2019-07-22 NOTE — Progress Notes (Signed)
NAME:  Melissa [name], MRN:  [id number], DOB:  [birth year], LOS: 69 ADMISSION DATE:  07/10/2019, CONSULTATION DATE:   REFERRING MD: Doristine Bosworth , CHIEF COMPLAINT: New hypoxic respiratory failure  Brief History   85 year old female patient with multiple medical comorbidities most importantly including history of bronchiectasis (felt either NTM or aspiration related), mild obstructive lung disease with FEV1 69% with 5% improvement following bronchodilators and systolic cardiomyopathy with EF 20%.  Admitted 9/26 with new hypoxic respiratory failure and working diagnosis of community-acquired pneumonia versus aspiration pulmonary.  PCCM consulted 9/30 for ongoing supplemental oxygen needs.  10/6 due to continued hypoxia empiric antibiotics ans steroids were resumed, autoimmune and vasculitis profile as well as ERS were sent.   Past Medical History  Recurrent pneumonia Chronic cough Bronchiectasis felt possibly either secondary to MAI or chronic aspiration Dysphasia is identified by modified barium swallow August 2019 Mild obstructive disease with FEV1 69%, improved to 74% with bronchodilators by spirometry September 3244 Chronic systolic heart failure with ejection fraction of 20% Hypertension Atrial fibrillation on Eliquis Diabetes  Significant Hospital Events   9/26 admitted. Started on oxygen azith and rocephin 9/27 feeling a little better but still short of breath with activity still having cough 9/28: Antibiotics broadened, placed on Zosyn instead of Rocephin to cover for possible aspiration 9/29: Still short of breath down to 3 L nasal cannula working with physical therapy, 9/30: Pulmonary consulted as patient still oxygen dependent. 10/5 still on 5-6 liters CXR not much improved. abx stopped.  10/6 cxr worse. No sig fever spike. CBC about the same.   Consults:  Pulmonary consult on 9/30  Procedures:    Significant Diagnostic Tests:  CT chest 10/6 >> Severe multilobar  bronchopneumonia, no definite findings to indicate underlying interstitial lung disease. Small bilateral pleural effusions, aortic atherosclerosis. Hepatic steatosis     Micro Data:  COVID 9/26 >> neg Sputum culture 9/27 >> Normal flora Respiratory viral panel 9/30 >> eg Urine strep antigen 9/30 >> neg Urine Legionella antigen 9/30 >> neg Induced sputum for afb 10/6 >> neg  Antimicrobials:  azithromycin 9/26 >> Rocephin 9/26 >>9/27 Zosyn 9/27>>>9/30 augmentin 9/20>>>10/5 Cefepime 10/6 >>  Vancomycin 10/6 >>  Interim history/subjective:  No significant events reported overnight. She is sitting up in bedside recliner, reports improvement in dyspnea and cough compared to yesterday  Objective   Blood pressure 111/84, pulse 93, temperature 97.9 F (36.6 C), resp. rate 18, height '5\' 5"'$  (1.651 m), weight 68.2 kg, SpO2 96 %.        Intake/Output Summary (Last 24 hours) at 07/22/2019 0856 Last data filed at 07/22/2019 0102 Gross per 24 hour  Intake 100.36 ml  Output 1550 ml  Net -1449.64 ml   Filed Weights   07/11/19 1415  Weight: 68.2 kg    Examination: General: Chronically ill appearing elderly female lying in bed, in NAD HEENT: MM pink/moist, PERRL, 3lnc in place  Neuro: Alert and oriented x3, no focal neuro deficits, follows all commands CV: s1s2 regular rate and rhythm, no murmur, rubs, or gallops,  PULM:  Good air entry bilaterally with course sounds posteriorly  GI: soft, bowel sounds active in all 4 quadrants, non-tender, non-distended Extremities: warm/dry, no edema  Skin: no rashes or lesions  Resolved Hospital Problem list     Assessment & Plan:   Acute on chronic hypoxic respiratory failure CAP (NOS) bronchiectasis  Dysphagia  Obstructive lung disease w/ BD response  Systolic CM (EF 72%) PAF (on BB and eliquis)  CKD stage  III DM type II HL Hypothyroidism Chronic back pain  Anemia of chronic disease Fluid and electrolyte imbalance  Recent death in  family.    Acute on chronic hypoxic respiratory failure in the setting of bilateral patchy pulmonary infiltrates with known history of bronchiectasis and mild obstructive lung disease w/ + BD response .   Autoimmune workup; ESR > 97, ANA > neg, ANCA > **, GBM > neg,  Anti-DNA > neg, RF > 21.9, CCP > neg, SSA > neg, SSB > neg,  Anti-SCL > neg  -Differential diagnosis includes: aspiration PNA vs BTX flare vs atypical infection (we have been concerned about MAI) also suspect that there is an element of edema  -abx originally stopped 10/5; wbc still trending down; no fever spikes -now on 3-4 liters; had been on 6 on 10/5; although there was concern about accuracy of pulse ox from forehead -HRCT read as severe multilobar bronchopneumonia, PCT and RVP negative  P: Continue supplemental oxygen and BD Wean O2 for sats >92% Continue diuresing  Encourage frequent pulmonary hygiene habits including flutter valve and IS use  Continue empiric antibiotics  IV steroids  May need FOB during hospitalization vs outpatient   Systolic CM (EF 64%) w/ h/o PAF; Had recurrent RVR 9/29 -currently NSR w/ last QTc 488 P: Continuous telemetry  Further diuretics per primary  Continue Amiodarone with slow taper and beta blocker Not a good candidate for long term Amiodarone use given underlying lung disease  Continue DOAC  Johnsie Cancel, NP-C [facility] Pgr: [phone] or if no answer [phone] 07/22/2019, 8:56 AM

## 2019-07-22 NOTE — Progress Notes (Signed)
Iv flushed and was able to identify blood return, at this time patient no longer complaining of discomfort, RN made aware to notify IV team if discomfort returns
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
## 2019-07-22 NOTE — Progress Notes (Signed)
PROGRESS NOTE    Melissa [name]  XKG:[id number] DOB: [birth year] DOA: 07/10/2019 PCP: [name], MD   Brief Narrative:  Patient is 85 year old female with history of chronic systolic CHF with ejection fraction of 20%, diabetes type 2, hypertension, A. fib on Eliquis, hyperlipidemia who presented to the emergency department with complaints of cough for 2 weeks.  She is history of recurrent pneumonia and chronic cough.  She follows with Dr. [name], pulmonology.  High-resolution CT scan in September 2019 showed bronchiectatic changes with scaring with concern for MAI versus COPD versus recurrent aspiration.    She was admitted for acute respiratory failure due to bronchopneumonia, possible exacerbation of bronchiectasis. COVID-19 test was negative. Chest x-ray on 07/13/2019 showed bilateral patchy opacities ,PCCM consulted on 07/14/2019.  Hospital course remarkable for development  of A. fib with RVR for which she was started on amiodarone. Her respiratory status has not significantly improved.  She is unstable for bronchoscopy so the plan is to continue antibiotics and monitor for improvement.  Assessment & Plan:   Principal Problem:   Bronchopneumonia Active Problems:   Type 2 diabetes mellitus with diabetic neuropathy, with long-term current use of insulin (HCC)   HLD (hyperlipidemia)   Left ventricular dysfunction   Hypothyroidism, acquired   Essential hypertension   Chronic kidney disease, stage III (moderate) (HCC)   Paroxysmal atrial fibrillation (HCC)   Chronic anticoagulation   Chronic systolic heart failure (HCC)   Dyspnea   Acute respiratory failure with hypoxia (HCC)   Lobar pneumonia (HCC)   Atrial fibrillation with RVR (HCC)   Acute respiratory failure with hypoxia: Suspected to have bronchopneumonia on presentation.  Likely progressive bronchiectasis.  Covid-19  negative.  Chest imaging showing bilateral multiple patchy ill-defined opacities concerning for multifocal  pneumonia.  Continues to require 3 L of oxygen per minute.  Not on oxygen at home.  No history of COPD or asthma.  History of recurrent pneumonia in the past.  Continue bronchodilators.  Planned  bronchoscopy by PCCM but canceled because her respiratory status is not  stable.  Also started on  steroids.  Continue broad-spectrum antibiotics. Lasix held today due to worsening kidney function.  Also has worsening of leukocytosis.  Paroxysmal A. fib: Currently in normal sinus rhythm.  Went into A. fib with RVR few days ago.  Cardiology was following.  Started on amiodarone.  On Eliquis.  On metoprolol for rate control  Chronic systolic CHF: Last echocardiogram on 2018 showed ejection fraction of 15 to 20%.  She follows with [name], cardiology.  Currently euvolemic  Diabetes type 2: Recent hemoglobin A1c 6.1.  Continue current insulin regimen  CKD stage III: Baseline creatinine of 1.2-1.5.  Creatine creeping up  Hyperlipidemia: Continue statin  Chronic back pain: Continue supportive care, lidocaine patch  Chronic normocytic anemia: Likely history with chronic disease.  Continue to monitor.  Currently stable  Hypothyroidism: Continue Synthyroid  Deconditioning/debility: She was seen by PT/OT and recommended home health on discharge.  Goals of care: Very elderly female with multiple comorbidities.  Respiratory status has not significantly improved.  If her overall status does not improve in the next few days, we may need to discuss about goals of care.  Which will most likely involve palliative care.         DVT prophylaxis: Eliquis Code Status: Full Family Communication: daughter at the bed side on 07/21/19 Disposition Plan: Undetermined at this point   Consultants: PCCM  Procedures:None  Antimicrobials:  Anti-infectives (From admission, onward)  Start     Dose/Rate Route Frequency Ordered Stop   07/24/19 1230  vancomycin (VANCOCIN) IVPB 1000 mg/200 mL premix     1,000 mg 200  mL/hr over 60 Minutes Intravenous Every 48 hours 07/22/19 1049     07/20/19 1030  ceFEPIme (MAXIPIME) 2 g in sodium chloride 0.9 % 100 mL IVPB     2 g 200 mL/hr over 30 Minutes Intravenous Every 24 hours 07/20/19 1026     07/20/19 1030  vancomycin (VANCOCIN) 1,250 mg in sodium chloride 0.9 % 250 mL IVPB  Status:  Discontinued     1,250 mg 166.7 mL/hr over 90 Minutes Intravenous Every 48 hours 07/20/19 1026 07/22/19 1049   07/15/19 2200  amoxicillin-clavulanate (AUGMENTIN) 500-125 MG per tablet 500 mg  Status:  Discontinued     1 tablet Oral 2 times daily 07/15/19 1612 07/19/19 1539   07/14/19 1800  amoxicillin-clavulanate (AUGMENTIN) 875-125 MG per tablet 1 tablet  Status:  Discontinued     1 tablet Oral Every 12 hours 07/14/19 1450 07/15/19 1612   07/11/19 1830  cefTRIAXone (ROCEPHIN) 1 g in sodium chloride 0.9 % 100 mL IVPB  Status:  Discontinued     1 g 200 mL/hr over 30 Minutes Intravenous Every 24 hours 07/10/19 1916 07/11/19 0813   07/11/19 1830  azithromycin (ZITHROMAX) 500 mg in sodium chloride 0.9 % 250 mL IVPB  Status:  Discontinued     500 mg 250 mL/hr over 60 Minutes Intravenous Every 24 hours 07/10/19 1916 07/14/19 1500   07/11/19 1100  piperacillin-tazobactam (ZOSYN) IVPB 3.375 g  Status:  Discontinued     3.375 g 12.5 mL/hr over 240 Minutes Intravenous Every 8 hours 07/11/19 1030 07/14/19 1450   07/11/19 0815  piperacillin-tazobactam (ZOSYN) IVPB 3.375 g  Status:  Discontinued     3.375 g 12.5 mL/hr over 240 Minutes Intravenous Every 8 hours 07/11/19 0813 07/11/19 1030   07/10/19 1800  cefTRIAXone (ROCEPHIN) 1 g in sodium chloride 0.9 % 100 mL IVPB     1 g 200 mL/hr over 30 Minutes Intravenous  Once 07/10/19 1749 07/10/19 1922   07/10/19 1800  azithromycin (ZITHROMAX) tablet 500 mg     500 mg Oral  Once 07/10/19 1749 07/10/19 1834      Subjective:  Patient seen and examined the bedside this morning.  He still on 5 L of oxygen today.  Sitting in the chair.  Coughing.   Respiratory status  has not significantly improved from yesterday.  Objective: Vitals:   07/22/19 0700 07/22/19 0744 07/22/19 0812 07/22/19 1100  BP: (!) 96/41 (!) 106/46 111/84 (!) 111/42  Pulse:   93   Resp: 17 18  20   Temp: 97.9 F (36.6 C) 97.9 F (36.6 C)  97.8 F (36.6 C)  TempSrc: Oral     SpO2: 97% 96%  96%  Weight:      Height:        Intake/Output Summary (Last 24 hours) at 07/22/2019 1406 Last data filed at 07/22/2019 1100 Gross per 24 hour  Intake 100.36 ml  Output 2150 ml  Net -2049.64 ml   Filed Weights   07/11/19 1415  Weight: 68.2 kg    Examination:  General exam: Pleasant elderly female,  Very deconditioned/debilitated  HEENT:PERRL,Oral mucosa moist, Ear/Nose normal on gross exam Respiratory system: Bilateral decreased air entry, bilateral crackles Cardiovascular system: S1 & S2 heard, RRR. No JVD, murmurs, rubs, gallops or clicks. No pedal edema. Gastrointestinal system: Abdomen is nondistended, soft and nontender. No  organomegaly or masses felt. Normal bowel sounds heard. Central nervous system: Alert and oriented. No focal neurological deficits. Extremities: No edema, no clubbing ,no cyanosis, distal peripheral pulses palpable. Skin: No rashes, lesions or ulcers,no icterus ,no pallor   Data Reviewed: I have personally reviewed following labs and imaging studies  CBC: Recent Labs  Lab 07/18/19 0752 07/19/19 0820 07/20/19 0334 07/21/19 0338 07/22/19 0342  WBC 16.9* 13.5* 13.2* 12.9* 16.5*  NEUTROABS 13.6* 10.7* 10.5* 12.0* 15.1*  HGB 11.6* 10.7* 10.0* 10.7* 10.0*  HCT 36.7 34.3* 31.5* 33.0* 30.9*  MCV 90.2 91.2 89.2 88.9 88.5  PLT 399 398 417* 462* 409*   Basic Metabolic Panel: Recent Labs  Lab 07/16/19 0456 07/18/19 0937 07/19/19 0820 07/20/19 0934 07/22/19 0342  NA 135 132* 135 131* 132*  K 4.1 4.6 4.1 4.8 5.3*  CL 92* 87* 88* 83* 87*  CO2 32 33* 36* 35* 37*  GLUCOSE 103* 205* 116* 190* 190*  BUN 10 12 11 15  34*  CREATININE  1.18* 1.11* 1.11* 1.27* 1.53*  CALCIUM 8.1* 8.3* 8.3* 8.5* 8.1*   GFR: Estimated Creatinine Clearance: 22.4 mL/min (A) (by C-G formula based on SCr of 1.53 mg/dL (H)). Liver Function Tests: No results for input(s): AST, ALT, ALKPHOS, BILITOT, PROT, ALBUMIN in the last 168 hours. No results for input(s): LIPASE, AMYLASE in the last 168 hours. No results for input(s): AMMONIA in the last 168 hours. Coagulation Profile: No results for input(s): INR, PROTIME in the last 168 hours. Cardiac Enzymes: No results for input(s): CKTOTAL, CKMB, CKMBINDEX, TROPONINI in the last 168 hours. BNP (last 3 results) No results for input(s): PROBNP in the last 8760 hours. HbA1C: No results for input(s): HGBA1C in the last 72 hours. CBG: Recent Labs  Lab 07/21/19 1215 07/21/19 1620 07/21/19 2114 07/22/19 0742 07/22/19 1133  GLUCAP 215* 269* 268* 198* 218*   Lipid Profile: No results for input(s): CHOL, HDL, LDLCALC, TRIG, CHOLHDL, LDLDIRECT in the last 72 hours. Thyroid Function Tests: No results for input(s): TSH, T4TOTAL, FREET4, T3FREE, THYROIDAB in the last 72 hours. Anemia Panel: No results for input(s): VITAMINB12, FOLATE, FERRITIN, TIBC, IRON, RETICCTPCT in the last 72 hours. Sepsis Labs: No results for input(s): PROCALCITON, LATICACIDVEN in the last 168 hours.  Recent Results (from the past 240 hour(s))  Respiratory Panel by PCR     Status: None   Collection Time: 07/14/19  7:35 PM   Specimen: Nasopharyngeal Swab; Respiratory  Result Value Ref Range Status   Adenovirus NOT DETECTED NOT DETECTED Final   Coronavirus 229E NOT DETECTED NOT DETECTED Final    Comment: (NOTE) The Coronavirus on the Respiratory Panel, DOES NOT test for the novel  Coronavirus (2019 nCoV)    Coronavirus HKU1 NOT DETECTED NOT DETECTED Final   Coronavirus NL63 NOT DETECTED NOT DETECTED Final   Coronavirus OC43 NOT DETECTED NOT DETECTED Final   Metapneumovirus NOT DETECTED NOT DETECTED Final   Rhinovirus /  Enterovirus NOT DETECTED NOT DETECTED Final   Influenza A NOT DETECTED NOT DETECTED Final   Influenza B NOT DETECTED NOT DETECTED Final   Parainfluenza Virus 1 NOT DETECTED NOT DETECTED Final   Parainfluenza Virus 2 NOT DETECTED NOT DETECTED Final   Parainfluenza Virus 3 NOT DETECTED NOT DETECTED Final   Parainfluenza Virus 4 NOT DETECTED NOT DETECTED Final   Respiratory Syncytial Virus NOT DETECTED NOT DETECTED Final   Bordetella pertussis NOT DETECTED NOT DETECTED Final   Chlamydophila pneumoniae NOT DETECTED NOT DETECTED Final   Mycoplasma pneumoniae NOT DETECTED  NOT DETECTED Final    Comment: Performed at [facility], [place] [address] [place] [address], [place], [place] [address]  Acid Fast Smear (AFB)     Status: None   Collection Time: 07/20/19  4:44 AM   Specimen: Tracheal Aspirate; Sputum  Result Value Ref Range Status   AFB Specimen Processing Concentration  Final   Acid Fast Smear Negative  Final    Comment: (NOTE) Performed At: [facility] [address] [place], [place] [id number] Rush Farmer MD ID:[id number]    Source (AFB) SPUTUM  Final    Comment: Performed at [facility], [place] [address] [place], [place] [address]         Radiology Studies: No results found.      Scheduled Meds: . amiodarone  200 mg Oral BID   Followed by  . [START ON 07/30/2019] amiodarone  200 mg Oral Daily  . apixaban  5 mg Oral BID  . atorvastatin  40 mg Oral Daily  . cholecalciferol  1,000 Units Oral Daily  . FLUoxetine  20 mg Oral Daily  . guaiFENesin-dextromethorphan  10 mL Oral Q6H  . influenza vaccine adjuvanted  0.5 mL Intramuscular Tomorrow-1000  . insulin aspart  0-5 Units Subcutaneous QHS  . insulin aspart  0-9 Units Subcutaneous TID WC  . insulin detemir  12 Units Subcutaneous QHS  . ipratropium  0.5 mg Nebulization TID  . levalbuterol  0.63 mg Nebulization TID  . levothyroxine  50 mcg Oral Daily  . lidocaine  1 patch Transdermal Q24H  . methylPREDNISolone  (SOLU-MEDROL) injection  40 mg Intravenous Q12H  . metoprolol succinate  12.5 mg Oral Daily  . multivitamin  1 tablet Oral BID  . multivitamins with iron  1 tablet Oral Daily  . omega-3 acid ethyl esters  1,000 mg Oral Daily  . pantoprazole  40 mg Oral Daily  . pramipexole  0.75 mg Oral QHS  . pregabalin  50 mg Oral TID  . traZODone  100 mg Oral QHS  . umeclidinium bromide  1 puff Inhalation Daily   Continuous Infusions: . ceFEPime (MAXIPIME) IV 2 g (07/22/19 0918)  . [START ON 07/24/2019] vancomycin       LOS: 12 days    Time spent:35 mins. More than 50% of that time was spent in counseling and/or coordination of care.      Shelly Coss, MD [facility] Pager [phone]  If 7PM-7AM, please contact night-coverage [email or web address] Password [email or web address] 07/22/2019, 2:06 PM

## 2019-07-22 NOTE — Progress Notes (Signed)
Inpatient Diabetes Program Recommendations  AACE/ADA: New Consensus Statement on Inpatient Glycemic Control   Target Ranges:  Prepandial:   less than 140 mg/dL      Peak postprandial:   less than 180 mg/dL (1-2 hours)      Critically ill patients:  140 - 180 mg/dL   Results for Melissa [name], Melissa [name] (MRN [id number]) as of 07/22/2019 12:09  Ref. Range 07/21/2019 07:26 07/21/2019 12:15 07/21/2019 16:20 07/21/2019 21:14 07/22/2019 07:42 07/22/2019 11:33  Glucose-Capillary Latest Ref Range: 70 - 99 mg/dL 168 (H) 215 (H) 269 (H) 268 (H) 198 (H) 218 (H)   Review of Glycemic Control  Diabetes history: DM2 Outpatient Diabetes medications: Levemir 20 units QAM with breakfast, Ozempic 0.25 mg weekly Current orders for Inpatient glycemic control: Levemir 12 units QHS, Novolog 0-9 units TID with meals, Novolog 0-5 units QHS; Solumedrol 40 mg Q12H  Inpatient Diabetes Program Recommendations:   Insulin-Meal Coverage: If steroids are continued, please consider ordering Novolog 3  units TID with meals for meal coverage if patient eats at least 50% of meals.  Thanks, Barnie Alderman, RN, MSN, CDE Diabetes Coordinator Inpatient Diabetes Program [phone] (Team Pager from 8am to 5pm)

## 2019-07-22 NOTE — Plan of Care (Signed)

## 2019-07-22 NOTE — Progress Notes (Signed)
Pharmacy Antibiotic Note  Melissa [name] is a 85 y.o. female admitted on 07/10/2019 with recurrent PNA, recently completed antibiotic course 10/5 with Augmentin.  Pharmacy has been consulted for vancomycin/cefepime dosing - day #3 restart. SCr 1.27>1.53, trend up today.  Plan: Cefepime 2g IV q24h Reduce vancomycin 1000mg  IV Q 48 hrs. Goal AUC 400-550. Expected AUC: 442 SCr used: 1.53 Monitor clinical progress, c/s, renal function F/u de-escalation plan/LOT, vancomycin levels as indicated   Height: 5\' 5"  (165.1 cm) Weight: 150 lb 6.4 oz (68.2 kg) IBW/kg (Calculated) : 57  Temp (24hrs), Avg:98.1 F (36.7 C), Min:97.9 F (36.6 C), Max:98.4 F (36.9 C)  Recent Labs  Lab 07/16/19 0456  07/18/19 0752 07/18/19 0937 07/19/19 0820 07/20/19 0334 07/20/19 0934 07/21/19 0338 07/22/19 0342  WBC 12.7*   < > 16.9*  --  13.5* 13.2*  --  12.9* 16.5*  CREATININE 1.18*  --   --  1.11* 1.11*  --  1.27*  --  1.53*   < > = values in this interval not displayed.    Estimated Creatinine Clearance: 22.4 mL/min (A) (by C-G formula based on SCr of 1.53 mg/dL (H)).    No Known Allergies  Antimicrobials this admission: 10/6 vancomycin >>  10/6 cefepime >>  azithromycin 9/26>>>9/29 Rocephin 9/26 >> 9/27 Zosyn 9/27>>>9/30 augmentin 9/20>>>10/5  Dose adjustments this admission:   Microbiology results: 9/26 covid - neg 9/27 sputum - nf 9/30 resp panel - neg 10/6 AFB - neg 10/8 mrsa pcr -   Elicia Lamp, PharmD, BCPS Clinical Pharmacist 07/22/2019 10:50 AM

## 2019-07-22 NOTE — Progress Notes (Signed)
Physical Therapy Treatment Patient Details Name: Melissa [name] MRN: [id number] DOB: 30-Sep-1928 Today's Date: 07/22/2019    History of Present Illness Pt adm with acute hypoxic respiratory failure due to bronchopneumonia. PMH - htn, dm, afib, chf, ckd, asthma, arthritis,     PT Comments    Pt received in bed, frail and ill-appearing. Primary c/o is weakness. She required min assist bed mobility, min assist sit to stand and min assist ambulation 3' with RW. Mobility signigicantly limited by weakness, fatigue and respiratory status. Pt on 5L O2 with sats >90% during mobility. 93% at end of session in recliner. Due to decline in activity tolerance and decreased mobility progress, discharge recommendations updated to SNF.     Follow Up Recommendations  SNF;Supervision/Assistance - 24 hour     Equipment Recommendations  None recommended by PT    Recommendations for Other Services       Precautions / Restrictions Precautions Precautions: Fall;Other (comment) Precaution Comments: watch SpO2 Restrictions Weight Bearing Restrictions: No    Mobility  Bed Mobility Overal bed mobility: Needs Assistance Bed Mobility: Supine to Sit     Supine to sit: Min assist     General bed mobility comments: +rail, increased time  Transfers Overall transfer level: Needs assistance Equipment used: Rolling walker (2 wheeled) Transfers: Sit to/from Stand Sit to Stand: Mod assist         General transfer comment: assist to power up  Ambulation/Gait Ambulation/Gait assistance: Min assist Gait Distance (Feet): 3 Feet Assistive device: Rolling walker (2 wheeled) Gait Pattern/deviations: Decreased stride length Gait velocity: decreased   General Gait Details: amb bed to recliner. Distance limited by fatigue/weakness.   Stairs             Wheelchair Mobility    Modified Rankin (Stroke Patients Only)       Balance Overall balance assessment: Needs assistance Sitting-balance  support: Feet supported;No upper extremity supported Sitting balance-Leahy Scale: Fair     Standing balance support: Bilateral upper extremity supported;During functional activity Standing balance-Leahy Scale: Poor Standing balance comment: reliant on UE support                            Cognition Arousal/Alertness: Awake/alert Behavior During Therapy: WFL for tasks assessed/performed Overall Cognitive Status: Within Functional Limits for tasks assessed                                        Exercises      General Comments General comments (skin integrity, edema, etc.): Pt on 5L O2 throughout session. SpO2 90-94%. 93% in recliner at end of session.      Pertinent Vitals/Pain Pain Assessment: Faces Faces Pain Scale: Hurts little more Pain Location: chest Pain Descriptors / Indicators: Sore;Heaviness Pain Intervention(s): Limited activity within patient's tolerance;Repositioned    Home Living                      Prior Function            PT Goals (current goals can now be found in the care plan section) Acute Rehab PT Goals Patient Stated Goal: feel better PT Goal Formulation: With patient Time For Goal Achievement: 07/27/19 Potential to Achieve Goals: Fair Progress towards PT goals: Not progressing toward goals - comment(decline in med status, decreased activity tolerance)    Frequency  Min 3X/week      PT Plan Discharge plan needs to be updated    Co-evaluation              AM-PAC PT "6 Clicks" Mobility   Outcome Measure  Help needed turning from your back to your side while in a flat bed without using bedrails?: A Little Help needed moving from lying on your back to sitting on the side of a flat bed without using bedrails?: A Little Help needed moving to and from a bed to a chair (including a wheelchair)?: A Little Help needed standing up from a chair using your arms (e.g., wheelchair or bedside chair)?: A  Little Help needed to walk in hospital room?: A Lot Help needed climbing 3-5 steps with a railing? : Total 6 Click Score: 15    End of Session Equipment Utilized During Treatment: Oxygen;Gait belt Activity Tolerance: Patient limited by fatigue Patient left: in chair;with chair alarm set;with call bell/phone within reach Nurse Communication: Mobility status PT Visit Diagnosis: Unsteadiness on feet (R26.81);Muscle weakness (generalized) (M62.81)     Time: 8916-9450 PT Time Calculation (min) (ACUTE ONLY): 18 min  Charges:  $Therapeutic Activity: 8-22 mins                     Lorrin Goodell, PT  Office # [phone] Pager [phone]    Lorriane Shire 07/22/2019, 10:45 AM

## 2019-07-23 LAB — BASIC METABOLIC PANEL
Anion gap: 10 (ref 5–15)
BUN: 35 mg/dL — ABNORMAL HIGH (ref 8–23)
CO2: 37 mmol/L — ABNORMAL HIGH (ref 22–32)
Calcium: 8.4 mg/dL — ABNORMAL LOW (ref 8.9–10.3)
Chloride: 85 mmol/L — ABNORMAL LOW (ref 98–111)
Creatinine, Ser: 1.3 mg/dL — ABNORMAL HIGH (ref 0.44–1.00)
GFR calc Af Amer: 42 mL/min — ABNORMAL LOW (ref >60)
GFR calc non Af Amer: 36 mL/min — ABNORMAL LOW (ref >60)
Glucose, Bld: 249 mg/dL — ABNORMAL HIGH (ref 70–99)
Potassium: 5.4 mmol/L — ABNORMAL HIGH (ref 3.5–5.1)
Sodium: 132 mmol/L — ABNORMAL LOW (ref 135–145)

## 2019-07-23 LAB — CBC WITH DIFFERENTIAL/PLATELET
Abs Immature Granulocytes: 0.16 10*3/uL — ABNORMAL HIGH (ref 0.00–0.07)
Basophils Absolute: 0 10*3/uL (ref 0.0–0.1)
Basophils Relative: 0 %
Eosinophils Absolute: 0 10*3/uL (ref 0.0–0.5)
Eosinophils Relative: 0 %
HCT: 34.5 % — ABNORMAL LOW (ref 36.0–46.0)
Hemoglobin: 10.8 g/dL — ABNORMAL LOW (ref 12.0–15.0)
Immature Granulocytes: 1 %
Lymphocytes Relative: 6 %
Lymphs Abs: 0.8 10*3/uL (ref 0.7–4.0)
MCH: 28.2 pg (ref 26.0–34.0)
MCHC: 31.3 g/dL (ref 30.0–36.0)
MCV: 90.1 fL (ref 80.0–100.0)
Monocytes Absolute: 0.3 10*3/uL (ref 0.1–1.0)
Monocytes Relative: 2 %
Neutro Abs: 11.5 10*3/uL — ABNORMAL HIGH (ref 1.7–7.7)
Neutrophils Relative %: 91 %
Platelets: 577 10*3/uL — ABNORMAL HIGH (ref 150–400)
RBC: 3.83 MIL/uL — ABNORMAL LOW (ref 3.87–5.11)
RDW: 13.2 % (ref 11.5–15.5)
WBC: 12.8 10*3/uL — ABNORMAL HIGH (ref 4.0–10.5)
nRBC: 0 % (ref 0.0–0.2)

## 2019-07-23 LAB — GLUCOSE, CAPILLARY
Glucose-Capillary: 217 mg/dL — ABNORMAL HIGH (ref 70–99)
Glucose-Capillary: 202 mg/dL — ABNORMAL HIGH (ref 70–99)
Glucose-Capillary: 244 mg/dL — ABNORMAL HIGH (ref 70–99)
Glucose-Capillary: 365 mg/dL — ABNORMAL HIGH (ref 70–99)

## 2019-07-23 MED ORDER — FUROSEMIDE 40 MG PO TABS
40.0000 mg | ORAL_TABLET | Freq: Every day | ORAL | Status: DC
  Administered 2019-07-23 – 2019-07-26 (×4): 40 mg via ORAL
  Filled 2019-07-23 (×4): qty 1

## 2019-07-23 MED ORDER — VANCOMYCIN HCL 10 G IV SOLR
1250.0000 mg | INTRAVENOUS | Status: DC
  Administered 2019-07-23: 1250 mg via INTRAVENOUS
  Filled 2019-07-23: qty 1250

## 2019-07-23 NOTE — Progress Notes (Signed)
Pharmacy Antibiotic Note  Melissa [name] is a 85 y.o. female admitted on 07/10/2019 with recurrent PNA, recently completed antibiotic course 10/5 with Augmentin. Pharmacy has been consulted for vancomycin/cefepime dosing - day #4 restart. SCr trend back down to 1.3.  Plan: Cefepime 2g IV q24h Increase vancomycin to 1250 mg IV Q 48 hrs. Goal AUC 400-550. Expected AUC: 483 SCr used: 1.3 Monitor clinical progress, c/s, renal function F/u de-escalation plan/LOT, vancomycin levels as indicated   Height: 5\' 5"  (165.1 cm) Weight: 150 lb 6.4 oz (68.2 kg) IBW/kg (Calculated) : 57  Temp (24hrs), Avg:98.1 F (36.7 C), Min:97.8 F (36.6 C), Max:98.2 F (36.8 C)  Recent Labs  Lab 07/18/19 0937 07/19/19 0820 07/20/19 0334 07/20/19 0934 07/21/19 0338 07/22/19 0342 07/23/19 0434  WBC  --  13.5* 13.2*  --  12.9* 16.5* 12.8*  CREATININE 1.11* 1.11*  --  1.27*  --  1.53* 1.30*    Estimated Creatinine Clearance: 26.4 mL/min (A) (by C-G formula based on SCr of 1.3 mg/dL (H)).    No Known Allergies  Antimicrobials this admission: 10/6 vancomycin >>  10/6 cefepime >>  azithromycin 9/26>>>9/29 Rocephin 9/26 >> 9/27 Zosyn 9/27>>>9/30 augmentin 9/20>>>10/5  Microbiology results: 9/26 covid - neg 9/27 sputum - nf 9/30 resp panel - neg 10/6 AFB - neg 10/8 mrsa pcr - neg  Elicia Lamp, PharmD, BCPS Clinical Pharmacist 07/23/2019 9:18 AM

## 2019-07-23 NOTE — Progress Notes (Signed)
NAME:  Melissa [name], MRN:  [id number], DOB:  1928/10/07, LOS: 41 ADMISSION DATE:  07/10/2019, CONSULTATION DATE:   REFERRING MD: Doristine Bosworth , CHIEF COMPLAINT: New hypoxic respiratory failure  Brief History   85 year old female patient with multiple medical comorbidities most importantly including history of bronchiectasis (felt either NTM or aspiration related), mild obstructive lung disease with FEV1 69% with 5% improvement following bronchodilators and systolic cardiomyopathy with EF 20%.  Admitted 9/26 with new hypoxic respiratory failure and working diagnosis of community-acquired pneumonia versus aspiration pulmonary.  PCCM consulted 9/30 for ongoing supplemental oxygen needs.  10/6 due to continued hypoxia empiric antibiotics ans steroids were resumed, autoimmune and vasculitis profile as well as ERS were sent.   Past Medical History  Recurrent pneumonia Chronic cough Bronchiectasis felt possibly either secondary to MAI or chronic aspiration Dysphasia is identified by modified barium swallow August 2019 Mild obstructive disease with FEV1 69%, improved to 74% with bronchodilators by spirometry September 8115 Chronic systolic heart failure with ejection fraction of 20% Hypertension Atrial fibrillation on Eliquis Diabetes  Significant Hospital Events   9/26 admitted. Started on oxygen azith and rocephin 9/27 feeling a little better but still short of breath with activity still having cough 9/28: Antibiotics broadened, placed on Zosyn instead of Rocephin to cover for possible aspiration 9/29: Still short of breath down to 3 L nasal cannula working with physical therapy, 9/30: Pulmonary consulted as patient still oxygen dependent. 10/5 still on 5-6 liters CXR not much improved. abx stopped.  10/6 cxr worse. No sig fever spike. CBC about the same.  10/8 - No significant events reported overnight. She is sitting up in bedside recliner, reports improvement in dyspnea and cough compared to  yesterday  Consults:  Pulmonary consult on 9/30  Procedures:    Significant Diagnostic Tests:  CT chest 10/6 >> Severe multilobar bronchopneumonia, no definite findings to indicate underlying interstitial lung disease. Small bilateral pleural effusions, aortic atherosclerosis. Hepatic steatosis     Micro Data:  COVID 9/26 >> neg Sputum culture 9/27 >> Normal flora Respiratory viral panel 9/30 >> eg Urine strep antigen 9/30 >> neg Urine Legionella antigen 9/30 >> neg Induced sputum for afb 10/6 >> neg   Antimicrobials:  azithromycin 9/26 >> Rocephin 9/26 >>9/27 Zosyn 9/27>>>9/30 augmentin 9/20>>>10/5 Cefepime 10/6 >>  Vancomycin 10/6 >>  Interim history/subjective:   10/9 - down to [facility]. Reports not feeling better and dealing with back pain. Speech eval without aspiration   Objective   Blood pressure (!) 129/58, pulse 88, temperature 98 F (36.7 C), temperature source Oral, resp. rate (!) 24, height 5' 5"  (1.651 m), weight 68.2 kg, SpO2 95 %.        Intake/Output Summary (Last 24 hours) at 07/23/2019 1044 Last data filed at 07/23/2019 0500 Gross per 24 hour  Intake -  Output 1800 ml  Net -1800 ml   Filed Weights   07/11/19 1415  Weight: 68.2 kg   General Appearance:  Looks very deconditioned Head:  Normocephalic, without obvious abnormality, atraumatic Eyes:  PERRL - yes, conjunctiva/corneas - muddy     Ears:  Normal external ear canals, both ears Nose:  G tube - no but has 2L [place] Throat:  ETT TUBE - no , OG tube - no Neck:  Supple,  No enlargement/tenderness/nodules Lungs: No distress, wheeze resolved. Bilateral bibasal crackles + Heart:  S1 and S2 normal, no murmur, CVP - no.  Pressors - no Abdomen:  Soft, no masses, no organomegaly Genitalia / Rectal:  Not done Extremities:  Extremities- intact Skin:  ntact in exposed areas . Sacral area - not examind Neurologic:  Sedation - none -> RASS - +1 . Moves all 4s - yes. CAM-ICU - neg . Orientation - x3+       Resolved Hospital Problem list     Assessment & Plan:   Acute on chronic hypoxic respiratory failure CAP (NOS) bronchiectasis  Dysphagia  Obstructive lung disease w/ BD response  Systolic CM (EF 13%) PAF (on BB and eliquis)  CKD stage III DM type II HL Hypothyroidism Chronic back pain  Anemia of chronic disease Fluid and electrolyte imbalance  Recent death in family.    Acute on chronic hypoxic respiratory failure in the setting of bilateral patchy pulmonary infiltrates with known history of bronchiectasis and mild obstructive lung disease w/ + BD response .   Autoimmune workup; ESR > 97, ANA > neg, ANCA > neg, GBM > neg,  Anti-DNA > neg, RF > 21.9 and nearly neg, CCP > neg, SSA > neg, SSB > neg,  Anti-SCL > neg  -Differential diagnosis includes: aspiration PNA vs BTX flare vs atypical infection (we have been concerned about MAI) also suspect that there is an element of edema  -abx originally stopped 10/5; wbc still trending down; no fever spikes -now on 3-4 liters; had been on 6 on 10/5; although there was concern about accuracy of pulse ox from forehead -HRCT read as severe multilobar bronchopneumonia, PCT and RVP negative     07/23/2019 - improved but very deconditioned  P: Continue supplemental oxygen and BD Wean O2 for sats >92% Continue diuresing as tolerated Encourage frequent pulmonary hygiene habits including flutter valve and IS use  Continue empiric antibiotics - total < 7 days IV steroids - chagne to po 07/24/19 by [facility] and close out in 2-3 week taper May need FOB during hospitalization vs outpatient  - if stable  Systolic CM (EF 08%) w/ h/o PAF; Had recurrent RVR 9/29 -currently NSR w/ last QTc 488 P: Continuous telemetry  Further diuretics per primary  Continue Amiodarone with slow taper and beta blocker Not a good candidate for long term Amiodarone use given underlying lung disease  Continue DOAC   Deconditioning  Strongly recommend LTAC   Future Appointments  Date Time Provider [place]  08/02/2019 11:00 AM Margaretha Seeds, MD [facility] None  08/10/2019  1:45 PM [facility] None  08/16/2019 11:00 AM [name], MD [facility]  08/17/2019  4:30 PM [name], MD [facility] None      CCM will see again Monday 07/26/2019      SIGNATURE    Dr. Brand Males, M.D., F.C.C.P,  Pulmonary and Critical Care Medicine Staff Physician, [place] Director - Interstitial Lung Disease  Program  Pulmonary [place] [facility] [place], [place], [address]  Pager: [phone], If no answer or between  15:00h - 7:00h: call [phone] Telephone: [phone]  10:51 AM 07/23/2019

## 2019-07-23 NOTE — Progress Notes (Signed)
PT Cancellation Note  Patient Details Name: Melissa [name] MRN: [id number] DOB: [birth year]   Cancelled Treatment:    Reason Eval/Treat Not Completed: (P) Fatigue/lethargy limiting ability to participate RN reports pt feeling sick today and now resting, request deferral for treatment. PT will follow back next week.  Shakya Sebring B. Migdalia Dk PT, DPT Acute Rehabilitation Services Pager [phone] Office [phone]    [place] 07/23/2019, 3:56 PM

## 2019-07-23 NOTE — Progress Notes (Signed)
PROGRESS NOTE    Melissa [name]  PFY:[id number] DOB: 01-03-1928 DOA: 07/10/2019 PCP: [name], MD   Brief Narrative:  Patient is 85 year old female with history of chronic systolic CHF with ejection fraction of 20%, diabetes type 2, hypertension, A. fib on Eliquis, hyperlipidemia who presented to the emergency department with complaints of cough for 2 weeks.  She has history of recurrent pneumonia and chronic cough.  She follows with Dr. [name], pulmonology.  High-resolution CT scan in September 2019 showed bronchiectatic changes with scaring with concern for MAI versus COPD versus recurrent aspiration.    She was admitted for acute respiratory failure due to bronchopneumonia, possible exacerbation of bronchiectasis. COVID-19 test was negative. Chest x-ray on 07/13/2019 showed bilateral patchy opacities ,PCCM consulted on 07/14/2019.  Hospital course remarkable for development  of A. fib with RVR for which she was started on amiodarone. Her respiratory status has not significantly improved.  She is unstable for bronchoscopy so the plan is to continue antibiotics and monitor for improvement.   Assessment & Plan:   Principal Problem:   Bronchopneumonia Active Problems:   Type 2 diabetes mellitus with diabetic neuropathy, with long-term current use of insulin (HCC)   HLD (hyperlipidemia)   Left ventricular dysfunction   Hypothyroidism, acquired   Essential hypertension   Chronic kidney disease, stage III (moderate) (HCC)   Paroxysmal atrial fibrillation (HCC)   Chronic anticoagulation   Chronic systolic heart failure (HCC)   Dyspnea   Acute respiratory failure with hypoxia (HCC)   Lobar pneumonia (HCC)   Atrial fibrillation with RVR (HCC)   Acute respiratory failure with hypoxia: Suspected to have bronchopneumonia on presentation.  Likely progressive bronchiectasis.  Covid-19  negative.  Chest imaging showing bilateral multiple patchy ill-defined opacities concerning for multifocal  pneumonia.  Continues to require 4-5 L of oxygen per minute.  Not on oxygen at home.  No history of COPD or asthma.  History of recurrent pneumonia in the past.  Continue bronchodilators.  Planned  bronchoscopy by PCCM but canceled because her respiratory status is not  stable.  Also started on  steroids.  Continue broad-spectrum antibiotics for now. Lasix held  due to worsening kidney function but will resume at 40 mg daily.  Paroxysmal A. fib: Currently in normal sinus rhythm.  Went into A. fib with RVR few days ago.  Cardiology was following.  Started on amiodarone.  On Eliquis.  On metoprolol for rate control  Chronic systolic CHF: Last echocardiogram on 2018 showed ejection fraction of 15 to 20%.  She follows with [name], cardiology.  Continue lasix 40 mg daily.  Diabetes type 2: Recent hemoglobin A1c 6.1.  Continue current insulin regimen  CKD stage III: Baseline creatinine of 1.2-1.5.  Near basline  Hyperlipidemia: Continue statin  Chronic back pain: Continue supportive care, lidocaine patch  Chronic normocytic anemia: Likely history with chronic disease.  Continue to monitor.  Currently stable  Hypothyroidism: Continue Synthyroid  Deconditioning/debility: She was seen by PT/OT and recommended SNF on discharge.  Goals of care: Very elderly female with multiple comorbidities.  Respiratory status has not significantly improved.  If her overall status does not improve in the next few days, we may need to discuss about goals of care.  Which will most likely involve palliative care.         DVT prophylaxis: Eliquis Code Status: Full Family Communication: daughter at the bed side on 07/21/19 Disposition Plan: SNF when respiratory status improves  Consultants: PCCM  Procedures:None  Antimicrobials:  Anti-infectives (From admission, onward)   Start     Dose/Rate Route Frequency Ordered Stop   07/24/19 1230  vancomycin (VANCOCIN) IVPB 1000 mg/200 mL premix  Status:   Discontinued     1,000 mg 200 mL/hr over 60 Minutes Intravenous Every 48 hours 07/22/19 1049 07/23/19 0919   07/23/19 0919  vancomycin (VANCOCIN) 1,250 mg in sodium chloride 0.9 % 250 mL IVPB     1,250 mg 166.7 mL/hr over 90 Minutes Intravenous Every 48 hours 07/23/19 0919     07/20/19 1030  ceFEPIme (MAXIPIME) 2 g in sodium chloride 0.9 % 100 mL IVPB     2 g 200 mL/hr over 30 Minutes Intravenous Every 24 hours 07/20/19 1026     07/20/19 1030  vancomycin (VANCOCIN) 1,250 mg in sodium chloride 0.9 % 250 mL IVPB  Status:  Discontinued     1,250 mg 166.7 mL/hr over 90 Minutes Intravenous Every 48 hours 07/20/19 1026 07/22/19 1049   07/15/19 2200  amoxicillin-clavulanate (AUGMENTIN) 500-125 MG per tablet 500 mg  Status:  Discontinued     1 tablet Oral 2 times daily 07/15/19 1612 07/19/19 1539   07/14/19 1800  amoxicillin-clavulanate (AUGMENTIN) 875-125 MG per tablet 1 tablet  Status:  Discontinued     1 tablet Oral Every 12 hours 07/14/19 1450 07/15/19 1612   07/11/19 1830  cefTRIAXone (ROCEPHIN) 1 g in sodium chloride 0.9 % 100 mL IVPB  Status:  Discontinued     1 g 200 mL/hr over 30 Minutes Intravenous Every 24 hours 07/10/19 1916 07/11/19 0813   07/11/19 1830  azithromycin (ZITHROMAX) 500 mg in sodium chloride 0.9 % 250 mL IVPB  Status:  Discontinued     500 mg 250 mL/hr over 60 Minutes Intravenous Every 24 hours 07/10/19 1916 07/14/19 1500   07/11/19 1100  piperacillin-tazobactam (ZOSYN) IVPB 3.375 g  Status:  Discontinued     3.375 g 12.5 mL/hr over 240 Minutes Intravenous Every 8 hours 07/11/19 1030 07/14/19 1450   07/11/19 0815  piperacillin-tazobactam (ZOSYN) IVPB 3.375 g  Status:  Discontinued     3.375 g 12.5 mL/hr over 240 Minutes Intravenous Every 8 hours 07/11/19 0813 07/11/19 1030   07/10/19 1800  cefTRIAXone (ROCEPHIN) 1 g in sodium chloride 0.9 % 100 mL IVPB     1 g 200 mL/hr over 30 Minutes Intravenous  Once 07/10/19 1749 07/10/19 1922   07/10/19 1800  azithromycin  (ZITHROMAX) tablet 500 mg     500 mg Oral  Once 07/10/19 1749 07/10/19 1834      Subjective:  Patient seen and examined at bedside this morning.  She might have slightly improved today.  Still very weak, deconditioned/debilitated.  Was on 4 L of oxygen this morning.  Cough is improved since yesterday.  She was asking when she will be able to go home..  Objective: Vitals:   07/23/19 0859 07/23/19 0900 07/23/19 0922 07/23/19 1100  BP:  (!) 129/58 (!) 129/58 (!) 127/53  Pulse:   88   Resp:  (!) 24  (!) 22  Temp:    98 F (36.7 C)  TempSrc:    Oral  SpO2: 95% 95%  91%  Weight:      Height:        Intake/Output Summary (Last 24 hours) at 07/23/2019 1158 Last data filed at 07/23/2019 0500 Gross per 24 hour  Intake --  Output 900 ml  Net -900 ml   Filed Weights   07/11/19 1415  Weight: 68.2 kg  Examination:  General exam: Pleasant elderly female, extremely deconditioned/debilitated HEENT:PERRL,Oral mucosa moist, Ear/Nose normal on gross exam Respiratory system: Bilateral decreased air entry, crackles on the basis Cardiovascular system: S1 & S2 heard, RRR. No JVD, murmurs, rubs, gallops or clicks. Gastrointestinal system: Abdomen is nondistended, soft and nontender. No organomegaly or masses felt. Normal bowel sounds heard. Central nervous system: Alert and oriented. No focal neurological deficits. Extremities: No edema, no clubbing ,no cyanosis, distal peripheral pulses palpable. Skin: No rashes, lesions or ulcers,no icterus ,no pallor     Data Reviewed: I have personally reviewed following labs and imaging studies  CBC: Recent Labs  Lab 07/19/19 0820 07/20/19 0334 07/21/19 0338 07/22/19 0342 07/23/19 0434  WBC 13.5* 13.2* 12.9* 16.5* 12.8*  NEUTROABS 10.7* 10.5* 12.0* 15.1* 11.5*  HGB 10.7* 10.0* 10.7* 10.0* 10.8*  HCT 34.3* 31.5* 33.0* 30.9* 34.5*  MCV 91.2 89.2 88.9 88.5 90.1  PLT 398 417* 462* 474* 161*   Basic Metabolic Panel: Recent Labs  Lab  07/18/19 0937 07/19/19 0820 07/20/19 0934 07/22/19 0342 07/23/19 0434  NA 132* 135 131* 132* 132*  K 4.6 4.1 4.8 5.3* 5.4*  CL 87* 88* 83* 87* 85*  CO2 33* 36* 35* 37* 37*  GLUCOSE 205* 116* 190* 190* 249*  BUN 12 11 15  34* 35*  CREATININE 1.11* 1.11* 1.27* 1.53* 1.30*  CALCIUM 8.3* 8.3* 8.5* 8.1* 8.4*   GFR: Estimated Creatinine Clearance: 26.4 mL/min (A) (by C-G formula based on SCr of 1.3 mg/dL (H)). Liver Function Tests: No results for input(s): AST, ALT, ALKPHOS, BILITOT, PROT, ALBUMIN in the last 168 hours. No results for input(s): LIPASE, AMYLASE in the last 168 hours. No results for input(s): AMMONIA in the last 168 hours. Coagulation Profile: No results for input(s): INR, PROTIME in the last 168 hours. Cardiac Enzymes: No results for input(s): CKTOTAL, CKMB, CKMBINDEX, TROPONINI in the last 168 hours. BNP (last 3 results) No results for input(s): PROBNP in the last 8760 hours. HbA1C: No results for input(s): HGBA1C in the last 72 hours. CBG: Recent Labs  Lab 07/22/19 0742 07/22/19 1133 07/22/19 1644 07/22/19 2106 07/23/19 0749  GLUCAP 198* 218* 288* 244* 217*   Lipid Profile: No results for input(s): CHOL, HDL, LDLCALC, TRIG, CHOLHDL, LDLDIRECT in the last 72 hours. Thyroid Function Tests: No results for input(s): TSH, T4TOTAL, FREET4, T3FREE, THYROIDAB in the last 72 hours. Anemia Panel: No results for input(s): VITAMINB12, FOLATE, FERRITIN, TIBC, IRON, RETICCTPCT in the last 72 hours. Sepsis Labs: No results for input(s): PROCALCITON, LATICACIDVEN in the last 168 hours.  Recent Results (from the past 240 hour(s))  Respiratory Panel by PCR     Status: None   Collection Time: 07/14/19  7:35 PM   Specimen: Nasopharyngeal Swab; Respiratory  Result Value Ref Range Status   Adenovirus NOT DETECTED NOT DETECTED Final   Coronavirus 229E NOT DETECTED NOT DETECTED Final    Comment: (NOTE) The Coronavirus on the Respiratory Panel, DOES NOT test for the novel   Coronavirus (2019 nCoV)    Coronavirus HKU1 NOT DETECTED NOT DETECTED Final   Coronavirus NL63 NOT DETECTED NOT DETECTED Final   Coronavirus OC43 NOT DETECTED NOT DETECTED Final   Metapneumovirus NOT DETECTED NOT DETECTED Final   Rhinovirus / Enterovirus NOT DETECTED NOT DETECTED Final   Influenza A NOT DETECTED NOT DETECTED Final   Influenza B NOT DETECTED NOT DETECTED Final   Parainfluenza Virus 1 NOT DETECTED NOT DETECTED Final   Parainfluenza Virus 2 NOT DETECTED NOT DETECTED Final  Parainfluenza Virus 3 NOT DETECTED NOT DETECTED Final   Parainfluenza Virus 4 NOT DETECTED NOT DETECTED Final   Respiratory Syncytial Virus NOT DETECTED NOT DETECTED Final   Bordetella pertussis NOT DETECTED NOT DETECTED Final   Chlamydophila pneumoniae NOT DETECTED NOT DETECTED Final   Mycoplasma pneumoniae NOT DETECTED NOT DETECTED Final    Comment: Performed at [facility], [place] [address] [place], [place] [address]  Acid Fast Smear (AFB)     Status: None   Collection Time: 07/20/19  4:44 AM   Specimen: Tracheal Aspirate; Sputum  Result Value Ref Range Status   AFB Specimen Processing Concentration  Final   Acid Fast Smear Negative  Final    Comment: (NOTE) Performed At: [facility] [address] [place], [place] [id number] Rush Farmer MD RC:[id number]    Source (AFB) SPUTUM  Final    Comment: Performed at [facility], [place] [address], [place], [place] [address]  MRSA PCR Screening     Status: None   Collection Time: 07/22/19  3:42 PM   Specimen: Nasopharyngeal  Result Value Ref Range Status   MRSA by PCR NEGATIVE NEGATIVE Final    Comment:        The GeneXpert MRSA Assay (FDA approved for NASAL specimens only), is one component of a comprehensive MRSA colonization surveillance program. It is not intended to diagnose MRSA infection nor to guide or monitor treatment for MRSA infections. Performed at [facility], [place] [address], [place], [place]  [address]          Radiology Studies: No results found.      Scheduled Meds:  amiodarone  200 mg Oral BID   Followed by   Derrill Memo ON 07/30/2019] amiodarone  200 mg Oral Daily   apixaban  5 mg Oral BID   atorvastatin  40 mg Oral Daily   cholecalciferol  1,000 Units Oral Daily   FLUoxetine  20 mg Oral Daily   furosemide  40 mg Oral Daily   guaiFENesin-dextromethorphan  10 mL Oral Q6H   influenza vaccine adjuvanted  0.5 mL Intramuscular Tomorrow-1000   insulin aspart  0-5 Units Subcutaneous QHS   insulin aspart  0-9 Units Subcutaneous TID WC   insulin aspart  3 Units Subcutaneous TID WC   insulin detemir  12 Units Subcutaneous QHS   ipratropium  0.5 mg Nebulization TID   levalbuterol  0.63 mg Nebulization TID   levothyroxine  50 mcg Oral Daily   lidocaine  1 patch Transdermal Q24H   methylPREDNISolone (SOLU-MEDROL) injection  40 mg Intravenous Q12H   metoprolol succinate  12.5 mg Oral Daily   multivitamin  1 tablet Oral BID   multivitamins with iron  1 tablet Oral Daily   omega-3 acid ethyl esters  1,000 mg Oral Daily   pantoprazole  40 mg Oral Daily   pramipexole  0.75 mg Oral QHS   pregabalin  50 mg Oral TID   traZODone  100 mg Oral QHS   umeclidinium bromide  1 puff Inhalation Daily   Continuous Infusions:  ceFEPime (MAXIPIME) IV 2 g (07/22/19 0918)   vancomycin       LOS: 13 days    Time spent:35 mins. More than 50% of that time was spent in counseling and/or coordination of care.      Shelly Coss, MD [facility] Pager [phone]  If 7PM-7AM, please contact night-coverage [email or web address] Password [email or web address] 07/23/2019, 11:58 AM

## 2019-07-24 LAB — CBC WITH DIFFERENTIAL/PLATELET
Abs Immature Granulocytes: 0.19 10*3/uL — ABNORMAL HIGH (ref 0.00–0.07)
Basophils Absolute: 0 10*3/uL (ref 0.0–0.1)
Basophils Relative: 0 %
Eosinophils Absolute: 0 10*3/uL (ref 0.0–0.5)
Eosinophils Relative: 0 %
HCT: 35.4 % — ABNORMAL LOW (ref 36.0–46.0)
Hemoglobin: 11.1 g/dL — ABNORMAL LOW (ref 12.0–15.0)
Immature Granulocytes: 1 %
Lymphocytes Relative: 8 %
Lymphs Abs: 1.1 10*3/uL (ref 0.7–4.0)
MCH: 28.2 pg (ref 26.0–34.0)
MCHC: 31.4 g/dL (ref 30.0–36.0)
MCV: 90.1 fL (ref 80.0–100.0)
Monocytes Absolute: 0.3 10*3/uL (ref 0.1–1.0)
Monocytes Relative: 2 %
Neutro Abs: 11.9 10*3/uL — ABNORMAL HIGH (ref 1.7–7.7)
Neutrophils Relative %: 89 %
Platelets: 553 10*3/uL — ABNORMAL HIGH (ref 150–400)
RBC: 3.93 MIL/uL (ref 3.87–5.11)
RDW: 12.9 % (ref 11.5–15.5)
WBC: 13.5 10*3/uL — ABNORMAL HIGH (ref 4.0–10.5)
nRBC: 0 % (ref 0.0–0.2)

## 2019-07-24 LAB — GLUCOSE, CAPILLARY
Glucose-Capillary: 270 mg/dL — ABNORMAL HIGH (ref 70–99)
Glucose-Capillary: 160 mg/dL — ABNORMAL HIGH (ref 70–99)
Glucose-Capillary: 264 mg/dL — ABNORMAL HIGH (ref 70–99)
Glucose-Capillary: 245 mg/dL — ABNORMAL HIGH (ref 70–99)

## 2019-07-24 LAB — BASIC METABOLIC PANEL
Anion gap: 9 (ref 5–15)
BUN: 36 mg/dL — ABNORMAL HIGH (ref 8–23)
CO2: 36 mmol/L — ABNORMAL HIGH (ref 22–32)
Calcium: 8.7 mg/dL — ABNORMAL LOW (ref 8.9–10.3)
Chloride: 86 mmol/L — ABNORMAL LOW (ref 98–111)
Creatinine, Ser: 1.27 mg/dL — ABNORMAL HIGH (ref 0.44–1.00)
GFR calc Af Amer: 43 mL/min — ABNORMAL LOW (ref >60)
GFR calc non Af Amer: 37 mL/min — ABNORMAL LOW (ref >60)
Glucose, Bld: 256 mg/dL — ABNORMAL HIGH (ref 70–99)
Potassium: 6.2 mmol/L — ABNORMAL HIGH (ref 3.5–5.1)
Sodium: 131 mmol/L — ABNORMAL LOW (ref 135–145)

## 2019-07-24 LAB — POTASSIUM: Potassium: 4.3 mmol/L (ref 3.5–5.1)

## 2019-07-24 MED ORDER — SODIUM POLYSTYRENE SULFONATE 15 GM/60ML PO SUSP
30.0000 g | Freq: Once | ORAL | Status: AC
  Administered 2019-07-24: 30 g via ORAL
  Filled 2019-07-24: qty 120

## 2019-07-24 MED ORDER — CALCIUM GLUCONATE-NACL 1-0.675 GM/50ML-% IV SOLN
1.0000 g | Freq: Once | INTRAVENOUS | Status: AC
  Administered 2019-07-24: 1000 mg via INTRAVENOUS
  Filled 2019-07-24: qty 50

## 2019-07-24 MED ORDER — DEXTROSE 50 % IV SOLN
1.0000 | Freq: Once | INTRAVENOUS | Status: AC
  Administered 2019-07-24: 50 mL via INTRAVENOUS
  Filled 2019-07-24: qty 50

## 2019-07-24 MED ORDER — INSULIN ASPART 100 UNIT/ML ~~LOC~~ SOLN
10.0000 [IU] | Freq: Once | SUBCUTANEOUS | Status: AC
  Administered 2019-07-24: 10 [IU] via INTRAVENOUS

## 2019-07-24 NOTE — Progress Notes (Addendum)
PROGRESS NOTE    Melissa [name]  STM:[id number] DOB: 1928-04-02 DOA: 07/10/2019 PCP: [name], MD   Brief Narrative:  Patient is 85 year old female with history of chronic systolic CHF with ejection fraction of 20%, diabetes type 2, hypertension, A. fib on Eliquis, hyperlipidemia who presented to the emergency department with complaints of cough for 2 weeks.  She has history of recurrent pneumonia and chronic cough.  She follows with Dr. [name], pulmonology.  High-resolution CT scan in September 2019 showed bronchiectatic changes with scaring with concern for MAI versus COPD versus recurrent aspiration.    She was admitted for acute respiratory failure due to bronchopneumonia, possible exacerbation of bronchiectasis. COVID-19 test was negative. Chest x-ray on 07/13/2019 showed bilateral patchy opacities ,PCCM consulted on 07/14/2019.  Hospital course remarkable for development  of A. fib with RVR for which she was started on amiodarone. Her respiratory status has not significantly improved.  She is unstable for bronchoscopy so the plan is to continue antibiotics and monitor for improvement.   Assessment & Plan:   Principal Problem:   Bronchopneumonia Active Problems:   Type 2 diabetes mellitus with diabetic neuropathy, with long-term current use of insulin (HCC)   HLD (hyperlipidemia)   Left ventricular dysfunction   Hypothyroidism, acquired   Essential hypertension   Chronic kidney disease, stage III (moderate) (HCC)   Paroxysmal atrial fibrillation (HCC)   Chronic anticoagulation   Chronic systolic heart failure (HCC)   Dyspnea   Acute respiratory failure with hypoxia (HCC)   Lobar pneumonia (HCC)   Atrial fibrillation with RVR (HCC)   Acute respiratory failure with hypoxia: Suspected to have bronchopneumonia on presentation.  Likely progressive bronchiectasis.  Covid-19  negative.  Chest imaging showing bilateral multiple patchy ill-defined opacities concerning for multifocal  pneumonia.  Continues to require 3-4L of oxygen per minute.  Not on oxygen at home.  No history of COPD or asthma.  History of recurrent pneumonia in the past.  Continue bronchodilators.  Planned  bronchoscopy by PCCM but canceled because her respiratory status is not  stable.  Also started on  steroids.  Continue cefepime,Day 5/7 Lasix held  due to worsening kidney function but will resume at 40 mg daily.  Paroxysmal A. fib: Currently in normal sinus rhythm.  Went into A. fib with RVR few days ago.  Cardiology was following.  Started on amiodarone.  On Eliquis.  On metoprolol for rate control  Chronic systolic CHF: Last echocardiogram on 2018 showed ejection fraction of 15 to 20%.  She follows with [name], cardiology.  Continue lasix 40 mg daily.  Diabetes type 2: Recent hemoglobin A1c 6.1.  Continue current insulin regimen  CKD stage III: Baseline creatinine of 1.2-1.5.  Near basline  Hyperlipidemia: Continue statin  Chronic back pain: Continue supportive care, lidocaine patch  Chronic normocytic anemia: Likely history with chronic disease.  Continue to monitor.  Currently stable  Hypothyroidism: Continue Synthyroid  Deconditioning/debility: She was seen by PT/OT and recommended SNF on discharge.  Hyperkalemia: She will be given Kayexalate, calcium gluconate, D50 and insulin IV.  Will check potassium level later today.  Goals of care: Very elderly female with multiple comorbidities.  Respiratory status has not significantly improved.  If her overall status does not improve in the next few days, we may need to discuss about goals of care.  Which will most likely involve palliative care.         DVT prophylaxis: Eliquis Code Status: Full Family Communication: daughter at the bed side  on 07/21/19 Disposition Plan: SNF when respiratory status improves  Consultants: PCCM  Procedures:None  Antimicrobials:  Anti-infectives (From admission, onward)   Start     Dose/Rate Route  Frequency Ordered Stop   07/24/19 1230  vancomycin (VANCOCIN) IVPB 1000 mg/200 mL premix  Status:  Discontinued     1,000 mg 200 mL/hr over 60 Minutes Intravenous Every 48 hours 07/22/19 1049 07/23/19 0919   07/23/19 0919  vancomycin (VANCOCIN) 1,250 mg in sodium chloride 0.9 % 250 mL IVPB  Status:  Discontinued     1,250 mg 166.7 mL/hr over 90 Minutes Intravenous Every 48 hours 07/23/19 0919 07/24/19 1111   07/20/19 1030  ceFEPIme (MAXIPIME) 2 g in sodium chloride 0.9 % 100 mL IVPB     2 g 200 mL/hr over 30 Minutes Intravenous Every 24 hours 07/20/19 1026     07/20/19 1030  vancomycin (VANCOCIN) 1,250 mg in sodium chloride 0.9 % 250 mL IVPB  Status:  Discontinued     1,250 mg 166.7 mL/hr over 90 Minutes Intravenous Every 48 hours 07/20/19 1026 07/22/19 1049   07/15/19 2200  amoxicillin-clavulanate (AUGMENTIN) 500-125 MG per tablet 500 mg  Status:  Discontinued     1 tablet Oral 2 times daily 07/15/19 1612 07/19/19 1539   07/14/19 1800  amoxicillin-clavulanate (AUGMENTIN) 875-125 MG per tablet 1 tablet  Status:  Discontinued     1 tablet Oral Every 12 hours 07/14/19 1450 07/15/19 1612   07/11/19 1830  cefTRIAXone (ROCEPHIN) 1 g in sodium chloride 0.9 % 100 mL IVPB  Status:  Discontinued     1 g 200 mL/hr over 30 Minutes Intravenous Every 24 hours 07/10/19 1916 07/11/19 0813   07/11/19 1830  azithromycin (ZITHROMAX) 500 mg in sodium chloride 0.9 % 250 mL IVPB  Status:  Discontinued     500 mg 250 mL/hr over 60 Minutes Intravenous Every 24 hours 07/10/19 1916 07/14/19 1500   07/11/19 1100  piperacillin-tazobactam (ZOSYN) IVPB 3.375 g  Status:  Discontinued     3.375 g 12.5 mL/hr over 240 Minutes Intravenous Every 8 hours 07/11/19 1030 07/14/19 1450   07/11/19 0815  piperacillin-tazobactam (ZOSYN) IVPB 3.375 g  Status:  Discontinued     3.375 g 12.5 mL/hr over 240 Minutes Intravenous Every 8 hours 07/11/19 0813 07/11/19 1030   07/10/19 1800  cefTRIAXone (ROCEPHIN) 1 g in sodium chloride 0.9  % 100 mL IVPB     1 g 200 mL/hr over 30 Minutes Intravenous  Once 07/10/19 1749 07/10/19 1922   07/10/19 1800  azithromycin (ZITHROMAX) tablet 500 mg     500 mg Oral  Once 07/10/19 1749 07/10/19 1834      Subjective:  Patient seen and examined at bedside this morning.  Hemodynamically stable.  IV team was changing the IV line today.  She says she feels better today and she also looked little better.  Cough has improved.  Objective: Vitals:   07/24/19 0600 07/24/19 0700 07/24/19 0838 07/24/19 1100  BP: 129/62 126/64  (!) 123/53  Pulse:      Resp: 20 13 14  (!) 28  Temp:    98 F (36.7 C)  TempSrc:      SpO2: 97% 98% 99% 96%  Weight:      Height:        Intake/Output Summary (Last 24 hours) at 07/24/2019 1153 Last data filed at 07/24/2019 0200 Gross per 24 hour  Intake 350 ml  Output 1000 ml  Net -650 ml   [employer]  07/11/19 1415  Weight: 68.2 kg    Examination:  General exam: Pleasant elderly female, extremely deconditioned/debilitated HEENT:PERRL,Oral mucosa moist, Ear/Nose normal on gross exam Respiratory system: Bilateral decreased air entry,some  crackles on the basis Cardiovascular system: S1 & S2 heard, RRR. No JVD, murmurs, rubs, gallops or clicks. Gastrointestinal system: Abdomen is nondistended, soft and nontender. No organomegaly or masses felt. Normal bowel sounds heard. Central nervous system: Alert and oriented. No focal neurological deficits. Extremities: No edema, no clubbing ,no cyanosis, distal peripheral pulses palpable. Skin: No rashes, lesions or ulcers,no icterus ,no pallor     Data Reviewed: I have personally reviewed following labs and imaging studies  CBC: Recent Labs  Lab 07/20/19 0334 07/21/19 0338 07/22/19 0342 07/23/19 0434 07/24/19 0412  WBC 13.2* 12.9* 16.5* 12.8* 13.5*  NEUTROABS 10.5* 12.0* 15.1* 11.5* 11.9*  HGB 10.0* 10.7* 10.0* 10.8* 11.1*  HCT 31.5* 33.0* 30.9* 34.5* 35.4*  MCV 89.2 88.9 88.5 90.1 90.1  PLT  417* 462* 474* 577* 765*   Basic Metabolic Panel: Recent Labs  Lab 07/19/19 0820 07/20/19 0934 07/22/19 0342 07/23/19 0434 07/24/19 0412  NA 135 131* 132* 132* 131*  K 4.1 4.8 5.3* 5.4* 6.2*  CL 88* 83* 87* 85* 86*  CO2 36* 35* 37* 37* 36*  GLUCOSE 116* 190* 190* 249* 256*  BUN 11 15 34* 35* 36*  CREATININE 1.11* 1.27* 1.53* 1.30* 1.27*  CALCIUM 8.3* 8.5* 8.1* 8.4* 8.7*   GFR: Estimated Creatinine Clearance: 27 mL/min (A) (by C-G formula based on SCr of 1.27 mg/dL (H)). Liver Function Tests: No results for input(s): AST, ALT, ALKPHOS, BILITOT, PROT, ALBUMIN in the last 168 hours. No results for input(s): LIPASE, AMYLASE in the last 168 hours. No results for input(s): AMMONIA in the last 168 hours. Coagulation Profile: No results for input(s): INR, PROTIME in the last 168 hours. Cardiac Enzymes: No results for input(s): CKTOTAL, CKMB, CKMBINDEX, TROPONINI in the last 168 hours. BNP (last 3 results) No results for input(s): PROBNP in the last 8760 hours. HbA1C: No results for input(s): HGBA1C in the last 72 hours. CBG: Recent Labs  Lab 07/23/19 1159 07/23/19 1703 07/23/19 2125 07/24/19 0717 07/24/19 1131  GLUCAP 202* 244* 365* 270* 160*   Lipid Profile: No results for input(s): CHOL, HDL, LDLCALC, TRIG, CHOLHDL, LDLDIRECT in the last 72 hours. Thyroid Function Tests: No results for input(s): TSH, T4TOTAL, FREET4, T3FREE, THYROIDAB in the last 72 hours. Anemia Panel: No results for input(s): VITAMINB12, FOLATE, FERRITIN, TIBC, IRON, RETICCTPCT in the last 72 hours. Sepsis Labs: No results for input(s): PROCALCITON, LATICACIDVEN in the last 168 hours.  Recent Results (from the past 240 hour(s))  Respiratory Panel by PCR     Status: None   Collection Time: 07/14/19  7:35 PM   Specimen: Nasopharyngeal Swab; Respiratory  Result Value Ref Range Status   Adenovirus NOT DETECTED NOT DETECTED Final   Coronavirus 229E NOT DETECTED NOT DETECTED Final    Comment:  (NOTE) The Coronavirus on the Respiratory Panel, DOES NOT test for the novel  Coronavirus (2019 nCoV)    Coronavirus HKU1 NOT DETECTED NOT DETECTED Final   Coronavirus NL63 NOT DETECTED NOT DETECTED Final   Coronavirus OC43 NOT DETECTED NOT DETECTED Final   Metapneumovirus NOT DETECTED NOT DETECTED Final   Rhinovirus / Enterovirus NOT DETECTED NOT DETECTED Final   Influenza A NOT DETECTED NOT DETECTED Final   Influenza B NOT DETECTED NOT DETECTED Final   Parainfluenza Virus 1 NOT DETECTED NOT DETECTED Final  Parainfluenza Virus 2 NOT DETECTED NOT DETECTED Final   Parainfluenza Virus 3 NOT DETECTED NOT DETECTED Final   Parainfluenza Virus 4 NOT DETECTED NOT DETECTED Final   Respiratory Syncytial Virus NOT DETECTED NOT DETECTED Final   Bordetella pertussis NOT DETECTED NOT DETECTED Final   Chlamydophila pneumoniae NOT DETECTED NOT DETECTED Final   Mycoplasma pneumoniae NOT DETECTED NOT DETECTED Final    Comment: Performed at [facility], [place] [address] [place], [place] [address]  Acid Fast Smear (AFB)     Status: None   Collection Time: 07/20/19  4:44 AM   Specimen: Tracheal Aspirate; Sputum  Result Value Ref Range Status   AFB Specimen Processing Concentration  Final   Acid Fast Smear Negative  Final    Comment: (NOTE) Performed At: [facility] [address] [place], [place] [id number] Rush Farmer MD QP:[id number]    Source (AFB) SPUTUM  Final    Comment: Performed at [facility], [place] [address], [place], [place] [address]  MRSA PCR Screening     Status: None   Collection Time: 07/22/19  3:42 PM   Specimen: Nasopharyngeal  Result Value Ref Range Status   MRSA by PCR NEGATIVE NEGATIVE Final    Comment:        The GeneXpert MRSA Assay (FDA approved for NASAL specimens only), is one component of a comprehensive MRSA colonization surveillance program. It is not intended to diagnose MRSA infection nor to guide or monitor treatment for MRSA  infections. Performed at [facility], [place] [address], [place], [place] [address]          Radiology Studies: No results found.      Scheduled Meds:  amiodarone  200 mg Oral BID   Followed by   Derrill Memo ON 07/30/2019] amiodarone  200 mg Oral Daily   apixaban  5 mg Oral BID   atorvastatin  40 mg Oral Daily   cholecalciferol  1,000 Units Oral Daily   FLUoxetine  20 mg Oral Daily   furosemide  40 mg Oral Daily   guaiFENesin-dextromethorphan  10 mL Oral Q6H   influenza vaccine adjuvanted  0.5 mL Intramuscular Tomorrow-1000   insulin aspart  0-5 Units Subcutaneous QHS   insulin aspart  0-9 Units Subcutaneous TID WC   insulin aspart  3 Units Subcutaneous TID WC   insulin detemir  12 Units Subcutaneous QHS   ipratropium  0.5 mg Nebulization TID   levalbuterol  0.63 mg Nebulization TID   levothyroxine  50 mcg Oral Daily   lidocaine  1 patch Transdermal Q24H   methylPREDNISolone (SOLU-MEDROL) injection  40 mg Intravenous Q12H   metoprolol succinate  12.5 mg Oral Daily   multivitamin  1 tablet Oral BID   multivitamins with iron  1 tablet Oral Daily   omega-3 acid ethyl esters  1,000 mg Oral Daily   pantoprazole  40 mg Oral Daily   pramipexole  0.75 mg Oral QHS   pregabalin  50 mg Oral TID   traZODone  100 mg Oral QHS   umeclidinium bromide  1 puff Inhalation Daily   Continuous Infusions:  ceFEPime (MAXIPIME) IV 2 g (07/24/19 1004)     LOS: 14 days    Time spent:35 mins. More than 50% of that time was spent in counseling and/or coordination of care.      Shelly Coss, MD [facility] Pager [phone]  If 7PM-7AM, please contact night-coverage [email or web address] Password [email or web address] 07/24/2019, 11:53 AM

## 2019-07-25 LAB — CBC WITH DIFFERENTIAL/PLATELET
Abs Immature Granulocytes: 0.22 10*3/uL — ABNORMAL HIGH (ref 0.00–0.07)
Basophils Absolute: 0 10*3/uL (ref 0.0–0.1)
Basophils Relative: 0 %
Eosinophils Absolute: 0 10*3/uL (ref 0.0–0.5)
Eosinophils Relative: 0 %
HCT: 34.6 % — ABNORMAL LOW (ref 36.0–46.0)
Hemoglobin: 10.6 g/dL — ABNORMAL LOW (ref 12.0–15.0)
Immature Granulocytes: 2 %
Lymphocytes Relative: 9 %
Lymphs Abs: 1.1 10*3/uL (ref 0.7–4.0)
MCH: 27.5 pg (ref 26.0–34.0)
MCHC: 30.6 g/dL (ref 30.0–36.0)
MCV: 89.9 fL (ref 80.0–100.0)
Monocytes Absolute: 0.4 10*3/uL (ref 0.1–1.0)
Monocytes Relative: 3 %
Neutro Abs: 10.6 10*3/uL — ABNORMAL HIGH (ref 1.7–7.7)
Neutrophils Relative %: 86 %
Platelets: 531 10*3/uL — ABNORMAL HIGH (ref 150–400)
RBC: 3.85 MIL/uL — ABNORMAL LOW (ref 3.87–5.11)
RDW: 13.2 % (ref 11.5–15.5)
WBC: 12.4 10*3/uL — ABNORMAL HIGH (ref 4.0–10.5)
nRBC: 0 % (ref 0.0–0.2)

## 2019-07-25 LAB — BASIC METABOLIC PANEL
Anion gap: 12 (ref 5–15)
BUN: 33 mg/dL — ABNORMAL HIGH (ref 8–23)
CO2: 33 mmol/L — ABNORMAL HIGH (ref 22–32)
Calcium: 8.5 mg/dL — ABNORMAL LOW (ref 8.9–10.3)
Chloride: 87 mmol/L — ABNORMAL LOW (ref 98–111)
Creatinine, Ser: 1.26 mg/dL — ABNORMAL HIGH (ref 0.44–1.00)
GFR calc Af Amer: 44 mL/min — ABNORMAL LOW (ref >60)
GFR calc non Af Amer: 38 mL/min — ABNORMAL LOW (ref >60)
Glucose, Bld: 257 mg/dL — ABNORMAL HIGH (ref 70–99)
Potassium: 4.5 mmol/L (ref 3.5–5.1)
Sodium: 132 mmol/L — ABNORMAL LOW (ref 135–145)

## 2019-07-25 LAB — GLUCOSE, CAPILLARY
Glucose-Capillary: 247 mg/dL — ABNORMAL HIGH (ref 70–99)
Glucose-Capillary: 266 mg/dL — ABNORMAL HIGH (ref 70–99)
Glucose-Capillary: 306 mg/dL — ABNORMAL HIGH (ref 70–99)
Glucose-Capillary: 248 mg/dL — ABNORMAL HIGH (ref 70–99)

## 2019-07-25 NOTE — Progress Notes (Signed)
PROGRESS NOTE    Melissa [name]  WCH:[id number] DOB: 10-11-1928 DOA: 07/10/2019 PCP: [name], MD   Brief Narrative:  Patient is 85 year old female with history of chronic systolic CHF with ejection fraction of 20%, diabetes type 2, hypertension, A. fib on Eliquis, hyperlipidemia who presented to the emergency department with complaints of cough for 2 weeks.  She has history of recurrent pneumonia and chronic cough.  She follows with Dr. [name], pulmonology.  High-resolution CT scan in September 2019 showed bronchiectatic changes with scaring with concern for MAI versus COPD versus recurrent aspiration.    She was admitted for acute respiratory failure due to bronchopneumonia, possible exacerbation of bronchiectasis. COVID-19 test was negative. Chest x-ray on 07/13/2019 showed bilateral patchy opacities ,PCCM consulted on 07/14/2019.  Hospital course remarkable for development  of A. fib with RVR for which she was started on amiodarone and slow improvement in the respiratory status. There was plan for  bronchoscopy but she was very debilitated/weak and was thought to be unstable for the procedure so the plan is to continue antibiotics and monitor for improvement.   Assessment & Plan:   Principal Problem:   Bronchopneumonia Active Problems:   Type 2 diabetes mellitus with diabetic neuropathy, with long-term current use of insulin (HCC)   HLD (hyperlipidemia)   Left ventricular dysfunction   Hypothyroidism, acquired   Essential hypertension   Chronic kidney disease, stage III (moderate) (HCC)   Paroxysmal atrial fibrillation (HCC)   Chronic anticoagulation   Chronic systolic heart failure (HCC)   Dyspnea   Acute respiratory failure with hypoxia (HCC)   Lobar pneumonia (HCC)   Atrial fibrillation with RVR (HCC)   Acute respiratory failure with hypoxia: Suspected to have bronchopneumonia on presentation.  Likely progressive bronchiectasis.  Covid-19  negative.  Chest imaging showing  bilateral multiple patchy ill-defined opacities concerning for multifocal pneumonia.  Continues to require 3-4L of oxygen per minute.  Not on oxygen at home.  No history of COPD or asthma.  History of recurrent pneumonia in the past.  Continue bronchodilators.  Planned  bronchoscopy by PCCM but canceled because her respiratory status was not  stable.  Also started on  steroids.  Continue cefepime,Day 6/7 Lasix held  due to worsening kidney function but has been resumed  at 40 mg daily. She says she feels better today.  Looks better.  Paroxysmal A. fib: Currently in normal sinus rhythm.  Went into A. fib with RVR few days ago.  Cardiology was following.  Started on amiodarone.  On Eliquis.  On metoprolol for rate control  Chronic systolic CHF: Last echocardiogram on 2018 showed ejection fraction of 15 to 20%.  She follows with [name], cardiology.  Continue lasix 40 mg daily.  Diabetes type 2: Recent hemoglobin A1c 6.1.  Continue current insulin regimen  CKD stage III: Baseline creatinine of 1.2-1.5.  Near basline  Hyperlipidemia: Continue statin  Chronic back pain: Continue supportive care, lidocaine patch  Chronic normocytic anemia: Likely associated  with chronic disease.  Continue to monitor.  Currently stable  Hypothyroidism: Continue Synthyroid  Deconditioning/debility: She was seen by PT/OT and recommended SNF on discharge.Patient says she wants to go home.  Goals of care: Very elderly female with multiple comorbidities.  Respiratory status slow to improve.  If her overall status does not improve in the next few days, we may need to discuss about goals of care.  Which will most likely involve palliative care.         DVT prophylaxis:  Eliquis Code Status: Full Family Communication: daughter at the bed side on 07/21/19 Disposition Plan: SNF/HH  when respiratory status further  Improves,also needs PPCM clearance before DC  Consultants: PCCM  Procedures:None  Antimicrobials:   Anti-infectives (From admission, onward)   Start     Dose/Rate Route Frequency Ordered Stop   07/24/19 1230  vancomycin (VANCOCIN) IVPB 1000 mg/200 mL premix  Status:  Discontinued     1,000 mg 200 mL/hr over 60 Minutes Intravenous Every 48 hours 07/22/19 1049 07/23/19 0919   07/23/19 0919  vancomycin (VANCOCIN) 1,250 mg in sodium chloride 0.9 % 250 mL IVPB  Status:  Discontinued     1,250 mg 166.7 mL/hr over 90 Minutes Intravenous Every 48 hours 07/23/19 0919 07/24/19 1111   07/20/19 1030  ceFEPIme (MAXIPIME) 2 g in sodium chloride 0.9 % 100 mL IVPB     2 g 200 mL/hr over 30 Minutes Intravenous Every 24 hours 07/20/19 1026     07/20/19 1030  vancomycin (VANCOCIN) 1,250 mg in sodium chloride 0.9 % 250 mL IVPB  Status:  Discontinued     1,250 mg 166.7 mL/hr over 90 Minutes Intravenous Every 48 hours 07/20/19 1026 07/22/19 1049   07/15/19 2200  amoxicillin-clavulanate (AUGMENTIN) 500-125 MG per tablet 500 mg  Status:  Discontinued     1 tablet Oral 2 times daily 07/15/19 1612 07/19/19 1539   07/14/19 1800  amoxicillin-clavulanate (AUGMENTIN) 875-125 MG per tablet 1 tablet  Status:  Discontinued     1 tablet Oral Every 12 hours 07/14/19 1450 07/15/19 1612   07/11/19 1830  cefTRIAXone (ROCEPHIN) 1 g in sodium chloride 0.9 % 100 mL IVPB  Status:  Discontinued     1 g 200 mL/hr over 30 Minutes Intravenous Every 24 hours 07/10/19 1916 07/11/19 0813   07/11/19 1830  azithromycin (ZITHROMAX) 500 mg in sodium chloride 0.9 % 250 mL IVPB  Status:  Discontinued     500 mg 250 mL/hr over 60 Minutes Intravenous Every 24 hours 07/10/19 1916 07/14/19 1500   07/11/19 1100  piperacillin-tazobactam (ZOSYN) IVPB 3.375 g  Status:  Discontinued     3.375 g 12.5 mL/hr over 240 Minutes Intravenous Every 8 hours 07/11/19 1030 07/14/19 1450   07/11/19 0815  piperacillin-tazobactam (ZOSYN) IVPB 3.375 g  Status:  Discontinued     3.375 g 12.5 mL/hr over 240 Minutes Intravenous Every 8 hours 07/11/19 0813 07/11/19  1030   07/10/19 1800  cefTRIAXone (ROCEPHIN) 1 g in sodium chloride 0.9 % 100 mL IVPB     1 g 200 mL/hr over 30 Minutes Intravenous  Once 07/10/19 1749 07/10/19 1922   07/10/19 1800  azithromycin (ZITHROMAX) tablet 500 mg     500 mg Oral  Once 07/10/19 1749 07/10/19 1834      Subjective:  Patient seen and examined at the beside this morning.  She states she feels better today.  Cough is better.  Does not look dyspneic.  On 4 L of oxygen per minute.  Lungs sound better on auscultation.  Objective: Vitals:   07/25/19 0300 07/25/19 0749 07/25/19 0836 07/25/19 0844  BP: (!) 128/52 (!) 149/72    Pulse: 80 82    Resp: 16 20    Temp: 98.4 F (36.9 C) 98.1 F (36.7 C)    TempSrc: Oral Oral    SpO2: 95% 98% 97% 97%  Weight:      Height:        Intake/Output Summary (Last 24 hours) at 07/25/2019 1128 Last data filed  at 07/25/2019 0500 Gross per 24 hour  Intake -  Output 300 ml  Net -300 ml   Filed Weights   07/11/19 1415  Weight: 68.2 kg    Examination:  General exam: Pleasant elderly female, extremely deconditioned/debilitated HEENT:PERRL,Oral mucosa moist, Ear/Nose normal on gross exam Respiratory system: Improving air entry bilaterally, few crackles on the bases  cardiovascular system: S1 & S2 heard, RRR. No JVD, murmurs, rubs, gallops or clicks. Gastrointestinal system: Abdomen is nondistended, soft and nontender. No organomegaly or masses felt. Normal bowel sounds heard. Central nervous system: Alert and oriented. No focal neurological deficits. Extremities: No edema, no clubbing ,no cyanosis, distal peripheral pulses palpable. Skin: No rashes, lesions or ulcers,no icterus ,no pallor     Data Reviewed: I have personally reviewed following labs and imaging studies  CBC: Recent Labs  Lab 07/21/19 0338 07/22/19 0342 07/23/19 0434 07/24/19 0412 07/25/19 0610  WBC 12.9* 16.5* 12.8* 13.5* 12.4*  NEUTROABS 12.0* 15.1* 11.5* 11.9* 10.6*  HGB 10.7* 10.0* 10.8*  11.1* 10.6*  HCT 33.0* 30.9* 34.5* 35.4* 34.6*  MCV 88.9 88.5 90.1 90.1 89.9  PLT 462* 474* 577* 553* 678*   Basic Metabolic Panel: Recent Labs  Lab 07/20/19 0934 07/22/19 0342 07/23/19 0434 07/24/19 0412 07/24/19 1400 07/25/19 0610  NA 131* 132* 132* 131*  --  132*  K 4.8 5.3* 5.4* 6.2* 4.3 4.5  CL 83* 87* 85* 86*  --  87*  CO2 35* 37* 37* 36*  --  33*  GLUCOSE 190* 190* 249* 256*  --  257*  BUN 15 34* 35* 36*  --  33*  CREATININE 1.27* 1.53* 1.30* 1.27*  --  1.26*  CALCIUM 8.5* 8.1* 8.4* 8.7*  --  8.5*   GFR: Estimated Creatinine Clearance: 27.2 mL/min (A) (by C-G formula based on SCr of 1.26 mg/dL (H)). Liver Function Tests: No results for input(s): AST, ALT, ALKPHOS, BILITOT, PROT, ALBUMIN in the last 168 hours. No results for input(s): LIPASE, AMYLASE in the last 168 hours. No results for input(s): AMMONIA in the last 168 hours. Coagulation Profile: No results for input(s): INR, PROTIME in the last 168 hours. Cardiac Enzymes: No results for input(s): CKTOTAL, CKMB, CKMBINDEX, TROPONINI in the last 168 hours. BNP (last 3 results) No results for input(s): PROBNP in the last 8760 hours. HbA1C: No results for input(s): HGBA1C in the last 72 hours. CBG: Recent Labs  Lab 07/24/19 0717 07/24/19 1131 07/24/19 1645 07/24/19 2133 07/25/19 0754  GLUCAP 270* 160* 264* 245* 247*   Lipid Profile: No results for input(s): CHOL, HDL, LDLCALC, TRIG, CHOLHDL, LDLDIRECT in the last 72 hours. Thyroid Function Tests: No results for input(s): TSH, T4TOTAL, FREET4, T3FREE, THYROIDAB in the last 72 hours. Anemia Panel: No results for input(s): VITAMINB12, FOLATE, FERRITIN, TIBC, IRON, RETICCTPCT in the last 72 hours. Sepsis Labs: No results for input(s): PROCALCITON, LATICACIDVEN in the last 168 hours.  Recent Results (from the past 240 hour(s))  Acid Fast Smear (AFB)     Status: None   Collection Time: 07/20/19  4:44 AM   Specimen: Tracheal Aspirate; Sputum  Result Value Ref  Range Status   AFB Specimen Processing Concentration  Final   Acid Fast Smear Negative  Final    Comment: (NOTE) Performed At: [facility] [place], [place] [id number] Rush Farmer MD WC:[id number]    Source (AFB) SPUTUM  Final    Comment: Performed at [facility], [place] [address] [place], [place] [address]  MRSA PCR Screening  Status: None   Collection Time: 07/22/19  3:42 PM   Specimen: Nasopharyngeal  Result Value Ref Range Status   MRSA by PCR NEGATIVE NEGATIVE Final    Comment:        The GeneXpert MRSA Assay (FDA approved for NASAL specimens only), is one component of a comprehensive MRSA colonization surveillance program. It is not intended to diagnose MRSA infection nor to guide or monitor treatment for MRSA infections. Performed at [facility], [place] [address], [place], [place] [address]          Radiology Studies: No results found.      Scheduled Meds: . amiodarone  200 mg Oral BID   Followed by  . [START ON 07/30/2019] amiodarone  200 mg Oral Daily  . apixaban  5 mg Oral BID  . atorvastatin  40 mg Oral Daily  . cholecalciferol  1,000 Units Oral Daily  . FLUoxetine  20 mg Oral Daily  . furosemide  40 mg Oral Daily  . guaiFENesin-dextromethorphan  10 mL Oral Q6H  . influenza vaccine adjuvanted  0.5 mL Intramuscular Tomorrow-1000  . insulin aspart  0-5 Units Subcutaneous QHS  . insulin aspart  0-9 Units Subcutaneous TID WC  . insulin aspart  3 Units Subcutaneous TID WC  . insulin detemir  12 Units Subcutaneous QHS  . ipratropium  0.5 mg Nebulization TID  . levalbuterol  0.63 mg Nebulization TID  . levothyroxine  50 mcg Oral Daily  . lidocaine  1 patch Transdermal Q24H  . methylPREDNISolone (SOLU-MEDROL) injection  40 mg Intravenous Q12H  . metoprolol succinate  12.5 mg Oral Daily  . multivitamin  1 tablet Oral BID  . multivitamins with iron  1 tablet Oral Daily  . omega-3 acid ethyl esters  1,000 mg Oral  Daily  . pantoprazole  40 mg Oral Daily  . pramipexole  0.75 mg Oral QHS  . pregabalin  50 mg Oral TID  . traZODone  100 mg Oral QHS  . umeclidinium bromide  1 puff Inhalation Daily   Continuous Infusions: . ceFEPime (MAXIPIME) IV 2 g (07/25/19 0912)     LOS: 15 days    Time spent:35 mins. More than 50% of that time was spent in counseling and/or coordination of care.      Shelly Coss, MD [facility] Pager [phone]  If 7PM-7AM, please contact night-coverage [email or web address] Password [email or web address] 07/25/2019, 11:28 AM

## 2019-07-25 NOTE — TOC Progression Note (Signed)
Transition of Care [facility]) - Progression Note    Patient Details  Name: Melissa [name] MRN: [id number] Date of Birth: 22-Dec-1927  Transition of Care [facility]) CM/SW [place], [place] Phone Number: 07/25/2019, 1:41 PM  Clinical Narrative:     CSW met with the patient at bedside. She stated she was feeling better today. CSW shared updated therapy recommendation. The patient declined going to rehab. She is already set up with [facility]. She will need home health orders closer to discharge.   CSW will continue to follow and assist with disposition planning.   Expected Discharge Plan: [place] Barriers to Discharge: Continued Medical Work up  Expected Discharge Plan and Services Expected Discharge Plan: [place] In-house Referral: Clinical Social Work   Post Acute Care Choice: [place] arrangements for the past 2 months: [place]: PT, OT [place]: [place] Date [place]: 07/16/19 Time [place]: 1732 Representative spoke with at [place]: [place] ([place]) Interventions    Readmission Risk Interventions No flowsheet data found.

## 2019-07-26 LAB — GLUCOSE, CAPILLARY
Glucose-Capillary: 296 mg/dL — ABNORMAL HIGH (ref 70–99)
Glucose-Capillary: 301 mg/dL — ABNORMAL HIGH (ref 70–99)
Glucose-Capillary: 348 mg/dL — ABNORMAL HIGH (ref 70–99)
Glucose-Capillary: 259 mg/dL — ABNORMAL HIGH (ref 70–99)

## 2019-07-26 MED ORDER — IPRATROPIUM BROMIDE 0.02 % IN SOLN
0.5000 mg | Freq: Two times a day (BID) | RESPIRATORY_TRACT | Status: DC
  Administered 2019-07-26 – 2019-07-27 (×2): 0.5 mg via RESPIRATORY_TRACT
  Filled 2019-07-26 (×2): qty 2.5

## 2019-07-26 MED ORDER — LEVALBUTEROL HCL 0.63 MG/3ML IN NEBU
0.6300 mg | INHALATION_SOLUTION | Freq: Two times a day (BID) | RESPIRATORY_TRACT | Status: DC
  Administered 2019-07-26 – 2019-07-27 (×2): 0.63 mg via RESPIRATORY_TRACT
  Filled 2019-07-26 (×2): qty 3

## 2019-07-26 MED ORDER — METHYLPREDNISOLONE SODIUM SUCC 40 MG IJ SOLR
40.0000 mg | Freq: Every day | INTRAMUSCULAR | Status: DC
  Administered 2019-07-27: 40 mg via INTRAVENOUS
  Filled 2019-07-26: qty 1

## 2019-07-26 NOTE — Progress Notes (Signed)
Physical Therapy Treatment Patient Details Name: Melissa [name] MRN: [id number] DOB: [birth year] Today's Date: 07/26/2019    History of Present Illness Pt adm with acute hypoxic respiratory failure due to bronchopneumonia. PMH - htn, dm, afib, chf, ckd, asthma, arthritis,     PT Comments    Focus of session was stair training for discharge home tomorrow. Pt up in recliner willing to work with therapy. Given pt's decreased endurance, single step brought to room and RW placed over it to be used as handrails to replicate home environment.  Pt on 4L O2 via [place] at rest with SaO2 100%O2.  Supplemental O2 increased to 6L for mobility. Pt requires min guard for sit>stand transfer, HHA to walk from recliner to step, and mod progressing to min A for 2 bouts of 4 ascent/descent of 1 step. Pt was able to maintain SaO2 >92%O2 throughout stair training. Pt and daughter educated in monitoring SaO2 with mobility to ensure appropriate level of oxygenation. Since pt is discharging home tomorrow, PT recommend HHPT and pt and daughter are in agreement.    Follow Up Recommendations  Home health PT;Supervision/Assistance - 24 hour     Equipment Recommendations  None recommended by PT       Precautions / Restrictions Precautions Precautions: Fall;Other (comment) Precaution Comments: watch SpO2 Restrictions Weight Bearing Restrictions: No    Mobility  Bed Mobility               General bed mobility comments: sitting in recliner  Transfers Overall transfer level: Needs assistance Equipment used: Rolling walker (2 wheeled) Transfers: Sit to/from Stand Sit to Stand: Min guard         General transfer comment: min guard for power up and steadying  Ambulation/Gait Ambulation/Gait assistance: Min assist Gait Distance (Feet): 5 Feet Assistive device: 1 person hand held assist Gait Pattern/deviations: Decreased stride length Gait velocity: decreased Gait velocity interpretation: <1.31 ft/sec,  indicative of household ambulator General Gait Details: HHA to walk from recliner to RW over practice step   Stairs Stairs: Yes Stairs assistance: Min assist;Mod assist Stair Management: Two rails;Step to pattern;Backwards;Forwards Number of Stairs: 1(2 bouts of 4 step up/down) General stair comments: due to O2 desaturation with ambulation, brought 1 practice step into room, pt able to practice stepping up and back from step in 2 bouts of 4 to mimic entry to her home, pt progressed from [name] to [place], educated daughter on use of gait belt to steady with ascending stairs into her home   Wheelchair Mobility    Modified Rankin (Stroke Patients Only)       Balance Overall balance assessment: Needs assistance Sitting-balance support: Feet supported Sitting balance-Leahy Scale: Fair     Standing balance support: Bilateral upper extremity supported;During functional activity Standing balance-Leahy Scale: Poor Standing balance comment: reliant on UE support                            Cognition Arousal/Alertness: Awake/alert Behavior During Therapy: WFL for tasks assessed/performed Overall Cognitive Status: Within Functional Limits for tasks assessed                                           General Comments General comments (skin integrity, edema, etc.): Pt's daughter is present, and participates in stair training. Pt on 4 L O2 via  sitting in recliner with  SaO2 100%O2, pt placed on 6L O2 via [place] for stair training due to historic drop in SaO2 with mobility, pt able to maintain SaO2 >92%O2 with stair training      Pertinent Vitals/Pain Pain Assessment: No/denies pain           PT Goals (current goals can now be found in the care plan section) Acute Rehab PT Goals Patient Stated Goal: to go home tomorrow PT Goal Formulation: With patient Time For Goal Achievement: 07/27/19 Potential to Achieve Goals: Fair Progress towards PT goals: Progressing  toward goals    Frequency    Min 3X/week      PT Plan Discharge plan needs to be updated       AM-PAC PT "6 Clicks" Mobility   Outcome Measure  Help needed turning from your back to your side while in a flat bed without using bedrails?: None Help needed moving from lying on your back to sitting on the side of a flat bed without using bedrails?: None Help needed moving to and from a bed to a chair (including a wheelchair)?: A Little Help needed standing up from a chair using your arms (e.g., wheelchair or bedside chair)?: A Little Help needed to walk in hospital room?: A Little Help needed climbing 3-5 steps with a railing? : A Lot 6 Click Score: 19    End of Session Equipment Utilized During Treatment: Oxygen;Gait belt Activity Tolerance: Patient tolerated treatment well Patient left: in chair;with call bell/phone within reach;with chair alarm set;with family/visitor present Nurse Communication: Mobility status PT Visit Diagnosis: Unsteadiness on feet (R26.81);Muscle weakness (generalized) (M62.81)     Time: 6004-5997 PT Time Calculation (min) (ACUTE ONLY): 16 min  Charges:  $Gait Training: 8-22 mins                     Talya Quain B. Migdalia Dk PT, DPT Acute Rehabilitation Services Pager [phone] Office [phone]    [place] 07/26/2019, 5:50 PM

## 2019-07-26 NOTE — Progress Notes (Signed)
Daily Nursing Note  Report received from [place], [place]. Patient assessment in NAD. Gotten OOB in afternoon. Patient reports that she would like to discharge to home. Informed her that we need to get an SPO2 saturation on room air. This was completed, patient will need home O2, coordination completed with case manager. Blood sugars high today in lieu of IV steroids. Worked with PT/OT. Patient is planned for discharge tomorrow. Patients daughter updated at bedside.

## 2019-07-26 NOTE — Progress Notes (Signed)
Inpatient Diabetes Program Recommendations  AACE/ADA: New Consensus Statement on Inpatient Glycemic Control (2015)  Target Ranges:  Prepandial:   less than 140 mg/dL      Peak postprandial:   less than 180 mg/dL (1-2 hours)      Critically ill patients:  140 - 180 mg/dL   Lab Results  Component Value Date   GLUCAP 296 (H) 07/26/2019   HGBA1C 6.1 (A) 05/05/2019    Review of Glycemic Control  Blood sugars in past 24H - 247-306 mg/dL Needs insulin adjustment. Continues with Prednisone 40 mg Q12H.  Inpatient Diabetes Program Recommendations:     Increase Levemir to 14 units QHS Increase Novolog to 4 units tidwc for meal coverage insulin.  Will continue to follow. Watch for steroid tapering.  Thank you. Lorenda Peck, RD, LDN, CDE Inpatient Diabetes Coordinator [phone]

## 2019-07-26 NOTE — TOC Progression Note (Addendum)
Transition of Care [facility]) - Progression Note    Patient Details  Name: Melissa [name] MRN: [id number] Date of Birth: 1927-12-18  Transition of Care [facility]) CM/SW Contact  Maryclare Labrador, RN Phone Number: 07/26/2019, 2:18 PM  Clinical Narrative:   Pt will discharge home on oxygen and will benefit from an afflovent vest for secretion managment - attending in agreement. CM offered pt DME agency choice - pt chose [employer] - agency contacted and referral accepted.   Afflov[employer] order form placed on shadow chart for attending to complete.  Pt informed CM that her daughter will transport her home and will help her at discharge. Alvis Lemmings has accepted pt for [facility]  Tentative discharge is for 10/13  PT last note states SNF - CM text paged therapist to inform that pt will discharge home instead of SNF and will need a home discharge evaluation.      Expected Discharge Plan: [place] Barriers to Discharge: Continued Medical Work up  Expected Discharge Plan and Services Expected Discharge Plan: [place] In-house Referral: Clinical Social Work   Post Acute Care Choice: [place] arrangements for the past 2 months: Single Family Home                 DME Arranged: Oxygen DME Agency: [employer] Date DME Agency Contacted: 07/26/19 Time DME Agency Contacted: [id number] Representative spoke with at DME Agency: [place]: PT, OT [place] Agency: [place] Date [place]: 07/16/19 Time [place]: 1732 Representative spoke with at [place]: [place] ([place]) Interventions    Readmission Risk Interventions No flowsheet data found.

## 2019-07-26 NOTE — Progress Notes (Signed)
Occupational Therapy Treatment Patient Details Name: Melissa [name] MRN: [id number] DOB: Jun 29, 1928 Today's Date: 07/26/2019    History of present illness Pt adm with acute hypoxic respiratory failure due to bronchopneumonia. PMH - htn, dm, afib, chf, ckd, asthma, arthritis,    OT comments  Upon arrival pt sitting in recliner on 4lnc, pt agreeable to OT session. Pt currently requires minA for ADL and functional mobility at RW level. Pt ambulated 42ft on 4lnc, SpO2 dropped from 95% to 73% requiring 6lnc and 47min seated rest break with pursed lip breathing to return O2 to 95%. Pt ambulated back to room on 6lnc, SpO2 maintained >92%. Pt educated on energy conservation strategies with provided handout. Pt will continue to benefit from skilled OT services to maximize safety and independence with ADL/IADL and functional mobility. Will continue to follow acutely and progress as tolerated.    Follow Up Recommendations  Home health OT;SNF;Supervision/Assistance - 24 hour    Equipment Recommendations  None recommended by OT    Recommendations for Other Services      Precautions / Restrictions Precautions Precautions: Fall;Other (comment) Precaution Comments: watch SpO2 Restrictions Weight Bearing Restrictions: No       Mobility Bed Mobility               General bed mobility comments: sitting in recliner  Transfers Overall transfer level: Needs assistance Equipment used: Rolling walker (2 wheeled) Transfers: Sit to/from Stand Sit to Stand: Min assist         General transfer comment: minA for powerup    Balance Overall balance assessment: Needs assistance Sitting-balance support: Feet supported Sitting balance-Leahy Scale: Fair     Standing balance support: Bilateral upper extremity supported;During functional activity Standing balance-Leahy Scale: Poor Standing balance comment: reliant on UE support                           ADL either performed or  assessed with clinical judgement   ADL Overall ADL's : Needs assistance/impaired                     Lower Body Dressing: Minimal assistance;Sit to/from stand   Toilet Transfer: Minimal Insurance claims handler Details (indicate cue type and reason): simulated in room Toileting- Clothing Manipulation and Hygiene: Minimal assistance;Cueing for safety;Sit to/from stand Toileting - Clothing Manipulation Details (indicate cue type and reason): cueing for safe hand placement     Functional mobility during ADLs: Minimal assistance;Rolling walker General ADL Comments: decreased endurance and activity tolerance;SOB with mobility;pt on 4lnc at rest, desat to 73% with 71feet ambulation;required 6lnc to return O2 to 95% with pursed lip breathing and seated rest break;educated pt on energy conservation strategies with provided handout     Vision   Vision Assessment?: No apparent visual deficits   Perception     Praxis      Cognition Arousal/Alertness: Awake/alert Behavior During Therapy: WFL for tasks assessed/performed Overall Cognitive Status: Within Functional Limits for tasks assessed                                          Exercises     Shoulder Instructions       General Comments Pt's daughter present during session    Pertinent Vitals/ Pain       Pain Assessment: No/denies pain  Home Living  Prior Functioning/Environment              Frequency  Min 2X/week        Progress Toward Goals  OT Goals(current goals can now be found in the care plan section)  Progress towards OT goals: Progressing toward goals  Acute Rehab OT Goals Patient Stated Goal: to go home tomorrow OT Goal Formulation: With patient Time For Goal Achievement: 08/09/19 Potential to Achieve Goals: Good ADL Goals Pt Will Perform Grooming: with modified independence;sitting;standing Pt Will Perform  Upper Body Bathing: with modified independence;sitting Pt Will Perform Lower Body Bathing: with modified independence;sit to/from stand Pt Will Perform Upper Body Dressing: with modified independence;sitting Pt Will Perform Lower Body Dressing: with modified independence;sit to/from stand Pt Will Transfer to Toilet: with modified independence;ambulating Pt Will Perform Toileting - Clothing Manipulation and hygiene: with modified independence;sit to/from stand Pt Will Perform Tub/Shower Transfer: with modified independence;ambulating;rolling walker  Plan Discharge plan remains appropriate    Co-evaluation                 AM-PAC OT "6 Clicks" Daily Activity     Outcome Measure   Help from another person eating meals?: None Help from another person taking care of personal grooming?: A Little Help from another person toileting, which includes using toliet, bedpan, or urinal?: A Little Help from another person bathing (including washing, rinsing, drying)?: A Little Help from another person to put on and taking off regular upper body clothing?: A Little Help from another person to put on and taking off regular lower body clothing?: A Little 6 Click Score: 19    End of Session Equipment Utilized During Treatment: Gait belt;Rolling walker;Oxygen  OT Visit Diagnosis: Unsteadiness on feet (R26.81);Muscle weakness (generalized) (M62.81)   Activity Tolerance Patient tolerated treatment well   Patient Left in chair;with call bell/phone within reach;with chair alarm set;with nursing/sitter in room;with family/visitor present   Nurse Communication Mobility status        Time: 9753-0051 OT Time Calculation (min): 30 min  Charges: OT General Charges $OT Visit: 1 Visit OT Treatments $Self Care/Home Management : 23-37 mins  Dorinda Hill OTR/L Acute Rehabilitation Services Office: [place] 07/26/2019, 3:17 PM

## 2019-07-26 NOTE — Progress Notes (Signed)
NAME:  Melissa [name], MRN:  [id number], DOB:  1928-02-25, LOS: 32 ADMISSION DATE:  07/10/2019, CONSULTATION DATE:   REFERRING MD: Doristine Bosworth , CHIEF COMPLAINT: New hypoxic respiratory failure  Brief History   85 year old female patient with multiple medical comorbidities most importantly including history of bronchiectasis (felt either NTM or aspiration related), mild obstructive lung disease with FEV1 69% with 5% improvement following bronchodilators and systolic cardiomyopathy with EF 20%.  Admitted 9/26 with new hypoxic respiratory failure and working diagnosis of community-acquired pneumonia versus aspiration pulmonary.  PCCM consulted 9/30 for ongoing supplemental oxygen needs.  10/6 due to continued hypoxia empiric antibiotics ans steroids were resumed, autoimmune and vasculitis profile as well as ERS were sent.   Past Medical History  Recurrent pneumonia Chronic cough Bronchiectasis felt possibly either secondary to MAI or chronic aspiration Dysphasia is identified by modified barium swallow August 2019 Mild obstructive disease with FEV1 69%, improved to 74% with bronchodilators by spirometry September 8768 Chronic systolic heart failure with ejection fraction of 20% Hypertension Atrial fibrillation on Eliquis Diabetes  Significant Hospital Events   9/26 admitted. Started on oxygen azith and rocephin 9/27 feeling a little better but still short of breath with activity still having cough 9/28: Antibiotics broadened, placed on Zosyn instead of Rocephin to cover for possible aspiration 9/29: Still short of breath down to 3 L nasal cannula working with physical therapy, 9/30: Pulmonary consulted as patient still oxygen dependent. 10/5 still on 5-6 liters CXR not much improved. abx stopped.  10/6 cxr worse. No sig fever spike. CBC about the same.  10/8 - No significant events reported overnight. She is sitting up in bedside recliner, reports improvement in dyspnea and cough compared to  yesterday  Consults:  Pulmonary consult on 9/30  Procedures:    Significant Diagnostic Tests:  CT chest 10/6 >> Severe multilobar bronchopneumonia, no definite findings to indicate underlying interstitial lung disease. Small bilateral pleural effusions, aortic atherosclerosis. Hepatic steatosis     Micro Data:  COVID 9/26 >> neg Sputum culture 9/27 >> Normal flora Respiratory viral panel 9/30 >> eg Urine strep antigen 9/30 >> neg Urine Legionella antigen 9/30 >> neg Induced sputum for afb 10/6 >> neg   Antimicrobials:  azithromycin 9/26 >> Rocephin 9/26 >>9/27 Zosyn 9/27>>>9/30 augmentin 9/20>>>10/5 Cefepime 10/6 >>  Vancomycin 10/6 >>  Interim history/subjective:  Breathing about the same.  Remains on 4L HFNC  Objective   Blood pressure 129/81, pulse 82, temperature 98.1 F (36.7 C), temperature source Oral, resp. rate 17, height '5\' 5"'$  (1.651 m), weight 68.2 kg, SpO2 98 %.        Intake/Output Summary (Last 24 hours) at 07/26/2019 1102 Last data filed at 07/26/2019 1000 Gross per 24 hour  Intake 1040 ml  Output 1000 ml  Net 40 ml   Filed Weights   07/11/19 1415  Weight: 68.2 kg   Physical Exam: General: Elderly female, resting in bed, in NAD. Neuro: A&O x 3, no deficits. HEENT: [place]/AT. Sclerae anicteric. EOMI. Cardiovascular: RRR, no M/R/G.  Lungs: Respirations even and unlabored.  CTA bilaterally, No W/R/R. Abdomen: BS x 4, soft, NT/ND.  Musculoskeletal: No gross deformities, no edema.  Skin: Intact, warm, no rashes.  Assessment & Plan:   Acute on chronic hypoxic respiratory failure in the setting of bilateral patchy pulmonary infiltrates with known history of bronchiectasis and mild obstructive lung disease w/ + BD response.  Autoimmune workup; ESR > 97, ANA > neg, ANCA > neg, GBM > neg,  Anti-DNA >  neg, RF > 21.9 and nearly neg, CCP > neg, SSA > neg, SSB > neg,  Anti-SCL > neg -Differential diagnosis includes: aspiration PNA vs BTX flare vs atypical  infection (we have been concerned about MAI) also suspect that there is an element of edema  -abx originally stopped 10/5; wbc still trending down; no fever spikes -HRCT read as severe multilobar bronchopneumonia, PCT and RVP negative  P: Continue supplemental oxygen and BD Ambulatory desaturation study ordered to prove home O2 needs Continue diuresing as tolerated Encourage frequent pulmonary hygiene habits including flutter valve and IS use  Continue empiric antibiotics - total  7 days Continue steroids, drop from 40 BID to QD then transition to PO with slow taper Push PT / mobilization efforts F/u as outpatient (appointment already scheduled 08/02/19 with Dr. Loanne Drilling).  Systolic CM (EF 77%) w/ h/o PAF; Had recurrent RVR 9/29 -currently NSR w/ last QTc 488 P: Continuous telemetry  Further diuretics per primary  Continue Amiodarone with slow taper and beta blocker Not a good candidate for long term Amiodarone use given underlying lung disease  Continue DOAC   Montey Hora, PA - [facility] [place] Pager: [phone] - [phone].  If no answer, [phone] 07/26/2019, 11:08 AM

## 2019-07-26 NOTE — Progress Notes (Signed)
SATURATION QUALIFICATIONS: (This note is used to comply with regulatory documentation for home oxygen)  Patient Saturations on Room Air at Rest = 89%  Patient Saturations on Room Air while Ambulating = 84%  Patient Saturations on 3Liters of oxygen while Ambulating = 89-91%  Please briefly explain why patient needs home oxygen: CHF EF of 20%

## 2019-07-26 NOTE — Progress Notes (Addendum)
PROGRESS NOTE    Melissa [name]  TXM:[id number] DOB: 1928/05/19 DOA: 07/10/2019 PCP: [name], MD   Brief Narrative:  Patient is 85 year old female with history of chronic systolic CHF with ejection fraction of 20%, diabetes type 2, hypertension, A. fib on Eliquis, hyperlipidemia who presented to the emergency department with complaints of cough for 2 weeks.  She has history of recurrent pneumonia and chronic cough.  She follows with Dr. [name], pulmonology.  High-resolution CT scan in September 2019 showed bronchiectatic changes with scaring with concern for MAI versus COPD versus recurrent aspiration.    She was admitted for acute respiratory failure due to bronchopneumonia, possible exacerbation of bronchiectasis. COVID-19 test was negative. Chest x-ray on 07/13/2019 showed bilateral patchy opacities ,PCCM consulted on 07/14/2019.  Hospital course remarkable for development  of A. fib with RVR for which she was started on amiodarone and slow improvement in the respiratory status. There was plan for  bronchoscopy but she was very debilitated/weak and was thought to be unstable for the procedure so the plan is to continue antibiotics and monitor for improvement.  Patient's respiratory status has gradually improved.  She feels better today.  If she continues to improve, plan for discharge to home tomorrow on home oxygen.  Assessment & Plan:   Principal Problem:   Bronchopneumonia Active Problems:   Type 2 diabetes mellitus with diabetic neuropathy, with long-term current use of insulin (HCC)   HLD (hyperlipidemia)   Left ventricular dysfunction   Hypothyroidism, acquired   Essential hypertension   Chronic kidney disease, stage III (moderate) (HCC)   Paroxysmal atrial fibrillation (HCC)   Chronic anticoagulation   Chronic systolic heart failure (HCC)   Dyspnea   Acute respiratory failure with hypoxia (HCC)   Lobar pneumonia (HCC)   Atrial fibrillation with RVR (HCC)   Acute respiratory  failure with hypoxia: Suspected to have bronchopneumonia on presentation.  Likely progressive bronchiectasis.  Covid-19  negative.  Chest imaging showing bilateral multiple patchy ill-defined opacities concerning for multifocal pneumonia.  Continues to require 3-4L of oxygen per minute.  Not on oxygen at home.  No history of COPD or asthma.  History of recurrent pneumonia in the past.  Continue bronchodilators.  Planned  bronchoscopy by PCCM but canceled because her respiratory status was not  stable.  Started on  Steroids which we will taper.she completed antibiotics course.  Lasix held  due to worsening kidney function but has been resumed  at 40 mg daily. She says she feels better today.  Looks better.  Chronic respiratory insufficiency: Secondary to progressive bronchiectasis.  Qualified for home oxygen. The patient has bronchiectasis and has tried and failed other standard treatments such as a flutter valve and an incentive spirometer to mobilize secretions. Patient would benefit from HFCWO (High Frequency Chest Wall Oscillation) or the AffloVest  Paroxysmal A. fib: Currently in normal sinus rhythm.  Went into A. fib with RVR few days ago.  Cardiology was following.  Started on amiodarone.  On Eliquis.  On metoprolol for rate control  Chronic systolic CHF: Last echocardiogram on 2018 showed ejection fraction of 15 to 20%.  She follows with [name], cardiology.  Continue lasix 40 mg daily.  Diabetes type 2: Recent hemoglobin A1c 6.1.  Continue current insulin regimen  CKD stage III: Baseline creatinine of 1.2-1.5.  Near basline  Hyperlipidemia: Continue statin  Chronic back pain: Continue supportive care, lidocaine patch  Chronic normocytic anemia: Likely associated  with chronic disease.  Continue to monitor.  Currently  stable  Hypothyroidism: Continue Synthyroid  Deconditioning/debility: She was seen by PT/OT and recommended SNF on discharge.Patient says she wants to go home.  Goals  of care: Very elderly female with multiple comorbidities.  Remains full code.        DVT prophylaxis: Eliquis Code Status: Full Family Communication: daughter at the bed side on 07/21/19.will call again today Disposition Plan: Home with home health tomorow  Consultants: PCCM  Procedures:None  Antimicrobials:  Anti-infectives (From admission, onward)   Start     Dose/Rate Route Frequency Ordered Stop   07/24/19 1230  vancomycin (VANCOCIN) IVPB 1000 mg/200 mL premix  Status:  Discontinued     1,000 mg 200 mL/hr over 60 Minutes Intravenous Every 48 hours 07/22/19 1049 07/23/19 0919   07/23/19 0919  vancomycin (VANCOCIN) 1,250 mg in sodium chloride 0.9 % 250 mL IVPB  Status:  Discontinued     1,250 mg 166.7 mL/hr over 90 Minutes Intravenous Every 48 hours 07/23/19 0919 07/24/19 1111   07/20/19 1030  ceFEPIme (MAXIPIME) 2 g in sodium chloride 0.9 % 100 mL IVPB  Status:  Discontinued     2 g 200 mL/hr over 30 Minutes Intravenous Every 24 hours 07/20/19 1026 07/26/19 1157   07/20/19 1030  vancomycin (VANCOCIN) 1,250 mg in sodium chloride 0.9 % 250 mL IVPB  Status:  Discontinued     1,250 mg 166.7 mL/hr over 90 Minutes Intravenous Every 48 hours 07/20/19 1026 07/22/19 1049   07/15/19 2200  amoxicillin-clavulanate (AUGMENTIN) 500-125 MG per tablet 500 mg  Status:  Discontinued     1 tablet Oral 2 times daily 07/15/19 1612 07/19/19 1539   07/14/19 1800  amoxicillin-clavulanate (AUGMENTIN) 875-125 MG per tablet 1 tablet  Status:  Discontinued     1 tablet Oral Every 12 hours 07/14/19 1450 07/15/19 1612   07/11/19 1830  cefTRIAXone (ROCEPHIN) 1 g in sodium chloride 0.9 % 100 mL IVPB  Status:  Discontinued     1 g 200 mL/hr over 30 Minutes Intravenous Every 24 hours 07/10/19 1916 07/11/19 0813   07/11/19 1830  azithromycin (ZITHROMAX) 500 mg in sodium chloride 0.9 % 250 mL IVPB  Status:  Discontinued     500 mg 250 mL/hr over 60 Minutes Intravenous Every 24 hours 07/10/19 1916 07/14/19 1500    07/11/19 1100  piperacillin-tazobactam (ZOSYN) IVPB 3.375 g  Status:  Discontinued     3.375 g 12.5 mL/hr over 240 Minutes Intravenous Every 8 hours 07/11/19 1030 07/14/19 1450   07/11/19 0815  piperacillin-tazobactam (ZOSYN) IVPB 3.375 g  Status:  Discontinued     3.375 g 12.5 mL/hr over 240 Minutes Intravenous Every 8 hours 07/11/19 0813 07/11/19 1030   07/10/19 1800  cefTRIAXone (ROCEPHIN) 1 g in sodium chloride 0.9 % 100 mL IVPB     1 g 200 mL/hr over 30 Minutes Intravenous  Once 07/10/19 1749 07/10/19 1922   07/10/19 1800  azithromycin (ZITHROMAX) tablet 500 mg     500 mg Oral  Once 07/10/19 1749 07/10/19 1834      Subjective:  Patient seen and examined the bedside this morning.  Hemodynamically stable.  She looks better today.  Feels comfortable.  She is eager to go home.  Currently on 4 L of oxygen per minute.  Cough is better.  Objective: Vitals:   07/26/19 0400 07/26/19 0500 07/26/19 0600 07/26/19 0736  BP: (!) 139/54  129/81   Pulse:      Resp: 17 18 17    Temp:  TempSrc:      SpO2: 96% (!) 89% 94% 98%  Weight:      Height:        Intake/Output Summary (Last 24 hours) at 07/26/2019 1159 Last data filed at 07/26/2019 1000 Gross per 24 hour  Intake 1040 ml  Output 1000 ml  Net 40 ml   Filed Weights   07/11/19 1415  Weight: 68.2 kg    Examination:  General exam: Pleasant elderly female, extremely deconditioned/debilitated HEENT:PERRL,Oral mucosa moist, Ear/Nose normal on gross exam Respiratory system: Improved air entry bilaterally, minimal  crackles on the bases  cardiovascular system: S1 & S2 heard, RRR. No JVD, murmurs, rubs, gallops or clicks. Gastrointestinal system: Abdomen is nondistended, soft and nontender. No organomegaly or masses felt. Normal bowel sounds heard. Central nervous system: Alert and oriented. No focal neurological deficits. Extremities: No edema, no clubbing ,no cyanosis, distal peripheral pulses palpable. Skin: No rashes,  lesions or ulcers,no icterus ,no pallor     Data Reviewed: I have personally reviewed following labs and imaging studies  CBC: Recent Labs  Lab 07/21/19 0338 07/22/19 0342 07/23/19 0434 07/24/19 0412 07/25/19 0610  WBC 12.9* 16.5* 12.8* 13.5* 12.4*  NEUTROABS 12.0* 15.1* 11.5* 11.9* 10.6*  HGB 10.7* 10.0* 10.8* 11.1* 10.6*  HCT 33.0* 30.9* 34.5* 35.4* 34.6*  MCV 88.9 88.5 90.1 90.1 89.9  PLT 462* 474* 577* 553* 831*   Basic Metabolic Panel: Recent Labs  Lab 07/20/19 0934 07/22/19 0342 07/23/19 0434 07/24/19 0412 07/24/19 1400 07/25/19 0610  NA 131* 132* 132* 131*  --  132*  K 4.8 5.3* 5.4* 6.2* 4.3 4.5  CL 83* 87* 85* 86*  --  87*  CO2 35* 37* 37* 36*  --  33*  GLUCOSE 190* 190* 249* 256*  --  257*  BUN 15 34* 35* 36*  --  33*  CREATININE 1.27* 1.53* 1.30* 1.27*  --  1.26*  CALCIUM 8.5* 8.1* 8.4* 8.7*  --  8.5*   GFR: Estimated Creatinine Clearance: 27.2 mL/min (A) (by C-G formula based on SCr of 1.26 mg/dL (H)). Liver Function Tests: No results for input(s): AST, ALT, ALKPHOS, BILITOT, PROT, ALBUMIN in the last 168 hours. No results for input(s): LIPASE, AMYLASE in the last 168 hours. No results for input(s): AMMONIA in the last 168 hours. Coagulation Profile: No results for input(s): INR, PROTIME in the last 168 hours. Cardiac Enzymes: No results for input(s): CKTOTAL, CKMB, CKMBINDEX, TROPONINI in the last 168 hours. BNP (last 3 results) No results for input(s): PROBNP in the last 8760 hours. HbA1C: No results for input(s): HGBA1C in the last 72 hours. CBG: Recent Labs  Lab 07/25/19 0754 07/25/19 1137 07/25/19 1549 07/25/19 2122 07/26/19 0809  GLUCAP 247* 266* 306* 248* 296*   Lipid Profile: No results for input(s): CHOL, HDL, LDLCALC, TRIG, CHOLHDL, LDLDIRECT in the last 72 hours. Thyroid Function Tests: No results for input(s): TSH, T4TOTAL, FREET4, T3FREE, THYROIDAB in the last 72 hours. Anemia Panel: No results for input(s): VITAMINB12,  FOLATE, FERRITIN, TIBC, IRON, RETICCTPCT in the last 72 hours. Sepsis Labs: No results for input(s): PROCALCITON, LATICACIDVEN in the last 168 hours.  Recent Results (from the past 240 hour(s))  Acid Fast Smear (AFB)     Status: None   Collection Time: 07/20/19  4:44 AM   Specimen: Tracheal Aspirate; Sputum  Result Value Ref Range Status   AFB Specimen Processing Concentration  Final   Acid Fast Smear Negative  Final    Comment: (NOTE) Performed At: [facility]  [place], [place] [id number] Rush Farmer MD HL:[id number]    Source (AFB) SPUTUM  Final    Comment: Performed at [facility], [place] [address], [place], [place] [address]  MRSA PCR Screening     Status: None   Collection Time: 07/22/19  3:42 PM   Specimen: Nasopharyngeal  Result Value Ref Range Status   MRSA by PCR NEGATIVE NEGATIVE Final    Comment:        The GeneXpert MRSA Assay (FDA approved for NASAL specimens only), is one component of a comprehensive MRSA colonization surveillance program. It is not intended to diagnose MRSA infection nor to guide or monitor treatment for MRSA infections. Performed at [facility], [place] [address] [place], [place] [address]          Radiology Studies: No results found.      Scheduled Meds: . amiodarone  200 mg Oral BID   Followed by  . [START ON 07/30/2019] amiodarone  200 mg Oral Daily  . apixaban  5 mg Oral BID  . atorvastatin  40 mg Oral Daily  . cholecalciferol  1,000 Units Oral Daily  . FLUoxetine  20 mg Oral Daily  . furosemide  40 mg Oral Daily  . guaiFENesin-dextromethorphan  10 mL Oral Q6H  . influenza vaccine adjuvanted  0.5 mL Intramuscular Tomorrow-1000  . insulin aspart  0-5 Units Subcutaneous QHS  . insulin aspart  0-9 Units Subcutaneous TID WC  . insulin aspart  3 Units Subcutaneous TID WC  . insulin detemir  12 Units Subcutaneous QHS  . ipratropium  0.5 mg Nebulization BID  . levalbuterol  0.63 mg  Nebulization BID  . levothyroxine  50 mcg Oral Daily  . lidocaine  1 patch Transdermal Q24H  . [START ON 07/27/2019] methylPREDNISolone (SOLU-MEDROL) injection  40 mg Intravenous Daily  . metoprolol succinate  12.5 mg Oral Daily  . multivitamin  1 tablet Oral BID  . multivitamins with iron  1 tablet Oral Daily  . omega-3 acid ethyl esters  1,000 mg Oral Daily  . pantoprazole  40 mg Oral Daily  . pramipexole  0.75 mg Oral QHS  . pregabalin  50 mg Oral TID  . traZODone  100 mg Oral QHS  . umeclidinium bromide  1 puff Inhalation Daily   Continuous Infusions:    LOS: 16 days    Time spent:35 mins. More than 50% of that time was spent in counseling and/or coordination of care.      Shelly Coss, MD [facility] Pager [phone]  If 7PM-7AM, please contact night-coverage [email or web address] Password [email or web address] 07/26/2019, 11:59 AM

## 2019-07-27 ENCOUNTER — Inpatient Hospital Stay (HOSPITAL_COMMUNITY): Payer: Medicare Other

## 2019-07-27 LAB — CBC WITH DIFFERENTIAL/PLATELET
Abs Immature Granulocytes: 0.44 10*3/uL — ABNORMAL HIGH (ref 0.00–0.07)
Basophils Absolute: 0 10*3/uL (ref 0.0–0.1)
Basophils Relative: 0 %
Eosinophils Absolute: 0.1 10*3/uL (ref 0.0–0.5)
Eosinophils Relative: 1 %
HCT: 34.9 % — ABNORMAL LOW (ref 36.0–46.0)
Hemoglobin: 11.3 g/dL — ABNORMAL LOW (ref 12.0–15.0)
Immature Granulocytes: 3 %
Lymphocytes Relative: 16 %
Lymphs Abs: 2.2 10*3/uL (ref 0.7–4.0)
MCH: 28.5 pg (ref 26.0–34.0)
MCHC: 32.4 g/dL (ref 30.0–36.0)
MCV: 87.9 fL (ref 80.0–100.0)
Monocytes Absolute: 1.2 10*3/uL — ABNORMAL HIGH (ref 0.1–1.0)
Monocytes Relative: 8 %
Neutro Abs: 10 10*3/uL — ABNORMAL HIGH (ref 1.7–7.7)
Neutrophils Relative %: 72 %
Platelets: 426 10*3/uL — ABNORMAL HIGH (ref 150–400)
RBC: 3.97 MIL/uL (ref 3.87–5.11)
RDW: 13.2 % (ref 11.5–15.5)
WBC: 14 10*3/uL — ABNORMAL HIGH (ref 4.0–10.5)
nRBC: 0 % (ref 0.0–0.2)

## 2019-07-27 LAB — BASIC METABOLIC PANEL
Anion gap: 11 (ref 5–15)
BUN: 33 mg/dL — ABNORMAL HIGH (ref 8–23)
CO2: 34 mmol/L — ABNORMAL HIGH (ref 22–32)
Calcium: 8.3 mg/dL — ABNORMAL LOW (ref 8.9–10.3)
Chloride: 87 mmol/L — ABNORMAL LOW (ref 98–111)
Creatinine, Ser: 1.54 mg/dL — ABNORMAL HIGH (ref 0.44–1.00)
GFR calc Af Amer: 34 mL/min — ABNORMAL LOW (ref >60)
GFR calc non Af Amer: 30 mL/min — ABNORMAL LOW (ref >60)
Glucose, Bld: 151 mg/dL — ABNORMAL HIGH (ref 70–99)
Potassium: 3.9 mmol/L (ref 3.5–5.1)
Sodium: 132 mmol/L — ABNORMAL LOW (ref 135–145)

## 2019-07-27 LAB — GLUCOSE, CAPILLARY
Glucose-Capillary: 129 mg/dL — ABNORMAL HIGH (ref 70–99)
Glucose-Capillary: 246 mg/dL — ABNORMAL HIGH (ref 70–99)

## 2019-07-27 MED ORDER — AMIODARONE HCL 200 MG PO TABS: 200.0000 mg | ORAL_TABLET | Freq: Every day | ORAL | 0 refills | Status: DC

## 2019-07-27 MED ORDER — LEVALBUTEROL HCL 0.63 MG/3ML IN NEBU: 0.6300 mg | INHALATION_SOLUTION | Freq: Two times a day (BID) | RESPIRATORY_TRACT | 12 refills | Status: DC

## 2019-07-27 MED ORDER — METOPROLOL SUCCINATE ER 25 MG PO TB24: 25.0000 mg | ORAL_TABLET | Freq: Every day | ORAL | Status: DC

## 2019-07-27 MED ORDER — METOPROLOL TARTRATE 5 MG/5ML IV SOLN
5.0000 mg | Freq: Once | INTRAVENOUS | Status: AC
  Administered 2019-07-27: 5 mg via INTRAVENOUS
  Filled 2019-07-27: qty 5

## 2019-07-27 MED ORDER — GUAIFENESIN-DM 100-10 MG/5ML PO SYRP: 10.0000 mL | ORAL_SOLUTION | Freq: Four times a day (QID) | ORAL | 0 refills | Status: DC

## 2019-07-27 MED ORDER — INFLUENZA VAC A&B SA ADJ QUAD 0.5 ML IM PRSY: 0.5000 mL | PREFILLED_SYRINGE | INTRAMUSCULAR | 0 refills | Status: AC

## 2019-07-27 MED ORDER — METHYLPREDNISOLONE 4 MG PO TABS: 4.0000 mg | ORAL_TABLET | Freq: Every day | ORAL | 0 refills | Status: AC

## 2019-07-27 MED ORDER — PRAMIPEXOLE DIHYDROCHLORIDE 0.75 MG PO TABS: 0.7500 mg | ORAL_TABLET | Freq: Every day | ORAL | 1 refills | Status: DC

## 2019-07-27 MED ORDER — IPRATROPIUM BROMIDE 0.02 % IN SOLN: 0.5000 mg | Freq: Two times a day (BID) | RESPIRATORY_TRACT | 12 refills | Status: DC

## 2019-07-27 MED ORDER — LIDOCAINE 5 % EX PTCH: 1.0000 | MEDICATED_PATCH | CUTANEOUS | 0 refills | Status: DC

## 2019-07-27 MED ORDER — FUROSEMIDE 40 MG PO TABS: 40.0000 mg | ORAL_TABLET | Freq: Every day | ORAL | Status: DC

## 2019-07-27 MED ORDER — LEVALBUTEROL HCL 1.25 MG/0.5ML IN NEBU: 1.2500 mg | INHALATION_SOLUTION | RESPIRATORY_TRACT | 12 refills | Status: DC | PRN

## 2019-07-27 MED ORDER — METOPROLOL SUCCINATE ER 25 MG PO TB24: 25.0000 mg | ORAL_TABLET | Freq: Every day | ORAL | 0 refills | Status: DC

## 2019-07-27 MED ORDER — UMECLIDINIUM BROMIDE 62.5 MCG/INH IN AEPB: 1.0000 | INHALATION_SPRAY | Freq: Every day | RESPIRATORY_TRACT | 0 refills | Status: DC

## 2019-07-27 MED ORDER — FUROSEMIDE 40 MG PO TABS: 40.0000 mg | ORAL_TABLET | Freq: Every day | ORAL | 1 refills | Status: DC

## 2019-07-27 MED ORDER — METOPROLOL SUCCINATE ER 25 MG PO TB24: 12.5000 mg | ORAL_TABLET | Freq: Every day | ORAL | 1 refills | Status: DC

## 2019-07-27 NOTE — Progress Notes (Signed)
Occupational Therapy Treatment Patient Details Name: Melissa [name] MRN: [id number] DOB: 09/09/1928 Today's Date: 07/27/2019    History of present illness Pt adm with acute hypoxic respiratory failure due to bronchopneumonia. PMH - htn, dm, afib, chf, ckd, asthma, arthritis,    OT comments  Upon arrival, pt slumped toward foot of bed, SpO2 92% on 6lnc. Pt progressed to EOB with minA and completed simple bathing task while sitting EOB, taking frequent rest breaks and educated on energy conservation strategies. During bathing, SpO2 dropped to 89%, required pursed lip breathing and rest break to return to 92%. Pt required minA for sit<>stand and stand-pivot transfer. Upon return to supine, pt began coughing up red tinted sputum, reported feeling nauseous and O2 85%-87% on 4lnc, required 5ln to return O2 >90%. RN aware of pts status throughout session. Pt will continue to benefit from skilled OT services to maximize safety and independence with ADL/IADL and functional mobility. Will continue to follow acutely and progress as tolerated.    Follow Up Recommendations  Home health OT;SNF;Supervision/Assistance - 24 hour    Equipment Recommendations  3 in 1 bedside commode    Recommendations for Other Services      Precautions / Restrictions Precautions Precautions: Fall;Other (comment) Precaution Comments: watch SpO2 Restrictions Weight Bearing Restrictions: No       Mobility Bed Mobility Overal bed mobility: Needs Assistance Bed Mobility: Supine to Sit;Sit to Supine     Supine to sit: Min assist Sit to supine: Min assist      Transfers Overall transfer level: Needs assistance Equipment used: 1 person hand held assist Transfers: Sit to/from [facility] Sit to Stand: Min assist Stand pivot transfers: Min assist       General transfer comment: minA for steadying, sit<>stand from EOB;stand pivot to walk toward [facility]    Balance Overall balance assessment: Needs  assistance Sitting-balance support: Feet supported Sitting balance-Leahy Scale: Fair Sitting balance - Comments: able to static sit   Standing balance support: Bilateral upper extremity supported;During functional activity Standing balance-Leahy Scale: Poor Standing balance comment: reliant on UE support                           ADL either performed or assessed with clinical judgement   ADL Overall ADL's : Needs assistance/impaired         Upper Body Bathing: Sitting;Min guard Upper Body Bathing Details (indicate cue type and reason): completed sitting bed level Lower Body Bathing: Min guard;Sitting/lateral leans Lower Body Bathing Details (indicate cue type and reason): completed sitting bed level;minguard for safety          Toilet Transfer: Minimal assistance;RW Toilet Transfer Details (indicate cue type and reason): simulated in room         Functional mobility during ADLs: Minimal assistance;Rolling walker General ADL Comments: limited activity tolerance this session;pt desat during grooming;required frequent rest breaks during bathing, SpO2 94%-89% 4lnc;educated pt on energy conservation strategies;following ADL pt began coughing and reported she was nauseous, coughed up red tinted sputum;RN aware;pt required 5lnc to maintain O2 >90% RN aware     Vision       Perception     Praxis      Cognition Arousal/Alertness: Awake/alert Behavior During Therapy: WFL for tasks assessed/performed Overall Cognitive Status: Within Functional Limits for tasks assessed  Exercises     Shoulder Instructions       General Comments pt coughed up red tinted sputum    Pertinent Vitals/ Pain       Pain Assessment: Faces Faces Pain Scale: Hurts little more Pain Location: chest Pain Descriptors / Indicators: Sore;Heaviness Pain Intervention(s): Limited activity within patient's tolerance;Monitored during  session  Home Living                                          Prior Functioning/Environment              Frequency  Min 2X/week        Progress Toward Goals  OT Goals(current goals can now be found in the care plan section)  Progress towards OT goals: Not progressing toward goals - comment(decreased activity tolerance, desat at rest)  Acute Rehab OT Goals Patient Stated Goal: to go home OT Goal Formulation: With patient Time For Goal Achievement: 08/09/19 Potential to Achieve Goals: Good ADL Goals Pt Will Perform Grooming: with modified independence;sitting;standing Pt Will Perform Upper Body Bathing: with modified independence;sitting Pt Will Perform Lower Body Bathing: with modified independence;sit to/from stand Pt Will Perform Upper Body Dressing: with modified independence;sitting Pt Will Perform Lower Body Dressing: with modified independence;sit to/from stand Pt Will Transfer to Toilet: with modified independence;ambulating Pt Will Perform Toileting - Clothing Manipulation and hygiene: with modified independence;sit to/from stand Pt Will Perform Tub/Shower Transfer: with modified independence;ambulating;rolling walker  Plan Discharge plan remains appropriate    Co-evaluation                 AM-PAC OT "6 Clicks" Daily Activity     Outcome Measure   Help from another person eating meals?: None Help from another person taking care of personal grooming?: A Little Help from another person toileting, which includes using toliet, bedpan, or urinal?: A Little Help from another person bathing (including washing, rinsing, drying)?: A Little Help from another person to put on and taking off regular upper body clothing?: A Little Help from another person to put on and taking off regular lower body clothing?: A Little 6 Click Score: 19    End of Session Equipment Utilized During Treatment: Gait belt;Oxygen  OT Visit Diagnosis: Unsteadiness on  feet (R26.81);Muscle weakness (generalized) (M62.81)   Activity Tolerance Patient tolerated treatment well   Patient Left in bed;with call bell/phone within reach;with bed alarm set   Nurse Communication Mobility status(O2 level and coughing up sputum)        Time: 1023-1040 OT Time Calculation (min): 17 min  Charges: OT General Charges $OT Visit: 1 Visit OT Treatments $Self Care/Home Management : 8-22 mins  Dorinda Hill OTR/L [place] Office: [place] 07/27/2019, 10:48 AM

## 2019-07-27 NOTE — Discharge Summary (Signed)
Physician Discharge Summary [facility]    Patient: Melissa [name]                   Admit date: 07/10/2019   DOB: April 21, 1928             Discharge date:07/27/2019/9:21 AM YTK:[id number]                          PCP: [name], MD  Disposition: Home with home health  Recommendations for Outpatient Follow-up:    Follow up: in 1 week  Discharge Condition: Stable   Code Status:   Code Status: Full Code  Diet recommendation: Regular healthy diet   Discharge Diagnoses:    Principal Problem:   Bronchopneumonia Active Problems:   Type 2 diabetes mellitus with diabetic neuropathy, with long-term current use of insulin ([place])   HLD (hyperlipidemia)   Left ventricular dysfunction   Hypothyroidism, acquired   Essential hypertension   Chronic kidney disease, stage III (moderate) (HCC)   Paroxysmal atrial fibrillation (HCC)   Chronic anticoagulation   Chronic systolic heart failure ([place])   Dyspnea   Acute respiratory failure with hypoxia ([place])   Lobar pneumonia (HCC)   Atrial fibrillation with RVR ([place])   History of Present Illness/ Hospital Course Melissa [name] Summary:  Patient is 85 year old female with history of chronic systolic CHF with ejection fraction of 20%, diabetes type 2, hypertension, A. fib on Eliquis, hyperlipidemia who presented to the emergency department with complaints of cough for 2 weeks.  She has history of recurrent pneumonia and chronic cough.  She follows with Dr. [name], pulmonology.  High-resolution CT scan in September 2019 showed bronchiectatic changes with scaring with concern for MAI versus COPD versus recurrent aspiration.    She was admitted for acute respiratory failure due to bronchopneumonia, possible exacerbation of bronchiectasis. COVID-19 test was negative. Chest x-ray on 07/13/2019 showed bilateral patchy opacities ,PCCM consulted on 07/14/2019.  Hospital course remarkable for development  of A. fib with RVR for which she was started on  amiodarone and slow improvement in the respiratory status. There was plan for  bronchoscopy but she was very debilitated/weak and was thought to be unstable for the procedure so the plan is to continue antibiotics and monitor for improvement.  Patient's respiratory status has gradually improved.  She feels better today.  If she continues to improve, plan for discharge to home tomorrow on home oxygen.  Discharge summary/Assessment & Plan:     Acute respiratory failure with hypoxia:  -Stable, still requiring 4 L of oxygen to maintain O2 sat greater than 90%, home O2 has been arranged Suspected to have bronchopneumonia on presentation.  Likely progressive bronchiectasis.  Covid-19  negative.  Chest imaging showing bilateral multiple patchy ill-defined opacities concerning for multifocal pneumonia.  Continues to require 3-4L of oxygen per minute.  Not on oxygen at home.  No history of COPD or asthma.  History of recurrent pneumonia in the past.  Continue bronchodilators.  Planned  bronchoscopy by PCCM but canceled because her respiratory status was not  stable.  Started on  Steroids which we will taper.she completed antibiotics course.   Lasix held  due to worsening kidney function but has been resumed  at 40 mg daily.   Chronic respiratory insufficiency:  -Continue home O2, follow-up with pulmonologist as an outpatient Secondary to progressive bronchiectasis.  Qualified for home oxygen. The patient has bronchiectasis and has tried and failed other standard treatments such  as a flutter valve and an incentive spirometer to mobilize secretions. Patient would benefit from HFCWO (High Frequency Chest Wall Oscillation) or the AffloVest  Paroxysmal A. fib:  Rate controlled, currently in normal sinus rhythm.  Went into A. fib with RVR few days ago   Cardiology was following.  Started on amiodarone.  On Eliquis.  On metoprolol for rate control Amiodarone has been reduced to 200 mg p.o. daily needs to be  tapered off as an outpatient  Chronic systolic CHF: Last echocardiogram on 2018 showed ejection fraction of 15 to 20%.   She follows with [name], cardiology.  Continue lasix 40 mg daily. Also Amiodarone needs to be monitored by cardiology and tapered off  Diabetes type 2:  Recent hemoglobin A1c 6.1.   Continue home regimen  CKD stage III:  -Avoid nephrotoxins Baseline creatinine of 1.2-1.5.  Near basline  Hyperlipidemia: Continue statin Chronic back pain: Continue supportive carel, PRN analgesics over-the-counter Lidoderm patch may be utilized -with PT OT  Chronic normocytic anemia:  Likely associated  with chronic disease.  Currently stable  Hypothyroidism: Continue Synthyroid  Deconditioning/debility:  -Status post PT/OT evaluation, recommending SNF rehab, patient refused, therefore home health has been arranged .  Goals of care: Very elderly female with multiple comorbidities.  Remains full code.   Code Status: Full Family Communication: daughter  Disposition Plan: Home with home health Consultants: PCCM   Discharge Instructions:   Discharge Instructions    (HEART FAILURE PATIENTS) Call MD:  Anytime you have any of the following symptoms: 1) 3 pound weight gain in 24 hours or 5 pounds in 1 week 2) shortness of breath, with or without a dry hacking cough 3) swelling in the hands, feet or stomach 4) if you have to sleep on extra pillows at night in order to breathe.   Complete by: As directed    Activity as tolerated - No restrictions   Complete by: As directed    Diet - low sodium heart healthy   Complete by: As directed    Discharge instructions   Complete by: As directed    Per instruction, HH , F/up with lung doctor   Increase activity slowly   Complete by: As directed    Increase activity slowly   Complete by: As directed        Medication List    STOP taking these medications   azithromycin 250 MG tablet Commonly known as: ZITHROMAX     Cephalexin 250 MG tablet   levalbuterol 1.25 MG/3ML nebulizer solution Commonly known as: XOPENEX Replaced by: levalbuterol 0.63 MG/3ML nebulizer solution You also have another medication with the same name that you need to continue taking as instructed.   pregabalin 50 MG capsule Commonly known as: LYRICA   Tussin 100 MG/5ML syrup Generic drug: guaifenesin     TAKE these medications   acetaminophen 500 MG tablet Commonly known as: TYLENOL Take 1,000 mg by mouth every 6 (six) hours as needed for mild pain. Reported on 11/02/2015   amiodarone 200 MG tablet Commonly known as: PACERONE Take 1 tablet (200 mg total) by mouth daily. Start taking on: July 30, 2019   atorvastatin 40 MG tablet Commonly known as: LIPITOR Take 40 mg by mouth daily.   Eliquis 5 MG Tabs tablet Generic drug: apixaban TAKE 1 TABLET TWICE A DAY (DOSE INCREASE) What changed: See the new instructions.   Fish Oil 1000 MG Caps Take 1,000 mg by mouth daily.   FLUoxetine 20 MG capsule Commonly  known as: PROZAC Take 20 mg by mouth daily.   furosemide 40 MG tablet Commonly known as: LASIX Take 1 tablet (40 mg total) by mouth daily. Start taking on: July 28, 2019 What changed:   medication strength  how much to take  additional instructions   guaiFENesin-dextromethorphan 100-10 MG/5ML syrup Commonly known as: ROBITUSSIN DM Take 10 mLs by mouth every 6 (six) hours.   ICaps Areds 2 Caps Take 1 capsule by mouth 2 (two) times daily.   influenza vaccine adjuvanted 0.5 ML injection Commonly known as: FLUAD Inject 0.5 mLs into the muscle tomorrow at 10 am for 1 dose.   Insulin Pen Needle 32G X 4 MM Misc Commonly known as: BD Pen Needle Nano U/F Use 5 per day to inject insulin and victoza   Integra 62.5-62.5-40-3 MG Caps Take 1 capsule by mouth daily.   ipratropium 0.02 % nebulizer solution Commonly known as: ATROVENT Take 2.5 mLs (0.5 mg total) by nebulization 2 (two) times daily.    levalbuterol 1.25 MG/0.5ML nebulizer solution Commonly known as: XOPENEX Take 1.25 mg by nebulization every 4 (four) hours as needed for wheezing or shortness of breath. What changed:   additional instructions  Another medication with the same name was removed. Continue taking this medication, and follow the directions you see here.   levalbuterol 0.63 MG/3ML nebulizer solution Commonly known as: XOPENEX Take 3 mLs (0.63 mg total) by nebulization 2 (two) times daily. What changed: You were already taking a medication with the same name, and this prescription was added. Make sure you understand how and when to take each. Replaces: levalbuterol 1.25 MG/3ML nebulizer solution   Levemir FlexTouch 100 UNIT/ML Pen Generic drug: Insulin Detemir INJECT 34 UNITS            SUBCUTANEOUSLY AT BEDTIME What changed: See the new instructions.   levothyroxine 50 MCG tablet Commonly known as: SYNTHROID TAKE 1 TABLET BY MOUTH EVERY DAY What changed: when to take this   lidocaine 5 % Commonly known as: LIDODERM Place 1 patch onto the skin daily. Remove & Discard patch within 12 hours or as directed by MD   methylPREDNISolone 4 MG tablet Commonly known as: Medrol Take 1 tablet (4 mg total) by mouth daily for 5 days. Please dispense dose pack, use instructions for taper   metoprolol succinate 25 MG 24 hr tablet Commonly known as: TOPROL-XL Take 0.5 tablets (12.5 mg total) by mouth daily. What changed:   how much to take  how to take this  when to take this  additional instructions   ondansetron 4 MG tablet Commonly known as: Zofran Take 1 tablet (4 mg total) by mouth every 8 (eight) hours as needed for nausea or vomiting.   OneTouch Delica Lancets 09F Misc Use to check blood sugar 4 times per day dx code E11.29   OneTouch Verio test strip Generic drug: glucose blood USE AS INSTRUCTED TO CHECK BLOOD SUGAR 4 TIMES PER DAY   Ozempic (0.25 or 0.5 MG/DOSE) 2 MG/1.5ML Sopn Generic  drug: Semaglutide(0.25 or 0.5MG /DOS) Inject 0.25 mg into the skin once a week. Inject 0.25mg  under the skin once weekly for 4 weeks and then increase to 0.5mg  once weekly. What changed:   how much to take  when to take this  additional instructions   pantoprazole 40 MG tablet Commonly known as: PROTONIX Take 40 mg by mouth daily after lunch.   pramipexole 0.75 MG tablet Commonly known as: MIRAPEX Take 1 tablet (0.75 mg total) by  mouth at bedtime. What changed:   medication strength  how much to take   traZODone 100 MG tablet Commonly known as: DESYREL Take 100 mg by mouth at bedtime.   umeclidinium bromide 62.5 MCG/INH Aepb Commonly known as: INCRUSE ELLIPTA Inhale 1 puff into the lungs daily.   Vitamin D3 25 MCG (1000 UT) Caps Take 1,000 Units by mouth daily.      Follow-up Information    Margaretha Seeds, MD Follow up on 08/02/2019.   Specialty: Pulmonary Disease Why: at 11am Contact information: [place] [address] [phone]        [place] Follow up.   Why: Home oxygen and Afflovent         No Known Allergies   Procedures /Studies:   Dg Chest 2 View  Result Date: 07/13/2019 CLINICAL DATA:  Shortness of breath for 2 weeks. Recent diagnosis of pneumonia. EXAM: CHEST - 2 VIEW COMPARISON:  PA and lateral chest 07/10/2019. FINDINGS: Patchy bilateral airspace disease has worsened, particularly in the left lung base. There is a small left pleural effusion. Heart size is normal. No pneumothorax. Aortic atherosclerosis is noted. IMPRESSION: Worsened patchy bilateral airspace disease most consistent with pneumonia. Atypical/viral infection is possible. New small left pleural effusion. Atherosclerosis. Electronically Signed   By: Inge Rise M.D.   On: 07/13/2019 15:01   Dg Chest 2 View  Result Date: 07/10/2019 CLINICAL DATA:  Shortness of breath. EXAM: CHEST - 2 VIEW COMPARISON:  Radiograph of Feb 27, 2018. CT scan  of March 27, 2018 and July 08, 2018. FINDINGS: The heart size and mediastinal contours are within normal limits. No pneumothorax or pleural effusion is noted. There are multiple ill-defined opacities throughout both lungs, some of which were present on prior exam and some of which are new. The visualized skeletal structures are unremarkable. IMPRESSION: Multiple ill-defined opacities are noted throughout both lungs which are consistent with recurrent bronchopneumonia as described on prior CT scan. Aortic Atherosclerosis (ICD10-I70.0). Electronically Signed   By: Marijo Conception M.D.   On: 07/10/2019 15:37   Ct Chest High Resolution  Result Date: 07/20/2019 CLINICAL DATA:  85 year old female with history of shortness of breath. Evaluate for interstitial lung disease. EXAM: CT CHEST WITHOUT CONTRAST TECHNIQUE: Multidetector CT imaging of the chest was performed following the standard protocol without intravenous contrast. High resolution imaging of the lungs, as well as inspiratory and expiratory imaging, was performed. COMPARISON:  High-resolution chest CT 07/08/2018. FINDINGS: Cardiovascular: Heart size is normal. There is no significant pericardial fluid, thickening or pericardial calcification. There is aortic atherosclerosis, as well as atherosclerosis of the great vessels of the mediastinum and the coronary arteries, including calcified atherosclerotic plaque in the left main, left anterior descending, left circumflex and right coronary arteries. Mediastinum/Nodes: Multiple prominent borderline enlarged mediastinal and bilateral hilar lymph nodes are noted, nonspecific and likely reactive. Esophagus is unremarkable in appearance. No axillary lymphadenopathy. Lungs/Pleura: Patchy areas of peribronchovascular predominant ground-glass attenuation, consolidation and septal thickening noted throughout all aspects of the lungs bilaterally, most compatible with active bronchopneumonia. In the less affected  regions of the lungs there is no significant septal thickening, subpleural reticulation, traction bronchiectasis or frank honeycombing to clearly indicate underlying interstitial lung disease. Inspiratory and expiratory imaging is unremarkable. Small bilateral pleural effusions lying dependently with some associated passive subsegmental atelectasis in the lower lobes of the lungs bilaterally. Upper Abdomen: Mild diffuse low attenuation throughout the visualized hepatic parenchyma, indicative of hepatic steatosis. Status  post cholecystectomy. Musculoskeletal: There are no aggressive appearing lytic or blastic lesions noted in the visualized portions of the skeleton. IMPRESSION: 1. Findings in the lungs are compatible with severe multilobar bronchopneumonia. No definite imaging findings to indicate underlying interstitial lung disease. 2. Small bilateral pleural effusions. 3. Aortic atherosclerosis, in addition to left main and 3 vessel coronary artery disease. 4. Hepatic steatosis. Aortic Atherosclerosis (ICD10-I70.0). Electronically Signed   By: Vinnie Langton M.D.   On: 07/20/2019 10:00   Dg Chest Port 1 View  Result Date: 07/20/2019 CLINICAL DATA:  Cough and shortness-of-breath. EXAM: PORTABLE CHEST 1 VIEW COMPARISON:  07/19/2019 FINDINGS: Lungs are somewhat hypoinflated demonstrate no significant change in patchy bilateral airspace process. No definite effusion. Cardiomediastinal silhouette and remainder of the exam is unchanged. IMPRESSION: Stable patchy bilateral airspace process likely multifocal pneumonia. Electronically Signed   By: Marin Olp M.D.   On: 07/20/2019 10:06   Dg Chest Port 1 View  Result Date: 07/19/2019 CLINICAL DATA:  Shortness of breath, pneumonia. EXAM: PORTABLE CHEST 1 VIEW COMPARISON:  Radiograph of July 16, 2019. FINDINGS: Stable cardiomediastinal silhouette. Stable bilateral lung opacities are noted consistent with multifocal pneumonia. Small pleural effusions may be  present. Bony thorax is unremarkable. No pneumothorax is noted. IMPRESSION: Stable bilateral lung opacities are noted consistent with multifocal pneumonia. Electronically Signed   By: Marijo Conception M.D.   On: 07/19/2019 08:41   Dg Chest Port 1 View  Result Date: 07/16/2019 CLINICAL DATA:  : Pneumonia ,cough EXAM: PORTABLE CHEST 1 VIEW COMPARISON:  Chest radiograph 07/15/2019 FINDINGS: Stable cardiomediastinal contours with mildly enlarged heart size. Persistent bilateral diffuse patchy airspace opacities suspicious for infection. There is mildly increased fullness of the left hilum compared to more remote prior studies. No pneumothorax or large pleural effusion. No acute finding in the visualized skeleton. Surgical hardware noted in the right proximal humerus. IMPRESSION: 1. Persistent bilateral patchy airspace opacities suspicious for infection. 2. Slightly increased fullness of the left hilum compared to more remote prior studies, possibly representing underlying adenopathy. Recommend attention on follow-up. Electronically Signed   By: Audie Pinto M.D.   On: 07/16/2019 09:56   Dg Chest Port 1 View  Result Date: 07/15/2019 CLINICAL DATA:  Pneumonia. EXAM: PORTABLE CHEST 1 VIEW COMPARISON:  07/13/2019. FINDINGS: Mediastinum and hilar structures normal. Heart size stable. Patchy multifocal bilateral pulmonary infiltrates are again noted. These have progressed slightly from prior exam. Small bilateral pleural effusions. No pneumothorax. Right humerus plate and screw fixation. IMPRESSION: Interim slight progression of patchy multifocal bilateral pulmonary infiltrates. Small bilateral pleural effusions. Electronically Signed   By: Marcello Moores  Register   On: 07/15/2019 09:02    Subjective:   Patient was seen and examined 07/27/2019, 9:21 AM Patient stable today. No acute distress.  No issues overnight Stable for discharge.  Discharge Exam:    Vitals:   07/26/19 2028 07/26/19 2246 07/27/19 0245  07/27/19 0734  BP:  (!) 129/39 100/90 132/70  Pulse:  81 72   Resp:   18   Temp:  (!) 97.4 F (36.3 C) 98.5 F (36.9 C) 98.7 F (37.1 C)  TempSrc:  Oral Oral Axillary  SpO2: 93% 94% 98%   Weight:      Height:        General: Pt lying comfortably in bed & appears in no obvious distress. Cardiovascular: S1 & S2 heard, RRR, S1/S2 +. No murmurs, rubs, gallops or clicks. No JVD or pedal edema. Respiratory: Clear to auscultation without wheezing, rhonchi or crackles.  No increased work of breathing. Abdominal:  Non-distended, non-tender & soft. No organomegaly or masses appreciated. Normal bowel sounds heard. CNS: Alert and oriented. No focal deficits. Extremities: no edema, no cyanosis    The results of significant diagnostics from this hospitalization (including imaging, microbiology, ancillary and laboratory) are listed below for reference.      Microbiology:   Recent Results (from the past 240 hour(s))  Acid Fast Smear (AFB)     Status: None   Collection Time: 07/20/19  4:44 AM   Specimen: Tracheal Aspirate; Sputum  Result Value Ref Range Status   AFB Specimen Processing Concentration  Final   Acid Fast Smear Negative  Final    Comment: (NOTE) Performed At: [facility] [place], [place] [id number] Rush Farmer MD FG:[id number]    Source (AFB) SPUTUM  Final    Comment: Performed at [facility], [place] [address] [place], [place] [address]  MRSA PCR Screening     Status: None   Collection Time: 07/22/19  3:42 PM   Specimen: Nasopharyngeal  Result Value Ref Range Status   MRSA by PCR NEGATIVE NEGATIVE Final    Comment:        The GeneXpert MRSA Assay (FDA approved for NASAL specimens only), is one component of a comprehensive MRSA colonization surveillance program. It is not intended to diagnose MRSA infection nor to guide or monitor treatment for MRSA infections. Performed at [facility], [place] [address] [place], [place]  [address]      Labs:   CBC: Recent Labs  Lab 07/22/19 [phone] 07/23/19 0434 07/24/19 0412 07/25/19 0610 07/27/19 0436  WBC 16.5* 12.8* 13.5* 12.4* 14.0*  NEUTROABS 15.1* 11.5* 11.9* 10.6* 10.0*  HGB 10.0* 10.8* 11.1* 10.6* 11.3*  HCT 30.9* 34.5* 35.4* 34.6* 34.9*  MCV 88.5 90.1 90.1 89.9 87.9  PLT 474* 577* 553* 531* 585*   Basic Metabolic Panel: Recent Labs  Lab 07/22/19 0342 07/23/19 0434 07/24/19 0412 07/24/19 1400 07/25/19 0610 07/27/19 0436  NA 132* 132* 131*  --  132* 132*  K 5.3* 5.4* 6.2* 4.3 4.5 3.9  CL 87* 85* 86*  --  87* 87*  CO2 37* 37* 36*  --  33* 34*  GLUCOSE 190* 249* 256*  --  257* 151*  BUN 34* 35* 36*  --  33* 33*  CREATININE 1.53* 1.30* 1.27*  --  1.26* 1.54*  CALCIUM 8.1* 8.4* 8.7*  --  8.5* 8.3*   Liver Function Tests: No results for input(s): AST, ALT, ALKPHOS, BILITOT, PROT, ALBUMIN in the last 168 hours. BNP (last 3 results) Recent Labs    07/10/19 1503 07/19/19 0820  BNP 234.4* 96.9   Cardiac Enzymes: No results for input(s): CKTOTAL, CKMB, CKMBINDEX, TROPONINI in the last 168 hours. CBG: Recent Labs  Lab 07/26/19 0809 07/26/19 1231 07/26/19 1609 07/26/19 2049 07/27/19 0733  GLUCAP 296* 301* 348* 259* 129*  Urinalysis    Component Value Date/Time   COLORURINE YELLOW 10/19/2016 2036   APPEARANCEUR CLEAR 10/19/2016 2036   LABSPEC 1.030 10/19/2016 2036   PHURINE 6.0 10/19/2016 2036   GLUCOSEU NEGATIVE 10/19/2016 2036   GLUCOSEU NEGATIVE 01/12/2015 0922   HGBUR NEGATIVE 10/19/2016 2036   BILIRUBINUR NEGATIVE 10/19/2016 2036   BILIRUBINUR negative 05/10/2016 1314   [place] 10/19/2016 2036   PROTEINUR NEGATIVE 10/19/2016 2036   UROBILINOGEN 0.2 05/10/2016 1314   UROBILINOGEN 0.2 07/10/2015 2301   NITRITE NEGATIVE 10/19/2016 2036   LEUKOCYTESUR NEGATIVE 10/19/2016 2036  Time coordinating discharge: 55 minutes  SIGNED: Deatra James, MD, FACP, [facility]. [facility],  Pager [phone]  If 7PM-7AM,  please contact night-coverage [email or web address] [email or web address] [facility] 07/27/2019, 9:21 AM

## 2019-07-27 NOTE — TOC Transition Note (Addendum)
Transition of Care [facility]) - CM/SW Discharge Note   Patient Details  Name: Melissa [name] MRN: [id number] Date of Birth: [birth year]  Transition of Care [facility]) CM/SW Contact:  Maryclare Labrador, RN Phone Number: 07/27/2019, 9:44 AM   Clinical Narrative:   Pt will discharge home today via private vehicle driven by daughter.  CM informed Alvis Lemmings of discharge today and agency confirms that have accepted referral for all modalities of HH.   Pt states she already has a 3:1  [employer] needs to confirm that they have the signed form for the [employer]lovent - once form is received [employer] confirms that portable oxygen tank will be taken to pts room prior to discharge and that the afflovent will be delivered to pts home.    Update:  [employer]lov[employer] form was not signed by attending on 10/12 as communicated.  CM requested today's attending to sign.   Final next level of care: [place] Barriers to Discharge: Barriers Resolved   Patient Goals and CMS Choice Patient states their goals for this hospitalization and ongoing recovery are:: Pt would like to return home CMS Medicare.gov Compare Post Acute Care list provided to:: Patient Choice offered to / list presented to : Patient  Discharge Placement                       Discharge Plan and Services In-house Referral: Clinical Social Work   Post Acute Care Choice: Home Health          DME Arranged: Oxygen DME Agency: [employer] Date DME Agency Contacted: 07/26/19 Time DME Agency Contacted: 84 Representative spoke with at DME Agency: [place]: RN, PT, OT, Nurse's Aide, Social Work [employer] Agency: [place] Date [place]: 07/16/19 Time [place]: 1732 Representative spoke with at [place]: [place] ([place]) Interventions     Readmission Risk Interventions No flowsheet data found.

## 2019-07-27 NOTE — Progress Notes (Signed)
NAME:  Melissa [name], MRN:  [id number], DOB:  [birth year], LOS: 64 ADMISSION DATE:  07/10/2019, CONSULTATION DATE:   REFERRING MD: Doristine Bosworth , CHIEF COMPLAINT: New hypoxic respiratory failure  Brief History   85 year old female patient with multiple medical comorbidities most importantly including history of bronchiectasis (felt either NTM or aspiration related), mild obstructive lung disease with FEV1 69% with 5% improvement following bronchodilators and systolic cardiomyopathy with EF 20%.  Admitted 9/26 with new hypoxic respiratory failure and working diagnosis of community-acquired pneumonia versus aspiration pulmonary.  PCCM consulted 9/30 for ongoing supplemental oxygen needs.  10/6 due to continued hypoxia empiric antibiotics ans steroids were resumed, autoimmune and vasculitis profile as well as ERS were sent.   Past Medical History  Recurrent pneumonia Chronic cough Bronchiectasis felt possibly either secondary to MAI or chronic aspiration Dysphasia is identified by modified barium swallow August 2019 Mild obstructive disease with FEV1 69%, improved to 74% with bronchodilators by spirometry September 2257 Chronic systolic heart failure with ejection fraction of 20% Hypertension Atrial fibrillation on Eliquis Diabetes  Significant Hospital Events   9/26 admitted. Started on oxygen azith and rocephin 9/27 feeling a little better but still short of breath with activity still having cough 9/28: Antibiotics broadened, placed on Zosyn instead of Rocephin to cover for possible aspiration 9/29: Still short of breath down to 3 L nasal cannula working with physical therapy, 9/30: Pulmonary consulted as patient still oxygen dependent. 10/5 still on 5-6 liters CXR not much improved. abx stopped.  10/6 cxr worse. No sig fever spike. CBC about the same.  10/8 - No significant events reported overnight. She is sitting up in bedside recliner, reports improvement in dyspnea and cough compared to  yesterday  Consults:  Pulmonary consult on 9/30  Procedures:    Significant Diagnostic Tests:  CT chest 10/6 >> Severe multilobar bronchopneumonia, no definite findings to indicate underlying interstitial lung disease. Small bilateral pleural effusions, aortic atherosclerosis. Hepatic steatosis     Micro Data:  COVID 9/26 >> neg Sputum culture 9/27 >> Normal flora Respiratory viral panel 9/30 >> eg Urine strep antigen 9/30 >> neg Urine Legionella antigen 9/30 >> neg Induced sputum for afb 10/6 >> neg  Antimicrobials:  azithromycin 9/26 >> Rocephin 9/26 >>9/27 Zosyn 9/27>>>9/30 augmentin 9/20>>>10/5 Cefepime 10/6 >> 10/12 Vancomycin 10/6 >> 10/12  Interim history/subjective:  No acute events overnight, she is sitting up in bed eating breakfast. States dyspnea is improvedat rest but she still endorses dyspnea on exertion.   Objective   Blood pressure 132/70, pulse 72, temperature 98.7 F (37.1 C), temperature source Axillary, resp. rate 18, height _0  (1.651 m), weight 68.2 kg, SpO2 98 %.        Intake/Output Summary (Last 24 hours) at 07/27/2019 0849 Last data filed at 07/27/2019 0500 Gross per 24 hour  Intake 440 ml  Output 1200 ml  Net -760 ml   Filed Weights   07/11/19 1415  Weight: 68.2 kg   Physical Exam: General: Chronically ill appearing elderly female lying in bed, in NAD HEENT: MM pink/moist, PERRL,  Neuro: Alert and oriented x3, no focal deficits CV: s1s2 irregular rate and rhythm, no murmur, rubs, or gallops,  PULM: Diminished breath sounds in bilateral bases, no added sounds,  Minimally prodcutive cough clear white sputum  GI: soft, bowel sounds active in all 4 quadrants, non-tender, non-distended Extremities: warm/dry, no edema  Skin: no rashes or lesions  Assessment & Plan:   Acute on chronic hypoxic respiratory failure in the  setting of bilateral patchy pulmonary infiltrates with known history of bronchiectasis and mild obstructive lung  disease w/ + BD response.  -Autoimmune workup; ESR > 97, ANA > neg, ANCA > neg, GBM > neg,  Anti-DNA > neg, RF > 21.9 and nearly neg, CCP > neg, SSA > neg, SSB > neg,  Anti-SCL > neg -Differential diagnosis includes: aspiration PNA vs BTX flare vs atypical infection (we have been concerned about MAI) also suspect that there is an element of edema  -HRCT read as severe multilobar bronchopneumonia, PCT and RVP negative  -completed second round of antibiotics 10/12 P: Continue supplemental oxygen sat goal of > 90% Continue bronchodilators  Continue steroid therapy Encourage frequent pulmonary hygiene F/U as outpatient (appointment made for 08/02/19 with Dr. Hale Bogus Encourage PT / Mobility efforts  Qualified for home oxygen per ambulatory desaturation study   Systolic CM (EF 78%) w/ h/o PAF; Had recurrent RVR 9/29 -currently NSR w/ last QTc 488 P: Per primary team  Continuous telemetry  Continue DOAC Amiodarone taper, no good candidate for long term use given underlying lung disease  Further diuretics per primary team   Continuous telemetry  Further diuretics per primary  Continue Amiodarone with slow taper and beta blocker Not a good candidate for long term Amiodarone use given underlying lung disease  Continue DOAC  Rest per primary   Best practice:  Diet: Car mod heart healthy  Pain/Anxiety/Delirium protocol (if indicated): N/A DVT prophylaxis: Eliquis Glucose control: SSI Mobility: OOB PT / OT Code Status: Full Family Communication: Per primary  Disposition: Per primary   Labs   CBC: Recent Labs  Lab 07/22/19 0342 07/23/19 0434 07/24/19 0412 07/25/19 0610 07/27/19 0436  WBC 16.5* 12.8* 13.5* 12.4* 14.0*  NEUTROABS 15.1* 11.5* 11.9* 10.6* 10.0*  HGB 10.0* 10.8* 11.1* 10.6* 11.3*  HCT 30.9* 34.5* 35.4* 34.6* 34.9*  MCV 88.5 90.1 90.1 89.9 87.9  PLT 474* 577* 553* 531* 426*    Basic Metabolic Panel: Recent Labs  Lab 07/22/19 0342 07/23/19 0434 07/24/19 0412  07/24/19 1400 07/25/19 0610 07/27/19 0436  NA 132* 132* 131*  --  132* 132*  K 5.3* 5.4* 6.2* 4.3 4.5 3.9  CL 87* 85* 86*  --  87* 87*  CO2 37* 37* 36*  --  33* 34*  GLUCOSE 190* 249* 256*  --  257* 151*  BUN 34* 35* 36*  --  33* 33*  CREATININE 1.53* 1.30* 1.27*  --  1.26* 1.54*  CALCIUM 8.1* 8.4* 8.7*  --  8.5* 8.3*   GFR: Estimated Creatinine Clearance: 22.3 mL/min (A) (by C-G formula based on SCr of 1.54 mg/dL (H)). Recent Labs  Lab 07/23/19 0434 07/24/19 0412 07/25/19 0610 07/27/19 0436  WBC 12.8* 13.5* 12.4* 14.0*    Liver Function Tests: No results for input(s): AST, ALT, ALKPHOS, BILITOT, PROT, ALBUMIN in the last 168 hours. No results for input(s): LIPASE, AMYLASE in the last 168 hours. No results for input(s): AMMONIA in the last 168 hours.  ABG    Component Value Date/Time   PHART 7.464 (H) 07/19/2019 1556   PCO2ART 54.8 (H) 07/19/2019 1556   PO2ART 60.6 (L) 07/19/2019 1556   HCO3 39.0 (H) 07/19/2019 1556   TCO2 25 11/14/2015 1603   O2SAT 91.9 07/19/2019 1556     Coagulation Profile: No results for input(s): INR, PROTIME in the last 168 hours.  Cardiac Enzymes: No results for input(s): CKTOTAL, CKMB, CKMBINDEX, TROPONINI in the last 168 hours.  HbA1C: Hemoglobin A1C  Date/Time Value  Ref Range Status  05/05/2019 04:48 PM 6.1 (A) 4.0 - 5.6 % Final   Hgb A1c MFr Bld  Date/Time Value Ref Range Status  11/30/2018 11:46 AM 7.7 (H) 4.6 - 6.5 % Final    Comment:    Glycemic Control Guidelines for People with Diabetes:Non Diabetic:  <6%Goal of Therapy: <7%Additional Action Suggested:  >8%   07/17/2018 10:15 AM 6.6 (H) 4.6 - 6.5 % Final    Comment:    Glycemic Control Guidelines for People with Diabetes:Non Diabetic:  <6%Goal of Therapy: <7%Additional Action Suggested:  >8%     CBG: Recent Labs  Lab 07/26/19 0809 07/26/19 1231 07/26/19 1609 07/26/19 2049 07/27/19 0733  GLUCAP 296* 301* 348* 259* 129*    Signituare:     Johnsie Cancel, NP-C  [facility] Pgr: [phone] or if no answer [phone] 07/27/2019, 9:01 AM

## 2019-07-28 ENCOUNTER — Other Ambulatory Visit: Payer: Self-pay | Admitting: Cardiology

## 2019-07-28 ENCOUNTER — Other Ambulatory Visit: Payer: Self-pay | Admitting: Neurology

## 2019-07-28 MED ORDER — PRAMIPEXOLE DIHYDROCHLORIDE 0.75 MG PO TABS: 0.7500 mg | ORAL_TABLET | Freq: Every day | ORAL | 1 refills | Status: DC

## 2019-07-28 MED ORDER — AMIODARONE HCL 200 MG PO TABS: 200.0000 mg | ORAL_TABLET | Freq: Every day | ORAL | 1 refills | Status: DC

## 2019-07-29 DIAGNOSIS — J984 Other disorders of lung: Secondary | ICD-10-CM | POA: Diagnosis not present

## 2019-07-29 DIAGNOSIS — I130 Hypertensive heart and chronic kidney disease with heart failure and stage 1 through stage 4 chronic kidney disease, or unspecified chronic kidney disease: Secondary | ICD-10-CM | POA: Diagnosis not present

## 2019-07-29 DIAGNOSIS — J470 Bronchiectasis with acute lower respiratory infection: Secondary | ICD-10-CM | POA: Diagnosis not present

## 2019-07-29 DIAGNOSIS — G8929 Other chronic pain: Secondary | ICD-10-CM | POA: Diagnosis not present

## 2019-07-29 DIAGNOSIS — Z9981 Dependence on supplemental oxygen: Secondary | ICD-10-CM | POA: Diagnosis not present

## 2019-07-29 DIAGNOSIS — I2510 Atherosclerotic heart disease of native coronary artery without angina pectoris: Secondary | ICD-10-CM | POA: Diagnosis not present

## 2019-07-29 DIAGNOSIS — Z9049 Acquired absence of other specified parts of digestive tract: Secondary | ICD-10-CM | POA: Diagnosis not present

## 2019-07-29 DIAGNOSIS — E039 Hypothyroidism, unspecified: Secondary | ICD-10-CM | POA: Diagnosis not present

## 2019-07-29 DIAGNOSIS — M549 Dorsalgia, unspecified: Secondary | ICD-10-CM | POA: Diagnosis not present

## 2019-07-29 DIAGNOSIS — Z794 Long term (current) use of insulin: Secondary | ICD-10-CM | POA: Diagnosis not present

## 2019-07-29 DIAGNOSIS — I5022 Chronic systolic (congestive) heart failure: Secondary | ICD-10-CM | POA: Diagnosis not present

## 2019-07-29 DIAGNOSIS — E785 Hyperlipidemia, unspecified: Secondary | ICD-10-CM | POA: Diagnosis not present

## 2019-07-29 DIAGNOSIS — E1122 Type 2 diabetes mellitus with diabetic chronic kidney disease: Secondary | ICD-10-CM | POA: Diagnosis not present

## 2019-07-29 DIAGNOSIS — D631 Anemia in chronic kidney disease: Secondary | ICD-10-CM | POA: Diagnosis not present

## 2019-07-29 DIAGNOSIS — E1140 Type 2 diabetes mellitus with diabetic neuropathy, unspecified: Secondary | ICD-10-CM | POA: Diagnosis not present

## 2019-07-29 DIAGNOSIS — I700 Atherosclerosis of aorta: Secondary | ICD-10-CM | POA: Diagnosis not present

## 2019-07-29 DIAGNOSIS — K760 Fatty (change of) liver, not elsewhere classified: Secondary | ICD-10-CM | POA: Diagnosis not present

## 2019-07-29 DIAGNOSIS — J180 Bronchopneumonia, unspecified organism: Secondary | ICD-10-CM | POA: Diagnosis not present

## 2019-07-29 DIAGNOSIS — J9811 Atelectasis: Secondary | ICD-10-CM | POA: Diagnosis not present

## 2019-07-29 DIAGNOSIS — Z792 Long term (current) use of antibiotics: Secondary | ICD-10-CM | POA: Diagnosis not present

## 2019-07-29 DIAGNOSIS — Z7901 Long term (current) use of anticoagulants: Secondary | ICD-10-CM | POA: Diagnosis not present

## 2019-07-29 DIAGNOSIS — J9601 Acute respiratory failure with hypoxia: Secondary | ICD-10-CM | POA: Diagnosis not present

## 2019-07-29 DIAGNOSIS — Z9181 History of falling: Secondary | ICD-10-CM | POA: Diagnosis not present

## 2019-07-29 DIAGNOSIS — N183 Chronic kidney disease, stage 3 unspecified: Secondary | ICD-10-CM | POA: Diagnosis not present

## 2019-07-29 DIAGNOSIS — I480 Paroxysmal atrial fibrillation: Secondary | ICD-10-CM | POA: Diagnosis not present

## 2019-07-29 DIAGNOSIS — J479 Bronchiectasis, uncomplicated: Secondary | ICD-10-CM | POA: Diagnosis not present

## 2019-07-31 DIAGNOSIS — I5022 Chronic systolic (congestive) heart failure: Secondary | ICD-10-CM | POA: Diagnosis not present

## 2019-07-31 DIAGNOSIS — N183 Chronic kidney disease, stage 3 unspecified: Secondary | ICD-10-CM | POA: Diagnosis not present

## 2019-07-31 DIAGNOSIS — J9601 Acute respiratory failure with hypoxia: Secondary | ICD-10-CM | POA: Diagnosis not present

## 2019-07-31 DIAGNOSIS — E1122 Type 2 diabetes mellitus with diabetic chronic kidney disease: Secondary | ICD-10-CM | POA: Diagnosis not present

## 2019-07-31 DIAGNOSIS — I130 Hypertensive heart and chronic kidney disease with heart failure and stage 1 through stage 4 chronic kidney disease, or unspecified chronic kidney disease: Secondary | ICD-10-CM | POA: Diagnosis not present

## 2019-07-31 DIAGNOSIS — I2510 Atherosclerotic heart disease of native coronary artery without angina pectoris: Secondary | ICD-10-CM | POA: Diagnosis not present

## 2019-07-31 DIAGNOSIS — D631 Anemia in chronic kidney disease: Secondary | ICD-10-CM | POA: Diagnosis not present

## 2019-07-31 DIAGNOSIS — J180 Bronchopneumonia, unspecified organism: Secondary | ICD-10-CM | POA: Diagnosis not present

## 2019-07-31 DIAGNOSIS — J470 Bronchiectasis with acute lower respiratory infection: Secondary | ICD-10-CM | POA: Diagnosis not present

## 2019-08-02 ENCOUNTER — Other Ambulatory Visit: Payer: Self-pay

## 2019-08-02 ENCOUNTER — Ambulatory Visit (INDEPENDENT_AMBULATORY_CARE_PROVIDER_SITE_OTHER): Payer: Medicare Other | Admitting: Pulmonary Disease

## 2019-08-02 ENCOUNTER — Telehealth: Payer: Self-pay | Admitting: Pulmonary Disease

## 2019-08-02 ENCOUNTER — Encounter: Payer: Self-pay | Admitting: Pulmonary Disease

## 2019-08-02 VITALS — BP 104/54 | HR 85 | Temp 98.0°F | Ht 65.0 in | Wt 147.6 lb

## 2019-08-02 DIAGNOSIS — J479 Bronchiectasis, uncomplicated: Secondary | ICD-10-CM

## 2019-08-02 DIAGNOSIS — J180 Bronchopneumonia, unspecified organism: Secondary | ICD-10-CM | POA: Diagnosis not present

## 2019-08-02 MED ORDER — UMECLIDINIUM BROMIDE 62.5 MCG/INH IN AEPB: 1.0000 | INHALATION_SPRAY | Freq: Every day | RESPIRATORY_TRACT | 6 refills | Status: DC
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
## 2019-08-02 NOTE — Addendum Note (Signed)
Addended by: Amado Coe on: 08/02/2019 12:22 PM   Modules accepted: Orders

## 2019-08-02 NOTE — Patient Instructions (Addendum)
Obstructive lung disease Bronchiectasis --CONTINUE your INCRUSE daily --CONTINUE ATROVENT (2puffs) OR XOPENEX nebulizer treatment twice a day followed by your flutter valve to help keeps your lungs clear --COMPLETE your steroids. No further steroids needed.  --Discuss with your Cardiologist regarding alternatives to amiodarone use as this can cause lung toxicity. Ok to continue from a Pulmonary standpoint for now.

## 2019-08-02 NOTE — Telephone Encounter (Signed)
Received call from [place] at [facility] Microbiology Department. He stated that patient's AFB culture is positive and they are working on further identification.   Routing to Dr. Loanne Drilling.

## 2019-08-03 DIAGNOSIS — J470 Bronchiectasis with acute lower respiratory infection: Secondary | ICD-10-CM | POA: Diagnosis not present

## 2019-08-03 DIAGNOSIS — I130 Hypertensive heart and chronic kidney disease with heart failure and stage 1 through stage 4 chronic kidney disease, or unspecified chronic kidney disease: Secondary | ICD-10-CM | POA: Diagnosis not present

## 2019-08-03 DIAGNOSIS — J180 Bronchopneumonia, unspecified organism: Secondary | ICD-10-CM | POA: Diagnosis not present

## 2019-08-03 DIAGNOSIS — D631 Anemia in chronic kidney disease: Secondary | ICD-10-CM | POA: Diagnosis not present

## 2019-08-03 DIAGNOSIS — I5022 Chronic systolic (congestive) heart failure: Secondary | ICD-10-CM | POA: Diagnosis not present

## 2019-08-03 DIAGNOSIS — N183 Chronic kidney disease, stage 3 unspecified: Secondary | ICD-10-CM | POA: Diagnosis not present

## 2019-08-03 DIAGNOSIS — J9601 Acute respiratory failure with hypoxia: Secondary | ICD-10-CM | POA: Diagnosis not present

## 2019-08-03 DIAGNOSIS — E1122 Type 2 diabetes mellitus with diabetic chronic kidney disease: Secondary | ICD-10-CM | POA: Diagnosis not present

## 2019-08-03 DIAGNOSIS — I2510 Atherosclerotic heart disease of native coronary artery without angina pectoris: Secondary | ICD-10-CM | POA: Diagnosis not present

## 2019-08-05 ENCOUNTER — Telehealth: Payer: Self-pay | Admitting: Pulmonary Disease

## 2019-08-05 DIAGNOSIS — E1122 Type 2 diabetes mellitus with diabetic chronic kidney disease: Secondary | ICD-10-CM | POA: Diagnosis not present

## 2019-08-05 DIAGNOSIS — N183 Chronic kidney disease, stage 3 unspecified: Secondary | ICD-10-CM | POA: Diagnosis not present

## 2019-08-05 DIAGNOSIS — I5022 Chronic systolic (congestive) heart failure: Secondary | ICD-10-CM | POA: Diagnosis not present

## 2019-08-05 DIAGNOSIS — D631 Anemia in chronic kidney disease: Secondary | ICD-10-CM | POA: Diagnosis not present

## 2019-08-05 DIAGNOSIS — I130 Hypertensive heart and chronic kidney disease with heart failure and stage 1 through stage 4 chronic kidney disease, or unspecified chronic kidney disease: Secondary | ICD-10-CM | POA: Diagnosis not present

## 2019-08-05 DIAGNOSIS — I2510 Atherosclerotic heart disease of native coronary artery without angina pectoris: Secondary | ICD-10-CM | POA: Diagnosis not present

## 2019-08-05 DIAGNOSIS — J470 Bronchiectasis with acute lower respiratory infection: Secondary | ICD-10-CM | POA: Diagnosis not present

## 2019-08-05 DIAGNOSIS — J180 Bronchopneumonia, unspecified organism: Secondary | ICD-10-CM | POA: Diagnosis not present

## 2019-08-05 DIAGNOSIS — J9601 Acute respiratory failure with hypoxia: Secondary | ICD-10-CM | POA: Diagnosis not present

## 2019-08-05 NOTE — Telephone Encounter (Signed)
[facility] Telephone Encounter  Unable to reach patient on listed telephone. Previously told on last clinic visit, ok to reach daughter as patient has difficulty with vision and hearing to answer phone at times.  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 Per daughter, patient is stable. Three days ago, she was more fatigued and short of breath however she had physical therapy and doctor appointments that day. Overall, she is doing well since hospital discharge and unchanged since our last clinic visit.  I advised for daughter and patient to contact me if symptoms worsened and we could consider bronchoscopy for repeat cultures. If she has second culture confirming persistent mycobacterium, we would consider referral to ID for treatment. Daughter expressed understanding.  Scheduled for follow-up with me after CT Chest in November.  Rodman Pickle, M.D. [facility] 08/05/2019 11:44 AM

## 2019-08-05 NOTE — Telephone Encounter (Signed)
Thanks. Telephone encounter documented.

## 2019-08-06 DIAGNOSIS — N183 Chronic kidney disease, stage 3 unspecified: Secondary | ICD-10-CM | POA: Diagnosis not present

## 2019-08-06 DIAGNOSIS — I480 Paroxysmal atrial fibrillation: Secondary | ICD-10-CM | POA: Diagnosis not present

## 2019-08-06 DIAGNOSIS — E1122 Type 2 diabetes mellitus with diabetic chronic kidney disease: Secondary | ICD-10-CM | POA: Diagnosis not present

## 2019-08-06 DIAGNOSIS — Z79899 Other long term (current) drug therapy: Secondary | ICD-10-CM | POA: Diagnosis not present

## 2019-08-06 DIAGNOSIS — B356 Tinea cruris: Secondary | ICD-10-CM | POA: Diagnosis not present

## 2019-08-06 DIAGNOSIS — J180 Bronchopneumonia, unspecified organism: Secondary | ICD-10-CM | POA: Diagnosis not present

## 2019-08-06 DIAGNOSIS — I130 Hypertensive heart and chronic kidney disease with heart failure and stage 1 through stage 4 chronic kidney disease, or unspecified chronic kidney disease: Secondary | ICD-10-CM | POA: Diagnosis not present

## 2019-08-06 DIAGNOSIS — I2510 Atherosclerotic heart disease of native coronary artery without angina pectoris: Secondary | ICD-10-CM | POA: Diagnosis not present

## 2019-08-06 DIAGNOSIS — J471 Bronchiectasis with (acute) exacerbation: Secondary | ICD-10-CM | POA: Diagnosis not present

## 2019-08-06 DIAGNOSIS — J470 Bronchiectasis with acute lower respiratory infection: Secondary | ICD-10-CM | POA: Diagnosis not present

## 2019-08-06 DIAGNOSIS — I5022 Chronic systolic (congestive) heart failure: Secondary | ICD-10-CM | POA: Diagnosis not present

## 2019-08-06 DIAGNOSIS — D631 Anemia in chronic kidney disease: Secondary | ICD-10-CM | POA: Diagnosis not present

## 2019-08-06 DIAGNOSIS — J9601 Acute respiratory failure with hypoxia: Secondary | ICD-10-CM | POA: Diagnosis not present

## 2019-08-06 LAB — BASIC METABOLIC PANEL
BUN: 16 (ref 4–21)
Creatinine: 1.4 — AB (ref 0.5–1.1)
Glucose: 176
Potassium: 4.3 (ref 3.4–5.3)
Sodium: 133 — AB (ref 137–147)

## 2019-08-07 DIAGNOSIS — I5022 Chronic systolic (congestive) heart failure: Secondary | ICD-10-CM | POA: Diagnosis not present

## 2019-08-07 DIAGNOSIS — J470 Bronchiectasis with acute lower respiratory infection: Secondary | ICD-10-CM | POA: Diagnosis not present

## 2019-08-07 DIAGNOSIS — D631 Anemia in chronic kidney disease: Secondary | ICD-10-CM | POA: Diagnosis not present

## 2019-08-07 DIAGNOSIS — I2510 Atherosclerotic heart disease of native coronary artery without angina pectoris: Secondary | ICD-10-CM | POA: Diagnosis not present

## 2019-08-07 DIAGNOSIS — E1122 Type 2 diabetes mellitus with diabetic chronic kidney disease: Secondary | ICD-10-CM | POA: Diagnosis not present

## 2019-08-07 DIAGNOSIS — N183 Chronic kidney disease, stage 3 unspecified: Secondary | ICD-10-CM | POA: Diagnosis not present

## 2019-08-07 DIAGNOSIS — J9601 Acute respiratory failure with hypoxia: Secondary | ICD-10-CM | POA: Diagnosis not present

## 2019-08-07 DIAGNOSIS — I130 Hypertensive heart and chronic kidney disease with heart failure and stage 1 through stage 4 chronic kidney disease, or unspecified chronic kidney disease: Secondary | ICD-10-CM | POA: Diagnosis not present

## 2019-08-07 DIAGNOSIS — J180 Bronchopneumonia, unspecified organism: Secondary | ICD-10-CM | POA: Diagnosis not present

## 2019-08-08 ENCOUNTER — Other Ambulatory Visit: Payer: Self-pay | Admitting: Endocrinology

## 2019-08-08 DIAGNOSIS — E1165 Type 2 diabetes mellitus with hyperglycemia: Secondary | ICD-10-CM

## 2019-08-08 DIAGNOSIS — E063 Autoimmune thyroiditis: Secondary | ICD-10-CM

## 2019-08-08 DIAGNOSIS — Z794 Long term (current) use of insulin: Secondary | ICD-10-CM

## 2019-08-09 ENCOUNTER — Telehealth: Payer: Self-pay | Admitting: Neurology

## 2019-08-09 DIAGNOSIS — D631 Anemia in chronic kidney disease: Secondary | ICD-10-CM | POA: Diagnosis not present

## 2019-08-09 DIAGNOSIS — E1122 Type 2 diabetes mellitus with diabetic chronic kidney disease: Secondary | ICD-10-CM | POA: Diagnosis not present

## 2019-08-09 DIAGNOSIS — J470 Bronchiectasis with acute lower respiratory infection: Secondary | ICD-10-CM | POA: Diagnosis not present

## 2019-08-09 DIAGNOSIS — J180 Bronchopneumonia, unspecified organism: Secondary | ICD-10-CM | POA: Diagnosis not present

## 2019-08-09 DIAGNOSIS — I130 Hypertensive heart and chronic kidney disease with heart failure and stage 1 through stage 4 chronic kidney disease, or unspecified chronic kidney disease: Secondary | ICD-10-CM | POA: Diagnosis not present

## 2019-08-09 DIAGNOSIS — I5022 Chronic systolic (congestive) heart failure: Secondary | ICD-10-CM | POA: Diagnosis not present

## 2019-08-09 DIAGNOSIS — N183 Chronic kidney disease, stage 3 unspecified: Secondary | ICD-10-CM | POA: Diagnosis not present

## 2019-08-09 DIAGNOSIS — J9601 Acute respiratory failure with hypoxia: Secondary | ICD-10-CM | POA: Diagnosis not present

## 2019-08-09 DIAGNOSIS — I2510 Atherosclerotic heart disease of native coronary artery without angina pectoris: Secondary | ICD-10-CM | POA: Diagnosis not present

## 2019-08-09 LAB — RAPID GROWER BROTH SUSCEP.
Ciprofloxacin: 1
Tigecycline: 0.06

## 2019-08-09 LAB — AFB ORGANISM ID BY DNA PROBE
M avium complex: NEGATIVE
M tuberculosis complex: NEGATIVE

## 2019-08-09 LAB — ORG ID BY SEQUENCING RFLX AST

## 2019-08-09 LAB — ACID FAST CULTURE WITH REFLEXED SENSITIVITIES (MYCOBACTERIA): Acid Fast Culture: POSITIVE — AB

## 2019-08-09 NOTE — Addendum Note (Signed)
Addended by: Amado Coe on: 08/09/2019 03:14 PM   Modules accepted: Orders

## 2019-08-09 NOTE — Telephone Encounter (Signed)
The patient had recent blood work on 06 August 2019.  Glucose is 176, creatinine of 1.4, estimated GFR is 33.  Sodium was 133, potassium 4.3, BUN of 16, chloride of 92, CO2 28, calcium 8.8.

## 2019-08-10 ENCOUNTER — Other Ambulatory Visit: Payer: Medicare Other

## 2019-08-10 DIAGNOSIS — J180 Bronchopneumonia, unspecified organism: Secondary | ICD-10-CM | POA: Diagnosis not present

## 2019-08-10 DIAGNOSIS — I2510 Atherosclerotic heart disease of native coronary artery without angina pectoris: Secondary | ICD-10-CM | POA: Diagnosis not present

## 2019-08-10 DIAGNOSIS — I5022 Chronic systolic (congestive) heart failure: Secondary | ICD-10-CM | POA: Diagnosis not present

## 2019-08-10 DIAGNOSIS — N183 Chronic kidney disease, stage 3 unspecified: Secondary | ICD-10-CM | POA: Diagnosis not present

## 2019-08-10 DIAGNOSIS — E1122 Type 2 diabetes mellitus with diabetic chronic kidney disease: Secondary | ICD-10-CM | POA: Diagnosis not present

## 2019-08-10 DIAGNOSIS — J9601 Acute respiratory failure with hypoxia: Secondary | ICD-10-CM | POA: Diagnosis not present

## 2019-08-10 DIAGNOSIS — J470 Bronchiectasis with acute lower respiratory infection: Secondary | ICD-10-CM | POA: Diagnosis not present

## 2019-08-10 DIAGNOSIS — I130 Hypertensive heart and chronic kidney disease with heart failure and stage 1 through stage 4 chronic kidney disease, or unspecified chronic kidney disease: Secondary | ICD-10-CM | POA: Diagnosis not present

## 2019-08-10 DIAGNOSIS — D631 Anemia in chronic kidney disease: Secondary | ICD-10-CM | POA: Diagnosis not present

## 2019-08-12 ENCOUNTER — Telehealth: Payer: Self-pay

## 2019-08-12 ENCOUNTER — Other Ambulatory Visit: Payer: Self-pay | Admitting: Neurology

## 2019-08-12 DIAGNOSIS — E1122 Type 2 diabetes mellitus with diabetic chronic kidney disease: Secondary | ICD-10-CM | POA: Diagnosis not present

## 2019-08-12 DIAGNOSIS — I130 Hypertensive heart and chronic kidney disease with heart failure and stage 1 through stage 4 chronic kidney disease, or unspecified chronic kidney disease: Secondary | ICD-10-CM | POA: Diagnosis not present

## 2019-08-12 DIAGNOSIS — J180 Bronchopneumonia, unspecified organism: Secondary | ICD-10-CM

## 2019-08-12 DIAGNOSIS — N183 Chronic kidney disease, stage 3 unspecified: Secondary | ICD-10-CM | POA: Diagnosis not present

## 2019-08-12 DIAGNOSIS — J470 Bronchiectasis with acute lower respiratory infection: Secondary | ICD-10-CM | POA: Diagnosis not present

## 2019-08-12 DIAGNOSIS — J9601 Acute respiratory failure with hypoxia: Secondary | ICD-10-CM | POA: Diagnosis not present

## 2019-08-12 DIAGNOSIS — I2510 Atherosclerotic heart disease of native coronary artery without angina pectoris: Secondary | ICD-10-CM | POA: Diagnosis not present

## 2019-08-12 DIAGNOSIS — I5022 Chronic systolic (congestive) heart failure: Secondary | ICD-10-CM | POA: Diagnosis not present

## 2019-08-12 DIAGNOSIS — D631 Anemia in chronic kidney disease: Secondary | ICD-10-CM | POA: Diagnosis not present

## 2019-08-12 NOTE — Telephone Encounter (Signed)
Order placed for new CT.

## 2019-08-12 NOTE — Telephone Encounter (Signed)
-----   Message from Osvaldo Shipper, [place] sent at 08/12/2019  2:38 PM EDT ----- Regarding: CT need New order and to be Resch Please put in new order and reschedule patient at another facility. We are unable to do CT because of insurance. DR Loanne Drilling, pt scheduled for CT Chest High RES 11/18  Pt is NOT aware appt was canceled. Left message for pt 10/29  Thank you   Erline Levine

## 2019-08-12 NOTE — Telephone Encounter (Signed)
Order placed by Cherina. Nothing further needed at this time.

## 2019-08-13 ENCOUNTER — Ambulatory Visit: Payer: Medicare Other | Admitting: Endocrinology

## 2019-08-13 DIAGNOSIS — D631 Anemia in chronic kidney disease: Secondary | ICD-10-CM | POA: Diagnosis not present

## 2019-08-13 DIAGNOSIS — I130 Hypertensive heart and chronic kidney disease with heart failure and stage 1 through stage 4 chronic kidney disease, or unspecified chronic kidney disease: Secondary | ICD-10-CM | POA: Diagnosis not present

## 2019-08-13 DIAGNOSIS — I2510 Atherosclerotic heart disease of native coronary artery without angina pectoris: Secondary | ICD-10-CM | POA: Diagnosis not present

## 2019-08-13 DIAGNOSIS — J470 Bronchiectasis with acute lower respiratory infection: Secondary | ICD-10-CM | POA: Diagnosis not present

## 2019-08-13 DIAGNOSIS — N183 Chronic kidney disease, stage 3 unspecified: Secondary | ICD-10-CM | POA: Diagnosis not present

## 2019-08-13 DIAGNOSIS — J180 Bronchopneumonia, unspecified organism: Secondary | ICD-10-CM | POA: Diagnosis not present

## 2019-08-13 DIAGNOSIS — I5022 Chronic systolic (congestive) heart failure: Secondary | ICD-10-CM | POA: Diagnosis not present

## 2019-08-13 DIAGNOSIS — E1122 Type 2 diabetes mellitus with diabetic chronic kidney disease: Secondary | ICD-10-CM | POA: Diagnosis not present

## 2019-08-13 DIAGNOSIS — N390 Urinary tract infection, site not specified: Secondary | ICD-10-CM | POA: Diagnosis not present

## 2019-08-13 DIAGNOSIS — J9601 Acute respiratory failure with hypoxia: Secondary | ICD-10-CM | POA: Diagnosis not present

## 2019-08-16 ENCOUNTER — Other Ambulatory Visit: Payer: Self-pay

## 2019-08-16 ENCOUNTER — Ambulatory Visit (INDEPENDENT_AMBULATORY_CARE_PROVIDER_SITE_OTHER): Payer: Medicare Other | Admitting: Cardiology

## 2019-08-16 ENCOUNTER — Encounter: Payer: Self-pay | Admitting: Cardiology

## 2019-08-16 VITALS — BP 110/60 | HR 81 | Ht 65.0 in | Wt 155.4 lb

## 2019-08-16 DIAGNOSIS — Z7901 Long term (current) use of anticoagulants: Secondary | ICD-10-CM | POA: Diagnosis not present

## 2019-08-16 DIAGNOSIS — I10 Essential (primary) hypertension: Secondary | ICD-10-CM | POA: Diagnosis not present

## 2019-08-16 DIAGNOSIS — I420 Dilated cardiomyopathy: Secondary | ICD-10-CM | POA: Diagnosis not present

## 2019-08-16 DIAGNOSIS — I480 Paroxysmal atrial fibrillation: Secondary | ICD-10-CM

## 2019-08-16 DIAGNOSIS — I5022 Chronic systolic (congestive) heart failure: Secondary | ICD-10-CM | POA: Diagnosis not present

## 2019-08-16 NOTE — Patient Instructions (Signed)
Medication Instructions:  Please discontinue your Amiodarone.  Continue all other medications as listed.  *If you need a refill on your cardiac medications before your next appointment, please call your pharmacy*  Follow-Up: At [facility], you and your health needs are our priority.  As part of our continuing mission to provide you with exceptional heart care, we have created designated Provider Care Teams.  These Care Teams include your primary Cardiologist (physician) and Advanced Practice Providers (APPs -  Physician Assistants and Nurse Practitioners) who all work together to provide you with the care you need, when you need it.  Your next appointment:   3 months  The format for your next appointment:   In Person  Provider:   Kathyrn Drown, NP   Thank you for choosing [facility]!!
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
## 2019-08-17 ENCOUNTER — Ambulatory Visit: Payer: Medicare Other | Admitting: Endocrinology

## 2019-08-17 DIAGNOSIS — J470 Bronchiectasis with acute lower respiratory infection: Secondary | ICD-10-CM | POA: Diagnosis not present

## 2019-08-17 DIAGNOSIS — I5022 Chronic systolic (congestive) heart failure: Secondary | ICD-10-CM | POA: Diagnosis not present

## 2019-08-17 DIAGNOSIS — J180 Bronchopneumonia, unspecified organism: Secondary | ICD-10-CM | POA: Diagnosis not present

## 2019-08-17 DIAGNOSIS — D631 Anemia in chronic kidney disease: Secondary | ICD-10-CM | POA: Diagnosis not present

## 2019-08-17 DIAGNOSIS — N183 Chronic kidney disease, stage 3 unspecified: Secondary | ICD-10-CM | POA: Diagnosis not present

## 2019-08-17 DIAGNOSIS — J9601 Acute respiratory failure with hypoxia: Secondary | ICD-10-CM | POA: Diagnosis not present

## 2019-08-17 DIAGNOSIS — E1122 Type 2 diabetes mellitus with diabetic chronic kidney disease: Secondary | ICD-10-CM | POA: Diagnosis not present

## 2019-08-17 DIAGNOSIS — I130 Hypertensive heart and chronic kidney disease with heart failure and stage 1 through stage 4 chronic kidney disease, or unspecified chronic kidney disease: Secondary | ICD-10-CM | POA: Diagnosis not present

## 2019-08-17 DIAGNOSIS — I2510 Atherosclerotic heart disease of native coronary artery without angina pectoris: Secondary | ICD-10-CM | POA: Diagnosis not present

## 2019-08-18 DIAGNOSIS — N183 Chronic kidney disease, stage 3 unspecified: Secondary | ICD-10-CM | POA: Diagnosis not present

## 2019-08-18 DIAGNOSIS — E1122 Type 2 diabetes mellitus with diabetic chronic kidney disease: Secondary | ICD-10-CM | POA: Diagnosis not present

## 2019-08-18 DIAGNOSIS — I5022 Chronic systolic (congestive) heart failure: Secondary | ICD-10-CM | POA: Diagnosis not present

## 2019-08-18 DIAGNOSIS — D631 Anemia in chronic kidney disease: Secondary | ICD-10-CM | POA: Diagnosis not present

## 2019-08-18 DIAGNOSIS — I2510 Atherosclerotic heart disease of native coronary artery without angina pectoris: Secondary | ICD-10-CM | POA: Diagnosis not present

## 2019-08-18 DIAGNOSIS — J180 Bronchopneumonia, unspecified organism: Secondary | ICD-10-CM | POA: Diagnosis not present

## 2019-08-18 DIAGNOSIS — J9601 Acute respiratory failure with hypoxia: Secondary | ICD-10-CM | POA: Diagnosis not present

## 2019-08-18 DIAGNOSIS — J470 Bronchiectasis with acute lower respiratory infection: Secondary | ICD-10-CM | POA: Diagnosis not present

## 2019-08-18 DIAGNOSIS — I130 Hypertensive heart and chronic kidney disease with heart failure and stage 1 through stage 4 chronic kidney disease, or unspecified chronic kidney disease: Secondary | ICD-10-CM | POA: Diagnosis not present

## 2019-08-20 DIAGNOSIS — N183 Chronic kidney disease, stage 3 unspecified: Secondary | ICD-10-CM | POA: Diagnosis not present

## 2019-08-20 DIAGNOSIS — J9601 Acute respiratory failure with hypoxia: Secondary | ICD-10-CM | POA: Diagnosis not present

## 2019-08-20 DIAGNOSIS — J470 Bronchiectasis with acute lower respiratory infection: Secondary | ICD-10-CM | POA: Diagnosis not present

## 2019-08-20 DIAGNOSIS — I2510 Atherosclerotic heart disease of native coronary artery without angina pectoris: Secondary | ICD-10-CM | POA: Diagnosis not present

## 2019-08-20 DIAGNOSIS — E1122 Type 2 diabetes mellitus with diabetic chronic kidney disease: Secondary | ICD-10-CM | POA: Diagnosis not present

## 2019-08-20 DIAGNOSIS — D631 Anemia in chronic kidney disease: Secondary | ICD-10-CM | POA: Diagnosis not present

## 2019-08-20 DIAGNOSIS — I5022 Chronic systolic (congestive) heart failure: Secondary | ICD-10-CM | POA: Diagnosis not present

## 2019-08-20 DIAGNOSIS — J180 Bronchopneumonia, unspecified organism: Secondary | ICD-10-CM | POA: Diagnosis not present

## 2019-08-20 DIAGNOSIS — I130 Hypertensive heart and chronic kidney disease with heart failure and stage 1 through stage 4 chronic kidney disease, or unspecified chronic kidney disease: Secondary | ICD-10-CM | POA: Diagnosis not present

## 2019-08-23 DIAGNOSIS — I5022 Chronic systolic (congestive) heart failure: Secondary | ICD-10-CM | POA: Diagnosis not present

## 2019-08-23 DIAGNOSIS — E1122 Type 2 diabetes mellitus with diabetic chronic kidney disease: Secondary | ICD-10-CM | POA: Diagnosis not present

## 2019-08-23 DIAGNOSIS — J9601 Acute respiratory failure with hypoxia: Secondary | ICD-10-CM | POA: Diagnosis not present

## 2019-08-23 DIAGNOSIS — N183 Chronic kidney disease, stage 3 unspecified: Secondary | ICD-10-CM | POA: Diagnosis not present

## 2019-08-23 DIAGNOSIS — D631 Anemia in chronic kidney disease: Secondary | ICD-10-CM | POA: Diagnosis not present

## 2019-08-23 DIAGNOSIS — J180 Bronchopneumonia, unspecified organism: Secondary | ICD-10-CM | POA: Diagnosis not present

## 2019-08-23 DIAGNOSIS — I2510 Atherosclerotic heart disease of native coronary artery without angina pectoris: Secondary | ICD-10-CM | POA: Diagnosis not present

## 2019-08-23 DIAGNOSIS — I130 Hypertensive heart and chronic kidney disease with heart failure and stage 1 through stage 4 chronic kidney disease, or unspecified chronic kidney disease: Secondary | ICD-10-CM | POA: Diagnosis not present

## 2019-08-23 DIAGNOSIS — J470 Bronchiectasis with acute lower respiratory infection: Secondary | ICD-10-CM | POA: Diagnosis not present

## 2019-08-26 ENCOUNTER — Telehealth: Payer: Self-pay | Admitting: Pulmonary Disease

## 2019-08-26 NOTE — Telephone Encounter (Signed)
Called and spoke to Avril with [facility]. Avril stated that pt had CT high res on 07/20/2019, ordered by Salvadore Dom.  Pt currently has CT high res scheduled for 09/01/2019 that was ordered by Dr. Loanne Drilling on 08/02/2019. Avril is questioning if repeat scan is needed.   Dr. Loanne Drilling please advise. Thanks.

## 2019-08-27 DIAGNOSIS — J479 Bronchiectasis, uncomplicated: Secondary | ICD-10-CM | POA: Diagnosis not present

## 2019-08-27 NOTE — Telephone Encounter (Signed)
Yes, repeat HR scan indicated to evaluate for underlying ILD in non-acute setting. Please keep scheduled appt.

## 2019-08-27 NOTE — Telephone Encounter (Signed)
Spoke with Melissa [name] and notified of recs per Dr Loanne Drilling  She verbalized understanding  Pt to keep planned appt

## 2019-08-28 DIAGNOSIS — I480 Paroxysmal atrial fibrillation: Secondary | ICD-10-CM | POA: Diagnosis not present

## 2019-08-28 DIAGNOSIS — Z9981 Dependence on supplemental oxygen: Secondary | ICD-10-CM | POA: Diagnosis not present

## 2019-08-28 DIAGNOSIS — N183 Chronic kidney disease, stage 3 unspecified: Secondary | ICD-10-CM | POA: Diagnosis not present

## 2019-08-28 DIAGNOSIS — I130 Hypertensive heart and chronic kidney disease with heart failure and stage 1 through stage 4 chronic kidney disease, or unspecified chronic kidney disease: Secondary | ICD-10-CM | POA: Diagnosis not present

## 2019-08-28 DIAGNOSIS — I700 Atherosclerosis of aorta: Secondary | ICD-10-CM | POA: Diagnosis not present

## 2019-08-28 DIAGNOSIS — Z9181 History of falling: Secondary | ICD-10-CM | POA: Diagnosis not present

## 2019-08-28 DIAGNOSIS — J984 Other disorders of lung: Secondary | ICD-10-CM | POA: Diagnosis not present

## 2019-08-28 DIAGNOSIS — J180 Bronchopneumonia, unspecified organism: Secondary | ICD-10-CM | POA: Diagnosis not present

## 2019-08-28 DIAGNOSIS — M549 Dorsalgia, unspecified: Secondary | ICD-10-CM | POA: Diagnosis not present

## 2019-08-28 DIAGNOSIS — Z7901 Long term (current) use of anticoagulants: Secondary | ICD-10-CM | POA: Diagnosis not present

## 2019-08-28 DIAGNOSIS — Z792 Long term (current) use of antibiotics: Secondary | ICD-10-CM | POA: Diagnosis not present

## 2019-08-28 DIAGNOSIS — E1122 Type 2 diabetes mellitus with diabetic chronic kidney disease: Secondary | ICD-10-CM | POA: Diagnosis not present

## 2019-08-28 DIAGNOSIS — J470 Bronchiectasis with acute lower respiratory infection: Secondary | ICD-10-CM | POA: Diagnosis not present

## 2019-08-28 DIAGNOSIS — D631 Anemia in chronic kidney disease: Secondary | ICD-10-CM | POA: Diagnosis not present

## 2019-08-28 DIAGNOSIS — J9811 Atelectasis: Secondary | ICD-10-CM | POA: Diagnosis not present

## 2019-08-28 DIAGNOSIS — E785 Hyperlipidemia, unspecified: Secondary | ICD-10-CM | POA: Diagnosis not present

## 2019-08-28 DIAGNOSIS — Z794 Long term (current) use of insulin: Secondary | ICD-10-CM | POA: Diagnosis not present

## 2019-08-28 DIAGNOSIS — E039 Hypothyroidism, unspecified: Secondary | ICD-10-CM | POA: Diagnosis not present

## 2019-08-28 DIAGNOSIS — G8929 Other chronic pain: Secondary | ICD-10-CM | POA: Diagnosis not present

## 2019-08-28 DIAGNOSIS — E1140 Type 2 diabetes mellitus with diabetic neuropathy, unspecified: Secondary | ICD-10-CM | POA: Diagnosis not present

## 2019-08-28 DIAGNOSIS — I5022 Chronic systolic (congestive) heart failure: Secondary | ICD-10-CM | POA: Diagnosis not present

## 2019-08-28 DIAGNOSIS — I2510 Atherosclerotic heart disease of native coronary artery without angina pectoris: Secondary | ICD-10-CM | POA: Diagnosis not present

## 2019-08-28 DIAGNOSIS — Z9049 Acquired absence of other specified parts of digestive tract: Secondary | ICD-10-CM | POA: Diagnosis not present

## 2019-08-28 DIAGNOSIS — J9601 Acute respiratory failure with hypoxia: Secondary | ICD-10-CM | POA: Diagnosis not present

## 2019-08-28 DIAGNOSIS — K760 Fatty (change of) liver, not elsewhere classified: Secondary | ICD-10-CM | POA: Diagnosis not present

## 2019-08-29 DIAGNOSIS — J479 Bronchiectasis, uncomplicated: Secondary | ICD-10-CM | POA: Diagnosis not present

## 2019-09-01 ENCOUNTER — Other Ambulatory Visit: Payer: Medicare Other

## 2019-09-01 ENCOUNTER — Ambulatory Visit
Admission: RE | Admit: 2019-09-01 | Discharge: 2019-09-01 | Disposition: A | Discharge status: Home / Self Care | Payer: Medicare Other | Source: Ambulatory Visit | Attending: Pulmonary Disease | Admitting: Pulmonary Disease

## 2019-09-01 ENCOUNTER — Inpatient Hospital Stay: Admission: RE | Admit: 2019-09-01 | Payer: Medicare Other | Source: Ambulatory Visit

## 2019-09-01 ENCOUNTER — Other Ambulatory Visit: Payer: Self-pay

## 2019-09-01 DIAGNOSIS — J470 Bronchiectasis with acute lower respiratory infection: Secondary | ICD-10-CM | POA: Diagnosis not present

## 2019-09-01 DIAGNOSIS — N183 Chronic kidney disease, stage 3 unspecified: Secondary | ICD-10-CM | POA: Diagnosis not present

## 2019-09-01 DIAGNOSIS — J9601 Acute respiratory failure with hypoxia: Secondary | ICD-10-CM | POA: Diagnosis not present

## 2019-09-01 DIAGNOSIS — I2510 Atherosclerotic heart disease of native coronary artery without angina pectoris: Secondary | ICD-10-CM | POA: Diagnosis not present

## 2019-09-01 DIAGNOSIS — I5022 Chronic systolic (congestive) heart failure: Secondary | ICD-10-CM | POA: Diagnosis not present

## 2019-09-01 DIAGNOSIS — J180 Bronchopneumonia, unspecified organism: Secondary | ICD-10-CM

## 2019-09-01 DIAGNOSIS — D631 Anemia in chronic kidney disease: Secondary | ICD-10-CM | POA: Diagnosis not present

## 2019-09-01 DIAGNOSIS — I130 Hypertensive heart and chronic kidney disease with heart failure and stage 1 through stage 4 chronic kidney disease, or unspecified chronic kidney disease: Secondary | ICD-10-CM | POA: Diagnosis not present

## 2019-09-01 DIAGNOSIS — E1122 Type 2 diabetes mellitus with diabetic chronic kidney disease: Secondary | ICD-10-CM | POA: Diagnosis not present

## 2019-09-06 DIAGNOSIS — I130 Hypertensive heart and chronic kidney disease with heart failure and stage 1 through stage 4 chronic kidney disease, or unspecified chronic kidney disease: Secondary | ICD-10-CM | POA: Diagnosis not present

## 2019-09-06 DIAGNOSIS — D631 Anemia in chronic kidney disease: Secondary | ICD-10-CM | POA: Diagnosis not present

## 2019-09-06 DIAGNOSIS — J9601 Acute respiratory failure with hypoxia: Secondary | ICD-10-CM | POA: Diagnosis not present

## 2019-09-06 DIAGNOSIS — J180 Bronchopneumonia, unspecified organism: Secondary | ICD-10-CM | POA: Diagnosis not present

## 2019-09-06 DIAGNOSIS — J470 Bronchiectasis with acute lower respiratory infection: Secondary | ICD-10-CM | POA: Diagnosis not present

## 2019-09-06 DIAGNOSIS — N183 Chronic kidney disease, stage 3 unspecified: Secondary | ICD-10-CM | POA: Diagnosis not present

## 2019-09-06 DIAGNOSIS — E1122 Type 2 diabetes mellitus with diabetic chronic kidney disease: Secondary | ICD-10-CM | POA: Diagnosis not present

## 2019-09-06 DIAGNOSIS — I2510 Atherosclerotic heart disease of native coronary artery without angina pectoris: Secondary | ICD-10-CM | POA: Diagnosis not present

## 2019-09-06 DIAGNOSIS — I5022 Chronic systolic (congestive) heart failure: Secondary | ICD-10-CM | POA: Diagnosis not present

## 2019-09-07 NOTE — Progress Notes (Signed)
LMTCB

## 2019-09-13 DIAGNOSIS — J470 Bronchiectasis with acute lower respiratory infection: Secondary | ICD-10-CM | POA: Diagnosis not present

## 2019-09-13 DIAGNOSIS — D631 Anemia in chronic kidney disease: Secondary | ICD-10-CM | POA: Diagnosis not present

## 2019-09-13 DIAGNOSIS — N183 Chronic kidney disease, stage 3 unspecified: Secondary | ICD-10-CM | POA: Diagnosis not present

## 2019-09-13 DIAGNOSIS — I130 Hypertensive heart and chronic kidney disease with heart failure and stage 1 through stage 4 chronic kidney disease, or unspecified chronic kidney disease: Secondary | ICD-10-CM | POA: Diagnosis not present

## 2019-09-13 DIAGNOSIS — I5022 Chronic systolic (congestive) heart failure: Secondary | ICD-10-CM | POA: Diagnosis not present

## 2019-09-13 DIAGNOSIS — J180 Bronchopneumonia, unspecified organism: Secondary | ICD-10-CM | POA: Diagnosis not present

## 2019-09-13 DIAGNOSIS — E1122 Type 2 diabetes mellitus with diabetic chronic kidney disease: Secondary | ICD-10-CM | POA: Diagnosis not present

## 2019-09-13 DIAGNOSIS — J9601 Acute respiratory failure with hypoxia: Secondary | ICD-10-CM | POA: Diagnosis not present

## 2019-09-13 DIAGNOSIS — I2510 Atherosclerotic heart disease of native coronary artery without angina pectoris: Secondary | ICD-10-CM | POA: Diagnosis not present

## 2019-09-14 ENCOUNTER — Other Ambulatory Visit: Payer: Self-pay

## 2019-09-14 ENCOUNTER — Ambulatory Visit (INDEPENDENT_AMBULATORY_CARE_PROVIDER_SITE_OTHER): Payer: Medicare Other | Admitting: Pulmonary Disease

## 2019-09-14 DIAGNOSIS — R918 Other nonspecific abnormal finding of lung field: Secondary | ICD-10-CM | POA: Diagnosis not present

## 2019-09-14 DIAGNOSIS — J479 Bronchiectasis, uncomplicated: Secondary | ICD-10-CM

## 2019-09-14 DIAGNOSIS — J9611 Chronic respiratory failure with hypoxia: Secondary | ICD-10-CM
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
## 2019-09-14 NOTE — Patient Instructions (Signed)
CT Chest suggestive of interstitial lung process Autoimmune work-up work-up. Off the amiodarone. Unclear etiology through may be post-infectious with hx of recurrent pneumonia --Pulmonary function test   Chronic hypoxemic respiratory failure --Continue supplemental oxygen for goal O2 >88%  Obstructive lung disease Bronchiectasis --CONTINUE INCRUSE daily --CONTINUE ATROVENT (2puffs) OR XOPENEX nebulizer treatment twice a day followed by your flutter valve to help keeps your lungs clear  Single isolate of Mycobacterium mucogenicum/phocaicum --Obtain AFB

## 2019-09-20 DIAGNOSIS — J9601 Acute respiratory failure with hypoxia: Secondary | ICD-10-CM | POA: Diagnosis not present

## 2019-09-20 DIAGNOSIS — N183 Chronic kidney disease, stage 3 unspecified: Secondary | ICD-10-CM | POA: Diagnosis not present

## 2019-09-20 DIAGNOSIS — I130 Hypertensive heart and chronic kidney disease with heart failure and stage 1 through stage 4 chronic kidney disease, or unspecified chronic kidney disease: Secondary | ICD-10-CM | POA: Diagnosis not present

## 2019-09-20 DIAGNOSIS — E1122 Type 2 diabetes mellitus with diabetic chronic kidney disease: Secondary | ICD-10-CM | POA: Diagnosis not present

## 2019-09-20 DIAGNOSIS — J180 Bronchopneumonia, unspecified organism: Secondary | ICD-10-CM | POA: Diagnosis not present

## 2019-09-20 DIAGNOSIS — J470 Bronchiectasis with acute lower respiratory infection: Secondary | ICD-10-CM | POA: Diagnosis not present

## 2019-09-20 DIAGNOSIS — I2510 Atherosclerotic heart disease of native coronary artery without angina pectoris: Secondary | ICD-10-CM | POA: Diagnosis not present

## 2019-09-20 DIAGNOSIS — I5022 Chronic systolic (congestive) heart failure: Secondary | ICD-10-CM | POA: Diagnosis not present

## 2019-09-20 DIAGNOSIS — D631 Anemia in chronic kidney disease: Secondary | ICD-10-CM | POA: Diagnosis not present

## 2019-09-22 ENCOUNTER — Other Ambulatory Visit: Payer: Self-pay

## 2019-09-22 ENCOUNTER — Other Ambulatory Visit (INDEPENDENT_AMBULATORY_CARE_PROVIDER_SITE_OTHER): Payer: Medicare Other

## 2019-09-22 DIAGNOSIS — Z794 Long term (current) use of insulin: Secondary | ICD-10-CM | POA: Diagnosis not present

## 2019-09-22 DIAGNOSIS — E063 Autoimmune thyroiditis: Secondary | ICD-10-CM

## 2019-09-22 DIAGNOSIS — E1165 Type 2 diabetes mellitus with hyperglycemia: Secondary | ICD-10-CM | POA: Diagnosis not present

## 2019-09-22 LAB — URINALYSIS, ROUTINE W REFLEX MICROSCOPIC
Bilirubin Urine: NEGATIVE
Hgb urine dipstick: NEGATIVE
Ketones, ur: NEGATIVE
Nitrite: NEGATIVE
RBC / HPF: NONE SEEN (ref 0–?)
Specific Gravity, Urine: 1.02 (ref 1.000–1.030)
Total Protein, Urine: NEGATIVE
Urine Glucose: NEGATIVE
Urobilinogen, UA: 0.2 (ref 0.0–1.0)
pH: 6 (ref 5.0–8.0)

## 2019-09-22 LAB — COMPREHENSIVE METABOLIC PANEL
ALT: 13 U/L (ref 0–35)
AST: 14 U/L (ref 0–37)
Albumin: 4 g/dL (ref 3.5–5.2)
Alkaline Phosphatase: 55 U/L (ref 39–117)
BUN: 32 mg/dL — ABNORMAL HIGH (ref 6–23)
CO2: 33 mEq/L — ABNORMAL HIGH (ref 19–32)
Calcium: 9.4 mg/dL (ref 8.4–10.5)
Chloride: 104 mEq/L (ref 96–112)
Creatinine, Ser: 1.54 mg/dL — ABNORMAL HIGH (ref 0.40–1.20)
GFR: 31.68 mL/min — ABNORMAL LOW (ref >60.00)
Glucose, Bld: 76 mg/dL (ref 70–99)
Potassium: 4.5 mEq/L (ref 3.5–5.1)
Sodium: 142 mEq/L (ref 135–145)
Total Bilirubin: 0.4 mg/dL (ref 0.2–1.2)
Total Protein: 7.2 g/dL (ref 6.0–8.3)

## 2019-09-22 LAB — MICROALBUMIN / CREATININE URINE RATIO
Creatinine,U: 94.2 mg/dL
Microalb Creat Ratio: 2.4 mg/g (ref 0.0–30.0)
Microalb, Ur: 2.3 mg/dL — ABNORMAL HIGH (ref 0.0–1.9)

## 2019-09-22 LAB — T4, FREE: Free T4: 1.11 ng/dL (ref 0.60–1.60)

## 2019-09-22 LAB — HEMOGLOBIN A1C: Hgb A1c MFr Bld: 6.4 % (ref 4.6–6.5)

## 2019-09-22 LAB — TSH: TSH: 2.87 u[IU]/mL (ref 0.35–4.50)

## 2019-09-26 DIAGNOSIS — J479 Bronchiectasis, uncomplicated: Secondary | ICD-10-CM | POA: Diagnosis not present

## 2019-09-28 ENCOUNTER — Other Ambulatory Visit: Payer: Self-pay

## 2019-09-28 ENCOUNTER — Ambulatory Visit (INDEPENDENT_AMBULATORY_CARE_PROVIDER_SITE_OTHER): Payer: Medicare Other | Admitting: Endocrinology

## 2019-09-28 ENCOUNTER — Encounter: Payer: Self-pay | Admitting: Endocrinology

## 2019-09-28 VITALS — BP 136/70 | HR 84 | Ht 65.0 in | Wt 157.0 lb

## 2019-09-28 DIAGNOSIS — E063 Autoimmune thyroiditis: Secondary | ICD-10-CM | POA: Diagnosis not present

## 2019-09-28 DIAGNOSIS — J479 Bronchiectasis, uncomplicated: Secondary | ICD-10-CM | POA: Diagnosis not present

## 2019-09-28 DIAGNOSIS — E1165 Type 2 diabetes mellitus with hyperglycemia: Secondary | ICD-10-CM

## 2019-09-28 DIAGNOSIS — E1142 Type 2 diabetes mellitus with diabetic polyneuropathy: Secondary | ICD-10-CM | POA: Diagnosis not present

## 2019-09-28 DIAGNOSIS — Z794 Long term (current) use of insulin: Secondary | ICD-10-CM | POA: Diagnosis not present

## 2019-09-28 NOTE — Patient Instructions (Addendum)
Levemir 20 units at supper time  Check blood sugars on waking up days a week  Also check blood sugars about 2 hours after meals and do this after different meals by rotation  Recommended blood sugar levels on waking up are 90-130 and about 2 hours after meal is 130-180  Please bring your blood sugar monitor to each visit, thank you
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
## 2019-09-30 ENCOUNTER — Other Ambulatory Visit: Payer: Self-pay

## 2019-09-30 MED ORDER — METOPROLOL SUCCINATE ER 25 MG PO TB24: 25.0000 mg | ORAL_TABLET | Freq: Every day | ORAL | 3 refills | Status: DC

## 2019-11-01 ENCOUNTER — Telehealth: Payer: Self-pay | Admitting: Pulmonary Disease

## 2019-11-01 DIAGNOSIS — J9611 Chronic respiratory failure with hypoxia: Secondary | ICD-10-CM

## 2019-11-01 NOTE — Telephone Encounter (Signed)
I called and spoke with patient's daughter [name] and she states that she feels that she no longer needs the oxygen. She states that her stats have been between 95 and 97 with and without exertion and she has not been sob. She states that she has not been using the oxygen for the past 2-3 weeks and she has done great.   Dr. Loanne Drilling please advise if it is ok to place an order to D/C oxygen with [employer].

## 2019-11-01 NOTE — Telephone Encounter (Signed)
Order has been placed for pt to have her oxygen picked up. Called and spoke with pt's daughter [name] letting her know this had been done and she verbalized understanding. Nothing further needed.

## 2019-11-01 NOTE — Telephone Encounter (Signed)
Ok to place orders to DC oxygen

## 2019-11-08 DIAGNOSIS — H43813 Vitreous degeneration, bilateral: Secondary | ICD-10-CM | POA: Diagnosis not present

## 2019-11-08 DIAGNOSIS — H353134 Nonexudative age-related macular degeneration, bilateral, advanced atrophic with subfoveal involvement: Secondary | ICD-10-CM | POA: Diagnosis not present
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
## 2019-11-15 ENCOUNTER — Other Ambulatory Visit: Payer: Self-pay

## 2019-11-15 MED ORDER — PRAMIPEXOLE DIHYDROCHLORIDE 0.75 MG PO TABS: 0.7500 mg | ORAL_TABLET | Freq: Every day | ORAL | 1 refills | Status: DC

## 2019-11-16 ENCOUNTER — Other Ambulatory Visit: Payer: Self-pay

## 2019-11-16 MED ORDER — OZEMPIC (0.25 OR 0.5 MG/DOSE) 2 MG/1.5ML ~~LOC~~ SOPN: 0.5000 mg | PEN_INJECTOR | SUBCUTANEOUS | 1 refills | Status: DC

## 2019-11-17 ENCOUNTER — Ambulatory Visit (INDEPENDENT_AMBULATORY_CARE_PROVIDER_SITE_OTHER): Payer: Medicare Other | Admitting: Family Medicine

## 2019-11-17 ENCOUNTER — Encounter: Payer: Self-pay | Admitting: Cardiology

## 2019-11-17 ENCOUNTER — Other Ambulatory Visit: Payer: Self-pay

## 2019-11-17 ENCOUNTER — Ambulatory Visit: Payer: Medicare Other | Admitting: Cardiology

## 2019-11-17 VITALS — BP 126/52 | HR 96 | Ht 65.0 in | Wt 158.0 lb

## 2019-11-17 DIAGNOSIS — I10 Essential (primary) hypertension: Secondary | ICD-10-CM

## 2019-11-17 DIAGNOSIS — E119 Type 2 diabetes mellitus without complications: Secondary | ICD-10-CM

## 2019-11-17 DIAGNOSIS — I428 Other cardiomyopathies: Secondary | ICD-10-CM | POA: Diagnosis not present

## 2019-11-17 DIAGNOSIS — I480 Paroxysmal atrial fibrillation: Secondary | ICD-10-CM | POA: Diagnosis not present

## 2019-11-17 NOTE — Patient Instructions (Addendum)
Medication Instructions:   Your physician recommends that you continue on your current medications as directed. Please refer to the Current Medication list given to you today.  *If you need a refill on your cardiac medications before your next appointment, please call your pharmacy*  Lab Work:  None ordered today  If you have labs (blood work) drawn today and your tests are completely normal, you will receive your results only by: Marland Kitchen MyChart Message (if you have MyChart) OR . A paper copy in the mail If you have any lab test that is abnormal or we need to change your treatment, we will call you to review the results.  Testing/Procedures:  None ordered today  Follow-Up: At [facility], you and your health needs are our priority.  As part of our continuing mission to provide you with exceptional heart care, we have created designated Provider Care Teams.  These Care Teams include your primary Cardiologist (physician) and Advanced Practice Providers (APPs -  Physician Assistants and Nurse Practitioners) who all work together to provide you with the care you need, when you need it.  Your next appointment:    On 02/21/20 at 2:40PM with [name], MD

## 2019-12-07 DIAGNOSIS — N390 Urinary tract infection, site not specified: Secondary | ICD-10-CM | POA: Diagnosis not present

## 2019-12-09 DIAGNOSIS — J019 Acute sinusitis, unspecified: Secondary | ICD-10-CM | POA: Diagnosis not present

## 2019-12-09 DIAGNOSIS — N390 Urinary tract infection, site not specified: Secondary | ICD-10-CM | POA: Diagnosis not present
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
## 2019-12-22 ENCOUNTER — Encounter: Payer: Self-pay | Admitting: Neurology

## 2019-12-22 ENCOUNTER — Other Ambulatory Visit: Payer: Medicare Other

## 2019-12-22 ENCOUNTER — Ambulatory Visit (INDEPENDENT_AMBULATORY_CARE_PROVIDER_SITE_OTHER): Payer: Medicare Other | Admitting: Neurology

## 2019-12-22 ENCOUNTER — Other Ambulatory Visit: Payer: Self-pay

## 2019-12-22 VITALS — BP 118/60 | HR 96 | Temp 97.0°F | Ht 65.0 in | Wt 154.3 lb

## 2019-12-22 DIAGNOSIS — G2581 Restless legs syndrome: Secondary | ICD-10-CM | POA: Diagnosis not present

## 2019-12-22 DIAGNOSIS — E1140 Type 2 diabetes mellitus with diabetic neuropathy, unspecified: Secondary | ICD-10-CM

## 2019-12-22 DIAGNOSIS — Z794 Long term (current) use of insulin: Secondary | ICD-10-CM

## 2019-12-22 MED ORDER — LEVETIRACETAM 250 MG PO TABS: 250.0000 mg | ORAL_TABLET | Freq: Every day | ORAL | 3 refills | Status: DC

## 2019-12-22 NOTE — Patient Instructions (Signed)
Lets's stop the Lyrica  Continue taking the Mirapex  Try the Keppra 250 mg 1 hour before bedtime  Call me or send me a message how you are doing

## 2019-12-23 NOTE — Progress Notes (Signed)
I have read the note, and I agree with the clinical assessment and plan.  Khady Vandenberg K Kimbra Marcelino   

## 2019-12-28 ENCOUNTER — Ambulatory Visit: Payer: Medicare Other | Admitting: Endocrinology

## 2020-01-05 ENCOUNTER — Other Ambulatory Visit: Payer: Self-pay | Admitting: Cardiology

## 2020-01-05 MED ORDER — APIXABAN 2.5 MG PO TABS: 2.5000 mg | ORAL_TABLET | Freq: Two times a day (BID) | ORAL | 1 refills | Status: DC

## 2020-01-05 NOTE — Telephone Encounter (Signed)
Pt last saw Dr [name] 08/16/19, last labs 09/22/19 Creat 1.54, age 86, weight 70kg, based on age and consistently elevated Creat 1.4 on 08/06/19, and 07/27/19 Creat 1.54 pt should be on Eliquis 2.5mg  BID.  Will route message to Dr [name] to see if dosage reduction is appropriate.  Upon further review of pt's chart in [facility] last note on 08/16/19 and Mitzi Hansen Quinn's last note 11/17/19 states pt is on Eliquis 2.5mg  BID even though medication list states 5mg  BID and refill request was for 5mg  BID.  Eliquis 2.5mg  Bid is appropriate dosing based on age 69 and Creat 1.54 on 09/22/19.  Will send in refill for 2.5mg  tablets and will correct medication list to reflect correct dosing.

## 2020-01-06 DIAGNOSIS — R112 Nausea with vomiting, unspecified: Secondary | ICD-10-CM | POA: Diagnosis not present

## 2020-01-06 DIAGNOSIS — R198 Other specified symptoms and signs involving the digestive system and abdomen: Secondary | ICD-10-CM | POA: Diagnosis not present

## 2020-01-11 ENCOUNTER — Other Ambulatory Visit: Payer: Self-pay | Admitting: Endocrinology

## 2020-01-11 ENCOUNTER — Other Ambulatory Visit: Payer: Self-pay | Admitting: Neurology

## 2020-01-11 DIAGNOSIS — N183 Chronic kidney disease, stage 3 unspecified: Secondary | ICD-10-CM

## 2020-01-11 DIAGNOSIS — Z794 Long term (current) use of insulin: Secondary | ICD-10-CM

## 2020-01-11 DIAGNOSIS — E1165 Type 2 diabetes mellitus with hyperglycemia: Secondary | ICD-10-CM

## 2020-01-11 MED ORDER — LEVETIRACETAM 250 MG PO TABS: 250.0000 mg | ORAL_TABLET | Freq: Every day | ORAL | 1 refills | Status: DC

## 2020-01-11 NOTE — Telephone Encounter (Signed)
Please advise if you are agreeable to this request

## 2020-01-13 ENCOUNTER — Other Ambulatory Visit (INDEPENDENT_AMBULATORY_CARE_PROVIDER_SITE_OTHER): Payer: Medicare Other

## 2020-01-13 ENCOUNTER — Other Ambulatory Visit: Payer: Self-pay

## 2020-01-13 DIAGNOSIS — E1165 Type 2 diabetes mellitus with hyperglycemia: Secondary | ICD-10-CM | POA: Diagnosis not present

## 2020-01-13 DIAGNOSIS — Z794 Long term (current) use of insulin: Secondary | ICD-10-CM | POA: Diagnosis not present

## 2020-01-13 DIAGNOSIS — N183 Chronic kidney disease, stage 3 unspecified: Secondary | ICD-10-CM | POA: Diagnosis not present

## 2020-01-13 LAB — CBC
HCT: 41.4 % (ref 36.0–46.0)
Hemoglobin: 13.5 g/dL (ref 12.0–15.0)
MCHC: 32.7 g/dL (ref 30.0–36.0)
MCV: 84.7 fl (ref 78.0–100.0)
Platelets: 224 10*3/uL (ref 150.0–400.0)
RBC: 4.89 Mil/uL (ref 3.87–5.11)
RDW: 15 % (ref 11.5–15.5)
WBC: 6.6 10*3/uL (ref 4.0–10.5)

## 2020-01-13 LAB — COMPREHENSIVE METABOLIC PANEL
ALT: 13 U/L (ref 0–35)
AST: 13 U/L (ref 0–37)
Albumin: 4.2 g/dL (ref 3.5–5.2)
Alkaline Phosphatase: 54 U/L (ref 39–117)
BUN: 30 mg/dL — ABNORMAL HIGH (ref 6–23)
CO2: 30 mEq/L (ref 19–32)
Calcium: 9.5 mg/dL (ref 8.4–10.5)
Chloride: 102 mEq/L (ref 96–112)
Creatinine, Ser: 1.59 mg/dL — ABNORMAL HIGH (ref 0.40–1.20)
GFR: 30.52 mL/min — ABNORMAL LOW (ref >60.00)
Glucose, Bld: 131 mg/dL — ABNORMAL HIGH (ref 70–99)
Potassium: 3.8 mEq/L (ref 3.5–5.1)
Sodium: 137 mEq/L (ref 135–145)
Total Bilirubin: 0.4 mg/dL (ref 0.2–1.2)
Total Protein: 7.2 g/dL (ref 6.0–8.3)

## 2020-01-13 LAB — HEMOGLOBIN A1C: Hgb A1c MFr Bld: 7.1 % — ABNORMAL HIGH (ref 4.6–6.5)

## 2020-01-13 MED ORDER — OZEMPIC (0.25 OR 0.5 MG/DOSE) 2 MG/1.5ML ~~LOC~~ SOPN: 0.5000 mg | PEN_INJECTOR | SUBCUTANEOUS | 1 refills | Status: DC

## 2020-01-15 ENCOUNTER — Other Ambulatory Visit: Payer: Self-pay | Admitting: Endocrinology

## 2020-01-17 ENCOUNTER — Other Ambulatory Visit (HOSPITAL_COMMUNITY)
Admission: RE | Admit: 2020-01-17 | Discharge: 2020-01-17 | Disposition: A | Discharge status: Home / Self Care | Payer: Medicare Other | Source: Ambulatory Visit | Attending: Pulmonary Disease | Admitting: Pulmonary Disease

## 2020-01-17 ENCOUNTER — Ambulatory Visit (INDEPENDENT_AMBULATORY_CARE_PROVIDER_SITE_OTHER): Payer: Medicare Other | Admitting: Endocrinology

## 2020-01-17 ENCOUNTER — Other Ambulatory Visit: Payer: Self-pay

## 2020-01-17 ENCOUNTER — Encounter: Payer: Self-pay | Admitting: Endocrinology

## 2020-01-17 ENCOUNTER — Ambulatory Visit: Payer: Medicare Other | Admitting: Endocrinology

## 2020-01-17 ENCOUNTER — Other Ambulatory Visit: Payer: Self-pay | Admitting: Neurology

## 2020-01-17 VITALS — BP 122/56 | HR 100 | Ht 65.0 in | Wt 154.8 lb

## 2020-01-17 DIAGNOSIS — E039 Hypothyroidism, unspecified: Secondary | ICD-10-CM | POA: Diagnosis not present

## 2020-01-17 DIAGNOSIS — N289 Disorder of kidney and ureter, unspecified: Secondary | ICD-10-CM | POA: Diagnosis not present

## 2020-01-17 DIAGNOSIS — E1165 Type 2 diabetes mellitus with hyperglycemia: Secondary | ICD-10-CM | POA: Diagnosis not present

## 2020-01-17 DIAGNOSIS — Z01812 Encounter for preprocedural laboratory examination: Secondary | ICD-10-CM | POA: Insufficient documentation

## 2020-01-17 DIAGNOSIS — E1142 Type 2 diabetes mellitus with diabetic polyneuropathy: Secondary | ICD-10-CM

## 2020-01-17 DIAGNOSIS — Z20822 Contact with and (suspected) exposure to covid-19: Secondary | ICD-10-CM | POA: Diagnosis not present

## 2020-01-17 DIAGNOSIS — E782 Mixed hyperlipidemia: Secondary | ICD-10-CM

## 2020-01-17 DIAGNOSIS — Z794 Long term (current) use of insulin: Secondary | ICD-10-CM | POA: Diagnosis not present

## 2020-01-17 LAB — SARS CORONAVIRUS 2 (TAT 6-24 HRS): SARS Coronavirus 2: NEGATIVE

## 2020-01-17 NOTE — Patient Instructions (Addendum)
Take 22 units levemir  No ozempic for now
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
## 2020-01-18 ENCOUNTER — Other Ambulatory Visit: Payer: Self-pay | Admitting: *Deleted

## 2020-01-18 DIAGNOSIS — J479 Bronchiectasis, uncomplicated: Secondary | ICD-10-CM

## 2020-01-19 ENCOUNTER — Telehealth: Payer: Self-pay | Admitting: *Deleted

## 2020-01-19 ENCOUNTER — Other Ambulatory Visit: Payer: Self-pay | Admitting: *Deleted

## 2020-01-19 ENCOUNTER — Other Ambulatory Visit: Payer: Self-pay

## 2020-01-19 ENCOUNTER — Ambulatory Visit (INDEPENDENT_AMBULATORY_CARE_PROVIDER_SITE_OTHER): Payer: Medicare Other | Admitting: Pulmonary Disease

## 2020-01-19 DIAGNOSIS — J479 Bronchiectasis, uncomplicated: Secondary | ICD-10-CM

## 2020-01-19 LAB — PULMONARY FUNCTION TEST
DL/VA % pred: 94 %
DL/VA: 3.76 ml/min/mmHg/L
DLCO cor % pred: 73 %
DLCO cor: 13.88 ml/min/mmHg
DLCO unc % pred: 73 %
DLCO unc: 13.92 ml/min/mmHg
FEF 25-75 Pre: 0.67 L/sec
FEF2575-%Pred-Pre: 68 %
FEV1-%Pred-Pre: 93 %
FEV1-Pre: 1.59 L
FEV1FVC-%Pred-Pre: 93 %
FEV6-%Pred-Pre: 91 %
FEV6-Pre: 1.99 L
FEV6FVC-%Pred-Pre: 90 %
FVC-%Pred-Pre: 101 %
FVC-Pre: 2.36 L
Pre FEV1/FVC ratio: 67 %
Pre FEV6/FVC Ratio: 84 %
RV % pred: 83 %
RV: 2.19 L
TLC % pred: 84 %
TLC: 4.38 L

## 2020-01-19 MED ORDER — BUDESONIDE 0.25 MG/2ML IN SUSP: 0.2500 mg | Freq: Two times a day (BID) | RESPIRATORY_TRACT | 12 refills | Status: DC | PRN

## 2020-01-19 NOTE — Progress Notes (Signed)
Per Dr. Carlis Abbott, to help with pt's allergies on a short term as needed basis, start budesonide bid prn wheezing.  Pt to take the budesonide now instead of the xopenex neb sol. This will be a short term med for allergies as needed.  Verified pt's preferred pharmacy and also have sent med to pharmacy for pt.

## 2020-01-19 NOTE — Telephone Encounter (Signed)
Today during her PFT Melissa [name] had a severe reaction to Levalbuterol administered for her bronchodilator challenge.  She developed severe tremors of her entire body.  She was awake, able to speak, not short of breath, and able to follow commands during these episodes.  After about 20 minutes her tremors improved and she complained of only a headache from the muscle rigidity.  She was able to drink water.  He was observed for about 30 minutes with her daughter prior to discharge.  She has resting tremors and is on metoprolol at home.  She took her morning dose of metoprolol today.  She gets shaking at home with leave albuterol, but not usually this severe.  She uses Atrovent as her first line inhaler.  Discontinue Xopenex.  Short-term can use budesonide 0.25 nebulized twice daily as needed for worse symptoms associated with allergies.  Discussed the chronic bronchiectasis we would like to minimize ICS is much as possible.  Continue Atrovent as first-line rescue inhaler.  ER precautions were given to her daughter.  If she is unable to take her medications, eat, swallow, or develops shortness of breath she should go to the emergency department.  He is albuterol would be expected to wear off within a few hours, and her symptoms should not worsen after initially improving.  Julian Hy, DO 01/19/20 5:30 PM [facility]

## 2020-01-19 NOTE — Progress Notes (Signed)
Spiro/DLCO and Pleth performed today. Due to extreme trimmer patient was not able to complete post spirometry.

## 2020-01-19 NOTE — Telephone Encounter (Signed)
Per Dr. Carlis Abbott, to help with pt's allergies on a short term as needed basis, start budesonide bid prn wheezing.  Pt to take the budesonide now instead of the xopenex neb sol. This will be a short term med for allergies as needed.  Verified pt's preferred pharmacy and also have sent med to pharmacy for pt. I also have scheduled pt a f/u with Wyn Quaker 4/16 at 3:00 due to pt needing an afternoon appt.  Routing this to Dr. Carlis Abbott so she can do further documentation.

## 2020-01-25 ENCOUNTER — Other Ambulatory Visit: Payer: Self-pay | Admitting: Pulmonary Disease

## 2020-01-27 ENCOUNTER — Other Ambulatory Visit: Payer: Self-pay | Admitting: *Deleted

## 2020-01-27 MED ORDER — APIXABAN 5 MG PO TABS: 5.0000 mg | ORAL_TABLET | Freq: Two times a day (BID) | ORAL | 3 refills | Status: DC

## 2020-01-27 NOTE — Progress Notes (Signed)
Per Dr [name] orders after review - pt should be taking 5 mg po BID of Eliquis.  Rx sent into [facility].

## 2020-01-28 ENCOUNTER — Telehealth: Payer: Self-pay | Admitting: *Deleted

## 2020-01-28 ENCOUNTER — Encounter: Payer: Self-pay | Admitting: Pulmonary Disease

## 2020-01-28 ENCOUNTER — Ambulatory Visit (INDEPENDENT_AMBULATORY_CARE_PROVIDER_SITE_OTHER): Payer: Medicare Other | Admitting: Pulmonary Disease

## 2020-01-28 ENCOUNTER — Other Ambulatory Visit: Payer: Self-pay

## 2020-01-28 VITALS — BP 106/56 | HR 95 | Temp 97.3°F | Ht 64.0 in | Wt 152.8 lb

## 2020-01-28 DIAGNOSIS — J449 Chronic obstructive pulmonary disease, unspecified: Secondary | ICD-10-CM | POA: Diagnosis not present

## 2020-01-28 DIAGNOSIS — J479 Bronchiectasis, uncomplicated: Secondary | ICD-10-CM | POA: Diagnosis not present

## 2020-01-28 DIAGNOSIS — I5022 Chronic systolic (congestive) heart failure: Secondary | ICD-10-CM | POA: Diagnosis not present

## 2020-01-28 MED ORDER — PERFOROMIST 20 MCG/2ML IN NEBU: 20.0000 ug | INHALATION_SOLUTION | Freq: Two times a day (BID) | RESPIRATORY_TRACT | 0 refills | Status: DC

## 2020-01-28 MED ORDER — YUPELRI 175 MCG/3ML IN SOLN: 175.0000 ug | Freq: Every day | RESPIRATORY_TRACT | 0 refills | Status: DC

## 2020-01-28 NOTE — Assessment & Plan Note (Signed)
Plan: Start Yupelri nebulized meds daily Start performance nebulized meds twice daily Can use Pulmicort nebulized meds every 12 hours as needed for significant shortness of breath and wheezing Stop Incruse Ellipta Notify our office in 1 to 2 weeks how you are doing on the nebulized meds samples that we have provided today, if you prefer this route we can send in a prescription Order for more nebulizer equipment Follow-up in 4 weeks

## 2020-01-28 NOTE — Patient Instructions (Addendum)
You were seen today by Lauraine Rinne, NP  for:   1. Bronchiectasis without complication ([place])  Sputum sample today   Bronchiectasis: This is the medical term which indicates that you have damage, dilated airways making you more susceptible to respiratory infection. Use a flutter valve 10 breaths twice a day or 4 to 5 breaths 4-5 times a day to help clear mucus out Let us know if you have cough with change in mucus color or fevers or chills.  At that point you would need an antibiotic. Maintain a healthy nutritious diet, eating whole foods Take your medications as prescribed   2. Chronic obstructive pulmonary disease, unspecified COPD type ([place])  Stop incruse   Start Perforomist nebulized meds every 12 hours Rinse mouth out after use  Start Yupelri nebulized meds daily Rinse mouth out after use  Okay to use Pulmicort nebs every 12 hours as needed for significant wheezing or shortness of breath  Samples provided today, notify our office in 1 week how you are doing on these nebulized meds  We have placed an order for more nebulizer equipment  3. Chronic systolic heart failure ([place])  Keep follow-up with cardiology   Follow Up:    Return in about 4 weeks (around 02/25/2020), or if symptoms worsen or fail to improve, for Follow up with Dr. Loanne Drilling, Follow up with Wyn Quaker FNP-C.   Please do your part to reduce the spread of COVID-19:      Reduce your risk of any infection  and COVID19 by using the similar precautions used for avoiding the common cold or flu:  Marland Kitchen Wash your hands often with soap and warm water for at least 20 seconds.  If soap and water are not readily available, use an alcohol-based hand sanitizer with at least 60% alcohol.  . If coughing or sneezing, cover your mouth and nose by coughing or sneezing into the elbow areas of your shirt or coat, into a tissue or into your sleeve (not your hands). Langley Gauss A MASK when in public  . Avoid shaking hands with others  and consider head nods or verbal greetings only. . Avoid touching your eyes, nose, or mouth with unwashed hands.  . Avoid close contact with people who are sick. . Avoid places or events with large numbers of people in one location, like concerts or sporting events. . If you have some symptoms but not all symptoms, continue to monitor at home and seek medical attention if your symptoms worsen. . If you are having a medical emergency, call 911.   [place] / e-Visit: [email or web address]         [facility]: [facility]: [phone]                   [facility]: [phone]     It is flu season:   >>> Best ways to protect herself from the flu: Receive the yearly flu vaccine, practice good hand hygiene washing with soap and also using hand sanitizer when available, eat a nutritious meals, get adequate rest, hydrate appropriately   Please contact the office if your symptoms worsen or you have concerns that you are not improving.   Thank you for choosing [facility] for your healthcare, and for allowing [place] to partner with you on your healthcare journey. I am thankful to be able to provide care to you today.  Wyn Quaker FNP-C

## 2020-01-28 NOTE — Addendum Note (Signed)
Addended by: Vanessa Barbara on: 01/28/2020 05:35 PM   Modules accepted: Orders

## 2020-01-28 NOTE — Addendum Note (Signed)
Addended by: Vanessa Barbara on: 01/28/2020 05:24 PM   Modules accepted: Orders

## 2020-01-28 NOTE — Assessment & Plan Note (Signed)
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
## 2020-01-31 NOTE — Telephone Encounter (Signed)
Patient sent Email stating [facility], so I will send in order or new nebulizer and supplies.

## 2020-02-02 ENCOUNTER — Other Ambulatory Visit: Payer: Medicare Other

## 2020-02-02 DIAGNOSIS — J479 Bronchiectasis, uncomplicated: Secondary | ICD-10-CM | POA: Diagnosis not present

## 2020-02-04 ENCOUNTER — Other Ambulatory Visit: Payer: Self-pay

## 2020-02-04 MED ORDER — FLUTTER DEVI: 1.0000 | 0 refills | Status: DC | PRN

## 2020-02-06 DIAGNOSIS — J189 Pneumonia, unspecified organism: Secondary | ICD-10-CM | POA: Diagnosis not present

## 2020-02-06 DIAGNOSIS — R05 Cough: Secondary | ICD-10-CM | POA: Diagnosis not present

## 2020-02-06 DIAGNOSIS — R0989 Other specified symptoms and signs involving the circulatory and respiratory systems: Secondary | ICD-10-CM | POA: Diagnosis not present

## 2020-02-06 DIAGNOSIS — Z20822 Contact with and (suspected) exposure to covid-19: Secondary | ICD-10-CM | POA: Diagnosis not present

## 2020-02-08 ENCOUNTER — Encounter: Payer: Self-pay | Admitting: Adult Health

## 2020-02-08 ENCOUNTER — Telehealth: Payer: Self-pay | Admitting: Internal Medicine

## 2020-02-08 ENCOUNTER — Ambulatory Visit (INDEPENDENT_AMBULATORY_CARE_PROVIDER_SITE_OTHER): Payer: Medicare Other | Admitting: Adult Health

## 2020-02-08 ENCOUNTER — Other Ambulatory Visit: Payer: Self-pay | Admitting: *Deleted

## 2020-02-08 ENCOUNTER — Other Ambulatory Visit: Payer: Self-pay

## 2020-02-08 ENCOUNTER — Other Ambulatory Visit (INDEPENDENT_AMBULATORY_CARE_PROVIDER_SITE_OTHER): Payer: Medicare Other

## 2020-02-08 ENCOUNTER — Ambulatory Visit (INDEPENDENT_AMBULATORY_CARE_PROVIDER_SITE_OTHER): Payer: Medicare Other

## 2020-02-08 VITALS — BP 146/64 | HR 76 | Temp 97.1°F | Ht 65.0 in | Wt 156.0 lb

## 2020-02-08 DIAGNOSIS — R079 Chest pain, unspecified: Secondary | ICD-10-CM

## 2020-02-08 DIAGNOSIS — J479 Bronchiectasis, uncomplicated: Secondary | ICD-10-CM

## 2020-02-08 DIAGNOSIS — R0600 Dyspnea, unspecified: Secondary | ICD-10-CM

## 2020-02-08 DIAGNOSIS — I50813 Acute on chronic right heart failure: Secondary | ICD-10-CM

## 2020-02-08 DIAGNOSIS — I5022 Chronic systolic (congestive) heart failure: Secondary | ICD-10-CM | POA: Diagnosis not present

## 2020-02-08 LAB — CBC WITH DIFFERENTIAL/PLATELET
Basophils Absolute: 0.1 10*3/uL (ref 0.0–0.1)
Basophils Relative: 1.2 % (ref 0.0–3.0)
Eosinophils Absolute: 0.2 10*3/uL (ref 0.0–0.7)
Eosinophils Relative: 1.7 % (ref 0.0–5.0)
HCT: 35.3 % — ABNORMAL LOW (ref 36.0–46.0)
Hemoglobin: 11.9 g/dL — ABNORMAL LOW (ref 12.0–15.0)
Lymphocytes Relative: 26.6 % (ref 12.0–46.0)
Lymphs Abs: 2.7 10*3/uL (ref 0.7–4.0)
MCHC: 33.7 g/dL (ref 30.0–36.0)
MCV: 83.9 fl (ref 78.0–100.0)
Monocytes Absolute: 0.8 10*3/uL (ref 0.1–1.0)
Monocytes Relative: 7.7 % (ref 3.0–12.0)
Neutro Abs: 6.4 10*3/uL (ref 1.4–7.7)
Neutrophils Relative %: 62.8 % (ref 43.0–77.0)
Platelets: 266 10*3/uL (ref 150.0–400.0)
RBC: 4.21 Mil/uL (ref 3.87–5.11)
RDW: 14.6 % (ref 11.5–15.5)
WBC: 10.2 10*3/uL (ref 4.0–10.5)

## 2020-02-08 LAB — BASIC METABOLIC PANEL
BUN: 21 mg/dL (ref 6–23)
CO2: 32 mEq/L (ref 19–32)
Calcium: 9.1 mg/dL (ref 8.4–10.5)
Chloride: 98 mEq/L (ref 96–112)
Creatinine, Ser: 1.28 mg/dL — ABNORMAL HIGH (ref 0.40–1.20)
GFR: 39.19 mL/min — ABNORMAL LOW (ref >60.00)
Glucose, Bld: 173 mg/dL — ABNORMAL HIGH (ref 70–99)
Potassium: 2.9 mEq/L — ABNORMAL LOW (ref 3.5–5.1)
Sodium: 137 mEq/L (ref 135–145)

## 2020-02-08 LAB — TROPONIN I (HIGH SENSITIVITY): High Sens Troponin I: 10 ng/L (ref 2–17)

## 2020-02-08 LAB — BRAIN NATRIURETIC PEPTIDE: Pro B Natriuretic peptide (BNP): 257 pg/mL — ABNORMAL HIGH (ref 0.0–100.0)

## 2020-02-08 MED ORDER — POTASSIUM CHLORIDE CRYS ER 20 MEQ PO TBCR: 20.0000 meq | EXTENDED_RELEASE_TABLET | Freq: Every day | ORAL | 0 refills | Status: DC

## 2020-02-08 NOTE — Addendum Note (Signed)
Addended by: June Leap on: 02/08/2020 02:31 PM   Modules accepted: Orders

## 2020-02-08 NOTE — Assessment & Plan Note (Addendum)
Flare chest x-ray shows no sign of acute pneumonia.  Will continue on antibiotics and prednisone as directed.  Advised to take antibiotic and prednisone with food as she had taken on empty stomach yesterday. Follow-up in 2 to 3 weeks as planned  Plan  Patient Instructions  Finish Doxycycline and Prednisone (take with food) .  Mucinex DM Twice daily  As needed  Cough/congestion  Labs today .  If your chest pain returns you are to call 911 or go to ER immediately .  Continue on Protonix daily  Flutter valve Twice daily  .  Continue on Budesoinde/Perforomist Neb Twice daily   Continue on [employer] daily.  Follow up with Cardiology as planned in 2 weeks and As needed   Follow up with Dr. Loanne Drilling in 3 weeks as planned and As needed   Please contact office for sooner follow up if symptoms do not improve or worsen or seek emergency care
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
## 2020-02-08 NOTE — Telephone Encounter (Signed)
Spoke with pt's daughter, [name]. States that pt was diagnosed with PNA yesterday at an urgent care. Pt was given Doxy and prednisone. [name] reports that the pt has been complaining of pain under her left breast. She requested that the pt be scheduled for an in office appointment. Pt has been scheduled to see Tammy today at 1200. Nothing further was needed.

## 2020-02-08 NOTE — Patient Instructions (Addendum)
Finish Doxycycline and Prednisone (take with food) .  Mucinex DM Twice daily  As needed  Cough/congestion  Labs today .  If your chest pain returns you are to call 911 or go to ER immediately .  Continue on Protonix daily  Flutter valve Twice daily  .  Continue on Budesoinde/Perforomist Neb Twice daily   Continue on [employer] daily.  Follow up with Cardiology as planned in 2 weeks and As needed   Follow up with Dr. Loanne Drilling in 3 weeks as planned and As needed   Please contact office for sooner follow up if symptoms do not improve or worsen or seek emergency care

## 2020-02-08 NOTE — Assessment & Plan Note (Signed)
Appears euvolemic on exam. Check labs today. Continue on current regimen.  Advised to follow-up with cardiology in 2 weeks as planned.

## 2020-02-08 NOTE — Assessment & Plan Note (Addendum)
Left Sided Chest pain - ? Etiology  CXR and  EKG with no acute changes .  No current chest pain x  1hr.  Advised patient and daughter that if her pain returns she needs to go to immediately to the emergency room. We will check lab work today CBC be met BNP troponin.    Plan  Patient Instructions  Finish Doxycycline and Prednisone (take with food) .  Mucinex DM Twice daily  As needed  Cough/congestion  Labs today .  If your chest pain returns you are to call 911 or go to ER immediately .  Continue on Protonix daily  Flutter valve Twice daily  .  Continue on Budesoinde/Perforomist Neb Twice daily   Continue on [employer] daily.  Follow up with Cardiology as planned in 2 weeks and As needed   Follow up with Dr. Loanne Drilling in 3 weeks as planned and As needed   Please contact office for sooner follow up if symptoms do not improve or worsen or seek emergency care

## 2020-02-21 ENCOUNTER — Other Ambulatory Visit: Payer: Self-pay

## 2020-02-21 ENCOUNTER — Ambulatory Visit (INDEPENDENT_AMBULATORY_CARE_PROVIDER_SITE_OTHER): Payer: Medicare Other | Admitting: Cardiology

## 2020-02-21 ENCOUNTER — Encounter: Payer: Self-pay | Admitting: Cardiology

## 2020-02-21 VITALS — BP 140/60 | HR 65 | Ht 65.0 in | Wt 155.0 lb

## 2020-02-21 DIAGNOSIS — I428 Other cardiomyopathies: Secondary | ICD-10-CM

## 2020-02-21 DIAGNOSIS — I480 Paroxysmal atrial fibrillation: Secondary | ICD-10-CM | POA: Diagnosis not present

## 2020-02-21 DIAGNOSIS — Z794 Long term (current) use of insulin: Secondary | ICD-10-CM

## 2020-02-21 DIAGNOSIS — E1165 Type 2 diabetes mellitus with hyperglycemia: Secondary | ICD-10-CM

## 2020-02-21 MED ORDER — NOVOLOG FLEXPEN 100 UNIT/ML ~~LOC~~ SOPN: 6.0000 [IU] | PEN_INJECTOR | Freq: Every day | SUBCUTANEOUS | 2 refills | Status: DC

## 2020-02-21 MED ORDER — LEVEMIR FLEXTOUCH 100 UNIT/ML ~~LOC~~ SOPN: 28.0000 [IU] | PEN_INJECTOR | Freq: Every day | SUBCUTANEOUS | 2 refills | Status: DC
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
## 2020-02-21 NOTE — Patient Instructions (Signed)

## 2020-02-22 ENCOUNTER — Other Ambulatory Visit: Payer: Self-pay

## 2020-02-22 MED ORDER — INSULIN LISPRO (1 UNIT DIAL) 100 UNIT/ML (KWIKPEN): 6.0000 [IU] | PEN_INJECTOR | Freq: Every day | SUBCUTANEOUS | 1 refills | Status: DC

## 2020-02-24 ENCOUNTER — Other Ambulatory Visit: Payer: Self-pay

## 2020-02-24 MED ORDER — HUMALOG KWIKPEN 100 UNIT/ML ~~LOC~~ SOPN: PEN_INJECTOR | SUBCUTANEOUS | 1 refills | Status: DC
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
## 2020-02-25 ENCOUNTER — Encounter: Payer: Self-pay | Admitting: Pulmonary Disease

## 2020-02-25 ENCOUNTER — Ambulatory Visit (INDEPENDENT_AMBULATORY_CARE_PROVIDER_SITE_OTHER): Payer: Medicare Other | Admitting: Pulmonary Disease

## 2020-02-25 ENCOUNTER — Other Ambulatory Visit: Payer: Self-pay

## 2020-02-25 VITALS — BP 110/72 | HR 94 | Temp 97.1°F | Ht 65.0 in | Wt 154.6 lb

## 2020-02-25 DIAGNOSIS — E876 Hypokalemia: Secondary | ICD-10-CM

## 2020-02-25 DIAGNOSIS — J479 Bronchiectasis, uncomplicated: Secondary | ICD-10-CM

## 2020-02-25 DIAGNOSIS — R0602 Shortness of breath: Secondary | ICD-10-CM

## 2020-02-25 DIAGNOSIS — J449 Chronic obstructive pulmonary disease, unspecified: Secondary | ICD-10-CM | POA: Diagnosis not present

## 2020-02-25 DIAGNOSIS — R3915 Urgency of urination: Secondary | ICD-10-CM

## 2020-02-25 MED ORDER — LEVOFLOXACIN 500 MG PO TABS: 500.0000 mg | ORAL_TABLET | Freq: Every day | ORAL | 0 refills | Status: DC

## 2020-02-25 NOTE — Assessment & Plan Note (Signed)
Plan: Continue Yupelri Continue Perforomist Continue Pulmicort Levaquin today Close follow-up in 7 to 10 days with our office

## 2020-02-25 NOTE — Assessment & Plan Note (Signed)
Recent hypokalemia on blood work Treated with potassium  Plan: We will need to obtain baseline blood work next week or patient can have this completed with primary care Unable to complete today as outpatient labs are closed

## 2020-02-25 NOTE — Patient Instructions (Addendum)
You were seen today by Lauraine Rinne, NP  for:   1. Bronchiectasis without complication (HCC)  - levofloxacin (LEVAQUIN) 500 MG tablet; Take 1 tablet (500 mg total) by mouth daily.  Dispense: 7 tablet; Refill: 0  Sputum Cultures - 3 ways   [place]  Bronchiectasis: This is the medical term which indicates that you have damage, dilated airways making you more susceptible to respiratory infection. Use a flutter valve 10 breaths twice a day or 4 to 5 breaths 4-5 times a day to help clear mucus out Let us know if you have cough with change in mucus color or fevers or chills.  At that point you would need an antibiotic. Maintain a healthy nutritious diet, eating whole foods Take your medications as prescribed    2. Chronic obstructive pulmonary disease, unspecified COPD type ([place])  Continue nebulized meds    Please schedule follow-up with primary care  You need repeat labs to check your potassium levels, I have placed these labs if primary care is unable to see you next week and please present to the [place] lab to get these completed  If symptoms worsen you need to seek emergent evaluation  We recommend today:   Meds ordered this encounter  Medications  . levofloxacin (LEVAQUIN) 500 MG tablet    Sig: Take 1 tablet (500 mg total) by mouth daily.    Dispense:  7 tablet    Refill:  0    Follow Up:    Return in about 1 week (around 03/03/2020), or if symptoms worsen or fail to improve, for Follow up with Wyn Quaker FNP-C.   Please do your part to reduce the spread of COVID-19:      Reduce your risk of any infection  and COVID19 by using the similar precautions used for avoiding the common cold or flu:  Marland Kitchen Wash your hands often with soap and warm water for at least 20 seconds.  If soap and water are not readily available, use an alcohol-based hand sanitizer with at least 60% alcohol.  . If coughing or sneezing, cover your mouth and nose by coughing or sneezing into the  elbow areas of your shirt or coat, into a tissue or into your sleeve (not your hands). Langley Gauss A MASK when in public  . Avoid shaking hands with others and consider head nods or verbal greetings only. . Avoid touching your eyes, nose, or mouth with unwashed hands.  . Avoid close contact with people who are sick. . Avoid places or events with large numbers of people in one location, like concerts or sporting events. . If you have some symptoms but not all symptoms, continue to monitor at home and seek medical attention if your symptoms worsen. . If you are having a medical emergency, call 911.   [place] / e-Visit: [email or web address]         [facility]: [facility]: [phone]                   [facility]: [phone]     It is flu season:   >>> Best ways to protect herself from the flu: Receive the yearly flu vaccine, practice good hand hygiene washing with soap and also using hand sanitizer when available, eat a nutritious meals, get adequate rest, hydrate appropriately   Please contact the office if your symptoms worsen or you have  concerns that you are not improving.   Thank you for choosing [facility] for your healthcare, and for allowing [place] to partner with you on your healthcare journey. I am thankful to be able to provide care to you today.   Wyn Quaker FNP-C

## 2020-02-25 NOTE — Assessment & Plan Note (Signed)
History of chronic UTIs Status post doxycycline Patient reporting urgency with urination No recent evaluation with primary care Patient reporting difficulty to obtain evaluation from primary care  Plan: Unfortunately unable to obtain urine sample Patient appears acutely ill We will empirically treat with Levaquin

## 2020-02-25 NOTE — Assessment & Plan Note (Addendum)
Status post doxycycline Improved symptoms on antibiotics Worsened fatigue, cough since stopping antibiotics 2 weeks ago x-ray was stable  Plan: We will treat with Levaquin today Sputum cultures - 3 ways  Close follow-up with our office in 7 to 10 days Emphasized importance that if symptom worsens patient needs to seek emergent care Patient needs to follow-up with primary care as well We will need to obtain baseline lab work next week as well as imaging on patient next week if symptoms or not improving Continue flutter valve May need to consider moving up high-resolution CT chest

## 2020-02-25 NOTE — Assessment & Plan Note (Signed)
Increase shortness of breath No audible wheezing on exam today  Plan: We will treat with Levaquin today Close follow-up with our office Patient is to follow-up with primary care

## 2020-02-28 ENCOUNTER — Telehealth: Payer: Self-pay | Admitting: Pulmonary Disease

## 2020-02-28 NOTE — Telephone Encounter (Signed)
02/28/2020  Contacted patient's daughter [name] to discuss patient.  She reports that patient is still very fatigued.  She is taking Levaquin.  She is eating and drinking.  No worsened fevers.  Reviewed symptoms of concern and if those develop to seek emergent evaluation and contact our office.  Patient has close follow-up scheduled with our office on 03/06/2020.  Wyn Quaker, FNP

## 2020-02-29 ENCOUNTER — Telehealth: Payer: Self-pay

## 2020-02-29 NOTE — Telephone Encounter (Signed)
Prescription clearly states 6 units Humalog before dinnertime.  Not clear which pharmacy is requesting more information

## 2020-02-29 NOTE — Telephone Encounter (Signed)
This office continues to receive clarification requests for this pt regarding how pt should take meal time insulin. Please clarify.

## 2020-03-01 ENCOUNTER — Other Ambulatory Visit: Payer: Medicare Other

## 2020-03-01 DIAGNOSIS — J479 Bronchiectasis, uncomplicated: Secondary | ICD-10-CM | POA: Diagnosis not present

## 2020-03-01 NOTE — Telephone Encounter (Signed)
Called pharmacy and they stated that the faxes have been sent by mistake, and the patient's medication has already been shipped.

## 2020-03-02 ENCOUNTER — Other Ambulatory Visit: Payer: Self-pay | Admitting: Adult Health

## 2020-03-06 ENCOUNTER — Encounter: Payer: Self-pay | Admitting: Pulmonary Disease

## 2020-03-06 ENCOUNTER — Ambulatory Visit (INDEPENDENT_AMBULATORY_CARE_PROVIDER_SITE_OTHER): Payer: Medicare Other | Admitting: Pulmonary Disease

## 2020-03-06 ENCOUNTER — Other Ambulatory Visit: Payer: Self-pay

## 2020-03-06 VITALS — BP 122/74 | HR 85 | Temp 98.4°F | Ht 65.0 in | Wt 152.0 lb

## 2020-03-06 DIAGNOSIS — B379 Candidiasis, unspecified: Secondary | ICD-10-CM | POA: Diagnosis not present

## 2020-03-06 DIAGNOSIS — J449 Chronic obstructive pulmonary disease, unspecified: Secondary | ICD-10-CM | POA: Diagnosis not present

## 2020-03-06 DIAGNOSIS — J479 Bronchiectasis, uncomplicated: Secondary | ICD-10-CM

## 2020-03-06 MED ORDER — YUPELRI 175 MCG/3ML IN SOLN: 175.0000 ug | Freq: Every day | RESPIRATORY_TRACT | 1 refills | Status: DC

## 2020-03-06 MED ORDER — PERFOROMIST 20 MCG/2ML IN NEBU: 20.0000 ug | INHALATION_SOLUTION | Freq: Two times a day (BID) | RESPIRATORY_TRACT | 1 refills | Status: DC

## 2020-03-06 MED ORDER — YUPELRI 175 MCG/3ML IN SOLN: 175.0000 ug | Freq: Every day | RESPIRATORY_TRACT | 0 refills | Status: DC

## 2020-03-06 MED ORDER — NYSTATIN 100000 UNIT/ML MT SUSP: 5.0000 mL | Freq: Four times a day (QID) | OROMUCOSAL | 0 refills | Status: DC
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
## 2020-03-06 NOTE — Patient Instructions (Addendum)
You were seen today by Lauraine Rinne, NP  For:   1. Bronchiectasis without complication (HCC)  Bronchiectasis: This is the medical term which indicates that you have damage, dilated airways making you more susceptible to respiratory infection. Use a flutter valve 10 breaths twice a day or 4 to 5 breaths 4-5 times a day to help clear mucus out Let us know if you have cough with change in mucus color or fevers or chills.  At that point you would need an antibiotic. Maintain a healthy nutritious diet, eating whole foods Take your medications as prescribed    2. Chronic obstructive pulmonary disease, unspecified COPD type ([place])  Continue Perforomist nebs twice daily Continue Yupelri nebs daily  Follow Up:    Return in about 6 weeks (around 04/17/2020), or if symptoms worsen or fail to improve, for Follow up with Wyn Quaker FNP-C, Follow up with Dr. Loanne Drilling.   Please do your part to reduce the spread of COVID-19:      Reduce your risk of any infection  and COVID19 by using the similar precautions used for avoiding the common cold or flu:  Marland Kitchen Wash your hands often with soap and warm water for at least 20 seconds.  If soap and water are not readily available, use an alcohol-based hand sanitizer with at least 60% alcohol.  . If coughing or sneezing, cover your mouth and nose by coughing or sneezing into the elbow areas of your shirt or coat, into a tissue or into your sleeve (not your hands). Langley Gauss A MASK when in public  . Avoid shaking hands with others and consider head nods or verbal greetings only. . Avoid touching your eyes, nose, or mouth with unwashed hands.  . Avoid close contact with people who are sick. . Avoid places or events with large numbers of people in one location, like concerts or sporting events. . If you have some symptoms but not all symptoms, continue to monitor at home and seek medical attention if your symptoms worsen. . If you are having a medical emergency, call  911.   [place] / e-Visit: [email or web address]         [facility]: [facility]: [phone]                   [facility]: [phone]     It is flu season:   >>> Best ways to protect herself from the flu: Receive the yearly flu vaccine, practice good hand hygiene washing with soap and also using hand sanitizer when available, eat a nutritious meals, get adequate rest, hydrate appropriately   Please contact the office if your symptoms worsen or you have concerns that you are not improving.   Thank you for choosing [facility] for your healthcare, and for allowing [place] to partner with you on your healthcare journey. I am thankful to be able to provide care to you today.   Wyn Quaker FNP-C

## 2020-03-06 NOTE — Assessment & Plan Note (Signed)
Plan: Continue Yupelri Continue performance nebs Yupelri samples provided today Hold Pulmicort, can use as needed for intermittent wheezing,

## 2020-03-06 NOTE — Assessment & Plan Note (Signed)
Improved symptoms status post Levaquin Repeat sputum culture showed scant fungal growth Patient feels that she is returning back to her baseline  Plan: Continue flutter valve Nystatin rinse Follow-up with our office in 6 weeks

## 2020-03-06 NOTE — Progress Notes (Signed)
Discussed with patient in office.  Will treat with nystatin.  Sent into pharmacy.Wyn Quaker, FNP

## 2020-03-09 ENCOUNTER — Ambulatory Visit: Payer: BLUE CROSS/BLUE SHIELD | Admitting: Family Medicine
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
## 2020-03-10 ENCOUNTER — Telehealth: Payer: Self-pay

## 2020-03-10 NOTE — Telephone Encounter (Signed)
Patient to be rescheduled from yesterdays epic outage. Patient can be scheduled as new patient at earliest convenience to her. Ok to override new patient per day.

## 2020-03-12 ENCOUNTER — Other Ambulatory Visit: Payer: Self-pay | Admitting: Cardiology

## 2020-03-12 ENCOUNTER — Other Ambulatory Visit: Payer: Self-pay | Admitting: Neurology

## 2020-03-19 LAB — AFB CULTURE WITH SMEAR (NOT AT ARMC)
Acid Fast Culture: NEGATIVE
Acid Fast Smear: NEGATIVE
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
## 2020-03-20 ENCOUNTER — Ambulatory Visit (INDEPENDENT_AMBULATORY_CARE_PROVIDER_SITE_OTHER): Payer: Medicare Other | Admitting: Family Medicine

## 2020-03-20 ENCOUNTER — Encounter: Payer: Self-pay | Admitting: Family Medicine

## 2020-03-20 ENCOUNTER — Other Ambulatory Visit: Payer: Self-pay

## 2020-03-20 VITALS — BP 110/68 | HR 77 | Temp 97.4°F | Resp 18 | Ht 64.5 in | Wt 150.0 lb

## 2020-03-20 DIAGNOSIS — Z794 Long term (current) use of insulin: Secondary | ICD-10-CM | POA: Diagnosis not present

## 2020-03-20 DIAGNOSIS — N183 Chronic kidney disease, stage 3 unspecified: Secondary | ICD-10-CM | POA: Diagnosis not present

## 2020-03-20 DIAGNOSIS — N390 Urinary tract infection, site not specified: Secondary | ICD-10-CM

## 2020-03-20 DIAGNOSIS — I5022 Chronic systolic (congestive) heart failure: Secondary | ICD-10-CM

## 2020-03-20 DIAGNOSIS — E1122 Type 2 diabetes mellitus with diabetic chronic kidney disease: Secondary | ICD-10-CM

## 2020-03-20 DIAGNOSIS — I10 Essential (primary) hypertension: Secondary | ICD-10-CM

## 2020-03-20 DIAGNOSIS — I4891 Unspecified atrial fibrillation: Secondary | ICD-10-CM | POA: Diagnosis not present

## 2020-03-20 NOTE — Patient Instructions (Addendum)
It was very nice to meet you today- please see me in about 2-3 months to recheck your A1c  Let's decrease your long acting insulin - Levemir- to about 15 unit a day.  We can let your A1c come up a bit, I want to make sure you don't have any low sugars!

## 2020-03-27 LAB — FUNGUS CULTURE W SMEAR
MICRO NUMBER:: 10496036
SMEAR:: NONE SEEN
SPECIMEN QUALITY:: ADEQUATE

## 2020-03-27 LAB — RESPIRATORY CULTURE OR RESPIRATORY AND SPUTUM CULTURE
MICRO NUMBER:: 10496037
RESULT:: NORMAL
SPECIMEN QUALITY:: ADEQUATE

## 2020-04-01 ENCOUNTER — Other Ambulatory Visit: Payer: Self-pay | Admitting: Adult Health

## 2020-04-14 ENCOUNTER — Other Ambulatory Visit: Payer: BLUE CROSS/BLUE SHIELD

## 2020-04-15 LAB — AFB CULTURE WITH SMEAR (NOT AT ARMC)
Acid Fast Culture: NEGATIVE
Acid Fast Smear: NEGATIVE

## 2020-04-16 IMAGING — CR DG CHEST 2V
2 series · 2 of 2 positions shown · non-contrast
Comparison: 07/20/2019

CLINICAL DATA: Bronchiectasis, shortness of breath and dry cough.

EXAM:
CHEST - 2 VIEW

[chest lat]
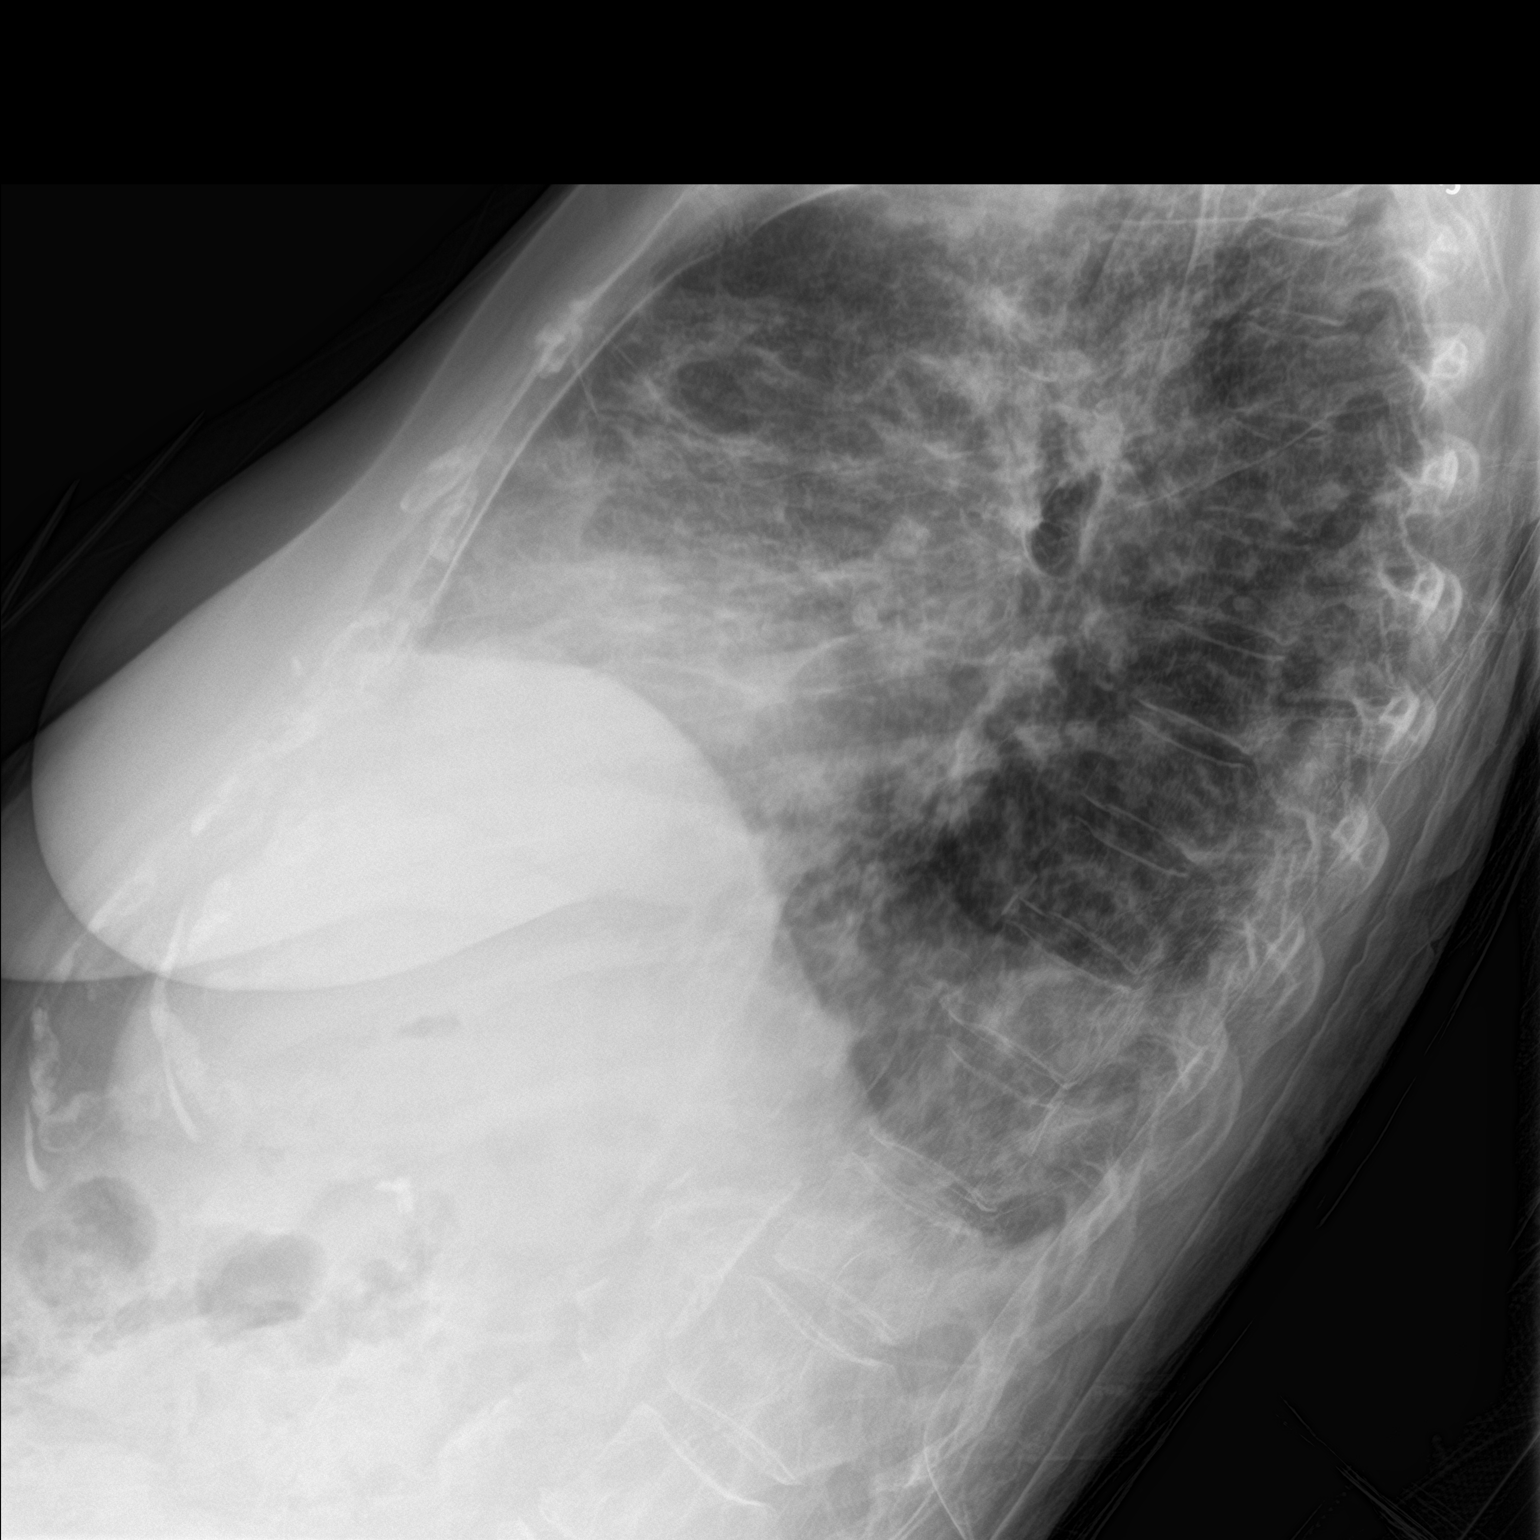

[chest ap]
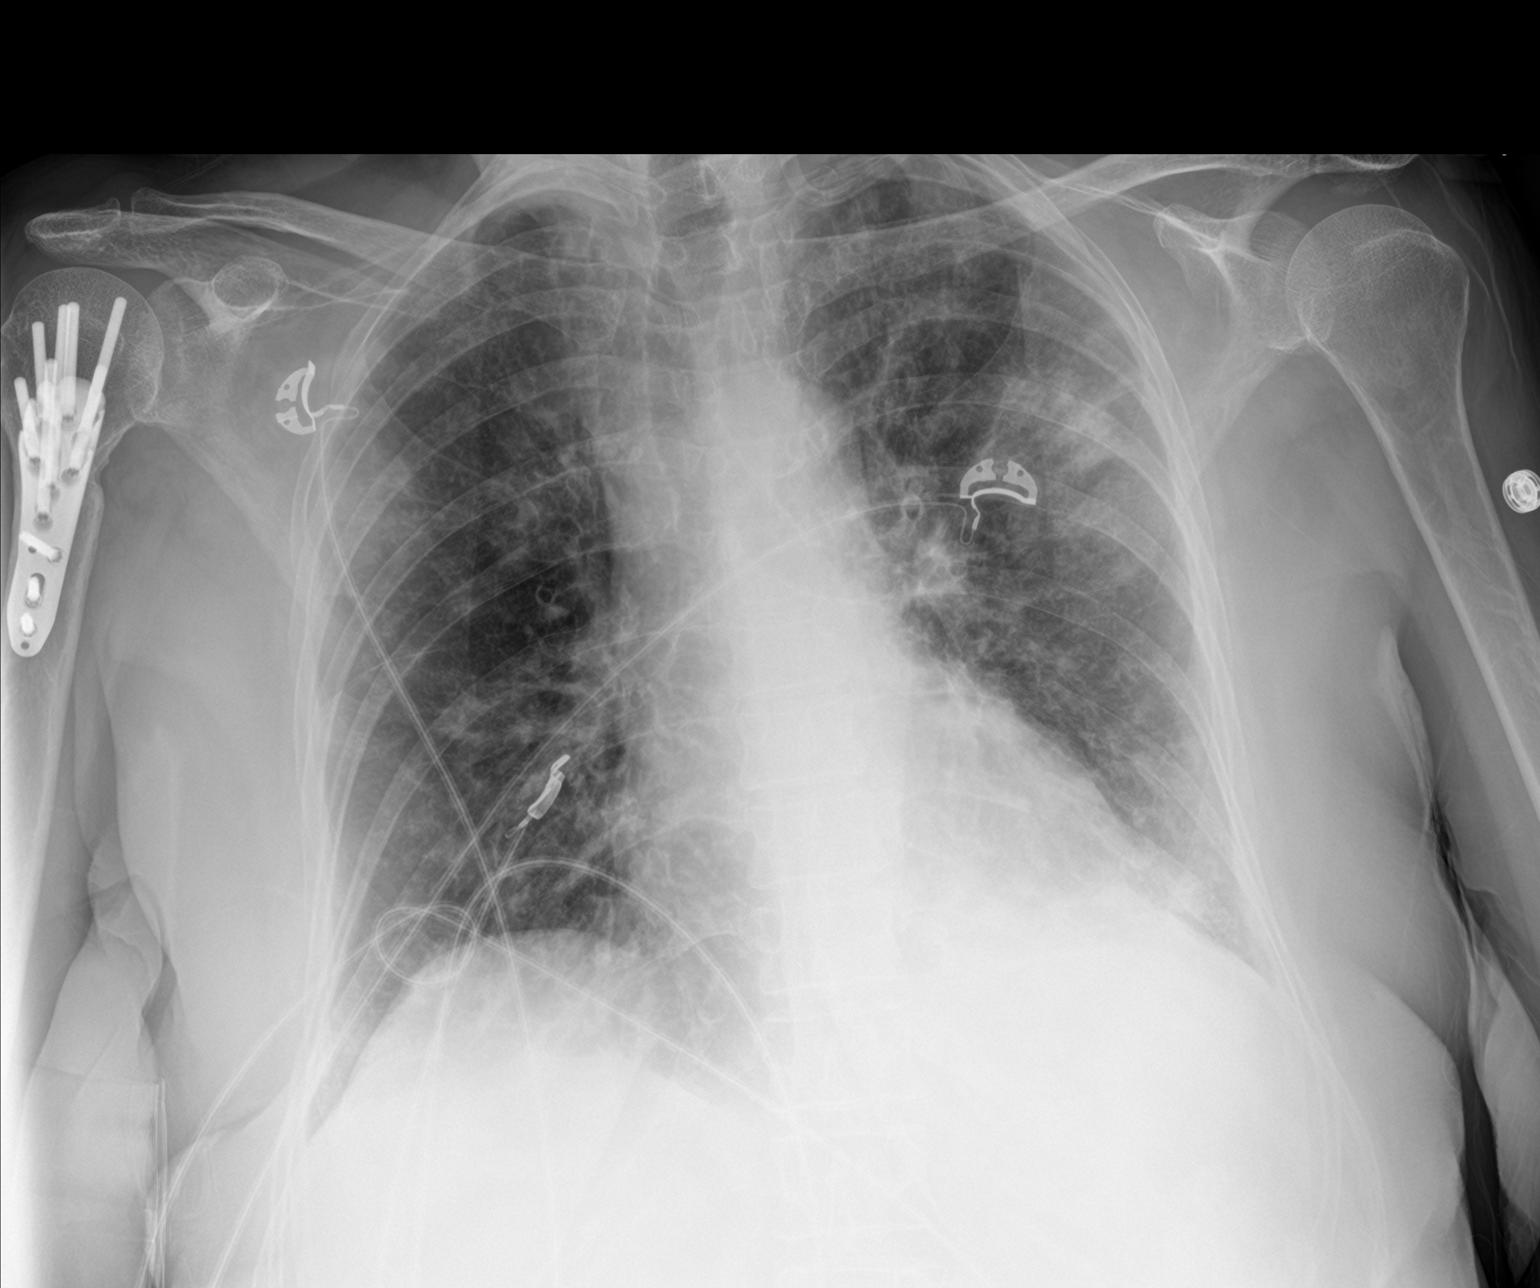

[2 of 2 positions shown; findings below may reference images not displayed]

FINDINGS: Heart size is stable and mildly enlarged.

Peripheral opacities in the chest have diminished since the previous
exam more so in the right chest than on the left. Left upper lobe
opacity persists with improved aeration at the left lung base since
prior study.

Background interstitial prominence is also improved. Graded opacity
at the lung bases less than on the previous study, perhaps still
with small left-sided pleural effusion.

Postoperative changes in the right proximal humerus. Findings are
chronic and no signs of acute bone process.
IMPRESSION: Improving appearance of presumed multifocal pneumonia and effusions.

## 2020-04-17 ENCOUNTER — Other Ambulatory Visit: Payer: Self-pay | Admitting: Adult Health

## 2020-04-18 ENCOUNTER — Ambulatory Visit (INDEPENDENT_AMBULATORY_CARE_PROVIDER_SITE_OTHER): Payer: Medicare Other | Admitting: Pulmonary Disease

## 2020-04-18 ENCOUNTER — Encounter: Payer: Self-pay | Admitting: Pulmonary Disease

## 2020-04-18 ENCOUNTER — Other Ambulatory Visit: Payer: Self-pay

## 2020-04-18 ENCOUNTER — Ambulatory Visit: Payer: BLUE CROSS/BLUE SHIELD | Admitting: Endocrinology

## 2020-04-18 VITALS — BP 116/68 | HR 99 | Temp 98.4°F | Ht 64.5 in | Wt 148.4 lb

## 2020-04-18 DIAGNOSIS — J479 Bronchiectasis, uncomplicated: Secondary | ICD-10-CM

## 2020-04-18 DIAGNOSIS — J449 Chronic obstructive pulmonary disease, unspecified: Secondary | ICD-10-CM | POA: Diagnosis not present

## 2020-04-18 MED ORDER — STIOLTO RESPIMAT 2.5-2.5 MCG/ACT IN AERS
2.0000 | INHALATION_SPRAY | Freq: Every day | RESPIRATORY_TRACT | 0 refills | Status: DC
Start: 2020-04-18 — End: 2020-05-29

## 2020-04-18 NOTE — Assessment & Plan Note (Signed)
Plan: Resume flutter valve use Emphasized importance of utilizing flutter valve Notify our office if you have worsened respiratory symptoms or increased sputum production with sputum color changes

## 2020-04-18 NOTE — Patient Instructions (Addendum)
You were seen today by Lauraine Rinne, NP  for:  1. Bronchiectasis without complication (HCC)  Bronchiectasis: This is the medical term which indicates that you have damage, dilated airways making you more susceptible to respiratory infection. Use a flutter valve 10 breaths twice a day or 4 to 5 breaths 4-5 times a day to help clear mucus out Let us know if you have cough with change in mucus color or fevers or chills.  At that point you would need an antibiotic. Maintain a healthy nutritious diet, eating whole foods Take your medications as prescribed   2. Chronic obstructive pulmonary disease, unspecified COPD type ([place])  Continue performist nebs every 12 hours  Continue Yupelri nebulized meds daily  When you are feeling ill and not feeling up to utilizing her nebulizers then okay to utilize:  Stiolto Respimat inhaler >>>2 puffs daily >>>Take this no matter what >>>This is not a rescue inhaler  Remember: Stiolto Respimat has both of the medications and it that you are receiving 3 your nebulizer you use 1 or the other not both.  Follow Up:    Return in about 2 months (around 06/19/2020), or if symptoms worsen or fail to improve, for Follow up with Dr. Loanne Drilling.   Please do your part to reduce the spread of COVID-19:      Reduce your risk of any infection  and COVID19 by using the similar precautions used for avoiding the common cold or flu:   Wash your hands often with soap and warm water for at least 20 seconds.  If soap and water are not readily available, use an alcohol-based hand sanitizer with at least 60% alcohol.   If coughing or sneezing, cover your mouth and nose by coughing or sneezing into the elbow areas of your shirt or coat, into a tissue or into your sleeve (not your hands).  WEAR A MASK when in public   Avoid shaking hands with others and consider head nods or verbal greetings only.  Avoid touching your eyes, nose, or mouth with unwashed hands.   Avoid  close contact with people who are sick.  Avoid places or events with large numbers of people in one location, like concerts or sporting events.  If you have some symptoms but not all symptoms, continue to monitor at home and seek medical attention if your symptoms worsen.  If you are having a medical emergency, call 911.   [place] / e-Visit: [email or web address]         [facility]: [facility]: [phone]                   [facility]: [phone]     It is flu season:   >>> Best ways to protect herself from the flu: Receive the yearly flu vaccine, practice good hand hygiene washing with soap and also using hand sanitizer when available, eat a nutritious meals, get adequate rest, hydrate appropriately   Please contact the office if your symptoms worsen or you have concerns that you are not improving.   Thank you for choosing  [facility] for your healthcare, and for allowing [place] to partner with you on your healthcare journey. I am thankful to be able to provide care to you today.   Wyn Quaker FNP-C
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
## 2020-04-18 NOTE — Assessment & Plan Note (Signed)
Discussed with patient as well as patient's family at length today.  Unfortunately patient will just need to be adherent to her medications as previously instructed.  Plan: Continue Yupelri nebulized meds Continue Perforomist nebulized meds Okay with providing samples of Stiolto Respimat for acute issues when patient is not feeling well and does not able to take her nebulized meds.  I emphasized to the patient as well as patient's family that these should be very limited occurrences.

## 2020-04-20 ENCOUNTER — Other Ambulatory Visit: Payer: Self-pay | Admitting: Gastroenterology

## 2020-04-20 DIAGNOSIS — R198 Other specified symptoms and signs involving the digestive system and abdomen: Secondary | ICD-10-CM

## 2020-04-20 DIAGNOSIS — R112 Nausea with vomiting, unspecified: Secondary | ICD-10-CM

## 2020-04-24 ENCOUNTER — Encounter: Payer: Self-pay | Admitting: Family Medicine

## 2020-04-24 MED ORDER — BENZONATATE 200 MG PO CAPS: 200.0000 mg | ORAL_CAPSULE | Freq: Three times a day (TID) | ORAL | 2 refills | Status: DC | PRN

## 2020-04-25 ENCOUNTER — Other Ambulatory Visit: Payer: Self-pay | Admitting: Neurology

## 2020-05-02 ENCOUNTER — Ambulatory Visit
Admission: RE | Admit: 2020-05-02 | Discharge: 2020-05-02 | Disposition: A | Discharge status: Home / Self Care | Payer: Medicare Other | Source: Ambulatory Visit | Attending: Gastroenterology | Admitting: Gastroenterology

## 2020-05-02 ENCOUNTER — Other Ambulatory Visit: Payer: Self-pay | Admitting: Gastroenterology

## 2020-05-02 DIAGNOSIS — K449 Diaphragmatic hernia without obstruction or gangrene: Secondary | ICD-10-CM | POA: Diagnosis not present

## 2020-05-02 DIAGNOSIS — I700 Atherosclerosis of aorta: Secondary | ICD-10-CM | POA: Diagnosis not present

## 2020-05-02 DIAGNOSIS — R112 Nausea with vomiting, unspecified: Secondary | ICD-10-CM

## 2020-05-02 DIAGNOSIS — R198 Other specified symptoms and signs involving the digestive system and abdomen: Secondary | ICD-10-CM

## 2020-05-02 DIAGNOSIS — M5136 Other intervertebral disc degeneration, lumbar region: Secondary | ICD-10-CM | POA: Diagnosis not present

## 2020-05-02 DIAGNOSIS — K5730 Diverticulosis of large intestine without perforation or abscess without bleeding: Secondary | ICD-10-CM | POA: Diagnosis not present

## 2020-05-08 DIAGNOSIS — H353134 Nonexudative age-related macular degeneration, bilateral, advanced atrophic with subfoveal involvement: Secondary | ICD-10-CM | POA: Diagnosis not present

## 2020-05-08 DIAGNOSIS — H43813 Vitreous degeneration, bilateral: Secondary | ICD-10-CM | POA: Diagnosis not present

## 2020-05-28 NOTE — Patient Instructions (Addendum)
It was great to see you again today, I will be in touch with your labs soon as possible  Please purchase an OTC yeast infection treatment such as monistat for vulvar irritation  We will have you start back on lyrica at bedtime- start with 50 mg, may increase to 100 mg after 2 weeks if needed  Please see me in about 6 months
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
## 2020-05-29 ENCOUNTER — Encounter: Payer: Self-pay | Admitting: Family Medicine

## 2020-05-29 ENCOUNTER — Ambulatory Visit (INDEPENDENT_AMBULATORY_CARE_PROVIDER_SITE_OTHER): Payer: Medicare Other | Admitting: Family Medicine

## 2020-05-29 ENCOUNTER — Other Ambulatory Visit: Payer: Self-pay

## 2020-05-29 VITALS — BP 118/72 | HR 84 | Resp 16 | Ht 64.5 in | Wt 153.0 lb

## 2020-05-29 DIAGNOSIS — E039 Hypothyroidism, unspecified: Secondary | ICD-10-CM | POA: Diagnosis not present

## 2020-05-29 DIAGNOSIS — G629 Polyneuropathy, unspecified: Secondary | ICD-10-CM | POA: Diagnosis not present

## 2020-05-29 DIAGNOSIS — I480 Paroxysmal atrial fibrillation: Secondary | ICD-10-CM

## 2020-05-29 DIAGNOSIS — E1122 Type 2 diabetes mellitus with diabetic chronic kidney disease: Secondary | ICD-10-CM | POA: Diagnosis not present

## 2020-05-29 DIAGNOSIS — N183 Chronic kidney disease, stage 3 unspecified: Secondary | ICD-10-CM

## 2020-05-29 DIAGNOSIS — I10 Essential (primary) hypertension: Secondary | ICD-10-CM | POA: Diagnosis not present

## 2020-05-29 DIAGNOSIS — E876 Hypokalemia: Secondary | ICD-10-CM | POA: Diagnosis not present

## 2020-05-29 DIAGNOSIS — Z794 Long term (current) use of insulin: Secondary | ICD-10-CM | POA: Diagnosis not present

## 2020-05-29 MED ORDER — PREGABALIN 50 MG PO CAPS: 50.0000 mg | ORAL_CAPSULE | Freq: Every day | ORAL | 1 refills | Status: DC

## 2020-05-30 LAB — COMPREHENSIVE METABOLIC PANEL
ALT: 10 U/L (ref 0–35)
AST: 13 U/L (ref 0–37)
Albumin: 4.1 g/dL (ref 3.5–5.2)
Alkaline Phosphatase: 53 U/L (ref 39–117)
BUN: 20 mg/dL (ref 6–23)
CO2: 28 mEq/L (ref 19–32)
Calcium: 9.1 mg/dL (ref 8.4–10.5)
Chloride: 102 mEq/L (ref 96–112)
Creatinine, Ser: 1.21 mg/dL — ABNORMAL HIGH (ref 0.40–1.20)
GFR: 41.79 mL/min — ABNORMAL LOW (ref >60.00)
Glucose, Bld: 201 mg/dL — ABNORMAL HIGH (ref 70–99)
Potassium: 4.2 mEq/L (ref 3.5–5.1)
Sodium: 138 mEq/L (ref 135–145)
Total Bilirubin: 0.2 mg/dL (ref 0.2–1.2)
Total Protein: 6.6 g/dL (ref 6.0–8.3)

## 2020-05-30 LAB — TSH: TSH: 3.55 u[IU]/mL (ref 0.35–4.50)

## 2020-05-30 LAB — CBC
HCT: 37.5 % (ref 36.0–46.0)
Hemoglobin: 12.6 g/dL (ref 12.0–15.0)
MCHC: 33.7 g/dL (ref 30.0–36.0)
MCV: 86 fl (ref 78.0–100.0)
Platelets: 220 10*3/uL (ref 150.0–400.0)
RBC: 4.36 Mil/uL (ref 3.87–5.11)
RDW: 13.7 % (ref 11.5–15.5)
WBC: 7.6 10*3/uL (ref 4.0–10.5)

## 2020-05-30 LAB — HEMOGLOBIN A1C: Hgb A1c MFr Bld: 7.3 % — ABNORMAL HIGH (ref 4.6–6.5)

## 2020-06-01 ENCOUNTER — Telehealth: Payer: Self-pay | Admitting: Family Medicine
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
## 2020-06-16 ENCOUNTER — Other Ambulatory Visit: Payer: Self-pay | Admitting: Neurology

## 2020-06-16 NOTE — Telephone Encounter (Signed)
Called and spoke to pt's granddaughter, [name], and told her I emailed charge correction to have the N/S fee removed.

## 2020-06-23 ENCOUNTER — Ambulatory Visit: Payer: BLUE CROSS/BLUE SHIELD | Admitting: Pulmonary Disease

## 2020-06-29 ENCOUNTER — Ambulatory Visit: Payer: Medicare Other | Admitting: Neurology

## 2020-06-30 ENCOUNTER — Ambulatory Visit: Payer: BLUE CROSS/BLUE SHIELD | Admitting: Pulmonary Disease

## 2020-07-04 ENCOUNTER — Other Ambulatory Visit: Payer: Self-pay | Admitting: Adult Health

## 2020-07-04 MED ORDER — POTASSIUM CHLORIDE CRYS ER 20 MEQ PO TBCR: 20.0000 meq | EXTENDED_RELEASE_TABLET | Freq: Every day | ORAL | 5 refills | Status: DC

## 2020-07-05 DIAGNOSIS — R112 Nausea with vomiting, unspecified: Secondary | ICD-10-CM | POA: Diagnosis not present

## 2020-07-06 ENCOUNTER — Encounter: Payer: Self-pay | Admitting: Pulmonary Disease

## 2020-07-06 ENCOUNTER — Other Ambulatory Visit: Payer: Self-pay

## 2020-07-06 ENCOUNTER — Ambulatory Visit (INDEPENDENT_AMBULATORY_CARE_PROVIDER_SITE_OTHER): Payer: Medicare Other | Admitting: Pulmonary Disease

## 2020-07-06 VITALS — BP 118/74 | HR 92 | Temp 98.3°F | Ht 65.0 in | Wt 154.6 lb

## 2020-07-06 DIAGNOSIS — Z23 Encounter for immunization: Secondary | ICD-10-CM | POA: Diagnosis not present

## 2020-07-06 DIAGNOSIS — Z0000 Encounter for general adult medical examination without abnormal findings: Secondary | ICD-10-CM

## 2020-07-06 DIAGNOSIS — J449 Chronic obstructive pulmonary disease, unspecified: Secondary | ICD-10-CM | POA: Diagnosis not present

## 2020-07-06 DIAGNOSIS — J479 Bronchiectasis, uncomplicated: Secondary | ICD-10-CM | POA: Diagnosis not present

## 2020-07-06 NOTE — Assessment & Plan Note (Signed)
Flu vaccine today 

## 2020-07-06 NOTE — Assessment & Plan Note (Signed)
Discussed with patient as well as patient's family at length today.  Unfortunately patient will just need to be adherent to her medications as previously instructed.  Plan: Continue Yupelri nebulized meds Continue Perforomist nebulized meds Okay with providing samples of Stiolto Respimat for acute issues when patient is not feeling well and does not able to take her nebulized meds.  I emphasized to the patient as well as patient's family that these should be very limited occurrences.

## 2020-07-06 NOTE — Assessment & Plan Note (Signed)
Plan: Continue flutter valve use Emphasized importance of utilizing flutter valve Notify our office if you have worsened respiratory symptoms or increased sputum production with sputum color changes
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
## 2020-07-06 NOTE — Patient Instructions (Addendum)
You were seen today by Lauraine Rinne, NP  for:   1. Bronchiectasis without complication (HCC)  Bronchiectasis: This is the medical term which indicates that you have damage, dilated airways making you more susceptible to respiratory infection. Use a flutter valve 10 breaths twice a day or 4 to 5 breaths 4-5 times a day to help clear mucus out Let us know if you have cough with change in mucus color or fevers or chills.  At that point you would need an antibiotic. Maintain a healthy nutritious diet, eating whole foods Take your medications as prescribed   2. Chronic obstructive pulmonary disease, unspecified COPD type ([place])  Continue performist nebs every 12 hours  Continue Yupelri nebulized meds daily  When you are feeling ill and not feeling up to utilizing her nebulizers then okay to utilize:  Stiolto Respimat inhaler >>>2 puffs daily >>>Take this no matter what >>>This is not a rescue inhaler  Remember: Stiolto Respimat has both of the medications and it that you are receiving 3 your nebulizer you use 1 or the other not both.  Follow Up:    Return in about 6 months (around 01/03/2021), or if symptoms worsen or fail to improve, for Follow up with Dr. Loanne Drilling.   Please do your part to reduce the spread of COVID-19:      Reduce your risk of any infection  and COVID19 by using the similar precautions used for avoiding the common cold or flu:  Marland Kitchen Wash your hands often with soap and warm water for at least 20 seconds.  If soap and water are not readily available, use an alcohol-based hand sanitizer with at least 60% alcohol.  . If coughing or sneezing, cover your mouth and nose by coughing or sneezing into the elbow areas of your shirt or coat, into a tissue or into your sleeve (not your hands). Langley Gauss A MASK when in public  . Avoid shaking hands with others and consider head nods or verbal greetings only. . Avoid touching your eyes, nose, or mouth with unwashed hands.  . Avoid  close contact with people who are sick. . Avoid places or events with large numbers of people in one location, like concerts or sporting events. . If you have some symptoms but not all symptoms, continue to monitor at home and seek medical attention if your symptoms worsen. . If you are having a medical emergency, call 911.   [place] / e-Visit: [email or web address]         [facility]: [facility]: [phone]                   [facility]: [phone]     It is flu season:   >>> Best ways to protect herself from the flu: Receive the yearly flu vaccine, practice good hand hygiene washing with soap and also using hand sanitizer when available, eat a nutritious meals, get adequate rest, hydrate appropriately   Please contact the office if your symptoms worsen or you have concerns that you are not improving.   Thank you for choosing [facility] for your healthcare, and for allowing [place] to partner with you on your healthcare journey. I am thankful to be able to provide care to you today.   Wyn Quaker FNP-C

## 2020-07-12 ENCOUNTER — Encounter: Payer: Self-pay | Admitting: Family Medicine

## 2020-07-15 ENCOUNTER — Encounter: Payer: Self-pay | Admitting: Family Medicine

## 2020-07-15 ENCOUNTER — Other Ambulatory Visit: Payer: Self-pay | Admitting: Family Medicine

## 2020-07-15 MED ORDER — BENZONATATE 200 MG PO CAPS: ORAL_CAPSULE | ORAL | 2 refills | Status: DC

## 2020-07-16 ENCOUNTER — Other Ambulatory Visit: Payer: Self-pay | Admitting: Adult Health

## 2020-07-20 ENCOUNTER — Encounter: Payer: Self-pay | Admitting: Family Medicine

## 2020-07-20 MED ORDER — INTEGRA 62.5-62.5-40-3 MG PO CAPS
1.0000 | ORAL_CAPSULE | Freq: Every day | ORAL | 3 refills | Status: DC
Start: 2020-07-20 — End: 2021-07-16

## 2020-07-20 MED ORDER — FUROSEMIDE 40 MG PO TABS: 40.0000 mg | ORAL_TABLET | Freq: Every day | ORAL | 1 refills | Status: DC

## 2020-07-20 MED ORDER — TRAZODONE HCL 100 MG PO TABS: 100.0000 mg | ORAL_TABLET | Freq: Every day | ORAL | 1 refills | Status: DC

## 2020-07-20 MED ORDER — ATORVASTATIN CALCIUM 40 MG PO TABS
40.0000 mg | ORAL_TABLET | Freq: Every day | ORAL | 1 refills | Status: DC
Start: 2020-07-20 — End: 2021-01-11

## 2020-07-20 MED ORDER — LEVOTHYROXINE SODIUM 50 MCG PO TABS: 50.0000 ug | ORAL_TABLET | Freq: Every day | ORAL | 1 refills | Status: DC
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
## 2020-08-17 ENCOUNTER — Ambulatory Visit (HOSPITAL_BASED_OUTPATIENT_CLINIC_OR_DEPARTMENT_OTHER)
Admission: RE | Admit: 2020-08-17 | Discharge: 2020-08-17 | Disposition: A | Discharge status: Home / Self Care | Payer: Medicare Other | Source: Ambulatory Visit | Attending: Family Medicine | Admitting: Family Medicine

## 2020-08-17 ENCOUNTER — Other Ambulatory Visit: Payer: Self-pay

## 2020-08-17 ENCOUNTER — Ambulatory Visit (INDEPENDENT_AMBULATORY_CARE_PROVIDER_SITE_OTHER): Payer: Medicare Other | Admitting: Family Medicine

## 2020-08-17 ENCOUNTER — Encounter: Payer: Self-pay | Admitting: Family Medicine

## 2020-08-17 VITALS — BP 128/78 | HR 74 | Resp 14 | Ht 65.0 in | Wt 161.0 lb

## 2020-08-17 DIAGNOSIS — E039 Hypothyroidism, unspecified: Secondary | ICD-10-CM

## 2020-08-17 DIAGNOSIS — N183 Chronic kidney disease, stage 3 unspecified: Secondary | ICD-10-CM

## 2020-08-17 DIAGNOSIS — N8110 Cystocele, unspecified: Secondary | ICD-10-CM | POA: Diagnosis not present

## 2020-08-17 DIAGNOSIS — R0601 Orthopnea: Secondary | ICD-10-CM

## 2020-08-17 DIAGNOSIS — R600 Localized edema: Secondary | ICD-10-CM

## 2020-08-17 DIAGNOSIS — E1122 Type 2 diabetes mellitus with diabetic chronic kidney disease: Secondary | ICD-10-CM

## 2020-08-17 DIAGNOSIS — R053 Chronic cough: Secondary | ICD-10-CM | POA: Diagnosis not present

## 2020-08-17 DIAGNOSIS — I509 Heart failure, unspecified: Secondary | ICD-10-CM | POA: Diagnosis not present

## 2020-08-17 DIAGNOSIS — B372 Candidiasis of skin and nail: Secondary | ICD-10-CM | POA: Diagnosis not present

## 2020-08-17 DIAGNOSIS — I10 Essential (primary) hypertension: Secondary | ICD-10-CM | POA: Diagnosis not present

## 2020-08-17 DIAGNOSIS — J811 Chronic pulmonary edema: Secondary | ICD-10-CM | POA: Diagnosis not present

## 2020-08-17 DIAGNOSIS — M4312 Spondylolisthesis, cervical region: Secondary | ICD-10-CM | POA: Diagnosis not present

## 2020-08-17 DIAGNOSIS — M7989 Other specified soft tissue disorders: Secondary | ICD-10-CM | POA: Diagnosis not present

## 2020-08-17 DIAGNOSIS — M47812 Spondylosis without myelopathy or radiculopathy, cervical region: Secondary | ICD-10-CM | POA: Insufficient documentation

## 2020-08-17 DIAGNOSIS — I83893 Varicose veins of bilateral lower extremities with other complications: Secondary | ICD-10-CM | POA: Diagnosis not present

## 2020-08-17 DIAGNOSIS — Z794 Long term (current) use of insulin: Secondary | ICD-10-CM

## 2020-08-17 DIAGNOSIS — I480 Paroxysmal atrial fibrillation: Secondary | ICD-10-CM | POA: Diagnosis not present

## 2020-08-17 DIAGNOSIS — E2839 Other primary ovarian failure: Secondary | ICD-10-CM | POA: Diagnosis not present

## 2020-08-17 DIAGNOSIS — J449 Chronic obstructive pulmonary disease, unspecified: Secondary | ICD-10-CM | POA: Diagnosis not present

## 2020-08-17 DIAGNOSIS — M2578 Osteophyte, vertebrae: Secondary | ICD-10-CM | POA: Diagnosis not present

## 2020-08-17 MED ORDER — NYSTATIN 100000 UNIT/GM EX POWD: 1.0000 "application " | Freq: Three times a day (TID) | CUTANEOUS | 1 refills | Status: DC

## 2020-08-17 MED ORDER — BENZONATATE 200 MG PO CAPS: ORAL_CAPSULE | ORAL | 2 refills | Status: DC

## 2020-08-17 NOTE — Patient Instructions (Addendum)
It was good to see you today- I suspect you have an exacerbation of your heart failure causing your feet to swell Ok to take 2 furosemide daily for the next few days if you would like  Please go to lab and then to the ground floor imaging for an chest film and neck x-rays   Please schedule an eye exam at your convenience

## 2020-08-18 ENCOUNTER — Encounter: Payer: Self-pay | Admitting: Family Medicine

## 2020-08-18 LAB — BASIC METABOLIC PANEL
BUN/Creatinine Ratio: 21 (calc) (ref 6–22)
BUN: 28 mg/dL — ABNORMAL HIGH (ref 7–25)
CO2: 26 mmol/L (ref 20–32)
Calcium: 9 mg/dL (ref 8.6–10.4)
Chloride: 102 mmol/L (ref 98–110)
Creat: 1.33 mg/dL — ABNORMAL HIGH (ref 0.60–0.88)
Glucose, Bld: 161 mg/dL — ABNORMAL HIGH (ref 65–99)
Potassium: 4.6 mmol/L (ref 3.5–5.3)
Sodium: 136 mmol/L (ref 135–146)

## 2020-08-18 LAB — HEMOGLOBIN A1C
Hgb A1c MFr Bld: 7.6 % of total Hgb — ABNORMAL HIGH (ref <5.7)
Mean Plasma Glucose: 171 (calc)
eAG (mmol/L): 9.5 (calc)

## 2020-08-18 LAB — BRAIN NATRIURETIC PEPTIDE: Brain Natriuretic Peptide: 95 pg/mL (ref <100)

## 2020-08-29 ENCOUNTER — Ambulatory Visit (HOSPITAL_BASED_OUTPATIENT_CLINIC_OR_DEPARTMENT_OTHER)
Admission: RE | Admit: 2020-08-29 | Discharge: 2020-08-29 | Disposition: A | Discharge status: Home / Self Care | Payer: Medicare Other | Source: Ambulatory Visit | Attending: Family Medicine | Admitting: Family Medicine

## 2020-08-29 ENCOUNTER — Other Ambulatory Visit: Payer: Self-pay

## 2020-08-29 ENCOUNTER — Encounter: Payer: Self-pay | Admitting: Family Medicine

## 2020-08-29 DIAGNOSIS — M85851 Other specified disorders of bone density and structure, right thigh: Secondary | ICD-10-CM | POA: Diagnosis not present

## 2020-08-29 DIAGNOSIS — M8580 Other specified disorders of bone density and structure, unspecified site: Secondary | ICD-10-CM | POA: Insufficient documentation

## 2020-08-29 DIAGNOSIS — E039 Hypothyroidism, unspecified: Secondary | ICD-10-CM | POA: Diagnosis not present

## 2020-08-29 DIAGNOSIS — E2839 Other primary ovarian failure: Secondary | ICD-10-CM | POA: Diagnosis not present

## 2020-08-29 DIAGNOSIS — R29890 Loss of height: Secondary | ICD-10-CM | POA: Diagnosis not present

## 2020-08-31 ENCOUNTER — Other Ambulatory Visit: Payer: Self-pay

## 2020-08-31 ENCOUNTER — Encounter: Payer: Self-pay | Admitting: Cardiology

## 2020-08-31 ENCOUNTER — Ambulatory Visit (INDEPENDENT_AMBULATORY_CARE_PROVIDER_SITE_OTHER): Payer: Medicare Other | Admitting: Cardiology

## 2020-08-31 VITALS — BP 110/60 | HR 92 | Ht 65.0 in | Wt 157.0 lb

## 2020-08-31 DIAGNOSIS — I5022 Chronic systolic (congestive) heart failure: Secondary | ICD-10-CM

## 2020-08-31 DIAGNOSIS — I480 Paroxysmal atrial fibrillation: Secondary | ICD-10-CM

## 2020-08-31 DIAGNOSIS — I428 Other cardiomyopathies: Secondary | ICD-10-CM

## 2020-08-31 NOTE — Patient Instructions (Signed)
Medication Instructions:  The current medical regimen is effective;  continue present plan and medications.  You may take an extra Furosemide as needed for swelling/wt gain.  *If you need a refill on your cardiac medications before your next appointment, please call your pharmacy*  Follow-Up: At [facility], you and your health needs are our priority.  As part of our continuing mission to provide you with exceptional heart care, we have created designated Provider Care Teams.  These Care Teams include your primary Cardiologist (physician) and Advanced Practice Providers (APPs -  Physician Assistants and Nurse Practitioners) who all work together to provide you with the care you need, when you need it.  We recommend signing up for the patient portal called "MyChart".  Sign up information is provided on this After Visit Summary.  MyChart is used to connect with patients for Virtual Visits (Telemedicine).  Patients are able to view lab/test results, encounter notes, upcoming appointments, etc.  Non-urgent messages can be sent to your provider as well.   To learn more about what you can do with MyChart, go to [email or web address].    Your next appointment:   6 month(s)  The format for your next appointment:   In Person  Provider:   [name], MD   Thank you for choosing [facility]!!
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
## 2020-09-06 ENCOUNTER — Ambulatory Visit: Payer: Medicare Other | Admitting: Neurology

## 2020-09-13 ENCOUNTER — Emergency Department (HOSPITAL_BASED_OUTPATIENT_CLINIC_OR_DEPARTMENT_OTHER)
Admission: EM | Admit: 2020-09-13 | Discharge: 2020-09-13 | Disposition: A | Discharge status: Home / Self Care | Payer: Medicare Other | Attending: Emergency Medicine | Admitting: Emergency Medicine

## 2020-09-13 ENCOUNTER — Encounter (HOSPITAL_BASED_OUTPATIENT_CLINIC_OR_DEPARTMENT_OTHER): Payer: Self-pay

## 2020-09-13 ENCOUNTER — Emergency Department (HOSPITAL_BASED_OUTPATIENT_CLINIC_OR_DEPARTMENT_OTHER): Payer: Medicare Other

## 2020-09-13 ENCOUNTER — Other Ambulatory Visit: Payer: Self-pay

## 2020-09-13 DIAGNOSIS — Z7951 Long term (current) use of inhaled steroids: Secondary | ICD-10-CM | POA: Insufficient documentation

## 2020-09-13 DIAGNOSIS — I5022 Chronic systolic (congestive) heart failure: Secondary | ICD-10-CM | POA: Diagnosis not present

## 2020-09-13 DIAGNOSIS — E039 Hypothyroidism, unspecified: Secondary | ICD-10-CM | POA: Insufficient documentation

## 2020-09-13 DIAGNOSIS — R1110 Vomiting, unspecified: Secondary | ICD-10-CM

## 2020-09-13 DIAGNOSIS — I4891 Unspecified atrial fibrillation: Secondary | ICD-10-CM | POA: Insufficient documentation

## 2020-09-13 DIAGNOSIS — R0789 Other chest pain: Secondary | ICD-10-CM

## 2020-09-13 DIAGNOSIS — E1140 Type 2 diabetes mellitus with diabetic neuropathy, unspecified: Secondary | ICD-10-CM | POA: Diagnosis not present

## 2020-09-13 DIAGNOSIS — R21 Rash and other nonspecific skin eruption: Secondary | ICD-10-CM | POA: Diagnosis not present

## 2020-09-13 DIAGNOSIS — I130 Hypertensive heart and chronic kidney disease with heart failure and stage 1 through stage 4 chronic kidney disease, or unspecified chronic kidney disease: Secondary | ICD-10-CM | POA: Insufficient documentation

## 2020-09-13 DIAGNOSIS — Z79899 Other long term (current) drug therapy: Secondary | ICD-10-CM | POA: Diagnosis not present

## 2020-09-13 DIAGNOSIS — R079 Chest pain, unspecified: Secondary | ICD-10-CM | POA: Insufficient documentation

## 2020-09-13 DIAGNOSIS — N183 Chronic kidney disease, stage 3 unspecified: Secondary | ICD-10-CM | POA: Insufficient documentation

## 2020-09-13 DIAGNOSIS — Z87891 Personal history of nicotine dependence: Secondary | ICD-10-CM | POA: Diagnosis not present

## 2020-09-13 DIAGNOSIS — R0602 Shortness of breath: Secondary | ICD-10-CM | POA: Diagnosis not present

## 2020-09-13 DIAGNOSIS — Z7901 Long term (current) use of anticoagulants: Secondary | ICD-10-CM | POA: Insufficient documentation

## 2020-09-13 DIAGNOSIS — R197 Diarrhea, unspecified: Secondary | ICD-10-CM | POA: Insufficient documentation

## 2020-09-13 DIAGNOSIS — Z794 Long term (current) use of insulin: Secondary | ICD-10-CM | POA: Insufficient documentation

## 2020-09-13 LAB — COMPREHENSIVE METABOLIC PANEL
ALT: 14 U/L (ref 0–44)
AST: 20 U/L (ref 15–41)
Albumin: 4 g/dL (ref 3.5–5.0)
Alkaline Phosphatase: 60 U/L (ref 38–126)
Anion gap: 9 (ref 5–15)
BUN: 26 mg/dL — ABNORMAL HIGH (ref 8–23)
CO2: 28 mmol/L (ref 22–32)
Calcium: 8.9 mg/dL (ref 8.9–10.3)
Chloride: 97 mmol/L — ABNORMAL LOW (ref 98–111)
Creatinine, Ser: 1.51 mg/dL — ABNORMAL HIGH (ref 0.44–1.00)
GFR, Estimated: 33 mL/min — ABNORMAL LOW (ref >60)
Glucose, Bld: 132 mg/dL — ABNORMAL HIGH (ref 70–99)
Potassium: 4.2 mmol/L (ref 3.5–5.1)
Sodium: 134 mmol/L — ABNORMAL LOW (ref 135–145)
Total Bilirubin: 0.4 mg/dL (ref 0.3–1.2)
Total Protein: 7.3 g/dL (ref 6.5–8.1)

## 2020-09-13 LAB — CBC WITH DIFFERENTIAL/PLATELET
Abs Immature Granulocytes: 0.02 10*3/uL (ref 0.00–0.07)
Basophils Absolute: 0 10*3/uL (ref 0.0–0.1)
Basophils Relative: 0 %
Eosinophils Absolute: 0.2 10*3/uL (ref 0.0–0.5)
Eosinophils Relative: 2 %
HCT: 40.6 % (ref 36.0–46.0)
Hemoglobin: 13.3 g/dL (ref 12.0–15.0)
Immature Granulocytes: 0 %
Lymphocytes Relative: 31 %
Lymphs Abs: 2.3 10*3/uL (ref 0.7–4.0)
MCH: 28.5 pg (ref 26.0–34.0)
MCHC: 32.8 g/dL (ref 30.0–36.0)
MCV: 87.1 fL (ref 80.0–100.0)
Monocytes Absolute: 0.7 10*3/uL (ref 0.1–1.0)
Monocytes Relative: 10 %
Neutro Abs: 4.2 10*3/uL (ref 1.7–7.7)
Neutrophils Relative %: 57 %
Platelets: 201 10*3/uL (ref 150–400)
RBC: 4.66 MIL/uL (ref 3.87–5.11)
RDW: 12.6 % (ref 11.5–15.5)
WBC: 7.5 10*3/uL (ref 4.0–10.5)
nRBC: 0 % (ref 0.0–0.2)

## 2020-09-13 LAB — BRAIN NATRIURETIC PEPTIDE: B Natriuretic Peptide: 227.1 pg/mL — ABNORMAL HIGH (ref 0.0–100.0)

## 2020-09-13 LAB — TROPONIN I (HIGH SENSITIVITY)
Troponin I (High Sensitivity): 8 ng/L (ref <18)
Troponin I (High Sensitivity): 8 ng/L (ref <18)

## 2020-09-13 LAB — LIPASE, BLOOD: Lipase: 45 U/L (ref 11–51)

## 2020-09-13 MED ORDER — ONDANSETRON 4 MG PO TBDP: ORAL_TABLET | ORAL | 0 refills | Status: DC

## 2020-09-13 MED ORDER — ONDANSETRON HCL 4 MG/2ML IJ SOLN
4.0000 mg | Freq: Once | INTRAMUSCULAR | Status: AC
  Administered 2020-09-13: 4 mg via INTRAVENOUS
  Filled 2020-09-13: qty 2

## 2020-09-13 MED ORDER — SODIUM CHLORIDE 0.9 % IV BOLUS
500.0000 mL | Freq: Once | INTRAVENOUS | Status: AC
  Administered 2020-09-13: 500 mL via INTRAVENOUS

## 2020-09-13 MED ORDER — FAMOTIDINE IN NACL 20-0.9 MG/50ML-% IV SOLN
20.0000 mg | Freq: Once | INTRAVENOUS | Status: AC
  Administered 2020-09-13: 20 mg via INTRAVENOUS
  Filled 2020-09-13: qty 50

## 2020-09-13 NOTE — Discharge Instructions (Signed)
Take zofran for nausea   Your heart function is normal right now. You are dehydrated from vomiting and diarrhea so please drink plenty of fluids   See your doctor   Return to ER if you have worse vomiting, chest pain, abdominal pain

## 2020-09-13 NOTE — ED Triage Notes (Addendum)
Pt c/o CP x 2 hours-to triage in w/c-NAD-also c/o n/v/d started last night and a chronic cough x 1 year-daughter states she has been seen by pulmonologist for cough c/o
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
## 2020-09-14 ENCOUNTER — Other Ambulatory Visit: Payer: Self-pay | Admitting: Neurology

## 2020-09-16 ENCOUNTER — Encounter: Payer: Self-pay | Admitting: Family Medicine

## 2020-09-16 DIAGNOSIS — R300 Dysuria: Secondary | ICD-10-CM

## 2020-09-17 MED ORDER — CEPHALEXIN 500 MG PO CAPS: 500.0000 mg | ORAL_CAPSULE | Freq: Two times a day (BID) | ORAL | 0 refills | Status: DC

## 2020-09-17 NOTE — Addendum Note (Signed)
Addended by: Lamar Blinks C on: 09/17/2020 07:10 AM   Modules accepted: Orders

## 2020-09-18 ENCOUNTER — Other Ambulatory Visit (INDEPENDENT_AMBULATORY_CARE_PROVIDER_SITE_OTHER): Payer: Medicare Other

## 2020-09-18 ENCOUNTER — Other Ambulatory Visit: Payer: Self-pay

## 2020-09-18 DIAGNOSIS — R300 Dysuria: Secondary | ICD-10-CM | POA: Diagnosis not present

## 2020-09-18 LAB — POC URINALSYSI DIPSTICK (AUTOMATED)
Bilirubin, UA: NEGATIVE
Glucose, UA: NEGATIVE
Ketones, UA: NEGATIVE
Nitrite, UA: POSITIVE
Protein, UA: POSITIVE — AB
Spec Grav, UA: 1.02 (ref 1.010–1.025)
Urobilinogen, UA: NEGATIVE E.U./dL — AB
pH, UA: 5 (ref 5.0–8.0)

## 2020-09-18 NOTE — Addendum Note (Signed)
Addended by: Kelle Darting A on: 09/18/2020 12:22 PM   Modules accepted: Orders

## 2020-09-19 LAB — URINE CULTURE
MICRO NUMBER:: 11280593
SPECIMEN QUALITY:: ADEQUATE

## 2020-09-20 ENCOUNTER — Other Ambulatory Visit: Payer: Self-pay | Admitting: Family Medicine

## 2020-09-20 ENCOUNTER — Encounter: Payer: Self-pay | Admitting: Family Medicine

## 2020-09-20 DIAGNOSIS — R300 Dysuria: Secondary | ICD-10-CM

## 2020-09-26 ENCOUNTER — Other Ambulatory Visit: Payer: Self-pay

## 2020-09-26 ENCOUNTER — Other Ambulatory Visit (INDEPENDENT_AMBULATORY_CARE_PROVIDER_SITE_OTHER): Payer: Medicare Other

## 2020-09-26 DIAGNOSIS — R300 Dysuria: Secondary | ICD-10-CM

## 2020-09-26 DIAGNOSIS — R8290 Unspecified abnormal findings in urine: Secondary | ICD-10-CM

## 2020-09-26 LAB — POCT URINALYSIS DIP (MANUAL ENTRY)
Bilirubin, UA: NEGATIVE
Blood, UA: NEGATIVE
Glucose, UA: NEGATIVE mg/dL
Ketones, POC UA: NEGATIVE mg/dL
Nitrite, UA: POSITIVE — AB
Spec Grav, UA: 1.025 (ref 1.010–1.025)
Urobilinogen, UA: 0.2 E.U./dL
pH, UA: 6 (ref 5.0–8.0)

## 2020-09-27 ENCOUNTER — Encounter: Payer: Self-pay | Admitting: Family Medicine

## 2020-09-27 DIAGNOSIS — R300 Dysuria: Secondary | ICD-10-CM | POA: Diagnosis not present

## 2020-09-27 DIAGNOSIS — R8290 Unspecified abnormal findings in urine: Secondary | ICD-10-CM | POA: Diagnosis not present

## 2020-09-27 LAB — URINALYSIS, MICROSCOPIC ONLY

## 2020-09-27 NOTE — Addendum Note (Signed)
Addended by: Kelle Darting A on: 09/27/2020 07:10 AM   Modules accepted: Orders

## 2020-09-27 NOTE — Progress Notes (Signed)
urin

## 2020-09-29 ENCOUNTER — Encounter: Payer: Self-pay | Admitting: Family Medicine

## 2020-09-29 ENCOUNTER — Other Ambulatory Visit: Payer: Self-pay | Admitting: Family Medicine

## 2020-09-30 ENCOUNTER — Encounter: Payer: Self-pay | Admitting: Family Medicine

## 2020-09-30 MED ORDER — FOSFOMYCIN TROMETHAMINE 3 G PO PACK: 3.0000 g | PACK | Freq: Once | ORAL | 0 refills | Status: AC

## 2020-09-30 NOTE — Progress Notes (Signed)
Called [facility] pharmacy to ask about treatment for this UTI- will use fosfomycin for her Called her pharmacy to confirm that they have Message to pt

## 2020-09-30 NOTE — Addendum Note (Signed)
Addended by: Lamar Blinks C on: 09/30/2020 04:33 PM   Modules accepted: Orders

## 2020-10-01 LAB — URINE CULTURE
MICRO NUMBER:: 11321088
SPECIMEN QUALITY:: ADEQUATE

## 2020-10-02 ENCOUNTER — Encounter: Payer: Self-pay | Admitting: Family Medicine

## 2020-10-02 MED ORDER — SULFAMETHOXAZOLE-TRIMETHOPRIM 400-80 MG PO TABS: 1.0000 | ORAL_TABLET | Freq: Two times a day (BID) | ORAL | 0 refills | Status: DC

## 2020-10-02 MED ORDER — ONDANSETRON 4 MG PO TBDP
ORAL_TABLET | ORAL | 1 refills | Status: DC
Start: 2020-10-02 — End: 2021-04-06

## 2020-10-04 ENCOUNTER — Encounter: Payer: Self-pay | Admitting: Family Medicine

## 2020-10-04 MED ORDER — FLUOXETINE HCL 40 MG PO CAPS
40.0000 mg | ORAL_CAPSULE | Freq: Every day | ORAL | 3 refills | Status: DC
Start: 2020-10-04 — End: 2021-09-12

## 2020-11-01 ENCOUNTER — Encounter: Payer: BLUE CROSS/BLUE SHIELD | Admitting: Obstetrics & Gynecology

## 2020-11-08 ENCOUNTER — Ambulatory Visit (INDEPENDENT_AMBULATORY_CARE_PROVIDER_SITE_OTHER): Payer: Medicare Other | Admitting: Neurology

## 2020-11-08 ENCOUNTER — Encounter: Payer: Self-pay | Admitting: Neurology

## 2020-11-08 VITALS — BP 126/73 | HR 84 | Ht 65.0 in | Wt 158.0 lb

## 2020-11-08 DIAGNOSIS — Z794 Long term (current) use of insulin: Secondary | ICD-10-CM | POA: Diagnosis not present

## 2020-11-08 DIAGNOSIS — E1140 Type 2 diabetes mellitus with diabetic neuropathy, unspecified: Secondary | ICD-10-CM

## 2020-11-08 DIAGNOSIS — G2581 Restless legs syndrome: Secondary | ICD-10-CM

## 2020-11-08 MED ORDER — LEVETIRACETAM 250 MG PO TABS: 250.0000 mg | ORAL_TABLET | Freq: Every day | ORAL | 1 refills | Status: DC

## 2020-11-08 MED ORDER — PRAMIPEXOLE DIHYDROCHLORIDE 0.25 MG PO TABS: ORAL_TABLET | ORAL | 5 refills | Status: DC
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
## 2020-11-08 NOTE — Progress Notes (Signed)
I have read the note, and I agree with the clinical assessment and plan.  Jaizon Deroos K Cherokee Clowers   

## 2020-11-08 NOTE — Patient Instructions (Signed)
Add in Mirapex 0.25 mg tablet at 5 pm  Continue the Lyrica, Keppra, Mirapex 0.75 mg at bedtime  Let me know in 1-2 weeks how things are going  We can further adjust if needed See you back in 6 months

## 2020-11-21 ENCOUNTER — Encounter: Payer: Self-pay | Admitting: Family Medicine

## 2020-11-22 ENCOUNTER — Encounter: Payer: Self-pay | Admitting: Obstetrics & Gynecology

## 2020-11-22 ENCOUNTER — Other Ambulatory Visit: Payer: Self-pay

## 2020-11-22 ENCOUNTER — Ambulatory Visit (INDEPENDENT_AMBULATORY_CARE_PROVIDER_SITE_OTHER): Payer: Medicare Other | Admitting: Obstetrics & Gynecology

## 2020-11-22 VITALS — BP 102/61 | HR 109 | Wt 157.0 lb

## 2020-11-22 DIAGNOSIS — N393 Stress incontinence (female) (male): Secondary | ICD-10-CM

## 2020-11-22 NOTE — Progress Notes (Signed)
History:  87 y.o. [id number] (one of her daughers was killed in an accident) here today for eval of 'bladder falling leading to leaking of urine'.  Pt is s/p uncomplicated SVD x 3. She is s/p hysterectomy with bladder sling  >20 years prev. She was seen prev and for SUI and told that if she had another procedure she would have to self cath. She cont to have leakage of urine. She reports that the 'bladder had dropped' but she reports that she was told that by someone. She has never felt the bladder outside of the introitus. She does void some but also leaks all day. She reports that if she's out all day she has to change the pad 4 times. She does also use the toilet through out the day to void. Pt reports that she drinks a cup of caffeine daily and drinks decaffeinated tea through the day.     The following portions of the patient's history were reviewed and updated as appropriate: allergies, current medications, past family history, past medical history, past social history, past surgical history and problem list.  Review of Systems:  Pertinent items are noted in HPI.    Objective:  Physical Exam Blood pressure 102/61, pulse (!) 109, weight 157 lb (71.2 kg).  CONSTITUTIONAL: Well-developed, well-nourished female in no acute distress.  HENT:  Normocephalic, atraumatic EYES: Conjunctivae and EOM are normal. No scleral icterus.  NECK: Normal range of motion SKIN: Skin is warm and dry. No rash noted. Not diaphoretic.No pallor. [place]: Alert and oriented to person, place, and time. Normal coordination.  Abd: Soft, nontender and nondistended Pelvic: Atrophic appearing external genitalia; atrophic vaginal mucosa. No prolapse noted. Leakage of urine with mild valsalva. No palpable masses or adnexal tenderness  Assessment & Plan:  Female incontinence. I suspect mixed. There is definitely a stress components.    Referral to UroGYN  Reviewed caffeine use. Rec cessation   Total face-to-face time with  patient was 30 min.  Greater than 50% was spent in counseling and coordination of care with the patient.   Walaa Carel L. Harraway-Smith, M.D., [place]lynn June

## 2020-11-30 ENCOUNTER — Ambulatory Visit: Payer: BLUE CROSS/BLUE SHIELD | Admitting: Family Medicine

## 2020-12-07 ENCOUNTER — Other Ambulatory Visit: Payer: Self-pay

## 2020-12-07 ENCOUNTER — Encounter (HOSPITAL_BASED_OUTPATIENT_CLINIC_OR_DEPARTMENT_OTHER): Payer: Self-pay | Admitting: *Deleted

## 2020-12-07 ENCOUNTER — Ambulatory Visit (INDEPENDENT_AMBULATORY_CARE_PROVIDER_SITE_OTHER): Payer: Medicare Other | Admitting: Family Medicine

## 2020-12-07 ENCOUNTER — Encounter: Payer: Self-pay | Admitting: Family Medicine

## 2020-12-07 ENCOUNTER — Emergency Department (HOSPITAL_BASED_OUTPATIENT_CLINIC_OR_DEPARTMENT_OTHER)
Admission: EM | Admit: 2020-12-07 | Discharge: 2020-12-07 | Disposition: A | Discharge status: Home / Self Care | Payer: Medicare Other | Attending: Emergency Medicine | Admitting: Emergency Medicine

## 2020-12-07 ENCOUNTER — Emergency Department (HOSPITAL_BASED_OUTPATIENT_CLINIC_OR_DEPARTMENT_OTHER): Payer: Medicare Other

## 2020-12-07 VITALS — BP 132/82 | Temp 97.9°F | Resp 16 | Ht 65.0 in | Wt 159.0 lb

## 2020-12-07 DIAGNOSIS — Z7951 Long term (current) use of inhaled steroids: Secondary | ICD-10-CM | POA: Insufficient documentation

## 2020-12-07 DIAGNOSIS — Z79899 Other long term (current) drug therapy: Secondary | ICD-10-CM | POA: Diagnosis not present

## 2020-12-07 DIAGNOSIS — R410 Disorientation, unspecified: Secondary | ICD-10-CM | POA: Insufficient documentation

## 2020-12-07 DIAGNOSIS — R0602 Shortness of breath: Secondary | ICD-10-CM | POA: Diagnosis not present

## 2020-12-07 DIAGNOSIS — Z7901 Long term (current) use of anticoagulants: Secondary | ICD-10-CM | POA: Diagnosis not present

## 2020-12-07 DIAGNOSIS — I480 Paroxysmal atrial fibrillation: Secondary | ICD-10-CM

## 2020-12-07 DIAGNOSIS — E1140 Type 2 diabetes mellitus with diabetic neuropathy, unspecified: Secondary | ICD-10-CM | POA: Insufficient documentation

## 2020-12-07 DIAGNOSIS — Z87891 Personal history of nicotine dependence: Secondary | ICD-10-CM | POA: Insufficient documentation

## 2020-12-07 DIAGNOSIS — N390 Urinary tract infection, site not specified: Secondary | ICD-10-CM | POA: Diagnosis not present

## 2020-12-07 DIAGNOSIS — R059 Cough, unspecified: Secondary | ICD-10-CM

## 2020-12-07 DIAGNOSIS — I10 Essential (primary) hypertension: Secondary | ICD-10-CM | POA: Diagnosis not present

## 2020-12-07 DIAGNOSIS — J45909 Unspecified asthma, uncomplicated: Secondary | ICD-10-CM | POA: Diagnosis not present

## 2020-12-07 DIAGNOSIS — Z20822 Contact with and (suspected) exposure to covid-19: Secondary | ICD-10-CM | POA: Diagnosis not present

## 2020-12-07 DIAGNOSIS — I130 Hypertensive heart and chronic kidney disease with heart failure and stage 1 through stage 4 chronic kidney disease, or unspecified chronic kidney disease: Secondary | ICD-10-CM | POA: Diagnosis not present

## 2020-12-07 DIAGNOSIS — R0902 Hypoxemia: Secondary | ICD-10-CM | POA: Diagnosis not present

## 2020-12-07 DIAGNOSIS — I5022 Chronic systolic (congestive) heart failure: Secondary | ICD-10-CM | POA: Diagnosis not present

## 2020-12-07 DIAGNOSIS — R319 Hematuria, unspecified: Secondary | ICD-10-CM

## 2020-12-07 DIAGNOSIS — N183 Chronic kidney disease, stage 3 unspecified: Secondary | ICD-10-CM | POA: Diagnosis not present

## 2020-12-07 DIAGNOSIS — Z87440 Personal history of urinary (tract) infections: Secondary | ICD-10-CM | POA: Diagnosis not present

## 2020-12-07 DIAGNOSIS — E039 Hypothyroidism, unspecified: Secondary | ICD-10-CM | POA: Diagnosis not present

## 2020-12-07 DIAGNOSIS — R300 Dysuria: Secondary | ICD-10-CM | POA: Diagnosis not present

## 2020-12-07 DIAGNOSIS — Z794 Long term (current) use of insulin: Secondary | ICD-10-CM | POA: Diagnosis not present

## 2020-12-07 LAB — URINALYSIS, MICROSCOPIC (REFLEX)

## 2020-12-07 LAB — COMPREHENSIVE METABOLIC PANEL
ALT: 13 U/L (ref 0–44)
AST: 18 U/L (ref 15–41)
Albumin: 4 g/dL (ref 3.5–5.0)
Alkaline Phosphatase: 59 U/L (ref 38–126)
Anion gap: 8 (ref 5–15)
BUN: 28 mg/dL — ABNORMAL HIGH (ref 8–23)
CO2: 25 mmol/L (ref 22–32)
Calcium: 8.7 mg/dL — ABNORMAL LOW (ref 8.9–10.3)
Chloride: 104 mmol/L (ref 98–111)
Creatinine, Ser: 1.54 mg/dL — ABNORMAL HIGH (ref 0.44–1.00)
GFR, Estimated: 32 mL/min — ABNORMAL LOW (ref >60)
Glucose, Bld: 237 mg/dL — ABNORMAL HIGH (ref 70–99)
Potassium: 3.9 mmol/L (ref 3.5–5.1)
Sodium: 137 mmol/L (ref 135–145)
Total Bilirubin: 0.2 mg/dL — ABNORMAL LOW (ref 0.3–1.2)
Total Protein: 7.1 g/dL (ref 6.5–8.1)

## 2020-12-07 LAB — CBC WITH DIFFERENTIAL/PLATELET
Abs Immature Granulocytes: 0.03 10*3/uL (ref 0.00–0.07)
Basophils Absolute: 0.1 10*3/uL (ref 0.0–0.1)
Basophils Relative: 1 %
Eosinophils Absolute: 0.2 10*3/uL (ref 0.0–0.5)
Eosinophils Relative: 2 %
HCT: 38.5 % (ref 36.0–46.0)
Hemoglobin: 12.7 g/dL (ref 12.0–15.0)
Immature Granulocytes: 0 %
Lymphocytes Relative: 31 %
Lymphs Abs: 2.3 10*3/uL (ref 0.7–4.0)
MCH: 28.2 pg (ref 26.0–34.0)
MCHC: 33 g/dL (ref 30.0–36.0)
MCV: 85.6 fL (ref 80.0–100.0)
Monocytes Absolute: 0.7 10*3/uL (ref 0.1–1.0)
Monocytes Relative: 10 %
Neutro Abs: 4 10*3/uL (ref 1.7–7.7)
Neutrophils Relative %: 56 %
Platelets: 180 10*3/uL (ref 150–400)
RBC: 4.5 MIL/uL (ref 3.87–5.11)
RDW: 13.2 % (ref 11.5–15.5)
WBC: 7.3 10*3/uL (ref 4.0–10.5)
nRBC: 0 % (ref 0.0–0.2)

## 2020-12-07 LAB — URINALYSIS, ROUTINE W REFLEX MICROSCOPIC
Bilirubin Urine: NEGATIVE
Glucose, UA: >=500 mg/dL — AB
Hgb urine dipstick: NEGATIVE
Ketones, ur: NEGATIVE mg/dL
Nitrite: POSITIVE — AB
Protein, ur: NEGATIVE mg/dL
Specific Gravity, Urine: 1.025 (ref 1.005–1.030)
pH: 6 (ref 5.0–8.0)

## 2020-12-07 LAB — TROPONIN I (HIGH SENSITIVITY): Troponin I (High Sensitivity): 10 ng/L (ref <18)

## 2020-12-07 LAB — POCT URINALYSIS DIP (MANUAL ENTRY)
Bilirubin, UA: NEGATIVE
Glucose, UA: >=1000 mg/dL — AB
Ketones, POC UA: NEGATIVE mg/dL
Leukocytes, UA: NEGATIVE
Nitrite, UA: POSITIVE — AB
Spec Grav, UA: 1.025 (ref 1.010–1.025)
Urobilinogen, UA: 0.2 E.U./dL
pH, UA: 5 (ref 5.0–8.0)

## 2020-12-07 LAB — D-DIMER, QUANTITATIVE: D-Dimer, Quant: <0.27 ug/mL-FEU (ref 0.00–0.50)

## 2020-12-07 LAB — BRAIN NATRIURETIC PEPTIDE: B Natriuretic Peptide: 237.9 pg/mL — ABNORMAL HIGH (ref 0.0–100.0)

## 2020-12-07 MED ORDER — SULFAMETHOXAZOLE-TRIMETHOPRIM 800-160 MG PO TABS: 1.0000 | ORAL_TABLET | Freq: Two times a day (BID) | ORAL | 0 refills | Status: DC

## 2020-12-07 MED ORDER — CIPROFLOXACIN HCL 500 MG PO TABS: 500.0000 mg | ORAL_TABLET | Freq: Two times a day (BID) | ORAL | 0 refills | Status: AC

## 2020-12-07 MED ORDER — CIPROFLOXACIN HCL 500 MG PO TABS: 500.0000 mg | ORAL_TABLET | Freq: Two times a day (BID) | ORAL | 0 refills | Status: DC

## 2020-12-07 NOTE — Discharge Instructions (Signed)
You were evaluated in the Emergency Department and after careful evaluation, we did not find any emergent condition requiring admission or further testing in the hospital.  Please take the antibiotic as directed until finished  Please return to the Emergency Department if you experience any worsening of your condition.  We encourage you to follow up with a primary care provider.  Thank you for allowing [place] to be a part of your care.

## 2020-12-07 NOTE — ED Notes (Signed)
Portable chests done

## 2020-12-07 NOTE — ED Notes (Signed)
Patient taken back to room upon arrival due to complaint on low SAT. When she arrived, I checked SAT and she was 100% on RA. Patient has dark nail polish on, so unable to obtain correct SAT through the polish. 100% currently with good wave form. BBS clear. No distress noted. Patient stated she wheezes on occasion but none noted right now.

## 2020-12-07 NOTE — ED Triage Notes (Addendum)
Sent  by PMS for eval SOB.  SOb x 3 days, pt in no distress O2 sat 100% room air

## 2020-12-07 NOTE — ED Notes (Signed)
**Note De-identified  Obfuscation** Up to BSC.
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
## 2020-12-07 NOTE — Patient Instructions (Signed)
We are going to have you seen in the ER today to make sure you are all right!  I am sorry you are sick

## 2020-12-08 DIAGNOSIS — R319 Hematuria, unspecified: Secondary | ICD-10-CM | POA: Diagnosis not present

## 2020-12-08 LAB — URINALYSIS, MICROSCOPIC ONLY

## 2020-12-08 LAB — MICROALBUMIN / CREATININE URINE RATIO
Creatinine,U: 101.9 mg/dL
Microalb Creat Ratio: 3 mg/g (ref 0.0–30.0)
Microalb, Ur: 3 mg/dL — ABNORMAL HIGH (ref 0.0–1.9)

## 2020-12-08 LAB — SARS CORONAVIRUS 2 (TAT 6-24 HRS): SARS Coronavirus 2: NEGATIVE

## 2020-12-10 ENCOUNTER — Encounter: Payer: Self-pay | Admitting: Family Medicine

## 2020-12-10 DIAGNOSIS — R8279 Other abnormal findings on microbiological examination of urine: Secondary | ICD-10-CM

## 2020-12-10 LAB — URINE CULTURE
Culture: >=100000 — AB
MICRO NUMBER:: 11580061
SPECIMEN QUALITY:: ADEQUATE

## 2020-12-13 ENCOUNTER — Other Ambulatory Visit: Payer: Self-pay | Admitting: Neurology

## 2020-12-14 ENCOUNTER — Encounter: Payer: Self-pay | Admitting: Family Medicine

## 2020-12-14 DIAGNOSIS — Z794 Long term (current) use of insulin: Secondary | ICD-10-CM

## 2020-12-14 DIAGNOSIS — E1165 Type 2 diabetes mellitus with hyperglycemia: Secondary | ICD-10-CM

## 2020-12-15 ENCOUNTER — Telehealth: Payer: Self-pay | Admitting: Neurology

## 2020-12-15 MED ORDER — LEVEMIR FLEXTOUCH 100 UNIT/ML ~~LOC~~ SOPN: 25.0000 [IU] | PEN_INJECTOR | Freq: Every day | SUBCUTANEOUS | 3 refills | Status: DC

## 2020-12-15 NOTE — Telephone Encounter (Signed)
Young,Melissa [name](on DPR) has called re: the levETIRAcetam (KEPPRA) 250 MG tablet the 90 days should have been sent to [facility] for pt. Please send to [facility]

## 2020-12-15 NOTE — Addendum Note (Signed)
Addended by: Lamar Blinks C on: 12/15/2020 05:12 PM   Modules accepted: Orders

## 2020-12-18 ENCOUNTER — Encounter: Payer: Self-pay | Admitting: *Deleted

## 2020-12-18 ENCOUNTER — Other Ambulatory Visit: Payer: Self-pay | Admitting: *Deleted

## 2020-12-18 MED ORDER — BLOOD GLUCOSE MONITORING SUPPL DEVI: 0 refills | Status: DC

## 2020-12-18 MED ORDER — LEVETIRACETAM 250 MG PO TABS: 250.0000 mg | ORAL_TABLET | Freq: Every day | ORAL | 1 refills | Status: DC

## 2020-12-18 NOTE — Addendum Note (Signed)
Addended by: Darreld Mclean on: 12/18/2020 06:58 PM   Modules accepted: Orders

## 2020-12-18 NOTE — Telephone Encounter (Signed)
Levetiracetam Rx discontinued at [facility], sent to [facility]. Sent my chart to advise.

## 2020-12-22 ENCOUNTER — Other Ambulatory Visit: Payer: Self-pay

## 2020-12-22 ENCOUNTER — Other Ambulatory Visit: Payer: Medicare Other

## 2020-12-22 DIAGNOSIS — R300 Dysuria: Secondary | ICD-10-CM

## 2020-12-22 DIAGNOSIS — Z87440 Personal history of urinary (tract) infections: Secondary | ICD-10-CM

## 2020-12-23 LAB — URINE CULTURE
MICRO NUMBER:: 11637461
SPECIMEN QUALITY:: ADEQUATE

## 2020-12-25 NOTE — Addendum Note (Signed)
Addended by: Manuela Schwartz on: 12/25/2020 03:52 PM   Modules accepted: Orders

## 2020-12-29 ENCOUNTER — Other Ambulatory Visit: Payer: Self-pay | Admitting: Family Medicine

## 2021-01-03 ENCOUNTER — Other Ambulatory Visit: Payer: Self-pay | Admitting: Cardiology

## 2021-01-03 MED ORDER — APIXABAN 2.5 MG PO TABS: 2.5000 mg | ORAL_TABLET | Freq: Two times a day (BID) | ORAL | 1 refills | Status: DC

## 2021-01-03 NOTE — Telephone Encounter (Signed)
Pt last saw Dr [name] 08/31/20, last labs 12/07/20 Creat 1.54, age 87, weight 72.6kg, based on specified criteria pt is not on appropriate dosage of Eliquis.  Age >90, Creat >1.5 pt should be on Eliquis 2.5mg  BID for afib.  Please advise if dosage change is appropriate. Will forward message to Dr [name] to address, and await response to refill.

## 2021-01-03 NOTE — Telephone Encounter (Signed)
-----   Message from [name], MD sent at 01/03/2021  4:23 PM EDT ----- Thank you, Lets change the Eliquis to 2.5 mg twice daily. [name], MD  ----- Message ----- From: Brynda Peon, RN Sent: 01/03/2021   7:33 AM EDT To: [name], MD  Pt last saw Dr [name] 08/31/20, last labs 12/07/20 Creat 1.54, age 87, weight 72.6kg, based on specified criteria pt is not on appropriate dosage of Eliquis.  Age >90, Creat >1.5 pt should be on Eliquis 2.5mg  BID for afib.  Please advise if dosage change is appropriate. Thanks.

## 2021-01-03 NOTE — Telephone Encounter (Signed)
Called with Eliquis dosage change, spoke to [place] pt's grandaughter who manages all pt's meds and communicates changes to pt.  Explained reasons for dosage reduction due to most recent labwork in February and in Dec of 2021.  New rx [facility] to [facility]. Updated med list.  [name] expressed concern last time Eliquis dosage was reduced pt had increased episodes of afib and BP became elevated.  Advised Eliquis dosage change should not effect her afib or BP, it is an anticoagulant and thins pt's blood to prevent clot or stroke secondary to the afib.  When pt is on too high of a dosage Eliquis 5mg  BID vs 2.5mg  BID (based on age>90 and serum Creat >1.5) they are at risk for the drug to back up in their system and have an adverse bleeding event.  Advised her to monitor pt, post dosage change and call us back if pt experiences any problems with afib or BP as she was explaining happened in the past. [name] verbalized understanding and agreed to call us back with problems.

## 2021-01-06 ENCOUNTER — Other Ambulatory Visit: Payer: Self-pay | Admitting: Endocrinology

## 2021-01-08 ENCOUNTER — Encounter: Payer: Self-pay | Admitting: *Deleted

## 2021-01-08 ENCOUNTER — Other Ambulatory Visit: Payer: Self-pay | Admitting: *Deleted

## 2021-01-08 MED ORDER — PRAMIPEXOLE DIHYDROCHLORIDE 0.25 MG PO TABS: ORAL_TABLET | ORAL | 1 refills | Status: DC

## 2021-01-10 ENCOUNTER — Encounter: Payer: Self-pay | Admitting: Family Medicine

## 2021-01-10 DIAGNOSIS — G629 Polyneuropathy, unspecified: Secondary | ICD-10-CM

## 2021-01-10 MED ORDER — PREGABALIN 50 MG PO CAPS: 50.0000 mg | ORAL_CAPSULE | Freq: Every day | ORAL | 1 refills | Status: DC

## 2021-01-11 ENCOUNTER — Other Ambulatory Visit: Payer: Self-pay | Admitting: Family Medicine

## 2021-01-11 ENCOUNTER — Other Ambulatory Visit: Payer: Self-pay | Admitting: Endocrinology

## 2021-01-11 MED ORDER — CEPHALEXIN 250 MG PO CAPS: 250.0000 mg | ORAL_CAPSULE | Freq: Every day | ORAL | 3 refills | Status: DC

## 2021-01-11 NOTE — Addendum Note (Signed)
Addended by: Lamar Blinks C on: 01/11/2021 09:19 AM   Modules accepted: Orders

## 2021-01-13 ENCOUNTER — Other Ambulatory Visit: Payer: Self-pay | Admitting: Family Medicine

## 2021-01-13 MED ORDER — BLOOD GLUCOSE MONITOR KIT: PACK | 0 refills | Status: DC

## 2021-01-16 ENCOUNTER — Other Ambulatory Visit: Payer: Self-pay | Admitting: Family Medicine

## 2021-02-20 ENCOUNTER — Ambulatory Visit: Payer: BLUE CROSS/BLUE SHIELD | Admitting: Medical

## 2021-03-03 ENCOUNTER — Encounter: Payer: Self-pay | Admitting: Family Medicine

## 2021-03-04 NOTE — Patient Instructions (Addendum)
Good to see you again today! I will be in touch with your labs asap Please increase metoprolol to 50 mg daily Use the ondansetron as needed for nausea/ vomiting will also help with diarrhea We can get a CT scan if needed
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
## 2021-03-05 ENCOUNTER — Encounter: Payer: Self-pay | Admitting: Family Medicine

## 2021-03-05 ENCOUNTER — Ambulatory Visit (INDEPENDENT_AMBULATORY_CARE_PROVIDER_SITE_OTHER): Payer: Medicare Other | Admitting: Family Medicine

## 2021-03-05 ENCOUNTER — Other Ambulatory Visit: Payer: Self-pay

## 2021-03-05 VITALS — BP 122/80 | HR 110 | Temp 97.6°F | Resp 16 | Ht 64.0 in | Wt 158.0 lb

## 2021-03-05 DIAGNOSIS — E1140 Type 2 diabetes mellitus with diabetic neuropathy, unspecified: Secondary | ICD-10-CM

## 2021-03-05 DIAGNOSIS — R3129 Other microscopic hematuria: Secondary | ICD-10-CM | POA: Diagnosis not present

## 2021-03-05 DIAGNOSIS — Z794 Long term (current) use of insulin: Secondary | ICD-10-CM | POA: Diagnosis not present

## 2021-03-05 DIAGNOSIS — E876 Hypokalemia: Secondary | ICD-10-CM | POA: Diagnosis not present

## 2021-03-05 DIAGNOSIS — I480 Paroxysmal atrial fibrillation: Secondary | ICD-10-CM | POA: Diagnosis not present

## 2021-03-05 DIAGNOSIS — R102 Pelvic and perineal pain: Secondary | ICD-10-CM | POA: Diagnosis not present

## 2021-03-05 DIAGNOSIS — R112 Nausea with vomiting, unspecified: Secondary | ICD-10-CM

## 2021-03-05 DIAGNOSIS — E039 Hypothyroidism, unspecified: Secondary | ICD-10-CM

## 2021-03-05 DIAGNOSIS — Z87440 Personal history of urinary (tract) infections: Secondary | ICD-10-CM | POA: Diagnosis not present

## 2021-03-05 DIAGNOSIS — R197 Diarrhea, unspecified: Secondary | ICD-10-CM | POA: Diagnosis not present

## 2021-03-05 LAB — POCT URINALYSIS DIP (MANUAL ENTRY)
Bilirubin, UA: NEGATIVE
Glucose, UA: NEGATIVE mg/dL
Ketones, POC UA: NEGATIVE mg/dL
Nitrite, UA: NEGATIVE
Protein Ur, POC: 30 mg/dL — AB
Spec Grav, UA: 1.02 (ref 1.010–1.025)
Urobilinogen, UA: 0.2 E.U./dL
pH, UA: 5 (ref 5.0–8.0)

## 2021-03-05 LAB — COMPREHENSIVE METABOLIC PANEL
ALT: 11 U/L (ref 0–35)
AST: 11 U/L (ref 0–37)
Albumin: 4 g/dL (ref 3.5–5.2)
Alkaline Phosphatase: 64 U/L (ref 39–117)
BUN: 33 mg/dL — ABNORMAL HIGH (ref 6–23)
CO2: 30 mEq/L (ref 19–32)
Calcium: 9.1 mg/dL (ref 8.4–10.5)
Chloride: 101 mEq/L (ref 96–112)
Creatinine, Ser: 1.92 mg/dL — ABNORMAL HIGH (ref 0.40–1.20)
GFR: 22.6 mL/min — ABNORMAL LOW (ref >60.00)
Glucose, Bld: 177 mg/dL — ABNORMAL HIGH (ref 70–99)
Potassium: 5.2 mEq/L — ABNORMAL HIGH (ref 3.5–5.1)
Sodium: 139 mEq/L (ref 135–145)
Total Bilirubin: 0.4 mg/dL (ref 0.2–1.2)
Total Protein: 6.9 g/dL (ref 6.0–8.3)

## 2021-03-05 LAB — CBC
HCT: 38.2 % (ref 36.0–46.0)
Hemoglobin: 12.7 g/dL (ref 12.0–15.0)
MCHC: 33.2 g/dL (ref 30.0–36.0)
MCV: 85.2 fl (ref 78.0–100.0)
Platelets: 225 10*3/uL (ref 150.0–400.0)
RBC: 4.48 Mil/uL (ref 3.87–5.11)
RDW: 13.5 % (ref 11.5–15.5)
WBC: 10.1 10*3/uL (ref 4.0–10.5)

## 2021-03-05 LAB — HEMOGLOBIN A1C: Hgb A1c MFr Bld: 8.2 % — ABNORMAL HIGH (ref 4.6–6.5)

## 2021-03-05 LAB — LIPASE: Lipase: 49 U/L (ref 11.0–59.0)

## 2021-03-05 LAB — TSH: TSH: 2.78 u[IU]/mL (ref 0.35–4.50)

## 2021-03-05 MED ORDER — METOPROLOL SUCCINATE ER 50 MG PO TB24: 50.0000 mg | ORAL_TABLET | Freq: Every day | ORAL | 3 refills | Status: DC

## 2021-03-05 MED ORDER — ONDANSETRON HCL 8 MG PO TABS: 8.0000 mg | ORAL_TABLET | Freq: Three times a day (TID) | ORAL | 1 refills | Status: DC | PRN

## 2021-03-06 LAB — URINE CULTURE
MICRO NUMBER:: 11922150
Result:: NO GROWTH
SPECIMEN QUALITY:: ADEQUATE

## 2021-03-07 ENCOUNTER — Encounter: Payer: Self-pay | Admitting: Family Medicine

## 2021-03-09 ENCOUNTER — Emergency Department (HOSPITAL_BASED_OUTPATIENT_CLINIC_OR_DEPARTMENT_OTHER): Payer: Medicare Other

## 2021-03-09 ENCOUNTER — Other Ambulatory Visit: Payer: Self-pay

## 2021-03-09 ENCOUNTER — Encounter (HOSPITAL_BASED_OUTPATIENT_CLINIC_OR_DEPARTMENT_OTHER): Payer: Self-pay | Admitting: Emergency Medicine

## 2021-03-09 ENCOUNTER — Other Ambulatory Visit (HOSPITAL_BASED_OUTPATIENT_CLINIC_OR_DEPARTMENT_OTHER): Payer: Self-pay

## 2021-03-09 ENCOUNTER — Emergency Department (HOSPITAL_BASED_OUTPATIENT_CLINIC_OR_DEPARTMENT_OTHER)
Admission: EM | Admit: 2021-03-09 | Discharge: 2021-03-09 | Disposition: A | Discharge status: Home / Self Care | Payer: Medicare Other | Attending: Emergency Medicine | Admitting: Emergency Medicine

## 2021-03-09 DIAGNOSIS — Z7901 Long term (current) use of anticoagulants: Secondary | ICD-10-CM | POA: Diagnosis not present

## 2021-03-09 DIAGNOSIS — M4312 Spondylolisthesis, cervical region: Secondary | ICD-10-CM | POA: Diagnosis not present

## 2021-03-09 DIAGNOSIS — I10 Essential (primary) hypertension: Secondary | ICD-10-CM | POA: Diagnosis not present

## 2021-03-09 DIAGNOSIS — W07XXXA Fall from chair, initial encounter: Secondary | ICD-10-CM | POA: Insufficient documentation

## 2021-03-09 DIAGNOSIS — Z20822 Contact with and (suspected) exposure to covid-19: Secondary | ICD-10-CM | POA: Diagnosis not present

## 2021-03-09 DIAGNOSIS — S93402A Sprain of unspecified ligament of left ankle, initial encounter: Secondary | ICD-10-CM | POA: Insufficient documentation

## 2021-03-09 DIAGNOSIS — Z87891 Personal history of nicotine dependence: Secondary | ICD-10-CM | POA: Diagnosis not present

## 2021-03-09 DIAGNOSIS — Y9289 Other specified places as the place of occurrence of the external cause: Secondary | ICD-10-CM | POA: Diagnosis not present

## 2021-03-09 DIAGNOSIS — J45909 Unspecified asthma, uncomplicated: Secondary | ICD-10-CM | POA: Insufficient documentation

## 2021-03-09 DIAGNOSIS — N3000 Acute cystitis without hematuria: Secondary | ICD-10-CM | POA: Diagnosis not present

## 2021-03-09 DIAGNOSIS — R197 Diarrhea, unspecified: Secondary | ICD-10-CM | POA: Insufficient documentation

## 2021-03-09 DIAGNOSIS — R531 Weakness: Secondary | ICD-10-CM | POA: Diagnosis not present

## 2021-03-09 DIAGNOSIS — R600 Localized edema: Secondary | ICD-10-CM | POA: Diagnosis not present

## 2021-03-09 DIAGNOSIS — R059 Cough, unspecified: Secondary | ICD-10-CM | POA: Diagnosis not present

## 2021-03-09 DIAGNOSIS — R112 Nausea with vomiting, unspecified: Secondary | ICD-10-CM | POA: Insufficient documentation

## 2021-03-09 DIAGNOSIS — Z794 Long term (current) use of insulin: Secondary | ICD-10-CM | POA: Insufficient documentation

## 2021-03-09 DIAGNOSIS — Z79899 Other long term (current) drug therapy: Secondary | ICD-10-CM | POA: Insufficient documentation

## 2021-03-09 DIAGNOSIS — Z043 Encounter for examination and observation following other accident: Secondary | ICD-10-CM | POA: Diagnosis not present

## 2021-03-09 DIAGNOSIS — I130 Hypertensive heart and chronic kidney disease with heart failure and stage 1 through stage 4 chronic kidney disease, or unspecified chronic kidney disease: Secondary | ICD-10-CM | POA: Diagnosis not present

## 2021-03-09 DIAGNOSIS — E039 Hypothyroidism, unspecified: Secondary | ICD-10-CM | POA: Insufficient documentation

## 2021-03-09 DIAGNOSIS — I5022 Chronic systolic (congestive) heart failure: Secondary | ICD-10-CM | POA: Insufficient documentation

## 2021-03-09 DIAGNOSIS — E1122 Type 2 diabetes mellitus with diabetic chronic kidney disease: Secondary | ICD-10-CM | POA: Insufficient documentation

## 2021-03-09 DIAGNOSIS — R0602 Shortness of breath: Secondary | ICD-10-CM | POA: Diagnosis not present

## 2021-03-09 DIAGNOSIS — S99912A Unspecified injury of left ankle, initial encounter: Secondary | ICD-10-CM | POA: Diagnosis present

## 2021-03-09 DIAGNOSIS — Z743 Need for continuous supervision: Secondary | ICD-10-CM | POA: Diagnosis not present

## 2021-03-09 DIAGNOSIS — K5750 Diverticulosis of both small and large intestine without perforation or abscess without bleeding: Secondary | ICD-10-CM | POA: Diagnosis not present

## 2021-03-09 DIAGNOSIS — N183 Chronic kidney disease, stage 3 unspecified: Secondary | ICD-10-CM | POA: Insufficient documentation

## 2021-03-09 DIAGNOSIS — M50323 Other cervical disc degeneration at C6-C7 level: Secondary | ICD-10-CM | POA: Diagnosis not present

## 2021-03-09 DIAGNOSIS — R0902 Hypoxemia: Secondary | ICD-10-CM | POA: Diagnosis not present

## 2021-03-09 DIAGNOSIS — K3189 Other diseases of stomach and duodenum: Secondary | ICD-10-CM | POA: Diagnosis not present

## 2021-03-09 DIAGNOSIS — M50322 Other cervical disc degeneration at C5-C6 level: Secondary | ICD-10-CM | POA: Diagnosis not present

## 2021-03-09 DIAGNOSIS — K449 Diaphragmatic hernia without obstruction or gangrene: Secondary | ICD-10-CM | POA: Diagnosis not present

## 2021-03-09 LAB — COMPREHENSIVE METABOLIC PANEL
ALT: 11 U/L (ref 0–44)
AST: 18 U/L (ref 15–41)
Albumin: 3.6 g/dL (ref 3.5–5.0)
Alkaline Phosphatase: 61 U/L (ref 38–126)
Anion gap: 6 (ref 5–15)
BUN: 38 mg/dL — ABNORMAL HIGH (ref 8–23)
CO2: 27 mmol/L (ref 22–32)
Calcium: 8.8 mg/dL — ABNORMAL LOW (ref 8.9–10.3)
Chloride: 103 mmol/L (ref 98–111)
Creatinine, Ser: 2.09 mg/dL — ABNORMAL HIGH (ref 0.44–1.00)
GFR, Estimated: 22 mL/min — ABNORMAL LOW (ref >60)
Glucose, Bld: 138 mg/dL — ABNORMAL HIGH (ref 70–99)
Potassium: 5.8 mmol/L — ABNORMAL HIGH (ref 3.5–5.1)
Sodium: 136 mmol/L (ref 135–145)
Total Bilirubin: 0.4 mg/dL (ref 0.3–1.2)
Total Protein: 6.8 g/dL (ref 6.5–8.1)

## 2021-03-09 LAB — CBC WITH DIFFERENTIAL/PLATELET
Abs Immature Granulocytes: 0.04 10*3/uL (ref 0.00–0.07)
Basophils Absolute: 0 10*3/uL (ref 0.0–0.1)
Basophils Relative: 0 %
Eosinophils Absolute: 0.1 10*3/uL (ref 0.0–0.5)
Eosinophils Relative: 2 %
HCT: 41 % (ref 36.0–46.0)
Hemoglobin: 12.9 g/dL (ref 12.0–15.0)
Immature Granulocytes: 1 %
Lymphocytes Relative: 22 %
Lymphs Abs: 1.6 10*3/uL (ref 0.7–4.0)
MCH: 28 pg (ref 26.0–34.0)
MCHC: 31.5 g/dL (ref 30.0–36.0)
MCV: 88.9 fL (ref 80.0–100.0)
Monocytes Absolute: 0.4 10*3/uL (ref 0.1–1.0)
Monocytes Relative: 6 %
Neutro Abs: 5.1 10*3/uL (ref 1.7–7.7)
Neutrophils Relative %: 69 %
Platelets: 205 10*3/uL (ref 150–400)
RBC: 4.61 MIL/uL (ref 3.87–5.11)
RDW: 13.5 % (ref 11.5–15.5)
WBC: 7.3 10*3/uL (ref 4.0–10.5)
nRBC: 0 % (ref 0.0–0.2)

## 2021-03-09 LAB — LACTIC ACID, PLASMA: Lactic Acid, Venous: 0.8 mmol/L (ref 0.5–1.9)

## 2021-03-09 LAB — RESP PANEL BY RT-PCR (FLU A&B, COVID) ARPGX2
Influenza A by PCR: NEGATIVE
Influenza B by PCR: NEGATIVE
SARS Coronavirus 2 by RT PCR: NEGATIVE

## 2021-03-09 LAB — URINALYSIS, MICROSCOPIC (REFLEX): WBC, UA: >50 WBC/hpf (ref 0–5)

## 2021-03-09 LAB — URINALYSIS, ROUTINE W REFLEX MICROSCOPIC
Bilirubin Urine: NEGATIVE
Glucose, UA: NEGATIVE mg/dL
Ketones, ur: NEGATIVE mg/dL
Nitrite: NEGATIVE
Protein, ur: NEGATIVE mg/dL
Specific Gravity, Urine: 1.02 (ref 1.005–1.030)
pH: 5.5 (ref 5.0–8.0)

## 2021-03-09 LAB — TROPONIN I (HIGH SENSITIVITY): Troponin I (High Sensitivity): 4 ng/L (ref <18)

## 2021-03-09 LAB — LIPASE, BLOOD: Lipase: 37 U/L (ref 11–51)

## 2021-03-09 LAB — BRAIN NATRIURETIC PEPTIDE: B Natriuretic Peptide: 208 pg/mL — ABNORMAL HIGH (ref 0.0–100.0)

## 2021-03-09 MED ORDER — CEPHALEXIN 500 MG PO CAPS
500.0000 mg | ORAL_CAPSULE | Freq: Two times a day (BID) | ORAL | 0 refills | Status: DC
  Filled 2021-03-09: qty 14, 7d supply, fill #0

## 2021-03-09 MED ORDER — SODIUM ZIRCONIUM CYCLOSILICATE 10 G PO PACK: 10.0000 g | PACK | Freq: Once | ORAL | Status: DC

## 2021-03-09 MED ORDER — SODIUM CHLORIDE 0.9 % IV BOLUS
500.0000 mL | Freq: Once | INTRAVENOUS | Status: AC
  Administered 2021-03-09: 500 mL via INTRAVENOUS

## 2021-03-09 NOTE — ED Notes (Signed)
ED Provider at bedside. 

## 2021-03-09 NOTE — Discharge Instructions (Signed)
Increase her dose of Keflex to 500 twice a day for the next 7 days.  Recommend ice and Tylenol for her left ankle sprain.  Follow-up with your primary care doctor as you may need physical therapy.

## 2021-03-09 NOTE — ED Triage Notes (Signed)
Per ems per daughter pt slid of chair to floor, no injury. Family believes pt is dehydrated. Weakness to leg, normally uses cane but for the last 2 days has been using walker.

## 2021-03-09 NOTE — ED Notes (Signed)
Pt placed on bedpan, attempt to collect urine specimen

## 2021-03-09 NOTE — ED Provider Notes (Signed)
Patient handed off to me at 3 PM.  Patient here with generalized weakness.  Suspicion that she has diverticulitis and is on supportive therapy with Zofran for that.  History of chronic UTIs and is on prophylactic Keflex.  Has been sleeping more than usual this past week.  Has had some diarrhea.  Lasix is currently held per primary care doctor.  She has been trying to focus on hydration.  She slid out of her chair today and was unable to get up.  He is on a blood thinner but denies losing consciousness or hitting her head.  She is having some pain in her left foot as it got caught up underneath her.  Infectious work-up and trauma work-up has been initiated.  Lab work is overall unremarkable.  Creatinine mildly elevated.  Potassium 5.8 but with hemolysis.  EKG showed rate controlled A. fib.  Urinalysis equivocal for infection.  She is on chronic Keflex.  We will have her increase her dose for the next week and send off a urine culture.  Imaging of the head and neck was unremarkable.  No significant findings on abdominal CT.  No leukocytosis or lactic acidosis.  No concern for sepsis.  She was given fluid hydration.  Suspect some mild dehydration.  She has had recent diarrhea.  X-rays of the left ankle and foot are normal except for some mild swelling and overall suspect an ankle sprain from her fall today.  We will have her use a walking boot with her walker and follow-up with primary care doctor.  Discharged in good condition.  This chart was dictated using voice recognition software.  Despite best efforts to proofread,  errors can occur which can change the documentation meaning.     Lennice Sites, DO 03/09/21 1727
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
## 2021-03-09 NOTE — ED Notes (Signed)
Patient transported to Radiology for CT and XR

## 2021-03-11 LAB — URINE CULTURE

## 2021-03-13 ENCOUNTER — Encounter: Payer: Self-pay | Admitting: Family Medicine

## 2021-03-13 DIAGNOSIS — R3129 Other microscopic hematuria: Secondary | ICD-10-CM

## 2021-03-13 DIAGNOSIS — Z87440 Personal history of urinary (tract) infections: Secondary | ICD-10-CM
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
## 2021-03-19 ENCOUNTER — Ambulatory Visit (HOSPITAL_BASED_OUTPATIENT_CLINIC_OR_DEPARTMENT_OTHER)
Admission: RE | Admit: 2021-03-19 | Discharge: 2021-03-19 | Disposition: A | Discharge status: Home / Self Care | Payer: Medicare Other | Source: Ambulatory Visit | Attending: Family Medicine | Admitting: Family Medicine

## 2021-03-19 ENCOUNTER — Other Ambulatory Visit: Payer: Self-pay

## 2021-03-19 ENCOUNTER — Encounter: Payer: Self-pay | Admitting: Family Medicine

## 2021-03-19 ENCOUNTER — Ambulatory Visit (INDEPENDENT_AMBULATORY_CARE_PROVIDER_SITE_OTHER): Payer: Medicare Other | Admitting: Family Medicine

## 2021-03-19 VITALS — BP 112/60 | HR 96 | Temp 97.7°F | Resp 18 | Wt 157.6 lb

## 2021-03-19 DIAGNOSIS — M25571 Pain in right ankle and joints of right foot: Secondary | ICD-10-CM | POA: Diagnosis not present

## 2021-03-19 DIAGNOSIS — R300 Dysuria: Secondary | ICD-10-CM

## 2021-03-19 DIAGNOSIS — Z87440 Personal history of urinary (tract) infections: Secondary | ICD-10-CM

## 2021-03-19 DIAGNOSIS — M7989 Other specified soft tissue disorders: Secondary | ICD-10-CM | POA: Diagnosis not present

## 2021-03-19 DIAGNOSIS — N289 Disorder of kidney and ureter, unspecified: Secondary | ICD-10-CM

## 2021-03-19 DIAGNOSIS — M79671 Pain in right foot: Secondary | ICD-10-CM

## 2021-03-19 LAB — POCT URINALYSIS DIPSTICK
Bilirubin, UA: NEGATIVE
Blood, UA: NEGATIVE
Glucose, UA: NEGATIVE
Ketones, UA: NEGATIVE
Nitrite, UA: NEGATIVE
Protein, UA: POSITIVE — AB
Spec Grav, UA: 1.025 (ref 1.010–1.025)
Urobilinogen, UA: 0.2 E.U./dL
pH, UA: 5 (ref 5.0–8.0)

## 2021-03-19 NOTE — Patient Instructions (Signed)
Good to see you again today!  Please go by the lab and then have x-rays I will be in touch with your results asap!

## 2021-03-20 ENCOUNTER — Encounter: Payer: Self-pay | Admitting: Family Medicine

## 2021-03-20 LAB — BASIC METABOLIC PANEL
BUN: 22 mg/dL (ref 6–23)
CO2: 24 mEq/L (ref 19–32)
Calcium: 8.7 mg/dL (ref 8.4–10.5)
Chloride: 101 mEq/L (ref 96–112)
Creatinine, Ser: 1.45 mg/dL — ABNORMAL HIGH (ref 0.40–1.20)
GFR: 31.65 mL/min — ABNORMAL LOW (ref >60.00)
Glucose, Bld: 173 mg/dL — ABNORMAL HIGH (ref 70–99)
Potassium: 4.2 mEq/L (ref 3.5–5.1)
Sodium: 140 mEq/L (ref 135–145)

## 2021-03-20 LAB — URINE CULTURE
MICRO NUMBER:: 11972696
Result:: NO GROWTH
SPECIMEN QUALITY:: ADEQUATE

## 2021-04-06 ENCOUNTER — Encounter (HOSPITAL_BASED_OUTPATIENT_CLINIC_OR_DEPARTMENT_OTHER): Payer: Self-pay

## 2021-04-06 ENCOUNTER — Inpatient Hospital Stay (HOSPITAL_BASED_OUTPATIENT_CLINIC_OR_DEPARTMENT_OTHER)
Admission: EM | Admit: 2021-04-06 | Discharge: 2021-04-10 | DRG: 194 | Disposition: A | Discharge status: Home Health | Payer: Medicare Other | Attending: Internal Medicine | Admitting: Internal Medicine

## 2021-04-06 ENCOUNTER — Other Ambulatory Visit: Payer: Self-pay

## 2021-04-06 ENCOUNTER — Emergency Department (HOSPITAL_BASED_OUTPATIENT_CLINIC_OR_DEPARTMENT_OTHER): Payer: Medicare Other

## 2021-04-06 DIAGNOSIS — D6869 Other thrombophilia: Secondary | ICD-10-CM

## 2021-04-06 DIAGNOSIS — D649 Anemia, unspecified: Secondary | ICD-10-CM | POA: Diagnosis not present

## 2021-04-06 DIAGNOSIS — I480 Paroxysmal atrial fibrillation: Secondary | ICD-10-CM | POA: Diagnosis present

## 2021-04-06 DIAGNOSIS — E1142 Type 2 diabetes mellitus with diabetic polyneuropathy: Secondary | ICD-10-CM | POA: Diagnosis present

## 2021-04-06 DIAGNOSIS — D6859 Other primary thrombophilia: Secondary | ICD-10-CM | POA: Diagnosis not present

## 2021-04-06 DIAGNOSIS — G2581 Restless legs syndrome: Secondary | ICD-10-CM | POA: Diagnosis present

## 2021-04-06 DIAGNOSIS — K2970 Gastritis, unspecified, without bleeding: Secondary | ICD-10-CM | POA: Diagnosis present

## 2021-04-06 DIAGNOSIS — Z7901 Long term (current) use of anticoagulants: Secondary | ICD-10-CM | POA: Diagnosis not present

## 2021-04-06 DIAGNOSIS — Z20822 Contact with and (suspected) exposure to covid-19: Secondary | ICD-10-CM | POA: Diagnosis present

## 2021-04-06 DIAGNOSIS — E1122 Type 2 diabetes mellitus with diabetic chronic kidney disease: Secondary | ICD-10-CM | POA: Diagnosis not present

## 2021-04-06 DIAGNOSIS — I4891 Unspecified atrial fibrillation: Secondary | ICD-10-CM | POA: Diagnosis present

## 2021-04-06 DIAGNOSIS — E860 Dehydration: Secondary | ICD-10-CM | POA: Diagnosis not present

## 2021-04-06 DIAGNOSIS — J4541 Moderate persistent asthma with (acute) exacerbation: Secondary | ICD-10-CM | POA: Diagnosis not present

## 2021-04-06 DIAGNOSIS — J449 Chronic obstructive pulmonary disease, unspecified: Secondary | ICD-10-CM

## 2021-04-06 DIAGNOSIS — J479 Bronchiectasis, uncomplicated: Secondary | ICD-10-CM

## 2021-04-06 DIAGNOSIS — Z87891 Personal history of nicotine dependence: Secondary | ICD-10-CM

## 2021-04-06 DIAGNOSIS — Z6828 Body mass index (BMI) 28.0-28.9, adult: Secondary | ICD-10-CM | POA: Diagnosis not present

## 2021-04-06 DIAGNOSIS — Z794 Long term (current) use of insulin: Secondary | ICD-10-CM | POA: Diagnosis not present

## 2021-04-06 DIAGNOSIS — E1140 Type 2 diabetes mellitus with diabetic neuropathy, unspecified: Secondary | ICD-10-CM | POA: Diagnosis not present

## 2021-04-06 DIAGNOSIS — Z79899 Other long term (current) drug therapy: Secondary | ICD-10-CM | POA: Diagnosis not present

## 2021-04-06 DIAGNOSIS — J189 Pneumonia, unspecified organism: Secondary | ICD-10-CM | POA: Diagnosis not present

## 2021-04-06 DIAGNOSIS — K219 Gastro-esophageal reflux disease without esophagitis: Secondary | ICD-10-CM | POA: Diagnosis present

## 2021-04-06 DIAGNOSIS — I130 Hypertensive heart and chronic kidney disease with heart failure and stage 1 through stage 4 chronic kidney disease, or unspecified chronic kidney disease: Secondary | ICD-10-CM | POA: Diagnosis present

## 2021-04-06 DIAGNOSIS — Z8249 Family history of ischemic heart disease and other diseases of the circulatory system: Secondary | ICD-10-CM

## 2021-04-06 DIAGNOSIS — Z823 Family history of stroke: Secondary | ICD-10-CM

## 2021-04-06 DIAGNOSIS — I509 Heart failure, unspecified: Secondary | ICD-10-CM | POA: Diagnosis not present

## 2021-04-06 DIAGNOSIS — R059 Cough, unspecified: Secondary | ICD-10-CM | POA: Diagnosis not present

## 2021-04-06 DIAGNOSIS — R1310 Dysphagia, unspecified: Secondary | ICD-10-CM | POA: Diagnosis not present

## 2021-04-06 DIAGNOSIS — J470 Bronchiectasis with acute lower respiratory infection: Secondary | ICD-10-CM | POA: Diagnosis present

## 2021-04-06 DIAGNOSIS — Z79890 Hormone replacement therapy (postmenopausal): Secondary | ICD-10-CM

## 2021-04-06 DIAGNOSIS — R0600 Dyspnea, unspecified: Secondary | ICD-10-CM | POA: Diagnosis present

## 2021-04-06 DIAGNOSIS — E039 Hypothyroidism, unspecified: Secondary | ICD-10-CM | POA: Diagnosis present

## 2021-04-06 DIAGNOSIS — I428 Other cardiomyopathies: Secondary | ICD-10-CM

## 2021-04-06 DIAGNOSIS — N1832 Chronic kidney disease, stage 3b: Secondary | ICD-10-CM

## 2021-04-06 DIAGNOSIS — I5022 Chronic systolic (congestive) heart failure: Secondary | ICD-10-CM | POA: Diagnosis not present

## 2021-04-06 DIAGNOSIS — Z8673 Personal history of transient ischemic attack (TIA), and cerebral infarction without residual deficits: Secondary | ICD-10-CM

## 2021-04-06 DIAGNOSIS — R54 Age-related physical debility: Secondary | ICD-10-CM | POA: Diagnosis present

## 2021-04-06 DIAGNOSIS — K449 Diaphragmatic hernia without obstruction or gangrene: Secondary | ICD-10-CM | POA: Diagnosis present

## 2021-04-06 DIAGNOSIS — R0602 Shortness of breath: Secondary | ICD-10-CM | POA: Diagnosis not present

## 2021-04-06 DIAGNOSIS — M170 Bilateral primary osteoarthritis of knee: Secondary | ICD-10-CM | POA: Diagnosis present

## 2021-04-06 DIAGNOSIS — G43909 Migraine, unspecified, not intractable, without status migrainosus: Secondary | ICD-10-CM | POA: Diagnosis present

## 2021-04-06 DIAGNOSIS — N183 Chronic kidney disease, stage 3 unspecified: Secondary | ICD-10-CM | POA: Diagnosis present

## 2021-04-06 DIAGNOSIS — J45909 Unspecified asthma, uncomplicated: Secondary | ICD-10-CM | POA: Diagnosis not present

## 2021-04-06 DIAGNOSIS — R627 Adult failure to thrive: Secondary | ICD-10-CM | POA: Diagnosis present

## 2021-04-06 LAB — BASIC METABOLIC PANEL
Anion gap: 7 (ref 5–15)
BUN: 21 mg/dL (ref 8–23)
CO2: 27 mmol/L (ref 22–32)
Calcium: 9.1 mg/dL (ref 8.9–10.3)
Chloride: 103 mmol/L (ref 98–111)
Creatinine, Ser: 1.35 mg/dL — ABNORMAL HIGH (ref 0.44–1.00)
GFR, Estimated: 37 mL/min — ABNORMAL LOW (ref >60)
Glucose, Bld: 226 mg/dL — ABNORMAL HIGH (ref 70–99)
Potassium: 3.8 mmol/L (ref 3.5–5.1)
Sodium: 137 mmol/L (ref 135–145)

## 2021-04-06 LAB — CBC WITH DIFFERENTIAL/PLATELET
Abs Immature Granulocytes: 0.03 10*3/uL (ref 0.00–0.07)
Basophils Absolute: 0.1 10*3/uL (ref 0.0–0.1)
Basophils Relative: 1 %
Eosinophils Absolute: 0.1 10*3/uL (ref 0.0–0.5)
Eosinophils Relative: 2 %
HCT: 39.2 % (ref 36.0–46.0)
Hemoglobin: 12.3 g/dL (ref 12.0–15.0)
Immature Granulocytes: 0 %
Lymphocytes Relative: 15 %
Lymphs Abs: 1.1 10*3/uL (ref 0.7–4.0)
MCH: 28 pg (ref 26.0–34.0)
MCHC: 31.4 g/dL (ref 30.0–36.0)
MCV: 89.1 fL (ref 80.0–100.0)
Monocytes Absolute: 0.4 10*3/uL (ref 0.1–1.0)
Monocytes Relative: 6 %
Neutro Abs: 5.5 10*3/uL (ref 1.7–7.7)
Neutrophils Relative %: 76 %
Platelets: 177 10*3/uL (ref 150–400)
RBC: 4.4 MIL/uL (ref 3.87–5.11)
RDW: 13.6 % (ref 11.5–15.5)
WBC: 7.2 10*3/uL (ref 4.0–10.5)
nRBC: 0 % (ref 0.0–0.2)

## 2021-04-06 LAB — LACTIC ACID, PLASMA: Lactic Acid, Venous: 1.7 mmol/L (ref 0.5–1.9)

## 2021-04-06 LAB — MRSA PCR SCREENING: MRSA by PCR: NEGATIVE

## 2021-04-06 LAB — BRAIN NATRIURETIC PEPTIDE: B Natriuretic Peptide: 210.7 pg/mL — ABNORMAL HIGH (ref 0.0–100.0)

## 2021-04-06 LAB — RESP PANEL BY RT-PCR (FLU A&B, COVID) ARPGX2
Influenza A by PCR: NEGATIVE
Influenza B by PCR: NEGATIVE
SARS Coronavirus 2 by RT PCR: NEGATIVE

## 2021-04-06 LAB — TROPONIN I (HIGH SENSITIVITY): Troponin I (High Sensitivity): 6 ng/L (ref <18)

## 2021-04-06 LAB — GLUCOSE, CAPILLARY: Glucose-Capillary: 334 mg/dL — ABNORMAL HIGH (ref 70–99)

## 2021-04-06 MED ORDER — ARFORMOTEROL TARTRATE 15 MCG/2ML IN NEBU
15.0000 ug | INHALATION_SOLUTION | Freq: Two times a day (BID) | RESPIRATORY_TRACT | Status: DC
  Administered 2021-04-06 – 2021-04-10 (×8): 15 ug via RESPIRATORY_TRACT
  Filled 2021-04-06 (×8): qty 2

## 2021-04-06 MED ORDER — SODIUM CHLORIDE 0.9% FLUSH
3.0000 mL | Freq: Two times a day (BID) | INTRAVENOUS | Status: DC
  Administered 2021-04-06 – 2021-04-10 (×7): 3 mL via INTRAVENOUS

## 2021-04-06 MED ORDER — SODIUM CHLORIDE 0.9 % IV SOLN
500.0000 mg | INTRAVENOUS | Status: DC
  Administered 2021-04-07 – 2021-04-08 (×2): 500 mg via INTRAVENOUS
  Filled 2021-04-06 (×2): qty 500

## 2021-04-06 MED ORDER — INSULIN ASPART 100 UNIT/ML IJ SOLN
0.0000 [IU] | Freq: Every day | INTRAMUSCULAR | Status: DC
  Administered 2021-04-06: 4 [IU] via SUBCUTANEOUS

## 2021-04-06 MED ORDER — ONDANSETRON HCL 4 MG/2ML IJ SOLN
4.0000 mg | Freq: Four times a day (QID) | INTRAMUSCULAR | Status: DC | PRN
  Administered 2021-04-09: 4 mg via INTRAVENOUS
  Filled 2021-04-06: qty 2

## 2021-04-06 MED ORDER — TRAZODONE HCL 100 MG PO TABS
100.0000 mg | ORAL_TABLET | Freq: Every day | ORAL | Status: DC
  Administered 2021-04-06 – 2021-04-09 (×4): 100 mg via ORAL
  Filled 2021-04-06: qty 1
  Filled 2021-04-06: qty 2
  Filled 2021-04-06: qty 1
  Filled 2021-04-06: qty 2

## 2021-04-06 MED ORDER — SODIUM CHLORIDE 0.9 % IV SOLN
2.0000 g | INTRAVENOUS | Status: DC
  Administered 2021-04-07 – 2021-04-10 (×4): 2 g via INTRAVENOUS
  Filled 2021-04-06: qty 2
  Filled 2021-04-06 (×2): qty 20
  Filled 2021-04-06: qty 2

## 2021-04-06 MED ORDER — PANTOPRAZOLE SODIUM 40 MG PO TBEC
40.0000 mg | DELAYED_RELEASE_TABLET | Freq: Every day | ORAL | Status: DC
  Administered 2021-04-06: 40 mg via ORAL
  Filled 2021-04-06: qty 1

## 2021-04-06 MED ORDER — INSULIN DETEMIR 100 UNIT/ML ~~LOC~~ SOLN
25.0000 [IU] | Freq: Every day | SUBCUTANEOUS | Status: DC
  Administered 2021-04-06 – 2021-04-08 (×3): 25 [IU] via SUBCUTANEOUS
  Filled 2021-04-06 (×3): qty 0.25

## 2021-04-06 MED ORDER — REVEFENACIN 175 MCG/3ML IN SOLN
175.0000 ug | Freq: Every day | RESPIRATORY_TRACT | Status: DC
  Administered 2021-04-08 – 2021-04-10 (×3): 175 ug via RESPIRATORY_TRACT
  Filled 2021-04-06 (×5): qty 3

## 2021-04-06 MED ORDER — IPRATROPIUM-ALBUTEROL 0.5-2.5 (3) MG/3ML IN SOLN
3.0000 mL | Freq: Once | RESPIRATORY_TRACT | Status: AC
  Administered 2021-04-06: 3 mL via RESPIRATORY_TRACT
  Filled 2021-04-06: qty 3

## 2021-04-06 MED ORDER — FLUOXETINE HCL 20 MG PO CAPS
40.0000 mg | ORAL_CAPSULE | Freq: Every day | ORAL | Status: DC
  Administered 2021-04-07 – 2021-04-10 (×4): 40 mg via ORAL
  Filled 2021-04-06 (×4): qty 2

## 2021-04-06 MED ORDER — DILTIAZEM HCL-DEXTROSE 125-5 MG/125ML-% IV SOLN (PREMIX)
5.0000 mg/h | INTRAVENOUS | Status: DC
  Administered 2021-04-06: 5 mg/h via INTRAVENOUS
  Filled 2021-04-06: qty 125

## 2021-04-06 MED ORDER — PRAMIPEXOLE DIHYDROCHLORIDE 0.25 MG PO TABS
0.7500 mg | ORAL_TABLET | Freq: Every day | ORAL | Status: DC
  Administered 2021-04-06 – 2021-04-09 (×4): 0.75 mg via ORAL
  Filled 2021-04-06 (×4): qty 3

## 2021-04-06 MED ORDER — APIXABAN 2.5 MG PO TABS
2.5000 mg | ORAL_TABLET | Freq: Two times a day (BID) | ORAL | Status: DC
  Administered 2021-04-06 – 2021-04-10 (×8): 2.5 mg via ORAL
  Filled 2021-04-06 (×8): qty 1

## 2021-04-06 MED ORDER — METOPROLOL TARTRATE 50 MG PO TABS
50.0000 mg | ORAL_TABLET | Freq: Two times a day (BID) | ORAL | Status: DC
  Administered 2021-04-06 – 2021-04-10 (×9): 50 mg via ORAL
  Filled 2021-04-06 (×2): qty 1
  Filled 2021-04-06 (×4): qty 2
  Filled 2021-04-06 (×3): qty 1

## 2021-04-06 MED ORDER — SODIUM CHLORIDE 0.9 % IV SOLN: INTRAVENOUS | Status: DC

## 2021-04-06 MED ORDER — ONDANSETRON HCL 4 MG PO TABS: 4.0000 mg | ORAL_TABLET | Freq: Four times a day (QID) | ORAL | Status: DC | PRN

## 2021-04-06 MED ORDER — ACETAMINOPHEN 650 MG RE SUPP: 650.0000 mg | Freq: Four times a day (QID) | RECTAL | Status: DC | PRN

## 2021-04-06 MED ORDER — SODIUM CHLORIDE 0.9 % IV SOLN: INTRAVENOUS | Status: DC | PRN

## 2021-04-06 MED ORDER — SODIUM CHLORIDE 0.9 % IV BOLUS
1000.0000 mL | Freq: Once | INTRAVENOUS | Status: AC
  Administered 2021-04-06: 1000 mL via INTRAVENOUS

## 2021-04-06 MED ORDER — ACETAMINOPHEN 500 MG PO TABS
1000.0000 mg | ORAL_TABLET | Freq: Once | ORAL | Status: AC
  Administered 2021-04-06: 1000 mg via ORAL
  Filled 2021-04-06: qty 2

## 2021-04-06 MED ORDER — INSULIN ASPART 100 UNIT/ML IJ SOLN
0.0000 [IU] | Freq: Three times a day (TID) | INTRAMUSCULAR | Status: DC
  Administered 2021-04-07: 1 [IU] via SUBCUTANEOUS

## 2021-04-06 MED ORDER — PRAMIPEXOLE DIHYDROCHLORIDE 0.25 MG PO TABS
0.2500 mg | ORAL_TABLET | Freq: Every day | ORAL | Status: DC
  Administered 2021-04-07 – 2021-04-09 (×3): 0.25 mg via ORAL
  Filled 2021-04-06 (×4): qty 1

## 2021-04-06 MED ORDER — ACETAMINOPHEN 325 MG PO TABS: 650.0000 mg | ORAL_TABLET | Freq: Four times a day (QID) | ORAL | Status: DC | PRN

## 2021-04-06 MED ORDER — SODIUM CHLORIDE 0.9 % IV SOLN
500.0000 mg | Freq: Once | INTRAVENOUS | Status: AC
  Administered 2021-04-06: 500 mg via INTRAVENOUS
  Filled 2021-04-06: qty 500

## 2021-04-06 MED ORDER — LEVOTHYROXINE SODIUM 50 MCG PO TABS
50.0000 ug | ORAL_TABLET | Freq: Every day | ORAL | Status: DC
  Administered 2021-04-07 – 2021-04-10 (×4): 50 ug via ORAL
  Filled 2021-04-06 (×4): qty 1

## 2021-04-06 MED ORDER — PREGABALIN 50 MG PO CAPS
100.0000 mg | ORAL_CAPSULE | Freq: Every day | ORAL | Status: DC
  Administered 2021-04-06 – 2021-04-09 (×4): 100 mg via ORAL
  Filled 2021-04-06 (×4): qty 2

## 2021-04-06 MED ORDER — CHLORHEXIDINE GLUCONATE CLOTH 2 % EX PADS
6.0000 | MEDICATED_PAD | Freq: Every day | CUTANEOUS | Status: DC
  Administered 2021-04-06 – 2021-04-07 (×2): 6 via TOPICAL

## 2021-04-06 MED ORDER — SODIUM CHLORIDE 0.9 % IV SOLN
2.0000 g | Freq: Once | INTRAVENOUS | Status: AC
  Administered 2021-04-06: 2 g via INTRAVENOUS
  Filled 2021-04-06: qty 20

## 2021-04-06 MED ORDER — METHYLPREDNISOLONE SODIUM SUCC 125 MG IJ SOLR
125.0000 mg | Freq: Once | INTRAMUSCULAR | Status: AC
  Administered 2021-04-06: 125 mg via INTRAVENOUS
  Filled 2021-04-06: qty 2

## 2021-04-06 MED ORDER — ATORVASTATIN CALCIUM 40 MG PO TABS
40.0000 mg | ORAL_TABLET | Freq: Every day | ORAL | Status: DC
  Administered 2021-04-06 – 2021-04-10 (×5): 40 mg via ORAL
  Filled 2021-04-06 (×5): qty 1

## 2021-04-06 MED ORDER — DILTIAZEM HCL 25 MG/5ML IV SOLN
20.0000 mg | Freq: Once | INTRAVENOUS | Status: AC
  Administered 2021-04-06: 20 mg via INTRAVENOUS
  Filled 2021-04-06: qty 5

## 2021-04-06 NOTE — Assessment & Plan Note (Signed)
--   Converted per RN earlier, now in atrial fibrillation with rate controlled -- Continue beta-blocker, apixaban -- Blood pressure now within normal limits -- Follow clinically.

## 2021-04-06 NOTE — Assessment & Plan Note (Signed)
--  appears to be at baseline ?

## 2021-04-06 NOTE — Assessment & Plan Note (Signed)
--  secondary to PAF. Continue apixaban

## 2021-04-06 NOTE — Progress Notes (Signed)
Notified by EDP of need for admission d/t PNA and afib w/ RVR. [facility] accepts patient to SDU bed at [facility]. EDP is to remain responsible for orders/medical decisions while patient is holding at [facility]. Upon arrival to [facility], [facility] will assume care. Nursing staff will call flow manager/carelink to notify them of patient's arrival so that the proper [facility] member may receive the patient. Thank you.

## 2021-04-06 NOTE — ED Triage Notes (Signed)
Pt c/o SOB & cough the past 3 days. Hx asthma, been taking treatments without relief. States hx CHF

## 2021-04-06 NOTE — Assessment & Plan Note (Signed)
continue levothyroxine

## 2021-04-06 NOTE — Assessment & Plan Note (Addendum)
--  possible RLL --empiric abx and follow --sputum culture

## 2021-04-06 NOTE — Assessment & Plan Note (Signed)
--  bronchodilators. Appears stable.

## 2021-04-06 NOTE — H&P (Signed)
History and Physical  Melissa [name] JPE:[id number] DOB: 1928/02/25 DOA: 04/06/2021  PCP: Darreld Mclean, MD   Chief Complaint: cough  HPI:  87 year old woman PMH including atrial fibrillation, asthma, remote smoker, presented with severe coughing and multiple episodes of emesis.  Found to have atrial fibrillation with rapid ventricular response and suspicion for right lower lobe pneumonia and referred for admission.  Patient has a chronic cough which is daily but over the last week it has become quite severe and resulted in numerous episodes of vomiting and inability to keep food down.  She also endorses some vomiting anytime she tries to eat.  Some shortness of breath.  No diarrhea or abdominal pain.  She is compliant with her medications which are fixed by her granddaughter.  She lives with her daughter.  Chart review: Followed by cardiology for paroxysmal atrial fibrillation, nonischemic cardiomyopathy, chronic systolic CHF Followed by neurology for restless leg syndrome and diabetic peripheral neuropathy, treated with Mirapex, Keppra 250 mg at bedtime, Lyrica Followed by pulmonology for bronchiectasis, obstructive lung disease  ED Course: Treated with Tylenol, bronchodilators, diltiazem, steroid  Review of Systems:  Negative for fever, positive for macular degeneration, negative for sore throat, rash, new muscle aches, dysuria, bleeding, diarrhea, abdominal pain  Positive for some shortness of breath, chest pain from coughing, vomiting  PMH Asthma, bronchiectasis Chronic systolic CHF, nonischemic cardiomyopathy CKD stage III Diabetes mellitus type 2 with peripheral neuropathy Paroxysmal atrial fibrillation Restless leg syndrome Remainder reviewed in Epic  [place] Hysterectomy Cholecystectomy Remainder reviewed in Epic  Family history includes: Father with cancer, heart disease, mother with stroke Remainder reviewed in Epic  Social History Former smoker No  alcohol  Allergies None  Medications Current Outpatient Medications  Medication Instructions   acetaminophen (TYLENOL) 1,000 mg, Oral, Every 6 hours PRN, Reported on 11/02/2015   apixaban (ELIQUIS) 2.5 mg, Oral, 2 times daily   atorvastatin (LIPITOR) 40 MG tablet TAKE 1 TABLET DAILY   benzonatate (TESSALON) 200 MG capsule TAKE 1 CAPSULE BY MOUTH EVERY DAY 3 TIMES A DAY AS NEEDED FOR COUGH   blood glucose meter kit and supplies KIT Dispense based on patient and insurance preference. Use up to four times daily as directed.   Blood Glucose Monitoring Suppl DEVI Dispense one meter to use and monitor glucose as needed   cephALEXin (KEFLEX) 250 mg, Oral, Daily, For UTI prevention   cetirizine (ZYRTEC) 10 mg, Oral, Daily   Fe Fum-FePoly-Vit C-Vit B3 (INTEGRA) 62.5-62.5-40-3 MG CAPS 1 capsule, Oral, Daily   Fish Oil 1,000 mg, Oral, Daily   FLUoxetine (PROZAC) 40 mg, Oral, Daily   furosemide (LASIX) 40 mg, Oral, Daily   HUMALOG KWIKPEN 100 UNIT/ML KwikPen Inject 6 units under the skin daily with supper.    Levemir FlexTouch 25 Units, Subcutaneous, Daily   levETIRAcetam (KEPPRA) 250 mg, Oral, Daily at bedtime   levothyroxine (SYNTHROID) 50 mcg, Oral, Daily before breakfast   lidocaine (LIDODERM) 5 % 1 patch, Transdermal, Every 24 hours, Remove & Discard patch within 12 hours or as directed by MD   metoprolol succinate (TOPROL-XL) 50 mg, Oral, Daily, Take with or immediately following a meal.   Multiple Vitamins-Minerals (ICAPS AREDS 2) CAPS 1 capsule, Oral, 2 times daily   nystatin (MYCOSTATIN/NYSTOP) powder 1 application, Topical, 3 times daily, Apply under breasts or other skin folds as needed   ondansetron (ZOFRAN ODT) 4 MG disintegrating tablet 4mg  ODT q4 hours prn nausea/vomit   ondansetron (ZOFRAN) 4 mg, Oral, Every 8 hours PRN  ondansetron (ZOFRAN) 8 mg, Oral, Every 8 hours PRN   ONETOUCH DELICA LANCETS 47W MISC Use to check blood sugar 4 times per day dx code E11.29   ONETOUCH VERIO  test strip USE AS INSTRUCTED TO CHECK BLOOD SUGAR 4 TIMES PER DAY   OZEMPIC, 0.25 OR 0.5 MG/DOSE, 2 MG/1.5ML SOPN INJECT 0.5 MG UNDER THE SKIN EVERY SUNDAY   pantoprazole (PROTONIX) 40 mg, Oral, Daily after lunch   Perforomist 20 mcg, Nebulization, 2 times daily   potassium chloride SA (KLOR-CON M20) 20 MEQ tablet 20 mEq, Oral, Daily   pramipexole (MIRAPEX) 0.25 MG tablet Take 1 tablet at 5 pm   pramipexole (MIRAPEX) 0.75 MG tablet TAKE 1 TABLET AT BEDTIME   pregabalin (LYRICA) 50-100 mg, Oral, Daily at bedtime   Respiratory Therapy Supplies (FLUTTER) DEVI 1 Device, Does not apply, As needed   traZODone (DESYREL) 100 mg, Oral, Daily at bedtime   Vitamin D3 1,000 Units, Oral, Daily, 25 mcg   Yupelri 175 mcg, Nebulization, Daily    Physicial Exam   Vitals:  Vitals:   04/06/21 1730 04/06/21 1800  BP: 93/76 (!) 109/57  Pulse: 98 75  Resp: 18 20  Temp:    SpO2: 94% 91%    Constitutional:   Appears calm and comfortable Eyes:  pupils and irises appear normal Normal lids and conjunctivae ENMT:  grossly normal hearing  Lips appear normal Neck:  neck appears normal, no masses, normal ROM, supple no thyromegaly Respiratory:  CTA bilaterally, no w/r/r.  Respiratory effort normal. Cardiovascular:  irregular, no m/r/g No LE extremity edema   Abdomen:  Abdomen appears normal; no tenderness or masses No hernias Musculoskeletal:  RUE, LUE, RLE, LLE   strength and tone grossly normal Skin:  No rashes, lesions, ulcers palpation of skin: no induration or nodules Psychiatric:  Mental status Mood, affect appropriate judgment and insight appear intact   I have personally reviewed following labs and imaging studies  Labs:  Creatinine stable at 1.35 BNP without significant elevation, troponin within normal limits Lactic acid and CBC unremarkable  Imaging studies:  Chest x-ray per radiology no acute disease, per my read I do not see any definite changes but there is perhaps  infiltrate at the right base.  Medical tests:  EKG independently reviewed: Atrial fibrillation with rapid ventricular response, no acute changes   ASSESSMENT/PLAN  * Atrial fibrillation with rapid ventricular response (HCC) PMH PAF -- Converted per RN earlier, now in atrial fibrillation with rate controlled -- Continue beta-blocker, apixaban -- Blood pressure now within normal limits -- Follow clinically.  Obstructive lung disease (HCC) --bronchodilators. Appears stable.  Bronchiectasis without complication ([place]) -- With acute on chronic cough.  Continue bronchodilators.  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 Continue apixaban  Chronic systolic heart failure ([place]) --appears euvolemic --follow, continue Toprol and Lasix  CKD stage IIIb (HCC) --appears to be at baseline  Pneumonia --possible RLL --empiric abx and follow --sputum culture  Hypothyroidism, acquired --continue levothyroxine  Type 2 diabetes mellitus with diabetic neuropathy, with long-term current use of insulin (HCC) --stable. Continue long-acting insulin. Add SSI  DVT prophylaxis: apixaban Code Status: Full confirmed with patient Family Communication: daughter at bedside Consults called: none    Time spent: 52 minutes  Murray Hodgkins, MD  [facility] Direct contact: see [email or web address]  7PM-7AM contact night coverage as below   Check the care team in [facility] and look for a) attending/consulting [place] provider listed and b) the [facility] team listed Log into [email or web address] and use  [place]'s universal password to access. If you do not have the password, please contact the hospital operator. Locate the [facility] provider you are looking for under [facility] and page to a number that you can be directly reached. If you still have difficulty reaching the provider, please page the [facility] (Director on Call) for the Hospitalists listed on amion for assistance.  Severity of Illness: The appropriate  patient status for this patient is OBSERVATION. Observation status is judged to be reasonable and necessary in order to provide the required intensity of service to ensure the patient's safety. The patient's presenting symptoms, physical exam findings, and initial radiographic and laboratory data in the context of their medical condition is felt to place them at decreased risk for further clinical deterioration. Furthermore, it is anticipated that the patient will be medically stable for discharge from the hospital within 2 midnights of admission. The following factors support the patient status of observation.   " The patient's presenting symptoms include cough, SOB, vomiting. " The physical exam findings include rapid HR. " The initial radiographic and laboratory data are afib RVR, possible pneumonia.  Status is: Observation  The patient remains OBS appropriate and will d/c before 2 midnights.  Dispo: The patient is from: Home              Anticipated d/c is to: Home              Patient currently is not medically stable to d/c.   Difficult to place patient No  04/06/2021, 7:01 PM   Principal Problem:   Atrial fibrillation with rapid ventricular response (HCC) PMH PAF Active Problems:   Bronchiectasis without complication (HCC)   Obstructive lung disease (HCC)   CKD stage IIIb (HCC)   Chronic anticoagulation   Chronic systolic heart failure (HCC)   Acquired thrombophilia ([place])   Type 2 diabetes mellitus with diabetic neuropathy, with long-term current use of insulin (HCC)   Hypothyroidism, acquired   NICM (nonischemic cardiomyopathy) ([place])   Pneumonia
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
## 2021-04-06 NOTE — ED Notes (Addendum)
Assessed PT with complaint of SOB on RA- Sp02 95%, RR 19, BBS Clear- slightly diminished in bilateral bases. PT was mildly SOB as she ambulated from [facility] to bed (mild expiratory wheeze in throat when she originally sat down in bed). PT states she does have asthma and takes PRN Albuterol nebs at home (took tx at home with little resolve).

## 2021-04-06 NOTE — Assessment & Plan Note (Signed)
--   With acute on chronic cough.  Continue bronchodilators.

## 2021-04-06 NOTE — Assessment & Plan Note (Signed)
--  stable. Continue long-acting insulin. Add SSI

## 2021-04-06 NOTE — Assessment & Plan Note (Signed)
--  appears euvolemic --follow, continue Toprol and Lasix

## 2021-04-07 DIAGNOSIS — E039 Hypothyroidism, unspecified: Secondary | ICD-10-CM | POA: Diagnosis present

## 2021-04-07 DIAGNOSIS — I4891 Unspecified atrial fibrillation: Secondary | ICD-10-CM | POA: Diagnosis not present

## 2021-04-07 DIAGNOSIS — G2581 Restless legs syndrome: Secondary | ICD-10-CM | POA: Diagnosis present

## 2021-04-07 DIAGNOSIS — R54 Age-related physical debility: Secondary | ICD-10-CM | POA: Diagnosis present

## 2021-04-07 DIAGNOSIS — R1310 Dysphagia, unspecified: Secondary | ICD-10-CM | POA: Diagnosis not present

## 2021-04-07 DIAGNOSIS — Z20822 Contact with and (suspected) exposure to covid-19: Secondary | ICD-10-CM | POA: Diagnosis present

## 2021-04-07 DIAGNOSIS — J4541 Moderate persistent asthma with (acute) exacerbation: Secondary | ICD-10-CM | POA: Diagnosis present

## 2021-04-07 DIAGNOSIS — R112 Nausea with vomiting, unspecified: Secondary | ICD-10-CM | POA: Diagnosis not present

## 2021-04-07 DIAGNOSIS — D649 Anemia, unspecified: Secondary | ICD-10-CM | POA: Diagnosis present

## 2021-04-07 DIAGNOSIS — E1122 Type 2 diabetes mellitus with diabetic chronic kidney disease: Secondary | ICD-10-CM | POA: Diagnosis present

## 2021-04-07 DIAGNOSIS — I428 Other cardiomyopathies: Secondary | ICD-10-CM | POA: Diagnosis present

## 2021-04-07 DIAGNOSIS — N1832 Chronic kidney disease, stage 3b: Secondary | ICD-10-CM | POA: Diagnosis present

## 2021-04-07 DIAGNOSIS — K2970 Gastritis, unspecified, without bleeding: Secondary | ICD-10-CM | POA: Diagnosis present

## 2021-04-07 DIAGNOSIS — Z7901 Long term (current) use of anticoagulants: Secondary | ICD-10-CM | POA: Diagnosis not present

## 2021-04-07 DIAGNOSIS — E1142 Type 2 diabetes mellitus with diabetic polyneuropathy: Secondary | ICD-10-CM | POA: Diagnosis present

## 2021-04-07 DIAGNOSIS — Z79890 Hormone replacement therapy (postmenopausal): Secondary | ICD-10-CM | POA: Diagnosis not present

## 2021-04-07 DIAGNOSIS — D6859 Other primary thrombophilia: Secondary | ICD-10-CM | POA: Diagnosis present

## 2021-04-07 DIAGNOSIS — I130 Hypertensive heart and chronic kidney disease with heart failure and stage 1 through stage 4 chronic kidney disease, or unspecified chronic kidney disease: Secondary | ICD-10-CM | POA: Diagnosis present

## 2021-04-07 DIAGNOSIS — Z79899 Other long term (current) drug therapy: Secondary | ICD-10-CM | POA: Diagnosis not present

## 2021-04-07 DIAGNOSIS — I480 Paroxysmal atrial fibrillation: Secondary | ICD-10-CM | POA: Diagnosis present

## 2021-04-07 DIAGNOSIS — Z794 Long term (current) use of insulin: Secondary | ICD-10-CM | POA: Diagnosis not present

## 2021-04-07 DIAGNOSIS — J189 Pneumonia, unspecified organism: Secondary | ICD-10-CM | POA: Diagnosis present

## 2021-04-07 DIAGNOSIS — E860 Dehydration: Secondary | ICD-10-CM | POA: Diagnosis present

## 2021-04-07 DIAGNOSIS — I5022 Chronic systolic (congestive) heart failure: Secondary | ICD-10-CM | POA: Diagnosis present

## 2021-04-07 DIAGNOSIS — Z6828 Body mass index (BMI) 28.0-28.9, adult: Secondary | ICD-10-CM | POA: Diagnosis not present

## 2021-04-07 DIAGNOSIS — J470 Bronchiectasis with acute lower respiratory infection: Secondary | ICD-10-CM | POA: Diagnosis present

## 2021-04-07 LAB — GLUCOSE, CAPILLARY
Glucose-Capillary: 198 mg/dL — ABNORMAL HIGH (ref 70–99)
Glucose-Capillary: 136 mg/dL — ABNORMAL HIGH (ref 70–99)
Glucose-Capillary: 132 mg/dL — ABNORMAL HIGH (ref 70–99)
Glucose-Capillary: 136 mg/dL — ABNORMAL HIGH (ref 70–99)

## 2021-04-07 LAB — CBC
HCT: 38.2 % (ref 36.0–46.0)
Hemoglobin: 11.7 g/dL — ABNORMAL LOW (ref 12.0–15.0)
MCH: 27.8 pg (ref 26.0–34.0)
MCHC: 30.6 g/dL (ref 30.0–36.0)
MCV: 90.7 fL (ref 80.0–100.0)
Platelets: 157 10*3/uL (ref 150–400)
RBC: 4.21 MIL/uL (ref 3.87–5.11)
RDW: 13.8 % (ref 11.5–15.5)
WBC: 6.9 10*3/uL (ref 4.0–10.5)
nRBC: 0 % (ref 0.0–0.2)

## 2021-04-07 LAB — BASIC METABOLIC PANEL
Anion gap: 8 (ref 5–15)
BUN: 26 mg/dL — ABNORMAL HIGH (ref 8–23)
CO2: 26 mmol/L (ref 22–32)
Calcium: 8.7 mg/dL — ABNORMAL LOW (ref 8.9–10.3)
Chloride: 105 mmol/L (ref 98–111)
Creatinine, Ser: 1.45 mg/dL — ABNORMAL HIGH (ref 0.44–1.00)
GFR, Estimated: 34 mL/min — ABNORMAL LOW (ref >60)
Glucose, Bld: 206 mg/dL — ABNORMAL HIGH (ref 70–99)
Potassium: 4.3 mmol/L (ref 3.5–5.1)
Sodium: 139 mmol/L (ref 135–145)

## 2021-04-07 LAB — STREP PNEUMONIAE URINARY ANTIGEN: Strep Pneumo Urinary Antigen: NEGATIVE

## 2021-04-07 MED ORDER — GUAIFENESIN ER 600 MG PO TB12
600.0000 mg | ORAL_TABLET | Freq: Two times a day (BID) | ORAL | Status: DC
  Administered 2021-04-07 – 2021-04-10 (×7): 600 mg via ORAL
  Filled 2021-04-07 (×7): qty 1

## 2021-04-07 MED ORDER — PANTOPRAZOLE SODIUM 40 MG IV SOLR
40.0000 mg | Freq: Two times a day (BID) | INTRAVENOUS | Status: DC
  Administered 2021-04-07 – 2021-04-10 (×6): 40 mg via INTRAVENOUS
  Filled 2021-04-07 (×6): qty 40

## 2021-04-07 MED ORDER — POLYETHYLENE GLYCOL 3350 17 G PO PACK
17.0000 g | PACK | Freq: Every day | ORAL | Status: DC
  Administered 2021-04-09: 17 g via ORAL
  Filled 2021-04-07 (×2): qty 1

## 2021-04-07 NOTE — Progress Notes (Signed)
PROGRESS NOTE    Melissa [name]  ZOX:[id number] DOB: 13-Aug-1928 DOA: 04/06/2021 PCP: Darreld Mclean, MD    Chief Complaint  Patient presents with   Shortness of Breath    Brief Narrative:  87 year old woman PMH including atrial fibrillation, asthma, remote smoker, presented with severe coughing and multiple episodes of emesis.  Found to have atrial fibrillation with rapid ventricular response and suspicion for right lower lobe pneumonia and referred for admission.  Subjective:  She Continues to feel sob, with intermittent dry cough, granddaughter reports patient is coughing more today compare to several days ago She has no active vomiting, but she is afraid to eat because she vomits after eating a meal, also reports lower esophageal pain /epigastric pain when eating She feels weaker than normal No diarrhea She remains in afib, heart rate in the low 100's, blood pressure stable There is no edema  Assessment & Plan:   Principal Problem:   Atrial fibrillation with rapid ventricular response (HCC) PMH PAF Active Problems:   Type 2 diabetes mellitus with diabetic neuropathy, with long-term current use of insulin (HCC)   Hypothyroidism, acquired   CKD stage IIIb (HCC)   Chronic anticoagulation   Chronic systolic heart failure (HCC)   Dyspnea   NICM (nonischemic cardiomyopathy) ([place])   Pneumonia   Bronchiectasis without complication ([place])   Obstructive lung disease ([place])   Acquired thrombophilia ([place])   Afib/rvr/acquired thrombophilia  -improved, continue lopressor, apixaban   RLL Pneumonia/bronchoectasis/cough/sob/tachypnea, she is put on oxygen in the ED, I do not see any documentation of hypoxia RLL lung sounds coarse/rhonchi, cxr "Chronic bronchitic changes" She does has nonproductive cough, tachypnea, sob, no fever, no leukocytosis She is started on rocephin/zithro Will check urine strep pneumo antigen, procalcitonin, follow up on blood culture obtained from  yesterday Add mucinex/ flutter valve, continue nebs She also reported n/v for the last two weeks, aspiration pneumonia also on differential   N/v/fear of eating, Pain upon swallowing at lower esophageal/epigastric region She does has small hiatal hernia on recent Ct ab, no prior EGD in the system Likely will need dg esophagus study , but can not do on the weekend Continue PPI Granddaughter reported patient has intermittent nausea and vomiting for several years, progressive got worse in the last 55-month, she is followed by [facility] Dr. Watt Climes, [facility] consulted   Chronic systolic chf Currently appear dry, due to poor oral intake for the last two weeks Lasix held since admission, she is started on gentle hydration at 50cc/hr since admission Close monitor volume status, resume lasix when appropriate  Insulin dependent DM2 A1c 8.2 in 02/2021 Am fasting blood glucose 206 Change diet to carb modified diet  Continue current insulin regiment, levem[place] 25units/ssi  CKDIIIb Cr at baseline, monitor ,renal dosing meds  Hypothyroidism: Continue synthroid  FTT: reports feeling weaker, will get PT eval   The patient's BMI is: Body mass index is 28.34 kg/m.Marland Kitchen      Unresulted Labs (From admission, onward)     Start     Ordered   04/08/21 0500  Procalcitonin - Baseline  Tomorrow morning,   STAT       Question:  Specimen collection method  Answer:  Lab=Lab collect   04/07/21 0817   04/08/21 0500  CBC with Differential/Platelet  Tomorrow morning,   R       Question:  Specimen collection method  Answer:  Lab=Lab collect   04/07/21 1428   04/08/21 8119  Basic metabolic panel  Tomorrow  morning,   R       Question:  Specimen collection method  Answer:  Lab=Lab collect   04/07/21 1428   04/08/21 0500  Magnesium  Tomorrow morning,   R       Question:  Specimen collection method  Answer:  Lab=Lab collect   04/07/21 1428   04/07/21 0817  Strep pneumoniae urinary antigen  Once,   R         04/07/21 0816   04/06/21 1859  Expectorated Sputum Assessment w Gram Stain, Rflx to Resp Cult  (COPD / Pneumonia / Cellulitis / Lower Extremity Wound)  Once,   R        04/06/21 1900              DVT prophylaxis: apixaban (ELIQUIS) tablet 2.5 mg Start: 04/06/21 2200 SCDs Start: 04/06/21 1859 apixaban (ELIQUIS) tablet 2.5 mg   Code Status:Full Family Communication: granddaughter over the phone Disposition:   Status is: inpatient  Dispo: The patient is from: home               Anticipated d/c is to: pending Pt eval,               Anticipated d/c date is: early next week              Patient currently   Consultants:  [facility]  Procedures:  none  Antimicrobials:    Anti-infectives (From admission, onward)    Start     Dose/Rate Route Frequency Ordered Stop   04/07/21 1000  cefTRIAXone (ROCEPHIN) 2 g in sodium chloride 0.9 % 100 mL IVPB        2 g 200 mL/hr over 30 Minutes Intravenous Every 24 hours 04/06/21 1901 04/12/21 0959   04/07/21 1000  azithromycin (ZITHROMAX) 500 mg in sodium chloride 0.9 % 250 mL IVPB        500 mg 250 mL/hr over 60 Minutes Intravenous Every 24 hours 04/06/21 1901 04/12/21 0959   04/06/21 1230  cefTRIAXone (ROCEPHIN) 2 g in sodium chloride 0.9 % 100 mL IVPB        2 g 200 mL/hr over 30 Minutes Intravenous  Once 04/06/21 1224 04/06/21 1308   04/06/21 1230  azithromycin (ZITHROMAX) 500 mg in sodium chloride 0.9 % 250 mL IVPB        500 mg 250 mL/hr over 60 Minutes Intravenous  Once 04/06/21 1224 04/06/21 1445          Objective: Vitals:   04/07/21 0810 04/07/21 1200 04/07/21 1214 04/07/21 1300  BP: (!) 116/59 116/75  (!) 118/56  Pulse: 96 79  (!) 34  Resp: (!) 24 (!) 24  19  Temp:   (!) 97.1 F (36.2 C)   TempSrc:   Axillary   SpO2: 100% 90%  98%  Weight:      Height:        Intake/Output Summary (Last 24 hours) at 04/07/2021 1428 Last data filed at 04/07/2021 1200 Gross per 24 hour  Intake 1189.98 ml  Output --  Net  1189.98 ml   Filed Weights   04/06/21 1055 04/06/21 1700  Weight: 71.7 kg 74.9 kg    Examination:  General exam: frail elderly , slightly confused with time , oriented to place and person Respiratory system: + rhonchi/crackles, more on right lower lung, mild occasional wheezing,  tachypnea rr 22-24 Cardiovascular system: S1 & S2 heard, IRRR. No pedal edema. Gastrointestinal system: Abdomen is nondistended, soft and nontender.  Normal  bowel sounds heard. Central nervous system: Alert and oriented to person and place, not to time, No focal neurological deficits. Extremities: generalized weakness, no focal deficit, no edema Skin: No rashes, lesions or ulcers Psychiatry: Judgement and insight appear normal. Mood & affect appropriate. Impaired memory     Data Reviewed: I have personally reviewed following labs and imaging studies  CBC: Recent Labs  Lab 04/06/21 1129 04/07/21 0303  WBC 7.2 6.9  NEUTROABS 5.5  --   HGB 12.3 11.7*  HCT 39.2 38.2  MCV 89.1 90.7  PLT 177 782    Basic Metabolic Panel: Recent Labs  Lab 04/06/21 1129 04/07/21 0303  NA 137 139  K 3.8 4.3  CL 103 105  CO2 27 26  GLUCOSE 226* 206*  BUN 21 26*  CREATININE 1.35* 1.45*  CALCIUM 9.1 8.7*    GFR: Estimated Creatinine Clearance: 25.1 mL/min (A) (by C-G formula based on SCr of 1.45 mg/dL (H)).  Liver Function Tests: No results for input(s): AST, ALT, ALKPHOS, BILITOT, PROT, ALBUMIN in the last 168 hours.  CBG: Recent Labs  Lab 04/06/21 2100 04/07/21 0743 04/07/21 1159  GLUCAP 334* 198* 136*     Recent Results (from the past 240 hour(s))  Resp Panel by RT-PCR (Flu A&B, Covid) Nasopharyngeal Swab     Status: None   Collection Time: 04/06/21 11:29 AM   Specimen: Nasopharyngeal Swab; Nasopharyngeal(NP) swabs in vial transport medium  Result Value Ref Range Status   SARS Coronavirus 2 by RT PCR NEGATIVE NEGATIVE Final    Comment: (NOTE) SARS-CoV-2 target nucleic acids are NOT  DETECTED.  The SARS-CoV-2 RNA is generally detectable in upper respiratory specimens during the acute phase of infection. The lowest concentration of SARS-CoV-2 viral copies this assay can detect is 138 copies/mL. A negative result does not preclude SARS-Cov-2 infection and should not be used as the sole basis for treatment or other patient management decisions. A negative result may occur with  improper specimen collection/handling, submission of specimen other than nasopharyngeal swab, presence of viral mutation(s) within the areas targeted by this assay, and inadequate number of viral copies(<138 copies/mL). A negative result must be combined with clinical observations, patient history, and epidemiological information. The expected result is Negative.  Fact Sheet for Patients:  EntrepreneurPulse.com.au  Fact Sheet for Healthcare Providers:  IncredibleEmployment.be  This test is no t yet approved or cleared by the [place] FDA and  has been authorized for detection and/or diagnosis of SARS-CoV-2 by FDA under an Emergency Use Authorization (EUA). This EUA will remain  in effect (meaning this test can be used) for the duration of the COVID-19 declaration under Section 564(b)(1) of the Act, 21 U.S.C.section 360bbb-3(b)(1), unless the authorization is terminated  or revoked sooner.       Influenza A by PCR NEGATIVE NEGATIVE Final   Influenza B by PCR NEGATIVE NEGATIVE Final    Comment: (NOTE) The Xpert Xpress SARS-CoV-2/FLU/RSV plus assay is intended as an aid in the diagnosis of influenza from Nasopharyngeal swab specimens and should not be used as a sole basis for treatment. Nasal washings and aspirates are unacceptable for Xpert Xpress SARS-CoV-2/FLU/RSV testing.  Fact Sheet for Patients: EntrepreneurPulse.com.au  Fact Sheet for Healthcare Providers: IncredibleEmployment.be  This test is not yet  approved or cleared by the [place] FDA and has been authorized for detection and/or diagnosis of SARS-CoV-2 by FDA under an Emergency Use Authorization (EUA). This EUA will remain in effect (meaning this test can be used) for the duration  of the COVID-19 declaration under Section 564(b)(1) of the Act, 21 U.S.C. section 360bbb-3(b)(1), unless the authorization is terminated or revoked.  Performed at [facility], [address] [place], [place] [address]   Blood culture (routine x 2)     Status: None (Preliminary result)   Collection Time: 04/06/21 11:39 AM   Specimen: BLOOD  Result Value Ref Range Status   Specimen Description   Final    BLOOD LEFT ANTECUBITAL Performed at [facility], [address] [place], [place] [address]    Special Requests   Final    NONE Performed at [facility], [address] [place], [place] [address]    Culture   Final    NO GROWTH < 24 HOURS Performed at [facility], [place] [address], [place], [place] [address]    Report Status PENDING  Incomplete  Blood culture (routine x 2)     Status: None (Preliminary result)   Collection Time: 04/06/21 11:54 AM   Specimen: BLOOD  Result Value Ref Range Status   Specimen Description   Final    BLOOD RIGHT WRIST Performed at [facility] [address] [place], [place] [address]    Special Requests   Final    BOTTLES DRAWN AEROBIC AND ANAEROBIC Blood Culture adequate volume Performed at [facility], [address] [place], [place] [address]    Culture   Final    NO GROWTH < 24 HOURS Performed at [facility], [place] [address], [place], [place] [address]    Report Status PENDING  Incomplete  MRSA PCR Screening     Status: None   Collection Time: 04/06/21  5:27 PM  Result Value Ref Range Status   MRSA by PCR NEGATIVE NEGATIVE Final    Comment:        The GeneXpert MRSA Assay (FDA approved for NASAL specimens only), is one  component of a comprehensive MRSA colonization surveillance program. It is not intended to diagnose MRSA infection nor to guide or monitor treatment for MRSA infections. Performed at [facility], [place] [address], [place], [place] [address]          Radiology Studies: DG Chest Port 1 View  Result Date: 04/06/2021 CLINICAL DATA:  Shortness of breath and cough for 3 days, history asthma, CHF EXAM: PORTABLE CHEST 1 VIEW COMPARISON:  Portable exam 1104 hours compared to 03/09/2021 FINDINGS: Upper normal heart size. Mediastinal contours and pulmonary vascularity normal. Atherosclerotic calcification aorta. Chronic bronchitic changes without pulmonary infiltrate, pleural effusion, or pneumothorax. Osseous demineralization with prior proximal RIGHT humeral ORIF. IMPRESSION: Chronic bronchitic changes. No acute abnormalities. Aortic Atherosclerosis (ICD10-I70.0). Electronically Signed   By: Lavonia Dana M.D.   On: 04/06/2021 11:43        Scheduled Meds:  apixaban  2.5 mg Oral BID   arformoterol  15 mcg Nebulization Q12H   atorvastatin  40 mg Oral Daily   Chlorhexidine Gluconate Cloth  6 each Topical Daily   FLUoxetine  40 mg Oral Daily   guaiFENesin  600 mg Oral BID   insulin aspart  0-5 Units Subcutaneous QHS   insulin aspart  0-6 Units Subcutaneous TID WC   insulin detemir  25 Units Subcutaneous Q2200   levothyroxine  50 mcg Oral QAC breakfast   metoprolol tartrate  50 mg Oral BID   pantoprazole  40 mg Oral QHS   pramipexole  0.25  mg Oral QAC supper   pramipexole  0.75 mg Oral QHS   pregabalin  100 mg Oral QHS   revefenacin  175 mcg Nebulization Daily   sodium chloride flush  3 mL Intravenous Q12H   traZODone  100 mg Oral QHS   Continuous Infusions:  sodium chloride Stopped (04/06/21 1558)   azithromycin Stopped (04/07/21 0905)   cefTRIAXone (ROCEPHIN)  IV Stopped (04/07/21 1019)     LOS: 0 days   Time spent: 36mins Greater than 50% of this time was  spent in counseling, explanation of diagnosis, planning of further management, and coordination of care.   Voice Recognition Viviann Spare dictation system was used to create this note, attempts have been made to correct errors. Please contact the author with questions and/or clarifications.   Florencia Reasons, MD PhD FACP [facility]  Available via Epic secure chat 7am-7pm for nonurgent issues Please page for urgent issues To page the attending provider between 7A-7P or the covering provider during after hours 7P-7A, please log into the web site [email or web address] and access using universal [place] password for that web site. If you do not have the password, please call the hospital operator.    04/07/2021, 2:28 PM

## 2021-04-07 NOTE — Progress Notes (Signed)
Patient transferred to room 1443 via chair by tech and nurse. Patient tele transferred with all belongings. Vital signs were stable upon transfer. All evening meds were given as prescribed. Will call granddaughter and let her know that patient was transferred
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
## 2021-04-07 NOTE — Evaluation (Signed)
Physical Therapy Evaluation Patient Details Name: Melissa [name] MRN: [id number] DOB: 1928/06/09 Today's Date: 04/07/2021   History of Present Illness  87 year old female with  PMH including atrial fibrillation, asthma, remote smoker, presented with severe coughing and multiple episodes of emesis.  Found to have atrial fibrillation with rapid ventricular response and suspicion for right lower lobe pneumonia and  admitted to SDU 04/06/21  Clinical Impression  Patient resting  in recliner, HR 84. Patient stood without support, ambulated x 30' using RW. HR max 88. At baseline, independent in self ADL's in apartment, patient lives with daughter, does not drive  due to macular degeneration.  Patient  should progress to return home with daughter.  Pt admitted with above diagnosis.  Pt currently with functional limitations due to the deficits listed below (see PT Problem List). Pt will benefit from skilled PT to increase their independence and safety with mobility to allow discharge to the venue listed below.       Follow Up Recommendations Home health PT    Equipment Recommendations  None recommended by PT    Recommendations for Other Services       Precautions / Restrictions Precautions Precaution Comments: monitor HR, low vision/deficits      Mobility  Bed Mobility               General bed mobility comments: in recliner    Transfers Overall transfer level: Needs assistance Equipment used: Rolling walker (2 wheeled) Transfers: Sit to/from Stand Sit to Stand: Supervision         General transfer comment: patient stood up withput AD  Ambulation/Gait Ambulation/Gait assistance: Min guard Gait Distance (Feet): 30 Feet Assistive device: Rolling walker (2 wheeled) Gait Pattern/deviations: Step-through pattern Gait velocity: decr   General Gait Details: able to avoid objects , gait steady with RW. HR 88  Stairs            Wheelchair Mobility    Modified Rankin  (Stroke Patients Only)       Balance Overall balance assessment: No apparent balance deficits (not formally assessed)                                           Pertinent Vitals/Pain Pain Assessment: No/denies pain    Home Living Family/patient expects to be discharged to:: Private residence Living Arrangements: Children Available Help at Discharge: Family;Available 24 hours/day Type of Home: Apartment Home Access: Level entry     Home Layout: One level Home Equipment: Walker - 4 wheels;Cane - single point;Bedside commode;Walker - 2 wheels;Shower seat      Prior Function Level of Independence: Independent with assistive device(s)         Comments: does not drive, independent, uses cane at times     Hand Dominance   Dominant Hand: Right    Extremity/Trunk Assessment   Upper Extremity Assessment Upper Extremity Assessment: Overall WFL for tasks assessed    Lower Extremity Assessment Lower Extremity Assessment: Overall WFL for tasks assessed    Cervical / Trunk Assessment Cervical / Trunk Assessment: Normal  Communication   Communication: No difficulties;HOH  Cognition Arousal/Alertness: Awake/alert Behavior During Therapy: WFL for tasks assessed/performed Overall Cognitive Status: Within Functional Limits for tasks assessed  General Comments      Exercises     Assessment/Plan    PT Assessment Patient needs continued PT services  PT Problem List Decreased strength;Decreased mobility;Decreased activity tolerance;Cardiopulmonary status limiting activity       PT Treatment Interventions DME instruction;Therapeutic activities;Gait training;Therapeutic exercise;Patient/family education;Functional mobility training    PT Goals (Current goals can be found in the Care Plan section)  Acute Rehab PT Goals Patient Stated Goal: to go home PT Goal Formulation: With patient Time For Goal  Achievement: 04/21/21 Potential to Achieve Goals: Good    Frequency Min 3X/week   Barriers to discharge        Co-evaluation               AM-PAC PT "6 Clicks" Mobility  Outcome Measure Help needed turning from your back to your side while in a flat bed without using bedrails?: A Little Help needed moving from lying on your back to sitting on the side of a flat bed without using bedrails?: A Little Help needed moving to and from a bed to a chair (including a wheelchair)?: A Little Help needed standing up from a chair using your arms (e.g., wheelchair or bedside chair)?: A Little Help needed to walk in hospital room?: A Little Help needed climbing 3-5 steps with a railing? : A Little 6 Click Score: 18    End of Session Equipment Utilized During Treatment: Gait belt Activity Tolerance: Patient tolerated treatment well Patient left: in chair;with call bell/phone within reach Nurse Communication: Mobility status PT Visit Diagnosis: Difficulty in walking, not elsewhere classified (R26.2)    Time: 0045-9977 PT Time Calculation (min) (ACUTE ONLY): 19 min   Charges:   PT Evaluation $PT Eval Low Complexity: [place] Pager [phone] Office [phone]   Claretha Cooper 04/07/2021, 2:49 PM

## 2021-04-08 LAB — CBC WITH DIFFERENTIAL/PLATELET
Abs Immature Granulocytes: 0.04 10*3/uL (ref 0.00–0.07)
Basophils Absolute: 0 10*3/uL (ref 0.0–0.1)
Basophils Relative: 0 %
Eosinophils Absolute: 0.1 10*3/uL (ref 0.0–0.5)
Eosinophils Relative: 1 %
HCT: 40.3 % (ref 36.0–46.0)
Hemoglobin: 12.2 g/dL (ref 12.0–15.0)
Immature Granulocytes: 0 %
Lymphocytes Relative: 13 %
Lymphs Abs: 1.3 10*3/uL (ref 0.7–4.0)
MCH: 28.2 pg (ref 26.0–34.0)
MCHC: 30.3 g/dL (ref 30.0–36.0)
MCV: 93.1 fL (ref 80.0–100.0)
Monocytes Absolute: 0.7 10*3/uL (ref 0.1–1.0)
Monocytes Relative: 7 %
Neutro Abs: 8.1 10*3/uL — ABNORMAL HIGH (ref 1.7–7.7)
Neutrophils Relative %: 79 %
Platelets: 155 10*3/uL (ref 150–400)
RBC: 4.33 MIL/uL (ref 3.87–5.11)
RDW: 14 % (ref 11.5–15.5)
WBC: 10.3 10*3/uL (ref 4.0–10.5)
nRBC: 0 % (ref 0.0–0.2)

## 2021-04-08 LAB — GLUCOSE, CAPILLARY
Glucose-Capillary: 98 mg/dL (ref 70–99)
Glucose-Capillary: 118 mg/dL — ABNORMAL HIGH (ref 70–99)
Glucose-Capillary: 99 mg/dL (ref 70–99)
Glucose-Capillary: 88 mg/dL (ref 70–99)

## 2021-04-08 LAB — BASIC METABOLIC PANEL
Anion gap: 7 (ref 5–15)
BUN: 33 mg/dL — ABNORMAL HIGH (ref 8–23)
CO2: 24 mmol/L (ref 22–32)
Calcium: 8.6 mg/dL — ABNORMAL LOW (ref 8.9–10.3)
Chloride: 107 mmol/L (ref 98–111)
Creatinine, Ser: 1.55 mg/dL — ABNORMAL HIGH (ref 0.44–1.00)
GFR, Estimated: 31 mL/min — ABNORMAL LOW (ref >60)
Glucose, Bld: 111 mg/dL — ABNORMAL HIGH (ref 70–99)
Potassium: 4.3 mmol/L (ref 3.5–5.1)
Sodium: 138 mmol/L (ref 135–145)

## 2021-04-08 LAB — MAGNESIUM: Magnesium: 1.6 mg/dL — ABNORMAL LOW (ref 1.7–2.4)

## 2021-04-08 LAB — PROCALCITONIN: Procalcitonin: <0.1 ng/mL

## 2021-04-08 MED ORDER — SUCRALFATE 1 GM/10ML PO SUSP
1.0000 g | Freq: Three times a day (TID) | ORAL | Status: DC
  Administered 2021-04-08 – 2021-04-10 (×8): 1 g via ORAL
  Filled 2021-04-08 (×8): qty 10

## 2021-04-08 MED ORDER — MAGNESIUM SULFATE IN D5W 1-5 GM/100ML-% IV SOLN
1.0000 g | Freq: Once | INTRAVENOUS | Status: AC
  Administered 2021-04-08: 1 g via INTRAVENOUS
  Filled 2021-04-08: qty 100

## 2021-04-08 MED ORDER — SUCRALFATE 1 GM/10ML PO SUSP: 1.0000 g | Freq: Three times a day (TID) | ORAL | Status: DC

## 2021-04-08 NOTE — Progress Notes (Addendum)
PROGRESS NOTE    Melissa [name]  UDJ:[id number] DOB: Nov 02, 1927 DOA: 04/06/2021 PCP: Darreld Mclean, MD    Chief Complaint  Patient presents with   Shortness of Breath    Brief Narrative:  87 year old woman PMH including atrial fibrillation, asthma, remote smoker, presented with severe coughing and multiple episodes of emesis.  Found to have atrial fibrillation with rapid ventricular response and suspicion for right lower lobe pneumonia and referred for admission.  Subjective:  She is sitting up in chair She continues to report poor oral intake, fear of eating due to pain and vomiting after oral intake She has not vomited since in the hospital She reports cough more after eating or if lay flat, she reports has been sleeping in a recliner because of cough laying flat  Daughter at bedside   Assessment & Plan:   Principal Problem:   Atrial fibrillation with rapid ventricular response ([place]) PMH PAF Active Problems:   Type 2 diabetes mellitus with diabetic neuropathy, with long-term current use of insulin (HCC)   Hypothyroidism, acquired   CKD stage IIIb (HCC)   Chronic anticoagulation   Chronic systolic heart failure (HCC)   Dyspnea   NICM (nonischemic cardiomyopathy) ([place])   Pneumonia   Bronchiectasis without complication ([place])   Obstructive lung disease ([place])   Acquired thrombophilia ([place])    RLL Pneumonia/bronchoectasis/cough/sob/tachypnea, she is put on oxygen in the ED, I do not see any documentation of hypoxia RLL lung sounds coarse/rhonchi, cxr "Chronic bronchitic changes" Ahd nonproductive cough, tachypnea, sob on presentation, no fever, no leukocytosis urine strep pneumo antigen negative ,,procalcitonin less  than 0.1, blood culture no growth so far Continue mucinex/ flutter valve, continue nebs She also reported n/v for the last two weeks, aspiration pneumonia also on differential She is started on rocephin/zithro, d/c zithro, continue rocephin for  now  N/v/fear of eating, Pain upon swallowing at lower esophageal/epigastric region She does has small hiatal hernia on recent Ct ab, no prior EGD in the system Continue PPI Granddaughter reported patient has intermittent nausea and vomiting for several years, progressive got worse in the last 36-month, she is followed by [facility] Dr. Watt Climes,  [facility] consulted Will follow recommendation   Afib/rvr/acquired thrombophilia  -afib/rvr on presentation , likely due to vomiting/coughing -improved, continue lopressor, apixaban   Chronic systolic chf Currently appear dry, due to poor oral intake for the last two weeks Lasix held since admission, she was on gentle hydration at 50cc/hr , now off hydration  Close monitor volume status, resume lasix when appropriate Patient has cardiology appointment on Wednesday, if not able to discharge on Tuesday ,family request cardiology see patient in the hospital.  Hypomagnesemia Replace mag  Insulin dependent DM2 A1c 8.2 in 02/2021 Am fasting blood glucose 206 Change diet to carb modified diet  Continue current insulin regiment, levemire 25units/ssi  CKDIIIb Cr at baseline, monitor ,renal dosing meds  Hypothyroidism: Continue synthroid  FTT: reports feeling weaker, PT eval, recommended home health   The patient's BMI is: Body mass index is 28.34 kg/m.Marland Kitchen      Unresulted Labs (From admission, onward)     Start     Ordered   04/09/21 5885  Basic metabolic panel  Tomorrow morning,   R       Question:  Specimen collection method  Answer:  Lab=Lab collect   04/08/21 1628   04/09/21 0500  Magnesium  Tomorrow morning,   R       Question:  Specimen collection method  Answer:  Lab=Lab collect   04/08/21 1628   04/06/21 1859  Expectorated Sputum Assessment w Gram Stain, Rflx to Resp Cult  (COPD / Pneumonia / Cellulitis / Lower Extremity Wound)  Once,   R        04/06/21 1900              DVT prophylaxis: apixaban (ELIQUIS) tablet 2.5 mg  Start: 04/06/21 2200 SCDs Start: 04/06/21 1859 apixaban (ELIQUIS) tablet 2.5 mg   Code Status:Full Family Communication: granddaughter over the phone on 6/25, daughter at bedside on 6/26 Disposition:   Status is: inpatient  Dispo: The patient is from: home               Anticipated d/c is to: Home with home health once cleared by GI              Anticipated d/c date is: Need GI clearance, if not able to discharge on Tuesday, family request cardiology see patient in the hospital                Consultants:  [facility]  Procedures:  none  Antimicrobials:    Anti-infectives (From admission, onward)    Start     Dose/Rate Route Frequency Ordered Stop   04/07/21 1000  cefTRIAXone (ROCEPHIN) 2 g in sodium chloride 0.9 % 100 mL IVPB        2 g 200 mL/hr over 30 Minutes Intravenous Every 24 hours 04/06/21 1901 04/12/21 0959   04/07/21 1000  azithromycin (ZITHROMAX) 500 mg in sodium chloride 0.9 % 250 mL IVPB  Status:  Discontinued        500 mg 250 mL/hr over 60 Minutes Intravenous Every 24 hours 04/06/21 1901 04/08/21 1623   04/06/21 1230  cefTRIAXone (ROCEPHIN) 2 g in sodium chloride 0.9 % 100 mL IVPB        2 g 200 mL/hr over 30 Minutes Intravenous  Once 04/06/21 1224 04/06/21 1308   04/06/21 1230  azithromycin (ZITHROMAX) 500 mg in sodium chloride 0.9 % 250 mL IVPB        500 mg 250 mL/hr over 60 Minutes Intravenous  Once 04/06/21 1224 04/06/21 1445          Objective: Vitals:   04/08/21 0410 04/08/21 0734 04/08/21 0801 04/08/21 1419  BP: 122/72   123/77  Pulse: 72   79  Resp: 18   14  Temp: 98 F (36.7 C)   (!) 97.5 F (36.4 C)  TempSrc: Oral   Oral  SpO2: 92% 98% 97% 95%  Weight:      Height:        Intake/Output Summary (Last 24 hours) at 04/08/2021 1628 Last data filed at 04/07/2021 1700 Gross per 24 hour  Intake 120 ml  Output --  Net 120 ml   Filed Weights   04/06/21 1055 04/06/21 1700  Weight: 71.7 kg 74.9 kg    Examination:  General exam:  frail elderly , slightly confused with time , oriented to place and person Respiratory system: Lung exam has improved , no wheezing today , right lower lobe rhonchi crackle much improved , tachypnea resolved  Cardiovascular system: S1 & S2 heard, IRRR. No pedal edema. Gastrointestinal system: Abdomen is nondistended, soft and nontender.  Normal bowel sounds heard. Central nervous system: Alert and oriented to person and place, not to time, No focal neurological deficits. Extremities: generalized weakness, no focal deficit, no edema Skin: No rashes, lesions or ulcers Psychiatry: Judgement and insight appear  normal. Mood & affect appropriate. Impaired memory     Data Reviewed: I have personally reviewed following labs and imaging studies  CBC: Recent Labs  Lab 04/06/21 1129 04/07/21 0303 04/08/21 0337  WBC 7.2 6.9 10.3  NEUTROABS 5.5  --  8.1*  HGB 12.3 11.7* 12.2  HCT 39.2 38.2 40.3  MCV 89.1 90.7 93.1  PLT 177 157 342    Basic Metabolic Panel: Recent Labs  Lab 04/06/21 1129 04/07/21 0303 04/08/21 0337  NA 137 139 138  K 3.8 4.3 4.3  CL 103 105 107  CO2 27 26 24   GLUCOSE 226* 206* 111*  BUN 21 26* 33*  CREATININE 1.35* 1.45* 1.55*  CALCIUM 9.1 8.7* 8.6*  MG  --   --  1.6*    GFR: Estimated Creatinine Clearance: 23.4 mL/min (A) (by C-G formula based on SCr of 1.55 mg/dL (H)).  Liver Function Tests: No results for input(s): AST, ALT, ALKPHOS, BILITOT, PROT, ALBUMIN in the last 168 hours.  CBG: Recent Labs  Lab 04/07/21 1159 04/07/21 1642 04/07/21 2122 04/08/21 0756 04/08/21 1131  GLUCAP 136* 132* 136* 98 118*     Recent Results (from the past 240 hour(s))  Resp Panel by RT-PCR (Flu A&B, Covid) Nasopharyngeal Swab     Status: None   Collection Time: 04/06/21 11:29 AM   Specimen: Nasopharyngeal Swab; Nasopharyngeal(NP) swabs in vial transport medium  Result Value Ref Range Status   SARS Coronavirus 2 by RT PCR NEGATIVE NEGATIVE Final    Comment:  (NOTE) SARS-CoV-2 target nucleic acids are NOT DETECTED.  The SARS-CoV-2 RNA is generally detectable in upper respiratory specimens during the acute phase of infection. The lowest concentration of SARS-CoV-2 viral copies this assay can detect is 138 copies/mL. A negative result does not preclude SARS-Cov-2 infection and should not be used as the sole basis for treatment or other patient management decisions. A negative result may occur with  improper specimen collection/handling, submission of specimen other than nasopharyngeal swab, presence of viral mutation(s) within the areas targeted by this assay, and inadequate number of viral copies(<138 copies/mL). A negative result must be combined with clinical observations, patient history, and epidemiological information. The expected result is Negative.  Fact Sheet for Patients:  EntrepreneurPulse.com.au  Fact Sheet for Healthcare Providers:  IncredibleEmployment.be  This test is no t yet approved or cleared by the [place] FDA and  has been authorized for detection and/or diagnosis of SARS-CoV-2 by FDA under an Emergency Use Authorization (EUA). This EUA will remain  in effect (meaning this test can be used) for the duration of the COVID-19 declaration under Section 564(b)(1) of the Act, 21 U.S.C.section 360bbb-3(b)(1), unless the authorization is terminated  or revoked sooner.       Influenza A by PCR NEGATIVE NEGATIVE Final   Influenza B by PCR NEGATIVE NEGATIVE Final    Comment: (NOTE) The Xpert Xpress SARS-CoV-2/FLU/RSV plus assay is intended as an aid in the diagnosis of influenza from Nasopharyngeal swab specimens and should not be used as a sole basis for treatment. Nasal washings and aspirates are unacceptable for Xpert Xpress SARS-CoV-2/FLU/RSV testing.  Fact Sheet for Patients: EntrepreneurPulse.com.au  Fact Sheet for Healthcare  Providers: IncredibleEmployment.be  This test is not yet approved or cleared by the [place] FDA and has been authorized for detection and/or diagnosis of SARS-CoV-2 by FDA under an Emergency Use Authorization (EUA). This EUA will remain in effect (meaning this test can be used) for the duration of the COVID-19 declaration under Section  564(b)(1) of the Act, 21 U.S.C. section 360bbb-3(b)(1), unless the authorization is terminated or revoked.  Performed at [facility], [address] [place], [place] [address]   Blood culture (routine x 2)     Status: None (Preliminary result)   Collection Time: 04/06/21 11:39 AM   Specimen: BLOOD  Result Value Ref Range Status   Specimen Description   Final    BLOOD LEFT ANTECUBITAL Performed at [facility], [address] [place], [place] [address]    Special Requests   Final    NONE Performed at [facility], [address] [place], [place] [address]    Culture   Final    NO GROWTH 2 DAYS Performed at [facility], [place] [address], [place], [place] [address]    Report Status PENDING  Incomplete  Blood culture (routine x 2)     Status: None (Preliminary result)   Collection Time: 04/06/21 11:54 AM   Specimen: BLOOD  Result Value Ref Range Status   Specimen Description   Final    BLOOD RIGHT WRIST Performed at [facility], [address] [place], [place] [address]    Special Requests   Final    BOTTLES DRAWN AEROBIC AND ANAEROBIC Blood Culture adequate volume Performed at [facility], [address] [place], [place] [address]    Culture   Final    NO GROWTH 2 DAYS Performed at [facility], [place] [address], [place], [place] [address]    Report Status PENDING  Incomplete  MRSA PCR Screening     Status: None   Collection Time: 04/06/21  5:27 PM  Result Value Ref Range Status   MRSA by PCR NEGATIVE NEGATIVE Final    Comment:        The  GeneXpert MRSA Assay (FDA approved for NASAL specimens only), is one component of a comprehensive MRSA colonization surveillance program. It is not intended to diagnose MRSA infection nor to guide or monitor treatment for MRSA infections. Performed at [facility], [place] [address], [place], [place] [address]          Radiology Studies: No results found.      Scheduled Meds:  apixaban  2.5 mg Oral BID   arformoterol  15 mcg Nebulization Q12H   atorvastatin  40 mg Oral Daily   FLUoxetine  40 mg Oral Daily   guaiFENesin  600 mg Oral BID   insulin aspart  0-5 Units Subcutaneous QHS   insulin aspart  0-6 Units Subcutaneous TID WC   insulin detemir  25 Units Subcutaneous Q2200   levothyroxine  50 mcg Oral QAC breakfast   metoprolol tartrate  50 mg Oral BID   pantoprazole (PROTONIX) IV  40 mg Intravenous Q12H   polyethylene glycol  17 g Oral Daily   pramipexole  0.25 mg Oral QAC supper   pramipexole  0.75 mg Oral QHS   pregabalin  100 mg Oral QHS   revefenacin  175 mcg Nebulization Daily   sodium chloride flush  3 mL Intravenous Q12H   traZODone  100 mg Oral QHS   Continuous Infusions:  sodium chloride Stopped (04/06/21 1558)   cefTRIAXone (ROCEPHIN)  IV 2 g (04/08/21 1032)     LOS: 1 day   Time spent: 5mins Greater than 50% of this time was spent in counseling, explanation of diagnosis, planning of further management, and coordination of care.  Voice Recognition Viviann Spare dictation system was used to create this note, attempts have been made to correct errors. Please contact the author with questions and/or clarifications.   Florencia Reasons, MD PhD FACP [facility]  Available via Epic secure chat 7am-7pm for nonurgent issues Please page for urgent issues To page the attending provider between 7A-7P or the covering provider during after hours 7P-7A, please log into the web site [email or web address] and access using universal [place] password for  that web site. If you do not have the password, please call the hospital operator.    04/08/2021, 4:28 PM

## 2021-04-08 NOTE — Progress Notes (Signed)
[facility] Gastroenterology Progress Note  Melissa [name] 87 y.o. 03-13-1928   Subjective: Sitting in bedside chair. Eating small amounts of solid food. Denies N/V yesterday or today. Complaining of burning in her throat with eating. No family in room this morning during my evaluation.  Objective: Vital signs: Vitals:   04/08/21 0734 04/08/21 0801  BP:    Pulse:    Resp:    Temp:    SpO2: 98% 97%  T 98, P 72, BP 122/72  Physical Exam: Gen: lethargic, elderly, well-nourished, no acute distress  HEENT: anicteric sclera CV: RRR Chest: CTA B Abd: soft, nontender, nondistended, +BS Ext: no edema  Lab Results: Recent Labs    04/07/21 0303 04/08/21 0337  NA 139 138  K 4.3 4.3  CL 105 107  CO2 26 24  GLUCOSE 206* 111*  BUN 26* 33*  CREATININE 1.45* 1.55*  CALCIUM 8.7* 8.6*  MG  --  1.6*   No results for input(s): AST, ALT, ALKPHOS, BILITOT, PROT, ALBUMIN in the last 72 hours. Recent Labs    04/06/21 1129 04/07/21 0303 04/08/21 0337  WBC 7.2 6.9 10.3  NEUTROABS 5.5  --  8.1*  HGB 12.3 11.7* 12.2  HCT 39.2 38.2 40.3  MCV 89.1 90.7 93.1  PLT 177 157 155      Assessment/Plan: History of chronic nausea/vomiting that is improving. GERD and will see if increased PPI BID helps. May need to start empiric IV Diflucan for possible Candida esophagitis tomorrow if the burning with eating continues to occur. Continue supportive care. Will follow.   [name] 04/08/2021, 9:07 AM  Questions please call [phone] Patient ID: Melissa [name], female   DOB: 1928/02/02, 87 y.o.   MRN: [id number]

## 2021-04-09 LAB — GLUCOSE, CAPILLARY
Glucose-Capillary: 47 mg/dL — ABNORMAL LOW (ref 70–99)
Glucose-Capillary: 77 mg/dL (ref 70–99)
Glucose-Capillary: 84 mg/dL (ref 70–99)
Glucose-Capillary: 117 mg/dL — ABNORMAL HIGH (ref 70–99)
Glucose-Capillary: 129 mg/dL — ABNORMAL HIGH (ref 70–99)

## 2021-04-09 LAB — BASIC METABOLIC PANEL
Anion gap: 7 (ref 5–15)
BUN: 27 mg/dL — ABNORMAL HIGH (ref 8–23)
CO2: 23 mmol/L (ref 22–32)
Calcium: 8.8 mg/dL — ABNORMAL LOW (ref 8.9–10.3)
Chloride: 106 mmol/L (ref 98–111)
Creatinine, Ser: 1.37 mg/dL — ABNORMAL HIGH (ref 0.44–1.00)
GFR, Estimated: 36 mL/min — ABNORMAL LOW (ref >60)
Glucose, Bld: 62 mg/dL — ABNORMAL LOW (ref 70–99)
Potassium: 3.7 mmol/L (ref 3.5–5.1)
Sodium: 136 mmol/L (ref 135–145)

## 2021-04-09 LAB — MAGNESIUM: Magnesium: 2 mg/dL (ref 1.7–2.4)

## 2021-04-09 NOTE — Progress Notes (Addendum)
PROGRESS NOTE    Melissa [name]  XTK:[id number] DOB: [birth year] DOA: 04/06/2021 PCP: Darreld Mclean, MD   Chief Complain: Shortness of breath  Brief Narrative: Patient is a 87 year old female with history of paroxysmal A. fib, asthma, previous tobacco use presented with severe cough, multiple episodes of vomiting.  Also found to have A. fib with RVR on presentation, also suspected to have right lower lobe pneumonia and was admitted for further management.  She was discharged on antibiotic treatment for right lower lobe pneumonia.  Hospital course was remarkable for persistent nausea/vomiting/odynophagia for which GI was consulted.  Assessment & Plan:   Principal Problem:   Atrial fibrillation with rapid ventricular response (HCC) PMH PAF Active Problems:   Type 2 diabetes mellitus with diabetic neuropathy, with long-term current use of insulin (HCC)   Hypothyroidism, acquired   CKD stage IIIb (HCC)   Chronic anticoagulation   Chronic systolic heart failure (HCC)   Dyspnea   NICM (nonischemic cardiomyopathy) ([place])   Pneumonia   Bronchiectasis without complication ([place])   Obstructive lung disease ([place])   Acquired thrombophilia ([place])  Right lower lobe pneumonia: Chest x-ray showed chronic bronchitic changes.  Presented with nonproductive cough, tachypnea, shortness of breath.  Afebrile, no leukocytosis on presentation.  Urine streptococcal pneumonia antigen negative.  Procalcitonin was unimpressive.  Blood cultures have not shown any growth.  Continue supportive care,  cough medications, nebulizer treatment as necessary.  She was empirically started on ceftriaxone, azithromycin for the treatment of community acquired pneumonia.  Continue antibiotics for 5 days course. Currently she is on room air.  Nausea/vomiting/odynophagia: Found to have small hiatal hernia and recent CT abdomen, no prior is in the system.  GI consulted for persistent nausea/vomiting.  Recommended Protonix and  Carafate with symptomatic improvement.  Currently on soft diet.  Paroxysmal A. fib: On RVR on presentation.  Currently rate is controlled with Lopressor.  On Eliquis for anticoagulation  Chronic systolic congestive heart failure: Appeared dehydrated on presentation due to poor oral intake.  Home Lasix on hold.  She was started on IV fluids, now stopped.  She has cardiology appointment on Wednesday.  If the patient is not discharged by then, family requested cardiology evaluation here.  Hypomagnesemia: Being monitored and supplemented.  Insulin-dependent diabetes type 2: Recent hemoglobin A1c of 8.2 as per 5/22.  She became hypoglycemic on the morning of 04/09/21, Lantus has been held.  Currently on sliding scale insulin.  Monitor blood sugars  CKD stage IIIb: Currently kidney function at baseline.  Hypothyroidism: Continue Synthyroid  Debility/deconditioning: Patient seen by PT and recommend home health on discharge.            DVT prophylaxis:Eliquis Code Status: Full Family Communication: Daughter at bedside on 04/09/21 Status is: Inpatient  Remains inpatient appropriate because:Unsafe d/c plan  Dispo: The patient is from: Home              Anticipated d/c is to: Home              Patient currently is not medically stable to d/c.   Difficult to place patient No     Consultants: GI  Procedures:None  Antimicrobials:  Anti-infectives (From admission, onward)    Start     Dose/Rate Route Frequency Ordered Stop   04/07/21 1000  cefTRIAXone (ROCEPHIN) 2 g in sodium chloride 0.9 % 100 mL IVPB        2 g 200 mL/hr over 30 Minutes Intravenous Every 24 hours 04/06/21 1901 04/12/21 0959  04/07/21 1000  azithromycin (ZITHROMAX) 500 mg in sodium chloride 0.9 % 250 mL IVPB  Status:  Discontinued        500 mg 250 mL/hr over 60 Minutes Intravenous Every 24 hours 04/06/21 1901 04/08/21 1623   04/06/21 1230  cefTRIAXone (ROCEPHIN) 2 g in sodium chloride 0.9 % 100 mL IVPB         2 g 200 mL/hr over 30 Minutes Intravenous  Once 04/06/21 1224 04/06/21 1308   04/06/21 1230  azithromycin (ZITHROMAX) 500 mg in sodium chloride 0.9 % 250 mL IVPB        500 mg 250 mL/hr over 60 Minutes Intravenous  Once 04/06/21 1224 04/06/21 1445       Subjective:  Patient seen and examined the bedside this morning.  Hemodynamically stable.  Sitting in the chair.  On room air.  Denies any vomiting today.  Had a bowel movement yesterday.  Daughter was at bedside.  Looks weak, but overall comfortable.  Continues to complain of cough.  Objective: Vitals:   04/08/21 2155 04/09/21 0640 04/09/21 0907 04/09/21 1150  BP: 123/68 116/71  111/84  Pulse: 89 (!) 54  90  Resp: 20 16  17   Temp: 97.7 F (36.5 C) 97.7 F (36.5 C)  97.9 F (36.6 C)  TempSrc: Oral Oral  Oral  SpO2: 100% 93% 94% 97%  Weight:      Height:        Intake/Output Summary (Last 24 hours) at 04/09/2021 1317 Last data filed at 04/09/2021 1207 Gross per 24 hour  Intake 120 ml  Output 400 ml  Net -280 ml   Filed Weights   04/06/21 1055 04/06/21 1700  Weight: 71.7 kg 74.9 kg    Examination:  General exam: Weak, deconditioned, pleasant elderly female HEENT:PERRL,Oral mucosa moist, Ear/Nose normal on gross exam Respiratory system: No wheezes or crackles,  cardiovascular system: Irregularly irregular rhythm. No JVD, murmurs, rubs, gallops or clicks. No pedal edema. Gastrointestinal system: Abdomen is nondistended, soft and nontender. No organomegaly or masses felt. Normal bowel sounds heard. Central nervous system: Alert and oriented. No focal neurological deficits. Extremities: No edema, no clubbing ,no cyanosis Skin: No rashes, lesions or ulcers,no icterus ,no pallor  Data Reviewed: I have personally reviewed following labs and imaging studies  CBC: Recent Labs  Lab 04/06/21 1129 04/07/21 0303 04/08/21 0337  WBC 7.2 6.9 10.3  NEUTROABS 5.5  --  8.1*  HGB 12.3 11.7* 12.2  HCT 39.2 38.2 40.3  MCV 89.1  90.7 93.1  PLT 177 157 353   Basic Metabolic Panel: Recent Labs  Lab 04/06/21 1129 04/07/21 0303 04/08/21 0337 04/09/21 0349  NA 137 139 138 136  K 3.8 4.3 4.3 3.7  CL 103 105 107 106  CO2 27 26 24 23   GLUCOSE 226* 206* 111* 62*  BUN 21 26* 33* 27*  CREATININE 1.35* 1.45* 1.55* 1.37*  CALCIUM 9.1 8.7* 8.6* 8.8*  MG  --   --  1.6* 2.0   GFR: Estimated Creatinine Clearance: 26.5 mL/min (A) (by C-G formula based on SCr of 1.37 mg/dL (H)). Liver Function Tests: No results for input(s): AST, ALT, ALKPHOS, BILITOT, PROT, ALBUMIN in the last 168 hours. No results for input(s): LIPASE, AMYLASE in the last 168 hours. No results for input(s): AMMONIA in the last 168 hours. Coagulation Profile: No results for input(s): INR, PROTIME in the last 168 hours. Cardiac Enzymes: No results for input(s): CKTOTAL, CKMB, CKMBINDEX, TROPONINI in the last 168 hours. BNP (last 3  results) No results for input(s): PROBNP in the last 8760 hours. HbA1C: No results for input(s): HGBA1C in the last 72 hours. CBG: Recent Labs  Lab 04/08/21 1716 04/08/21 2153 04/09/21 0743 04/09/21 0817 04/09/21 1147  GLUCAP 99 88 47* 77 84   Lipid Profile: No results for input(s): CHOL, HDL, LDLCALC, TRIG, CHOLHDL, LDLDIRECT in the last 72 hours. Thyroid Function Tests: No results for input(s): TSH, T4TOTAL, FREET4, T3FREE, THYROIDAB in the last 72 hours. Anemia Panel: No results for input(s): VITAMINB12, FOLATE, FERRITIN, TIBC, IRON, RETICCTPCT in the last 72 hours. Sepsis Labs: Recent Labs  Lab 04/06/21 1129 04/08/21 0337  PROCALCITON  --  <0.10  LATICACIDVEN 1.7  --     Recent Results (from the past 240 hour(s))  Resp Panel by RT-PCR (Flu A&B, Covid) Nasopharyngeal Swab     Status: None   Collection Time: 04/06/21 11:29 AM   Specimen: Nasopharyngeal Swab; Nasopharyngeal(NP) swabs in vial transport medium  Result Value Ref Range Status   SARS Coronavirus 2 by RT PCR NEGATIVE NEGATIVE Final     Comment: (NOTE) SARS-CoV-2 target nucleic acids are NOT DETECTED.  The SARS-CoV-2 RNA is generally detectable in upper respiratory specimens during the acute phase of infection. The lowest concentration of SARS-CoV-2 viral copies this assay can detect is 138 copies/mL. A negative result does not preclude SARS-Cov-2 infection and should not be used as the sole basis for treatment or other patient management decisions. A negative result may occur with  improper specimen collection/handling, submission of specimen other than nasopharyngeal swab, presence of viral mutation(s) within the areas targeted by this assay, and inadequate number of viral copies(<138 copies/mL). A negative result must be combined with clinical observations, patient history, and epidemiological information. The expected result is Negative.  Fact Sheet for Patients:  EntrepreneurPulse.com.au  Fact Sheet for Healthcare Providers:  IncredibleEmployment.be  This test is no t yet approved or cleared by the [place] FDA and  has been authorized for detection and/or diagnosis of SARS-CoV-2 by FDA under an Emergency Use Authorization (EUA). This EUA will remain  in effect (meaning this test can be used) for the duration of the COVID-19 declaration under Section 564(b)(1) of the Act, 21 U.S.C.section 360bbb-3(b)(1), unless the authorization is terminated  or revoked sooner.       Influenza A by PCR NEGATIVE NEGATIVE Final   Influenza B by PCR NEGATIVE NEGATIVE Final    Comment: (NOTE) The Xpert Xpress SARS-CoV-2/FLU/RSV plus assay is intended as an aid in the diagnosis of influenza from Nasopharyngeal swab specimens and should not be used as a sole basis for treatment. Nasal washings and aspirates are unacceptable for Xpert Xpress SARS-CoV-2/FLU/RSV testing.  Fact Sheet for Patients: EntrepreneurPulse.com.au  Fact Sheet for Healthcare  Providers: IncredibleEmployment.be  This test is not yet approved or cleared by the [place] FDA and has been authorized for detection and/or diagnosis of SARS-CoV-2 by FDA under an Emergency Use Authorization (EUA). This EUA will remain in effect (meaning this test can be used) for the duration of the COVID-19 declaration under Section 564(b)(1) of the Act, 21 U.S.C. section 360bbb-3(b)(1), unless the authorization is terminated or revoked.  Performed at [facility], [address] [place], [place] [address]   Blood culture (routine x 2)     Status: None (Preliminary result)   Collection Time: 04/06/21 11:39 AM   Specimen: BLOOD  Result Value Ref Range Status   Specimen Description   Final    BLOOD LEFT ANTECUBITAL  Performed at [facility], [address] [place], [place] [address]    Special Requests   Final    NONE Performed at [facility], [address] [place], [place] [address]    Culture   Final    NO GROWTH 3 DAYS Performed at [facility], [place] [address] [place], [place] [address]    Report Status PENDING  Incomplete  Blood culture (routine x 2)     Status: None (Preliminary result)   Collection Time: 04/06/21 11:54 AM   Specimen: BLOOD  Result Value Ref Range Status   Specimen Description   Final    BLOOD RIGHT WRIST Performed at [facility], [address] [place], [place] [address]    Special Requests   Final    BOTTLES DRAWN AEROBIC AND ANAEROBIC Blood Culture adequate volume Performed at [facility], [address] [place], [place] [address]    Culture   Final    NO GROWTH 3 DAYS Performed at [facility], [place] [address], [place], [place] [address]    Report Status PENDING  Incomplete  MRSA PCR Screening     Status: None   Collection Time: 04/06/21  5:27 PM  Result Value Ref Range Status   MRSA by PCR NEGATIVE NEGATIVE Final    Comment:        The  GeneXpert MRSA Assay (FDA approved for NASAL specimens only), is one component of a comprehensive MRSA colonization surveillance program. It is not intended to diagnose MRSA infection nor to guide or monitor treatment for MRSA infections. Performed at [facility], [place] [address] [place], [place] [address]          Radiology Studies: No results found.      Scheduled Meds:  apixaban  2.5 mg Oral BID   arformoterol  15 mcg Nebulization Q12H   atorvastatin  40 mg Oral Daily   FLUoxetine  40 mg Oral Daily   guaiFENesin  600 mg Oral BID   insulin aspart  0-5 Units Subcutaneous QHS   insulin aspart  0-6 Units Subcutaneous TID WC   levothyroxine  50 mcg Oral QAC breakfast   metoprolol tartrate  50 mg Oral BID   pantoprazole (PROTONIX) IV  40 mg Intravenous Q12H   polyethylene glycol  17 g Oral Daily   pramipexole  0.25 mg Oral QAC supper   pramipexole  0.75 mg Oral QHS   pregabalin  100 mg Oral QHS   revefenacin  175 mcg Nebulization Daily   sodium chloride flush  3 mL Intravenous Q12H   sucralfate  1 g Oral TID WC & HS   traZODone  100 mg Oral QHS   Continuous Infusions:  sodium chloride Stopped (04/06/21 1558)   cefTRIAXone (ROCEPHIN)  IV 2 g (04/09/21 0853)     LOS: 2 days    Time spent: 35 mins. More than 50% of that time was spent in counseling and/or coordination of care.      Shelly Coss, MD [facility] P6/27/2022, 1:17 PM

## 2021-04-09 NOTE — Progress Notes (Signed)
Patient ID: Melissa [name], female   DOB: 29-Jan-1928, 87 y.o.   MRN: [id number] [facility] Gastroenterology Progress Note  Melissa [name] 87 y.o. 1928/08/20   Subjective: Sleeping comfortably. Easily arousable. Denies trouble with full liquid diet yesterday. Denies pain with swallowing or vomiting.  Objective: Vital signs: Vitals:   04/08/21 2155 04/09/21 0640  BP: 123/68 116/71  Pulse: 89 (!) 54  Resp: 20 16  Temp: 97.7 F (36.5 C) 97.7 F (36.5 C)  SpO2: 100% 93%    Physical Exam: Gen: lethargic, elderly, well-nourished, no acute distress  HEENT: anicteric sclera CV: RRR Chest: CTA B Abd: soft, nontender, nondistended, +BS Ext: no edema  Lab Results: Recent Labs    04/08/21 0337 04/09/21 0349  NA 138 136  K 4.3 3.7  CL 107 106  CO2 24 23  GLUCOSE 111* 62*  BUN 33* 27*  CREATININE 1.55* 1.37*  CALCIUM 8.6* 8.8*  MG 1.6* 2.0   No results for input(s): AST, ALT, ALKPHOS, BILITOT, PROT, ALBUMIN in the last 72 hours. Recent Labs    04/06/21 1129 04/07/21 0303 04/08/21 0337  WBC 7.2 6.9 10.3  NEUTROABS 5.5  --  8.1*  HGB 12.3 11.7* 12.2  HCT 39.2 38.2 40.3  MCV 89.1 90.7 93.1  PLT 177 157 155      Assessment/Plan: Nausea/vomiting and odynophagia improving with Sucralfate and Protonix. Continue conservative management. Changed diet to soft diet. Supportive care. Hold off on EGD unless fails to tolerate diet advancement. Will follow.   Melissa [name] 04/09/2021, 7:52 AM  Questions please call [phone]

## 2021-04-09 NOTE — Progress Notes (Signed)
Physical Therapy Treatment Patient Details Name: Melissa [name] MRN: [id number] DOB: [birth year] Today's Date: 04/09/2021    History of Present Illness 87 year old female with  PMH including atrial fibrillation, asthma, remote smoker, presented with severe coughing and multiple episodes of emesis.  Found to have atrial fibrillation with rapid ventricular response and suspicion for right lower lobe pneumonia and  admitted to SDU 04/06/21    PT Comments    Progressing with mobility.    Follow Up Recommendations  Home health PT     Equipment Recommendations  None recommended by PT    Recommendations for Other Services       Precautions / Restrictions Precautions Precautions: Fall Precaution Comments: monitor HR, low vision/deficits Restrictions Weight Bearing Restrictions: No    Mobility  Bed Mobility               General bed mobility comments: in recliner    Transfers Overall transfer level: Needs assistance Equipment used: Rolling walker (2 wheeled) Transfers: Sit to/from Stand Sit to Stand: Supervision            Ambulation/Gait Ambulation/Gait assistance: Min assist Gait Distance (Feet): 75 Feet Assistive device: Rolling walker (2 wheeled) Gait Pattern/deviations: Step-through pattern;Decreased stride length     General Gait Details: Walked without RW this session-used hallway handrail + 1 HHA-unsteady. Will likely need to use RW at home for ambulation safety initially.   Stairs             Wheelchair Mobility    Modified Rankin (Stroke Patients Only)       Balance Overall balance assessment: Needs assistance           Standing balance-Leahy Scale: Fair                              Cognition Arousal/Alertness: Awake/alert Behavior During Therapy: WFL for tasks assessed/performed Overall Cognitive Status: Within Functional Limits for tasks assessed                                        Exercises       General Comments        Pertinent Vitals/Pain Pain Assessment: No/denies pain    Home Living                      Prior Function            PT Goals (current goals can now be found in the care plan section) Progress towards PT goals: Progressing toward goals    Frequency    Min 3X/week      PT Plan Current plan remains appropriate    Co-evaluation              AM-PAC PT "6 Clicks" Mobility   Outcome Measure  Help needed turning from your back to your side while in a flat bed without using bedrails?: A Little Help needed moving from lying on your back to sitting on the side of a flat bed without using bedrails?: A Little Help needed moving to and from a bed to a chair (including a wheelchair)?: A Little Help needed standing up from a chair using your arms (e.g., wheelchair or bedside chair)?: A Little Help needed to walk in hospital room?: A Little Help needed climbing 3-5 steps with a railing? : A Little  6 Click Score: 18    End of Session   Activity Tolerance: Patient tolerated treatment well Patient left: in chair;with call bell/phone within reach   PT Visit Diagnosis: Difficulty in walking, not elsewhere classified (R26.2);Muscle weakness (generalized) (M62.81)     Time: 1211-1220 PT Time Calculation (min) (ACUTE ONLY): 9 min  Charges:  $Gait Training: 8-22 mins              Doreatha Massed, PT Acute Rehabilitation  Office: [phone] Pager: [phone]

## 2021-04-10 LAB — BASIC METABOLIC PANEL
Anion gap: 7 (ref 5–15)
BUN: 26 mg/dL — ABNORMAL HIGH (ref 8–23)
CO2: 27 mmol/L (ref 22–32)
Calcium: 8.7 mg/dL — ABNORMAL LOW (ref 8.9–10.3)
Chloride: 103 mmol/L (ref 98–111)
Creatinine, Ser: 1.52 mg/dL — ABNORMAL HIGH (ref 0.44–1.00)
GFR, Estimated: 32 mL/min — ABNORMAL LOW (ref >60)
Glucose, Bld: 121 mg/dL — ABNORMAL HIGH (ref 70–99)
Potassium: 3.8 mmol/L (ref 3.5–5.1)
Sodium: 137 mmol/L (ref 135–145)

## 2021-04-10 LAB — GLUCOSE, CAPILLARY
Glucose-Capillary: 138 mg/dL — ABNORMAL HIGH (ref 70–99)
Glucose-Capillary: 149 mg/dL — ABNORMAL HIGH (ref 70–99)

## 2021-04-10 MED ORDER — SUCRALFATE 1 GM/10ML PO SUSP: 1.0000 g | Freq: Three times a day (TID) | ORAL | 0 refills | Status: DC

## 2021-04-10 MED ORDER — METOPROLOL TARTRATE 50 MG PO TABS: 50.0000 mg | ORAL_TABLET | Freq: Two times a day (BID) | ORAL | 1 refills | Status: DC

## 2021-04-10 MED ORDER — PANTOPRAZOLE SODIUM 40 MG PO TBEC: 40.0000 mg | DELAYED_RELEASE_TABLET | Freq: Two times a day (BID) | ORAL | 0 refills | Status: DC

## 2021-04-10 MED ORDER — CEFDINIR 300 MG PO CAPS: 300.0000 mg | ORAL_CAPSULE | Freq: Two times a day (BID) | ORAL | 0 refills | Status: DC

## 2021-04-10 MED ORDER — GUAIFENESIN ER 600 MG PO TB12: 600.0000 mg | ORAL_TABLET | Freq: Two times a day (BID) | ORAL | 0 refills | Status: AC

## 2021-04-10 MED ORDER — POLYETHYLENE GLYCOL 3350 17 G PO PACK: 17.0000 g | PACK | Freq: Every day | ORAL | 0 refills | Status: DC

## 2021-04-10 MED ORDER — GUAIFENESIN-DM 100-10 MG/5ML PO SYRP
5.0000 mL | ORAL_SOLUTION | ORAL | Status: DC | PRN
  Administered 2021-04-10: 5 mL via ORAL
  Filled 2021-04-10: qty 10

## 2021-04-10 NOTE — Care Management Important Message (Signed)
Important Message  Patient Details IM Letter placed in Patient's room. Name: RHIANA MORASH MRN: [id number] Date of Birth: 1927-10-23   Medicare Important Message Given:  Yes     Kerin Salen 04/10/2021, 8:47 AM

## 2021-04-10 NOTE — Progress Notes (Signed)
[facility] Gastroenterology Progress Note  Melissa [name] 87 y.o. 08-Oct-1928   Subjective: Sitting in bedside chair. Tolerating soft food without pain, N/V. Daughter in room.  Objective: Vital signs: Vitals:   04/10/21 0818 04/10/21 0843  BP:    Pulse:    Resp:    Temp:    SpO2: 90% (!) 88%    Physical Exam: Gen: lethargic, elderly, pleasant, well-nourished, no acute distress  HEENT: anicteric sclera CV: RRR Chest: CTA B Abd: soft, nontender, nondistended, +BS Ext: no edema  Lab Results: Recent Labs    04/08/21 0337 04/09/21 0349 04/10/21 0342  NA 138 136 137  K 4.3 3.7 3.8  CL 107 106 103  CO2 24 23 27   GLUCOSE 111* 62* 121*  BUN 33* 27* 26*  CREATININE 1.55* 1.37* 1.52*  CALCIUM 8.6* 8.8* 8.7*  MG 1.6* 2.0  --    No results for input(s): AST, ALT, ALKPHOS, BILITOT, PROT, ALBUMIN in the last 72 hours. Recent Labs    04/08/21 0337  WBC 10.3  NEUTROABS 8.1*  HGB 12.2  HCT 40.3  MCV 93.1  PLT 155      Assessment/Plan: Resolved odynophagia and N/V with conservative management on PPI BID and Sucralfate. Change to PPI PO BID at d/c and continue for 2 months and then change to QD. Continue Sucralfate QAC and QHS for 2 weeks and then d/c. Cautioned that the Sucralfate can cause constipation. No plans for EGD. Melissa [name] for d/c today from GI standpoint. F/U with GI per Dr. Perley Jain previous recommendations. Will sign off. Call if questions.   Melissa [name] 04/10/2021, 10:32 AM  Questions please call [phone] Patient ID: Melissa [name], female   DOB: 06/29/1928, 87 y.o.   MRN: [id number]

## 2021-04-10 NOTE — TOC Progression Note (Signed)
Transition of Care [facility]) - Progression Note    Patient Details  Name: Melissa [name] MRN: [id number] Date of Birth: 06-Apr-1928  Transition of [facility]) CM/SW Contact  Purcell Mouton, RN Phone Number: 04/10/2021, 11:26 AM  Clinical Narrative:    Spoke with pt and daughter at bedside concerning discharge with [facility]. [facility] was selected. Referral given to in house rep.         Expected Discharge Plan and Services           Expected Discharge Date: 04/10/21                                     Social Determinants of Health (SDOH) Interventions    Readmission Risk Interventions No flowsheet data found.

## 2021-04-10 NOTE — Discharge Summary (Signed)
Physician Discharge Summary  Melissa [name] HRC:[id number] DOB: 04/29/1928 DOA: 04/06/2021  PCP: Darreld Mclean, MD  Admit date: 04/06/2021 Discharge date: 04/10/2021  Admitted From: Home Disposition:  Home  Discharge Condition:Stable CODE STATUS:FULL Diet recommendation: Heart Healthy  Brief/Interim Summary: Patient is a 87 year old female with history of paroxysmal A. fib, asthma, previous tobacco use presented with severe cough, multiple episodes of vomiting.  Also found to have A. fib with RVR on presentation, also suspected to have right lower lobe pneumonia and was admitted for further management.  She was started on antibiotic treatment for right lower lobe pneumonia.  Hospital course was remarkable for persistent nausea/vomiting/odynophagia for which GI was consulted.  Patient was a started on Carafate and Protonix which improved her symptoms.  GI did not recommend EGD.  She is medically stable for discharge to home today.  PT recommended home health on discharge.  Following problems were addressed during her hospitalization:  Right lower lobe pneumonia: Chest x-ray showed chronic bronchitic changes.  Presented with nonproductive cough, tachypnea, shortness of breath.  Afebrile, no leukocytosis on presentation.  Urine streptococcal pneumonia antigen negative.  Procalcitonin was unimpressive.  Blood cultures have not shown any growth.   She was empirically started on ceftriaxone, azithromycin for the treatment of community acquired pneumonia. Currently she is on room air.  Antibiotics changed to oral.   Nausea/vomiting/odynophagia: Found to have small hiatal hernia and recent CT abdomen, no prior is in the system.  GI consulted for persistent nausea/vomiting.  Recommended Protonix and Carafate with symptomatic improvement.  Currently on soft diet. We recommend Protonix twice daily for 2 months and then continue daily.  Carafate recommended for 2 weeks.   Paroxysmal A. fib: On RVR on  presentation.  Currently rate is controlled with Lopressor.  On Eliquis for anticoagulation.   Chronic systolic congestive heart failure: Appeared dehydrated on presentation due to poor oral intake.    She was started on IV fluids, now stopped.  She has cardiology appointment on Wednesday.  Continue home Lasix   Insulin-dependent diabetes type 2: Recent hemoglobin A1c of 8.2 as per 5/22.  Continue home regimen  CKD stage IIIb: Currently kidney function at baseline.   Hypothyroidism: Continue Synthyroid   Debility/deconditioning: Patient seen by PT and recommend home health on discharge.       Discharge Diagnoses:  Principal Problem:   Atrial fibrillation with rapid ventricular response (HCC) PMH PAF Active Problems:   Type 2 diabetes mellitus with diabetic neuropathy, with long-term current use of insulin (HCC)   Hypothyroidism, acquired   CKD stage IIIb (HCC)   Chronic anticoagulation   Chronic systolic heart failure (HCC)   Dyspnea   NICM (nonischemic cardiomyopathy) ([place])   Pneumonia   Bronchiectasis without complication ([place])   Obstructive lung disease ([place])   Acquired thrombophilia ([place])    Discharge Instructions  Discharge Instructions     Diet - low sodium heart healthy   Complete by: As directed    Discharge instructions   Complete by: As directed    1)Please take prescribed medications as instructed. 2)Take soft diet for next 1-2 days.  Always take your food on an upright position, take small bites and take frequent breaks  3)Monitor your blood sugar 4)Follow up with your PCP in a week 5)Follow up with your cardiologist as an outpatient.   Increase activity slowly   Complete by: As directed       Allergies as of 04/10/2021   No Known Allergies  Medication List     STOP taking these medications    metoprolol succinate 50 MG 24 hr tablet Commonly known as: TOPROL-XL       TAKE these medications    acetaminophen 500 MG tablet Commonly  known as: TYLENOL Take 1,000 mg by mouth every 6 (six) hours as needed for mild pain or headache. Reported on 11/02/2015   apixaban 2.5 MG Tabs tablet Commonly known as: ELIQUIS Take 1 tablet (2.5 mg total) by mouth 2 (two) times daily.   atorvastatin 40 MG tablet Commonly known as: LIPITOR TAKE 1 TABLET DAILY   benzonatate 200 MG capsule Commonly known as: TESSALON TAKE 1 CAPSULE BY MOUTH EVERY DAY 3 TIMES A DAY AS NEEDED FOR COUGH   blood glucose meter kit and supplies Kit Dispense based on patient and insurance preference. Use up to four times daily as directed.   Blood Glucose Monitoring Suppl Devi Dispense one meter to use and monitor glucose as needed   cefdinir 300 MG capsule Commonly known as: OMNICEF Take 1 capsule (300 mg total) by mouth 2 (two) times daily for 2 days. Start taking on: April 11, 2021   cetirizine 10 MG tablet Commonly known as: ZYRTEC Take 10 mg by mouth daily.   Fish Oil 1000 MG Caps Take 1,000 mg by mouth at bedtime.   FLUoxetine 40 MG capsule Commonly known as: PROZAC Take 1 capsule (40 mg total) by mouth daily.   Flutter Devi 1 Device by Does not apply route as needed.   furosemide 40 MG tablet Commonly known as: LASIX Take 1 tablet (40 mg total) by mouth daily.   guaiFENesin 600 MG 12 hr tablet Commonly known as: MUCINEX Take 1 tablet (600 mg total) by mouth 2 (two) times daily for 7 days.   HumaLOG KwikPen 100 UNIT/ML KwikPen Generic drug: insulin lispro Inject 6 units under the skin daily with supper.   ICaps Areds 2 Caps Take 1 capsule by mouth 2 (two) times daily.   Integra 62.5-62.5-40-3 MG Caps Take 1 capsule by mouth daily.   Levemir FlexTouch 100 UNIT/ML FlexPen Generic drug: insulin detemir Inject 25 Units into the skin daily.   levETIRAcetam 250 MG tablet Commonly known as: KEPPRA Take 1 tablet (250 mg total) by mouth at bedtime.   levothyroxine 50 MCG tablet Commonly known as: SYNTHROID Take 1 tablet (50 mcg  total) by mouth daily before breakfast.   metoprolol tartrate 50 MG tablet Commonly known as: LOPRESSOR Take 1 tablet (50 mg total) by mouth 2 (two) times daily.   nystatin powder Commonly known as: MYCOSTATIN/NYSTOP Apply 1 application topically 3 (three) times daily. Apply under breasts or other skin folds as needed   ondansetron 8 MG tablet Commonly known as: ZOFRAN Take 1 tablet (8 mg total) by mouth every 8 (eight) hours as needed for nausea or vomiting.   OneTouch Delica Lancets 27P Misc Use to check blood sugar 4 times per day dx code E11.29   OneTouch Verio test strip Generic drug: glucose blood USE AS INSTRUCTED TO CHECK BLOOD SUGAR 4 TIMES PER DAY   Ozempic (0.25 or 0.5 MG/DOSE) 2 MG/1.5ML Sopn Generic drug: Semaglutide(0.25 or 0.5MG/DOS) INJECT 0.5 MG UNDER THE SKIN EVERY SUNDAY   pantoprazole 40 MG tablet Commonly known as: PROTONIX Take 1 tablet (40 mg total) by mouth 2 (two) times daily. Continue taking once a day after finishing the course of twice a day for  2 months What changed:  when to take this additional instructions  Perforomist 20 MCG/2ML nebulizer solution Generic drug: formoterol Take 2 mLs (20 mcg total) by nebulization 2 (two) times daily.   polyethylene glycol 17 g packet Commonly known as: MIRALAX / GLYCOLAX Take 17 g by mouth daily. Start taking on: April 11, 2021   pramipexole 0.75 MG tablet Commonly known as: MIRAPEX TAKE 1 TABLET AT BEDTIME   pramipexole 0.25 MG tablet Commonly known as: Mirapex Take 1 tablet at 5 pm   sucralfate 1 GM/10ML suspension Commonly known as: CARAFATE Take 10 mLs (1 g total) by mouth 4 (four) times daily -  with meals and at bedtime for 14 days.   traZODone 100 MG tablet Commonly known as: DESYREL Take 1 tablet (100 mg total) by mouth at bedtime.   Vitamin D3 25 MCG (1000 UT) Caps Take 1,000 Units by mouth daily. 25 mcg   Yupelri 175 MCG/3ML nebulizer solution Generic drug: revefenacin Take 3  mLs (175 mcg total) by nebulization daily.       ASK your doctor about these medications    lidocaine 5 % Commonly known as: [place] 1 patch onto the skin daily. Remove & Discard patch within 12 hours or as directed by MD   pregabalin 50 MG capsule Commonly known as: Lyrica Take 1-2 capsules (50-100 mg total) by mouth at bedtime.        Follow-up Information     Copland, Gay Filler, MD. Schedule an appointment as soon as possible for a visit in 1 week(s).   Specialty: Family Medicine Contact information: [address] [place] [address] [phone]         [name], MD .   Specialty: Cardiology Contact information: [phone] [address] [place] [address] [phone]                No Known Allergies  Consultations: GI   Procedures/Studies: DG Ankle Complete Right  Result Date: 03/19/2021 CLINICAL DATA:  RIGHT foot and ankle pain, fell a week ago, swelling and bruising EXAM: RIGHT ANKLE - COMPLETE 3+ VIEW COMPARISON:  None FINDINGS: Osseous demineralization. Scattered soft tissue swelling. Joint spaces preserved. No acute fracture, dislocation, or bone destruction. IMPRESSION: No acute osseous abnormalities. Electronically Signed   By: Lavonia Dana M.D.   On: 03/19/2021 16:53   DG Chest Port 1 View  Result Date: 04/06/2021 CLINICAL DATA:  Shortness of breath and cough for 3 days, history asthma, CHF EXAM: PORTABLE CHEST 1 VIEW COMPARISON:  Portable exam 1104 hours compared to 03/09/2021 FINDINGS: Upper normal heart size. Mediastinal contours and pulmonary vascularity normal. Atherosclerotic calcification aorta. Chronic bronchitic changes without pulmonary infiltrate, pleural effusion, or pneumothorax. Osseous demineralization with prior proximal RIGHT humeral ORIF. IMPRESSION: Chronic bronchitic changes. No acute abnormalities. Aortic Atherosclerosis (ICD10-I70.0). Electronically Signed   By: Lavonia Dana M.D.   On:  04/06/2021 11:43   DG Foot 2 Views Right  Result Date: 03/19/2021 CLINICAL DATA:  [place] 2 weeks ago, RIGHT ankle and foot pain EXAM: RIGHT FOOT - 2 VIEW COMPARISON:  None FINDINGS: Osseous demineralization. Minimal hallux valgus. Joint spaces preserved. Scattered soft tissue swelling at ankle. No acute fracture, dislocation, or bone destruction. IMPRESSION: No acute osseous abnormalities. Electronically Signed   By: Lavonia Dana M.D.   On: 03/19/2021 16:49      Subjective:  Patient seen and examined the bedside this morning.  Medically stable for discharge today.  Discharge planning discussed with the daughter at the bedside   Discharge Exam:  Vitals:   04/10/21 0818 04/10/21 0843  BP:    Pulse:    Resp:    Temp:    SpO2: 90% (!) 88%   Vitals:   04/09/21 2038 04/10/21 0700 04/10/21 0818 04/10/21 0843  BP: 122/77 (!) 110/54    Pulse: 85 82    Resp: 20 20    Temp: 97.9 F (36.6 C) 97.8 F (36.6 C)    TempSrc: Oral     SpO2: 96% 92% 90% (!) 88%  Weight:      Height:        General: Pt is alert, awake, not in acute distress Cardiovascular: RRR, S1/S2 +, no rubs, no gallops Respiratory: Few crackles in the right base, no wheezing, no rhonchi Abdominal: Soft, NT, ND, bowel sounds + Extremities: no edema, no cyanosis    The results of significant diagnostics from this hospitalization (including imaging, microbiology, ancillary and laboratory) are listed below for reference.     Microbiology: Recent Results (from the past 240 hour(s))  Resp Panel by RT-PCR (Flu A&B, Covid) Nasopharyngeal Swab     Status: None   Collection Time: 04/06/21 11:29 AM   Specimen: Nasopharyngeal Swab; Nasopharyngeal(NP) swabs in vial transport medium  Result Value Ref Range Status   SARS Coronavirus 2 by RT PCR NEGATIVE NEGATIVE Final    Comment: (NOTE) SARS-CoV-2 target nucleic acids are NOT DETECTED.  The SARS-CoV-2 RNA is generally detectable in upper respiratory specimens during the acute  phase of infection. The lowest concentration of SARS-CoV-2 viral copies this assay can detect is 138 copies/mL. A negative result does not preclude SARS-Cov-2 infection and should not be used as the sole basis for treatment or other patient management decisions. A negative result may occur with  improper specimen collection/handling, submission of specimen other than nasopharyngeal swab, presence of viral mutation(s) within the areas targeted by this assay, and inadequate number of viral copies(<138 copies/mL). A negative result must be combined with clinical observations, patient history, and epidemiological information. The expected result is Negative.  Fact Sheet for Patients:  EntrepreneurPulse.com.au  Fact Sheet for Healthcare Providers:  IncredibleEmployment.be  This test is no t yet approved or cleared by the [place] FDA and  has been authorized for detection and/or diagnosis of SARS-CoV-2 by FDA under an Emergency Use Authorization (EUA). This EUA will remain  in effect (meaning this test can be used) for the duration of the COVID-19 declaration under Section 564(b)(1) of the Act, 21 U.S.C.section 360bbb-3(b)(1), unless the authorization is terminated  or revoked sooner.       Influenza A by PCR NEGATIVE NEGATIVE Final   Influenza B by PCR NEGATIVE NEGATIVE Final    Comment: (NOTE) The Xpert Xpress SARS-CoV-2/FLU/RSV plus assay is intended as an aid in the diagnosis of influenza from Nasopharyngeal swab specimens and should not be used as a sole basis for treatment. Nasal washings and aspirates are unacceptable for Xpert Xpress SARS-CoV-2/FLU/RSV testing.  Fact Sheet for Patients: EntrepreneurPulse.com.au  Fact Sheet for Healthcare Providers: IncredibleEmployment.be  This test is not yet approved or cleared by the [place] FDA and has been authorized for detection and/or diagnosis of  SARS-CoV-2 by FDA under an Emergency Use Authorization (EUA). This EUA will remain in effect (meaning this test can be used) for the duration of the COVID-19 declaration under Section 564(b)(1) of the Act, 21 U.S.C. section 360bbb-3(b)(1), unless the authorization is terminated or revoked.  Performed at [facility], [address] [place], [place] [address]  Blood culture (routine x 2)     Status: None (Preliminary result)   Collection Time: 04/06/21 11:39 AM   Specimen: BLOOD  Result Value Ref Range Status   Specimen Description   Final    BLOOD LEFT ANTECUBITAL Performed at [facility], [address] [place], [place] [address]    Special Requests   Final    NONE Performed at [facility], [address] [place], [place] [address]    Culture   Final    NO GROWTH 4 DAYS Performed at [facility], [place] [address], [place], [place] [address]    Report Status PENDING  Incomplete  Blood culture (routine x 2)     Status: None (Preliminary result)   Collection Time: 04/06/21 11:54 AM   Specimen: BLOOD  Result Value Ref Range Status   Specimen Description   Final    BLOOD RIGHT WRIST Performed at [facility], [address] [place], [place] [address]    Special Requests   Final    BOTTLES DRAWN AEROBIC AND ANAEROBIC Blood Culture adequate volume Performed at [facility], [address] [place], [place] [address]    Culture   Final    NO GROWTH 4 DAYS Performed at [facility], [place] [address] [place], [place] [address]    Report Status PENDING  Incomplete  MRSA PCR Screening     Status: None   Collection Time: 04/06/21  5:27 PM  Result Value Ref Range Status   MRSA by PCR NEGATIVE NEGATIVE Final    Comment:        The GeneXpert MRSA Assay (FDA approved for NASAL specimens only), is one component of a comprehensive MRSA colonization surveillance program. It is not intended to diagnose  MRSA infection nor to guide or monitor treatment for MRSA infections. Performed at [facility], [place] [address] [place], [place] [address]      Labs: BNP (last 3 results) Recent Labs    12/07/20 1710 03/09/21 1519 04/06/21 1129  BNP 237.9* 208.0* 446.2*   Basic Metabolic Panel: Recent Labs  Lab 04/06/21 1129 04/07/21 0303 04/08/21 0337 04/09/21 0349 04/10/21 0342  NA 137 139 138 136 137  K 3.8 4.3 4.3 3.7 3.8  CL 103 105 107 106 103  CO2 27 26 24 23 27   GLUCOSE 226* 206* 111* 62* 121*  BUN 21 26* 33* 27* 26*  CREATININE 1.35* 1.45* 1.55* 1.37* 1.52*  CALCIUM 9.1 8.7* 8.6* 8.8* 8.7*  MG  --   --  1.6* 2.0  --    Liver Function Tests: No results for input(s): AST, ALT, ALKPHOS, BILITOT, PROT, ALBUMIN in the last 168 hours. No results for input(s): LIPASE, AMYLASE in the last 168 hours. No results for input(s): AMMONIA in the last 168 hours. CBC: Recent Labs  Lab 04/06/21 1129 04/07/21 0303 04/08/21 0337  WBC 7.2 6.9 10.3  NEUTROABS 5.5  --  8.1*  HGB 12.3 11.7* 12.2  HCT 39.2 38.2 40.3  MCV 89.1 90.7 93.1  PLT 177 157 155   Cardiac Enzymes: No results for input(s): CKTOTAL, CKMB, CKMBINDEX, TROPONINI in the last 168 hours. BNP: Invalid input(s): POCBNP CBG: Recent Labs  Lab 04/09/21 0817 04/09/21 1147 04/09/21 1641 04/09/21 2038 04/10/21 0813  GLUCAP 77 84 117* 129* 138*   D-Dimer No results for input(s): DDIMER in the last 72 hours. Hgb A1c No results for input(s): HGBA1C  in the last 72 hours. Lipid Profile No results for input(s): CHOL, HDL, LDLCALC, TRIG, CHOLHDL, LDLDIRECT in the last 72 hours. Thyroid function studies No results for input(s): TSH, T4TOTAL, T3FREE, THYROIDAB in the last 72 hours.  Invalid input(s): FREET3 Anemia work up No results for input(s): VITAMINB12, FOLATE, FERRITIN, TIBC, IRON, RETICCTPCT in the last 72 hours. Urinalysis    Component Value Date/Time   COLORURINE YELLOW 03/09/2021 1634    APPEARANCEUR CLOUDY (A) 03/09/2021 1634   LABSPEC 1.020 03/09/2021 1634   PHURINE 5.5 03/09/2021 1634   GLUCOSEU NEGATIVE 03/09/2021 1634   GLUCOSEU NEGATIVE 09/22/2019 1032   HGBUR TRACE (A) 03/09/2021 1634   BILIRUBINUR negative 03/19/2021 1539   KETONESUR NEGATIVE 03/09/2021 1634   PROTEINUR Positive (A) 03/19/2021 1539   PROTEINUR NEGATIVE 03/09/2021 1634   UROBILINOGEN 0.2 03/19/2021 1539   UROBILINOGEN 0.2 09/22/2019 1032   NITRITE negative 03/19/2021 1539   NITRITE NEGATIVE 03/09/2021 1634   LEUKOCYTESUR Small (1+) (A) 03/19/2021 1539   LEUKOCYTESUR MODERATE (A) 03/09/2021 1634   Sepsis Labs Invalid input(s): PROCALCITONIN,  WBC,  LACTICIDVEN Microbiology Recent Results (from the past 240 hour(s))  Resp Panel by RT-PCR (Flu A&B, Covid) Nasopharyngeal Swab     Status: None   Collection Time: 04/06/21 11:29 AM   Specimen: Nasopharyngeal Swab; Nasopharyngeal(NP) swabs in vial transport medium  Result Value Ref Range Status   SARS Coronavirus 2 by RT PCR NEGATIVE NEGATIVE Final    Comment: (NOTE) SARS-CoV-2 target nucleic acids are NOT DETECTED.  The SARS-CoV-2 RNA is generally detectable in upper respiratory specimens during the acute phase of infection. The lowest concentration of SARS-CoV-2 viral copies this assay can detect is 138 copies/mL. A negative result does not preclude SARS-Cov-2 infection and should not be used as the sole basis for treatment or other patient management decisions. A negative result may occur with  improper specimen collection/handling, submission of specimen other than nasopharyngeal swab, presence of viral mutation(s) within the areas targeted by this assay, and inadequate number of viral copies(<138 copies/mL). A negative result must be combined with clinical observations, patient history, and epidemiological information. The expected result is Negative.  Fact Sheet for Patients:  EntrepreneurPulse.com.au  Fact Sheet for  Healthcare Providers:  IncredibleEmployment.be  This test is no t yet approved or cleared by the [place] FDA and  has been authorized for detection and/or diagnosis of SARS-CoV-2 by FDA under an Emergency Use Authorization (EUA). This EUA will remain  in effect (meaning this test can be used) for the duration of the COVID-19 declaration under Section 564(b)(1) of the Act, 21 U.S.C.section 360bbb-3(b)(1), unless the authorization is terminated  or revoked sooner.       Influenza A by PCR NEGATIVE NEGATIVE Final   Influenza B by PCR NEGATIVE NEGATIVE Final    Comment: (NOTE) The Xpert Xpress SARS-CoV-2/FLU/RSV plus assay is intended as an aid in the diagnosis of influenza from Nasopharyngeal swab specimens and should not be used as a sole basis for treatment. Nasal washings and aspirates are unacceptable for Xpert Xpress SARS-CoV-2/FLU/RSV testing.  Fact Sheet for Patients: EntrepreneurPulse.com.au  Fact Sheet for Healthcare Providers: IncredibleEmployment.be  This test is not yet approved or cleared by the [place] FDA and has been authorized for detection and/or diagnosis of SARS-CoV-2 by FDA under an Emergency Use Authorization (EUA). This EUA will remain in effect (meaning this test can be used) for the duration of the COVID-19 declaration under Section 564(b)(1) of the Act, 21 U.S.C. section 360bbb-3(b)(1), unless the  authorization is terminated or revoked.  Performed at [facility], [address] [place], [place] [address]   Blood culture (routine x 2)     Status: None (Preliminary result)   Collection Time: 04/06/21 11:39 AM   Specimen: BLOOD  Result Value Ref Range Status   Specimen Description   Final    BLOOD LEFT ANTECUBITAL Performed at [facility], [address] [place], [place] [address]    Special Requests   Final    NONE Performed at [facility], [address] [place], [place] [address]    Culture   Final    NO GROWTH 4 DAYS Performed at [facility], [place] [address], [place], [place] [address]    Report Status PENDING  Incomplete  Blood culture (routine x 2)     Status: None (Preliminary result)   Collection Time: 04/06/21 11:54 AM   Specimen: BLOOD  Result Value Ref Range Status   Specimen Description   Final    BLOOD RIGHT WRIST Performed at [facility], [address] [place], [place] [address]    Special Requests   Final    BOTTLES DRAWN AEROBIC AND ANAEROBIC Blood Culture adequate volume Performed at [facility], [address] [place], [place] [address]    Culture   Final    NO GROWTH 4 DAYS Performed at [facility], [place] [name], [place], [place] [address]    Report Status PENDING  Incomplete  MRSA PCR Screening     Status: None   Collection Time: 04/06/21  5:27 PM  Result Value Ref Range Status   MRSA by PCR NEGATIVE NEGATIVE Final    Comment:        The GeneXpert MRSA Assay (FDA approved for NASAL specimens only), is one component of a comprehensive MRSA colonization surveillance program. It is not intended to diagnose MRSA infection nor to guide or monitor treatment for MRSA infections. Performed at [facility], [place] [address] [place], [place] [address]     Please note: You were cared for by a hospitalist during your hospital stay. Once you are discharged, your primary care physician will handle any further medical issues. Please note that NO REFILLS for any discharge medications will be authorized once you are discharged, as it is imperative that you return to your primary care physician (or establish a relationship with a primary care physician if you do not have one) for your post hospital discharge needs so that they can reassess your need for medications and monitor your lab values.    Time coordinating discharge: 40  minutes  SIGNED:   Shelly Coss, MD  [facility] 04/10/2021, 10:56 AM Pager [phone]  If 7PM-7AM, please contact night-coverage [email or web address] Password [email or web address]

## 2021-04-11 ENCOUNTER — Ambulatory Visit (INDEPENDENT_AMBULATORY_CARE_PROVIDER_SITE_OTHER): Payer: Medicare Other | Admitting: Physician Assistant

## 2021-04-11 ENCOUNTER — Other Ambulatory Visit: Payer: Self-pay

## 2021-04-11 ENCOUNTER — Encounter: Payer: Self-pay | Admitting: Physician Assistant

## 2021-04-11 ENCOUNTER — Telehealth: Payer: Self-pay

## 2021-04-11 VITALS — BP 108/60 | HR 78 | Ht 63.5 in | Wt 169.0 lb

## 2021-04-11 DIAGNOSIS — I10 Essential (primary) hypertension: Secondary | ICD-10-CM

## 2021-04-11 DIAGNOSIS — I700 Atherosclerosis of aorta: Secondary | ICD-10-CM

## 2021-04-11 DIAGNOSIS — I5023 Acute on chronic systolic (congestive) heart failure: Secondary | ICD-10-CM | POA: Diagnosis not present

## 2021-04-11 DIAGNOSIS — N1832 Chronic kidney disease, stage 3b: Secondary | ICD-10-CM

## 2021-04-11 DIAGNOSIS — I480 Paroxysmal atrial fibrillation: Secondary | ICD-10-CM | POA: Diagnosis not present

## 2021-04-11 LAB — CULTURE, BLOOD (ROUTINE X 2)
Culture: NO GROWTH
Culture: NO GROWTH
Special Requests: ADEQUATE

## 2021-04-11 MED ORDER — POTASSIUM CHLORIDE CRYS ER 20 MEQ PO TBCR: 20.0000 meq | EXTENDED_RELEASE_TABLET | Freq: Every day | ORAL | 3 refills | Status: DC

## 2021-04-11 NOTE — Telephone Encounter (Signed)
Transition Care Management Unsuccessful Follow-up Telephone Call  Date of discharge and from where:  04/10/2021[facility]  Attempts:  1st Attempt  Reason for unsuccessful TCM follow-up call:  Left voice message

## 2021-04-11 NOTE — Patient Instructions (Addendum)
Medication Instructions:   Your physician has recommended you make the following change in your medication:   INCREASE LASIX ONE TABLET BY MOUTH ( 40 MG) TWICE DAILY x 3 DAYS. IF WEIGHT REMAINS ABOVE 160 STAY ON LASIX TWICE DAILY TILL SEEN BACK IN OFFICE.   START POTASSIUM ONE TABLET BY MOUTH ( 20 MEQ) DAILY.    *If you need a refill on your cardiac medications before your next appointment, please call your pharmacy*   Lab Work:  -None  If you have labs (blood work) drawn today and your tests are completely normal, you will receive your results only by: [place] (if you have MyChart) OR A paper copy in the mail If you have any lab test that is abnormal or we need to change your treatment, we will call you to review the results.   Testing/Procedures:  -None   Follow-Up: At [facility], you and your health needs are our priority.  As part of our continuing mission to provide you with exceptional heart care, we have created designated Provider Care Teams.  These Care Teams include your primary Cardiologist (physician) and Advanced Practice Providers (APPs -  Physician Assistants and Nurse Practitioners) who all work together to provide you with the care you need, when you need it.  We recommend signing up for the patient portal called "MyChart".  Sign up information is provided on this After Visit Summary.  MyChart is used to connect with patients for Virtual Visits (Telemedicine).  Patients are able to view lab/test results, encounter notes, upcoming appointments, etc.  Non-urgent messages can be sent to your provider as well.   To learn more about what you can do with MyChart, go to [email or web address].    Your next appointment:   1 week(s) with Richardson Dopp, PA-C on July 5 @ 12:15 pm.    The format for your next appointment:   In Person  Provider:    Other Instructions  If weight doesn't come down Don't urinate  Breathing gets worse, call office at  [phone] If you are short of breath at rest you need to go the ER.
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
## 2021-04-12 ENCOUNTER — Telehealth: Payer: Self-pay

## 2021-04-12 ENCOUNTER — Encounter (HOSPITAL_BASED_OUTPATIENT_CLINIC_OR_DEPARTMENT_OTHER): Payer: Self-pay | Admitting: *Deleted

## 2021-04-12 ENCOUNTER — Other Ambulatory Visit: Payer: Self-pay

## 2021-04-12 ENCOUNTER — Emergency Department (HOSPITAL_BASED_OUTPATIENT_CLINIC_OR_DEPARTMENT_OTHER): Payer: Medicare Other

## 2021-04-12 ENCOUNTER — Inpatient Hospital Stay (HOSPITAL_BASED_OUTPATIENT_CLINIC_OR_DEPARTMENT_OTHER)
Admission: EM | Admit: 2021-04-12 | Discharge: 2021-04-16 | DRG: 291 | Disposition: A | Discharge status: Home Health | Payer: Medicare Other | Attending: Internal Medicine | Admitting: Internal Medicine

## 2021-04-12 DIAGNOSIS — Z794 Long term (current) use of insulin: Secondary | ICD-10-CM

## 2021-04-12 DIAGNOSIS — E1142 Type 2 diabetes mellitus with diabetic polyneuropathy: Secondary | ICD-10-CM | POA: Diagnosis present

## 2021-04-12 DIAGNOSIS — D631 Anemia in chronic kidney disease: Secondary | ICD-10-CM | POA: Diagnosis present

## 2021-04-12 DIAGNOSIS — I130 Hypertensive heart and chronic kidney disease with heart failure and stage 1 through stage 4 chronic kidney disease, or unspecified chronic kidney disease: Principal | ICD-10-CM | POA: Diagnosis present

## 2021-04-12 DIAGNOSIS — E782 Mixed hyperlipidemia: Secondary | ICD-10-CM

## 2021-04-12 DIAGNOSIS — J449 Chronic obstructive pulmonary disease, unspecified: Secondary | ICD-10-CM | POA: Diagnosis not present

## 2021-04-12 DIAGNOSIS — Z87891 Personal history of nicotine dependence: Secondary | ICD-10-CM

## 2021-04-12 DIAGNOSIS — N1832 Chronic kidney disease, stage 3b: Secondary | ICD-10-CM | POA: Diagnosis not present

## 2021-04-12 DIAGNOSIS — Z823 Family history of stroke: Secondary | ICD-10-CM | POA: Diagnosis not present

## 2021-04-12 DIAGNOSIS — I5023 Acute on chronic systolic (congestive) heart failure: Secondary | ICD-10-CM | POA: Diagnosis present

## 2021-04-12 DIAGNOSIS — K219 Gastro-esophageal reflux disease without esophagitis: Secondary | ICD-10-CM | POA: Diagnosis present

## 2021-04-12 DIAGNOSIS — Z87820 Personal history of traumatic brain injury: Secondary | ICD-10-CM

## 2021-04-12 DIAGNOSIS — E1122 Type 2 diabetes mellitus with diabetic chronic kidney disease: Secondary | ICD-10-CM | POA: Diagnosis present

## 2021-04-12 DIAGNOSIS — I428 Other cardiomyopathies: Secondary | ICD-10-CM | POA: Diagnosis present

## 2021-04-12 DIAGNOSIS — I509 Heart failure, unspecified: Secondary | ICD-10-CM | POA: Diagnosis not present

## 2021-04-12 DIAGNOSIS — Z9049 Acquired absence of other specified parts of digestive tract: Secondary | ICD-10-CM | POA: Diagnosis not present

## 2021-04-12 DIAGNOSIS — E1140 Type 2 diabetes mellitus with diabetic neuropathy, unspecified: Secondary | ICD-10-CM | POA: Diagnosis not present

## 2021-04-12 DIAGNOSIS — E669 Obesity, unspecified: Secondary | ICD-10-CM | POA: Diagnosis present

## 2021-04-12 DIAGNOSIS — Z90710 Acquired absence of both cervix and uterus: Secondary | ICD-10-CM | POA: Diagnosis not present

## 2021-04-12 DIAGNOSIS — I10 Essential (primary) hypertension: Secondary | ICD-10-CM | POA: Diagnosis present

## 2021-04-12 DIAGNOSIS — R0602 Shortness of breath: Secondary | ICD-10-CM | POA: Diagnosis not present

## 2021-04-12 DIAGNOSIS — M1990 Unspecified osteoarthritis, unspecified site: Secondary | ICD-10-CM | POA: Diagnosis present

## 2021-04-12 DIAGNOSIS — G2581 Restless legs syndrome: Secondary | ICD-10-CM | POA: Diagnosis present

## 2021-04-12 DIAGNOSIS — G250 Essential tremor: Secondary | ICD-10-CM | POA: Diagnosis present

## 2021-04-12 DIAGNOSIS — Z79890 Hormone replacement therapy (postmenopausal): Secondary | ICD-10-CM

## 2021-04-12 DIAGNOSIS — Z20822 Contact with and (suspected) exposure to covid-19: Secondary | ICD-10-CM | POA: Diagnosis present

## 2021-04-12 DIAGNOSIS — Z6826 Body mass index (BMI) 26.0-26.9, adult: Secondary | ICD-10-CM

## 2021-04-12 DIAGNOSIS — Z8673 Personal history of transient ischemic attack (TIA), and cerebral infarction without residual deficits: Secondary | ICD-10-CM | POA: Diagnosis not present

## 2021-04-12 DIAGNOSIS — I480 Paroxysmal atrial fibrillation: Secondary | ICD-10-CM | POA: Diagnosis not present

## 2021-04-12 DIAGNOSIS — R0902 Hypoxemia: Secondary | ICD-10-CM | POA: Diagnosis present

## 2021-04-12 DIAGNOSIS — I5043 Acute on chronic combined systolic (congestive) and diastolic (congestive) heart failure: Secondary | ICD-10-CM

## 2021-04-12 DIAGNOSIS — F419 Anxiety disorder, unspecified: Secondary | ICD-10-CM | POA: Diagnosis present

## 2021-04-12 DIAGNOSIS — Z79891 Long term (current) use of opiate analgesic: Secondary | ICD-10-CM

## 2021-04-12 DIAGNOSIS — N183 Chronic kidney disease, stage 3 unspecified: Secondary | ICD-10-CM | POA: Diagnosis present

## 2021-04-12 DIAGNOSIS — J189 Pneumonia, unspecified organism: Secondary | ICD-10-CM | POA: Diagnosis present

## 2021-04-12 DIAGNOSIS — I110 Hypertensive heart disease with heart failure: Secondary | ICD-10-CM | POA: Diagnosis not present

## 2021-04-12 DIAGNOSIS — E785 Hyperlipidemia, unspecified: Secondary | ICD-10-CM | POA: Diagnosis present

## 2021-04-12 DIAGNOSIS — Z8249 Family history of ischemic heart disease and other diseases of the circulatory system: Secondary | ICD-10-CM

## 2021-04-12 DIAGNOSIS — Z7901 Long term (current) use of anticoagulants: Secondary | ICD-10-CM

## 2021-04-12 DIAGNOSIS — Z79899 Other long term (current) drug therapy: Secondary | ICD-10-CM

## 2021-04-12 DIAGNOSIS — E039 Hypothyroidism, unspecified: Secondary | ICD-10-CM | POA: Diagnosis not present

## 2021-04-12 LAB — BASIC METABOLIC PANEL
Anion gap: 6 (ref 5–15)
BUN: 20 mg/dL (ref 8–23)
CO2: 27 mmol/L (ref 22–32)
Calcium: 8.5 mg/dL — ABNORMAL LOW (ref 8.9–10.3)
Chloride: 102 mmol/L (ref 98–111)
Creatinine, Ser: 1.56 mg/dL — ABNORMAL HIGH (ref 0.44–1.00)
GFR, Estimated: 31 mL/min — ABNORMAL LOW (ref >60)
Glucose, Bld: 142 mg/dL — ABNORMAL HIGH (ref 70–99)
Potassium: 4.1 mmol/L (ref 3.5–5.1)
Sodium: 135 mmol/L (ref 135–145)

## 2021-04-12 LAB — CBC WITH DIFFERENTIAL/PLATELET
Abs Immature Granulocytes: 0.09 10*3/uL — ABNORMAL HIGH (ref 0.00–0.07)
Basophils Absolute: 0.1 10*3/uL (ref 0.0–0.1)
Basophils Relative: 1 %
Eosinophils Absolute: 0.3 10*3/uL (ref 0.0–0.5)
Eosinophils Relative: 4 %
HCT: 38.5 % (ref 36.0–46.0)
Hemoglobin: 11.9 g/dL — ABNORMAL LOW (ref 12.0–15.0)
Immature Granulocytes: 1 %
Lymphocytes Relative: 26 %
Lymphs Abs: 1.8 10*3/uL (ref 0.7–4.0)
MCH: 27.9 pg (ref 26.0–34.0)
MCHC: 30.9 g/dL (ref 30.0–36.0)
MCV: 90.4 fL (ref 80.0–100.0)
Monocytes Absolute: 0.6 10*3/uL (ref 0.1–1.0)
Monocytes Relative: 8 %
Neutro Abs: 4.3 10*3/uL (ref 1.7–7.7)
Neutrophils Relative %: 60 %
Platelets: 209 10*3/uL (ref 150–400)
RBC: 4.26 MIL/uL (ref 3.87–5.11)
RDW: 14.3 % (ref 11.5–15.5)
WBC: 7 10*3/uL (ref 4.0–10.5)
nRBC: 0 % (ref 0.0–0.2)

## 2021-04-12 LAB — RESP PANEL BY RT-PCR (FLU A&B, COVID) ARPGX2
Influenza A by PCR: NEGATIVE
Influenza B by PCR: NEGATIVE
SARS Coronavirus 2 by RT PCR: NEGATIVE

## 2021-04-12 LAB — I-STAT ARTERIAL BLOOD GAS, ED
Acid-base deficit: 1 mmol/L (ref 0.0–2.0)
Bicarbonate: 24.6 mmol/L (ref 20.0–28.0)
Calcium, Ion: 1.25 mmol/L (ref 1.15–1.40)
HCT: 34 % — ABNORMAL LOW (ref 36.0–46.0)
Hemoglobin: 11.6 g/dL — ABNORMAL LOW (ref 12.0–15.0)
O2 Saturation: 99 %
Patient temperature: 97.2
Potassium: 4 mmol/L (ref 3.5–5.1)
Sodium: 137 mmol/L (ref 135–145)
TCO2: 26 mmol/L (ref 22–32)
pCO2 arterial: 44.6 mmHg (ref 32.0–48.0)
pH, Arterial: 7.346 — ABNORMAL LOW (ref 7.350–7.450)
pO2, Arterial: 153 mmHg — ABNORMAL HIGH (ref 83.0–108.0)

## 2021-04-12 LAB — TROPONIN I (HIGH SENSITIVITY)
Troponin I (High Sensitivity): 5 ng/L (ref <18)
Troponin I (High Sensitivity): 5 ng/L (ref <18)

## 2021-04-12 LAB — BRAIN NATRIURETIC PEPTIDE: B Natriuretic Peptide: 443 pg/mL — ABNORMAL HIGH (ref 0.0–100.0)

## 2021-04-12 LAB — GLUCOSE, CAPILLARY: Glucose-Capillary: 149 mg/dL — ABNORMAL HIGH (ref 70–99)

## 2021-04-12 LAB — LACTIC ACID, PLASMA: Lactic Acid, Venous: 1.2 mmol/L (ref 0.5–1.9)

## 2021-04-12 MED ORDER — METOPROLOL TARTRATE 50 MG PO TABS
50.0000 mg | ORAL_TABLET | Freq: Two times a day (BID) | ORAL | Status: DC
  Administered 2021-04-12 – 2021-04-16 (×8): 50 mg via ORAL
  Filled 2021-04-12 (×8): qty 1

## 2021-04-12 MED ORDER — ATORVASTATIN CALCIUM 40 MG PO TABS
40.0000 mg | ORAL_TABLET | Freq: Every day | ORAL | Status: DC
  Administered 2021-04-13 – 2021-04-16 (×4): 40 mg via ORAL
  Filled 2021-04-12 (×4): qty 1

## 2021-04-12 MED ORDER — PRAMIPEXOLE DIHYDROCHLORIDE 0.25 MG PO TABS
0.7500 mg | ORAL_TABLET | Freq: Every day | ORAL | Status: DC
  Administered 2021-04-13 – 2021-04-15 (×3): 0.75 mg via ORAL
  Filled 2021-04-12 (×5): qty 3

## 2021-04-12 MED ORDER — INSULIN ASPART 100 UNIT/ML IJ SOLN
0.0000 [IU] | Freq: Three times a day (TID) | INTRAMUSCULAR | Status: DC
  Administered 2021-04-13: 3 [IU] via SUBCUTANEOUS
  Administered 2021-04-14 – 2021-04-15 (×3): 2 [IU] via SUBCUTANEOUS
  Administered 2021-04-15: 3 [IU] via SUBCUTANEOUS
  Administered 2021-04-15: 2 [IU] via SUBCUTANEOUS
  Administered 2021-04-16 (×3): 3 [IU] via SUBCUTANEOUS

## 2021-04-12 MED ORDER — LEVETIRACETAM 250 MG PO TABS
250.0000 mg | ORAL_TABLET | Freq: Every day | ORAL | Status: DC
  Administered 2021-04-13 – 2021-04-15 (×3): 250 mg via ORAL
  Filled 2021-04-12 (×5): qty 1

## 2021-04-12 MED ORDER — INSULIN GLARGINE 100 UNIT/ML ~~LOC~~ SOLN
15.0000 [IU] | Freq: Every day | SUBCUTANEOUS | Status: DC
  Administered 2021-04-13 – 2021-04-16 (×4): 15 [IU] via SUBCUTANEOUS
  Filled 2021-04-12 (×4): qty 0.15

## 2021-04-12 MED ORDER — LEVOTHYROXINE SODIUM 50 MCG PO TABS
50.0000 ug | ORAL_TABLET | Freq: Every day | ORAL | Status: DC
  Administered 2021-04-13 – 2021-04-16 (×4): 50 ug via ORAL
  Filled 2021-04-12 (×5): qty 1

## 2021-04-12 MED ORDER — PREGABALIN 25 MG PO CAPS
50.0000 mg | ORAL_CAPSULE | Freq: Every day | ORAL | Status: DC
  Administered 2021-04-13 – 2021-04-15 (×3): 50 mg via ORAL
  Filled 2021-04-12 (×3): qty 2

## 2021-04-12 MED ORDER — PRAMIPEXOLE DIHYDROCHLORIDE 0.25 MG PO TABS
0.2500 mg | ORAL_TABLET | Freq: Every day | ORAL | Status: DC
  Administered 2021-04-13 – 2021-04-16 (×4): 0.25 mg via ORAL
  Filled 2021-04-12 (×4): qty 1

## 2021-04-12 MED ORDER — FUROSEMIDE 10 MG/ML IJ SOLN
40.0000 mg | Freq: Two times a day (BID) | INTRAMUSCULAR | Status: DC
  Administered 2021-04-13 – 2021-04-15 (×5): 40 mg via INTRAVENOUS
  Filled 2021-04-12 (×5): qty 4

## 2021-04-12 MED ORDER — REVEFENACIN 175 MCG/3ML IN SOLN
175.0000 ug | Freq: Every day | RESPIRATORY_TRACT | Status: DC
  Filled 2021-04-12 (×2): qty 3

## 2021-04-12 MED ORDER — FLUOXETINE HCL 20 MG PO CAPS
40.0000 mg | ORAL_CAPSULE | Freq: Every day | ORAL | Status: DC
  Administered 2021-04-13 – 2021-04-16 (×4): 40 mg via ORAL
  Filled 2021-04-12 (×4): qty 2

## 2021-04-12 MED ORDER — ACETAMINOPHEN 325 MG PO TABS
650.0000 mg | ORAL_TABLET | Freq: Four times a day (QID) | ORAL | Status: DC | PRN
Start: 2021-04-12 — End: 2021-04-16

## 2021-04-12 MED ORDER — CEFDINIR 300 MG PO CAPS
300.0000 mg | ORAL_CAPSULE | Freq: Two times a day (BID) | ORAL | Status: DC
  Filled 2021-04-12: qty 1

## 2021-04-12 MED ORDER — CEFDINIR 300 MG PO CAPS
300.0000 mg | ORAL_CAPSULE | Freq: Every day | ORAL | Status: AC
  Administered 2021-04-13: 300 mg via ORAL
  Filled 2021-04-12 (×2): qty 1

## 2021-04-12 MED ORDER — FUROSEMIDE 10 MG/ML IJ SOLN
40.0000 mg | Freq: Once | INTRAMUSCULAR | Status: AC
  Administered 2021-04-12: 40 mg via INTRAVENOUS
  Filled 2021-04-12: qty 4

## 2021-04-12 MED ORDER — APIXABAN 2.5 MG PO TABS
2.5000 mg | ORAL_TABLET | Freq: Two times a day (BID) | ORAL | Status: DC
  Administered 2021-04-12 – 2021-04-16 (×8): 2.5 mg via ORAL
  Filled 2021-04-12 (×9): qty 1

## 2021-04-12 MED ORDER — TRAZODONE HCL 100 MG PO TABS
100.0000 mg | ORAL_TABLET | Freq: Every day | ORAL | Status: DC
  Administered 2021-04-13 – 2021-04-15 (×3): 100 mg via ORAL
  Filled 2021-04-12 (×3): qty 1

## 2021-04-12 MED ORDER — PANTOPRAZOLE SODIUM 40 MG PO TBEC
40.0000 mg | DELAYED_RELEASE_TABLET | Freq: Two times a day (BID) | ORAL | Status: DC
  Administered 2021-04-12 – 2021-04-16 (×8): 40 mg via ORAL
  Filled 2021-04-12 (×8): qty 1

## 2021-04-12 MED ORDER — ACETAMINOPHEN 650 MG RE SUPP
650.0000 mg | Freq: Four times a day (QID) | RECTAL | Status: DC | PRN
Start: 2021-04-12 — End: 2021-04-16

## 2021-04-12 MED ORDER — SODIUM CHLORIDE 0.9% FLUSH
3.0000 mL | Freq: Two times a day (BID) | INTRAVENOUS | Status: DC
  Administered 2021-04-13 – 2021-04-16 (×8): 3 mL via INTRAVENOUS

## 2021-04-12 MED ORDER — POLYETHYLENE GLYCOL 3350 17 G PO PACK: 17.0000 g | PACK | Freq: Every day | ORAL | Status: DC | PRN

## 2021-04-12 MED ORDER — ARFORMOTEROL TARTRATE 15 MCG/2ML IN NEBU
15.0000 ug | INHALATION_SOLUTION | Freq: Two times a day (BID) | RESPIRATORY_TRACT | Status: DC
  Administered 2021-04-12 – 2021-04-16 (×8): 15 ug via RESPIRATORY_TRACT
  Filled 2021-04-12 (×8): qty 2

## 2021-04-12 MED ORDER — SUCRALFATE 1 GM/10ML PO SUSP
1.0000 g | Freq: Three times a day (TID) | ORAL | Status: DC
  Administered 2021-04-12 – 2021-04-16 (×16): 1 g via ORAL
  Filled 2021-04-12 (×16): qty 10

## 2021-04-12 NOTE — ED Triage Notes (Signed)
C/o SOb , lethargic , recent d/c from hospital x 2 days ago
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
## 2021-04-12 NOTE — ED Notes (Signed)
Report given to [facility]. 

## 2021-04-12 NOTE — ED Notes (Signed)
Pt transferred to [facility] via [facility] 

## 2021-04-12 NOTE — Telephone Encounter (Signed)
Transition Care Management Follow-up Telephone Call Date of discharge and from where: 04/10/2021 How have you been since you were released from the hospital? Doing pretty good per daughter Any questions or concerns? No  Items Reviewed: Did the pt receive and understand the discharge instructions provided? Yes  Medications obtained and verified? Yes  Other? Yes  Any new allergies since your discharge? No  Dietary orders reviewed? Yes Do you have support at home? Yes   Home Care and Equipment/Supplies: Were home health services ordered? yes If so, what is the name of the agency? [facility]  Has the agency set up a time to come to the patient's home? no Were any new equipment or medical supplies ordered?  No What is the name of the medical supply agency? N/a Were you able to get the supplies/equipment? not applicable   Functional Questionnaire: (I = Independent and D = Dependent) ADLs: I with assistance  Bathing/Dressing- I with assistance  Meal Prep- I with assistance  Eating- I  Maintaining continence- I  Transferring/Ambulation- I with assistance  Managing Meds- I with assistance  Follow up appointments reviewed:  PCP Hospital f/u appt confirmed? Yes  Scheduled to see Jodi Mourning on 04/20/21 @ 1:40. [facility] f/u appt confirmed? Yes  Scheduled to see Dr. Kathlen Mody on 04/17/21 @ 12:15. Are transportation arrangements needed? No  If their condition worsens, is the pt aware to call PCP or go to the Emergency Dept.? Yes Was the patient provided with contact information for the PCP's office or ED? Yes Was to pt encouraged to call back with questions or concerns? Yes

## 2021-04-12 NOTE — ED Notes (Signed)
ED Provider at bedside. 

## 2021-04-13 ENCOUNTER — Inpatient Hospital Stay (HOSPITAL_COMMUNITY): Payer: Medicare Other

## 2021-04-13 DIAGNOSIS — I509 Heart failure, unspecified: Secondary | ICD-10-CM

## 2021-04-13 DIAGNOSIS — I5023 Acute on chronic systolic (congestive) heart failure: Secondary | ICD-10-CM

## 2021-04-13 LAB — CBC
HCT: 37.7 % (ref 36.0–46.0)
Hemoglobin: 11.7 g/dL — ABNORMAL LOW (ref 12.0–15.0)
MCH: 27.9 pg (ref 26.0–34.0)
MCHC: 31 g/dL (ref 30.0–36.0)
MCV: 89.8 fL (ref 80.0–100.0)
Platelets: 203 10*3/uL (ref 150–400)
RBC: 4.2 MIL/uL (ref 3.87–5.11)
RDW: 14.2 % (ref 11.5–15.5)
WBC: 7.2 10*3/uL (ref 4.0–10.5)
nRBC: 0 % (ref 0.0–0.2)

## 2021-04-13 LAB — GLUCOSE, CAPILLARY
Glucose-Capillary: 163 mg/dL — ABNORMAL HIGH (ref 70–99)
Glucose-Capillary: 109 mg/dL — ABNORMAL HIGH (ref 70–99)
Glucose-Capillary: 110 mg/dL — ABNORMAL HIGH (ref 70–99)
Glucose-Capillary: 146 mg/dL — ABNORMAL HIGH (ref 70–99)

## 2021-04-13 LAB — BASIC METABOLIC PANEL
Anion gap: 9 (ref 5–15)
BUN: 18 mg/dL (ref 8–23)
CO2: 29 mmol/L (ref 22–32)
Calcium: 8.8 mg/dL — ABNORMAL LOW (ref 8.9–10.3)
Chloride: 99 mmol/L (ref 98–111)
Creatinine, Ser: 1.51 mg/dL — ABNORMAL HIGH (ref 0.44–1.00)
GFR, Estimated: 32 mL/min — ABNORMAL LOW (ref >60)
Glucose, Bld: 147 mg/dL — ABNORMAL HIGH (ref 70–99)
Potassium: 4.3 mmol/L (ref 3.5–5.1)
Sodium: 137 mmol/L (ref 135–145)

## 2021-04-13 LAB — ECHOCARDIOGRAM COMPLETE
AR max vel: 1.81 cm2
AV Area VTI: 1.54 cm2
AV Area mean vel: 1.56 cm2
AV Mean grad: 3 mmHg
AV Peak grad: 3.9 mmHg
Ao pk vel: 0.99 m/s
Area-P 1/2: 3.43 cm2
Height: 63.5 in
MV VTI: 1.14 cm2
S' Lateral: 4.4 cm
Weight: 2560 oz

## 2021-04-13 LAB — MAGNESIUM: Magnesium: 1.6 mg/dL — ABNORMAL LOW (ref 1.7–2.4)

## 2021-04-13 MED ORDER — REVEFENACIN 175 MCG/3ML IN SOLN
175.0000 ug | Freq: Every day | RESPIRATORY_TRACT | Status: DC
  Administered 2021-04-13 – 2021-04-16 (×4): 175 ug via RESPIRATORY_TRACT
  Filled 2021-04-13 (×4): qty 3

## 2021-04-13 MED ORDER — REVEFENACIN 175 MCG/3ML IN SOLN: 175.0000 ug | Freq: Every day | RESPIRATORY_TRACT | Status: DC

## 2021-04-13 NOTE — Progress Notes (Signed)
PROGRESS NOTE    Melissa [name]  GEX:[id number] DOB: 02-12-1928 DOA: 04/12/2021 PCP: Darreld Mclean, MD    Brief Narrative:  87 y.o. female with medical history significant of A. fib, CHF, diabetes, CKD 3B, hypothyroidism, hypertension, hyperlipidemia, CVA, anemia, RLS, COPD who presents with weight gain and shortness of breath.  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 > BNP elevated to 444 in the ED and did have edema however no significant changes on chest x-ray.  Did have desaturation to the 80s requiring 2 L to maintain saturations. - Continue with Lasix 40 mg IV twice daily - Daily weights, strict I's and O's - Trend renal function electrolytes -Edema improving, however pt remains grossly volume overloaded   Atrial fibrillation - Continue home metoprolol and Eliquis   Recent pneumonia - Continue cefdinir   Diabetes - continued on 15 units lantus daily with SSI   CKD 3B > Creatinine stable in the ED at 1.56 -Avoid nephrotoxic agents -recheck bmet in AM   Hypothyroidism - Continue home Synthroid  Hypertension - Continue home metoprolol - Also receiving Lasix as above   Hyperlipidemia History of CVA -Continue home atorvastatin   RLS - Continue home pramipexole and Keppra   Obstructive lung disease - Continue home Yupelri - Replace home formoterol with formulary  arformoterol    DVT prophylaxis: eliquis Code Status: Full Family Communication: Pt in room, family at bedside  Status is: Inpatient  Remains inpatient appropriate because:Inpatient level of care appropriate due to severity of illness  Dispo: The patient is from: Home              Anticipated d/c is to: Home              Patient currently is not medically stable to d/c.   Difficult to place patient No  Consultants:    Procedures:    Antimicrobials: Anti-infectives (From admission, onward)    Start     Dose/Rate Route Frequency Ordered Stop   04/13/21 1000  cefdinir (OMNICEF) capsule 300 mg        300 mg Oral Daily 04/12/21 2116 04/13/21 0856   04/12/21 2200  cefdinir (OMNICEF) capsule 300 mg  Status:  Discontinued        300 mg Oral 2 times daily 04/12/21 2054 04/12/21 2115       Subjective: Feeling better today  Objective: Vitals:   04/13/21 0723 04/13/21 0743 04/13/21 1100 04/13/21 1607  BP:  124/60 (!) 114/58 126/64  Pulse:  97 79 81  Resp:  15 15 17   Temp:  98 F (36.7 C) (!) 97.4 F (36.3 C) 98.1 F (36.7 C)  TempSrc:  Oral Oral Oral  SpO2: 98% 98% 99% 93%  Weight:      Height:        Intake/Output Summary (Last 24 hours) at 04/13/2021 1618 Last data filed at 04/13/2021 1100 Gross per 24 hour  Intake 125 ml  Output 3050 ml  Net -2925 ml   Filed Weights   04/13/21 0257  Weight: 72.6 kg    Examination: General exam: Awake, laying in bed, in nad Respiratory system: Normal respiratory effort, no wheezing Cardiovascular system: regular rate, s1, s2 Gastrointestinal system: Soft, nondistended, positive BS Central nervous system: CN2-12 grossly intact, strength intact Extremities: Perfused, no clubbing, BLE edema Skin: Normal skin turgor, no notable skin lesions seen Psychiatry: Mood normal // no visual hallucinations   Data Reviewed: I have personally reviewed following labs and imaging studies  CBC: Recent Labs  Lab 04/07/21 0303  04/08/21 0337 04/12/21 1413 04/12/21 1419 04/13/21 0229  WBC 6.9 10.3 7.0  --  7.2  NEUTROABS  --  8.1* 4.3  --   --   HGB 11.7* 12.2 11.9* 11.6* 11.7*  HCT 38.2 40.3 38.5 34.0* 37.7  MCV 90.7 93.1 90.4  --  89.8  PLT 157 155 209  --  546   Basic Metabolic Panel: Recent Labs  Lab 04/08/21 0337 04/09/21 0349 04/10/21 0342 04/12/21 1413 04/12/21 1419 04/13/21 0229  NA 138 136 137 135 137 137  K 4.3 3.7 3.8 4.1 4.0 4.3  CL 107 106 103 102  --  99  CO2 24 23 27 27   --  29  GLUCOSE 111* 62* 121* 142*  --  147*  BUN 33* 27* 26* 20  --  18  CREATININE 1.55* 1.37* 1.52* 1.56*  --  1.51*  CALCIUM 8.6* 8.8* 8.7* 8.5*  --  8.8*  MG 1.6* 2.0  --   --   --  1.6*   GFR: Estimated Creatinine Clearance: 23.4 mL/min (A) (by C-G formula based on SCr of 1.51 mg/dL (H)). Liver Function Tests: No results for input(s): AST, ALT, ALKPHOS, BILITOT, PROT, ALBUMIN in the last 168 hours. No results for input(s): LIPASE, AMYLASE in the last 168 hours. No results for input(s): AMMONIA in the last 168 hours. Coagulation Profile: No results for input(s): INR, PROTIME in the last 168 hours. Cardiac Enzymes: No results for input(s): CKTOTAL, CKMB, CKMBINDEX, TROPONINI in the last 168 hours. BNP (last 3 results) No results for input(s): PROBNP in the last 8760 hours. HbA1C: No results for input(s): HGBA1C in the last 72 hours. CBG: Recent Labs  Lab 04/10/21 1122 04/12/21 2356 04/13/21 0741 04/13/21 1121 04/13/21 1609  GLUCAP 149* 149* 163* 109* 110*   Lipid Profile: No results for input(s): CHOL, HDL, LDLCALC, TRIG, CHOLHDL, LDLDIRECT in the last 72 hours. Thyroid Function Tests: No results for input(s): TSH, T4TOTAL, FREET4, T3FREE, THYROIDAB in the last 72 hours. Anemia Panel: No results for input(s): VITAMINB12, FOLATE, FERRITIN, TIBC, IRON, RETICCTPCT in the last 72 hours. Sepsis Labs: Recent Labs  Lab 04/08/21 0337 04/12/21 1413  PROCALCITON <0.10  --   LATICACIDVEN  --  1.2     Recent Results (from the past 240 hour(s))  Resp Panel by RT-PCR (Flu A&B, Covid) Nasopharyngeal Swab     Status: None   Collection Time: 04/06/21 11:29 AM   Specimen: Nasopharyngeal Swab; Nasopharyngeal(NP) swabs in vial transport medium  Result Value Ref Range Status   SARS Coronavirus 2 by RT PCR NEGATIVE NEGATIVE Final    Comment: (NOTE) SARS-CoV-2 target nucleic acids are NOT DETECTED.  The SARS-CoV-2 RNA is generally detectable in upper respiratory specimens during the acute phase of infection. The lowest concentration of SARS-CoV-2 viral copies this assay can detect is 138 copies/mL. A negative result does not preclude SARS-Cov-2 infection  and should not be used as the sole basis for treatment or other patient management decisions. A negative result may occur with  improper specimen collection/handling, submission of specimen other than nasopharyngeal swab, presence of viral mutation(s) within the areas targeted by this assay, and inadequate number of viral copies(<138 copies/mL). A negative result must be combined with clinical observations, patient history, and epidemiological information. The expected result is Negative.  Fact Sheet for Patients:  EntrepreneurPulse.com.au  Fact Sheet for Healthcare Providers:  IncredibleEmployment.be  This test is no t yet approved or cleared by the [place] FDA and  has been authorized for detection and/or diagnosis of SARS-CoV-2 by FDA under an Emergency Use Authorization (EUA). This EUA will remain  in effect (meaning this test can be used) for the duration of the COVID-19 declaration under Section 564(b)(1) of the Act, 21 U.S.C.section 360bbb-3(b)(1), unless the authorization is terminated  or revoked sooner.       Influenza A by PCR NEGATIVE NEGATIVE Final   Influenza B by PCR NEGATIVE NEGATIVE Final    Comment: (NOTE) The Xpert Xpress SARS-CoV-2/FLU/RSV plus assay is intended as an  aid in the diagnosis of influenza from Nasopharyngeal swab specimens and should not be used as a sole basis for treatment. Nasal washings and aspirates are unacceptable for Xpert Xpress SARS-CoV-2/FLU/RSV testing.  Fact Sheet for Patients: EntrepreneurPulse.com.au  Fact Sheet for Healthcare Providers: IncredibleEmployment.be  This test is not yet approved or cleared by the [place] FDA and has been authorized for detection and/or diagnosis of SARS-CoV-2 by FDA under an Emergency Use Authorization (EUA). This EUA will remain in effect (meaning this test can be used) for the duration of the COVID-19 declaration under Section 564(b)(1) of the Act, 21 U.S.C. section 360bbb-3(b)(1), unless the authorization is terminated or revoked.  Performed at [facility], [address] [place], [place] [address]   Blood culture (routine x 2)     Status: None   Collection Time: 04/06/21 11:39 AM   Specimen: BLOOD  Result Value Ref Range Status   Specimen Description   Final    BLOOD LEFT ANTECUBITAL Performed at [facility], [address] [place], [place] [address]    Special Requests   Final    NONE Performed at [facility], [address] [place], [place] [address]    Culture   Final    NO GROWTH 5 DAYS Performed at [facility], [place] [address], [place], [place] [address]    Report Status 04/11/2021 FINAL  Final  Blood culture (routine x 2)     Status: None   Collection Time: 04/06/21 11:54 AM   Specimen: BLOOD  Result Value Ref Range Status   Specimen Description   Final    BLOOD RIGHT WRIST Performed at [facility], [address] [place], [place] [address]    Special Requests   Final    BOTTLES DRAWN AEROBIC AND ANAEROBIC Blood Culture adequate volume Performed at [facility], [address] [place], [place] [address]    Culture   Final    NO GROWTH 5 DAYS Performed  at [facility], [place] [address], [place], [place] [address]    Report Status 04/11/2021 FINAL  Final  MRSA PCR Screening     Status: None   Collection Time: 04/06/21  5:27 PM  Result Value Ref Range Status   MRSA by PCR NEGATIVE NEGATIVE  Final    Comment:        The GeneXpert MRSA Assay (FDA approved for NASAL specimens only), is one component of a comprehensive MRSA colonization surveillance program. It is not intended to diagnose MRSA infection nor to guide or monitor treatment for MRSA infections. Performed at [facility], [place] [address], [place], [place] [address]   Resp Panel by RT-PCR (Flu A&B, Covid) Nasopharyngeal Swab     Status: None   Collection Time: 04/12/21  3:15 PM   Specimen: Nasopharyngeal Swab; Nasopharyngeal(NP) swabs in vial transport medium  Result Value Ref Range Status   SARS Coronavirus 2 by RT PCR NEGATIVE NEGATIVE Final    Comment: (NOTE) SARS-CoV-2 target nucleic acids are NOT DETECTED.  The SARS-CoV-2 RNA is generally detectable in upper respiratory specimens during the acute phase of infection. The lowest concentration of SARS-CoV-2 viral copies this assay can detect is 138 copies/mL. A negative result does not preclude SARS-Cov-2 infection and should not be used as the sole basis for treatment or other patient management decisions. A negative result may occur with  improper specimen collection/handling, submission of specimen other than nasopharyngeal swab, presence of viral mutation(s) within the areas targeted by this assay, and inadequate number of viral copies(<138 copies/mL). A negative result must be combined with clinical observations, patient history, and epidemiological information. The expected result is Negative.  Fact Sheet for Patients:  EntrepreneurPulse.com.au  Fact Sheet for Healthcare Providers:  IncredibleEmployment.be  This test is no t yet approved or cleared by  the [place] FDA and  has been authorized for detection and/or diagnosis of SARS-CoV-2 by FDA under an Emergency Use Authorization (EUA). This EUA will remain  in effect (meaning this test can be used) for the duration of the COVID-19 declaration under Section 564(b)(1) of the Act, 21 U.S.C.section 360bbb-3(b)(1), unless the authorization is terminated  or revoked sooner.       Influenza A by PCR NEGATIVE NEGATIVE Final   Influenza B by PCR NEGATIVE NEGATIVE Final    Comment: (NOTE) The Xpert Xpress SARS-CoV-2/FLU/RSV plus assay is intended as an aid in the diagnosis of influenza from Nasopharyngeal swab specimens and should not be used as a sole basis for treatment. Nasal washings and aspirates are unacceptable for Xpert Xpress SARS-CoV-2/FLU/RSV testing.  Fact Sheet for Patients: EntrepreneurPulse.com.au  Fact Sheet for Healthcare Providers: IncredibleEmployment.be  This test is not yet approved or cleared by the [place] FDA and has been authorized for detection and/or diagnosis of SARS-CoV-2 by FDA under an Emergency Use Authorization (EUA). This EUA will remain in effect (meaning this test can be used) for the duration of the COVID-19 declaration under Section 564(b)(1) of the Act, 21 U.S.C. section 360bbb-3(b)(1), unless the authorization is terminated or revoked.  Performed at [facility], [address] [place], [place] [address]      Radiology Studies: DG Chest [place] 1 View  Result Date: 04/12/2021 CLINICAL DATA:  Shortness of breath EXAM: PORTABLE CHEST 1 VIEW COMPARISON:  01/07/2021 FINDINGS: The heart size and mediastinal contours are within normal limits. Chronic interstitial changes. No new consolidation or edema. No pleural effusion or pneumothorax. No acute osseous abnormality. IMPRESSION: No acute process in the chest. Electronically Signed   By: Macy Mis M.D.   On: 04/12/2021 14:57    ECHOCARDIOGRAM COMPLETE  Result Date: 04/13/2021    ECHOCARDIOGRAM REPORT   Patient Name:   [name] Date of Exam: 04/13/2021 Medical Rec #:  [id number]  Height:       63.5 in Accession #:    [id number]    Weight:       160.0 lb Date of Birth:  [birth year]     BSA:          1.769 m Patient Age:    68 years      BP:           100/82 mmHg Patient Gender: F             HR:           82 bpm. Exam Location:  Inpatient Procedure: 2D Echo, Cardiac Doppler and Color Doppler Indications:    CHF  History:        Patient has prior history of Echocardiogram examinations, most                 recent 07/16/2019. Arrythmias:Atrial Fibrillation; Risk                 Factors:Hypertension and Diabetes.  Sonographer:    Cammy Brochure Referring Phys: [id number] [place]  1. Left ventricular ejection fraction, by estimation, is 20 to 25%. The left ventricle has severely decreased function. The left ventricle demonstrates global hypokinesis. Left ventricular diastolic function could not be evaluated.  2. Right ventricular systolic function is normal. The right ventricular size is normal.  3. Left atrial size was mildly dilated.  4. The mitral valve is grossly normal. Mild to moderate mitral valve regurgitation.  5. Tricuspid valve regurgitation is mild to moderate.  6. The aortic valve is normal in structure. Aortic valve regurgitation is not visualized. FINDINGS  Left Ventricle: Left ventricular ejection fraction, by estimation, is 20 to 25%. The left ventricle has severely decreased function. The left ventricle demonstrates global hypokinesis. The left ventricular internal cavity size was normal in size. There is no left ventricular hypertrophy. Left ventricular diastolic function could not be evaluated due to atrial fibrillation. Left ventricular diastolic function could not be evaluated. Right Ventricle: The right ventricular size is normal. Right vetricular wall thickness was not well visualized. Right  ventricular systolic function is normal. Left Atrium: Left atrial size was mildly dilated. Right Atrium: Right atrial size was normal in size. Pericardium: There is no evidence of pericardial effusion. Mitral Valve: The mitral valve is grossly normal. Mild to moderate mitral annular calcification. Mild to moderate mitral valve regurgitation. MV peak gradient, 9.4 mmHg. The mean mitral valve gradient is 3.0 mmHg. Tricuspid Valve: The tricuspid valve is grossly normal. Tricuspid valve regurgitation is mild to moderate. Aortic Valve: The aortic valve is normal in structure. Aortic valve regurgitation is not visualized. Aortic valve mean gradient measures 3.0 mmHg. Aortic valve peak gradient measures 3.9 mmHg. Aortic valve area, by VTI measures 1.54 cm. Pulmonic Valve: The pulmonic valve was grossly normal. Pulmonic valve regurgitation is not visualized. Aorta: The aortic root and ascending aorta are structurally normal, with no evidence of dilitation. IAS/Shunts: The atrial septum is grossly normal.  LEFT VENTRICLE PLAX 2D LVIDd:         5.15 cm LVIDs:         4.40 cm LV PW:         1.20 cm LV IVS:        1.15 cm LVOT diam:     2.10 cm LV SV:         33 LV SV Index:   19 LVOT Area:     3.46 cm  RIGHT  VENTRICLE          IVC RV Basal diam:  3.20 cm  IVC diam: 1.90 cm LEFT ATRIUM             Index       RIGHT ATRIUM           Index LA diam:        3.70 cm 2.09 cm/m  RA Area:     17.20 cm LA Vol (A2C):   59.4 ml 33.58 ml/m RA Volume:   45.10 ml  25.49 ml/m LA Vol (A4C):   71.6 ml 40.47 ml/m LA Biplane Vol: 64.9 ml 36.69 ml/m  AORTIC VALVE AV Area (Vmax):    1.81 cm AV Area (Vmean):   1.56 cm AV Area (VTI):     1.54 cm AV Vmax:           98.80 cm/s AV Vmean:          76.400 cm/s AV VTI:            0.214 m AV Peak Grad:      3.9 mmHg AV Mean Grad:      3.0 mmHg LVOT Vmax:         51.60 cm/s LVOT Vmean:        34.400 cm/s LVOT VTI:          0.095 m LVOT/AV VTI ratio: 0.45  AORTA Ao Root diam: 2.90 cm Ao Asc diam:   3.00 cm MITRAL VALVE                TRICUSPID VALVE MV Area (PHT): 3.43 cm     TR Peak grad:   31.1 mmHg MV Area VTI:   1.14 cm     TR Vmax:        279.00 cm/s MV Peak grad:  9.4 mmHg MV Mean grad:  3.0 mmHg     SHUNTS MV Vmax:       1.53 m/s     Systemic VTI:  0.10 m MV Vmean:      73.4 cm/s    Systemic Diam: 2.10 cm MV Decel Time: 221 msec MV E velocity: 140.00 cm/s Mertie Moores MD Electronically signed by Mertie Moores MD Signature Date/Time: 04/13/2021/3:39:35 PM    Final     Scheduled Meds:  apixaban  2.5 mg Oral BID   arformoterol  15 mcg Nebulization Q12H   atorvastatin  40 mg Oral Daily   FLUoxetine  40 mg Oral Daily   furosemide  40 mg Intravenous BID   insulin aspart  0-15 Units Subcutaneous TID WC   insulin glargine  15 Units Subcutaneous Daily   levETIRAcetam  250 mg Oral QHS   levothyroxine  50 mcg Oral Q0600   metoprolol tartrate  50 mg Oral BID   pantoprazole  40 mg Oral BID   pramipexole  0.25 mg Oral Q0600   pramipexole  0.75 mg Oral QHS   pregabalin  50 mg Oral QHS   revefenacin  175 mcg Nebulization Daily   sodium chloride flush  3 mL Intravenous Q12H   sucralfate  1 g Oral TID WC & HS   traZODone  100 mg Oral QHS   Continuous Infusions:   LOS: 1 day   Marylu Lund, MD [facility] Pager On Amion  If 7PM-7AM, please contact night-coverage 04/13/2021, 4:18 PM

## 2021-04-14 LAB — COMPREHENSIVE METABOLIC PANEL
ALT: 15 U/L (ref 0–44)
AST: 11 U/L — ABNORMAL LOW (ref 15–41)
Albumin: 3.1 g/dL — ABNORMAL LOW (ref 3.5–5.0)
Alkaline Phosphatase: 61 U/L (ref 38–126)
Anion gap: 9 (ref 5–15)
BUN: 19 mg/dL (ref 8–23)
CO2: 34 mmol/L — ABNORMAL HIGH (ref 22–32)
Calcium: 8.7 mg/dL — ABNORMAL LOW (ref 8.9–10.3)
Chloride: 95 mmol/L — ABNORMAL LOW (ref 98–111)
Creatinine, Ser: 1.49 mg/dL — ABNORMAL HIGH (ref 0.44–1.00)
GFR, Estimated: 33 mL/min — ABNORMAL LOW (ref >60)
Glucose, Bld: 98 mg/dL (ref 70–99)
Potassium: 3.1 mmol/L — ABNORMAL LOW (ref 3.5–5.1)
Sodium: 138 mmol/L (ref 135–145)
Total Bilirubin: 0.5 mg/dL (ref 0.3–1.2)
Total Protein: 6.4 g/dL — ABNORMAL LOW (ref 6.5–8.1)

## 2021-04-14 LAB — GLUCOSE, CAPILLARY
Glucose-Capillary: 110 mg/dL — ABNORMAL HIGH (ref 70–99)
Glucose-Capillary: 134 mg/dL — ABNORMAL HIGH (ref 70–99)
Glucose-Capillary: 192 mg/dL — ABNORMAL HIGH (ref 70–99)

## 2021-04-14 MED ORDER — POTASSIUM CHLORIDE 20 MEQ PO PACK
40.0000 meq | PACK | Freq: Two times a day (BID) | ORAL | Status: AC
  Administered 2021-04-14 (×2): 40 meq via ORAL
  Filled 2021-04-14 (×2): qty 2

## 2021-04-14 MED ORDER — POTASSIUM CHLORIDE CRYS ER 20 MEQ PO TBCR
40.0000 meq | EXTENDED_RELEASE_TABLET | Freq: Once | ORAL | Status: DC
  Filled 2021-04-14: qty 2

## 2021-04-14 NOTE — Plan of Care (Signed)

## 2021-04-14 NOTE — Progress Notes (Signed)
PROGRESS NOTE    Melissa [name]  LZJ:[id number] DOB: 1928-06-23 DOA: 04/12/2021 PCP: Darreld Mclean, MD    Brief Narrative:  87 y.o. female with medical history significant of A. fib, CHF, diabetes, CKD 3B, hypothyroidism, hypertension, hyperlipidemia, CVA, anemia, RLS, COPD who presents with weight gain and shortness of breath.  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 > BNP elevated to 444 in the ED and did have edema however no significant changes on chest x-ray.  Did have desaturation to the 80s requiring 2 L to maintain saturations. - Tolerating Lasix 40 mg IV twice daily - Daily weights, strict I's and O's - Trend renal function electrolytes -Clinically improving. Voiding well -Recheck bmet in AM   Atrial fibrillation - Continue home metoprolol and Eliquis   Recent pneumonia - Continue cefdinir   Diabetes - continued on 15 units lantus daily with SSI   CKD 3B > Creatinine stable in the ED at 1.56 -Avoid nephrotoxic agents -recheck bmet in AM   Hypothyroidism - Continue home Synthroid  Hypertension - Continue home metoprolol - Also receiving Lasix as above   Hyperlipidemia History of CVA -Continue home atorvastatin   RLS - Continue home pramipexole and Keppra   Obstructive lung disease - Continue home Yupelri - Replace home formoterol with formulary arformoterol     DVT prophylaxis: eliquis Code Status: Full Family Communication: Pt in room, family at bedside  Status is: Inpatient  Remains inpatient appropriate because:Inpatient level of care appropriate due to severity of illness  Dispo: The patient is from: Home              Anticipated d/c is to: Home              Patient currently is not medically stable to d/c.   Difficult to place patient No  Consultants:    Procedures:    Antimicrobials: Anti-infectives (From admission, onward)    Start     Dose/Rate Route Frequency Ordered Stop   04/13/21 1000  cefdinir (OMNICEF) capsule 300 mg        300 mg Oral Daily 04/12/21 2116 04/13/21 0856   04/12/21 2200  cefdinir (OMNICEF) capsule 300 mg  Status:  Discontinued        300 mg Oral 2 times daily 04/12/21 2054 04/12/21 2115       Subjective: Reports feeling better  Objective: Vitals:   04/14/21 0600 04/14/21 0739 04/14/21 0741 04/14/21 1355  BP:    (!) 124/49  Pulse:  83  84  Resp:  20  16  Temp:    98.4 F (36.9 C)  TempSrc:    Oral  SpO2:  94% 94% 100%  Weight: 70 kg     Height:        Intake/Output Summary (Last 24 hours) at 04/14/2021 1458 Last data filed at 04/14/2021 0800 Gross per 24 hour  Intake 475 ml  Output 1050 ml  Net -575 ml    Filed Weights   04/13/21 0257 04/14/21 0600  Weight: 72.6 kg 70 kg    Examination: General exam: Conversant, in no acute distress Respiratory system: normal chest rise, clear, no audible wheezing Cardiovascular system: regular rhythm, s1-s2 Gastrointestinal system: Nondistended, nontender, pos BS Central nervous system: No seizures, no tremors Extremities: No cyanosis, no joint deformities Skin: No rashes, no pallor Psychiatry: Affect normal // no auditory hallucinations   Data Reviewed: I have personally reviewed following labs and imaging studies  CBC: Recent Labs  Lab 04/08/21 0337 04/12/21 1413 04/12/21 1419 04/13/21 0229  WBC 10.3 7.0  --  7.2  NEUTROABS 8.1*  4.3  --   --   HGB 12.2 11.9* 11.6* 11.7*  HCT 40.3 38.5 34.0* 37.7  MCV 93.1 90.4  --  89.8  PLT 155 209  --  408    Basic Metabolic Panel: Recent Labs  Lab 04/08/21 0337 04/09/21 0349 04/10/21 0342 04/12/21 1413 04/12/21 1419 04/13/21 0229 04/14/21 0213  NA 138 136 137 135 137 137 138  K 4.3 3.7 3.8 4.1 4.0 4.3 3.1*  CL 107 106 103 102  --  99 95*  CO2 24 23 27 27   --  29 34*  GLUCOSE 111* 62* 121* 142*  --  147* 98  BUN 33* 27* 26* 20  --  18 19  CREATININE 1.55* 1.37* 1.52* 1.56*  --  1.51* 1.49*  CALCIUM 8.6* 8.8* 8.7* 8.5*  --  8.8* 8.7*  MG 1.6* 2.0  --   --   --  1.6*  --     GFR: Estimated Creatinine Clearance: 23.4 mL/min (A) (by C-G formula based on SCr of 1.49 mg/dL (H)). Liver Function Tests: Recent Labs  Lab 04/14/21 0213  AST 11*  ALT 15  ALKPHOS 61  BILITOT 0.5  PROT 6.4*  ALBUMIN 3.1*   No results for input(s): LIPASE, AMYLASE in the last 168 hours. No results for input(s): AMMONIA in the last 168 hours. Coagulation Profile: No results for input(s): INR, PROTIME in the last 168 hours. Cardiac Enzymes: No results for input(s): CKTOTAL, CKMB, CKMBINDEX, TROPONINI in the last 168 hours. BNP (last 3 results) No results for input(s): PROBNP in the last 8760 hours. HbA1C: No results for input(s): HGBA1C in the last 72 hours. CBG: Recent Labs  Lab 04/13/21 0741 04/13/21 1121 04/13/21 1609 04/13/21 2102 04/14/21 0611  GLUCAP 163* 109* 110* 146* 110*    Lipid Profile: No results for input(s): CHOL, HDL, LDLCALC, TRIG, CHOLHDL, LDLDIRECT in the last 72 hours. Thyroid Function Tests: No results for input(s): TSH, T4TOTAL, FREET4, T3FREE, THYROIDAB in the last 72 hours. Anemia Panel: No results for input(s): VITAMINB12, FOLATE, FERRITIN, TIBC, IRON, RETICCTPCT in the last 72 hours. Sepsis Labs: Recent Labs  Lab 04/08/21 0337 04/12/21 1413  PROCALCITON <0.10  --   LATICACIDVEN  --  1.2     Recent Results (from the past 240 hour(s))   Resp Panel by RT-PCR (Flu A&B, Covid) Nasopharyngeal Swab     Status: None   Collection Time: 04/06/21 11:29 AM   Specimen: Nasopharyngeal Swab; Nasopharyngeal(NP) swabs in vial transport medium  Result Value Ref Range Status   SARS Coronavirus 2 by RT PCR NEGATIVE NEGATIVE Final    Comment: (NOTE) SARS-CoV-2 target nucleic acids are NOT DETECTED.  The SARS-CoV-2 RNA is generally detectable in upper respiratory specimens during the acute phase of infection. The lowest concentration of SARS-CoV-2 viral copies this assay can detect is  138 copies/mL. A negative result does not preclude SARS-Cov-2 infection and should not be used as the sole basis for treatment or other patient management decisions. A negative result may occur with  improper specimen collection/handling, submission of specimen other than nasopharyngeal swab, presence of viral mutation(s) within the areas targeted by this assay, and inadequate number of viral copies(<138 copies/mL). A negative result must be combined with clinical observations, patient history, and epidemiological information. The expected result is Negative.  Fact Sheet for Patients:  EntrepreneurPulse.com.au  Fact Sheet for Healthcare Providers:  IncredibleEmployment.be  This test is no t yet approved or cleared by the [place] FDA and  has been authorized for detection and/or diagnosis of SARS-CoV-2 by FDA under an Emergency Use Authorization (EUA). This EUA will remain  in effect (meaning this test can be used) for the duration of the COVID-19 declaration under Section 564(b)(1) of the Act, 21 U.S.C.section 360bbb-3(b)(1), unless the authorization is terminated  or revoked sooner.       Influenza A by PCR NEGATIVE NEGATIVE Final   Influenza B by PCR NEGATIVE NEGATIVE Final    Comment: (NOTE) The Xpert Xpress SARS-CoV-2/FLU/RSV plus assay is intended as an aid in the diagnosis of influenza from  Nasopharyngeal swab specimens and should not be used as a sole basis for treatment. Nasal washings and aspirates are unacceptable for Xpert Xpress SARS-CoV-2/FLU/RSV testing.  Fact Sheet for Patients: EntrepreneurPulse.com.au  Fact Sheet for Healthcare Providers: IncredibleEmployment.be  This test is not yet approved or cleared by the [place] FDA and has been authorized for detection and/or diagnosis of SARS-CoV-2 by FDA under an Emergency Use Authorization (EUA). This EUA will remain in effect (meaning this test can be used) for the duration of the COVID-19 declaration under Section 564(b)(1) of the Act, 21 U.S.C. section 360bbb-3(b)(1), unless the authorization is terminated or revoked.  Performed at [facility], [address] [place], [place] [address]   Blood culture (routine x 2)     Status: None   Collection Time: 04/06/21 11:39 AM   Specimen: BLOOD  Result Value Ref Range Status   Specimen Description   Final    BLOOD LEFT ANTECUBITAL Performed at [facility], [address] [place], [place] [address]    Special Requests   Final    NONE Performed at [facility], [address] [place], [place] [address]    Culture   Final    NO GROWTH 5 DAYS Performed at [facility], [place] [address] [place], [place] [address]    Report Status 04/11/2021 FINAL  Final  Blood culture (routine x 2)     Status: None   Collection Time: 04/06/21 11:54 AM   Specimen: BLOOD  Result Value Ref Range Status   Specimen Description   Final    BLOOD RIGHT WRIST Performed at [facility], [address] [place], [place] [address]    Special Requests   Final    BOTTLES DRAWN AEROBIC AND ANAEROBIC Blood Culture adequate volume Performed at [facility], [address] [place], [place] [address]    Culture   Final    NO GROWTH 5 DAYS Performed at [facility], [place] [address], [place], [place] [address]    Report Status 04/11/2021 FINAL  Final  MRSA PCR Screening     Status: None   Collection Time: 04/06/21  5:27 PM  Result Value  Ref Range Status   MRSA by PCR NEGATIVE NEGATIVE Final    Comment:        The GeneXpert MRSA Assay (FDA approved for NASAL specimens only), is one component of a comprehensive MRSA colonization surveillance program. It is not intended to diagnose MRSA infection nor to guide or monitor treatment for MRSA infections. Performed at [facility], [place] [address] [place], [place] [address]   Resp Panel by RT-PCR (Flu A&B, Covid) Nasopharyngeal Swab     Status: None   Collection Time: 04/12/21  3:15 PM   Specimen: Nasopharyngeal Swab; Nasopharyngeal(NP) swabs in vial transport medium  Result Value Ref Range Status   SARS Coronavirus 2 by RT PCR NEGATIVE NEGATIVE Final    Comment: (NOTE) SARS-CoV-2 target nucleic acids are NOT DETECTED.  The SARS-CoV-2 RNA is generally detectable in upper respiratory specimens during the acute phase of infection. The lowest concentration of SARS-CoV-2 viral copies this assay can detect is 138 copies/mL. A negative result does not preclude SARS-Cov-2 infection and should not be used as the sole basis for treatment or other patient management decisions. A negative result may occur with  improper specimen collection/handling, submission of specimen other than nasopharyngeal swab, presence of viral mutation(s) within the areas targeted by this assay, and inadequate number of viral copies(<138 copies/mL). A negative result must be combined with clinical observations, patient history, and epidemiological information. The expected result is Negative.  Fact Sheet for Patients:  EntrepreneurPulse.com.au  Fact Sheet for Healthcare Providers:  IncredibleEmployment.be  This test is no t yet approved or cleared by the [place] FDA and  has been  authorized for detection and/or diagnosis of SARS-CoV-2 by FDA under an Emergency Use Authorization (EUA). This EUA will remain  in effect (meaning this test can be used) for the duration of the COVID-19 declaration under Section 564(b)(1) of the Act, 21 U.S.C.section 360bbb-3(b)(1), unless the authorization is terminated  or revoked sooner.       Influenza A by PCR NEGATIVE NEGATIVE Final   Influenza B by PCR NEGATIVE NEGATIVE Final    Comment: (NOTE) The Xpert Xpress SARS-CoV-2/FLU/RSV plus assay is intended as an aid in the diagnosis of influenza from Nasopharyngeal swab specimens and should not be used as a sole basis for treatment. Nasal washings and aspirates are unacceptable for Xpert Xpress SARS-CoV-2/FLU/RSV testing.  Fact Sheet for Patients: EntrepreneurPulse.com.au  Fact Sheet for Healthcare Providers: IncredibleEmployment.be  This test is not yet approved or cleared by the [place] FDA and has been authorized for detection and/or diagnosis of SARS-CoV-2 by FDA under an Emergency Use Authorization (EUA). This EUA will remain in effect (meaning this test can be used) for the duration of the COVID-19 declaration under Section 564(b)(1) of the Act, 21 U.S.C. section 360bbb-3(b)(1), unless the authorization is terminated or revoked.  Performed at [facility], [address] [place], [place] [address]       Radiology Studies: ECHOCARDIOGRAM COMPLETE  Result Date: 04/13/2021    ECHOCARDIOGRAM REPORT   Patient Name:   [name] Date of Exam: 04/13/2021 Medical Rec #:  [id number]     Height:       63.5 in Accession #:    [id number]    Weight:       160.0 lb Date of Birth:  1928-06-30     BSA:          1.769 m Patient Age:    3 years  BP:           100/82 mmHg Patient Gender: F             HR:           82 bpm. Exam Location:  Inpatient Procedure: 2D Echo, Cardiac Doppler and Color Doppler Indications:    CHF   History:        Patient has prior history of Echocardiogram examinations, most                 recent 07/16/2019. Arrythmias:Atrial Fibrillation; Risk                 Factors:Hypertension and Diabetes.  Sonographer:    Cammy Brochure Referring Phys: [id number] [place]  1. Left ventricular ejection fraction, by estimation, is 20 to 25%. The left ventricle has severely decreased function. The left ventricle demonstrates global hypokinesis. Left ventricular diastolic function could not be evaluated.  2. Right ventricular systolic function is normal. The right ventricular size is normal.  3. Left atrial size was mildly dilated.  4. The mitral valve is grossly normal. Mild to moderate mitral valve regurgitation.  5. Tricuspid valve regurgitation is mild to moderate.  6. The aortic valve is normal in structure. Aortic valve regurgitation is not visualized. FINDINGS  Left Ventricle: Left ventricular ejection fraction, by estimation, is 20 to 25%. The left ventricle has severely decreased function. The left ventricle demonstrates global hypokinesis. The left ventricular internal cavity size was normal in size. There is no left ventricular hypertrophy. Left ventricular diastolic function could not be evaluated due to atrial fibrillation. Left ventricular diastolic function could not be evaluated. Right Ventricle: The right ventricular size is normal. Right vetricular wall thickness was not well visualized. Right ventricular systolic function is normal. Left Atrium: Left atrial size was mildly dilated. Right Atrium: Right atrial size was normal in size. Pericardium: There is no evidence of pericardial effusion. Mitral Valve: The mitral valve is grossly normal. Mild to moderate mitral annular calcification. Mild to moderate mitral valve regurgitation. MV peak gradient, 9.4 mmHg. The mean mitral valve gradient is 3.0 mmHg. Tricuspid Valve: The tricuspid valve is grossly normal. Tricuspid valve  regurgitation is mild to moderate. Aortic Valve: The aortic valve is normal in structure. Aortic valve regurgitation is not visualized. Aortic valve mean gradient measures 3.0 mmHg. Aortic valve peak gradient measures 3.9 mmHg. Aortic valve area, by VTI measures 1.54 cm. Pulmonic Valve: The pulmonic valve was grossly normal. Pulmonic valve regurgitation is not visualized. Aorta: The aortic root and ascending aorta are structurally normal, with no evidence of dilitation. IAS/Shunts: The atrial septum is grossly normal.  LEFT VENTRICLE PLAX 2D LVIDd:         5.15 cm LVIDs:         4.40 cm LV PW:         1.20 cm LV IVS:        1.15 cm LVOT diam:     2.10 cm LV SV:         33 LV SV Index:   19 LVOT Area:     3.46 cm  RIGHT VENTRICLE          IVC RV Basal diam:  3.20 cm  IVC diam: 1.90 cm LEFT ATRIUM             Index       RIGHT ATRIUM           Index LA diam:  3.70 cm 2.09 cm/m  RA Area:     17.20 cm LA Vol (A2C):   59.4 ml 33.58 ml/m RA Volume:   45.10 ml  25.49 ml/m LA Vol (A4C):   71.6 ml 40.47 ml/m LA Biplane Vol: 64.9 ml 36.69 ml/m  AORTIC VALVE AV Area (Vmax):    1.81 cm AV Area (Vmean):   1.56 cm AV Area (VTI):     1.54 cm AV Vmax:           98.80 cm/s AV Vmean:          76.400 cm/s AV VTI:            0.214 m AV Peak Grad:      3.9 mmHg AV Mean Grad:      3.0 mmHg LVOT Vmax:         51.60 cm/s LVOT Vmean:        34.400 cm/s LVOT VTI:          0.095 m LVOT/AV VTI ratio: 0.45  AORTA Ao Root diam: 2.90 cm Ao Asc diam:  3.00 cm MITRAL VALVE                TRICUSPID VALVE MV Area (PHT): 3.43 cm     TR Peak grad:   31.1 mmHg MV Area VTI:   1.14 cm     TR Vmax:        279.00 cm/s MV Peak grad:  9.4 mmHg MV Mean grad:  3.0 mmHg     SHUNTS MV Vmax:       1.53 m/s     Systemic VTI:  0.10 m MV Vmean:      73.4 cm/s    Systemic Diam: 2.10 cm MV Decel Time: 221 msec MV E velocity: 140.00 cm/s Mertie Moores MD Electronically signed by Mertie Moores MD Signature Date/Time: 04/13/2021/3:39:35 PM    Final      Scheduled Meds:  apixaban  2.5 mg Oral BID   arformoterol  15 mcg Nebulization Q12H   atorvastatin  40 mg Oral Daily   FLUoxetine  40 mg Oral Daily   furosemide  40 mg Intravenous BID   insulin aspart  0-15 Units Subcutaneous TID WC   insulin glargine  15 Units Subcutaneous Daily   levETIRAcetam  250 mg Oral QHS   levothyroxine  50 mcg Oral Q0600   metoprolol tartrate  50 mg Oral BID   pantoprazole  40 mg Oral BID   potassium chloride  40 mEq Oral BID   pramipexole  0.25 mg Oral Q0600   pramipexole  0.75 mg Oral QHS   pregabalin  50 mg Oral QHS   revefenacin  175 mcg Nebulization Daily   sodium chloride flush  3 mL Intravenous Q12H   sucralfate  1 g Oral TID WC & HS   traZODone  100 mg Oral QHS   Continuous Infusions:   LOS: 2 days   Marylu Lund, MD [facility] Pager On Amion  If 7PM-7AM, please contact night-coverage 04/14/2021, 2:58 PM

## 2021-04-14 NOTE — Progress Notes (Signed)
Occupational Therapy Evaluation Patient Details Name: Melissa [name] MRN: [id number] DOB: 1928-01-26 Today's Date: 04/14/2021    History of Present Illness pt is a 87 y/o female admitted 6/30 with SOB and orthopnea due to acute CHF exacerbation after recent d/c from hospital for PNA and afib with RVR.  Edema improving with Lasix, but pt stil volume overloaded.  PMHx: Arthritis, CHF, CKD, HTN, NICM, RLS, Stroke, DM2   Clinical Impression   [place] was evaluated s/p the above impairments. PTA pt was mod I for all ADLs and received assistance for IADLs due to macular degeneration, pt denies any recent falls; pt has great family support. Upon evaluation, pt denies pain and performed bed mobility and transfers at a supervision/min guard level for safety. ADLs are set up / min guard level. Pt continues to benefit from OT acutely to maximize function in ADLs and mobility prior to d/c. Recommend d/c home without follow up therapies.     Follow Up Recommendations  No OT follow up;Supervision - Intermittent    Equipment Recommendations  Tub/shower bench       Precautions / Restrictions Precautions Precautions: Fall Precaution Comments: monitor HR, low vision/deficits Restrictions Weight Bearing Restrictions: No      Mobility Bed Mobility Overal bed mobility: Needs Assistance Bed Mobility: Supine to Sit     Supine to sit: Supervision     General bed mobility comments: no physcial assist, supervision for safety    Transfers Overall transfer level: Needs assistance Equipment used: Rolling walker (2 wheeled) Transfers: Sit to/from Stand Sit to Stand: Supervision         General transfer comment: no physcial assist, good saftey awareness    Balance Overall balance assessment: Needs assistance Sitting-balance support: Feet supported;No upper extremity supported Sitting balance-Leahy Scale: Fair     Standing balance support: No upper extremity supported;During functional  activity Standing balance-Leahy Scale: Fair Standing balance comment: can statically stand without assist device, but agrees is more unsteady than her baseline.           ADL either performed or assessed with clinical judgement   ADL Overall ADL's : Needs assistance/impaired Eating/Feeding: Independent;Sitting   Grooming: Wash/dry hands;Wash/dry face;Oral care;Applying deodorant;Brushing hair;Supervision/safety;Standing   Upper Body Bathing: Supervision/ safety;Sitting   Lower Body Bathing: Supervison/ safety;Sit to/from stand   Upper Body Dressing : Set up;Sitting   Lower Body Dressing: Set up;Sit to/from stand   Toilet Transfer: Min guard;Comfort height toilet;Ambulation   Toileting- Clothing Manipulation and Hygiene: Supervision/safety;Sitting/lateral lean       Functional mobility during ADLs: Min guard;Cueing for safety       Vision Baseline Vision/History: Macular Degeneration Patient Visual Report: No change from baseline Additional Comments: pt's family manages medication due to vision - she is able to cook and manage her own meals                Pertinent Vitals/Pain Pain Assessment: No/denies pain     Hand Dominance Right   Extremity/Trunk Assessment Upper Extremity Assessment Upper Extremity Assessment: Overall WFL for tasks assessed;Generalized weakness   Lower Extremity Assessment Lower Extremity Assessment: Defer to PT evaluation   Cervical / Trunk Assessment Cervical / Trunk Assessment: Normal   Communication Communication Communication: No difficulties;HOH   Cognition Arousal/Alertness: Awake/alert Behavior During Therapy: WFL for tasks assessed/performed Overall Cognitive Status: Within Functional Limits for tasks assessed             General Comments  VSS on RA - pt's niece present for session  Home Living Family/patient expects to be discharged to:: Private residence Living Arrangements: Children Available Help at  Discharge: Family;Available 24 hours/day Type of Home: Apartment Home Access: Level entry     Home Layout: One level     Bathroom Shower/Tub: Teacher, early years/pre: Standard     Home Equipment: Environmental consultant - 4 wheels;Cane - single point;Bedside commode;Walker - 2 wheels;Shower seat   Additional Comments: pt with great family support      Prior Functioning/Environment Level of Independence: Independent with assistive device(s)        Comments: does not drive, independent, uses cane or rollator at times        OT Problem List: Decreased strength;Decreased range of motion;Decreased activity tolerance;Impaired balance (sitting and/or standing);Decreased knowledge of use of DME or AE      OT Treatment/Interventions: Self-care/ADL training;Therapeutic exercise;Therapeutic activities;Patient/family education;DME and/or AE instruction    OT Goals(Current goals can be found in the care plan section) Acute Rehab OT Goals Patient Stated Goal: to go home OT Goal Formulation: With patient Potential to Achieve Goals: Good ADL Goals Pt Will Perform Upper Body Bathing: with modified independence;standing Pt Will Perform Lower Body Bathing: with modified independence;sit to/from stand Pt Will Perform Upper Body Dressing: with modified independence;standing Pt Will Perform Lower Body Dressing: with modified independence;sit to/from stand Pt Will Transfer to Toilet: with modified independence;ambulating;regular height toilet;grab bars Pt Will Perform Toileting - Clothing Manipulation and hygiene: with modified independence;sit to/from stand Pt Will Perform Tub/Shower Transfer: with modified independence;tub bench;ambulating  OT Frequency: Min 2X/week    AM-PAC OT "6 Clicks" Daily Activity     Outcome Measure Help from another person eating meals?: None Help from another person taking care of personal grooming?: None Help from another person toileting, which includes using  toliet, bedpan, or urinal?: A Little Help from another person bathing (including washing, rinsing, drying)?: A Little Help from another person to put on and taking off regular upper body clothing?: None Help from another person to put on and taking off regular lower body clothing?: A Little 6 Click Score: 21   End of Session Nurse Communication: Mobility status  Activity Tolerance: Patient tolerated treatment well Patient left: in bed;with call bell/phone within reach;with family/visitor present  OT Visit Diagnosis: Muscle weakness (generalized) (M62.81);Pain                Time: 1165-7903 OT Time Calculation (min): 14 min Charges:  OT General Charges $OT Visit: 1 Visit OT Evaluation $OT Eval Low Complexity: 1 Low  Baila Rouse A Savian Mazon 04/14/2021, 3:58 PM

## 2021-04-14 NOTE — Evaluation (Signed)
Physical Therapy Evaluation Patient Details Name: Melissa [name] MRN: [id number] DOB: 1928-01-20 Today's Date: 04/14/2021   History of Present Illness  pt is a 87 y/o female admitted 6/30 with SOB and orthopnea due to acute CHF exacerbation after recent d/c from hospital for PNA and afib with RVR.  Edema improving with Lasix, but pt stil volume overloaded.  PMHx: Arthritis, CHF, CKD, HTN, NICM, RLS, Stroke, DM2  Clinical Impression  Pt admitted with/for SOB due to CHF exacerbation.  Pt needing supervision to min assist for basic mobility and gait.  Pt currently limited functionally due to the problems listed below.  (see problems list.)  Pt will benefit from PT to maximize function and safety to be able to get home safely with available assist.     Follow Up Recommendations Home health PT    Equipment Recommendations  None recommended by PT    Recommendations for Other Services       Precautions / Restrictions Precautions Precautions: Fall Precaution Comments: monitor HR, low vision/deficits      Mobility  Bed Mobility Overal bed mobility: Needs Assistance Bed Mobility: Supine to Sit     Supine to sit: Supervision     General bed mobility comments: extra time, no assist    Transfers Overall transfer level: Needs assistance Equipment used: Rolling walker (2 wheeled) Transfers: Sit to/from Stand Sit to Stand: Supervision         General transfer comment: patient stood up withput AD,  cues to remember to reach back for surface before sitting.  Ambulation/Gait Ambulation/Gait assistance: Min assist;Min guard Gait Distance (Feet): 120 Feet (then after sitting rest 100 feet with 2L O2) Assistive device: Rolling walker (2 wheeled) Gait Pattern/deviations: Step-through pattern;Decreased stride length Gait velocity: decr Gait velocity interpretation: <1.8 ft/sec, indicate of risk for recurrent falls General Gait Details: 1st trial with RW and RA, pt became mildly more  Unsteady needing minimal assist for guidance and stability, sats dropped to 87% on RA.  2nd trial with 2L [place], pt more steady with RW, more RW control.  Unable to get a reading for SpO2.  Stairs            Wheelchair Mobility    Modified Rankin (Stroke Patients Only)       Balance Overall balance assessment: Needs assistance Sitting-balance support: Feet supported;No upper extremity supported Sitting balance-Leahy Scale: Fair       Standing balance-Leahy Scale: Fair Standing balance comment: can statically stand without assist device, but agrees is more unsteady than her baseline.                             Pertinent Vitals/Pain Pain Assessment: No/denies pain    Home Living Family/patient expects to be discharged to:: Private residence Living Arrangements: Children Available Help at Discharge: Family;Available 24 hours/day Type of Home: Apartment Home Access: Level entry     Home Layout: One level Home Equipment: Walker - 4 wheels;Cane - single point;Bedside commode;Walker - 2 wheels;Shower seat      Prior Function Level of Independence: Independent with assistive device(s)         Comments: does not drive, independent, uses cane at times     Hand Dominance   Dominant Hand: Right    Extremity/Trunk Assessment   Upper Extremity Assessment Upper Extremity Assessment: Defer to OT evaluation    Lower Extremity Assessment Lower Extremity Assessment: Generalized weakness    Cervical / Trunk Assessment Cervical /  Trunk Assessment: Normal  Communication   Communication: No difficulties;HOH  Cognition Arousal/Alertness: Awake/alert Behavior During Therapy: WFL for tasks assessed/performed Overall Cognitive Status: Within Functional Limits for tasks assessed                                        General Comments      Exercises     Assessment/Plan    PT Assessment Patient needs continued PT services  PT Problem  List Decreased strength;Decreased activity tolerance;Decreased balance;Decreased mobility;Decreased knowledge of use of DME;Cardiopulmonary status limiting activity       PT Treatment Interventions DME instruction;Gait training;Functional mobility training;Therapeutic activities;Balance training;Patient/family education    PT Goals (Current goals can be found in the Care Plan section)  Acute Rehab PT Goals Patient Stated Goal: to go home PT Goal Formulation: With patient Time For Goal Achievement: 04/21/21 Potential to Achieve Goals: Good    Frequency Min 3X/week   Barriers to discharge        Co-evaluation               AM-PAC PT "6 Clicks" Mobility  Outcome Measure Help needed turning from your back to your side while in a flat bed without using bedrails?: None Help needed moving from lying on your back to sitting on the side of a flat bed without using bedrails?: None Help needed moving to and from a bed to a chair (including a wheelchair)?: A Little Help needed standing up from a chair using your arms (e.g., wheelchair or bedside chair)?: A Little Help needed to walk in hospital room?: A Little Help needed climbing 3-5 steps with a railing? : A Little 6 Click Score: 20    End of Session Equipment Utilized During Treatment: Oxygen Activity Tolerance: Patient tolerated treatment well Patient left: in chair;with call bell/phone within reach Nurse Communication: Mobility status PT Visit Diagnosis: Difficulty in walking, not elsewhere classified (R26.2);Unsteadiness on feet (R26.81)    Time: 4193-7902 PT Time Calculation (min) (ACUTE ONLY): 30 min   Charges:   PT Evaluation $PT Eval Moderate Complexity: 1 Mod PT Treatments $Gait Training: 8-22 mins        04/14/2021  Ginger Carne., PT Acute Rehabilitation Services [phone]  (pager) [phone]  (office)  Tessie Fass Angelie Kram 04/14/2021, 1:53 PM

## 2021-04-15 LAB — COMPREHENSIVE METABOLIC PANEL
ALT: 14 U/L (ref 0–44)
AST: 12 U/L — ABNORMAL LOW (ref 15–41)
Albumin: 3.2 g/dL — ABNORMAL LOW (ref 3.5–5.0)
Alkaline Phosphatase: 60 U/L (ref 38–126)
Anion gap: 8 (ref 5–15)
BUN: 21 mg/dL (ref 8–23)
CO2: 38 mmol/L — ABNORMAL HIGH (ref 22–32)
Calcium: 8.8 mg/dL — ABNORMAL LOW (ref 8.9–10.3)
Chloride: 91 mmol/L — ABNORMAL LOW (ref 98–111)
Creatinine, Ser: 1.65 mg/dL — ABNORMAL HIGH (ref 0.44–1.00)
GFR, Estimated: 29 mL/min — ABNORMAL LOW (ref >60)
Glucose, Bld: 129 mg/dL — ABNORMAL HIGH (ref 70–99)
Potassium: 3.5 mmol/L (ref 3.5–5.1)
Sodium: 137 mmol/L (ref 135–145)
Total Bilirubin: 0.9 mg/dL (ref 0.3–1.2)
Total Protein: 6.3 g/dL — ABNORMAL LOW (ref 6.5–8.1)

## 2021-04-15 LAB — GLUCOSE, CAPILLARY
Glucose-Capillary: 245 mg/dL — ABNORMAL HIGH (ref 70–99)
Glucose-Capillary: 139 mg/dL — ABNORMAL HIGH (ref 70–99)
Glucose-Capillary: 147 mg/dL — ABNORMAL HIGH (ref 70–99)
Glucose-Capillary: 151 mg/dL — ABNORMAL HIGH (ref 70–99)

## 2021-04-15 MED ORDER — POTASSIUM CHLORIDE CRYS ER 20 MEQ PO TBCR
40.0000 meq | EXTENDED_RELEASE_TABLET | Freq: Once | ORAL | Status: AC
  Administered 2021-04-15: 40 meq via ORAL
  Filled 2021-04-15: qty 2

## 2021-04-15 NOTE — Plan of Care (Signed)

## 2021-04-15 NOTE — Progress Notes (Signed)
PROGRESS NOTE    Melissa [name]  ASN:[id number] DOB: 16-Jan-1928 DOA: 04/12/2021 PCP: Darreld Mclean, MD    Brief Narrative:  87 y.o. female with medical history significant of A. fib, CHF, diabetes, CKD 3B, hypothyroidism, hypertension, hyperlipidemia, CVA, anemia, RLS, COPD who presents with weight gain and shortness of breath.  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 > BNP elevated to 444 in the ED and did have edema however no significant changes on chest x-ray.  Did have desaturation to the 80s requiring 2 L to maintain saturations. - Clinically improved with lasix - Today cr is slightly higher with dryer mucus membranes. Will hold further lasix today   Atrial fibrillation - Continue with home metoprolol and Eliquis   Recent pneumonia - Continue cefdinir as tolerated   Diabetes - continued on 15 units lantus daily with SSI   CKD 3B > Creatinine stable in the ED at 1.56 -Avoid nephrotoxic agents -cont to follow bmet trends   Hypothyroidism - Continue home Synthroid  Hypertension - Continue home metoprolol - Also receiving Lasix as above   Hyperlipidemia History of CVA -Continue home atorvastatin as tolerated   RLS - Continue home pramipexole and Keppra   Obstructive lung disease - Continue home Yupelri - Replace home formoterol with formulary arformoterol    DVT  prophylaxis: eliquis Code Status: Full Family Communication: Pt in room, family at bedside  Status is: Inpatient  Remains inpatient appropriate because:Inpatient level of care appropriate due to severity of illness  Dispo: The patient is from: Home              Anticipated d/c is to: Home              Patient currently is not medically stable to d/c.   Difficult to place patient No  Consultants:    Procedures:    Antimicrobials: Anti-infectives (From admission, onward)    Start     Dose/Rate Route Frequency Ordered Stop   04/13/21 1000  cefdinir (OMNICEF) capsule 300 mg        300 mg Oral Daily 04/12/21 2116 04/13/21 0856   04/12/21 2200  cefdinir (OMNICEF) capsule 300 mg  Status:  Discontinued        300 mg Oral 2 times daily 04/12/21 2054 04/12/21 2115       Subjective: States feeling more tired this AM  Objective: Vitals:   04/15/21 0500 04/15/21 0824 04/15/21 0827 04/15/21 0958  BP: 137/65   104/77  Pulse: 82 (!) 105  (!) 101  Resp: 17 17  16   Temp: 98.2 F (36.8 C)   98.1 F (36.7 C)  TempSrc: Oral   Oral  SpO2: 93% 94% 94% 96%  Weight: 69.1 kg     Height:        Intake/Output Summary (Last 24 hours) at 04/15/2021 1538 Last data filed at 04/15/2021 1255 Gross per 24 hour  Intake 223  ml  Output --  Net 223 ml    Filed Weights   04/13/21 0257 04/14/21 0600 04/15/21 0500  Weight: 72.6 kg 70 kg 69.1 kg    Examination: General exam: Conversant, in no acute distress Respiratory system: normal chest rise, clear, no audible wheezing Cardiovascular system: regular rhythm, s1-s2 Gastrointestinal system: Nondistended, nontender, pos BS Central nervous system: No seizures, no tremors Extremities: No cyanosis, no joint deformities Skin: No rashes, no pallor Psychiatry: Affect normal // no auditory hallucinations   Data Reviewed: I have personally reviewed following labs and imaging studies  CBC: Recent Labs  Lab 04/12/21 1413 04/12/21 1419  04/13/21 0229  WBC 7.0  --  7.2  NEUTROABS 4.3  --   --   HGB 11.9* 11.6* 11.7*  HCT 38.5 34.0* 37.7  MCV 90.4  --  89.8  PLT 209  --  754    Basic Metabolic Panel: Recent Labs  Lab 04/09/21 0349 04/10/21 0342 04/12/21 1413 04/12/21 1419 04/13/21 0229 04/14/21 0213 04/15/21 0300  NA 136 137 135 137 137 138 137  K 3.7 3.8 4.1 4.0 4.3 3.1* 3.5  CL 106 103 102  --  99 95* 91*  CO2 23 27 27   --  29 34* 38*  GLUCOSE 62* 121* 142*  --  147* 98 129*  BUN 27* 26* 20  --  18 19 21   CREATININE 1.37* 1.52* 1.56*  --  1.51* 1.49* 1.65*  CALCIUM 8.8* 8.7* 8.5*  --  8.8* 8.7* 8.8*  MG 2.0  --   --   --  1.6*  --   --     GFR: Estimated Creatinine Clearance: 21 mL/min (A) (by C-G formula based on SCr of 1.65 mg/dL (H)). Liver Function Tests: Recent Labs  Lab 04/14/21 0213 04/15/21 0300  AST 11* 12*  ALT 15 14  ALKPHOS 61 60  BILITOT 0.5 0.9  PROT 6.4* 6.3*  ALBUMIN 3.1* 3.2*    No results for input(s): LIPASE, AMYLASE in the last 168 hours. No results for input(s): AMMONIA in the last 168 hours. Coagulation Profile: No results for input(s): INR, PROTIME in the last 168 hours. Cardiac Enzymes: No results for input(s): CKTOTAL, CKMB, CKMBINDEX, TROPONINI in the last 168 hours. BNP (last 3 results) No results for input(s): PROBNP in the last 8760 hours. HbA1C: No results for input(s): HGBA1C in the last 72 hours. CBG: Recent Labs  Lab 04/14/21 0611 04/14/21 1536 04/14/21 2118 04/15/21 0742 04/15/21 1127  GLUCAP 110* 134* 192* 245* 139*    Lipid Profile: No results for input(s): CHOL, HDL, LDLCALC, TRIG, CHOLHDL, LDLDIRECT in the last 72 hours. Thyroid Function Tests: No results for input(s): TSH, T4TOTAL, FREET4, T3FREE, THYROIDAB in the last 72 hours. Anemia Panel: No results for input(s): VITAMINB12, FOLATE, FERRITIN, TIBC, IRON, RETICCTPCT in the last 72 hours. Sepsis Labs: Recent Labs  Lab 04/12/21 1413  LATICACIDVEN 1.2     Recent Results (from the  past 240 hour(s))  Resp Panel by RT-PCR (Flu A&B, Covid) Nasopharyngeal Swab     Status: None   Collection Time: 04/06/21 11:29 AM   Specimen: Nasopharyngeal Swab; Nasopharyngeal(NP) swabs in vial transport medium  Result Value Ref Range Status   SARS Coronavirus 2 by RT PCR NEGATIVE NEGATIVE Final    Comment: (NOTE) SARS-CoV-2 target nucleic acids are NOT DETECTED.  The SARS-CoV-2 RNA is generally detectable in upper respiratory specimens during the acute phase of infection. The lowest concentration of SARS-CoV-2 viral copies this assay can detect  is 138 copies/mL. A negative result does not preclude SARS-Cov-2 infection and should not be used as the sole basis for treatment or other patient management decisions. A negative result may occur with  improper specimen collection/handling, submission of specimen other than nasopharyngeal swab, presence of viral mutation(s) within the areas targeted by this assay, and inadequate number of viral copies(<138 copies/mL). A negative result must be combined with clinical observations, patient history, and epidemiological information. The expected result is Negative.  Fact Sheet for Patients:  EntrepreneurPulse.com.au  Fact Sheet for Healthcare Providers:  IncredibleEmployment.be  This test is no t yet approved or cleared by the [place] FDA and  has been authorized for detection and/or diagnosis of SARS-CoV-2 by FDA under an Emergency Use Authorization (EUA). This EUA will remain  in effect (meaning this test can be used) for the duration of the COVID-19 declaration under Section 564(b)(1) of the Act, 21 U.S.C.section 360bbb-3(b)(1), unless the authorization is terminated  or revoked sooner.       Influenza A by PCR NEGATIVE NEGATIVE Final   Influenza B by PCR NEGATIVE NEGATIVE Final    Comment: (NOTE) The Xpert Xpress SARS-CoV-2/FLU/RSV plus assay is intended as an aid in the diagnosis of  influenza from Nasopharyngeal swab specimens and should not be used as a sole basis for treatment. Nasal washings and aspirates are unacceptable for Xpert Xpress SARS-CoV-2/FLU/RSV testing.  Fact Sheet for Patients: EntrepreneurPulse.com.au  Fact Sheet for Healthcare Providers: IncredibleEmployment.be  This test is not yet approved or cleared by the [place] FDA and has been authorized for detection and/or diagnosis of SARS-CoV-2 by FDA under an Emergency Use Authorization (EUA). This EUA will remain in effect (meaning this test can be used) for the duration of the COVID-19 declaration under Section 564(b)(1) of the Act, 21 U.S.C. section 360bbb-3(b)(1), unless the authorization is terminated or revoked.  Performed at [facility], [address] [place], [place] [address]   Blood culture (routine x 2)     Status: None   Collection Time: 04/06/21 11:39 AM   Specimen: BLOOD  Result Value Ref Range Status   Specimen Description   Final    BLOOD LEFT ANTECUBITAL Performed at [facility], [address] [place], [place] [address]    Special Requests   Final    NONE Performed at [facility], [address] [place], [place] [address]    Culture   Final    NO GROWTH 5 DAYS Performed at [facility], [place] [address] [place], [place] [address]    Report Status 04/11/2021 FINAL  Final  Blood culture (routine x 2)     Status: None   Collection Time: 04/06/21 11:54 AM   Specimen: BLOOD  Result Value Ref Range Status   Specimen Description   Final    BLOOD RIGHT WRIST Performed at [facility], [address] [place], [place] [address]    Special Requests   Final    BOTTLES DRAWN AEROBIC AND ANAEROBIC Blood Culture adequate volume Performed at [facility], [address] [place], [place] [address]    Culture   Final    NO GROWTH 5 DAYS Performed at [place], [place] [address], [place], [place] [address]    Report Status 04/11/2021 FINAL  Final  MRSA PCR Screening     Status: None   Collection Time: 04/06/21  5:27 PM  Result  Value Ref Range Status   MRSA by PCR NEGATIVE NEGATIVE Final    Comment:        The GeneXpert MRSA Assay (FDA approved for NASAL specimens only), is one component of a comprehensive MRSA colonization surveillance program. It is not intended to diagnose MRSA infection nor to guide or monitor treatment for MRSA infections. Performed at [facility], [place] [address] [place], [place] [address]   Resp Panel by RT-PCR (Flu A&B, Covid) Nasopharyngeal Swab     Status: None   Collection Time: 04/12/21  3:15 PM   Specimen: Nasopharyngeal Swab; Nasopharyngeal(NP) swabs in vial transport medium  Result Value Ref Range Status   SARS Coronavirus 2 by RT PCR NEGATIVE NEGATIVE Final    Comment: (NOTE) SARS-CoV-2 target nucleic acids are NOT DETECTED.  The SARS-CoV-2 RNA is generally detectable in upper respiratory specimens during the acute phase of infection. The lowest concentration of SARS-CoV-2 viral copies this assay can detect is 138 copies/mL. A negative result does not preclude SARS-Cov-2 infection and should not be used as the sole basis for treatment or other patient management decisions. A negative result may occur with  improper specimen collection/handling, submission of specimen other than nasopharyngeal swab, presence of viral mutation(s) within the areas targeted by this assay, and inadequate number of viral copies(<138 copies/mL). A negative result must be combined with clinical observations, patient history, and epidemiological information. The expected result is Negative.  Fact Sheet for Patients:  EntrepreneurPulse.com.au  Fact Sheet for Healthcare Providers:  IncredibleEmployment.be  This test is no t yet approved or cleared by the [place] FDA  and  has been authorized for detection and/or diagnosis of SARS-CoV-2 by FDA under an Emergency Use Authorization (EUA). This EUA will remain  in effect (meaning this test can be used) for the duration of the COVID-19 declaration under Section 564(b)(1) of the Act, 21 U.S.C.section 360bbb-3(b)(1), unless the authorization is terminated  or revoked sooner.       Influenza A by PCR NEGATIVE NEGATIVE Final   Influenza B by PCR NEGATIVE NEGATIVE Final    Comment: (NOTE) The Xpert Xpress SARS-CoV-2/FLU/RSV plus assay is intended as an aid in the diagnosis of influenza from Nasopharyngeal swab specimens and should not be used as a sole basis for treatment. Nasal washings and aspirates are unacceptable for Xpert Xpress SARS-CoV-2/FLU/RSV testing.  Fact Sheet for Patients: EntrepreneurPulse.com.au  Fact Sheet for Healthcare Providers: IncredibleEmployment.be  This test is not yet approved or cleared by the [place] FDA and has been authorized for detection and/or diagnosis of SARS-CoV-2 by FDA under an Emergency Use Authorization (EUA). This EUA will remain in effect (meaning this test can be used) for the duration of the COVID-19 declaration under Section 564(b)(1) of the Act, 21 U.S.C. section 360bbb-3(b)(1), unless the authorization is terminated or revoked.  Performed at [facility], [address], [place], [place] [address]       Radiology Studies: No results found.  Scheduled Meds:  apixaban  2.5 mg Oral BID   arformoterol  15 mcg Nebulization Q12H   atorvastatin  40 mg Oral Daily   FLUoxetine  40 mg Oral Daily   insulin aspart  0-15 Units Subcutaneous TID WC   insulin glargine  15 Units Subcutaneous Daily   levETIRAcetam  250 mg Oral QHS   levothyroxine  50 mcg Oral Q0600   metoprolol tartrate  50 mg Oral BID   pantoprazole  40 mg Oral BID   pramipexole  0.25 mg Oral Q0600   pramipexole  0.75 mg Oral QHS    pregabalin  50 mg Oral QHS   revefenacin  175 mcg Nebulization Daily   sodium chloride flush  3 mL Intravenous Q12H   sucralfate  1 g Oral TID WC & HS   traZODone  100 mg Oral QHS   Continuous Infusions:   LOS: 3 days   Marylu Lund, MD [facility] Pager On Amion  If 7PM-7AM, please contact night-coverage 04/15/2021, 3:38 PM

## 2021-04-16 LAB — COMPREHENSIVE METABOLIC PANEL
ALT: 12 U/L (ref 0–44)
AST: 14 U/L — ABNORMAL LOW (ref 15–41)
Albumin: 3.3 g/dL — ABNORMAL LOW (ref 3.5–5.0)
Alkaline Phosphatase: 57 U/L (ref 38–126)
Anion gap: 9 (ref 5–15)
BUN: 21 mg/dL (ref 8–23)
CO2: 33 mmol/L — ABNORMAL HIGH (ref 22–32)
Calcium: 8.8 mg/dL — ABNORMAL LOW (ref 8.9–10.3)
Chloride: 94 mmol/L — ABNORMAL LOW (ref 98–111)
Creatinine, Ser: 1.7 mg/dL — ABNORMAL HIGH (ref 0.44–1.00)
GFR, Estimated: 28 mL/min — ABNORMAL LOW (ref >60)
Glucose, Bld: 154 mg/dL — ABNORMAL HIGH (ref 70–99)
Potassium: 3.8 mmol/L (ref 3.5–5.1)
Sodium: 136 mmol/L (ref 135–145)
Total Bilirubin: 0.7 mg/dL (ref 0.3–1.2)
Total Protein: 6.4 g/dL — ABNORMAL LOW (ref 6.5–8.1)

## 2021-04-16 LAB — BASIC METABOLIC PANEL
Anion gap: 7 (ref 5–15)
BUN: 23 mg/dL (ref 8–23)
CO2: 35 mmol/L — ABNORMAL HIGH (ref 22–32)
Calcium: 8.4 mg/dL — ABNORMAL LOW (ref 8.9–10.3)
Chloride: 93 mmol/L — ABNORMAL LOW (ref 98–111)
Creatinine, Ser: 1.65 mg/dL — ABNORMAL HIGH (ref 0.44–1.00)
GFR, Estimated: 29 mL/min — ABNORMAL LOW (ref >60)
Glucose, Bld: 190 mg/dL — ABNORMAL HIGH (ref 70–99)
Potassium: 3.9 mmol/L (ref 3.5–5.1)
Sodium: 135 mmol/L (ref 135–145)

## 2021-04-16 LAB — GLUCOSE, CAPILLARY
Glucose-Capillary: 158 mg/dL — ABNORMAL HIGH (ref 70–99)
Glucose-Capillary: 183 mg/dL — ABNORMAL HIGH (ref 70–99)
Glucose-Capillary: 189 mg/dL — ABNORMAL HIGH (ref 70–99)

## 2021-04-16 MED ORDER — SODIUM CHLORIDE 0.9 % IV SOLN: INTRAVENOUS | Status: AC

## 2021-04-16 NOTE — Plan of Care (Signed)
  Problem: Education: ?Goal: Knowledge of General Education information will improve ?Description: Including pain rating scale, medication(s)/side effects and non-pharmacologic comfort measures ?Outcome: Completed/Met ?  ?Problem: Health Behavior/Discharge Planning: ?Goal: Ability to manage health-related needs will improve ?Outcome: Completed/Met ?  ?Problem: Clinical Measurements: ?Goal: Ability to maintain clinical measurements within normal limits will improve ?Outcome: Completed/Met ?Goal: Will remain free from infection ?Outcome: Completed/Met ?Goal: Diagnostic test results will improve ?Outcome: Completed/Met ?Goal: Respiratory complications will improve ?Outcome: Completed/Met ?Goal: Cardiovascular complication will be avoided ?Outcome: Completed/Met ?  ?Problem: Activity: ?Goal: Risk for activity intolerance will decrease ?Outcome: Completed/Met ?  ?Problem: Nutrition: ?Goal: Adequate nutrition will be maintained ?Outcome: Completed/Met ?  ?Problem: Coping: ?Goal: Level of anxiety will decrease ?Outcome: Completed/Met ?  ?Problem: Elimination: ?Goal: Will not experience complications related to bowel motility ?Outcome: Completed/Met ?Goal: Will not experience complications related to urinary retention ?Outcome: Completed/Met ?  ?Problem: Pain Managment: ?Goal: General experience of comfort will improve ?Outcome: Completed/Met ?  ?Problem: Safety: ?Goal: Ability to remain free from injury will improve ?Outcome: Completed/Met ?  ?Problem: Skin Integrity: ?Goal: Risk for impaired skin integrity will decrease ?Outcome: Completed/Met ?  ?Problem: Education: ?Goal: Ability to demonstrate management of disease process will improve ?Outcome: Completed/Met ?Goal: Ability to verbalize understanding of medication therapies will improve ?Outcome: Completed/Met ?Goal: Individualized Educational Video(s) ?Outcome: Completed/Met ?  ?Problem: Activity: ?Goal: Capacity to carry out activities will improve ?Outcome:  Completed/Met ?  ?Problem: Cardiac: ?Goal: Ability to achieve and maintain adequate cardiopulmonary perfusion will improve ?Outcome: Completed/Met ?  ?

## 2021-04-16 NOTE — Discharge Summary (Signed)
Physician Discharge Summary  Melissa [name] MMN:[id number] DOB: 12/03/1927 DOA: 04/12/2021  PCP: Darreld Mclean, MD  Admit date: 04/12/2021 Discharge date: 04/16/2021  Admitted From: Home Disposition:  Home  Recommendations for Outpatient Follow-up:  Follow up with PCP in 1-2 weeks Follow up with Cardiology as scheduled  Discharge Condition:Improved CODE STATUS:Full Diet recommendation: Heart healthy, diabetic   Brief/Interim Summary: 87 y.o. female with medical history significant of A. fib, CHF, diabetes, CKD 3B, hypothyroidism, hypertension, hyperlipidemia, CVA, anemia, RLS, COPD who presents with weight gain and shortness of breath.   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 > BNP elevated to 444 in the ED and did have edema however no significant changes on chest x-ray.  Did have desaturation to the 80s requiring 2 L to maintain saturations. - Clinically much improved with lasix - Cr did trend up to 1.65 with pt still voiding very well -Discussed case with on-call Cardiologist who recommends continuing home lasix dose, OK to d/c today and f/u with primary Cardiologist as scheduled on 7/5   Atrial fibrillation - Continue with home metoprolol and Eliquis   Recent pneumonia - Continue cefdinir as tolerated   Diabetes - continued on 15 units lantus daily with SSI while in hospital    CKD 3B > Creatinine stable in the ED at 1.56 -Cr up to 1.65 at time of d/c -Would repeat bmet in 1-2 weeks   Hypothyroidism - Continued home Synthroid  Hypertension - Continue home metoprolol and lasix   Hyperlipidemia History of CVA -Continue home atorvastatin as tolerated   RLS - Continue home pramipexole and Keppra   Obstructive lung disease - Continue home Yupelri - Replaced home formoterol with formulary arformoterol    Discharge Instructions   Allergies as of 04/16/2021   No Known Allergies      Medication List     STOP taking these medications    cefdinir 300 MG capsule Commonly known as: OMNICEF       TAKE these medications    apixaban 2.5 MG Tabs tablet Commonly known as: ELIQUIS Take 1 tablet (2.5 mg total) by mouth 2 (two) times daily.   atorvastatin 40 MG tablet Commonly known as: LIPITOR TAKE 1 TABLET DAILY   blood glucose meter kit and supplies Kit Dispense based on patient and insurance preference. Use up to four times daily as directed.   Blood Glucose Monitoring Suppl Devi Dispense one meter to use and monitor glucose as needed   cetirizine 10 MG tablet Commonly known as: ZYRTEC Take 10 mg by mouth daily.   Fish Oil 1000 MG Caps Take 1,000 mg by mouth at bedtime.   FLUoxetine 40 MG capsule Commonly known as: PROZAC Take 1 capsule (40 mg total) by mouth daily.   Flutter Devi 1 Device by Does not apply route as needed.   furosemide 40 MG tablet Commonly known as: LASIX Take 1 tablet (40 mg total) by mouth daily.  guaiFENesin 600 MG 12 hr tablet Commonly known as: MUCINEX Take 1 tablet (600 mg total) by mouth 2 (two) times daily for 7 days.   HumaLOG KwikPen 100 UNIT/ML KwikPen Generic drug: insulin lispro Inject 6 units under the skin daily with supper.   ICaps Areds 2 Caps Take 1 capsule by mouth 2 (two) times daily.   Integra 62.5-62.5-40-3 MG Caps Take 1 capsule by mouth daily.   Levemir FlexTouch 100 UNIT/ML  FlexPen Generic drug: insulin detemir Inject 25 Units into the skin daily.   levETIRAcetam 250 MG tablet Commonly known as: KEPPRA Take 1 tablet (250 mg total) by mouth at bedtime.   levothyroxine 50 MCG tablet Commonly known as: SYNTHROID Take 1 tablet (50 mcg total) by mouth daily before breakfast.   metoprolol tartrate 50 MG tablet Commonly known as: LOPRESSOR Take 1 tablet (50 mg total) by mouth 2 (two) times daily. Notes to patient: Tonight around 04/16/21 around 1000 pm   ondansetron 8 MG tablet Commonly known as: ZOFRAN Take 1 tablet (8 mg total) by mouth every 8 (eight) hours as needed for nausea or vomiting.   OneTouch Delica Lancets 69S Misc Use to check blood sugar 4 times per day dx code E11.29   OneTouch Verio test strip Generic drug: glucose blood USE AS INSTRUCTED TO CHECK BLOOD SUGAR 4 TIMES PER DAY   Ozempic (0.25 or 0.5 MG/DOSE) 2 MG/1.5ML Sopn Generic drug: Semaglutide(0.25 or 0.5MG/DOS) INJECT 0.5 MG UNDER THE SKIN EVERY SUNDAY Notes to patient: Sunday 04/22/21   pantoprazole 40 MG tablet Commonly known as: PROTONIX Take 1 tablet (40 mg total) by mouth 2 (two) times daily. Continue taking once a day after finishing the course of twice a day for  2 months Notes to patient: Tonight 04/16/21 around 0900 pm   Perforomist 20 MCG/2ML nebulizer solution Generic drug: formoterol Take 2 mLs (20 mcg total) by nebulization 2 (two) times daily. Notes to patient: Tonight around 04/16/21 around 1000 pm   polyethylene glycol 17 g packet Commonly known as: MIRALAX / GLYCOLAX Take 17 g by mouth daily. Notes to patient: Resume home regimen   potassium chloride SA 20 MEQ tablet Commonly known as: KLOR-CON Take 1 tablet (20 mEq total) by mouth daily. Notes to patient: Resume Home Regimen   pramipexole 0.75 MG tablet Commonly known as: MIRAPEX TAKE 1 TABLET AT BEDTIME What changed: Another medication with the same name was changed. Make sure you understand how and when  to take each. Notes to patient: Tonight around 04/16/21 around 1000 pm   pramipexole 0.25 MG tablet Commonly known as: Mirapex Take 1 tablet at 5 pm What changed:  how much to take how to take this when to take this additional instructions Notes to patient: Tomorrow 04/17/21 around 0500 pm   pregabalin 50 MG capsule Commonly known as: Lyrica Take 1-2 capsules (50-100 mg total) by mouth at bedtime. What changed: how much to take Notes to patient: Tonight around 04/16/21 around 1000 pm   sucralfate 1 GM/10ML suspension Commonly known as: CARAFATE Take 10 mLs (1 g total) by mouth 4 (four) times daily -  with meals and at bedtime for 14 days. Notes to patient: Tonight around 04/16/21 around 1000 pm   traZODone 100 MG tablet Commonly known as: DESYREL Take 1 tablet (100 mg total) by mouth at bedtime. Notes to patient: Tonight around 04/16/21 around 1000 pm   Vitamin D3 25 MCG (1000 UT) Caps Take 1,000 Units by mouth daily. 25 mcg Notes to  patient: Resume home regimen tomorrow 04/17/21   Maretta Bees 175 MCG/3ML nebulizer solution Generic drug: revefenacin Take 3 mLs (175 mcg total) by nebulization daily. Notes to patient: Tomorrow 04/18/19 around 0800 am               Durable Medical Equipment  (From admission, onward)           Start     Ordered   04/16/21 1426  For home use only DME Tub bench  Once        04/16/21 1425            Follow-up Information     Copland, Gay Filler, MD Follow up in 1 week(s).   Specialty: Family Medicine Why: Hospital follow up Contact information: [address] [place] [address] [phone]         [name], MD .   Specialty: Cardiology Contact information: [address] [address] [facility] Follow up.   Why: tub transter bench arranged- to be delivered to room prior to discharge Contact information: [place]  [address] [phone]         [facility] Follow [facility].   Specialty: Home Health Services Why: HHPT arranged- they will contact you to schedule home visits Contact information: [address] [place] [address] [phone]                No Known Allergies  Consultations: Discussed case with Cardiology over phone  Procedures/Studies: DG Ankle Complete Right  Result Date: 03/19/2021 CLINICAL DATA:  RIGHT foot and ankle pain, fell a week ago, swelling and bruising EXAM: RIGHT ANKLE - COMPLETE 3+ VIEW COMPARISON:  None FINDINGS: Osseous demineralization. Scattered soft tissue swelling. Joint spaces preserved. No acute fracture, dislocation, or bone destruction. IMPRESSION: No acute osseous abnormalities. Electronically Signed   By: Lavonia Dana M.D.   On: 03/19/2021 16:53   DG Chest Port 1 View  Result Date: 04/12/2021 CLINICAL DATA:  Shortness of breath EXAM: PORTABLE CHEST 1 VIEW COMPARISON:  01/07/2021 FINDINGS: The heart size and mediastinal contours are within normal limits. Chronic interstitial changes. No new consolidation or edema. No pleural effusion or pneumothorax. No acute osseous abnormality. IMPRESSION: No acute process in the chest. Electronically Signed   By: Macy Mis M.D.   On: 04/12/2021 14:57   DG Chest Port 1 View  Result Date: 04/06/2021 CLINICAL DATA:  Shortness of breath and cough for 3 days, history asthma, CHF EXAM: PORTABLE CHEST 1 VIEW COMPARISON:  Portable exam 1104 hours compared to 03/09/2021 FINDINGS: Upper normal heart size. Mediastinal contours and pulmonary vascularity normal. Atherosclerotic calcification aorta. Chronic bronchitic changes without pulmonary infiltrate, pleural effusion, or pneumothorax. Osseous demineralization with prior proximal RIGHT humeral ORIF. IMPRESSION: Chronic bronchitic changes. No acute abnormalities. Aortic Atherosclerosis (ICD10-I70.0). Electronically Signed   By: Lavonia Dana M.D.   On:  04/06/2021 11:43   DG Foot 2 Views Right  Result Date: 03/19/2021 CLINICAL DATA:  [place] 2 weeks ago, RIGHT ankle and foot pain EXAM: RIGHT FOOT - 2 VIEW COMPARISON:  None FINDINGS: Osseous demineralization. Minimal hallux valgus. Joint spaces preserved. Scattered soft tissue swelling at ankle. No acute fracture, dislocation, or bone destruction. IMPRESSION: No acute osseous abnormalities. Electronically Signed   By: Lavonia Dana M.D.   On: 03/19/2021 16:49   ECHOCARDIOGRAM COMPLETE  Result Date: 04/13/2021  ECHOCARDIOGRAM REPORT   Patient Name:   [name] Date of Exam: 04/13/2021 Medical Rec #:  [id number]     Height:       63.5 in Accession #:    [id number]    Weight:       160.0 lb Date of Birth:  April 04, 1928     BSA:          1.769 m Patient Age:    88 years      BP:           100/82 mmHg Patient Gender: F             HR:           82 bpm. Exam Location:  Inpatient Procedure: 2D Echo, Cardiac Doppler and Color Doppler Indications:    CHF  History:        Patient has prior history of Echocardiogram examinations, most                 recent 07/16/2019. Arrythmias:Atrial Fibrillation; Risk                 Factors:Hypertension and Diabetes.  Sonographer:    Cammy Brochure Referring Phys: [id number] [place]  1. Left ventricular ejection fraction, by estimation, is 20 to 25%. The left ventricle has severely decreased function. The left ventricle demonstrates global hypokinesis. Left ventricular diastolic function could not be evaluated.  2. Right ventricular systolic function is normal. The right ventricular size is normal.  3. Left atrial size was mildly dilated.  4. The mitral valve is grossly normal. Mild to moderate mitral valve regurgitation.  5. Tricuspid valve regurgitation is mild to moderate.  6. The aortic valve is normal in structure. Aortic valve regurgitation is not visualized. FINDINGS  Left Ventricle: Left ventricular ejection fraction, by estimation, is 20 to 25%. The left  ventricle has severely decreased function. The left ventricle demonstrates global hypokinesis. The left ventricular internal cavity size was normal in size. There is no left ventricular hypertrophy. Left ventricular diastolic function could not be evaluated due to atrial fibrillation. Left ventricular diastolic function could not be evaluated. Right Ventricle: The right ventricular size is normal. Right vetricular wall thickness was not well visualized. Right ventricular systolic function is normal. Left Atrium: Left atrial size was mildly dilated. Right Atrium: Right atrial size was normal in size. Pericardium: There is no evidence of pericardial effusion. Mitral Valve: The mitral valve is grossly normal. Mild to moderate mitral annular calcification. Mild to moderate mitral valve regurgitation. MV peak gradient, 9.4 mmHg. The mean mitral valve gradient is 3.0 mmHg. Tricuspid Valve: The tricuspid valve is grossly normal. Tricuspid valve regurgitation is mild to moderate. Aortic Valve: The aortic valve is normal in structure. Aortic valve regurgitation is not visualized. Aortic valve mean gradient measures 3.0 mmHg. Aortic valve peak gradient measures 3.9 mmHg. Aortic valve area, by VTI measures 1.54 cm. Pulmonic Valve: The pulmonic valve was grossly normal. Pulmonic valve regurgitation is not visualized. Aorta: The aortic root and ascending aorta are structurally normal, with no evidence of dilitation. IAS/Shunts: The atrial septum is grossly normal.  LEFT VENTRICLE PLAX 2D LVIDd:         5.15 cm LVIDs:         4.40 cm LV PW:         1.20 cm LV IVS:        1.15 cm LVOT diam:     2.10 cm LV SV:  33 LV SV Index:   19 LVOT Area:     3.46 cm  RIGHT VENTRICLE          IVC RV Basal diam:  3.20 cm  IVC diam: 1.90 cm LEFT ATRIUM             Index       RIGHT ATRIUM           Index LA diam:        3.70 cm 2.09 cm/m  RA Area:     17.20 cm LA Vol (A2C):   59.4 ml 33.58 ml/m RA Volume:   45.10 ml  25.49 ml/m LA  Vol (A4C):   71.6 ml 40.47 ml/m LA Biplane Vol: 64.9 ml 36.69 ml/m  AORTIC VALVE AV Area (Vmax):    1.81 cm AV Area (Vmean):   1.56 cm AV Area (VTI):     1.54 cm AV Vmax:           98.80 cm/s AV Vmean:          76.400 cm/s AV VTI:            0.214 m AV Peak Grad:      3.9 mmHg AV Mean Grad:      3.0 mmHg LVOT Vmax:         51.60 cm/s LVOT Vmean:        34.400 cm/s LVOT VTI:          0.095 m LVOT/AV VTI ratio: 0.45  AORTA Ao Root diam: 2.90 cm Ao Asc diam:  3.00 cm MITRAL VALVE                TRICUSPID VALVE MV Area (PHT): 3.43 cm     TR Peak grad:   31.1 mmHg MV Area VTI:   1.14 cm     TR Vmax:        279.00 cm/s MV Peak grad:  9.4 mmHg MV Mean grad:  3.0 mmHg     SHUNTS MV Vmax:       1.53 m/s     Systemic VTI:  0.10 m MV Vmean:      73.4 cm/s    Systemic Diam: 2.10 cm MV Decel Time: 221 msec MV E velocity: 140.00 cm/s Mertie Moores MD Electronically signed by Mertie Moores MD Signature Date/Time: 04/13/2021/3:39:35 PM    Final     Subjective: Very eager to go home  Discharge Exam: Vitals:   04/16/21 0824 04/16/21 1435  BP:  113/71  Pulse: 97 93  Resp: 18 19  Temp:  97.7 F (36.5 C)  SpO2: 92% 95%   Vitals:   04/16/21 0749 04/16/21 0750 04/16/21 0824 04/16/21 1435  BP:  124/83  113/71  Pulse:  94 97 93  Resp:  16 18 19   Temp:    97.7 F (36.5 C)  TempSrc:    Oral  SpO2: 98% 100% 92% 95%  Weight:      Height:        General: Pt is alert, awake, not in acute distress Cardiovascular: RRR, S1/S2 + Respiratory: CTA bilaterally, no wheezing, no rhonchi Abdominal: Soft, NT, ND, bowel sounds + Extremities: no edema, no cyanosis   The results of significant diagnostics from this hospitalization (including imaging, microbiology, ancillary and laboratory) are listed below for reference.     Microbiology: Recent Results (from the past 240 hour(s))  Resp Panel by RT-PCR (Flu A&B, Covid) Nasopharyngeal Swab     Status: None  Collection Time: 04/12/21  3:15 PM   Specimen:  Nasopharyngeal Swab; Nasopharyngeal(NP) swabs in vial transport medium  Result Value Ref Range Status   SARS Coronavirus 2 by RT PCR NEGATIVE NEGATIVE Final    Comment: (NOTE) SARS-CoV-2 target nucleic acids are NOT DETECTED.  The SARS-CoV-2 RNA is generally detectable in upper respiratory specimens during the acute phase of infection. The lowest concentration of SARS-CoV-2 viral copies this assay can detect is 138 copies/mL. A negative result does not preclude SARS-Cov-2 infection and should not be used as the sole basis for treatment or other patient management decisions. A negative result may occur with  improper specimen collection/handling, submission of specimen other than nasopharyngeal swab, presence of viral mutation(s) within the areas targeted by this assay, and inadequate number of viral copies(<138 copies/mL). A negative result must be combined with clinical observations, patient history, and epidemiological information. The expected result is Negative.  Fact Sheet for Patients:  EntrepreneurPulse.com.au  Fact Sheet for Healthcare Providers:  IncredibleEmployment.be  This test is no t yet approved or cleared by the [place] FDA and  has been authorized for detection and/or diagnosis of SARS-CoV-2 by FDA under an Emergency Use Authorization (EUA). This EUA will remain  in effect (meaning this test can be used) for the duration of the COVID-19 declaration under Section 564(b)(1) of the Act, 21 U.S.C.section 360bbb-3(b)(1), unless the authorization is terminated  or revoked sooner.       Influenza A by PCR NEGATIVE NEGATIVE Final   Influenza B by PCR NEGATIVE NEGATIVE Final    Comment: (NOTE) The Xpert Xpress SARS-CoV-2/FLU/RSV plus assay is intended as an aid in the diagnosis of influenza from Nasopharyngeal swab specimens and should not be used as a sole basis for treatment. Nasal washings and aspirates are unacceptable for  Xpert Xpress SARS-CoV-2/FLU/RSV testing.  Fact Sheet for Patients: EntrepreneurPulse.com.au  Fact Sheet for Healthcare Providers: IncredibleEmployment.be  This test is not yet approved or cleared by the [place] FDA and has been authorized for detection and/or diagnosis of SARS-CoV-2 by FDA under an Emergency Use Authorization (EUA). This EUA will remain in effect (meaning this test can be used) for the duration of the COVID-19 declaration under Section 564(b)(1) of the Act, 21 U.S.C. section 360bbb-3(b)(1), unless the authorization is terminated or revoked.  Performed at [facility], [address] [place], [place] [address]      Labs: BNP (last 3 results) Recent Labs    03/09/21 1519 04/06/21 1129 04/12/21 1413  BNP 208.0* 210.7* 655.3*   Basic Metabolic Panel: Recent Labs  Lab 04/13/21 0229 04/14/21 0213 04/15/21 0300 04/16/21 0631 04/16/21 1343  NA 137 138 137 136 135  K 4.3 3.1* 3.5 3.8 3.9  CL 99 95* 91* 94* 93*  CO2 29 34* 38* 33* 35*  GLUCOSE 147* 98 129* 154* 190*  BUN 18 19 21 21 23   CREATININE 1.51* 1.49* 1.65* 1.70* 1.65*  CALCIUM 8.8* 8.7* 8.8* 8.8* 8.4*  MG 1.6*  --   --   --   --    Liver Function Tests: Recent Labs  Lab 04/14/21 0213 04/15/21 0300 04/16/21 0631  AST 11* 12* 14*  ALT 15 14 12   ALKPHOS 61 60 57  BILITOT 0.5 0.9 0.7  PROT 6.4* 6.3* 6.4*  ALBUMIN 3.1* 3.2* 3.3*   No results for input(s): LIPASE, AMYLASE in the last 168 hours. No results for input(s): AMMONIA in the last 168 hours. CBC: Recent Labs  Lab 04/12/21 1413  04/12/21 1419 04/13/21 0229  WBC 7.0  --  7.2  NEUTROABS 4.3  --   --   HGB 11.9* 11.6* 11.7*  HCT 38.5 34.0* 37.7  MCV 90.4  --  89.8  PLT 209  --  203   Cardiac Enzymes: No results for input(s): CKTOTAL, CKMB, CKMBINDEX, TROPONINI in the last 168 hours. BNP: Invalid input(s): POCBNP CBG: Recent Labs  Lab 04/15/21 1612 04/15/21 2121  04/16/21 0748 04/16/21 1126 04/16/21 1636  GLUCAP 147* 151* 158* 183* 189*   D-Dimer No results for input(s): DDIMER in the last 72 hours. Hgb A1c No results for input(s): HGBA1C in the last 72 hours. Lipid Profile No results for input(s): CHOL, HDL, LDLCALC, TRIG, CHOLHDL, LDLDIRECT in the last 72 hours. Thyroid function studies No results for input(s): TSH, T4TOTAL, T3FREE, THYROIDAB in the last 72 hours.  Invalid input(s): FREET3 Anemia work up No results for input(s): VITAMINB12, FOLATE, FERRITIN, TIBC, IRON, RETICCTPCT in the last 72 hours. Urinalysis    Component Value Date/Time   COLORURINE YELLOW 03/09/2021 1634   APPEARANCEUR CLOUDY (A) 03/09/2021 1634   LABSPEC 1.020 03/09/2021 1634   PHURINE 5.5 03/09/2021 1634   GLUCOSEU NEGATIVE 03/09/2021 1634   GLUCOSEU NEGATIVE 09/22/2019 1032   HGBUR TRACE (A) 03/09/2021 1634   BILIRUBINUR negative 03/19/2021 1539   KETONESUR NEGATIVE 03/09/2021 1634   PROTEINUR Positive (A) 03/19/2021 1539   PROTEINUR NEGATIVE 03/09/2021 1634   UROBILINOGEN 0.2 03/19/2021 1539   UROBILINOGEN 0.2 09/22/2019 1032   NITRITE negative 03/19/2021 1539   NITRITE NEGATIVE 03/09/2021 1634   LEUKOCYTESUR Small (1+) (A) 03/19/2021 1539   LEUKOCYTESUR MODERATE (A) 03/09/2021 1634   Sepsis Labs Invalid input(s): PROCALCITONIN,  WBC,  LACTICIDVEN Microbiology Recent Results (from the past 240 hour(s))  Resp Panel by RT-PCR (Flu A&B, Covid) Nasopharyngeal Swab     Status: None   Collection Time: 04/12/21  3:15 PM   Specimen: Nasopharyngeal Swab; Nasopharyngeal(NP) swabs in vial transport medium  Result Value Ref Range Status   SARS Coronavirus 2 by RT PCR NEGATIVE NEGATIVE Final    Comment: (NOTE) SARS-CoV-2 target nucleic acids are NOT DETECTED.  The SARS-CoV-2 RNA is generally detectable in upper respiratory specimens during the acute phase of infection. The lowest concentration of SARS-CoV-2 viral copies this assay can detect is 138  copies/mL. A negative result does not preclude SARS-Cov-2 infection and should not be used as the sole basis for treatment or other patient management decisions. A negative result may occur with  improper specimen collection/handling, submission of specimen other than nasopharyngeal swab, presence of viral mutation(s) within the areas targeted by this assay, and inadequate number of viral copies(<138 copies/mL). A negative result must be combined with clinical observations, patient history, and epidemiological information. The expected result is Negative.  Fact Sheet for Patients:  EntrepreneurPulse.com.au  Fact Sheet for Healthcare Providers:  IncredibleEmployment.be  This test is no t yet approved or cleared by the [place] FDA and  has been authorized for detection and/or diagnosis of SARS-CoV-2 by FDA under an Emergency Use Authorization (EUA). This EUA will remain  in effect (meaning this test can be used) for the duration of the COVID-19 declaration under Section 564(b)(1) of the Act, 21 U.S.C.section 360bbb-3(b)(1), unless the authorization is terminated  or revoked sooner.       Influenza A by PCR NEGATIVE NEGATIVE Final   Influenza B by PCR NEGATIVE NEGATIVE Final    Comment: (NOTE) The Xpert Xpress SARS-CoV-2/FLU/RSV plus assay is intended as an  aid in the diagnosis of influenza from Nasopharyngeal swab specimens and should not be used as a sole basis for treatment. Nasal washings and aspirates are unacceptable for Xpert Xpress SARS-CoV-2/FLU/RSV testing.  Fact Sheet for Patients: EntrepreneurPulse.com.au  Fact Sheet for Healthcare Providers: IncredibleEmployment.be  This test is not yet approved or cleared by the [place] FDA and has been authorized for detection and/or diagnosis of SARS-CoV-2 by FDA under an Emergency Use Authorization (EUA). This EUA will remain in effect (meaning  this test can be used) for the duration of the COVID-19 declaration under Section 564(b)(1) of the Act, 21 U.S.C. section 360bbb-3(b)(1), unless the authorization is terminated or revoked.  Performed at [facility], [address] [place], [place] [address]    Time spent: 30 min  SIGNED:   Marylu Lund, MD  [facility] 04/16/2021, 6:00 PM  If 7PM-7AM, please contact night-coverage

## 2021-04-16 NOTE — Progress Notes (Signed)
Occupational Therapy Treatment and Discharge Patient Details Name: Melissa [name] MRN: [id number] DOB: 31-Dec-1927 Today's Date: 04/16/2021    History of present illness pt is a 87 y/o female admitted 6/30 with SOB and orthopnea due to acute CHF exacerbation after recent d/c from hospital for PNA and afib with RVR.  Edema improving with Lasix, but pt stil volume overloaded.  PMHx: Arthritis, CHF, CKD, HTN, NICM, RLS, Stroke, DM2   OT comments  Pt completed all goals this session and demonstrating functional mobility and ADL performance at baseline. Pt was educated on energy conservation and DME needs once discharged. Pt completed 15 mins of OOB activity this session and tolerated it well with no rest breaks. Pt eager to go home. Acute OT is signing off at this time as all needs and goals are met. Please re-consult if needs change.    Follow Up Recommendations  No OT follow up;Supervision - Intermittent    Equipment Recommendations  Tub/shower bench    Recommendations for Other Services      Precautions / Restrictions Precautions Precautions: Fall Precaution Comments: monitor HR, low vision/deficits Restrictions Weight Bearing Restrictions: No       Mobility Bed Mobility               General bed mobility comments: Pt up in recliner on arrival    Transfers Overall transfer level: Modified independent Equipment used: Rolling walker (2 wheeled) Transfers: Sit to/from Stand Sit to Stand: Modified independent (Device/Increase time)         General transfer comment: no physcial assist, good saftey awareness    Balance Overall balance assessment: No apparent balance deficits (not formally assessed)                                         ADL either performed or assessed with clinical judgement   ADL Overall ADL's : Modified independent;At baseline                                       General ADL Comments: Pt is able to complete all  ADL's at baseline level, with appropriate rest breaks as needed.     Vision       Perception     Praxis      Cognition Arousal/Alertness: Awake/alert Behavior During Therapy: WFL for tasks assessed/performed Overall Cognitive Status: Within Functional Limits for tasks assessed                                          Exercises     Shoulder Instructions       General Comments VSS on RA, O2 dropped to 85% with ambulation, however pleth reading was not stable, at rest pt O2 at 98% on RA    Pertinent Vitals/ Pain       Pain Assessment: No/denies pain  Home Living                                          Prior Functioning/Environment              Frequency  Min 2X/week  Progress Toward Goals  OT Goals(current goals can now be found in the care plan section)  Progress towards OT goals: Progressing toward goals  Acute Rehab OT Goals Patient Stated Goal: to go home OT Goal Formulation: All assessment and education complete, DC therapy Potential to Achieve Goals: Good ADL Goals Pt Will Perform Upper Body Bathing: with modified independence;standing Pt Will Perform Lower Body Bathing: with modified independence;sit to/from stand Pt Will Perform Upper Body Dressing: with modified independence;standing Pt Will Perform Lower Body Dressing: with modified independence;sit to/from stand Pt Will Transfer to Toilet: with modified independence;ambulating;regular height toilet;grab bars Pt Will Perform Toileting - Clothing Manipulation and hygiene: with modified independence;sit to/from stand Pt Will Perform Tub/Shower Transfer: with modified independence;tub bench;ambulating  Plan All goals met and education completed, patient discharged from OT services    Co-evaluation                 AM-PAC OT "6 Clicks" Daily Activity     Outcome Measure   Help from another person eating meals?: None Help from another person  taking care of personal grooming?: None Help from another person toileting, which includes using toliet, bedpan, or urinal?: None Help from another person bathing (including washing, rinsing, drying)?: None Help from another person to put on and taking off regular upper body clothing?: None Help from another person to put on and taking off regular lower body clothing?: None 6 Click Score: 24    End of Session Equipment Utilized During Treatment: Gait belt;Rolling walker  OT Visit Diagnosis: Unsteadiness on feet (R26.81);Muscle weakness (generalized) (M62.81)   Activity Tolerance Patient tolerated treatment well   Patient Left in bed;with call bell/phone within reach;with family/visitor present   Nurse Communication Mobility status        Time: 3888-7579 OT Time Calculation (min): 16 min  Charges: OT General Charges $OT Visit: 1 Visit OT Treatments $Self Care/Home Management : 8-22 mins  Melissa Kattner H., OTR/L Acute Rehabilitation   Melissa [name] Melissa [name] 04/16/2021, 4:03 PM
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
## 2021-04-16 NOTE — TOC Initial Note (Signed)
Transition of Care (TOC) - Initial/Assessment Note  Marvetta Gibbons RN, BSN Transitions of Care Unit 4E- RN Case Manager See Treatment Team for direct phone #  [place]  Patient Details  Name: Melissa [name] MRN: [id number] Date of Birth: April 29, 1928  Transition of Care [facility]) CM/SW Contact:    Dawayne Patricia, RN Phone Number: 04/16/2021, 3:00 PM  Clinical Narrative:                 Pt admitted from home, per pt/family pt is active with [facility] services from [place]- will need to confirm prior to discharge and will need resumption orders.   Discussed DME needs- pt reports she has all needed DME- however would like a tub bench for home- pt agreeable to using in house provider. order placed and will call [employer] prior to discharge for delivery.   TOC to continue to follow for transition needs.   Expected Discharge Plan: [place] Barriers to Discharge: Continued Medical Work up   Patient Goals and CMS Choice Patient states their goals for this hospitalization and ongoing recovery are:: return home CMS Medicare.gov Compare Post Acute Care list provided to:: Patient Choice offered to / list presented to : Patient, Adult Children  Expected Discharge Plan and Services Expected Discharge Plan: [place]   Discharge Planning Services: CM Consult Post Acute Care Choice: Durable Medical Equipment, Home Health, Resumption of Svcs/PTA Provider Living arrangements for the past 2 months: Apartment                 DME Arranged: Tub bench DME Agency: [facility]       HH Arranged: PT [place] Agency: [place]        Prior Living Arrangements/Services Living arrangements for the past 2 months: Apartment Lives with:: Self Patient language and need for interpreter reviewed:: Yes        Need for Family Participation in Patient Care: Yes (Comment) Care giver support system in place?: Yes (comment) Current home services: DME Criminal  Activity/Legal Involvement Pertinent to Current Situation/Hospitalization: No - Comment as needed  Activities of Daily Living Home Assistive Devices/Equipment: Dentures (specify type), Cane (specify quad or straight), Nebulizer ADL Screening (condition at time of admission) Patient's cognitive ability adequate to safely complete daily activities?: Yes Is the patient deaf or have difficulty hearing?: Yes Does the patient have difficulty seeing, even when wearing glasses/contacts?: Yes Does the patient have difficulty concentrating, remembering, or making decisions?: Yes Patient able to express need for assistance with ADLs?: Yes Does the patient have difficulty dressing or bathing?: No Independently performs ADLs?: No Communication: Independent Dressing (OT): Independent Grooming: Independent Feeding: Independent Bathing: Independent Toileting: Needs assistance Is this a change from baseline?: Pre-admission baseline In/Out Bed: Needs assistance Is this a change from baseline?: Pre-admission baseline Walks in Home: Needs assistance Is this a change from baseline?: Pre-admission baseline Does the patient have difficulty walking or climbing stairs?: Yes Weakness of Legs: Both Weakness of Arms/Hands: Both  Permission Sought/Granted Permission sought to share information with : Facility Art therapist granted to share information with : Yes, Verbal Permission Granted     Permission granted to share info w AGENCY: HH/DME        Emotional Assessment Appearance:: Appears stated age Attitude/Demeanor/Rapport: Engaged Affect (typically observed): Accepting, Appropriate, Pleasant Orientation: : Oriented to Self, Oriented to Place, Oriented to  Time, Oriented to Situation Alcohol / Substance Use: Not Applicable Psych Involvement: No (comment)  Admission diagnosis:  Acute on chronic systolic (congestive) heart failure (HCC) [I50.23] Acute on chronic congestive heart  failure, unspecified heart failure type [facility]) [I50.9] Patient Active Problem List   Diagnosis Date Noted   Acute on chronic systolic CHF (congestive heart failure) ([place]) 04/12/2021   Atrial fibrillation with rapid ventricular response ([place]) PMH PAF 04/06/2021   Acquired thrombophilia ([place]) 04/06/2021   Osteopenia 08/29/2020   Healthcare maintenance 07/06/2020   Hypokalemia 02/25/2020   Urgency of urination 02/25/2020   Chest pain 02/08/2020   Bronchiectasis without complication ([place]) 21/97/5883   Obstructive lung disease ([place]) 01/28/2020   Atrial fibrillation with RVR ([place])    Pneumonia 07/11/2019   Abnormal chest CT 04/07/2018   RLS (restless legs syndrome) 08/05/2017   Closed fracture of right proximal humerus 06/05/2017   High cholesterol 06/05/2017   Hypertension 06/05/2017   NICM (nonischemic cardiomyopathy) ([place]) 06/05/2017   Anemia 06/05/2017   Dyspnea 25/49/8264   Chronic systolic heart failure ([place]) 10/19/2016   Symptomatic cholelithiasis 01/24/2016   History of CVA- old cerebellar infarcts noted on MRI 11/16/2015   Chest pain with moderate risk of acute coronary syndrome 11/14/2015   Left upper extremity numbness    Paresthesia 08/22/2015   Tremor, essential 08/22/2015   Paroxysmal atrial fibrillation (HCC) 03/06/2015   Chronic anticoagulation 03/06/2015   Orthostatic hypotension 03/06/2015   Essential hypertension 04/19/2014   CKD stage IIIb ([place]) 04/19/2014   Hypothyroidism, acquired 06/15/2013   Normocytic anemia 06/10/2013   Type 2 diabetes mellitus with diabetic neuropathy, with long-term current use of insulin ([place]) 09/22/2011   HLD (hyperlipidemia) 09/22/2011   PCP:  Darreld Mclean, MD Pharmacy:   [place], [place] [address] Phone: [phone] Fax: [phone]  [facility] [place], [place] - [address] [place]  [address] Phone: [phone] Fax: [place] [address], [place] [address] Phone: [phone] Fax: [phone]     Social Determinants of Health (SDOH) Interventions    Readmission Risk Interventions No flowsheet data found.

## 2021-04-16 NOTE — TOC Transition Note (Signed)
Transition of Care (TOC) - CM/SW Discharge Note Marvetta Gibbons RN, BSN Transitions of Care Unit 4E- RN Case Manager See Treatment Team for direct phone #  [id number]  Patient Details  Name: Melissa [name] MRN: [id number] Date of Birth: Jul 13, 1928  Transition of Care [facility]) CM/SW Contact:  Dawayne Patricia, RN Phone Number: 04/16/2021, 3:30 PM   Clinical Narrative:    Pt has been cleared for discharge today, call made to [facility] with Alvis Lemmings- confirmed that they have referral from previous admit- and can still accept for [facility] needs- HHPT. They will f/u for start of care.   [facility] called for tub bench request- DME- Tub bench to be delivered to room prior to discharge.   Family to transport home.    Final next level of care: [place] Barriers to Discharge: Barriers Resolved   Patient Goals and CMS Choice Patient states their goals for this hospitalization and ongoing recovery are:: return home CMS Medicare.gov Compare Post Acute Care list provided to:: Patient Choice offered to / list presented to : Patient, Adult Children  Discharge Placement                 Home with [facility]      Discharge Plan and Services   Discharge Planning Services: CM Consult Post Acute Care Choice: Durable Medical Equipment, Home Health, Resumption of Svcs/PTA Provider          DME Arranged: Tub bench DME Agency: [facility] Date DME Agency Contacted: 04/16/21 Time DME Agency Contacted: [id number] Representative spoke with at DME Agency: Freda Munro HH Arranged: PT [place]: [place] Date [place]: 04/16/21 Time [place]: [place] Representative spoke with at [place]: [place] ([place]) Interventions     Readmission Risk Interventions Readmission Risk Prevention Plan 04/16/2021  Transportation Screening Complete  PCP or Specialist Appt within 3-5 Days Complete  HRI or [place] Complete  Social Work Consult  for [place] Planning/Counseling Complete  Palliative Care Screening Not Applicable  Medication Review Press photographer) Complete  Some recent data might be hidden

## 2021-04-16 NOTE — Progress Notes (Signed)
D/C instructions included but not limited to chf edu, nutrition, daily wts, when to call 911 vs MD, upcoming appmts, & meds. All  questions answered & verified via teachback method.

## 2021-04-17 ENCOUNTER — Other Ambulatory Visit: Payer: Self-pay

## 2021-04-17 ENCOUNTER — Telehealth: Payer: Self-pay

## 2021-04-17 ENCOUNTER — Ambulatory Visit (INDEPENDENT_AMBULATORY_CARE_PROVIDER_SITE_OTHER): Payer: Medicare Other | Admitting: Physician Assistant

## 2021-04-17 ENCOUNTER — Encounter: Payer: Self-pay | Admitting: Physician Assistant

## 2021-04-17 VITALS — BP 122/60 | HR 98 | Ht 63.5 in | Wt 155.8 lb

## 2021-04-17 DIAGNOSIS — Z0000 Encounter for general adult medical examination without abnormal findings: Secondary | ICD-10-CM

## 2021-04-17 DIAGNOSIS — N1832 Chronic kidney disease, stage 3b: Secondary | ICD-10-CM

## 2021-04-17 DIAGNOSIS — I5020 Unspecified systolic (congestive) heart failure: Secondary | ICD-10-CM | POA: Diagnosis not present

## 2021-04-17 DIAGNOSIS — I480 Paroxysmal atrial fibrillation: Secondary | ICD-10-CM | POA: Diagnosis not present

## 2021-04-17 DIAGNOSIS — I5022 Chronic systolic (congestive) heart failure: Secondary | ICD-10-CM

## 2021-04-17 DIAGNOSIS — I10 Essential (primary) hypertension: Secondary | ICD-10-CM

## 2021-04-17 MED ORDER — METOPROLOL TARTRATE 50 MG PO TABS: 75.0000 mg | ORAL_TABLET | Freq: Two times a day (BID) | ORAL | 3 refills | Status: DC

## 2021-04-17 NOTE — Patient Outreach (Signed)
[facility]) Care Management  04/17/2021  Melissa [name] 02/02/1928 [id number]   [place] Organization [ACO] Patient: [employer] [facility]  Reason for referral:  Chronic Care Management, high risk score readmission, HF exacerb   Patient screened for hospitalization with less than 7 days readmission noted high risk score for unplanned readmission risk and  to assess for potential [place] Management service needs for post hospital transition.  Review of patient's medical record reveals patient is for home with home health.  Patient already transitioned home.   Plan:  Patient's PCP has an Embedded CCM program and will refer for follow up.    For questions contact:   Natividad Brood, RN BSN [place] [facility] Liaison  [phone] business mobile phone Toll free office [phone]  Fax number: [phone] [email or web address]

## 2021-04-17 NOTE — Patient Instructions (Signed)
Medication Instructions:   CHANGE METOPROLOL one and one half tablet ( 75 mg) twice daily.   *If you need a refill on your cardiac medications before your next appointment, please call your pharmacy*   Lab Work:  Your physician recommends that you return for lab work on Tuesday, July 19 between 7:30 - 4:30.   If you have labs (blood work) drawn today and your tests are completely normal, you will receive your results only by: [place] (if you have MyChart) OR A paper copy in the mail If you have any lab test that is abnormal or we need to change your treatment, we will call you to review the results.   Testing/Procedures:  -None   Follow-Up: At [facility], you and your health needs are our priority.  As part of our continuing mission to provide you with exceptional heart care, we have created designated Provider Care Teams.  These Care Teams include your primary Cardiologist (physician) and Advanced Practice Providers (APPs -  Physician Assistants and Nurse Practitioners) who all work together to provide you with the care you need, when you need it.  We recommend signing up for the patient portal called "MyChart".  Sign up information is provided on this After Visit Summary.  MyChart is used to connect with patients for Virtual Visits (Telemedicine).  Patients are able to view lab/test results, encounter notes, upcoming appointments, etc.  Non-urgent messages can be sent to your provider as well.   To learn more about what you can do with MyChart, go to [email or web address].    Your next appointment:   4 week(s) with Richardson Dopp, PA-C on Friday, August 5 @ 12:15 pm.   The format for your next appointment:   In Person  Provider:   Richardson Dopp, PA-C   Other Instructions

## 2021-04-17 NOTE — Telephone Encounter (Signed)
Transition Care Management Follow-up Telephone Call Date of discharge and from where: 04/16/21 -[place] How have you been since you were released from the hospital? Doing well per daugher Any questions or concerns? No  Items Reviewed: Did the pt receive and understand the discharge instructions provided? Yes  Medications obtained and verified? Yes  Other? Yes  Any new allergies since your discharge? No  Dietary orders reviewed? Yes Do you have support at home? Yes   Home Care and Equipment/Supplies: Were home health services ordered? yes If so, what is the name of the agency? [facility]  Has the agency set up a time to come to the patient's home? yes Were any new equipment or medical supplies ordered?  Yes: Tub seat What is the name of the medical supply agency? [facility] Were you able to get the supplies/equipment? yes Do you have any questions related to the use of the equipment or supplies? No  Functional Questionnaire: (I = Independent and D = Dependent) ADLs: I with assistance  Bathing/Dressing- I with assistance  Meal Prep- D  Eating- I  Maintaining continence- I with assistance  Transferring/Ambulation- I with walker & assistance  Managing Meds- D  Follow up appointments reviewed:  PCP Hospital f/u appt confirmed? Yes  Scheduled to see Dr. Lorelei Pont on 04/26/2021 @ 3:40. [facility] f/u appt confirmed? Yes  patient saw cardiologist today. Are transportation arrangements needed? No  If their condition worsens, is the pt aware to call PCP or go to the Emergency Dept.? Yes Was the patient provided with contact information for the PCP's office or ED? Yes Was to pt encouraged to call back with questions or concerns? Yes

## 2021-04-18 DIAGNOSIS — I081 Rheumatic disorders of both mitral and tricuspid valves: Secondary | ICD-10-CM | POA: Diagnosis not present

## 2021-04-18 DIAGNOSIS — E039 Hypothyroidism, unspecified: Secondary | ICD-10-CM | POA: Diagnosis not present

## 2021-04-18 DIAGNOSIS — E1142 Type 2 diabetes mellitus with diabetic polyneuropathy: Secondary | ICD-10-CM | POA: Diagnosis not present

## 2021-04-18 DIAGNOSIS — D6859 Other primary thrombophilia: Secondary | ICD-10-CM | POA: Diagnosis not present

## 2021-04-18 DIAGNOSIS — K219 Gastro-esophageal reflux disease without esophagitis: Secondary | ICD-10-CM | POA: Diagnosis not present

## 2021-04-18 DIAGNOSIS — Z87891 Personal history of nicotine dependence: Secondary | ICD-10-CM | POA: Diagnosis not present

## 2021-04-18 DIAGNOSIS — Z79899 Other long term (current) drug therapy: Secondary | ICD-10-CM | POA: Diagnosis not present

## 2021-04-18 DIAGNOSIS — I5023 Acute on chronic systolic (congestive) heart failure: Secondary | ICD-10-CM | POA: Diagnosis not present

## 2021-04-18 DIAGNOSIS — I130 Hypertensive heart and chronic kidney disease with heart failure and stage 1 through stage 4 chronic kidney disease, or unspecified chronic kidney disease: Secondary | ICD-10-CM | POA: Diagnosis not present

## 2021-04-18 DIAGNOSIS — J440 Chronic obstructive pulmonary disease with acute lower respiratory infection: Secondary | ICD-10-CM | POA: Diagnosis not present

## 2021-04-18 DIAGNOSIS — I480 Paroxysmal atrial fibrillation: Secondary | ICD-10-CM | POA: Diagnosis not present

## 2021-04-18 DIAGNOSIS — J181 Lobar pneumonia, unspecified organism: Secondary | ICD-10-CM | POA: Diagnosis not present

## 2021-04-18 DIAGNOSIS — I428 Other cardiomyopathies: Secondary | ICD-10-CM | POA: Diagnosis not present

## 2021-04-18 DIAGNOSIS — E7800 Pure hypercholesterolemia, unspecified: Secondary | ICD-10-CM | POA: Diagnosis not present

## 2021-04-18 DIAGNOSIS — N1832 Chronic kidney disease, stage 3b: Secondary | ICD-10-CM | POA: Diagnosis not present

## 2021-04-18 DIAGNOSIS — K449 Diaphragmatic hernia without obstruction or gangrene: Secondary | ICD-10-CM | POA: Diagnosis not present

## 2021-04-18 DIAGNOSIS — Z8673 Personal history of transient ischemic attack (TIA), and cerebral infarction without residual deficits: Secondary | ICD-10-CM | POA: Diagnosis not present

## 2021-04-18 DIAGNOSIS — Z7901 Long term (current) use of anticoagulants: Secondary | ICD-10-CM | POA: Diagnosis not present

## 2021-04-18 DIAGNOSIS — G2581 Restless legs syndrome: Secondary | ICD-10-CM | POA: Diagnosis not present

## 2021-04-18 DIAGNOSIS — M150 Primary generalized (osteo)arthritis: Secondary | ICD-10-CM | POA: Diagnosis not present

## 2021-04-18 DIAGNOSIS — Z9181 History of falling: Secondary | ICD-10-CM | POA: Diagnosis not present

## 2021-04-18 DIAGNOSIS — I951 Orthostatic hypotension: Secondary | ICD-10-CM | POA: Diagnosis not present

## 2021-04-18 DIAGNOSIS — E1122 Type 2 diabetes mellitus with diabetic chronic kidney disease: Secondary | ICD-10-CM | POA: Diagnosis not present

## 2021-04-18 DIAGNOSIS — Z794 Long term (current) use of insulin: Secondary | ICD-10-CM | POA: Diagnosis not present

## 2021-04-18 DIAGNOSIS — F419 Anxiety disorder, unspecified: Secondary | ICD-10-CM | POA: Diagnosis not present

## 2021-04-19 LAB — GLUCOSE, CAPILLARY
Glucose-Capillary: 150 mg/dL — ABNORMAL HIGH (ref 70–99)
Glucose-Capillary: 131 mg/dL — ABNORMAL HIGH (ref 70–99)

## 2021-04-20 ENCOUNTER — Inpatient Hospital Stay: Payer: BLUE CROSS/BLUE SHIELD | Admitting: Family
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
## 2021-04-24 ENCOUNTER — Ambulatory Visit: Payer: BLUE CROSS/BLUE SHIELD | Admitting: Cardiology

## 2021-04-24 ENCOUNTER — Telehealth: Payer: Self-pay | Admitting: *Deleted

## 2021-04-24 DIAGNOSIS — I5023 Acute on chronic systolic (congestive) heart failure: Secondary | ICD-10-CM | POA: Diagnosis not present

## 2021-04-24 DIAGNOSIS — J181 Lobar pneumonia, unspecified organism: Secondary | ICD-10-CM | POA: Diagnosis not present

## 2021-04-24 DIAGNOSIS — N1832 Chronic kidney disease, stage 3b: Secondary | ICD-10-CM | POA: Diagnosis not present

## 2021-04-24 DIAGNOSIS — E1122 Type 2 diabetes mellitus with diabetic chronic kidney disease: Secondary | ICD-10-CM | POA: Diagnosis not present

## 2021-04-24 DIAGNOSIS — J440 Chronic obstructive pulmonary disease with acute lower respiratory infection: Secondary | ICD-10-CM | POA: Diagnosis not present

## 2021-04-24 DIAGNOSIS — I130 Hypertensive heart and chronic kidney disease with heart failure and stage 1 through stage 4 chronic kidney disease, or unspecified chronic kidney disease: Secondary | ICD-10-CM | POA: Diagnosis not present

## 2021-04-24 NOTE — Chronic Care Management (AMB) (Signed)
  Chronic Care Management   Outreach Note  04/24/2021 Name: Melissa [name] MRN: [id number] DOB: May 14, 1928  Melissa [name] is a 87 y.o. year old female who is a primary care patient of Copland, Gay Filler, MD. I reached out to Melissa [name] by phone today in response to a referral sent by Melissa [name]'s PCP, Copland, Gay Filler, MD.      A telephone outreach was attempted today not available to take call request a return call. The patient was referred to the case management team for assistance with care management and care coordination.   Follow Up Plan: The care management team will reach out to the patient again over the next 7 days.  If patient returns call to provider office, please advise to call [place] at [phone].  [place] Management

## 2021-04-25 ENCOUNTER — Ambulatory Visit: Payer: BLUE CROSS/BLUE SHIELD | Admitting: Physician Assistant

## 2021-04-26 ENCOUNTER — Other Ambulatory Visit: Payer: Self-pay

## 2021-04-26 ENCOUNTER — Ambulatory Visit (INDEPENDENT_AMBULATORY_CARE_PROVIDER_SITE_OTHER): Payer: Medicare Other | Admitting: Family Medicine

## 2021-04-26 VITALS — BP 118/70 | HR 99 | Temp 97.4°F | Resp 18 | Ht 64.0 in | Wt 156.8 lb

## 2021-04-26 DIAGNOSIS — Z09 Encounter for follow-up examination after completed treatment for conditions other than malignant neoplasm: Secondary | ICD-10-CM

## 2021-04-26 DIAGNOSIS — G250 Essential tremor: Secondary | ICD-10-CM | POA: Diagnosis not present

## 2021-04-26 DIAGNOSIS — I480 Paroxysmal atrial fibrillation: Secondary | ICD-10-CM | POA: Diagnosis not present

## 2021-04-26 DIAGNOSIS — N289 Disorder of kidney and ureter, unspecified: Secondary | ICD-10-CM
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
## 2021-04-26 NOTE — Patient Instructions (Addendum)
It was good to see you today!   Continue to monitor your weights daily If any unexpected weight gain of more than 2 lbs please let me or your cardiologist know   I will send your lab info to Richardson Dopp with cardiology- we can adjust your eliquis dose if needed

## 2021-04-27 ENCOUNTER — Encounter: Payer: Self-pay | Admitting: Family Medicine

## 2021-04-27 DIAGNOSIS — I130 Hypertensive heart and chronic kidney disease with heart failure and stage 1 through stage 4 chronic kidney disease, or unspecified chronic kidney disease: Secondary | ICD-10-CM | POA: Diagnosis not present

## 2021-04-27 DIAGNOSIS — N1832 Chronic kidney disease, stage 3b: Secondary | ICD-10-CM | POA: Diagnosis not present

## 2021-04-27 DIAGNOSIS — J181 Lobar pneumonia, unspecified organism: Secondary | ICD-10-CM | POA: Diagnosis not present

## 2021-04-27 DIAGNOSIS — I5023 Acute on chronic systolic (congestive) heart failure: Secondary | ICD-10-CM | POA: Diagnosis not present

## 2021-04-27 DIAGNOSIS — E1122 Type 2 diabetes mellitus with diabetic chronic kidney disease: Secondary | ICD-10-CM | POA: Diagnosis not present

## 2021-04-27 DIAGNOSIS — J440 Chronic obstructive pulmonary disease with acute lower respiratory infection: Secondary | ICD-10-CM | POA: Diagnosis not present

## 2021-04-27 LAB — BASIC METABOLIC PANEL
BUN: 28 mg/dL — ABNORMAL HIGH (ref 6–23)
CO2: 30 mEq/L (ref 19–32)
Calcium: 9 mg/dL (ref 8.4–10.5)
Chloride: 101 mEq/L (ref 96–112)
Creatinine, Ser: 1.55 mg/dL — ABNORMAL HIGH (ref 0.40–1.20)
GFR: 29.19 mL/min — ABNORMAL LOW (ref >60.00)
Glucose, Bld: 157 mg/dL — ABNORMAL HIGH (ref 70–99)
Potassium: 5 mEq/L (ref 3.5–5.1)
Sodium: 138 mEq/L (ref 135–145)

## 2021-04-27 NOTE — Chronic Care Management (AMB) (Signed)
  Chronic Care Management   Note  04/27/2021 Name: Melissa [name] MRN: [id number] DOB: 09/27/1928  Melissa [name] is a 87 y.o. year old female who is a primary care patient of Copland, Gay Filler, MD. I reached out to [name] by phone today in response to a referral sent by [name]'s PCP, Copland, Gay Filler, MD      [name] was given information about Chronic Care Management services today including:  CCM service includes personalized support from designated clinical staff supervised by her physician, including individualized plan of care and coordination with other care providers 24/7 contact phone numbers for assistance for urgent and routine care needs. Service will only be billed when office clinical staff spend 20 minutes or more in a month to coordinate care. Only one practitioner may furnish and bill the service in a calendar month. The patient may stop CCM services at any time (effective at the end of the month) by phone call to the office staff. The patient will be responsible for cost sharing (co-pay) of up to 20% of the service fee (after annual deductible is met).  Patient daughter Fransisco Hertz [facility] on file did not agree to enrollment in care management services and does not wish to consider at this time.  Follow up plan: Patient daughter Fransisco Hertz [name] on file declines further follow up and engagement by the care management team. Appropriate care team members and provider have been notified via electronic communication. The care management team is available to follow up with the patient after provider conversation with the patient regarding recommendation for care management engagement and subsequent re-referral to the care management team.   [facility]

## 2021-04-30 NOTE — Telephone Encounter (Signed)
Cancel labs tomorrow. Richardson Dopp, PA-C    04/30/2021 5:00 PM

## 2021-05-01 ENCOUNTER — Other Ambulatory Visit: Payer: BLUE CROSS/BLUE SHIELD

## 2021-05-01 DIAGNOSIS — E1122 Type 2 diabetes mellitus with diabetic chronic kidney disease: Secondary | ICD-10-CM | POA: Diagnosis not present

## 2021-05-01 DIAGNOSIS — I130 Hypertensive heart and chronic kidney disease with heart failure and stage 1 through stage 4 chronic kidney disease, or unspecified chronic kidney disease: Secondary | ICD-10-CM | POA: Diagnosis not present

## 2021-05-01 DIAGNOSIS — J440 Chronic obstructive pulmonary disease with acute lower respiratory infection: Secondary | ICD-10-CM | POA: Diagnosis not present

## 2021-05-01 DIAGNOSIS — I5023 Acute on chronic systolic (congestive) heart failure: Secondary | ICD-10-CM | POA: Diagnosis not present

## 2021-05-01 DIAGNOSIS — N1832 Chronic kidney disease, stage 3b: Secondary | ICD-10-CM | POA: Diagnosis not present

## 2021-05-01 DIAGNOSIS — J181 Lobar pneumonia, unspecified organism: Secondary | ICD-10-CM | POA: Diagnosis not present

## 2021-05-08 ENCOUNTER — Ambulatory Visit: Payer: Medicare Other | Admitting: Neurology

## 2021-05-08 ENCOUNTER — Encounter: Payer: Self-pay | Admitting: Family Medicine

## 2021-05-08 MED ORDER — SUCRALFATE 1 GM/10ML PO SUSP: 1.0000 g | Freq: Two times a day (BID) | ORAL | 0 refills | Status: DC

## 2021-05-08 NOTE — Addendum Note (Signed)
Addended by: Lamar Blinks C on: 05/08/2021 05:20 PM   Modules accepted: Orders
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
## 2021-05-09 DIAGNOSIS — E1122 Type 2 diabetes mellitus with diabetic chronic kidney disease: Secondary | ICD-10-CM | POA: Diagnosis not present

## 2021-05-09 DIAGNOSIS — J440 Chronic obstructive pulmonary disease with acute lower respiratory infection: Secondary | ICD-10-CM | POA: Diagnosis not present

## 2021-05-09 DIAGNOSIS — J181 Lobar pneumonia, unspecified organism: Secondary | ICD-10-CM | POA: Diagnosis not present

## 2021-05-09 DIAGNOSIS — I5023 Acute on chronic systolic (congestive) heart failure: Secondary | ICD-10-CM | POA: Diagnosis not present

## 2021-05-09 DIAGNOSIS — N1832 Chronic kidney disease, stage 3b: Secondary | ICD-10-CM | POA: Diagnosis not present

## 2021-05-09 DIAGNOSIS — I130 Hypertensive heart and chronic kidney disease with heart failure and stage 1 through stage 4 chronic kidney disease, or unspecified chronic kidney disease: Secondary | ICD-10-CM | POA: Diagnosis not present

## 2021-05-15 ENCOUNTER — Encounter: Payer: Self-pay | Admitting: Family Medicine

## 2021-05-16 ENCOUNTER — Other Ambulatory Visit: Payer: Self-pay

## 2021-05-16 ENCOUNTER — Other Ambulatory Visit: Payer: Medicare Other

## 2021-05-16 DIAGNOSIS — R8279 Other abnormal findings on microbiological examination of urine: Secondary | ICD-10-CM | POA: Diagnosis not present

## 2021-05-16 MED ORDER — CEPHALEXIN 500 MG PO CAPS: 500.0000 mg | ORAL_CAPSULE | Freq: Two times a day (BID) | ORAL | 0 refills | Status: AC

## 2021-05-17 LAB — URINE CULTURE
MICRO NUMBER:: 12196679
Result:: NO GROWTH
SPECIMEN QUALITY:: ADEQUATE
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
## 2021-05-18 ENCOUNTER — Ambulatory Visit: Payer: Medicare Other | Admitting: Physician Assistant

## 2021-05-18 ENCOUNTER — Other Ambulatory Visit: Payer: Self-pay

## 2021-05-18 ENCOUNTER — Encounter: Payer: Self-pay | Admitting: Physician Assistant

## 2021-05-18 ENCOUNTER — Encounter: Payer: Self-pay | Admitting: Family Medicine

## 2021-05-18 ENCOUNTER — Ambulatory Visit (INDEPENDENT_AMBULATORY_CARE_PROVIDER_SITE_OTHER): Payer: Medicare Other | Admitting: Physician Assistant

## 2021-05-18 VITALS — BP 118/62 | HR 100 | Ht 64.0 in | Wt 157.6 lb

## 2021-05-18 DIAGNOSIS — I4819 Other persistent atrial fibrillation: Secondary | ICD-10-CM | POA: Diagnosis not present

## 2021-05-18 DIAGNOSIS — I10 Essential (primary) hypertension: Secondary | ICD-10-CM

## 2021-05-18 DIAGNOSIS — I5020 Unspecified systolic (congestive) heart failure: Secondary | ICD-10-CM

## 2021-05-18 DIAGNOSIS — N1832 Chronic kidney disease, stage 3b: Secondary | ICD-10-CM

## 2021-05-18 MED ORDER — METOPROLOL TARTRATE 50 MG PO TABS: 75.0000 mg | ORAL_TABLET | Freq: Two times a day (BID) | ORAL | 3 refills | Status: DC

## 2021-05-18 NOTE — Patient Instructions (Signed)
Medication Instructions:   Your physician recommends that you continue on your current medications as directed. Please refer to the Current Medication list given to you today.  *If you need a refill on your cardiac medications before your next appointment, please call your pharmacy*   [facility]!!! BMET/CBC  If you have labs (blood work) drawn today and your tests are completely normal, you will receive your results only by: [place] (if you have MyChart) OR A paper copy in the mail If you have any lab test that is abnormal or we need to change your treatment, we will call you to review the results.   Testing/Procedures: Your provider has recommended a cardioversion.   You are scheduled for a cardioversion on Wednesday, August 10 at 12:00  with [name] or associates. Please go to [facility] ([address]) 2nd [place] Stay at 11:00.  There is free valet parking available.  Enter through the [place] not have any food or drink after midnight on Tuesday, August 9.  You may take your medicines with a sip of water on the day of your procedure.   Hold the following medicines Hold Lasix the am of cardioversion Do not take insulin in the am before cardioversion.  DO NOT STOP YOUR ANTICOAGULANT (BLOOD THINNER) Eliquis - YOU WILL NEED TO CONTINUE YOUR ANTICOAGULANT AFTER YOUR PROCEDURE UNTIL YOU ARE TOLD BY YOUR PROVIDER IT IS SAFE TO STOP  You will need someone to drive you home following your procedure and stay in the waiting room during your procedure. Failure to do so could result in your procedure being cancelled.   Every effort is made to have your procedure done on time. Occasionally there are emergencies that occur at the hospital that may cause delays.   Call the [place] office at [phone] if you have any questions, problems or concerns.     Electrical Cardioversion Electrical cardioversion is the  delivery of a jolt of electricity to change the rhythm of the heart. Sticky patches or metal paddles are placed on the chest to deliver the electricity from a device. This is done to restore a normal rhythm. A rhythm that is too fast or not regular keeps the heart from pumping well. Electrical cardioversion is done in an emergency if:  There is low or no blood pressure as a result of the heart rhythm.   Normal rhythm must be restored as fast as possible to protect the brain and heart from further damage.   It may save a life. Cardioversion may be done for heart rhythms that are not immediately life threatening, such as atrial fibrillation or flutter, in which:  The heart is beating too fast or is not regular.   Medicine to change the rhythm has not worked.   It is safe to wait in order to allow time for preparation. Symptoms of the abnormal rhythm are bothersome. The risk of stroke and other serious problems can be reduced.  LET [facility] CARE PROVIDER KNOW ABOUT:  Any allergies you have. All medicines you are taking, including vitamins, herbs, eye drops, creams, and over-the-counter medicines. Previous problems you or members of your family have had with the use of anesthetics.   Any blood disorders you have.   Previous surgeries you have had.   Medical conditions you have.   RISKS AND COMPLICATIONS  Generally, this is a safe procedure. However, problems can occur and include:  Breathing  problems related to the anesthetic used. A blood clot that breaks free and travels to other parts of your body. This could cause a stroke or other problems. The risk of this is lowered by use of blood-thinning medicine (anticoagulant) prior to the procedure. Cardiac arrest (rare).   BEFORE THE PROCEDURE  You may have tests to detect blood clots in your heart and to evaluate heart function.  You may start taking anticoagulants so your blood does not clot as easily.   Medicines may be given to help  stabilize your heart rate and rhythm.   PROCEDURE You will be given medicine through an IV tube to reduce discomfort and make you sleepy (sedative).   An electrical shock will be delivered.   AFTER THE PROCEDURE Your heart rhythm will be watched to make sure it does not change. You will need someone to drive you home.      Follow-Up: At [facility], you and your health needs are our priority.  As part of our continuing mission to provide you with exceptional heart care, we have created designated Provider Care Teams.  These Care Teams include your primary Cardiologist (physician) and Advanced Practice Providers (APPs -  Physician Assistants and Nurse Practitioners) who all work together to provide you with the care you need, when you need it.  We recommend signing up for the patient portal called "MyChart".  Sign up information is provided on this After Visit Summary.  MyChart is used to connect with patients for Virtual Visits (Telemedicine).  Patients are able to view lab/test results, encounter notes, upcoming appointments, etc.  Non-urgent messages can be sent to your provider as well.   To learn more about what you can do with MyChart, go to [email or web address].    Your next appointment:   3 month(s) with [name] on Friday, November 11 @ 3:20 pm.   The format for your next appointment:   In Person  Provider:   [name], MD   AFIB CLINIC INFORMATION: Your appointment is scheduled on: Thursday, August 25  at 3:00 pm. Please arrive 15 minutes early for check-in. The AFib Clinic is located in the Heart and Vascular Specialty Clinics at [facility]. Parking instructions/directions: [address] (off [address]). When you pull in to Entrance C, there is an underground parking garage to your right. The code to enter the garage is 5544. Take the elevators to the first floor. Follow the signs to the Heart and Vascular Specialty Clinics. You will see registration at  the end of the hallway.  Phone number: [phone]

## 2021-05-19 LAB — BASIC METABOLIC PANEL
BUN/Creatinine Ratio: 16 (ref 12–28)
BUN: 24 mg/dL (ref 10–36)
CO2: 28 mmol/L (ref 20–29)
Calcium: 8.6 mg/dL — ABNORMAL LOW (ref 8.7–10.3)
Chloride: 97 mmol/L (ref 96–106)
Creatinine, Ser: 1.46 mg/dL — ABNORMAL HIGH (ref 0.57–1.00)
Glucose: 291 mg/dL — ABNORMAL HIGH (ref 65–99)
Potassium: 4.3 mmol/L (ref 3.5–5.2)
Sodium: 139 mmol/L (ref 134–144)
eGFR: 34 mL/min/{1.73_m2} — ABNORMAL LOW (ref >59)

## 2021-05-19 LAB — CBC
Hematocrit: 36.3 % (ref 34.0–46.6)
Hemoglobin: 11.6 g/dL (ref 11.1–15.9)
MCH: 27 pg (ref 26.6–33.0)
MCHC: 32 g/dL (ref 31.5–35.7)
MCV: 85 fL (ref 79–97)
Platelets: 226 10*3/uL (ref 150–450)
RBC: 4.29 x10E6/uL (ref 3.77–5.28)
RDW: 12.7 % (ref 11.7–15.4)
WBC: 8 10*3/uL (ref 3.4–10.8)

## 2021-05-23 ENCOUNTER — Ambulatory Visit (HOSPITAL_COMMUNITY)
Admission: RE | Admit: 2021-05-23 | Discharge: 2021-05-23 | Disposition: A | Discharge status: Home / Self Care | Payer: Medicare Other | Attending: Cardiology | Admitting: Cardiology

## 2021-05-23 ENCOUNTER — Ambulatory Visit (HOSPITAL_COMMUNITY): Payer: Medicare Other | Admitting: Anesthesiology

## 2021-05-23 ENCOUNTER — Encounter (HOSPITAL_COMMUNITY)
Admission: RE | Disposition: A | Discharge status: Home / Self Care | Payer: Self-pay | Source: Home / Self Care | Attending: Cardiology

## 2021-05-23 ENCOUNTER — Encounter (HOSPITAL_COMMUNITY): Payer: Self-pay | Admitting: Cardiology

## 2021-05-23 ENCOUNTER — Other Ambulatory Visit: Payer: Self-pay

## 2021-05-23 DIAGNOSIS — Z87891 Personal history of nicotine dependence: Secondary | ICD-10-CM | POA: Insufficient documentation

## 2021-05-23 DIAGNOSIS — Z794 Long term (current) use of insulin: Secondary | ICD-10-CM | POA: Diagnosis not present

## 2021-05-23 DIAGNOSIS — E876 Hypokalemia: Secondary | ICD-10-CM | POA: Diagnosis not present

## 2021-05-23 DIAGNOSIS — I130 Hypertensive heart and chronic kidney disease with heart failure and stage 1 through stage 4 chronic kidney disease, or unspecified chronic kidney disease: Secondary | ICD-10-CM | POA: Diagnosis not present

## 2021-05-23 DIAGNOSIS — N1832 Chronic kidney disease, stage 3b: Secondary | ICD-10-CM | POA: Insufficient documentation

## 2021-05-23 DIAGNOSIS — I4819 Other persistent atrial fibrillation: Secondary | ICD-10-CM | POA: Diagnosis not present

## 2021-05-23 DIAGNOSIS — Z79890 Hormone replacement therapy (postmenopausal): Secondary | ICD-10-CM | POA: Insufficient documentation

## 2021-05-23 DIAGNOSIS — E7800 Pure hypercholesterolemia, unspecified: Secondary | ICD-10-CM | POA: Diagnosis not present

## 2021-05-23 DIAGNOSIS — E1122 Type 2 diabetes mellitus with diabetic chronic kidney disease: Secondary | ICD-10-CM | POA: Insufficient documentation

## 2021-05-23 DIAGNOSIS — I5022 Chronic systolic (congestive) heart failure: Secondary | ICD-10-CM | POA: Insufficient documentation

## 2021-05-23 DIAGNOSIS — I480 Paroxysmal atrial fibrillation: Secondary | ICD-10-CM | POA: Diagnosis not present

## 2021-05-23 DIAGNOSIS — E1165 Type 2 diabetes mellitus with hyperglycemia: Secondary | ICD-10-CM

## 2021-05-23 DIAGNOSIS — G629 Polyneuropathy, unspecified: Secondary | ICD-10-CM

## 2021-05-23 DIAGNOSIS — Z79899 Other long term (current) drug therapy: Secondary | ICD-10-CM | POA: Insufficient documentation

## 2021-05-23 DIAGNOSIS — I428 Other cardiomyopathies: Secondary | ICD-10-CM | POA: Insufficient documentation

## 2021-05-23 DIAGNOSIS — Z7901 Long term (current) use of anticoagulants: Secondary | ICD-10-CM | POA: Diagnosis not present

## 2021-05-23 DIAGNOSIS — M8580 Other specified disorders of bone density and structure, unspecified site: Secondary | ICD-10-CM | POA: Diagnosis not present

## 2021-05-23 HISTORY — PX: CARDIOVERSION: SHX1299

## 2021-05-23 LAB — GLUCOSE, CAPILLARY: Glucose-Capillary: 152 mg/dL — ABNORMAL HIGH (ref 70–99)

## 2021-05-23 SURGERY — CARDIOVERSION
Anesthesia: General

## 2021-05-23 MED ORDER — POLYETHYLENE GLYCOL 3350 17 G PO PACK: 17.0000 g | PACK | Freq: Every day | ORAL | 1 refills | Status: DC | PRN

## 2021-05-23 MED ORDER — PRAMIPEXOLE DIHYDROCHLORIDE 0.25 MG PO TABS: 0.2500 mg | ORAL_TABLET | Freq: Every day | ORAL | 0 refills | Status: DC

## 2021-05-23 MED ORDER — PREGABALIN 50 MG PO CAPS: 100.0000 mg | ORAL_CAPSULE | Freq: Every day | ORAL | 0 refills | Status: DC

## 2021-05-23 MED ORDER — LIDOCAINE 2% (20 MG/ML) 5 ML SYRINGE
INTRAMUSCULAR | Status: DC | PRN
  Administered 2021-05-23: 100 mg via INTRAVENOUS
  Administered 2021-05-23: 50 mg via INTRAVENOUS

## 2021-05-23 MED ORDER — LEVEMIR FLEXTOUCH 100 UNIT/ML ~~LOC~~ SOPN: 25.0000 [IU] | PEN_INJECTOR | Freq: Every day | SUBCUTANEOUS | 3 refills | Status: DC

## 2021-05-23 MED ORDER — OZEMPIC (0.25 OR 0.5 MG/DOSE) 2 MG/1.5ML ~~LOC~~ SOPN: 0.5000 mg | PEN_INJECTOR | SUBCUTANEOUS | 3 refills | Status: DC

## 2021-05-23 MED ORDER — SODIUM CHLORIDE 0.9 % IV SOLN: INTRAVENOUS | Status: DC | PRN

## 2021-05-23 MED ORDER — PROPOFOL 10 MG/ML IV BOLUS
INTRAVENOUS | Status: DC | PRN
  Administered 2021-05-23: 50 mg via INTRAVENOUS

## 2021-05-23 NOTE — Interval H&P Note (Signed)
History and Physical Interval Note:  05/23/2021 12:17 PM  Melissa [name]  has presented today for surgery, with the diagnosis of AFIB.  The various methods of treatment have been discussed with the patient and family. After consideration of risks, benefits and other options for treatment, the patient has consented to  Procedure(s): CARDIOVERSION (N/A) as a surgical intervention.  The patient's history has been reviewed, patient examined, no change in status, stable for surgery.  I have reviewed the patient's chart and labs.  Questions were answered to the patient's satisfaction.     [employer]

## 2021-05-23 NOTE — Transfer of Care (Signed)
Immediate Anesthesia Transfer of Care Note  Patient: Melissa [name]  Procedure(s) Performed: CARDIOVERSION  Patient Location: Endoscopy Unit  Anesthesia Type:General  Level of Consciousness: drowsy, patient cooperative and responds to stimulation  Airway & Oxygen Therapy: Patient Spontanous Breathing  Post-op Assessment: Report given to RN, Post -op Vital signs reviewed and stable and Patient moving all extremities  Post vital signs: Reviewed and stable  Last Vitals:  Vitals Value Taken Time  BP 94/46   Temp    Pulse 82   Resp 12   SpO2 95     Last Pain:  Vitals:   05/23/21 1116  TempSrc: Oral  PainSc: 0-No pain         Complications: No notable events documented.

## 2021-05-23 NOTE — Anesthesia Preprocedure Evaluation (Signed)
Anesthesia Evaluation  Patient identified by MRN, date of birth, ID band Patient awake    Reviewed: Allergy & Precautions, H&P , NPO status , Patient's Chart, lab work & pertinent test results, reviewed documented beta blocker date and time   Airway Mallampati: II  TM Distance: >3 FB Neck ROM: Full    Dental no notable dental hx. (+) Upper Dentures, Lower Dentures, Dental Advisory Given   Pulmonary asthma , former smoker,    Pulmonary exam normal breath sounds clear to auscultation       Cardiovascular hypertension, Pt. on medications and Pt. on home beta blockers +CHF  + dysrhythmias Atrial Fibrillation + Valvular Problems/Murmurs MR  Rhythm:Irregular Rate:Normal     Neuro/Psych  Headaches, Anxiety    GI/Hepatic Neg liver ROS, hiatal hernia, GERD  ,  Endo/Other  diabetesHypothyroidism   Renal/GU Renal disease  negative genitourinary   Musculoskeletal  (+) Arthritis , Osteoarthritis,    Abdominal   Peds  Hematology  (+) Blood dyscrasia, anemia ,   Anesthesia Other Findings   Reproductive/Obstetrics negative OB ROS                             Anesthesia Physical Anesthesia Plan  ASA: 4  Anesthesia Plan: General   Post-op Pain Management:    Induction: Intravenous  PONV Risk Score and Plan: 3 and Propofol infusion and Treatment may vary due to age or medical condition  Airway Management Planned: Mask  Additional Equipment:   Intra-op Plan:   Post-operative Plan:   Informed Consent: I have reviewed the patients History and Physical, chart, labs and discussed the procedure including the risks, benefits and alternatives for the proposed anesthesia with the patient or authorized representative who has indicated his/her understanding and acceptance.     Dental advisory given  Plan Discussed with: CRNA  Anesthesia Plan Comments:         Anesthesia Quick Evaluation

## 2021-05-23 NOTE — CV Procedure (Signed)
    Electrical Cardioversion Procedure Note Melissa [name] [id number] [birth year]  Procedure: Electrical Cardioversion Indications:  Atrial Fibrillation  Time Out: Verified patient identification, verified procedure,medications/allergies/relevent history reviewed, required imaging and test results available.  Performed  Procedure Details  The patient was NPO after midnight. Anesthesia was administered at the beside  by Dr.Oddono with propofol.  Cardioversion was performed with synchronized biphasic defibrillation via AP pads with 120 joules.  1 attempt(s) were performed.  The patient converted to normal sinus rhythm. The patient tolerated the procedure well   IMPRESSION:  Successful cardioversion of atrial fibrillation    Melissa [name] 05/23/2021, 12:33 PM

## 2021-05-23 NOTE — Anesthesia Procedure Notes (Addendum)
Procedure Name: General with mask airway Date/Time: 05/23/2021 12:19 PM Performed by: Rande Brunt, CRNA Pre-anesthesia Checklist: Emergency Drugs available, Suction available, Patient identified, Patient being monitored and Timeout performed Patient Re-evaluated:Patient Re-evaluated prior to induction Oxygen Delivery Method: Ambu bag Preoxygenation: Pre-oxygenation with 100% oxygen Induction Type: IV induction Placement Confirmation: CO2 detector and positive ETCO2 Dental Injury: Teeth and Oropharynx as per pre-operative assessment

## 2021-05-23 NOTE — Anesthesia Postprocedure Evaluation (Signed)
Anesthesia Post Note  Patient: Melissa [name]  Procedure(s) Performed: CARDIOVERSION     Patient location during evaluation: PACU Anesthesia Type: General Level of consciousness: awake and alert Pain management: pain level controlled Vital Signs Assessment: post-procedure vital signs reviewed and stable Respiratory status: spontaneous breathing, nonlabored ventilation, respiratory function stable and patient connected to nasal cannula oxygen Cardiovascular status: blood pressure returned to baseline and stable Postop Assessment: no apparent nausea or vomiting Anesthetic complications: no   No notable events documented.  Last Vitals:  Vitals:   05/23/21 1250 05/23/21 1300  BP: (!) 110/55 (!) 125/59  Pulse:  81  Resp:  14  Temp:    SpO2:  96%    Last Pain:  Vitals:   05/23/21 1300  TempSrc:   PainSc: 0-No pain                 Fabrizio Filip

## 2021-05-24 ENCOUNTER — Encounter (HOSPITAL_COMMUNITY): Payer: Self-pay | Admitting: Cardiology

## 2021-06-07 ENCOUNTER — Other Ambulatory Visit: Payer: Self-pay

## 2021-06-07 ENCOUNTER — Ambulatory Visit (HOSPITAL_COMMUNITY)
Admission: RE | Admit: 2021-06-07 | Discharge: 2021-06-07 | Disposition: A | Discharge status: Home / Self Care | Payer: Medicare Other | Source: Ambulatory Visit | Attending: Physician Assistant | Admitting: Physician Assistant

## 2021-06-07 VITALS — BP 104/56 | HR 83 | Ht 64.0 in | Wt 157.4 lb

## 2021-06-07 DIAGNOSIS — N183 Chronic kidney disease, stage 3 unspecified: Secondary | ICD-10-CM | POA: Diagnosis not present

## 2021-06-07 DIAGNOSIS — Z7901 Long term (current) use of anticoagulants: Secondary | ICD-10-CM | POA: Diagnosis not present

## 2021-06-07 DIAGNOSIS — Z87891 Personal history of nicotine dependence: Secondary | ICD-10-CM | POA: Insufficient documentation

## 2021-06-07 DIAGNOSIS — Z794 Long term (current) use of insulin: Secondary | ICD-10-CM | POA: Diagnosis not present

## 2021-06-07 DIAGNOSIS — E785 Hyperlipidemia, unspecified: Secondary | ICD-10-CM | POA: Insufficient documentation

## 2021-06-07 DIAGNOSIS — Z79899 Other long term (current) drug therapy: Secondary | ICD-10-CM | POA: Insufficient documentation

## 2021-06-07 DIAGNOSIS — D6869 Other thrombophilia: Secondary | ICD-10-CM | POA: Diagnosis not present

## 2021-06-07 DIAGNOSIS — I4819 Other persistent atrial fibrillation: Secondary | ICD-10-CM | POA: Diagnosis not present

## 2021-06-07 DIAGNOSIS — I5022 Chronic systolic (congestive) heart failure: Secondary | ICD-10-CM | POA: Diagnosis not present

## 2021-06-07 DIAGNOSIS — Z8249 Family history of ischemic heart disease and other diseases of the circulatory system: Secondary | ICD-10-CM | POA: Diagnosis not present

## 2021-06-07 DIAGNOSIS — Z8673 Personal history of transient ischemic attack (TIA), and cerebral infarction without residual deficits: Secondary | ICD-10-CM | POA: Diagnosis not present

## 2021-06-07 DIAGNOSIS — I130 Hypertensive heart and chronic kidney disease with heart failure and stage 1 through stage 4 chronic kidney disease, or unspecified chronic kidney disease: Secondary | ICD-10-CM | POA: Insufficient documentation

## 2021-06-07 DIAGNOSIS — I700 Atherosclerosis of aorta: Secondary | ICD-10-CM | POA: Diagnosis not present

## 2021-06-07 DIAGNOSIS — E1122 Type 2 diabetes mellitus with diabetic chronic kidney disease: Secondary | ICD-10-CM | POA: Diagnosis not present
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
## 2021-06-14 ENCOUNTER — Other Ambulatory Visit: Payer: Self-pay | Admitting: Cardiology

## 2021-06-14 NOTE — Telephone Encounter (Signed)
Prescription refill request for Eliquis received. Indication: Afib  Last office visit: 05/18/21 Kathlen Mody, PA)  Scr: 1.55 (04/26/21)  Age: 87 Weight: 71.4kg  Appropriate dose and refill sent to requested pharmacy.

## 2021-06-19 ENCOUNTER — Other Ambulatory Visit: Payer: Medicare Other

## 2021-06-19 ENCOUNTER — Encounter: Payer: Self-pay | Admitting: Family Medicine

## 2021-06-19 ENCOUNTER — Other Ambulatory Visit: Payer: Self-pay

## 2021-06-19 DIAGNOSIS — R300 Dysuria: Secondary | ICD-10-CM

## 2021-06-19 MED ORDER — CEPHALEXIN 500 MG PO CAPS: 500.0000 mg | ORAL_CAPSULE | Freq: Two times a day (BID) | ORAL | 0 refills | Status: DC

## 2021-06-19 NOTE — Telephone Encounter (Signed)
Patients daughter dropped off sample to lab.

## 2021-06-20 ENCOUNTER — Encounter: Payer: Self-pay | Admitting: Family Medicine

## 2021-06-20 LAB — URINE CULTURE
MICRO NUMBER:: 12335295
SPECIMEN QUALITY:: ADEQUATE
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
## 2021-06-27 ENCOUNTER — Ambulatory Visit: Payer: Medicare Other | Admitting: Family Medicine

## 2021-06-27 ENCOUNTER — Other Ambulatory Visit: Payer: Self-pay | Admitting: Family Medicine

## 2021-07-13 ENCOUNTER — Encounter: Payer: Self-pay | Admitting: Neurology

## 2021-07-13 ENCOUNTER — Other Ambulatory Visit: Payer: Self-pay | Admitting: Neurology

## 2021-07-13 ENCOUNTER — Ambulatory Visit (INDEPENDENT_AMBULATORY_CARE_PROVIDER_SITE_OTHER): Payer: Medicare Other | Admitting: Neurology

## 2021-07-13 VITALS — BP 106/59 | HR 76 | Ht 64.0 in | Wt 157.4 lb

## 2021-07-13 DIAGNOSIS — G2581 Restless legs syndrome: Secondary | ICD-10-CM | POA: Diagnosis not present

## 2021-07-13 DIAGNOSIS — F22 Delusional disorders: Secondary | ICD-10-CM | POA: Diagnosis not present

## 2021-07-13 DIAGNOSIS — G250 Essential tremor: Secondary | ICD-10-CM

## 2021-07-13 HISTORY — DX: Delusional disorders: F22

## 2021-07-13 MED ORDER — QUETIAPINE FUMARATE 50 MG PO TABS: ORAL_TABLET | ORAL | 3 refills | Status: DC

## 2021-07-13 NOTE — Patient Instructions (Addendum)
We will stop the Keppra.  Reduce the Mirapex by .025 mg every week until off the medication.  First, stop the 5 pm dose of mirapex 0.25 mg  After one week, reduce the evening dose for 0.75 mg to 0.5 mg for one week then go to 0.25 mg at night for one week, then stop the mirapex.  We will start Seroquel 50 mg at night for 2 weeks, then go to 100 mg at night.   In the future we will stop the lyrica as well.
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
## 2021-07-16 ENCOUNTER — Other Ambulatory Visit: Payer: Self-pay | Admitting: Family Medicine

## 2021-08-05 ENCOUNTER — Encounter: Payer: Self-pay | Admitting: Family Medicine

## 2021-08-05 DIAGNOSIS — R300 Dysuria: Secondary | ICD-10-CM

## 2021-08-05 MED ORDER — CEPHALEXIN 500 MG PO CAPS: 500.0000 mg | ORAL_CAPSULE | Freq: Two times a day (BID) | ORAL | 0 refills | Status: DC

## 2021-08-07 ENCOUNTER — Other Ambulatory Visit: Payer: Self-pay | Admitting: Neurology

## 2021-08-07 ENCOUNTER — Other Ambulatory Visit: Payer: Medicare Other

## 2021-08-07 DIAGNOSIS — R300 Dysuria: Secondary | ICD-10-CM | POA: Diagnosis not present

## 2021-08-08 LAB — URINE CULTURE
MICRO NUMBER:: 12547970
SPECIMEN QUALITY:: ADEQUATE

## 2021-08-09 ENCOUNTER — Encounter: Payer: Self-pay | Admitting: Family Medicine

## 2021-08-14 ENCOUNTER — Inpatient Hospital Stay (HOSPITAL_COMMUNITY): Payer: Medicare Other

## 2021-08-14 ENCOUNTER — Emergency Department (HOSPITAL_COMMUNITY): Payer: Medicare Other

## 2021-08-14 ENCOUNTER — Inpatient Hospital Stay (HOSPITAL_COMMUNITY)
Admission: EM | Admit: 2021-08-14 | Discharge: 2021-08-18 | DRG: 689 | Disposition: A | Discharge status: Home Health | Payer: Medicare Other | Attending: Family Medicine | Admitting: Family Medicine

## 2021-08-14 ENCOUNTER — Encounter (HOSPITAL_COMMUNITY): Payer: Self-pay

## 2021-08-14 DIAGNOSIS — Z9841 Cataract extraction status, right eye: Secondary | ICD-10-CM

## 2021-08-14 DIAGNOSIS — I6522 Occlusion and stenosis of left carotid artery: Secondary | ICD-10-CM | POA: Diagnosis not present

## 2021-08-14 DIAGNOSIS — Z7901 Long term (current) use of anticoagulants: Secondary | ICD-10-CM | POA: Diagnosis not present

## 2021-08-14 DIAGNOSIS — Z823 Family history of stroke: Secondary | ICD-10-CM

## 2021-08-14 DIAGNOSIS — E1142 Type 2 diabetes mellitus with diabetic polyneuropathy: Secondary | ICD-10-CM | POA: Diagnosis present

## 2021-08-14 DIAGNOSIS — I428 Other cardiomyopathies: Secondary | ICD-10-CM | POA: Diagnosis present

## 2021-08-14 DIAGNOSIS — I10 Essential (primary) hypertension: Secondary | ICD-10-CM | POA: Diagnosis present

## 2021-08-14 DIAGNOSIS — M545 Low back pain, unspecified: Secondary | ICD-10-CM | POA: Diagnosis not present

## 2021-08-14 DIAGNOSIS — Z8673 Personal history of transient ischemic attack (TIA), and cerebral infarction without residual deficits: Secondary | ICD-10-CM

## 2021-08-14 DIAGNOSIS — Z743 Need for continuous supervision: Secondary | ICD-10-CM | POA: Diagnosis not present

## 2021-08-14 DIAGNOSIS — N184 Chronic kidney disease, stage 4 (severe): Secondary | ICD-10-CM | POA: Diagnosis present

## 2021-08-14 DIAGNOSIS — R9431 Abnormal electrocardiogram [ECG] [EKG]: Secondary | ICD-10-CM | POA: Diagnosis not present

## 2021-08-14 DIAGNOSIS — Z1619 Resistance to other specified beta lactam antibiotics: Secondary | ICD-10-CM | POA: Diagnosis present

## 2021-08-14 DIAGNOSIS — E538 Deficiency of other specified B group vitamins: Secondary | ICD-10-CM | POA: Diagnosis present

## 2021-08-14 DIAGNOSIS — W19XXXA Unspecified fall, initial encounter: Secondary | ICD-10-CM

## 2021-08-14 DIAGNOSIS — R531 Weakness: Secondary | ICD-10-CM | POA: Diagnosis not present

## 2021-08-14 DIAGNOSIS — G250 Essential tremor: Secondary | ICD-10-CM | POA: Diagnosis present

## 2021-08-14 DIAGNOSIS — Z90710 Acquired absence of both cervix and uterus: Secondary | ICD-10-CM

## 2021-08-14 DIAGNOSIS — S0990XA Unspecified injury of head, initial encounter: Secondary | ICD-10-CM | POA: Diagnosis not present

## 2021-08-14 DIAGNOSIS — Z809 Family history of malignant neoplasm, unspecified: Secondary | ICD-10-CM

## 2021-08-14 DIAGNOSIS — F22 Delusional disorders: Secondary | ICD-10-CM | POA: Diagnosis present

## 2021-08-14 DIAGNOSIS — S199XXA Unspecified injury of neck, initial encounter: Secondary | ICD-10-CM | POA: Diagnosis not present

## 2021-08-14 DIAGNOSIS — I639 Cerebral infarction, unspecified: Secondary | ICD-10-CM | POA: Diagnosis not present

## 2021-08-14 DIAGNOSIS — E039 Hypothyroidism, unspecified: Secondary | ICD-10-CM | POA: Diagnosis present

## 2021-08-14 DIAGNOSIS — N183 Chronic kidney disease, stage 3 unspecified: Secondary | ICD-10-CM | POA: Diagnosis present

## 2021-08-14 DIAGNOSIS — I5022 Chronic systolic (congestive) heart failure: Secondary | ICD-10-CM | POA: Diagnosis present

## 2021-08-14 DIAGNOSIS — E1140 Type 2 diabetes mellitus with diabetic neuropathy, unspecified: Secondary | ICD-10-CM

## 2021-08-14 DIAGNOSIS — Z87891 Personal history of nicotine dependence: Secondary | ICD-10-CM

## 2021-08-14 DIAGNOSIS — Z794 Long term (current) use of insulin: Secondary | ICD-10-CM | POA: Diagnosis not present

## 2021-08-14 DIAGNOSIS — R296 Repeated falls: Secondary | ICD-10-CM | POA: Diagnosis present

## 2021-08-14 DIAGNOSIS — K219 Gastro-esophageal reflux disease without esophagitis: Secondary | ICD-10-CM | POA: Diagnosis present

## 2021-08-14 DIAGNOSIS — J449 Chronic obstructive pulmonary disease, unspecified: Secondary | ICD-10-CM | POA: Diagnosis present

## 2021-08-14 DIAGNOSIS — I130 Hypertensive heart and chronic kidney disease with heart failure and stage 1 through stage 4 chronic kidney disease, or unspecified chronic kidney disease: Secondary | ICD-10-CM | POA: Diagnosis present

## 2021-08-14 DIAGNOSIS — Z79899 Other long term (current) drug therapy: Secondary | ICD-10-CM

## 2021-08-14 DIAGNOSIS — N179 Acute kidney failure, unspecified: Secondary | ICD-10-CM | POA: Diagnosis present

## 2021-08-14 DIAGNOSIS — E785 Hyperlipidemia, unspecified: Secondary | ICD-10-CM | POA: Diagnosis present

## 2021-08-14 DIAGNOSIS — Z20822 Contact with and (suspected) exposure to covid-19: Secondary | ICD-10-CM | POA: Diagnosis present

## 2021-08-14 DIAGNOSIS — I480 Paroxysmal atrial fibrillation: Secondary | ICD-10-CM | POA: Diagnosis present

## 2021-08-14 DIAGNOSIS — R519 Headache, unspecified: Secondary | ICD-10-CM | POA: Diagnosis not present

## 2021-08-14 DIAGNOSIS — Z043 Encounter for examination and observation following other accident: Secondary | ICD-10-CM | POA: Diagnosis not present

## 2021-08-14 DIAGNOSIS — N1832 Chronic kidney disease, stage 3b: Secondary | ICD-10-CM | POA: Diagnosis not present

## 2021-08-14 DIAGNOSIS — G4489 Other headache syndrome: Secondary | ICD-10-CM | POA: Diagnosis not present

## 2021-08-14 DIAGNOSIS — N390 Urinary tract infection, site not specified: Secondary | ICD-10-CM | POA: Diagnosis present

## 2021-08-14 DIAGNOSIS — M546 Pain in thoracic spine: Secondary | ICD-10-CM | POA: Diagnosis not present

## 2021-08-14 DIAGNOSIS — G2581 Restless legs syndrome: Secondary | ICD-10-CM | POA: Diagnosis present

## 2021-08-14 DIAGNOSIS — R6889 Other general symptoms and signs: Secondary | ICD-10-CM | POA: Diagnosis not present

## 2021-08-14 DIAGNOSIS — I6529 Occlusion and stenosis of unspecified carotid artery: Secondary | ICD-10-CM | POA: Diagnosis not present

## 2021-08-14 DIAGNOSIS — Z9842 Cataract extraction status, left eye: Secondary | ICD-10-CM

## 2021-08-14 DIAGNOSIS — G319 Degenerative disease of nervous system, unspecified: Secondary | ICD-10-CM | POA: Diagnosis not present

## 2021-08-14 DIAGNOSIS — E7800 Pure hypercholesterolemia, unspecified: Secondary | ICD-10-CM | POA: Diagnosis present

## 2021-08-14 DIAGNOSIS — Z9049 Acquired absence of other specified parts of digestive tract: Secondary | ICD-10-CM

## 2021-08-14 DIAGNOSIS — R739 Hyperglycemia, unspecified: Secondary | ICD-10-CM | POA: Diagnosis not present

## 2021-08-14 DIAGNOSIS — R0902 Hypoxemia: Secondary | ICD-10-CM | POA: Diagnosis not present

## 2021-08-14 DIAGNOSIS — R42 Dizziness and giddiness: Secondary | ICD-10-CM | POA: Diagnosis not present

## 2021-08-14 DIAGNOSIS — F419 Anxiety disorder, unspecified: Secondary | ICD-10-CM | POA: Diagnosis present

## 2021-08-14 DIAGNOSIS — G9341 Metabolic encephalopathy: Secondary | ICD-10-CM | POA: Diagnosis present

## 2021-08-14 DIAGNOSIS — I2510 Atherosclerotic heart disease of native coronary artery without angina pectoris: Secondary | ICD-10-CM | POA: Diagnosis not present

## 2021-08-14 DIAGNOSIS — Z8249 Family history of ischemic heart disease and other diseases of the circulatory system: Secondary | ICD-10-CM

## 2021-08-14 DIAGNOSIS — E1122 Type 2 diabetes mellitus with diabetic chronic kidney disease: Secondary | ICD-10-CM | POA: Diagnosis present

## 2021-08-14 DIAGNOSIS — Z79890 Hormone replacement therapy (postmenopausal): Secondary | ICD-10-CM

## 2021-08-14 LAB — URINALYSIS, ROUTINE W REFLEX MICROSCOPIC
Bacteria, UA: NONE SEEN
Bilirubin Urine: NEGATIVE
Glucose, UA: 150 mg/dL — AB
Hgb urine dipstick: NEGATIVE
Ketones, ur: NEGATIVE mg/dL
Nitrite: NEGATIVE
Protein, ur: NEGATIVE mg/dL
Specific Gravity, Urine: 1.009 (ref 1.005–1.030)
pH: 6 (ref 5.0–8.0)

## 2021-08-14 LAB — I-STAT CHEM 8, ED
BUN: 35 mg/dL — ABNORMAL HIGH (ref 8–23)
Calcium, Ion: 1.11 mmol/L — ABNORMAL LOW (ref 1.15–1.40)
Chloride: 96 mmol/L — ABNORMAL LOW (ref 98–111)
Creatinine, Ser: 1.7 mg/dL — ABNORMAL HIGH (ref 0.44–1.00)
Glucose, Bld: 291 mg/dL — ABNORMAL HIGH (ref 70–99)
HCT: 43 % (ref 36.0–46.0)
Hemoglobin: 14.6 g/dL (ref 12.0–15.0)
Potassium: 4.7 mmol/L (ref 3.5–5.1)
Sodium: 137 mmol/L (ref 135–145)
TCO2: 31 mmol/L (ref 22–32)

## 2021-08-14 LAB — TROPONIN I (HIGH SENSITIVITY)
Troponin I (High Sensitivity): 6 ng/L (ref <18)
Troponin I (High Sensitivity): 7 ng/L (ref <18)

## 2021-08-14 LAB — CBC
HCT: 43.6 % (ref 36.0–46.0)
HCT: 42.7 % (ref 36.0–46.0)
Hemoglobin: 13.6 g/dL (ref 12.0–15.0)
Hemoglobin: 13.8 g/dL (ref 12.0–15.0)
MCH: 26.9 pg (ref 26.0–34.0)
MCH: 27.4 pg (ref 26.0–34.0)
MCHC: 31.2 g/dL (ref 30.0–36.0)
MCHC: 32.3 g/dL (ref 30.0–36.0)
MCV: 86.3 fL (ref 80.0–100.0)
MCV: 84.7 fL (ref 80.0–100.0)
Platelets: 203 10*3/uL (ref 150–400)
Platelets: 186 10*3/uL (ref 150–400)
RBC: 5.05 MIL/uL (ref 3.87–5.11)
RBC: 5.04 MIL/uL (ref 3.87–5.11)
RDW: 14.3 % (ref 11.5–15.5)
RDW: 14.1 % (ref 11.5–15.5)
WBC: 9.7 10*3/uL (ref 4.0–10.5)
WBC: 8.3 10*3/uL (ref 4.0–10.5)
nRBC: 0 % (ref 0.0–0.2)
nRBC: 0 % (ref 0.0–0.2)

## 2021-08-14 LAB — HEMOGLOBIN A1C
Hgb A1c MFr Bld: 8.7 % — ABNORMAL HIGH (ref 4.8–5.6)
Mean Plasma Glucose: 202.99 mg/dL

## 2021-08-14 LAB — COMPREHENSIVE METABOLIC PANEL
ALT: 13 U/L (ref 0–44)
AST: 19 U/L (ref 15–41)
Albumin: 3.6 g/dL (ref 3.5–5.0)
Alkaline Phosphatase: 71 U/L (ref 38–126)
Anion gap: 8 (ref 5–15)
BUN: 33 mg/dL — ABNORMAL HIGH (ref 8–23)
CO2: 31 mmol/L (ref 22–32)
Calcium: 9.1 mg/dL (ref 8.9–10.3)
Chloride: 96 mmol/L — ABNORMAL LOW (ref 98–111)
Creatinine, Ser: 1.67 mg/dL — ABNORMAL HIGH (ref 0.44–1.00)
GFR, Estimated: 29 mL/min — ABNORMAL LOW (ref >60)
Glucose, Bld: 295 mg/dL — ABNORMAL HIGH (ref 70–99)
Potassium: 4.8 mmol/L (ref 3.5–5.1)
Sodium: 135 mmol/L (ref 135–145)
Total Bilirubin: 0.4 mg/dL (ref 0.3–1.2)
Total Protein: 7 g/dL (ref 6.5–8.1)

## 2021-08-14 LAB — CBG MONITORING, ED: Glucose-Capillary: 176 mg/dL — ABNORMAL HIGH (ref 70–99)

## 2021-08-14 LAB — LACTIC ACID, PLASMA: Lactic Acid, Venous: 1.4 mmol/L (ref 0.5–1.9)

## 2021-08-14 LAB — ETHANOL: Alcohol, Ethyl (B): <10 mg/dL (ref <10)

## 2021-08-14 LAB — PROTIME-INR
INR: 1.1 (ref 0.8–1.2)
Prothrombin Time: 14.5 seconds (ref 11.4–15.2)

## 2021-08-14 LAB — BRAIN NATRIURETIC PEPTIDE: B Natriuretic Peptide: 155.5 pg/mL — ABNORMAL HIGH (ref 0.0–100.0)

## 2021-08-14 LAB — RESP PANEL BY RT-PCR (FLU A&B, COVID) ARPGX2
Influenza A by PCR: NEGATIVE
Influenza B by PCR: NEGATIVE
SARS Coronavirus 2 by RT PCR: NEGATIVE

## 2021-08-14 LAB — SAMPLE TO BLOOD BANK

## 2021-08-14 LAB — CREATININE, SERUM
Creatinine, Ser: 1.62 mg/dL — ABNORMAL HIGH (ref 0.44–1.00)
GFR, Estimated: 30 mL/min — ABNORMAL LOW (ref >60)

## 2021-08-14 LAB — TSH: TSH: 5.033 u[IU]/mL — ABNORMAL HIGH (ref 0.350–4.500)

## 2021-08-14 MED ORDER — ACETAMINOPHEN 325 MG PO TABS
650.0000 mg | ORAL_TABLET | Freq: Four times a day (QID) | ORAL | Status: DC | PRN
  Administered 2021-08-15 – 2021-08-17 (×4): 650 mg via ORAL
  Filled 2021-08-14 (×4): qty 2

## 2021-08-14 MED ORDER — GADOBUTROL 1 MMOL/ML IV SOLN
7.4000 mL | Freq: Once | INTRAVENOUS | Status: AC | PRN
  Administered 2021-08-14: 7.4 mL via INTRAVENOUS

## 2021-08-14 MED ORDER — SENNA 8.6 MG PO TABS
1.0000 | ORAL_TABLET | Freq: Two times a day (BID) | ORAL | Status: DC
  Administered 2021-08-14 – 2021-08-18 (×7): 8.6 mg via ORAL
  Filled 2021-08-14 (×8): qty 1

## 2021-08-14 MED ORDER — SODIUM CHLORIDE 0.9 % IV SOLN
1.0000 g | INTRAVENOUS | Status: DC
  Administered 2021-08-14 – 2021-08-16 (×3): 1 g via INTRAVENOUS
  Filled 2021-08-14 (×3): qty 10

## 2021-08-14 MED ORDER — PRAMIPEXOLE DIHYDROCHLORIDE 0.25 MG PO TABS
0.2500 mg | ORAL_TABLET | Freq: Every day | ORAL | Status: DC
  Administered 2021-08-14 – 2021-08-17 (×4): 0.25 mg via ORAL
  Filled 2021-08-14 (×5): qty 1

## 2021-08-14 MED ORDER — PREGABALIN 100 MG PO CAPS
100.0000 mg | ORAL_CAPSULE | Freq: Every day | ORAL | Status: DC
  Administered 2021-08-14 – 2021-08-17 (×4): 100 mg via ORAL
  Filled 2021-08-14 (×4): qty 1

## 2021-08-14 MED ORDER — SODIUM CHLORIDE 0.9 % IV SOLN: INTRAVENOUS | Status: DC

## 2021-08-14 MED ORDER — INSULIN GLARGINE-YFGN 100 UNIT/ML ~~LOC~~ SOLN
20.0000 [IU] | Freq: Every day | SUBCUTANEOUS | Status: DC
  Administered 2021-08-14 – 2021-08-17 (×4): 20 [IU] via SUBCUTANEOUS
  Filled 2021-08-14 (×5): qty 0.2

## 2021-08-14 MED ORDER — ACETAMINOPHEN 650 MG RE SUPP: 650.0000 mg | Freq: Four times a day (QID) | RECTAL | Status: DC | PRN

## 2021-08-14 MED ORDER — DOCUSATE SODIUM 100 MG PO CAPS
100.0000 mg | ORAL_CAPSULE | Freq: Two times a day (BID) | ORAL | Status: DC
  Administered 2021-08-14 – 2021-08-18 (×7): 100 mg via ORAL
  Filled 2021-08-14 (×8): qty 1

## 2021-08-14 MED ORDER — PANTOPRAZOLE SODIUM 40 MG PO TBEC
40.0000 mg | DELAYED_RELEASE_TABLET | Freq: Two times a day (BID) | ORAL | Status: DC
  Administered 2021-08-14 – 2021-08-18 (×8): 40 mg via ORAL
  Filled 2021-08-14 (×8): qty 1

## 2021-08-14 MED ORDER — LACTATED RINGERS IV BOLUS: 500.0000 mL | Freq: Once | INTRAVENOUS | Status: DC

## 2021-08-14 MED ORDER — ACETAMINOPHEN 325 MG PO TABS
650.0000 mg | ORAL_TABLET | Freq: Once | ORAL | Status: AC
  Administered 2021-08-14: 650 mg via ORAL
  Filled 2021-08-14: qty 2

## 2021-08-14 MED ORDER — ONDANSETRON HCL 4 MG PO TABS: 4.0000 mg | ORAL_TABLET | Freq: Four times a day (QID) | ORAL | Status: DC | PRN

## 2021-08-14 MED ORDER — INSULIN DETEMIR 100 UNIT/ML FLEXPEN: 20.0000 [IU] | PEN_INJECTOR | Freq: Every day | SUBCUTANEOUS | Status: DC

## 2021-08-14 MED ORDER — INSULIN ASPART 100 UNIT/ML IJ SOLN
0.0000 [IU] | Freq: Three times a day (TID) | INTRAMUSCULAR | Status: DC
  Administered 2021-08-15: 5 [IU] via SUBCUTANEOUS
  Administered 2021-08-15: 3 [IU] via SUBCUTANEOUS
  Administered 2021-08-16: 2 [IU] via SUBCUTANEOUS
  Administered 2021-08-16 (×2): 5 [IU] via SUBCUTANEOUS
  Administered 2021-08-17 (×2): 3 [IU] via SUBCUTANEOUS
  Administered 2021-08-18 (×2): 2 [IU] via SUBCUTANEOUS

## 2021-08-14 MED ORDER — FLUOXETINE HCL 20 MG PO CAPS
40.0000 mg | ORAL_CAPSULE | Freq: Every day | ORAL | Status: DC
  Administered 2021-08-14 – 2021-08-18 (×5): 40 mg via ORAL
  Filled 2021-08-14 (×5): qty 2

## 2021-08-14 MED ORDER — ONDANSETRON HCL 4 MG/2ML IJ SOLN: 4.0000 mg | Freq: Four times a day (QID) | INTRAMUSCULAR | Status: DC | PRN

## 2021-08-14 MED ORDER — FUROSEMIDE 20 MG PO TABS: 40.0000 mg | ORAL_TABLET | Freq: Every day | ORAL | Status: DC

## 2021-08-14 MED ORDER — ACETAMINOPHEN 500 MG PO TABS: 500.0000 mg | ORAL_TABLET | Freq: Four times a day (QID) | ORAL | Status: DC | PRN

## 2021-08-14 MED ORDER — HEPARIN SODIUM (PORCINE) 5000 UNIT/ML IJ SOLN
5000.0000 [IU] | Freq: Three times a day (TID) | INTRAMUSCULAR | Status: DC
  Administered 2021-08-14 – 2021-08-15 (×3): 5000 [IU] via SUBCUTANEOUS
  Filled 2021-08-14 (×3): qty 1

## 2021-08-14 MED ORDER — ATORVASTATIN CALCIUM 40 MG PO TABS
40.0000 mg | ORAL_TABLET | Freq: Every day | ORAL | Status: DC
  Administered 2021-08-14 – 2021-08-18 (×5): 40 mg via ORAL
  Filled 2021-08-14 (×5): qty 1

## 2021-08-14 MED ORDER — LEVOTHYROXINE SODIUM 50 MCG PO TABS
50.0000 ug | ORAL_TABLET | Freq: Every day | ORAL | Status: DC
  Administered 2021-08-15 – 2021-08-18 (×4): 50 ug via ORAL
  Filled 2021-08-14: qty 1
  Filled 2021-08-14: qty 2
  Filled 2021-08-14 (×2): qty 1

## 2021-08-14 MED ORDER — METOPROLOL TARTRATE 50 MG PO TABS
75.0000 mg | ORAL_TABLET | Freq: Two times a day (BID) | ORAL | Status: DC
  Administered 2021-08-14 – 2021-08-18 (×8): 75 mg via ORAL
  Filled 2021-08-14 (×2): qty 1
  Filled 2021-08-14: qty 3
  Filled 2021-08-14: qty 1
  Filled 2021-08-14: qty 3
  Filled 2021-08-14 (×3): qty 1

## 2021-08-14 MED ORDER — LACTATED RINGERS IV BOLUS
250.0000 mL | Freq: Once | INTRAVENOUS | Status: AC
  Administered 2021-08-14: 250 mL via INTRAVENOUS
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
## 2021-08-14 NOTE — ED Notes (Addendum)
Pt noted to have intermittent confusion. 1 hour prior pt was disoriented to time and appeared somnolent, now pt is A/Ox4. Confusion seems to wax and wane.

## 2021-08-14 NOTE — ED Notes (Signed)
Purewick charge voucher in Paula's office with pt sticker.  

## 2021-08-14 NOTE — ED Notes (Signed)
Trauma Response Nurse Note-  Reason for Call / Reason for Trauma activation:   - L2 fall on thinners  Initial Focused Assessment (If applicable, or please see trauma documentation):  - GCS 15 - A/O x4 - 18G PIV to L AC - c/o HA to posterior region of head - pupils 3's equal and brisk.  Interventions:  - Trauma labs - Xrays of chest, pelvis and spine - CT scans of head and neck - set up purewick device  Plan of Care as of this note:  - Awaiting scan results  Event Summary:   Pt has been having multiple falls recently. Pt lost her balance this morning and fell hitting the back of her head.  No LOC.  No obvious hematoma or abrasions noted.  [place], [place]

## 2021-08-14 NOTE — ED Notes (Signed)
Pt to CT at this time with the TRN.

## 2021-08-14 NOTE — ED Provider Notes (Signed)
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 stenosis of the right carotid bifurcation or right internal carotid artery. 3. Patent vertebral arteries. Electronically Signed   By: KUlyses JarredM.D.   On: 08/14/2021 21:21   MR BRAIN WO CONTRAST  Result Date: 08/14/2021 CLINICAL DATA:  Dizziness EXAM: MRI HEAD WITHOUT CONTRAST TECHNIQUE: Multiplanar, multiecho pulse sequences of the brain and surrounding structures were obtained without intravenous contrast. COMPARISON:  11/14/2015 FINDINGS: Brain: No acute infarct, mass effect or extra-axial collection. No acute or chronic hemorrhage. There is multifocal hyperintense T2-weighted signal within the white matter. Generalized volume loss without a clear lobar predilection. 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 Vascular: Major flow voids are preserved. Skull and upper cervical spine: Normal calvarium and skull base. Visualized upper cervical spine and soft tissues are normal. Sinuses/Orbits:No paranasal sinus fluid levels or advanced mucosal thickening. No mastoid or middle  ear effusion. Normal orbits. IMPRESSION: 1. No acute intracranial abnormality. 2. Old bilateral cerebellar infarcts and findings of chronic microvascular disease. Cerebral Atrophy (ICD10-G31.9). Electronically Signed   By: KUlyses JarredM.D.   On: 08/14/2021 21:06   DG Pelvis Portable  Result Date: 08/14/2021 CLINICAL DATA:  Fall. EXAM: PORTABLE PELVIS 1-2 VIEWS COMPARISON:  CT abdomen pelvis dated Mar 09, 2021. FINDINGS: There is no evidence of pelvic fracture or diastasis. No pelvic bone lesions are seen. IMPRESSION: 1. No acute osseous abnormality. Electronically Signed   By: WTitus DubinM.D.   On: 08/14/2021 12:57   DG Chest Port 1 View  Result Date: 08/14/2021 CLINICAL DATA:  Fall. EXAM: PORTABLE CHEST 1 VIEW COMPARISON:  Chest x-ray dated April 12, 2021. FINDINGS: The heart size and mediastinal contours are within normal limits. Normal pulmonary vascularity. No focal consolidation, pleural effusion, or pneumothorax. No acute osseous abnormality. Incompletely visualized right proximal humerus ORIF. IMPRESSION: No active disease. Electronically Signed   By: WTitus DubinM.D.   On: 08/14/2021 12:56    Procedures Procedures   Medications Ordered in ED Medications  atorvastatin (LIPITOR) tablet 40 mg (40 mg Oral Given 08/14/21 1747)  metoprolol tartrate (LOPRESSOR) tablet 75 mg (75 mg Oral Given 08/14/21 2355)  FLUoxetine (PROZAC) capsule 40 mg (40 mg Oral Given 08/14/21 1746)  levothyroxine (SYNTHROID) tablet 50 mcg (50 mcg Oral Given 08/15/21 0603)  pantoprazole (PROTONIX) EC tablet 40 mg (40 mg Oral Given 08/14/21 2356)  pramipexole (MIRAPEX) tablet 0.25 mg (0.25 mg Oral Given 08/14/21 1758)  heparin injection 5,000 Units (5,000 Units Subcutaneous Given 08/15/21 0603)  acetaminophen (TYLENOL) tablet 650 mg (has no administration in time range)    Or  acetaminophen (TYLENOL) suppository 650 mg (has no administration in time range)  docusate sodium (COLACE) capsule 100 mg (100 mg Oral Given  08/14/21 2356)  senna (SENOKOT) tablet 8.6 mg (8.6 mg Oral Given 08/14/21 2356)  ondansetron (ZOFRAN) tablet 4 mg (has no administration in time range)    Or  ondansetron (ZOFRAN) injection 4 mg (has no administration in  time range)  cefTRIAXone (ROCEPHIN) 1 g in sodium chloride 0.9 % 100 mL IVPB (0 g Intravenous Stopped 08/14/21 1831)  insulin aspart (novoLOG) injection 0-15 Units (has no administration in time range)  pregabalin (LYRICA) capsule 100 mg (100 mg Oral Given 08/14/21 2355)  insulin glargine-yfgn (SEMGLEE) injection 20 Units (20 Units Subcutaneous Given 08/14/21 2358)  acetaminophen (TYLENOL) tablet 650 mg (650 mg Oral Given 08/14/21 1408)  lactated ringers bolus 250 mL (0 mLs Intravenous Stopped 08/14/21 1442)  gadobutrol (GADAVIST) 1 MMOL/ML injection 7.4 mL (7.4 mLs Intravenous Contrast Given 08/14/21 1938)    ED Course  I have reviewed the triage vital signs and the nursing notes.  Pertinent labs & imaging results that were available during my care of the patient were reviewed by me and considered in my medical decision making (see chart for details).    MDM Rules/Calculators/A&P                         CRITICAL CARE Performed by: Godfrey Pick   Total critical care time: 35 minutes  Critical care time was exclusive of separately billable procedures and treating other patients.  Critical care was necessary to treat or prevent imminent or life-threatening deterioration.  Critical care was time spent personally by me on the following activities: development of treatment plan with patient and/or surrogate as well as nursing, discussions with consultants, evaluation of patient's response to treatment, examination of patient, obtaining history from patient or surrogate, ordering and performing treatments and interventions, ordering and review of laboratory studies, ordering and review of radiographic studies, pulse oximetry and re-evaluation of patient's condition.   Patient is a  87 year old female who presents after a fall this morning.  Due to her current use of anticoagulation, patient does arrive as a level 2 trauma.  On arrival, she is alert and oriented.  She does endorse a posterior headache.  She denies any other areas of pain.  She has no focal neurologic deficits.  No areas of deformity or tenderness are noted on exam.  Lab work and imaging studies were ordered.  Tylenol was given for analgesia.  On reassessment, patient is accompanied by her daughter at bedside.  Patient's daughter provides further history that includes multiple falls over the past several days.  She does appear to have had an acute worsening of gait instability and tremor over the past few days.  CT scan of head and cervical spine showed no acute injuries.  X-ray imaging of thoracic and lumbar spine also without acute findings.  Patient's urine showed slight pyuria.  She has had painful urination lately.  She did discuss this with her primary care doctor who ordered urine studies, including culture.  Urine culture resulted 4 days ago and was negative for infection.  Patient did, however, still completed course of empiric Keflex for treatment of UTI.  Based on urinalysis today, no clear evidence of infection.  Urine cultures were ordered.  Following negative work-up, patient did undergo a trial of ambulation.  When resting in ED stretcher, she did appear well.  She was able to have normal conversation and denied any further symptoms.  When standing, patient has significant truncal ataxia.  Patient's daughter states that that is not baseline for her, but is consistent with how she has been over the past few days.  MRI studies were ordered to assess for central cause of this recent onset ataxia.  Given her gait instability and severe fatigue and weakness  following this trial of ambulation, additional lab studies were ordered as well.  Patient was admitted for further management.  Final Clinical Impression(s) / ED  Diagnoses Final diagnoses:  Fall    Rx / DC Orders ED Discharge Orders     None        Godfrey Pick, MD 08/15/21 [phone]

## 2021-08-14 NOTE — Progress Notes (Signed)
[facility] paged for Trauma Level II; when pt. arrived [facility] visited briefly after medical team finished initial evaluations; pt. says a family member is en route and [place] alerted ED entrance security that support persons may be arriving.  None present at this time; no further needs expressed.  Chaplains remain available as needed.

## 2021-08-14 NOTE — ED Triage Notes (Signed)
See trauma charting.   EMS reports pt has had multiple ground level falls over the weekend due to weakness in bilateral legs. This encounter is related to the pt falling this AM.

## 2021-08-14 NOTE — ED Notes (Signed)
CT delayed due to 1 scanner being down. CT aware of pt.

## 2021-08-14 NOTE — ED Notes (Signed)
Pt provided with meal tray.

## 2021-08-14 NOTE — ED Notes (Addendum)
Pt to MRI

## 2021-08-14 NOTE — ED Notes (Signed)
Pt remains off the unit in MRI.  

## 2021-08-15 ENCOUNTER — Encounter (HOSPITAL_COMMUNITY): Payer: Self-pay | Admitting: Family Medicine

## 2021-08-15 ENCOUNTER — Other Ambulatory Visit: Payer: Self-pay

## 2021-08-15 DIAGNOSIS — I5022 Chronic systolic (congestive) heart failure: Secondary | ICD-10-CM

## 2021-08-15 DIAGNOSIS — N390 Urinary tract infection, site not specified: Principal | ICD-10-CM

## 2021-08-15 LAB — COMPREHENSIVE METABOLIC PANEL
ALT: 13 U/L (ref 0–44)
AST: 16 U/L (ref 15–41)
Albumin: 3.5 g/dL (ref 3.5–5.0)
Alkaline Phosphatase: 76 U/L (ref 38–126)
Anion gap: 8 (ref 5–15)
BUN: 30 mg/dL — ABNORMAL HIGH (ref 8–23)
CO2: 32 mmol/L (ref 22–32)
Calcium: 9.2 mg/dL (ref 8.9–10.3)
Chloride: 94 mmol/L — ABNORMAL LOW (ref 98–111)
Creatinine, Ser: 1.91 mg/dL — ABNORMAL HIGH (ref 0.44–1.00)
GFR, Estimated: 24 mL/min — ABNORMAL LOW (ref >60)
Glucose, Bld: 233 mg/dL — ABNORMAL HIGH (ref 70–99)
Potassium: 5 mmol/L (ref 3.5–5.1)
Sodium: 134 mmol/L — ABNORMAL LOW (ref 135–145)
Total Bilirubin: 0.2 mg/dL — ABNORMAL LOW (ref 0.3–1.2)
Total Protein: 7 g/dL (ref 6.5–8.1)

## 2021-08-15 LAB — CBC
HCT: 44.2 % (ref 36.0–46.0)
Hemoglobin: 14.1 g/dL (ref 12.0–15.0)
MCH: 27.3 pg (ref 26.0–34.0)
MCHC: 31.9 g/dL (ref 30.0–36.0)
MCV: 85.7 fL (ref 80.0–100.0)
Platelets: 184 10*3/uL (ref 150–400)
RBC: 5.16 MIL/uL — ABNORMAL HIGH (ref 3.87–5.11)
RDW: 14.1 % (ref 11.5–15.5)
WBC: 9.2 10*3/uL (ref 4.0–10.5)
nRBC: 0 % (ref 0.0–0.2)

## 2021-08-15 LAB — GLUCOSE, CAPILLARY
Glucose-Capillary: 121 mg/dL — ABNORMAL HIGH (ref 70–99)
Glucose-Capillary: 137 mg/dL — ABNORMAL HIGH (ref 70–99)
Glucose-Capillary: 173 mg/dL — ABNORMAL HIGH (ref 70–99)
Glucose-Capillary: 214 mg/dL — ABNORMAL HIGH (ref 70–99)

## 2021-08-15 LAB — CBG MONITORING, ED
Glucose-Capillary: 188 mg/dL — ABNORMAL HIGH (ref 70–99)
Glucose-Capillary: 213 mg/dL — ABNORMAL HIGH (ref 70–99)
Glucose-Capillary: 229 mg/dL — ABNORMAL HIGH (ref 70–99)

## 2021-08-15 LAB — PHOSPHORUS
Phosphorus: 3.1 mg/dL (ref 2.5–4.6)
Phosphorus: 3.7 mg/dL (ref 2.5–4.6)

## 2021-08-15 LAB — VITAMIN B12: Vitamin B-12: 153 pg/mL — ABNORMAL LOW (ref 180–914)

## 2021-08-15 LAB — MAGNESIUM: Magnesium: 1.5 mg/dL — ABNORMAL LOW (ref 1.7–2.4)

## 2021-08-15 MED ORDER — MAGNESIUM SULFATE 2 GM/50ML IV SOLN
2.0000 g | Freq: Once | INTRAVENOUS | Status: AC
  Administered 2021-08-15: 2 g via INTRAVENOUS
  Filled 2021-08-15: qty 50

## 2021-08-15 MED ORDER — APIXABAN 2.5 MG PO TABS
2.5000 mg | ORAL_TABLET | Freq: Two times a day (BID) | ORAL | Status: DC
  Administered 2021-08-15 – 2021-08-18 (×6): 2.5 mg via ORAL
  Filled 2021-08-15 (×6): qty 1

## 2021-08-15 NOTE — Plan of Care (Signed)
  Problem: Education: Goal: Knowledge of General Education information will improve Description: Including pain rating scale, medication(s)/side effects and non-pharmacologic comfort measures Outcome: Progressing   Problem: Coping: Goal: Level of anxiety will decrease Outcome: Progressing   

## 2021-08-15 NOTE — Progress Notes (Addendum)
Progress Note    Melissa [name]  EHU:[id number] DOB: 16-Sep-1928  DOA: 08/14/2021 PCP: Darreld Mclean, MD      Brief Narrative:    Medical records reviewed and are as summarized below:  Melissa [name] is a 87 y.o. female with medical history significant for chronic systolic CHF (EF 20 to 78% in July 2022) of CKD stage IIIb, diabetes mellitus on insulin, diabetic neuropathy, hypertension, delusional disorder, GERD, paroxysmal atrial fibrillation on Eliquis, who presented to the hospital because of generalized weakness, unsteady gait and recurrent falls.  She was found to have acute UTI.    Assessment/Plan:   Principal Problem:   Generalized weakness Active Problems:   Type 2 diabetes mellitus with diabetic neuropathy, with long-term current use of insulin (HCC)   HLD (hyperlipidemia)   Hypothyroidism, acquired   Essential hypertension   CKD stage IIIb (HCC)   Paroxysmal atrial fibrillation (HCC)   Tremor, essential   History of CVA- old cerebellar infarcts noted on MRI   Chronic systolic heart failure (HCC)   RLS (restless legs syndrome)   Obstructive lung disease (HCC)   Delusional disorder (HCC)   Acute lower UTI    Body mass index is 27.29 kg/m.   Acute UTI: Continue IV Rocephin.  Follow-up urine and blood cultures.  Acute metabolic encephalopathy: Provide supportive care and treat UTI.  Hypomagnesemia: Replete magnesium and monitor levels  CKD stage IIIb/IV: Fluctuating creatinine.  Monitor BMP  Chronic systolic CHF (EF 20 to 58% in 04/2021 : Compensated.  Paroxysmal atrial fibrillation: CHA2DS2-VASc is 8.  Continue Eliquis  Generalized weakness, unsteady gait, recurrent falls: MRI brain did not show any evidence of acute stroke.  PT and OT evaluation.  Fall precautions  Other comorbidities include hypertension, restless leg syndrome, hyperlipidemia, insulin-dependent diabetes mellitus, history of stroke   Diet Order             DIET SOFT Room  service appropriate? Yes; Fluid consistency: Thin  Diet effective now                      Consultants: None  Procedures: None    Medications:    apixaban  2.5 mg Oral BID   atorvastatin  40 mg Oral Daily   docusate sodium  100 mg Oral BID   FLUoxetine  40 mg Oral Daily   insulin aspart  0-15 Units Subcutaneous TID WC   insulin glargine-yfgn  20 Units Subcutaneous QHS   levothyroxine  50 mcg Oral QAC breakfast   metoprolol tartrate  75 mg Oral BID   pantoprazole  40 mg Oral BID   pramipexole  0.25 mg Oral Q supper   pregabalin  100 mg Oral QHS   senna  1 tablet Oral BID   Continuous Infusions:  cefTRIAXone (ROCEPHIN)  IV Stopped (08/14/21 1831)     Anti-infectives (From admission, onward)    Start     Dose/Rate Route Frequency Ordered Stop   08/14/21 1645  cefTRIAXone (ROCEPHIN) 1 g in sodium chloride 0.9 % 100 mL IVPB        1 g 200 mL/hr over 30 Minutes Intravenous Every 24 hours 08/14/21 1644                Family Communication/Anticipated D/C date and plan/Code Status   DVT prophylaxis: apixaban (ELIQUIS) tablet 2.5 mg Start: 08/15/21 2200 apixaban (ELIQUIS) tablet 2.5 mg     Code Status: Full Code  Family Communication: Daughter at  the bedside Disposition Plan: Home in 2 to 3 days when medically ready   Status is: Inpatient  Remains inpatient appropriate because: On IV antibiotics for UTI           Subjective:   Interval events noted.  She complains of generalized weakness.  Her daughter and pastor were at the bedside.  Daughter said patient was confused and not been talking out of her head.  Objective:    Vitals:   08/15/21 0500 08/15/21 0600 08/15/21 0900 08/15/21 1100  BP: 124/61 133/73 (!) 123/54 (!) 147/63  Pulse: 75 67 77 72  Resp: 13 15 15 15   Temp:      TempSrc:      SpO2: 94% 93% 95% 96%  Weight:      Height:       No data found.   Intake/Output Summary (Last 24 hours) at 08/15/2021 1210 Last data  filed at 08/15/2021 0606 Gross per 24 hour  Intake 348.03 ml  Output 1000 ml  Net -651.97 ml   Filed Weights   08/14/21 1234  Weight: 72.1 kg    Exam:  GEN: NAD SKIN: No rash EYES: EOMI ENT: MMM CV: RRR PULM: CTA B ABD: soft, ND, NT, +BS CNS: AAO x 2 (person and place), non focal EXT: No edema or tenderness        Data Reviewed:   I have personally reviewed following labs and imaging studies:  Labs: Labs show the following:   Basic Metabolic Panel: Recent Labs  Lab 08/14/21 1234 08/14/21 1252 08/14/21 1646 08/15/21 0241  NA 135 137  --  134*  K 4.8 4.7  --  5.0  CL 96* 96*  --  94*  CO2 31  --   --  32  GLUCOSE 295* 291*  --  233*  BUN 33* 35*  --  30*  CREATININE 1.67* 1.70* 1.62* 1.91*  CALCIUM 9.1  --   --  9.2  MG  --   --   --  1.5*  PHOS  --   --   --  3.1   GFR Estimated Creatinine Clearance: 18.7 mL/min (A) (by C-G formula based on SCr of 1.91 mg/dL (H)). Liver Function Tests: Recent Labs  Lab 08/14/21 1234 08/15/21 0241  AST 19 16  ALT 13 13  ALKPHOS 71 76  BILITOT 0.4 0.2*  PROT 7.0 7.0  ALBUMIN 3.6 3.5   No results for input(s): LIPASE, AMYLASE in the last 168 hours. No results for input(s): AMMONIA in the last 168 hours. Coagulation profile Recent Labs  Lab 08/14/21 1234  INR 1.1    CBC: Recent Labs  Lab 08/14/21 1234 08/14/21 1252 08/14/21 1646 08/15/21 0241  WBC 9.7  --  8.3 9.2  HGB 13.6 14.6 13.8 14.1  HCT 43.6 43.0 42.7 44.2  MCV 86.3  --  84.7 85.7  PLT 203  --  186 184   Cardiac Enzymes: No results for input(s): CKTOTAL, CKMB, CKMBINDEX, TROPONINI in the last 168 hours. BNP (last 3 results) No results for input(s): PROBNP in the last 8760 hours. CBG: Recent Labs  Lab 08/14/21 1636 08/15/21 0034 08/15/21 0751 08/15/21 1129  GLUCAP 176* 229* 188* 213*   D-Dimer: No results for input(s): DDIMER in the last 72 hours. Hgb A1c: Recent Labs    08/14/21 1739  HGBA1C 8.7*   Lipid Profile: No  results for input(s): CHOL, HDL, LDLCALC, TRIG, CHOLHDL, LDLDIRECT in the last 72 hours. Thyroid function studies: Recent Labs  08/14/21 1646  TSH 5.033*   Anemia work up: No results for input(s): VITAMINB12, FOLATE, FERRITIN, TIBC, IRON, RETICCTPCT in the last 72 hours. Sepsis Labs: Recent Labs  Lab 08/14/21 1234 08/14/21 1646 08/15/21 0241  WBC 9.7 8.3 9.2  LATICACIDVEN 1.4  --   --     Microbiology Recent Results (from the past 240 hour(s))  Urine Culture     Status: None   Collection Time: 08/07/21  9:25 AM   Specimen: Urine  Result Value Ref Range Status   MICRO NUMBER: [id number]  Final   SPECIMEN QUALITY: Adequate  Final   Sample Source NOT GIVEN  Final   STATUS: FINAL  Final   ISOLATE 1:   Final    Less than 10,000 CFU/mL of single Gram negative organism isolated. No further testing will be performed. If clinically indicated, recollection using a method to minimize contamination, with prompt transfer to Urine Culture Transport Tube, is recommended.  Resp Panel by RT-PCR (Flu A&B, Covid) Urine, Clean Catch     Status: None   Collection Time: 08/14/21  3:13 PM   Specimen: Urine, Clean Catch; Nasopharyngeal(NP) swabs in vial transport medium  Result Value Ref Range Status   SARS Coronavirus 2 by RT PCR NEGATIVE NEGATIVE Final    Comment: (NOTE) SARS-CoV-2 target nucleic acids are NOT DETECTED.  The SARS-CoV-2 RNA is generally detectable in upper respiratory specimens during the acute phase of infection. The lowest concentration of SARS-CoV-2 viral copies this assay can detect is 138 copies/mL. A negative result does not preclude SARS-Cov-2 infection and should not be used as the sole basis for treatment or other patient management decisions. A negative result may occur with  improper specimen collection/handling, submission of specimen other than nasopharyngeal swab, presence of viral mutation(s) within the areas targeted by this assay, and inadequate number of  viral copies(<138 copies/mL). A negative result must be combined with clinical observations, patient history, and epidemiological information. The expected result is Negative.  Fact Sheet for Patients:  EntrepreneurPulse.com.au  Fact Sheet for Healthcare Providers:  IncredibleEmployment.be  This test is no t yet approved or cleared by the [place] FDA and  has been authorized for detection and/or diagnosis of SARS-CoV-2 by FDA under an Emergency Use Authorization (EUA). This EUA will remain  in effect (meaning this test can be used) for the duration of the COVID-19 declaration under Section 564(b)(1) of the Act, 21 U.S.C.section 360bbb-3(b)(1), unless the authorization is terminated  or revoked sooner.       Influenza A by PCR NEGATIVE NEGATIVE Final   Influenza B by PCR NEGATIVE NEGATIVE Final    Comment: (NOTE) The Xpert Xpress SARS-CoV-2/FLU/RSV plus assay is intended as an aid in the diagnosis of influenza from Nasopharyngeal swab specimens and should not be used as a sole basis for treatment. Nasal washings and aspirates are unacceptable for Xpert Xpress SARS-CoV-2/FLU/RSV testing.  Fact Sheet for Patients: EntrepreneurPulse.com.au  Fact Sheet for Healthcare Providers: IncredibleEmployment.be  This test is not yet approved or cleared by the [place] FDA and has been authorized for detection and/or diagnosis of SARS-CoV-2 by FDA under an Emergency Use Authorization (EUA). This EUA will remain in effect (meaning this test can be used) for the duration of the COVID-19 declaration under Section 564(b)(1) of the Act, 21 U.S.C. section 360bbb-3(b)(1), unless the authorization is terminated or revoked.  Performed at [facility], [place] [address], [place], [place] [address]     Procedures and diagnostic studies:  DG  Thoracic Spine 2 View  Result Date: 08/14/2021 CLINICAL DATA:  Fall,  back pain EXAM: THORACIC SPINE 2 VIEWS; LUMBAR SPINE - COMPLETE 4+ VIEW COMPARISON:  None. FINDINGS: No fracture or dislocation of the thoracic or lumbar spine. There is focally moderate disc space height loss and osteophytosis at L5-S1, with otherwise very well-preserved disc spaces for age. Mild, multilevel facet degenerative change. Included chest is unremarkable. Nonobstructive pattern of overlying bowel gas. Aortic atherosclerosis. IMPRESSION: 1. No fracture or dislocation of the thoracic or lumbar spine. 2. There is focally moderate disc space height loss and osteophytosis at L5-S1, with otherwise very well-preserved disc spaces for age. Mild, multilevel facet degenerative change. Electronically Signed   By: Delanna Ahmadi M.D.   On: 08/14/2021 14:03   DG Lumbar Spine Complete  Result Date: 08/14/2021 CLINICAL DATA:  Fall, back pain EXAM: THORACIC SPINE 2 VIEWS; LUMBAR SPINE - COMPLETE 4+ VIEW COMPARISON:  None. FINDINGS: No fracture or dislocation of the thoracic or lumbar spine. There is focally moderate disc space height loss and osteophytosis at L5-S1, with otherwise very well-preserved disc spaces for age. Mild, multilevel facet degenerative change. Included chest is unremarkable. Nonobstructive pattern of overlying bowel gas. Aortic atherosclerosis. IMPRESSION: 1. No fracture or dislocation of the thoracic or lumbar spine. 2. There is focally moderate disc space height loss and osteophytosis at L5-S1, with otherwise very well-preserved disc spaces for age. Mild, multilevel facet degenerative change. Electronically Signed   By: Delanna Ahmadi M.D.   On: 08/14/2021 14:03   CT HEAD WO CONTRAST  Result Date: 08/14/2021 CLINICAL DATA:  Head trauma, minor (Age >= 65y); Neck trauma (Age >= 65y). Fall today. On blood thinners. EXAM: CT HEAD WITHOUT CONTRAST CT CERVICAL SPINE WITHOUT CONTRAST TECHNIQUE: Multidetector CT imaging of the head and cervical spine was performed following the standard protocol  without intravenous contrast. Multiplanar CT image reconstructions of the cervical spine were also generated. COMPARISON:  CT head and cervical spine 03/09/2021. FINDINGS: CT HEAD FINDINGS Brain: There is no evidence of an acute infarct, intracranial hemorrhage, mass, midline shift, or extra-axial fluid collection. The ventricles and sulci are within normal limits for age. Small chronic infarcts are again seen in the left thalamus and both cerebellar hemispheres. Vascular: Calcified atherosclerosis at the skull base. No hyperdense vessel. Skull: No fracture or suspicious osseous lesion. Sinuses/Orbits: Clear paranasal sinuses. Small chronic bilateral mastoid effusions. Bilateral cataract extraction. Other: None. CT CERVICAL SPINE FINDINGS Alignment: Chronic straightening of the normal cervical lordosis with unchanged grade 1 anterolisthesis of C3 on C4 and C4 on C5. Skull base and vertebrae: No acute fracture or suspicious osseous lesion. Moderate median C1-2 arthropathy with noncompressive ligamentous thickening and calcification posterior to the dens. Soft tissues and spinal canal: No prevertebral fluid or swelling. No visible canal hematoma. Disc levels: Moderate to severe disc degeneration at C5-6 and C6-7. Advanced upper cervical facet arthrosis, most severe on the right at C3-4. No evidence of high-grade spinal canal or neural foraminal stenosis. Upper chest: No apical lung consolidation or mass. Other: Mild-to-moderate calcific atherosclerosis at the carotid bifurcations. IMPRESSION: 1. No evidence of acute intracranial abnormality. 2. Chronic small thalamic and cerebellar infarcts. 3. No acute cervical spine fracture. Electronically Signed   By: Logan Bores M.D.   On: 08/14/2021 13:21   CT CERVICAL SPINE WO CONTRAST  Result Date: 08/14/2021 CLINICAL DATA:  Head trauma, minor (Age >= 65y); Neck trauma (Age >= 65y). Fall today. On blood thinners. EXAM: CT HEAD WITHOUT CONTRAST CT CERVICAL SPINE  WITHOUT  CONTRAST TECHNIQUE: Multidetector CT imaging of the head and cervical spine was performed following the standard protocol without intravenous contrast. Multiplanar CT image reconstructions of the cervical spine were also generated. COMPARISON:  CT head and cervical spine 03/09/2021. FINDINGS: CT HEAD FINDINGS Brain: There is no evidence of an acute infarct, intracranial hemorrhage, mass, midline shift, or extra-axial fluid collection. The ventricles and sulci are within normal limits for age. Small chronic infarcts are again seen in the left thalamus and both cerebellar hemispheres. Vascular: Calcified atherosclerosis at the skull base. No hyperdense vessel. Skull: No fracture or suspicious osseous lesion. Sinuses/Orbits: Clear paranasal sinuses. Small chronic bilateral mastoid effusions. Bilateral cataract extraction. Other: None. CT CERVICAL SPINE FINDINGS Alignment: Chronic straightening of the normal cervical lordosis with unchanged grade 1 anterolisthesis of C3 on C4 and C4 on C5. Skull base and vertebrae: No acute fracture or suspicious osseous lesion. Moderate median C1-2 arthropathy with noncompressive ligamentous thickening and calcification posterior to the dens. Soft tissues and spinal canal: No prevertebral fluid or swelling. No visible canal hematoma. Disc levels: Moderate to severe disc degeneration at C5-6 and C6-7. Advanced upper cervical facet arthrosis, most severe on the right at C3-4. No evidence of high-grade spinal canal or neural foraminal stenosis. Upper chest: No apical lung consolidation or mass. Other: Mild-to-moderate calcific atherosclerosis at the carotid bifurcations. IMPRESSION: 1. No evidence of acute intracranial abnormality. 2. Chronic small thalamic and cerebellar infarcts. 3. No acute cervical spine fracture. Electronically Signed   By: Logan Bores M.D.   On: 08/14/2021 13:21   MR ANGIO HEAD WO CONTRAST  Result Date: 08/14/2021 CLINICAL DATA:  Dizziness EXAM: MRA HEAD  WITHOUT CONTRAST TECHNIQUE: Angiographic images of the Circle of Willis were acquired using MRA technique without intravenous contrast. COMPARISON:  No pertinent prior exam. FINDINGS: POSTERIOR CIRCULATION: --Vertebral arteries: Normal --Inferior cerebellar arteries: Normal. --Basilar artery: Normal. --Superior cerebellar arteries: Normal. --Posterior cerebral arteries: Normal. ANTERIOR CIRCULATION: --Intracranial internal carotid arteries: Normal. --Anterior cerebral arteries (ACA): Normal. --Middle cerebral arteries (MCA): Normal. ANATOMIC VARIANTS: Bilateral fetal origins of the posterior cerebral arteries. IMPRESSION: Normal intracranial MRA. Electronically Signed   By: Ulyses Jarred M.D.   On: 08/14/2021 21:13   MR Angiogram Neck W or Wo Contrast  Result Date: 08/14/2021 CLINICAL DATA:  Dizziness EXAM: MRA NECK WITHOUT AND WITH CONTRAST TECHNIQUE: Multiplanar and multiecho pulse sequences of the neck were obtained without and with intravenous contrast. Angiographic images of the neck were obtained using MRA technique without and with intravenous contrast. CONTRAST:  7.69mL GADAVIST GADOBUTROL 1 MMOL/ML IV SOLN COMPARISON:  None. FINDINGS: Normal aortic arch branching pattern. Limited visualization of the proximal common carotid arteries. The right carotid bifurcation and internal carotid artery are normal. There is atherosclerosis at the left carotid bifurcation causing 50% stenosis of the proximal left ICA. The distal left ICA is normal. Vertebral arteries are left-dominant and normal. IMPRESSION: 1. Approximately 50% stenosis of the proximal left ICA due to atherosclerosis. 2. No stenosis of the right carotid bifurcation or right internal carotid artery. 3. Patent vertebral arteries. Electronically Signed   By: Ulyses Jarred M.D.   On: 08/14/2021 21:21   MR BRAIN WO CONTRAST  Result Date: 08/14/2021 CLINICAL DATA:  Dizziness EXAM: MRI HEAD WITHOUT CONTRAST TECHNIQUE: Multiplanar, multiecho pulse  sequences of the brain and surrounding structures were obtained without intravenous contrast. COMPARISON:  11/14/2015 FINDINGS: Brain: No acute infarct, mass effect or extra-axial collection. No acute or chronic hemorrhage. There is multifocal hyperintense T2-weighted signal within the  white matter. Generalized volume loss without a clear lobar predilection. 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 Vascular: Major flow voids are preserved. Skull and upper cervical spine: Normal calvarium and skull base. Visualized upper cervical spine and soft tissues are normal. Sinuses/Orbits:No paranasal sinus fluid levels or advanced mucosal thickening. No mastoid or middle ear effusion. Normal orbits. IMPRESSION: 1. No acute intracranial abnormality. 2. Old bilateral cerebellar infarcts and findings of chronic microvascular disease. Cerebral Atrophy (ICD10-G31.9). Electronically Signed   By: Ulyses Jarred M.D.   On: 08/14/2021 21:06   DG Pelvis Portable  Result Date: 08/14/2021 CLINICAL DATA:  Fall. EXAM: PORTABLE PELVIS 1-2 VIEWS COMPARISON:  CT abdomen pelvis dated Mar 09, 2021. FINDINGS: There is no evidence of pelvic fracture or diastasis. No pelvic bone lesions are seen. IMPRESSION: 1. No acute osseous abnormality. Electronically Signed   By: Titus Dubin M.D.   On: 08/14/2021 12:57   DG Chest Port 1 View  Result Date: 08/14/2021 CLINICAL DATA:  Fall. EXAM: PORTABLE CHEST 1 VIEW COMPARISON:  Chest x-ray dated April 12, 2021. FINDINGS: The heart size and mediastinal contours are within normal limits. Normal pulmonary vascularity. No focal consolidation, pleural effusion, or pneumothorax. No acute osseous abnormality. Incompletely visualized right proximal humerus ORIF. IMPRESSION: No active disease. Electronically Signed   By: Titus Dubin M.D.   On: 08/14/2021 12:56               LOS: 1 day   Ashvik Grundman  [facility]   Pager on [email or web address]. If 7PM-7AM, please contact  night-coverage at [email or web address]     08/15/2021, 12:10 PM

## 2021-08-15 NOTE — ED Notes (Signed)
Pt continent of stool in bed pan. No distress noted, resting in stretcher, call light in reach.

## 2021-08-15 NOTE — Plan of Care (Signed)

## 2021-08-16 ENCOUNTER — Ambulatory Visit: Payer: Medicare Other | Admitting: Family Medicine

## 2021-08-16 DIAGNOSIS — E538 Deficiency of other specified B group vitamins: Secondary | ICD-10-CM

## 2021-08-16 LAB — BASIC METABOLIC PANEL
Anion gap: 7 (ref 5–15)
BUN: 22 mg/dL (ref 8–23)
CO2: 31 mmol/L (ref 22–32)
Calcium: 9.4 mg/dL (ref 8.9–10.3)
Chloride: 96 mmol/L — ABNORMAL LOW (ref 98–111)
Creatinine, Ser: 1.48 mg/dL — ABNORMAL HIGH (ref 0.44–1.00)
GFR, Estimated: 33 mL/min — ABNORMAL LOW (ref >60)
Glucose, Bld: 166 mg/dL — ABNORMAL HIGH (ref 70–99)
Potassium: 4.8 mmol/L (ref 3.5–5.1)
Sodium: 134 mmol/L — ABNORMAL LOW (ref 135–145)

## 2021-08-16 LAB — GLUCOSE, CAPILLARY
Glucose-Capillary: 182 mg/dL — ABNORMAL HIGH (ref 70–99)
Glucose-Capillary: 216 mg/dL — ABNORMAL HIGH (ref 70–99)
Glucose-Capillary: 157 mg/dL — ABNORMAL HIGH (ref 70–99)
Glucose-Capillary: 122 mg/dL — ABNORMAL HIGH (ref 70–99)
Glucose-Capillary: 230 mg/dL — ABNORMAL HIGH (ref 70–99)

## 2021-08-16 LAB — MAGNESIUM: Magnesium: 2 mg/dL (ref 1.7–2.4)

## 2021-08-16 MED ORDER — QUETIAPINE FUMARATE 100 MG PO TABS
100.0000 mg | ORAL_TABLET | Freq: Every day | ORAL | Status: DC
  Administered 2021-08-16 – 2021-08-17 (×2): 100 mg via ORAL
  Filled 2021-08-16 (×2): qty 1

## 2021-08-16 MED ORDER — CYANOCOBALAMIN 1000 MCG/ML IJ SOLN
1000.0000 ug | Freq: Every day | INTRAMUSCULAR | Status: DC
  Administered 2021-08-16 – 2021-08-18 (×3): 1000 ug via SUBCUTANEOUS
  Filled 2021-08-16 (×3): qty 1

## 2021-08-16 NOTE — Plan of Care (Signed)

## 2021-08-16 NOTE — TOC Progression Note (Signed)
Transition of Care [facility]) - Progression Note    Patient Details  Name: Melissa [name] MRN: [id number] Date of Birth: 18-Sep-1928  Transition of Care [facility]) CM/SW [place], RN Phone [phone]  08/16/2021, 9:28 AM  Clinical Narrative:    TOC consulted to assist patient with medications. Patient has medicare and BCBS so she will not be eligible for medication assist. TOC will continue to follow for any other needs.         Expected Discharge Plan and Services                                                 Social Determinants of Health (SDOH) Interventions    Readmission Risk Interventions Readmission Risk Prevention Plan 04/16/2021  Transportation Screening Complete  PCP or Specialist Appt within 3-5 Days Complete  HRI or [place] Complete  Social Work Consult for [place] Planning/Counseling Complete  Palliative Care Screening Not Applicable  Medication Review [employer] photographer) Complete  Some recent data might be hidden

## 2021-08-16 NOTE — TOC Initial Note (Signed)
Transition of Care [facility]) - Initial/Assessment Note    Patient Details  Name: Melissa [name] MRN: [id number] Date of Birth: Aug 26, 1928  Transition of Care [facility]) CM/SW Contact:    Melissa Ingles, RN Phone Number:[phone]   08/16/2021, 11:12 AM  Clinical Narrative:                 [facility] consulted for patient with high risk for readmission. Patient is intermittently confused alert to self at this time. Daughter Melissa [name] is at the bedside and able to answer questions about patient . Per daughter patient is from home with daughter. CM inquired with daughter on reason for consult with medications. Daughter states that she does not know why the patient would have medication assist consult because the patient receives meds from [facility] and has no difficulty receiving or paying for medications. Daughter reports that patient has PCP Melissa [name] and follows up on a regular basis. Daughter states that patient was able to ambulate and care for herself prior to admission but has been weak with difficulty walking every since she developed the UTI. Daughter plans to take patient home with her at discharged. Patient has DME- rollator and canes. No other needs noted at this time. TOC will continue to follow.   Expected Discharge Plan: [place] Barriers to Discharge: Continued Medical Work up   Patient Goals and CMS Choice Patient states their goals for this hospitalization and ongoing recovery are:: Per daughter- To be able to walk on her own and to get rid of the UTI.   Choice offered to / list presented to : NA  Expected Discharge Plan and Services Expected Discharge Plan: [place] In-house Referral: NA Discharge Planning Services: CM Consult   Living arrangements for the past 2 months: Single Family Home                 DME Arranged: N/A         HH Arranged: NA [place] Agency: NA        Prior Living Arrangements/Services Living arrangements for  the past 2 months: Single Family Home Lives with:: Adult Children Patient language and need for interpreter reviewed:: Yes Do you feel safe going back to the place where you live?: Yes      Need for Family Participation in Patient Care: Yes (Comment) Care giver support system in place?: Yes (comment)   Criminal Activity/Legal Involvement Pertinent to Current Situation/Hospitalization: No - Comment as needed  Activities of Daily Living Home Assistive Devices/Equipment: None ADL Screening (condition at time of admission) Patient's cognitive ability adequate to safely complete daily activities?: Yes Is the patient deaf or have difficulty hearing?: Yes Does the patient have difficulty seeing, even when wearing glasses/contacts?: Yes Does the patient have difficulty concentrating, remembering, or making decisions?: Yes Patient able to express need for assistance with ADLs?: Yes Does the patient have difficulty dressing or bathing?: Yes Independently performs ADLs?: No Does the patient have difficulty walking or climbing stairs?: Yes Weakness of Legs: Both Weakness of Arms/Hands: Both  Permission Sought/Granted Permission sought to share information with : Family Supports Permission granted to share information with : Yes, Verbal Permission Granted  Share Information with NAME: Melissa [name]     Permission granted to share info w Relationship: daughter     Emotional Assessment Appearance:: Appears stated age Attitude/Demeanor/Rapport: Unable to Assess (patient is currently confused) Affect (typically observed): Unable to Assess Orientation: : Oriented to Self, Fluctuating Orientation (  Suspected and/or reported Sundowners) Alcohol / Substance Use: Not Applicable Psych Involvement: No (comment)  Admission diagnosis:  Fall [W19.XXXA] Generalized weakness [R53.1] Patient Active Problem List   Diagnosis Date Noted   Vitamin B12 deficiency 08/16/2021   Acute lower UTI 08/15/2021    Generalized weakness 08/14/2021   Delusional disorder ([place]) 07/13/2021   Persistent atrial fibrillation (HCC)    Acute on chronic systolic CHF (congestive heart failure) ([place]) 04/12/2021   Atrial fibrillation with rapid ventricular response ([place]) PMH PAF 04/06/2021   Secondary hypercoagulable state ([place]) 04/06/2021   Osteopenia 08/29/2020   Healthcare maintenance 07/06/2020   Hypokalemia 02/25/2020   Urgency of urination 02/25/2020   Chest pain 02/08/2020   Bronchiectasis without complication ([place]) 51/70/0174   Obstructive lung disease ([place]) 01/28/2020   Atrial fibrillation with RVR ([place])    Pneumonia 07/11/2019   Abnormal chest CT 04/07/2018   RLS (restless legs syndrome) 08/05/2017   Closed fracture of right proximal humerus 06/05/2017   High cholesterol 06/05/2017   Hypertension 06/05/2017   NICM (nonischemic cardiomyopathy) ([place]) 06/05/2017   Anemia 06/05/2017   Dyspnea 94/49/6759   Chronic systolic heart failure ([place]) 10/19/2016   Symptomatic cholelithiasis 01/24/2016   History of CVA- old cerebellar infarcts noted on MRI 11/16/2015   Chest pain with moderate risk of acute coronary syndrome 11/14/2015   Left upper extremity numbness    Paresthesia 08/22/2015   Tremor, essential 08/22/2015   Paroxysmal atrial fibrillation (HCC) 03/06/2015   Chronic anticoagulation 03/06/2015   Orthostatic hypotension 03/06/2015   Essential hypertension 04/19/2014   CKD stage IIIb ([place]) 04/19/2014   Hypothyroidism, acquired 06/15/2013   Normocytic anemia 06/10/2013   Type 2 diabetes mellitus with diabetic neuropathy, with long-term current use of insulin ([place]) 09/22/2011   HLD (hyperlipidemia) 09/22/2011   PCP:  Melissa Mclean, MD Pharmacy:   [facility], [address] [place] [address] Phone: [phone] Fax: [phone]  [facility] [place], [place] - [address] [place] [address] Phone: [phone] Fax: [phone]     Social Determinants of Health (SDOH) Interventions    Readmission Risk Interventions Readmission Risk Prevention Plan 08/16/2021 04/16/2021  Transportation Screening Complete Complete  PCP or Specialist Appt within 3-5 Days - Complete  HRI or [place] - Complete  Social Work Consult for [place] Planning/Counseling - Complete  Palliative Care Screening - Not Applicable  Medication Review Press photographer) Complete Complete  PCP or Specialist appointment within 3-5 days of discharge Complete -  [place] or Home Care Consult Complete -  SW Recovery Care/Counseling Consult Complete -  Some recent data might be hidden

## 2021-08-16 NOTE — Progress Notes (Addendum)
Progress Note    Melissa [name]  ZOX:[id number] DOB: [birth year]  DOA: 08/14/2021 PCP: Darreld Mclean, MD      Brief Narrative:    Medical records reviewed and are as summarized below:  Melissa [name] is a 87 y.o. female with medical history significant for chronic systolic CHF (EF 20 to 81% in July 2022) of CKD stage IIIb, diabetes mellitus on insulin, diabetic neuropathy, hypertension, delusional disorder, GERD, paroxysmal atrial fibrillation on Eliquis, who presented to the hospital because of generalized weakness, unsteady gait and recurrent falls.  She was found to have acute UTI.    Assessment/Plan:   Principal Problem:   Generalized weakness Active Problems:   Type 2 diabetes mellitus with diabetic neuropathy, with long-term current use of insulin (HCC)   HLD (hyperlipidemia)   Hypothyroidism, acquired   Essential hypertension   CKD stage IIIb (HCC)   Paroxysmal atrial fibrillation (HCC)   Tremor, essential   History of CVA- old cerebellar infarcts noted on MRI   Chronic systolic heart failure (HCC)   RLS (restless legs syndrome)   Obstructive lung disease (HCC)   Delusional disorder (HCC)   Acute lower UTI   Vitamin B12 deficiency    Body mass index is 27.29 kg/m.   Acute UTI: Continue IV Rocephin.  Urine and blood cultures are pending.  Acute metabolic encephalopathy, history of hallucinations: Improving.  Mental status is better today.  She takes Seroquel at home for "hallucinations".  Her daughter said that patient's PCP had told her patient may have early dementia.   Hypomagnesemia: Replete magnesium and monitor levels  Vitamin B12 deficiency: B12 level was 153.  Start vitamin B12 injections.  Hypomagnesemia: Improved  Chronic systolic CHF (EF 20 to 19% in 04/2021 : Compensated.  Paroxysmal atrial fibrillation: CHA2DS2-VASc is 8.  Continue Eliquis  Generalized weakness, unsteady gait, recurrent falls: MRI brain did not show any evidence of  acute stroke.  PT evaluation.  Fall precautions  Other comorbidities include hypertension, restless leg syndrome, hyperlipidemia, insulin-dependent diabetes mellitus, history of stroke   Diet Order             DIET SOFT Room service appropriate? Yes; Fluid consistency: Thin  Diet effective now                      Consultants: None  Procedures: None    Medications:    apixaban  2.5 mg Oral BID   atorvastatin  40 mg Oral Daily   cyanocobalamin  1,000 mcg Subcutaneous Daily   docusate sodium  100 mg Oral BID   FLUoxetine  40 mg Oral Daily   insulin aspart  0-15 Units Subcutaneous TID WC   insulin glargine-yfgn  20 Units Subcutaneous QHS   levothyroxine  50 mcg Oral QAC breakfast   metoprolol tartrate  75 mg Oral BID   pantoprazole  40 mg Oral BID   pramipexole  0.25 mg Oral Q supper   pregabalin  100 mg Oral QHS   QUEtiapine  100 mg Oral QHS   senna  1 tablet Oral BID   Continuous Infusions:  cefTRIAXone (ROCEPHIN)  IV 1 g (08/15/21 1756)     Anti-infectives (From admission, onward)    Start     Dose/Rate Route Frequency Ordered Stop   08/14/21 1645  cefTRIAXone (ROCEPHIN) 1 g in sodium chloride 0.9 % 100 mL IVPB        1 g 200 mL/hr over 30 Minutes Intravenous Every  24 hours 08/14/21 1644                Family Communication/Anticipated D/C date and plan/Code Status   DVT prophylaxis: apixaban (ELIQUIS) tablet 2.5 mg Start: 08/15/21 2200 apixaban (ELIQUIS) tablet 2.5 mg     Code Status: Full Code  Family Communication: Daughter at the bedside Disposition Plan: Discharge to home in 1 to 2 days   Status is: Inpatient  Remains inpatient appropriate because: On IV antibiotics for UTI           Subjective:   She complains of generalized weakness but she feels better today.  Her daughter was at the bedside and she reported that patient has episodes of confusion/hallucination.  Patient was able to ambulate with her walker in the  room.  Objective:    Vitals:   08/15/21 2100 08/16/21 0441 08/16/21 0809 08/16/21 0920  BP: (!) 152/55 (!) 152/64 (!) 136/107 135/66  Pulse: 79 72 82   Resp: 13  (!) 22   Temp: 98 F (36.7 C) 97.9 F (36.6 C) 97.8 F (36.6 C)   TempSrc: Oral Oral Oral   SpO2: 95%  91%   Weight:      Height:       No data found.   Intake/Output Summary (Last 24 hours) at 08/16/2021 1248 Last data filed at 08/16/2021 1610 Gross per 24 hour  Intake 61.78 ml  Output 3 ml  Net 58.78 ml   Filed Weights   08/14/21 1234  Weight: 72.1 kg    Exam:  GEN: NAD SKIN: Warm and dry EYES: No pallor or icterus ENT: MMM CV: RRR PULM: CTA B ABD: soft, ND, NT, +BS CNS: AAO x 2 (person and place), non focal EXT: No edema or tenderness          Data Reviewed:   I have personally reviewed following labs and imaging studies:  Labs: Labs show the following:   Basic Metabolic Panel: Recent Labs  Lab 08/14/21 1234 08/14/21 1252 08/14/21 1646 08/15/21 0241 08/15/21 1459 08/16/21 0320  NA 135 137  --  134*  --  134*  K 4.8 4.7  --  5.0  --  4.8  CL 96* 96*  --  94*  --  96*  CO2 31  --   --  32  --  31  GLUCOSE 295* 291*  --  233*  --  166*  BUN 33* 35*  --  30*  --  22  CREATININE 1.67* 1.70* 1.62* 1.91*  --  1.48*  CALCIUM 9.1  --   --  9.2  --  9.4  MG  --   --   --  1.5*  --  2.0  PHOS  --   --   --  3.1 3.7  --    GFR Estimated Creatinine Clearance: 24.1 mL/min (A) (by C-G formula based on SCr of 1.48 mg/dL (H)). Liver Function Tests: Recent Labs  Lab 08/14/21 1234 08/15/21 0241  AST 19 16  ALT 13 13  ALKPHOS 71 76  BILITOT 0.4 0.2*  PROT 7.0 7.0  ALBUMIN 3.6 3.5   No results for input(s): LIPASE, AMYLASE in the last 168 hours. No results for input(s): AMMONIA in the last 168 hours. Coagulation profile Recent Labs  Lab 08/14/21 1234  INR 1.1    CBC: Recent Labs  Lab 08/14/21 1234 08/14/21 1252 08/14/21 1646 08/15/21 0241  WBC 9.7  --  8.3 9.2  HGB  13.6 14.6 13.8  14.1  HCT 43.6 43.0 42.7 44.2  MCV 86.3  --  84.7 85.7  PLT 203  --  186 184   Cardiac Enzymes: No results for input(s): CKTOTAL, CKMB, CKMBINDEX, TROPONINI in the last 168 hours. BNP (last 3 results) No results for input(s): PROBNP in the last 8760 hours. CBG: Recent Labs  Lab 08/15/21 2103 08/15/21 2339 08/16/21 0439 08/16/21 0806 08/16/21 1154  GLUCAP 214* 173* 182* 216* 157*   D-Dimer: No results for input(s): DDIMER in the last 72 hours. Hgb A1c: Recent Labs    08/14/21 1739  HGBA1C 8.7*   Lipid Profile: No results for input(s): CHOL, HDL, LDLCALC, TRIG, CHOLHDL, LDLDIRECT in the last 72 hours. Thyroid function studies: Recent Labs    08/14/21 1646  TSH 5.033*   Anemia work up: Recent Labs    08/15/21 1459  VITAMINB12 153*   Sepsis Labs: Recent Labs  Lab 08/14/21 1234 08/14/21 1646 08/15/21 0241  WBC 9.7 8.3 9.2  LATICACIDVEN 1.4  --   --     Microbiology Recent Results (from the past 240 hour(s))  Urine Culture     Status: None   Collection Time: 08/07/21  9:25 AM   Specimen: Urine  Result Value Ref Range Status   MICRO NUMBER: [id number]  Final   SPECIMEN QUALITY: Adequate  Final   Sample Source NOT GIVEN  Final   STATUS: FINAL  Final   ISOLATE 1:   Final    Less than 10,000 CFU/mL of single Gram negative organism isolated. No further testing will be performed. If clinically indicated, recollection using a method to minimize contamination, with prompt transfer to Urine Culture Transport Tube, is recommended.  Resp Panel by RT-PCR (Flu A&B, Covid) Urine, Clean Catch     Status: None   Collection Time: 08/14/21  3:13 PM   Specimen: Urine, Clean Catch; Nasopharyngeal(NP) swabs in vial transport medium  Result Value Ref Range Status   SARS Coronavirus 2 by RT PCR NEGATIVE NEGATIVE Final    Comment: (NOTE) SARS-CoV-2 target nucleic acids are NOT DETECTED.  The SARS-CoV-2 RNA is generally detectable in upper respiratory specimens  during the acute phase of infection. The lowest concentration of SARS-CoV-2 viral copies this assay can detect is 138 copies/mL. A negative result does not preclude SARS-Cov-2 infection and should not be used as the sole basis for treatment or other patient management decisions. A negative result may occur with  improper specimen collection/handling, submission of specimen other than nasopharyngeal swab, presence of viral mutation(s) within the areas targeted by this assay, and inadequate number of viral copies(<138 copies/mL). A negative result must be combined with clinical observations, patient history, and epidemiological information. The expected result is Negative.  Fact Sheet for Patients:  EntrepreneurPulse.com.au  Fact Sheet for Healthcare Providers:  IncredibleEmployment.be  This test is no t yet approved or cleared by the [place] FDA and  has been authorized for detection and/or diagnosis of SARS-CoV-2 by FDA under an Emergency Use Authorization (EUA). This EUA will remain  in effect (meaning this test can be used) for the duration of the COVID-19 declaration under Section 564(b)(1) of the Act, 21 U.S.C.section 360bbb-3(b)(1), unless the authorization is terminated  or revoked sooner.       Influenza A by PCR NEGATIVE NEGATIVE Final   Influenza B by PCR NEGATIVE NEGATIVE Final    Comment: (NOTE) The Xpert Xpress SARS-CoV-2/FLU/RSV plus assay is intended as an aid in the diagnosis of influenza from Nasopharyngeal swab specimens and should  not be used as a sole basis for treatment. Nasal washings and aspirates are unacceptable for Xpert Xpress SARS-CoV-2/FLU/RSV testing.  Fact Sheet for Patients: EntrepreneurPulse.com.au  Fact Sheet for Healthcare Providers: IncredibleEmployment.be  This test is not yet approved or cleared by the [place] FDA and has been authorized for detection  and/or diagnosis of SARS-CoV-2 by FDA under an Emergency Use Authorization (EUA). This EUA will remain in effect (meaning this test can be used) for the duration of the COVID-19 declaration under Section 564(b)(1) of the Act, 21 U.S.C. section 360bbb-3(b)(1), unless the authorization is terminated or revoked.  Performed at [facility], [place] [address], [place], [place] [address]     Procedures and diagnostic studies:  DG Thoracic Spine 2 View  Result Date: 08/14/2021 CLINICAL DATA:  Fall, back pain EXAM: THORACIC SPINE 2 VIEWS; LUMBAR SPINE - COMPLETE 4+ VIEW COMPARISON:  None. FINDINGS: No fracture or dislocation of the thoracic or lumbar spine. There is focally moderate disc space height loss and osteophytosis at L5-S1, with otherwise very well-preserved disc spaces for age. Mild, multilevel facet degenerative change. Included chest is unremarkable. Nonobstructive pattern of overlying bowel gas. Aortic atherosclerosis. IMPRESSION: 1. No fracture or dislocation of the thoracic or lumbar spine. 2. There is focally moderate disc space height loss and osteophytosis at L5-S1, with otherwise very well-preserved disc spaces for age. Mild, multilevel facet degenerative change. Electronically Signed   By: Delanna Ahmadi M.D.   On: 08/14/2021 14:03   DG Lumbar Spine Complete  Result Date: 08/14/2021 CLINICAL DATA:  Fall, back pain EXAM: THORACIC SPINE 2 VIEWS; LUMBAR SPINE - COMPLETE 4+ VIEW COMPARISON:  None. FINDINGS: No fracture or dislocation of the thoracic or lumbar spine. There is focally moderate disc space height loss and osteophytosis at L5-S1, with otherwise very well-preserved disc spaces for age. Mild, multilevel facet degenerative change. Included chest is unremarkable. Nonobstructive pattern of overlying bowel gas. Aortic atherosclerosis. IMPRESSION: 1. No fracture or dislocation of the thoracic or lumbar spine. 2. There is focally moderate disc space height loss and osteophytosis at  L5-S1, with otherwise very well-preserved disc spaces for age. Mild, multilevel facet degenerative change. Electronically Signed   By: Delanna Ahmadi M.D.   On: 08/14/2021 14:03   CT HEAD WO CONTRAST  Result Date: 08/14/2021 CLINICAL DATA:  Head trauma, minor (Age >= 65y); Neck trauma (Age >= 65y). Fall today. On blood thinners. EXAM: CT HEAD WITHOUT CONTRAST CT CERVICAL SPINE WITHOUT CONTRAST TECHNIQUE: Multidetector CT imaging of the head and cervical spine was performed following the standard protocol without intravenous contrast. Multiplanar CT image reconstructions of the cervical spine were also generated. COMPARISON:  CT head and cervical spine 03/09/2021. FINDINGS: CT HEAD FINDINGS Brain: There is no evidence of an acute infarct, intracranial hemorrhage, mass, midline shift, or extra-axial fluid collection. The ventricles and sulci are within normal limits for age. Small chronic infarcts are again seen in the left thalamus and both cerebellar hemispheres. Vascular: Calcified atherosclerosis at the skull base. No hyperdense vessel. Skull: No fracture or suspicious osseous lesion. Sinuses/Orbits: Clear paranasal sinuses. Small chronic bilateral mastoid effusions. Bilateral cataract extraction. Other: None. CT CERVICAL SPINE FINDINGS Alignment: Chronic straightening of the normal cervical lordosis with unchanged grade 1 anterolisthesis of C3 on C4 and C4 on C5. Skull base and vertebrae: No acute fracture or suspicious osseous lesion. Moderate median C1-2 arthropathy with noncompressive ligamentous thickening and calcification posterior to the dens. Soft tissues and spinal canal: No prevertebral fluid or swelling. No  visible canal hematoma. Disc levels: Moderate to severe disc degeneration at C5-6 and C6-7. Advanced upper cervical facet arthrosis, most severe on the right at C3-4. No evidence of high-grade spinal canal or neural foraminal stenosis. Upper chest: No apical lung consolidation or mass. Other:  Mild-to-moderate calcific atherosclerosis at the carotid bifurcations. IMPRESSION: 1. No evidence of acute intracranial abnormality. 2. Chronic small thalamic and cerebellar infarcts. 3. No acute cervical spine fracture. Electronically Signed   By: Logan Bores M.D.   On: 08/14/2021 13:21   CT CERVICAL SPINE WO CONTRAST  Result Date: 08/14/2021 CLINICAL DATA:  Head trauma, minor (Age >= 65y); Neck trauma (Age >= 65y). Fall today. On blood thinners. EXAM: CT HEAD WITHOUT CONTRAST CT CERVICAL SPINE WITHOUT CONTRAST TECHNIQUE: Multidetector CT imaging of the head and cervical spine was performed following the standard protocol without intravenous contrast. Multiplanar CT image reconstructions of the cervical spine were also generated. COMPARISON:  CT head and cervical spine 03/09/2021. FINDINGS: CT HEAD FINDINGS Brain: There is no evidence of an acute infarct, intracranial hemorrhage, mass, midline shift, or extra-axial fluid collection. The ventricles and sulci are within normal limits for age. Small chronic infarcts are again seen in the left thalamus and both cerebellar hemispheres. Vascular: Calcified atherosclerosis at the skull base. No hyperdense vessel. Skull: No fracture or suspicious osseous lesion. Sinuses/Orbits: Clear paranasal sinuses. Small chronic bilateral mastoid effusions. Bilateral cataract extraction. Other: None. CT CERVICAL SPINE FINDINGS Alignment: Chronic straightening of the normal cervical lordosis with unchanged grade 1 anterolisthesis of C3 on C4 and C4 on C5. Skull base and vertebrae: No acute fracture or suspicious osseous lesion. Moderate median C1-2 arthropathy with noncompressive ligamentous thickening and calcification posterior to the dens. Soft tissues and spinal canal: No prevertebral fluid or swelling. No visible canal hematoma. Disc levels: Moderate to severe disc degeneration at C5-6 and C6-7. Advanced upper cervical facet arthrosis, most severe on the right at C3-4. No  evidence of high-grade spinal canal or neural foraminal stenosis. Upper chest: No apical lung consolidation or mass. Other: Mild-to-moderate calcific atherosclerosis at the carotid bifurcations. IMPRESSION: 1. No evidence of acute intracranial abnormality. 2. Chronic small thalamic and cerebellar infarcts. 3. No acute cervical spine fracture. Electronically Signed   By: Logan Bores M.D.   On: 08/14/2021 13:21   MR ANGIO HEAD WO CONTRAST  Result Date: 08/14/2021 CLINICAL DATA:  Dizziness EXAM: MRA HEAD WITHOUT CONTRAST TECHNIQUE: Angiographic images of the Circle of Willis were acquired using MRA technique without intravenous contrast. COMPARISON:  No pertinent prior exam. FINDINGS: POSTERIOR CIRCULATION: --Vertebral arteries: Normal --Inferior cerebellar arteries: Normal. --Basilar artery: Normal. --Superior cerebellar arteries: Normal. --Posterior cerebral arteries: Normal. ANTERIOR CIRCULATION: --Intracranial internal carotid arteries: Normal. --Anterior cerebral arteries (ACA): Normal. --Middle cerebral arteries (MCA): Normal. ANATOMIC VARIANTS: Bilateral fetal origins of the posterior cerebral arteries. IMPRESSION: Normal intracranial MRA. Electronically Signed   By: Ulyses Jarred M.D.   On: 08/14/2021 21:13   MR Angiogram Neck W or Wo Contrast  Result Date: 08/14/2021 CLINICAL DATA:  Dizziness EXAM: MRA NECK WITHOUT AND WITH CONTRAST TECHNIQUE: Multiplanar and multiecho pulse sequences of the neck were obtained without and with intravenous contrast. Angiographic images of the neck were obtained using MRA technique without and with intravenous contrast. CONTRAST:  7.68mL GADAVIST GADOBUTROL 1 MMOL/ML IV SOLN COMPARISON:  None. FINDINGS: Normal aortic arch branching pattern. Limited visualization of the proximal common carotid arteries. The right carotid bifurcation and internal carotid artery are normal. There is atherosclerosis at the left carotid bifurcation causing  50% stenosis of the proximal left  ICA. The distal left ICA is normal. Vertebral arteries are left-dominant and normal. IMPRESSION: 1. Approximately 50% stenosis of the proximal left ICA due to atherosclerosis. 2. No stenosis of the right carotid bifurcation or right internal carotid artery. 3. Patent vertebral arteries. Electronically Signed   By: Ulyses Jarred M.D.   On: 08/14/2021 21:21   MR BRAIN WO CONTRAST  Result Date: 08/14/2021 CLINICAL DATA:  Dizziness EXAM: MRI HEAD WITHOUT CONTRAST TECHNIQUE: Multiplanar, multiecho pulse sequences of the brain and surrounding structures were obtained without intravenous contrast. COMPARISON:  11/14/2015 FINDINGS: Brain: No acute infarct, mass effect or extra-axial collection. No acute or chronic hemorrhage. There is multifocal hyperintense T2-weighted signal within the white matter. Generalized volume loss without a clear lobar predilection. 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 Vascular: Major flow voids are preserved. Skull and upper cervical spine: Normal calvarium and skull base. Visualized upper cervical spine and soft tissues are normal. Sinuses/Orbits:No paranasal sinus fluid levels or advanced mucosal thickening. No mastoid or middle ear effusion. Normal orbits. IMPRESSION: 1. No acute intracranial abnormality. 2. Old bilateral cerebellar infarcts and findings of chronic microvascular disease. Cerebral Atrophy (ICD10-G31.9). Electronically Signed   By: Ulyses Jarred M.D.   On: 08/14/2021 21:06   DG Pelvis Portable  Result Date: 08/14/2021 CLINICAL DATA:  Fall. EXAM: PORTABLE PELVIS 1-2 VIEWS COMPARISON:  CT abdomen pelvis dated Mar 09, 2021. FINDINGS: There is no evidence of pelvic fracture or diastasis. No pelvic bone lesions are seen. IMPRESSION: 1. No acute osseous abnormality. Electronically Signed   By: Titus Dubin M.D.   On: 08/14/2021 12:57   DG Chest Port 1 View  Result Date: 08/14/2021 CLINICAL DATA:  Fall. EXAM: PORTABLE CHEST 1 VIEW COMPARISON:  Chest x-ray dated  April 12, 2021. FINDINGS: The heart size and mediastinal contours are within normal limits. Normal pulmonary vascularity. No focal consolidation, pleural effusion, or pneumothorax. No acute osseous abnormality. Incompletely visualized right proximal humerus ORIF. IMPRESSION: No active disease. Electronically Signed   By: Titus Dubin M.D.   On: 08/14/2021 12:56               LOS: 2 days   Arnell Slivinski  [facility]   Pager on [email or web address]. If 7PM-7AM, please contact night-coverage at [email or web address]     08/16/2021, 12:48 PM

## 2021-08-17 DIAGNOSIS — I480 Paroxysmal atrial fibrillation: Secondary | ICD-10-CM

## 2021-08-17 DIAGNOSIS — I10 Essential (primary) hypertension: Secondary | ICD-10-CM

## 2021-08-17 DIAGNOSIS — E039 Hypothyroidism, unspecified: Secondary | ICD-10-CM

## 2021-08-17 DIAGNOSIS — N1832 Chronic kidney disease, stage 3b: Secondary | ICD-10-CM

## 2021-08-17 LAB — BASIC METABOLIC PANEL
Anion gap: 9 (ref 5–15)
BUN: 33 mg/dL — ABNORMAL HIGH (ref 8–23)
CO2: 25 mmol/L (ref 22–32)
Calcium: 8.9 mg/dL (ref 8.9–10.3)
Chloride: 100 mmol/L (ref 98–111)
Creatinine, Ser: 1.98 mg/dL — ABNORMAL HIGH (ref 0.44–1.00)
GFR, Estimated: 23 mL/min — ABNORMAL LOW (ref >60)
Glucose, Bld: 92 mg/dL (ref 70–99)
Potassium: 4 mmol/L (ref 3.5–5.1)
Sodium: 134 mmol/L — ABNORMAL LOW (ref 135–145)

## 2021-08-17 LAB — GLUCOSE, CAPILLARY
Glucose-Capillary: 120 mg/dL — ABNORMAL HIGH (ref 70–99)
Glucose-Capillary: 117 mg/dL — ABNORMAL HIGH (ref 70–99)
Glucose-Capillary: 177 mg/dL — ABNORMAL HIGH (ref 70–99)
Glucose-Capillary: 152 mg/dL — ABNORMAL HIGH (ref 70–99)
Glucose-Capillary: 137 mg/dL — ABNORMAL HIGH (ref 70–99)

## 2021-08-17 LAB — URINE CULTURE: Culture: 30000 — AB

## 2021-08-17 MED ORDER — SODIUM CHLORIDE 0.9 % IV SOLN
1.0000 g | Freq: Every day | INTRAVENOUS | Status: DC
  Administered 2021-08-17 – 2021-08-18 (×2): 1 g via INTRAVENOUS
  Filled 2021-08-17 (×2): qty 1

## 2021-08-17 MED ORDER — SODIUM CHLORIDE 0.9 % IV SOLN: INTRAVENOUS | Status: DC

## 2021-08-17 NOTE — Progress Notes (Signed)
[facility]  PROGRESS NOTE  BREAH JOA HQI:[id number] DOB: 07-29-1928 DOA: 08/14/2021 PCP: Darreld Mclean, MD   Brief HPI:   87 year old female with medical history of chronic systolic CHF, EF 20 to 13% in July 2022, CKD stage IIIb, diabetes mellitus on insulin, diabetic neuropathy, hypertension, delusional disorder, GERD, paroxysmal atrial fibrillation on Eliquis presents with generalized weakness, unsteady gait and recurrent falls.  She was found to have acute UTI.    Subjective   Patient seen and examined, denies any complaints.    Assessment/Plan:    UTI -Patient was empirically started on IV Rocephin -Urine culture growing 30,000 colonies per mL of Enterobacter aerogenes -Enterobacter sensitive to cefepime and resistant to Rocephin -We will switch from Rocephin to cefepime -Likely home on p.o. antibiotics in a.m.  Metabolic encephalopathy -History of hallucinations -Improved -Patient takes regular at home for hallucinations  Hypomagnesemia -Replete  B12 deficiency -B12 level was 153 -Started on cyanocobalamin 1000 mcg subcutaneously daily  Chronic systolic CHF -EF 20 to 24% as of July 2022 -Compensated  Paroxysmal atrial fibrillation -CHA2DS2VASc score is 8 -Heart rate controlled, continue metoprolol -Continue Eliquis for anticoagulation  Generalized weakness -Unsteady gait; recurrent falls -MRI brain did not show evidence of acute stroke -PT evaluation, fall precautions  Acute kidney injury on CKD stage IV -Baseline creatinine around 1.5-1.7 -Today creatinine elevated 1.91 -Start gentle IV hydration with normal saline at 50 mL/h -Follow BMP in am   Scheduled medications:    apixaban  2.5 mg Oral BID   atorvastatin  40 mg Oral Daily   cyanocobalamin  1,000 mcg Subcutaneous Daily   docusate sodium  100 mg Oral BID   FLUoxetine  40 mg Oral Daily   insulin aspart  0-15 Units Subcutaneous TID WC   insulin glargine-yfgn  20 Units  Subcutaneous QHS   levothyroxine  50 mcg Oral QAC breakfast   metoprolol tartrate  75 mg Oral BID   pantoprazole  40 mg Oral BID   pramipexole  0.25 mg Oral Q supper   pregabalin  100 mg Oral QHS   QUEtiapine  100 mg Oral QHS   senna  1 tablet Oral BID     Data Reviewed:   CBG:  Recent Labs  Lab 08/16/21 1943 08/17/21 0449 08/17/21 0811 08/17/21 1209 08/17/21 1608  GLUCAP 230* 120* 117* 177* 152*    SpO2: 94 %    Vitals:   08/16/21 1927 08/16/21 2057 08/17/21 0330 08/17/21 0932  BP: (!) 114/49 (!) 130/44  (!) 162/58  Pulse: 82 83 64 85  Resp: 19 14    Temp: 98.2 F (36.8 C)  97.6 F (36.4 C)   TempSrc: Oral  Axillary   SpO2: 95% 94%    Weight:      Height:         Intake/Output Summary (Last 24 hours) at 08/17/2021 1826 Last data filed at 08/17/2021 1700 Gross per 24 hour  Intake 587 ml  Output --  Net 587 ml    11/02 1901 - 11/04 0700 In: 609 [P.O.:609] Out: 303 [Urine:302]  Filed Weights   08/14/21 1234  Weight: 72.1 kg    Data Reviewed: Basic Metabolic Panel: Recent Labs  Lab 08/14/21 1234 08/14/21 1252 08/14/21 1646 08/15/21 0241 08/15/21 1459 08/16/21 0320 08/17/21 0244  NA 135 137  --  134*  --  134* 134*  K 4.8 4.7  --  5.0  --  4.8 4.0  CL 96* 96*  --  94*  --  96* 100  CO2 31  --   --  32  --  31 25  GLUCOSE 295* 291*  --  233*  --  166* 92  BUN 33* 35*  --  30*  --  22 33*  CREATININE 1.67* 1.70* 1.62* 1.91*  --  1.48* 1.98*  CALCIUM 9.1  --   --  9.2  --  9.4 8.9  MG  --   --   --  1.5*  --  2.0  --   PHOS  --   --   --  3.1 3.7  --   --    Liver Function Tests: Recent Labs  Lab 08/14/21 1234 08/15/21 0241  AST 19 16  ALT 13 13  ALKPHOS 71 76  BILITOT 0.4 0.2*  PROT 7.0 7.0  ALBUMIN 3.6 3.5   No results for input(s): LIPASE, AMYLASE in the last 168 hours. No results for input(s): AMMONIA in the last 168 hours. CBC: Recent Labs  Lab 08/14/21 1234 08/14/21 1252 08/14/21 1646 08/15/21 0241  WBC 9.7  --  8.3  9.2  HGB 13.6 14.6 13.8 14.1  HCT 43.6 43.0 42.7 44.2  MCV 86.3  --  84.7 85.7  PLT 203  --  186 184   Cardiac Enzymes: No results for input(s): CKTOTAL, CKMB, CKMBINDEX, TROPONINI in the last 168 hours. BNP (last 3 results) Recent Labs    04/06/21 1129 04/12/21 1413 08/14/21 1612  BNP 210.7* 443.0* 155.5*    ProBNP (last 3 results) No results for input(s): PROBNP in the last 8760 hours.  CBG: Recent Labs  Lab 08/16/21 1943 08/17/21 0449 08/17/21 0811 08/17/21 1209 08/17/21 1608  GLUCAP 230* 120* 117* 177* 152*       Radiology Reports  No results found.     Antibiotics: Anti-infectives (From admission, onward)    Start     Dose/Rate Route Frequency Ordered Stop   08/17/21 1100  ceFEPIme (MAXIPIME) 1 g in sodium chloride 0.9 % 100 mL IVPB        1 g 200 mL/hr over 30 Minutes Intravenous Daily 08/17/21 1030     08/14/21 1645  cefTRIAXone (ROCEPHIN) 1 g in sodium chloride 0.9 % 100 mL IVPB  Status:  Discontinued        1 g 200 mL/hr over 30 Minutes Intravenous Every 24 hours 08/14/21 1644 08/17/21 1006         DVT prophylaxis: Apixaban  Code Status: Full code  Family Communication: No family at bedside   Consultants:   Procedures:     Objective    Physical Examination:  General-appears in no acute distress Heart-S1-S2, regular, no murmur auscultated Lungs-clear to auscultation bilaterally, no wheezing or crackles auscultated Abdomen-soft, nontender, no organomegaly Extremities-no edema in the lower extremities Neuro-alert, oriented x3, no focal deficit noted  Status is: Inpatient  Dispo: The patient is from: Home              Anticipated d/c is to: Home              Anticipated d/c date is: 08/18/2021              Patient currently not stable for discharge  Barrier to discharge-on IV antibiotics for UTI  COVID-19 Labs  No results for input(s): DDIMER, FERRITIN, LDH, CRP in the last 72 hours.  Lab Results  Component Value  Date   SARSCOV2NAA NEGATIVE 08/14/2021   [place] NEGATIVE 04/12/2021   [place] NEGATIVE 04/06/2021  [place] NEGATIVE 03/09/2021            Recent Results (from the past 240 hour(s))  Resp Panel by RT-PCR (Flu A&B, Covid) Urine, Clean Catch     Status: None   Collection Time: 08/14/21  3:13 PM   Specimen: Urine, Clean Catch; Nasopharyngeal(NP) swabs in vial transport medium  Result Value Ref Range Status   SARS Coronavirus 2 by RT PCR NEGATIVE NEGATIVE Final    Comment: (NOTE) SARS-CoV-2 target nucleic acids are NOT DETECTED.  The SARS-CoV-2 RNA is generally detectable in upper respiratory specimens during the acute phase of infection. The lowest concentration of SARS-CoV-2 viral copies this assay can detect is 138 copies/mL. A negative result does not preclude SARS-Cov-2 infection and should not be used as the sole basis for treatment or other patient management decisions. A negative result may occur with  improper specimen collection/handling, submission of specimen other than nasopharyngeal swab, presence of viral mutation(s) within the areas targeted by this assay, and inadequate number of viral copies(<138 copies/mL). A negative result must be combined with clinical observations, patient history, and epidemiological information. The expected result is Negative.  Fact Sheet for Patients:  EntrepreneurPulse.com.au  Fact Sheet for Healthcare Providers:  IncredibleEmployment.be  This test is no t yet approved or cleared by the [place] FDA and  has been authorized for detection and/or diagnosis of SARS-CoV-2 by FDA under an Emergency Use Authorization (EUA). This EUA will remain  in effect (meaning this test can be used) for the duration of the COVID-19 declaration under Section 564(b)(1) of the Act, 21 U.S.C.section 360bbb-3(b)(1), unless the authorization is terminated  or revoked sooner.       Influenza A by  PCR NEGATIVE NEGATIVE Final   Influenza B by PCR NEGATIVE NEGATIVE Final    Comment: (NOTE) The Xpert Xpress SARS-CoV-2/FLU/RSV plus assay is intended as an aid in the diagnosis of influenza from Nasopharyngeal swab specimens and should not be used as a sole basis for treatment. Nasal washings and aspirates are unacceptable for Xpert Xpress SARS-CoV-2/FLU/RSV testing.  Fact Sheet for Patients: EntrepreneurPulse.com.au  Fact Sheet for Healthcare Providers: IncredibleEmployment.be  This test is not yet approved or cleared by the [place] FDA and has been authorized for detection and/or diagnosis of SARS-CoV-2 by FDA under an Emergency Use Authorization (EUA). This EUA will remain in effect (meaning this test can be used) for the duration of the COVID-19 declaration under Section 564(b)(1) of the Act, 21 U.S.C. section 360bbb-3(b)(1), unless the authorization is terminated or revoked.  Performed at [facility], [place] [address] [place], [place] [address]   Urine Culture     Status: Abnormal   Collection Time: 08/15/21 12:16 PM   Specimen: Urine, Clean Catch  Result Value Ref Range Status   Specimen Description URINE, CLEAN CATCH  Final   Special Requests   Final    NONE Performed at [facility], [place] [address], [place], [place] [address]    Culture 30,000 COLONIES/mL ENTEROBACTER AEROGENES (A)  Final   Report Status 08/17/2021 FINAL  Final   Organism ID, Bacteria ENTEROBACTER AEROGENES (A)  Final      Susceptibility   Enterobacter aerogenes - MIC*    CEFAZOLIN >=64 RESISTANT Resistant     CEFEPIME 1 SENSITIVE Sensitive     CEFTRIAXONE >=64 RESISTANT Resistant     CIPROFLOXACIN <=0.25 SENSITIVE Sensitive     GENTAMICIN <=1 SENSITIVE Sensitive     IMIPENEM 1 SENSITIVE Sensitive  NITROFURANTOIN 64 INTERMEDIATE Intermediate     TRIMETH/SULFA <=20 SENSITIVE Sensitive     PIP/TAZO >=128 RESISTANT Resistant     * 30,000  COLONIES/mL ENTEROBACTER [place]   [facility] If 7PM-7AM, please contact night-coverage at [email or web address], Office  [phone]   08/17/2021, 6:26 PM  LOS: 3 days

## 2021-08-17 NOTE — Progress Notes (Signed)
Pharmacy Antibiotic Note  LATASHA BUCZKOWSKI is a 87 y.o. female who presented to the hospital 11/02 because of generalized weakness, unsteady gait and recurrent falls and found to have acute UTI. Pharmacy has been consulted for Cefepime dosing.  Plan: Stop ceftriaxone Start cefepime 1g Q24H  F/u signs of clinical improvement   Height: 5\' 4"  (162.6 cm) Weight: 72.1 kg (159 lb) IBW/kg (Calculated) : 54.7  Temp (24hrs), Avg:97.9 F (36.6 C), Min:97.6 F (36.4 C), Max:98.2 F (36.8 C)  Recent Labs  Lab 08/14/21 1234 08/14/21 1252 08/14/21 1646 08/15/21 0241 08/16/21 0320 08/17/21 0244  WBC 9.7  --  8.3 9.2  --   --   CREATININE 1.67* 1.70* 1.62* 1.91* 1.48* 1.98*  LATICACIDVEN 1.4  --   --   --   --   --     Estimated Creatinine Clearance: 18 mL/min (A) (by C-G formula based on SCr of 1.98 mg/dL (H)).    No Known Allergies  Antimicrobials this admission: Ceftriaxone 11/01 >> 11/04  Microbiology results: 11/02 UCx: enterobacter aerogenes    Thank you for allowing pharmacy to be a part of this patient's care.  Ardyth Harps, PharmD Clinical Pharmacist

## 2021-08-17 NOTE — Plan of Care (Signed)

## 2021-08-17 NOTE — Evaluation (Signed)
Physical Therapy Evaluation Patient Details Name: Melissa [name] MRN: [id number] DOB: [birth year] Today's Date: 08/17/2021  History of Present Illness  pt is a 87 y/o female admitted 11/1 after falling backward at home, hitting her head, back and hip.  Post addition of seroquel to her meds recently, pt has experienced generalized weakness, unsteady gait, recurrent falls and rrinary frequency.  Work up found and acute UTI.  PMHx of systolic CHF, CKD, DM, diabetic neuropathy, HTN, delusional d/o and paroxysmal afib.  Clinical Impression  Pt admitted with/for generalized weakness and falls due to UTI.  Pt needing min guard overall, but expect steady improvement.  Pt currently limited functionally due to the problems listed below.  (see problems list.)  Pt will benefit from PT to maximize function and safety to be able to get home safely with available assist.        Recommendations for follow up therapy are one component of a multi-disciplinary discharge planning process, led by the attending physician.  Recommendations may be updated based on patient status, additional functional criteria and insurance authorization.  Follow Up Recommendations Home health PT    Assistance Recommended at Discharge Intermittent Supervision/Assistance  Functional Status Assessment Patient has had a recent decline in their functional status and demonstrates the ability to make significant improvements in function in a reasonable and predictable amount of time.  Equipment Recommendations  None recommended by PT    Recommendations for Other Services       Precautions / Restrictions Precautions Precautions: Fall      Mobility  Bed Mobility               General bed mobility comments: OOB in recliner on arrival    Transfers Overall transfer level: Needs assistance Equipment used: Rolling walker (2 wheels) Transfers: Sit to/from Stand Sit to Stand: Min guard                 Ambulation/Gait Ambulation/Gait assistance: Min guard Gait Distance (Feet): 300 Feet Assistive device: Rolling walker (2 wheels) Gait Pattern/deviations: Step-through pattern   Gait velocity interpretation: <1.8 ft/sec, indicate of risk for recurrent falls General Gait Details: guarded, but generally steady using an AD that she is not familiar with.  Pt tends to look down and not scan her environment well. Macular degeneration may play a part in this.  Stairs            Wheelchair Mobility    Modified Rankin (Stroke Patients Only)       Balance Overall balance assessment: Needs assistance Sitting-balance support: No upper extremity supported;Feet supported Sitting balance-Leahy Scale: Good     Standing balance support: Bilateral upper extremity supported;During functional activity Standing balance-Leahy Scale: Fair Standing balance comment: stood statically while RW was adjusted.                             Pertinent Vitals/Pain Pain Assessment: Faces Faces Pain Scale: Hurts a little bit Pain Location: L lower paraspinal area Pain Descriptors / Indicators: Aching Pain Intervention(s): Monitored during session    Home Living Family/patient expects to be discharged to:: Private residence Living Arrangements: Children;Other relatives Available Help at Discharge: Family;Available 24 hours/day Type of Home: Apartment Home Access: Level entry       Home Layout: One level Home Equipment: Rollator (4 wheels);Cane - single point;Tub bench;Grab bars - tub/shower Additional Comments: pt with great family support    Prior Function Prior Level of Function :  Independent/Modified Independent                     Hand Dominance   Dominant Hand: Right    Extremity/Trunk Assessment   Upper Extremity Assessment Upper Extremity Assessment: Overall WFL for tasks assessed;Generalized weakness    Lower Extremity Assessment Lower Extremity  Assessment: Generalized weakness;Overall [facility] for tasks assessed    Cervical / Trunk Assessment Cervical / Trunk Assessment: Kyphotic  Communication   Communication: No difficulties;HOH  Cognition Arousal/Alertness: Awake/alert Behavior During Therapy: WFL for tasks assessed/performed Overall Cognitive Status: Within Functional Limits for tasks assessed                                          General Comments      Exercises     Assessment/Plan    PT Assessment Patient needs continued PT services  PT Problem List Decreased strength;Decreased activity tolerance;Decreased balance;Decreased mobility       PT Treatment Interventions Gait training;DME instruction;Functional mobility training;Therapeutic activities;Balance training;Patient/family education    PT Goals (Current goals can be found in the Care Plan section)  Acute Rehab PT Goals Patient Stated Goal: home as soon as medically ready PT Goal Formulation: With patient Time For Goal Achievement: 08/24/21 Potential to Achieve Goals: Good    Frequency Min 3X/week   Barriers to discharge        Co-evaluation               AM-PAC PT "6 Clicks" Mobility  Outcome Measure Help needed turning from your back to your side while in a flat bed without using bedrails?: A Little Help needed moving from lying on your back to sitting on the side of a flat bed without using bedrails?: A Little Help needed moving to and from a bed to a chair (including a wheelchair)?: A Little Help needed standing up from a chair using your arms (e.g., wheelchair or bedside chair)?: A Little Help needed to walk in hospital room?: A Little Help needed climbing 3-5 steps with a railing? : A Little 6 Click Score: 18    End of Session   Activity Tolerance: Patient tolerated treatment well Patient left: in chair;with call bell/phone within reach;with family/visitor present Nurse Communication: Mobility status PT Visit  Diagnosis: Muscle weakness (generalized) (M62.81);Unsteadiness on feet (R26.81);Other abnormalities of gait and mobility (R26.89)    Time: 1249-1316 PT Time Calculation (min) (ACUTE ONLY): 27 min   Charges:   PT Evaluation $PT Eval Low Complexity: 1 Low PT Treatments $Gait Training: 8-22 mins        08/17/2021  Ginger Carne., PT Acute Rehabilitation Services [phone]  (pager) [phone]  (office)  Tessie Fass Tristina Sahagian 08/17/2021, 1:30 PM

## 2021-08-18 LAB — BASIC METABOLIC PANEL
Anion gap: 6 (ref 5–15)
BUN: 34 mg/dL — ABNORMAL HIGH (ref 8–23)
CO2: 28 mmol/L (ref 22–32)
Calcium: 8.9 mg/dL (ref 8.9–10.3)
Chloride: 101 mmol/L (ref 98–111)
Creatinine, Ser: 1.53 mg/dL — ABNORMAL HIGH (ref 0.44–1.00)
GFR, Estimated: 32 mL/min — ABNORMAL LOW (ref >60)
Glucose, Bld: 103 mg/dL — ABNORMAL HIGH (ref 70–99)
Potassium: 5 mmol/L (ref 3.5–5.1)
Sodium: 135 mmol/L (ref 135–145)

## 2021-08-18 LAB — GLUCOSE, CAPILLARY
Glucose-Capillary: 122 mg/dL — ABNORMAL HIGH (ref 70–99)
Glucose-Capillary: 140 mg/dL — ABNORMAL HIGH (ref 70–99)

## 2021-08-18 MED ORDER — CIPROFLOXACIN HCL 250 MG PO TABS: 250.0000 mg | ORAL_TABLET | Freq: Every day | ORAL | 0 refills | Status: AC

## 2021-08-18 MED ORDER — VITAMIN B-12 1000 MCG PO TABS: 1000.0000 ug | ORAL_TABLET | Freq: Every day | ORAL | 3 refills | Status: DC

## 2021-08-18 MED ORDER — SODIUM ZIRCONIUM CYCLOSILICATE 5 G PO PACK
5.0000 g | PACK | Freq: Once | ORAL | Status: AC
  Administered 2021-08-18: 5 g via ORAL
  Filled 2021-08-18 (×2): qty 1

## 2021-08-18 MED ORDER — CIPROFLOXACIN HCL 250 MG PO TABS
250.0000 mg | ORAL_TABLET | Freq: Every day | ORAL | Status: DC
  Administered 2021-08-18: 250 mg via ORAL
  Filled 2021-08-18: qty 1

## 2021-08-18 MED ORDER — CIPROFLOXACIN HCL 250 MG PO TABS: 250.0000 mg | ORAL_TABLET | Freq: Two times a day (BID) | ORAL | Status: DC

## 2021-08-18 NOTE — TOC Transition Note (Signed)
Transition of Care [facility]) - CM/SW Discharge Note   Patient Details  Name: Melissa [name] MRN: [id number] Date of Birth: [birth year]  [facility] CM/SW Contact:  Carles Collet, RN Phone Number: 08/18/2021, 2:28 PM   Clinical Narrative:    [name] w patient's daughter Melissa [name]. She is agreeable to [place] she would like to avoid. Reviewed [place] agencies ratings on StartupExpense.be. Chose [place], referral accepted by Amy. No DME needs, family will transport home.      [place],[name] Daughter   [phone]      Final next level of care: [place] Barriers to Discharge: No Barriers Identified   Patient Goals and CMS Choice Patient states their goals for this hospitalization and ongoing recovery are:: Per daughter- To be able to walk on her own and to get rid of the UTI. CMS Medicare.gov Compare Post Acute Care list provided to:: Other (Comment Required) Choice offered to / list presented to : Adult Children  Discharge Placement                       Discharge Plan and Services In-house Referral: NA Discharge Planning Services: CM Consult            DME Arranged: N/A         HH Arranged: PT, OT HH Agency: [place] Date [place]: 08/18/21 Time [place]: [place] Representative spoke with at [place]: Amy  Social Determinants of Health ([place]) Interventions     Readmission Risk Interventions Readmission Risk Prevention Plan 08/16/2021 04/16/2021  Transportation Screening Complete Complete  PCP or Specialist Appt within 3-5 Days - Complete  HRI or [place] - Complete  Social Work Consult for [place] Planning/Counseling - Complete  Palliative Care Screening - Not Applicable  Medication Review Press photographer) Complete Complete  PCP or Specialist appointment within 3-5 days of discharge Complete -  [place] or Home Care Consult Complete -  SW Recovery Care/Counseling Consult  Complete -  Some recent data might be hidden

## 2021-08-18 NOTE — Discharge Summary (Addendum)
Physician Discharge Summary  Melissa [name] GYF:[id number] DOB: 04-27-1928 DOA: 08/14/2021  PCP: Darreld Mclean, MD  Admit date: 08/14/2021 Discharge date: 08/18/2021  Time spent: 60 minutes  Recommendations for Outpatient Follow-up:  Follow-up PCP in 2 weeks   Discharge Diagnoses:  Principal Problem:   Generalized weakness Active Problems:   Type 2 diabetes mellitus with diabetic neuropathy, with long-term current use of insulin (HCC)   HLD (hyperlipidemia)   Hypothyroidism, acquired   Essential hypertension   CKD stage IIIb (HCC)   Paroxysmal atrial fibrillation (HCC)   Tremor, essential   History of CVA- old cerebellar infarcts noted on MRI   Chronic systolic heart failure (HCC)   RLS (restless legs syndrome)   Obstructive lung disease (HCC)   Delusional disorder ([facility])   Acute lower UTI   Vitamin B12 deficiency   Discharge Condition: Stable  Diet recommendation: Heart healthy diet  Filed Weights   08/14/21 1234  Weight: 72.1 kg    History of present illness:  87 year old female with medical history of chronic systolic CHF, EF 20 to 91% in July 2022, CKD stage IIIb, diabetes mellitus on insulin, diabetic neuropathy, hypertension, delusional disorder, GERD, paroxysmal atrial fibrillation on Eliquis presents with generalized weakness, unsteady gait and recurrent falls.  She was found to have acute UTI.    Hospital Course:   UTI -Patient was empirically started on IV Rocephin -Urine culture growing 30,000 colonies per mL of Enterobacter aerogenes -Enterobacter sensitive to cefepime and resistant to Rocephin -We will switch from Rocephin to cefepime -We will discharge home on Cipro to 50 mg p.o. daily for 3 days   Metabolic encephalopathy -History of hallucinations -Improved  Generalized weakness -Secondary to above -We will discharge home with home health PT/OT    Hypomagnesemia -Replete   B12 deficiency -B12 level was 153 -Started on cyanocobalamin  1000 mcg subcutaneously daily -We will discharge on cyanocobalamin 1000 mcg p.o. daily   Chronic systolic CHF -EF 20 to 63% as of July 2022 -Compensated -Continue home dose of Lasix 40 mg p.o. daily   Paroxysmal atrial fibrillation -CHA2DS2VASc score is 8 -Heart rate controlled, continue metoprolol -Continue Eliquis for anticoagulation   Generalized weakness -Unsteady gait; recurrent falls -MRI brain did not show evidence of acute stroke -PT evaluation, fall precautions   Acute kidney injury on CKD stage IV -Baseline creatinine around 1.5-1.7 -Today creatinine improved to 1.53 -At baseline  Elevated potassium -Potassium was found to be 5.0 today -We will give 1 dose of Lokelma 5 g p.o. x1 before discharge  Procedures:   Consultations: Cardiology  Discharge Exam: Vitals:   08/18/21 0819 08/18/21 1500  BP: 116/60 (!) 149/61  Pulse:  80  Resp: 15 18  Temp: 98.3 F (36.8 C) (!) 97.5 F (36.4 C)  SpO2: 98% 96%    General: Appears in no acute distress Cardiovascular: S1-S2, regular, no murmur auscultated Respiratory: Clear to auscultation bilaterally  Discharge Instructions   Discharge Instructions     Diet - low sodium heart healthy   Complete by: As directed    Increase activity slowly   Complete by: As directed       Allergies as of 08/18/2021   No Known Allergies      Medication List     STOP taking these medications    cephALEXin 500 MG capsule Commonly known as: KEFLEX   potassium chloride SA 20 MEQ tablet Commonly known as: KLOR-CON       TAKE these medications    acetaminophen  500 MG tablet Commonly known as: TYLENOL Take 500-1,000 mg by mouth every 6 (six) hours as needed (pain.).   atorvastatin 40 MG tablet Commonly known as: LIPITOR TAKE 1 TABLET DAILY What changed: when to take this   blood glucose meter kit and supplies Kit Dispense based on patient and insurance preference. Use up to four times daily as directed.    Blood Glucose Monitoring Suppl Devi Dispense one meter to use and monitor glucose as needed   cetirizine 10 MG tablet Commonly known as: ZYRTEC Take 10 mg by mouth in the morning.   ciprofloxacin 250 MG tablet Commonly known as: CIPRO Take 1 tablet (250 mg total) by mouth daily for 3 days. Start taking on: August 19, 2021   Eliquis 2.5 MG Tabs tablet Generic drug: apixaban TAKE 1 TABLET TWICE A DAY What changed: how much to take   Fish Oil 1000 MG Caps Take 1,000 mg by mouth at bedtime.   FLUoxetine 40 MG capsule Commonly known as: PROZAC Take 1 capsule (40 mg total) by mouth daily.   Flutter Devi 1 Device by Does not apply route as needed.   furosemide 40 MG tablet Commonly known as: LASIX TAKE 1 TABLET DAILY   HumaLOG KwikPen 100 UNIT/ML KwikPen Generic drug: insulin lispro Inject 6 units under the skin daily with supper. What changed:  how much to take how to take this when to take this additional instructions   ICaps Areds 2 Caps Take 1 capsule by mouth 2 (two) times daily.   Integra 62.5-62.5-40-3 MG Caps TAKE 1 CAPSULE DAILY   Levemir FlexTouch 100 UNIT/ML FlexPen Generic drug: insulin detemir Inject 25 Units into the skin at bedtime.   levothyroxine 50 MCG tablet Commonly known as: SYNTHROID TAKE 1 TABLET DAILY BEFORE BREAKFAST   metoprolol tartrate 50 MG tablet Commonly known as: LOPRESSOR Take 1.5 tablets (75 mg total) by mouth 2 (two) times daily.   OneTouch Delica Lancets 88Q Misc Use to check blood sugar 4 times per day dx code E11.29   OneTouch Verio test strip Generic drug: glucose blood USE AS INSTRUCTED TO CHECK BLOOD SUGAR 4 TIMES PER DAY   Ozempic (0.25 or 0.5 MG/DOSE) 2 MG/1.5ML Sopn Generic drug: Semaglutide(0.25 or 0.5MG/DOS) Inject 0.5 mg into the skin every Monday.   pantoprazole 40 MG tablet Commonly known as: PROTONIX Take 1 tablet (40 mg total) by mouth 2 (two) times daily. Continue taking once a day after finishing  the course of twice a day for  2 months   Perforomist 20 MCG/2ML nebulizer solution Generic drug: formoterol Take 2 mLs (20 mcg total) by nebulization 2 (two) times daily.   polyethylene glycol 17 g packet Commonly known as: MIRALAX / GLYCOLAX Take 17 g by mouth daily as needed (constipation.).   pramipexole 0.25 MG tablet Commonly known as: Mirapex Take 1 tablet (0.25 mg total) by mouth daily with supper. Take 1 tablet at 5 pm   pregabalin 50 MG capsule Commonly known as: Lyrica Take 2 capsules (100 mg total) by mouth at bedtime.   QUEtiapine 50 MG tablet Commonly known as: SEROQUEL ONE AT NIGHT FOR 2 WEEKS, THEN TAKE 2 AT NIGHT What changed: See the new instructions.   sucralfate 1 GM/10ML suspension Commonly known as: CARAFATE Take 10 mLs (1 g total) by mouth 2 (two) times daily. Use as needed for stomach pain What changed:  when to take this reasons to take this additional instructions   traZODone 100 MG tablet Commonly known as:  DESYREL TAKE 1 TABLET AT BEDTIME   vitamin B-12 1000 MCG tablet Commonly known as: CYANOCOBALAMIN Take 1 tablet (1,000 mcg total) by mouth daily.   Vitamin D3 25 MCG (1000 UT) Caps Take 1,000 Units by mouth every evening.   Yupelri 175 MCG/3ML nebulizer solution Generic drug: revefenacin Take 3 mLs (175 mcg total) by nebulization daily. What changed:  when to take this reasons to take this       No Known Allergies  Follow-up Information     Copland, Gay Filler, MD Follow up in 2 week(s).   Specialty: Family Medicine Contact information: [address] [place] [address] [phone]         [name], MD .   Specialty: Cardiology Contact information: [phone] [address] [address] [place], [facility] Follow up.   Specialty: Home Health Services Why: Now goes by [facility]. They will call you in 1-2 days to set up your first home  visit. Contact information: [place] [address] [phone]                  The results of significant diagnostics from this hospitalization (including imaging, microbiology, ancillary and laboratory) are listed below for reference.    Significant Diagnostic Studies: DG Thoracic Spine 2 View  Result Date: 08/14/2021 CLINICAL DATA:  Fall, back pain EXAM: THORACIC SPINE 2 VIEWS; LUMBAR SPINE - COMPLETE 4+ VIEW COMPARISON:  None. FINDINGS: No fracture or dislocation of the thoracic or lumbar spine. There is focally moderate disc space height loss and osteophytosis at L5-S1, with otherwise very well-preserved disc spaces for age. Mild, multilevel facet degenerative change. Included chest is unremarkable. Nonobstructive pattern of overlying bowel gas. Aortic atherosclerosis. IMPRESSION: 1. No fracture or dislocation of the thoracic or lumbar spine. 2. There is focally moderate disc space height loss and osteophytosis at L5-S1, with otherwise very well-preserved disc spaces for age. Mild, multilevel facet degenerative change. Electronically Signed   By: Delanna Ahmadi M.D.   On: 08/14/2021 14:03   DG Lumbar Spine Complete  Result Date: 08/14/2021 CLINICAL DATA:  Fall, back pain EXAM: THORACIC SPINE 2 VIEWS; LUMBAR SPINE - COMPLETE 4+ VIEW COMPARISON:  None. FINDINGS: No fracture or dislocation of the thoracic or lumbar spine. There is focally moderate disc space height loss and osteophytosis at L5-S1, with otherwise very well-preserved disc spaces for age. Mild, multilevel facet degenerative change. Included chest is unremarkable. Nonobstructive pattern of overlying bowel gas. Aortic atherosclerosis. IMPRESSION: 1. No fracture or dislocation of the thoracic or lumbar spine. 2. There is focally moderate disc space height loss and osteophytosis at L5-S1, with otherwise very well-preserved disc spaces for age. Mild, multilevel facet degenerative change. Electronically Signed    By: Delanna Ahmadi M.D.   On: 08/14/2021 14:03   CT HEAD WO CONTRAST  Result Date: 08/14/2021 CLINICAL DATA:  Head trauma, minor (Age >= 65y); Neck trauma (Age >= 65y). Fall today. On blood thinners. EXAM: CT HEAD WITHOUT CONTRAST CT CERVICAL SPINE WITHOUT CONTRAST TECHNIQUE: Multidetector CT imaging of the head and cervical spine was performed following the standard protocol without intravenous contrast. Multiplanar CT image reconstructions of the cervical spine were also generated. COMPARISON:  CT head and cervical spine 03/09/2021. FINDINGS: CT HEAD FINDINGS Brain: There is no evidence of an acute infarct, intracranial hemorrhage, mass, midline shift, or extra-axial fluid collection. The ventricles and sulci  are within normal limits for age. Small chronic infarcts are again seen in the left thalamus and both cerebellar hemispheres. Vascular: Calcified atherosclerosis at the skull base. No hyperdense vessel. Skull: No fracture or suspicious osseous lesion. Sinuses/Orbits: Clear paranasal sinuses. Small chronic bilateral mastoid effusions. Bilateral cataract extraction. Other: None. CT CERVICAL SPINE FINDINGS Alignment: Chronic straightening of the normal cervical lordosis with unchanged grade 1 anterolisthesis of C3 on C4 and C4 on C5. Skull base and vertebrae: No acute fracture or suspicious osseous lesion. Moderate median C1-2 arthropathy with noncompressive ligamentous thickening and calcification posterior to the dens. Soft tissues and spinal canal: No prevertebral fluid or swelling. No visible canal hematoma. Disc levels: Moderate to severe disc degeneration at C5-6 and C6-7. Advanced upper cervical facet arthrosis, most severe on the right at C3-4. No evidence of high-grade spinal canal or neural foraminal stenosis. Upper chest: No apical lung consolidation or mass. Other: Mild-to-moderate calcific atherosclerosis at the carotid bifurcations. IMPRESSION: 1. No evidence of acute intracranial abnormality.  2. Chronic small thalamic and cerebellar infarcts. 3. No acute cervical spine fracture. Electronically Signed   By: Logan Bores M.D.   On: 08/14/2021 13:21   CT CERVICAL SPINE WO CONTRAST  Result Date: 08/14/2021 CLINICAL DATA:  Head trauma, minor (Age >= 65y); Neck trauma (Age >= 65y). Fall today. On blood thinners. EXAM: CT HEAD WITHOUT CONTRAST CT CERVICAL SPINE WITHOUT CONTRAST TECHNIQUE: Multidetector CT imaging of the head and cervical spine was performed following the standard protocol without intravenous contrast. Multiplanar CT image reconstructions of the cervical spine were also generated. COMPARISON:  CT head and cervical spine 03/09/2021. FINDINGS: CT HEAD FINDINGS Brain: There is no evidence of an acute infarct, intracranial hemorrhage, mass, midline shift, or extra-axial fluid collection. The ventricles and sulci are within normal limits for age. Small chronic infarcts are again seen in the left thalamus and both cerebellar hemispheres. Vascular: Calcified atherosclerosis at the skull base. No hyperdense vessel. Skull: No fracture or suspicious osseous lesion. Sinuses/Orbits: Clear paranasal sinuses. Small chronic bilateral mastoid effusions. Bilateral cataract extraction. Other: None. CT CERVICAL SPINE FINDINGS Alignment: Chronic straightening of the normal cervical lordosis with unchanged grade 1 anterolisthesis of C3 on C4 and C4 on C5. Skull base and vertebrae: No acute fracture or suspicious osseous lesion. Moderate median C1-2 arthropathy with noncompressive ligamentous thickening and calcification posterior to the dens. Soft tissues and spinal canal: No prevertebral fluid or swelling. No visible canal hematoma. Disc levels: Moderate to severe disc degeneration at C5-6 and C6-7. Advanced upper cervical facet arthrosis, most severe on the right at C3-4. No evidence of high-grade spinal canal or neural foraminal stenosis. Upper chest: No apical lung consolidation or mass. Other:  Mild-to-moderate calcific atherosclerosis at the carotid bifurcations. IMPRESSION: 1. No evidence of acute intracranial abnormality. 2. Chronic small thalamic and cerebellar infarcts. 3. No acute cervical spine fracture. Electronically Signed   By: Logan Bores M.D.   On: 08/14/2021 13:21   MR ANGIO HEAD WO CONTRAST  Result Date: 08/14/2021 CLINICAL DATA:  Dizziness EXAM: MRA HEAD WITHOUT CONTRAST TECHNIQUE: Angiographic images of the Circle of Willis were acquired using MRA technique without intravenous contrast. COMPARISON:  No pertinent prior exam. FINDINGS: POSTERIOR CIRCULATION: --Vertebral arteries: Normal --Inferior cerebellar arteries: Normal. --Basilar artery: Normal. --Superior cerebellar arteries: Normal. --Posterior cerebral arteries: Normal. ANTERIOR CIRCULATION: --Intracranial internal carotid arteries: Normal. --Anterior cerebral arteries (ACA): Normal. --Middle cerebral arteries (MCA): Normal. ANATOMIC VARIANTS: Bilateral fetal origins of the posterior cerebral arteries. IMPRESSION: Normal intracranial MRA. Electronically Signed  By: Ulyses Jarred M.D.   On: 08/14/2021 21:13   MR Angiogram Neck W or Wo Contrast  Result Date: 08/14/2021 CLINICAL DATA:  Dizziness EXAM: MRA NECK WITHOUT AND WITH CONTRAST TECHNIQUE: Multiplanar and multiecho pulse sequences of the neck were obtained without and with intravenous contrast. Angiographic images of the neck were obtained using MRA technique without and with intravenous contrast. CONTRAST:  7.87m GADAVIST GADOBUTROL 1 MMOL/ML IV SOLN COMPARISON:  None. FINDINGS: Normal aortic arch branching pattern. Limited visualization of the proximal common carotid arteries. The right carotid bifurcation and internal carotid artery are normal. There is atherosclerosis at the left carotid bifurcation causing 50% stenosis of the proximal left ICA. The distal left ICA is normal. Vertebral arteries are left-dominant and normal. IMPRESSION: 1. Approximately 50% stenosis  of the proximal left ICA due to atherosclerosis. 2. No stenosis of the right carotid bifurcation or right internal carotid artery. 3. Patent vertebral arteries. Electronically Signed   By: KUlyses JarredM.D.   On: 08/14/2021 21:21   MR BRAIN WO CONTRAST  Result Date: 08/14/2021 CLINICAL DATA:  Dizziness EXAM: MRI HEAD WITHOUT CONTRAST TECHNIQUE: Multiplanar, multiecho pulse sequences of the brain and surrounding structures were obtained without intravenous contrast. COMPARISON:  11/14/2015 FINDINGS: Brain: No acute infarct, mass effect or extra-axial collection. No acute or chronic hemorrhage. There is multifocal hyperintense T2-weighted signal within the white matter. Generalized volume loss without a clear lobar predilection. 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 Vascular: Major flow voids are preserved. Skull and upper cervical spine: Normal calvarium and skull base. Visualized upper cervical spine and soft tissues are normal. Sinuses/Orbits:No paranasal sinus fluid levels or advanced mucosal thickening. No mastoid or middle ear effusion. Normal orbits. IMPRESSION: 1. No acute intracranial abnormality. 2. Old bilateral cerebellar infarcts and findings of chronic microvascular disease. Cerebral Atrophy (ICD10-G31.9). Electronically Signed   By: KUlyses JarredM.D.   On: 08/14/2021 21:06   DG Pelvis Portable  Result Date: 08/14/2021 CLINICAL DATA:  Fall. EXAM: PORTABLE PELVIS 1-2 VIEWS COMPARISON:  CT abdomen pelvis dated Mar 09, 2021. FINDINGS: There is no evidence of pelvic fracture or diastasis. No pelvic bone lesions are seen. IMPRESSION: 1. No acute osseous abnormality. Electronically Signed   By: WTitus DubinM.D.   On: 08/14/2021 12:57   DG Chest Port 1 View  Result Date: 08/14/2021 CLINICAL DATA:  Fall. EXAM: PORTABLE CHEST 1 VIEW COMPARISON:  Chest x-ray dated April 12, 2021. FINDINGS: The heart size and mediastinal contours are within normal limits. Normal pulmonary vascularity. No  focal consolidation, pleural effusion, or pneumothorax. No acute osseous abnormality. Incompletely visualized right proximal humerus ORIF. IMPRESSION: No active disease. Electronically Signed   By: WTitus DubinM.D.   On: 08/14/2021 12:56    Microbiology: Recent Results (from the past 240 hour(s))  Resp Panel by RT-PCR (Flu A&B, Covid) Urine, Clean Catch     Status: None   Collection Time: 08/14/21  3:13 PM   Specimen: Urine, Clean Catch; Nasopharyngeal(NP) swabs in vial transport medium  Result Value Ref Range Status   SARS Coronavirus 2 by RT PCR NEGATIVE NEGATIVE Final    Comment: (NOTE) SARS-CoV-2 target nucleic acids are NOT DETECTED.  The SARS-CoV-2 RNA is generally detectable in upper respiratory specimens during the acute phase of infection. The lowest concentration of SARS-CoV-2 viral copies this assay can detect is 138 copies/mL. A negative result does not preclude SARS-Cov-2 infection and should not be used as the sole basis for treatment or other patient management decisions. A  negative result may occur with  improper specimen collection/handling, submission of specimen other than nasopharyngeal swab, presence of viral mutation(s) within the areas targeted by this assay, and inadequate number of viral copies(<138 copies/mL). A negative result must be combined with clinical observations, patient history, and epidemiological information. The expected result is Negative.  Fact Sheet for Patients:  EntrepreneurPulse.com.au  Fact Sheet for Healthcare Providers:  IncredibleEmployment.be  This test is no t yet approved or cleared by the [place] FDA and  has been authorized for detection and/or diagnosis of SARS-CoV-2 by FDA under an Emergency Use Authorization (EUA). This EUA will remain  in effect (meaning this test can be used) for the duration of the COVID-19 declaration under Section 564(b)(1) of the Act, 21 U.S.C.section  360bbb-3(b)(1), unless the authorization is terminated  or revoked sooner.       Influenza A by PCR NEGATIVE NEGATIVE Final   Influenza B by PCR NEGATIVE NEGATIVE Final    Comment: (NOTE) The Xpert Xpress SARS-CoV-2/FLU/RSV plus assay is intended as an aid in the diagnosis of influenza from Nasopharyngeal swab specimens and should not be used as a sole basis for treatment. Nasal washings and aspirates are unacceptable for Xpert Xpress SARS-CoV-2/FLU/RSV testing.  Fact Sheet for Patients: EntrepreneurPulse.com.au  Fact Sheet for Healthcare Providers: IncredibleEmployment.be  This test is not yet approved or cleared by the [place] FDA and has been authorized for detection and/or diagnosis of SARS-CoV-2 by FDA under an Emergency Use Authorization (EUA). This EUA will remain in effect (meaning this test can be used) for the duration of the COVID-19 declaration under Section 564(b)(1) of the Act, 21 U.S.C. section 360bbb-3(b)(1), unless the authorization is terminated or revoked.  Performed at [facility], [place] [address], [place], [place] [address]   Urine Culture     Status: Abnormal   Collection Time: 08/15/21 12:16 PM   Specimen: Urine, Clean Catch  Result Value Ref Range Status   Specimen Description URINE, CLEAN CATCH  Final   Special Requests   Final    NONE Performed at [facility], [place] [address] [place], [place] [address]    Culture 30,000 COLONIES/mL ENTEROBACTER AEROGENES (A)  Final   Report Status 08/17/2021 FINAL  Final   Organism ID, Bacteria ENTEROBACTER AEROGENES (A)  Final      Susceptibility   Enterobacter aerogenes - MIC*    CEFAZOLIN >=64 RESISTANT Resistant     CEFEPIME 1 SENSITIVE Sensitive     CEFTRIAXONE >=64 RESISTANT Resistant     CIPROFLOXACIN <=0.25 SENSITIVE Sensitive     GENTAMICIN <=1 SENSITIVE Sensitive     IMIPENEM 1 SENSITIVE Sensitive     NITROFURANTOIN 64 INTERMEDIATE Intermediate      TRIMETH/SULFA <=20 SENSITIVE Sensitive     PIP/TAZO >=128 RESISTANT Resistant     * 30,000 COLONIES/mL ENTEROBACTER AEROGENES     Labs: Basic Metabolic Panel: Recent Labs  Lab 08/14/21 1234 08/14/21 1252 08/14/21 1646 08/15/21 0241 08/15/21 1459 08/16/21 0320 08/17/21 0244 08/18/21 1212  NA 135 137  --  134*  --  134* 134* 135  K 4.8 4.7  --  5.0  --  4.8 4.0 5.0  CL 96* 96*  --  94*  --  96* 100 101  CO2 31  --   --  32  --  _0 GLUCOSE 295* 291*  --  233*  --  166* 92 103*  BUN 33* 35*  --  30*  --  22 33* 34*  CREATININE 1.67* 1.70* 1.62* 1.91*  --  1.48* 1.98* 1.53*  CALCIUM 9.1  --   --  9.2  --  9.4 8.9 8.9  MG  --   --   --  1.5*  --  2.0  --   --   PHOS  --   --   --  3.1 3.7  --   --   --    Liver Function Tests: Recent Labs  Lab 08/14/21 1234 08/15/21 0241  AST 19 16  ALT 13 13  ALKPHOS 71 76  BILITOT 0.4 0.2*  PROT 7.0 7.0  ALBUMIN 3.6 3.5   No results for input(s): LIPASE, AMYLASE in the last 168 hours. No results for input(s): AMMONIA in the last 168 hours. CBC: Recent Labs  Lab 08/14/21 1234 08/14/21 1252 08/14/21 1646 08/15/21 0241  WBC 9.7  --  8.3 9.2  HGB 13.6 14.6 13.8 14.1  HCT 43.6 43.0 42.7 44.2  MCV 86.3  --  84.7 85.7  PLT 203  --  186 184   Cardiac Enzymes: No results for input(s): CKTOTAL, CKMB, CKMBINDEX, TROPONINI in the last 168 hours. BNP: BNP (last 3 results) Recent Labs    04/06/21 1129 04/12/21 1413 08/14/21 1612  BNP 210.7* 443.0* 155.5*    ProBNP (last 3 results) No results for input(s): PROBNP in the last 8760 hours.  CBG: Recent Labs  Lab 08/17/21 1209 08/17/21 1608 08/17/21 2149 08/18/21 0810 08/18/21 1122  GLUCAP 177* 152* 137* 122* 140*       Signed:  Oswald Hillock MD.  [facility] 08/18/2021, 3:13 PM

## 2021-08-20 ENCOUNTER — Telehealth: Payer: Self-pay

## 2021-08-20 ENCOUNTER — Ambulatory Visit: Payer: BLUE CROSS/BLUE SHIELD | Admitting: Family Medicine

## 2021-08-20 NOTE — Telephone Encounter (Signed)
Transition Care Management Follow-up Telephone Call Date of discharge and from where: 08/18/2021-[place] How have you been since you were released from the hospital? Pretty good per daughter Any questions or concerns? No  Items Reviewed: Did the pt receive and understand the discharge instructions provided? Yes  Medications obtained and verified? Yes  Other? Yes  Any new allergies since your discharge? No  Dietary orders reviewed? Yes Do you have support at home? Yes   Home Care and Equipment/Supplies: Were home health services ordered? yes If so, what is the name of the agency? [facility]  Has the agency set up a time to come to the patient's home? yes Were any new equipment or medical supplies ordered?  No What is the name of the medical supply agency? N/a Were you able to get the supplies/equipment? not applicable Do you have any questions related to the use of the equipment or supplies? N/a  Functional Questionnaire: (I = Independent and D = Dependent) ADLs: I with assistance  Bathing/Dressing- I with assistance  Meal Prep- D  Eating- I  Maintaining continence- I with assistance   Transferring/Ambulation- I with cane  Managing Meds- I with assistance  Follow up appointments reviewed:  PCP Hospital f/u appt confirmed? Yes  Scheduled to see Dr. Lorelei Pont on 09/05/2021 @ 4:00. [facility] f/u appt confirmed? Yes  Scheduled to see Dr. Gillian Shields on 08/24/2022 @ 3:20. Are transportation arrangements needed? No  If their condition worsens, is the pt aware to call PCP or go to the Emergency Dept.? Yes Was the patient provided with contact information for the PCP's office or ED? Yes Was to pt encouraged to call back with questions or concerns? Yes

## 2021-08-22 ENCOUNTER — Telehealth: Payer: Self-pay | Admitting: Family Medicine

## 2021-08-22 ENCOUNTER — Encounter: Payer: Self-pay | Admitting: Family Medicine

## 2021-08-22 DIAGNOSIS — I480 Paroxysmal atrial fibrillation: Secondary | ICD-10-CM | POA: Diagnosis not present

## 2021-08-22 DIAGNOSIS — E785 Hyperlipidemia, unspecified: Secondary | ICD-10-CM | POA: Diagnosis not present

## 2021-08-22 DIAGNOSIS — R296 Repeated falls: Secondary | ICD-10-CM | POA: Diagnosis not present

## 2021-08-22 DIAGNOSIS — G2581 Restless legs syndrome: Secondary | ICD-10-CM | POA: Diagnosis not present

## 2021-08-22 DIAGNOSIS — N390 Urinary tract infection, site not specified: Secondary | ICD-10-CM | POA: Diagnosis not present

## 2021-08-22 DIAGNOSIS — I10 Essential (primary) hypertension: Secondary | ICD-10-CM | POA: Diagnosis not present

## 2021-08-22 DIAGNOSIS — Z794 Long term (current) use of insulin: Secondary | ICD-10-CM | POA: Diagnosis not present

## 2021-08-22 DIAGNOSIS — Z87891 Personal history of nicotine dependence: Secondary | ICD-10-CM | POA: Diagnosis not present

## 2021-08-22 DIAGNOSIS — K5900 Constipation, unspecified: Secondary | ICD-10-CM | POA: Diagnosis not present

## 2021-08-22 DIAGNOSIS — F32A Depression, unspecified: Secondary | ICD-10-CM | POA: Diagnosis not present

## 2021-08-22 DIAGNOSIS — E039 Hypothyroidism, unspecified: Secondary | ICD-10-CM | POA: Diagnosis not present

## 2021-08-22 DIAGNOSIS — Z8673 Personal history of transient ischemic attack (TIA), and cerebral infarction without residual deficits: Secondary | ICD-10-CM | POA: Diagnosis not present

## 2021-08-22 DIAGNOSIS — N1832 Chronic kidney disease, stage 3b: Secondary | ICD-10-CM | POA: Diagnosis not present

## 2021-08-22 DIAGNOSIS — K219 Gastro-esophageal reflux disease without esophagitis: Secondary | ICD-10-CM | POA: Diagnosis not present

## 2021-08-22 DIAGNOSIS — E1140 Type 2 diabetes mellitus with diabetic neuropathy, unspecified: Secondary | ICD-10-CM | POA: Diagnosis not present

## 2021-08-22 DIAGNOSIS — J45909 Unspecified asthma, uncomplicated: Secondary | ICD-10-CM | POA: Diagnosis not present

## 2021-08-22 DIAGNOSIS — J449 Chronic obstructive pulmonary disease, unspecified: Secondary | ICD-10-CM | POA: Diagnosis not present

## 2021-08-22 DIAGNOSIS — F22 Delusional disorders: Secondary | ICD-10-CM | POA: Diagnosis not present

## 2021-08-22 DIAGNOSIS — M6281 Muscle weakness (generalized): Secondary | ICD-10-CM | POA: Diagnosis not present

## 2021-08-22 DIAGNOSIS — Z7901 Long term (current) use of anticoagulants: Secondary | ICD-10-CM | POA: Diagnosis not present

## 2021-08-22 DIAGNOSIS — I5022 Chronic systolic (congestive) heart failure: Secondary | ICD-10-CM | POA: Diagnosis not present

## 2021-08-22 DIAGNOSIS — E1122 Type 2 diabetes mellitus with diabetic chronic kidney disease: Secondary | ICD-10-CM | POA: Diagnosis not present

## 2021-08-22 NOTE — Telephone Encounter (Signed)
[facility] would like to let [facility] know that there is a drug interaction between souoxetine and trazodone. She also wants to make [facility] aware that Patient's pain on knee was a 9 out of ten, but after applying pain cream and knee sleeve the pain went down to 2.   She would also like verbal orders.   Caller/Agency: [facility] Number: [phone] Requesting OT/PT/Skilled Nursing/Social Work/Speech Therapy: PT Frequency: 2w2, 1w2

## 2021-08-22 NOTE — Telephone Encounter (Signed)
See below on pain level.

## 2021-08-22 NOTE — Telephone Encounter (Signed)
Call physical therapy with home health back and left message on machine -there is increased risk of serotonin syndrome when taking fluoxetine and trazodone, however patient has been on these medications at stable dosage for some time so we do not expect complications at this time  Verbal orders given for PT OT I will follow-up with the family about her knee pain

## 2021-08-22 NOTE — Telephone Encounter (Signed)
OT reports that pt. States knee pain is at level 9. OT is also requesting verbal orders for below  Caller/Agency: [place]: Henrine Screws Number: [phone] Requesting OT/PT/Skilled Nursing/Social Work/Speech Therapy: OT Frequency:  1x a week for 1week 3x a week for 2weeks

## 2021-08-22 NOTE — Telephone Encounter (Signed)
See below

## 2021-08-24 ENCOUNTER — Other Ambulatory Visit: Payer: Self-pay

## 2021-08-24 ENCOUNTER — Encounter: Payer: Self-pay | Admitting: Cardiology

## 2021-08-24 ENCOUNTER — Ambulatory Visit (INDEPENDENT_AMBULATORY_CARE_PROVIDER_SITE_OTHER): Payer: Medicare Other | Admitting: Cardiology

## 2021-08-24 DIAGNOSIS — W19XXXD Unspecified fall, subsequent encounter: Secondary | ICD-10-CM

## 2021-08-24 DIAGNOSIS — I480 Paroxysmal atrial fibrillation: Secondary | ICD-10-CM | POA: Diagnosis not present

## 2021-08-24 DIAGNOSIS — I10 Essential (primary) hypertension: Secondary | ICD-10-CM | POA: Diagnosis not present

## 2021-08-24 DIAGNOSIS — N390 Urinary tract infection, site not specified: Secondary | ICD-10-CM | POA: Diagnosis not present

## 2021-08-24 DIAGNOSIS — E7800 Pure hypercholesterolemia, unspecified: Secondary | ICD-10-CM

## 2021-08-24 DIAGNOSIS — W19XXXA Unspecified fall, initial encounter: Secondary | ICD-10-CM | POA: Insufficient documentation

## 2021-08-24 DIAGNOSIS — M6281 Muscle weakness (generalized): Secondary | ICD-10-CM | POA: Diagnosis not present

## 2021-08-24 DIAGNOSIS — I428 Other cardiomyopathies: Secondary | ICD-10-CM | POA: Diagnosis not present

## 2021-08-24 DIAGNOSIS — I5022 Chronic systolic (congestive) heart failure: Secondary | ICD-10-CM | POA: Diagnosis not present

## 2021-08-24 DIAGNOSIS — E1140 Type 2 diabetes mellitus with diabetic neuropathy, unspecified: Secondary | ICD-10-CM | POA: Diagnosis not present

## 2021-08-24 NOTE — Patient Instructions (Signed)
Medication Instructions:  The current medical regimen is effective;  continue present plan and medications.  *If you need a refill on your cardiac medications before your next appointment, please call your pharmacy*  Follow-Up: At [facility], you and your health needs are our priority.  As part of our continuing mission to provide you with exceptional heart care, we have created designated Provider Care Teams.  These Care Teams include your primary Cardiologist (physician) and Advanced Practice Providers (APPs -  Physician Assistants and Nurse Practitioners) who all work together to provide you with the care you need, when you need it.  We recommend signing up for the patient portal called "MyChart".  Sign up information is provided on this After Visit Summary.  MyChart is used to connect with patients for Virtual Visits (Telemedicine).  Patients are able to view lab/test results, encounter notes, upcoming appointments, etc.  Non-urgent messages can be sent to your provider as well.   To learn more about what you can do with MyChart, go to [email or web address].    Your next appointment:   6 months   The format for your next appointment:   In Person  Provider:   Any NP or PA.  Thank you for choosing [facility]!!

## 2021-08-24 NOTE — Assessment & Plan Note (Signed)
Currently in sinus rhythm.  Excellent post cardioversion in August.  Stable.  Continue with Eliquis.

## 2021-08-24 NOTE — Assessment & Plan Note (Signed)
EF 20 to 25% on echocardiogram.  Hopefully this was improved after atrial fibrillation was cardioverted.  Continue with metoprolol.  Renal function does not allow for ARN I.

## 2021-08-24 NOTE — Assessment & Plan Note (Signed)
Has had a couple encounters, couple falls.  Continue to be careful.  Continue with Eliquis for now.  No head injury.

## 2021-08-24 NOTE — Assessment & Plan Note (Signed)
Continue with atorvastatin 40 mg.  Excellent.
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
## 2021-08-27 DIAGNOSIS — I5022 Chronic systolic (congestive) heart failure: Secondary | ICD-10-CM | POA: Diagnosis not present

## 2021-08-27 DIAGNOSIS — I10 Essential (primary) hypertension: Secondary | ICD-10-CM | POA: Diagnosis not present

## 2021-08-27 DIAGNOSIS — N390 Urinary tract infection, site not specified: Secondary | ICD-10-CM | POA: Diagnosis not present

## 2021-08-27 DIAGNOSIS — I480 Paroxysmal atrial fibrillation: Secondary | ICD-10-CM | POA: Diagnosis not present

## 2021-08-27 DIAGNOSIS — M6281 Muscle weakness (generalized): Secondary | ICD-10-CM | POA: Diagnosis not present

## 2021-08-27 DIAGNOSIS — E1140 Type 2 diabetes mellitus with diabetic neuropathy, unspecified: Secondary | ICD-10-CM | POA: Diagnosis not present

## 2021-08-29 ENCOUNTER — Telehealth: Payer: Self-pay | Admitting: *Deleted

## 2021-08-29 ENCOUNTER — Telehealth: Payer: Self-pay | Admitting: Family Medicine

## 2021-08-29 DIAGNOSIS — I480 Paroxysmal atrial fibrillation: Secondary | ICD-10-CM | POA: Diagnosis not present

## 2021-08-29 DIAGNOSIS — E1140 Type 2 diabetes mellitus with diabetic neuropathy, unspecified: Secondary | ICD-10-CM | POA: Diagnosis not present

## 2021-08-29 DIAGNOSIS — I10 Essential (primary) hypertension: Secondary | ICD-10-CM | POA: Diagnosis not present

## 2021-08-29 DIAGNOSIS — M6281 Muscle weakness (generalized): Secondary | ICD-10-CM | POA: Diagnosis not present

## 2021-08-29 DIAGNOSIS — I5022 Chronic systolic (congestive) heart failure: Secondary | ICD-10-CM | POA: Diagnosis not present

## 2021-08-29 DIAGNOSIS — N390 Urinary tract infection, site not specified: Secondary | ICD-10-CM | POA: Diagnosis not present
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
## 2021-08-29 NOTE — Telephone Encounter (Signed)
Caller states she was transferred from the office and she is calling from [facility] pt was recently discharged for uti she was on antibiotic and now they are coming bk. Caller states pt said she pees a little bit, and it burns when she pee.

## 2021-08-29 NOTE — Telephone Encounter (Signed)
[facility]  Pt is continuing to have pain within the bladder region. Pt is not urinating properly, she stated that it burn when using the restroom. Transferred pt to Triage. Tele: [phone].

## 2021-08-29 NOTE — Telephone Encounter (Signed)
Occupational nurse Bevely Palmer called about patient and stated that patient has been having burning when she urinates, minimal output (still feeling like she has the urgency to go), and pain that comes up to lower abdomen.  Since she was there with patient I advised her to go to ER but patient wanted appt for tomorrow.  Appt scheduled for tomorrow.

## 2021-08-29 NOTE — Telephone Encounter (Signed)
Pt scheduled for appt tomorrow.  

## 2021-08-30 ENCOUNTER — Ambulatory Visit (INDEPENDENT_AMBULATORY_CARE_PROVIDER_SITE_OTHER): Payer: Medicare Other | Admitting: Family Medicine

## 2021-08-30 ENCOUNTER — Other Ambulatory Visit: Payer: Self-pay

## 2021-08-30 ENCOUNTER — Encounter: Payer: Self-pay | Admitting: Family Medicine

## 2021-08-30 VITALS — BP 122/70 | HR 91 | Resp 18 | Ht 64.0 in | Wt 160.6 lb

## 2021-08-30 DIAGNOSIS — E538 Deficiency of other specified B group vitamins: Secondary | ICD-10-CM

## 2021-08-30 DIAGNOSIS — I480 Paroxysmal atrial fibrillation: Secondary | ICD-10-CM | POA: Diagnosis not present

## 2021-08-30 DIAGNOSIS — E1140 Type 2 diabetes mellitus with diabetic neuropathy, unspecified: Secondary | ICD-10-CM | POA: Diagnosis not present

## 2021-08-30 DIAGNOSIS — R300 Dysuria: Secondary | ICD-10-CM

## 2021-08-30 DIAGNOSIS — Z23 Encounter for immunization: Secondary | ICD-10-CM | POA: Diagnosis not present

## 2021-08-30 DIAGNOSIS — I10 Essential (primary) hypertension: Secondary | ICD-10-CM | POA: Diagnosis not present

## 2021-08-30 DIAGNOSIS — N390 Urinary tract infection, site not specified: Secondary | ICD-10-CM | POA: Diagnosis not present

## 2021-08-30 DIAGNOSIS — M6281 Muscle weakness (generalized): Secondary | ICD-10-CM | POA: Diagnosis not present

## 2021-08-30 DIAGNOSIS — I5022 Chronic systolic (congestive) heart failure: Secondary | ICD-10-CM | POA: Diagnosis not present

## 2021-08-30 LAB — POCT URINALYSIS DIPSTICK
Bilirubin, UA: NEGATIVE
Blood, UA: NEGATIVE
Glucose, UA: NEGATIVE
Ketones, UA: NEGATIVE
Nitrite, UA: NEGATIVE
Protein, UA: POSITIVE — AB
Spec Grav, UA: 1.02 (ref 1.010–1.025)
Urobilinogen, UA: 0.2 E.U./dL
pH, UA: 6 (ref 5.0–8.0)

## 2021-08-30 MED ORDER — SULFAMETHOXAZOLE-TRIMETHOPRIM 400-80 MG PO TABS: 1.0000 | ORAL_TABLET | Freq: Two times a day (BID) | ORAL | 0 refills | Status: DC

## 2021-08-30 MED ORDER — CYANOCOBALAMIN 1000 MCG/ML IJ SOLN
1000.0000 ug | Freq: Once | INTRAMUSCULAR | Status: AC
Start: 2021-08-30 — End: 2021-08-30
  Administered 2021-08-30: 16:00:00 1000 ug via INTRAMUSCULAR

## 2021-08-30 NOTE — Patient Instructions (Signed)
Good to see you today! I will be in touch with your urine culture asap Flu shot, B12 shot given today We can do a B12 shot for you monthly- I will place a standing order for your   We will treat presumed UTI today with septra, take twice a day for 5- 7 days (depending on culture results) Please let me know if any worsening or other concerns!

## 2021-08-31 LAB — URINE CULTURE
MICRO NUMBER:: 12651649
Result:: NO GROWTH
SPECIMEN QUALITY:: ADEQUATE

## 2021-09-01 ENCOUNTER — Encounter: Payer: Self-pay | Admitting: Family Medicine

## 2021-09-03 DIAGNOSIS — M6281 Muscle weakness (generalized): Secondary | ICD-10-CM | POA: Diagnosis not present

## 2021-09-03 DIAGNOSIS — I480 Paroxysmal atrial fibrillation: Secondary | ICD-10-CM | POA: Diagnosis not present

## 2021-09-03 DIAGNOSIS — E1140 Type 2 diabetes mellitus with diabetic neuropathy, unspecified: Secondary | ICD-10-CM | POA: Diagnosis not present

## 2021-09-03 DIAGNOSIS — I10 Essential (primary) hypertension: Secondary | ICD-10-CM | POA: Diagnosis not present

## 2021-09-03 DIAGNOSIS — I5022 Chronic systolic (congestive) heart failure: Secondary | ICD-10-CM | POA: Diagnosis not present

## 2021-09-03 DIAGNOSIS — N390 Urinary tract infection, site not specified: Secondary | ICD-10-CM | POA: Diagnosis not present

## 2021-09-05 ENCOUNTER — Inpatient Hospital Stay: Payer: BLUE CROSS/BLUE SHIELD | Admitting: Family Medicine

## 2021-09-05 DIAGNOSIS — E1140 Type 2 diabetes mellitus with diabetic neuropathy, unspecified: Secondary | ICD-10-CM | POA: Diagnosis not present

## 2021-09-05 DIAGNOSIS — I10 Essential (primary) hypertension: Secondary | ICD-10-CM | POA: Diagnosis not present

## 2021-09-05 DIAGNOSIS — I480 Paroxysmal atrial fibrillation: Secondary | ICD-10-CM | POA: Diagnosis not present

## 2021-09-05 DIAGNOSIS — I5022 Chronic systolic (congestive) heart failure: Secondary | ICD-10-CM | POA: Diagnosis not present

## 2021-09-05 DIAGNOSIS — N390 Urinary tract infection, site not specified: Secondary | ICD-10-CM | POA: Diagnosis not present

## 2021-09-05 DIAGNOSIS — M6281 Muscle weakness (generalized): Secondary | ICD-10-CM | POA: Diagnosis not present

## 2021-09-07 DIAGNOSIS — E1140 Type 2 diabetes mellitus with diabetic neuropathy, unspecified: Secondary | ICD-10-CM | POA: Diagnosis not present

## 2021-09-07 DIAGNOSIS — M6281 Muscle weakness (generalized): Secondary | ICD-10-CM | POA: Diagnosis not present

## 2021-09-07 DIAGNOSIS — N390 Urinary tract infection, site not specified: Secondary | ICD-10-CM | POA: Diagnosis not present

## 2021-09-07 DIAGNOSIS — I5022 Chronic systolic (congestive) heart failure: Secondary | ICD-10-CM | POA: Diagnosis not present

## 2021-09-07 DIAGNOSIS — I480 Paroxysmal atrial fibrillation: Secondary | ICD-10-CM | POA: Diagnosis not present

## 2021-09-07 DIAGNOSIS — I10 Essential (primary) hypertension: Secondary | ICD-10-CM | POA: Diagnosis not present

## 2021-09-10 ENCOUNTER — Other Ambulatory Visit: Payer: Self-pay | Admitting: Endocrinology

## 2021-09-10 ENCOUNTER — Other Ambulatory Visit: Payer: Self-pay | Admitting: Adult Health

## 2021-09-10 DIAGNOSIS — I10 Essential (primary) hypertension: Secondary | ICD-10-CM | POA: Diagnosis not present

## 2021-09-10 DIAGNOSIS — I5022 Chronic systolic (congestive) heart failure: Secondary | ICD-10-CM | POA: Diagnosis not present

## 2021-09-10 DIAGNOSIS — N390 Urinary tract infection, site not specified: Secondary | ICD-10-CM | POA: Diagnosis not present

## 2021-09-10 DIAGNOSIS — I480 Paroxysmal atrial fibrillation: Secondary | ICD-10-CM | POA: Diagnosis not present

## 2021-09-10 DIAGNOSIS — E1140 Type 2 diabetes mellitus with diabetic neuropathy, unspecified: Secondary | ICD-10-CM | POA: Diagnosis not present

## 2021-09-10 DIAGNOSIS — M6281 Muscle weakness (generalized): Secondary | ICD-10-CM | POA: Diagnosis not present

## 2021-09-12 ENCOUNTER — Other Ambulatory Visit: Payer: Self-pay | Admitting: Family Medicine

## 2021-09-17 DIAGNOSIS — I5022 Chronic systolic (congestive) heart failure: Secondary | ICD-10-CM | POA: Diagnosis not present

## 2021-09-17 DIAGNOSIS — I480 Paroxysmal atrial fibrillation: Secondary | ICD-10-CM | POA: Diagnosis not present

## 2021-09-17 DIAGNOSIS — N390 Urinary tract infection, site not specified: Secondary | ICD-10-CM | POA: Diagnosis not present

## 2021-09-17 DIAGNOSIS — I10 Essential (primary) hypertension: Secondary | ICD-10-CM | POA: Diagnosis not present

## 2021-09-17 DIAGNOSIS — E1140 Type 2 diabetes mellitus with diabetic neuropathy, unspecified: Secondary | ICD-10-CM | POA: Diagnosis not present

## 2021-09-17 DIAGNOSIS — M6281 Muscle weakness (generalized): Secondary | ICD-10-CM | POA: Diagnosis not present

## 2021-09-25 DIAGNOSIS — Z23 Encounter for immunization: Secondary | ICD-10-CM | POA: Diagnosis not present

## 2021-10-05 ENCOUNTER — Other Ambulatory Visit: Payer: Self-pay | Admitting: Family Medicine

## 2021-10-05 ENCOUNTER — Other Ambulatory Visit: Payer: Self-pay

## 2021-10-05 ENCOUNTER — Encounter: Payer: Self-pay | Admitting: Family Medicine

## 2021-10-05 DIAGNOSIS — G629 Polyneuropathy, unspecified: Secondary | ICD-10-CM

## 2021-10-05 MED ORDER — PREGABALIN 50 MG PO CAPS: 100.0000 mg | ORAL_CAPSULE | Freq: Every day | ORAL | 1 refills | Status: DC

## 2021-10-05 MED ORDER — PREGABALIN 50 MG PO CAPS
100.0000 mg | ORAL_CAPSULE | Freq: Every day | ORAL | 0 refills | Status: DC
Start: 2021-10-05 — End: 2021-10-05

## 2021-10-05 MED ORDER — METOPROLOL TARTRATE 50 MG PO TABS: 75.0000 mg | ORAL_TABLET | Freq: Two times a day (BID) | ORAL | 2 refills | Status: DC

## 2021-10-05 NOTE — Telephone Encounter (Signed)
Rx(s) sent to pharmacy electronically.  

## 2021-10-06 MED ORDER — OZEMPIC (0.25 OR 0.5 MG/DOSE) 2 MG/1.5ML ~~LOC~~ SOPN: 0.5000 mg | PEN_INJECTOR | SUBCUTANEOUS | 3 refills | Status: DC

## 2021-10-06 NOTE — Addendum Note (Signed)
Addended by: Lamar Blinks C on: 10/06/2021 10:27 PM   Modules accepted: Orders

## 2021-10-10 ENCOUNTER — Other Ambulatory Visit: Payer: Self-pay | Admitting: *Deleted

## 2021-10-10 MED ORDER — QUETIAPINE FUMARATE 50 MG PO TABS: 100.0000 mg | ORAL_TABLET | Freq: Every day | ORAL | 0 refills | Status: DC

## 2021-10-10 NOTE — Telephone Encounter (Signed)
Received 90 day prescription request from [facility] for Quetiapine 50 mg. Per chart, Dr Jannifer [name] (now retired) had patient taking 100 mg at bedtime using 50 mg tablets. Will send Rx request to Dr April Manson as Dr Brett Fairy will be taking over care but has not seen the patient and she is out of the office. Called [facility] in [place] and canceled any active refills on file since pt is going to get this through mail order.

## 2021-10-11 ENCOUNTER — Other Ambulatory Visit: Payer: Self-pay

## 2021-10-11 MED ORDER — VITAMIN B-12 1000 MCG PO TABS: 1000.0000 ug | ORAL_TABLET | Freq: Every day | ORAL | 1 refills | Status: DC

## 2021-10-17 ENCOUNTER — Encounter: Payer: Self-pay | Admitting: Neurology

## 2021-10-17 ENCOUNTER — Ambulatory Visit (INDEPENDENT_AMBULATORY_CARE_PROVIDER_SITE_OTHER): Payer: Medicare Other | Admitting: Neurology

## 2021-10-17 VITALS — BP 136/83 | HR 95 | Ht 64.0 in | Wt 160.0 lb

## 2021-10-17 DIAGNOSIS — F02A11 Dementia in other diseases classified elsewhere, mild, with agitation: Secondary | ICD-10-CM

## 2021-10-17 DIAGNOSIS — R251 Tremor, unspecified: Secondary | ICD-10-CM | POA: Insufficient documentation

## 2021-10-17 DIAGNOSIS — R441 Visual hallucinations: Secondary | ICD-10-CM | POA: Diagnosis not present

## 2021-10-17 DIAGNOSIS — F22 Delusional disorders: Secondary | ICD-10-CM

## 2021-10-17 DIAGNOSIS — G3183 Dementia with Lewy bodies: Secondary | ICD-10-CM | POA: Diagnosis not present

## 2021-10-17 MED ORDER — FLUOXETINE HCL 20 MG PO CAPS: 20.0000 mg | ORAL_CAPSULE | Freq: Every day | ORAL | 3 refills | Status: AC

## 2021-10-17 MED ORDER — PRAMIPEXOLE DIHYDROCHLORIDE 0.25 MG PO TABS: 0.2500 mg | ORAL_TABLET | Freq: Every day | ORAL | 0 refills | Status: DC

## 2021-10-17 MED ORDER — TRAZODONE HCL 100 MG PO TABS: 100.0000 mg | ORAL_TABLET | Freq: Every day | ORAL | 3 refills | Status: AC

## 2021-10-17 MED ORDER — QUETIAPINE FUMARATE 50 MG PO TABS: 100.0000 mg | ORAL_TABLET | Freq: Every day | ORAL | 0 refills | Status: DC

## 2021-10-17 NOTE — Patient Instructions (Signed)
Lewy Body Disease Lewy body dementia, also called dementia with Lewy bodies, is a condition that affects the way the brain functions. It is one form of dementia. In this condition, proteins called Lewy bodies build up in certain areas of the brain. This causes problems with: Memory. Decision making. Behavior. Speaking. Thinking and problem solving. Movements and balance. This condition is progressive, which means that it gets worse with time (is degenerative) and cannot be reversed. What are the causes? This condition is caused by the buildup of Lewy bodies in brain cells in areas of the brain that control memory, thinking, and movement. It is not known what causes the Lewy bodies to build up. What increases the risk? You are more likely to develop this condition if you: Have a family history of Lewy body dementia or Parkinson's disease. Are 38 years old or older. Are female. What are the signs or symptoms? Symptoms of this condition may include: Seeing things that are not there (hallucinating). Sleep problems, such as acting out dreams while you are asleep. Changes in memory, attention, and concentration that come and go (fluctuate). Symptoms of dementia, such as: Trouble with memory. Trouble paying attention. Problems with planning and organizing. Problems with judgment. Behavioral problems. Symptoms of Parkinson's disease, such as: Shaking movements that you cannot control (tremor). Tremors usually start in a hand or foot when you are resting (resting tremor). Stooped posture. Slowing of movement. Stiff muscles (rigidity). Loss of balance and stability when standing. How is this diagnosed? This condition is diagnosed by a specialist who diagnoses and treats this condition (neurologist). Your health care provider will talk with you and your family, friends, or caregivers about your history and symptoms. A thorough medical history will be taken, and you will have a physical exam and  tests. Tests may include: Lab tests, such as blood or urine tests. Imaging tests, such as a CT scan, a PET scan, or an MRI. A test that involves removing and testing a small amount of the fluid that surrounds the brain and spinal cord (lumbar puncture). Tests that evaluate brain function, such as memory tests, cognitive tests, and neuropsychological tests. How is this treated? There is no cure for this condition. Treatment focuses on managing your symptoms. Treatment may include: Medicines to help with hallucinations, sleep, and behavioral problems. Speech, occupational, and physical therapy. Your health care provider can help direct you to support groups, organizations, and other health care providers who can help with decisions about your care. Follow these instructions at home: Medicines Take over-the-counter and prescription medicines only as told by your health care provider. To help you manage your medicines, use a pill organizer or pill reminder. Avoid taking medicines that can affect thinking, such as pain medicines or certain sleeping medicines. Always check with your health care provider before taking any new medicines. Lifestyle Make healthy lifestyle choices: Be physically active as told by your health care provider. Do not use any products that contain nicotine or tobacco, such as cigarettes, e-cigarettes, and chewing tobacco. If you need help quitting, ask your health care provider. Try to practice stress-management techniques when you experience stress, such as mindfulness, yoga, or deep breathing. Stay socially connected. Talk regularly with other people, such as family, friends, and neighbors. Make sure you sleep well. These tips can help you get a good night's rest: Avoid napping during the day. Keep your sleeping area dark and cool. Avoid exercising a few hours before you go to bed. Avoid caffeine products in the  evening. Eating and drinking Do not drink alcohol. Drink  enough fluid to keep your urine pale yellow. Eat a healthy diet. Safety  Work with your health care provider to determine what you need help with and what your safety needs are. If you have trouble moving around, use a cane or walker as told by your health care provider. Make sure your home environment is safe. To do this: Remove things that can be a tripping hazard, such as throw rugs or clutter. Install grab bars and railings in your home to prevent falls. Talk with your health care provider about whether it is safe for you to drive. If you were given a bracelet that identifies you as a person with memory loss or tracks your location, make sure to wear it at all times. General instructions  Work with your family to make important decisions, such as advance directives, medical power of attorney, or a living will. Keep all follow-up visits. This is important. Where to find more information [employer] of Neurological Disorders and Stroke: [employer]Box[employer].it Contact a health care provider if you have: New or worsening confusion. New or worsening trouble with sleeping or increased daytime sleepiness. Problems with choking or swallowing. Get help right away if: You feel depressed or sad, or feel that you want to harm yourself. Your family members become concerned for your safety. If you ever feel like you may hurt yourself or others, or have thoughts about taking your own life, get help right away. Go to your nearest emergency department or: Call your local emergency services (911 in the [place] Call a suicide crisis helpline, such as the [facility] at (223)273-2800 or 988 in the [place]. This is open 24 hours a day in the [place] Text the Crisis Text Line at (224)114-4223 (in the [place].). Summary Lewy body dementia is a condition that affects the way the brain functions. It causes problems with functions such as memory and thinking. Lewy body dementia gets worse with  time. This condition is caused by the buildup of proteins called Lewy bodies in brain cells. It is not known what causes the Lewy bodies to build up. Work with your health care provider to determine what you need help with and what your safety needs are. Your health care provider can help direct you to support groups, organizations, and other health care providers who can help with decisions about your care. This information is not intended to replace advice given to you by your health care provider. Make sure you discuss any questions you have with your health care provider. Document Revised: 04/25/2021 Document Reviewed: 02/14/2020 Elsevier Patient Education  [place].
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
## 2021-11-01 ENCOUNTER — Other Ambulatory Visit: Payer: Self-pay | Admitting: Physician Assistant

## 2021-11-12 ENCOUNTER — Other Ambulatory Visit: Payer: Self-pay | Admitting: *Deleted

## 2021-11-12 MED ORDER — METOPROLOL TARTRATE 50 MG PO TABS: ORAL_TABLET | ORAL | 3 refills | Status: DC

## 2021-11-16 ENCOUNTER — Ambulatory Visit: Payer: Medicare Other | Admitting: Cardiology

## 2021-11-21 ENCOUNTER — Encounter: Payer: Self-pay | Admitting: Family Medicine

## 2021-11-21 MED ORDER — VITAMIN B-12 1000 MCG PO TABS: 1000.0000 ug | ORAL_TABLET | Freq: Every day | ORAL | 1 refills | Status: DC

## 2021-11-28 IMAGING — CT CT ABD-PELV W/O CM
2 of 4 series · 16 of 46 positions shown, 18 images · non-contrast
Comparison: CT 05/02/2020

CLINICAL DATA: Diverticulitis suspected

Slid out of chair to floor.  Weakness.
EXAM:
CT ABDOMEN AND PELVIS WITHOUT CONTRAST
TECHNIQUE: Multidetector CT imaging of the abdomen and pelvis was performed
following the standard protocol without IV contrast.

[Series 2: axial st · axial · 0.85mm/px · z∈[+138,+558]mm · 13 of 92 slices shown, 15 images]
[im 4/92  soft-tissue]
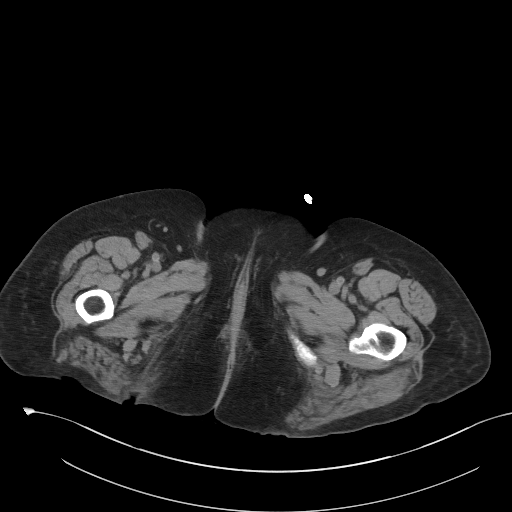
[im 4/92  bone]
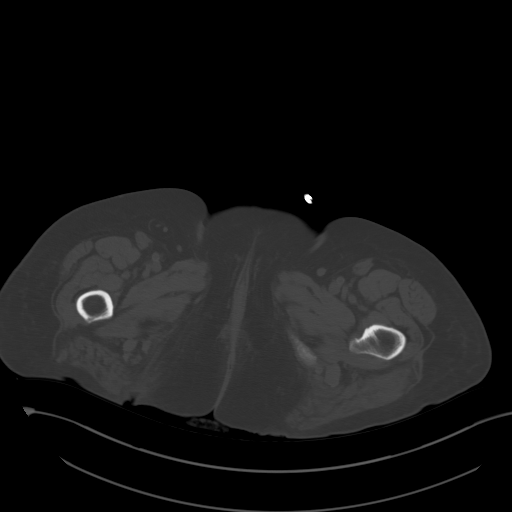
[im 12/92  soft-tissue]
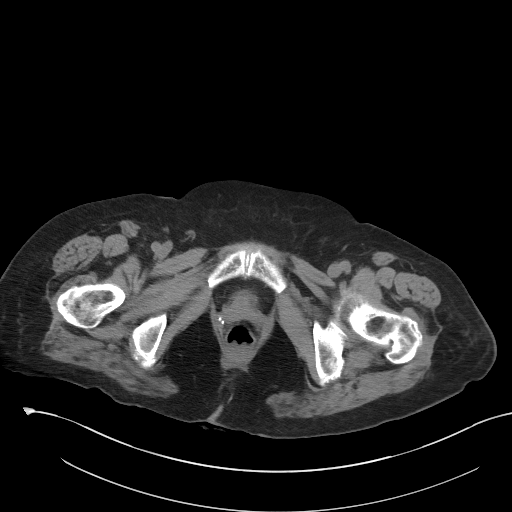
[im 19/92  soft-tissue]
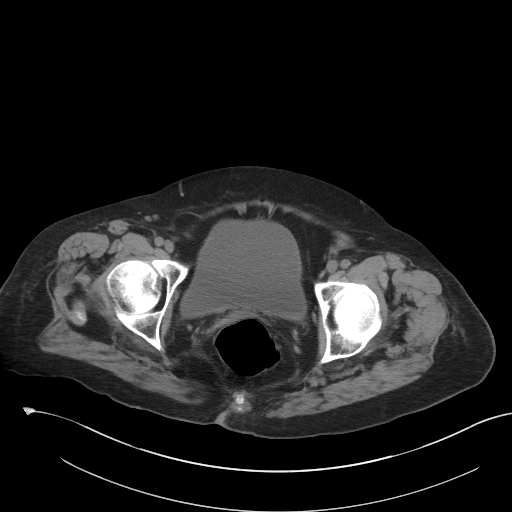
[im 27/92  soft-tissue]
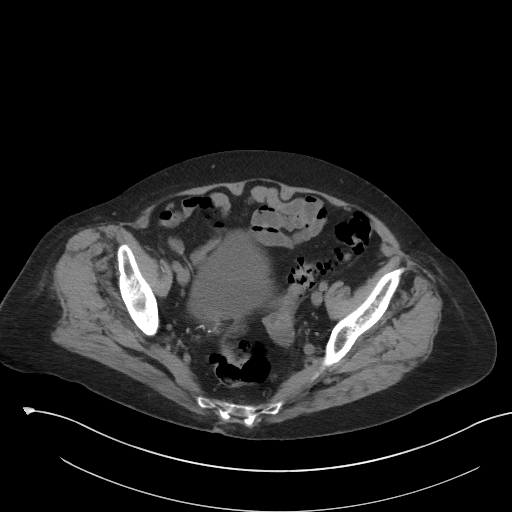
[im 31/92  soft-tissue]
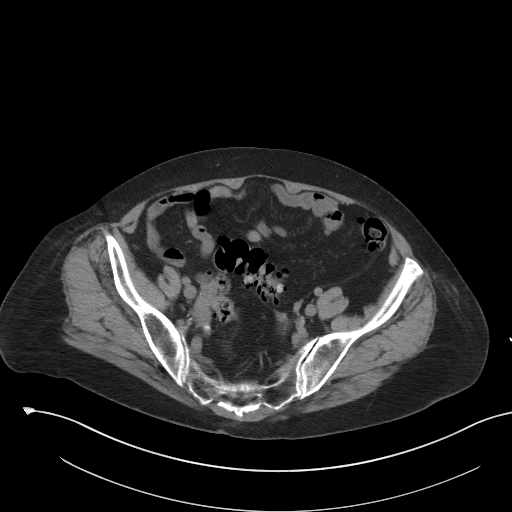
[im 38/92  soft-tissue]
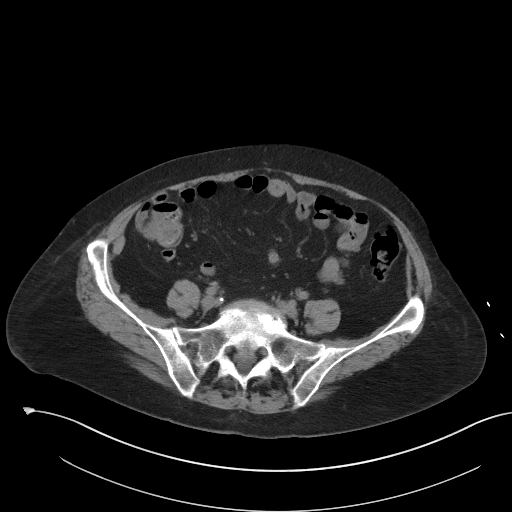
[im 46/92  soft-tissue]
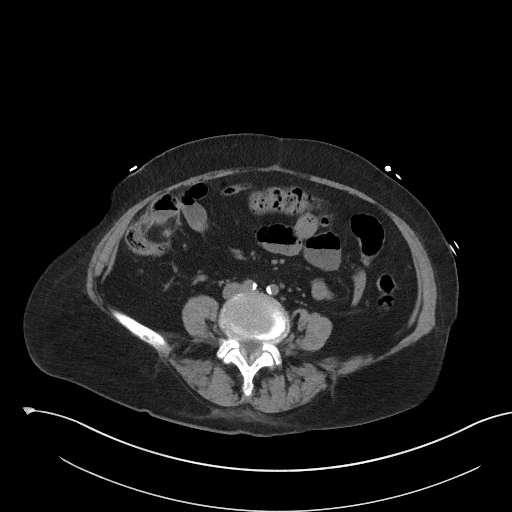
[im 54/92  soft-tissue]
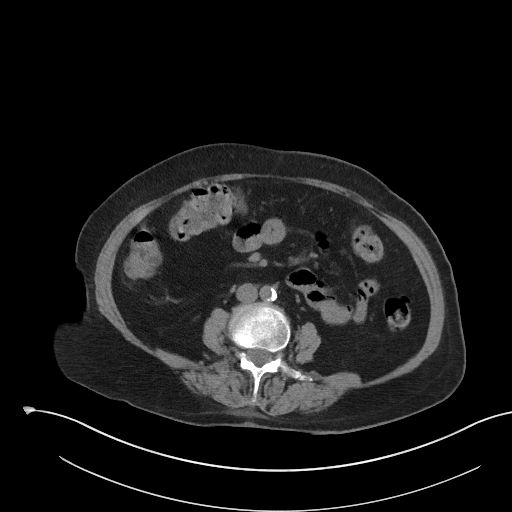
[im 61/92  soft-tissue]
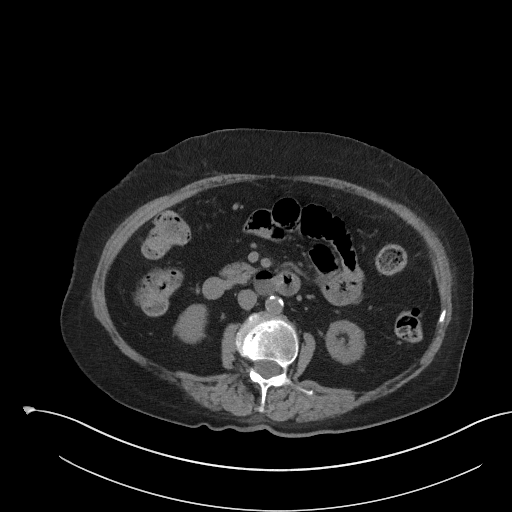
[im 61/92  bone]
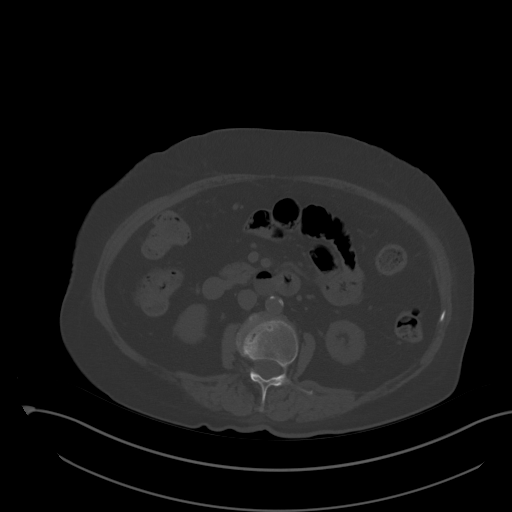
[im 65/92  soft-tissue]
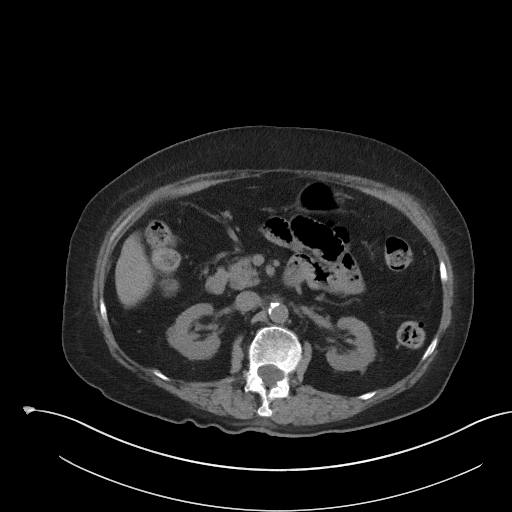
[im 73/92  soft-tissue]
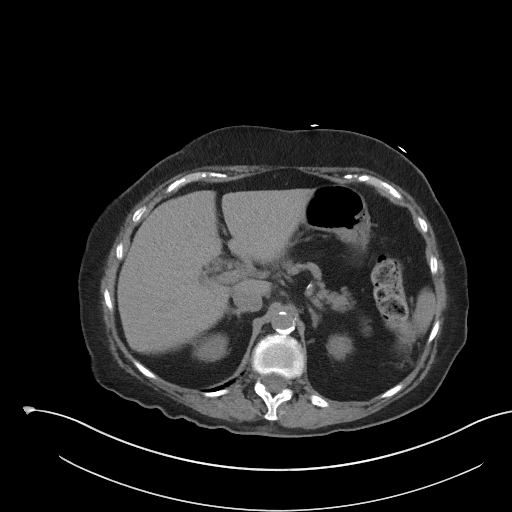
[im 80/92  soft-tissue]
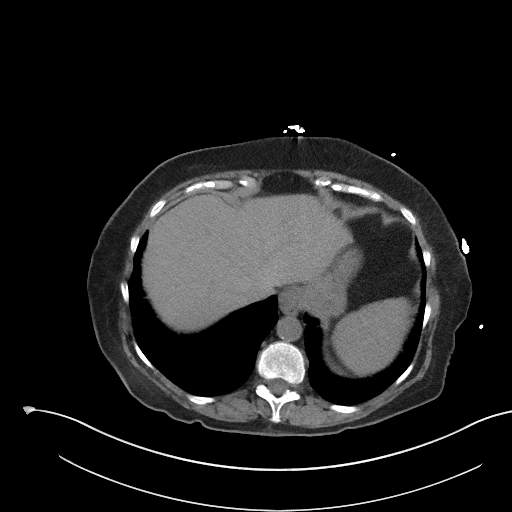
[im 88/92  soft-tissue]
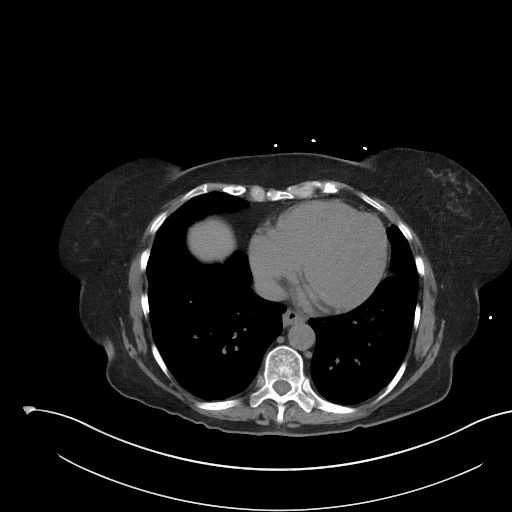

[Series 5: coronal st · coronal · 0.88mm/px · 3 of 93 slices shown]
[im 31/93  soft-tissue]
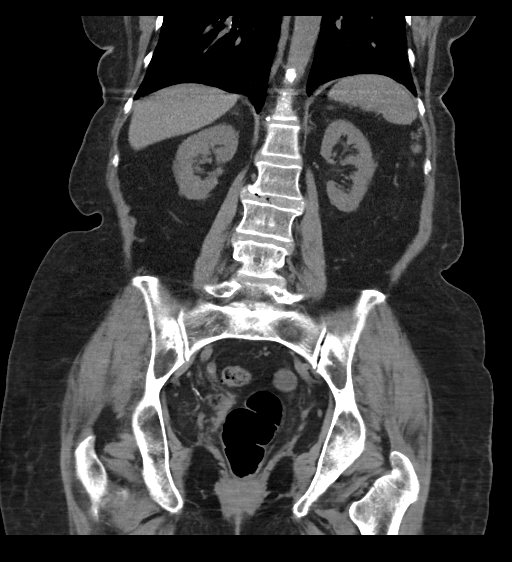
[im 41/93  soft-tissue]
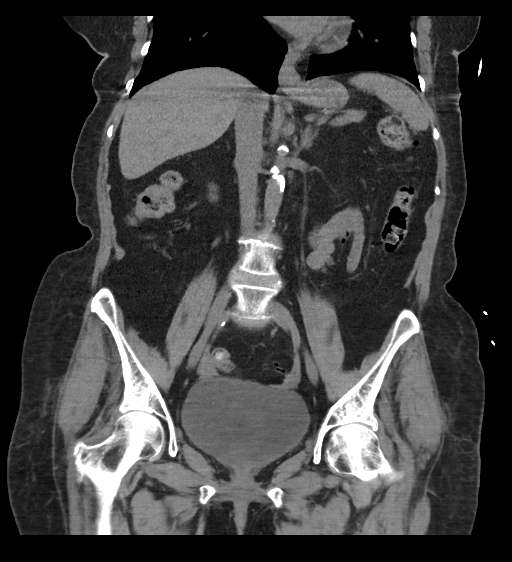
[im 52/93  soft-tissue]
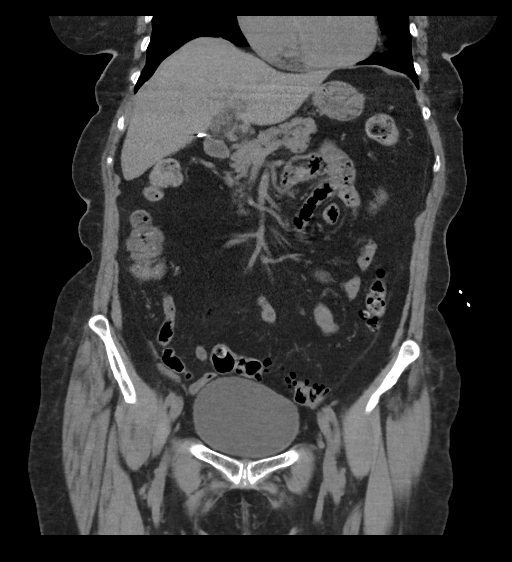

[16 of 46 positions shown; findings below may reference images not displayed]

FINDINGS: Lower chest: Stable 4 mm left lower lobe pulmonary nodule, series 6,
image 4, considered benign. No acute airspace disease or pleural
effusion.

Hepatobiliary: No focal liver abnormality on this noncontrast exam.
Punctate calcified granuloma in the right lobe. Clips in the
gallbladder fossa postcholecystectomy. No biliary dilatation.

Pancreas: Unremarkable. No pancreatic ductal dilatation or
surrounding inflammatory changes.

Spleen: Normal in size without focal abnormality.

Adrenals/Urinary Tract: Normal adrenal glands. No hydronephrosis.
Bilateral renal parenchymal thinning and renal atrophy. Slight
perinephric edema appears symmetric and chronic. No renal calculi.
Decompressed ureters. Unremarkable urinary bladder.

Stomach/Bowel: Small hiatal hernia. Decompressed stomach. No small
bowel obstruction or inflammation. Appendectomy per history. Left
colonic diverticulosis without diverticulitis or acute pericolonic
edema. No evidence of colonic wall thickening.

Vascular/Lymphatic: Aortic atherosclerosis. No aneurysm. No
abdominopelvic adenopathy.

Reproductive: Hysterectomy. 2 cm left adnexal cyst is not
significantly changed from prior exam. This is stable dating back to
3676 exam, and likely benign. No suspicious adnexal mass.

Other: No free air, free fluid, or intra-abdominal fluid collection.

Musculoskeletal: Multilevel degenerative change in the lumbar spine
with primarily facet hypertrophy. There are no acute or suspicious
osseous abnormalities.
IMPRESSION: 1. Colonic diverticulosis without acute diverticulitis. No acute
findings in the abdomen/pelvis.
2. Small hiatal hernia.

Aortic Atherosclerosis (6CO2A-N2M.M).

## 2021-11-28 IMAGING — CR DG CHEST 2V
1 series · 1 of 1 positions shown · non-contrast
Comparison: Radiograph 12/07/2020.

CLINICAL DATA: Shortness of breath.  Weakness.

EXAM:
CHEST - 2 VIEW

[w chest lat]
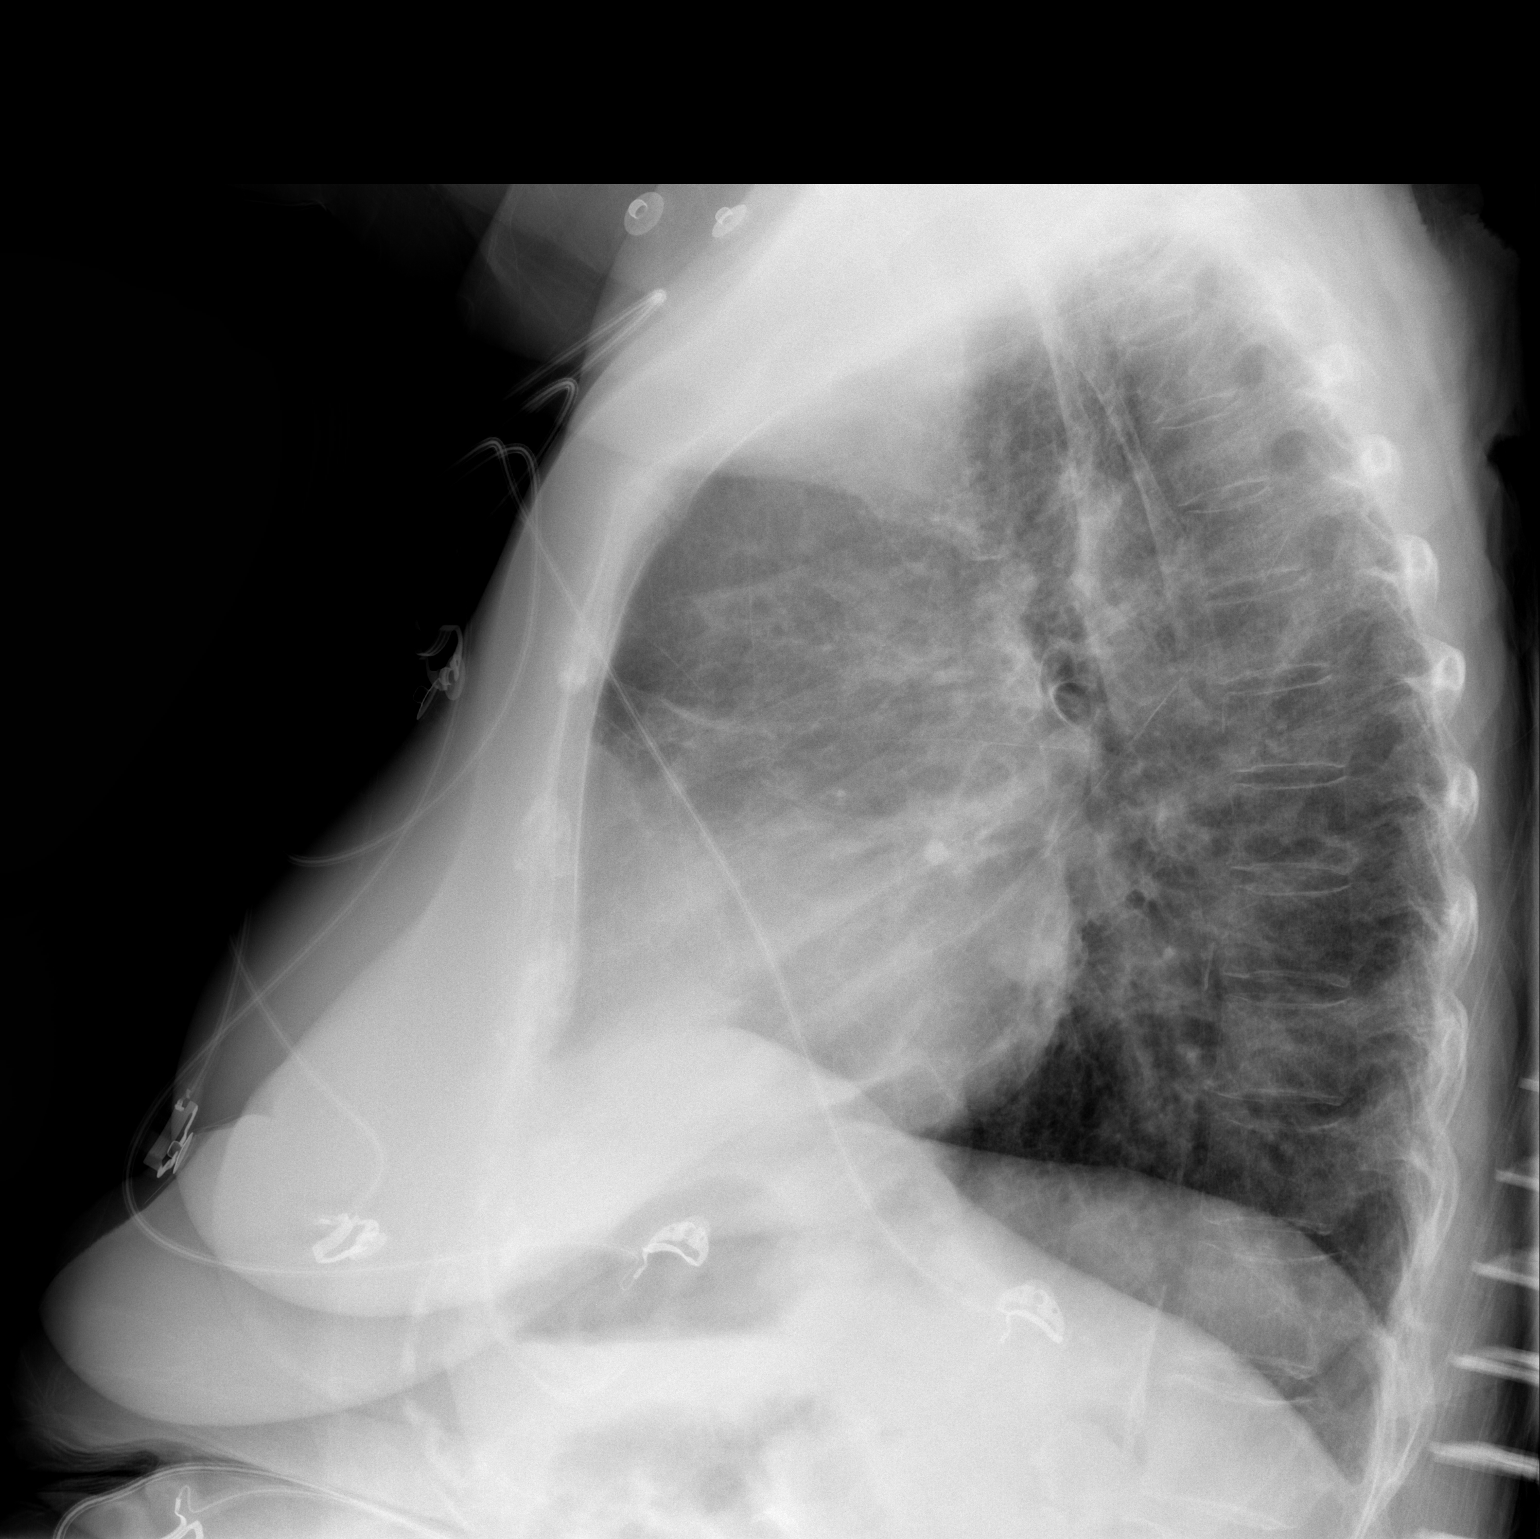

[1 of 1 positions shown; findings below may reference images not displayed]

FINDINGS: The cardiomediastinal contours are normal. Aortic atherosclerosis.
Pulmonary vasculature is normal. No consolidation, pleural effusion,
or pneumothorax. No acute osseous abnormalities are seen. Surgical
hardware in the right proximal humerus.
IMPRESSION: No acute abnormality.

## 2021-12-01 ENCOUNTER — Encounter: Payer: Self-pay | Admitting: Family Medicine
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
## 2021-12-06 ENCOUNTER — Other Ambulatory Visit (HOSPITAL_BASED_OUTPATIENT_CLINIC_OR_DEPARTMENT_OTHER): Payer: Self-pay

## 2021-12-06 ENCOUNTER — Encounter: Payer: Self-pay | Admitting: Family Medicine

## 2021-12-06 ENCOUNTER — Ambulatory Visit (INDEPENDENT_AMBULATORY_CARE_PROVIDER_SITE_OTHER): Payer: Medicare Other | Admitting: Family Medicine

## 2021-12-06 VITALS — BP 122/64 | HR 84 | Temp 98.5°F | Resp 18 | Wt 156.8 lb

## 2021-12-06 DIAGNOSIS — N289 Disorder of kidney and ureter, unspecified: Secondary | ICD-10-CM | POA: Diagnosis not present

## 2021-12-06 DIAGNOSIS — R300 Dysuria: Secondary | ICD-10-CM

## 2021-12-06 DIAGNOSIS — E538 Deficiency of other specified B group vitamins: Secondary | ICD-10-CM | POA: Diagnosis not present

## 2021-12-06 DIAGNOSIS — I480 Paroxysmal atrial fibrillation: Secondary | ICD-10-CM | POA: Diagnosis not present

## 2021-12-06 LAB — COMPREHENSIVE METABOLIC PANEL
ALT: 12 U/L (ref 0–35)
AST: 14 U/L (ref 0–37)
Albumin: 3.9 g/dL (ref 3.5–5.2)
Alkaline Phosphatase: 77 U/L (ref 39–117)
BUN: 25 mg/dL — ABNORMAL HIGH (ref 6–23)
CO2: 36 mEq/L — ABNORMAL HIGH (ref 19–32)
Calcium: 8.7 mg/dL (ref 8.4–10.5)
Chloride: 98 mEq/L (ref 96–112)
Creatinine, Ser: 1.67 mg/dL — ABNORMAL HIGH (ref 0.40–1.20)
GFR: 26.58 mL/min — ABNORMAL LOW (ref >60.00)
Glucose, Bld: 173 mg/dL — ABNORMAL HIGH (ref 70–99)
Potassium: 4.2 mEq/L (ref 3.5–5.1)
Sodium: 140 mEq/L (ref 135–145)
Total Bilirubin: 0.4 mg/dL (ref 0.2–1.2)
Total Protein: 6.9 g/dL (ref 6.0–8.3)

## 2021-12-06 LAB — CBC
HCT: 39.4 % (ref 36.0–46.0)
Hemoglobin: 13 g/dL (ref 12.0–15.0)
MCHC: 32.9 g/dL (ref 30.0–36.0)
MCV: 86.3 fl (ref 78.0–100.0)
Platelets: 202 10*3/uL (ref 150.0–400.0)
RBC: 4.56 Mil/uL (ref 3.87–5.11)
RDW: 13.2 % (ref 11.5–15.5)
WBC: 9.6 10*3/uL (ref 4.0–10.5)

## 2021-12-06 LAB — POCT URINALYSIS DIP (CLINITEK)
Bilirubin, UA: NEGATIVE
Blood, UA: NEGATIVE
Glucose, UA: NEGATIVE mg/dL
Ketones, POC UA: NEGATIVE mg/dL
Nitrite, UA: NEGATIVE
Spec Grav, UA: 1.02 (ref 1.010–1.025)
Urobilinogen, UA: 0.2 E.U./dL
pH, UA: 6 (ref 5.0–8.0)

## 2021-12-06 LAB — TSH: TSH: 4.14 u[IU]/mL (ref 0.35–5.50)

## 2021-12-06 LAB — VITAMIN B12: Vitamin B-12: >1504 pg/mL — ABNORMAL HIGH (ref 211–911)

## 2021-12-06 MED ORDER — CEPHALEXIN 500 MG PO CAPS
500.0000 mg | ORAL_CAPSULE | Freq: Two times a day (BID) | ORAL | 0 refills | Status: DC
  Filled 2021-12-06: qty 14, 7d supply, fill #0

## 2021-12-06 MED ORDER — CYANOCOBALAMIN 1000 MCG/ML IJ SOLN
1000.0000 ug | Freq: Once | INTRAMUSCULAR | Status: AC
  Administered 2021-12-06: 1000 ug via INTRAMUSCULAR

## 2021-12-06 NOTE — Patient Instructions (Signed)
I will treat for possible UTI today with keflex- will be in touch with your urine culture and labs We gave a B12 shot today- likely can give monthly but if your level is very low we may do weekly for a month or so- will be in touch  Please do discuss end of life concerns as a family- what does Melissa [name] want as far as CPR/ heart shocks/ breathing tube if she were to become very ill.  It is important for her family to understand her wishes  Take care

## 2021-12-07 LAB — URINE CULTURE
MICRO NUMBER:: 13048533
Result:: NO GROWTH
SPECIMEN QUALITY:: ADEQUATE

## 2021-12-08 ENCOUNTER — Encounter: Payer: Self-pay | Admitting: Family Medicine

## 2021-12-10 ENCOUNTER — Ambulatory Visit: Payer: Medicare Other | Admitting: Family Medicine

## 2021-12-18 ENCOUNTER — Emergency Department (HOSPITAL_BASED_OUTPATIENT_CLINIC_OR_DEPARTMENT_OTHER): Payer: Medicare Other

## 2021-12-18 ENCOUNTER — Encounter (HOSPITAL_BASED_OUTPATIENT_CLINIC_OR_DEPARTMENT_OTHER): Payer: Self-pay

## 2021-12-18 ENCOUNTER — Emergency Department (HOSPITAL_BASED_OUTPATIENT_CLINIC_OR_DEPARTMENT_OTHER): Payer: Medicare Other | Admitting: Radiology

## 2021-12-18 ENCOUNTER — Other Ambulatory Visit: Payer: Self-pay

## 2021-12-18 ENCOUNTER — Emergency Department (HOSPITAL_BASED_OUTPATIENT_CLINIC_OR_DEPARTMENT_OTHER)
Admission: EM | Admit: 2021-12-18 | Discharge: 2021-12-18 | Disposition: A | Discharge status: Home / Self Care | Payer: Medicare Other | Attending: Emergency Medicine | Admitting: Emergency Medicine

## 2021-12-18 DIAGNOSIS — Z743 Need for continuous supervision: Secondary | ICD-10-CM | POA: Diagnosis not present

## 2021-12-18 DIAGNOSIS — S92514A Nondisplaced fracture of proximal phalanx of right lesser toe(s), initial encounter for closed fracture: Secondary | ICD-10-CM | POA: Diagnosis not present

## 2021-12-18 DIAGNOSIS — I700 Atherosclerosis of aorta: Secondary | ICD-10-CM | POA: Diagnosis not present

## 2021-12-18 DIAGNOSIS — M47812 Spondylosis without myelopathy or radiculopathy, cervical region: Secondary | ICD-10-CM | POA: Diagnosis not present

## 2021-12-18 DIAGNOSIS — S0990XA Unspecified injury of head, initial encounter: Secondary | ICD-10-CM | POA: Insufficient documentation

## 2021-12-18 DIAGNOSIS — M79671 Pain in right foot: Secondary | ICD-10-CM | POA: Diagnosis not present

## 2021-12-18 DIAGNOSIS — W01198A Fall on same level from slipping, tripping and stumbling with subsequent striking against other object, initial encounter: Secondary | ICD-10-CM | POA: Diagnosis not present

## 2021-12-18 DIAGNOSIS — R911 Solitary pulmonary nodule: Secondary | ICD-10-CM | POA: Insufficient documentation

## 2021-12-18 DIAGNOSIS — M25572 Pain in left ankle and joints of left foot: Secondary | ICD-10-CM | POA: Diagnosis not present

## 2021-12-18 DIAGNOSIS — I4891 Unspecified atrial fibrillation: Secondary | ICD-10-CM | POA: Insufficient documentation

## 2021-12-18 DIAGNOSIS — R52 Pain, unspecified: Secondary | ICD-10-CM

## 2021-12-18 DIAGNOSIS — W19XXXA Unspecified fall, initial encounter: Secondary | ICD-10-CM | POA: Diagnosis not present

## 2021-12-18 DIAGNOSIS — S92515A Nondisplaced fracture of proximal phalanx of left lesser toe(s), initial encounter for closed fracture: Secondary | ICD-10-CM | POA: Diagnosis not present

## 2021-12-18 DIAGNOSIS — Z794 Long term (current) use of insulin: Secondary | ICD-10-CM | POA: Insufficient documentation

## 2021-12-18 DIAGNOSIS — Z7901 Long term (current) use of anticoagulants: Secondary | ICD-10-CM | POA: Insufficient documentation

## 2021-12-18 DIAGNOSIS — Y92002 Bathroom of unspecified non-institutional (private) residence single-family (private) house as the place of occurrence of the external cause: Secondary | ICD-10-CM | POA: Insufficient documentation

## 2021-12-18 DIAGNOSIS — S92512A Displaced fracture of proximal phalanx of left lesser toe(s), initial encounter for closed fracture: Secondary | ICD-10-CM

## 2021-12-18 DIAGNOSIS — S99922A Unspecified injury of left foot, initial encounter: Secondary | ICD-10-CM | POA: Diagnosis present

## 2021-12-18 DIAGNOSIS — Z043 Encounter for examination and observation following other accident: Secondary | ICD-10-CM | POA: Diagnosis not present

## 2021-12-18 DIAGNOSIS — R609 Edema, unspecified: Secondary | ICD-10-CM | POA: Diagnosis not present

## 2021-12-18 DIAGNOSIS — M79672 Pain in left foot: Secondary | ICD-10-CM | POA: Diagnosis not present

## 2021-12-18 DIAGNOSIS — M7989 Other specified soft tissue disorders: Secondary | ICD-10-CM | POA: Diagnosis not present

## 2021-12-18 MED ORDER — ACETAMINOPHEN 325 MG PO TABS
650.0000 mg | ORAL_TABLET | Freq: Once | ORAL | Status: AC
Start: 2021-12-18 — End: 2021-12-18
  Administered 2021-12-18: 650 mg via ORAL
  Filled 2021-12-18: qty 2

## 2021-12-18 NOTE — Discharge Instructions (Addendum)
It was a pleasure taking care of you today!  ? ?Your x-ray showed fracture of your right small toe, left 2nd, 4th, and 5th toes.  Attached is information for the on-call Orthopedist, call and set up a follow-up appoint regarding today's ED visit. Wear the postop shoes during the day to aid with ambulation.  Use your walker to aid with ambulation. You may be contacted by home health team to set up aid within the home with PT/OT. You may take over-the-counter 500 mg Tylenol every 6 hours as needed for pain. You may apply ice to the affected area for up to 15 minutes at a time.  Ensure to place a barrier between your skin and the ice.  Return to the Emergency Department if you are experiencing increasing/worsening swelling, bruising, pain, or worsening symptoms. ? ?Additionally, your CT chest showed densities noted throughout the lung.  It is recommended that you follow-up with your primary care provider for repeat CT chest within 3 months. ?

## 2021-12-18 NOTE — ED Provider Notes (Signed)
Melissa [name] Provider Note   CSN: [id number] Arrival date & time: 12/18/21  0946     History  Chief Complaint  Patient presents with   Fall   Foot Pain    Melissa [name] is a 88 y.o. female who presents to the Emergency Department brought in by PTAR from home complaining of a fall onset prior to arrival. Pt daughter reports that the patient got up to go use the bathroom when her toes were caught under the rug which caused her to fall.  Has not tried any medications for her symptoms.  Patient is on anticoagulants.  Has associated bilateral foot pain, bruising/swelling to bilateral feet.  Denies hitting her head, LOC, vision changes, vomiting, nausea, chest pain, shortness of breath. She is on Eliquis for afib.   The history is provided by the patient and a relative. No language interpreter was used.      Home Medications Prior to Admission medications   Medication Sig Start Date End Date Taking? Authorizing Provider  atorvastatin (LIPITOR) 40 MG tablet TAKE 1 TABLET DAILY 01/11/21  Yes Copland, Gay Filler, MD  cetirizine (ZYRTEC) 10 MG tablet Take 10 mg by mouth in the morning.   Yes [provider]  Cholecalciferol (VITAMIN D3) 1000 UNITS CAPS Take 1,000 Units by mouth every evening.   Yes [provider]  ELIQUIS 2.5 MG TABS tablet TAKE 1 TABLET TWICE A DAY 06/14/21  Yes [name], MD  Fe Fum-FePoly-Vit C-Vit B3 (INTEGRA) 62.5-62.5-40-3 MG CAPS TAKE 1 CAPSULE DAILY 07/16/21  Yes Copland, Gay Filler, MD  FLUoxetine (PROZAC) 20 MG capsule Take 1 capsule (20 mg total) by mouth daily. 10/17/21  Yes Dohmeier, Asencion Partridge, MD  formoterol (PERFOROMIST) 20 MCG/2ML nebulizer solution Take 2 mLs (20 mcg total) by nebulization 2 (two) times daily. 03/06/20  Yes Lauraine Rinne, NP  furosemide (LASIX) 40 MG tablet TAKE 1 TABLET DAILY 07/16/21  Yes Copland, Gay Filler, MD  HUMALOG KWIKPEN 100 UNIT/ML KwikPen Inject 6 units under the skin daily with supper. 02/24/20   Yes [name], MD  insulin detemir (LEVEMIR FLEXTOUCH) 100 UNIT/ML FlexPen Inject 25 Units into the skin at bedtime. 05/23/21  Yes [name], MD  levothyroxine (SYNTHROID) 50 MCG tablet TAKE 1 TABLET DAILY BEFORE BREAKFAST Patient taking differently: Take 50 mcg by mouth daily before breakfast. 06/27/21  Yes Copland, Gay Filler, MD  metoprolol tartrate (LOPRESSOR) 50 MG tablet TAKE 1.5 TABLETS BY MOUTH 2 TIMES DAILY. 11/12/21  Yes [name], MD  Multiple Vitamins-Minerals (ICAPS AREDS 2) CAPS Take 1 capsule by mouth 2 (two) times daily.   Yes [provider]  Omega-3 Fatty Acids (FISH OIL) 1000 MG CAPS Take 1,000 mg by mouth at bedtime.   Yes [provider]  pantoprazole (PROTONIX) 40 MG tablet Take 1 tablet (40 mg total) by mouth 2 (two) times daily. Continue taking once a day after finishing the course of twice a day for  2 months 04/10/21 12/18/21 Yes Adhikari, Tamsen Meek, MD  polyethylene glycol (MIRALAX / GLYCOLAX) 17 g packet Take 17 g by mouth daily as needed (constipation.). 05/23/21  Yes [name], MD  pramipexole (MIRAPEX) 0.25 MG tablet Take 1 tablet (0.25 mg total) by mouth daily with supper. Take 1 tablet at 5 pm 10/17/21  Yes Dohmeier, Asencion Partridge, MD  pregabalin (LYRICA) 50 MG capsule Take 2 capsules (100 mg total) by mouth at bedtime. 10/05/21  Yes Copland, Gay Filler, MD  QUEtiapine (SEROQUEL) 50  MG tablet Take 2 tablets (100 mg total) by mouth at bedtime. 10/17/21  Yes Dohmeier, Asencion Partridge, MD  revefenacin [facility]) 175 MCG/3ML nebulizer solution Take 3 mLs (175 mcg total) by nebulization daily. 03/06/20  Yes Lauraine Rinne, NP  Semaglutide,0.25 or 0.5MG/DOS, (OZEMPIC, 0.25 OR 0.5 MG/DOSE,) 2 MG/1.5ML SOPN Inject 0.5 mg into the skin every Monday. 10/08/21  Yes Copland, Gay Filler, MD  sucralfate (CARAFATE) 1 GM/10ML suspension Take 10 mLs (1 g total) by mouth 2 (two) times daily. Use as needed for stomach pain 05/08/21  Yes Copland, Gay Filler, MD  sulfamethoxazole-trimethoprim  (BACTRIM) 400-80 MG tablet Take 1 tablet by mouth 2 (two) times daily. 08/30/21  Yes Copland, Gay Filler, MD  traZODone (DESYREL) 100 MG tablet Take 1 tablet (100 mg total) by mouth at bedtime. 10/17/21  Yes Dohmeier, Asencion Partridge, MD  vitamin B-12 (CYANOCOBALAMIN) 1000 MCG tablet Take 1 tablet (1,000 mcg total) by mouth daily. 11/21/21  Yes Copland, Gay Filler, MD  acetaminophen (TYLENOL) 500 MG tablet Take 500-1,000 mg by mouth every 6 (six) hours as needed (pain.).    [provider]  blood glucose meter kit and supplies KIT Dispense based on patient and insurance preference. Use up to four times daily as directed. 01/13/21   Copland, Gay Filler, MD  Blood Glucose Monitoring Suppl DEVI Dispense one meter to use and monitor glucose as needed 12/18/20   Copland, Gay Filler, MD  cephALEXin (KEFLEX) 500 MG capsule Take 1 capsule (500 mg total) by mouth 2 (two) times daily. Patient not taking: Reported on 12/18/2021 12/06/21   Copland, Gay Filler, MD  [facility] DELICA LANCETS 89F MISC Use to check blood sugar 4 times per day dx code E11.29 01/19/18   [name], MD  [facility] VERIO test strip USE AS INSTRUCTED TO CHECK BLOOD SUGAR 4 TIMES PER DAY 01/11/21   [name], MD  Respiratory Therapy Supplies (FLUTTER) DEVI 1 Device by Does not apply route as needed. 02/04/20   Lauraine Rinne, NP      Allergies    Patient has no known allergies.    Review of Systems   Review of Systems  Constitutional:  Negative for chills and fever.  HENT:  Positive for rhinorrhea. Negative for congestion and sore throat.   Eyes:  Negative for visual disturbance.  Respiratory:  Negative for cough and shortness of breath.   Cardiovascular:  Negative for chest pain.  Gastrointestinal:  Negative for abdominal pain, nausea and vomiting.  Musculoskeletal:  Positive for arthralgias and joint swelling.  Skin:  Positive for color change. Negative for rash and wound.  Neurological:  Negative for dizziness, syncope and light-headedness.  All  other systems reviewed and are negative.  Physical Exam Updated Vital Signs BP 120/70 (BP Location: Left Arm)    Pulse 83    Temp 97.9 F (36.6 C)    Resp 16    SpO2 100%  Physical Exam Vitals and nursing note reviewed.  Constitutional:      General: She is not in acute distress.    Appearance: Normal appearance. She is not diaphoretic.  HENT:     Head: Normocephalic and atraumatic.     Mouth/Throat:     Pharynx: No oropharyngeal exudate.  Eyes:     General: No scleral icterus.    Extraocular Movements: Extraocular movements intact.     Conjunctiva/sclera: Conjunctivae normal.  Cardiovascular:     Rate and Rhythm: Normal rate and regular rhythm.     Pulses: Normal pulses.  Heart sounds: Normal heart sounds.  Pulmonary:     Effort: Pulmonary effort is normal. No respiratory distress.     Breath sounds: Normal breath sounds. No wheezing.  Abdominal:     General: Bowel sounds are normal.     Palpations: Abdomen is soft. There is no mass.     Tenderness: There is no abdominal tenderness. There is no guarding or rebound.  Musculoskeletal:        General: Normal range of motion.     Cervical back: Normal range of motion and neck supple.     Comments: Ecchymosis noted to right fifth digit without tenderness to palpation.  Ecchymosis noted to left second-fifth digits with mild tenderness to palpation.  Pedal pulses intact bilaterally.  Full active range of motion of bilateral lower extremities.  No tenderness to palpation noted to bilateral calves.  No tenderness to palpation noted to bilateral bases of the fifth MTPs.  Capillary refill less than 2 seconds.  Mild tenderness to palpation noted to left ankle without swelling.  Skin:    General: Skin is warm and dry.     Findings: No bruising, erythema or rash.  Neurological:     Mental Status: She is alert.     Cranial Nerves: Cranial nerves 2-12 are intact.     Sensory: Sensation is intact.     Motor: No pronator drift.      Comments: No focal neurological deficits.  Sensation intact.  Negative pronator drift.   Grip strength 5/5 bilaterally.  Strength and sensation intact to bilateral upper and lower extremities.  Psychiatric:        Behavior: Behavior normal.    ED Results / Procedures / Treatments   Labs (all labs ordered are listed, but only abnormal results are displayed) Labs Reviewed - No data to display  EKG None  Radiology DG Ankle Complete Left  Result Date: 12/18/2021 CLINICAL DATA:  Ankle pain after fall EXAM: LEFT ANKLE COMPLETE - 3+ VIEW COMPARISON:  None. FINDINGS: There is no evidence of fracture, dislocation, or joint effusion. There is no evidence of arthropathy or other focal bone abnormality. Soft tissues are unremarkable. IMPRESSION: Negative. Electronically Signed   By: Macy Mis M.D.   On: 12/18/2021 12:53   CT Head Wo Contrast  Result Date: 12/18/2021 CLINICAL DATA:  88 year old female with history of minor head trauma from a fall. EXAM: CT HEAD WITHOUT CONTRAST CT CERVICAL SPINE WITHOUT CONTRAST TECHNIQUE: Multidetector CT imaging of the head and cervical spine was performed following the standard protocol without intravenous contrast. Multiplanar CT image reconstructions of the cervical spine were also generated. RADIATION DOSE REDUCTION: This exam was performed according to the departmental dose-optimization program which includes automated exposure control, adjustment of the mA and/or kV according to patient size and/or use of iterative reconstruction technique. COMPARISON:  Head CT 08/14/2021. FINDINGS: CT HEAD FINDINGS Brain: Mild cerebral and moderate cerebellar atrophy. Patchy and confluent areas of decreased attenuation are noted throughout the deep and periventricular white matter of the cerebral hemispheres bilaterally, compatible with chronic microvascular ischemic disease. No evidence of acute infarction, hemorrhage, hydrocephalus, extra-axial collection or mass lesion/mass  effect. Vascular: No hyperdense vessel or unexpected calcification. Skull: Normal. Negative for fracture or focal lesion. Sinuses/Orbits: No acute finding. Other: None. CT CERVICAL SPINE FINDINGS Alignment: 2 mm of anterolisthesis of C4 upon C5, likely chronic and degenerative in nature. Alignment is otherwise anatomic. Skull base and vertebrae: No acute fracture. No primary bone lesion or focal pathologic process. Soft  tissues and spinal canal: No prevertebral fluid or swelling. No visible canal hematoma. Disc levels: Multilevel degenerative disc disease, most pronounced at C5-C6 and C6-C7. Moderate multilevel facet arthropathy. Upper chest: Patchy areas of pleuroparenchymal thickening and nodular architectural distortion in the apices of the lungs bilaterally, along with some patchy ground-glass attenuation and septal thickening. Other: None. IMPRESSION: 1. No evidence of significant acute traumatic injury to the skull, brain or cervical spine. 2. Mild cerebral and moderate cerebellar atrophy with chronic microvascular ischemic changes in the cerebral white matter, as above. 3. Multilevel degenerative disc disease and cervical spondylosis, as above. 4. Unusual appearance of the lung apices. Although some of these findings may be chronic, the possibility of active pulmonary infection is not excluded. Further evaluation with noncontrast chest CT is recommended at this time to better evaluate these findings. Electronically Signed   By: Vinnie Langton M.D.   On: 12/18/2021 10:34   CT Chest Wo Contrast  Result Date: 12/18/2021 CLINICAL DATA:  Rule out pneumonia. EXAM: CT CHEST WITHOUT CONTRAST TECHNIQUE: Multidetector CT imaging of the chest was performed following the standard protocol without IV contrast. RADIATION DOSE REDUCTION: This exam was performed according to the departmental dose-optimization program which includes automated exposure control, adjustment of the mA and/or kV according to patient size  and/or use of iterative reconstruction technique. COMPARISON:  CT chest dated September 01, 2019; chest radiograph dated August 14, 2021 FINDINGS: Cardiovascular: Normal heart size. No pericardial effusion. Coronary artery atherosclerotic calcifications. No pericardial effusion. Atherosclerotic calcification of aortic arch. Aorta is normal in caliber. Mediastinum/Nodes: No enlarged mediastinal or axillary lymph nodes. Thyroid gland, trachea, and esophagus demonstrate no significant findings., Lungs/Pleura: Biapical pleural/parenchymal scarring. Multiple peripheral pleural-based and parenchymal nodular densities. For example 8 mm density in the posterior aspect of the right upper lobe. Adjacent 10 mm density in the right upper lobe. Another 8 mm density in the right upper lobe. 9 mm cyst in the left upper lobe. Another 10 mm density in the posterior aspect of the right lower lobe. No pleural effusion or pneumothorax. Upper Abdomen: Cholecystectomy changes. Musculoskeletal: No chest wall mass or suspicious bone lesions identified. IMPRESSION: Compared to prior CT examination of 1. There are multiple pleural and parenchymal nodular densities scattered throughout the bilateral lungs predominantly in the lower lobes as detailed above. These are likely sequela of prior infectious/inflammatory process. Neoplastic process although less likely can not be excluded. Short-term follow-up examination in 3 months for stability is recommended. 2.  Mild peripheral fibrotic changes. 3.  No definite evidence of acute pneumonia. Aortic Atherosclerosis (ICD10-I70.0). Electronically Signed   By: Keane Police D.O.   On: 12/18/2021 14:57   CT Cervical Spine Wo Contrast  Result Date: 12/18/2021 CLINICAL DATA:  88 year old female with history of minor head trauma from a fall. EXAM: CT HEAD WITHOUT CONTRAST CT CERVICAL SPINE WITHOUT CONTRAST TECHNIQUE: Multidetector CT imaging of the head and cervical spine was performed following the  standard protocol without intravenous contrast. Multiplanar CT image reconstructions of the cervical spine were also generated. RADIATION DOSE REDUCTION: This exam was performed according to the departmental dose-optimization program which includes automated exposure control, adjustment of the mA and/or kV according to patient size and/or use of iterative reconstruction technique. COMPARISON:  Head CT 08/14/2021. FINDINGS: CT HEAD FINDINGS Brain: Mild cerebral and moderate cerebellar atrophy. Patchy and confluent areas of decreased attenuation are noted throughout the deep and periventricular white matter of the cerebral hemispheres bilaterally, compatible with chronic microvascular ischemic disease. No evidence  of acute infarction, hemorrhage, hydrocephalus, extra-axial collection or mass lesion/mass effect. Vascular: No hyperdense vessel or unexpected calcification. Skull: Normal. Negative for fracture or focal lesion. Sinuses/Orbits: No acute finding. Other: None. CT CERVICAL SPINE FINDINGS Alignment: 2 mm of anterolisthesis of C4 upon C5, likely chronic and degenerative in nature. Alignment is otherwise anatomic. Skull base and vertebrae: No acute fracture. No primary bone lesion or focal pathologic process. Soft tissues and spinal canal: No prevertebral fluid or swelling. No visible canal hematoma. Disc levels: Multilevel degenerative disc disease, most pronounced at C5-C6 and C6-C7. Moderate multilevel facet arthropathy. Upper chest: Patchy areas of pleuroparenchymal thickening and nodular architectural distortion in the apices of the lungs bilaterally, along with some patchy ground-glass attenuation and septal thickening. Other: None. IMPRESSION: 1. No evidence of significant acute traumatic injury to the skull, brain or cervical spine. 2. Mild cerebral and moderate cerebellar atrophy with chronic microvascular ischemic changes in the cerebral white matter, as above. 3. Multilevel degenerative disc disease  and cervical spondylosis, as above. 4. Unusual appearance of the lung apices. Although some of these findings may be chronic, the possibility of active pulmonary infection is not excluded. Further evaluation with noncontrast chest CT is recommended at this time to better evaluate these findings. Electronically Signed   By: Vinnie Langton M.D.   On: 12/18/2021 10:34   DG Foot Complete Left  Result Date: 12/18/2021 CLINICAL DATA:  Non displaced fracture EXAM: LEFT FOOT - COMPLETE 3+ VIEW COMPARISON:  None. FINDINGS: A minimally displaced fracture present in the proximal phalanx of the fifth digit. A minimally displaced fracture is present the distal aspect of the proximal phalanx in the second digit. A nondisplaced fracture is present in the proximal phalanx of the fourth digit. No fractures are present more proximally in the foot. Soft tissue swelling is noted over the lateral aspect of the fifth MTP joint. IMPRESSION: 1. Minimally displaced fractures involving the proximal phalanges of the second, fourth, and fifth digits. 2. Soft tissue swelling over the lateral aspect of the fifth MTP joint. Electronically Signed   By: San Morelle M.D.   On: 12/18/2021 10:26   DG Foot Complete Right  Result Date: 12/18/2021 CLINICAL DATA:  Bilateral foot pain with bruising and swelling. Fall this morning. EXAM: RIGHT FOOT COMPLETE - 3+ VIEW COMPARISON:  03/19/2021 FINDINGS: Oblique nondisplaced fracture of the shaft of the proximal phalanx small toe extending to the proximal metaphysis. Expected alignment at the Lisfranc joint. Exaggerated longitudinal arch of the foot (may be positional). Small os peroneus. Mild soft tissue swelling along the dorsum of the foot. IMPRESSION: 1. Nondisplaced oblique fracture of the proximal phalanx small toe. Electronically Signed   By: Van Clines M.D.   On: 12/18/2021 10:31    Procedures Procedures    Medications Ordered in ED Medications  acetaminophen (TYLENOL)  tablet 650 mg (650 mg Oral Given 12/18/21 1654)    ED Course/ Medical Decision Making/ A&P Clinical Course as of 12/18/21 [place] Dec 18, 2021  1557 Notified that patient was having difficulty with ambulating due to fractures.  Discussed with daughter at bedside regarding home health.  Daughter agreeable at this time. [SB]  1648 Discussed discharge treatment plan with daughter and patient at bedside. Both agreeable at this time. Pt daughter notes that patient has a walker with a seat attached at home that she uses to aid with ambulation. Discussed with daughter to ensure that patient uses her walker to aid with ambulation while waiting for home  health PT/OT. Daughter agreeable. Pt appears safe for discharge. [SB]    Clinical Course User Index [SB] Jonte Wollam A, PA-C                           Medical Decision Making Amount and/or Complexity of Data Reviewed Radiology: ordered.  Risk OTC drugs.   Pt with fall occurring prior to arrival.  She was ambulating to go use the bathroom and her toes are caught on a rash what caused her fall.  Patient is currently on Eliquis.  Denies LOC, headache, vision change, nausea, vomiting, chest pain, shortness of breath.  Vital signs stable, patient afebrile, not tachycardic or hypoxic.  On exam patient with ecchymosis without TTP to right fifth toe.  Ecchymosis and mild TTP noted to left second-fifth toes.  Pedal pulses intact bilaterally.  Strength and sensation intact to bilateral lower extremities.  No TTP noted to bilateral base of fifth MTPs.  Capillary refill less than 2 seconds.  Full active range of motion of bilateral lower extremities.  Mild tenderness to palpation noted to left ankle without swelling.  No acute cardiovascular, respiratory, abdominal exam findings.  Differential diagnosis includes SAH, fracture, dislocation, contusion.  Imaging: I ordered imaging studies including left foot x-ray, right foot x-ray, left ankle x-ray, CT cervical  spine, CT head, CT chest I independently visualized and interpreted imaging which showed: Left foot xray:  1. Minimally displaced fractures involving the proximal phalanges of  the second, fourth, and fifth digits.  2. Soft tissue swelling over the lateral aspect of the fifth MTP  joint.  Right foot xray:  1. Nondisplaced oblique fracture of the proximal phalanx small toe. Left ankle xray: no acute fracture or dislocation.  CT head and cervical spine:  1. No evidence of significant acute traumatic injury to the skull,  brain or cervical spine.  2. Mild cerebral and moderate cerebellar atrophy with chronic  microvascular ischemic changes in the cerebral white matter, as  above.  3. Multilevel degenerative disc disease and cervical spondylosis, as  above.  4. Unusual appearance of the lung apices. Although some of these  findings may be chronic, the possibility of active pulmonary  infection is not excluded. Further evaluation with noncontrast chest  CT is recommended at this time to better evaluate these findings.    CT Chest:  1. There are multiple pleural and parenchymal nodular densities  scattered throughout the bilateral lungs predominantly in the lower  lobes as detailed above. These are likely sequela of prior  infectious/inflammatory process. Neoplastic process although less  likely can not be excluded. Short-term follow-up examination in 3  months for stability is recommended.  2.  Mild peripheral fibrotic changes.  3.  No definite evidence of acute pneumonia.  Aortic Atherosclerosis (ICD10-I70.0).   I agree with the radiologist interpretation  Medications:  I ordered medication including Tylenol for pain management Reevaluation of the patient after these medicines and interventions, I reevaluated the patient and found that they have improved I have reviewed the patients home medicines and have made adjustments as needed   Disposition: Patient presentation  suspicious for fracture of bilateral toes.  Doubt dislocation, contusion.  Doubt intracranial abnormality. After consideration of the diagnostic results and the patients response to treatment, I feel that the patient would benefit from Discharge home.  We will provide information for on-call orthopedist and inform patient to set up a follow-up appointment regarding today's ED visit.  Patient provided  with buddy tape and postop shoes to both feet.  Discussed with patient and daughter at bedside regarding need for follow-up with primary care provider in 3 months for recommended CT chest repeat due to densities noted on exam.  Patient and daughter agreeable at this time.  Supportive care and return precautions discussed with patient and significant other. Pt appears safe for discharge. Follow up as indicated in discharge paperwork.   This chart was dictated using voice recognition software, Dragon. Despite the best efforts of this provider to proofread and correct errors, errors may still occur which can change documentation meaning.  Final Clinical Impression(s) / ED Diagnoses Final diagnoses:  Pain  Displaced fracture of proximal phalanx of left lesser toe(s), initial encounter for closed fracture  Nondisplaced fracture of proximal phalanx of right lesser toe(s), initial encounter for closed fracture    Rx / DC Orders ED Discharge Orders          Ordered    Ambulatory referral to Home Health       Comments: Please evaluate [name] for admission to [facility].  Disciplines requested: Physical Therapy and Occupational Therapy  Services to provide: Strengthening Exercises and Evaluate  Physician to follow patient's care (the person listed here will be responsible for signing ongoing orders): PCP  Requested Start of Care Date: Tomorrow  I certify that this patient is under my care and that I, or a Nurse Practitioner or Physician's Assistant working with me, had a face-to-face encounter  that meets the physician face-to-face requirements with patient on 12/18/2021. The encounter with the patient was in whole, or in part for the following medical condition(s) which is the primary reason for home health care (List medical condition). Fractures to bilateral toes  Special Instructions:  N/a   12/18/21 1640              Kenya Shiraishi A, PA-C 12/18/21 1708    Pattricia Boss, MD 12/18/21 1739

## 2021-12-18 NOTE — ED Triage Notes (Addendum)
Pt BIB [facility] from home, pt lives with her daughter. Per daughter approx 0530 this am pt got up to go to the bathroom, her toes got caught under the bathroom rug causing her to fall landing on the floor. Pt denies LOC or hitting her head. Does endorse bilateral foot pain. Bruising and swelling noted to the dorsal aspect of her feet radiating into her toes. Palpable pulses to BLE  ? ? ?BP 148/70 ?HR 84 ?RR 16 ?91% RA  ?CBG 204 ? ?Has not eaten breakfast or taking her morning meds ? ?Pt is on blood thinners  ?

## 2021-12-19 ENCOUNTER — Telehealth: Payer: Self-pay | Admitting: Family Medicine

## 2021-12-19 DIAGNOSIS — S92505D Nondisplaced unspecified fracture of left lesser toe(s), subsequent encounter for fracture with routine healing: Secondary | ICD-10-CM

## 2021-12-19 NOTE — Telephone Encounter (Signed)
I called and spoke with Melissa [name] daughter.  Unfortunately Melissa [name] suffered fractures in the toes of both feet.  She broke 3 toes on the left foot and one toe on the right.  She is already quite elderly and has some difficulty with ambulation.  Melissa [name] is struggling with her care at home.  We discussed potentially going back to the ER so Melissa [name] could be admitted and then transferred to rehab.  Melissa [name] would like to try make this work at home if we can.  I will order home health urgently to provide assistance ?

## 2021-12-19 NOTE — Addendum Note (Signed)
Addended by: Lamar Blinks C on: 12/19/2021 07:56 PM ? ? Modules accepted: Orders ? ?

## 2021-12-19 NOTE — Telephone Encounter (Signed)
Okay fr orders?  ?

## 2021-12-19 NOTE — Telephone Encounter (Signed)
Pt's daughter would like HH for the pt  ? ?Pt was seen in ED 3/7 for fall which landed with the pt fracturing both toes & wearing walker boots  ? ?Please advise  ?

## 2021-12-20 ENCOUNTER — Telehealth: Payer: Self-pay | Admitting: Pharmacist

## 2021-12-20 ENCOUNTER — Other Ambulatory Visit: Payer: Self-pay

## 2021-12-20 ENCOUNTER — Emergency Department (HOSPITAL_COMMUNITY)
Admission: EM | Admit: 2021-12-20 | Discharge: 2021-12-20 | Disposition: A | Discharge status: Home / Self Care | Payer: Medicare Other | Attending: Emergency Medicine | Admitting: Emergency Medicine

## 2021-12-20 ENCOUNTER — Encounter (HOSPITAL_COMMUNITY): Payer: Self-pay

## 2021-12-20 DIAGNOSIS — E860 Dehydration: Secondary | ICD-10-CM | POA: Diagnosis not present

## 2021-12-20 DIAGNOSIS — Z743 Need for continuous supervision: Secondary | ICD-10-CM | POA: Diagnosis not present

## 2021-12-20 DIAGNOSIS — Z20822 Contact with and (suspected) exposure to covid-19: Secondary | ICD-10-CM | POA: Insufficient documentation

## 2021-12-20 DIAGNOSIS — Z7901 Long term (current) use of anticoagulants: Secondary | ICD-10-CM | POA: Insufficient documentation

## 2021-12-20 DIAGNOSIS — R410 Disorientation, unspecified: Secondary | ICD-10-CM | POA: Diagnosis not present

## 2021-12-20 DIAGNOSIS — N390 Urinary tract infection, site not specified: Secondary | ICD-10-CM | POA: Insufficient documentation

## 2021-12-20 DIAGNOSIS — R6889 Other general symptoms and signs: Secondary | ICD-10-CM | POA: Diagnosis not present

## 2021-12-20 DIAGNOSIS — R4182 Altered mental status, unspecified: Secondary | ICD-10-CM | POA: Insufficient documentation

## 2021-12-20 DIAGNOSIS — W19XXXA Unspecified fall, initial encounter: Secondary | ICD-10-CM | POA: Diagnosis not present

## 2021-12-20 LAB — CBC WITH DIFFERENTIAL/PLATELET
Abs Immature Granulocytes: 0.04 10*3/uL (ref 0.00–0.07)
Basophils Absolute: 0.1 10*3/uL (ref 0.0–0.1)
Basophils Relative: 1 %
Eosinophils Absolute: 0.4 10*3/uL (ref 0.0–0.5)
Eosinophils Relative: 5 %
HCT: 42 % (ref 36.0–46.0)
Hemoglobin: 13.1 g/dL (ref 12.0–15.0)
Immature Granulocytes: 1 %
Lymphocytes Relative: 24 %
Lymphs Abs: 2 10*3/uL (ref 0.7–4.0)
MCH: 27.8 pg (ref 26.0–34.0)
MCHC: 31.2 g/dL (ref 30.0–36.0)
MCV: 89 fL (ref 80.0–100.0)
Monocytes Absolute: 0.6 10*3/uL (ref 0.1–1.0)
Monocytes Relative: 8 %
Neutro Abs: 5 10*3/uL (ref 1.7–7.7)
Neutrophils Relative %: 61 %
Platelets: 161 10*3/uL (ref 150–400)
RBC: 4.72 MIL/uL (ref 3.87–5.11)
RDW: 12.6 % (ref 11.5–15.5)
WBC: 8 10*3/uL (ref 4.0–10.5)
nRBC: 0 % (ref 0.0–0.2)

## 2021-12-20 LAB — URINALYSIS, ROUTINE W REFLEX MICROSCOPIC
Bilirubin Urine: NEGATIVE
Glucose, UA: NEGATIVE mg/dL
Hgb urine dipstick: NEGATIVE
Ketones, ur: NEGATIVE mg/dL
Nitrite: NEGATIVE
Protein, ur: NEGATIVE mg/dL
Specific Gravity, Urine: 1.009 (ref 1.005–1.030)
WBC, UA: >50 WBC/hpf — ABNORMAL HIGH (ref 0–5)
pH: 5 (ref 5.0–8.0)

## 2021-12-20 LAB — BASIC METABOLIC PANEL
Anion gap: 7 (ref 5–15)
BUN: 30 mg/dL — ABNORMAL HIGH (ref 8–23)
CO2: 34 mmol/L — ABNORMAL HIGH (ref 22–32)
Calcium: 8.4 mg/dL — ABNORMAL LOW (ref 8.9–10.3)
Chloride: 98 mmol/L (ref 98–111)
Creatinine, Ser: 1.91 mg/dL — ABNORMAL HIGH (ref 0.44–1.00)
GFR, Estimated: 24 mL/min — ABNORMAL LOW (ref >60)
Glucose, Bld: 176 mg/dL — ABNORMAL HIGH (ref 70–99)
Potassium: 4.4 mmol/L (ref 3.5–5.1)
Sodium: 139 mmol/L (ref 135–145)

## 2021-12-20 LAB — RESP PANEL BY RT-PCR (FLU A&B, COVID) ARPGX2
Influenza A by PCR: NEGATIVE
Influenza B by PCR: NEGATIVE
SARS Coronavirus 2 by RT PCR: NEGATIVE

## 2021-12-20 MED ORDER — SODIUM CHLORIDE 0.9 % IV BOLUS
500.0000 mL | Freq: Once | INTRAVENOUS | Status: AC
  Administered 2021-12-20: 17:00:00 500 mL via INTRAVENOUS

## 2021-12-20 MED ORDER — SODIUM CHLORIDE 0.9 % IV SOLN
1.0000 g | Freq: Once | INTRAVENOUS | Status: AC
  Administered 2021-12-20: 20:00:00 1 g via INTRAVENOUS
  Filled 2021-12-20: qty 10

## 2021-12-20 MED ORDER — CEPHALEXIN 250 MG PO CAPS: 250.0000 mg | ORAL_CAPSULE | Freq: Four times a day (QID) | ORAL | 0 refills | Status: DC

## 2021-12-20 NOTE — ED Notes (Signed)
Pt was given ham and [place] sandwich sandwich ?

## 2021-12-20 NOTE — Telephone Encounter (Signed)
During phone visit with patient's daughter Melissa [name] today she mentioned that Melissa [name] was taken to [name] today for lethargy.  ?Patient's daughter wanted Dr Lorelei Pont to be aware.  ?

## 2021-12-20 NOTE — ED Notes (Signed)
Call to lab to add culture to urine ?

## 2021-12-20 NOTE — ED Provider Notes (Signed)
[facility] DEPT Provider Note   CSN: [id number] Arrival date & time: 12/20/21  1601     History  Chief Complaint  Patient presents with   Altered Mental Status    Melissa [name] is a 88 y.o. female.  HPI Patient presenting from home, after daughter noticed that she was weak and confused following a nap.  Apparently the patient got up and walked to the bathroom and locked himself inside when the daughter found her there.  Took the daughter a while to get her out of the bathroom.  She was able to give her some food at that time.  Patient was at her baseline, this morning before she took a nap around noon.  She was in the ED, 2 days ago after a fall with injuries to her toes of her left foot.  She has 3 toe fractures on the left and 1 on the right.  She was given postop shoes to help her ambulate.  She is currently taking only Tylenol for pain.  When she was seen she had additional imaging, CT chest, CT cervical spine and CT head.  These were negative for injury.  Patient's daughter is with her.    Home Medications Prior to Admission medications   Medication Sig Start Date End Date Taking? Authorizing Provider  cephALEXin (KEFLEX) 250 MG capsule Take 1 capsule (250 mg total) by mouth 4 (four) times daily. 12/20/21  Yes Daleen Bo, MD  acetaminophen (TYLENOL) 500 MG tablet Take 500-1,000 mg by mouth every 6 (six) hours as needed (pain.).    [provider]  atorvastatin (LIPITOR) 40 MG tablet TAKE 1 TABLET DAILY 01/11/21   Copland, Gay Filler, MD  blood glucose meter kit and supplies KIT Dispense based on patient and insurance preference. Use up to four times daily as directed. 01/13/21   Copland, Gay Filler, MD  Blood Glucose Monitoring Suppl DEVI Dispense one meter to use and monitor glucose as needed 12/18/20   Copland, Gay Filler, MD  cetirizine (ZYRTEC) 10 MG tablet Take 10 mg by mouth in the morning.    [provider]  Cholecalciferol (VITAMIN  D3) 1000 UNITS CAPS Take 1,000 Units by mouth every evening.    [provider]  ELIQUIS 2.5 MG TABS tablet TAKE 1 TABLET TWICE A DAY 06/14/21   [name], MD  Fe Fum-FePoly-Vit C-Vit B3 (INTEGRA) 62.5-62.5-40-3 MG CAPS TAKE 1 CAPSULE DAILY 07/16/21   Copland, Gay Filler, MD  FLUoxetine (PROZAC) 20 MG capsule Take 1 capsule (20 mg total) by mouth daily. 10/17/21   Dohmeier, Asencion Partridge, MD  formoterol (PERFOROMIST) 20 MCG/2ML nebulizer solution Take 2 mLs (20 mcg total) by nebulization 2 (two) times daily. 03/06/20   Lauraine Rinne, NP  furosemide (LASIX) 40 MG tablet TAKE 1 TABLET DAILY 07/16/21   Copland, Gay Filler, MD  HUMALOG KWIKPEN 100 UNIT/ML KwikPen Inject 6 units under the skin daily with supper. 02/24/20   [name], MD  insulin detemir (LEVEMIR FLEXTOUCH) 100 UNIT/ML FlexPen Inject 25 Units into the skin at bedtime. 05/23/21   [name], MD  levothyroxine (SYNTHROID) 50 MCG tablet TAKE 1 TABLET DAILY BEFORE BREAKFAST Patient taking differently: Take 50 mcg by mouth daily before breakfast. 06/27/21   Copland, Gay Filler, MD  metoprolol tartrate (LOPRESSOR) 50 MG tablet TAKE 1.5 TABLETS BY MOUTH 2 TIMES DAILY. 11/12/21   [name], MD  Multiple Vitamins-Minerals (ICAPS AREDS 2) CAPS Take 1 capsule by mouth 2 (two)  times daily.    [provider]  Omega-3 Fatty Acids (FISH OIL) 1000 MG CAPS Take 1,000 mg by mouth at bedtime.    [provider]  Jonetta Speak LANCETS 54Y MISC Use to check blood sugar 4 times per day dx code E11.29 01/19/18   [name], MD  [facility] VERIO test strip USE AS INSTRUCTED TO CHECK BLOOD SUGAR 4 TIMES PER DAY 01/11/21   [name], MD  pantoprazole (PROTONIX) 40 MG tablet Take 1 tablet (40 mg total) by mouth 2 (two) times daily. Continue taking once a day after finishing the course of twice a day for  2 months 04/10/21 12/18/21  Shelly Coss, MD  polyethylene glycol (MIRALAX / GLYCOLAX) 17 g packet Take 17 g by mouth daily as needed  (constipation.). 05/23/21   [name], MD  pramipexole (MIRAPEX) 0.25 MG tablet Take 1 tablet (0.25 mg total) by mouth daily with supper. Take 1 tablet at 5 pm 10/17/21   Dohmeier, Asencion Partridge, MD  pregabalin (LYRICA) 50 MG capsule Take 2 capsules (100 mg total) by mouth at bedtime. 10/05/21   Copland, Gay Filler, MD  QUEtiapine (SEROQUEL) 50 MG tablet Take 2 tablets (100 mg total) by mouth at bedtime. 10/17/21   Dohmeier, Asencion Partridge, MD  Respiratory Therapy Supplies (FLUTTER) DEVI 1 Device by Does not apply route as needed. 02/04/20   Lauraine Rinne, NP  revefenacin (YUPELRI) 175 MCG/3ML nebulizer solution Take 3 mLs (175 mcg total) by nebulization daily. 03/06/20   Lauraine Rinne, NP  Semaglutide,0.25 or 0.5MG/DOS, (OZEMPIC, 0.25 OR 0.5 MG/DOSE,) 2 MG/1.5ML SOPN Inject 0.5 mg into the skin every Monday. 10/08/21   Copland, Gay Filler, MD  sucralfate (CARAFATE) 1 GM/10ML suspension Take 10 mLs (1 g total) by mouth 2 (two) times daily. Use as needed for stomach pain 05/08/21   Copland, Gay Filler, MD  sulfamethoxazole-trimethoprim (BACTRIM) 400-80 MG tablet Take 1 tablet by mouth 2 (two) times daily. 08/30/21   Copland, Gay Filler, MD  traZODone (DESYREL) 100 MG tablet Take 1 tablet (100 mg total) by mouth at bedtime. 10/17/21   Dohmeier, Asencion Partridge, MD  vitamin B-12 (CYANOCOBALAMIN) 1000 MCG tablet Take 1 tablet (1,000 mcg total) by mouth daily. 11/21/21   Copland, Gay Filler, MD      Allergies    Patient has no known allergies.    Review of Systems   Review of Systems  Physical Exam Updated Vital Signs BP 127/64    Pulse 88    Temp 97.7 F (36.5 C) (Oral)    Resp 15    SpO2 92%  Physical Exam Vitals and nursing note reviewed.  Constitutional:      General: She is not in acute distress.    Appearance: She is well-developed. She is not ill-appearing, toxic-appearing or diaphoretic.     Comments: She is somewhat drowsy, but responsive and cooperative.  She is able to follow commands accurately.  HENT:     Head:  Normocephalic and atraumatic.     Right Ear: External ear normal.     Left Ear: External ear normal.  Eyes:     Conjunctiva/sclera: Conjunctivae normal.     Pupils: Pupils are equal, round, and reactive to light.  Neck:     Trachea: Phonation normal.  Cardiovascular:     Rate and Rhythm: Normal rate and regular rhythm.     Heart sounds: Normal heart sounds.  Pulmonary:     Effort: Pulmonary effort is normal.     Breath  sounds: Normal breath sounds.  Abdominal:     General: There is no distension.     Palpations: Abdomen is soft.     Tenderness: There is no abdominal tenderness.  Musculoskeletal:        General: Normal range of motion.     Cervical back: Normal range of motion and neck supple.     Comments: She is able to sit up on her own.  She is able to independently raise each of her extremities.  There is no pronator drift.  Skin:    General: Skin is warm and dry.  Neurological:     Mental Status: She is alert.     Cranial Nerves: No cranial nerve deficit.     Sensory: No sensory deficit.     Motor: No abnormal muscle tone.     Coordination: Coordination normal.     Comments: No dysarthria or aphasia.  Psychiatric:        Mood and Affect: Mood normal.        Behavior: Behavior normal.    ED Results / Procedures / Treatments   Labs (all labs ordered are listed, but only abnormal results are displayed) Labs Reviewed  BASIC METABOLIC PANEL - Abnormal; Notable for the following components:      Result Value   CO2 34 (*)    Glucose, Bld 176 (*)    BUN 30 (*)    Creatinine, Ser 1.91 (*)    Calcium 8.4 (*)    GFR, Estimated 24 (*)    All other components within normal limits  URINALYSIS, ROUTINE W REFLEX MICROSCOPIC - Abnormal; Notable for the following components:   Color, Urine STRAW (*)    Leukocytes,Ua LARGE (*)    WBC, UA >50 (*)    Bacteria, UA RARE (*)    All other components within normal limits  RESP PANEL BY RT-PCR (FLU A&B, COVID) ARPGX2  URINE CULTURE   CBC WITH DIFFERENTIAL/PLATELET    EKG None  Radiology No results found.  Procedures Procedures    Medications Ordered in ED Medications  sodium chloride 0.9 % bolus 500 mL (0 mLs Intravenous Stopped 12/20/21 1927)  cefTRIAXone (ROCEPHIN) 1 g in sodium chloride 0.9 % 100 mL IVPB (0 g Intravenous Stopped 12/20/21 2015)    ED Course/ Medical Decision Making/ A&P Clinical Course as of 12/20/21 2101  Thu Dec 20, 2021  1917 Patient's granddaughter in the room now.  She saw her grandmother earlier and states that now the patient looks better.  Patient is alert and comfortable and wants to go home.  She has received Rocephin and IV fluids. [EW]    Clinical Course User Index [EW] Daleen Bo, MD                           Medical Decision Making Patient presenting for sleepiness and weakness, which started suddenly after a nap today.  She is unable to give much history, daughter at bedside gives the history.  She was recently treated for UTI but the daughter indicates that the urine culture was negative.  She is recovering from recent fall with toe fractures, but is not taking sedating medication.  Patient's clinical exam is reassuring, she is mildly sleepy but does not present with any weakness.  Problems Addressed: Dehydration: acute illness or injury    Details: Incidental finding, likely related to malaise and recent injuries. Urinary tract infection without hematuria, site unspecified: acute illness or injury  Details: Probable UTI, catheter sample, culture sent.  Suspect UTI causing weakness and sleepiness.  Doubt sepsis or metabolic instability.  Amount and/or Complexity of Data Reviewed Independent Historian: caregiver    Details: Daughter and granddaughter at bedside. Labs: ordered.  Risk Decision regarding hospitalization. Risk Details: Patient with recent fall, was well earlier today but weak and sleepy after taking a nap.  No complications from recent toe fractures.   Apparent UTI, with mild dehydration, both improved after treatment with IV fluids and IV antibiotics.  Doubt sepsis.  Doubt pyelonephritis.  Stable for discharge with outpatient management.  No indication for further ED intervention, or hospitalization at this time.           Final Clinical Impression(s) / ED Diagnoses Final diagnoses:  Urinary tract infection without hematuria, site unspecified  Dehydration    Rx / DC Orders ED Discharge Orders          Ordered    cephALEXin (KEFLEX) 250 MG capsule  4 times daily        12/20/21 2049              Daleen Bo, MD 12/20/21 2101

## 2021-12-20 NOTE — ED Triage Notes (Signed)
EMS reports from home, Daughter states got up from nap with AMS beyond baseline from 0900. Hx of Dementia. Treated for UTI a week ago and fall two 2 days ago. ? ?BP 146/68 ?HR 86 ?RR 16 ?Sp02 98 RA ?CBG 181 ? ?

## 2021-12-21 ENCOUNTER — Ambulatory Visit: Payer: Self-pay

## 2021-12-21 ENCOUNTER — Encounter: Payer: Self-pay | Admitting: Family Medicine

## 2021-12-21 ENCOUNTER — Telehealth: Payer: Self-pay | Admitting: Family Medicine

## 2021-12-21 ENCOUNTER — Ambulatory Visit (INDEPENDENT_AMBULATORY_CARE_PROVIDER_SITE_OTHER): Payer: Medicare Other | Admitting: Family Medicine

## 2021-12-21 VITALS — BP 110/78 | Ht 64.0 in | Wt 156.0 lb

## 2021-12-21 DIAGNOSIS — M25461 Effusion, right knee: Secondary | ICD-10-CM | POA: Diagnosis not present

## 2021-12-21 DIAGNOSIS — S92514A Nondisplaced fracture of proximal phalanx of right lesser toe(s), initial encounter for closed fracture: Secondary | ICD-10-CM | POA: Insufficient documentation

## 2021-12-21 DIAGNOSIS — M25561 Pain in right knee: Secondary | ICD-10-CM

## 2021-12-21 DIAGNOSIS — M25462 Effusion, left knee: Secondary | ICD-10-CM | POA: Diagnosis not present

## 2021-12-21 DIAGNOSIS — S92512A Displaced fracture of proximal phalanx of left lesser toe(s), initial encounter for closed fracture: Secondary | ICD-10-CM | POA: Diagnosis not present

## 2021-12-21 MED ORDER — HYDROCODONE-ACETAMINOPHEN 5-325 MG PO TABS: 1.0000 | ORAL_TABLET | Freq: Three times a day (TID) | ORAL | 0 refills | Status: DC | PRN

## 2021-12-21 NOTE — Assessment & Plan Note (Signed)
Acutely occurring.  Likely related to her degenerative changes from her recent fall.  No signs of a fracture appreciated. ?-Counseled on home exercise therapy and supportive care. ?-Could consider injection or physical therapy or further imaging. ?

## 2021-12-21 NOTE — Assessment & Plan Note (Addendum)
Acutely occurring.  Initial injury on 3/7.  Occurred after a fall. ?-Counseled on home exercise therapy and supportive care. ?-Counseled on buddy taping. ?-Counseled on stopping the postop shoe. ?

## 2021-12-21 NOTE — Telephone Encounter (Signed)
Caller/Agency: [facility]   ?Callback Number: [phone] ?Requesting OT/PT/Skilled Nursing/Social Work/Speech Therapy: PT  ?Frequency: PT start of care on 03/13 ?
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
## 2021-12-21 NOTE — Telephone Encounter (Signed)
[facility] has been trying to reach pt and daughter to schedule orders Dr.Copland. They asked if a nurse could touch base with pt. Pt can call and schedule at [phone].  ?

## 2021-12-21 NOTE — Assessment & Plan Note (Addendum)
Acutely occurring.  Injury occurred on 3/7 after a fall. ?-Counseled on home exercise therapy and supportive care. ?-Counseled on buddy taping. ?-[place]. ?-Can repeat imaging if necessary. ?

## 2021-12-21 NOTE — Patient Instructions (Signed)
Nice to meet you ?Please try buddy taping  ?Please try ice as needed  ?Please use the pain medicine as needed   ?Please send me a message in MyChart with any questions or updates.  ?Please see me back in 3-4 weeks. We can do a virtual visit.  ? ?--Dr. Raeford Razor ? ?
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
## 2021-12-24 ENCOUNTER — Ambulatory Visit (INDEPENDENT_AMBULATORY_CARE_PROVIDER_SITE_OTHER): Payer: Medicare Other | Admitting: Family Medicine

## 2021-12-24 VITALS — BP 100/60 | HR 84 | Resp 18

## 2021-12-24 DIAGNOSIS — F22 Delusional disorders: Secondary | ICD-10-CM | POA: Diagnosis not present

## 2021-12-24 DIAGNOSIS — E1122 Type 2 diabetes mellitus with diabetic chronic kidney disease: Secondary | ICD-10-CM | POA: Diagnosis not present

## 2021-12-24 DIAGNOSIS — N1832 Chronic kidney disease, stage 3b: Secondary | ICD-10-CM | POA: Diagnosis not present

## 2021-12-24 DIAGNOSIS — G250 Essential tremor: Secondary | ICD-10-CM | POA: Diagnosis not present

## 2021-12-24 DIAGNOSIS — R296 Repeated falls: Secondary | ICD-10-CM

## 2021-12-24 DIAGNOSIS — N3000 Acute cystitis without hematuria: Secondary | ICD-10-CM

## 2021-12-24 DIAGNOSIS — K219 Gastro-esophageal reflux disease without esophagitis: Secondary | ICD-10-CM | POA: Diagnosis not present

## 2021-12-24 DIAGNOSIS — J45909 Unspecified asthma, uncomplicated: Secondary | ICD-10-CM | POA: Diagnosis not present

## 2021-12-24 DIAGNOSIS — I4819 Other persistent atrial fibrillation: Secondary | ICD-10-CM | POA: Diagnosis not present

## 2021-12-24 DIAGNOSIS — G2581 Restless legs syndrome: Secondary | ICD-10-CM | POA: Diagnosis not present

## 2021-12-24 DIAGNOSIS — M8580 Other specified disorders of bone density and structure, unspecified site: Secondary | ICD-10-CM | POA: Diagnosis not present

## 2021-12-24 DIAGNOSIS — N289 Disorder of kidney and ureter, unspecified: Secondary | ICD-10-CM | POA: Diagnosis not present

## 2021-12-24 DIAGNOSIS — Z7901 Long term (current) use of anticoagulants: Secondary | ICD-10-CM | POA: Diagnosis not present

## 2021-12-24 DIAGNOSIS — D631 Anemia in chronic kidney disease: Secondary | ICD-10-CM | POA: Diagnosis not present

## 2021-12-24 DIAGNOSIS — Z7951 Long term (current) use of inhaled steroids: Secondary | ICD-10-CM | POA: Diagnosis not present

## 2021-12-24 DIAGNOSIS — M150 Primary generalized (osteo)arthritis: Secondary | ICD-10-CM | POA: Diagnosis not present

## 2021-12-24 DIAGNOSIS — G43909 Migraine, unspecified, not intractable, without status migrainosus: Secondary | ICD-10-CM | POA: Diagnosis not present

## 2021-12-24 DIAGNOSIS — F419 Anxiety disorder, unspecified: Secondary | ICD-10-CM | POA: Diagnosis not present

## 2021-12-24 DIAGNOSIS — E538 Deficiency of other specified B group vitamins: Secondary | ICD-10-CM | POA: Diagnosis not present

## 2021-12-24 DIAGNOSIS — I130 Hypertensive heart and chronic kidney disease with heart failure and stage 1 through stage 4 chronic kidney disease, or unspecified chronic kidney disease: Secondary | ICD-10-CM | POA: Diagnosis not present

## 2021-12-24 DIAGNOSIS — N390 Urinary tract infection, site not specified: Secondary | ICD-10-CM | POA: Diagnosis not present

## 2021-12-24 DIAGNOSIS — S92511D Displaced fracture of proximal phalanx of right lesser toe(s), subsequent encounter for fracture with routine healing: Secondary | ICD-10-CM | POA: Diagnosis not present

## 2021-12-24 DIAGNOSIS — Z794 Long term (current) use of insulin: Secondary | ICD-10-CM | POA: Diagnosis not present

## 2021-12-24 DIAGNOSIS — S92516D Nondisplaced fracture of proximal phalanx of unspecified lesser toe(s), subsequent encounter for fracture with routine healing: Secondary | ICD-10-CM

## 2021-12-24 DIAGNOSIS — Z792 Long term (current) use of antibiotics: Secondary | ICD-10-CM | POA: Diagnosis not present

## 2021-12-24 DIAGNOSIS — I5023 Acute on chronic systolic (congestive) heart failure: Secondary | ICD-10-CM | POA: Diagnosis not present

## 2021-12-24 DIAGNOSIS — R54 Age-related physical debility: Secondary | ICD-10-CM

## 2021-12-24 DIAGNOSIS — E7800 Pure hypercholesterolemia, unspecified: Secondary | ICD-10-CM | POA: Diagnosis not present

## 2021-12-24 DIAGNOSIS — Z79891 Long term (current) use of opiate analgesic: Secondary | ICD-10-CM | POA: Diagnosis not present

## 2021-12-24 DIAGNOSIS — S92512D Displaced fracture of proximal phalanx of left lesser toe(s), subsequent encounter for fracture with routine healing: Secondary | ICD-10-CM | POA: Diagnosis not present

## 2021-12-24 DIAGNOSIS — E1142 Type 2 diabetes mellitus with diabetic polyneuropathy: Secondary | ICD-10-CM | POA: Diagnosis not present

## 2021-12-24 LAB — URINE CULTURE: Culture: 10000 — AB

## 2021-12-24 MED ORDER — SULFAMETHOXAZOLE-TRIMETHOPRIM 400-80 MG PO TABS: 1.0000 | ORAL_TABLET | Freq: Two times a day (BID) | ORAL | 0 refills | Status: DC

## 2021-12-24 NOTE — Telephone Encounter (Signed)
They have contacted pt.  ?

## 2021-12-24 NOTE — Telephone Encounter (Signed)
Melissa [name] calling to request 2X3 // 1X6 for PT  ? ?Verbal approval for social work assessment. Pt's daughter is currently her care giver, pt has broken toes and needs a little more help  ? ?Please advise   ?
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
## 2021-12-24 NOTE — Telephone Encounter (Signed)
Verbal orders given  

## 2021-12-24 NOTE — Telephone Encounter (Signed)
Melissa [name] [facility] - [phone]  ? ? ?See note below  ?

## 2021-12-24 NOTE — Patient Instructions (Addendum)
Let's try increasing her Mirapex back to 0.5 mg daily- please let me know if this is helpful.  We can try going up higher if we need to  ? ?[facility] might be a good resource ? ?Address: [address] [place], [place], [place] [address] ?Phone: [phone] ? ?Also [facility] may be a good option for you ? ? ?We will change antibiotic to sulfa twice a day for 5 days- stop keflex  ?

## 2021-12-25 ENCOUNTER — Telehealth: Payer: Self-pay | Admitting: Emergency Medicine

## 2021-12-25 NOTE — Telephone Encounter (Signed)
Post ED Visit - Positive Culture Follow-up ? ?Culture report reviewed by antimicrobial stewardship pharmacist: ?[place] Team ?'[]'$  [name], Pharm.D. ?'[]'$  [name], Pharm.D., BCPS AQ-ID ?'[]'$  [name], Pharm.D., BCPS ?'[x]'$  [name], Pharm.D., BCPS ?'[]'$  Murdock, Pharm.D., BCPS, AAHIVP ?'[]'$  [name], Pharm.D., BCPS, AAHIVP ?'[]'$  [name], PharmD, BCPS ?'[]'$  [name], PharmD, BCPS ?'[]'$  [name], PharmD, BCPS ?'[]'$  [name], PharmD ?'[]'$  [name], PharmD, BCPS ?'[]'$  Albertina Parr, PharmD ? ?[place] Team ?'[]'$  [name], PharmD ?'[]'$  [name], PharmD ?'[]'$  [name], PharmD ?'[]'$  [name], Rph ?'[]'$  [name], PharmD ?'[]'$  [name], PharmD ?'[]'$  [name], PharmD ?'[]'$  [name], PharmD ?'[]'$  [name], PharmD ?'[]'$  [name], PharmD ?'[]'$  [name], PharmD ?'[]'$  [name], PharmD ?'[]'$  Reuel Boom, PharmD ? ? ?Positive urine culture ?Treated with bactrim, organism sensitive to the same and no further patient follow-up is required at this time. ? ?Hazle Nordmann ?12/25/2021, 10:05 AM ?  ?

## 2021-12-26 ENCOUNTER — Encounter: Payer: Self-pay | Admitting: Family Medicine

## 2021-12-26 DIAGNOSIS — R54 Age-related physical debility: Secondary | ICD-10-CM

## 2021-12-26 DIAGNOSIS — R296 Repeated falls: Secondary | ICD-10-CM

## 2021-12-27 ENCOUNTER — Telehealth: Payer: Self-pay

## 2021-12-27 DIAGNOSIS — K219 Gastro-esophageal reflux disease without esophagitis: Secondary | ICD-10-CM | POA: Diagnosis not present

## 2021-12-27 DIAGNOSIS — I4819 Other persistent atrial fibrillation: Secondary | ICD-10-CM | POA: Diagnosis not present

## 2021-12-27 DIAGNOSIS — E538 Deficiency of other specified B group vitamins: Secondary | ICD-10-CM | POA: Diagnosis not present

## 2021-12-27 DIAGNOSIS — F419 Anxiety disorder, unspecified: Secondary | ICD-10-CM | POA: Diagnosis not present

## 2021-12-27 DIAGNOSIS — S92512D Displaced fracture of proximal phalanx of left lesser toe(s), subsequent encounter for fracture with routine healing: Secondary | ICD-10-CM | POA: Diagnosis not present

## 2021-12-27 DIAGNOSIS — S92511D Displaced fracture of proximal phalanx of right lesser toe(s), subsequent encounter for fracture with routine healing: Secondary | ICD-10-CM | POA: Diagnosis not present

## 2021-12-27 NOTE — Telephone Encounter (Signed)
Melissa [name] from [facility]- wanted to report a fall today, no injuries.  ?

## 2021-12-27 NOTE — Telephone Encounter (Signed)
Attempted to contact patient and patient's daughter Melissa [name] to schedule a Palliative Care consult appointment. No answer left a message to return call.  ? ?

## 2021-12-31 ENCOUNTER — Emergency Department (HOSPITAL_COMMUNITY)
Admission: EM | Admit: 2021-12-31 | Discharge: 2021-12-31 | Disposition: A | Discharge status: Home / Self Care | Payer: Medicare Other | Attending: Emergency Medicine | Admitting: Emergency Medicine

## 2021-12-31 ENCOUNTER — Emergency Department (HOSPITAL_COMMUNITY): Payer: Medicare Other

## 2021-12-31 ENCOUNTER — Telehealth: Payer: Self-pay | Admitting: *Deleted

## 2021-12-31 DIAGNOSIS — I480 Paroxysmal atrial fibrillation: Secondary | ICD-10-CM | POA: Diagnosis not present

## 2021-12-31 DIAGNOSIS — S92511D Displaced fracture of proximal phalanx of right lesser toe(s), subsequent encounter for fracture with routine healing: Secondary | ICD-10-CM | POA: Diagnosis not present

## 2021-12-31 DIAGNOSIS — E538 Deficiency of other specified B group vitamins: Secondary | ICD-10-CM | POA: Diagnosis not present

## 2021-12-31 DIAGNOSIS — R531 Weakness: Secondary | ICD-10-CM

## 2021-12-31 DIAGNOSIS — I499 Cardiac arrhythmia, unspecified: Secondary | ICD-10-CM | POA: Diagnosis not present

## 2021-12-31 DIAGNOSIS — J189 Pneumonia, unspecified organism: Secondary | ICD-10-CM | POA: Diagnosis not present

## 2021-12-31 DIAGNOSIS — Z794 Long term (current) use of insulin: Secondary | ICD-10-CM | POA: Diagnosis not present

## 2021-12-31 DIAGNOSIS — Z7901 Long term (current) use of anticoagulants: Secondary | ICD-10-CM | POA: Insufficient documentation

## 2021-12-31 DIAGNOSIS — J9811 Atelectasis: Secondary | ICD-10-CM | POA: Diagnosis not present

## 2021-12-31 DIAGNOSIS — Z743 Need for continuous supervision: Secondary | ICD-10-CM | POA: Diagnosis not present

## 2021-12-31 DIAGNOSIS — S92512D Displaced fracture of proximal phalanx of left lesser toe(s), subsequent encounter for fracture with routine healing: Secondary | ICD-10-CM | POA: Diagnosis not present

## 2021-12-31 DIAGNOSIS — Z20822 Contact with and (suspected) exposure to covid-19: Secondary | ICD-10-CM | POA: Insufficient documentation

## 2021-12-31 DIAGNOSIS — I509 Heart failure, unspecified: Secondary | ICD-10-CM | POA: Diagnosis not present

## 2021-12-31 DIAGNOSIS — Z79899 Other long term (current) drug therapy: Secondary | ICD-10-CM | POA: Diagnosis not present

## 2021-12-31 DIAGNOSIS — K219 Gastro-esophageal reflux disease without esophagitis: Secondary | ICD-10-CM | POA: Diagnosis not present

## 2021-12-31 DIAGNOSIS — I4819 Other persistent atrial fibrillation: Secondary | ICD-10-CM | POA: Diagnosis not present

## 2021-12-31 DIAGNOSIS — N189 Chronic kidney disease, unspecified: Secondary | ICD-10-CM | POA: Insufficient documentation

## 2021-12-31 DIAGNOSIS — E1122 Type 2 diabetes mellitus with diabetic chronic kidney disease: Secondary | ICD-10-CM | POA: Insufficient documentation

## 2021-12-31 DIAGNOSIS — K5730 Diverticulosis of large intestine without perforation or abscess without bleeding: Secondary | ICD-10-CM | POA: Diagnosis not present

## 2021-12-31 DIAGNOSIS — I130 Hypertensive heart and chronic kidney disease with heart failure and stage 1 through stage 4 chronic kidney disease, or unspecified chronic kidney disease: Secondary | ICD-10-CM | POA: Diagnosis not present

## 2021-12-31 DIAGNOSIS — E039 Hypothyroidism, unspecified: Secondary | ICD-10-CM | POA: Diagnosis not present

## 2021-12-31 DIAGNOSIS — F419 Anxiety disorder, unspecified: Secondary | ICD-10-CM | POA: Diagnosis not present

## 2021-12-31 DIAGNOSIS — R1030 Lower abdominal pain, unspecified: Secondary | ICD-10-CM | POA: Insufficient documentation

## 2021-12-31 DIAGNOSIS — F0390 Unspecified dementia without behavioral disturbance: Secondary | ICD-10-CM | POA: Diagnosis not present

## 2021-12-31 DIAGNOSIS — R739 Hyperglycemia, unspecified: Secondary | ICD-10-CM | POA: Diagnosis not present

## 2021-12-31 LAB — URINALYSIS, ROUTINE W REFLEX MICROSCOPIC
Bilirubin Urine: NEGATIVE
Glucose, UA: NEGATIVE mg/dL
Hgb urine dipstick: NEGATIVE
Ketones, ur: NEGATIVE mg/dL
Nitrite: NEGATIVE
Protein, ur: NEGATIVE mg/dL
Specific Gravity, Urine: 1.011 (ref 1.005–1.030)
pH: 6 (ref 5.0–8.0)

## 2021-12-31 LAB — CBC WITH DIFFERENTIAL/PLATELET
Abs Immature Granulocytes: 0.1 10*3/uL — ABNORMAL HIGH (ref 0.00–0.07)
Basophils Absolute: 0.1 10*3/uL (ref 0.0–0.1)
Basophils Relative: 1 %
Eosinophils Absolute: 0.3 10*3/uL (ref 0.0–0.5)
Eosinophils Relative: 4 %
HCT: 42.2 % (ref 36.0–46.0)
Hemoglobin: 13.2 g/dL (ref 12.0–15.0)
Immature Granulocytes: 1 %
Lymphocytes Relative: 27 %
Lymphs Abs: 2 10*3/uL (ref 0.7–4.0)
MCH: 27.8 pg (ref 26.0–34.0)
MCHC: 31.3 g/dL (ref 30.0–36.0)
MCV: 89 fL (ref 80.0–100.0)
Monocytes Absolute: 0.6 10*3/uL (ref 0.1–1.0)
Monocytes Relative: 7 %
Neutro Abs: 4.4 10*3/uL (ref 1.7–7.7)
Neutrophils Relative %: 60 %
Platelets: 246 10*3/uL (ref 150–400)
RBC: 4.74 MIL/uL (ref 3.87–5.11)
RDW: 12.3 % (ref 11.5–15.5)
WBC: 7.5 10*3/uL (ref 4.0–10.5)
nRBC: 0 % (ref 0.0–0.2)

## 2021-12-31 LAB — RESP PANEL BY RT-PCR (FLU A&B, COVID) ARPGX2
Influenza A by PCR: NEGATIVE
Influenza B by PCR: NEGATIVE
SARS Coronavirus 2 by RT PCR: NEGATIVE

## 2021-12-31 LAB — COMPREHENSIVE METABOLIC PANEL
ALT: 13 U/L (ref 0–44)
AST: 16 U/L (ref 15–41)
Albumin: 3.7 g/dL (ref 3.5–5.0)
Alkaline Phosphatase: 88 U/L (ref 38–126)
Anion gap: 6 (ref 5–15)
BUN: 27 mg/dL — ABNORMAL HIGH (ref 8–23)
CO2: 33 mmol/L — ABNORMAL HIGH (ref 22–32)
Calcium: 8.7 mg/dL — ABNORMAL LOW (ref 8.9–10.3)
Chloride: 95 mmol/L — ABNORMAL LOW (ref 98–111)
Creatinine, Ser: 1.93 mg/dL — ABNORMAL HIGH (ref 0.44–1.00)
GFR, Estimated: 24 mL/min — ABNORMAL LOW (ref >60)
Glucose, Bld: 177 mg/dL — ABNORMAL HIGH (ref 70–99)
Potassium: 4.4 mmol/L (ref 3.5–5.1)
Sodium: 134 mmol/L — ABNORMAL LOW (ref 135–145)
Total Bilirubin: 0.3 mg/dL (ref 0.3–1.2)
Total Protein: 7.5 g/dL (ref 6.5–8.1)

## 2021-12-31 LAB — TROPONIN I (HIGH SENSITIVITY)
Troponin I (High Sensitivity): 4 ng/L (ref <18)
Troponin I (High Sensitivity): 4 ng/L (ref <18)

## 2021-12-31 LAB — LACTIC ACID, PLASMA
Lactic Acid, Venous: 1.4 mmol/L (ref 0.5–1.9)
Lactic Acid, Venous: 1 mmol/L (ref 0.5–1.9)

## 2021-12-31 LAB — MAGNESIUM: Magnesium: 1.6 mg/dL — ABNORMAL LOW (ref 1.7–2.4)

## 2021-12-31 LAB — PROTIME-INR
INR: 1.1 (ref 0.8–1.2)
Prothrombin Time: 14.1 seconds (ref 11.4–15.2)

## 2021-12-31 MED ORDER — LACTATED RINGERS IV BOLUS (SEPSIS)
1000.0000 mL | Freq: Once | INTRAVENOUS | Status: AC
  Administered 2021-12-31: 1000 mL via INTRAVENOUS

## 2021-12-31 MED ORDER — AMOXICILLIN-POT CLAVULANATE 875-125 MG PO TABS: 1.0000 | ORAL_TABLET | Freq: Two times a day (BID) | ORAL | 0 refills | Status: DC

## 2021-12-31 MED ORDER — AMOXICILLIN-POT CLAVULANATE 875-125 MG PO TABS
1.0000 | ORAL_TABLET | Freq: Once | ORAL | Status: AC
  Administered 2021-12-31: 1 via ORAL
  Filled 2021-12-31: qty 1

## 2021-12-31 MED ORDER — MAGNESIUM SULFATE 2 GM/50ML IV SOLN
2.0000 g | Freq: Once | INTRAVENOUS | Status: AC
  Administered 2021-12-31: 2 g via INTRAVENOUS
  Filled 2021-12-31: qty 50

## 2021-12-31 NOTE — ED Notes (Signed)
[facility] called  

## 2021-12-31 NOTE — Telephone Encounter (Signed)
Pt is at the ER now.  ?

## 2021-12-31 NOTE — Telephone Encounter (Signed)
Thanks- so they are taking her to the ER?  Do you need me to take any action?

## 2021-12-31 NOTE — ED Provider Notes (Signed)
?  Physical Exam  ?BP 124/71   Pulse 98   Temp 97.7 ?F (36.5 ?C)   Resp 13   Ht '5\' 4"'$  (1.626 m)   Wt 70.8 kg   SpO2 94%   BMI 26.78 kg/m?  ? ?Physical Exam ? ?Procedures  ?Procedures ? ?ED Course / MDM  ?  ?Medical Decision Making ?Care assumed at 4 pm.  Patient is here for generalized weakness.  Patient had a CT head several weeks ago that showed general atrophy.  Patient essentially has negative work-up in the ED.  In particular, she had a CT abdomen pelvis that showed possible pneumonia but her white blood cell count and her lactate are normal.  Her urinalysis are normal.  Signout pending physical therapy consult as well as social work consult ? ?7:20 PM ?Social work came to talk to patient and offered SNF versus palliative care.  I also discussed with the daughter who is at bedside.  Daughter wants her comfortable at home.  Social work able to arrange for raised commode and patient already has a wheelchair.  We will give short course of Augmentin for possible pneumonia.  ? ?Problems Addressed: ?Community acquired pneumonia, unspecified laterality: acute illness or injury ?Weakness: acute illness or injury ? ?Amount and/or Complexity of Data Reviewed ?Independent Historian: EMS ?External Data Reviewed: notes. ?Labs: ordered. Decision-making details documented in ED Course. ?Radiology: ordered. ?ECG/medicine tests: ordered and independent interpretation performed. Decision-making details documented in ED Course. ? ?Risk ?Prescription drug management. ? ? ? ? ? ? ? ?  ?Drenda Freeze, MD ?12/31/21 1923 ? ?

## 2021-12-31 NOTE — ED Triage Notes (Signed)
Ems brings pt in from home for generalized weakness. States the weakness has been getting worse over the past 3 weeks. ?

## 2021-12-31 NOTE — ED Notes (Signed)
Pt given coke and sandwich  ?

## 2021-12-31 NOTE — Progress Notes (Addendum)
.Transition of Care [facility]) - Emergency Department Mini Assessment ? ? ?Patient Details  ?Name: Melissa [name] ?MRN: [id number] ?Date of Birth: 12-08-1927 ? ?Transition of Care (TOC) CM/SW Contact:    ?Roseanne Kaufman, RN ?Phone Number: ?12/31/2021, 6:05 PM ? ? ?Clinical Narrative: ? ?Patient presented to [facility] ED for generalized weakness. RNCM received TOC consult for SNF vs HHC.  ? ?ED Mini Assessment: ? RNCM spoke with patient and daughter [name] (ph# [phone]) at bedside. [name] reports her mother lives with her and she gets help from family on the weekends. Patient is already set up [facility], daughter feels that she needs more assistance at home. Patient's daughter reports the family does not want her to go to a SNF, their preference is to keep her at home with some assistance. Patient has had recent fall and daughter [name] was unable to get her up off the floor until another family member came to assist.  [name] reports she received a call from [place] regarding a palliative referral however has not been able to speak with Lexine Baton. ?This RNCM called after hours phone line of [facility] awaiting a call back to determine the status of the referral. Patient has the following DME at home: wheelchair, 2 walkers. [name] reports patient needs a raised toilet seat with handles, due to patient shakes a lot. [name] reports a doctor once told them she has Lewy body Dementia.   ? ?This RNCM spoke with Mariann Laster at [facility] who took referral and palliative care will contact the family in the am, due to time of day. ? ?EDP notified.  ?TOC will continue to follow ? ?Patient Contact and Communications ?  ? Fransisco Hertz ( daughter) [phone] ?[facility] [place]: [phone] ? [facility] Lexine Baton): [phone] option 3 ?  ?  ?Admission diagnosis:  uti;weakness ?Patient Active Problem List  ? Diagnosis Date Noted  ? Bilateral knee effusions 12/21/2021  ? Closed displaced fracture of proximal phalanx of lesser toe of left foot 12/21/2021   ? Closed nondisplaced fracture of proximal phalanx of lesser toe of right foot 12/21/2021  ? Visual hallucinations 10/17/2021  ? Tremor of both hands 10/17/2021  ? 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 Fall 08/24/2021  ? Vitamin B12 deficiency 08/16/2021  ? Acute lower UTI 08/15/2021  ? Generalized weakness 08/14/2021  ? Delusional disorder ([place]) 07/13/2021  ? Persistent atrial fibrillation ([place])   ? Acute on chronic systolic CHF (congestive heart failure) ([place]) 04/12/2021  ? Atrial fibrillation with rapid ventricular response ([place]) PMH PAF 04/06/2021  ? Secondary hypercoagulable state ([place]) 04/06/2021  ? Osteopenia 08/29/2020  ? Healthcare maintenance 07/06/2020  ? Hypokalemia 02/25/2020  ? Urgency of urination 02/25/2020  ? Chest pain 02/08/2020  ? Bronchiectasis without complication ([place]) 83/15/1761  ? Obstructive lung disease ([place]) 01/28/2020  ? Atrial fibrillation with RVR ([place])   ? Pneumonia 07/11/2019  ? Abnormal chest CT 04/07/2018  ? RLS (restless legs syndrome) 08/05/2017  ? Closed fracture of right proximal humerus 06/05/2017  ? High cholesterol 06/05/2017  ? Hypertension 06/05/2017  ? NICM (nonischemic cardiomyopathy) ([place]) 06/05/2017  ? Anemia 06/05/2017  ? Dyspnea 10/20/2016  ? Chronic systolic heart failure ([place]) 10/19/2016  ? Symptomatic cholelithiasis 01/24/2016  ? History of CVA- old cerebellar infarcts noted on MRI 11/16/2015  ? Chest pain with moderate risk of acute coronary syndrome 11/14/2015  ? Left upper extremity numbness   ? Paresthesia 08/22/2015  ? Tremor, essential 08/22/2015  ? Paroxysmal atrial fibrillation ([place]) 03/06/2015  ? Chronic anticoagulation 03/06/2015  ?  Orthostatic hypotension 03/06/2015  ? Essential hypertension 04/19/2014  ? CKD stage IIIb ([place]) 04/19/2014  ? Hypothyroidism, acquired 06/15/2013  ? Normocytic anemia 06/10/2013  ? Type 2 diabetes mellitus with diabetic neuropathy, with long-term current use of insulin ([place]) 09/22/2011  ? HLD (hyperlipidemia)  09/22/2011  ? ?PCP:  Darreld Mclean, MD ?Pharmacy:   ?[facility] [place] ?[address] ?[place] [address] ?Phone: [phone] Fax: [phone] ? ?[facility] [place] [address] ?[address] ?[place] [address] ?Phone: [phone] Fax: [phone] ?  ?

## 2021-12-31 NOTE — TOC Progression Note (Signed)
Transition of Care (TOC) - Progression Note  ? ? ?Patient Details  ?Name: Melissa [name] ?MRN: [id number] ?Date of Birth: [birth year] ? ?Transition of Care (TOC) CM/SW Contact  ?Roseanne Kaufman, RN ?Phone Number: ?12/31/2021, 6:56 PM ? ?Clinical Narrative:    ? ?RNCM contacted Zack with [employer] for raised toilet seat.  ?This RNCM spoke with patient and daughter at bedside to advise palliative referral has been made to [facility] and request for raised toilet seat has been made to [employer]. Patient's daughter is questioning patient's lab results this RNCM advised the EDP will review and speak with them re: current POC related to lab values.  ? ?  ?  ? ?Expected Discharge Plan and Services ?  Discharge home with palliative care and DME ?  ?  ? ?Social Determinants of Health (SDOH) Interventions ?  ? ?Readmission Risk Interventions ?Readmission Risk Prevention Plan 08/16/2021 04/16/2021  ?Transportation Screening Complete Complete  ?PCP or Specialist Appt within 3-5 Days - Complete  ?[place] or Home Care Consult - Complete  ?Social Work Consult for [place] Planning/Counseling - Complete  ?Palliative Care Screening - Not Applicable  ?Medication Review Press photographer) Complete Complete  ?PCP or Specialist appointment within 3-5 days of discharge Complete -  ?[place] or Home Care Consult Complete -  ?SW Recovery Care/Counseling Consult Complete -  ?Some recent data might be hidden  ? ? ?

## 2021-12-31 NOTE — ED Provider Notes (Signed)
?[facility] DEPT ?Provider Note ? ? ?CSN: [id number] ?Arrival date & time: 12/31/21  1219 ? ?  ? ?History ? ?Chief Complaint  ?Patient presents with  ? Weakness  ? ? ?Melissa [name] is a 88 y.o. female. ? ? ?Weakness ?Patient presents for generalized weakness, worsening over the past 3 weeks.  Medical history includes T2DM, HLD, anemia, hypothyroidism, HTN, CKD, paroxysmal atrial fibrillation, CVA, CHF.  ? ?History per daughter, who patient lives with: Patient has had a progression of symptoms over the past 3 weeks.  This began after the patient had injuries to her toes.  The symptoms include gait instability, generalized weakness and intermittent episodes of tremulousness.  This causes instability with transfers and ambulation.  Patient requires assistance with transfers.  She sleeps in a recliner.  At times, she attempted to get up from the chair on her own.  This typically results in a fall.  When she does fall, patient's daughter, who is the only one at home with her, has difficulty getting her back to a chair.  She does have a brother and a son that she can call to come over and help lift the patient back up.  Patient is believed to have Lewy body dementia.  At baseline, she is able to engage in small conversation.  Patient has not endorsed any recent pain.  Patient has been recently treated for UTI.  Patient's daughter has been in discussion about home hospice.  She also feels that patient may benefit from skilled nursing rehab facility. ?  ? ?Home Medications ?Prior to Admission medications   ?Medication Sig Start Date End Date Taking? Authorizing Provider  ?acetaminophen (TYLENOL) 500 MG tablet Take 500-1,000 mg by mouth every 6 (six) hours as needed for headache (pain.).   Yes [provider]  ?amoxicillin-clavulanate (AUGMENTIN) 875-125 MG tablet Take 1 tablet by mouth every 12 (twelve) hours. 12/31/21  Yes Drenda Freeze, MD  ?atorvastatin (LIPITOR) 40 MG tablet  TAKE 1 TABLET DAILY ?Patient taking differently: Take 40 mg by mouth daily with lunch. 01/11/21  Yes Copland, Gay Filler, MD  ?cetirizine (ZYRTEC) 10 MG tablet Take 10 mg by mouth in the morning.   Yes [provider]  ?Cholecalciferol (VITAMIN D3) 1000 UNITS CAPS Take 1,000 Units by mouth every evening.   Yes [provider]  ?cyanocobalamin (,VITAMIN B-12,) 1000 MCG/ML injection Inject 1,000 mcg into the muscle once as needed (low B12 levels).   Yes [provider]  ?ELIQUIS 2.5 MG TABS tablet TAKE 1 TABLET TWICE A DAY ?Patient taking differently: Take 2.5 mg by mouth See admin instructions. Take one tablet (2.5 mg) by mouth twice daily - with lunch and at bedtime 06/14/21  Yes [name], MD  ?Fe Fum-FePoly-Vit C-Vit B3 (INTEGRA) 62.5-62.5-40-3 MG CAPS TAKE 1 CAPSULE DAILY ?Patient taking differently: Take 1 capsule by mouth daily with lunch. 07/16/21  Yes Copland, Gay Filler, MD  ?FLUoxetine (PROZAC) 20 MG capsule Take 1 capsule (20 mg total) by mouth daily. ?Patient taking differently: Take 20 mg by mouth daily with breakfast. 10/17/21  Yes Dohmeier, Asencion Partridge, MD  ?formoterol (PERFOROMIST) 20 MCG/2ML nebulizer solution Take 2 mLs (20 mcg total) by nebulization 2 (two) times daily. 03/06/20  Yes Lauraine Rinne, NP  ?furosemide (LASIX) 40 MG tablet TAKE 1 TABLET DAILY ?Patient taking differently: Take 40 mg by mouth daily with breakfast. 07/16/21  Yes Copland, Gay Filler, MD  ?HUMALOG KWIKPEN 100 UNIT/ML KwikPen Inject 6 units under the  skin daily with supper. ?Patient taking differently: 6 Units daily with supper. 02/24/20  Yes [name], MD  ?HYDROcodone-acetaminophen (NORCO/VICODIN) 5-325 MG tablet Take 1 tablet by mouth every 8 (eight) hours as needed. ?Patient taking differently: Take 0.5-1 tablets by mouth every 8 (eight) hours as needed (pain). 12/21/21  Yes Rosemarie Ax, MD  ?insulin detemir (LEVEMIR FLEXTOUCH) 100 UNIT/ML FlexPen Inject 25 Units into the skin at bedtime. 05/23/21   Yes [name], MD  ?levothyroxine (SYNTHROID) 50 MCG tablet TAKE 1 TABLET DAILY BEFORE BREAKFAST ?Patient taking differently: Take 50 mcg by mouth daily before breakfast. 06/27/21  Yes Copland, Gay Filler, MD  ?metoprolol tartrate (LOPRESSOR) 50 MG tablet TAKE 1.5 TABLETS BY MOUTH 2 TIMES DAILY. ?Patient taking differently: Take 75 mg by mouth See admin instructions. Take 1 1/2 tablets (75 mg) by mouth twice daily - with lunch and at bedtime 11/12/21  Yes Skains, Thana Farr, MD  ?Multiple Vitamins-Minerals (ICAPS AREDS 2) CAPS Take 1 capsule by mouth 2 (two) times daily with a meal.   Yes [provider]  ?Omega-3 Fatty Acids (FISH OIL) 1000 MG CAPS Take 1,000 mg by mouth daily with supper.   Yes [provider]  ?pantoprazole (PROTONIX) 40 MG tablet Take 1 tablet (40 mg total) by mouth 2 (two) times daily. Continue taking once a day after finishing the course of twice a day for  2 months ?Patient taking differently: Take 40 mg by mouth 2 (two) times daily with a meal. 04/10/21 12/31/21 Yes Adhikari, Tamsen Meek, MD  ?pramipexole (MIRAPEX) 0.25 MG tablet Take 1 tablet (0.25 mg total) by mouth daily with supper. Take 1 tablet at 5 pm ?Patient taking differently: Take 0.5 mg by mouth at bedtime. 10/17/21  Yes Dohmeier, Asencion Partridge, MD  ?pregabalin (LYRICA) 50 MG capsule Take 2 capsules (100 mg total) by mouth at bedtime. 10/05/21  Yes Copland, Gay Filler, MD  ?QUEtiapine (SEROQUEL) 50 MG tablet Take 2 tablets (100 mg total) by mouth at bedtime. 10/17/21  Yes Dohmeier, Asencion Partridge, MD  ?Semaglutide,0.25 or 0.5MG/DOS, (OZEMPIC, 0.25 OR 0.5 MG/DOSE,) 2 MG/1.5ML SOPN Inject 0.5 mg into the skin every Monday. 10/08/21  Yes Copland, Gay Filler, MD  ?senna (SENOKOT) 8.6 MG TABS tablet Take 2 tablets by mouth daily as needed (constipation).   Yes [provider]  ?sucralfate (CARAFATE) 1 GM/10ML suspension Take 10 mLs (1 g total) by mouth 2 (two) times daily. Use as needed for stomach pain ?Patient taking differently: Take 1  g by mouth 2 (two) times daily as needed (stomach pain). 05/08/21  Yes Copland, Gay Filler, MD  ?traZODone (DESYREL) 100 MG tablet Take 1 tablet (100 mg total) by mouth at bedtime. 10/17/21  Yes Dohmeier, Asencion Partridge, MD  ?vitamin B-12 (CYANOCOBALAMIN) 1000 MCG tablet Take 1 tablet (1,000 mcg total) by mouth daily. ?Patient taking differently: Take 1,000 mcg by mouth daily with breakfast. 11/21/21  Yes Copland, Gay Filler, MD  ?blood glucose meter kit and supplies KIT Dispense based on patient and insurance preference. Use up to four times daily as directed. 01/13/21   Copland, Gay Filler, MD  ?Blood Glucose Monitoring Suppl DEVI Dispense one meter to use and monitor glucose as needed 12/18/20   Copland, Gay Filler, MD  ?cephALEXin (KEFLEX) 250 MG capsule Take 1 capsule (250 mg total) by mouth 4 (four) times daily. ?Patient not taking: Reported on 12/31/2021 12/20/21   Daleen Bo, MD  ?[facility] LANCETS 07P MISC Use to check blood sugar 4 times per day dx  code E11.29 01/19/18   [name], MD  ?Roma Schanz test strip USE AS INSTRUCTED TO CHECK BLOOD SUGAR 4 TIMES PER DAY 01/11/21   [name], MD  ?polyethylene glycol (MIRALAX / GLYCOLAX) 17 g packet Take 17 g by mouth daily as needed (constipation.). ?Patient not taking: Reported on 12/31/2021 05/23/21   [name], MD  ?Respiratory Therapy Supplies (FLUTTER) DEVI 1 Device by Does not apply route as needed. 02/04/20   Lauraine Rinne, NP  ?revefenacin (YUPELRI) 175 MCG/3ML nebulizer solution Take 3 mLs (175 mcg total) by nebulization daily. ?Patient not taking: Reported on 12/31/2021 03/06/20   Lauraine Rinne, NP  ?sulfamethoxazole-trimethoprim (BACTRIM) 400-80 MG tablet Take 1 tablet by mouth 2 (two) times daily. ?Patient not taking: Reported on 12/31/2021 12/24/21   Copland, Gay Filler, MD  ?   ? ?Allergies    ?Patient has no known allergies.   ? ?Review of Systems   ?Review of Systems  ?Musculoskeletal:  Positive for gait problem.  ?Neurological:  Positive for tremors and  weakness (Generalized).  ?All other systems reviewed and are negative. ? ?Physical Exam ?Updated Vital Signs ?BP 100/66   Pulse 86   Temp 97.7 ?F (36.5 ?C)   Resp 15   Ht 5' 4"  (1.626 m)   Wt 70.8 kg   SpO2 95

## 2021-12-31 NOTE — Telephone Encounter (Signed)
FYI ? ?Triage nurse called and stated patient feels weak, tired, legs are swollen,  hx of CHF.  Patient has been falling a lot.  Vitals are fine.  Nurse would like to send to ER.  She wanted to know what we would like to do either see in office or ER.  Agreed with her plan to send pt to ER.  ?

## 2021-12-31 NOTE — Discharge Instructions (Addendum)
Your weakness is likely from dementia. ? ?Palliative care services will contact you and try to set up home health care for you ? ?Take Augmentin twice daily for a week ? ?See your doctor for follow up ? ?Return to ER if you have worse weakness, cough, fever, fall ?

## 2022-01-01 ENCOUNTER — Telehealth: Payer: Self-pay

## 2022-01-01 NOTE — Addendum Note (Signed)
Addended by: Lamar Blinks C on: 01/01/2022 04:10 PM ? ? Modules accepted: Orders ? ?

## 2022-01-01 NOTE — Telephone Encounter (Signed)
Spoke with patient's daughter [name] and scheduled a in person Palliative Consult for 01/04/22 @ 3:30 PM.  ? ?Consent obtained; updated Outlook/Netsmart/Team List and Epic.  ? ?

## 2022-01-02 ENCOUNTER — Telehealth: Payer: Self-pay | Admitting: Family Medicine

## 2022-01-02 ENCOUNTER — Encounter: Payer: Self-pay | Admitting: Family Medicine

## 2022-01-02 DIAGNOSIS — K219 Gastro-esophageal reflux disease without esophagitis: Secondary | ICD-10-CM | POA: Diagnosis not present

## 2022-01-02 DIAGNOSIS — I4819 Other persistent atrial fibrillation: Secondary | ICD-10-CM | POA: Diagnosis not present

## 2022-01-02 DIAGNOSIS — S92512D Displaced fracture of proximal phalanx of left lesser toe(s), subsequent encounter for fracture with routine healing: Secondary | ICD-10-CM | POA: Diagnosis not present

## 2022-01-02 DIAGNOSIS — S92511D Displaced fracture of proximal phalanx of right lesser toe(s), subsequent encounter for fracture with routine healing: Secondary | ICD-10-CM | POA: Diagnosis not present

## 2022-01-02 DIAGNOSIS — F419 Anxiety disorder, unspecified: Secondary | ICD-10-CM | POA: Diagnosis not present

## 2022-01-02 DIAGNOSIS — E538 Deficiency of other specified B group vitamins: Secondary | ICD-10-CM | POA: Diagnosis not present

## 2022-01-02 NOTE — Telephone Encounter (Signed)
Physical therapist from [place] is calling to report a patient fall, she states the patient fell yesterday in her bathroom. She states patient did not hit her head and has bruising on right hip and right buttock. A pubic percussion test was done and it was negative, patient was able to stand but was sore and vitals were normal.  ?

## 2022-01-03 ENCOUNTER — Telehealth: Payer: Self-pay | Admitting: Family Medicine

## 2022-01-03 DIAGNOSIS — G2581 Restless legs syndrome: Secondary | ICD-10-CM | POA: Diagnosis not present

## 2022-01-03 DIAGNOSIS — I130 Hypertensive heart and chronic kidney disease with heart failure and stage 1 through stage 4 chronic kidney disease, or unspecified chronic kidney disease: Secondary | ICD-10-CM | POA: Diagnosis not present

## 2022-01-03 DIAGNOSIS — H3530 Unspecified macular degeneration: Secondary | ICD-10-CM | POA: Diagnosis not present

## 2022-01-03 DIAGNOSIS — E1122 Type 2 diabetes mellitus with diabetic chronic kidney disease: Secondary | ICD-10-CM | POA: Diagnosis not present

## 2022-01-03 DIAGNOSIS — K219 Gastro-esophageal reflux disease without esophagitis: Secondary | ICD-10-CM | POA: Diagnosis not present

## 2022-01-03 DIAGNOSIS — E039 Hypothyroidism, unspecified: Secondary | ICD-10-CM | POA: Diagnosis not present

## 2022-01-03 DIAGNOSIS — Z9181 History of falling: Secondary | ICD-10-CM | POA: Diagnosis not present

## 2022-01-03 DIAGNOSIS — J189 Pneumonia, unspecified organism: Secondary | ICD-10-CM | POA: Diagnosis not present

## 2022-01-03 DIAGNOSIS — E785 Hyperlipidemia, unspecified: Secondary | ICD-10-CM | POA: Diagnosis not present

## 2022-01-03 DIAGNOSIS — N1832 Chronic kidney disease, stage 3b: Secondary | ICD-10-CM | POA: Diagnosis not present

## 2022-01-03 DIAGNOSIS — I5023 Acute on chronic systolic (congestive) heart failure: Secondary | ICD-10-CM | POA: Diagnosis not present

## 2022-01-03 DIAGNOSIS — I4891 Unspecified atrial fibrillation: Secondary | ICD-10-CM | POA: Diagnosis not present

## 2022-01-03 DIAGNOSIS — G4700 Insomnia, unspecified: Secondary | ICD-10-CM | POA: Diagnosis not present

## 2022-01-03 DIAGNOSIS — H9190 Unspecified hearing loss, unspecified ear: Secondary | ICD-10-CM | POA: Diagnosis not present

## 2022-01-03 DIAGNOSIS — Z87440 Personal history of urinary (tract) infections: Secondary | ICD-10-CM | POA: Diagnosis not present

## 2022-01-03 DIAGNOSIS — R251 Tremor, unspecified: Secondary | ICD-10-CM | POA: Diagnosis not present

## 2022-01-03 DIAGNOSIS — F32A Depression, unspecified: Secondary | ICD-10-CM | POA: Diagnosis not present

## 2022-01-03 DIAGNOSIS — Z515 Encounter for palliative care: Secondary | ICD-10-CM | POA: Diagnosis not present

## 2022-01-03 NOTE — Telephone Encounter (Signed)
Fyi.

## 2022-01-03 NOTE — Telephone Encounter (Signed)
[facility] wanted to make pcp aware that he was not able top see her today for ot due to her being admitted to hospice.  ?

## 2022-01-04 ENCOUNTER — Other Ambulatory Visit: Payer: Self-pay | Admitting: Family Medicine

## 2022-01-04 LAB — URINE CULTURE: Culture: 3000 — AB

## 2022-01-05 LAB — CULTURE, BLOOD (ROUTINE X 2)
Culture: NO GROWTH
Culture: NO GROWTH
Special Requests: ADEQUATE
Special Requests: ADEQUATE

## 2022-01-05 NOTE — Telephone Encounter (Signed)
Post ED Visit - Positive Culture Follow-up ? ?Culture report reviewed by antimicrobial stewardship pharmacist: ?[name] ?'[]'$  [name], Pharm.D. ?'[]'$  [name], Pharm.D., BCPS AQ-ID ?'[]'$  [name], Pharm.D., BCPS ?'[]'$  [name], Pharm.D., BCPS ?'[]'$  [name], Pharm.D., BCPS, AAHIVP ?'[]'$  [name], Pharm.D., BCPS, AAHIVP ?'[]'$  [name], PharmD, BCPS ?'[]'$  [name], PharmD, BCPS ?'[]'$  [name], PharmD, BCPS ?'[]'$  [name], PharmD ?'[]'$  [name], PharmD, BCPS ?'[]'$  , PharmD ? ?[name] ?'[]'$  [name], PharmD ?'[]'$  [name], PharmD ?'[]'$  [name], PharmD ?'[]'$  [name], Rph ?'[]'$  [name], PharmD ?'[]'$  [name], PharmD ?'[]'$  [name], PharmD ?'[]'$  [name], PharmD ?'[]'$  [name], PharmD ?'[]'$  [name], PharmD ?'[]'$  [name], PharmD ?'[]'$  [name], PharmD ?'[x]'$  [name] PharmD ? ? ?Positive urine culture. Treated with Augmentin ?Growth on urine culture not significant and would continue Augmentin for PNA per  [name], PHarmD and no further patient follow-up is required at this time. ? ?Melissa [name] ?01/05/2022, 4:04 PM ?  ?

## 2022-01-07 ENCOUNTER — Other Ambulatory Visit: Payer: Self-pay | Admitting: Endocrinology

## 2022-01-07 ENCOUNTER — Other Ambulatory Visit: Payer: Self-pay | Admitting: Family Medicine

## 2022-01-08 ENCOUNTER — Other Ambulatory Visit: Payer: Self-pay | Admitting: Family Medicine

## 2022-01-08 DIAGNOSIS — G4700 Insomnia, unspecified: Secondary | ICD-10-CM | POA: Diagnosis not present

## 2022-01-08 DIAGNOSIS — N1832 Chronic kidney disease, stage 3b: Secondary | ICD-10-CM | POA: Diagnosis not present

## 2022-01-08 DIAGNOSIS — I130 Hypertensive heart and chronic kidney disease with heart failure and stage 1 through stage 4 chronic kidney disease, or unspecified chronic kidney disease: Secondary | ICD-10-CM | POA: Diagnosis not present

## 2022-01-08 DIAGNOSIS — I5023 Acute on chronic systolic (congestive) heart failure: Secondary | ICD-10-CM | POA: Diagnosis not present

## 2022-01-08 DIAGNOSIS — I4891 Unspecified atrial fibrillation: Secondary | ICD-10-CM | POA: Diagnosis not present

## 2022-01-08 DIAGNOSIS — E1122 Type 2 diabetes mellitus with diabetic chronic kidney disease: Secondary | ICD-10-CM | POA: Diagnosis not present

## 2022-01-08 DIAGNOSIS — R54 Age-related physical debility: Secondary | ICD-10-CM

## 2022-01-11 DIAGNOSIS — E1122 Type 2 diabetes mellitus with diabetic chronic kidney disease: Secondary | ICD-10-CM | POA: Diagnosis not present

## 2022-01-11 DIAGNOSIS — I5023 Acute on chronic systolic (congestive) heart failure: Secondary | ICD-10-CM | POA: Diagnosis not present

## 2022-01-11 DIAGNOSIS — N1832 Chronic kidney disease, stage 3b: Secondary | ICD-10-CM | POA: Diagnosis not present

## 2022-01-11 DIAGNOSIS — I130 Hypertensive heart and chronic kidney disease with heart failure and stage 1 through stage 4 chronic kidney disease, or unspecified chronic kidney disease: Secondary | ICD-10-CM | POA: Diagnosis not present

## 2022-01-11 DIAGNOSIS — I4891 Unspecified atrial fibrillation: Secondary | ICD-10-CM | POA: Diagnosis not present

## 2022-01-11 DIAGNOSIS — G4700 Insomnia, unspecified: Secondary | ICD-10-CM | POA: Diagnosis not present

## 2022-01-12 DIAGNOSIS — F32A Depression, unspecified: Secondary | ICD-10-CM | POA: Diagnosis not present

## 2022-01-12 DIAGNOSIS — N1832 Chronic kidney disease, stage 3b: Secondary | ICD-10-CM | POA: Diagnosis not present

## 2022-01-12 DIAGNOSIS — H9190 Unspecified hearing loss, unspecified ear: Secondary | ICD-10-CM | POA: Diagnosis not present

## 2022-01-12 DIAGNOSIS — Z515 Encounter for palliative care: Secondary | ICD-10-CM | POA: Diagnosis not present

## 2022-01-12 DIAGNOSIS — G2581 Restless legs syndrome: Secondary | ICD-10-CM | POA: Diagnosis not present

## 2022-01-12 DIAGNOSIS — I4891 Unspecified atrial fibrillation: Secondary | ICD-10-CM | POA: Diagnosis not present

## 2022-01-12 DIAGNOSIS — E039 Hypothyroidism, unspecified: Secondary | ICD-10-CM | POA: Diagnosis not present

## 2022-01-12 DIAGNOSIS — E785 Hyperlipidemia, unspecified: Secondary | ICD-10-CM | POA: Diagnosis not present

## 2022-01-12 DIAGNOSIS — G4700 Insomnia, unspecified: Secondary | ICD-10-CM | POA: Diagnosis not present

## 2022-01-12 DIAGNOSIS — H3530 Unspecified macular degeneration: Secondary | ICD-10-CM | POA: Diagnosis not present

## 2022-01-12 DIAGNOSIS — I130 Hypertensive heart and chronic kidney disease with heart failure and stage 1 through stage 4 chronic kidney disease, or unspecified chronic kidney disease: Secondary | ICD-10-CM | POA: Diagnosis not present

## 2022-01-12 DIAGNOSIS — I5023 Acute on chronic systolic (congestive) heart failure: Secondary | ICD-10-CM | POA: Diagnosis not present

## 2022-01-12 DIAGNOSIS — J189 Pneumonia, unspecified organism: Secondary | ICD-10-CM | POA: Diagnosis not present

## 2022-01-12 DIAGNOSIS — R251 Tremor, unspecified: Secondary | ICD-10-CM | POA: Diagnosis not present

## 2022-01-12 DIAGNOSIS — Z9181 History of falling: Secondary | ICD-10-CM | POA: Diagnosis not present

## 2022-01-12 DIAGNOSIS — Z87440 Personal history of urinary (tract) infections: Secondary | ICD-10-CM | POA: Diagnosis not present

## 2022-01-12 DIAGNOSIS — E1122 Type 2 diabetes mellitus with diabetic chronic kidney disease: Secondary | ICD-10-CM | POA: Diagnosis not present

## 2022-01-12 DIAGNOSIS — K219 Gastro-esophageal reflux disease without esophagitis: Secondary | ICD-10-CM | POA: Diagnosis not present

## 2022-01-15 DIAGNOSIS — E1122 Type 2 diabetes mellitus with diabetic chronic kidney disease: Secondary | ICD-10-CM | POA: Diagnosis not present

## 2022-01-15 DIAGNOSIS — I130 Hypertensive heart and chronic kidney disease with heart failure and stage 1 through stage 4 chronic kidney disease, or unspecified chronic kidney disease: Secondary | ICD-10-CM | POA: Diagnosis not present

## 2022-01-15 DIAGNOSIS — I5023 Acute on chronic systolic (congestive) heart failure: Secondary | ICD-10-CM | POA: Diagnosis not present

## 2022-01-15 DIAGNOSIS — G4700 Insomnia, unspecified: Secondary | ICD-10-CM | POA: Diagnosis not present

## 2022-01-15 DIAGNOSIS — N1832 Chronic kidney disease, stage 3b: Secondary | ICD-10-CM | POA: Diagnosis not present

## 2022-01-15 DIAGNOSIS — I4891 Unspecified atrial fibrillation: Secondary | ICD-10-CM | POA: Diagnosis not present

## 2022-01-16 DIAGNOSIS — I5023 Acute on chronic systolic (congestive) heart failure: Secondary | ICD-10-CM | POA: Diagnosis not present

## 2022-01-16 DIAGNOSIS — I130 Hypertensive heart and chronic kidney disease with heart failure and stage 1 through stage 4 chronic kidney disease, or unspecified chronic kidney disease: Secondary | ICD-10-CM | POA: Diagnosis not present

## 2022-01-16 DIAGNOSIS — I4891 Unspecified atrial fibrillation: Secondary | ICD-10-CM | POA: Diagnosis not present

## 2022-01-16 DIAGNOSIS — E1122 Type 2 diabetes mellitus with diabetic chronic kidney disease: Secondary | ICD-10-CM | POA: Diagnosis not present

## 2022-01-16 DIAGNOSIS — G4700 Insomnia, unspecified: Secondary | ICD-10-CM | POA: Diagnosis not present

## 2022-01-16 DIAGNOSIS — N1832 Chronic kidney disease, stage 3b: Secondary | ICD-10-CM | POA: Diagnosis not present

## 2022-01-17 DIAGNOSIS — I130 Hypertensive heart and chronic kidney disease with heart failure and stage 1 through stage 4 chronic kidney disease, or unspecified chronic kidney disease: Secondary | ICD-10-CM | POA: Diagnosis not present

## 2022-01-17 DIAGNOSIS — E1122 Type 2 diabetes mellitus with diabetic chronic kidney disease: Secondary | ICD-10-CM | POA: Diagnosis not present

## 2022-01-17 DIAGNOSIS — I4891 Unspecified atrial fibrillation: Secondary | ICD-10-CM | POA: Diagnosis not present

## 2022-01-17 DIAGNOSIS — I5023 Acute on chronic systolic (congestive) heart failure: Secondary | ICD-10-CM | POA: Diagnosis not present

## 2022-01-17 DIAGNOSIS — G4700 Insomnia, unspecified: Secondary | ICD-10-CM | POA: Diagnosis not present

## 2022-01-17 DIAGNOSIS — N1832 Chronic kidney disease, stage 3b: Secondary | ICD-10-CM | POA: Diagnosis not present

## 2022-01-17 DIAGNOSIS — M25562 Pain in left knee: Secondary | ICD-10-CM | POA: Diagnosis not present

## 2022-01-18 DIAGNOSIS — E1122 Type 2 diabetes mellitus with diabetic chronic kidney disease: Secondary | ICD-10-CM | POA: Diagnosis not present

## 2022-01-18 DIAGNOSIS — I5023 Acute on chronic systolic (congestive) heart failure: Secondary | ICD-10-CM | POA: Diagnosis not present

## 2022-01-18 DIAGNOSIS — G4700 Insomnia, unspecified: Secondary | ICD-10-CM | POA: Diagnosis not present

## 2022-01-18 DIAGNOSIS — N1832 Chronic kidney disease, stage 3b: Secondary | ICD-10-CM | POA: Diagnosis not present

## 2022-01-18 DIAGNOSIS — I4891 Unspecified atrial fibrillation: Secondary | ICD-10-CM | POA: Diagnosis not present

## 2022-01-18 DIAGNOSIS — I130 Hypertensive heart and chronic kidney disease with heart failure and stage 1 through stage 4 chronic kidney disease, or unspecified chronic kidney disease: Secondary | ICD-10-CM | POA: Diagnosis not present

## 2022-01-23 DIAGNOSIS — I130 Hypertensive heart and chronic kidney disease with heart failure and stage 1 through stage 4 chronic kidney disease, or unspecified chronic kidney disease: Secondary | ICD-10-CM | POA: Diagnosis not present

## 2022-01-23 DIAGNOSIS — I4891 Unspecified atrial fibrillation: Secondary | ICD-10-CM | POA: Diagnosis not present

## 2022-01-23 DIAGNOSIS — I5023 Acute on chronic systolic (congestive) heart failure: Secondary | ICD-10-CM | POA: Diagnosis not present

## 2022-01-23 DIAGNOSIS — E1122 Type 2 diabetes mellitus with diabetic chronic kidney disease: Secondary | ICD-10-CM | POA: Diagnosis not present

## 2022-01-23 DIAGNOSIS — G4700 Insomnia, unspecified: Secondary | ICD-10-CM | POA: Diagnosis not present

## 2022-01-23 DIAGNOSIS — N1832 Chronic kidney disease, stage 3b: Secondary | ICD-10-CM | POA: Diagnosis not present

## 2022-01-24 DIAGNOSIS — I4891 Unspecified atrial fibrillation: Secondary | ICD-10-CM | POA: Diagnosis not present

## 2022-01-24 DIAGNOSIS — I130 Hypertensive heart and chronic kidney disease with heart failure and stage 1 through stage 4 chronic kidney disease, or unspecified chronic kidney disease: Secondary | ICD-10-CM | POA: Diagnosis not present

## 2022-01-24 DIAGNOSIS — I5023 Acute on chronic systolic (congestive) heart failure: Secondary | ICD-10-CM | POA: Diagnosis not present

## 2022-01-24 DIAGNOSIS — E1122 Type 2 diabetes mellitus with diabetic chronic kidney disease: Secondary | ICD-10-CM | POA: Diagnosis not present

## 2022-01-24 DIAGNOSIS — N1832 Chronic kidney disease, stage 3b: Secondary | ICD-10-CM | POA: Diagnosis not present

## 2022-01-24 DIAGNOSIS — G4700 Insomnia, unspecified: Secondary | ICD-10-CM | POA: Diagnosis not present

## 2022-01-25 DIAGNOSIS — N1832 Chronic kidney disease, stage 3b: Secondary | ICD-10-CM | POA: Diagnosis not present

## 2022-01-25 DIAGNOSIS — G4700 Insomnia, unspecified: Secondary | ICD-10-CM | POA: Diagnosis not present

## 2022-01-25 DIAGNOSIS — I130 Hypertensive heart and chronic kidney disease with heart failure and stage 1 through stage 4 chronic kidney disease, or unspecified chronic kidney disease: Secondary | ICD-10-CM | POA: Diagnosis not present

## 2022-01-25 DIAGNOSIS — I5023 Acute on chronic systolic (congestive) heart failure: Secondary | ICD-10-CM | POA: Diagnosis not present

## 2022-01-25 DIAGNOSIS — I4891 Unspecified atrial fibrillation: Secondary | ICD-10-CM | POA: Diagnosis not present

## 2022-01-25 DIAGNOSIS — E1122 Type 2 diabetes mellitus with diabetic chronic kidney disease: Secondary | ICD-10-CM | POA: Diagnosis not present

## 2022-01-29 DIAGNOSIS — N1832 Chronic kidney disease, stage 3b: Secondary | ICD-10-CM | POA: Diagnosis not present

## 2022-01-29 DIAGNOSIS — I130 Hypertensive heart and chronic kidney disease with heart failure and stage 1 through stage 4 chronic kidney disease, or unspecified chronic kidney disease: Secondary | ICD-10-CM | POA: Diagnosis not present

## 2022-01-29 DIAGNOSIS — E1122 Type 2 diabetes mellitus with diabetic chronic kidney disease: Secondary | ICD-10-CM | POA: Diagnosis not present

## 2022-01-29 DIAGNOSIS — G4700 Insomnia, unspecified: Secondary | ICD-10-CM | POA: Diagnosis not present

## 2022-01-29 DIAGNOSIS — I4891 Unspecified atrial fibrillation: Secondary | ICD-10-CM | POA: Diagnosis not present

## 2022-01-29 DIAGNOSIS — I5023 Acute on chronic systolic (congestive) heart failure: Secondary | ICD-10-CM | POA: Diagnosis not present

## 2022-01-30 DIAGNOSIS — N1832 Chronic kidney disease, stage 3b: Secondary | ICD-10-CM | POA: Diagnosis not present

## 2022-01-30 DIAGNOSIS — E1122 Type 2 diabetes mellitus with diabetic chronic kidney disease: Secondary | ICD-10-CM | POA: Diagnosis not present

## 2022-01-30 DIAGNOSIS — I130 Hypertensive heart and chronic kidney disease with heart failure and stage 1 through stage 4 chronic kidney disease, or unspecified chronic kidney disease: Secondary | ICD-10-CM | POA: Diagnosis not present

## 2022-01-30 DIAGNOSIS — I4891 Unspecified atrial fibrillation: Secondary | ICD-10-CM | POA: Diagnosis not present

## 2022-01-30 DIAGNOSIS — I5023 Acute on chronic systolic (congestive) heart failure: Secondary | ICD-10-CM | POA: Diagnosis not present

## 2022-01-30 DIAGNOSIS — G4700 Insomnia, unspecified: Secondary | ICD-10-CM | POA: Diagnosis not present

## 2022-01-31 DIAGNOSIS — N1832 Chronic kidney disease, stage 3b: Secondary | ICD-10-CM | POA: Diagnosis not present

## 2022-01-31 DIAGNOSIS — G4700 Insomnia, unspecified: Secondary | ICD-10-CM | POA: Diagnosis not present

## 2022-01-31 DIAGNOSIS — I4891 Unspecified atrial fibrillation: Secondary | ICD-10-CM | POA: Diagnosis not present

## 2022-01-31 DIAGNOSIS — I130 Hypertensive heart and chronic kidney disease with heart failure and stage 1 through stage 4 chronic kidney disease, or unspecified chronic kidney disease: Secondary | ICD-10-CM | POA: Diagnosis not present

## 2022-01-31 DIAGNOSIS — E1122 Type 2 diabetes mellitus with diabetic chronic kidney disease: Secondary | ICD-10-CM | POA: Diagnosis not present

## 2022-01-31 DIAGNOSIS — I5023 Acute on chronic systolic (congestive) heart failure: Secondary | ICD-10-CM | POA: Diagnosis not present

## 2022-02-01 ENCOUNTER — Other Ambulatory Visit: Payer: Self-pay | Admitting: Neurology

## 2022-02-01 DIAGNOSIS — N1832 Chronic kidney disease, stage 3b: Secondary | ICD-10-CM | POA: Diagnosis not present

## 2022-02-01 DIAGNOSIS — I4891 Unspecified atrial fibrillation: Secondary | ICD-10-CM | POA: Diagnosis not present

## 2022-02-01 DIAGNOSIS — I5023 Acute on chronic systolic (congestive) heart failure: Secondary | ICD-10-CM | POA: Diagnosis not present

## 2022-02-01 DIAGNOSIS — G4700 Insomnia, unspecified: Secondary | ICD-10-CM | POA: Diagnosis not present

## 2022-02-01 DIAGNOSIS — E1122 Type 2 diabetes mellitus with diabetic chronic kidney disease: Secondary | ICD-10-CM | POA: Diagnosis not present

## 2022-02-01 DIAGNOSIS — I130 Hypertensive heart and chronic kidney disease with heart failure and stage 1 through stage 4 chronic kidney disease, or unspecified chronic kidney disease: Secondary | ICD-10-CM | POA: Diagnosis not present

## 2022-02-05 DIAGNOSIS — G4700 Insomnia, unspecified: Secondary | ICD-10-CM | POA: Diagnosis not present

## 2022-02-05 DIAGNOSIS — I130 Hypertensive heart and chronic kidney disease with heart failure and stage 1 through stage 4 chronic kidney disease, or unspecified chronic kidney disease: Secondary | ICD-10-CM | POA: Diagnosis not present

## 2022-02-05 DIAGNOSIS — E1122 Type 2 diabetes mellitus with diabetic chronic kidney disease: Secondary | ICD-10-CM | POA: Diagnosis not present

## 2022-02-05 DIAGNOSIS — I5023 Acute on chronic systolic (congestive) heart failure: Secondary | ICD-10-CM | POA: Diagnosis not present

## 2022-02-05 DIAGNOSIS — N1832 Chronic kidney disease, stage 3b: Secondary | ICD-10-CM | POA: Diagnosis not present

## 2022-02-05 DIAGNOSIS — I4891 Unspecified atrial fibrillation: Secondary | ICD-10-CM | POA: Diagnosis not present

## 2022-02-07 DIAGNOSIS — G4700 Insomnia, unspecified: Secondary | ICD-10-CM | POA: Diagnosis not present

## 2022-02-07 DIAGNOSIS — I130 Hypertensive heart and chronic kidney disease with heart failure and stage 1 through stage 4 chronic kidney disease, or unspecified chronic kidney disease: Secondary | ICD-10-CM | POA: Diagnosis not present

## 2022-02-07 DIAGNOSIS — I5023 Acute on chronic systolic (congestive) heart failure: Secondary | ICD-10-CM | POA: Diagnosis not present

## 2022-02-07 DIAGNOSIS — E1122 Type 2 diabetes mellitus with diabetic chronic kidney disease: Secondary | ICD-10-CM | POA: Diagnosis not present

## 2022-02-07 DIAGNOSIS — N1832 Chronic kidney disease, stage 3b: Secondary | ICD-10-CM | POA: Diagnosis not present

## 2022-02-07 DIAGNOSIS — I4891 Unspecified atrial fibrillation: Secondary | ICD-10-CM | POA: Diagnosis not present

## 2022-02-08 DIAGNOSIS — G4700 Insomnia, unspecified: Secondary | ICD-10-CM | POA: Diagnosis not present

## 2022-02-08 DIAGNOSIS — I4891 Unspecified atrial fibrillation: Secondary | ICD-10-CM | POA: Diagnosis not present

## 2022-02-08 DIAGNOSIS — N1832 Chronic kidney disease, stage 3b: Secondary | ICD-10-CM | POA: Diagnosis not present

## 2022-02-08 DIAGNOSIS — I5023 Acute on chronic systolic (congestive) heart failure: Secondary | ICD-10-CM | POA: Diagnosis not present

## 2022-02-08 DIAGNOSIS — E1122 Type 2 diabetes mellitus with diabetic chronic kidney disease: Secondary | ICD-10-CM | POA: Diagnosis not present

## 2022-02-08 DIAGNOSIS — I130 Hypertensive heart and chronic kidney disease with heart failure and stage 1 through stage 4 chronic kidney disease, or unspecified chronic kidney disease: Secondary | ICD-10-CM | POA: Diagnosis not present

## 2022-02-11 DIAGNOSIS — Z515 Encounter for palliative care: Secondary | ICD-10-CM | POA: Diagnosis not present

## 2022-02-11 DIAGNOSIS — H9190 Unspecified hearing loss, unspecified ear: Secondary | ICD-10-CM | POA: Diagnosis not present

## 2022-02-11 DIAGNOSIS — Z87440 Personal history of urinary (tract) infections: Secondary | ICD-10-CM | POA: Diagnosis not present

## 2022-02-11 DIAGNOSIS — E1122 Type 2 diabetes mellitus with diabetic chronic kidney disease: Secondary | ICD-10-CM | POA: Diagnosis not present

## 2022-02-11 DIAGNOSIS — E785 Hyperlipidemia, unspecified: Secondary | ICD-10-CM | POA: Diagnosis not present

## 2022-02-11 DIAGNOSIS — R251 Tremor, unspecified: Secondary | ICD-10-CM | POA: Diagnosis not present

## 2022-02-11 DIAGNOSIS — H3530 Unspecified macular degeneration: Secondary | ICD-10-CM | POA: Diagnosis not present

## 2022-02-11 DIAGNOSIS — N1832 Chronic kidney disease, stage 3b: Secondary | ICD-10-CM | POA: Diagnosis not present

## 2022-02-11 DIAGNOSIS — Z9181 History of falling: Secondary | ICD-10-CM | POA: Diagnosis not present

## 2022-02-11 DIAGNOSIS — F32A Depression, unspecified: Secondary | ICD-10-CM | POA: Diagnosis not present

## 2022-02-11 DIAGNOSIS — K219 Gastro-esophageal reflux disease without esophagitis: Secondary | ICD-10-CM | POA: Diagnosis not present

## 2022-02-11 DIAGNOSIS — I4891 Unspecified atrial fibrillation: Secondary | ICD-10-CM | POA: Diagnosis not present

## 2022-02-11 DIAGNOSIS — G4700 Insomnia, unspecified: Secondary | ICD-10-CM | POA: Diagnosis not present

## 2022-02-11 DIAGNOSIS — I130 Hypertensive heart and chronic kidney disease with heart failure and stage 1 through stage 4 chronic kidney disease, or unspecified chronic kidney disease: Secondary | ICD-10-CM | POA: Diagnosis not present

## 2022-02-11 DIAGNOSIS — E039 Hypothyroidism, unspecified: Secondary | ICD-10-CM | POA: Diagnosis not present

## 2022-02-11 DIAGNOSIS — I5023 Acute on chronic systolic (congestive) heart failure: Secondary | ICD-10-CM | POA: Diagnosis not present

## 2022-02-11 DIAGNOSIS — J189 Pneumonia, unspecified organism: Secondary | ICD-10-CM | POA: Diagnosis not present

## 2022-02-11 DIAGNOSIS — G2581 Restless legs syndrome: Secondary | ICD-10-CM | POA: Diagnosis not present

## 2022-02-12 DIAGNOSIS — I5023 Acute on chronic systolic (congestive) heart failure: Secondary | ICD-10-CM | POA: Diagnosis not present

## 2022-02-12 DIAGNOSIS — G4700 Insomnia, unspecified: Secondary | ICD-10-CM | POA: Diagnosis not present

## 2022-02-12 DIAGNOSIS — E1122 Type 2 diabetes mellitus with diabetic chronic kidney disease: Secondary | ICD-10-CM | POA: Diagnosis not present

## 2022-02-12 DIAGNOSIS — N1832 Chronic kidney disease, stage 3b: Secondary | ICD-10-CM | POA: Diagnosis not present

## 2022-02-12 DIAGNOSIS — I130 Hypertensive heart and chronic kidney disease with heart failure and stage 1 through stage 4 chronic kidney disease, or unspecified chronic kidney disease: Secondary | ICD-10-CM | POA: Diagnosis not present

## 2022-02-12 DIAGNOSIS — I4891 Unspecified atrial fibrillation: Secondary | ICD-10-CM | POA: Diagnosis not present

## 2022-02-13 DIAGNOSIS — G4700 Insomnia, unspecified: Secondary | ICD-10-CM | POA: Diagnosis not present

## 2022-02-13 DIAGNOSIS — I130 Hypertensive heart and chronic kidney disease with heart failure and stage 1 through stage 4 chronic kidney disease, or unspecified chronic kidney disease: Secondary | ICD-10-CM | POA: Diagnosis not present

## 2022-02-13 DIAGNOSIS — I4891 Unspecified atrial fibrillation: Secondary | ICD-10-CM | POA: Diagnosis not present

## 2022-02-13 DIAGNOSIS — N1832 Chronic kidney disease, stage 3b: Secondary | ICD-10-CM | POA: Diagnosis not present

## 2022-02-13 DIAGNOSIS — E1122 Type 2 diabetes mellitus with diabetic chronic kidney disease: Secondary | ICD-10-CM | POA: Diagnosis not present

## 2022-02-13 DIAGNOSIS — I5023 Acute on chronic systolic (congestive) heart failure: Secondary | ICD-10-CM | POA: Diagnosis not present

## 2022-02-15 DIAGNOSIS — G4700 Insomnia, unspecified: Secondary | ICD-10-CM | POA: Diagnosis not present

## 2022-02-15 DIAGNOSIS — I5023 Acute on chronic systolic (congestive) heart failure: Secondary | ICD-10-CM | POA: Diagnosis not present

## 2022-02-15 DIAGNOSIS — I130 Hypertensive heart and chronic kidney disease with heart failure and stage 1 through stage 4 chronic kidney disease, or unspecified chronic kidney disease: Secondary | ICD-10-CM | POA: Diagnosis not present

## 2022-02-15 DIAGNOSIS — E1122 Type 2 diabetes mellitus with diabetic chronic kidney disease: Secondary | ICD-10-CM | POA: Diagnosis not present

## 2022-02-15 DIAGNOSIS — N1832 Chronic kidney disease, stage 3b: Secondary | ICD-10-CM | POA: Diagnosis not present

## 2022-02-15 DIAGNOSIS — I4891 Unspecified atrial fibrillation: Secondary | ICD-10-CM | POA: Diagnosis not present

## 2022-02-19 DIAGNOSIS — I130 Hypertensive heart and chronic kidney disease with heart failure and stage 1 through stage 4 chronic kidney disease, or unspecified chronic kidney disease: Secondary | ICD-10-CM | POA: Diagnosis not present

## 2022-02-19 DIAGNOSIS — G4700 Insomnia, unspecified: Secondary | ICD-10-CM | POA: Diagnosis not present

## 2022-02-19 DIAGNOSIS — E1122 Type 2 diabetes mellitus with diabetic chronic kidney disease: Secondary | ICD-10-CM | POA: Diagnosis not present

## 2022-02-19 DIAGNOSIS — N1832 Chronic kidney disease, stage 3b: Secondary | ICD-10-CM | POA: Diagnosis not present

## 2022-02-19 DIAGNOSIS — I5023 Acute on chronic systolic (congestive) heart failure: Secondary | ICD-10-CM | POA: Diagnosis not present

## 2022-02-19 DIAGNOSIS — I4891 Unspecified atrial fibrillation: Secondary | ICD-10-CM | POA: Diagnosis not present

## 2022-02-21 DIAGNOSIS — N1832 Chronic kidney disease, stage 3b: Secondary | ICD-10-CM | POA: Diagnosis not present

## 2022-02-21 DIAGNOSIS — G4700 Insomnia, unspecified: Secondary | ICD-10-CM | POA: Diagnosis not present

## 2022-02-21 DIAGNOSIS — I5023 Acute on chronic systolic (congestive) heart failure: Secondary | ICD-10-CM | POA: Diagnosis not present

## 2022-02-21 DIAGNOSIS — E1122 Type 2 diabetes mellitus with diabetic chronic kidney disease: Secondary | ICD-10-CM | POA: Diagnosis not present

## 2022-02-21 DIAGNOSIS — I4891 Unspecified atrial fibrillation: Secondary | ICD-10-CM | POA: Diagnosis not present

## 2022-02-21 DIAGNOSIS — I130 Hypertensive heart and chronic kidney disease with heart failure and stage 1 through stage 4 chronic kidney disease, or unspecified chronic kidney disease: Secondary | ICD-10-CM | POA: Diagnosis not present

## 2022-02-22 DIAGNOSIS — G4700 Insomnia, unspecified: Secondary | ICD-10-CM | POA: Diagnosis not present

## 2022-02-22 DIAGNOSIS — I5023 Acute on chronic systolic (congestive) heart failure: Secondary | ICD-10-CM | POA: Diagnosis not present

## 2022-02-22 DIAGNOSIS — E1122 Type 2 diabetes mellitus with diabetic chronic kidney disease: Secondary | ICD-10-CM | POA: Diagnosis not present

## 2022-02-22 DIAGNOSIS — I130 Hypertensive heart and chronic kidney disease with heart failure and stage 1 through stage 4 chronic kidney disease, or unspecified chronic kidney disease: Secondary | ICD-10-CM | POA: Diagnosis not present

## 2022-02-22 DIAGNOSIS — N1832 Chronic kidney disease, stage 3b: Secondary | ICD-10-CM | POA: Diagnosis not present

## 2022-02-22 DIAGNOSIS — I4891 Unspecified atrial fibrillation: Secondary | ICD-10-CM | POA: Diagnosis not present

## 2022-02-26 DIAGNOSIS — N1832 Chronic kidney disease, stage 3b: Secondary | ICD-10-CM | POA: Diagnosis not present

## 2022-02-26 DIAGNOSIS — I4891 Unspecified atrial fibrillation: Secondary | ICD-10-CM | POA: Diagnosis not present

## 2022-02-26 DIAGNOSIS — I130 Hypertensive heart and chronic kidney disease with heart failure and stage 1 through stage 4 chronic kidney disease, or unspecified chronic kidney disease: Secondary | ICD-10-CM | POA: Diagnosis not present

## 2022-02-26 DIAGNOSIS — G4700 Insomnia, unspecified: Secondary | ICD-10-CM | POA: Diagnosis not present

## 2022-02-26 DIAGNOSIS — I5023 Acute on chronic systolic (congestive) heart failure: Secondary | ICD-10-CM | POA: Diagnosis not present

## 2022-02-26 DIAGNOSIS — E1122 Type 2 diabetes mellitus with diabetic chronic kidney disease: Secondary | ICD-10-CM | POA: Diagnosis not present

## 2022-02-27 DIAGNOSIS — I4891 Unspecified atrial fibrillation: Secondary | ICD-10-CM | POA: Diagnosis not present

## 2022-02-27 DIAGNOSIS — G4700 Insomnia, unspecified: Secondary | ICD-10-CM | POA: Diagnosis not present

## 2022-02-27 DIAGNOSIS — N1832 Chronic kidney disease, stage 3b: Secondary | ICD-10-CM | POA: Diagnosis not present

## 2022-02-27 DIAGNOSIS — E1122 Type 2 diabetes mellitus with diabetic chronic kidney disease: Secondary | ICD-10-CM | POA: Diagnosis not present

## 2022-02-27 DIAGNOSIS — I130 Hypertensive heart and chronic kidney disease with heart failure and stage 1 through stage 4 chronic kidney disease, or unspecified chronic kidney disease: Secondary | ICD-10-CM | POA: Diagnosis not present

## 2022-02-27 DIAGNOSIS — I5023 Acute on chronic systolic (congestive) heart failure: Secondary | ICD-10-CM | POA: Diagnosis not present

## 2022-03-01 DIAGNOSIS — I130 Hypertensive heart and chronic kidney disease with heart failure and stage 1 through stage 4 chronic kidney disease, or unspecified chronic kidney disease: Secondary | ICD-10-CM | POA: Diagnosis not present

## 2022-03-01 DIAGNOSIS — G4700 Insomnia, unspecified: Secondary | ICD-10-CM | POA: Diagnosis not present

## 2022-03-01 DIAGNOSIS — I4891 Unspecified atrial fibrillation: Secondary | ICD-10-CM | POA: Diagnosis not present

## 2022-03-01 DIAGNOSIS — N1832 Chronic kidney disease, stage 3b: Secondary | ICD-10-CM | POA: Diagnosis not present

## 2022-03-01 DIAGNOSIS — E1122 Type 2 diabetes mellitus with diabetic chronic kidney disease: Secondary | ICD-10-CM | POA: Diagnosis not present

## 2022-03-01 DIAGNOSIS — I5023 Acute on chronic systolic (congestive) heart failure: Secondary | ICD-10-CM | POA: Diagnosis not present

## 2022-03-05 DIAGNOSIS — E1122 Type 2 diabetes mellitus with diabetic chronic kidney disease: Secondary | ICD-10-CM | POA: Diagnosis not present

## 2022-03-05 DIAGNOSIS — N1832 Chronic kidney disease, stage 3b: Secondary | ICD-10-CM | POA: Diagnosis not present

## 2022-03-05 DIAGNOSIS — I5023 Acute on chronic systolic (congestive) heart failure: Secondary | ICD-10-CM | POA: Diagnosis not present

## 2022-03-05 DIAGNOSIS — G4700 Insomnia, unspecified: Secondary | ICD-10-CM | POA: Diagnosis not present

## 2022-03-05 DIAGNOSIS — I4891 Unspecified atrial fibrillation: Secondary | ICD-10-CM | POA: Diagnosis not present

## 2022-03-05 DIAGNOSIS — I130 Hypertensive heart and chronic kidney disease with heart failure and stage 1 through stage 4 chronic kidney disease, or unspecified chronic kidney disease: Secondary | ICD-10-CM | POA: Diagnosis not present

## 2022-03-06 DIAGNOSIS — E1122 Type 2 diabetes mellitus with diabetic chronic kidney disease: Secondary | ICD-10-CM | POA: Diagnosis not present

## 2022-03-06 DIAGNOSIS — I4891 Unspecified atrial fibrillation: Secondary | ICD-10-CM | POA: Diagnosis not present

## 2022-03-06 DIAGNOSIS — G4700 Insomnia, unspecified: Secondary | ICD-10-CM | POA: Diagnosis not present

## 2022-03-06 DIAGNOSIS — I5023 Acute on chronic systolic (congestive) heart failure: Secondary | ICD-10-CM | POA: Diagnosis not present

## 2022-03-06 DIAGNOSIS — I130 Hypertensive heart and chronic kidney disease with heart failure and stage 1 through stage 4 chronic kidney disease, or unspecified chronic kidney disease: Secondary | ICD-10-CM | POA: Diagnosis not present

## 2022-03-06 DIAGNOSIS — N1832 Chronic kidney disease, stage 3b: Secondary | ICD-10-CM | POA: Diagnosis not present

## 2022-03-08 DIAGNOSIS — I4891 Unspecified atrial fibrillation: Secondary | ICD-10-CM | POA: Diagnosis not present

## 2022-03-08 DIAGNOSIS — I5023 Acute on chronic systolic (congestive) heart failure: Secondary | ICD-10-CM | POA: Diagnosis not present

## 2022-03-08 DIAGNOSIS — G4700 Insomnia, unspecified: Secondary | ICD-10-CM | POA: Diagnosis not present

## 2022-03-08 DIAGNOSIS — N1832 Chronic kidney disease, stage 3b: Secondary | ICD-10-CM | POA: Diagnosis not present

## 2022-03-08 DIAGNOSIS — E1122 Type 2 diabetes mellitus with diabetic chronic kidney disease: Secondary | ICD-10-CM | POA: Diagnosis not present

## 2022-03-08 DIAGNOSIS — I130 Hypertensive heart and chronic kidney disease with heart failure and stage 1 through stage 4 chronic kidney disease, or unspecified chronic kidney disease: Secondary | ICD-10-CM | POA: Diagnosis not present

## 2022-03-12 DIAGNOSIS — I130 Hypertensive heart and chronic kidney disease with heart failure and stage 1 through stage 4 chronic kidney disease, or unspecified chronic kidney disease: Secondary | ICD-10-CM | POA: Diagnosis not present

## 2022-03-12 DIAGNOSIS — G4700 Insomnia, unspecified: Secondary | ICD-10-CM | POA: Diagnosis not present

## 2022-03-12 DIAGNOSIS — E1122 Type 2 diabetes mellitus with diabetic chronic kidney disease: Secondary | ICD-10-CM | POA: Diagnosis not present

## 2022-03-12 DIAGNOSIS — I4891 Unspecified atrial fibrillation: Secondary | ICD-10-CM | POA: Diagnosis not present

## 2022-03-12 DIAGNOSIS — I5023 Acute on chronic systolic (congestive) heart failure: Secondary | ICD-10-CM | POA: Diagnosis not present

## 2022-03-12 DIAGNOSIS — N1832 Chronic kidney disease, stage 3b: Secondary | ICD-10-CM | POA: Diagnosis not present

## 2022-03-14 DIAGNOSIS — I5023 Acute on chronic systolic (congestive) heart failure: Secondary | ICD-10-CM | POA: Diagnosis not present

## 2022-03-14 DIAGNOSIS — J189 Pneumonia, unspecified organism: Secondary | ICD-10-CM | POA: Diagnosis not present

## 2022-03-14 DIAGNOSIS — H3530 Unspecified macular degeneration: Secondary | ICD-10-CM | POA: Diagnosis not present

## 2022-03-14 DIAGNOSIS — Z87440 Personal history of urinary (tract) infections: Secondary | ICD-10-CM | POA: Diagnosis not present

## 2022-03-14 DIAGNOSIS — K219 Gastro-esophageal reflux disease without esophagitis: Secondary | ICD-10-CM | POA: Diagnosis not present

## 2022-03-14 DIAGNOSIS — E785 Hyperlipidemia, unspecified: Secondary | ICD-10-CM | POA: Diagnosis not present

## 2022-03-14 DIAGNOSIS — I4891 Unspecified atrial fibrillation: Secondary | ICD-10-CM | POA: Diagnosis not present

## 2022-03-14 DIAGNOSIS — G2581 Restless legs syndrome: Secondary | ICD-10-CM | POA: Diagnosis not present

## 2022-03-14 DIAGNOSIS — E1122 Type 2 diabetes mellitus with diabetic chronic kidney disease: Secondary | ICD-10-CM | POA: Diagnosis not present

## 2022-03-14 DIAGNOSIS — R251 Tremor, unspecified: Secondary | ICD-10-CM | POA: Diagnosis not present

## 2022-03-14 DIAGNOSIS — G4700 Insomnia, unspecified: Secondary | ICD-10-CM | POA: Diagnosis not present

## 2022-03-14 DIAGNOSIS — I130 Hypertensive heart and chronic kidney disease with heart failure and stage 1 through stage 4 chronic kidney disease, or unspecified chronic kidney disease: Secondary | ICD-10-CM | POA: Diagnosis not present

## 2022-03-14 DIAGNOSIS — Z515 Encounter for palliative care: Secondary | ICD-10-CM | POA: Diagnosis not present

## 2022-03-14 DIAGNOSIS — F32A Depression, unspecified: Secondary | ICD-10-CM | POA: Diagnosis not present

## 2022-03-14 DIAGNOSIS — H9190 Unspecified hearing loss, unspecified ear: Secondary | ICD-10-CM | POA: Diagnosis not present

## 2022-03-14 DIAGNOSIS — N1832 Chronic kidney disease, stage 3b: Secondary | ICD-10-CM | POA: Diagnosis not present

## 2022-03-14 DIAGNOSIS — E039 Hypothyroidism, unspecified: Secondary | ICD-10-CM | POA: Diagnosis not present

## 2022-03-14 DIAGNOSIS — Z9181 History of falling: Secondary | ICD-10-CM | POA: Diagnosis not present

## 2022-03-15 DIAGNOSIS — E1122 Type 2 diabetes mellitus with diabetic chronic kidney disease: Secondary | ICD-10-CM | POA: Diagnosis not present

## 2022-03-15 DIAGNOSIS — I5023 Acute on chronic systolic (congestive) heart failure: Secondary | ICD-10-CM | POA: Diagnosis not present

## 2022-03-15 DIAGNOSIS — I4891 Unspecified atrial fibrillation: Secondary | ICD-10-CM | POA: Diagnosis not present

## 2022-03-15 DIAGNOSIS — G4700 Insomnia, unspecified: Secondary | ICD-10-CM | POA: Diagnosis not present

## 2022-03-15 DIAGNOSIS — N1832 Chronic kidney disease, stage 3b: Secondary | ICD-10-CM | POA: Diagnosis not present

## 2022-03-15 DIAGNOSIS — I130 Hypertensive heart and chronic kidney disease with heart failure and stage 1 through stage 4 chronic kidney disease, or unspecified chronic kidney disease: Secondary | ICD-10-CM | POA: Diagnosis not present

## 2022-03-19 DIAGNOSIS — I4891 Unspecified atrial fibrillation: Secondary | ICD-10-CM | POA: Diagnosis not present

## 2022-03-19 DIAGNOSIS — E1122 Type 2 diabetes mellitus with diabetic chronic kidney disease: Secondary | ICD-10-CM | POA: Diagnosis not present

## 2022-03-19 DIAGNOSIS — I130 Hypertensive heart and chronic kidney disease with heart failure and stage 1 through stage 4 chronic kidney disease, or unspecified chronic kidney disease: Secondary | ICD-10-CM | POA: Diagnosis not present

## 2022-03-19 DIAGNOSIS — I5023 Acute on chronic systolic (congestive) heart failure: Secondary | ICD-10-CM | POA: Diagnosis not present

## 2022-03-19 DIAGNOSIS — G4700 Insomnia, unspecified: Secondary | ICD-10-CM | POA: Diagnosis not present

## 2022-03-19 DIAGNOSIS — N1832 Chronic kidney disease, stage 3b: Secondary | ICD-10-CM | POA: Diagnosis not present

## 2022-03-26 DIAGNOSIS — I130 Hypertensive heart and chronic kidney disease with heart failure and stage 1 through stage 4 chronic kidney disease, or unspecified chronic kidney disease: Secondary | ICD-10-CM | POA: Diagnosis not present

## 2022-03-26 DIAGNOSIS — E1122 Type 2 diabetes mellitus with diabetic chronic kidney disease: Secondary | ICD-10-CM | POA: Diagnosis not present

## 2022-03-26 DIAGNOSIS — I5023 Acute on chronic systolic (congestive) heart failure: Secondary | ICD-10-CM | POA: Diagnosis not present

## 2022-03-26 DIAGNOSIS — I4891 Unspecified atrial fibrillation: Secondary | ICD-10-CM | POA: Diagnosis not present

## 2022-03-26 DIAGNOSIS — G4700 Insomnia, unspecified: Secondary | ICD-10-CM | POA: Diagnosis not present

## 2022-03-26 DIAGNOSIS — N1832 Chronic kidney disease, stage 3b: Secondary | ICD-10-CM | POA: Diagnosis not present

## 2022-03-27 DIAGNOSIS — G4700 Insomnia, unspecified: Secondary | ICD-10-CM | POA: Diagnosis not present

## 2022-03-27 DIAGNOSIS — N1832 Chronic kidney disease, stage 3b: Secondary | ICD-10-CM | POA: Diagnosis not present

## 2022-03-27 DIAGNOSIS — I5023 Acute on chronic systolic (congestive) heart failure: Secondary | ICD-10-CM | POA: Diagnosis not present

## 2022-03-27 DIAGNOSIS — I4891 Unspecified atrial fibrillation: Secondary | ICD-10-CM | POA: Diagnosis not present

## 2022-03-27 DIAGNOSIS — I130 Hypertensive heart and chronic kidney disease with heart failure and stage 1 through stage 4 chronic kidney disease, or unspecified chronic kidney disease: Secondary | ICD-10-CM | POA: Diagnosis not present

## 2022-03-27 DIAGNOSIS — E1122 Type 2 diabetes mellitus with diabetic chronic kidney disease: Secondary | ICD-10-CM | POA: Diagnosis not present

## 2022-03-29 DIAGNOSIS — N1832 Chronic kidney disease, stage 3b: Secondary | ICD-10-CM | POA: Diagnosis not present

## 2022-03-29 DIAGNOSIS — I130 Hypertensive heart and chronic kidney disease with heart failure and stage 1 through stage 4 chronic kidney disease, or unspecified chronic kidney disease: Secondary | ICD-10-CM | POA: Diagnosis not present

## 2022-03-29 DIAGNOSIS — E1122 Type 2 diabetes mellitus with diabetic chronic kidney disease: Secondary | ICD-10-CM | POA: Diagnosis not present

## 2022-03-29 DIAGNOSIS — G4700 Insomnia, unspecified: Secondary | ICD-10-CM | POA: Diagnosis not present

## 2022-03-29 DIAGNOSIS — I5023 Acute on chronic systolic (congestive) heart failure: Secondary | ICD-10-CM | POA: Diagnosis not present

## 2022-03-29 DIAGNOSIS — I4891 Unspecified atrial fibrillation: Secondary | ICD-10-CM | POA: Diagnosis not present

## 2022-04-01 DIAGNOSIS — E1122 Type 2 diabetes mellitus with diabetic chronic kidney disease: Secondary | ICD-10-CM | POA: Diagnosis not present

## 2022-04-01 DIAGNOSIS — I4891 Unspecified atrial fibrillation: Secondary | ICD-10-CM | POA: Diagnosis not present

## 2022-04-01 DIAGNOSIS — N1832 Chronic kidney disease, stage 3b: Secondary | ICD-10-CM | POA: Diagnosis not present

## 2022-04-01 DIAGNOSIS — G4700 Insomnia, unspecified: Secondary | ICD-10-CM | POA: Diagnosis not present

## 2022-04-01 DIAGNOSIS — I5023 Acute on chronic systolic (congestive) heart failure: Secondary | ICD-10-CM | POA: Diagnosis not present

## 2022-04-01 DIAGNOSIS — I130 Hypertensive heart and chronic kidney disease with heart failure and stage 1 through stage 4 chronic kidney disease, or unspecified chronic kidney disease: Secondary | ICD-10-CM | POA: Diagnosis not present

## 2022-04-02 DIAGNOSIS — I4891 Unspecified atrial fibrillation: Secondary | ICD-10-CM | POA: Diagnosis not present

## 2022-04-02 DIAGNOSIS — G4700 Insomnia, unspecified: Secondary | ICD-10-CM | POA: Diagnosis not present

## 2022-04-02 DIAGNOSIS — I130 Hypertensive heart and chronic kidney disease with heart failure and stage 1 through stage 4 chronic kidney disease, or unspecified chronic kidney disease: Secondary | ICD-10-CM | POA: Diagnosis not present

## 2022-04-02 DIAGNOSIS — I5023 Acute on chronic systolic (congestive) heart failure: Secondary | ICD-10-CM | POA: Diagnosis not present

## 2022-04-02 DIAGNOSIS — N1832 Chronic kidney disease, stage 3b: Secondary | ICD-10-CM | POA: Diagnosis not present

## 2022-04-02 DIAGNOSIS — E1122 Type 2 diabetes mellitus with diabetic chronic kidney disease: Secondary | ICD-10-CM | POA: Diagnosis not present

## 2022-04-04 DIAGNOSIS — N1832 Chronic kidney disease, stage 3b: Secondary | ICD-10-CM | POA: Diagnosis not present

## 2022-04-04 DIAGNOSIS — I4891 Unspecified atrial fibrillation: Secondary | ICD-10-CM | POA: Diagnosis not present

## 2022-04-04 DIAGNOSIS — E1122 Type 2 diabetes mellitus with diabetic chronic kidney disease: Secondary | ICD-10-CM | POA: Diagnosis not present

## 2022-04-04 DIAGNOSIS — G4700 Insomnia, unspecified: Secondary | ICD-10-CM | POA: Diagnosis not present

## 2022-04-04 DIAGNOSIS — I5023 Acute on chronic systolic (congestive) heart failure: Secondary | ICD-10-CM | POA: Diagnosis not present

## 2022-04-04 DIAGNOSIS — I130 Hypertensive heart and chronic kidney disease with heart failure and stage 1 through stage 4 chronic kidney disease, or unspecified chronic kidney disease: Secondary | ICD-10-CM | POA: Diagnosis not present

## 2022-04-05 DIAGNOSIS — E1122 Type 2 diabetes mellitus with diabetic chronic kidney disease: Secondary | ICD-10-CM | POA: Diagnosis not present

## 2022-04-05 DIAGNOSIS — I130 Hypertensive heart and chronic kidney disease with heart failure and stage 1 through stage 4 chronic kidney disease, or unspecified chronic kidney disease: Secondary | ICD-10-CM | POA: Diagnosis not present

## 2022-04-05 DIAGNOSIS — I4891 Unspecified atrial fibrillation: Secondary | ICD-10-CM | POA: Diagnosis not present

## 2022-04-05 DIAGNOSIS — I5023 Acute on chronic systolic (congestive) heart failure: Secondary | ICD-10-CM | POA: Diagnosis not present

## 2022-04-05 DIAGNOSIS — G4700 Insomnia, unspecified: Secondary | ICD-10-CM | POA: Diagnosis not present

## 2022-04-05 DIAGNOSIS — N1832 Chronic kidney disease, stage 3b: Secondary | ICD-10-CM | POA: Diagnosis not present

## 2022-04-09 DIAGNOSIS — N1832 Chronic kidney disease, stage 3b: Secondary | ICD-10-CM | POA: Diagnosis not present

## 2022-04-09 DIAGNOSIS — E1122 Type 2 diabetes mellitus with diabetic chronic kidney disease: Secondary | ICD-10-CM | POA: Diagnosis not present

## 2022-04-09 DIAGNOSIS — I4891 Unspecified atrial fibrillation: Secondary | ICD-10-CM | POA: Diagnosis not present

## 2022-04-09 DIAGNOSIS — I130 Hypertensive heart and chronic kidney disease with heart failure and stage 1 through stage 4 chronic kidney disease, or unspecified chronic kidney disease: Secondary | ICD-10-CM | POA: Diagnosis not present

## 2022-04-09 DIAGNOSIS — G4700 Insomnia, unspecified: Secondary | ICD-10-CM | POA: Diagnosis not present

## 2022-04-09 DIAGNOSIS — I5023 Acute on chronic systolic (congestive) heart failure: Secondary | ICD-10-CM | POA: Diagnosis not present

## 2022-04-12 DIAGNOSIS — I130 Hypertensive heart and chronic kidney disease with heart failure and stage 1 through stage 4 chronic kidney disease, or unspecified chronic kidney disease: Secondary | ICD-10-CM | POA: Diagnosis not present

## 2022-04-12 DIAGNOSIS — G4700 Insomnia, unspecified: Secondary | ICD-10-CM | POA: Diagnosis not present

## 2022-04-12 DIAGNOSIS — I5023 Acute on chronic systolic (congestive) heart failure: Secondary | ICD-10-CM | POA: Diagnosis not present

## 2022-04-12 DIAGNOSIS — N1832 Chronic kidney disease, stage 3b: Secondary | ICD-10-CM | POA: Diagnosis not present

## 2022-04-12 DIAGNOSIS — I4891 Unspecified atrial fibrillation: Secondary | ICD-10-CM | POA: Diagnosis not present

## 2022-04-12 DIAGNOSIS — E1122 Type 2 diabetes mellitus with diabetic chronic kidney disease: Secondary | ICD-10-CM | POA: Diagnosis not present

## 2022-04-13 DIAGNOSIS — Z9181 History of falling: Secondary | ICD-10-CM | POA: Diagnosis not present

## 2022-04-13 DIAGNOSIS — Z87440 Personal history of urinary (tract) infections: Secondary | ICD-10-CM | POA: Diagnosis not present

## 2022-04-13 DIAGNOSIS — R251 Tremor, unspecified: Secondary | ICD-10-CM | POA: Diagnosis not present

## 2022-04-13 DIAGNOSIS — N1832 Chronic kidney disease, stage 3b: Secondary | ICD-10-CM | POA: Diagnosis not present

## 2022-04-13 DIAGNOSIS — H3530 Unspecified macular degeneration: Secondary | ICD-10-CM | POA: Diagnosis not present

## 2022-04-13 DIAGNOSIS — I5023 Acute on chronic systolic (congestive) heart failure: Secondary | ICD-10-CM | POA: Diagnosis not present

## 2022-04-13 DIAGNOSIS — E039 Hypothyroidism, unspecified: Secondary | ICD-10-CM | POA: Diagnosis not present

## 2022-04-13 DIAGNOSIS — H9190 Unspecified hearing loss, unspecified ear: Secondary | ICD-10-CM | POA: Diagnosis not present

## 2022-04-13 DIAGNOSIS — G4700 Insomnia, unspecified: Secondary | ICD-10-CM | POA: Diagnosis not present

## 2022-04-13 DIAGNOSIS — Z515 Encounter for palliative care: Secondary | ICD-10-CM | POA: Diagnosis not present

## 2022-04-13 DIAGNOSIS — G2581 Restless legs syndrome: Secondary | ICD-10-CM | POA: Diagnosis not present

## 2022-04-13 DIAGNOSIS — F32A Depression, unspecified: Secondary | ICD-10-CM | POA: Diagnosis not present

## 2022-04-13 DIAGNOSIS — K219 Gastro-esophageal reflux disease without esophagitis: Secondary | ICD-10-CM | POA: Diagnosis not present

## 2022-04-13 DIAGNOSIS — I130 Hypertensive heart and chronic kidney disease with heart failure and stage 1 through stage 4 chronic kidney disease, or unspecified chronic kidney disease: Secondary | ICD-10-CM | POA: Diagnosis not present

## 2022-04-13 DIAGNOSIS — E1122 Type 2 diabetes mellitus with diabetic chronic kidney disease: Secondary | ICD-10-CM | POA: Diagnosis not present

## 2022-04-13 DIAGNOSIS — J189 Pneumonia, unspecified organism: Secondary | ICD-10-CM | POA: Diagnosis not present

## 2022-04-13 DIAGNOSIS — I4891 Unspecified atrial fibrillation: Secondary | ICD-10-CM | POA: Diagnosis not present

## 2022-04-13 DIAGNOSIS — E785 Hyperlipidemia, unspecified: Secondary | ICD-10-CM | POA: Diagnosis not present
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
## 2022-04-18 ENCOUNTER — Encounter: Payer: Self-pay | Admitting: Neurology

## 2022-04-18 ENCOUNTER — Ambulatory Visit (INDEPENDENT_AMBULATORY_CARE_PROVIDER_SITE_OTHER): Payer: Medicare Other | Admitting: Neurology

## 2022-04-18 VITALS — BP 137/70 | HR 107 | Ht 64.0 in | Wt 149.1 lb

## 2022-04-18 DIAGNOSIS — G250 Essential tremor: Secondary | ICD-10-CM

## 2022-04-18 DIAGNOSIS — F22 Delusional disorders: Secondary | ICD-10-CM

## 2022-04-18 NOTE — Patient Instructions (Signed)
For now, continue current medications We will refer for psychiatry evaluation

## 2022-04-19 DIAGNOSIS — N1832 Chronic kidney disease, stage 3b: Secondary | ICD-10-CM | POA: Diagnosis not present

## 2022-04-19 DIAGNOSIS — I4891 Unspecified atrial fibrillation: Secondary | ICD-10-CM | POA: Diagnosis not present

## 2022-04-19 DIAGNOSIS — E1122 Type 2 diabetes mellitus with diabetic chronic kidney disease: Secondary | ICD-10-CM | POA: Diagnosis not present

## 2022-04-19 DIAGNOSIS — I5023 Acute on chronic systolic (congestive) heart failure: Secondary | ICD-10-CM | POA: Diagnosis not present

## 2022-04-19 DIAGNOSIS — I130 Hypertensive heart and chronic kidney disease with heart failure and stage 1 through stage 4 chronic kidney disease, or unspecified chronic kidney disease: Secondary | ICD-10-CM | POA: Diagnosis not present

## 2022-04-19 DIAGNOSIS — G4700 Insomnia, unspecified: Secondary | ICD-10-CM | POA: Diagnosis not present

## 2022-04-23 DIAGNOSIS — E1122 Type 2 diabetes mellitus with diabetic chronic kidney disease: Secondary | ICD-10-CM | POA: Diagnosis not present

## 2022-04-23 DIAGNOSIS — I5023 Acute on chronic systolic (congestive) heart failure: Secondary | ICD-10-CM | POA: Diagnosis not present

## 2022-04-23 DIAGNOSIS — G4700 Insomnia, unspecified: Secondary | ICD-10-CM | POA: Diagnosis not present

## 2022-04-23 DIAGNOSIS — I4891 Unspecified atrial fibrillation: Secondary | ICD-10-CM | POA: Diagnosis not present

## 2022-04-23 DIAGNOSIS — N1832 Chronic kidney disease, stage 3b: Secondary | ICD-10-CM | POA: Diagnosis not present

## 2022-04-23 DIAGNOSIS — I130 Hypertensive heart and chronic kidney disease with heart failure and stage 1 through stage 4 chronic kidney disease, or unspecified chronic kidney disease: Secondary | ICD-10-CM | POA: Diagnosis not present

## 2022-04-24 DIAGNOSIS — E1122 Type 2 diabetes mellitus with diabetic chronic kidney disease: Secondary | ICD-10-CM | POA: Diagnosis not present

## 2022-04-24 DIAGNOSIS — G4700 Insomnia, unspecified: Secondary | ICD-10-CM | POA: Diagnosis not present

## 2022-04-24 DIAGNOSIS — I4891 Unspecified atrial fibrillation: Secondary | ICD-10-CM | POA: Diagnosis not present

## 2022-04-24 DIAGNOSIS — I5023 Acute on chronic systolic (congestive) heart failure: Secondary | ICD-10-CM | POA: Diagnosis not present

## 2022-04-24 DIAGNOSIS — N1832 Chronic kidney disease, stage 3b: Secondary | ICD-10-CM | POA: Diagnosis not present

## 2022-04-24 DIAGNOSIS — I130 Hypertensive heart and chronic kidney disease with heart failure and stage 1 through stage 4 chronic kidney disease, or unspecified chronic kidney disease: Secondary | ICD-10-CM | POA: Diagnosis not present

## 2022-04-25 DIAGNOSIS — N1832 Chronic kidney disease, stage 3b: Secondary | ICD-10-CM | POA: Diagnosis not present

## 2022-04-25 DIAGNOSIS — G4700 Insomnia, unspecified: Secondary | ICD-10-CM | POA: Diagnosis not present

## 2022-04-25 DIAGNOSIS — I4891 Unspecified atrial fibrillation: Secondary | ICD-10-CM | POA: Diagnosis not present

## 2022-04-25 DIAGNOSIS — E1122 Type 2 diabetes mellitus with diabetic chronic kidney disease: Secondary | ICD-10-CM | POA: Diagnosis not present

## 2022-04-25 DIAGNOSIS — I5023 Acute on chronic systolic (congestive) heart failure: Secondary | ICD-10-CM | POA: Diagnosis not present

## 2022-04-25 DIAGNOSIS — I130 Hypertensive heart and chronic kidney disease with heart failure and stage 1 through stage 4 chronic kidney disease, or unspecified chronic kidney disease: Secondary | ICD-10-CM | POA: Diagnosis not present

## 2022-04-26 DIAGNOSIS — I5023 Acute on chronic systolic (congestive) heart failure: Secondary | ICD-10-CM | POA: Diagnosis not present

## 2022-04-26 DIAGNOSIS — G4700 Insomnia, unspecified: Secondary | ICD-10-CM | POA: Diagnosis not present

## 2022-04-26 DIAGNOSIS — E1122 Type 2 diabetes mellitus with diabetic chronic kidney disease: Secondary | ICD-10-CM | POA: Diagnosis not present

## 2022-04-26 DIAGNOSIS — N1832 Chronic kidney disease, stage 3b: Secondary | ICD-10-CM | POA: Diagnosis not present

## 2022-04-26 DIAGNOSIS — I130 Hypertensive heart and chronic kidney disease with heart failure and stage 1 through stage 4 chronic kidney disease, or unspecified chronic kidney disease: Secondary | ICD-10-CM | POA: Diagnosis not present

## 2022-04-26 DIAGNOSIS — I4891 Unspecified atrial fibrillation: Secondary | ICD-10-CM | POA: Diagnosis not present

## 2022-05-01 DIAGNOSIS — N1832 Chronic kidney disease, stage 3b: Secondary | ICD-10-CM | POA: Diagnosis not present

## 2022-05-01 DIAGNOSIS — G4700 Insomnia, unspecified: Secondary | ICD-10-CM | POA: Diagnosis not present

## 2022-05-01 DIAGNOSIS — I4891 Unspecified atrial fibrillation: Secondary | ICD-10-CM | POA: Diagnosis not present

## 2022-05-01 DIAGNOSIS — E1122 Type 2 diabetes mellitus with diabetic chronic kidney disease: Secondary | ICD-10-CM | POA: Diagnosis not present

## 2022-05-01 DIAGNOSIS — I130 Hypertensive heart and chronic kidney disease with heart failure and stage 1 through stage 4 chronic kidney disease, or unspecified chronic kidney disease: Secondary | ICD-10-CM | POA: Diagnosis not present

## 2022-05-01 DIAGNOSIS — I5023 Acute on chronic systolic (congestive) heart failure: Secondary | ICD-10-CM | POA: Diagnosis not present

## 2022-05-03 DIAGNOSIS — I130 Hypertensive heart and chronic kidney disease with heart failure and stage 1 through stage 4 chronic kidney disease, or unspecified chronic kidney disease: Secondary | ICD-10-CM | POA: Diagnosis not present

## 2022-05-03 DIAGNOSIS — I5023 Acute on chronic systolic (congestive) heart failure: Secondary | ICD-10-CM | POA: Diagnosis not present

## 2022-05-03 DIAGNOSIS — N1832 Chronic kidney disease, stage 3b: Secondary | ICD-10-CM | POA: Diagnosis not present

## 2022-05-03 DIAGNOSIS — G4700 Insomnia, unspecified: Secondary | ICD-10-CM | POA: Diagnosis not present

## 2022-05-03 DIAGNOSIS — I4891 Unspecified atrial fibrillation: Secondary | ICD-10-CM | POA: Diagnosis not present

## 2022-05-03 DIAGNOSIS — E1122 Type 2 diabetes mellitus with diabetic chronic kidney disease: Secondary | ICD-10-CM | POA: Diagnosis not present

## 2022-05-07 DIAGNOSIS — I130 Hypertensive heart and chronic kidney disease with heart failure and stage 1 through stage 4 chronic kidney disease, or unspecified chronic kidney disease: Secondary | ICD-10-CM | POA: Diagnosis not present

## 2022-05-07 DIAGNOSIS — I4891 Unspecified atrial fibrillation: Secondary | ICD-10-CM | POA: Diagnosis not present

## 2022-05-07 DIAGNOSIS — E1122 Type 2 diabetes mellitus with diabetic chronic kidney disease: Secondary | ICD-10-CM | POA: Diagnosis not present

## 2022-05-07 DIAGNOSIS — G4700 Insomnia, unspecified: Secondary | ICD-10-CM | POA: Diagnosis not present

## 2022-05-07 DIAGNOSIS — I5023 Acute on chronic systolic (congestive) heart failure: Secondary | ICD-10-CM | POA: Diagnosis not present

## 2022-05-07 DIAGNOSIS — N1832 Chronic kidney disease, stage 3b: Secondary | ICD-10-CM | POA: Diagnosis not present

## 2022-05-08 DIAGNOSIS — I5023 Acute on chronic systolic (congestive) heart failure: Secondary | ICD-10-CM | POA: Diagnosis not present

## 2022-05-08 DIAGNOSIS — I4891 Unspecified atrial fibrillation: Secondary | ICD-10-CM | POA: Diagnosis not present

## 2022-05-08 DIAGNOSIS — N1832 Chronic kidney disease, stage 3b: Secondary | ICD-10-CM | POA: Diagnosis not present

## 2022-05-08 DIAGNOSIS — G4700 Insomnia, unspecified: Secondary | ICD-10-CM | POA: Diagnosis not present

## 2022-05-08 DIAGNOSIS — I130 Hypertensive heart and chronic kidney disease with heart failure and stage 1 through stage 4 chronic kidney disease, or unspecified chronic kidney disease: Secondary | ICD-10-CM | POA: Diagnosis not present

## 2022-05-08 DIAGNOSIS — E1122 Type 2 diabetes mellitus with diabetic chronic kidney disease: Secondary | ICD-10-CM | POA: Diagnosis not present

## 2022-05-10 DIAGNOSIS — I5023 Acute on chronic systolic (congestive) heart failure: Secondary | ICD-10-CM | POA: Diagnosis not present

## 2022-05-10 DIAGNOSIS — I130 Hypertensive heart and chronic kidney disease with heart failure and stage 1 through stage 4 chronic kidney disease, or unspecified chronic kidney disease: Secondary | ICD-10-CM | POA: Diagnosis not present

## 2022-05-10 DIAGNOSIS — E1122 Type 2 diabetes mellitus with diabetic chronic kidney disease: Secondary | ICD-10-CM | POA: Diagnosis not present

## 2022-05-10 DIAGNOSIS — G4700 Insomnia, unspecified: Secondary | ICD-10-CM | POA: Diagnosis not present

## 2022-05-10 DIAGNOSIS — N1832 Chronic kidney disease, stage 3b: Secondary | ICD-10-CM | POA: Diagnosis not present

## 2022-05-10 DIAGNOSIS — I4891 Unspecified atrial fibrillation: Secondary | ICD-10-CM | POA: Diagnosis not present

## 2022-05-14 DIAGNOSIS — J189 Pneumonia, unspecified organism: Secondary | ICD-10-CM | POA: Diagnosis not present

## 2022-05-14 DIAGNOSIS — K219 Gastro-esophageal reflux disease without esophagitis: Secondary | ICD-10-CM | POA: Diagnosis not present

## 2022-05-14 DIAGNOSIS — E1122 Type 2 diabetes mellitus with diabetic chronic kidney disease: Secondary | ICD-10-CM | POA: Diagnosis not present

## 2022-05-14 DIAGNOSIS — I130 Hypertensive heart and chronic kidney disease with heart failure and stage 1 through stage 4 chronic kidney disease, or unspecified chronic kidney disease: Secondary | ICD-10-CM | POA: Diagnosis not present

## 2022-05-14 DIAGNOSIS — Z515 Encounter for palliative care: Secondary | ICD-10-CM | POA: Diagnosis not present

## 2022-05-14 DIAGNOSIS — F32A Depression, unspecified: Secondary | ICD-10-CM | POA: Diagnosis not present

## 2022-05-14 DIAGNOSIS — H3530 Unspecified macular degeneration: Secondary | ICD-10-CM | POA: Diagnosis not present

## 2022-05-14 DIAGNOSIS — I4891 Unspecified atrial fibrillation: Secondary | ICD-10-CM | POA: Diagnosis not present

## 2022-05-14 DIAGNOSIS — Z87440 Personal history of urinary (tract) infections: Secondary | ICD-10-CM | POA: Diagnosis not present

## 2022-05-14 DIAGNOSIS — Z9181 History of falling: Secondary | ICD-10-CM | POA: Diagnosis not present

## 2022-05-14 DIAGNOSIS — G4700 Insomnia, unspecified: Secondary | ICD-10-CM | POA: Diagnosis not present

## 2022-05-14 DIAGNOSIS — N1832 Chronic kidney disease, stage 3b: Secondary | ICD-10-CM | POA: Diagnosis not present

## 2022-05-14 DIAGNOSIS — G2581 Restless legs syndrome: Secondary | ICD-10-CM | POA: Diagnosis not present

## 2022-05-14 DIAGNOSIS — E039 Hypothyroidism, unspecified: Secondary | ICD-10-CM | POA: Diagnosis not present

## 2022-05-14 DIAGNOSIS — E785 Hyperlipidemia, unspecified: Secondary | ICD-10-CM | POA: Diagnosis not present

## 2022-05-14 DIAGNOSIS — I5023 Acute on chronic systolic (congestive) heart failure: Secondary | ICD-10-CM | POA: Diagnosis not present

## 2022-05-14 DIAGNOSIS — H9190 Unspecified hearing loss, unspecified ear: Secondary | ICD-10-CM | POA: Diagnosis not present

## 2022-05-14 DIAGNOSIS — R251 Tremor, unspecified: Secondary | ICD-10-CM | POA: Diagnosis not present

## 2022-05-16 DIAGNOSIS — I130 Hypertensive heart and chronic kidney disease with heart failure and stage 1 through stage 4 chronic kidney disease, or unspecified chronic kidney disease: Secondary | ICD-10-CM | POA: Diagnosis not present

## 2022-05-16 DIAGNOSIS — I4891 Unspecified atrial fibrillation: Secondary | ICD-10-CM | POA: Diagnosis not present

## 2022-05-16 DIAGNOSIS — I5023 Acute on chronic systolic (congestive) heart failure: Secondary | ICD-10-CM | POA: Diagnosis not present

## 2022-05-16 DIAGNOSIS — G4700 Insomnia, unspecified: Secondary | ICD-10-CM | POA: Diagnosis not present

## 2022-05-16 DIAGNOSIS — E1122 Type 2 diabetes mellitus with diabetic chronic kidney disease: Secondary | ICD-10-CM | POA: Diagnosis not present

## 2022-05-16 DIAGNOSIS — N1832 Chronic kidney disease, stage 3b: Secondary | ICD-10-CM | POA: Diagnosis not present

## 2022-05-21 DIAGNOSIS — E1122 Type 2 diabetes mellitus with diabetic chronic kidney disease: Secondary | ICD-10-CM | POA: Diagnosis not present

## 2022-05-21 DIAGNOSIS — I130 Hypertensive heart and chronic kidney disease with heart failure and stage 1 through stage 4 chronic kidney disease, or unspecified chronic kidney disease: Secondary | ICD-10-CM | POA: Diagnosis not present

## 2022-05-21 DIAGNOSIS — G4700 Insomnia, unspecified: Secondary | ICD-10-CM | POA: Diagnosis not present

## 2022-05-21 DIAGNOSIS — I4891 Unspecified atrial fibrillation: Secondary | ICD-10-CM | POA: Diagnosis not present

## 2022-05-21 DIAGNOSIS — N1832 Chronic kidney disease, stage 3b: Secondary | ICD-10-CM | POA: Diagnosis not present

## 2022-05-21 DIAGNOSIS — I5023 Acute on chronic systolic (congestive) heart failure: Secondary | ICD-10-CM | POA: Diagnosis not present

## 2022-05-27 NOTE — Telephone Encounter (Signed)
Seems like encounter was open in error so closing encounter.  

## 2022-05-28 DIAGNOSIS — E1122 Type 2 diabetes mellitus with diabetic chronic kidney disease: Secondary | ICD-10-CM | POA: Diagnosis not present

## 2022-05-28 DIAGNOSIS — I4891 Unspecified atrial fibrillation: Secondary | ICD-10-CM | POA: Diagnosis not present

## 2022-05-28 DIAGNOSIS — I5023 Acute on chronic systolic (congestive) heart failure: Secondary | ICD-10-CM | POA: Diagnosis not present

## 2022-05-28 DIAGNOSIS — N1832 Chronic kidney disease, stage 3b: Secondary | ICD-10-CM | POA: Diagnosis not present

## 2022-05-28 DIAGNOSIS — I130 Hypertensive heart and chronic kidney disease with heart failure and stage 1 through stage 4 chronic kidney disease, or unspecified chronic kidney disease: Secondary | ICD-10-CM | POA: Diagnosis not present

## 2022-05-28 DIAGNOSIS — G4700 Insomnia, unspecified: Secondary | ICD-10-CM | POA: Diagnosis not present

## 2022-05-30 DIAGNOSIS — I4891 Unspecified atrial fibrillation: Secondary | ICD-10-CM | POA: Diagnosis not present

## 2022-05-30 DIAGNOSIS — E1122 Type 2 diabetes mellitus with diabetic chronic kidney disease: Secondary | ICD-10-CM | POA: Diagnosis not present

## 2022-05-30 DIAGNOSIS — N1832 Chronic kidney disease, stage 3b: Secondary | ICD-10-CM | POA: Diagnosis not present

## 2022-05-30 DIAGNOSIS — G4700 Insomnia, unspecified: Secondary | ICD-10-CM | POA: Diagnosis not present

## 2022-05-30 DIAGNOSIS — I130 Hypertensive heart and chronic kidney disease with heart failure and stage 1 through stage 4 chronic kidney disease, or unspecified chronic kidney disease: Secondary | ICD-10-CM | POA: Diagnosis not present

## 2022-05-30 DIAGNOSIS — I5023 Acute on chronic systolic (congestive) heart failure: Secondary | ICD-10-CM | POA: Diagnosis not present

## 2022-06-04 DIAGNOSIS — I130 Hypertensive heart and chronic kidney disease with heart failure and stage 1 through stage 4 chronic kidney disease, or unspecified chronic kidney disease: Secondary | ICD-10-CM | POA: Diagnosis not present

## 2022-06-04 DIAGNOSIS — I4891 Unspecified atrial fibrillation: Secondary | ICD-10-CM | POA: Diagnosis not present

## 2022-06-04 DIAGNOSIS — E1122 Type 2 diabetes mellitus with diabetic chronic kidney disease: Secondary | ICD-10-CM | POA: Diagnosis not present

## 2022-06-04 DIAGNOSIS — I5023 Acute on chronic systolic (congestive) heart failure: Secondary | ICD-10-CM | POA: Diagnosis not present

## 2022-06-04 DIAGNOSIS — N1832 Chronic kidney disease, stage 3b: Secondary | ICD-10-CM | POA: Diagnosis not present

## 2022-06-04 DIAGNOSIS — G4700 Insomnia, unspecified: Secondary | ICD-10-CM | POA: Diagnosis not present

## 2022-06-06 DIAGNOSIS — E1122 Type 2 diabetes mellitus with diabetic chronic kidney disease: Secondary | ICD-10-CM | POA: Diagnosis not present

## 2022-06-06 DIAGNOSIS — I5023 Acute on chronic systolic (congestive) heart failure: Secondary | ICD-10-CM | POA: Diagnosis not present

## 2022-06-06 DIAGNOSIS — I130 Hypertensive heart and chronic kidney disease with heart failure and stage 1 through stage 4 chronic kidney disease, or unspecified chronic kidney disease: Secondary | ICD-10-CM | POA: Diagnosis not present

## 2022-06-06 DIAGNOSIS — N1832 Chronic kidney disease, stage 3b: Secondary | ICD-10-CM | POA: Diagnosis not present

## 2022-06-06 DIAGNOSIS — I4891 Unspecified atrial fibrillation: Secondary | ICD-10-CM | POA: Diagnosis not present

## 2022-06-06 DIAGNOSIS — G4700 Insomnia, unspecified: Secondary | ICD-10-CM | POA: Diagnosis not present

## 2022-06-07 DIAGNOSIS — E1122 Type 2 diabetes mellitus with diabetic chronic kidney disease: Secondary | ICD-10-CM | POA: Diagnosis not present

## 2022-06-07 DIAGNOSIS — N1832 Chronic kidney disease, stage 3b: Secondary | ICD-10-CM | POA: Diagnosis not present

## 2022-06-07 DIAGNOSIS — I130 Hypertensive heart and chronic kidney disease with heart failure and stage 1 through stage 4 chronic kidney disease, or unspecified chronic kidney disease: Secondary | ICD-10-CM | POA: Diagnosis not present

## 2022-06-07 DIAGNOSIS — G4700 Insomnia, unspecified: Secondary | ICD-10-CM | POA: Diagnosis not present

## 2022-06-07 DIAGNOSIS — I4891 Unspecified atrial fibrillation: Secondary | ICD-10-CM | POA: Diagnosis not present

## 2022-06-07 DIAGNOSIS — I5023 Acute on chronic systolic (congestive) heart failure: Secondary | ICD-10-CM | POA: Diagnosis not present

## 2022-06-09 ENCOUNTER — Encounter (HOSPITAL_COMMUNITY): Payer: Self-pay | Admitting: Emergency Medicine

## 2022-06-09 ENCOUNTER — Other Ambulatory Visit: Payer: Self-pay

## 2022-06-09 ENCOUNTER — Emergency Department (HOSPITAL_COMMUNITY): Payer: Medicare Other

## 2022-06-09 ENCOUNTER — Inpatient Hospital Stay (HOSPITAL_COMMUNITY)
Admission: EM | Admit: 2022-06-09 | Discharge: 2022-06-21 | DRG: 193 | Disposition: A | Discharge status: Hospice | Payer: Medicare Other | Attending: Internal Medicine | Admitting: Internal Medicine

## 2022-06-09 DIAGNOSIS — K449 Diaphragmatic hernia without obstruction or gangrene: Secondary | ICD-10-CM | POA: Diagnosis present

## 2022-06-09 DIAGNOSIS — D631 Anemia in chronic kidney disease: Secondary | ICD-10-CM | POA: Diagnosis present

## 2022-06-09 DIAGNOSIS — Z823 Family history of stroke: Secondary | ICD-10-CM

## 2022-06-09 DIAGNOSIS — I129 Hypertensive chronic kidney disease with stage 1 through stage 4 chronic kidney disease, or unspecified chronic kidney disease: Secondary | ICD-10-CM | POA: Diagnosis not present

## 2022-06-09 DIAGNOSIS — Z79890 Hormone replacement therapy (postmenopausal): Secondary | ICD-10-CM

## 2022-06-09 DIAGNOSIS — E1140 Type 2 diabetes mellitus with diabetic neuropathy, unspecified: Secondary | ICD-10-CM

## 2022-06-09 DIAGNOSIS — J45909 Unspecified asthma, uncomplicated: Secondary | ICD-10-CM | POA: Diagnosis present

## 2022-06-09 DIAGNOSIS — R6520 Severe sepsis without septic shock: Secondary | ICD-10-CM | POA: Diagnosis present

## 2022-06-09 DIAGNOSIS — I493 Ventricular premature depolarization: Secondary | ICD-10-CM | POA: Diagnosis present

## 2022-06-09 DIAGNOSIS — J189 Pneumonia, unspecified organism: Secondary | ICD-10-CM | POA: Diagnosis not present

## 2022-06-09 DIAGNOSIS — J188 Other pneumonia, unspecified organism: Secondary | ICD-10-CM

## 2022-06-09 DIAGNOSIS — E8809 Other disorders of plasma-protein metabolism, not elsewhere classified: Secondary | ICD-10-CM | POA: Diagnosis present

## 2022-06-09 DIAGNOSIS — D649 Anemia, unspecified: Secondary | ICD-10-CM | POA: Diagnosis not present

## 2022-06-09 DIAGNOSIS — I081 Rheumatic disorders of both mitral and tricuspid valves: Secondary | ICD-10-CM | POA: Diagnosis present

## 2022-06-09 DIAGNOSIS — A419 Sepsis, unspecified organism: Secondary | ICD-10-CM | POA: Diagnosis not present

## 2022-06-09 DIAGNOSIS — G2581 Restless legs syndrome: Secondary | ICD-10-CM | POA: Diagnosis present

## 2022-06-09 DIAGNOSIS — Z87891 Personal history of nicotine dependence: Secondary | ICD-10-CM

## 2022-06-09 DIAGNOSIS — Z794 Long term (current) use of insulin: Secondary | ICD-10-CM | POA: Diagnosis not present

## 2022-06-09 DIAGNOSIS — E039 Hypothyroidism, unspecified: Secondary | ICD-10-CM | POA: Diagnosis present

## 2022-06-09 DIAGNOSIS — F0390 Unspecified dementia without behavioral disturbance: Secondary | ICD-10-CM | POA: Diagnosis present

## 2022-06-09 DIAGNOSIS — Z20822 Contact with and (suspected) exposure to covid-19: Secondary | ICD-10-CM | POA: Diagnosis present

## 2022-06-09 DIAGNOSIS — I428 Other cardiomyopathies: Secondary | ICD-10-CM | POA: Diagnosis present

## 2022-06-09 DIAGNOSIS — Z7901 Long term (current) use of anticoagulants: Secondary | ICD-10-CM

## 2022-06-09 DIAGNOSIS — I5022 Chronic systolic (congestive) heart failure: Secondary | ICD-10-CM | POA: Diagnosis not present

## 2022-06-09 DIAGNOSIS — Z515 Encounter for palliative care: Secondary | ICD-10-CM | POA: Diagnosis not present

## 2022-06-09 DIAGNOSIS — Z8249 Family history of ischemic heart disease and other diseases of the circulatory system: Secondary | ICD-10-CM

## 2022-06-09 DIAGNOSIS — Z8673 Personal history of transient ischemic attack (TIA), and cerebral infarction without residual deficits: Secondary | ICD-10-CM

## 2022-06-09 DIAGNOSIS — I130 Hypertensive heart and chronic kidney disease with heart failure and stage 1 through stage 4 chronic kidney disease, or unspecified chronic kidney disease: Secondary | ICD-10-CM | POA: Diagnosis present

## 2022-06-09 DIAGNOSIS — K219 Gastro-esophageal reflux disease without esophagitis: Secondary | ICD-10-CM | POA: Diagnosis present

## 2022-06-09 DIAGNOSIS — I5023 Acute on chronic systolic (congestive) heart failure: Secondary | ICD-10-CM

## 2022-06-09 DIAGNOSIS — N184 Chronic kidney disease, stage 4 (severe): Secondary | ICD-10-CM | POA: Diagnosis present

## 2022-06-09 DIAGNOSIS — E1142 Type 2 diabetes mellitus with diabetic polyneuropathy: Secondary | ICD-10-CM | POA: Diagnosis present

## 2022-06-09 DIAGNOSIS — J309 Allergic rhinitis, unspecified: Secondary | ICD-10-CM | POA: Diagnosis not present

## 2022-06-09 DIAGNOSIS — J122 Parainfluenza virus pneumonia: Secondary | ICD-10-CM | POA: Diagnosis present

## 2022-06-09 DIAGNOSIS — E1122 Type 2 diabetes mellitus with diabetic chronic kidney disease: Secondary | ICD-10-CM | POA: Diagnosis present

## 2022-06-09 DIAGNOSIS — Z79899 Other long term (current) drug therapy: Secondary | ICD-10-CM

## 2022-06-09 DIAGNOSIS — E7800 Pure hypercholesterolemia, unspecified: Secondary | ICD-10-CM | POA: Diagnosis present

## 2022-06-09 DIAGNOSIS — R0602 Shortness of breath: Secondary | ICD-10-CM | POA: Diagnosis not present

## 2022-06-09 DIAGNOSIS — N183 Chronic kidney disease, stage 3 unspecified: Secondary | ICD-10-CM

## 2022-06-09 DIAGNOSIS — Z743 Need for continuous supervision: Secondary | ICD-10-CM | POA: Diagnosis not present

## 2022-06-09 DIAGNOSIS — O85 Puerperal sepsis: Secondary | ICD-10-CM

## 2022-06-09 DIAGNOSIS — J9601 Acute respiratory failure with hypoxia: Secondary | ICD-10-CM

## 2022-06-09 DIAGNOSIS — R531 Weakness: Secondary | ICD-10-CM

## 2022-06-09 DIAGNOSIS — I480 Paroxysmal atrial fibrillation: Secondary | ICD-10-CM | POA: Diagnosis present

## 2022-06-09 DIAGNOSIS — R918 Other nonspecific abnormal finding of lung field: Secondary | ICD-10-CM | POA: Diagnosis not present

## 2022-06-09 DIAGNOSIS — R6889 Other general symptoms and signs: Secondary | ICD-10-CM | POA: Diagnosis not present

## 2022-06-09 DIAGNOSIS — R0902 Hypoxemia: Secondary | ICD-10-CM | POA: Diagnosis not present

## 2022-06-09 DIAGNOSIS — Z809 Family history of malignant neoplasm, unspecified: Secondary | ICD-10-CM

## 2022-06-09 DIAGNOSIS — E871 Hypo-osmolality and hyponatremia: Secondary | ICD-10-CM | POA: Diagnosis not present

## 2022-06-09 DIAGNOSIS — E1165 Type 2 diabetes mellitus with hyperglycemia: Secondary | ICD-10-CM

## 2022-06-09 DIAGNOSIS — R059 Cough, unspecified: Secondary | ICD-10-CM | POA: Diagnosis not present

## 2022-06-09 DIAGNOSIS — E875 Hyperkalemia: Secondary | ICD-10-CM | POA: Diagnosis not present

## 2022-06-09 LAB — CBC WITH DIFFERENTIAL/PLATELET
Abs Immature Granulocytes: 0.21 10*3/uL — ABNORMAL HIGH (ref 0.00–0.07)
Basophils Absolute: 0.1 10*3/uL (ref 0.0–0.1)
Basophils Relative: 1 %
Eosinophils Absolute: 0.4 10*3/uL (ref 0.0–0.5)
Eosinophils Relative: 3 %
HCT: 34.6 % — ABNORMAL LOW (ref 36.0–46.0)
Hemoglobin: 11 g/dL — ABNORMAL LOW (ref 12.0–15.0)
Immature Granulocytes: 2 %
Lymphocytes Relative: 19 %
Lymphs Abs: 2 10*3/uL (ref 0.7–4.0)
MCH: 26.3 pg (ref 26.0–34.0)
MCHC: 31.8 g/dL (ref 30.0–36.0)
MCV: 82.6 fL (ref 80.0–100.0)
Monocytes Absolute: 0.9 10*3/uL (ref 0.1–1.0)
Monocytes Relative: 9 %
Neutro Abs: 7.1 10*3/uL (ref 1.7–7.7)
Neutrophils Relative %: 66 %
Platelets: 459 10*3/uL — ABNORMAL HIGH (ref 150–400)
RBC: 4.19 MIL/uL (ref 3.87–5.11)
RDW: 13.7 % (ref 11.5–15.5)
WBC: 10.6 10*3/uL — ABNORMAL HIGH (ref 4.0–10.5)
nRBC: 0 % (ref 0.0–0.2)

## 2022-06-09 LAB — MAGNESIUM: Magnesium: 1.1 mg/dL — ABNORMAL LOW (ref 1.7–2.4)

## 2022-06-09 LAB — COMPREHENSIVE METABOLIC PANEL
ALT: 11 U/L (ref 0–44)
AST: 18 U/L (ref 15–41)
Albumin: 2.3 g/dL — ABNORMAL LOW (ref 3.5–5.0)
Alkaline Phosphatase: 86 U/L (ref 38–126)
Anion gap: 9 (ref 5–15)
BUN: 19 mg/dL (ref 8–23)
CO2: 28 mmol/L (ref 22–32)
Calcium: 8.2 mg/dL — ABNORMAL LOW (ref 8.9–10.3)
Chloride: 99 mmol/L (ref 98–111)
Creatinine, Ser: 1.74 mg/dL — ABNORMAL HIGH (ref 0.44–1.00)
GFR, Estimated: 27 mL/min — ABNORMAL LOW (ref >60)
Glucose, Bld: 170 mg/dL — ABNORMAL HIGH (ref 70–99)
Potassium: 4.1 mmol/L (ref 3.5–5.1)
Sodium: 136 mmol/L (ref 135–145)
Total Bilirubin: 0.1 mg/dL — ABNORMAL LOW (ref 0.3–1.2)
Total Protein: 7.4 g/dL (ref 6.5–8.1)

## 2022-06-09 LAB — PROTIME-INR
INR: 1.4 — ABNORMAL HIGH (ref 0.8–1.2)
Prothrombin Time: 17.4 seconds — ABNORMAL HIGH (ref 11.4–15.2)

## 2022-06-09 LAB — RESP PANEL BY RT-PCR (FLU A&B, COVID) ARPGX2
Influenza A by PCR: NEGATIVE
Influenza B by PCR: NEGATIVE
SARS Coronavirus 2 by RT PCR: NEGATIVE

## 2022-06-09 LAB — RETICULOCYTES
Immature Retic Fract: 17.5 % — ABNORMAL HIGH (ref 2.3–15.9)
RBC.: 4.24 MIL/uL (ref 3.87–5.11)
Retic Count, Absolute: 69.5 10*3/uL (ref 19.0–186.0)
Retic Ct Pct: 1.6 % (ref 0.4–3.1)

## 2022-06-09 LAB — FERRITIN: Ferritin: 337 ng/mL — ABNORMAL HIGH (ref 11–307)

## 2022-06-09 LAB — HEMOGLOBIN A1C
Hgb A1c MFr Bld: 8.5 % — ABNORMAL HIGH (ref 4.8–5.6)
Mean Plasma Glucose: 197.25 mg/dL

## 2022-06-09 LAB — CBG MONITORING, ED: Glucose-Capillary: 211 mg/dL — ABNORMAL HIGH (ref 70–99)

## 2022-06-09 LAB — PROCALCITONIN: Procalcitonin: <0.1 ng/mL

## 2022-06-09 LAB — LACTIC ACID, PLASMA
Lactic Acid, Venous: 1.2 mmol/L (ref 0.5–1.9)
Lactic Acid, Venous: 0.8 mmol/L (ref 0.5–1.9)

## 2022-06-09 LAB — TSH: TSH: 1.025 u[IU]/mL (ref 0.350–4.500)

## 2022-06-09 LAB — APTT: aPTT: 42 seconds — ABNORMAL HIGH (ref 24–36)

## 2022-06-09 LAB — BRAIN NATRIURETIC PEPTIDE: B Natriuretic Peptide: 77.6 pg/mL (ref 0.0–100.0)

## 2022-06-09 MED ORDER — LACTATED RINGERS IV BOLUS (SEPSIS): 1000.0000 mL | Freq: Once | INTRAVENOUS | Status: DC

## 2022-06-09 MED ORDER — GUAIFENESIN ER 600 MG PO TB12
600.0000 mg | ORAL_TABLET | Freq: Two times a day (BID) | ORAL | Status: DC | PRN
  Administered 2022-06-11: 600 mg via ORAL
  Filled 2022-06-09: qty 1

## 2022-06-09 MED ORDER — LORATADINE 10 MG PO TABS
10.0000 mg | ORAL_TABLET | Freq: Every day | ORAL | Status: DC
  Administered 2022-06-11 – 2022-06-21 (×11): 10 mg via ORAL
  Filled 2022-06-09 (×12): qty 1

## 2022-06-09 MED ORDER — LEVOTHYROXINE SODIUM 50 MCG PO TABS
50.0000 ug | ORAL_TABLET | Freq: Every day | ORAL | Status: DC
  Administered 2022-06-10 – 2022-06-21 (×12): 50 ug via ORAL
  Filled 2022-06-09 (×3): qty 1
  Filled 2022-06-09: qty 2
  Filled 2022-06-09 (×8): qty 1

## 2022-06-09 MED ORDER — INSULIN DETEMIR 100 UNIT/ML ~~LOC~~ SOLN
12.0000 [IU] | Freq: Every day | SUBCUTANEOUS | Status: DC
  Administered 2022-06-10 – 2022-06-20 (×12): 12 [IU] via SUBCUTANEOUS
  Filled 2022-06-09 (×14): qty 0.12

## 2022-06-09 MED ORDER — QUETIAPINE FUMARATE 100 MG PO TABS
100.0000 mg | ORAL_TABLET | Freq: Every day | ORAL | Status: DC
  Administered 2022-06-09 – 2022-06-20 (×12): 100 mg via ORAL
  Filled 2022-06-09 (×13): qty 1

## 2022-06-09 MED ORDER — ACETAMINOPHEN 650 MG RE SUPP: 650.0000 mg | Freq: Four times a day (QID) | RECTAL | Status: DC | PRN

## 2022-06-09 MED ORDER — SODIUM CHLORIDE 0.9 % IV SOLN
2.0000 g | INTRAVENOUS | Status: DC
  Administered 2022-06-09 – 2022-06-10 (×2): 2 g via INTRAVENOUS
  Filled 2022-06-09 (×2): qty 20

## 2022-06-09 MED ORDER — IPRATROPIUM-ALBUTEROL 0.5-2.5 (3) MG/3ML IN SOLN
3.0000 mL | Freq: Once | RESPIRATORY_TRACT | Status: AC
  Administered 2022-06-09: 3 mL via RESPIRATORY_TRACT
  Filled 2022-06-09: qty 3

## 2022-06-09 MED ORDER — ALBUTEROL SULFATE (2.5 MG/3ML) 0.083% IN NEBU: 2.5000 mg | INHALATION_SOLUTION | RESPIRATORY_TRACT | Status: DC | PRN

## 2022-06-09 MED ORDER — FLUOXETINE HCL 20 MG PO CAPS
20.0000 mg | ORAL_CAPSULE | Freq: Every day | ORAL | Status: DC
  Administered 2022-06-11 – 2022-06-21 (×11): 20 mg via ORAL
  Filled 2022-06-09 (×12): qty 1

## 2022-06-09 MED ORDER — PANTOPRAZOLE SODIUM 40 MG PO TBEC
40.0000 mg | DELAYED_RELEASE_TABLET | Freq: Two times a day (BID) | ORAL | Status: DC
  Administered 2022-06-10 – 2022-06-21 (×23): 40 mg via ORAL
  Filled 2022-06-09 (×23): qty 1

## 2022-06-09 MED ORDER — INSULIN ASPART 100 UNIT/ML IJ SOLN
0.0000 [IU] | Freq: Three times a day (TID) | INTRAMUSCULAR | Status: DC
  Administered 2022-06-10 (×2): 2 [IU] via SUBCUTANEOUS
  Administered 2022-06-11: 3 [IU] via SUBCUTANEOUS
  Administered 2022-06-11: 1 [IU] via SUBCUTANEOUS
  Administered 2022-06-12: 5 [IU] via SUBCUTANEOUS
  Administered 2022-06-12: 1 [IU] via SUBCUTANEOUS
  Administered 2022-06-13: 3 [IU] via SUBCUTANEOUS
  Administered 2022-06-13: 1 [IU] via SUBCUTANEOUS
  Administered 2022-06-13: 2 [IU] via SUBCUTANEOUS
  Administered 2022-06-14: 7 [IU] via SUBCUTANEOUS
  Administered 2022-06-14: 3 [IU] via SUBCUTANEOUS
  Administered 2022-06-15: 5 [IU] via SUBCUTANEOUS
  Administered 2022-06-15 – 2022-06-16 (×2): 1 [IU] via SUBCUTANEOUS
  Administered 2022-06-16: 2 [IU] via SUBCUTANEOUS
  Administered 2022-06-16: 3 [IU] via SUBCUTANEOUS
  Administered 2022-06-17: 1 [IU] via SUBCUTANEOUS
  Administered 2022-06-17: 2 [IU] via SUBCUTANEOUS
  Administered 2022-06-17 – 2022-06-18 (×2): 3 [IU] via SUBCUTANEOUS
  Administered 2022-06-18: 5 [IU] via SUBCUTANEOUS
  Administered 2022-06-18 – 2022-06-19 (×2): 1 [IU] via SUBCUTANEOUS
  Administered 2022-06-19 – 2022-06-20 (×3): 3 [IU] via SUBCUTANEOUS
  Administered 2022-06-20: 5 [IU] via SUBCUTANEOUS
  Administered 2022-06-20: 1 [IU] via SUBCUTANEOUS
  Administered 2022-06-21: 3 [IU] via SUBCUTANEOUS
  Administered 2022-06-21: 5 [IU] via SUBCUTANEOUS

## 2022-06-09 MED ORDER — SODIUM CHLORIDE 0.9 % IV SOLN
500.0000 mg | INTRAVENOUS | Status: DC
  Administered 2022-06-09 – 2022-06-10 (×2): 500 mg via INTRAVENOUS
  Filled 2022-06-09 (×2): qty 5

## 2022-06-09 MED ORDER — PRAMIPEXOLE DIHYDROCHLORIDE 0.25 MG PO TABS
0.5000 mg | ORAL_TABLET | Freq: Every evening | ORAL | Status: DC
  Administered 2022-06-10 – 2022-06-20 (×10): 0.5 mg via ORAL
  Filled 2022-06-09 (×13): qty 2

## 2022-06-09 MED ORDER — LACTATED RINGERS IV BOLUS
500.0000 mL | Freq: Once | INTRAVENOUS | Status: AC
  Administered 2022-06-09: 500 mL via INTRAVENOUS

## 2022-06-09 MED ORDER — ACETAMINOPHEN 325 MG PO TABS
650.0000 mg | ORAL_TABLET | Freq: Four times a day (QID) | ORAL | Status: DC | PRN
  Administered 2022-06-12 – 2022-06-21 (×3): 650 mg via ORAL
  Filled 2022-06-09 (×3): qty 2

## 2022-06-09 MED ORDER — LACTATED RINGERS IV SOLN: INTRAVENOUS | Status: AC

## 2022-06-09 MED ORDER — LACTATED RINGERS IV SOLN: INTRAVENOUS | Status: DC

## 2022-06-09 MED ORDER — APIXABAN 2.5 MG PO TABS
2.5000 mg | ORAL_TABLET | Freq: Two times a day (BID) | ORAL | Status: DC
  Administered 2022-06-10 – 2022-06-21 (×23): 2.5 mg via ORAL
  Filled 2022-06-09 (×24): qty 1

## 2022-06-09 NOTE — H&P (Signed)
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 chronically anticoagulated on Eliquis. Home AV nodal blocking regimen: None.  Presenting EKG reflects sinus rhythm with multiple PVCs, as further detailed above.  Per chart review, she underwent electrocardioversion in August 2022 at [facility].   Plan: monitor  strict I's & O's and daily weights. Repeat BMP/CBC in AM. Check serum mag level, particularly given finding of frequent PVCs.  Continue outpatient Eliquis.  Monitor on telemetry.        #) Allergic Rhinitis: documented h/o such, on scheduled Zyrtec as outpatient.    Plan: cont home Zyrtec.           #) Restless leg syndrome: Documented history of such, on Mirapex as an outpatient.  Plan: Continue home Mirapex.  Add on iron studies in the context of presenting decline in hemoglobin relative to baseline, as established above.       DVT prophylaxis: SCD's + plus continuation of home Eliquis Code Status: Full code (Confirmed per my discussions this evening with the  patient as well as her daughter, [name], who is present at bedside) Disposition Plan: Per Rounding Team Consults called: none;  Admission status: inpatient    PLEASE NOTE THAT DRAGON DICTATION SOFTWARE WAS USED IN THE CONSTRUCTION OF THIS NOTE.   [place] DO [facility]  From [place]   06/09/2022, 8:55 PM

## 2022-06-09 NOTE — ED Provider Notes (Signed)
This patient is a 88 year old female, she is currently receiving some palliative care services secondary to her failure to thrive, she has no prior history of oxygen requiring diseases though she does have reactive airway disease and asthma using inhalers as needed.  She presents to the hospital after having 1 month of worsening increasing weakness as well as increasing cough and on exam has some rales and was hypoxic to 87% on arrival.  She on exam has some mild tachypnea, on 2 L of oxygen comes up to 95%, she is normotensive, she is not febrile.  She does not have any significant amount of leukocytosis though she does have a chronic renal failure.  I personally looked at and interpreted the x-ray which show that she has multifocal pneumonia I agree with radiologist interpretation.  Medication management: Antibiotics added, oxygen given  Consultations: We will consult with hospitalist for admission  ED course: Patient improved with oxygen, antibiotics, nebulized treatment, will admit to inpatient status due to what appears to be pulmonary sepsis  Lactate and cultures pending  .Critical Care  Performed by: Noemi Chapel, MD Authorized by: Noemi Chapel, MD   Critical care provider statement:    Critical care time (minutes):  30   Critical care time was exclusive of:  Separately billable procedures and treating other patients and teaching time   Critical care was necessary to treat or prevent imminent or life-threatening deterioration of the following conditions:  Sepsis and respiratory failure   Critical care was time spent personally by me on the following activities:  Development of treatment plan with patient or surrogate, discussions with consultants, evaluation of patient's response to treatment, examination of patient, ordering and review of laboratory studies, ordering and review of radiographic studies, ordering and performing treatments and interventions, pulse oximetry, re-evaluation of  patient's condition, review of old charts and obtaining history from patient or surrogate   I assumed direction of critical care for this patient from another provider in my specialty: no     Care discussed with: admitting provider   Comments:        Final diagnoses:  Sepsis, due to unspecified organism, unspecified whether acute organ dysfunction present [facility])  Multifocal pneumonia  Puerperal sepsis with acute hypoxic respiratory failure, unspecified whether septic shock present ([place])  Chronic renal failure, stage 3 (moderate), unspecified whether stage 3a or 3b CKD ([place])      Noemi Chapel, MD 06/09/22 2226

## 2022-06-09 NOTE — ED Provider Notes (Signed)
[facility] EMERGENCY DEPARTMENT Provider Note   CSN: [id number] Arrival date & time: 06/09/22  1503     History  Chief Complaint  Patient presents with   Hypoxia   Cough    Melissa [name] is a 88 y.o. female.  Palliative care patient with a history of asthma, anxiety, CHF, CKD, type 2 diabetes, A-fib, stroke who presents to the ED via EMS for evaluation of 4 weeks of progressive weakness and cough.  Patient lives at home in an appointment with her daughter Melissa [name].  Typically ambulates with a walker, but has had increasing difficulty over the past 4 weeks.  Patient has home health nurses visit her twice per week, but otherwise daughter performs most of the care.  Reports cough to be productive of clear sputum.  Reports shortness of breath with activity, no shortness of breath at rest.  Patient states that she has bouts of nausea multiple times per day and typically vomits once to twice per day.  This has been ongoing for multiple months.  Patient reports oral intake to be decreased to 2 or less partial meals per day.  Patient states she just does not feel like eating.  Reports 8 pound unintentional weight loss over the past month.   Cough Associated symptoms: headaches and shortness of breath   Associated symptoms: no chest pain, no chills, no fever, no rhinorrhea and no sore throat        Home Medications Prior to Admission medications   Medication Sig Start Date End Date Taking? Authorizing Provider  acetaminophen (TYLENOL) 500 MG tablet Take 500-1,000 mg by mouth every 6 (six) hours as needed for headache (pain.).    [provider]  amoxicillin-clavulanate (AUGMENTIN) 875-125 MG tablet Take 1 tablet by mouth every 12 (twelve) hours. Patient not taking: Reported on 04/18/2022 12/31/21   Drenda Freeze, MD  atorvastatin (LIPITOR) 40 MG tablet TAKE 1 TABLET DAILY Patient not taking: Reported on 04/18/2022 01/07/22   Copland, Gay Filler, MD  blood glucose  meter kit and supplies KIT Dispense based on patient and insurance preference. Use up to four times daily as directed. 01/13/21   Copland, Gay Filler, MD  Blood Glucose Monitoring Suppl DEVI Dispense one meter to use and monitor glucose as needed 12/18/20   Copland, Gay Filler, MD  cephALEXin (KEFLEX) 250 MG capsule Take 1 capsule (250 mg total) by mouth 4 (four) times daily. 12/20/21   Daleen Bo, MD  cetirizine (ZYRTEC) 10 MG tablet Take 10 mg by mouth in the morning.    [provider]  Cholecalciferol (VITAMIN D3) 1000 UNITS CAPS Take 1,000 Units by mouth every evening.    [provider]  cyanocobalamin (,VITAMIN B-12,) 1000 MCG/ML injection Inject 1,000 mcg into the muscle once as needed (low B12 levels).    [provider]  ELIQUIS 2.5 MG TABS tablet TAKE 1 TABLET TWICE A DAY Patient taking differently: Take 2.5 mg by mouth See admin instructions. Take one tablet (2.5 mg) by mouth twice daily - with lunch and at bedtime 06/14/21   [name], MD  Fe Fum-FePoly-Vit C-Vit B3 (INTEGRA) 62.5-62.5-40-3 MG CAPS TAKE 1 CAPSULE DAILY Patient taking differently: Take 1 capsule by mouth daily with lunch. 07/16/21   Copland, Gay Filler, MD  FLUoxetine (PROZAC) 20 MG capsule Take 1 capsule (20 mg total) by mouth daily. Patient taking differently: Take 20 mg by mouth daily with breakfast. 10/17/21   Dohmeier, Asencion Partridge, MD  formoterol (PERFOROMIST) 20  MCG/2ML nebulizer solution Take 2 mLs (20 mcg total) by nebulization 2 (two) times daily. 03/06/20   Lauraine Rinne, NP  furosemide (LASIX) 40 MG tablet TAKE 1 TABLET DAILY Patient taking differently: Take 40 mg by mouth daily with breakfast. 07/16/21   Copland, Gay Filler, MD  HUMALOG KWIKPEN 100 UNIT/ML KwikPen Inject 6 units under the skin daily with supper. Patient taking differently: 6 Units daily with supper. 02/24/20   [name], MD  HYDROcodone-acetaminophen (NORCO/VICODIN) 5-325 MG tablet Take 1 tablet by mouth every 8 (eight) hours  as needed. Patient not taking: Reported on 04/18/2022 12/21/21   Rosemarie Ax, MD  insulin detemir (LEVEMIR FLEXTOUCH) 100 UNIT/ML FlexPen Inject 25 Units into the skin at bedtime. 05/23/21   [name], MD  levothyroxine (SYNTHROID) 50 MCG tablet TAKE 1 TABLET DAILY BEFORE BREAKFAST Patient taking differently: Take 50 mcg by mouth daily before breakfast. 06/27/21   Copland, Gay Filler, MD  metoprolol tartrate (LOPRESSOR) 50 MG tablet TAKE 1.5 TABLETS BY MOUTH 2 TIMES DAILY. Patient taking differently: Take 75 mg by mouth See admin instructions. Take 1 1/2 tablets (75 mg) by mouth twice daily - with lunch and at bedtime 11/12/21   [name], MD  Multiple Vitamins-Minerals (ICAPS AREDS 2) CAPS Take 1 capsule by mouth 2 (two) times daily with a meal. Patient not taking: Reported on 04/18/2022    [provider]  Omega-3 Fatty Acids (FISH OIL) 1000 MG CAPS Take 1,000 mg by mouth daily with supper.    [provider]  Jonetta Speak LANCETS 67H MISC Use to check blood sugar 4 times per day dx code E11.29 01/19/18   [name], MD  [facility] VERIO test strip USE AS INSTRUCTED TO CHECK BLOOD SUGAR 4 TIMES PER DAY 01/11/21   [name], MD  pantoprazole (PROTONIX) 40 MG tablet Take 1 tablet (40 mg total) by mouth 2 (two) times daily. Continue taking once a day after finishing the course of twice a day for  2 months Patient taking differently: Take 40 mg by mouth 2 (two) times daily with a meal. 04/10/21 12/31/21  Shelly Coss, MD  polyethylene glycol (MIRALAX / GLYCOLAX) 17 g packet Take 17 g by mouth daily as needed (constipation.). 05/23/21   [name], MD  pramipexole (MIRAPEX) 0.25 MG tablet Take 1 tablet (0.25 mg total) by mouth daily with supper. Take 1 tablet at 5 pm Patient taking differently: Take 0.5 mg by mouth at bedtime. 10/17/21   Dohmeier, Asencion Partridge, MD  pregabalin (LYRICA) 50 MG capsule Take 2 capsules (100 mg total) by mouth at bedtime. 10/05/21   Copland, Gay Filler,  MD  QUEtiapine (SEROQUEL) 50 MG tablet TAKE 2 TABLETS AT BEDTIME 02/04/22   Dohmeier, Asencion Partridge, MD  Respiratory Therapy Supplies (FLUTTER) DEVI 1 Device by Does not apply route as needed. 02/04/20   Lauraine Rinne, NP  revefenacin (YUPELRI) 175 MCG/3ML nebulizer solution Take 3 mLs (175 mcg total) by nebulization daily. 03/06/20   Lauraine Rinne, NP  Semaglutide,0.25 or 0.5MG /DOS, (OZEMPIC, 0.25 OR 0.5 MG/DOSE,) 2 MG/1.5ML SOPN Inject 0.5 mg into the skin every Monday. 10/08/21   Copland, Gay Filler, MD  senna (SENOKOT) 8.6 MG TABS tablet Take 2 tablets by mouth daily as needed (constipation).    [provider]  sucralfate (CARAFATE) 1 GM/10ML suspension Take 10 mLs (1 g total) by mouth 2 (two) times daily. Use as needed for stomach pain Patient taking differently: Take 1 g by mouth 2 (  two) times daily as needed (stomach pain). 05/08/21   Copland, Gay Filler, MD  sulfamethoxazole-trimethoprim (BACTRIM) 400-80 MG tablet Take 1 tablet by mouth 2 (two) times daily. Patient not taking: Reported on 12/31/2021 12/24/21   Copland, Gay Filler, MD  traMADol (ULTRAM) 50 MG tablet Take 50 mg by mouth every 6 (six) hours as needed.    [provider]  traZODone (DESYREL) 100 MG tablet Take 1 tablet (100 mg total) by mouth at bedtime. 10/17/21   Dohmeier, Asencion Partridge, MD  vitamin B-12 (CYANOCOBALAMIN) 1000 MCG tablet Take 1 tablet (1,000 mcg total) by mouth daily. Patient taking differently: Take 1,000 mcg by mouth daily with breakfast. 11/21/21   Copland, Gay Filler, MD      Allergies    Patient has no known allergies.    Review of Systems   Review of Systems  Constitutional:  Positive for activity change, appetite change and fatigue. Negative for chills and fever.  HENT:  Positive for congestion. Negative for rhinorrhea, sinus pressure, sinus pain and sore throat.   Respiratory:  Positive for cough and shortness of breath. Negative for chest tightness.   Cardiovascular:  Negative for chest pain,  palpitations and leg swelling.  Gastrointestinal:  Positive for nausea and vomiting. Negative for abdominal pain, constipation and diarrhea.  Genitourinary:  Negative for dysuria, frequency, hematuria and urgency.  Neurological:  Positive for headaches. Negative for dizziness.  All other systems reviewed and are negative.   Physical Exam Updated Vital Signs BP 114/85 (BP Location: Right Arm)   Pulse (!) 109   Temp 99.7 F (37.6 C) (Oral)   Resp 20   Ht 5' 4"  (1.626 m)   Wt 64.4 kg   SpO2 98%   BMI 24.37 kg/m  Physical Exam Vitals and nursing note reviewed.  Constitutional:      General: She is not in acute distress.    Appearance: Normal appearance. She is well-developed. She is not ill-appearing.  HENT:     Head: Normocephalic and atraumatic.     Mouth/Throat:     Mouth: Mucous membranes are moist.     Pharynx: Oropharynx is clear. No oropharyngeal exudate or posterior oropharyngeal erythema.  Eyes:     Extraocular Movements: Extraocular movements intact.     Conjunctiva/sclera: Conjunctivae normal.     Pupils: Pupils are equal, round, and reactive to light.  Cardiovascular:     Rate and Rhythm: Regular rhythm. Tachycardia present.     Pulses: Normal pulses.     Heart sounds: Normal heart sounds. No murmur heard. Pulmonary:     Effort: Pulmonary effort is normal. No accessory muscle usage, prolonged expiration or respiratory distress.     Breath sounds: Wheezing (End expiratory) and rales (Right upper lobe) present.  Abdominal:     General: Abdomen is flat. Bowel sounds are normal.     Palpations: Abdomen is soft.     Tenderness: There is no abdominal tenderness.  Musculoskeletal:        General: No swelling.     Cervical back: Neck supple.  Skin:    General: Skin is warm and dry.     Capillary Refill: Capillary refill takes less than 2 seconds.  Neurological:     Mental Status: She is alert.  Psychiatric:        Mood and Affect: Mood normal.     ED Results /  Procedures / Treatments   Labs (all labs ordered are listed, but only abnormal results are displayed) Labs Reviewed  COMPREHENSIVE  METABOLIC PANEL - Abnormal; Notable for the following components:      Result Value   Glucose, Bld 170 (*)    Creatinine, Ser 1.74 (*)    Calcium 8.2 (*)    Albumin 2.3 (*)    Total Bilirubin 0.1 (*)    GFR, Estimated 27 (*)    All other components within normal limits  CBC WITH DIFFERENTIAL/PLATELET - Abnormal; Notable for the following components:   WBC 10.6 (*)    Hemoglobin 11.0 (*)    HCT 34.6 (*)    Platelets 459 (*)    Abs Immature Granulocytes 0.21 (*)    All other components within normal limits  RESP PANEL BY RT-PCR (FLU A&B, COVID) ARPGX2  CULTURE, BLOOD (ROUTINE X 2)  CULTURE, BLOOD (ROUTINE X 2)  URINALYSIS, ROUTINE W REFLEX MICROSCOPIC  LACTIC ACID, PLASMA  LACTIC ACID, PLASMA  BRAIN NATRIURETIC PEPTIDE    EKG None  Radiology DG Chest Port 1 View  Result Date: 06/09/2022 CLINICAL DATA:  Shortness of breath.  Hypoxia. EXAM: PORTABLE CHEST 1 VIEW COMPARISON:  None Available. FINDINGS: Bilateral infiltrates, right greater than left, with a peripheral distribution. The heart, hila, mediastinum, lungs, and pleura are otherwise unremarkable. IMPRESSION: Bilateral pulmonary infiltrates worrisome for multifocal pneumonia. Recommend follow-up to complete resolution. Electronically Signed   By: Dorise Bullion III M.D.   On: 06/09/2022 16:32    Procedures .Critical Care  Performed by: Roylene Reason, PA-C Authorized by: Roylene Reason, PA-C   Critical care provider statement:    Critical care time (minutes):  34   Critical care was necessary to treat or prevent imminent or life-threatening deterioration of the following conditions:  Sepsis   Critical care was time spent personally by me on the following activities:  Development of treatment plan with patient or surrogate, discussions with consultants, evaluation of patient's  response to treatment, examination of patient, ordering and review of laboratory studies, ordering and review of radiographic studies, ordering and performing treatments and interventions, pulse oximetry, re-evaluation of patient's condition and review of old charts   I assumed direction of critical care for this patient from another provider in my specialty: no   Comments:     pneumosepsis     Medications Ordered in ED Medications - No data to display  ED Course/ Medical Decision Making/ A&P Clinical Course as of 06/09/22 1947  Sun Jun 09, 2022  1945 Updated patient on admission status.  Patient desires to be full code at this time. [AS]    Clinical Course User Index [AS] Roylene Reason, PA-C   This patient presents to the ED for concern of progressive consult weakness and cough, this involves an extensive number of treatment options, and is a complaint that carries with it a high risk of complications and morbidity.  The differential diagnosis includes The differential diagnosis of weakness includes but is not limited to neurologic causes (GBS, myasthenia gravis, CVA, MS, ALS, transverse myelitis, spinal cord injury, CVA, botulism, ) and other causes: ACS, Arrhythmia, syncope, orthostatic hypotension, sepsis, hypoglycemia, electrolyte disturbance, hypothyroidism, respiratory failure, symptomatic anemia, dehydration, heat injury, polypharmacy, malignancy.    Co morbidities that complicate the patient evaluation  Type 2 diabetes, history of pneumonia, asthma, CKD, CHF, hypertension, A-fib  Additional history obtained from: Nursing notes from this visit. Prior ED visit on 12/31/2021 for weakness Previous records within EMR system palliative care appointments on 01/01/2022 12/27/2021 Family daughter Melissa [name] is present provides portion of the history EMS who provides a  portion of the history  I ordered, reviewed and interpreted labs which include: CBC, CMP, respiratory panel, lactate,  BNP.  Slight leukocytosis of 10.6, creatinine 1.74 which is at baseline, elevated glucose at 170   I ordered imaging studies including chest x-ray I independently visualized and interpreted imaging which showed multifocal pneumonia I agree with the radiologist interpretation  Cardiac Monitoring:  The patient was maintained on a cardiac monitor.  I personally viewed and interpreted the cardiac monitored which showed an underlying rhythm of: NSR  Consultations Obtained:  I requested consultation with the hospitalist Dr. Velia Meyer,  and discussed lab and imaging findings as well as pertinent plan - they recommend: Admission for continued antibiotics, sepsis work-up  Afebrile, slightly tachycardic but otherwise hemodynamically stable.  Patient has multifocal pneumonia on chest x-ray with concurrent rales in the right upper lobe on auscultation.  Patient also has symptoms of generalized weakness.  She will be treated here for pneumonia. she also fits sepsis criteria due to tachycardia and hypoxia.  We will start Rocephin and azithromycin, rehydrate with lactated Ringer's, admit to medicine for further evaluation and management  Patient's case discussed with Dr. Sabra Heck who agrees with plan to admit for multifocal pneumonia and sepsis.  Note: Portions of this report may have been transcribed using voice recognition software. Every effort was made to ensure accuracy; however, inadvertent computerized transcription errors may still be present.                         Medical Decision Making Amount and/or Complexity of Data Reviewed Labs: ordered.          Final Clinical Impression(s) / ED Diagnoses Final diagnoses:  None    Rx / DC Orders ED Discharge Orders     None         Nehemiah Massed 06/09/22 2145    Noemi Chapel, MD 06/09/22 2226

## 2022-06-09 NOTE — ED Provider Triage Note (Signed)
Emergency Medicine Provider Triage Evaluation Note  Melissa [name] , a 88 y.o. female  was evaluated in triage.  Pt complains of worsening cough over the past 3 weeks. She describes having white phlegm over the past few weeks and increasing shortness of breath. Endorses palpitations. She endorses nausea and intermittent vomiting. Denies fevers, chest pain Per EMS patient found to be ~90% on room air. In triage 87% on room air, was placed on [facility] Review of Systems  Positive: See above Negative:   Physical Exam  BP 102/73 (BP Location: Right Arm)   Pulse (!) 111   Temp 98.4 F (36.9 C) (Oral)   Resp 18   Ht '5\' 4"'$  (1.626 m)   Wt 64.4 kg   SpO2 (S) (!) 87% Comment: pt placed on 2L [place]  BMI 24.37 kg/m  Gen:   Awake, no distress   Resp:  Tachypnea, rhonchi bilaterally  MSK:   Moves extremities without difficulty  Other:    Medical Decision Making  Medically screening exam initiated at 3:26 PM.  Appropriate orders placed.  [name] was informed that the remainder of the evaluation will be completed by another provider, this initial triage assessment does not replace that evaluation, and the importance of remaining in the ED until their evaluation is complete.  Will need expedited room for new oxygen requirement    Mickie Hillier, PA-C 06/09/22 1530

## 2022-06-09 NOTE — Sepsis Progress Note (Signed)
Elink following for Sepsis Protocol 

## 2022-06-09 NOTE — ED Triage Notes (Addendum)
Per ems, pt from home. Pt is a hospice pt. Pt has had a cough getting worse over the past 3-4 weeks, was 90% on RA. Pt doesn't wear O2 at baseline, placed on 3L [place] by ems and up to 97%. Hx of AFIB HR 53-110, BP 112/62. Pt 87% on room air in triage, placed on 2L [place] and up to 94%.

## 2022-06-10 DIAGNOSIS — Z20822 Contact with and (suspected) exposure to covid-19: Secondary | ICD-10-CM | POA: Diagnosis present

## 2022-06-10 DIAGNOSIS — E871 Hypo-osmolality and hyponatremia: Secondary | ICD-10-CM | POA: Diagnosis not present

## 2022-06-10 DIAGNOSIS — E1122 Type 2 diabetes mellitus with diabetic chronic kidney disease: Secondary | ICD-10-CM | POA: Diagnosis present

## 2022-06-10 DIAGNOSIS — I130 Hypertensive heart and chronic kidney disease with heart failure and stage 1 through stage 4 chronic kidney disease, or unspecified chronic kidney disease: Secondary | ICD-10-CM | POA: Diagnosis present

## 2022-06-10 DIAGNOSIS — E8809 Other disorders of plasma-protein metabolism, not elsewhere classified: Secondary | ICD-10-CM | POA: Diagnosis present

## 2022-06-10 DIAGNOSIS — J45909 Unspecified asthma, uncomplicated: Secondary | ICD-10-CM | POA: Diagnosis present

## 2022-06-10 DIAGNOSIS — R6520 Severe sepsis without septic shock: Secondary | ICD-10-CM | POA: Diagnosis present

## 2022-06-10 DIAGNOSIS — N184 Chronic kidney disease, stage 4 (severe): Secondary | ICD-10-CM | POA: Diagnosis present

## 2022-06-10 DIAGNOSIS — D631 Anemia in chronic kidney disease: Secondary | ICD-10-CM | POA: Diagnosis present

## 2022-06-10 DIAGNOSIS — G2581 Restless legs syndrome: Secondary | ICD-10-CM | POA: Diagnosis present

## 2022-06-10 DIAGNOSIS — I081 Rheumatic disorders of both mitral and tricuspid valves: Secondary | ICD-10-CM | POA: Diagnosis present

## 2022-06-10 DIAGNOSIS — J189 Pneumonia, unspecified organism: Secondary | ICD-10-CM | POA: Diagnosis present

## 2022-06-10 DIAGNOSIS — J9601 Acute respiratory failure with hypoxia: Secondary | ICD-10-CM | POA: Diagnosis present

## 2022-06-10 DIAGNOSIS — J122 Parainfluenza virus pneumonia: Secondary | ICD-10-CM | POA: Diagnosis present

## 2022-06-10 DIAGNOSIS — I480 Paroxysmal atrial fibrillation: Secondary | ICD-10-CM | POA: Diagnosis present

## 2022-06-10 DIAGNOSIS — E039 Hypothyroidism, unspecified: Secondary | ICD-10-CM | POA: Diagnosis present

## 2022-06-10 DIAGNOSIS — Z515 Encounter for palliative care: Secondary | ICD-10-CM | POA: Diagnosis not present

## 2022-06-10 DIAGNOSIS — F0390 Unspecified dementia without behavioral disturbance: Secondary | ICD-10-CM | POA: Diagnosis present

## 2022-06-10 DIAGNOSIS — I5022 Chronic systolic (congestive) heart failure: Secondary | ICD-10-CM | POA: Diagnosis present

## 2022-06-10 LAB — URINALYSIS, ROUTINE W REFLEX MICROSCOPIC
Bilirubin Urine: NEGATIVE
Glucose, UA: NEGATIVE mg/dL
Hgb urine dipstick: NEGATIVE
Ketones, ur: NEGATIVE mg/dL
Leukocytes,Ua: NEGATIVE
Nitrite: NEGATIVE
Protein, ur: NEGATIVE mg/dL
Specific Gravity, Urine: 1.012 (ref 1.005–1.030)
pH: 5 (ref 5.0–8.0)

## 2022-06-10 LAB — CBC WITH DIFFERENTIAL/PLATELET
Abs Immature Granulocytes: 0.17 10*3/uL — ABNORMAL HIGH (ref 0.00–0.07)
Basophils Absolute: 0.1 10*3/uL (ref 0.0–0.1)
Basophils Relative: 1 %
Eosinophils Absolute: 0.3 10*3/uL (ref 0.0–0.5)
Eosinophils Relative: 3 %
HCT: 29.5 % — ABNORMAL LOW (ref 36.0–46.0)
Hemoglobin: 9.1 g/dL — ABNORMAL LOW (ref 12.0–15.0)
Immature Granulocytes: 2 %
Lymphocytes Relative: 22 %
Lymphs Abs: 2 10*3/uL (ref 0.7–4.0)
MCH: 26.1 pg (ref 26.0–34.0)
MCHC: 30.8 g/dL (ref 30.0–36.0)
MCV: 84.5 fL (ref 80.0–100.0)
Monocytes Absolute: 0.9 10*3/uL (ref 0.1–1.0)
Monocytes Relative: 10 %
Neutro Abs: 5.9 10*3/uL (ref 1.7–7.7)
Neutrophils Relative %: 62 %
Platelets: 390 10*3/uL (ref 150–400)
RBC: 3.49 MIL/uL — ABNORMAL LOW (ref 3.87–5.11)
RDW: 14 % (ref 11.5–15.5)
WBC: 9.3 10*3/uL (ref 4.0–10.5)
nRBC: 0 % (ref 0.0–0.2)

## 2022-06-10 LAB — RESPIRATORY PANEL BY PCR

## 2022-06-10 LAB — COMPREHENSIVE METABOLIC PANEL
ALT: 11 U/L (ref 0–44)
AST: 13 U/L — ABNORMAL LOW (ref 15–41)
Albumin: 1.8 g/dL — ABNORMAL LOW (ref 3.5–5.0)
Alkaline Phosphatase: 61 U/L (ref 38–126)
Anion gap: 3 — ABNORMAL LOW (ref 5–15)
BUN: 16 mg/dL (ref 8–23)
CO2: 27 mmol/L (ref 22–32)
Calcium: 7.3 mg/dL — ABNORMAL LOW (ref 8.9–10.3)
Chloride: 105 mmol/L (ref 98–111)
Creatinine, Ser: 1.51 mg/dL — ABNORMAL HIGH (ref 0.44–1.00)
GFR, Estimated: 32 mL/min — ABNORMAL LOW (ref >60)
Glucose, Bld: 121 mg/dL — ABNORMAL HIGH (ref 70–99)
Potassium: 3.6 mmol/L (ref 3.5–5.1)
Sodium: 135 mmol/L (ref 135–145)
Total Bilirubin: 0.4 mg/dL (ref 0.3–1.2)
Total Protein: 5.6 g/dL — ABNORMAL LOW (ref 6.5–8.1)

## 2022-06-10 LAB — GLUCOSE, CAPILLARY: Glucose-Capillary: 156 mg/dL — ABNORMAL HIGH (ref 70–99)

## 2022-06-10 LAB — CBG MONITORING, ED
Glucose-Capillary: 148 mg/dL — ABNORMAL HIGH (ref 70–99)
Glucose-Capillary: 190 mg/dL — ABNORMAL HIGH (ref 70–99)

## 2022-06-10 LAB — PHOSPHORUS: Phosphorus: 3.3 mg/dL (ref 2.5–4.6)

## 2022-06-10 LAB — MAGNESIUM: Magnesium: 1 mg/dL — ABNORMAL LOW (ref 1.7–2.4)

## 2022-06-10 MED ORDER — POTASSIUM CHLORIDE CRYS ER 20 MEQ PO TBCR
40.0000 meq | EXTENDED_RELEASE_TABLET | Freq: Once | ORAL | Status: DC
  Filled 2022-06-10: qty 2

## 2022-06-10 MED ORDER — SENNOSIDES-DOCUSATE SODIUM 8.6-50 MG PO TABS
1.0000 | ORAL_TABLET | Freq: Every evening | ORAL | Status: DC | PRN
  Administered 2022-06-15 – 2022-06-18 (×3): 1 via ORAL
  Filled 2022-06-10 (×3): qty 1

## 2022-06-10 MED ORDER — METOPROLOL TARTRATE 5 MG/5ML IV SOLN
5.0000 mg | INTRAVENOUS | Status: DC | PRN
  Administered 2022-06-13 – 2022-06-19 (×4): 5 mg via INTRAVENOUS
  Filled 2022-06-10 (×4): qty 5

## 2022-06-10 MED ORDER — MAGNESIUM SULFATE 2 GM/50ML IV SOLN
2.0000 g | Freq: Once | INTRAVENOUS | Status: AC
  Administered 2022-06-10: 2 g via INTRAVENOUS
  Filled 2022-06-10: qty 50

## 2022-06-10 MED ORDER — IPRATROPIUM-ALBUTEROL 0.5-2.5 (3) MG/3ML IN SOLN
3.0000 mL | RESPIRATORY_TRACT | Status: DC | PRN
  Administered 2022-06-13: 3 mL via RESPIRATORY_TRACT
  Filled 2022-06-10 (×2): qty 3

## 2022-06-10 MED ORDER — IPRATROPIUM-ALBUTEROL 0.5-2.5 (3) MG/3ML IN SOLN
3.0000 mL | Freq: Four times a day (QID) | RESPIRATORY_TRACT | Status: DC
  Administered 2022-06-10 – 2022-06-11 (×6): 3 mL via RESPIRATORY_TRACT
  Filled 2022-06-10 (×5): qty 3

## 2022-06-10 MED ORDER — LACTATED RINGERS IV BOLUS: 500.0000 mL | Freq: Once | INTRAVENOUS | Status: DC

## 2022-06-10 MED ORDER — HYDRALAZINE HCL 20 MG/ML IJ SOLN: 10.0000 mg | INTRAMUSCULAR | Status: DC | PRN

## 2022-06-10 NOTE — ED Notes (Signed)
Floor nurse sandra reports that she is ready for the patient now
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
## 2022-06-10 NOTE — ED Notes (Signed)
Notified MD Howerter of low blood pressures. 500 LR bolus ordered at this time.

## 2022-06-10 NOTE — ED Notes (Signed)
Notified MD Shalhoub of continued pressures with MAP's less than 65. Patient not symptomatic at this time.

## 2022-06-10 NOTE — Progress Notes (Signed)
PROGRESS NOTE    Melissa [name]  LNL:[id number] DOB: 1927-11-14 DOA: 06/09/2022 PCP: Darreld Mclean, MD   Brief Narrative:  88 year old with history of chronic systolic CHF, stage IV CKD baseline creatinine 1.7, DM 2, paroxysmal A-fib on Eliquis, dementia comes to the hospital with concerns of multifocal pneumonia and generalized weakness.  Patient states started feeling weak about a week ago.  ED chest x-ray showed multifocal pneumonia, she was started on IV Rocephin and azithromycin.  BNP was negative.  COVID-19/flu-negative   Assessment & Plan:  Principal Problem:   Multifocal pneumonia Active Problems:   Type 2 diabetes mellitus with diabetic neuropathy, with long-term current use of insulin (HCC)   Paroxysmal atrial fibrillation (HCC)   Chronic systolic heart failure (HCC)   Acute anemia   RLS (restless legs syndrome)   Acute respiratory failure with hypoxia (HCC)   Generalized weakness   CKD (chronic kidney disease) stage 4, GFR 15-29 ml/min (HCC)   Allergic rhinitis    Acute respiratory distress and generalized weakness Multifocal pneumonia - BNP is normal, procalcitonin negative.  COVID/flu negative.  Will check respiratory panel.  Bronchodilators scheduled and as needed, I-S/flutter valve, supplemental oxygen as needed. Empiric IV Rocephin and azithromycin PT/OT She has been coughing quite a bit while in the room, concerns for possible aspiration as well.  We will keep her n.p.o.  Speech and swallow evaluation  Hypomagnesemia - Replete as needed  Chronic congestive heart failure with reduced EF, 20% - Echo July 2022 showed EF of 20%.  Continue home Lasix.  CKD stage IV - Baseline creatinine 1.7.  Diabetes mellitus type 2, insulin-dependent - Continue home Lantus 20 units daily.  Sliding scale and Accu-Cheks.  Paroxysmal A-fib - On Eliquis  Restless leg syndrome - On Mirapex  Anemia of chronic disease - Baseline hemoglobin 11.  Continue to  monitor   DVT prophylaxis: Eliquis Code Status: Full code Family Communication: Husband at bedside  Status is: Inpatient Remains inpatient appropriate because: Maintain hospital stay for further evaluation for bilateral pneumonia and shortness of breath    Subjective: Patient still short of breath and coughing quite a bit.  Per husband she had just vomited right before I saw her.  No other complaints.    Examination:  General exam: Appears calm and comfortable  Respiratory system: Mild rhonchi especially bases Cardiovascular system: S1 & S2 heard, RRR. No JVD, murmurs, rubs, gallops or clicks. No pedal edema. Gastrointestinal system: Abdomen is nondistended, soft and nontender. No organomegaly or masses felt. Normal bowel sounds heard. Central nervous system: Alert and oriented. No focal neurological deficits. Extremities: Symmetric 5 x 5 power. Skin: No rashes, lesions or ulcers Psychiatry: Judgement and insight appear normal. Mood & affect appropriate.     Objective: Vitals:   06/10/22 0700 06/10/22 0715 06/10/22 0730 06/10/22 0923  BP: (!) 114/93 (!) 109/53 98/86   Pulse: 84 92 92   Resp: '17 19 18   '$ Temp:    (!) 97.5 F (36.4 C)  TempSrc:    Oral  SpO2: 90% (!) 89% 91%   Weight:      Height:        Intake/Output Summary (Last 24 hours) at 06/10/2022 0926 Last data filed at 06/10/2022 0520 Gross per 24 hour  Intake 1485.92 ml  Output --  Net 1485.92 ml   Filed Weights   06/09/22 1522  Weight: 64.4 kg     Data Reviewed:   CBC: Recent Labs  Lab 06/09/22 1543 06/10/22 0408  WBC 10.6* 9.3  NEUTROABS 7.1 5.9  HGB 11.0* 9.1*  HCT 34.6* 29.5*  MCV 82.6 84.5  PLT 459* 182   Basic Metabolic Panel: Recent Labs  Lab 06/09/22 1543 06/10/22 0408  NA 136 135  K 4.1 3.6  CL 99 105  CO2 28 27  GLUCOSE 170* 121*  BUN 19 16  CREATININE 1.74* 1.51*  CALCIUM 8.2* 7.3*  MG 1.1* 1.0*  PHOS  --  3.3   GFR: Estimated Creatinine Clearance: 20.5 mL/min  (A) (by C-G formula based on SCr of 1.51 mg/dL (H)). Liver Function Tests: Recent Labs  Lab 06/09/22 1543 06/10/22 0408  AST 18 13*  ALT 11 11  ALKPHOS 86 61  BILITOT 0.1* 0.4  PROT 7.4 5.6*  ALBUMIN 2.3* 1.8*   No results for input(s): "LIPASE", "AMYLASE" in the last 168 hours. No results for input(s): "AMMONIA" in the last 168 hours. Coagulation Profile: Recent Labs  Lab 06/09/22 2027  INR 1.4*   Cardiac Enzymes: No results for input(s): "CKTOTAL", "CKMB", "CKMBINDEX", "TROPONINI" in the last 168 hours. BNP (last 3 results) No results for input(s): "PROBNP" in the last 8760 hours. HbA1C: Recent Labs    06/09/22 1543  HGBA1C 8.5*   CBG: Recent Labs  Lab 06/09/22 2210  GLUCAP 211*   Lipid Profile: No results for input(s): "CHOL", "HDL", "LDLCALC", "TRIG", "CHOLHDL", "LDLDIRECT" in the last 72 hours. Thyroid Function Tests: Recent Labs    06/09/22 1543  TSH 1.025   Anemia Panel: Recent Labs    06/09/22 1543  FERRITIN 337*  RETICCTPCT 1.6   Sepsis Labs: Recent Labs  Lab 06/09/22 1543 06/09/22 2008 06/09/22 2130  PROCALCITON <0.10  --   --   LATICACIDVEN  --  1.2 0.8    Recent Results (from the past 240 hour(s))  Resp Panel by RT-PCR (Flu A&B, Covid) Anterior Nasal Swab     Status: None   Collection Time: 06/09/22  3:31 PM   Specimen: Anterior Nasal Swab  Result Value Ref Range Status   SARS Coronavirus 2 by RT PCR NEGATIVE NEGATIVE Final    Comment: (NOTE) SARS-CoV-2 target nucleic acids are NOT DETECTED.  The SARS-CoV-2 RNA is generally detectable in upper respiratory specimens during the acute phase of infection. The lowest concentration of SARS-CoV-2 viral copies this assay can detect is 138 copies/mL. A negative result does not preclude SARS-Cov-2 infection and should not be used as the sole basis for treatment or other patient management decisions. A negative result may occur with  improper specimen collection/handling, submission of  specimen other than nasopharyngeal swab, presence of viral mutation(s) within the areas targeted by this assay, and inadequate number of viral copies(<138 copies/mL). A negative result must be combined with clinical observations, patient history, and epidemiological information. The expected result is Negative.  Fact Sheet for Patients:  EntrepreneurPulse.com.au  Fact Sheet for Healthcare Providers:  IncredibleEmployment.be  This test is no t yet approved or cleared by the [place] FDA and  has been authorized for detection and/or diagnosis of SARS-CoV-2 by FDA under an Emergency Use Authorization (EUA). This EUA will remain  in effect (meaning this test can be used) for the duration of the COVID-19 declaration under Section 564(b)(1) of the Act, 21 U.S.C.section 360bbb-3(b)(1), unless the authorization is terminated  or revoked sooner.       Influenza A by PCR NEGATIVE NEGATIVE Final   Influenza B by PCR NEGATIVE NEGATIVE Final    Comment: (NOTE) The Xpert Xpress SARS-CoV-2/FLU/RSV plus assay  is intended as an aid in the diagnosis of influenza from Nasopharyngeal swab specimens and should not be used as a sole basis for treatment. Nasal washings and aspirates are unacceptable for Xpert Xpress SARS-CoV-2/FLU/RSV testing.  Fact Sheet for Patients: EntrepreneurPulse.com.au  Fact Sheet for Healthcare Providers: IncredibleEmployment.be  This test is not yet approved or cleared by the [place] FDA and has been authorized for detection and/or diagnosis of SARS-CoV-2 by FDA under an Emergency Use Authorization (EUA). This EUA will remain in effect (meaning this test can be used) for the duration of the COVID-19 declaration under Section 564(b)(1) of the Act, 21 U.S.C. section 360bbb-3(b)(1), unless the authorization is terminated or revoked.  Performed at [facility], [place] [address],  [place], [place] [address]   Blood culture (routine x 2)     Status: None (Preliminary result)   Collection Time: 06/09/22  8:07 PM   Specimen: BLOOD  Result Value Ref Range Status   Specimen Description BLOOD SITE NOT SPECIFIED  Final   Special Requests   Final    BOTTLES DRAWN AEROBIC AND ANAEROBIC Blood Culture results may not be optimal due to an excessive volume of blood received in culture bottles   Culture   Final    NO GROWTH < 12 HOURS Performed at [facility], [place] [address], [place], [place] [address]    Report Status PENDING  Incomplete  Blood culture (routine x 2)     Status: None (Preliminary result)   Collection Time: 06/09/22  8:07 PM   Specimen: BLOOD  Result Value Ref Range Status   Specimen Description BLOOD SITE NOT SPECIFIED  Final   Special Requests   Final    BOTTLES DRAWN AEROBIC AND ANAEROBIC Blood Culture results may not be optimal due to an excessive volume of blood received in culture bottles   Culture   Final    NO GROWTH < 12 HOURS Performed at [facility], [place] [address] [place], [place] [address]    Report Status PENDING  Incomplete         Radiology Studies: DG Chest Port 1 View  Result Date: 06/09/2022 CLINICAL DATA:  Shortness of breath.  Hypoxia. EXAM: PORTABLE CHEST 1 VIEW COMPARISON:  None Available. FINDINGS: Bilateral infiltrates, right greater than left, with a peripheral distribution. The heart, hila, mediastinum, lungs, and pleura are otherwise unremarkable. IMPRESSION: Bilateral pulmonary infiltrates worrisome for multifocal pneumonia. Recommend follow-up to complete resolution. Electronically Signed   By: Dorise Bullion III M.D.   On: 06/09/2022 16:32        Scheduled Meds:  apixaban  2.5 mg Oral BID   FLUoxetine  20 mg Oral Q breakfast   insulin aspart  0-9 Units Subcutaneous TID WC   insulin detemir  12 Units Subcutaneous QHS   levothyroxine  50 mcg Oral Q0600   loratadine  10 mg Oral Daily   pantoprazole  40 mg  Oral BID WC   pramipexole  0.5 mg Oral QPM   QUEtiapine  100 mg Oral QHS   Continuous Infusions:  azithromycin Stopped (06/09/22 2315)   cefTRIAXone (ROCEPHIN)  IV Stopped (06/09/22 2119)     LOS: 1 day   Time spent= 35 mins    Ziyan Schoon Arsenio Loader, MD [facility]  If 7PM-7AM, please contact night-coverage  06/10/2022, 9:26 AM

## 2022-06-11 DIAGNOSIS — E8809 Other disorders of plasma-protein metabolism, not elsewhere classified: Secondary | ICD-10-CM | POA: Diagnosis present

## 2022-06-11 DIAGNOSIS — I5022 Chronic systolic (congestive) heart failure: Secondary | ICD-10-CM | POA: Diagnosis present

## 2022-06-11 DIAGNOSIS — I081 Rheumatic disorders of both mitral and tricuspid valves: Secondary | ICD-10-CM | POA: Diagnosis present

## 2022-06-11 DIAGNOSIS — I493 Ventricular premature depolarization: Secondary | ICD-10-CM | POA: Diagnosis present

## 2022-06-11 DIAGNOSIS — N184 Chronic kidney disease, stage 4 (severe): Secondary | ICD-10-CM | POA: Diagnosis present

## 2022-06-11 DIAGNOSIS — A419 Sepsis, unspecified organism: Secondary | ICD-10-CM | POA: Diagnosis not present

## 2022-06-11 DIAGNOSIS — E7800 Pure hypercholesterolemia, unspecified: Secondary | ICD-10-CM | POA: Diagnosis present

## 2022-06-11 DIAGNOSIS — E039 Hypothyroidism, unspecified: Secondary | ICD-10-CM | POA: Diagnosis present

## 2022-06-11 DIAGNOSIS — E1165 Type 2 diabetes mellitus with hyperglycemia: Secondary | ICD-10-CM | POA: Diagnosis not present

## 2022-06-11 DIAGNOSIS — G2581 Restless legs syndrome: Secondary | ICD-10-CM | POA: Diagnosis present

## 2022-06-11 DIAGNOSIS — F0390 Unspecified dementia without behavioral disturbance: Secondary | ICD-10-CM | POA: Diagnosis present

## 2022-06-11 DIAGNOSIS — R6520 Severe sepsis without septic shock: Secondary | ICD-10-CM | POA: Diagnosis present

## 2022-06-11 DIAGNOSIS — E1140 Type 2 diabetes mellitus with diabetic neuropathy, unspecified: Secondary | ICD-10-CM | POA: Diagnosis not present

## 2022-06-11 DIAGNOSIS — J9601 Acute respiratory failure with hypoxia: Secondary | ICD-10-CM | POA: Diagnosis present

## 2022-06-11 DIAGNOSIS — Z20822 Contact with and (suspected) exposure to covid-19: Secondary | ICD-10-CM | POA: Diagnosis present

## 2022-06-11 DIAGNOSIS — I130 Hypertensive heart and chronic kidney disease with heart failure and stage 1 through stage 4 chronic kidney disease, or unspecified chronic kidney disease: Secondary | ICD-10-CM | POA: Diagnosis present

## 2022-06-11 DIAGNOSIS — J122 Parainfluenza virus pneumonia: Secondary | ICD-10-CM | POA: Diagnosis present

## 2022-06-11 DIAGNOSIS — I480 Paroxysmal atrial fibrillation: Secondary | ICD-10-CM | POA: Diagnosis present

## 2022-06-11 DIAGNOSIS — D631 Anemia in chronic kidney disease: Secondary | ICD-10-CM | POA: Diagnosis present

## 2022-06-11 DIAGNOSIS — J189 Pneumonia, unspecified organism: Secondary | ICD-10-CM | POA: Diagnosis not present

## 2022-06-11 DIAGNOSIS — K219 Gastro-esophageal reflux disease without esophagitis: Secondary | ICD-10-CM | POA: Diagnosis present

## 2022-06-11 DIAGNOSIS — Z515 Encounter for palliative care: Secondary | ICD-10-CM | POA: Diagnosis not present

## 2022-06-11 DIAGNOSIS — E1122 Type 2 diabetes mellitus with diabetic chronic kidney disease: Secondary | ICD-10-CM | POA: Diagnosis present

## 2022-06-11 DIAGNOSIS — R531 Weakness: Secondary | ICD-10-CM | POA: Diagnosis not present

## 2022-06-11 DIAGNOSIS — D649 Anemia, unspecified: Secondary | ICD-10-CM | POA: Diagnosis not present

## 2022-06-11 DIAGNOSIS — E871 Hypo-osmolality and hyponatremia: Secondary | ICD-10-CM | POA: Diagnosis not present

## 2022-06-11 DIAGNOSIS — J45909 Unspecified asthma, uncomplicated: Secondary | ICD-10-CM | POA: Diagnosis present

## 2022-06-11 DIAGNOSIS — E1142 Type 2 diabetes mellitus with diabetic polyneuropathy: Secondary | ICD-10-CM | POA: Diagnosis present

## 2022-06-11 DIAGNOSIS — Z794 Long term (current) use of insulin: Secondary | ICD-10-CM | POA: Diagnosis not present

## 2022-06-11 DIAGNOSIS — I428 Other cardiomyopathies: Secondary | ICD-10-CM | POA: Diagnosis present

## 2022-06-11 DIAGNOSIS — K449 Diaphragmatic hernia without obstruction or gangrene: Secondary | ICD-10-CM | POA: Diagnosis not present

## 2022-06-11 LAB — GLUCOSE, CAPILLARY
Glucose-Capillary: 96 mg/dL (ref 70–99)
Glucose-Capillary: 139 mg/dL — ABNORMAL HIGH (ref 70–99)
Glucose-Capillary: 202 mg/dL — ABNORMAL HIGH (ref 70–99)
Glucose-Capillary: 154 mg/dL — ABNORMAL HIGH (ref 70–99)

## 2022-06-11 LAB — BASIC METABOLIC PANEL
Anion gap: 7 (ref 5–15)
BUN: 9 mg/dL (ref 8–23)
CO2: 25 mmol/L (ref 22–32)
Calcium: 8.1 mg/dL — ABNORMAL LOW (ref 8.9–10.3)
Chloride: 103 mmol/L (ref 98–111)
Creatinine, Ser: 1.2 mg/dL — ABNORMAL HIGH (ref 0.44–1.00)
GFR, Estimated: 42 mL/min — ABNORMAL LOW (ref >60)
Glucose, Bld: 96 mg/dL (ref 70–99)
Potassium: 4 mmol/L (ref 3.5–5.1)
Sodium: 135 mmol/L (ref 135–145)

## 2022-06-11 LAB — CBC
HCT: 29.2 % — ABNORMAL LOW (ref 36.0–46.0)
Hemoglobin: 9.1 g/dL — ABNORMAL LOW (ref 12.0–15.0)
MCH: 26.1 pg (ref 26.0–34.0)
MCHC: 31.2 g/dL (ref 30.0–36.0)
MCV: 83.7 fL (ref 80.0–100.0)
Platelets: 385 10*3/uL (ref 150–400)
RBC: 3.49 MIL/uL — ABNORMAL LOW (ref 3.87–5.11)
RDW: 13.9 % (ref 11.5–15.5)
WBC: 9.5 10*3/uL (ref 4.0–10.5)
nRBC: 0 % (ref 0.0–0.2)

## 2022-06-11 LAB — URINE CULTURE: Culture: NO GROWTH

## 2022-06-11 LAB — MAGNESIUM: Magnesium: 1.5 mg/dL — ABNORMAL LOW (ref 1.7–2.4)

## 2022-06-11 LAB — PHOSPHORUS: Phosphorus: 3.2 mg/dL (ref 2.5–4.6)

## 2022-06-11 MED ORDER — ONDANSETRON HCL 4 MG/2ML IJ SOLN
4.0000 mg | Freq: Four times a day (QID) | INTRAMUSCULAR | Status: DC | PRN
  Administered 2022-06-11: 4 mg via INTRAVENOUS
  Filled 2022-06-11: qty 2

## 2022-06-11 MED ORDER — IPRATROPIUM-ALBUTEROL 0.5-2.5 (3) MG/3ML IN SOLN
3.0000 mL | Freq: Two times a day (BID) | RESPIRATORY_TRACT | Status: DC
  Administered 2022-06-12: 3 mL via RESPIRATORY_TRACT
  Filled 2022-06-11: qty 3

## 2022-06-11 MED ORDER — MAGNESIUM SULFATE 2 GM/50ML IV SOLN
2.0000 g | Freq: Once | INTRAVENOUS | Status: AC
  Administered 2022-06-11: 2 g via INTRAVENOUS
  Filled 2022-06-11: qty 50

## 2022-06-11 NOTE — Progress Notes (Signed)
PROGRESS NOTE    Melissa [name]  BJS:[id number] DOB: 09/21/1928 DOA: 06/09/2022 PCP: Darreld Mclean, MD   Brief Narrative:  88 year old with history of chronic systolic CHF, stage IV CKD baseline creatinine 1.7, DM 2, paroxysmal A-fib on Eliquis, dementia comes to the hospital with concerns of multifocal pneumonia and generalized weakness.  Patient states started feeling weak about a week ago.  ED chest x-ray showed multifocal pneumonia, she was started on IV Rocephin and azithromycin.  BNP was negative.  COVID-19/flu-negative due to concerns of aspiration she was seen by speech therapist and eventually cleared for regular diet.   Assessment & Plan:  Principal Problem:   Multifocal pneumonia Active Problems:   Type 2 diabetes mellitus with diabetic neuropathy, with long-term current use of insulin (HCC)   Paroxysmal atrial fibrillation (HCC)   Chronic systolic heart failure (HCC)   Acute anemia   RLS (restless legs syndrome)   Acute respiratory failure with hypoxia (HCC)   Generalized weakness   CKD (chronic kidney disease) stage 4, GFR 15-29 ml/min (HCC)   Allergic rhinitis    Acute respiratory distress and generalized weakness Multifocal pneumonia secondary to parainfluenza - BNP is normal, procalcitonin negative.  COVID/flu negative.  Bronchodilators scheduled and as needed, I-S/flutter valve.  Respiratory panel positive for parainfluenza.  Will discontinue antibiotics. PT/OT  Hypomagnesemia - Replete as needed  Nausea and Vomiting -Will order Esophogram   Chronic congestive heart failure with reduced EF, 20% - Echo July 2022 showed EF of 20%.  Continue home Lasix.  CKD stage IV - Baseline creatinine 1.3.  Diabetes mellitus type 2, insulin-dependent - Continue home Lantus 20 units daily.  Sliding scale and Accu-Cheks.  Paroxysmal A-fib - On Eliquis  Restless leg syndrome - On Mirapex  Anemia of chronic disease - Baseline hemoglobin 11.  Continue to  monitor  PT/OT  DVT prophylaxis: Eliquis Code Status: Full code Family Communication: Daughter at bedside.   Status is: Inpatient Remains inpatient appropriate because:   Subjective: Feels better. But very nausous with vomiting.   Examination: Constitutional: Not in acute distress; elderly frail  Respiratory: Clear to auscultation bilaterally Cardiovascular: Normal sinus rhythm, no rubs Abdomen: Nontender nondistended good bowel sounds Musculoskeletal: No edema noted Skin: No rashes seen Neurologic: CN 2-12 grossly intact.  And nonfocal Psychiatric: Normal judgment and insight. Alert and oriented x 3. Normal mood.    Objective: Vitals:   06/10/22 2115 06/11/22 0000 06/11/22 0252 06/11/22 0354  BP:  (!) 109/93  117/72  Pulse: 99 96 (!) 106 87  Resp: '20 16 18 19  '$ Temp:  97.9 F (36.6 C)  98.1 F (36.7 C)  TempSrc:  Oral  Oral  SpO2: 95% 98% 92% 95%  Weight:      Height:        Intake/Output Summary (Last 24 hours) at 06/11/2022 0804 Last data filed at 06/11/2022 0300 Gross per 24 hour  Intake 590.5 ml  Output --  Net 590.5 ml   Filed Weights   06/09/22 1522  Weight: 64.4 kg     Data Reviewed:   CBC: Recent Labs  Lab 06/09/22 1543 06/10/22 0408 06/11/22 0135  WBC 10.6* 9.3 9.5  NEUTROABS 7.1 5.9  --   HGB 11.0* 9.1* 9.1*  HCT 34.6* 29.5* 29.2*  MCV 82.6 84.5 83.7  PLT 459* 390 607   Basic Metabolic Panel: Recent Labs  Lab 06/09/22 1543 06/10/22 0408 06/11/22 0135  NA 136 135 135  K 4.1 3.6 4.0  CL 99 105  103  CO2 '28 27 25  '$ GLUCOSE 170* 121* 96  BUN '19 16 9  '$ CREATININE 1.74* 1.51* 1.20*  CALCIUM 8.2* 7.3* 8.1*  MG 1.1* 1.0* 1.5*  PHOS  --  3.3 3.2   GFR: Estimated Creatinine Clearance: 25.8 mL/min (A) (by C-G formula based on SCr of 1.2 mg/dL (H)). Liver Function Tests: Recent Labs  Lab 06/09/22 1543 06/10/22 0408  AST 18 13*  ALT 11 11  ALKPHOS 86 61  BILITOT 0.1* 0.4  PROT 7.4 5.6*  ALBUMIN 2.3* 1.8*   No results for  input(s): "LIPASE", "AMYLASE" in the last 168 hours. No results for input(s): "AMMONIA" in the last 168 hours. Coagulation Profile: Recent Labs  Lab 06/09/22 2027  INR 1.4*   Cardiac Enzymes: No results for input(s): "CKTOTAL", "CKMB", "CKMBINDEX", "TROPONINI" in the last 168 hours. BNP (last 3 results) No results for input(s): "PROBNP" in the last 8760 hours. HbA1C: Recent Labs    06/09/22 1543  HGBA1C 8.5*   CBG: Recent Labs  Lab 06/09/22 2210 06/10/22 1121 06/10/22 1619 06/10/22 2049  GLUCAP 211* 148* 190* 156*   Lipid Profile: No results for input(s): "CHOL", "HDL", "LDLCALC", "TRIG", "CHOLHDL", "LDLDIRECT" in the last 72 hours. Thyroid Function Tests: Recent Labs    06/09/22 1543  TSH 1.025   Anemia Panel: Recent Labs    06/09/22 1543  FERRITIN 337*  RETICCTPCT 1.6   Sepsis Labs: Recent Labs  Lab 06/09/22 1543 06/09/22 2008 06/09/22 2130  PROCALCITON <0.10  --   --   LATICACIDVEN  --  1.2 0.8    Recent Results (from the past 240 hour(s))  Resp Panel by RT-PCR (Flu A&B, Covid) Anterior Nasal Swab     Status: None   Collection Time: 06/09/22  3:31 PM   Specimen: Anterior Nasal Swab  Result Value Ref Range Status   SARS Coronavirus 2 by RT PCR NEGATIVE NEGATIVE Final    Comment: (NOTE) SARS-CoV-2 target nucleic acids are NOT DETECTED.  The SARS-CoV-2 RNA is generally detectable in upper respiratory specimens during the acute phase of infection. The lowest concentration of SARS-CoV-2 viral copies this assay can detect is 138 copies/mL. A negative result does not preclude SARS-Cov-2 infection and should not be used as the sole basis for treatment or other patient management decisions. A negative result may occur with  improper specimen collection/handling, submission of specimen other than nasopharyngeal swab, presence of viral mutation(s) within the areas targeted by this assay, and inadequate number of viral copies(<138 copies/mL). A negative  result must be combined with clinical observations, patient history, and epidemiological information. The expected result is Negative.  Fact Sheet for Patients:  EntrepreneurPulse.com.au  Fact Sheet for Healthcare Providers:  IncredibleEmployment.be  This test is no t yet approved or cleared by the [place] FDA and  has been authorized for detection and/or diagnosis of SARS-CoV-2 by FDA under an Emergency Use Authorization (EUA). This EUA will remain  in effect (meaning this test can be used) for the duration of the COVID-19 declaration under Section 564(b)(1) of the Act, 21 U.S.C.section 360bbb-3(b)(1), unless the authorization is terminated  or revoked sooner.       Influenza A by PCR NEGATIVE NEGATIVE Final   Influenza B by PCR NEGATIVE NEGATIVE Final    Comment: (NOTE) The Xpert Xpress SARS-CoV-2/FLU/RSV plus assay is intended as an aid in the diagnosis of influenza from Nasopharyngeal swab specimens and should not be used as a sole basis for treatment. Nasal washings and aspirates are unacceptable  for Xpert Xpress SARS-CoV-2/FLU/RSV testing.  Fact Sheet for Patients: EntrepreneurPulse.com.au  Fact Sheet for Healthcare Providers: IncredibleEmployment.be  This test is not yet approved or cleared by the [place] FDA and has been authorized for detection and/or diagnosis of SARS-CoV-2 by FDA under an Emergency Use Authorization (EUA). This EUA will remain in effect (meaning this test can be used) for the duration of the COVID-19 declaration under Section 564(b)(1) of the Act, 21 U.S.C. section 360bbb-3(b)(1), unless the authorization is terminated or revoked.  Performed at [facility], [place] [address] [place], [place] [address]   Blood culture (routine x 2)     Status: None (Preliminary result)   Collection Time: 06/09/22  8:07 PM   Specimen: BLOOD  Result Value Ref Range Status    Specimen Description BLOOD SITE NOT SPECIFIED  Final   Special Requests   Final    BOTTLES DRAWN AEROBIC AND ANAEROBIC Blood Culture results may not be optimal due to an excessive volume of blood received in culture bottles   Culture   Final    NO GROWTH 2 DAYS Performed at [facility], [place] [address], [place], [place] [address]    Report Status PENDING  Incomplete  Blood culture (routine x 2)     Status: None (Preliminary result)   Collection Time: 06/09/22  8:07 PM   Specimen: BLOOD  Result Value Ref Range Status   Specimen Description BLOOD SITE NOT SPECIFIED  Final   Special Requests   Final    BOTTLES DRAWN AEROBIC AND ANAEROBIC Blood Culture results may not be optimal due to an excessive volume of blood received in culture bottles   Culture   Final    NO GROWTH 2 DAYS Performed at [facility], [place] [address] [place], [place] [address]    Report Status PENDING  Incomplete  Respiratory (~20 pathogens) panel by PCR     Status: Abnormal   Collection Time: 06/10/22  9:28 AM   Specimen: Nasopharyngeal Swab; Respiratory  Result Value Ref Range Status   Adenovirus NOT DETECTED NOT DETECTED Final   Coronavirus 229E NOT DETECTED NOT DETECTED Final    Comment: (NOTE) The Coronavirus on the Respiratory Panel, DOES NOT test for the novel  Coronavirus (2019 nCoV)    Coronavirus HKU1 NOT DETECTED NOT DETECTED Final   Coronavirus NL63 NOT DETECTED NOT DETECTED Final   Coronavirus OC43 NOT DETECTED NOT DETECTED Final   Metapneumovirus NOT DETECTED NOT DETECTED Final   Rhinovirus / Enterovirus NOT DETECTED NOT DETECTED Final   Influenza A NOT DETECTED NOT DETECTED Final   Influenza B NOT DETECTED NOT DETECTED Final   Parainfluenza Virus 1 NOT DETECTED NOT DETECTED Final   Parainfluenza Virus 2 NOT DETECTED NOT DETECTED Final   Parainfluenza Virus 3 DETECTED (A) NOT DETECTED Final   Parainfluenza Virus 4 NOT DETECTED NOT DETECTED Final   Respiratory Syncytial Virus NOT  DETECTED NOT DETECTED Final   Bordetella pertussis NOT DETECTED NOT DETECTED Final   Bordetella Parapertussis NOT DETECTED NOT DETECTED Final   Chlamydophila pneumoniae NOT DETECTED NOT DETECTED Final   Mycoplasma pneumoniae NOT DETECTED NOT DETECTED Final    Comment: Performed at [facility], [place]. [address] [place],  [address]         Radiology Studies: DG Chest Port 1 View  Result Date: 06/09/2022 CLINICAL DATA:  Shortness of breath.  Hypoxia. EXAM: PORTABLE CHEST 1 VIEW COMPARISON:  None Available. FINDINGS: Bilateral infiltrates, right greater  than left, with a peripheral distribution. The heart, hila, mediastinum, lungs, and pleura are otherwise unremarkable. IMPRESSION: Bilateral pulmonary infiltrates worrisome for multifocal pneumonia. Recommend follow-up to complete resolution. Electronically Signed   By: Dorise Bullion III M.D.   On: 06/09/2022 16:32        Scheduled Meds:  apixaban  2.5 mg Oral BID   FLUoxetine  20 mg Oral Q breakfast   insulin aspart  0-9 Units Subcutaneous TID WC   insulin detemir  12 Units Subcutaneous QHS   ipratropium-albuterol  3 mL Nebulization Q6H   levothyroxine  50 mcg Oral Q0600   loratadine  10 mg Oral Daily   pantoprazole  40 mg Oral BID WC   potassium chloride  40 mEq Oral Once   pramipexole  0.5 mg Oral QPM   QUEtiapine  100 mg Oral QHS   Continuous Infusions:  azithromycin 500 mg (06/10/22 2107)   cefTRIAXone (ROCEPHIN)  IV Stopped (06/10/22 2046)     LOS: 2 days   Time spent= 35 mins    Carliss Porcaro Arsenio Loader, MD [facility]  If 7PM-7AM, please contact night-coverage  06/11/2022, 8:04 AM

## 2022-06-11 NOTE — Progress Notes (Signed)
SATURATION QUALIFICATIONS: (This note is used to comply with regulatory documentation for home oxygen)  Patient Saturations on Room Air at Rest = 87%  Patient Saturations on Room Air while Ambulating = N/A  Patient Saturations on 3 Liters of oxygen while Ambulating = 94%  Please briefly explain why patient needs home oxygen: To maintain oxygen saturation > 90% at rest and with ambulation.  Wyona Almas, PT, DPT Acute Rehabilitation Services Office [phone]

## 2022-06-11 NOTE — Evaluation (Signed)
Occupational Therapy Evaluation Patient Details Name: Melissa [name] MRN: [id number] DOB: 1928/08/11 Today's Date: 06/11/2022   History of Present Illness Pt is a 88 y/o female admitted for progressive SOB and generalized weakness in setting of multifocal pneumonia. PMH: CHF, CKD, DM2, a fib, dementia.   Clinical Impression   PTA, pt lives with family, typically Modified Independent with ADLs and mobility with intermittent cane/RW use. Pt presents now with deficits in cardiopulmonary tolerance, strength and dynamic standing balance relying on DME for mobility at this time. Overall, pt able to mobilize in room using RW at min guard. Pt requires Setup for UB ADL and min guard to Min A for LB ADLs due to deficits. Educated re: energy conservation strategies, breathing techniques and progression of activity levels. Anticipate no OT needs if pt to DC home with good family support. Also expect quick return to PLOF when PNA resolves.    SpO2 94% at rest on 3 L O2, 90% on 3 L O2 during activity HR up to 120     Recommendations for follow up therapy are one component of a multi-disciplinary discharge planning process, led by the attending physician.  Recommendations may be updated based on patient status, additional functional criteria and insurance authorization.   Follow Up Recommendations  No OT follow up    Assistance Recommended at Discharge Intermittent Supervision/Assistance  Patient can return home with the following A little help with walking and/or transfers;A little help with bathing/dressing/bathroom    Functional Status Assessment  Patient has had a recent decline in their functional status and demonstrates the ability to make significant improvements in function in a reasonable and predictable amount of time.  Equipment Recommendations  None recommended by OT    Recommendations for Other Services       Precautions / Restrictions Precautions Precautions: Fall;Other  (comment) Precaution Comments: monitor O2 (does not wear at baseline) Restrictions Weight Bearing Restrictions: No      Mobility Bed Mobility Overal bed mobility: Modified Independent Bed Mobility: Supine to Sit     Supine to sit: Modified independent (Device/Increase time)          Transfers Overall transfer level: Needs assistance Equipment used: Rolling walker (2 wheels) Transfers: Sit to/from Stand Sit to Stand: Min guard           General transfer comment: 2 trials to fully stand with use of momentum. was noted to pull on RW      Balance Overall balance assessment: Needs assistance, History of Falls Sitting-balance support: No upper extremity supported, Feet supported Sitting balance-Leahy Scale: Good     Standing balance support: Bilateral upper extremity supported, During functional activity Standing balance-Leahy Scale: Poor Standing balance comment: reliant on at least one UE support in standing                           ADL either performed or assessed with clinical judgement   ADL Overall ADL's : Needs assistance/impaired Eating/Feeding: Set up   Grooming: Set up;Sitting;Wash/dry face   Upper Body Bathing: Set up;Sitting   Lower Body Bathing: Min guard;Sit to/from stand   Upper Body Dressing : Set up;Sitting   Lower Body Dressing: Minimal assistance;Sit to/from stand   Toilet Transfer: Min guard;Ambulation;Rolling walker (2 wheels)   Toileting- Clothing Manipulation and Hygiene: Min guard;Sitting/lateral lean;Sit to/from stand       Functional mobility during ADLs: Min guard;Rolling walker (2 wheels) General ADL Comments: Pt fairly close to  baseline, limited by new supplemental O2 needs and DOE. Educated re: energy conservation strategies with pt already implementing strategies (rest breaks, breathing techniques). did not provide handout d/t low vision     Vision Baseline Vision/History: 3 Glaucoma;1 Wears glasses Ability to  See in Adequate Light: 2 Moderately impaired Patient Visual Report: No change from baseline Vision Assessment?: Vision impaired- to be further tested in functional context Additional Comments: difficulty reading menu per RN, able to functionally see with mobility in room     Perception     Praxis      Pertinent Vitals/Pain Pain Assessment Pain Assessment: No/denies pain     Hand Dominance Right   Extremity/Trunk Assessment Upper Extremity Assessment Upper Extremity Assessment: Generalized weakness   Lower Extremity Assessment Lower Extremity Assessment: Defer to PT evaluation   Cervical / Trunk Assessment Cervical / Trunk Assessment: Kyphotic   Communication Communication Communication: No difficulties;HOH   Cognition Arousal/Alertness: Awake/alert Behavior During Therapy: WFL for tasks assessed/performed Overall Cognitive Status: History of cognitive impairments - at baseline                                 General Comments: hx of dementia, able to provide accurate home setup in comparison to previous admission, follows all directions and appropriate in conversation. Reports year as 2002 and unable to recall month. Pt with one instance of confusion asking "why did they get here?" but able to discern question and reorient move from ED to this current room.     General Comments       Exercises     Shoulder Instructions      Home Living Family/patient expects to be discharged to:: Private residence Living Arrangements: Children Available Help at Discharge: Family;Available 24 hours/day Type of Home: Apartment Home Access: Level entry     Home Layout: One level     Bathroom Shower/Tub: Teacher, early years/pre: Standard Bathroom Accessibility: Yes   Home Equipment: Rollator (4 wheels);Cane - single point;Tub bench;Grab bars - tub/shower          Prior Functioning/Environment Prior Level of Function : Needs assist              Mobility Comments: typically no AD though will use cane vs RW when feeling weaker. Reports a recent fall 3 weeks ago but denies injury. ADLs Comments: Reports Mod I for ADLs. Family assist with IADLs        OT Problem List: Decreased strength;Decreased activity tolerance;Impaired balance (sitting and/or standing);Cardiopulmonary status limiting activity;Decreased cognition      OT Treatment/Interventions: Self-care/ADL training;Therapeutic exercise;Energy conservation;DME and/or AE instruction;Therapeutic activities    OT Goals(Current goals can be found in the care plan section) Acute Rehab OT Goals Patient Stated Goal: have some coffee, get rid of this PNA OT Goal Formulation: With patient Time For Goal Achievement: 06/25/22 Potential to Achieve Goals: Good  OT Frequency: Min 2X/week    Co-evaluation              AM-PAC OT "6 Clicks" Daily Activity     Outcome Measure Help from another person eating meals?: A Little Help from another person taking care of personal grooming?: A Little Help from another person toileting, which includes using toliet, bedpan, or urinal?: A Little Help from another person bathing (including washing, rinsing, drying)?: A Little Help from another person to put on and taking off regular upper body clothing?: A Little Help  from another person to put on and taking off regular lower body clothing?: A Little 6 Click Score: 18   End of Session Equipment Utilized During Treatment: Rolling walker (2 wheels);Oxygen Nurse Communication: Mobility status  Activity Tolerance: Patient tolerated treatment well Patient left: in chair;with call bell/phone within reach;with chair alarm set  OT Visit Diagnosis: Unsteadiness on feet (R26.81)                Time: 6568-1275 OT Time Calculation (min): 28 min Charges:  OT General Charges $OT Visit: 1 Visit OT Evaluation $OT Eval Low Complexity: 1 Low OT Treatments $Self Care/Home Management : 8-22  mins  Malachy Chamber, OTR/L Acute Rehab Services Office: [place] 06/11/2022, 8:15 AM

## 2022-06-11 NOTE — Progress Notes (Signed)
  Transition of Care [facility]) Screening Note   Patient Details  Name: Melissa [name] Date of Birth: 1928/05/06   Transition of Care [facility]) CM/SW Contact:    Sharin Mons, RN Phone Number: [phone] 06/11/2022, 6:49 AM   Admitted with generalized weakness, multifocal PNA. From home with daughter.   Transition of Care Department [facility]) has reviewed patient and following for TOC needs. PT and OT evaluations are pending.   We will continue to monitor patient advancement through interdisciplinary progression rounds. If new patient transition needs arise, please place a TOC consult.

## 2022-06-11 NOTE — Plan of Care (Signed)

## 2022-06-11 NOTE — Progress Notes (Signed)
Mobility Specialist Progress Note   06/11/22 1715  Mobility  Activity Refused mobility   Pt deferring mobility this evening and preferring to eat dinner d/t not having eaten all day. Will not be able to f/u later this evening and will f/u tomorrow if time permits.  Holland Falling Mobility Specialist MS [facility] #:  [phone] Acute Rehab Office:  [phone]

## 2022-06-11 NOTE — Evaluation (Signed)
Physical Therapy Evaluation Patient Details Name: Melissa [name] MRN: [id number] DOB: 1928-03-04 Today's Date: 06/11/2022  History of Present Illness  Pt is a 88 y/o female admitted for progressive SOB and generalized weakness in setting of multifocal pneumonia. PMH: CHF, CKD, DM2, a fib, dementia.  Clinical Impression  PTA, pt lives with her family, is a limited community ambulator with Rollator vs cane use, and modI with ADL's. Pt presents with decreased cardiopulmonary endurance in comparison to her baseline. Ambulating 140 ft with a Rollator at a supervision level; SpO2 > 90% on 3L O2, HR 109-117 bpm. Instructed on IS use; pt pulling up to ~1,000. Pt continues with dry cough. Will continue to follow acutely to progress mobility as tolerated; don't anticipate need for PT follow up services.     Recommendations for follow up therapy are one component of a multi-disciplinary discharge planning process, led by the attending physician.  Recommendations may be updated based on patient status, additional functional criteria and insurance authorization.  Follow Up Recommendations No PT follow up      Assistance Recommended at Discharge PRN  Patient can return home with the following  A little help with walking and/or transfers;A little help with bathing/dressing/bathroom;Direct supervision/assist for medications management;Assist for transportation;Help with stairs or ramp for entrance    Equipment Recommendations None recommended by PT  Recommendations for Other Services       Functional Status Assessment Patient has had a recent decline in their functional status and demonstrates the ability to make significant improvements in function in a reasonable and predictable amount of time.     Precautions / Restrictions Precautions Precautions: Fall;Other (comment) Precaution Comments: monitor O2 (does not wear at baseline) Restrictions Weight Bearing Restrictions: No      Mobility  Bed  Mobility               General bed mobility comments: OOB in chair    Transfers Overall transfer level: Needs assistance Equipment used: Rollator (4 wheels) Transfers: Sit to/from Stand Sit to Stand: Supervision           General transfer comment: supervision for safety    Ambulation/Gait Ambulation/Gait assistance: Supervision Gait Distance (Feet): 140 Feet Assistive device: Rollator (4 wheels) Gait Pattern/deviations: Step-through pattern, Decreased stride length Gait velocity: decreased     General Gait Details: Slow and steady pace, no gross imbalance noted  Stairs            Wheelchair Mobility    Modified Rankin (Stroke Patients Only)       Balance Overall balance assessment: Needs assistance, History of Falls Sitting-balance support: No upper extremity supported, Feet supported Sitting balance-Leahy Scale: Good     Standing balance support: Bilateral upper extremity supported, During functional activity Standing balance-Leahy Scale: Poor Standing balance comment: reliant on at least one UE support in standing                             Pertinent Vitals/Pain Pain Assessment Pain Assessment: No/denies pain    Home Living Family/patient expects to be discharged to:: Private residence Living Arrangements: Children Available Help at Discharge: Family;Available 24 hours/day Type of Home: Apartment Home Access: Level entry       Home Layout: One level Home Equipment: Rollator (4 wheels);Cane - single point;Tub bench;Grab bars - tub/shower      Prior Function Prior Level of Function : Needs assist  Mobility Comments: typically no AD though will use cane vs RW when feeling weaker. Reports a recent fall 3 weeks ago but denies injury. ADLs Comments: Reports Mod I for ADLs. Family assist with IADLs     Hand Dominance   Dominant Hand: Right    Extremity/Trunk Assessment   Upper Extremity Assessment Upper  Extremity Assessment: Generalized weakness    Lower Extremity Assessment Lower Extremity Assessment: Generalized weakness    Cervical / Trunk Assessment Cervical / Trunk Assessment: Kyphotic  Communication   Communication: No difficulties;HOH  Cognition Arousal/Alertness: Awake/alert Behavior During Therapy: WFL for tasks assessed/performed Overall Cognitive Status: History of cognitive impairments - at baseline                                 General Comments: hx of dementia, able to provide accurate home setup in comparison to previous admission, follows all directions and appropriate in conversation. Reports year as 2002 and unable to recall month. Pt with one instance of confusion asking "why did they get here?" but able to discern question and reorient move from ED to this current room.        General Comments      Exercises     Assessment/Plan    PT Assessment Patient needs continued PT services  PT Problem List Decreased strength;Decreased activity tolerance;Decreased balance;Decreased mobility;Cardiopulmonary status limiting activity       PT Treatment Interventions DME instruction;Gait training;Functional mobility training;Therapeutic activities;Therapeutic exercise;Balance training;Patient/family education    PT Goals (Current goals can be found in the Care Plan section)  Acute Rehab PT Goals Patient Stated Goal: did not state PT Goal Formulation: With patient Time For Goal Achievement: 06/25/22 Potential to Achieve Goals: Good    Frequency Min 3X/week     Co-evaluation               AM-PAC PT "6 Clicks" Mobility  Outcome Measure Help needed turning from your back to your side while in a flat bed without using bedrails?: None Help needed moving from lying on your back to sitting on the side of a flat bed without using bedrails?: A Little Help needed moving to and from a bed to a chair (including a wheelchair)?: A Little Help needed  standing up from a chair using your arms (e.g., wheelchair or bedside chair)?: A Little Help needed to walk in hospital room?: A Little Help needed climbing 3-5 steps with a railing? : A Little 6 Click Score: 19    End of Session Equipment Utilized During Treatment: Oxygen Activity Tolerance: Patient tolerated treatment well Patient left: in chair;with call bell/phone within reach Nurse Communication: Mobility status PT Visit Diagnosis: Difficulty in walking, not elsewhere classified (R26.2)    Time: 9357-0177 PT Time Calculation (min) (ACUTE ONLY): 18 min   Charges:   PT Evaluation $PT Eval Low Complexity: 1 Low          Melissa [name], PT, DPT Acute Rehabilitation Services Office [phone]   Melissa [name] 06/11/2022, 8:44 AM

## 2022-06-12 ENCOUNTER — Inpatient Hospital Stay (HOSPITAL_COMMUNITY): Payer: Medicare Other

## 2022-06-12 DIAGNOSIS — J189 Pneumonia, unspecified organism: Secondary | ICD-10-CM | POA: Diagnosis not present

## 2022-06-12 DIAGNOSIS — A419 Sepsis, unspecified organism: Secondary | ICD-10-CM | POA: Diagnosis not present

## 2022-06-12 DIAGNOSIS — J9601 Acute respiratory failure with hypoxia: Secondary | ICD-10-CM | POA: Diagnosis not present

## 2022-06-12 DIAGNOSIS — D649 Anemia, unspecified: Secondary | ICD-10-CM | POA: Diagnosis not present

## 2022-06-12 LAB — GLUCOSE, CAPILLARY
Glucose-Capillary: 85 mg/dL (ref 70–99)
Glucose-Capillary: 138 mg/dL — ABNORMAL HIGH (ref 70–99)
Glucose-Capillary: 269 mg/dL — ABNORMAL HIGH (ref 70–99)
Glucose-Capillary: 279 mg/dL — ABNORMAL HIGH (ref 70–99)

## 2022-06-12 LAB — PHOSPHORUS: Phosphorus: 3.4 mg/dL (ref 2.5–4.6)

## 2022-06-12 LAB — CBC WITH DIFFERENTIAL/PLATELET
Abs Immature Granulocytes: 0.09 10*3/uL — ABNORMAL HIGH (ref 0.00–0.07)
Basophils Absolute: 0.1 10*3/uL (ref 0.0–0.1)
Basophils Relative: 1 %
Eosinophils Absolute: 0.2 10*3/uL (ref 0.0–0.5)
Eosinophils Relative: 2 %
HCT: 28.3 % — ABNORMAL LOW (ref 36.0–46.0)
Hemoglobin: 9 g/dL — ABNORMAL LOW (ref 12.0–15.0)
Immature Granulocytes: 1 %
Lymphocytes Relative: 15 %
Lymphs Abs: 1.4 10*3/uL (ref 0.7–4.0)
MCH: 26.3 pg (ref 26.0–34.0)
MCHC: 31.8 g/dL (ref 30.0–36.0)
MCV: 82.7 fL (ref 80.0–100.0)
Monocytes Absolute: 1 10*3/uL (ref 0.1–1.0)
Monocytes Relative: 10 %
Neutro Abs: 6.6 10*3/uL (ref 1.7–7.7)
Neutrophils Relative %: 71 %
Platelets: 368 10*3/uL (ref 150–400)
RBC: 3.42 MIL/uL — ABNORMAL LOW (ref 3.87–5.11)
RDW: 13.7 % (ref 11.5–15.5)
WBC: 9.2 10*3/uL (ref 4.0–10.5)
nRBC: 0 % (ref 0.0–0.2)

## 2022-06-12 LAB — COMPREHENSIVE METABOLIC PANEL
ALT: 10 U/L (ref 0–44)
AST: 10 U/L — ABNORMAL LOW (ref 15–41)
Albumin: 1.7 g/dL — ABNORMAL LOW (ref 3.5–5.0)
Alkaline Phosphatase: 67 U/L (ref 38–126)
Anion gap: 8 (ref 5–15)
BUN: 7 mg/dL — ABNORMAL LOW (ref 8–23)
CO2: 25 mmol/L (ref 22–32)
Calcium: 8.3 mg/dL — ABNORMAL LOW (ref 8.9–10.3)
Chloride: 104 mmol/L (ref 98–111)
Creatinine, Ser: 1.21 mg/dL — ABNORMAL HIGH (ref 0.44–1.00)
GFR, Estimated: 42 mL/min — ABNORMAL LOW (ref >60)
Glucose, Bld: 58 mg/dL — ABNORMAL LOW (ref 70–99)
Potassium: 4.4 mmol/L (ref 3.5–5.1)
Sodium: 137 mmol/L (ref 135–145)
Total Bilirubin: 0.6 mg/dL (ref 0.3–1.2)
Total Protein: 5.4 g/dL — ABNORMAL LOW (ref 6.5–8.1)

## 2022-06-12 LAB — MAGNESIUM: Magnesium: 1.9 mg/dL (ref 1.7–2.4)

## 2022-06-12 MED ORDER — IPRATROPIUM-ALBUTEROL 0.5-2.5 (3) MG/3ML IN SOLN: 3.0000 mL | Freq: Three times a day (TID) | RESPIRATORY_TRACT | Status: DC

## 2022-06-12 MED ORDER — DM-GUAIFENESIN ER 30-600 MG PO TB12
1.0000 | ORAL_TABLET | Freq: Two times a day (BID) | ORAL | Status: DC
  Administered 2022-06-12 – 2022-06-21 (×19): 1 via ORAL
  Filled 2022-06-12 (×19): qty 1

## 2022-06-12 MED ORDER — IPRATROPIUM-ALBUTEROL 0.5-2.5 (3) MG/3ML IN SOLN
3.0000 mL | Freq: Four times a day (QID) | RESPIRATORY_TRACT | Status: DC
Start: 2022-06-12 — End: 2022-06-12
  Administered 2022-06-12: 3 mL via RESPIRATORY_TRACT
  Filled 2022-06-12: qty 3

## 2022-06-12 MED ORDER — METHYLPREDNISOLONE SODIUM SUCC 125 MG IJ SOLR
60.0000 mg | Freq: Every day | INTRAMUSCULAR | Status: DC
  Administered 2022-06-12 – 2022-06-21 (×10): 60 mg via INTRAVENOUS
  Filled 2022-06-12 (×11): qty 2

## 2022-06-12 MED ORDER — IPRATROPIUM-ALBUTEROL 0.5-2.5 (3) MG/3ML IN SOLN
3.0000 mL | Freq: Four times a day (QID) | RESPIRATORY_TRACT | Status: DC
  Administered 2022-06-12 – 2022-06-14 (×5): 3 mL via RESPIRATORY_TRACT
  Filled 2022-06-12 (×5): qty 3

## 2022-06-12 NOTE — Progress Notes (Signed)
ARMINE RIZZOLO [id number]:[id number] DOB: 10-15-1927 DOA: 06/09/2022 PCP: Darreld Mclean, MD   Subj: 88 year old WF PMHx chronic systolic CHF, stage IV CKD baseline creatinine 1.7, DM Type 2, paroxysmal A-fib on Eliquis, Dementia   comes to the hospital with concerns of multifocal pneumonia and generalized weakness.  Patient states started feeling weak about a week ago.  ED chest x-ray showed multifocal pneumonia, she was started on IV Rocephin and azithromycin.  BNP was negative.  COVID-19/flu-negative due to concerns of aspiration she was seen by speech therapist and eventually cleared for regular diet.   Obj: Afebrile overnight, A/O x4, not on home O2.  Currently SOB.   Objective: VITAL SIGNS: Temp: 98 F (36.7 C) (08/30 0729) Temp Source: Oral (08/29 2310) BP: 118/69 (08/30 0729) Pulse Rate: 97 (08/30 0939) SPO2; 98% FIO2: 3 L O2 via [place]   Intake/Output Summary (Last 24 hours) at 06/12/2022 1019 Last data filed at 06/11/2022 1635 Gross per 24 hour  Intake 50 ml  Output --  Net 50 ml     Exam: General: A/O x4, positive acute respiratory distress Lungs: decreased breath sounds bilaterally, diffuse expiratory wheezes, negative crackles Cardiovascular: Regular rate and rhythm without murmur gallop or rub normal S1 and S2 Abdomen: Nontender, nondistended, soft, bowel sounds positive, no rebound, no ascites, no appreciable mass Extremities: No significant cyanosis, clubbing, or edema bilateral lower extremities Skin: Negative rashes, lesions, ulcers Psychiatric:  Negative depression, negative anxiety, negative fatigue, negative mania  Central nervous system:  Cranial nerves II through XII intact, tongue/uvula midline, all extremities muscle strength 5/5, sensation intact throughout, negative dysarthria, negative expressive aphasia, negative receptive aphasia.  .   Mobility Assessment (last 72 hours)     Mobility Assessment     Row Name 06/11/22 2104 06/11/22 2030 06/11/22 0930  06/11/22 0842 06/11/22 0700   Does patient have an order for bedrest or is patient medically unstable No - Continue assessment No - Continue assessment -- -- --   What is the highest level of mobility based on the progressive mobility assessment? Level 5 (Walks with assist in room/hall) - Balance while stepping forward/back and can walk in room with assist - Complete Level 5 (Walks with assist in room/hall) - Balance while stepping forward/back and can walk in room with assist - Complete Level 5 (Walks with assist in room/hall) - Balance while stepping forward/back and can walk in room with assist - Complete Level 5 (Walks with assist in room/hall) - Balance while stepping forward/back and can walk in room with assist - Complete Level 4 (Walks with assist in room) - Balance while marching in place and cannot step forward and back - Complete    Row Name 06/11/22 0000           Does patient have an order for bedrest or is patient medically unstable No - Continue assessment       What is the highest level of mobility based on the progressive mobility assessment? Level 4 (Walks with assist in room) - Balance while marching in place and cannot step forward and back - Complete                  DVT prophylaxis: Eliquis Code Status: Full Family Communication: 8/30 son at bedside for discussion plan of care all questions answered Status is: Inpatient    Dispo: The patient is from: Home              Anticipated d/c is to: Home  Anticipated d/c date is: > 3 days              Patient currently is not medically stable to d/c.      Consultants:     Cultures 8/27 SARS coronavirus negative 8/27 influenza A/B  negative 8/28 positive parainfluenza   Antimicrobials:  A/P  positive parainfluenza pneumonia -Solu-Medrol 60 mg daily - Incentive spirometry - Flutter valve - DuoNeb QID - Mucinex DM BID - Continuous pulse ox - Titrate O2 to maintain SPO2> 93%   Acute  respiratory failure with hypoxia/multifocal pneumonia -See parainfluenza pneumonia   hypomagnesemia - Replete as needed   Nausea and Vomiting -Will order Esophogram    Chronic Systolic CHF  - Echo July 2022 showed EF of 20%.   -Strict ins and out - Daily weight  Paroxysmal A-fib -See CHF - On Eliquis   CKD stage IV(Baseline creatinine 1.3.) Lab Results  Component Value Date   CREATININE 1.21 (H) 06/12/2022   CREATININE 1.20 (H) 06/11/2022   CREATININE 1.51 (H) 06/10/2022   CREATININE 1.74 (H) 06/09/2022   CREATININE 1.93 (H) 12/31/2021  -Baseline   Diabetes mellitus type 2, insulin-dependent - Levemir 12 units daily - Sensitive SSI CBG (last 3)  Recent Labs    06/12/22 0716 06/12/22 1134 06/12/22 1721  GLUCAP 85 138* 269*        Restless leg syndrome - On Mirapex   Anemia of chronic disease - Baseline hemoglobin 11.  Continue to monitor      Care during the described time interval was provided by me .  I have reviewed this patient's available data, including medical history, events of note, physical examination, and all test results as part of my evaluation.

## 2022-06-12 NOTE — Progress Notes (Signed)
Physical Therapy Treatment Patient Details Name: Melissa [name] MRN: [id number] DOB: July 08, 1928 Today's Date: 06/12/2022   History of Present Illness Pt is a 88 y/o female admitted for progressive SOB and generalized weakness in setting of multifocal pneumonia. PMH: CHF, CKD, DM2, a fib, dementia.    PT Comments    Continuing work on functional mobility and activity tolerance;  Session focused on monitoring vitals with gentle physical activity; close monitor for activity tolerance, as pt's HR was 120bpm at beginning of session; still requires supplemental O2, see other PT note of this date  Recommendations for follow up therapy are one component of a multi-disciplinary discharge planning process, led by the attending physician.  Recommendations may be updated based on patient status, additional functional criteria and insurance authorization.  Follow Up Recommendations  No PT follow up     Assistance Recommended at Discharge PRN  Patient can return home with the following A little help with walking and/or transfers;A little help with bathing/dressing/bathroom;Direct supervision/assist for medications management;Assist for transportation;Help with stairs or ramp for entrance   Equipment Recommendations  Other (comment) (aupplemental O2)    Recommendations for Other Services       Precautions / Restrictions Precautions Precautions: Fall;Other (comment) Precaution Comments: monitor O2 (does not wear at baseline)     Mobility  Bed Mobility               General bed mobility comments: EOB with family member upon PT arrival    Transfers Overall transfer level: Needs assistance Equipment used: Rolling walker (2 wheels) Transfers: Sit to/from Stand Sit to Stand: Min guard           General transfer comment: supervision for safety; cues for hand placement    Ambulation/Gait Ambulation/Gait assistance: Min guard, Supervision Gait Distance (Feet): 120 Feet Assistive  device: Rolling walker (2 wheels) Gait Pattern/deviations: Step-through pattern, Decreased stride length Gait velocity: decreased     General Gait Details: Slow and steady pace, no gross imbalance noted   Stairs             Wheelchair Mobility    Modified Rankin (Stroke Patients Only)       Balance     Sitting balance-Leahy Scale: Good       Standing balance-Leahy Scale: Poor Standing balance comment: reliant on at least one UE support in standing                            Cognition Arousal/Alertness: Awake/alert Behavior During Therapy: WFL for tasks assessed/performed Overall Cognitive Status: History of cognitive impairments - at baseline                                          Exercises      General Comments General comments (skin integrity, edema, etc.): Pt getting OOB with family member as PT arrived; Wanting to get up adn OOB; HR range 120-137 observed highest; required supplemental O2 to keep O2 sats at safe, acceptable levels      Pertinent Vitals/Pain Pain Assessment Pain Assessment: No/denies pain    Home Living                          Prior Function            PT Goals (current goals can now  be found in the care plan section) Acute Rehab PT Goals Patient Stated Goal: wants out of the bed PT Goal Formulation: With patient Time For Goal Achievement: 06/25/22 Potential to Achieve Goals: Good Progress towards PT goals: Progressing toward goals    Frequency    Min 3X/week      PT Plan Current plan remains appropriate    Co-evaluation              AM-PAC PT "6 Clicks" Mobility   Outcome Measure  Help needed turning from your back to your side while in a flat bed without using bedrails?: None Help needed moving from lying on your back to sitting on the side of a flat bed without using bedrails?: A Little Help needed moving to and from a bed to a chair (including a wheelchair)?: A  Little Help needed standing up from a chair using your arms (e.g., wheelchair or bedside chair)?: A Little Help needed to walk in hospital room?: A Little Help needed climbing 3-5 steps with a railing? : A Little 6 Click Score: 19    End of Session Equipment Utilized During Treatment: Oxygen Activity Tolerance: Patient tolerated treatment well Patient left: in chair;with call bell/phone within reach Nurse Communication: Mobility status PT Visit Diagnosis: Difficulty in walking, not elsewhere classified (R26.2)     Time: 5809-9833 PT Time Calculation (min) (ACUTE ONLY): 30 min  Charges:  $Gait Training: 23-37 mins                     Roney Marion, [place] 06/12/2022, 2:46 PM

## 2022-06-12 NOTE — Progress Notes (Signed)
OT Cancellation Note  Patient Details Name: Melissa [name] MRN: [id number] DOB: [birth year]   Cancelled Treatment:    Reason Eval/Treat Not Completed: Other (comment) Pt just finished working with PT and fatigued. Will follow up at a later time for OT session.  Layla Maw 06/12/2022, 2:25 PM

## 2022-06-12 NOTE — Progress Notes (Signed)
Mobility Specialist Criteria Algorithm Info.   06/12/22 1624  Mobility  Activity Refused mobility   Will f/u as time permits.  [place] Topacio Cella, [place], [place]  MMCRF:[phone] Office: [phone]

## 2022-06-12 NOTE — Progress Notes (Signed)
Physical Therapy Treatment Note  SATURATION QUALIFICATIONS: (This note is used to comply with regulatory documentation for home oxygen)  Patient Saturations on Room Air at Rest = 90%  Patient Saturations on Room Air while Ambulating = 85%  Patient Saturations on 3-4 Liters of oxygen while Ambulating = 93%  Please briefly explain why patient needs home oxygen: Patient requires supplemental oxygen to maintain oxygen saturations at acceptable, safe levels with physical activity.  (Full PT note to follow)  Melissa [name], [place] Office [phone]

## 2022-06-13 DIAGNOSIS — J189 Pneumonia, unspecified organism: Secondary | ICD-10-CM | POA: Diagnosis not present

## 2022-06-13 DIAGNOSIS — D649 Anemia, unspecified: Secondary | ICD-10-CM | POA: Diagnosis not present

## 2022-06-13 DIAGNOSIS — E1165 Type 2 diabetes mellitus with hyperglycemia: Secondary | ICD-10-CM

## 2022-06-13 DIAGNOSIS — A419 Sepsis, unspecified organism: Secondary | ICD-10-CM | POA: Diagnosis not present

## 2022-06-13 DIAGNOSIS — J9601 Acute respiratory failure with hypoxia: Secondary | ICD-10-CM | POA: Diagnosis not present

## 2022-06-13 LAB — COMPREHENSIVE METABOLIC PANEL
ALT: 10 U/L (ref 0–44)
AST: 12 U/L — ABNORMAL LOW (ref 15–41)
Albumin: 2 g/dL — ABNORMAL LOW (ref 3.5–5.0)
Alkaline Phosphatase: 75 U/L (ref 38–126)
Anion gap: 6 (ref 5–15)
BUN: 10 mg/dL (ref 8–23)
CO2: 27 mmol/L (ref 22–32)
Calcium: 8.5 mg/dL — ABNORMAL LOW (ref 8.9–10.3)
Chloride: 101 mmol/L (ref 98–111)
Creatinine, Ser: 1.29 mg/dL — ABNORMAL HIGH (ref 0.44–1.00)
GFR, Estimated: 39 mL/min — ABNORMAL LOW (ref >60)
Glucose, Bld: 246 mg/dL — ABNORMAL HIGH (ref 70–99)
Potassium: 4.8 mmol/L (ref 3.5–5.1)
Sodium: 134 mmol/L — ABNORMAL LOW (ref 135–145)
Total Bilirubin: 0.3 mg/dL (ref 0.3–1.2)
Total Protein: 6.1 g/dL — ABNORMAL LOW (ref 6.5–8.1)

## 2022-06-13 LAB — CBC WITH DIFFERENTIAL/PLATELET
Abs Immature Granulocytes: 0.08 10*3/uL — ABNORMAL HIGH (ref 0.00–0.07)
Basophils Absolute: 0 10*3/uL (ref 0.0–0.1)
Basophils Relative: 0 %
Eosinophils Absolute: 0 10*3/uL (ref 0.0–0.5)
Eosinophils Relative: 0 %
HCT: 30.1 % — ABNORMAL LOW (ref 36.0–46.0)
Hemoglobin: 9.5 g/dL — ABNORMAL LOW (ref 12.0–15.0)
Immature Granulocytes: 1 %
Lymphocytes Relative: 8 %
Lymphs Abs: 0.7 10*3/uL (ref 0.7–4.0)
MCH: 26.2 pg (ref 26.0–34.0)
MCHC: 31.6 g/dL (ref 30.0–36.0)
MCV: 82.9 fL (ref 80.0–100.0)
Monocytes Absolute: 0.1 10*3/uL (ref 0.1–1.0)
Monocytes Relative: 2 %
Neutro Abs: 7.5 10*3/uL (ref 1.7–7.7)
Neutrophils Relative %: 89 %
Platelets: 387 10*3/uL (ref 150–400)
RBC: 3.63 MIL/uL — ABNORMAL LOW (ref 3.87–5.11)
RDW: 13.6 % (ref 11.5–15.5)
WBC: 8.4 10*3/uL (ref 4.0–10.5)
nRBC: 0 % (ref 0.0–0.2)

## 2022-06-13 LAB — MAGNESIUM: Magnesium: 2 mg/dL (ref 1.7–2.4)

## 2022-06-13 LAB — GLUCOSE, CAPILLARY
Glucose-Capillary: 128 mg/dL — ABNORMAL HIGH (ref 70–99)
Glucose-Capillary: 190 mg/dL — ABNORMAL HIGH (ref 70–99)
Glucose-Capillary: 239 mg/dL — ABNORMAL HIGH (ref 70–99)
Glucose-Capillary: 201 mg/dL — ABNORMAL HIGH (ref 70–99)

## 2022-06-13 LAB — PHOSPHORUS: Phosphorus: 3.4 mg/dL (ref 2.5–4.6)

## 2022-06-13 NOTE — Progress Notes (Signed)
Melissa [name] WRU:[id number] DOB: August 06, 1928 DOA: 06/09/2022 PCP: Darreld Mclean, MD   Subj: 88 year old WF PMHx chronic systolic CHF, stage IV CKD baseline creatinine 1.7, DM Type 2, paroxysmal A-fib on Eliquis, Dementia   comes to the hospital with concerns of multifocal pneumonia and generalized weakness.  Patient states started feeling weak about a week ago.  ED chest x-ray showed multifocal pneumonia, she was started on IV Rocephin and azithromycin.  BNP was negative.  COVID-19/flu-negative due to concerns of aspiration she was seen by speech therapist and eventually cleared for regular diet.   Obj: 8/31 afebrile overnight, tachycardic.  A/O x4.  DOE, negative CP, negative nausea, negative vomiting   Objective: VITAL SIGNS: Temp: 98 F (36.7 C) (08/31 0821) BP: 117/75 (08/31 1000) Pulse Rate: 103 (08/31 1455) SPO2; 96% FIO2: 3 L O2 via [place]  No intake or output data in the 24 hours ending 06/13/22 1751    Exam: General: A/O x4, positive acute respiratory distress Lungs: decreased breath sounds bilaterally, diffuse expiratory wheezes, negative crackles Cardiovascular: Regular rate and rhythm without murmur gallop or rub normal S1 and S2 Abdomen: Nontender, nondistended, soft, bowel sounds positive, no rebound, no ascites, no appreciable mass Extremities: No significant cyanosis, clubbing, or edema bilateral lower extremities Skin: Negative rashes, lesions, ulcers Psychiatric:  Negative depression, negative anxiety, negative fatigue, negative mania  Central nervous system:  Cranial nerves II through XII intact, tongue/uvula midline, all extremities muscle strength 5/5, sensation intact throughout, negative dysarthria, negative expressive aphasia, negative receptive aphasia.  .   Mobility Assessment (last 72 hours)     Mobility Assessment     Row Name 06/13/22 1646 06/13/22 1200 06/12/22 1400 06/12/22 0830 06/11/22 2104   Does patient have an order for bedrest or is  patient medically unstable -- -- -- No - Continue assessment No - Continue assessment   What is the highest level of mobility based on the progressive mobility assessment? Level 5 (Walks with assist in room/hall) - Balance while stepping forward/back and can walk in room with assist - Complete Level 5 (Walks with assist in room/hall) - Balance while stepping forward/back and can walk in room with assist - Complete Level 5 (Walks with assist in room/hall) - Balance while stepping forward/back and can walk in room with assist - Complete Level 5 (Walks with assist in room/hall) - Balance while stepping forward/back and can walk in room with assist - Complete Level 5 (Walks with assist in room/hall) - Balance while stepping forward/back and can walk in room with assist - Complete    Row Name 06/11/22 2030 06/11/22 0930 06/11/22 0842 06/11/22 0700 06/11/22 0000   Does patient have an order for bedrest or is patient medically unstable No - Continue assessment -- -- -- No - Continue assessment   What is the highest level of mobility based on the progressive mobility assessment? Level 5 (Walks with assist in room/hall) - Balance while stepping forward/back and can walk in room with assist - Complete Level 5 (Walks with assist in room/hall) - Balance while stepping forward/back and can walk in room with assist - Complete Level 5 (Walks with assist in room/hall) - Balance while stepping forward/back and can walk in room with assist - Complete Level 4 (Walks with assist in room) - Balance while marching in place and cannot step forward and back - Complete Level 4 (Walks with assist in room) - Balance while marching in place and cannot step forward and back - Complete  DVT prophylaxis: Eliquis Code Status: Full Family Communication: 8/31 daughter at bedside for discussion plan of care all questions answered Status is: Inpatient    Dispo: The patient is from: Home              Anticipated d/c  is to: Home              Anticipated d/c date is: > 3 days              Patient currently is not medically stable to d/c.      Consultants:     Cultures 8/27 SARS coronavirus negative 8/27 influenza A/B  negative 8/28 positive parainfluenza   Antimicrobials: Anti-infectives (From admission, onward)    Start     Ordered Stop   06/09/22 1915  cefTRIAXone (ROCEPHIN) 2 g in sodium chloride 0.9 % 100 mL IVPB  Status:  Discontinued        06/09/22 1913 06/11/22 0806   06/09/22 1915  azithromycin (ZITHROMAX) 500 mg in sodium chloride 0.9 % 250 mL IVPB  Status:  Discontinued        06/09/22 1913 06/11/22 0806          A/P  positive parainfluenza pneumonia -Solu-Medrol 60 mg daily - Incentive spirometry - Flutter valve - DuoNeb QID - Mucinex DM BID - Continuous pulse ox - Titrate O2 to maintain SPO2> 93%   Acute respiratory failure with hypoxia/multifocal pneumonia -See parainfluenza pneumonia   hypomagnesemia - Replete as needed   Nausea and Vomiting -Will order Esophogram    Chronic Systolic CHF  - Echo July 2022 showed EF of 20%.   -Strict ins and out +2.2 L - Daily weight  Paroxysmal A-fib -See CHF - On Eliquis   CKD stage IV(Baseline creatinine 1.3.) Lab Results  Component Value Date   CREATININE 1.29 (H) 06/13/2022   CREATININE 1.21 (H) 06/12/2022   CREATININE 1.20 (H) 06/11/2022   CREATININE 1.51 (H) 06/10/2022   CREATININE 1.74 (H) 06/09/2022  -Baseline   Diabetes mellitus type 2 uncontrolled with Hyperglycemia, insulin-dependent -8/27 hemoglobin A1c= 8.5 - Levemir 12 units daily - Sensitive SSI CBG (last 3)  Recent Labs    06/13/22 0729 06/13/22 1106 06/13/22 1607  GLUCAP 128* 190* 239*         Restless leg syndrome - On Mirapex   Anemia of chronic disease - Baseline hemoglobin 11.   -Transfuse for hemoglobin<7 Lab Results  Component Value Date   HGB 9.5 (L) 06/13/2022   HGB 9.0 (L) 06/12/2022   HGB 9.1 (L) 06/11/2022    HGB 9.1 (L) 06/10/2022   HGB 11.0 (L) 06/09/2022  -Stable     Care during the described time interval was provided by me .  I have reviewed this patient's available data, including medical history, events of note, physical examination, and all test results as part of my evaluation.

## 2022-06-13 NOTE — Inpatient Diabetes Management (Signed)
Inpatient Diabetes Program Recommendations  AACE/ADA: New Consensus Statement on Inpatient Glycemic Control (2015)  Target Ranges:  Prepandial:   less than 140 mg/dL      Peak postprandial:   less than 180 mg/dL (1-2 hours)      Critically ill patients:  140 - 180 mg/dL   Lab Results  Component Value Date   GLUCAP 128 (H) 06/13/2022   HGBA1C 8.5 (H) 06/09/2022    Review of Glycemic Control  Latest Reference Range & Units 06/12/22 17:21 06/12/22 19:55 06/13/22 07:29  Glucose-Capillary 70 - 99 mg/dL 269 (H) 279 (H) 128 (H)  (H): Data is abnormally high Diabetes history: Type 2 DM Outpatient Diabetes medications: Humalog 6 units QD, Levemir 25 units QHS, Ozempic 0.5 mg qwk Current orders for Inpatient glycemic control: Levemir 12 units QHS, Novolog 0-9 units TID Solumedrol 60 mg QD  Inpatient Diabetes Program Recommendations:    In the setting of steroids, consider adding Novolog 3 units TID (Assuming patient is consuming >50% of meals).   Thanks, Bronson Curb, MSN, RNC-OB Diabetes Coordinator [phone] (8a-5p)

## 2022-06-13 NOTE — Progress Notes (Signed)
Occupational Therapy Treatment Patient Details Name: Melissa [name] MRN: [id number] DOB: 03/06/1928 Today's Date: 06/13/2022   History of present illness Pt is a 88 y/o female admitted for progressive SOB and generalized weakness in setting of multifocal pneumonia. PMH: CHF, CKD, DM2, a fib, dementia.   OT comments  Session focused on Rollator practice during ADLs standing/seated at sink today. Pt requires Setup for UB ADL, Min A for LB ADLs with noted DOE by end of ADL tasks. Pt required consistent cues for Rollator brake mgmt during session today. Reinforced energy conservation strategies (use of shower chair, seated rest breaks and breathing techniques). Pt's daughter present, supportive and reports concerns over pt's weakness - unsure if HHOT would be compatible with pt's current home hospice care. Pt's daughter hopeful that their current aide can provide increased assistance for ADLs at home.   Spo2 88-94% on 3 L O2, 4/4 DOE HR 100-110   Recommendations for follow up therapy are one component of a multi-disciplinary discharge planning process, led by the attending physician.  Recommendations may be updated based on patient status, additional functional criteria and insurance authorization.    Follow Up Recommendations  No OT follow up (unsure if [place] with pt's current hospice care plan)    Assistance Recommended at Discharge Intermittent Supervision/Assistance  Patient can return home with the following  A little help with walking and/or transfers;A little help with bathing/dressing/bathroom   Equipment Recommendations  None recommended by OT    Recommendations for Other Services      Precautions / Restrictions Precautions Precautions: Fall;Other (comment) Precaution Comments: monitor O2 (does not wear at baseline). HR Restrictions Weight Bearing Restrictions: No       Mobility Bed Mobility               General bed mobility comments: up in chair     Transfers Overall transfer level: Needs assistance Equipment used: Rollator (4 wheels) Transfers: Sit to/from Stand Sit to Stand: Supervision           General transfer comment: cues for brake mgmt needed throughou     Balance Overall balance assessment: Needs assistance, History of Falls Sitting-balance support: No upper extremity supported, Feet supported Sitting balance-Leahy Scale: Good     Standing balance support: Bilateral upper extremity supported, During functional activity Standing balance-Leahy Scale: Fair                             ADL either performed or assessed with clinical judgement   ADL Overall ADL's : Needs assistance/impaired     Grooming: Set up;Sitting;Brushing hair;Wash/dry face Grooming Details (indicate cue type and reason): seated at sink Upper Body Bathing: Set up;Sitting   Lower Body Bathing: Min guard;Sit to/from stand Lower Body Bathing Details (indicate cue type and reason): for safety/balance. able to bathe peri region standing at sink Upper Body Dressing : Set up;Sitting Upper Body Dressing Details (indicate cue type and reason): assist for lines Lower Body Dressing: Minimal assistance;Sit to/from stand Lower Body Dressing Details (indicate cue type and reason): assist to don pants/underwear around feet. also safety concern due to Rollator seat height if pt were to bend to feet             Functional mobility during ADLs: Min guard;Rollator (4 wheels);Cueing for safety;Cueing for sequencing General ADL Comments: Pt able to stand/sit on Rollator seat at sink in bathroom for full bathing/dressing tasks. Emphasis on energy conservation due to  noted DOE though O2 sats fairly stable.    Extremity/Trunk Assessment Upper Extremity Assessment Upper Extremity Assessment: Generalized weakness   Lower Extremity Assessment Lower Extremity Assessment: Defer to PT evaluation        Vision   Vision Assessment?: Vision  impaired- to be further tested in functional context Additional Comments: hx of glaucoma per nursing   Perception     Praxis      Cognition Arousal/Alertness: Awake/alert Behavior During Therapy: WFL for tasks assessed/performed Overall Cognitive Status: History of cognitive impairments - at baseline                                 General Comments: hx of dementia, pleasant and follows directions consistently.memory deficits        Exercises      Shoulder Instructions       General Comments      Pertinent Vitals/ Pain       Pain Assessment Pain Assessment: No/denies pain  Home Living                                          Prior Functioning/Environment              Frequency  Min 2X/week        Progress Toward Goals  OT Goals(current goals can now be found in the care plan section)  Progress towards OT goals: Progressing toward goals  Acute Rehab OT Goals Patient Stated Goal: improve breathing OT Goal Formulation: With patient Time For Goal Achievement: 06/25/22 Potential to Achieve Goals: Good ADL Goals Pt Will Transfer to Toilet: with modified independence;ambulating Additional ADL Goal #1: Pt to implement at least 3 energy conservation strategies during ADLs/mobility Additional ADL Goal #2: Pt to increase standing activity tolerance > 7 min without rest break during functional tasks  Plan Discharge plan remains appropriate    Co-evaluation                 AM-PAC OT "6 Clicks" Daily Activity     Outcome Measure   Help from another person eating meals?: A Little Help from another person taking care of personal grooming?: A Little Help from another person toileting, which includes using toliet, bedpan, or urinal?: A Little Help from another person bathing (including washing, rinsing, drying)?: A Little Help from another person to put on and taking off regular upper body clothing?: A Little Help from  another person to put on and taking off regular lower body clothing?: A Little 6 Click Score: 18    End of Session Equipment Utilized During Treatment: Rollator (4 wheels);Oxygen  OT Visit Diagnosis: Unsteadiness on feet (R26.81)   Activity Tolerance Patient tolerated treatment well   Patient Left in chair;with call bell/phone within reach;with chair alarm set;Other (comment);with family/visitor present (MD at bedside)   Nurse Communication Mobility status        Time: 3295-1884 OT Time Calculation (min): 36 min  Charges: OT General Charges $OT Visit: 1 Visit OT Treatments $Self Care/Home Management : 23-37 mins  Malachy Chamber, OTR/L Acute Rehab Services Office: [phone]   Layla Maw 06/13/2022, 12:43 PM

## 2022-06-13 NOTE — Progress Notes (Signed)
Physical Therapy Treatment Patient Details Name: Melissa [name] MRN: [id number] DOB: 1927-11-29 Today's Date: 06/13/2022   History of Present Illness Pt is a 88 y/o female admitted for progressive SOB and generalized weakness in setting of multifocal pneumonia. PMH: CHF, CKD, DM2, a fib, dementia.    PT Comments    Patient continues to require supplemental O2 to maintain spO2 >90% during mobility. Overall, mobilizing at min guard-supervision level with use of rollator. Patient with 3/4 DOE after 120' of ambulation with 3L O2 [place] with spO2 >92% throughout. Patient has assistance at home and daughter will inquire about increasing assistance in the home. D/c plan remains appropriate.     Recommendations for follow up therapy are one component of a multi-disciplinary discharge planning process, led by the attending physician.  Recommendations may be updated based on patient status, additional functional criteria and insurance authorization.  Follow Up Recommendations  No PT follow up     Assistance Recommended at Discharge PRN  Patient can return home with the following A little help with walking and/or transfers;A little help with bathing/dressing/bathroom;Direct supervision/assist for medications management;Assist for transportation;Help with stairs or ramp for entrance   Equipment Recommendations  None recommended by PT    Recommendations for Other Services       Precautions / Restrictions Precautions Precautions: Fall;Other (comment) Precaution Comments: monitor O2 (does not wear at baseline). HR Restrictions Weight Bearing Restrictions: No     Mobility  Bed Mobility Overal bed mobility: Needs Assistance Bed Mobility: Sit to Supine       Sit to supine: Supervision   General bed mobility comments: up in chair on arrival. Supervision to return to supine    Transfers Overall transfer level: Needs assistance Equipment used: Rollator (4 wheels) Transfers: Sit to/from  Stand Sit to Stand: Supervision           General transfer comment: cues for brake management with poor follow through    Ambulation/Gait Ambulation/Gait assistance: Min guard, Supervision Gait Distance (Feet): 120 Feet Assistive device: Rollator (4 wheels) Gait Pattern/deviations: Step-through pattern, Decreased stride length Gait velocity: decreased     General Gait Details: min guard initially progressing to supervision. No overt LOB. Slow steady gait speed   Stairs             Wheelchair Mobility    Modified Rankin (Stroke Patients Only)       Balance Overall balance assessment: Needs assistance, History of Falls Sitting-balance support: No upper extremity supported, Feet supported Sitting balance-Leahy Scale: Good     Standing balance support: Bilateral upper extremity supported, During functional activity Standing balance-Leahy Scale: Fair                              Cognition Arousal/Alertness: Awake/alert Behavior During Therapy: WFL for tasks assessed/performed Overall Cognitive Status: History of cognitive impairments - at baseline                                 General Comments: hx of dementia, pleasant and follows directions consistently.memory deficits        Exercises      General Comments General comments (skin integrity, edema, etc.): SpO2 >92% on 3L O2 during ambulation      Pertinent Vitals/Pain Pain Assessment Pain Assessment: No/denies pain    Home Living  Prior Function            PT Goals (current goals can now be found in the care plan section) Acute Rehab PT Goals PT Goal Formulation: With patient Time For Goal Achievement: 06/25/22 Potential to Achieve Goals: Good Progress towards PT goals: Progressing toward goals    Frequency    Min 3X/week      PT Plan Current plan remains appropriate    Co-evaluation              AM-PAC PT "6  Clicks" Mobility   Outcome Measure  Help needed turning from your back to your side while in a flat bed without using bedrails?: None Help needed moving from lying on your back to sitting on the side of a flat bed without using bedrails?: A Little Help needed moving to and from a bed to a chair (including a wheelchair)?: A Little Help needed standing up from a chair using your arms (e.g., wheelchair or bedside chair)?: A Little Help needed to walk in hospital room?: A Little Help needed climbing 3-5 steps with a railing? : A Little 6 Click Score: 19    End of Session Equipment Utilized During Treatment: Oxygen Activity Tolerance: Patient tolerated treatment well Patient left: in bed;with call bell/phone within reach;with bed alarm set;with family/visitor present Nurse Communication: Mobility status PT Visit Diagnosis: Difficulty in walking, not elsewhere classified (R26.2)     Time: 7373-6681 PT Time Calculation (min) (ACUTE ONLY): 24 min  Charges:  $Gait Training: 23-37 mins                     Satoru Milich A. Gilford Rile PT, DPT Acute Rehabilitation Services Office [phone]    Linna Hoff 06/13/2022, 4:48 PM

## 2022-06-14 DIAGNOSIS — J9601 Acute respiratory failure with hypoxia: Secondary | ICD-10-CM | POA: Diagnosis not present

## 2022-06-14 DIAGNOSIS — J189 Pneumonia, unspecified organism: Secondary | ICD-10-CM | POA: Diagnosis not present

## 2022-06-14 DIAGNOSIS — A419 Sepsis, unspecified organism: Secondary | ICD-10-CM | POA: Diagnosis not present

## 2022-06-14 DIAGNOSIS — D649 Anemia, unspecified: Secondary | ICD-10-CM | POA: Diagnosis not present

## 2022-06-14 LAB — CBC WITH DIFFERENTIAL/PLATELET
Abs Immature Granulocytes: 0.15 10*3/uL — ABNORMAL HIGH (ref 0.00–0.07)
Basophils Absolute: 0 10*3/uL (ref 0.0–0.1)
Basophils Relative: 0 %
Eosinophils Absolute: 0 10*3/uL (ref 0.0–0.5)
Eosinophils Relative: 0 %
HCT: 30.5 % — ABNORMAL LOW (ref 36.0–46.0)
Hemoglobin: 9.6 g/dL — ABNORMAL LOW (ref 12.0–15.0)
Immature Granulocytes: 1 %
Lymphocytes Relative: 8 %
Lymphs Abs: 1.3 10*3/uL (ref 0.7–4.0)
MCH: 25.9 pg — ABNORMAL LOW (ref 26.0–34.0)
MCHC: 31.5 g/dL (ref 30.0–36.0)
MCV: 82.4 fL (ref 80.0–100.0)
Monocytes Absolute: 1.1 10*3/uL — ABNORMAL HIGH (ref 0.1–1.0)
Monocytes Relative: 7 %
Neutro Abs: 13.2 10*3/uL — ABNORMAL HIGH (ref 1.7–7.7)
Neutrophils Relative %: 84 %
Platelets: 451 10*3/uL — ABNORMAL HIGH (ref 150–400)
RBC: 3.7 MIL/uL — ABNORMAL LOW (ref 3.87–5.11)
RDW: 13.9 % (ref 11.5–15.5)
WBC: 15.7 10*3/uL — ABNORMAL HIGH (ref 4.0–10.5)
nRBC: 0 % (ref 0.0–0.2)

## 2022-06-14 LAB — COMPREHENSIVE METABOLIC PANEL
ALT: 9 U/L (ref 0–44)
AST: 14 U/L — ABNORMAL LOW (ref 15–41)
Albumin: 2.1 g/dL — ABNORMAL LOW (ref 3.5–5.0)
Alkaline Phosphatase: 68 U/L (ref 38–126)
Anion gap: 6 (ref 5–15)
BUN: 14 mg/dL (ref 8–23)
CO2: 30 mmol/L (ref 22–32)
Calcium: 8.7 mg/dL — ABNORMAL LOW (ref 8.9–10.3)
Chloride: 100 mmol/L (ref 98–111)
Creatinine, Ser: 1.42 mg/dL — ABNORMAL HIGH (ref 0.44–1.00)
GFR, Estimated: 35 mL/min — ABNORMAL LOW (ref >60)
Glucose, Bld: 172 mg/dL — ABNORMAL HIGH (ref 70–99)
Potassium: 5 mmol/L (ref 3.5–5.1)
Sodium: 136 mmol/L (ref 135–145)
Total Bilirubin: 0.2 mg/dL — ABNORMAL LOW (ref 0.3–1.2)
Total Protein: 6.5 g/dL (ref 6.5–8.1)

## 2022-06-14 LAB — CULTURE, BLOOD (ROUTINE X 2)
Culture: NO GROWTH
Culture: NO GROWTH

## 2022-06-14 LAB — GLUCOSE, CAPILLARY
Glucose-Capillary: 102 mg/dL — ABNORMAL HIGH (ref 70–99)
Glucose-Capillary: 208 mg/dL — ABNORMAL HIGH (ref 70–99)
Glucose-Capillary: 317 mg/dL — ABNORMAL HIGH (ref 70–99)
Glucose-Capillary: 175 mg/dL — ABNORMAL HIGH (ref 70–99)

## 2022-06-14 LAB — MAGNESIUM: Magnesium: 1.9 mg/dL (ref 1.7–2.4)

## 2022-06-14 LAB — PHOSPHORUS: Phosphorus: 3.1 mg/dL (ref 2.5–4.6)

## 2022-06-14 MED ORDER — BUTALBITAL-APAP-CAFFEINE 50-325-40 MG PO TABS
1.0000 | ORAL_TABLET | Freq: Four times a day (QID) | ORAL | Status: DC | PRN
Start: 2022-06-14 — End: 2022-06-21
  Filled 2022-06-14: qty 1

## 2022-06-14 MED ORDER — IPRATROPIUM-ALBUTEROL 0.5-2.5 (3) MG/3ML IN SOLN
3.0000 mL | Freq: Three times a day (TID) | RESPIRATORY_TRACT | Status: DC
Start: 2022-06-14 — End: 2022-06-14

## 2022-06-14 MED ORDER — IPRATROPIUM-ALBUTEROL 0.5-2.5 (3) MG/3ML IN SOLN
3.0000 mL | Freq: Three times a day (TID) | RESPIRATORY_TRACT | Status: DC
  Administered 2022-06-14 – 2022-06-15 (×3): 3 mL via RESPIRATORY_TRACT
  Filled 2022-06-14 (×3): qty 3

## 2022-06-14 MED ORDER — TRAZODONE HCL 50 MG PO TABS
100.0000 mg | ORAL_TABLET | Freq: Every day | ORAL | Status: DC
  Administered 2022-06-14 – 2022-06-20 (×7): 100 mg via ORAL
  Filled 2022-06-14 (×7): qty 2

## 2022-06-14 NOTE — Progress Notes (Signed)
Mobility Specialist Criteria Algorithm Info.   06/14/22 1130  Oxygen Therapy  SpO2 (!) 85 %  O2 Device Nasal Cannula  Pulse Oximetry Type Continuous  Mobility  Activity Ambulated with assistance in hallway (in chair before and after)  Range of Motion/Exercises Active;All extremities  Level of Assistance Contact guard assist, steadying assist  Assistive Device Front wheel walker  Distance Ambulated (ft) 120 ft  Activity Response Tolerated fair;RN notified   Pre Mobility:  HR 101,  SpO2 99% 3L02 During Mobility: HR 121, SpO2 85% 3LO2 Post Mobility: HR 120, SpO2 90% 3LO2  Patient received in recliner, agreeable to participate in mobility despite not feeling well. Ambulated min guard with slow gait. Distance limited secondary to fatigue, dyspnea and tachycardia. SpO2 desaturated to 85% on 3LO2, requiring cues for pursed lip breathing. Oxygen saturation did not recover and lingered mid 80's throughout. Upon returning to room pt had coughing spell in which SpO2 desaturated to 77% and slowly recovered to 90% with seated rest. Was left in recliner with all needs met, call bell in reach.   Melissa [name], [place], [place]  OBSJG:[phone] Office: [phone]

## 2022-06-14 NOTE — Progress Notes (Signed)
SATURATION QUALIFICATIONS: (This note is used to comply with regulatory documentation for home oxygen)  Patient Saturations on Room Air at Rest = n/a  Patient Saturations on Room Air while Ambulating = n/a  Patient Saturations on 3 Liters of oxygen while Ambulating = 88%  Please briefly explain why patient needs home oxygen:

## 2022-06-14 NOTE — Progress Notes (Signed)
Melissa [name] HCW:[id number] DOB: May 12, 1928 DOA: 06/09/2022 PCP: Darreld Mclean, MD   Subj: 88 year old WF PMHx chronic systolic CHF, stage IV CKD baseline creatinine 1.7, DM Type 2, paroxysmal A-fib on Eliquis, Dementia   comes to the hospital with concerns of multifocal pneumonia and generalized weakness.  Patient states started feeling weak about a week ago.  ED chest x-ray showed multifocal pneumonia, she was started on IV Rocephin and azithromycin.  BNP was negative.  COVID-19/flu-negative due to concerns of aspiration she was seen by speech therapist and eventually cleared for regular diet.   Obj: 9/1 afebrile overnight HA refractory to Tylenol.    Objective: VITAL SIGNS: Temp: 98.6 F (37 C) (09/01 0813) Temp Source: Oral (09/01 0813) BP: 122/66 (09/01 0828) Pulse Rate: 102 (09/01 0828) SPO2; 95% FIO2: 3 L O2 via [place]   Intake/Output Summary (Last 24 hours) at 06/14/2022 1437 Last data filed at 06/14/2022 1300 Gross per 24 hour  Intake 360 ml  Output --  Net 360 ml      Exam: General: A/O x4, positive acute respiratory distress Lungs: decreased breath sounds bilaterally, diffuse expiratory wheezes, negative crackles Cardiovascular: Regular rate and rhythm without murmur gallop or rub normal S1 and S2 Abdomen: Nontender, nondistended, soft, bowel sounds positive, no rebound, no ascites, no appreciable mass Extremities: No significant cyanosis, clubbing, or edema bilateral lower extremities Skin: Negative rashes, lesions, ulcers Psychiatric:  Negative depression, negative anxiety, negative fatigue, negative mania  Central nervous system:  Cranial nerves II through XII intact, tongue/uvula midline, all extremities muscle strength 5/5, sensation intact throughout, negative dysarthria, negative expressive aphasia, negative receptive aphasia.  .   Mobility Assessment (last 72 hours)     Mobility Assessment     Row Name 06/13/22 1646 06/13/22 1200 06/12/22 1400  06/12/22 0830 06/11/22 2104   Does patient have an order for bedrest or is patient medically unstable -- -- -- No - Continue assessment No - Continue assessment   What is the highest level of mobility based on the progressive mobility assessment? Level 5 (Walks with assist in room/hall) - Balance while stepping forward/back and can walk in room with assist - Complete Level 5 (Walks with assist in room/hall) - Balance while stepping forward/back and can walk in room with assist - Complete Level 5 (Walks with assist in room/hall) - Balance while stepping forward/back and can walk in room with assist - Complete Level 5 (Walks with assist in room/hall) - Balance while stepping forward/back and can walk in room with assist - Complete Level 5 (Walks with assist in room/hall) - Balance while stepping forward/back and can walk in room with assist - Complete    Row Name 06/11/22 2030           Does patient have an order for bedrest or is patient medically unstable No - Continue assessment       What is the highest level of mobility based on the progressive mobility assessment? Level 5 (Walks with assist in room/hall) - Balance while stepping forward/back and can walk in room with assist - Complete                  DVT prophylaxis: Eliquis Code Status: Full Family Communication: 9/1 daughter at bedside for discussion plan of care all questions answered Status is: Inpatient    Dispo: The patient is from: Home              Anticipated d/c is to: Home  Anticipated d/c date is: > 3 days              Patient currently is not medically stable to d/c.      Consultants:     Cultures 8/27 SARS coronavirus negative 8/27 influenza A/B  negative 8/28 positive parainfluenza   Antimicrobials: Anti-infectives (From admission, onward)    Start     Ordered Stop   06/09/22 1915  cefTRIAXone (ROCEPHIN) 2 g in sodium chloride 0.9 % 100 mL IVPB  Status:  Discontinued        06/09/22  1913 06/11/22 0806   06/09/22 1915  azithromycin (ZITHROMAX) 500 mg in sodium chloride 0.9 % 250 mL IVPB  Status:  Discontinued        06/09/22 1913 06/11/22 0806          A/P  positive parainfluenza pneumonia -Solu-Medrol 60 mg daily - Incentive spirometry - Flutter valve - DuoNeb QID - Mucinex DM BID - Continuous pulse ox - Titrate O2 to maintain SPO2> 93%   Acute respiratory failure with hypoxia/multifocal pneumonia -See parainfluenza pneumonia   hypomagnesemia - Replete as needed   Nausea and Vomiting -Will order Esophogram    Chronic Systolic CHF  - Echo July 2022 showed EF of 20%.   -Strict ins and out +3.1 L - Daily weight  Paroxysmal A-fib -See CHF - On Eliquis   CKD stage IV(Baseline creatinine 1.3.) Lab Results  Component Value Date   CREATININE 1.42 (H) 06/14/2022   CREATININE 1.29 (H) 06/13/2022   CREATININE 1.21 (H) 06/12/2022   CREATININE 1.20 (H) 06/11/2022   CREATININE 1.51 (H) 06/10/2022  -Baseline   Diabetes mellitus type 2 uncontrolled with Hyperglycemia, insulin-dependent -8/27 hemoglobin A1c= 8.5 - Levemir 12 units daily - Sensitive SSI CBG (last 3)  Recent Labs    06/13/22 1958 06/14/22 0819 06/14/22 1157  GLUCAP 201* 102* 208*        Restless leg syndrome - On Mirapex   Anemia of chronic disease - Baseline hemoglobin 11.   -Transfuse for hemoglobin<7 Lab Results  Component Value Date   HGB 9.6 (L) 06/14/2022   HGB 9.5 (L) 06/13/2022   HGB 9.0 (L) 06/12/2022   HGB 9.1 (L) 06/11/2022   HGB 9.1 (L) 06/10/2022  -Stable  Refractory HA - Fioricet PRN    Care during the described time interval was provided by me .  I have reviewed this patient's available data, including medical history, events of note, physical examination, and all test results as part of my evaluation.

## 2022-06-14 NOTE — Progress Notes (Signed)
   06/14/22 0813  Assess: MEWS Score  Temp 98.6 F (37 C)  BP (!) 142/94  MAP (mmHg) 110  Pulse Rate 84  ECG Heart Rate (!) 120  Resp 19  Assess: MEWS Score  MEWS Temp 0  MEWS Systolic 0  MEWS Pulse 2  MEWS RR 0  MEWS LOC 0  MEWS Score 2  MEWS Score Color Yellow  Assess: if the MEWS score is Yellow or Red  Were vital signs taken at a resting state? Yes  Focused Assessment No change from prior assessment  Does the patient meet 2 or more of the SIRS criteria? No  Does the patient have a confirmed or suspected source of infection? Yes  Provider and Rapid Response Notified? No  MEWS guidelines implemented *See Row Information* No, previously yellow, continue vital signs every 4 hours (Metoprolo given PRN.)  Assess: SIRS CRITERIA  SIRS Temperature  0  SIRS Pulse 1  SIRS Respirations  0  SIRS WBC 1  SIRS Score Sum  2   Metoprolol given PRN. Current HR is 100.

## 2022-06-15 DIAGNOSIS — J189 Pneumonia, unspecified organism: Secondary | ICD-10-CM | POA: Diagnosis not present

## 2022-06-15 DIAGNOSIS — J9601 Acute respiratory failure with hypoxia: Secondary | ICD-10-CM | POA: Diagnosis not present

## 2022-06-15 DIAGNOSIS — D649 Anemia, unspecified: Secondary | ICD-10-CM | POA: Diagnosis not present

## 2022-06-15 DIAGNOSIS — A419 Sepsis, unspecified organism: Secondary | ICD-10-CM | POA: Diagnosis not present

## 2022-06-15 LAB — CBC WITH DIFFERENTIAL/PLATELET
Abs Immature Granulocytes: 0.11 10*3/uL — ABNORMAL HIGH (ref 0.00–0.07)
Basophils Absolute: 0 10*3/uL (ref 0.0–0.1)
Basophils Relative: 0 %
Eosinophils Absolute: 0 10*3/uL (ref 0.0–0.5)
Eosinophils Relative: 0 %
HCT: 27.9 % — ABNORMAL LOW (ref 36.0–46.0)
Hemoglobin: 9.2 g/dL — ABNORMAL LOW (ref 12.0–15.0)
Immature Granulocytes: 1 %
Lymphocytes Relative: 9 %
Lymphs Abs: 1.1 10*3/uL (ref 0.7–4.0)
MCH: 26.4 pg (ref 26.0–34.0)
MCHC: 33 g/dL (ref 30.0–36.0)
MCV: 80.2 fL (ref 80.0–100.0)
Monocytes Absolute: 1 10*3/uL (ref 0.1–1.0)
Monocytes Relative: 8 %
Neutro Abs: 9.9 10*3/uL — ABNORMAL HIGH (ref 1.7–7.7)
Neutrophils Relative %: 82 %
Platelets: 376 10*3/uL (ref 150–400)
RBC: 3.48 MIL/uL — ABNORMAL LOW (ref 3.87–5.11)
RDW: 14 % (ref 11.5–15.5)
WBC: 12.2 10*3/uL — ABNORMAL HIGH (ref 4.0–10.5)
nRBC: 0 % (ref 0.0–0.2)

## 2022-06-15 LAB — COMPREHENSIVE METABOLIC PANEL
ALT: 11 U/L (ref 0–44)
AST: 12 U/L — ABNORMAL LOW (ref 15–41)
Albumin: 2 g/dL — ABNORMAL LOW (ref 3.5–5.0)
Alkaline Phosphatase: 61 U/L (ref 38–126)
Anion gap: 7 (ref 5–15)
BUN: 15 mg/dL (ref 8–23)
CO2: 27 mmol/L (ref 22–32)
Calcium: 8.5 mg/dL — ABNORMAL LOW (ref 8.9–10.3)
Chloride: 99 mmol/L (ref 98–111)
Creatinine, Ser: 1.43 mg/dL — ABNORMAL HIGH (ref 0.44–1.00)
GFR, Estimated: 34 mL/min — ABNORMAL LOW (ref >60)
Glucose, Bld: 143 mg/dL — ABNORMAL HIGH (ref 70–99)
Potassium: 4.6 mmol/L (ref 3.5–5.1)
Sodium: 133 mmol/L — ABNORMAL LOW (ref 135–145)
Total Bilirubin: 0.3 mg/dL (ref 0.3–1.2)
Total Protein: 5.9 g/dL — ABNORMAL LOW (ref 6.5–8.1)

## 2022-06-15 LAB — GLUCOSE, CAPILLARY
Glucose-Capillary: 111 mg/dL — ABNORMAL HIGH (ref 70–99)
Glucose-Capillary: 152 mg/dL — ABNORMAL HIGH (ref 70–99)
Glucose-Capillary: 272 mg/dL — ABNORMAL HIGH (ref 70–99)
Glucose-Capillary: 337 mg/dL — ABNORMAL HIGH (ref 70–99)

## 2022-06-15 LAB — PHOSPHORUS: Phosphorus: 3.1 mg/dL (ref 2.5–4.6)

## 2022-06-15 LAB — MAGNESIUM: Magnesium: 1.8 mg/dL (ref 1.7–2.4)

## 2022-06-15 MED ORDER — LEVALBUTEROL HCL 1.25 MG/0.5ML IN NEBU
1.2500 mg | INHALATION_SOLUTION | Freq: Three times a day (TID) | RESPIRATORY_TRACT | Status: DC
  Administered 2022-06-15 – 2022-06-16 (×3): 1.25 mg via RESPIRATORY_TRACT
  Filled 2022-06-15 (×3): qty 0.5

## 2022-06-15 MED ORDER — METOPROLOL TARTRATE 12.5 MG HALF TABLET
12.5000 mg | ORAL_TABLET | Freq: Two times a day (BID) | ORAL | Status: DC
  Administered 2022-06-15 – 2022-06-21 (×13): 12.5 mg via ORAL
  Filled 2022-06-15 (×13): qty 1

## 2022-06-15 NOTE — Progress Notes (Signed)
Melissa [name]:[id number] DOB: [birth year] DOA: 06/09/2022 PCP: Darreld Mclean, MD   Subj: 88 year old WF PMHx chronic systolic CHF, stage IV CKD baseline creatinine 1.7, DM Type 2, paroxysmal A-fib on Eliquis, Dementia   comes to the hospital with concerns of multifocal pneumonia and generalized weakness.  Patient states started feeling weak about a week ago.  ED chest x-ray showed multifocal pneumonia, she was started on IV Rocephin and azithromycin.  BNP was negative.  COVID-19/flu-negative due to concerns of aspiration she was seen by speech therapist and eventually cleared for regular diet.   Obj: 9/2 afebrile overnight A/O x4.  Headache resolved.  Negative CP, negative SOB,    Objective: VITAL SIGNS: Temp: 97.8 F (36.6 C) (09/02 0756) Temp Source: Oral (09/02 0422) BP: 131/59 (09/02 0756) Pulse Rate: 114 (09/02 0756) SPO2; 95% FIO2: 3 L O2 via [place]   Intake/Output Summary (Last 24 hours) at 06/15/2022 1113 Last data filed at 06/14/2022 1300 Gross per 24 hour  Intake 120 ml  Output --  Net 120 ml       Exam: General: A/O x4, positive acute respiratory distress Lungs: clear to auscultation bilaterally, negative wheezes, mild diffuse crackles Cardiovascular: Regular rate and rhythm without murmur gallop or rub normal S1 and S2 Abdomen: Nontender, nondistended, soft, bowel sounds positive, no rebound, no ascites, no appreciable mass Extremities: No significant cyanosis, clubbing, or edema bilateral lower extremities Skin: Negative rashes, lesions, ulcers Psychiatric:  Negative depression, negative anxiety, negative fatigue, negative mania  Central nervous system:  Cranial nerves II through XII intact, tongue/uvula midline, all extremities muscle strength 5/5, sensation intact throughout, negative dysarthria, negative expressive aphasia, negative receptive aphasia.  .   Mobility Assessment (last 72 hours)     Mobility Assessment     Row Name 06/13/22 1646 06/13/22  1200 06/12/22 1400       What is the highest level of mobility based on the progressive mobility assessment? Level 5 (Walks with assist in room/hall) - Balance while stepping forward/back and can walk in room with assist - Complete Level 5 (Walks with assist in room/hall) - Balance while stepping forward/back and can walk in room with assist - Complete Level 5 (Walks with assist in room/hall) - Balance while stepping forward/back and can walk in room with assist - Complete                DVT prophylaxis: Eliquis Code Status: Full Family Communication: 9/2 daughter at bedside for discussion plan of care all questions answered Status is: Inpatient    Dispo: The patient is from: Home              Anticipated d/c is to: Home              Anticipated d/c date is: > 3 days              Patient currently is not medically stable to d/c.      Consultants:     Cultures 8/27 SARS coronavirus negative 8/27 influenza A/B  negative 8/28 positive parainfluenza   Antimicrobials: Anti-infectives (From admission, onward)    Start     Ordered Stop   06/09/22 1915  cefTRIAXone (ROCEPHIN) 2 g in sodium chloride 0.9 % 100 mL IVPB  Status:  Discontinued        06/09/22 1913 06/11/22 0806   06/09/22 1915  azithromycin (ZITHROMAX) 500 mg in sodium chloride 0.9 % 250 mL IVPB  Status:  Discontinued  06/09/22 1913 06/11/22 0806          A/P  positive parainfluenza pneumonia -Solu-Medrol 60 mg daily - Incentive spirometry - Flutter valve - 9/2 DC DuoNeb QID - Mucinex DM BID -9/2 Xopenex nebulizer TID.  Titrate down as tolerated given patient's episodes of MAT - Continuous pulse ox - Titrate O2 to maintain SPO2> 93% -9/3 PCXR pending   Acute respiratory failure with hypoxia/multifocal pneumonia -See parainfluenza pneumonia     Nausea and Vomiting -Will order Esophogram    Chronic Systolic CHF  - Echo July 2022 showed EF of 20%.   -9/2 Metoprolol 12.5 mg BID.  PER  daughter patient sensitive to BB -9/2 see pneumonia -Strict ins and out +3.1 L - Daily weight Filed Weights   06/12/22 0500 06/13/22 0450 06/14/22 0500  Weight: 66 kg 65 kg 66 kg  -9/2 attempt to maintain patient euvolemic   Paroxysmal A-fib -See CHF - On Eliquis   CKD stage IV(Baseline creatinine 1.3.) Lab Results  Component Value Date   CREATININE 1.43 (H) 06/15/2022   CREATININE 1.42 (H) 06/14/2022   CREATININE 1.29 (H) 06/13/2022   CREATININE 1.21 (H) 06/12/2022   CREATININE 1.20 (H) 06/11/2022  -Baseline   Diabetes mellitus type 2 uncontrolled with Hyperglycemia, insulin-dependent -8/27 hemoglobin A1c= 8.5 - Levemir 12 units daily - Sensitive SSI CBG (last 3)  Recent Labs    06/14/22 2055 06/15/22 0752 06/15/22 1105  GLUCAP 175* 111* 152*        Restless leg syndrome - On Mirapex   Anemia of chronic disease - Baseline hemoglobin 11.   -Transfuse for hemoglobin<7 Lab Results  Component Value Date   HGB 9.2 (L) 06/15/2022   HGB 9.6 (L) 06/14/2022   HGB 9.5 (L) 06/13/2022   HGB 9.0 (L) 06/12/2022   HGB 9.1 (L) 06/11/2022  -Stable  Refractory HA - Fioricet PRN -9/2 resolved  Hypomagnesemia - Replete as needed    Care during the described time interval was provided by me .  I have reviewed this patient's available data, including medical history, events of note, physical examination, and all test results as part of my evaluation.

## 2022-06-15 NOTE — Progress Notes (Signed)
Mobility Specialist Criteria Algorithm Info.   06/15/22 1020  Mobility  Activity Transferred from bed to chair  Range of Motion/Exercises Active;All extremities  Level of Assistance Contact guard assist, steadying assist  Assistive Device Front wheel walker  Distance Ambulated (ft) 2 ft  Activity Response Tolerated fair   Patient received in bed, reluctantly agreed to participate in mobility. Actively having coughing spell upon entry that improved with dangling EOB. HR elevated 130-154 while coughing and SpO2 lingering 88-90% on 3LO2. Declined ambulation but agreed to sit in recliner.  Transferred to recliner with min guard assist. Tolerated without incident. Breathing improved after transfer. Was left in chair with all needs met, call bell in reach.   [place] Tzivia Oneil, [place], [place]  VTXLE:[phone] Office: [phone]

## 2022-06-15 NOTE — Discharge Instructions (Signed)

## 2022-06-16 ENCOUNTER — Inpatient Hospital Stay (HOSPITAL_COMMUNITY): Payer: Medicare Other

## 2022-06-16 DIAGNOSIS — A419 Sepsis, unspecified organism: Secondary | ICD-10-CM | POA: Diagnosis not present

## 2022-06-16 DIAGNOSIS — J189 Pneumonia, unspecified organism: Secondary | ICD-10-CM | POA: Diagnosis not present

## 2022-06-16 DIAGNOSIS — D649 Anemia, unspecified: Secondary | ICD-10-CM | POA: Diagnosis not present

## 2022-06-16 DIAGNOSIS — J9601 Acute respiratory failure with hypoxia: Secondary | ICD-10-CM | POA: Diagnosis not present

## 2022-06-16 LAB — COMPREHENSIVE METABOLIC PANEL
ALT: 12 U/L (ref 0–44)
AST: 14 U/L — ABNORMAL LOW (ref 15–41)
Albumin: 1.9 g/dL — ABNORMAL LOW (ref 3.5–5.0)
Alkaline Phosphatase: 59 U/L (ref 38–126)
Anion gap: 5 (ref 5–15)
BUN: 17 mg/dL (ref 8–23)
CO2: 28 mmol/L (ref 22–32)
Calcium: 8.4 mg/dL — ABNORMAL LOW (ref 8.9–10.3)
Chloride: 100 mmol/L (ref 98–111)
Creatinine, Ser: 1.32 mg/dL — ABNORMAL HIGH (ref 0.44–1.00)
GFR, Estimated: 38 mL/min — ABNORMAL LOW (ref >60)
Glucose, Bld: 207 mg/dL — ABNORMAL HIGH (ref 70–99)
Potassium: 4.7 mmol/L (ref 3.5–5.1)
Sodium: 133 mmol/L — ABNORMAL LOW (ref 135–145)
Total Bilirubin: 0.3 mg/dL (ref 0.3–1.2)
Total Protein: 5.8 g/dL — ABNORMAL LOW (ref 6.5–8.1)

## 2022-06-16 LAB — CBC WITH DIFFERENTIAL/PLATELET
Abs Immature Granulocytes: 0.08 10*3/uL — ABNORMAL HIGH (ref 0.00–0.07)
Basophils Absolute: 0 10*3/uL (ref 0.0–0.1)
Basophils Relative: 0 %
Eosinophils Absolute: 0 10*3/uL (ref 0.0–0.5)
Eosinophils Relative: 0 %
HCT: 27.5 % — ABNORMAL LOW (ref 36.0–46.0)
Hemoglobin: 9 g/dL — ABNORMAL LOW (ref 12.0–15.0)
Immature Granulocytes: 1 %
Lymphocytes Relative: 10 %
Lymphs Abs: 1 10*3/uL (ref 0.7–4.0)
MCH: 26.5 pg (ref 26.0–34.0)
MCHC: 32.7 g/dL (ref 30.0–36.0)
MCV: 81.1 fL (ref 80.0–100.0)
Monocytes Absolute: 0.7 10*3/uL (ref 0.1–1.0)
Monocytes Relative: 7 %
Neutro Abs: 7.5 10*3/uL (ref 1.7–7.7)
Neutrophils Relative %: 82 %
Platelets: 355 10*3/uL (ref 150–400)
RBC: 3.39 MIL/uL — ABNORMAL LOW (ref 3.87–5.11)
RDW: 14 % (ref 11.5–15.5)
WBC: 9.2 10*3/uL (ref 4.0–10.5)
nRBC: 0 % (ref 0.0–0.2)

## 2022-06-16 LAB — GLUCOSE, CAPILLARY
Glucose-Capillary: 141 mg/dL — ABNORMAL HIGH (ref 70–99)
Glucose-Capillary: 178 mg/dL — ABNORMAL HIGH (ref 70–99)
Glucose-Capillary: 229 mg/dL — ABNORMAL HIGH (ref 70–99)
Glucose-Capillary: 196 mg/dL — ABNORMAL HIGH (ref 70–99)

## 2022-06-16 LAB — PHOSPHORUS: Phosphorus: 3.3 mg/dL (ref 2.5–4.6)

## 2022-06-16 LAB — MAGNESIUM: Magnesium: 1.9 mg/dL (ref 1.7–2.4)

## 2022-06-16 LAB — PROCALCITONIN: Procalcitonin: <0.1 ng/mL

## 2022-06-16 MED ORDER — LEVALBUTEROL HCL 1.25 MG/0.5ML IN NEBU: 1.2500 mg | INHALATION_SOLUTION | Freq: Four times a day (QID) | RESPIRATORY_TRACT | Status: DC | PRN

## 2022-06-16 MED ORDER — LEVALBUTEROL HCL 1.25 MG/0.5ML IN NEBU
1.2500 mg | INHALATION_SOLUTION | Freq: Two times a day (BID) | RESPIRATORY_TRACT | Status: DC
  Administered 2022-06-16 – 2022-06-19 (×5): 1.25 mg via RESPIRATORY_TRACT
  Filled 2022-06-16 (×9): qty 0.5

## 2022-06-16 MED ORDER — HYDRALAZINE HCL 20 MG/ML IJ SOLN: 10.0000 mg | INTRAMUSCULAR | Status: DC | PRN

## 2022-06-16 MED ORDER — ALBUMIN HUMAN 25 % IV SOLN
50.0000 g | Freq: Once | INTRAVENOUS | Status: AC
  Administered 2022-06-16: 50 g via INTRAVENOUS
  Filled 2022-06-16: qty 200

## 2022-06-16 MED ORDER — SODIUM CHLORIDE 0.9 % IV SOLN
1.0000 g | INTRAVENOUS | Status: DC
  Administered 2022-06-16 – 2022-06-20 (×5): 1 g via INTRAVENOUS
  Filled 2022-06-16 (×6): qty 10

## 2022-06-16 NOTE — Progress Notes (Signed)
Melissa [name] OZD:[id number] DOB: 20-Nov-1927 DOA: 06/09/2022 PCP: Darreld Mclean, MD   Subj: 88 year old WF PMHx chronic systolic CHF, stage IV CKD baseline creatinine 1.7, DM Type 2, paroxysmal A-fib on Eliquis, Dementia   comes to the hospital with concerns of multifocal pneumonia and generalized weakness.  Patient states started feeling weak about a week ago.  ED chest x-ray showed multifocal pneumonia, she was started on IV Rocephin and azithromycin.  BNP was negative.  COVID-19/flu-negative due to concerns of aspiration she was seen by speech therapist and eventually cleared for regular diet.   Obj: 9/3 afebrile overnight A/O x4, positive DOE, sitting in chair comfortably.    Objective: VITAL SIGNS: Temp: 98.2 F (36.8 C) (09/03 0910) Temp Source: Oral (09/03 0910) BP: 112/57 (09/03 0910) Pulse Rate: 107 (09/03 0858) SPO2; 95% FIO2: 3 L O2 via [place]  No intake or output data in the 24 hours ending 06/16/22 1348     Exam: General: A/O x4, positive acute respiratory distress Lungs: decreased breath sounds bilaterally, diffuse bilateral expiratory wheezes, mild diffuse crackles Cardiovascular: Regular rate and rhythm without murmur gallop or rub normal S1 and S2 Abdomen: Nontender, nondistended, soft, bowel sounds positive, no rebound, no ascites, no appreciable mass Extremities: No significant cyanosis, clubbing, or edema bilateral lower extremities Skin: Negative rashes, lesions, ulcers Psychiatric:  Negative depression, negative anxiety, negative fatigue, negative mania  Central nervous system:  Cranial nerves II through XII intact, tongue/uvula midline, all extremities muscle strength 5/5, sensation intact throughout, negative dysarthria, negative expressive aphasia, negative receptive aphasia.  .   Mobility Assessment (last 72 hours)     Mobility Assessment     Row Name 06/15/22 1254 06/15/22 1020 06/13/22 1646       Does patient have an order for bedrest or is  patient medically unstable No - Continue assessment No - Continue assessment --     What is the highest level of mobility based on the progressive mobility assessment? Level 5 (Walks with assist in room/hall) - Balance while stepping forward/back and can walk in room with assist - Complete Level 5 (Walks with assist in room/hall) - Balance while stepping forward/back and can walk in room with assist - Complete Level 5 (Walks with assist in room/hall) - Balance while stepping forward/back and can walk in room with assist - Complete                DVT prophylaxis: Eliquis Code Status: Full Family Communication: 9/3 daughter in law and son at bedside for discussion plan of care all questions answered Status is: Inpatient    Dispo: The patient is from: Home              Anticipated d/c is to: Home              Anticipated d/c date is: > 3 days              Patient currently is not medically stable to d/c.  Procedures/Significant Events:  9/3 PCXR -Bilateral airspace lung opacities have increased compared to the most recent prior study consistent with worsened multifocal pneumonia  Consultants:     Cultures 8/27 SARS coronavirus negative 8/27 influenza A/B  negative 8/28 positive parainfluenza   Antimicrobials: Anti-infectives (From admission, onward)    Start     Ordered Stop   06/09/22 1915  cefTRIAXone (ROCEPHIN) 2 g in sodium chloride 0.9 % 100 mL IVPB  Status:  Discontinued        06/09/22  1913 06/11/22 0806   06/09/22 1915  azithromycin (ZITHROMAX) 500 mg in sodium chloride 0.9 % 250 mL IVPB  Status:  Discontinued        06/09/22 1913 06/11/22 0806          A/P  positive parainfluenza pneumonia -Solu-Medrol 60 mg daily - Incentive spirometry - Flutter valve - 9/2 DC DuoNeb QID - Mucinex DM BID -9/2 Xopenex nebulizer TID.  Titrate down as tolerated given patient's episodes of MAT - Continuous pulse ox - Titrate O2 to maintain SPO2> 93% -9/3 PCXR     Acute respiratory failure with hypoxia/multifocal pneumonia -See parainfluenza pneumonia   HCAP - 9/3 cefepime x7 - 9/3 procalcitonin pending - Physiotherapy vest BID starting in the A.m. - See parainfluenza pneumonia   Nausea and Vomiting -Will order Esophogram    Chronic Systolic CHF  - Echo July 2022 showed EF of 20%.   -9/2 Metoprolol 12.5 mg BID.  PER daughter patient sensitive to BB -9/2 see pneumonia -Strict ins and out +3.1 L - Daily weight Filed Weights   06/12/22 0500 06/13/22 0450 06/14/22 0500  Weight: 66 kg 65 kg 66 kg  -9/2 attempt to maintain patient euvolemic   Paroxysmal A-fib -See CHF - On Eliquis   CKD stage IV(Baseline creatinine 1.3.) Lab Results  Component Value Date   CREATININE 1.32 (H) 06/16/2022   CREATININE 1.43 (H) 06/15/2022   CREATININE 1.42 (H) 06/14/2022   CREATININE 1.29 (H) 06/13/2022   CREATININE 1.21 (H) 06/12/2022  -Baseline   Diabetes mellitus type 2 uncontrolled with Hyperglycemia, insulin-dependent -8/27 hemoglobin A1c= 8.5 - Levemir 12 units daily - Sensitive SSI CBG (last 3)  Recent Labs    06/15/22 2046 06/16/22 0829 06/16/22 1232  GLUCAP 337* 141* 178*        Restless leg syndrome - On Mirapex   Anemia of chronic disease - Baseline hemoglobin 11.   -Transfuse for hemoglobin<7 Lab Results  Component Value Date   HGB 9.0 (L) 06/16/2022   HGB 9.2 (L) 06/15/2022   HGB 9.6 (L) 06/14/2022   HGB 9.5 (L) 06/13/2022   HGB 9.0 (L) 06/12/2022  -Stable  Refractory HA - Fioricet PRN -9/2 resolved  Hypomagnesemia - Replete as needed  Hypoalbuminemia -9/3 Albumin 50 g   Care during the described time interval was provided by me .  I have reviewed this patient's available data, including medical history, events of note, physical examination, and all test results as part of my evaluation.

## 2022-06-17 DIAGNOSIS — J9601 Acute respiratory failure with hypoxia: Secondary | ICD-10-CM | POA: Diagnosis not present

## 2022-06-17 DIAGNOSIS — J189 Pneumonia, unspecified organism: Secondary | ICD-10-CM | POA: Diagnosis not present

## 2022-06-17 DIAGNOSIS — A419 Sepsis, unspecified organism: Secondary | ICD-10-CM | POA: Diagnosis not present

## 2022-06-17 DIAGNOSIS — D649 Anemia, unspecified: Secondary | ICD-10-CM | POA: Diagnosis not present

## 2022-06-17 LAB — COMPREHENSIVE METABOLIC PANEL
ALT: 9 U/L (ref 0–44)
AST: 12 U/L — ABNORMAL LOW (ref 15–41)
Albumin: 2.9 g/dL — ABNORMAL LOW (ref 3.5–5.0)
Alkaline Phosphatase: 54 U/L (ref 38–126)
Anion gap: 8 (ref 5–15)
BUN: 19 mg/dL (ref 8–23)
CO2: 29 mmol/L (ref 22–32)
Calcium: 8.9 mg/dL (ref 8.9–10.3)
Chloride: 99 mmol/L (ref 98–111)
Creatinine, Ser: 1.33 mg/dL — ABNORMAL HIGH (ref 0.44–1.00)
GFR, Estimated: 38 mL/min — ABNORMAL LOW (ref >60)
Glucose, Bld: 175 mg/dL — ABNORMAL HIGH (ref 70–99)
Potassium: 5.3 mmol/L — ABNORMAL HIGH (ref 3.5–5.1)
Sodium: 136 mmol/L (ref 135–145)
Total Bilirubin: 0.5 mg/dL (ref 0.3–1.2)
Total Protein: 6.5 g/dL (ref 6.5–8.1)

## 2022-06-17 LAB — CBC WITH DIFFERENTIAL/PLATELET
Abs Immature Granulocytes: 0.13 10*3/uL — ABNORMAL HIGH (ref 0.00–0.07)
Basophils Absolute: 0 10*3/uL (ref 0.0–0.1)
Basophils Relative: 0 %
Eosinophils Absolute: 0 10*3/uL (ref 0.0–0.5)
Eosinophils Relative: 0 %
HCT: 28.1 % — ABNORMAL LOW (ref 36.0–46.0)
Hemoglobin: 8.9 g/dL — ABNORMAL LOW (ref 12.0–15.0)
Immature Granulocytes: 1 %
Lymphocytes Relative: 14 %
Lymphs Abs: 1.4 10*3/uL (ref 0.7–4.0)
MCH: 26.3 pg (ref 26.0–34.0)
MCHC: 31.7 g/dL (ref 30.0–36.0)
MCV: 82.9 fL (ref 80.0–100.0)
Monocytes Absolute: 1 10*3/uL (ref 0.1–1.0)
Monocytes Relative: 10 %
Neutro Abs: 7.8 10*3/uL — ABNORMAL HIGH (ref 1.7–7.7)
Neutrophils Relative %: 75 %
Platelets: 352 10*3/uL (ref 150–400)
RBC: 3.39 MIL/uL — ABNORMAL LOW (ref 3.87–5.11)
RDW: 14.3 % (ref 11.5–15.5)
WBC: 10.4 10*3/uL (ref 4.0–10.5)
nRBC: 0 % (ref 0.0–0.2)

## 2022-06-17 LAB — GLUCOSE, CAPILLARY
Glucose-Capillary: 122 mg/dL — ABNORMAL HIGH (ref 70–99)
Glucose-Capillary: 178 mg/dL — ABNORMAL HIGH (ref 70–99)
Glucose-Capillary: 259 mg/dL — ABNORMAL HIGH (ref 70–99)
Glucose-Capillary: 284 mg/dL — ABNORMAL HIGH (ref 70–99)

## 2022-06-17 LAB — PROCALCITONIN: Procalcitonin: <0.1 ng/mL

## 2022-06-17 LAB — PHOSPHORUS: Phosphorus: 3.6 mg/dL (ref 2.5–4.6)

## 2022-06-17 LAB — MAGNESIUM: Magnesium: 1.8 mg/dL (ref 1.7–2.4)

## 2022-06-17 MED ORDER — FUROSEMIDE 10 MG/ML IJ SOLN
40.0000 mg | Freq: Once | INTRAMUSCULAR | Status: AC
  Administered 2022-06-17: 40 mg via INTRAVENOUS
  Filled 2022-06-17: qty 4

## 2022-06-17 NOTE — Progress Notes (Signed)
Mobility Specialist Progress Note   06/17/22 1608  Mobility  Activity Transferred from chair to bed  Level of Assistance Standby assist, set-up cues, supervision of patient - no hands on  Assistive Device Front wheel walker  Distance Ambulated (ft) 4 ft  Activity Response Tolerated well  $Mobility charge 1 Mobility   Pre Mobility: 92 HR During Mobility: 101 HR Post Mobility: 97 HR  Received in bed on 2.5LO2 c/o being tired and requesting to go back to bed. Supervision w/ min cues on hand and foot placement. Left supine w./ call bell in reach, bed alarm on and all needs met.  Holland Falling Mobility Specialist MS [facility] #:  [phone] Acute Rehab Office:  [phone]

## 2022-06-17 NOTE — Progress Notes (Signed)
Melissa [name] GBT:[id number] DOB: 1928/10/14 DOA: 06/09/2022 PCP: Darreld Mclean, MD   Subj: 88 year old WF PMHx chronic systolic CHF, stage IV CKD baseline creatinine 1.7, DM Type 2, paroxysmal A-fib on Eliquis, Dementia   comes to the hospital with concerns of multifocal pneumonia and generalized weakness.  Patient states started feeling weak about a week ago.  ED chest x-ray showed multifocal pneumonia, she was started on IV Rocephin and azithromycin.  BNP was negative.  COVID-19/flu-negative due to concerns of aspiration she was seen by speech therapist and eventually cleared for regular diet.   Obj: 9/4 afebrile overnight A/O x4, positive DOE sitting in chair eating lunch.   Objective: VITAL SIGNS: Temp: 97.7 F (36.5 C) (09/04 0510) Temp Source: Axillary (09/04 0510) BP: 124/71 (09/04 0510) Pulse Rate: 105 (09/04 0909) SPO2; 95% FIO2: 3 L O2 via [place]  No intake or output data in the 24 hours ending 06/17/22 1013     Exam: General: A/O x4, positive acute respiratory distress Lungs: decreased breath sounds bilaterally, diffuse bilateral expiratory wheezes, mild diffuse crackles Cardiovascular: Regular rate and rhythm without murmur gallop or rub normal S1 and S2 Abdomen: Nontender, nondistended, soft, bowel sounds positive, no rebound, no ascites, no appreciable mass Extremities: No significant cyanosis, clubbing, or edema bilateral lower extremities Skin: Negative rashes, lesions, ulcers Psychiatric:  Negative depression, negative anxiety, negative fatigue, negative mania  Central nervous system:  Cranial nerves II through XII intact, tongue/uvula midline, all extremities muscle strength 5/5, sensation intact throughout, negative dysarthria, negative expressive aphasia, negative receptive aphasia.  .   Mobility Assessment (last 72 hours)     Mobility Assessment     Row Name 06/17/22 0837 06/16/22 0910 06/15/22 1254 06/15/22 1020     Does patient have an order for  bedrest or is patient medically unstable -- No - Continue assessment No - Continue assessment No - Continue assessment    What is the highest level of mobility based on the progressive mobility assessment? Level 5 (Walks with assist in room/hall) - Balance while stepping forward/back and can walk in room with assist - Complete Level 5 (Walks with assist in room/hall) - Balance while stepping forward/back and can walk in room with assist - Complete Level 5 (Walks with assist in room/hall) - Balance while stepping forward/back and can walk in room with assist - Complete Level 5 (Walks with assist in room/hall) - Balance while stepping forward/back and can walk in room with assist - Complete               DVT prophylaxis: Eliquis Code Status: Full Family Communication: 9/4 daughter in law and son at bedside for discussion plan of care all questions answered Status is: Inpatient    Dispo: The patient is from: Home              Anticipated d/c is to: Home              Anticipated d/c date is: > 3 days              Patient currently is not medically stable to d/c.  Procedures/Significant Events:  9/3 PCXR -Bilateral airspace lung opacities have increased compared to the most recent prior study consistent with worsened multifocal pneumonia  Consultants:     Cultures 8/27 SARS coronavirus negative 8/27 influenza A/B  negative 8/28 positive parainfluenza   Antimicrobials: Anti-infectives (From admission, onward)    Start     Ordered Stop   06/16/22 1830  ceFEPIme (  MAXIPIME) 1 g in sodium chloride 0.9 % 100 mL IVPB        06/16/22 1742     06/09/22 1915  cefTRIAXone (ROCEPHIN) 2 g in sodium chloride 0.9 % 100 mL IVPB  Status:  Discontinued        06/09/22 1913 06/11/22 0806   06/09/22 1915  azithromycin (ZITHROMAX) 500 mg in sodium chloride 0.9 % 250 mL IVPB  Status:  Discontinued        06/09/22 1913 06/11/22 0806            A/P  positive parainfluenza  pneumonia -Solu-Medrol 60 mg daily - Incentive spirometry - Flutter valve - 9/2 DC DuoNeb QID - Mucinex DM BID -9/2 Xopenex nebulizer TID.  Titrate down as tolerated given patient's episodes of MAT - Continuous pulse ox - Titrate O2 to maintain SPO2> 93% -9/3 PCXR: Consistent with possible bacterial superinfection    Acute respiratory failure with hypoxia/multifocal pneumonia -See parainfluenza pneumonia   HCAP - 9/3 cefepime x7 - 9/3 procalcitonin pending - Physiotherapy vest BID starting in the A.m. - See parainfluenza pneumonia   Nausea and Vomiting -Will order Esophogram    Chronic Systolic CHF  - Echo July 2022 showed EF of 20%.   -9/2 Metoprolol 12.5 mg BID.  PER daughter patient sensitive to BB -9/2 see pneumonia -Strict ins and out +3.7 L - Daily weight Filed Weights   06/12/22 0500 06/13/22 0450 06/14/22 0500  Weight: 66 kg 65 kg 66 kg  -9/2 attempt to maintain patient euvolemic -9/4 Lasix IV 40 mg x 1   Paroxysmal A-fib -See CHF - On Eliquis   CKD stage IV(Baseline creatinine 1.3.) Lab Results  Component Value Date   CREATININE 1.33 (H) 06/17/2022   CREATININE 1.32 (H) 06/16/2022   CREATININE 1.43 (H) 06/15/2022   CREATININE 1.42 (H) 06/14/2022   CREATININE 1.29 (H) 06/13/2022  -Baseline   Diabetes mellitus type 2 uncontrolled with Hyperglycemia, insulin-dependent -8/27 hemoglobin A1c= 8.5 - Levemir 12 units daily - Sensitive SSI CBG (last 3)  Recent Labs    06/16/22 1754 06/16/22 2255 06/17/22 0738  GLUCAP 229* 196* 122*        Restless leg syndrome - On Mirapex   Anemia of chronic disease - Baseline hemoglobin 11.   -Transfuse for hemoglobin<7 Lab Results  Component Value Date   HGB 8.9 (L) 06/17/2022   HGB 9.0 (L) 06/16/2022   HGB 9.2 (L) 06/15/2022   HGB 9.6 (L) 06/14/2022   HGB 9.5 (L) 06/13/2022  -Stable  Refractory HA - Fioricet PRN -9/2 resolved  Hypomagnesemia -Magnesium goal> 2   Hypoalbuminemia -9/3 Albumin  50 g   Hyperkalemia -9/3 slightly elevated we will just monitor for now, given we are going to administer 1 dose of Lasix.   Care during the described time interval was provided by me .  I have reviewed this patient's available data, including medical history, events of note, physical examination, and all test results as part of my evaluation.

## 2022-06-17 NOTE — Progress Notes (Signed)
CPT not done at this time. First attempt patient was working with PT. Came a second time and patient wanted to eat. Patient vitals stable. No distress.

## 2022-06-17 NOTE — Progress Notes (Signed)
Physical Therapy Treatment Patient Details Name: Melissa [name] MRN: [id number] DOB: May 28, 1928 Today's Date: 06/17/2022   History of Present Illness Pt is a 88 y/o female admitted for progressive SOB and generalized weakness in setting of multifocal pneumonia. PMH: CHF, CKD, DM2, a fib, dementia.    PT Comments    Pt with mild regression towards physical therapy goals; demonstrates continued decreased cardiopulmonary endurance and increased oxygen requirement. Pt on 2.5L O2 upon arrival, SpO2 95%, HR 108; pt seated on EOB and requesting to use bathroom. Pt able to complete all ADL tasks and functional mobility at a supervision-min guard assist level. Ambulating ~60 ft with a RW and one standing rest break. Desat to 80% during mobility; required bumping up to 6L O2 and an extended rest break to recover. Does not demonstrate good carryover with pursed lip breathing. Will continue to progress as tolerated.    Recommendations for follow up therapy are one component of a multi-disciplinary discharge planning process, led by the attending physician.  Recommendations may be updated based on patient status, additional functional criteria and insurance authorization.  Follow Up Recommendations  No PT follow up     Assistance Recommended at Discharge PRN  Patient can return home with the following A little help with walking and/or transfers;A little help with bathing/dressing/bathroom;Direct supervision/assist for medications management;Assist for transportation;Help with stairs or ramp for entrance   Equipment Recommendations  None recommended by PT    Recommendations for Other Services       Precautions / Restrictions Precautions Precautions: Fall;Other (comment) Precaution Comments: monitor O2 (does not wear at baseline). HR Restrictions Weight Bearing Restrictions: No     Mobility  Bed Mobility               General bed mobility comments: Sitting EOB upon arrival; requesting to  go to bathroom, but had not pressed call bell    Transfers Overall transfer level: Needs assistance Equipment used: Rolling walker (2 wheels) Transfers: Sit to/from Stand Sit to Stand: Supervision                Ambulation/Gait Ambulation/Gait assistance: Min guard, Supervision Gait Distance (Feet): 60 Feet Assistive device: Rolling walker (2 wheels) Gait Pattern/deviations: Step-through pattern, Decreased stride length Gait velocity: decreased Gait velocity interpretation: <1.8 ft/sec, indicate of risk for recurrent falls   General Gait Details: Pt requiring one standing rest break; slow and steady pace.   Stairs             Wheelchair Mobility    Modified Rankin (Stroke Patients Only)       Balance Overall balance assessment: Needs assistance, History of Falls Sitting-balance support: No upper extremity supported, Feet supported Sitting balance-Leahy Scale: Good     Standing balance support: Bilateral upper extremity supported, During functional activity Standing balance-Leahy Scale: Fair                              Cognition Arousal/Alertness: Awake/alert Behavior During Therapy: WFL for tasks assessed/performed Overall Cognitive Status: History of cognitive impairments - at baseline                                 General Comments: hx of dementia, pleasant and follows directions consistently.memory deficits        Exercises      General Comments        Pertinent Vitals/Pain  Pain Assessment Pain Assessment: No/denies pain    Home Living                          Prior Function            PT Goals (current goals can now be found in the care plan section) Acute Rehab PT Goals PT Goal Formulation: With patient Time For Goal Achievement: 06/25/22 Potential to Achieve Goals: Good Progress towards PT goals: Progressing toward goals    Frequency    Min 3X/week      PT Plan Current plan  remains appropriate    Co-evaluation              AM-PAC PT "6 Clicks" Mobility   Outcome Measure  Help needed turning from your back to your side while in a flat bed without using bedrails?: None Help needed moving from lying on your back to sitting on the side of a flat bed without using bedrails?: A Little Help needed moving to and from a bed to a chair (including a wheelchair)?: A Little Help needed standing up from a chair using your arms (e.g., wheelchair or bedside chair)?: A Little Help needed to walk in hospital room?: A Little Help needed climbing 3-5 steps with a railing? : A Little 6 Click Score: 19    End of Session Equipment Utilized During Treatment: Oxygen Activity Tolerance: Patient tolerated treatment well Patient left: with call bell/phone within reach;in chair;with chair alarm set Nurse Communication: Mobility status PT Visit Diagnosis: Difficulty in walking, not elsewhere classified (R26.2)     Time: 6468-0321 PT Time Calculation (min) (ACUTE ONLY): 24 min  Charges:  $Therapeutic Activity: 23-37 mins                     Wyona Almas, PT, DPT Acute Rehabilitation Services Office [phone]    Deno Etienne 06/17/2022, 8:39 AM

## 2022-06-17 NOTE — Progress Notes (Signed)
Occupational Therapy Treatment Patient Details Name: Melissa [name] MRN: [id number] DOB: 09-03-1928 Today's Date: 06/17/2022   History of present illness Pt is a 88 y/o female admitted for progressive SOB and generalized weakness in setting of multifocal pneumonia. PMH: CHF, CKD, DM2, a fib, dementia.   OT comments  Pt progressing towards goals, able to stand at sink for ~6-7 mins for standing grooming task with min guard A. Pt needing supervision for transfers, VSS on 2L O2, lowest SpO2 reading at 88% in standing, however pt asymptomatic. SpO2 returning to mid 90's once seated in chair at end of session. Pt presenting with impairments listed below, will follow acutely. Recommend no OT follow up at d/c pending progression.   Recommendations for follow up therapy are one component of a multi-disciplinary discharge planning process, led by the attending physician.  Recommendations may be updated based on patient status, additional functional criteria and insurance authorization.    Follow Up Recommendations  No OT follow up    Assistance Recommended at Discharge Intermittent Supervision/Assistance  Patient can return home with the following  A little help with walking and/or transfers;A little help with bathing/dressing/bathroom   Equipment Recommendations  None recommended by OT    Recommendations for Other Services      Precautions / Restrictions Precautions Precautions: Fall;Other (comment) Precaution Comments: monitor O2 (does not wear at baseline). HR Restrictions Weight Bearing Restrictions: No       Mobility Bed Mobility               General bed mobility comments: OOB inc hair upon arrival    Transfers Overall transfer level: Needs assistance Equipment used: Rolling walker (2 wheels) Transfers: Sit to/from Stand Sit to Stand: Supervision                 Balance Overall balance assessment: Needs assistance, History of Falls Sitting-balance support: No  upper extremity supported, Feet supported Sitting balance-Leahy Scale: Good     Standing balance support: Bilateral upper extremity supported, During functional activity Standing balance-Leahy Scale: Fair                             ADL either performed or assessed with clinical judgement   ADL Overall ADL's : Needs assistance/impaired     Grooming: Oral care;Standing Grooming Details (indicate cue type and reason): completed standing at sink                             Functional mobility during ADLs: Min guard;Rollator (4 wheels);Cueing for safety;Cueing for sequencing      Extremity/Trunk Assessment Upper Extremity Assessment Upper Extremity Assessment: Generalized weakness   Lower Extremity Assessment Lower Extremity Assessment: Defer to PT evaluation        Vision   Vision Assessment?: Vision impaired- to be further tested in functional context Additional Comments: hx of glaucoma   Perception Perception Perception: Not tested   Praxis Praxis Praxis: Not tested    Cognition Arousal/Alertness: Awake/alert Behavior During Therapy: WFL for tasks assessed/performed Overall Cognitive Status: History of cognitive impairments - at baseline                                 General Comments: hx of dementia, pleasant and follows directions consistently.memory deficits        Exercises  Shoulder Instructions       General Comments SpO2, down to 88% on 2L O2 with standing x5 mins, back to mid 90's once seated on 1.5L O2    Pertinent Vitals/ Pain       Pain Assessment Pain Assessment: No/denies pain  Home Living                                          Prior Functioning/Environment              Frequency  Min 2X/week        Progress Toward Goals  OT Goals(current goals can now be found in the care plan section)  Progress towards OT goals: Progressing toward goals  Acute Rehab OT  Goals Patient Stated Goal: none stated OT Goal Formulation: With patient Time For Goal Achievement: 06/25/22 Potential to Achieve Goals: Good ADL Goals Pt Will Transfer to Toilet: with modified independence;ambulating Additional ADL Goal #1: Pt to implement at least 3 energy conservation strategies during ADLs/mobility Additional ADL Goal #2: Pt to increase standing activity tolerance > 7 min without rest break during functional tasks  Plan Discharge plan remains appropriate    Co-evaluation                 AM-PAC OT "6 Clicks" Daily Activity     Outcome Measure   Help from another person eating meals?: A Little Help from another person taking care of personal grooming?: A Little Help from another person toileting, which includes using toliet, bedpan, or urinal?: A Little Help from another person bathing (including washing, rinsing, drying)?: A Little Help from another person to put on and taking off regular upper body clothing?: A Little Help from another person to put on and taking off regular lower body clothing?: A Little 6 Click Score: 18    End of Session Equipment Utilized During Treatment: Rollator (4 wheels);Oxygen  OT Visit Diagnosis: Unsteadiness on feet (R26.81)   Activity Tolerance Patient tolerated treatment well   Patient Left in chair;with call bell/phone within reach;with chair alarm set;with family/visitor present   Nurse Communication Mobility status        Time: 1110-1130 OT Time Calculation (min): 20 min  Charges: OT General Charges $OT Visit: 1 Visit OT Treatments $Self Care/Home Management : 8-22 mins  Lynnda Child, OTD, OTR/L Acute Rehab [phone]) [phone] - [place] 06/17/2022, 12:25 PM

## 2022-06-18 DIAGNOSIS — D649 Anemia, unspecified: Secondary | ICD-10-CM | POA: Diagnosis not present

## 2022-06-18 DIAGNOSIS — J9601 Acute respiratory failure with hypoxia: Secondary | ICD-10-CM | POA: Diagnosis not present

## 2022-06-18 DIAGNOSIS — J189 Pneumonia, unspecified organism: Secondary | ICD-10-CM | POA: Diagnosis not present

## 2022-06-18 DIAGNOSIS — A419 Sepsis, unspecified organism: Secondary | ICD-10-CM | POA: Diagnosis not present

## 2022-06-18 LAB — MAGNESIUM: Magnesium: 1.8 mg/dL (ref 1.7–2.4)

## 2022-06-18 LAB — COMPREHENSIVE METABOLIC PANEL
ALT: 11 U/L (ref 0–44)
AST: 11 U/L — ABNORMAL LOW (ref 15–41)
Albumin: 2.7 g/dL — ABNORMAL LOW (ref 3.5–5.0)
Alkaline Phosphatase: 63 U/L (ref 38–126)
Anion gap: 7 (ref 5–15)
BUN: 24 mg/dL — ABNORMAL HIGH (ref 8–23)
CO2: 33 mmol/L — ABNORMAL HIGH (ref 22–32)
Calcium: 9 mg/dL (ref 8.9–10.3)
Chloride: 98 mmol/L (ref 98–111)
Creatinine, Ser: 1.39 mg/dL — ABNORMAL HIGH (ref 0.44–1.00)
GFR, Estimated: 36 mL/min — ABNORMAL LOW (ref >60)
Glucose, Bld: 180 mg/dL — ABNORMAL HIGH (ref 70–99)
Potassium: 4.5 mmol/L (ref 3.5–5.1)
Sodium: 138 mmol/L (ref 135–145)
Total Bilirubin: 0.2 mg/dL — ABNORMAL LOW (ref 0.3–1.2)
Total Protein: 6.4 g/dL — ABNORMAL LOW (ref 6.5–8.1)

## 2022-06-18 LAB — GLUCOSE, CAPILLARY
Glucose-Capillary: 124 mg/dL — ABNORMAL HIGH (ref 70–99)
Glucose-Capillary: 137 mg/dL — ABNORMAL HIGH (ref 70–99)
Glucose-Capillary: 176 mg/dL — ABNORMAL HIGH (ref 70–99)
Glucose-Capillary: 237 mg/dL — ABNORMAL HIGH (ref 70–99)
Glucose-Capillary: 244 mg/dL — ABNORMAL HIGH (ref 70–99)
Glucose-Capillary: 268 mg/dL — ABNORMAL HIGH (ref 70–99)

## 2022-06-18 LAB — CBC WITH DIFFERENTIAL/PLATELET
Abs Immature Granulocytes: 0.36 10*3/uL — ABNORMAL HIGH (ref 0.00–0.07)
Basophils Absolute: 0 10*3/uL (ref 0.0–0.1)
Basophils Relative: 0 %
Eosinophils Absolute: 0.1 10*3/uL (ref 0.0–0.5)
Eosinophils Relative: 1 %
HCT: 31.9 % — ABNORMAL LOW (ref 36.0–46.0)
Hemoglobin: 10.1 g/dL — ABNORMAL LOW (ref 12.0–15.0)
Immature Granulocytes: 3 %
Lymphocytes Relative: 15 %
Lymphs Abs: 1.9 10*3/uL (ref 0.7–4.0)
MCH: 26 pg (ref 26.0–34.0)
MCHC: 31.7 g/dL (ref 30.0–36.0)
MCV: 82.2 fL (ref 80.0–100.0)
Monocytes Absolute: 1 10*3/uL (ref 0.1–1.0)
Monocytes Relative: 8 %
Neutro Abs: 9.1 10*3/uL — ABNORMAL HIGH (ref 1.7–7.7)
Neutrophils Relative %: 73 %
Platelets: 399 10*3/uL (ref 150–400)
RBC: 3.88 MIL/uL (ref 3.87–5.11)
RDW: 14.4 % (ref 11.5–15.5)
WBC: 12.5 10*3/uL — ABNORMAL HIGH (ref 4.0–10.5)
nRBC: 0 % (ref 0.0–0.2)

## 2022-06-18 LAB — PROCALCITONIN: Procalcitonin: <0.1 ng/mL

## 2022-06-18 LAB — PHOSPHORUS: Phosphorus: 3.1 mg/dL (ref 2.5–4.6)

## 2022-06-18 NOTE — Progress Notes (Signed)
Mobility Specialist Progress Note   06/18/22 1213  Mobility  Activity Ambulated with assistance in hallway  Level of Assistance Standby assist, set-up cues, supervision of patient - no hands on  Assistive Device Front wheel walker  Distance Ambulated (ft) 84 ft  Activity Response Tolerated well  $Mobility charge 1 Mobility   Pre Mobility: 101 HR, 121/69 BP, 94% SpO2 During Mobility: 112 HR, 80% SpO2 on 4LO2 Post Mobility: 97 HR, 127/66 BP, 95% SpO2 on 2LO2  Session limited today by desaturation. Pt having no complaints prior to mobility and requiring no physical assistance. When ambulating, pt desat. To 80% SpO2 on 2LO2 and requiring 4LO2(per advisement of RN) plus a standing rest break to remain  at 88% SpO2. Returned back to room d/t fatigue and SOB. Left in chair w/ pt back on 2LO2 floating around 94%, call bell placed in reach and chair alarm on.  Holland Falling Mobility Specialist [place] #:  [phone] Acute Rehab Office:  [phone]

## 2022-06-18 NOTE — Progress Notes (Signed)
Melissa [name] IOX:[id number] DOB: [birth year] DOA: 06/09/2022 PCP: Darreld Mclean, MD   Subj: 88 year old WF PMHx chronic systolic CHF, stage IV CKD baseline creatinine 1.7, DM Type 2, paroxysmal A-fib on Eliquis, Dementia   comes to the hospital with concerns of multifocal pneumonia and generalized weakness.  Patient states started feeling weak about a week ago.  ED chest x-ray showed multifocal pneumonia, she was started on IV Rocephin and azithromycin.  BNP was negative.  COVID-19/flu-negative due to concerns of aspiration she was seen by speech therapist and eventually cleared for regular diet.   Obj: 9/5 afebrile overnight A/O x4, positive DOE, negative SOB when sitting in chair.  States feels as if respiratory status has started to improve.  States feels as if Physiotherapy vest is helping    Objective: VITAL SIGNS: Temp: 98 F (36.7 C) (09/05 0821) Temp Source: Oral (09/05 0821) BP: 139/73 (09/05 0844) Pulse Rate: 98 (09/05 0844) SPO2; 95% FIO2: 2 L O2 via [place]   Intake/Output Summary (Last 24 hours) at 06/18/2022 1146 Last data filed at 06/17/2022 2100 Gross per 24 hour  Intake 440 ml  Output 300 ml  Net 140 ml       Exam: General: A/O x4, positive acute respiratory distress Lungs: clear to auscultation bilaterally, negative wheezes, mild diffuse crackles Cardiovascular: Regular rate and rhythm without murmur gallop or rub normal S1 and S2 Abdomen: Nontender, nondistended, soft, bowel sounds positive, no rebound, no ascites, no appreciable mass Extremities: No significant cyanosis, clubbing, or edema bilateral lower extremities Skin: Negative rashes, lesions, ulcers Psychiatric:  Negative depression, negative anxiety, negative fatigue, negative mania  Central nervous system:  Cranial nerves II through XII intact, tongue/uvula midline, all extremities muscle strength 5/5, sensation intact throughout, negative dysarthria, negative expressive aphasia, negative receptive  aphasia.  .   Mobility Assessment (last 72 hours)     Mobility Assessment     Row Name 06/18/22 0830 06/17/22 1200 06/17/22 0837 06/16/22 0910 06/15/22 1254   Does patient have an order for bedrest or is patient medically unstable No - Continue assessment -- No - Continue assessment No - Continue assessment No - Continue assessment   What is the highest level of mobility based on the progressive mobility assessment? Level 5 (Walks with assist in room/hall) - Balance while stepping forward/back and can walk in room with assist - Complete Level 5 (Walks with assist in room/hall) - Balance while stepping forward/back and can walk in room with assist - Complete Level 5 (Walks with assist in room/hall) - Balance while stepping forward/back and can walk in room with assist - Complete Level 5 (Walks with assist in room/hall) - Balance while stepping forward/back and can walk in room with assist - Complete Level 5 (Walks with assist in room/hall) - Balance while stepping forward/back and can walk in room with assist - Complete              DVT prophylaxis: Eliquis Code Status: Full Family Communication: 9/5 daughter at bedside for discussion plan of care all questions answered Status is: Inpatient    Dispo: The patient is from: Home              Anticipated d/c is to: Home              Anticipated d/c date is: > 3 days              Patient currently is not medically stable to d/c.  Procedures/Significant Events:  8/30 Esophagram -  No aspiration observed.  -Esophagus demonstrates normal appearance. -Primary peristalsis is poor with abundant abnormal tertiary contractions observed consistent with presbyesophagus.  -Stasis of barium within the esophagus observed over the course of several minutes.  -Small hiatal hernia.  -No Reflux observed. 13 mm barium tablet passed into the small hiatal hernia. No fixed esophageal stricture. 9/3 PCXR -Bilateral airspace lung opacities have increased  compared to the most recent prior study consistent with worsened multifocal pneumonia  Consultants:     Cultures 8/27 SARS coronavirus negative 8/27 influenza A/B  negative 8/28 positive parainfluenza   Antimicrobials: Anti-infectives (From admission, onward)    Start     Ordered Stop   06/16/22 1830  ceFEPIme (MAXIPIME) 1 g in sodium chloride 0.9 % 100 mL IVPB        06/16/22 1742     06/09/22 1915  cefTRIAXone (ROCEPHIN) 2 g in sodium chloride 0.9 % 100 mL IVPB  Status:  Discontinued        06/09/22 1913 06/11/22 0806   06/09/22 1915  azithromycin (ZITHROMAX) 500 mg in sodium chloride 0.9 % 250 mL IVPB  Status:  Discontinued        06/09/22 1913 06/11/22 0806            A/P  positive parainfluenza pneumonia -Solu-Medrol 60 mg daily - Incentive spirometry - Flutter valve - 9/2 DC DuoNeb QID - Mucinex DM BID -9/2 Xopenex nebulizer TID.  Titrate down as tolerated given patient's episodes of MAT - Continuous pulse ox - Titrate O2 to maintain SPO2> 93% -9/3 PCXR: Consistent with possible bacterial superinfection  -9/5 patient still have significant desaturation.  Too much in fact to even consider CIR or SNF placement at this time.  See mobility note from 9/5   Acute respiratory failure with hypoxia/multifocal pneumonia -See parainfluenza pneumonia    HCAP - 9/3 cefepime x7 - 9/3 procalcitonin pending - Physiotherapy vest BID starting in the A.m. - See parainfluenza pneumonia    Nausea and Vomiting -Esophogram: Poor primary peristalsis   Chronic Systolic CHF  - Echo July 2022 showed EF of 20%.   -9/2 Metoprolol 12.5 mg BID.  PER daughter patient sensitive to BB -9/2 see pneumonia -Strict ins and out +3.4 L - Daily weight Filed Weights   06/12/22 0500 06/13/22 0450 06/14/22 0500  Weight: 66 kg 65 kg 66 kg  -9/2 attempt to maintain patient euvolemic -9/4 Lasix IV 40 mg x 1   Paroxysmal A-fib -See CHF - On Eliquis   CKD stage IV(Baseline creatinine  1.3.) Lab Results  Component Value Date   CREATININE 1.39 (H) 06/18/2022   CREATININE 1.33 (H) 06/17/2022   CREATININE 1.32 (H) 06/16/2022   CREATININE 1.43 (H) 06/15/2022   CREATININE 1.42 (H) 06/14/2022  -Baseline   Diabetes mellitus type 2 uncontrolled with Hyperglycemia, insulin-dependent -8/27 hemoglobin A1c= 8.5 - Levemir 12 units daily - Sensitive SSI CBG (last 3)  Recent Labs    06/18/22 0032 06/18/22 0608 06/18/22 0823  GLUCAP 176* 124* 137*        Restless leg syndrome - On Mirapex   Anemia of chronic disease - Baseline hemoglobin 11.   -Transfuse for hemoglobin<7 Lab Results  Component Value Date   HGB 10.1 (L) 06/18/2022   HGB 8.9 (L) 06/17/2022   HGB 9.0 (L) 06/16/2022   HGB 9.2 (L) 06/15/2022   HGB 9.6 (L) 06/14/2022  -Stable  Refractory HA - Fioricet PRN -9/2 resolved  Hypomagnesemia -Magnesium goal> 2   Hypoalbuminemia -9/3 Albumin 50  g   Hyperkalemia -9/3 slightly elevated we will just monitor for now, given we are going to administer 1 dose of Lasix.   Care during the described time interval was provided by me .  I have reviewed this patient's available data, including medical history, events of note, physical examination, and all test results as part of my evaluation.

## 2022-06-18 NOTE — Plan of Care (Signed)

## 2022-06-19 DIAGNOSIS — J189 Pneumonia, unspecified organism: Secondary | ICD-10-CM | POA: Diagnosis not present

## 2022-06-19 LAB — CBC WITH DIFFERENTIAL/PLATELET
Abs Immature Granulocytes: 0.3 10*3/uL — ABNORMAL HIGH (ref 0.00–0.07)
Basophils Absolute: 0 10*3/uL (ref 0.0–0.1)
Basophils Relative: 0 %
Eosinophils Absolute: 0.1 10*3/uL (ref 0.0–0.5)
Eosinophils Relative: 1 %
HCT: 30.1 % — ABNORMAL LOW (ref 36.0–46.0)
Hemoglobin: 9.4 g/dL — ABNORMAL LOW (ref 12.0–15.0)
Immature Granulocytes: 2 %
Lymphocytes Relative: 15 %
Lymphs Abs: 1.9 10*3/uL (ref 0.7–4.0)
MCH: 25.6 pg — ABNORMAL LOW (ref 26.0–34.0)
MCHC: 31.2 g/dL (ref 30.0–36.0)
MCV: 82 fL (ref 80.0–100.0)
Monocytes Absolute: 1 10*3/uL (ref 0.1–1.0)
Monocytes Relative: 8 %
Neutro Abs: 9.6 10*3/uL — ABNORMAL HIGH (ref 1.7–7.7)
Neutrophils Relative %: 74 %
Platelets: 365 10*3/uL (ref 150–400)
RBC: 3.67 MIL/uL — ABNORMAL LOW (ref 3.87–5.11)
RDW: 14.4 % (ref 11.5–15.5)
WBC: 12.9 10*3/uL — ABNORMAL HIGH (ref 4.0–10.5)
nRBC: 0 % (ref 0.0–0.2)

## 2022-06-19 LAB — COMPREHENSIVE METABOLIC PANEL
ALT: 11 U/L (ref 0–44)
AST: 11 U/L — ABNORMAL LOW (ref 15–41)
Albumin: 2.2 g/dL — ABNORMAL LOW (ref 3.5–5.0)
Alkaline Phosphatase: 53 U/L (ref 38–126)
Anion gap: 10 (ref 5–15)
BUN: 22 mg/dL (ref 8–23)
CO2: 29 mmol/L (ref 22–32)
Calcium: 8.4 mg/dL — ABNORMAL LOW (ref 8.9–10.3)
Chloride: 97 mmol/L — ABNORMAL LOW (ref 98–111)
Creatinine, Ser: 1.27 mg/dL — ABNORMAL HIGH (ref 0.44–1.00)
GFR, Estimated: 40 mL/min — ABNORMAL LOW (ref >60)
Glucose, Bld: 177 mg/dL — ABNORMAL HIGH (ref 70–99)
Potassium: 4.1 mmol/L (ref 3.5–5.1)
Sodium: 136 mmol/L (ref 135–145)
Total Bilirubin: 0.3 mg/dL (ref 0.3–1.2)
Total Protein: 5.5 g/dL — ABNORMAL LOW (ref 6.5–8.1)

## 2022-06-19 LAB — PHOSPHORUS: Phosphorus: 3 mg/dL (ref 2.5–4.6)

## 2022-06-19 LAB — GLUCOSE, CAPILLARY
Glucose-Capillary: 149 mg/dL — ABNORMAL HIGH (ref 70–99)
Glucose-Capillary: 213 mg/dL — ABNORMAL HIGH (ref 70–99)
Glucose-Capillary: 227 mg/dL — ABNORMAL HIGH (ref 70–99)
Glucose-Capillary: 286 mg/dL — ABNORMAL HIGH (ref 70–99)

## 2022-06-19 LAB — MAGNESIUM: Magnesium: 1.6 mg/dL — ABNORMAL LOW (ref 1.7–2.4)

## 2022-06-19 MED ORDER — MAGNESIUM SULFATE 2 GM/50ML IV SOLN
2.0000 g | Freq: Once | INTRAVENOUS | Status: AC
  Administered 2022-06-19: 2 g via INTRAVENOUS
  Filled 2022-06-19: qty 50

## 2022-06-19 NOTE — Plan of Care (Signed)

## 2022-06-19 NOTE — Progress Notes (Signed)
PROGRESS NOTE  Melissa [name]  DOB: Mar 08, 1928  PCP: Darreld Mclean, MD FTD:[id number]  DOA: 06/09/2022  LOS: 10 days  Hospital Day: 11  Brief narrative: Melissa [name] is a 88 y.o. female with PMH significant for dementia, DM2, HTN, HLD, paroxysmal A-fib on Eliquis, CHF, nonischemic cardiomyopathy, CKD, GI bleed, chronic anemia, restless legs, hypothyroidism, essential tremors 8/27, patient was brought to the hospital with generalized weakness for a week X-ray showed multifocal pneumonia Respiratory virus panel was positive for parainfluenza.  Patient was also treated with empiric IV Rocephin and azithromycin for 3 days.  However patient's respiratory status did not improve. Repeat chest x-ray 9/3 showed worsening of infiltrates.  Patient was started on IV cefepime See below for details.  Subjective: Patient was seen and examined this afternoon.  Pleasant elderly Caucasian female.  She was walking on the hallway with PT.  Required 4 L oxygen by nasal cannula with oxygen saturation in high 80s.  She was not feeling respiratory distress however.  Later seen in her room again.  Feeling better than yesterday.  Son at bedside. Chart reviewed No fever in last 24 hours.  Heart rate in 90s and 100s.  Blood pressure mostly normal.  Breathing on 2 L oxygen by nasal cannula. Last set of labs from this morning showed BUN/creatinine at 22/1.27, magnesium low at 1.6, WBC count slightly elevated at 12.9, hemoglobin low at 9.4  Assessment and plan: Multifocal pneumonia Acute respiratory failure with hypoxia Initially considered pneumonia due to parainfluenza.  Was started on supportive care as well as short course of IV antibiotics without improvement. Repeat chest x-ray 9/3 showed worsening of infiltrates. Antibiotic coverage was broadened to IV cefepime.  Procalcitonin level however remains normal. Currently also on Solu-Medrol IV 60 mg daily, Mucinex, DuoNeb, Xopenex Currently on 2 L oxygen by  nasal cannula.  Wean down as tolerated Recent Labs  Lab 06/15/22 0055 06/16/22 0046 06/17/22 0047 06/18/22 0117 06/19/22 0143  WBC 12.2* 9.2 10.4 12.5* 12.9*  PROCALCITON  --  <0.10 <0.10 <0.10  --    Chronic systolic CHF Essential hypertension Euvolemic at this time.  Blood pressure mostly normal range. Currently on metoprolol 12.5 mg twice daily and Lasix as needed  Paroxysmal A-fib Currently on metoprolol Anticoagulated with Eliquis  CKD 4 Baseline creatinine 1.3 Currently creatinine at baseline. Recent Labs    06/10/22 0408 06/11/22 0135 06/12/22 0437 06/13/22 0136 06/14/22 0259 06/15/22 0055 06/16/22 0046 06/17/22 0047 06/18/22 0117 06/19/22 0143  BUN 16 9 7* '10 14 15 17 19 '$ 24* 22  CREATININE 1.51* 1.20* 1.21* 1.29* 1.42* 1.43* 1.32* 1.33* 1.39* 1.27*   Uncontrolled type 2 diabetes mellitus A1c 8.5 on 06/09/2022 Currently on Levemir 12 units daily along with sliding scale insulin.  Blood sugar level is running mostly over 200 probably because of steroids. Continue to monitor and adjust insulin. Recent Labs  Lab 06/18/22 1606 06/18/22 2014 06/19/22 0757 06/19/22 1120 06/19/22 1546  GLUCAP 268* 244* 149* 213* 227*      Anemia of chronic disease Baseline hemoglobin 11.  Currently hemoglobin running lower than average.  No active bleeding. Recent Labs    08/15/21 1459 12/06/21 1222 12/20/21 1703 06/09/22 1543 06/10/22 0408 06/15/22 0055 06/16/22 0046 06/17/22 0047 06/18/22 0117 06/19/22 0143  HGB  --  13.0   < > 11.0*   < > 9.2* 9.0* 8.9* 10.1* 9.4*  MCV  --  86.3   < > 82.6   < > 80.2 81.1 82.9 82.2 82.0  VITAMINB12 153* [id number]*  --   --   --   --   --   --   --   --   FERRITIN  --   --   --  337*  --   --   --   --   --   --   RETICCTPCT  --   --   --  1.6  --   --   --   --   --   --    < > = values in this interval not displayed.     Hyperkalemia/hypomagnesemia Plan this morning with potassium normal.  Magnesium low at 1.6.  Magnesium  replacement IV given. Recent Labs  Lab 06/15/22 0055 06/16/22 0046 06/17/22 0047 06/18/22 0117 06/19/22 0143  K 4.6 4.7 5.3* 4.5 4.1  MG 1.8 1.9 1.8 1.8 1.6*  PHOS 3.1 3.3 3.6 3.1 3.0   Nausea and Vomiting Esophogram noted presbyesophagus.  No fixed stricture.   Restless leg syndrome  On Mirapex   Goals of care   Code Status: Full Code    Mobility: Encourage ambulation.  PT eval obtained.  Skin assessment:     Nutritional status:  Body mass index is 24.34 kg/m.          Diet:  Diet Order             Diet regular Room service appropriate? Yes with Assist; Fluid consistency: Thin  Diet effective now                   DVT prophylaxis:  apixaban (ELIQUIS) tablet 2.5 mg Start: 06/09/22 2200 SCDs Start: 06/09/22 1946 apixaban (ELIQUIS) tablet 2.5 mg   Antimicrobials: IV cefepime Fluid: None Consultants: None Family Communication: Son at bedside  Status is: Inpatient  Continue in-hospital care because: To monitor respiratory status for next 24 to 48 hours Level of care: Med-Surg   Dispo: The patient is from: Home              Anticipated d/c is to: Hopefully home in 1 to 2 days              Patient currently is not medically stable to d/c.   Difficult to place patient No     Infusions:   ceFEPime (MAXIPIME) IV 1 g (06/18/22 1854)   magnesium sulfate bolus IVPB 2 g (06/19/22 1625)    Scheduled Meds:  apixaban  2.5 mg Oral BID   dextromethorphan-guaiFENesin  1 tablet Oral BID   FLUoxetine  20 mg Oral Q breakfast   insulin aspart  0-9 Units Subcutaneous TID WC   insulin detemir  12 Units Subcutaneous QHS   levalbuterol  1.25 mg Nebulization BID   levothyroxine  50 mcg Oral Q0600   loratadine  10 mg Oral Daily   methylPREDNISolone (SOLU-MEDROL) injection  60 mg Intravenous Daily   metoprolol tartrate  12.5 mg Oral BID   pantoprazole  40 mg Oral BID WC   pramipexole  0.5 mg Oral QPM   QUEtiapine  100 mg Oral QHS   traZODone  100 mg Oral  QHS    PRN meds: acetaminophen **OR** acetaminophen, butalbital-acetaminophen-caffeine, hydrALAZINE, levalbuterol, metoprolol tartrate, ondansetron (ZOFRAN) IV, senna-docusate   Antimicrobials: Anti-infectives (From admission, onward)    Start     Dose/Rate Route Frequency Ordered Stop   06/16/22 1830  ceFEPIme (MAXIPIME) 1 g in sodium chloride 0.9 % 100 mL IVPB        1 g 200  mL/hr over 30 Minutes Intravenous Every 24 hours 06/16/22 1742     06/09/22 1915  cefTRIAXone (ROCEPHIN) 2 g in sodium chloride 0.9 % 100 mL IVPB  Status:  Discontinued        2 g 200 mL/hr over 30 Minutes Intravenous Every 24 hours 06/09/22 1913 06/11/22 0806   06/09/22 1915  azithromycin (ZITHROMAX) 500 mg in sodium chloride 0.9 % 250 mL IVPB  Status:  Discontinued        500 mg 250 mL/hr over 60 Minutes Intravenous Every 24 hours 06/09/22 1913 06/11/22 0806       Objective: Vitals:   06/19/22 0914 06/19/22 1423  BP: (!) 144/82 123/81  Pulse: (!) 102 99  Resp:  18  Temp:  98.1 F (36.7 C)  SpO2:  96%    Intake/Output Summary (Last 24 hours) at 06/19/2022 1651 Last data filed at 06/19/2022 1059 Gross per 24 hour  Intake 240 ml  Output --  Net 240 ml   Filed Weights   06/13/22 0450 06/14/22 0500 06/19/22 0518  Weight: 65 kg 66 kg 64.3 kg   Weight change:  Body mass index is 24.34 kg/m.   Physical Exam: General exam: Pleasant, elderly Caucasian female.  Not in physical distress at this time Skin: No rashes, lesions or ulcers. HEENT: Atraumatic, normocephalic, no obvious bleeding Lungs: Diminished air entry in both bases.  No cough on deep breathing today. CVS: Regular rate and rhythm, no murmur GI/Abd soft, nontender, nondistended, bowel sound present CNS: Alert, awake, oriented x3 Psychiatry: Mood appropriate Extremities: No pedal edema, no calf tenderness  Data Review: I have personally reviewed the laboratory data and studies available.  F/u labs ordered Unresulted Labs (From  admission, onward)     Start     Ordered   06/17/22 0500  CBC with Differential/Platelet  Daily at 5am,   R     Question:  Specimen collection method  Answer:  Lab=Lab collect   06/16/22 1742   06/17/22 0500  Comprehensive metabolic panel  Daily at 5am,   R     Question:  Specimen collection method  Answer:  Lab=Lab collect   06/16/22 1742   06/17/22 0500  Magnesium  Daily at 5am,   R     Question:  Specimen collection method  Answer:  Lab=Lab collect   06/16/22 1742   06/17/22 0500  Phosphorus  Daily at 5am,   R     Question:  Specimen collection method  Answer:  Lab=Lab collect   06/16/22 1742   06/16/22 1747  Expectorated Sputum Assessment w Gram Stain, Rflx to [place],   URGENT        06/16/22 1746            Signed, Terrilee Croak, MD [facility] 06/19/2022

## 2022-06-19 NOTE — Plan of Care (Addendum)
Large BM this shift PRN Metoprolol afterwards for sustained HR 110's - 120's   Problem: Education: Goal: Ability to describe self-care measures that may prevent or decrease complications (Diabetes Survival Skills Education) will improve Outcome: Progressing Goal: Individualized Educational Video(s) Outcome: Progressing   Problem: Coping: Goal: Ability to adjust to condition or change in health will improve Outcome: Progressing   Problem: Fluid Volume: Goal: Ability to maintain a balanced intake and output will improve Outcome: Progressing   Problem: Health Behavior/Discharge Planning: Goal: Ability to identify and utilize available resources and services will improve Outcome: Progressing Goal: Ability to manage health-related needs will improve Outcome: Progressing   Problem: Metabolic: Goal: Ability to maintain appropriate glucose levels will improve Outcome: Progressing   Problem: Nutritional: Goal: Maintenance of adequate nutrition will improve Outcome: Progressing Goal: Progress toward achieving an optimal weight will improve Outcome: Progressing   Problem: Skin Integrity: Goal: Risk for impaired skin integrity will decrease Outcome: Progressing   Problem: Tissue Perfusion: Goal: Adequacy of tissue perfusion will improve Outcome: Progressing   Problem: Education: Goal: Knowledge of General Education information will improve Description: Including pain rating scale, medication(s)/side effects and non-pharmacologic comfort measures Outcome: Progressing   Problem: Health Behavior/Discharge Planning: Goal: Ability to manage health-related needs will improve Outcome: Progressing   Problem: Clinical Measurements: Goal: Ability to maintain clinical measurements within normal limits will improve Outcome: Progressing Goal: Will remain free from infection Outcome: Progressing Goal: Diagnostic test results will improve Outcome: Progressing Goal: Respiratory  complications will improve Outcome: Progressing Goal: Cardiovascular complication will be avoided Outcome: Progressing   Problem: Activity: Goal: Risk for activity intolerance will decrease Outcome: Progressing   Problem: Nutrition: Goal: Adequate nutrition will be maintained Outcome: Progressing   Problem: Coping: Goal: Level of anxiety will decrease Outcome: Progressing   Problem: Elimination: Goal: Will not experience complications related to bowel motility Outcome: Progressing Goal: Will not experience complications related to urinary retention Outcome: Progressing   Problem: Pain Managment: Goal: General experience of comfort will improve Outcome: Progressing   Problem: Safety: Goal: Ability to remain free from injury will improve Outcome: Progressing   Problem: Skin Integrity: Goal: Risk for impaired skin integrity will decrease Outcome: Progressing

## 2022-06-19 NOTE — Progress Notes (Signed)
Occupational Therapy Treatment Patient Details Name: Melissa [name] MRN: [id number] DOB: 09/24/1928 Today's Date: 06/19/2022   History of present illness Pt is a 88 y/o female admitted for progressive SOB and generalized weakness in setting of multifocal pneumonia. PMH: CHF, CKD, DM2, a fib, dementia.   OT comments  Pt is making great progress towards her OT goals. Overall she requires supervision -min A for all functional mobility with RW, assist for cues and safety. Pt's SpO2 dropped to 88% on 2L, and recovered quickly on 4L. Pt declined ADL participation this session, per pt and pt's son pt completed bathing, toileting and grooming with minimal assist prior to session. Pt demonstrated good use of incentive spirometer at the end of the session with cues. OT to continue to follow. POC remains appropriate.    Recommendations for follow up therapy are one component of a multi-disciplinary discharge planning process, led by the attending physician.  Recommendations may be updated based on patient status, additional functional criteria and insurance authorization.    Follow Up Recommendations  No OT follow up    Assistance Recommended at Discharge Intermittent Supervision/Assistance  Patient can return home with the following  A little help with walking and/or transfers;A little help with bathing/dressing/bathroom   Equipment Recommendations  None recommended by OT       Precautions / Restrictions Precautions Precautions: Fall;Other (comment) Precaution Comments: watch O2 Restrictions Weight Bearing Restrictions: No       Mobility Bed Mobility Overal bed mobility: Modified Independent             General bed mobility comments: no assist needed to get in/out of bed    Transfers Overall transfer level: Needs assistance Equipment used: Rolling walker (2 wheels) Transfers: Sit to/from Stand Sit to Stand: Supervision                 Balance Overall balance assessment:  Needs assistance, History of Falls Sitting-balance support: No upper extremity supported, Feet supported Sitting balance-Leahy Scale: Good     Standing balance support: Bilateral upper extremity supported, During functional activity Standing balance-Leahy Scale: Fair Standing balance comment: able to statically stand at the bed side without UE support                           ADL either performed or assessed with clinical judgement   ADL Overall ADL's : Needs assistance/impaired                                     Functional mobility during ADLs: Supervision/safety;Rolling walker (2 wheels) General ADL Comments: Session focused on activity tolerance, pt bathed, groomed and toileted prior to session    Extremity/Trunk Assessment Upper Extremity Assessment Upper Extremity Assessment: Generalized weakness   Lower Extremity Assessment Lower Extremity Assessment: Defer to PT evaluation        Vision   Vision Assessment?: Vision impaired- to be further tested in functional context Additional Comments: hx of glaucoma   Perception Perception Perception: Not tested   Praxis Praxis Praxis: Not tested    Cognition Arousal/Alertness: Awake/alert Behavior During Therapy: WFL for tasks assessed/performed Overall Cognitive Status: History of cognitive impairments - at baseline                                 General Comments: hx  of dementia, pleasant and follows directions consistently, memory deficits        Exercises      Shoulder Instructions       General Comments SpO2 down to 88% on 2L with mobility, increased to 4L with quick recovery    Pertinent Vitals/ Pain       Pain Assessment Pain Assessment: No/denies pain  Home Living                                          Prior Functioning/Environment              Frequency  Min 2X/week        Progress Toward Goals  OT Goals(current goals can  now be found in the care plan section)  Progress towards OT goals: Progressing toward goals  Acute Rehab OT Goals Patient Stated Goal: did not state OT Goal Formulation: With patient Time For Goal Achievement: 06/25/22 Potential to Achieve Goals: Good ADL Goals Pt Will Transfer to Toilet: with modified independence;ambulating Additional ADL Goal #1: Pt to implement at least 3 energy conservation strategies during ADLs/mobility Additional ADL Goal #2: Pt to increase standing activity tolerance > 7 min without rest break during functional tasks  Plan Discharge plan remains appropriate    Co-evaluation                 AM-PAC OT "6 Clicks" Daily Activity     Outcome Measure   Help from another person eating meals?: A Little Help from another person taking care of personal grooming?: A Little Help from another person toileting, which includes using toliet, bedpan, or urinal?: A Little Help from another person bathing (including washing, rinsing, drying)?: A Little Help from another person to put on and taking off regular upper body clothing?: A Little Help from another person to put on and taking off regular lower body clothing?: A Little 6 Click Score: 18    End of Session Equipment Utilized During Treatment: Rolling walker (2 wheels)  OT Visit Diagnosis: Unsteadiness on feet (R26.81)   Activity Tolerance Patient tolerated treatment well   Patient Left in bed;with call bell/phone within reach;with bed alarm set;with family/visitor present   Nurse Communication Mobility status        Time: 4158-3094 OT Time Calculation (min): 17 min  Charges: OT General Charges $OT Visit: 1 Visit OT Treatments $Therapeutic Activity: 8-22 mins    Famous Eisenhardt A Derinda Bartus 06/19/2022, 4:25 PM

## 2022-06-19 NOTE — Progress Notes (Signed)
Physical Therapy Treatment Patient Details Name: Melissa [name] MRN: [id number] DOB: 11-22-1927 Today's Date: 06/19/2022   History of Present Illness Pt is a 88 y/o female admitted for progressive SOB and generalized weakness in setting of multifocal pneumonia. PMH: CHF, CKD, DM2, a fib, dementia.    PT Comments    Pt progressing well towards her physical therapy goals; exhibits improved activity tolerance and ambulation distance this date. Pt able to perform seated warm up exercises and ambulating 100", 40" with seated rest break in between bouts. SpO2 94% on 4L O2. Will continue to follow acutely.  SATURATION QUALIFICATIONS: (This note is used to comply with regulatory documentation for home oxygen)  Patient Saturations on Room Air at Rest = 87%  Patient Saturations on Room Air while Ambulating = N/A  Patient Saturations on 4 Liters of oxygen while Ambulating = 94%  Please briefly explain why patient needs home oxygen: To maintain oxygen saturations > 90% at rest and with ambulation    Recommendations for follow up therapy are one component of a multi-disciplinary discharge planning process, led by the attending physician.  Recommendations may be updated based on patient status, additional functional criteria and insurance authorization.  Follow Up Recommendations  No PT follow up     Assistance Recommended at Discharge PRN  Patient can return home with the following A little help with walking and/or transfers;A little help with bathing/dressing/bathroom;Direct supervision/assist for medications management;Assist for transportation;Help with stairs or ramp for entrance   Equipment Recommendations  None recommended by PT    Recommendations for Other Services       Precautions / Restrictions Precautions Precautions: Fall;Other (comment) Precaution Comments: watch O2 Restrictions Weight Bearing Restrictions: No     Mobility  Bed Mobility Overal bed mobility: Modified  Independent                  Transfers Overall transfer level: Needs assistance Equipment used: Rolling walker (2 wheels) Transfers: Sit to/from Stand Sit to Stand: Supervision                Ambulation/Gait Ambulation/Gait assistance: Min guard Gait Distance (Feet): 140 Feet (100 ft, 40 ft) Assistive device: Rolling walker (2 wheels) Gait Pattern/deviations: Step-through pattern, Decreased stride length Gait velocity: decreased     General Gait Details: Pt requiring one seated rest break; slow and steady pace   Chief Strategy Officer    Modified Rankin (Stroke Patients Only)       Balance Overall balance assessment: Needs assistance, History of Falls Sitting-balance support: No upper extremity supported, Feet supported Sitting balance-Leahy Scale: Good     Standing balance support: Bilateral upper extremity supported, During functional activity Standing balance-Leahy Scale: Fair                              Cognition Arousal/Alertness: Awake/alert Behavior During Therapy: WFL for tasks assessed/performed Overall Cognitive Status: History of cognitive impairments - at baseline                                 General Comments: hx of dementia, pleasant and follows directions consistently.memory deficits        Exercises      General Comments        Pertinent Vitals/Pain Pain Assessment Pain Assessment: No/denies pain  Home Living                          Prior Function            PT Goals (current goals can now be found in the care plan section) Acute Rehab PT Goals Patient Stated Goal: did not state Potential to Achieve Goals: Good Progress towards PT goals: Progressing toward goals    Frequency    Min 3X/week      PT Plan Current plan remains appropriate    Co-evaluation              AM-PAC PT "6 Clicks" Mobility   Outcome Measure  Help needed turning  from your back to your side while in a flat bed without using bedrails?: None Help needed moving from lying on your back to sitting on the side of a flat bed without using bedrails?: None Help needed moving to and from a bed to a chair (including a wheelchair)?: A Little Help needed standing up from a chair using your arms (e.g., wheelchair or bedside chair)?: A Little Help needed to walk in hospital room?: A Little Help needed climbing 3-5 steps with a railing? : A Little 6 Click Score: 20    End of Session Equipment Utilized During Treatment: Oxygen Activity Tolerance: Patient tolerated treatment well Patient left: in chair;with call bell/phone within reach;with chair alarm set;with family/visitor present Nurse Communication: Mobility status PT Visit Diagnosis: Difficulty in walking, not elsewhere classified (R26.2)     Time: 9450-3888 PT Time Calculation (min) (ACUTE ONLY): 19 min  Charges:  $Therapeutic Activity: 8-22 mins                     Wyona Almas, PT, DPT Acute Rehabilitation Services Office [phone]   Deno Etienne 06/19/2022, 2:30 PM

## 2022-06-20 DIAGNOSIS — J189 Pneumonia, unspecified organism: Secondary | ICD-10-CM | POA: Diagnosis not present

## 2022-06-20 LAB — CBC WITH DIFFERENTIAL/PLATELET
Abs Immature Granulocytes: 0.39 10*3/uL — ABNORMAL HIGH (ref 0.00–0.07)
Basophils Absolute: 0 10*3/uL (ref 0.0–0.1)
Basophils Relative: 0 %
Eosinophils Absolute: 0.1 10*3/uL (ref 0.0–0.5)
Eosinophils Relative: 0 %
HCT: 30.5 % — ABNORMAL LOW (ref 36.0–46.0)
Hemoglobin: 9.7 g/dL — ABNORMAL LOW (ref 12.0–15.0)
Immature Granulocytes: 3 %
Lymphocytes Relative: 14 %
Lymphs Abs: 1.9 10*3/uL (ref 0.7–4.0)
MCH: 26.1 pg (ref 26.0–34.0)
MCHC: 31.8 g/dL (ref 30.0–36.0)
MCV: 82.2 fL (ref 80.0–100.0)
Monocytes Absolute: 1 10*3/uL (ref 0.1–1.0)
Monocytes Relative: 8 %
Neutro Abs: 10.3 10*3/uL — ABNORMAL HIGH (ref 1.7–7.7)
Neutrophils Relative %: 75 %
Platelets: 359 10*3/uL (ref 150–400)
RBC: 3.71 MIL/uL — ABNORMAL LOW (ref 3.87–5.11)
RDW: 14.5 % (ref 11.5–15.5)
WBC: 13.7 10*3/uL — ABNORMAL HIGH (ref 4.0–10.5)
nRBC: 0 % (ref 0.0–0.2)

## 2022-06-20 LAB — COMPREHENSIVE METABOLIC PANEL
ALT: 12 U/L (ref 0–44)
AST: 11 U/L — ABNORMAL LOW (ref 15–41)
Albumin: 2.3 g/dL — ABNORMAL LOW (ref 3.5–5.0)
Alkaline Phosphatase: 54 U/L (ref 38–126)
Anion gap: 8 (ref 5–15)
BUN: 23 mg/dL (ref 8–23)
CO2: 30 mmol/L (ref 22–32)
Calcium: 8.4 mg/dL — ABNORMAL LOW (ref 8.9–10.3)
Chloride: 95 mmol/L — ABNORMAL LOW (ref 98–111)
Creatinine, Ser: 1.31 mg/dL — ABNORMAL HIGH (ref 0.44–1.00)
GFR, Estimated: 38 mL/min — ABNORMAL LOW (ref >60)
Glucose, Bld: 189 mg/dL — ABNORMAL HIGH (ref 70–99)
Potassium: 4.8 mmol/L (ref 3.5–5.1)
Sodium: 133 mmol/L — ABNORMAL LOW (ref 135–145)
Total Bilirubin: 0.5 mg/dL (ref 0.3–1.2)
Total Protein: 5.7 g/dL — ABNORMAL LOW (ref 6.5–8.1)

## 2022-06-20 LAB — GLUCOSE, CAPILLARY
Glucose-Capillary: 142 mg/dL — ABNORMAL HIGH (ref 70–99)
Glucose-Capillary: 235 mg/dL — ABNORMAL HIGH (ref 70–99)
Glucose-Capillary: 202 mg/dL — ABNORMAL HIGH (ref 70–99)
Glucose-Capillary: 281 mg/dL — ABNORMAL HIGH (ref 70–99)
Glucose-Capillary: 224 mg/dL — ABNORMAL HIGH (ref 70–99)

## 2022-06-20 LAB — MAGNESIUM: Magnesium: 2.3 mg/dL (ref 1.7–2.4)

## 2022-06-20 LAB — PHOSPHORUS: Phosphorus: 3 mg/dL (ref 2.5–4.6)

## 2022-06-20 MED ORDER — FUROSEMIDE 40 MG PO TABS
40.0000 mg | ORAL_TABLET | Freq: Every day | ORAL | Status: DC
  Administered 2022-06-20 – 2022-06-21 (×2): 40 mg via ORAL
  Filled 2022-06-20 (×2): qty 1

## 2022-06-20 NOTE — Progress Notes (Signed)
SATURATION QUALIFICATIONS: (This note is used to comply with regulatory documentation for home oxygen)  Patient Saturations on Room Air at Rest = 96%  Patient Saturations on Room Air while Ambulating = 76%  Patient Saturations on 2 Liters of oxygen while Ambulating = 84%

## 2022-06-20 NOTE — Inpatient Diabetes Management (Signed)
Inpatient Diabetes Program Recommendations  AACE/ADA: New Consensus Statement on Inpatient Glycemic Control (2015)  Target Ranges:  Prepandial:   less than 140 mg/dL      Peak postprandial:   less than 180 mg/dL (1-2 hours)      Critically ill patients:  140 - 180 mg/dL   Lab Results  Component Value Date   GLUCAP 142 (H) 06/20/2022   HGBA1C 8.5 (H) 06/09/2022    Review of Glycemic Control  Latest Reference Range & Units 06/19/22 11:20 06/19/22 15:46 06/19/22 19:42 06/20/22 08:29  Glucose-Capillary 70 - 99 mg/dL 213 (H) 227 (H) 286 (H) 142 (H)  (H): Data is abnormally high Diabetes history: Type 2 DM Outpatient Diabetes medications: Ozempic 0.5 mg qwk, Humalog 6 units QD, Levemir 25 units QHS Current orders for Inpatient glycemic control: Levemir 12 units QHS, Novolog 0-9 units TID Solumedrol 60 mg QD  Inpatient Diabetes Program Recommendations:    Consider: - Changing to carb modified diet if appropriate.   Thanks, Bronson Curb, MSN, RNC-OB Diabetes Coordinator [phone] (8a-5p)

## 2022-06-20 NOTE — Progress Notes (Signed)
PROGRESS NOTE  Melissa [name]: 1928/01/12  PCP: Darreld Mclean, MD NTI:[id number]  DOA: 06/09/2022  LOS: 11 days  Hospital Day: 12  Brief narrative: Melissa [name] is a 88 y.o. female with PMH significant for dementia, DM2, HTN, HLD, paroxysmal A-fib on Eliquis, CHF, nonischemic cardiomyopathy, CKD, GI bleed, chronic anemia, restless legs, hypothyroidism, essential tremors 8/27, patient was brought to the hospital with generalized weakness for a week X-ray showed multifocal pneumonia Respiratory virus panel was positive for parainfluenza.  Patient was also treated with empiric IV Rocephin and azithromycin for 3 days.  However patient's respiratory status did not improve. Repeat chest x-ray 9/3 showed worsening of infiltrates.  Patient was started on IV cefepime See below for details.  Subjective: Patient was seen and examined this morning.  Sitting up in chair.  On 2 L oxygen by nasal cannula.  Not in distress.  Looks a little sad this morning.  No family at bedside. Walked on the hallway with nursing staff this morning O2 sat down to 70s on room air.  Improved to 84 on 2 L.  Assessment and plan: Multifocal pneumonia Acute respiratory failure with hypoxia Initially considered pneumonia due to parainfluenza.  Was started on supportive care as well as short course of IV antibiotics without improvement. Repeat chest x-ray 9/3 showed worsening of infiltrates. Antibiotic coverage was broadened to IV cefepime.  Procalcitonin level however remains normal. Currently also on Solu-Medrol IV 60 mg daily, Mucinex, DuoNeb, Xopenex Currently on 2 L oxygen by nasal cannula.  Wean down as tolerated WBC count up likely because of steroids. Recent Labs  Lab 06/16/22 0046 06/17/22 0047 06/18/22 0117 06/19/22 0143 06/20/22 0051  WBC 9.2 10.4 12.5* 12.9* 13.7*  PROCALCITON <0.10 <0.10 <0.10  --   --    Chronic systolic CHF Essential hypertension Euvolemic at this time.  Blood pressure  mostly normal range. Currently on metoprolol 12.5 mg twice daily.  I would resume Lasix today which may improve her breathing.  Paroxysmal A-fib Currently on metoprolol Anticoagulated with Eliquis  CKD 4 Baseline creatinine 1.3 Currently creatinine at baseline. Recent Labs    06/11/22 0135 06/12/22 0437 06/13/22 0136 06/14/22 0259 06/15/22 0055 06/16/22 0046 06/17/22 0047 06/18/22 0117 06/19/22 0143 06/20/22 0051  BUN 9 7* '10 14 15 17 19 '$ 24* 22 23  CREATININE 1.20* 1.21* 1.29* 1.42* 1.43* 1.32* 1.33* 1.39* 1.27* 1.31*   Uncontrolled type 2 diabetes mellitus A1c 8.5 on 06/09/2022 Currently on Levemir 12 units daily along with sliding scale insulin.  Blood sugar level is running mostly over 200 probably because of steroids. Continue to monitor and adjust insulin. Switch to carb controlled diet. Recent Labs  Lab 06/19/22 1120 06/19/22 1546 06/19/22 1942 06/20/22 0829 06/20/22 1141  GLUCAP 213* 227* 286* 142* 235*    Anemia of chronic disease Baseline hemoglobin 11.  Currently hemoglobin running lower than average.  No active bleeding. Recent Labs    08/15/21 1459 12/06/21 1222 12/20/21 1703 06/09/22 1543 06/10/22 0408 06/16/22 0046 06/17/22 0047 06/18/22 0117 06/19/22 0143 06/20/22 0051  HGB  --  13.0   < > 11.0*   < > 9.0* 8.9* 10.1* 9.4* 9.7*  MCV  --  86.3   < > 82.6   < > 81.1 82.9 82.2 82.0 82.2  VITAMINB12 153* >1504*  --   --   --   --   --   --   --   --   FERRITIN  --   --   --  337*  --   --   --   --   --   --   RETICCTPCT  --   --   --  1.6  --   --   --   --   --   --    < > = values in this interval not displayed.     Hyperkalemia/hypomagnesemia Plan this morning with potassium normal.  Magnesium low at 1.6.  Magnesium replacement IV given. Recent Labs  Lab 06/16/22 0046 06/17/22 0047 06/18/22 0117 06/19/22 0143 06/20/22 0051  K 4.7 5.3* 4.5 4.1 4.8  MG 1.9 1.8 1.8 1.6* 2.3  PHOS 3.3 3.6 3.1 3.0 3.0   Hyponatremia Sodium low at 133  today.  Expected to improve with improving appetite.  Recent Labs  Lab 06/14/22 0259 06/15/22 0055 06/16/22 0046 06/17/22 0047 06/18/22 0117 06/19/22 0143 06/20/22 0051  NA 136 133* 133* 136 138 136 133*   Nausea and Vomiting Esophogram noted presbyesophagus.  No fixed stricture.   Restless leg syndrome  On Mirapex   Goals of care   Code Status: Full Code    Mobility: Encourage ambulation.  PT eval obtained.  Skin assessment:     Nutritional status:  Body mass index is 24.33 kg/m.          Diet:  Diet Order             Diet heart healthy/carb modified Room service appropriate? Yes; Fluid consistency: Thin  Diet effective now                   DVT prophylaxis:  apixaban (ELIQUIS) tablet 2.5 mg Start: 06/09/22 2200 SCDs Start: 06/09/22 1946 apixaban (ELIQUIS) tablet 2.5 mg   Antimicrobials: IV cefepime Fluid: None Consultants: None Family Communication: Son at bedside  Status is: Inpatient  Continue in-hospital care because: To monitor respiratory status for next 24 to 48 hours.  Check oxygen requirement on ambulation. Level of care: Med-Surg   Dispo: The patient is from: Home              Anticipated d/c is to: Hopefully home in 1 to 2 days              Patient currently is not medically stable to d/c.   Difficult to place patient No     Infusions:   ceFEPime (MAXIPIME) IV Stopped (06/19/22 1820)    Scheduled Meds:  apixaban  2.5 mg Oral BID   dextromethorphan-guaiFENesin  1 tablet Oral BID   FLUoxetine  20 mg Oral Q breakfast   furosemide  40 mg Oral Daily   insulin aspart  0-9 Units Subcutaneous TID WC   insulin detemir  12 Units Subcutaneous QHS   levothyroxine  50 mcg Oral Q0600   loratadine  10 mg Oral Daily   methylPREDNISolone (SOLU-MEDROL) injection  60 mg Intravenous Daily   metoprolol tartrate  12.5 mg Oral BID   pantoprazole  40 mg Oral BID WC   pramipexole  0.5 mg Oral QPM   QUEtiapine  100 mg Oral QHS   traZODone   100 mg Oral QHS    PRN meds: acetaminophen **OR** acetaminophen, butalbital-acetaminophen-caffeine, hydrALAZINE, levalbuterol, metoprolol tartrate, ondansetron (ZOFRAN) IV, senna-docusate   Antimicrobials: Anti-infectives (From admission, onward)    Start     Dose/Rate Route Frequency Ordered Stop   06/16/22 1830  ceFEPIme (MAXIPIME) 1 g in sodium chloride 0.9 % 100 mL IVPB        1 g 200  mL/hr over 30 Minutes Intravenous Every 24 hours 06/16/22 1742     06/09/22 1915  cefTRIAXone (ROCEPHIN) 2 g in sodium chloride 0.9 % 100 mL IVPB  Status:  Discontinued        2 g 200 mL/hr over 30 Minutes Intravenous Every 24 hours 06/09/22 1913 06/11/22 0806   06/09/22 1915  azithromycin (ZITHROMAX) 500 mg in sodium chloride 0.9 % 250 mL IVPB  Status:  Discontinued        500 mg 250 mL/hr over 60 Minutes Intravenous Every 24 hours 06/09/22 1913 06/11/22 0806       Objective: Vitals:   06/20/22 0500 06/20/22 0830  BP: 129/72 (!) 127/98  Pulse: 96 97  Resp: 20 18  Temp: 98.2 F (36.8 C) 98.3 F (36.8 C)  SpO2: 95% (!) 84%    Intake/Output Summary (Last 24 hours) at 06/20/2022 1159 Last data filed at 06/20/2022 0300 Gross per 24 hour  Intake 250 ml  Output --  Net 250 ml   Filed Weights   06/14/22 0500 06/19/22 0518 06/20/22 0500  Weight: 66 kg 64.3 kg 64.3 kg   Weight change: -0.02 kg Body mass index is 24.33 kg/m.   Physical Exam: General exam: Pleasant, elderly Caucasian female.  Not in physical distress at this time Skin: No rashes, lesions or ulcers. HEENT: Atraumatic, normocephalic, no obvious bleeding Lungs: Diminished air entry in both bases.  Mild end expiratory wheezing bilaterally.  No cough on deep breathing today. CVS: Regular rate and rhythm, no murmur GI/Abd soft, nontender, nondistended, bowel sound present CNS: Alert, awake, oriented x3 Psychiatry: Mood appropriate Extremities: No pedal edema, no calf tenderness  Data Review: I have personally reviewed the  laboratory data and studies available.  F/u labs ordered Unresulted Labs (From admission, onward)     Start     Ordered   06/17/22 0500  CBC with Differential/Platelet  Daily at 5am,   R     Question:  Specimen collection method  Answer:  Lab=Lab collect   06/16/22 1742   06/17/22 0500  Comprehensive metabolic panel  Daily at 5am,   R     Question:  Specimen collection method  Answer:  Lab=Lab collect   06/16/22 1742   06/17/22 0500  Magnesium  Daily at 5am,   R     Question:  Specimen collection method  Answer:  Lab=Lab collect   06/16/22 1742   06/17/22 0500  Phosphorus  Daily at 5am,   R     Question:  Specimen collection method  Answer:  Lab=Lab collect   06/16/22 1742   06/16/22 1747  Expectorated Sputum Assessment w Gram Stain, Rflx to [place],   URGENT        06/16/22 1746            Signed, Terrilee Croak, MD [facility] 06/20/2022

## 2022-06-20 NOTE — Plan of Care (Signed)

## 2022-06-21 DIAGNOSIS — J189 Pneumonia, unspecified organism: Secondary | ICD-10-CM | POA: Diagnosis not present

## 2022-06-21 LAB — CBC WITH DIFFERENTIAL/PLATELET
Abs Immature Granulocytes: 0.37 10*3/uL — ABNORMAL HIGH (ref 0.00–0.07)
Basophils Absolute: 0.1 10*3/uL (ref 0.0–0.1)
Basophils Relative: 0 %
Eosinophils Absolute: 0 10*3/uL (ref 0.0–0.5)
Eosinophils Relative: 0 %
HCT: 35.7 % — ABNORMAL LOW (ref 36.0–46.0)
Hemoglobin: 11.2 g/dL — ABNORMAL LOW (ref 12.0–15.0)
Immature Granulocytes: 3 %
Lymphocytes Relative: 16 %
Lymphs Abs: 2.2 10*3/uL (ref 0.7–4.0)
MCH: 25.5 pg — ABNORMAL LOW (ref 26.0–34.0)
MCHC: 31.4 g/dL (ref 30.0–36.0)
MCV: 81.1 fL (ref 80.0–100.0)
Monocytes Absolute: 1 10*3/uL (ref 0.1–1.0)
Monocytes Relative: 7 %
Neutro Abs: 10.2 10*3/uL — ABNORMAL HIGH (ref 1.7–7.7)
Neutrophils Relative %: 74 %
Platelets: 430 10*3/uL — ABNORMAL HIGH (ref 150–400)
RBC: 4.4 MIL/uL (ref 3.87–5.11)
RDW: 14.4 % (ref 11.5–15.5)
WBC: 13.9 10*3/uL — ABNORMAL HIGH (ref 4.0–10.5)
nRBC: 0 % (ref 0.0–0.2)

## 2022-06-21 LAB — COMPREHENSIVE METABOLIC PANEL
ALT: 12 U/L (ref 0–44)
AST: 14 U/L — ABNORMAL LOW (ref 15–41)
Albumin: 2.7 g/dL — ABNORMAL LOW (ref 3.5–5.0)
Alkaline Phosphatase: 61 U/L (ref 38–126)
Anion gap: 10 (ref 5–15)
BUN: 22 mg/dL (ref 8–23)
CO2: 31 mmol/L (ref 22–32)
Calcium: 8.6 mg/dL — ABNORMAL LOW (ref 8.9–10.3)
Chloride: 93 mmol/L — ABNORMAL LOW (ref 98–111)
Creatinine, Ser: 1.34 mg/dL — ABNORMAL HIGH (ref 0.44–1.00)
GFR, Estimated: 37 mL/min — ABNORMAL LOW (ref >60)
Glucose, Bld: 174 mg/dL — ABNORMAL HIGH (ref 70–99)
Potassium: 4.1 mmol/L (ref 3.5–5.1)
Sodium: 134 mmol/L — ABNORMAL LOW (ref 135–145)
Total Bilirubin: 0.4 mg/dL (ref 0.3–1.2)
Total Protein: 6.5 g/dL (ref 6.5–8.1)

## 2022-06-21 LAB — PHOSPHORUS: Phosphorus: 2.9 mg/dL (ref 2.5–4.6)

## 2022-06-21 LAB — GLUCOSE, CAPILLARY
Glucose-Capillary: 221 mg/dL — ABNORMAL HIGH (ref 70–99)
Glucose-Capillary: 261 mg/dL — ABNORMAL HIGH (ref 70–99)
Glucose-Capillary: 306 mg/dL — ABNORMAL HIGH (ref 70–99)

## 2022-06-21 LAB — MAGNESIUM: Magnesium: 1.8 mg/dL (ref 1.7–2.4)

## 2022-06-21 MED ORDER — ALBUTEROL SULFATE HFA 108 (90 BASE) MCG/ACT IN AERS: 2.0000 | INHALATION_SPRAY | Freq: Four times a day (QID) | RESPIRATORY_TRACT | 0 refills | Status: DC | PRN

## 2022-06-21 MED ORDER — GUAIFENESIN-DM 100-10 MG/5ML PO SYRP: 5.0000 mL | ORAL_SOLUTION | ORAL | 0 refills | Status: DC | PRN

## 2022-06-21 MED ORDER — METOPROLOL SUCCINATE ER 25 MG PO TB24
50.0000 mg | ORAL_TABLET | Freq: Every day | ORAL | 2 refills | Status: DC
Start: 2022-06-21 — End: 2022-08-27

## 2022-06-21 MED ORDER — LEVEMIR FLEXTOUCH 100 UNIT/ML ~~LOC~~ SOPN: 15.0000 [IU] | PEN_INJECTOR | Freq: Every day | SUBCUTANEOUS | 3 refills | Status: DC

## 2022-06-21 MED ORDER — PREDNISONE 10 MG PO TABS: ORAL_TABLET | ORAL | 0 refills | Status: DC

## 2022-06-21 NOTE — Plan of Care (Signed)

## 2022-06-21 NOTE — TOC Transition Note (Signed)
Transition of Care [facility]) - CM/SW Discharge Note   Patient Details  Name: Melissa [name] MRN: [id number] Date of Birth: [birth year]  Transition of Care [facility]) CM/SW Contact:  Sharin Mons, RN Phone Number: 06/21/2022, 12:23 PM   Clinical Narrative:    Patient will DC to: home Anticipated DC date: 06/21/2022 Family notified: yes Transport by: car  Admitted with multifocal PNA. Per MD patient ready for DC today. RN, patient, patient's daughter [name] made aware of d/c plan. Daughter states pt is active with [facility]. Hoyle Sauer [phone]) with [facility] and Hospice notified of DC and oxygen need. Hoyle Sauer to make referral for home oxygen. Equipment will be delivered to pt's bedside prior to d/c . Post hospital f/u noted on AVS. Daughter to provide transportation to home.  Pt/daughter without Rx med concerns.  Fransisco Hertz Daughter [phone]  RNCM will sign off for now as intervention is no longer needed. Please consult [place] again if new needs arise.    Final next level of care: [place] (resumption of Home hospice care with [facility]) Barriers to Discharge: No Barriers Identified   Patient Goals and CMS Choice     Choice offered to / list presented to : Patient  Discharge Placement                       Discharge Plan and Services                DME Arranged: Oxygen ([place] with [employer] to arrange DME oxygen)   Date DME Agency Contacted: 06/21/22 Time DME Agency Contacted: 1209 Representative spoke with at DME Agency: [place] ([place]) Interventions     Readmission Risk Interventions    08/16/2021   11:00 AM 04/16/2021    3:30 PM  Readmission Risk Prevention Plan  Transportation Screening Complete Complete  PCP or Specialist Appt within 3-5 Days  Complete  HRI or [place]  Complete  Social Work Consult for [place] Planning/Counseling  Complete   Palliative Care Screening  Not Applicable  Medication Review [employer]) Complete Complete  PCP or Specialist appointment within 3-5 days of discharge Complete   HRI or Home Care Consult Complete   SW Recovery Care/Counseling Consult Complete

## 2022-06-21 NOTE — Progress Notes (Signed)
SATURATION QUALIFICATIONS: (This note is used to comply with regulatory documentation for home oxygen)  Patient Saturations on Room Air at Rest = 88%  Patient Saturations on Room Air while Ambulating = 86%  Patient Saturations on 1 Liters of oxygen while Ambulating = 95%

## 2022-06-21 NOTE — Discharge Summary (Addendum)
Physician Discharge Summary  Melissa [name] NWG:[id number] DOB: Oct 22, 1927 DOA: 06/09/2022  PCP: Darreld Mclean, MD  Admit date: 06/09/2022 Discharge date: 06/21/2022  Admitted From: Home Discharge disposition: Home with oxygen.  Okay to resume hospice care postdischarge.  Recommendations at discharge:  Complete tapering course of steroids Continue DuoNeb, Mucinex Oxygen on ambulation Insulin dose has been reduced.  Adjust based on blood sugar response.    Brief narrative: Melissa [name] is a 88 y.o. female with PMH significant for dementia, DM2, HTN, HLD, paroxysmal A-fib on Eliquis, CHF, nonischemic cardiomyopathy, CKD, GI bleed, chronic anemia, restless legs, hypothyroidism, essential tremors 8/27, patient was brought to the hospital with generalized weakness for a week X-ray showed multifocal pneumonia Respiratory virus panel was positive for parainfluenza.  Patient was also treated with empiric IV Rocephin and azithromycin for 3 days.  However patient's respiratory status did not improve. Repeat chest x-ray 9/3 showed worsening of infiltrates.  Patient was started on IV cefepime See below for details.  Subjective: Patient was seen and examined this morning. Sitting up in chair.  Not in distress.  Only on 1 L/min of oxygen.  Lungs clear to auscultation.  Family at bedside.  On ambulation, she required 1 L oxygen to maintain saturation over 90%.  Assessment and plan: Multifocal pneumonia Acute respiratory failure with hypoxia Initially considered pneumonia due to parainfluenza.  Was started on supportive care as well as short course of IV antibiotics without improvement. Repeat chest x-ray 9/3 showed worsening of infiltrates. Antibiotic coverage was broadened to IV cefepime.  Procalcitonin level however remains normal. Currently also on Solu-Medrol IV 60 mg daily, Mucinex, DuoNeb, bronchodilators  Respiratory status gradually improving.  This morning at rest, she is only on  1 L/min of oxygen.  Lungs clear to auscultation.  On ambulation, she required 1 L oxygen to maintain saturation over 90%. At discharge, I would maintain her on a tapering course of prednisone.  Completed the course of antibiotics with IV cefepime.  Continue Mucinex and bronchodilator. Recent Labs  Lab 06/16/22 0046 06/17/22 0047 06/18/22 0117 06/19/22 0143 06/20/22 0051 06/21/22 0109  WBC 9.2 10.4 12.5* 12.9* 13.7* 13.9*  PROCALCITON <0.10 <0.10 <0.10  --   --   --    Chronic systolic CHF Essential hypertension Euvolemic at this time.  Blood pressure mostly in normal range. Currently on metoprolol.  Lasix has been resumed.  Paroxysmal A-fib Currently on metoprolol Anticoagulated with Eliquis  CKD 4 Baseline creatinine 1.3 Currently creatinine at baseline. Recent Labs    06/12/22 0437 06/13/22 0136 06/14/22 0259 06/15/22 0055 06/16/22 0046 06/17/22 0047 06/18/22 0117 06/19/22 0143 06/20/22 0051 06/21/22 0109  BUN 7* _0 24* _1 CREATININE 1.21* 1.29* 1.42* 1.43* 1.32* 1.33* 1.39* 1.27* 1.31* 1.34*   Uncontrolled type 2 diabetes mellitus A1c 8.5 on 06/09/2022 PTA patient is requiring up to 25 units of Lantus. Currently on Levemir 12 units daily along with sliding scale insulin.  Blood sugar level is running mostly over 200 probably because of steroids. At discharge, will resume Levemir at 15 units daily. Expect improvement with tapering course of steroids. Recent Labs  Lab 06/20/22 1321 06/20/22 1629 06/20/22 2116 06/21/22 0752 06/21/22 1148  GLUCAP 202* 281* 224* 221* 261*    Anemia of chronic disease Baseline hemoglobin 11.  Currently hemoglobin running lower than average.  No active bleeding. Recent Labs    08/15/21 1459 12/06/21 1222 12/20/21 1703 06/09/22 1543 06/10/22 0408 06/17/22 0047 06/18/22  0117 06/19/22 0143 06/20/22 0051 06/21/22 0109  HGB  --  13.0   < > 11.0*   < > 8.9* 10.1* 9.4* 9.7* 11.2*  MCV  --  86.3   < > 82.6    < > 82.9 82.2 82.0 82.2 81.1  VITAMINB12 153* >1504*  --   --   --   --   --   --   --   --   FERRITIN  --   --   --  337*  --   --   --   --   --   --   RETICCTPCT  --   --   --  1.6  --   --   --   --   --   --    < > = values in this interval not displayed.     Hyperkalemia/hypomagnesemia Levels improved in subsequent recheck. Recent Labs  Lab 06/17/22 0047 06/18/22 0117 06/19/22 0143 06/20/22 0051 06/21/22 0109  K 5.3* 4.5 4.1 4.8 4.1  MG 1.8 1.8 1.6* 2.3 1.8  PHOS 3.6 3.1 3.0 3.0 2.9   Hyponatremia Sodium low at 134 today.  Expected to improve with improving appetite.  Recent Labs  Lab 06/15/22 0055 06/16/22 0046 06/17/22 0047 06/18/22 0117 06/19/22 0143 06/20/22 0051 06/21/22 0109  NA 133* 133* 136 138 136 133* 134*   Nausea and Vomiting Esophogram noted presbyesophagus.  No fixed stricture.   Restless leg syndrome On Mirapex  Wounds:  -    Discharge Exam:   Vitals:   06/21/22 0500 06/21/22 0509 06/21/22 0756 06/21/22 1150  BP:  (!) 146/80 125/61 116/72  Pulse:  (!) 108 (!) 103 92  Resp:  _0 Temp:  98.8 F (37.1 C) 98.2 F (36.8 C) 97.6 F (36.4 C)  TempSrc:  Oral Oral Oral  SpO2:  98% 97% 99%  Weight: 65.3 kg     Height:        Body mass index is 24.71 kg/m.   General exam: Pleasant, elderly Caucasian female.  Not in physical distress at this time Skin: No rashes, lesions or ulcers. HEENT: Atraumatic, normocephalic, no obvious bleeding Lungs: Improved bilateral air entry.  No crackles or wheezing.   CVS: Regular rate and rhythm, no murmur GI/Abd soft, nontender, nondistended, bowel sound present CNS: Alert, awake, oriented x3 Psychiatry: Mood appropriate Extremities: No pedal edema, no calf tenderness  Follow ups:    Follow-up Information     Copland, Gay Filler, MD Follow up.   Specialty: Family Medicine Contact information: [address] [place] [address] [phone]         [name], MD .    Specialty: Cardiology Contact information: [phone] [address] [place] [address] [phone]                 Discharge Instructions:   Discharge Instructions     Amb Referral to Palliative Care   Complete by: As directed    Call MD for:  difficulty breathing, headache or visual disturbances   Complete by: As directed    Call MD for:  extreme fatigue   Complete by: As directed    Call MD for:  hives   Complete by: As directed    Call MD for:  persistant dizziness or light-headedness   Complete by: As directed    Call MD for:  persistant nausea and vomiting   Complete by: As directed  Call MD for:  severe uncontrolled pain   Complete by: As directed    Call MD for:  temperature >100.4   Complete by: As directed    Diet - low sodium heart healthy   Complete by: As directed    Discharge instructions   Complete by: As directed    Recommendations at discharge:   Complete tapering course of steroids  Continue DuoNeb, Mucinex  Oxygen on ambulation  Insulin dose has been reduced.  Adjust based on blood sugar response.  Discharge instructions for CHF Check weight daily -preferably same time every day. Restrict fluid intake to 1200 ml daily Restrict salt intake to less than 2 g daily. Call MD if you have one of the following symptoms 1) 3 pound weight gain in 24 hours or 5 pounds in 1 week  2) swelling in the hands, feet or stomach  3) progressive shortness of breath 4) if you have to sleep on extra pillows at night in order to breathe     General discharge instructions: Follow with Primary MD Copland, Gay Filler, MD in 7 days  Please request your PCP  to go over your hospital tests, procedures, radiology results at the follow up. Please get your medicines reviewed and adjusted.  Your PCP may decide to repeat certain labs or tests as needed. Do not drive, operate heavy machinery, perform activities at heights, swimming or participation in  water activities or provide baby sitting services if your were admitted for syncope or siezures until you have seen by Primary MD or a Neurologist and advised to do so again. [place] Controlled Substance Reporting System database was reviewed. Do not drive, operate heavy machinery, perform activities at heights, swim, participate in water activities or provide baby-sitting services while on medications for pain, sleep and mood until your outpatient physician has reevaluated you and advised to do so again.  You are strongly recommended to comply with the dose, frequency and duration of prescribed medications. Activity: As tolerated with Full fall precautions use walker/cane & assistance as needed Avoid using any recreational substances like cigarette, tobacco, alcohol, or non-prescribed drug. If you experience worsening of your admission symptoms, develop shortness of breath, life threatening emergency, suicidal or homicidal thoughts you must seek medical attention immediately by calling 911 or calling your MD immediately  if symptoms less severe. You must read complete instructions/literature along with all the possible adverse reactions/side effects for all the medicines you take and that have been prescribed to you. Take any new medicine only after you have completely understood and accepted all the possible adverse reactions/side effects.  Wear Seat belts while driving. You were cared for by a hospitalist during your hospital stay. If you have any questions about your discharge medications or the care you received while you were in the hospital after you are discharged, you can call the unit and ask to speak with the hospitalist or the covering physician. Once you are discharged, your primary care physician will handle any further medical issues. Please note that NO REFILLS for any discharge medications will be authorized once you are discharged, as it is imperative that you return to your primary  care physician (or establish a relationship with a primary care physician if you do not have one).   Increase activity slowly   Complete by: As directed        Discharge Medications:   Allergies as of 06/21/2022   No Known Allergies      Medication List  STOP taking these medications    amoxicillin-clavulanate 875-125 MG tablet Commonly known as: AUGMENTIN   atorvastatin 40 MG tablet Commonly known as: LIPITOR   cephALEXin 250 MG capsule Commonly known as: KEFLEX   HYDROcodone-acetaminophen 5-325 MG tablet Commonly known as: NORCO/VICODIN   ICaps Areds 2 Caps   metoprolol tartrate 50 MG tablet Commonly known as: LOPRESSOR   Ozempic (0.25 or 0.5 MG/DOSE) 2 MG/1.5ML Sopn Generic drug: Semaglutide(0.25 or 0.5MG/DOS)   Perforomist 20 MCG/2ML nebulizer solution Generic drug: formoterol   sulfamethoxazole-trimethoprim 400-80 MG tablet Commonly known as: BACTRIM   Yupelri 175 MCG/3ML nebulizer solution Generic drug: revefenacin       TAKE these medications    albuterol 108 (90 Base) MCG/ACT inhaler Commonly known as: VENTOLIN HFA Inhale 2 puffs into the lungs every 6 (six) hours as needed for wheezing or shortness of breath.   blood glucose meter kit and supplies Kit Dispense based on patient and insurance preference. Use up to four times daily as directed.   Blood Glucose Monitoring Suppl Devi Dispense one meter to use and monitor glucose as needed   cetirizine 10 MG tablet Commonly known as: ZYRTEC Take 10 mg by mouth in the morning.   cyanocobalamin 1000 MCG tablet Commonly known as: VITAMIN B12 Take 1 tablet (1,000 mcg total) by mouth daily. What changed: when to take this   Eliquis 2.5 MG Tabs tablet Generic drug: apixaban TAKE 1 TABLET TWICE A DAY What changed:  how much to take when to take this additional instructions   Fish Oil 1000 MG Caps Take 1,000 mg by mouth daily with supper.   FLUoxetine 20 MG capsule Commonly known as:  PROzac Take 1 capsule (20 mg total) by mouth daily. What changed: when to take this   Flutter Devi 1 Device by Does not apply route as needed.   furosemide 40 MG tablet Commonly known as: LASIX TAKE 1 TABLET DAILY What changed: when to take this   guaiFENesin-dextromethorphan 100-10 MG/5ML syrup Commonly known as: ROBITUSSIN DM Take 5 mLs by mouth every 4 (four) hours as needed for cough.   HumaLOG KwikPen 100 UNIT/ML KwikPen Generic drug: insulin lispro Inject 6 units under the skin daily with supper. What changed:  how much to take when to take this additional instructions   Integra 62.5-62.5-40-3 MG Caps TAKE 1 CAPSULE DAILY What changed: when to take this   Levemir FlexTouch 100 UNIT/ML FlexPen Generic drug: insulin detemir Inject 15 Units into the skin at bedtime. What changed: how much to take   levothyroxine 50 MCG tablet Commonly known as: SYNTHROID TAKE 1 TABLET DAILY BEFORE BREAKFAST   metoprolol succinate 25 MG 24 hr tablet Commonly known as: Toprol XL Take 2 tablets (50 mg total) by mouth daily. Take with or immediately following a meal.   OneTouch Delica Lancets 70J Misc Use to check blood sugar 4 times per day dx code E11.29   OneTouch Verio test strip Generic drug: glucose blood USE AS INSTRUCTED TO CHECK BLOOD SUGAR 4 TIMES PER DAY   pantoprazole 40 MG tablet Commonly known as: PROTONIX Take 1 tablet (40 mg total) by mouth 2 (two) times daily. Continue taking once a day after finishing the course of twice a day for  2 months What changed:  when to take this additional instructions   polyethylene glycol 17 g packet Commonly known as: MIRALAX / GLYCOLAX Take 17 g by mouth daily as needed (constipation.).   pramipexole 0.25 MG tablet Commonly known as: Mirapex  Take 1 tablet (0.25 mg total) by mouth daily with supper. Take 1 tablet at 5 pm What changed:  how much to take when to take this additional instructions   predniSONE 10 MG  tablet Commonly known as: DELTASONE Take 4 tablets daily X 2 days, then, Take 3 tablets daily X 2 days, then, Take 2 tablets daily X 2 days, then, Take 1 tablets daily X 1 day.   pregabalin 100 MG capsule Commonly known as: LYRICA Take 100 mg by mouth daily. What changed: Another medication with the same name was removed. Continue taking this medication, and follow the directions you see here.   QUEtiapine 50 MG tablet Commonly known as: SEROQUEL TAKE 2 TABLETS AT BEDTIME   senna 8.6 MG Tabs tablet Commonly known as: SENOKOT Take 2 tablets by mouth daily as needed (constipation).   sucralfate 1 GM/10ML suspension Commonly known as: CARAFATE Take 10 mLs (1 g total) by mouth 2 (two) times daily. Use as needed for stomach pain What changed:  when to take this reasons to take this additional instructions   traZODone 100 MG tablet Commonly known as: DESYREL Take 1 tablet (100 mg total) by mouth at bedtime.   Vitamin D3 25 MCG (1000 UT) Caps Take 1,000 Units by mouth every evening.               Durable Medical Equipment  (From admission, onward)           Start     Ordered   06/21/22 1144  For home use only DME oxygen  Once       Question Answer Comment  Length of Need Lifetime   Mode or (Route) Nasal cannula   Liters per Minute 2   Frequency Continuous (stationary and portable oxygen unit needed)   Oxygen conserving device Yes   Oxygen delivery system Gas      06/21/22 1143             The results of significant diagnostics from this hospitalization (including imaging, microbiology, ancillary and laboratory) are listed below for reference.    Procedures and Diagnostic Studies:   DG Chest Port 1 View  Result Date: 06/09/2022 CLINICAL DATA:  Shortness of breath.  Hypoxia. EXAM: PORTABLE CHEST 1 VIEW COMPARISON:  None Available. FINDINGS: Bilateral infiltrates, right greater than left, with a peripheral distribution. The heart, hila, mediastinum,  lungs, and pleura are otherwise unremarkable. IMPRESSION: Bilateral pulmonary infiltrates worrisome for multifocal pneumonia. Recommend follow-up to complete resolution. Electronically Signed   By: Dorise Bullion III M.D.   On: 06/09/2022 16:32     Labs:   Basic Metabolic Panel: Recent Labs  Lab 06/17/22 0047 06/18/22 0117 06/19/22 0143 06/20/22 0051 06/21/22 0109  NA 136 138 136 133* 134*  K 5.3* 4.5 4.1 4.8 4.1  CL 99 98 97* 95* 93*  CO2 29 33* _0 GLUCOSE 175* 180* 177* 189* 174*  BUN 19 24* _1 CREATININE 1.33* 1.39* 1.27* 1.31* 1.34*  CALCIUM 8.9 9.0 8.4* 8.4* 8.6*  MG 1.8 1.8 1.6* 2.3 1.8  PHOS 3.6 3.1 3.0 3.0 2.9   GFR Estimated Creatinine Clearance: 23.1 mL/min (A) (by C-G formula based on SCr of 1.34 mg/dL (H)). Liver Function Tests: Recent Labs  Lab 06/17/22 0047 06/18/22 0117 06/19/22 0143 06/20/22 0051 06/21/22 0109  AST 12* 11* 11* 11* 14*  ALT _2 ALKPHOS 54 63 53 54 61  BILITOT 0.5 0.2* 0.3 0.5  0.4  PROT 6.5 6.4* 5.5* 5.7* 6.5  ALBUMIN 2.9* 2.7* 2.2* 2.3* 2.7*   No results for input(s): "LIPASE", "AMYLASE" in the last 168 hours. No results for input(s): "AMMONIA" in the last 168 hours. Coagulation profile No results for input(s): "INR", "PROTIME" in the last 168 hours.  CBC: Recent Labs  Lab 06/17/22 0047 06/18/22 0117 06/19/22 0143 06/20/22 0051 06/21/22 0109  WBC 10.4 12.5* 12.9* 13.7* 13.9*  NEUTROABS 7.8* 9.1* 9.6* 10.3* 10.2*  HGB 8.9* 10.1* 9.4* 9.7* 11.2*  HCT 28.1* 31.9* 30.1* 30.5* 35.7*  MCV 82.9 82.2 82.0 82.2 81.1  PLT 352 399 365 359 430*   Cardiac Enzymes: No results for input(s): "CKTOTAL", "CKMB", "CKMBINDEX", "TROPONINI" in the last 168 hours. BNP: Invalid input(s): "POCBNP" CBG: Recent Labs  Lab 06/20/22 1321 06/20/22 1629 06/20/22 2116 06/21/22 0752 06/21/22 1148  GLUCAP 202* 281* 224* 221* 261*   D-Dimer No results for input(s): "DDIMER" in the last 72 hours. Hgb A1c No results  for input(s): "HGBA1C" in the last 72 hours. Lipid Profile No results for input(s): "CHOL", "HDL", "LDLCALC", "TRIG", "CHOLHDL", "LDLDIRECT" in the last 72 hours. Thyroid function studies No results for input(s): "TSH", "T4TOTAL", "T3FREE", "THYROIDAB" in the last 72 hours.  Invalid input(s): "FREET3" Anemia work up No results for input(s): "VITAMINB12", "FOLATE", "FERRITIN", "TIBC", "IRON", "RETICCTPCT" in the last 72 hours. Microbiology No results found for this or any previous visit (from the past 240 hour(s)).  Time coordinating discharge: 35 minutes  Signed: Nera Haworth  [facility] 06/21/2022, 11:58 AM

## 2022-06-21 NOTE — Progress Notes (Signed)
Physical Therapy Treatment Patient Details Name: Melissa [name] MRN: [id number] DOB: 01-09-1928 Today's Date: 06/21/2022   History of Present Illness Pt is a 88 y/o female admitted for progressive SOB and generalized weakness in setting of multifocal pneumonia. PMH: CHF, CKD, DM2, a fib, dementia.    PT Comments    Patient eager to mobilize and supervision only for sit<>stand today. Pt required min guard at start of ambulation with RW however as gait progressed required min assist to steady and manage walker. Pt desat on RA to 84% with gait and improved to 98% with 2L/min. Cues needed for standing rest breaks to conserve energy during gait. Acute PT will continue to progress in acute setting. Patient is from home with hospice services and currently plans to return home with increased hospice care if possible.     Recommendations for follow up therapy are one component of a multi-disciplinary discharge planning process, led by the attending physician.  Recommendations may be updated based on patient status, additional functional criteria and insurance authorization.  Follow Up Recommendations  No PT follow up     Assistance Recommended at Discharge PRN  Patient can return home with the following A little help with walking and/or transfers;A little help with bathing/dressing/bathroom;Direct supervision/assist for medications management;Assist for transportation;Help with stairs or ramp for entrance   Equipment Recommendations  None recommended by PT    Recommendations for Other Services       Precautions / Restrictions Precautions Precautions: Fall;Other (comment) Precaution Comments: watch O2 Restrictions Weight Bearing Restrictions: No     Mobility  Bed Mobility               General bed mobility comments: pt OOB in recliner    Transfers Overall transfer level: Needs assistance Equipment used: Rolling walker (2 wheels) Transfers: Sit to/from Stand Sit to Stand:  Supervision           General transfer comment: sup for safety with cues to wait on therapist for line set up.    Ambulation/Gait Ambulation/Gait assistance: Min guard, Min assist Gait Distance (Feet): 120 Feet Assistive device: Rolling walker (2 wheels) Gait Pattern/deviations: Step-through pattern, Decreased stride length Gait velocity: decr     General Gait Details: slow pace, min assist to guide walker intermittently, assist to steady with turns.   Stairs             Wheelchair Mobility    Modified Rankin (Stroke Patients Only)       Balance Overall balance assessment: Needs assistance, History of Falls Sitting-balance support: No upper extremity supported, Feet supported Sitting balance-Leahy Scale: Good     Standing balance support: Bilateral upper extremity supported, During functional activity Standing balance-Leahy Scale: Fair Standing balance comment: able to statically stand at the bed side without UE support                            Cognition Arousal/Alertness: Awake/alert Behavior During Therapy: WFL for tasks assessed/performed Overall Cognitive Status: History of cognitive impairments - at baseline                                 General Comments: hx of dementia, pleasant and follows directions consistently, memory deficits, slightly delayed with following commands.        Exercises      General Comments        Pertinent Vitals/Pain  Pain Assessment Pain Assessment: No/denies pain    Home Living                          Prior Function            PT Goals (current goals can now be found in the care plan section) Acute Rehab PT Goals Patient Stated Goal: did not state PT Goal Formulation: With patient Time For Goal Achievement: 06/25/22 Potential to Achieve Goals: Good Progress towards PT goals: Progressing toward goals    Frequency    Min 3X/week      PT Plan Current plan  remains appropriate    Co-evaluation              AM-PAC PT "6 Clicks" Mobility   Outcome Measure  Help needed turning from your back to your side while in a flat bed without using bedrails?: None Help needed moving from lying on your back to sitting on the side of a flat bed without using bedrails?: None Help needed moving to and from a bed to a chair (including a wheelchair)?: A Little Help needed standing up from a chair using your arms (e.g., wheelchair or bedside chair)?: A Little Help needed to walk in hospital room?: A Little Help needed climbing 3-5 steps with a railing? : A Little 6 Click Score: 20    End of Session Equipment Utilized During Treatment: Oxygen Activity Tolerance: Patient tolerated treatment well Patient left: in chair;with call bell/phone within reach;with chair alarm set;with family/visitor present Nurse Communication: Mobility status PT Visit Diagnosis: Difficulty in walking, not elsewhere classified (R26.2)     Time: 9150-5697 PT Time Calculation (min) (ACUTE ONLY): 23 min  Charges:  $Gait Training: 8-22 mins                     Verner Mould, DPT Acute Rehabilitation Services Office [phone] Pager [phone]  06/21/22 3:20 PM

## 2022-06-22 DIAGNOSIS — G311 Senile degeneration of brain, not elsewhere classified: Secondary | ICD-10-CM | POA: Diagnosis not present

## 2022-06-22 DIAGNOSIS — I480 Paroxysmal atrial fibrillation: Secondary | ICD-10-CM | POA: Diagnosis not present

## 2022-06-22 DIAGNOSIS — E1122 Type 2 diabetes mellitus with diabetic chronic kidney disease: Secondary | ICD-10-CM | POA: Diagnosis not present

## 2022-06-22 DIAGNOSIS — D631 Anemia in chronic kidney disease: Secondary | ICD-10-CM | POA: Diagnosis not present

## 2022-06-22 DIAGNOSIS — G2581 Restless legs syndrome: Secondary | ICD-10-CM | POA: Diagnosis not present

## 2022-06-22 DIAGNOSIS — F0280 Dementia in other diseases classified elsewhere without behavioral disturbance: Secondary | ICD-10-CM | POA: Diagnosis not present

## 2022-06-22 DIAGNOSIS — F419 Anxiety disorder, unspecified: Secondary | ICD-10-CM | POA: Diagnosis not present

## 2022-06-22 DIAGNOSIS — E785 Hyperlipidemia, unspecified: Secondary | ICD-10-CM | POA: Diagnosis not present

## 2022-06-22 DIAGNOSIS — Z8719 Personal history of other diseases of the digestive system: Secondary | ICD-10-CM | POA: Diagnosis not present

## 2022-06-22 DIAGNOSIS — N184 Chronic kidney disease, stage 4 (severe): Secondary | ICD-10-CM | POA: Diagnosis not present

## 2022-06-22 DIAGNOSIS — Z9981 Dependence on supplemental oxygen: Secondary | ICD-10-CM | POA: Diagnosis not present

## 2022-06-22 DIAGNOSIS — I429 Cardiomyopathy, unspecified: Secondary | ICD-10-CM | POA: Diagnosis not present

## 2022-06-22 DIAGNOSIS — K219 Gastro-esophageal reflux disease without esophagitis: Secondary | ICD-10-CM | POA: Diagnosis not present

## 2022-06-22 DIAGNOSIS — K449 Diaphragmatic hernia without obstruction or gangrene: Secondary | ICD-10-CM | POA: Diagnosis not present

## 2022-06-22 DIAGNOSIS — Z515 Encounter for palliative care: Secondary | ICD-10-CM | POA: Diagnosis not present

## 2022-06-22 DIAGNOSIS — Z8701 Personal history of pneumonia (recurrent): Secondary | ICD-10-CM | POA: Diagnosis not present

## 2022-06-22 DIAGNOSIS — I130 Hypertensive heart and chronic kidney disease with heart failure and stage 1 through stage 4 chronic kidney disease, or unspecified chronic kidney disease: Secondary | ICD-10-CM | POA: Diagnosis not present

## 2022-06-22 DIAGNOSIS — I5023 Acute on chronic systolic (congestive) heart failure: Secondary | ICD-10-CM | POA: Diagnosis not present

## 2022-06-24 DIAGNOSIS — D631 Anemia in chronic kidney disease: Secondary | ICD-10-CM | POA: Diagnosis not present

## 2022-06-24 DIAGNOSIS — E1122 Type 2 diabetes mellitus with diabetic chronic kidney disease: Secondary | ICD-10-CM | POA: Diagnosis not present

## 2022-06-24 DIAGNOSIS — N184 Chronic kidney disease, stage 4 (severe): Secondary | ICD-10-CM | POA: Diagnosis not present

## 2022-06-24 DIAGNOSIS — I130 Hypertensive heart and chronic kidney disease with heart failure and stage 1 through stage 4 chronic kidney disease, or unspecified chronic kidney disease: Secondary | ICD-10-CM | POA: Diagnosis not present

## 2022-06-24 DIAGNOSIS — I5023 Acute on chronic systolic (congestive) heart failure: Secondary | ICD-10-CM | POA: Diagnosis not present

## 2022-06-24 DIAGNOSIS — F419 Anxiety disorder, unspecified: Secondary | ICD-10-CM | POA: Diagnosis not present

## 2022-06-26 DIAGNOSIS — E1122 Type 2 diabetes mellitus with diabetic chronic kidney disease: Secondary | ICD-10-CM | POA: Diagnosis not present

## 2022-06-26 DIAGNOSIS — F419 Anxiety disorder, unspecified: Secondary | ICD-10-CM | POA: Diagnosis not present

## 2022-06-26 DIAGNOSIS — D631 Anemia in chronic kidney disease: Secondary | ICD-10-CM | POA: Diagnosis not present

## 2022-06-26 DIAGNOSIS — I5023 Acute on chronic systolic (congestive) heart failure: Secondary | ICD-10-CM | POA: Diagnosis not present

## 2022-06-26 DIAGNOSIS — I130 Hypertensive heart and chronic kidney disease with heart failure and stage 1 through stage 4 chronic kidney disease, or unspecified chronic kidney disease: Secondary | ICD-10-CM | POA: Diagnosis not present

## 2022-06-26 DIAGNOSIS — N184 Chronic kidney disease, stage 4 (severe): Secondary | ICD-10-CM | POA: Diagnosis not present

## 2022-06-27 DIAGNOSIS — D631 Anemia in chronic kidney disease: Secondary | ICD-10-CM | POA: Diagnosis not present

## 2022-06-27 DIAGNOSIS — F419 Anxiety disorder, unspecified: Secondary | ICD-10-CM | POA: Diagnosis not present

## 2022-06-27 DIAGNOSIS — I5023 Acute on chronic systolic (congestive) heart failure: Secondary | ICD-10-CM | POA: Diagnosis not present

## 2022-06-27 DIAGNOSIS — I130 Hypertensive heart and chronic kidney disease with heart failure and stage 1 through stage 4 chronic kidney disease, or unspecified chronic kidney disease: Secondary | ICD-10-CM | POA: Diagnosis not present

## 2022-06-27 DIAGNOSIS — N184 Chronic kidney disease, stage 4 (severe): Secondary | ICD-10-CM | POA: Diagnosis not present

## 2022-06-27 DIAGNOSIS — E1122 Type 2 diabetes mellitus with diabetic chronic kidney disease: Secondary | ICD-10-CM | POA: Diagnosis not present

## 2022-06-28 DIAGNOSIS — F419 Anxiety disorder, unspecified: Secondary | ICD-10-CM | POA: Diagnosis not present

## 2022-06-28 DIAGNOSIS — I5023 Acute on chronic systolic (congestive) heart failure: Secondary | ICD-10-CM | POA: Diagnosis not present

## 2022-06-28 DIAGNOSIS — D631 Anemia in chronic kidney disease: Secondary | ICD-10-CM | POA: Diagnosis not present

## 2022-06-28 DIAGNOSIS — E1122 Type 2 diabetes mellitus with diabetic chronic kidney disease: Secondary | ICD-10-CM | POA: Diagnosis not present

## 2022-06-28 DIAGNOSIS — I130 Hypertensive heart and chronic kidney disease with heart failure and stage 1 through stage 4 chronic kidney disease, or unspecified chronic kidney disease: Secondary | ICD-10-CM | POA: Diagnosis not present

## 2022-06-28 DIAGNOSIS — N184 Chronic kidney disease, stage 4 (severe): Secondary | ICD-10-CM | POA: Diagnosis not present

## 2022-07-01 ENCOUNTER — Other Ambulatory Visit: Payer: Self-pay | Admitting: Family Medicine

## 2022-07-01 DIAGNOSIS — I130 Hypertensive heart and chronic kidney disease with heart failure and stage 1 through stage 4 chronic kidney disease, or unspecified chronic kidney disease: Secondary | ICD-10-CM | POA: Diagnosis not present

## 2022-07-01 DIAGNOSIS — N184 Chronic kidney disease, stage 4 (severe): Secondary | ICD-10-CM | POA: Diagnosis not present

## 2022-07-01 DIAGNOSIS — E1122 Type 2 diabetes mellitus with diabetic chronic kidney disease: Secondary | ICD-10-CM | POA: Diagnosis not present

## 2022-07-01 DIAGNOSIS — I5023 Acute on chronic systolic (congestive) heart failure: Secondary | ICD-10-CM | POA: Diagnosis not present

## 2022-07-01 DIAGNOSIS — D631 Anemia in chronic kidney disease: Secondary | ICD-10-CM | POA: Diagnosis not present

## 2022-07-01 DIAGNOSIS — F419 Anxiety disorder, unspecified: Secondary | ICD-10-CM | POA: Diagnosis not present

## 2022-07-03 DIAGNOSIS — E1122 Type 2 diabetes mellitus with diabetic chronic kidney disease: Secondary | ICD-10-CM | POA: Diagnosis not present

## 2022-07-03 DIAGNOSIS — N184 Chronic kidney disease, stage 4 (severe): Secondary | ICD-10-CM | POA: Diagnosis not present

## 2022-07-03 DIAGNOSIS — F419 Anxiety disorder, unspecified: Secondary | ICD-10-CM | POA: Diagnosis not present

## 2022-07-03 DIAGNOSIS — I130 Hypertensive heart and chronic kidney disease with heart failure and stage 1 through stage 4 chronic kidney disease, or unspecified chronic kidney disease: Secondary | ICD-10-CM | POA: Diagnosis not present

## 2022-07-03 DIAGNOSIS — D631 Anemia in chronic kidney disease: Secondary | ICD-10-CM | POA: Diagnosis not present

## 2022-07-03 DIAGNOSIS — I5023 Acute on chronic systolic (congestive) heart failure: Secondary | ICD-10-CM | POA: Diagnosis not present

## 2022-07-04 DIAGNOSIS — D631 Anemia in chronic kidney disease: Secondary | ICD-10-CM | POA: Diagnosis not present

## 2022-07-04 DIAGNOSIS — E1122 Type 2 diabetes mellitus with diabetic chronic kidney disease: Secondary | ICD-10-CM | POA: Diagnosis not present

## 2022-07-04 DIAGNOSIS — N184 Chronic kidney disease, stage 4 (severe): Secondary | ICD-10-CM | POA: Diagnosis not present

## 2022-07-04 DIAGNOSIS — I130 Hypertensive heart and chronic kidney disease with heart failure and stage 1 through stage 4 chronic kidney disease, or unspecified chronic kidney disease: Secondary | ICD-10-CM | POA: Diagnosis not present

## 2022-07-04 DIAGNOSIS — I5023 Acute on chronic systolic (congestive) heart failure: Secondary | ICD-10-CM | POA: Diagnosis not present

## 2022-07-04 DIAGNOSIS — F419 Anxiety disorder, unspecified: Secondary | ICD-10-CM | POA: Diagnosis not present

## 2022-07-05 DIAGNOSIS — N184 Chronic kidney disease, stage 4 (severe): Secondary | ICD-10-CM | POA: Diagnosis not present

## 2022-07-05 DIAGNOSIS — I130 Hypertensive heart and chronic kidney disease with heart failure and stage 1 through stage 4 chronic kidney disease, or unspecified chronic kidney disease: Secondary | ICD-10-CM | POA: Diagnosis not present

## 2022-07-05 DIAGNOSIS — D631 Anemia in chronic kidney disease: Secondary | ICD-10-CM | POA: Diagnosis not present

## 2022-07-05 DIAGNOSIS — E1122 Type 2 diabetes mellitus with diabetic chronic kidney disease: Secondary | ICD-10-CM | POA: Diagnosis not present

## 2022-07-05 DIAGNOSIS — I5023 Acute on chronic systolic (congestive) heart failure: Secondary | ICD-10-CM | POA: Diagnosis not present

## 2022-07-05 DIAGNOSIS — F419 Anxiety disorder, unspecified: Secondary | ICD-10-CM | POA: Diagnosis not present

## 2022-07-08 DIAGNOSIS — F419 Anxiety disorder, unspecified: Secondary | ICD-10-CM | POA: Diagnosis not present

## 2022-07-08 DIAGNOSIS — N184 Chronic kidney disease, stage 4 (severe): Secondary | ICD-10-CM | POA: Diagnosis not present

## 2022-07-08 DIAGNOSIS — D631 Anemia in chronic kidney disease: Secondary | ICD-10-CM | POA: Diagnosis not present

## 2022-07-08 DIAGNOSIS — I5023 Acute on chronic systolic (congestive) heart failure: Secondary | ICD-10-CM | POA: Diagnosis not present

## 2022-07-08 DIAGNOSIS — I130 Hypertensive heart and chronic kidney disease with heart failure and stage 1 through stage 4 chronic kidney disease, or unspecified chronic kidney disease: Secondary | ICD-10-CM | POA: Diagnosis not present

## 2022-07-08 DIAGNOSIS — E1122 Type 2 diabetes mellitus with diabetic chronic kidney disease: Secondary | ICD-10-CM | POA: Diagnosis not present

## 2022-07-09 DIAGNOSIS — F419 Anxiety disorder, unspecified: Secondary | ICD-10-CM | POA: Diagnosis not present

## 2022-07-09 DIAGNOSIS — N184 Chronic kidney disease, stage 4 (severe): Secondary | ICD-10-CM | POA: Diagnosis not present

## 2022-07-09 DIAGNOSIS — I5023 Acute on chronic systolic (congestive) heart failure: Secondary | ICD-10-CM | POA: Diagnosis not present

## 2022-07-09 DIAGNOSIS — E1122 Type 2 diabetes mellitus with diabetic chronic kidney disease: Secondary | ICD-10-CM | POA: Diagnosis not present

## 2022-07-09 DIAGNOSIS — I130 Hypertensive heart and chronic kidney disease with heart failure and stage 1 through stage 4 chronic kidney disease, or unspecified chronic kidney disease: Secondary | ICD-10-CM | POA: Diagnosis not present

## 2022-07-09 DIAGNOSIS — D631 Anemia in chronic kidney disease: Secondary | ICD-10-CM | POA: Diagnosis not present

## 2022-07-10 ENCOUNTER — Other Ambulatory Visit: Payer: Self-pay | Admitting: Family Medicine

## 2022-07-10 DIAGNOSIS — N184 Chronic kidney disease, stage 4 (severe): Secondary | ICD-10-CM | POA: Diagnosis not present

## 2022-07-10 DIAGNOSIS — E1122 Type 2 diabetes mellitus with diabetic chronic kidney disease: Secondary | ICD-10-CM | POA: Diagnosis not present

## 2022-07-10 DIAGNOSIS — F419 Anxiety disorder, unspecified: Secondary | ICD-10-CM | POA: Diagnosis not present

## 2022-07-10 DIAGNOSIS — I130 Hypertensive heart and chronic kidney disease with heart failure and stage 1 through stage 4 chronic kidney disease, or unspecified chronic kidney disease: Secondary | ICD-10-CM | POA: Diagnosis not present

## 2022-07-10 DIAGNOSIS — D631 Anemia in chronic kidney disease: Secondary | ICD-10-CM | POA: Diagnosis not present

## 2022-07-10 DIAGNOSIS — I5023 Acute on chronic systolic (congestive) heart failure: Secondary | ICD-10-CM | POA: Diagnosis not present

## 2022-07-12 DIAGNOSIS — N184 Chronic kidney disease, stage 4 (severe): Secondary | ICD-10-CM | POA: Diagnosis not present

## 2022-07-12 DIAGNOSIS — D631 Anemia in chronic kidney disease: Secondary | ICD-10-CM | POA: Diagnosis not present

## 2022-07-12 DIAGNOSIS — E1122 Type 2 diabetes mellitus with diabetic chronic kidney disease: Secondary | ICD-10-CM | POA: Diagnosis not present

## 2022-07-12 DIAGNOSIS — I5023 Acute on chronic systolic (congestive) heart failure: Secondary | ICD-10-CM | POA: Diagnosis not present

## 2022-07-12 DIAGNOSIS — F419 Anxiety disorder, unspecified: Secondary | ICD-10-CM | POA: Diagnosis not present

## 2022-07-12 DIAGNOSIS — I130 Hypertensive heart and chronic kidney disease with heart failure and stage 1 through stage 4 chronic kidney disease, or unspecified chronic kidney disease: Secondary | ICD-10-CM | POA: Diagnosis not present

## 2022-07-14 DIAGNOSIS — G311 Senile degeneration of brain, not elsewhere classified: Secondary | ICD-10-CM | POA: Diagnosis not present

## 2022-07-14 DIAGNOSIS — Z8701 Personal history of pneumonia (recurrent): Secondary | ICD-10-CM | POA: Diagnosis not present

## 2022-07-14 DIAGNOSIS — K219 Gastro-esophageal reflux disease without esophagitis: Secondary | ICD-10-CM | POA: Diagnosis not present

## 2022-07-14 DIAGNOSIS — F419 Anxiety disorder, unspecified: Secondary | ICD-10-CM | POA: Diagnosis not present

## 2022-07-14 DIAGNOSIS — I480 Paroxysmal atrial fibrillation: Secondary | ICD-10-CM | POA: Diagnosis not present

## 2022-07-14 DIAGNOSIS — Z515 Encounter for palliative care: Secondary | ICD-10-CM | POA: Diagnosis not present

## 2022-07-14 DIAGNOSIS — Z8719 Personal history of other diseases of the digestive system: Secondary | ICD-10-CM | POA: Diagnosis not present

## 2022-07-14 DIAGNOSIS — I429 Cardiomyopathy, unspecified: Secondary | ICD-10-CM | POA: Diagnosis not present

## 2022-07-14 DIAGNOSIS — N184 Chronic kidney disease, stage 4 (severe): Secondary | ICD-10-CM | POA: Diagnosis not present

## 2022-07-14 DIAGNOSIS — K449 Diaphragmatic hernia without obstruction or gangrene: Secondary | ICD-10-CM | POA: Diagnosis not present

## 2022-07-14 DIAGNOSIS — E1122 Type 2 diabetes mellitus with diabetic chronic kidney disease: Secondary | ICD-10-CM | POA: Diagnosis not present

## 2022-07-14 DIAGNOSIS — I130 Hypertensive heart and chronic kidney disease with heart failure and stage 1 through stage 4 chronic kidney disease, or unspecified chronic kidney disease: Secondary | ICD-10-CM | POA: Diagnosis not present

## 2022-07-14 DIAGNOSIS — D631 Anemia in chronic kidney disease: Secondary | ICD-10-CM | POA: Diagnosis not present

## 2022-07-14 DIAGNOSIS — F0280 Dementia in other diseases classified elsewhere without behavioral disturbance: Secondary | ICD-10-CM | POA: Diagnosis not present

## 2022-07-14 DIAGNOSIS — I5023 Acute on chronic systolic (congestive) heart failure: Secondary | ICD-10-CM | POA: Diagnosis not present

## 2022-07-14 DIAGNOSIS — E785 Hyperlipidemia, unspecified: Secondary | ICD-10-CM | POA: Diagnosis not present

## 2022-07-14 DIAGNOSIS — G2581 Restless legs syndrome: Secondary | ICD-10-CM | POA: Diagnosis not present

## 2022-07-14 DIAGNOSIS — Z9981 Dependence on supplemental oxygen: Secondary | ICD-10-CM | POA: Diagnosis not present

## 2022-07-15 DIAGNOSIS — N184 Chronic kidney disease, stage 4 (severe): Secondary | ICD-10-CM | POA: Diagnosis not present

## 2022-07-15 DIAGNOSIS — I5023 Acute on chronic systolic (congestive) heart failure: Secondary | ICD-10-CM | POA: Diagnosis not present

## 2022-07-15 DIAGNOSIS — F419 Anxiety disorder, unspecified: Secondary | ICD-10-CM | POA: Diagnosis not present

## 2022-07-15 DIAGNOSIS — D631 Anemia in chronic kidney disease: Secondary | ICD-10-CM | POA: Diagnosis not present

## 2022-07-15 DIAGNOSIS — I130 Hypertensive heart and chronic kidney disease with heart failure and stage 1 through stage 4 chronic kidney disease, or unspecified chronic kidney disease: Secondary | ICD-10-CM | POA: Diagnosis not present

## 2022-07-15 DIAGNOSIS — E1122 Type 2 diabetes mellitus with diabetic chronic kidney disease: Secondary | ICD-10-CM | POA: Diagnosis not present

## 2022-07-17 DIAGNOSIS — F419 Anxiety disorder, unspecified: Secondary | ICD-10-CM | POA: Diagnosis not present

## 2022-07-17 DIAGNOSIS — E1122 Type 2 diabetes mellitus with diabetic chronic kidney disease: Secondary | ICD-10-CM | POA: Diagnosis not present

## 2022-07-17 DIAGNOSIS — I5023 Acute on chronic systolic (congestive) heart failure: Secondary | ICD-10-CM | POA: Diagnosis not present

## 2022-07-17 DIAGNOSIS — D631 Anemia in chronic kidney disease: Secondary | ICD-10-CM | POA: Diagnosis not present

## 2022-07-17 DIAGNOSIS — I130 Hypertensive heart and chronic kidney disease with heart failure and stage 1 through stage 4 chronic kidney disease, or unspecified chronic kidney disease: Secondary | ICD-10-CM | POA: Diagnosis not present

## 2022-07-17 DIAGNOSIS — N184 Chronic kidney disease, stage 4 (severe): Secondary | ICD-10-CM | POA: Diagnosis not present

## 2022-07-19 DIAGNOSIS — N184 Chronic kidney disease, stage 4 (severe): Secondary | ICD-10-CM | POA: Diagnosis not present

## 2022-07-19 DIAGNOSIS — E1122 Type 2 diabetes mellitus with diabetic chronic kidney disease: Secondary | ICD-10-CM | POA: Diagnosis not present

## 2022-07-19 DIAGNOSIS — I130 Hypertensive heart and chronic kidney disease with heart failure and stage 1 through stage 4 chronic kidney disease, or unspecified chronic kidney disease: Secondary | ICD-10-CM | POA: Diagnosis not present

## 2022-07-19 DIAGNOSIS — D631 Anemia in chronic kidney disease: Secondary | ICD-10-CM | POA: Diagnosis not present

## 2022-07-19 DIAGNOSIS — F419 Anxiety disorder, unspecified: Secondary | ICD-10-CM | POA: Diagnosis not present

## 2022-07-19 DIAGNOSIS — I5023 Acute on chronic systolic (congestive) heart failure: Secondary | ICD-10-CM | POA: Diagnosis not present

## 2022-07-22 DIAGNOSIS — I130 Hypertensive heart and chronic kidney disease with heart failure and stage 1 through stage 4 chronic kidney disease, or unspecified chronic kidney disease: Secondary | ICD-10-CM | POA: Diagnosis not present

## 2022-07-22 DIAGNOSIS — I5023 Acute on chronic systolic (congestive) heart failure: Secondary | ICD-10-CM | POA: Diagnosis not present

## 2022-07-22 DIAGNOSIS — D631 Anemia in chronic kidney disease: Secondary | ICD-10-CM | POA: Diagnosis not present

## 2022-07-22 DIAGNOSIS — N184 Chronic kidney disease, stage 4 (severe): Secondary | ICD-10-CM | POA: Diagnosis not present

## 2022-07-22 DIAGNOSIS — E1122 Type 2 diabetes mellitus with diabetic chronic kidney disease: Secondary | ICD-10-CM | POA: Diagnosis not present

## 2022-07-22 DIAGNOSIS — F419 Anxiety disorder, unspecified: Secondary | ICD-10-CM | POA: Diagnosis not present

## 2022-07-24 DIAGNOSIS — I5023 Acute on chronic systolic (congestive) heart failure: Secondary | ICD-10-CM | POA: Diagnosis not present

## 2022-07-24 DIAGNOSIS — D631 Anemia in chronic kidney disease: Secondary | ICD-10-CM | POA: Diagnosis not present

## 2022-07-24 DIAGNOSIS — N184 Chronic kidney disease, stage 4 (severe): Secondary | ICD-10-CM | POA: Diagnosis not present

## 2022-07-24 DIAGNOSIS — E1122 Type 2 diabetes mellitus with diabetic chronic kidney disease: Secondary | ICD-10-CM | POA: Diagnosis not present

## 2022-07-24 DIAGNOSIS — F419 Anxiety disorder, unspecified: Secondary | ICD-10-CM | POA: Diagnosis not present

## 2022-07-24 DIAGNOSIS — I130 Hypertensive heart and chronic kidney disease with heart failure and stage 1 through stage 4 chronic kidney disease, or unspecified chronic kidney disease: Secondary | ICD-10-CM | POA: Diagnosis not present

## 2022-07-26 DIAGNOSIS — F419 Anxiety disorder, unspecified: Secondary | ICD-10-CM | POA: Diagnosis not present

## 2022-07-26 DIAGNOSIS — N184 Chronic kidney disease, stage 4 (severe): Secondary | ICD-10-CM | POA: Diagnosis not present

## 2022-07-26 DIAGNOSIS — E1122 Type 2 diabetes mellitus with diabetic chronic kidney disease: Secondary | ICD-10-CM | POA: Diagnosis not present

## 2022-07-26 DIAGNOSIS — D631 Anemia in chronic kidney disease: Secondary | ICD-10-CM | POA: Diagnosis not present

## 2022-07-26 DIAGNOSIS — I130 Hypertensive heart and chronic kidney disease with heart failure and stage 1 through stage 4 chronic kidney disease, or unspecified chronic kidney disease: Secondary | ICD-10-CM | POA: Diagnosis not present

## 2022-07-26 DIAGNOSIS — I5023 Acute on chronic systolic (congestive) heart failure: Secondary | ICD-10-CM | POA: Diagnosis not present

## 2022-07-29 DIAGNOSIS — D631 Anemia in chronic kidney disease: Secondary | ICD-10-CM | POA: Diagnosis not present

## 2022-07-29 DIAGNOSIS — F419 Anxiety disorder, unspecified: Secondary | ICD-10-CM | POA: Diagnosis not present

## 2022-07-29 DIAGNOSIS — E1122 Type 2 diabetes mellitus with diabetic chronic kidney disease: Secondary | ICD-10-CM | POA: Diagnosis not present

## 2022-07-29 DIAGNOSIS — N184 Chronic kidney disease, stage 4 (severe): Secondary | ICD-10-CM | POA: Diagnosis not present

## 2022-07-29 DIAGNOSIS — I130 Hypertensive heart and chronic kidney disease with heart failure and stage 1 through stage 4 chronic kidney disease, or unspecified chronic kidney disease: Secondary | ICD-10-CM | POA: Diagnosis not present

## 2022-07-29 DIAGNOSIS — I5023 Acute on chronic systolic (congestive) heart failure: Secondary | ICD-10-CM | POA: Diagnosis not present

## 2022-07-30 DIAGNOSIS — I130 Hypertensive heart and chronic kidney disease with heart failure and stage 1 through stage 4 chronic kidney disease, or unspecified chronic kidney disease: Secondary | ICD-10-CM | POA: Diagnosis not present

## 2022-07-30 DIAGNOSIS — E1122 Type 2 diabetes mellitus with diabetic chronic kidney disease: Secondary | ICD-10-CM | POA: Diagnosis not present

## 2022-07-30 DIAGNOSIS — I5023 Acute on chronic systolic (congestive) heart failure: Secondary | ICD-10-CM | POA: Diagnosis not present

## 2022-07-30 DIAGNOSIS — D631 Anemia in chronic kidney disease: Secondary | ICD-10-CM | POA: Diagnosis not present

## 2022-07-30 DIAGNOSIS — F419 Anxiety disorder, unspecified: Secondary | ICD-10-CM | POA: Diagnosis not present

## 2022-07-30 DIAGNOSIS — N184 Chronic kidney disease, stage 4 (severe): Secondary | ICD-10-CM | POA: Diagnosis not present

## 2022-07-31 DIAGNOSIS — I130 Hypertensive heart and chronic kidney disease with heart failure and stage 1 through stage 4 chronic kidney disease, or unspecified chronic kidney disease: Secondary | ICD-10-CM | POA: Diagnosis not present

## 2022-07-31 DIAGNOSIS — N184 Chronic kidney disease, stage 4 (severe): Secondary | ICD-10-CM | POA: Diagnosis not present

## 2022-07-31 DIAGNOSIS — E1122 Type 2 diabetes mellitus with diabetic chronic kidney disease: Secondary | ICD-10-CM | POA: Diagnosis not present

## 2022-07-31 DIAGNOSIS — I5023 Acute on chronic systolic (congestive) heart failure: Secondary | ICD-10-CM | POA: Diagnosis not present

## 2022-07-31 DIAGNOSIS — D631 Anemia in chronic kidney disease: Secondary | ICD-10-CM | POA: Diagnosis not present

## 2022-07-31 DIAGNOSIS — F419 Anxiety disorder, unspecified: Secondary | ICD-10-CM | POA: Diagnosis not present

## 2022-08-01 ENCOUNTER — Other Ambulatory Visit: Payer: Self-pay | Admitting: Family Medicine

## 2022-08-02 DIAGNOSIS — E1122 Type 2 diabetes mellitus with diabetic chronic kidney disease: Secondary | ICD-10-CM | POA: Diagnosis not present

## 2022-08-02 DIAGNOSIS — I130 Hypertensive heart and chronic kidney disease with heart failure and stage 1 through stage 4 chronic kidney disease, or unspecified chronic kidney disease: Secondary | ICD-10-CM | POA: Diagnosis not present

## 2022-08-02 DIAGNOSIS — D631 Anemia in chronic kidney disease: Secondary | ICD-10-CM | POA: Diagnosis not present

## 2022-08-02 DIAGNOSIS — N184 Chronic kidney disease, stage 4 (severe): Secondary | ICD-10-CM | POA: Diagnosis not present

## 2022-08-02 DIAGNOSIS — F419 Anxiety disorder, unspecified: Secondary | ICD-10-CM | POA: Diagnosis not present

## 2022-08-02 DIAGNOSIS — I5023 Acute on chronic systolic (congestive) heart failure: Secondary | ICD-10-CM | POA: Diagnosis not present

## 2022-08-05 DIAGNOSIS — I130 Hypertensive heart and chronic kidney disease with heart failure and stage 1 through stage 4 chronic kidney disease, or unspecified chronic kidney disease: Secondary | ICD-10-CM | POA: Diagnosis not present

## 2022-08-05 DIAGNOSIS — I5023 Acute on chronic systolic (congestive) heart failure: Secondary | ICD-10-CM | POA: Diagnosis not present

## 2022-08-05 DIAGNOSIS — D631 Anemia in chronic kidney disease: Secondary | ICD-10-CM | POA: Diagnosis not present

## 2022-08-05 DIAGNOSIS — E1122 Type 2 diabetes mellitus with diabetic chronic kidney disease: Secondary | ICD-10-CM | POA: Diagnosis not present

## 2022-08-05 DIAGNOSIS — F419 Anxiety disorder, unspecified: Secondary | ICD-10-CM | POA: Diagnosis not present

## 2022-08-05 DIAGNOSIS — N184 Chronic kidney disease, stage 4 (severe): Secondary | ICD-10-CM | POA: Diagnosis not present

## 2022-08-09 DIAGNOSIS — E1122 Type 2 diabetes mellitus with diabetic chronic kidney disease: Secondary | ICD-10-CM | POA: Diagnosis not present

## 2022-08-09 DIAGNOSIS — F419 Anxiety disorder, unspecified: Secondary | ICD-10-CM | POA: Diagnosis not present

## 2022-08-09 DIAGNOSIS — I130 Hypertensive heart and chronic kidney disease with heart failure and stage 1 through stage 4 chronic kidney disease, or unspecified chronic kidney disease: Secondary | ICD-10-CM | POA: Diagnosis not present

## 2022-08-09 DIAGNOSIS — I5023 Acute on chronic systolic (congestive) heart failure: Secondary | ICD-10-CM | POA: Diagnosis not present

## 2022-08-09 DIAGNOSIS — N184 Chronic kidney disease, stage 4 (severe): Secondary | ICD-10-CM | POA: Diagnosis not present

## 2022-08-09 DIAGNOSIS — D631 Anemia in chronic kidney disease: Secondary | ICD-10-CM | POA: Diagnosis not present

## 2022-08-11 DIAGNOSIS — E1122 Type 2 diabetes mellitus with diabetic chronic kidney disease: Secondary | ICD-10-CM | POA: Diagnosis not present

## 2022-08-11 DIAGNOSIS — D631 Anemia in chronic kidney disease: Secondary | ICD-10-CM | POA: Diagnosis not present

## 2022-08-11 DIAGNOSIS — F419 Anxiety disorder, unspecified: Secondary | ICD-10-CM | POA: Diagnosis not present

## 2022-08-11 DIAGNOSIS — N184 Chronic kidney disease, stage 4 (severe): Secondary | ICD-10-CM | POA: Diagnosis not present

## 2022-08-11 DIAGNOSIS — I130 Hypertensive heart and chronic kidney disease with heart failure and stage 1 through stage 4 chronic kidney disease, or unspecified chronic kidney disease: Secondary | ICD-10-CM | POA: Diagnosis not present

## 2022-08-11 DIAGNOSIS — I5023 Acute on chronic systolic (congestive) heart failure: Secondary | ICD-10-CM | POA: Diagnosis not present

## 2022-08-12 DIAGNOSIS — I130 Hypertensive heart and chronic kidney disease with heart failure and stage 1 through stage 4 chronic kidney disease, or unspecified chronic kidney disease: Secondary | ICD-10-CM | POA: Diagnosis not present

## 2022-08-12 DIAGNOSIS — I5023 Acute on chronic systolic (congestive) heart failure: Secondary | ICD-10-CM | POA: Diagnosis not present

## 2022-08-12 DIAGNOSIS — D631 Anemia in chronic kidney disease: Secondary | ICD-10-CM | POA: Diagnosis not present

## 2022-08-12 DIAGNOSIS — E1122 Type 2 diabetes mellitus with diabetic chronic kidney disease: Secondary | ICD-10-CM | POA: Diagnosis not present

## 2022-08-12 DIAGNOSIS — F419 Anxiety disorder, unspecified: Secondary | ICD-10-CM | POA: Diagnosis not present

## 2022-08-12 DIAGNOSIS — N184 Chronic kidney disease, stage 4 (severe): Secondary | ICD-10-CM | POA: Diagnosis not present

## 2022-08-13 ENCOUNTER — Inpatient Hospital Stay (HOSPITAL_COMMUNITY): Payer: Medicare Other

## 2022-08-13 ENCOUNTER — Emergency Department (HOSPITAL_COMMUNITY): Payer: Medicare Other

## 2022-08-13 ENCOUNTER — Other Ambulatory Visit: Payer: Self-pay

## 2022-08-13 ENCOUNTER — Inpatient Hospital Stay (HOSPITAL_COMMUNITY)
Admission: EM | Admit: 2022-08-13 | Discharge: 2022-08-27 | DRG: 308 | Disposition: A | Discharge status: Hospice | Payer: Medicare Other | Attending: Internal Medicine | Admitting: Internal Medicine

## 2022-08-13 DIAGNOSIS — J9621 Acute and chronic respiratory failure with hypoxia: Secondary | ICD-10-CM | POA: Diagnosis not present

## 2022-08-13 DIAGNOSIS — E039 Hypothyroidism, unspecified: Secondary | ICD-10-CM | POA: Diagnosis present

## 2022-08-13 DIAGNOSIS — R0602 Shortness of breath: Secondary | ICD-10-CM | POA: Diagnosis not present

## 2022-08-13 DIAGNOSIS — D649 Anemia, unspecified: Secondary | ICD-10-CM | POA: Diagnosis not present

## 2022-08-13 DIAGNOSIS — I5022 Chronic systolic (congestive) heart failure: Secondary | ICD-10-CM | POA: Diagnosis not present

## 2022-08-13 DIAGNOSIS — R739 Hyperglycemia, unspecified: Secondary | ICD-10-CM | POA: Diagnosis not present

## 2022-08-13 DIAGNOSIS — I340 Nonrheumatic mitral (valve) insufficiency: Secondary | ICD-10-CM | POA: Diagnosis present

## 2022-08-13 DIAGNOSIS — R0902 Hypoxemia: Secondary | ICD-10-CM | POA: Diagnosis not present

## 2022-08-13 DIAGNOSIS — Z809 Family history of malignant neoplasm, unspecified: Secondary | ICD-10-CM

## 2022-08-13 DIAGNOSIS — I4891 Unspecified atrial fibrillation: Secondary | ICD-10-CM

## 2022-08-13 DIAGNOSIS — I428 Other cardiomyopathies: Secondary | ICD-10-CM | POA: Diagnosis present

## 2022-08-13 DIAGNOSIS — G2581 Restless legs syndrome: Secondary | ICD-10-CM | POA: Diagnosis present

## 2022-08-13 DIAGNOSIS — E872 Acidosis, unspecified: Secondary | ICD-10-CM | POA: Diagnosis present

## 2022-08-13 DIAGNOSIS — F0150 Vascular dementia without behavioral disturbance: Secondary | ICD-10-CM | POA: Diagnosis not present

## 2022-08-13 DIAGNOSIS — F05 Delirium due to known physiological condition: Secondary | ICD-10-CM | POA: Diagnosis not present

## 2022-08-13 DIAGNOSIS — E1122 Type 2 diabetes mellitus with diabetic chronic kidney disease: Secondary | ICD-10-CM | POA: Diagnosis present

## 2022-08-13 DIAGNOSIS — Z794 Long term (current) use of insulin: Secondary | ICD-10-CM

## 2022-08-13 DIAGNOSIS — I509 Heart failure, unspecified: Secondary | ICD-10-CM | POA: Diagnosis not present

## 2022-08-13 DIAGNOSIS — E785 Hyperlipidemia, unspecified: Secondary | ICD-10-CM | POA: Diagnosis not present

## 2022-08-13 DIAGNOSIS — I5084 End stage heart failure: Secondary | ICD-10-CM | POA: Diagnosis present

## 2022-08-13 DIAGNOSIS — A419 Sepsis, unspecified organism: Secondary | ICD-10-CM | POA: Diagnosis not present

## 2022-08-13 DIAGNOSIS — J690 Pneumonitis due to inhalation of food and vomit: Secondary | ICD-10-CM | POA: Diagnosis not present

## 2022-08-13 DIAGNOSIS — Z789 Other specified health status: Secondary | ICD-10-CM | POA: Diagnosis not present

## 2022-08-13 DIAGNOSIS — Z1152 Encounter for screening for COVID-19: Secondary | ICD-10-CM | POA: Diagnosis not present

## 2022-08-13 DIAGNOSIS — I959 Hypotension, unspecified: Secondary | ICD-10-CM | POA: Diagnosis present

## 2022-08-13 DIAGNOSIS — F0280 Dementia in other diseases classified elsewhere without behavioral disturbance: Secondary | ICD-10-CM | POA: Diagnosis present

## 2022-08-13 DIAGNOSIS — I5023 Acute on chronic systolic (congestive) heart failure: Secondary | ICD-10-CM | POA: Diagnosis not present

## 2022-08-13 DIAGNOSIS — R1310 Dysphagia, unspecified: Secondary | ICD-10-CM | POA: Diagnosis not present

## 2022-08-13 DIAGNOSIS — Z79899 Other long term (current) drug therapy: Secondary | ICD-10-CM

## 2022-08-13 DIAGNOSIS — R051 Acute cough: Secondary | ICD-10-CM

## 2022-08-13 DIAGNOSIS — R4182 Altered mental status, unspecified: Secondary | ICD-10-CM | POA: Diagnosis not present

## 2022-08-13 DIAGNOSIS — Z823 Family history of stroke: Secondary | ICD-10-CM

## 2022-08-13 DIAGNOSIS — N179 Acute kidney failure, unspecified: Secondary | ICD-10-CM | POA: Diagnosis present

## 2022-08-13 DIAGNOSIS — K5900 Constipation, unspecified: Secondary | ICD-10-CM | POA: Diagnosis not present

## 2022-08-13 DIAGNOSIS — Z87891 Personal history of nicotine dependence: Secondary | ICD-10-CM

## 2022-08-13 DIAGNOSIS — Z8249 Family history of ischemic heart disease and other diseases of the circulatory system: Secondary | ICD-10-CM

## 2022-08-13 DIAGNOSIS — D631 Anemia in chronic kidney disease: Secondary | ICD-10-CM | POA: Diagnosis not present

## 2022-08-13 DIAGNOSIS — Z79890 Hormone replacement therapy (postmenopausal): Secondary | ICD-10-CM

## 2022-08-13 DIAGNOSIS — E1142 Type 2 diabetes mellitus with diabetic polyneuropathy: Secondary | ICD-10-CM | POA: Diagnosis present

## 2022-08-13 DIAGNOSIS — E1165 Type 2 diabetes mellitus with hyperglycemia: Secondary | ICD-10-CM | POA: Diagnosis present

## 2022-08-13 DIAGNOSIS — E871 Hypo-osmolality and hyponatremia: Secondary | ICD-10-CM | POA: Diagnosis not present

## 2022-08-13 DIAGNOSIS — I130 Hypertensive heart and chronic kidney disease with heart failure and stage 1 through stage 4 chronic kidney disease, or unspecified chronic kidney disease: Secondary | ICD-10-CM | POA: Diagnosis present

## 2022-08-13 DIAGNOSIS — Z8673 Personal history of transient ischemic attack (TIA), and cerebral infarction without residual deficits: Secondary | ICD-10-CM

## 2022-08-13 DIAGNOSIS — Z66 Do not resuscitate: Secondary | ICD-10-CM | POA: Diagnosis not present

## 2022-08-13 DIAGNOSIS — F419 Anxiety disorder, unspecified: Secondary | ICD-10-CM | POA: Diagnosis not present

## 2022-08-13 DIAGNOSIS — R5383 Other fatigue: Secondary | ICD-10-CM

## 2022-08-13 DIAGNOSIS — I5020 Unspecified systolic (congestive) heart failure: Secondary | ICD-10-CM | POA: Diagnosis not present

## 2022-08-13 DIAGNOSIS — Z515 Encounter for palliative care: Secondary | ICD-10-CM | POA: Diagnosis not present

## 2022-08-13 DIAGNOSIS — Z743 Need for continuous supervision: Secondary | ICD-10-CM | POA: Diagnosis not present

## 2022-08-13 DIAGNOSIS — Z7401 Bed confinement status: Secondary | ICD-10-CM | POA: Diagnosis not present

## 2022-08-13 DIAGNOSIS — I5043 Acute on chronic combined systolic (congestive) and diastolic (congestive) heart failure: Secondary | ICD-10-CM | POA: Diagnosis not present

## 2022-08-13 DIAGNOSIS — R0600 Dyspnea, unspecified: Secondary | ICD-10-CM | POA: Diagnosis not present

## 2022-08-13 DIAGNOSIS — N1832 Chronic kidney disease, stage 3b: Secondary | ICD-10-CM | POA: Diagnosis present

## 2022-08-13 DIAGNOSIS — R531 Weakness: Secondary | ICD-10-CM | POA: Diagnosis not present

## 2022-08-13 DIAGNOSIS — R339 Retention of urine, unspecified: Secondary | ICD-10-CM | POA: Diagnosis not present

## 2022-08-13 DIAGNOSIS — J159 Unspecified bacterial pneumonia: Secondary | ICD-10-CM | POA: Diagnosis not present

## 2022-08-13 DIAGNOSIS — I499 Cardiac arrhythmia, unspecified: Secondary | ICD-10-CM | POA: Diagnosis not present

## 2022-08-13 DIAGNOSIS — I480 Paroxysmal atrial fibrillation: Secondary | ICD-10-CM | POA: Diagnosis present

## 2022-08-13 DIAGNOSIS — R109 Unspecified abdominal pain: Secondary | ICD-10-CM | POA: Diagnosis not present

## 2022-08-13 DIAGNOSIS — Z7901 Long term (current) use of anticoagulants: Secondary | ICD-10-CM

## 2022-08-13 DIAGNOSIS — E1140 Type 2 diabetes mellitus with diabetic neuropathy, unspecified: Secondary | ICD-10-CM

## 2022-08-13 DIAGNOSIS — R079 Chest pain, unspecified: Secondary | ICD-10-CM | POA: Diagnosis not present

## 2022-08-13 DIAGNOSIS — R059 Cough, unspecified: Secondary | ICD-10-CM | POA: Diagnosis not present

## 2022-08-13 DIAGNOSIS — N184 Chronic kidney disease, stage 4 (severe): Secondary | ICD-10-CM | POA: Diagnosis not present

## 2022-08-13 DIAGNOSIS — Z8701 Personal history of pneumonia (recurrent): Secondary | ICD-10-CM

## 2022-08-13 DIAGNOSIS — Z7189 Other specified counseling: Secondary | ICD-10-CM | POA: Diagnosis not present

## 2022-08-13 DIAGNOSIS — E861 Hypovolemia: Secondary | ICD-10-CM | POA: Diagnosis present

## 2022-08-13 DIAGNOSIS — E7800 Pure hypercholesterolemia, unspecified: Secondary | ICD-10-CM | POA: Diagnosis present

## 2022-08-13 DIAGNOSIS — K219 Gastro-esophageal reflux disease without esophagitis: Secondary | ICD-10-CM | POA: Diagnosis present

## 2022-08-13 LAB — MAGNESIUM: Magnesium: 1.2 mg/dL — ABNORMAL LOW (ref 1.7–2.4)

## 2022-08-13 LAB — URINALYSIS, ROUTINE W REFLEX MICROSCOPIC
Bilirubin Urine: NEGATIVE
Glucose, UA: NEGATIVE mg/dL
Hgb urine dipstick: NEGATIVE
Ketones, ur: NEGATIVE mg/dL
Nitrite: NEGATIVE
Protein, ur: NEGATIVE mg/dL
Specific Gravity, Urine: 1.016 (ref 1.005–1.030)
pH: 5 (ref 5.0–8.0)

## 2022-08-13 LAB — COMPREHENSIVE METABOLIC PANEL
ALT: 8 U/L (ref 0–44)
AST: 12 U/L — ABNORMAL LOW (ref 15–41)
Albumin: 3.2 g/dL — ABNORMAL LOW (ref 3.5–5.0)
Alkaline Phosphatase: 68 U/L (ref 38–126)
Anion gap: 13 (ref 5–15)
BUN: 37 mg/dL — ABNORMAL HIGH (ref 8–23)
CO2: 22 mmol/L (ref 22–32)
Calcium: 8.5 mg/dL — ABNORMAL LOW (ref 8.9–10.3)
Chloride: 105 mmol/L (ref 98–111)
Creatinine, Ser: 2.42 mg/dL — ABNORMAL HIGH (ref 0.44–1.00)
GFR, Estimated: 18 mL/min — ABNORMAL LOW (ref >60)
Glucose, Bld: 175 mg/dL — ABNORMAL HIGH (ref 70–99)
Potassium: 3.9 mmol/L (ref 3.5–5.1)
Sodium: 140 mmol/L (ref 135–145)
Total Bilirubin: 0.4 mg/dL (ref 0.3–1.2)
Total Protein: 6.7 g/dL (ref 6.5–8.1)

## 2022-08-13 LAB — CBC WITH DIFFERENTIAL/PLATELET
Abs Immature Granulocytes: 0.04 10*3/uL (ref 0.00–0.07)
Basophils Absolute: 0.1 10*3/uL (ref 0.0–0.1)
Basophils Relative: 1 %
Eosinophils Absolute: 0.2 10*3/uL (ref 0.0–0.5)
Eosinophils Relative: 3 %
HCT: 31.9 % — ABNORMAL LOW (ref 36.0–46.0)
Hemoglobin: 10.1 g/dL — ABNORMAL LOW (ref 12.0–15.0)
Immature Granulocytes: 1 %
Lymphocytes Relative: 22 %
Lymphs Abs: 1.5 10*3/uL (ref 0.7–4.0)
MCH: 27.9 pg (ref 26.0–34.0)
MCHC: 31.7 g/dL (ref 30.0–36.0)
MCV: 88.1 fL (ref 80.0–100.0)
Monocytes Absolute: 0.5 10*3/uL (ref 0.1–1.0)
Monocytes Relative: 8 %
Neutro Abs: 4.6 10*3/uL (ref 1.7–7.7)
Neutrophils Relative %: 65 %
Platelets: 182 10*3/uL (ref 150–400)
RBC: 3.62 MIL/uL — ABNORMAL LOW (ref 3.87–5.11)
RDW: 17.3 % — ABNORMAL HIGH (ref 11.5–15.5)
WBC: 7 10*3/uL (ref 4.0–10.5)
nRBC: 0 % (ref 0.0–0.2)

## 2022-08-13 LAB — LACTIC ACID, PLASMA: Lactic Acid, Venous: 1.7 mmol/L (ref 0.5–1.9)

## 2022-08-13 LAB — MRSA NEXT GEN BY PCR, NASAL: MRSA by PCR Next Gen: NOT DETECTED

## 2022-08-13 LAB — PROTIME-INR
INR: 1.3 — ABNORMAL HIGH (ref 0.8–1.2)
Prothrombin Time: 16.2 seconds — ABNORMAL HIGH (ref 11.4–15.2)

## 2022-08-13 LAB — CREATININE, URINE, RANDOM: Creatinine, Urine: 119 mg/dL

## 2022-08-13 LAB — TROPONIN I (HIGH SENSITIVITY): Troponin I (High Sensitivity): 12 ng/L (ref <18)

## 2022-08-13 LAB — CBG MONITORING, ED: Glucose-Capillary: 227 mg/dL — ABNORMAL HIGH (ref 70–99)

## 2022-08-13 LAB — SODIUM, URINE, RANDOM: Sodium, Ur: 68 mmol/L

## 2022-08-13 MED ORDER — SODIUM CHLORIDE 0.9 % IV BOLUS
1000.0000 mL | Freq: Once | INTRAVENOUS | Status: AC
  Administered 2022-08-13: 1000 mL via INTRAVENOUS

## 2022-08-13 MED ORDER — QUETIAPINE FUMARATE 100 MG PO TABS
100.0000 mg | ORAL_TABLET | Freq: Every day | ORAL | Status: DC
  Administered 2022-08-13 – 2022-08-24 (×10): 100 mg via ORAL
  Filled 2022-08-13 (×11): qty 1

## 2022-08-13 MED ORDER — SENNOSIDES-DOCUSATE SODIUM 8.6-50 MG PO TABS: 1.0000 | ORAL_TABLET | Freq: Every evening | ORAL | Status: DC | PRN

## 2022-08-13 MED ORDER — AMIODARONE LOAD VIA INFUSION
150.0000 mg | Freq: Once | INTRAVENOUS | Status: AC
  Administered 2022-08-13: 150 mg via INTRAVENOUS
  Filled 2022-08-13: qty 83.34

## 2022-08-13 MED ORDER — AMIODARONE HCL IN DEXTROSE 360-4.14 MG/200ML-% IV SOLN
30.0000 mg/h | INTRAVENOUS | Status: DC
  Administered 2022-08-14 (×3): 30 mg/h via INTRAVENOUS
  Filled 2022-08-13 (×3): qty 200

## 2022-08-13 MED ORDER — ACETAMINOPHEN 325 MG PO TABS
650.0000 mg | ORAL_TABLET | Freq: Four times a day (QID) | ORAL | Status: DC | PRN
  Administered 2022-08-16 – 2022-08-23 (×7): 650 mg via ORAL
  Filled 2022-08-13 (×7): qty 2

## 2022-08-13 MED ORDER — ONDANSETRON HCL 4 MG PO TABS: 4.0000 mg | ORAL_TABLET | Freq: Four times a day (QID) | ORAL | Status: DC | PRN

## 2022-08-13 MED ORDER — LEVOTHYROXINE SODIUM 50 MCG PO TABS
50.0000 ug | ORAL_TABLET | Freq: Every day | ORAL | Status: DC
  Administered 2022-08-14 – 2022-08-25 (×12): 50 ug via ORAL
  Filled 2022-08-13 (×11): qty 1
  Filled 2022-08-13: qty 2

## 2022-08-13 MED ORDER — PRAMIPEXOLE DIHYDROCHLORIDE 0.25 MG PO TABS
0.5000 mg | ORAL_TABLET | Freq: Every day | ORAL | Status: DC
  Administered 2022-08-14 – 2022-08-24 (×11): 0.5 mg via ORAL
  Filled 2022-08-13 (×12): qty 2

## 2022-08-13 MED ORDER — AMIODARONE HCL IN DEXTROSE 360-4.14 MG/200ML-% IV SOLN
60.0000 mg/h | INTRAVENOUS | Status: AC
  Administered 2022-08-13: 60 mg/h via INTRAVENOUS
  Filled 2022-08-13 (×2): qty 200

## 2022-08-13 MED ORDER — FLUOXETINE HCL 20 MG PO CAPS
20.0000 mg | ORAL_CAPSULE | Freq: Every day | ORAL | Status: DC
  Administered 2022-08-14 – 2022-08-25 (×12): 20 mg via ORAL
  Filled 2022-08-13 (×12): qty 1

## 2022-08-13 MED ORDER — VANCOMYCIN VARIABLE DOSE PER UNSTABLE RENAL FUNCTION (PHARMACIST DOSING): Status: DC

## 2022-08-13 MED ORDER — INSULIN ASPART 100 UNIT/ML IJ SOLN
0.0000 [IU] | Freq: Three times a day (TID) | INTRAMUSCULAR | Status: DC
  Administered 2022-08-14: 2 [IU] via SUBCUTANEOUS
  Administered 2022-08-14: 1 [IU] via SUBCUTANEOUS
  Administered 2022-08-14: 3 [IU] via SUBCUTANEOUS
  Administered 2022-08-15: 1 [IU] via SUBCUTANEOUS
  Administered 2022-08-15: 3 [IU] via SUBCUTANEOUS
  Administered 2022-08-15 – 2022-08-17 (×2): 2 [IU] via SUBCUTANEOUS
  Administered 2022-08-18: 1 [IU] via SUBCUTANEOUS
  Administered 2022-08-18: 2 [IU] via SUBCUTANEOUS
  Administered 2022-08-19: 1 [IU] via SUBCUTANEOUS
  Administered 2022-08-19: 2 [IU] via SUBCUTANEOUS
  Administered 2022-08-20: 1 [IU] via SUBCUTANEOUS
  Administered 2022-08-20: 2 [IU] via SUBCUTANEOUS
  Administered 2022-08-21: 1 [IU] via SUBCUTANEOUS
  Administered 2022-08-21 (×2): 2 [IU] via SUBCUTANEOUS
  Administered 2022-08-22: 1 [IU] via SUBCUTANEOUS
  Administered 2022-08-22 – 2022-08-23 (×4): 2 [IU] via SUBCUTANEOUS
  Administered 2022-08-23 – 2022-08-24 (×2): 1 [IU] via SUBCUTANEOUS
  Administered 2022-08-24: 2 [IU] via SUBCUTANEOUS
  Administered 2022-08-25: 1 [IU] via SUBCUTANEOUS

## 2022-08-13 MED ORDER — SODIUM CHLORIDE 0.9% FLUSH
3.0000 mL | Freq: Two times a day (BID) | INTRAVENOUS | Status: DC
  Administered 2022-08-14 – 2022-08-25 (×15): 3 mL via INTRAVENOUS

## 2022-08-13 MED ORDER — ACETAMINOPHEN 650 MG RE SUPP: 650.0000 mg | Freq: Four times a day (QID) | RECTAL | Status: DC | PRN

## 2022-08-13 MED ORDER — APIXABAN 2.5 MG PO TABS
2.5000 mg | ORAL_TABLET | Freq: Two times a day (BID) | ORAL | Status: DC
  Administered 2022-08-13 – 2022-08-26 (×27): 2.5 mg via ORAL
  Filled 2022-08-13 (×27): qty 1

## 2022-08-13 MED ORDER — VANCOMYCIN HCL 1250 MG/250ML IV SOLN
1250.0000 mg | Freq: Once | INTRAVENOUS | Status: AC
  Administered 2022-08-13: 1250 mg via INTRAVENOUS
  Filled 2022-08-13: qty 250

## 2022-08-13 MED ORDER — ONDANSETRON HCL 4 MG/2ML IJ SOLN
4.0000 mg | Freq: Four times a day (QID) | INTRAMUSCULAR | Status: DC | PRN
  Administered 2022-08-20 – 2022-08-22 (×4): 4 mg via INTRAVENOUS
  Filled 2022-08-13 (×4): qty 2

## 2022-08-13 MED ORDER — SODIUM CHLORIDE 0.9 % IV SOLN
2.0000 g | Freq: Once | INTRAVENOUS | Status: AC
  Administered 2022-08-13: 2 g via INTRAVENOUS
  Filled 2022-08-13: qty 12.5

## 2022-08-13 NOTE — Hospital Course (Signed)
Melissa [name] is a 88 y.o. female with medical history significant for HFrEF (EF 20-25% per TTE 04/13/2021), NICM, PAF on Eliquis, CKD stage IIIb, insulin-dependent T2DM, HTN, hypothyroidism, HLD, anxiety, delusional parasitosis, mild cognitive impairment who is admitted with paroxysmal atrial fibrillation with RVR.  Seen by cardiology and started on IV amiodarone drip.

## 2022-08-13 NOTE — ED Notes (Signed)
Pt placed on hospital bed

## 2022-08-13 NOTE — Assessment & Plan Note (Signed)
Creatinine 2.42 on admit compared to baseline around 1.3.  Potentially prerenal from hypoperfusion in setting of A-fib with RVR. -S/p 1 L NS in the ED -Hold further fluids given HFrEF -Obtain urine studies -Renal ultrasound -Monitor UOP
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
## 2022-08-13 NOTE — Assessment & Plan Note (Signed)
Likely due to A-fib with RVR.  Given 1 L NS in the ED.  SBP maintaining in the 90s with MAP 70s.  No evidence of infection.  Sepsis not ruled in. -S/p vancomycin and cefepime, hold further antibiotics -Follow blood cultures -Hold antihypertensives

## 2022-08-13 NOTE — ED Provider Notes (Signed)
[facility] EMERGENCY DEPARTMENT Provider Note   CSN: [id number] Arrival date & time: 08/13/22  1717     History  Chief Complaint  Patient presents with   Code Sepsis    Melissa [name] is a 88 y.o. female.  HPI   88 year old female with medical history significant for HTN, GERD, HLD, DM 2, pneumonia, CHF with last EF of 25%, paroxysmal atrial fibrillation on Eliquis, CKD who presents to the emergency department with cough, shortness of breath.  The history is provided by the patient in addition to the patient's daughter.  They state that the patient has had a cough and mild shortness of breath since this past Saturday.  No fevers or chills at home.  Over the last day, she has significantly worsened.  The patient's daughter states that she was recently diagnosed with mono and started on antibiotics and has been taking outpatient antibiotics but she is not sure of the name of it.  Per EMS, the patient was found to be in atrial fibrillation with RVR with rates to the 160s.  She was borderline hypotensive on arrival. I discussed the patient's CODE STATUS with the patient and her daughter bedside and she would like to remain full code.  Home Medications Prior to Admission medications   Medication Sig Start Date End Date Taking? Authorizing Provider  albuterol (VENTOLIN HFA) 108 (90 Base) MCG/ACT inhaler Inhale 2 puffs into the lungs every 6 (six) hours as needed for wheezing or shortness of breath. 06/21/22   Terrilee Croak, MD  blood glucose meter kit and supplies KIT Dispense based on patient and insurance preference. Use up to four times daily as directed. 01/13/21   Copland, Gay Filler, MD  Blood Glucose Monitoring Suppl DEVI Dispense one meter to use and monitor glucose as needed 12/18/20   Copland, Gay Filler, MD  cetirizine (ZYRTEC) 10 MG tablet Take 10 mg by mouth in the morning.    [provider]  Cholecalciferol (VITAMIN D3) 1000 UNITS CAPS Take 1,000 Units by  mouth every evening.    [provider]  cyanocobalamin (VITAMIN B12) 1000 MCG tablet TAKE 1 TABLET DAILY 07/01/22   Copland, Gay Filler, MD  ELIQUIS 2.5 MG TABS tablet TAKE 1 TABLET TWICE A DAY Patient taking differently: Take 2.5 mg by mouth See admin instructions. Take one tablet (2.5 mg) by mouth twice daily - with lunch and at bedtime 06/14/21   [name], MD  Fe Fum-FePoly-Vit C-Vit B3 (INTEGRA) 62.5-62.5-40-3 MG CAPS TAKE 1 CAPSULE DAILY 07/10/22   Copland, Gay Filler, MD  FLUoxetine (PROZAC) 20 MG capsule Take 1 capsule (20 mg total) by mouth daily. Patient taking differently: Take 20 mg by mouth daily with breakfast. 10/17/21   Dohmeier, Asencion Partridge, MD  furosemide (LASIX) 40 MG tablet TAKE 1 TABLET DAILY 07/10/22   Copland, Gay Filler, MD  guaiFENesin-dextromethorphan (ROBITUSSIN DM) 100-10 MG/5ML syrup Take 5 mLs by mouth every 4 (four) hours as needed for cough. 06/21/22   Dahal, Marlowe Aschoff, MD  HUMALOG KWIKPEN 100 UNIT/ML KwikPen Inject 6 units under the skin daily with supper. Patient taking differently: 6 Units daily with supper. 02/24/20   [name], MD  insulin detemir (LEVEMIR FLEXTOUCH) 100 UNIT/ML FlexPen Inject 15 Units into the skin at bedtime. 06/21/22   Terrilee Croak, MD  levothyroxine (SYNTHROID) 50 MCG tablet TAKE 1 TABLET DAILY BEFORE BREAKFAST Patient taking differently: Take 50 mcg by mouth daily before breakfast. 06/27/21   Copland, Gay Filler, MD  metoprolol  succinate (TOPROL XL) 25 MG 24 hr tablet Take 2 tablets (50 mg total) by mouth daily. Take with or immediately following a meal. 06/21/22 09/19/22  Dahal, Marlowe Aschoff, MD  Omega-3 Fatty Acids (FISH OIL) 1000 MG CAPS Take 1,000 mg by mouth daily with supper.    [provider]  Jonetta Speak LANCETS 03E MISC Use to check blood sugar 4 times per day dx code E11.29 01/19/18   [name], MD  [facility] VERIO test strip USE AS INSTRUCTED TO CHECK BLOOD SUGAR 4 TIMES PER DAY 01/11/21   [name], MD  pantoprazole (PROTONIX) 40  MG tablet Take 1 tablet (40 mg total) by mouth 2 (two) times daily. Continue taking once a day after finishing the course of twice a day for  2 months Patient taking differently: Take 40 mg by mouth 2 (two) times daily with a meal. 04/10/21 06/09/22  Shelly Coss, MD  polyethylene glycol (MIRALAX / GLYCOLAX) 17 g packet Take 17 g by mouth daily as needed (constipation.). 05/23/21   [name], MD  pramipexole (MIRAPEX) 0.25 MG tablet Take 1 tablet (0.25 mg total) by mouth daily with supper. Take 1 tablet at 5 pm Patient taking differently: Take 0.5 mg by mouth at bedtime. 10/17/21   Dohmeier, Asencion Partridge, MD  predniSONE (DELTASONE) 10 MG tablet Take 4 tablets daily X 2 days, then, Take 3 tablets daily X 2 days, then, Take 2 tablets daily X 2 days, then, Take 1 tablets daily X 1 day. 06/21/22   Terrilee Croak, MD  pregabalin (LYRICA) 100 MG capsule Take 100 mg by mouth daily. 05/08/22   [provider]  QUEtiapine (SEROQUEL) 50 MG tablet TAKE 2 TABLETS AT BEDTIME 02/04/22   Dohmeier, Asencion Partridge, MD  Respiratory Therapy Supplies (FLUTTER) DEVI 1 Device by Does not apply route as needed. 02/04/20   Lauraine Rinne, NP  senna (SENOKOT) 8.6 MG TABS tablet Take 2 tablets by mouth daily as needed (constipation).    [provider]  sucralfate (CARAFATE) 1 GM/10ML suspension Take 10 mLs (1 g total) by mouth 2 (two) times daily. Use as needed for stomach pain Patient taking differently: Take 1 g by mouth 2 (two) times daily as needed (stomach pain). 05/08/21   Copland, Gay Filler, MD  traZODone (DESYREL) 100 MG tablet Take 1 tablet (100 mg total) by mouth at bedtime. 10/17/21   Dohmeier, Asencion Partridge, MD      Allergies    Patient has no known allergies.    Review of Systems   Review of Systems  Unable to perform ROS: Acuity of condition    Physical Exam Updated Vital Signs BP 95/60 (BP Location: Right Arm)   Pulse (!) 120   Temp 98 F (36.7 C) (Oral)   Resp (!) 22   Ht _0  (1.626 m)   Wt 63.5 kg    SpO2 100%   BMI 24.03 kg/m  Physical Exam Vitals and nursing note reviewed.  Constitutional:      General: She is not in acute distress.    Appearance: She is ill-appearing.  HENT:     Head: Normocephalic and atraumatic.  Eyes:     Conjunctiva/sclera: Conjunctivae normal.     Pupils: Pupils are equal, round, and reactive to light.  Cardiovascular:     Rate and Rhythm: Tachycardia present. Rhythm irregular.     Pulses: Normal pulses.  Pulmonary:     Effort: Tachypnea present. No respiratory distress.     Breath sounds: Rhonchi present.  Abdominal:     General: There is no distension.     Tenderness: There is no guarding.  Musculoskeletal:        General: No deformity or signs of injury.     Cervical back: Neck supple.  Skin:    Findings: No lesion or rash.  Neurological:     General: No focal deficit present.     Mental Status: She is alert. Mental status is at baseline.     ED Results / Procedures / Treatments   Labs (all labs ordered are listed, but only abnormal results are displayed) Labs Reviewed  COMPREHENSIVE METABOLIC PANEL - Abnormal; Notable for the following components:      Result Value   Glucose, Bld 175 (*)    BUN 37 (*)    Creatinine, Ser 2.42 (*)    Calcium 8.5 (*)    Albumin 3.2 (*)    AST 12 (*)    GFR, Estimated 18 (*)    All other components within normal limits  CBC WITH DIFFERENTIAL/PLATELET - Abnormal; Notable for the following components:   RBC 3.62 (*)    Hemoglobin 10.1 (*)    HCT 31.9 (*)    RDW 17.3 (*)    All other components within normal limits  PROTIME-INR - Abnormal; Notable for the following components:   Prothrombin Time 16.2 (*)    INR 1.3 (*)    All other components within normal limits  CULTURE, BLOOD (ROUTINE X 2)  CULTURE, BLOOD (ROUTINE X 2)  MRSA NEXT GEN BY PCR, NASAL  LACTIC ACID, PLASMA  URINALYSIS, ROUTINE W REFLEX MICROSCOPIC  BRAIN NATRIURETIC PEPTIDE  MAGNESIUM  TROPONIN I (HIGH SENSITIVITY)  TROPONIN I  (HIGH SENSITIVITY)    EKG EKG Interpretation  Date/Time:  Tuesday August 13 2022 17:35:08 EDT Ventricular Rate:  149 PR Interval:    QRS Duration: 88 QT Interval:  313 QTC Calculation: 493 R Axis:   42 Text Interpretation: Atrial fibrillation with rapid V-rate Low voltage, precordial leads Confirmed by Regan Lemming ([id number]) on 08/13/2022 6:04:04 PM  Radiology DG Chest Portable 1 View  Result Date: 08/13/2022 CLINICAL DATA:  Sepsis. EXAM: PORTABLE CHEST 1 VIEW COMPARISON:  Chest radiograph dated 06/16/2022. FINDINGS: There is diffuse chronic interstitial coarsening and bronchitic changes. Bilateral streaky densities may be chronic or represent vascular congestion. Developing infiltrate is not excluded. No focal consolidation, pleural effusion or pneumothorax. The cardiac silhouette is within normal limits. Atherosclerotic calcification of the aorta. No acute osseous pathology. IMPRESSION: Chronic interstitial coarsening and bronchitic changes. Developing infiltrate is not excluded. Electronically Signed   By: Anner Crete M.D.   On: 08/13/2022 18:11    Procedures .Critical Care  Performed by: Regan Lemming, MD Authorized by: Regan Lemming, MD   Critical care provider statement:    Critical care time (minutes):  42   Critical care was time spent personally by me on the following activities:  Development of treatment plan with patient or surrogate, discussions with consultants, evaluation of patient's response to treatment, examination of patient, ordering and review of laboratory studies, ordering and review of radiographic studies, ordering and performing treatments and interventions, pulse oximetry, re-evaluation of patient's condition and review of [place] discussed with: admitting provider       Medications Ordered in ED Medications  vancomycin (VANCOREADY) IVPB 1250 mg/250 mL (1,250 mg Intravenous New Bag/Given 08/13/22 1910)  amiodarone (NEXTERONE) 1.8 mg/mL  load via infusion 150 mg (150 mg Intravenous Bolus from Bag 08/13/22 1926)  Followed by  amiodarone (NEXTERONE PREMIX) 360-4.14 MG/200ML-% (1.8 mg/mL) IV infusion (60 mg/hr Intravenous New Bag/Given 08/13/22 1927)    Followed by  amiodarone (NEXTERONE PREMIX) 360-4.14 MG/200ML-% (1.8 mg/mL) IV infusion (has no administration in time range)  vancomycin variable dose per unstable renal function (pharmacist dosing) (has no administration in time range)  sodium chloride 0.9 % bolus 1,000 mL (0 mLs Intravenous Stopped 08/13/22 1910)  ceFEPIme (MAXIPIME) 2 g in sodium chloride 0.9 % 100 mL IVPB (0 g Intravenous Stopped 08/13/22 1844)    ED Course/ Medical Decision Making/ A&P                           Medical Decision Making Amount and/or Complexity of Data Reviewed Labs: ordered. Radiology: ordered.  Risk Prescription drug management. Decision regarding hospitalization.    88 year old female with medical history significant for HTN, GERD, HLD, DM 2, pneumonia, CHF with last EF of 25%, paroxysmal atrial fibrillation on Eliquis, CKD who presents to the emergency department with cough, shortness of breath.  The history is provided by the patient in addition to the patient's daughter.  They state that the patient has had a cough and mild shortness of breath since this past Saturday.  No fevers or chills at home.  Over the last day, she has significantly worsened.  The patient's daughter states that she was recently diagnosed with mono and started on antibiotics and has been taking outpatient antibiotics but she is not sure of the name of it.  Per EMS, the patient was found to be in atrial fibrillation with RVR with rates to the 160s.  She was borderline hypotensive on arrival. I discussed the patient's CODE STATUS with the patient and her daughter bedside and she would like to remain full code.  On arrival, the patient was afebrile, temperature 98, borderline tachypneic RR 22, borderline  hypotensive, BP 92/66, with heart rates in the 160s on cardiac telemetry.  The patient was found to be in atrial fibrillation with RVR.  This was confirmed on EKG.  No STEMI on EKG.  The patient denies any chest pain, endorses shortness of breath and fatigue.  She is on anticoagulation and has been compliant with the medication.  Differential diagnosis includes sepsis from commune acquired pneumonia, atrial fibrillation with RVR, resulting in pulmonary edema and worsening respiratory function, CHF exacerbation, PE although less likely as the patient is anticoagulated.  Lower concern for ACS.  With some concern for sepsis from a community-acquired pneumonia triggering uncontrolled atrial fibrillation, the patient was initially administered a 1 L IV fluid bolus.  Chest x-ray was performed which revealed coarse interstitial thickening: IMPRESSION:  Chronic interstitial coarsening and bronchitic changes. Developing  infiltrate is not excluded.    Laboratory evaluation points away toward from infection as the patient CBC was without leukocytosis, WBC 7, stable anemia to 10.1, CMP with evidence of an AKI on CKD with a creatinine of 2.42.  Troponin, BNP, magnesium, blood cultures x2 were collected and pending.  Lactic acid was normal at 1.7.  Lower concern for acute PE as the patient is chronically anticoagulated on Eliquis.  She denies any chest pain.  Lower concern for ACS.  Discussed care with cardiology, Dr Chalmers Cater.  He stated the patient did appear to be somewhat hypovolemic rather than volume overloaded but did not recommend administering any further fluids given her history of heart failure with an EF of 25%.  As the patient's blood pressure was  trending hypotensive, the patient was administered an amiodarone bolus and started on amiodarone infusion.  She was covered with broad-spectrum antibiotics due to concern for possible sepsis with a respiratory source.  Following amiodarone and fluid resuscitation  initially, the patient had significant improvement in rate control to the low 100s.  She maintained her blood pressures in the emergency department with no further episodes of hypotension.  Plan will be for admission to stepdown for close monitoring.  Hospitalist medicine consulted for admission, Dr. Posey Pronto accepting.   Final Clinical Impression(s) / ED Diagnoses Final diagnoses:  AKI (acute kidney injury) ([place])  Atrial fibrillation with RVR ([place])  Acute cough  Other fatigue    Rx / DC Orders ED Discharge Orders     None         Regan Lemming, MD 08/13/22 2023

## 2022-08-13 NOTE — Assessment & Plan Note (Addendum)
EF 20-25% by last TTE 04/13/2021.  Appears euvolemic but may have some mild pulm edema on CXR.  Received 1 L NS in the ED for hypotension.  Has AKI on CKD stage IIIb. -Obtain echocardiogram -Hold Lasix for now, monitor strict I/O's and daily weights -Hold further fluids -Hold Toprol-XL given hypotension -Not currently on ACE/ARB/Spiro; would not add given AKI -Cardiology following

## 2022-08-13 NOTE — Assessment & Plan Note (Addendum)
Presenting with A-fib with RVR, rate 140s.  Blood pressure soft.  Cardiology consulted and started IV amiodarone drip.  Tachycardia, tachypnea, hypotension all likely driven by A-fib with RVR.  No sign of active infection.  Sepsis not ruled in. -Cardiology following -Continue IV amiodarone per cardiology -Update echocardiogram -Keep on telemetry -Hold Toprol-XL with soft blood pressure -Continue Eliquis 2.5 mg twice daily -Check magnesium and TSH

## 2022-08-13 NOTE — Assessment & Plan Note (Signed)
Placed on SSI. 
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
## 2022-08-13 NOTE — Progress Notes (Addendum)
Pharmacy Antibiotic Note  Melissa [name] is a 88 y.o. female admitted on 08/13/2022 presenting with concern for sepsis process.  Pharmacy has been consulted for vancomycin dosing.  Gram negative coverage per MD  Plan: Vancomycin 1250 mg IV x 1, then variable dosing d/t unstable renal function Add MRSA PCR Monitor renal function, Cx/PCR to narrow Vancomycin levels as needed  Height: '5\' 4"'$  (162.6 cm) Weight: 63.5 kg (140 lb) IBW/kg (Calculated) : 54.7  Temp (24hrs), Avg:98 F (36.7 C), Min:98 F (36.7 C), Max:98 F (36.7 C)  Recent Labs  Lab 08/13/22 1740  WBC 7.0  CREATININE 2.42*  LATICACIDVEN 1.7    Estimated Creatinine Clearance: 12.8 mL/min (A) (by C-G formula based on SCr of 2.42 mg/dL (H)).    No Known Allergies  Bertis Ruddy, PharmD Clinical Pharmacist ED Pharmacist Phone # [phone] 08/13/2022 7:19 PM

## 2022-08-13 NOTE — ED Triage Notes (Signed)
Pt BIB from home for sepsis/ Pt got dx with pneumonia recently. Pt has hx of afib and is in afib at 160. Pt is axox4. BP soft on arrival.

## 2022-08-13 NOTE — Assessment & Plan Note (Signed)
Continue home dose Synthroid 50 mcg daily.  Check TSH.

## 2022-08-14 ENCOUNTER — Inpatient Hospital Stay (HOSPITAL_COMMUNITY): Payer: Medicare Other

## 2022-08-14 DIAGNOSIS — I4891 Unspecified atrial fibrillation: Secondary | ICD-10-CM | POA: Diagnosis not present

## 2022-08-14 DIAGNOSIS — I480 Paroxysmal atrial fibrillation: Secondary | ICD-10-CM | POA: Diagnosis not present

## 2022-08-14 LAB — CBC
HCT: 31 % — ABNORMAL LOW (ref 36.0–46.0)
Hemoglobin: 9.5 g/dL — ABNORMAL LOW (ref 12.0–15.0)
MCH: 27.3 pg (ref 26.0–34.0)
MCHC: 30.6 g/dL (ref 30.0–36.0)
MCV: 89.1 fL (ref 80.0–100.0)
Platelets: 178 10*3/uL (ref 150–400)
RBC: 3.48 MIL/uL — ABNORMAL LOW (ref 3.87–5.11)
RDW: 17.4 % — ABNORMAL HIGH (ref 11.5–15.5)
WBC: 7.1 10*3/uL (ref 4.0–10.5)
nRBC: 0 % (ref 0.0–0.2)

## 2022-08-14 LAB — GLUCOSE, CAPILLARY
Glucose-Capillary: 164 mg/dL — ABNORMAL HIGH (ref 70–99)
Glucose-Capillary: 122 mg/dL — ABNORMAL HIGH (ref 70–99)
Glucose-Capillary: 84 mg/dL (ref 70–99)

## 2022-08-14 LAB — COMPREHENSIVE METABOLIC PANEL
ALT: 9 U/L (ref 0–44)
AST: 13 U/L — ABNORMAL LOW (ref 15–41)
Albumin: 2.9 g/dL — ABNORMAL LOW (ref 3.5–5.0)
Alkaline Phosphatase: 62 U/L (ref 38–126)
Anion gap: 13 (ref 5–15)
BUN: 36 mg/dL — ABNORMAL HIGH (ref 8–23)
CO2: 21 mmol/L — ABNORMAL LOW (ref 22–32)
Calcium: 7.9 mg/dL — ABNORMAL LOW (ref 8.9–10.3)
Chloride: 105 mmol/L (ref 98–111)
Creatinine, Ser: 2.53 mg/dL — ABNORMAL HIGH (ref 0.44–1.00)
GFR, Estimated: 17 mL/min — ABNORMAL LOW (ref >60)
Glucose, Bld: 226 mg/dL — ABNORMAL HIGH (ref 70–99)
Potassium: 4.5 mmol/L (ref 3.5–5.1)
Sodium: 139 mmol/L (ref 135–145)
Total Bilirubin: 0.4 mg/dL (ref 0.3–1.2)
Total Protein: 6.2 g/dL — ABNORMAL LOW (ref 6.5–8.1)

## 2022-08-14 LAB — CBG MONITORING, ED: Glucose-Capillary: 231 mg/dL — ABNORMAL HIGH (ref 70–99)

## 2022-08-14 LAB — ECHOCARDIOGRAM COMPLETE
Area-P 1/2: 3.61 cm2
Calc EF: 30.3 %
Height: 64 in
MV M vel: 3.68 m/s
MV Peak grad: 54.2 mmHg
S' Lateral: 5.1 cm
Single Plane A2C EF: 36.6 %
Single Plane A4C EF: 23.3 %
Weight: 2240 oz

## 2022-08-14 LAB — MAGNESIUM: Magnesium: 1.1 mg/dL — ABNORMAL LOW (ref 1.7–2.4)

## 2022-08-14 MED ORDER — SODIUM CHLORIDE 0.9 % IV SOLN
1.5000 g | Freq: Two times a day (BID) | INTRAVENOUS | Status: AC
  Administered 2022-08-14 – 2022-08-19 (×10): 1.5 g via INTRAVENOUS
  Filled 2022-08-14 (×10): qty 4

## 2022-08-14 MED ORDER — INSULIN DETEMIR 100 UNIT/ML ~~LOC~~ SOLN
12.0000 [IU] | Freq: Every day | SUBCUTANEOUS | Status: DC
  Administered 2022-08-14 – 2022-08-17 (×4): 12 [IU] via SUBCUTANEOUS
  Filled 2022-08-14 (×5): qty 0.12

## 2022-08-14 MED ORDER — GUAIFENESIN-DM 100-10 MG/5ML PO SYRP
5.0000 mL | ORAL_SOLUTION | ORAL | Status: DC | PRN
  Administered 2022-08-15 – 2022-08-16 (×2): 5 mL via ORAL
  Filled 2022-08-14 (×3): qty 5

## 2022-08-14 MED ORDER — MAGNESIUM SULFATE IN D5W 1-5 GM/100ML-% IV SOLN
1.0000 g | Freq: Once | INTRAVENOUS | Status: AC
  Administered 2022-08-14: 1 g via INTRAVENOUS
  Filled 2022-08-14: qty 100

## 2022-08-14 NOTE — Progress Notes (Signed)
Received callback from Dr. Nevada Crane regarding patient's BP.  Orders to continue monitoring closely and to call back if MAP falls below 65.  Patient A&Ox4 and asymptomatic.
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
## 2022-08-14 NOTE — Progress Notes (Addendum)
PROGRESS NOTE    Melissa [name]  QVZ:[id number] DOB: September 13, 1928 DOA: 08/13/2022 PCP: Darreld Mclean, MD  Chief Complaint  Patient presents with   Code Sepsis    Brief Narrative:  Melissa [name] is Melissa [name] 88 y.o. female with medical history significant for HFrEF (EF 20-25% per TTE 04/13/2021), NICM, PAF on Eliquis, CKD stage IIIb, insulin-dependent T2DM, HTN, hypothyroidism, HLD, anxiety, delusional parasitosis, mild cognitive impairment who is admitted with paroxysmal atrial fibrillation with RVR.  Seen by cardiology and started on IV amiodarone drip.    Assessment & Plan:   Principal Problem:   Paroxysmal atrial fibrillation with rapid ventricular response (HCC) Active Problems:   Acute renal failure superimposed on stage 3b chronic kidney disease (HCC)   Hypotension   Chronic HFrEF (heart failure with reduced ejection fraction) ([place])   Type 2 diabetes mellitus with diabetic neuropathy, with long-term current use of insulin (HCC)   Hypothyroidism, acquired   Assessment and Plan: Paroxysmal atrial fibrillation with rapid ventricular response ([place]) Presenting with Melissa [name]-fib with RVR, rate 140s.  Blood pressure soft.  Cardiology consulted and started IV amiodarone drip.  Tachycardia, tachypnea, hypotension all likely driven by Norely Schlick-fib with RVR.  No sign of active infection.  Sepsis not ruled in. -Cardiology following -Continue IV amiodarone per cardiology -Update echocardiogram -Keep on telemetry -Hold Toprol-XL with soft blood pressure -Continue Eliquis 2.5 mg twice daily -Check magnesium and TSH   Hypotension Suspect due to Melissa [name]-fib with RVR, hypovolemia?  Given 1 L NS in the ED.  SBP improved at this time.  At this time, no evidence of infection.  Sepsis not ruled in. -S/p vancomycin and cefepime, hold further antibiotics -Follow blood cultures -Hold antihypertensives  Acute renal failure superimposed on stage 3b chronic kidney disease ([place]) Creatinine 2.42 on admit compared to  baseline around 1.3.  Suspected prerenal from hypoperfusion in setting of Melissa [name]-fib with RVR, hypovolemia? -S/p 1 L NS in the ED -Hold further fluids given HFrEF -UA with negative protein, 0-5 RBC's -> fena suggestive of intrinsic dz (urine urea nitrogen pending for FeUrea given diuretic use) -Renal ultrasound with chronic medical renal disease -Monitor UOP   Concern for Aspiration - noted while cards in room this AM - repeat CXR - SLP eval - hold abx for now, will follow closely  Chronic HFrEF (heart failure with reduced ejection fraction) (HCC) EF 20-25% by last TTE 04/13/2021.  Appears euvolemic but may have some mild pulm edema on CXR.  Received 1 L NS in the ED for hypotension.  Has AKI on CKD stage IIIb. -Obtain echocardiogram -Hold Lasix for now, monitor strict I/O's and daily weights -Hold further fluids -Hold Toprol-XL given hypotension -Not currently on ACE/ARB/Spiro; would not add given AKI -Cardiology following   Type 2 diabetes mellitus with diabetic neuropathy, with long-term current use of insulin ([place]) Placed on SSI. A1c 8/5 (05/2022)   Hypothyroidism, acquired Continue home dose Synthroid 50 mcg daily.  Check TSH.      DVT prophylaxis: eliquis Code Status: full Family Communication: none at bedside - called daughter, no answer Disposition:   Status is: Inpatient Remains inpatient appropriate because: continued need for inpatient management   Consultants:  cardiology  Procedures:  none  Antimicrobials:  Anti-infectives (From admission, onward)    Start     Dose/Rate Route Frequency Ordered Stop   08/13/22 1923  vancomycin variable dose per unstable renal function (pharmacist dosing)  Status:  Discontinued         Does not apply  See admin instructions 08/13/22 1924 08/13/22 2105   08/13/22 1830  vancomycin (VANCOREADY) IVPB 1250 mg/250 mL        1,250 mg 166.7 mL/hr over 90 Minutes Intravenous  Once 08/13/22 1828 08/13/22 2040   08/13/22 1815  ceFEPIme  (MAXIPIME) 2 g in sodium chloride 0.9 % 100 mL IVPB        2 g 200 mL/hr over 30 Minutes Intravenous  Once 08/13/22 1804 08/13/22 1844       Subjective: C/o SOB  Objective: Vitals:   08/14/22 0520 08/14/22 0700 08/14/22 0800 08/14/22 0900  BP:  (!) 111/98 (!) 113/103 (!) 127/116  Pulse:  (!) 123 (!) 113 (!) 117  Resp:  (!) '22 16 17  '$ Temp: 97.6 F (36.4 C)     TempSrc: Oral     SpO2:  94% 92% 99%  Weight:      Height:       No intake or output data in the 24 hours ending 08/14/22 0933 Filed Weights   08/13/22 1737  Weight: 63.5 kg    Examination:  General exam: Appears calm and comfortable  Respiratory system: coarse breath sounds, crackles at bases, transmitted upper airway sounds Cardiovascular system: tachycardic, irregularly irregular (Tele with fib) Gastrointestinal system: Abdomen is nondistended, soft and nontender.  Central nervous system: Alert and oriented. No focal neurological deficits. Extremities: no LEE    Data Reviewed: I have personally reviewed following labs and imaging studies  CBC: Recent Labs  Lab 08/13/22 1740 08/14/22 0518  WBC 7.0 7.1  NEUTROABS 4.6  --   HGB 10.1* 9.5*  HCT 31.9* 31.0*  MCV 88.1 89.1  PLT 182 740    Basic Metabolic Panel: Recent Labs  Lab 08/13/22 1740 08/13/22 1957 08/14/22 0518  NA 140  --  139  K 3.9  --  4.5  CL 105  --  105  CO2 22  --  21*  GLUCOSE 175*  --  226*  BUN 37*  --  36*  CREATININE 2.42*  --  2.53*  CALCIUM 8.5*  --  7.9*  MG  --  1.2* 1.1*    GFR: Estimated Creatinine Clearance: 12.3 mL/min ([name]) (by C-G formula based on SCr of 2.53 mg/dL (H)).  Liver Function Tests: Recent Labs  Lab 08/13/22 1740 08/14/22 0518  AST 12* 13*  ALT 8 9  ALKPHOS 68 62  BILITOT 0.4 0.4  PROT 6.7 6.2*  ALBUMIN 3.2* 2.9*    CBG: Recent Labs  Lab 08/13/22 2319 08/14/22 0813  GLUCAP 227* 231*     Recent Results (from the past 240 hour(s))  MRSA Next Gen by PCR, Nasal     Status: None    Collection Time: 08/13/22  6:29 PM   Specimen: Nasal Mucosa; Nasal Swab  Result Value Ref Range Status   MRSA by PCR Next Gen NOT DETECTED NOT DETECTED Final    Comment: (NOTE) The GeneXpert MRSA Assay (FDA approved for NASAL specimens only), is one component of [facility] comprehensive MRSA colonization surveillance program. It is not intended to diagnose MRSA infection nor to guide or monitor treatment for MRSA infections. Test performance is not FDA approved in patients less than 38 years old. Performed at [facility], [place] [address] [place], [place] [address]          Radiology Studies: US RENAL  Result Date: 08/13/2022 CLINICAL DATA:  Acute renal failure superimposed on stage III B chronic kidney disease. EXAM: RENAL / URINARY TRACT ULTRASOUND  COMPLETE COMPARISON:  CT 12/31/2021 FINDINGS: Right Kidney: Renal measurements: 7.8 x 3.7 x 3.7 cm = volume: 56 mL. Increased renal parenchymal echogenicity and thinning of the parenchyma. No hydronephrosis. No evidence of renal stone or focal lesion. Left Kidney: Renal measurements: 7.9 x 4.5 x 3.2 cm = volume: 59 mL. Increased renal parenchymal echogenicity and thinning of the parenchyma. No hydronephrosis. No evidence of renal stone or focal lesion. Bladder: Appears normal for degree of bladder distention. Other: None. IMPRESSION: 1. Bilateral renal parenchymal thinning and increased echogenicity consistent with chronic medical renal disease. 2. No obstructive uropathy or focal renal abnormality. Electronically Signed   By: Keith Rake M.D.   On: 08/13/2022 21:28   DG Chest Portable 1 View  Result Date: 08/13/2022 CLINICAL DATA:  Sepsis. EXAM: PORTABLE CHEST 1 VIEW COMPARISON:  Chest radiograph dated 06/16/2022. FINDINGS: There is diffuse chronic interstitial coarsening and bronchitic changes. Bilateral streaky densities may be chronic or represent vascular congestion. Developing infiltrate is not excluded. No focal consolidation,  pleural effusion or pneumothorax. The cardiac silhouette is within normal limits. Atherosclerotic calcification of the aorta. No acute osseous pathology. IMPRESSION: Chronic interstitial coarsening and bronchitic changes. Developing infiltrate is not excluded. Electronically Signed   By: Anner Crete M.D.   On: 08/13/2022 18:11        Scheduled Meds:  apixaban  2.5 mg Oral BID   FLUoxetine  20 mg Oral Q breakfast   insulin aspart  0-9 Units Subcutaneous TID WC   levothyroxine  50 mcg Oral QAC breakfast   pramipexole  0.5 mg Oral QHS   QUEtiapine  100 mg Oral QHS   sodium chloride flush  3 mL Intravenous Q12H   Continuous Infusions:  amiodarone 30 mg/hr (08/14/22 0822)   magnesium sulfate bolus IVPB 1 g (08/14/22 0858)     LOS: 1 day    Time spent: over 30 min    Fayrene Helper, MD [facility]   To contact the attending provider between 7A-7P or the covering provider during after hours 7P-7A, please log into the web site [email or web address] and access using universal [place] password for that web site. If you do not have the password, please call the hospital operator.  08/14/2022, 9:33 AM

## 2022-08-14 NOTE — Progress Notes (Signed)
Mobility Specialist Progress Note    08/14/22 1524  Mobility  Activity Transferred to/from [facility]  Level of Assistance Minimal assist, patient does 75% or more  Assistive Device Other (Comment) (HHA)  Distance Ambulated (ft) 4 ft (2+2)  Activity Response Tolerated fair  $Mobility charge 1 Mobility   Pre-Mobility: 122 HR During Mobility: 126 HR Post-Mobility: 123 HR  Pt received in bed and agreeable. Encouraged pursed lip breathing. RN present to increase O2. Had void. Returned to bed with call bell in reach.    Hildred Alamin Mobility Specialist  Secure Chat Only
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
## 2022-08-14 NOTE — Progress Notes (Signed)
Inpatient Diabetes Program Recommendations  AACE/ADA: New Consensus Statement on Inpatient Glycemic Control (2015)  Target Ranges:  Prepandial:   less than 140 mg/dL      Peak postprandial:   less than 180 mg/dL (1-2 hours)      Critically ill patients:  140 - 180 mg/dL   Lab Results  Component Value Date   GLUCAP 231 (H) 08/14/2022   HGBA1C 8.5 (H) 06/09/2022    Review of Glycemic Control  Latest Reference Range & Units 08/13/22 23:19 08/14/22 08:13  Glucose-Capillary 70 - 99 mg/dL 227 (H) 231 (H)   Diabetes history: DM 2 Outpatient Diabetes medications:  Humalog 6 units with supper Levemir 22 units q HS Current orders for Inpatient glycemic control:  Novolog 0-9 units tid with meals   Inpatient Diabetes Program Recommendations:    Please consider adding Levemir 12 units q HS.   Thanks,  Adah Perl, RN, BC-ADM Inpatient Diabetes Coordinator Pager [phone]  (8a-5p)

## 2022-08-14 NOTE — Progress Notes (Signed)
   08/14/22 1930  Assess: MEWS Score  Temp (!) 97.4 F (36.3 C)  BP (!) 83/65  MAP (mmHg) 72  Pulse Rate (!) 119  ECG Heart Rate (!) 119  Resp 20  Level of Consciousness Alert  SpO2 100 %  O2 Device Nasal Cannula  O2 Flow Rate (L/min) 4 L/min  Assess: MEWS Score  MEWS Temp 0  MEWS Systolic 1  MEWS Pulse 2  MEWS RR 0  MEWS LOC 0  MEWS Score 3  MEWS Score Color Yellow  Assess: if the MEWS score is Yellow or Red  Were vital signs taken at a resting state? Yes  Focused Assessment Change from prior assessment (see assessment flowsheet)  Does the patient meet 2 or more of the SIRS criteria? Yes  Does the patient have a confirmed or suspected source of infection? Yes  Provider and Rapid Response Notified? Yes  MEWS guidelines implemented *See Row Information* Yes  Treat  MEWS Interventions Escalated (See documentation below)  Pain Scale 0-10  Pain Score 0  Take Vital Signs  Increase Vital Sign Frequency  Yellow: Q 2hr X 2 then Q 4hr X 2, if remains yellow, continue Q 4hrs  Escalate  MEWS: Escalate Yellow: discuss with charge nurse/RN and consider discussing with provider and RRT  Notify: Charge Nurse/RN  Name of Charge Nurse/RN Notified Warren Danes  Date Charge Nurse/RN Notified 08/14/22  Time Charge Nurse/RN Notified 1930  Notify: Provider  Provider Name/Title Dr. Nevada Crane  Date Provider Notified 08/14/22  Time Provider Notified 1930  Method of Notification Page (text page)  Notification Reason Change in status;Requested by patient/family  Provider response No new orders;Other (Comment) (no response as of 2040)  Notify: Rapid Response  Name of Rapid Response RN Notified [name], RN  Date Rapid Response Notified 08/14/22  Time Rapid Response Notified 1950  Document  Patient Outcome Not stable and remains on department  Progress note created (see row info) Yes  Assess: SIRS CRITERIA  SIRS Temperature  0  SIRS Pulse 1  SIRS Respirations  0  SIRS WBC 1  SIRS Score Sum   2

## 2022-08-14 NOTE — Progress Notes (Signed)
  Echocardiogram 2D Echocardiogram has been performed.  Melissa [name] 08/14/2022, 12:10 PM

## 2022-08-14 NOTE — Progress Notes (Signed)
Pharmacy Antibiotic Note  Melissa [name] is a 88 y.o. female admitted on 08/13/2022 presenting with concern for sepsis process.    Pt received one dose of vanc/cefepime yesterday. Change to Unasyn today to cover for PNA.  Scr 2.53  Plan: Unasyn 1.5g IV q12  Height: '5\' 4"'$  (162.6 cm) Weight: 63.5 kg (140 lb) IBW/kg (Calculated) : 54.7  Temp (24hrs), Avg:97.8 F (36.6 C), Min:97.6 F (36.4 C), Max:98.4 F (36.9 C)  Recent Labs  Lab 08/13/22 1740 08/14/22 0518  WBC 7.0 7.1  CREATININE 2.42* 2.53*  LATICACIDVEN 1.7  --      Estimated Creatinine Clearance: 12.3 mL/min (A) (by C-G formula based on SCr of 2.53 mg/dL (H)).    Allergies  Allergen Reactions   Gabapentin Other (See Comments)    hallucinations   Metoprolol Other (See Comments)    hypotensin    10/31 blood>> MRSA neg   Onnie Boer, PharmD, BCIDP, AAHIVP, CPP Infectious Disease Pharmacist 08/14/2022 6:18 PM

## 2022-08-14 NOTE — Progress Notes (Addendum)
Rounding Note    Patient Name: Melissa [name] Date of Encounter: 08/14/2022  [place] Cardiologist: [name], MD   Subjective   Patient continues to have SOB this AM. Denies chest pain. Has occasional palpitations   Inpatient Medications    Scheduled Meds:  apixaban  2.5 mg Oral BID   FLUoxetine  20 mg Oral Q breakfast   insulin aspart  0-9 Units Subcutaneous TID WC   levothyroxine  50 mcg Oral QAC breakfast   pramipexole  0.5 mg Oral QHS   QUEtiapine  100 mg Oral QHS   sodium chloride flush  3 mL Intravenous Q12H   Continuous Infusions:  amiodarone 30 mg/hr (08/14/22 0131)   PRN Meds: acetaminophen **OR** acetaminophen, ondansetron **OR** ondansetron (ZOFRAN) IV, senna-docusate   Vital Signs    Vitals:   08/13/22 2315 08/14/22 0200 08/14/22 0411 08/14/22 0520  BP: 1'01/73 94/73 98/70 '$   Pulse: 63 (!) 112 (!) 110   Resp: '19 17 19   '$ Temp:    97.6 F (36.4 C)  TempSrc:    Oral  SpO2: 99% 99% 99%   Weight:      Height:       No intake or output data in the 24 hours ending 08/14/22 0644    08/13/2022    5:37 PM 06/21/2022    5:00 AM 06/20/2022    5:00 AM  Last 3 Weights  Weight (lbs) 140 lb 143 lb 15.4 oz 141 lb 12.1 oz  Weight (kg) 63.504 kg 65.3 kg 64.3 kg      Telemetry    Atrial fibrillation, HR in the 110s-120s - Personally Reviewed  ECG    No new tracings - Personally Reviewed  Physical Exam   GEN: No acute distress.  Laying comfortably in the bed with head elevated  Neck: No JVD Cardiac: Irregular rate and rhythm, tachycardic. No murmurs, rubs, or gallops.  Respiratory: Fine crackles throughout. Normal WOB on [place]  GI: Soft, nontender, non-distended  MS: No edema; No deformity. Neuro:  Nonfocal  Psych: Normal affect   Labs    High Sensitivity Troponin:   Recent Labs  Lab 08/13/22 1957  TROPONINIHS 12     Chemistry Recent Labs  Lab 08/13/22 1740 08/13/22 1957 08/14/22 0518  NA 140  --  139  K 3.9  --  4.5  CL 105   --  105  CO2 22  --  21*  GLUCOSE 175*  --  226*  BUN 37*  --  36*  CREATININE 2.42*  --  2.53*  CALCIUM 8.5*  --  7.9*  MG  --  1.2* 1.1*  PROT 6.7  --  6.2*  ALBUMIN 3.2*  --  2.9*  AST 12*  --  13*  ALT 8  --  9  ALKPHOS 68  --  62  BILITOT 0.4  --  0.4  GFRNONAA 18*  --  17*  ANIONGAP 13  --  13    Lipids No results for input(s): "CHOL", "TRIG", "HDL", "LABVLDL", "LDLCALC", "CHOLHDL" in the last 168 hours.  Hematology Recent Labs  Lab 08/13/22 1740 08/14/22 0518  WBC 7.0 7.1  RBC 3.62* 3.48*  HGB 10.1* 9.5*  HCT 31.9* 31.0*  MCV 88.1 89.1  MCH 27.9 27.3  MCHC 31.7 30.6  RDW 17.3* 17.4*  PLT 182 178   Thyroid No results for input(s): "TSH", "FREET4" in the last 168 hours.  BNPNo results for input(s): "BNP", "PROBNP" in the last 168  hours.  DDimer No results for input(s): "DDIMER" in the last 168 hours.   Radiology    US RENAL  Result Date: 08/13/2022 CLINICAL DATA:  Acute renal failure superimposed on stage III B chronic kidney disease. EXAM: RENAL / URINARY TRACT ULTRASOUND COMPLETE COMPARISON:  CT 12/31/2021 FINDINGS: Right Kidney: Renal measurements: 7.8 x 3.7 x 3.7 cm = volume: 56 mL. Increased renal parenchymal echogenicity and thinning of the parenchyma. No hydronephrosis. No evidence of renal stone or focal lesion. Left Kidney: Renal measurements: 7.9 x 4.5 x 3.2 cm = volume: 59 mL. Increased renal parenchymal echogenicity and thinning of the parenchyma. No hydronephrosis. No evidence of renal stone or focal lesion. Bladder: Appears normal for degree of bladder distention. Other: None. IMPRESSION: 1. Bilateral renal parenchymal thinning and increased echogenicity consistent with chronic medical renal disease. 2. No obstructive uropathy or focal renal abnormality. Electronically Signed   By: Keith Rake M.D.   On: 08/13/2022 21:28   DG Chest Portable 1 View  Result Date: 08/13/2022 CLINICAL DATA:  Sepsis. EXAM: PORTABLE CHEST 1 VIEW COMPARISON:  Chest  radiograph dated 06/16/2022. FINDINGS: There is diffuse chronic interstitial coarsening and bronchitic changes. Bilateral streaky densities may be chronic or represent vascular congestion. Developing infiltrate is not excluded. No focal consolidation, pleural effusion or pneumothorax. The cardiac silhouette is within normal limits. Atherosclerotic calcification of the aorta. No acute osseous pathology. IMPRESSION: Chronic interstitial coarsening and bronchitic changes. Developing infiltrate is not excluded. Electronically Signed   By: Anner Crete M.D.   On: 08/13/2022 18:11    Cardiac Studies   Echo pending   Patient Profile     88 y.o. female with a hx of atrial fib followed by [name], last seen 08/2021 also EF 20-25% NICM, delusional disorder, DM-2, GERD now admitted with sepsis who is being seen for the evaluation of atrial fib at the request of Dr. Armandina Gemma.  Assessment & Plan    Paroxysmal Atrial Fibrillation  - Note patient was admitted in 05/2021 with pneumonia, found to have A-fib with RVR.  She was started on Eliquis 2.5 twice daily, underwent cardioversion in 05/2021.  More recently admitted in 06/2022 with pneumonia, maintained normal sinus rhythm that admission - Now admitted with shortness of breath. Found to be in afib with RVR - Started on IV amiodarone yesterday, continues to be tachycardic with HR in the 110s-120s  -Home metoprolol held due to soft BP, resume as BP tolerates  - Continue Eliquis 2.5 mg twice daily (dose reduced due to age, renal function) - Echo pending - Patient reports excellent compliance with eliquis-- her daughter [name] gives it to her twice daily. Called [name] to confirm, who also reports patient has been compliant with Eliquis. Denies missing any doses. Consider DCCV  - Ordered TSH (was within normal limits in 8/23)   Chronic systolic heart failure - Most recent echocardiogram completed 04/2021 showed EF 20-25%, global hypokinesis, normal RV  systolic function - Echo this admission pending - Chest x-ray with chronic interstitial coarsening, bronchiectatic changes.  Developing infiltrate not excluded -Home metoprolol held due to soft BP.  Not on ACE/ARB/ARNI or [place] given CKD -Home Lasix held given AKI on CKD stage IIIb - Low threshold to resume lasix given sob, history of CHF. OK to hold for now   Hypotension - BP soft this admission, was 92/66 on admission. 98/70 this AM  - Holding home BP medications  - Patient has received IV fluids - Suspect BP will improve with HR control  AKI on CKD stage IIIb -Creatinine elevated to 2.42 on admission, baseline approximately 1.3 -Patient was given IV fluids in ER.  Now held given patient's history of systolic heart failure. -Creatinine further increased to 2.53 this a.m. Possibly prerenal in the setting of hypotension, A-fib with RVR      For questions or updates, please contact [place] Please consult [email or web address] for contact info under        Signed, Margie Billet, PA-C  08/14/2022, 6:44 AM

## 2022-08-15 ENCOUNTER — Encounter (HOSPITAL_COMMUNITY): Payer: Self-pay | Admitting: Internal Medicine

## 2022-08-15 DIAGNOSIS — I480 Paroxysmal atrial fibrillation: Secondary | ICD-10-CM | POA: Diagnosis not present

## 2022-08-15 LAB — CBC WITH DIFFERENTIAL/PLATELET
Abs Immature Granulocytes: 0.08 10*3/uL — ABNORMAL HIGH (ref 0.00–0.07)
Basophils Absolute: 0 10*3/uL (ref 0.0–0.1)
Basophils Relative: 0 %
Eosinophils Absolute: 0.1 10*3/uL (ref 0.0–0.5)
Eosinophils Relative: 1 %
HCT: 32.3 % — ABNORMAL LOW (ref 36.0–46.0)
Hemoglobin: 9.8 g/dL — ABNORMAL LOW (ref 12.0–15.0)
Immature Granulocytes: 1 %
Lymphocytes Relative: 10 %
Lymphs Abs: 1.2 10*3/uL (ref 0.7–4.0)
MCH: 27 pg (ref 26.0–34.0)
MCHC: 30.3 g/dL (ref 30.0–36.0)
MCV: 89 fL (ref 80.0–100.0)
Monocytes Absolute: 0.9 10*3/uL (ref 0.1–1.0)
Monocytes Relative: 7 %
Neutro Abs: 9.5 10*3/uL — ABNORMAL HIGH (ref 1.7–7.7)
Neutrophils Relative %: 81 %
Platelets: 193 10*3/uL (ref 150–400)
RBC: 3.63 MIL/uL — ABNORMAL LOW (ref 3.87–5.11)
RDW: 17.5 % — ABNORMAL HIGH (ref 11.5–15.5)
WBC: 11.8 10*3/uL — ABNORMAL HIGH (ref 4.0–10.5)
nRBC: 0 % (ref 0.0–0.2)

## 2022-08-15 LAB — GLUCOSE, CAPILLARY
Glucose-Capillary: 138 mg/dL — ABNORMAL HIGH (ref 70–99)
Glucose-Capillary: 221 mg/dL — ABNORMAL HIGH (ref 70–99)
Glucose-Capillary: 163 mg/dL — ABNORMAL HIGH (ref 70–99)
Glucose-Capillary: 104 mg/dL — ABNORMAL HIGH (ref 70–99)

## 2022-08-15 LAB — COMPREHENSIVE METABOLIC PANEL
ALT: 11 U/L (ref 0–44)
AST: 12 U/L — ABNORMAL LOW (ref 15–41)
Albumin: 3.1 g/dL — ABNORMAL LOW (ref 3.5–5.0)
Alkaline Phosphatase: 61 U/L (ref 38–126)
Anion gap: 12 (ref 5–15)
BUN: 38 mg/dL — ABNORMAL HIGH (ref 8–23)
CO2: 23 mmol/L (ref 22–32)
Calcium: 8.4 mg/dL — ABNORMAL LOW (ref 8.9–10.3)
Chloride: 104 mmol/L (ref 98–111)
Creatinine, Ser: 2.55 mg/dL — ABNORMAL HIGH (ref 0.44–1.00)
GFR, Estimated: 17 mL/min — ABNORMAL LOW (ref >60)
Glucose, Bld: 108 mg/dL — ABNORMAL HIGH (ref 70–99)
Potassium: 4.2 mmol/L (ref 3.5–5.1)
Sodium: 139 mmol/L (ref 135–145)
Total Bilirubin: 0.5 mg/dL (ref 0.3–1.2)
Total Protein: 6.5 g/dL (ref 6.5–8.1)

## 2022-08-15 LAB — MAGNESIUM: Magnesium: 1.3 mg/dL — ABNORMAL LOW (ref 1.7–2.4)

## 2022-08-15 LAB — PHOSPHORUS: Phosphorus: 4.7 mg/dL — ABNORMAL HIGH (ref 2.5–4.6)

## 2022-08-15 MED ORDER — MAGNESIUM SULFATE 4 GM/100ML IV SOLN
4.0000 g | Freq: Once | INTRAVENOUS | Status: AC
  Administered 2022-08-15: 4 g via INTRAVENOUS
  Filled 2022-08-15: qty 100

## 2022-08-15 MED ORDER — AMIODARONE HCL 200 MG PO TABS
200.0000 mg | ORAL_TABLET | Freq: Two times a day (BID) | ORAL | Status: DC
  Administered 2022-08-15 – 2022-08-16 (×4): 200 mg via ORAL
  Filled 2022-08-15 (×4): qty 1

## 2022-08-15 MED ORDER — MAGNESIUM SULFATE 2 GM/50ML IV SOLN: 2.0000 g | Freq: Once | INTRAVENOUS | Status: DC

## 2022-08-15 MED ORDER — MAGNESIUM SULFATE IN D5W 1-5 GM/100ML-% IV SOLN
1.0000 g | Freq: Once | INTRAVENOUS | Status: AC
  Administered 2022-08-15: 1 g via INTRAVENOUS
  Filled 2022-08-15: qty 100

## 2022-08-15 NOTE — Progress Notes (Addendum)
Rounding Note    Patient Name: Melissa [name] Date of Encounter: 08/15/2022  [place] Cardiologist: [name], MD   Subjective   Patient is feeling better this AM. Denies chest pain, palpitations. Breathing significantly improved   Per telemetry, patient converted to NSR at 0221   Inpatient Medications    Scheduled Meds:  apixaban  2.5 mg Oral BID   FLUoxetine  20 mg Oral Q breakfast   insulin aspart  0-9 Units Subcutaneous TID WC   insulin detemir  12 Units Subcutaneous Daily   levothyroxine  50 mcg Oral QAC breakfast   pramipexole  0.5 mg Oral QHS   QUEtiapine  100 mg Oral QHS   sodium chloride flush  3 mL Intravenous Q12H   Continuous Infusions:  amiodarone 30 mg/hr (08/15/22 0400)   ampicillin-sulbactam (UNASYN) IV Stopped (08/14/22 2102)   magnesium sulfate bolus IVPB     PRN Meds: acetaminophen **OR** acetaminophen, guaiFENesin-dextromethorphan, ondansetron **OR** ondansetron (ZOFRAN) IV, senna-docusate   Vital Signs    Vitals:   08/15/22 0400 08/15/22 0600 08/15/22 0610 08/15/22 0700  BP: 102/69  115/82 107/88  Pulse: 87  85 84  Resp: (!) 21  (!) 22 (!) 22  Temp: 98.1 F (36.7 C)   98.6 F (37 C)  TempSrc: Oral   Oral  SpO2: 98%  98% 95%  Weight:  68.7 kg    Height:        Intake/Output Summary (Last 24 hours) at 08/15/2022 0753 Last data filed at 08/15/2022 0700 Gross per 24 hour  Intake 1557.01 ml  Output --  Net 1557.01 ml      08/15/2022    6:00 AM 08/13/2022    5:37 PM 06/21/2022    5:00 AM  Last 3 Weights  Weight (lbs) 151 lb 7.3 oz 140 lb 143 lb 15.4 oz  Weight (kg) 68.7 kg 63.504 kg 65.3 kg      Telemetry    Patient converted to NSR this AM at 0221, HR has been in the 80s since - Personally Reviewed  ECG    No new tracings since 10/31 - Personally Reviewed  Physical Exam   GEN: No acute distress. Sitting comfortably in the chair. Wearing [place]  Neck: No JVD Cardiac: RRR, no murmurs, rubs, or gallops.   Respiratory: Crackles throughout, normal WOB on [place] with 3 L supplemental oxygen  GI: Soft, nontender, non-distended  MS: No edema; No deformity. Neuro:  Nonfocal  Psych: Normal affect   Labs    High Sensitivity Troponin:   Recent Labs  Lab 08/13/22 1957  TROPONINIHS 12     Chemistry Recent Labs  Lab 08/13/22 1740 08/13/22 1957 08/14/22 0518 08/15/22 0024  NA 140  --  139 139  K 3.9  --  4.5 4.2  CL 105  --  105 104  CO2 22  --  21* 23  GLUCOSE 175*  --  226* 108*  BUN 37*  --  36* 38*  CREATININE 2.42*  --  2.53* 2.55*  CALCIUM 8.5*  --  7.9* 8.4*  MG  --  1.2* 1.1* 1.3*  PROT 6.7  --  6.2* 6.5  ALBUMIN 3.2*  --  2.9* 3.1*  AST 12*  --  13* 12*  ALT 8  --  9 11  ALKPHOS 68  --  62 61  BILITOT 0.4  --  0.4 0.5  GFRNONAA 18*  --  17* 17*  ANIONGAP 13  --  13 12  Lipids No results for input(s): "CHOL", "TRIG", "HDL", "LABVLDL", "LDLCALC", "CHOLHDL" in the last 168 hours.  Hematology Recent Labs  Lab 08/13/22 1740 08/14/22 0518 08/15/22 0024  WBC 7.0 7.1 11.8*  RBC 3.62* 3.48* 3.63*  HGB 10.1* 9.5* 9.8*  HCT 31.9* 31.0* 32.3*  MCV 88.1 89.1 89.0  MCH 27.9 27.3 27.0  MCHC 31.7 30.6 30.3  RDW 17.3* 17.4* 17.5*  PLT 182 178 193   Thyroid No results for input(s): "TSH", "FREET4" in the last 168 hours.  BNPNo results for input(s): "BNP", "PROBNP" in the last 168 hours.  DDimer No results for input(s): "DDIMER" in the last 168 hours.   Radiology    ECHOCARDIOGRAM COMPLETE  Result Date: 08/14/2022    ECHOCARDIOGRAM REPORT   Patient Name:   Melissa [name] Date of Exam: 08/14/2022 Medical Rec #:  [id number]     Height:       64.0 in Accession #:    [id number]    Weight:       140.0 lb Date of Birth:  [birth year]     BSA:          1.681 m Patient Age:    88 years      BP:           81/71 mmHg Patient Gender: F             HR:           121 bpm. Exam Location:  Inpatient Procedure: 2D Echo, Color Doppler and Cardiac Doppler Indications:    Atrial fibrillation   History:        Patient has prior history of Echocardiogram examinations, most                 recent 04/13/2021. HFrEF and Cardiomyopathy, Arrythmias:Atrial                 Fibrillation, Signs/Symptoms:Dyspnea and Fatigue; Risk                 Factors:Diabetes, Hypertension and Dyslipidemia. CKD.  Sonographer:    Eartha Inch Referring Phys: [id number] VISHAL R PATEL  Sonographer Comments: Technically difficult study due to poor echo windows. Image acquisition challenging due to patient body habitus and Image acquisition challenging due to respiratory motion. IMPRESSIONS  1. Left ventricular ejection fraction, by estimation, is 25%. The left ventricle has severely decreased function. The left ventricle demonstrates global hypokinesis. Left ventricular diastolic parameters are indeterminate.  2. Right ventricular systolic function is moderately reduced. The right ventricular size is normal. There is normal pulmonary artery systolic pressure. The estimated right ventricular systolic pressure is 62.9 mmHg.  3. Left atrial size was moderately dilated.  4. The mitral valve is abnormal with restricted posterior leaflet. Moderate mitral valve regurgitation. No evidence of mitral stenosis.  5. Tricuspid valve regurgitation is mild to moderate.  6. The aortic valve is tricuspid. Aortic valve regurgitation is not visualized. No aortic stenosis is present.  7. The inferior vena cava is normal in size with <50% respiratory variability, suggesting right atrial pressure of 8 mmHg.  8. The patient was in atrial fibrillation. FINDINGS  Left Ventricle: Left ventricular ejection fraction, by estimation, is 25%. The left ventricle has severely decreased function. The left ventricle demonstrates global hypokinesis. The left ventricular internal cavity size was normal in size. There is no left ventricular hypertrophy. Left ventricular diastolic parameters are indeterminate. Right Ventricle: The right ventricular size is normal. No  increase in right ventricular wall thickness. Right  ventricular systolic function is moderately reduced. There is normal pulmonary artery systolic pressure. The tricuspid regurgitant velocity is 2.24 m/s, and with an assumed right atrial pressure of 8 mmHg, the estimated right ventricular systolic pressure is 16.0 mmHg. Left Atrium: Left atrial size was moderately dilated. Right Atrium: Right atrial size was normal in size. Pericardium: Trivial pericardial effusion is present. Mitral Valve: The mitral valve is abnormal. Moderate mitral valve regurgitation. No evidence of mitral valve stenosis. MV peak gradient, 10.4 mmHg. The mean mitral valve gradient is 6.0 mmHg. Tricuspid Valve: The tricuspid valve is normal in structure. Tricuspid valve regurgitation is mild to moderate. Aortic Valve: The aortic valve is tricuspid. Aortic valve regurgitation is not visualized. No aortic stenosis is present. Pulmonic Valve: The pulmonic valve was normal in structure. Pulmonic valve regurgitation is trivial. Aorta: The aortic root is normal in size and structure. Venous: The inferior vena cava is normal in size with less than 50% respiratory variability, suggesting right atrial pressure of 8 mmHg. IAS/Shunts: No atrial level shunt detected by color flow Doppler.  LEFT VENTRICLE PLAX 2D LVIDd:         5.30 cm      Diastology LVIDs:         5.10 cm      LV e' medial:   7.38 cm/s LV PW:         1.00 cm      LV E/e' medial: 18.0 LV IVS:        0.70 cm LVOT diam:     1.80 cm LVOT Area:     2.54 cm  LV Volumes (MOD) LV vol d, MOD A2C: 114.0 ml LV vol d, MOD A4C: 97.4 ml LV vol s, MOD A2C: 72.3 ml LV vol s, MOD A4C: 74.7 ml LV SV MOD A2C:     41.7 ml LV SV MOD A4C:     97.4 ml LV SV MOD BP:      31.8 ml RIGHT VENTRICLE         IVC TAPSE (M-mode): 1.1 cm  IVC diam: 2.00 cm LEFT ATRIUM             Index        RIGHT ATRIUM           Index LA diam:        3.90 cm 2.32 cm/m   RA Area:     12.70 cm LA Vol (A2C):   80.5 ml 47.88 ml/m  RA  Volume:   27.10 ml  16.12 ml/m LA Vol (A4C):   73.0 ml 43.42 ml/m LA Biplane Vol: 80.7 ml 48.00 ml/m   AORTA Ao Root diam: 3.10 cm Ao Asc diam:  3.00 cm MITRAL VALVE                TRICUSPID VALVE MV Area (PHT): 3.61 cm     TR Peak grad:   20.1 mmHg MV Peak grad:  10.4 mmHg    TR Vmax:        224.00 cm/s MV Mean grad:  6.0 mmHg MV Vmax:       1.61 m/s     SHUNTS MV Vmean:      119.0 cm/s   Systemic Diam: 1.80 cm MV Decel Time: 210 msec MR Peak grad: 54.2 mmHg MR Mean grad: 40.0 mmHg MR Vmax:      368.00 cm/s MR Vmean:     307.0 cm/s MV E velocity: 133.00 cm/s [employer] Electronically signed by Kirk Ruths  McleanMD Signature Date/Time: 08/14/2022/2:42:15 PM    Final    DG CHEST PORT 1 VIEW  Result Date: 08/14/2022 CLINICAL DATA:  Shortness of breath. EXAM: PORTABLE CHEST 1 VIEW COMPARISON:  Chest x-ray from yesterday. FINDINGS: The heart size and mediastinal contours are within normal limits. Normal pulmonary vascularity. Increasing interstitial markings in the left mid lung and right lung base. No focal consolidation, pleural effusion, or pneumothorax. No acute osseous abnormality. Prior right proximal humerus ORIF. IMPRESSION: 1. Increasing interstitial markings in the left mid lung and right lung base, concerning for atypical infection. Electronically Signed   By: Titus Dubin M.D.   On: 08/14/2022 10:37   US RENAL  Result Date: 08/13/2022 CLINICAL DATA:  Acute renal failure superimposed on stage III B chronic kidney disease. EXAM: RENAL / URINARY TRACT ULTRASOUND COMPLETE COMPARISON:  CT 12/31/2021 FINDINGS: Right Kidney: Renal measurements: 7.8 x 3.7 x 3.7 cm = volume: 56 mL. Increased renal parenchymal echogenicity and thinning of the parenchyma. No hydronephrosis. No evidence of renal stone or focal lesion. Left Kidney: Renal measurements: 7.9 x 4.5 x 3.2 cm = volume: 59 mL. Increased renal parenchymal echogenicity and thinning of the parenchyma. No hydronephrosis. No evidence of renal stone or  focal lesion. Bladder: Appears normal for degree of bladder distention. Other: None. IMPRESSION: 1. Bilateral renal parenchymal thinning and increased echogenicity consistent with chronic medical renal disease. 2. No obstructive uropathy or focal renal abnormality. Electronically Signed   By: Keith Rake M.D.   On: 08/13/2022 21:28   DG Chest Portable 1 View  Result Date: 08/13/2022 CLINICAL DATA:  Sepsis. EXAM: PORTABLE CHEST 1 VIEW COMPARISON:  Chest radiograph dated 06/16/2022. FINDINGS: There is diffuse chronic interstitial coarsening and bronchitic changes. Bilateral streaky densities may be chronic or represent vascular congestion. Developing infiltrate is not excluded. No focal consolidation, pleural effusion or pneumothorax. The cardiac silhouette is within normal limits. Atherosclerotic calcification of the aorta. No acute osseous pathology. IMPRESSION: Chronic interstitial coarsening and bronchitic changes. Developing infiltrate is not excluded. Electronically Signed   By: Anner Crete M.D.   On: 08/13/2022 18:11    Cardiac Studies   Echocardiogram 08/14/22 1. Left ventricular ejection fraction, by estimation, is 25%. The left  ventricle has severely decreased function. The left ventricle demonstrates  global hypokinesis. Left ventricular diastolic parameters are  indeterminate.   2. Right ventricular systolic function is moderately reduced. The right  ventricular size is normal. There is normal pulmonary artery systolic  pressure. The estimated right ventricular systolic pressure is 27.7 mmHg.   3. Left atrial size was moderately dilated.   4. The mitral valve is abnormal with restricted posterior leaflet.  Moderate mitral valve regurgitation. No evidence of mitral stenosis.   5. Tricuspid valve regurgitation is mild to moderate.   6. The aortic valve is tricuspid. Aortic valve regurgitation is not  visualized. No aortic stenosis is present.   7. The inferior vena cava is  normal in size with <50% respiratory  variability, suggesting right atrial pressure of 8 mmHg.   8. The patient was in atrial fibrillation.   Patient Profile     88 y.o. female with a hx of atrial fib followed by [name], last seen 08/2021 also EF 20-25% NICM, delusional disorder, DM-2, GERD now admitted with sepsis who is being seen for the evaluation of atrial fib at the request of Dr. Armandina Gemma.   Assessment & Plan    Paroxysmal Atrial Fibrillation  - Note patient was admitted in 05/2021 with  pneumonia, found to have A-fib with RVR.  She was started on Eliquis 2.5 twice daily, underwent cardioversion in 05/2021.  More recently admitted in 06/2022 with pneumonia, maintained normal sinus rhythm that admission - Now admitted with shortness of breath. Found to be in afib with RVR - Treated with IV amiodarone. Per telemetry, patient converted to NSR at 0221 this AM. Has been maintaining NSR since with HR in the 80s  - Transition to PO amiodarone 200 mg BID for 1 week. Then reduce dose to 200 mg daily  -Home metoprolol held due to soft BP while in Afib. Since patient converted to NSR, BP has improved but is still low end of normal.  - Continue Eliquis 2.5 mg twice daily (dose reduced due to age, renal function) - Echo this admission with EF 25%, moderately reduced RV function, moderately dilated LA, moderate mitral valve regurgitation  - Maintain K>4, mag>2 (mag was 1.3 this AM, supplementation ordered per primary team)    Chronic systolic heart failure - Most recent echocardiogram completed 04/2021 showed EF 20-25%, global hypokinesis, normal RV systolic function - Echo this admission with EF 25%, moderately reduced RV systolic function, moderate mitral valve regurgitation (restricted posterior leaflet)  - Chest x-ray with chronic interstitial coarsening, bronchiectatic changes.  Developing infiltrate not excluded -Home metoprolol held due to soft BP.  Not on ACE/ARB/ARNI or Arlyce Harman given CKD -  Home Lasix held given AKI on CKD stage IIIb - Low threshold to resume lasix given sob, history of CHF. OK to hold for now as patient is euvolemic on exam    Hypotension - BP soft this admission while in afib with RVR. BP has improved with HR control  - Holding home BP medications  - Patient has received IV fluids - Suspect BP will improve with HR control    AKI on CKD stage IIIb -Creatinine elevated to 2.42 on admission, baseline approximately 1.3 -Patient was given IV fluids in ER.  Now held given patient's history of systolic heart failure. -Creatinine further increased to 2.55 this a.m. Possibly prerenal in the setting of hypotension, A-fib with RVR. Check BMP in AM to see if renal function improves now that BP and HR have improved       For questions or updates, please contact [place] Please consult [email or web address] for contact info under        Signed, Margie Billet, PA-C  08/15/2022, 7:53 AM    Improved with conversion to NSR Change to PO amiodarone Resume coreg and lasix as BP improves MS better Notes regarding aspiration and swallowing noted Euvolemic currently   Jenkins Rouge MD [facility]

## 2022-08-15 NOTE — Evaluation (Signed)
Physical Therapy Evaluation Patient Details Name: Melissa [name] MRN: [id number] DOB: 1927-11-17 Today's Date: 08/15/2022  History of Present Illness  DEANDA RUDDELL is a 88 y.o. female who was admitted with paroxysmal atrial fibrillation with RVR. PMH: HFrEF (EF 20-25% per TTE 04/13/2021), NICM, PAF on Eliquis, CKD stage IIIb, insulin-dependent T2DM, HTN, hypothyroidism, HLD, anxiety, delusional parasitosis, mild cognitive impairment   Clinical Impression  Pt admitted with above. Per daughter pt with dementia and is near baseline cognitively but reports she is typically faster with her response time. At baseline pt amb with rollator, palliative services bathes pt 3x/wk, dtr assists occasionally with dressing and feeding when pt experiencing tremors but for the most part can do ADLs with supervision. Pt currently functioning with minA, demo's delayed response time, and impaired balance. Dtr expresses desire to continue to keep patient with her at home however is interested in having more help at home to care for her mother. Acute PT to cont to follow to progress mobility.     Recommendations for follow up therapy are one component of a multi-disciplinary discharge planning process, led by the attending physician.  Recommendations may be updated based on patient status, additional functional criteria and insurance authorization.  Follow Up Recommendations Home health PT (dtr is looking for more assist at home, palliative currently comes out 3x/wk for about 30 minutes to bath her)      Assistance Recommended at Discharge Frequent or constant Supervision/Assistance  Patient can return home with the following  A little help with walking and/or transfers;A little help with bathing/dressing/bathroom;Direct supervision/assist for medications management;Assist for transportation;Help with stairs or ramp for entrance    Equipment Recommendations None recommended by PT (has needed DME)  Recommendations for  Other Services       Functional Status Assessment Patient has had a recent decline in their functional status and demonstrates the ability to make significant improvements in function in a reasonable and predictable amount of time.     Precautions / Restrictions Precautions Precautions: Fall Precaution Comments: per daughter pt with dementia Restrictions Weight Bearing Restrictions: No      Mobility  Bed Mobility Overal bed mobility: Needs Assistance Bed Mobility: Sit to Supine       Sit to supine: Min assist   General bed mobility comments: minA for LE management up into bed, pt able to scoot self up in the bed    Transfers Overall transfer level: Needs assistance Equipment used: Rolling walker (2 wheels) Transfers: Sit to/from Stand Sit to Stand: Min assist           General transfer comment: verbal cues for safe hand placement, increased time, minA to power up and steady during transition of hands    Ambulation/Gait Ambulation/Gait assistance: Min assist, +2 safety/equipment (line management and chair follow) Gait Distance (Feet): 50 Feet (x2) Assistive device: Rolling walker (2 wheels) Gait Pattern/deviations: Step-through pattern, Decreased stride length, Trunk flexed Gait velocity: dec Gait velocity interpretation: <1.31 ft/sec, indicative of household ambulator   General Gait Details: short step height and length but no obvious LOB  Stairs            Wheelchair Mobility    Modified Rankin (Stroke Patients Only)       Balance Overall balance assessment: Needs assistance Sitting-balance support: Feet supported, No upper extremity supported Sitting balance-Leahy Scale: Fair     Standing balance support: Bilateral upper extremity supported, During functional activity, Reliant on assistive device for balance Standing balance-Leahy Scale: Poor Standing  balance comment: requires use of RW                             Pertinent  Vitals/Pain Pain Assessment Pain Assessment: No/denies pain    Home Living Family/patient expects to be discharged to:: Private residence Living Arrangements: Children Available Help at Discharge: Family;Available 24 hours/day Type of Home: Apartment Home Access: Level entry       Home Layout: One level Home Equipment: Rollator (4 wheels);Cane - single point;Tub bench;Grab bars - tub/shower      Prior Function Prior Level of Function : Needs assist             Mobility Comments: pt ambulates in home with rollator ADLs Comments: palliative care comes in 3x/wk to bath patient, dtr cooks but pt feeds herself majority of time, pt dresses her self majority of time, dtr reports pt has tremors sometimes and then she will need to help the pt with ADLs, pt's granddaughter does her medications     Hand Dominance   Dominant Hand: Right    Extremity/Trunk Assessment   Upper Extremity Assessment Upper Extremity Assessment: Generalized weakness    Lower Extremity Assessment Lower Extremity Assessment: Generalized weakness    Cervical / Trunk Assessment Cervical / Trunk Assessment: Kyphotic  Communication   Communication: Expressive difficulties (delayed response time)  Cognition Arousal/Alertness: Awake/alert Behavior During Therapy: Flat affect Overall Cognitive Status: History of cognitive impairments - at baseline                                 General Comments: per dtr pt with dementia, typically doesn't know date. Pt with very delayed response but able to follow all simple commands        General Comments General comments (skin integrity, edema, etc.): VSS, BP did drop to 90s/50s upon return from ambulation    Exercises     Assessment/Plan    PT Assessment Patient needs continued PT services  PT Problem List Decreased strength;Decreased activity tolerance;Decreased balance;Decreased mobility;Decreased coordination;Decreased knowledge of use of  DME;Decreased cognition;Decreased safety awareness       PT Treatment Interventions DME instruction;Gait training;Functional mobility training;Therapeutic activities;Therapeutic exercise;Balance training;Neuromuscular re-education    PT Goals (Current goals can be found in the Care Plan section)  Acute Rehab PT Goals Patient Stated Goal: didn't state PT Goal Formulation: With patient/family Time For Goal Achievement: 08/29/22 Potential to Achieve Goals: Good    Frequency Min 3X/week     Co-evaluation               AM-PAC PT "6 Clicks" Mobility  Outcome Measure Help needed turning from your back to your side while in a flat bed without using bedrails?: A Little Help needed moving from lying on your back to sitting on the side of a flat bed without using bedrails?: A Little Help needed moving to and from a bed to a chair (including a wheelchair)?: A Little Help needed standing up from a chair using your arms (e.g., wheelchair or bedside chair)?: A Little Help needed to walk in hospital room?: A Little Help needed climbing 3-5 steps with a railing? : A Lot 6 Click Score: 17    End of Session Equipment Utilized During Treatment: Gait belt Activity Tolerance: Patient tolerated treatment well Patient left: in bed;with call bell/phone within reach;with bed alarm set;with family/visitor present Nurse Communication: Mobility status PT  Visit Diagnosis: Unsteadiness on feet (R26.81);Muscle weakness (generalized) (M62.81);Difficulty in walking, not elsewhere classified (R26.2)    Time: 6073-7106 PT Time Calculation (min) (ACUTE ONLY): 27 min   Charges:   PT Evaluation $PT Eval Moderate Complexity: 1 Mod PT Treatments $Gait Training: 8-22 mins       Kittie Plater, PT, DPT Acute Rehabilitation Services Secure chat preferred Office #: [phone]   Berline Lopes 08/15/2022, 12:16 PM

## 2022-08-15 NOTE — Progress Notes (Signed)
PROGRESS NOTE    Melissa [name]  YSA:[id number] DOB: 1928-08-04 DOA: 08/13/2022 PCP: Darreld Mclean, MD  Chief Complaint  Patient presents with   Code Sepsis    Brief Narrative:  Melissa [name] is Melissa [name] 88 y.o. female with medical history significant for HFrEF (EF 20-25% per TTE 04/13/2021), NICM, PAF on Eliquis, CKD stage IIIb, insulin-dependent T2DM, HTN, hypothyroidism, HLD, anxiety, delusional parasitosis, mild cognitive impairment who is admitted with paroxysmal atrial fibrillation with RVR.  Seen by cardiology and started on IV amiodarone drip.    Assessment & Plan:   Principal Problem:   Paroxysmal atrial fibrillation with rapid ventricular response (HCC) Active Problems:   Acute renal failure superimposed on stage 3b chronic kidney disease (HCC)   Hypotension   Chronic HFrEF (heart failure with reduced ejection fraction) ([place])   Type 2 diabetes mellitus with diabetic neuropathy, with long-term current use of insulin (HCC)   Hypothyroidism, acquired   Assessment and Plan: Paroxysmal atrial fibrillation with rapid ventricular response ([place]) Presenting with Melissa [name]-fib with RVR, rate 140s.  Blood pressure soft.  Cardiology consulted and started IV amiodarone drip.  Tachycardia, tachypnea, hypotension all likely driven by Melissa [name]-fib with RVR.  No sign of active infection.  Sepsis not ruled in. -Cardiology following -Continue IV amiodarone per cardiology -> transitioning to PO today -Update echocardiogram -Keep on telemetry -Hold Toprol-XL with soft blood pressure -Continue Eliquis 2.5 mg twice daily -Check magnesium and TSH   Hypotension Suspect due to Melissa [name]-fib with RVR, hypovolemia?  Given 1 L NS in the ED.  SBP improved at this time.  Possible contribution from infection with pneumonia.   -abx with unasyn as below -Follow blood cultures -Hold antihypertensives - BP on soft side last night again  Acute renal failure superimposed on stage 3b chronic kidney disease (HCC) Creatinine 2.42  on admit compared to baseline around 1.3.  Suspected prerenal from hypoperfusion in setting of Melissa [name]-fib with RVR, hypovolemia, hypotension? -creatinine relatively stable today, remains elevated, continue to monitor -S/p 1 L NS in the ED -Hold further fluids given HFrEF -UA with negative protein, 0-5 RBC's -> fena suggestive of intrinsic dz (urine urea nitrogen pending for FeUrea given diuretic use) -Renal ultrasound with chronic medical renal disease -Monitor UOP   Concern for Aspiration  Abnormal CXR - noted while cards in room this AM - repeat CXR concerning for atypical infection, will add unasyn - mild leukocytosis today - SLP eval -> mild aspiration risk, dysphagia 3/thin liquid diet recommended  Chronic HFrEF (heart failure with reduced ejection fraction) ([place]) EF 20-25% by last TTE 04/13/2021.  Appears euvolemic but may have some mild pulm edema on CXR.  Received 1 L NS in the ED for hypotension.  Has AKI on CKD stage IIIb. -Obtain echocardiogram -> EF 25%, global hypokinesis, moderately reduced RVSF (see report) -Hold Lasix for now, monitor strict I/O's and daily weights -Hold further fluids -Hold Toprol-XL given hypotension -Not currently on ACE/ARB/Spiro; would not add given AKI -Cardiology following   Type 2 diabetes mellitus with diabetic neuropathy, with long-term current use of insulin ([place]) Placed on SSI.  Reduced dose basal ordered. A1c 8/5 (05/2022)   Hypothyroidism, acquired Continue home dose Synthroid 50 mcg daily.  Check TSH (pending).   Hypomagnesemia Replace and follow     DVT prophylaxis: eliquis Code Status: full Family Communication: none at bedside - called daughter, no answer 11/1 Disposition:   Status is: Inpatient Remains inpatient appropriate because: continued need for inpatient management   Consultants:  cardiology  Procedures:  Echo IMPRESSIONS     1. Left ventricular ejection fraction, by estimation, is 25%. The left  ventricle has  severely decreased function. The left ventricle demonstrates  global hypokinesis. Left ventricular diastolic parameters are  indeterminate.   2. Right ventricular systolic function is moderately reduced. The right  ventricular size is normal. There is normal pulmonary artery systolic  pressure. The estimated right ventricular systolic pressure is 12.7 mmHg.   3. Left atrial size was moderately dilated.   4. The mitral valve is abnormal with restricted posterior leaflet.  Moderate mitral valve regurgitation. No evidence of mitral stenosis.   5. Tricuspid valve regurgitation is mild to moderate.   6. The aortic valve is tricuspid. Aortic valve regurgitation is not  visualized. No aortic stenosis is present.   7. The inferior vena cava is normal in size with <50% respiratory  variability, suggesting right atrial pressure of 8 mmHg.   8. The patient was in atrial fibrillation.    Antimicrobials:  Anti-infectives (From admission, onward)    Start     Dose/Rate Route Frequency Ordered Stop   08/14/22 1915  ampicillin-sulbactam (UNASYN) 1.5 g in sodium chloride 0.9 % 100 mL IVPB        1.5 g 200 mL/hr over 30 Minutes Intravenous Every 12 hours 08/14/22 1817     08/13/22 1923  vancomycin variable dose per unstable renal function (pharmacist dosing)  Status:  Discontinued         Does not apply See admin instructions 08/13/22 1924 08/13/22 2105   08/13/22 1830  vancomycin (VANCOREADY) IVPB 1250 mg/250 mL        1,250 mg 166.7 mL/hr over 90 Minutes Intravenous  Once 08/13/22 1828 08/13/22 2040   08/13/22 1815  ceFEPIme (MAXIPIME) 2 g in sodium chloride 0.9 % 100 mL IVPB        2 g 200 mL/hr over 30 Minutes Intravenous  Once 08/13/22 1804 08/13/22 1844       Subjective: Feeling better this AM  Objective: Vitals:   08/15/22 0400 08/15/22 0600 08/15/22 0610 08/15/22 0700  BP: 102/69  115/82 107/88  Pulse: 87  85 84  Resp: (!) 21  (!) 22 (!) 22  Temp: 98.1 F (36.7 C)   98.6 F (37  C)  TempSrc: Oral   Oral  SpO2: 98%  98% 95%  Weight:  68.7 kg    Height:        Intake/Output Summary (Last 24 hours) at 08/15/2022 0827 Last data filed at 08/15/2022 0700 Gross per 24 hour  Intake 1557.01 ml  Output --  Net 1557.01 ml   Filed Weights   08/13/22 1737 08/15/22 0600  Weight: 63.5 kg 68.7 kg    Examination:  General: No acute distress. Cardiovascular: RRR Lungs: bibasilar crackles, unlabored on 3 L [place] Neurological: Alert and oriented 3. Moves all extremities 4 with equal strength. Cranial nerves II through XII grossly intact. Extremities: No clubbing or cyanosis. No edema   Data Reviewed: I have personally reviewed following labs and imaging studies  CBC: Recent Labs  Lab 08/13/22 1740 08/14/22 0518 08/15/22 0024  WBC 7.0 7.1 11.8*  NEUTROABS 4.6  --  9.5*  HGB 10.1* 9.5* 9.8*  HCT 31.9* 31.0* 32.3*  MCV 88.1 89.1 89.0  PLT 182 178 517    Basic Metabolic Panel: Recent Labs  Lab 08/13/22 1740 08/13/22 1957 08/14/22 0518 08/15/22 0024  NA 140  --  139 139  K 3.9  --  4.5 4.2  CL 105  --  105 104  CO2 22  --  21* 23  GLUCOSE 175*  --  226* 108*  BUN 37*  --  36* 38*  CREATININE 2.42*  --  2.53* 2.55*  CALCIUM 8.5*  --  7.9* 8.4*  MG  --  1.2* 1.1* 1.3*  PHOS  --   --   --  4.7*    GFR: Estimated Creatinine Clearance: 13.4 mL/min (Belinda Schlichting) (by C-G formula based on SCr of 2.55 mg/dL (H)).  Liver Function Tests: Recent Labs  Lab 08/13/22 1740 08/14/22 0518 08/15/22 0024  AST 12* 13* 12*  ALT '8 9 11  '$ ALKPHOS 68 62 61  BILITOT 0.4 0.4 0.5  PROT 6.7 6.2* 6.5  ALBUMIN 3.2* 2.9* 3.1*    CBG: Recent Labs  Lab 08/14/22 0813 08/14/22 1207 08/14/22 1643 08/14/22 2104 08/15/22 0609  GLUCAP 231* 164* 122* 84 138*     Recent Results (from the past 240 hour(s))  Culture, blood (Routine x 2)     Status: None (Preliminary result)   Collection Time: 08/13/22  5:40 PM   Specimen: BLOOD  Result Value Ref Range Status   Specimen  Description BLOOD RIGHT ANTECUBITAL  Final   Special Requests   Final    BOTTLES DRAWN AEROBIC AND ANAEROBIC Blood Culture adequate volume   Culture   Final    NO GROWTH 2 DAYS Performed at [facility], [place] [address] [place], [place] [address]    Report Status PENDING  Incomplete  Culture, blood (Routine x 2)     Status: None (Preliminary result)   Collection Time: 08/13/22  5:50 PM   Specimen: BLOOD RIGHT HAND  Result Value Ref Range Status   Specimen Description BLOOD RIGHT HAND  Final   Special Requests   Final    BOTTLES DRAWN AEROBIC AND ANAEROBIC Blood Culture results may not be optimal due to an inadequate volume of blood received in culture bottles   Culture   Final    NO GROWTH 2 DAYS Performed at [facility], [place] [address], [place], [place] [address]    Report Status PENDING  Incomplete  MRSA Next Gen by PCR, Nasal     Status: None   Collection Time: 08/13/22  6:29 PM   Specimen: Nasal Mucosa; Nasal Swab  Result Value Ref Range Status   MRSA by PCR Next Gen NOT DETECTED NOT DETECTED Final    Comment: (NOTE) The GeneXpert MRSA Assay (FDA approved for NASAL specimens only), is one component of Jaylenne Hamelin comprehensive MRSA colonization surveillance program. It is not intended to diagnose MRSA infection nor to guide or monitor treatment for MRSA infections. Test performance is not FDA approved in patients less than 50 years old. Performed at [facility], [place] [address], [place], [place] [address]          Radiology Studies: ECHOCARDIOGRAM COMPLETE  Result Date: 08/14/2022    ECHOCARDIOGRAM REPORT   Patient Name:   ESTEFANA TAYLOR Date of Exam: 08/14/2022 Medical Rec #:  [id number]     Height:       64.0 in Accession #:    [id number]    Weight:       140.0 lb Date of Birth:  1927-12-15     BSA:          1.681 m Patient Age:    61 years      BP:  81/71 mmHg Patient Gender: F             HR:           121 bpm. Exam Location:  Inpatient Procedure: 2D  Echo, Color Doppler and Cardiac Doppler Indications:    Atrial fibrillation  History:        Patient has prior history of Echocardiogram examinations, most                 recent 04/13/2021. HFrEF and Cardiomyopathy, Arrythmias:Atrial                 Fibrillation, Signs/Symptoms:Dyspnea and Fatigue; Risk                 Factors:Diabetes, Hypertension and Dyslipidemia. CKD.  Sonographer:    Eartha Inch Referring Phys: [id number] VISHAL R PATEL  Sonographer Comments: Technically difficult study due to poor echo windows. Image acquisition challenging due to patient body habitus and Image acquisition challenging due to respiratory motion. IMPRESSIONS  1. Left ventricular ejection fraction, by estimation, is 25%. The left ventricle has severely decreased function. The left ventricle demonstrates global hypokinesis. Left ventricular diastolic parameters are indeterminate.  2. Right ventricular systolic function is moderately reduced. The right ventricular size is normal. There is normal pulmonary artery systolic pressure. The estimated right ventricular systolic pressure is 45.4 mmHg.  3. Left atrial size was moderately dilated.  4. The mitral valve is abnormal with restricted posterior leaflet. Moderate mitral valve regurgitation. No evidence of mitral stenosis.  5. Tricuspid valve regurgitation is mild to moderate.  6. The aortic valve is tricuspid. Aortic valve regurgitation is not visualized. No aortic stenosis is present.  7. The inferior vena cava is normal in size with <50% respiratory variability, suggesting right atrial pressure of 8 mmHg.  8. The patient was in atrial fibrillation. FINDINGS  Left Ventricle: Left ventricular ejection fraction, by estimation, is 25%. The left ventricle has severely decreased function. The left ventricle demonstrates global hypokinesis. The left ventricular internal cavity size was normal in size. There is no left ventricular hypertrophy. Left ventricular diastolic parameters are  indeterminate. Right Ventricle: The right ventricular size is normal. No increase in right ventricular wall thickness. Right ventricular systolic function is moderately reduced. There is normal pulmonary artery systolic pressure. The tricuspid regurgitant velocity is 2.24 m/s, and with an assumed right atrial pressure of 8 mmHg, the estimated right ventricular systolic pressure is 09.8 mmHg. Left Atrium: Left atrial size was moderately dilated. Right Atrium: Right atrial size was normal in size. Pericardium: Trivial pericardial effusion is present. Mitral Valve: The mitral valve is abnormal. Moderate mitral valve regurgitation. No evidence of mitral valve stenosis. MV peak gradient, 10.4 mmHg. The mean mitral valve gradient is 6.0 mmHg. Tricuspid Valve: The tricuspid valve is normal in structure. Tricuspid valve regurgitation is mild to moderate. Aortic Valve: The aortic valve is tricuspid. Aortic valve regurgitation is not visualized. No aortic stenosis is present. Pulmonic Valve: The pulmonic valve was normal in structure. Pulmonic valve regurgitation is trivial. Aorta: The aortic root is normal in size and structure. Venous: The inferior vena cava is normal in size with less than 50% respiratory variability, suggesting right atrial pressure of 8 mmHg. IAS/Shunts: No atrial level shunt detected by color flow Doppler.  LEFT VENTRICLE PLAX 2D LVIDd:         5.30 cm      Diastology LVIDs:         5.10 cm      LV  e' medial:   7.38 cm/s LV PW:         1.00 cm      LV E/e' medial: 18.0 LV IVS:        0.70 cm LVOT diam:     1.80 cm LVOT Area:     2.54 cm  LV Volumes (MOD) LV vol d, MOD A2C: 114.0 ml LV vol d, MOD A4C: 97.4 ml LV vol s, MOD A2C: 72.3 ml LV vol s, MOD A4C: 74.7 ml LV SV MOD A2C:     41.7 ml LV SV MOD A4C:     97.4 ml LV SV MOD BP:      31.8 ml RIGHT VENTRICLE         IVC TAPSE (M-mode): 1.1 cm  IVC diam: 2.00 cm LEFT ATRIUM             Index        RIGHT ATRIUM           Index LA diam:        3.90 cm 2.32  cm/m   RA Area:     12.70 cm LA Vol (A2C):   80.5 ml 47.88 ml/m  RA Volume:   27.10 ml  16.12 ml/m LA Vol (A4C):   73.0 ml 43.42 ml/m LA Biplane Vol: 80.7 ml 48.00 ml/m   AORTA Ao Root diam: 3.10 cm Ao Asc diam:  3.00 cm MITRAL VALVE                TRICUSPID VALVE MV Area (PHT): 3.61 cm     TR Peak grad:   20.1 mmHg MV Peak grad:  10.4 mmHg    TR Vmax:        224.00 cm/s MV Mean grad:  6.0 mmHg MV Vmax:       1.61 m/s     SHUNTS MV Vmean:      119.0 cm/s   Systemic Diam: 1.80 cm MV Decel Time: 210 msec MR Peak grad: 54.2 mmHg MR Mean grad: 40.0 mmHg MR Vmax:      368.00 cm/s MR Vmean:     307.0 cm/s MV E velocity: 133.00 cm/s Dalton McleanMD Electronically signed by Franki Monte Signature Date/Time: 08/14/2022/2:42:15 PM    Final    DG CHEST PORT 1 VIEW  Result Date: 08/14/2022 CLINICAL DATA:  Shortness of breath. EXAM: PORTABLE CHEST 1 VIEW COMPARISON:  Chest x-ray from yesterday. FINDINGS: The heart size and mediastinal contours are within normal limits. Normal pulmonary vascularity. Increasing interstitial markings in the left mid lung and right lung base. No focal consolidation, pleural effusion, or pneumothorax. No acute osseous abnormality. Prior right proximal humerus ORIF. IMPRESSION: 1. Increasing interstitial markings in the left mid lung and right lung base, concerning for atypical infection. Electronically Signed   By: Titus Dubin M.D.   On: 08/14/2022 10:37   US RENAL  Result Date: 08/13/2022 CLINICAL DATA:  Acute renal failure superimposed on stage III B chronic kidney disease. EXAM: RENAL / URINARY TRACT ULTRASOUND COMPLETE COMPARISON:  CT 12/31/2021 FINDINGS: Right Kidney: Renal measurements: 7.8 x 3.7 x 3.7 cm = volume: 56 mL. Increased renal parenchymal echogenicity and thinning of the parenchyma. No hydronephrosis. No evidence of renal stone or focal lesion. Left Kidney: Renal measurements: 7.9 x 4.5 x 3.2 cm = volume: 59 mL. Increased renal parenchymal echogenicity and  thinning of the parenchyma. No hydronephrosis. No evidence of renal stone or focal lesion. Bladder: Appears normal for degree of bladder distention.  Other: None. IMPRESSION: 1. Bilateral renal parenchymal thinning and increased echogenicity consistent with chronic medical renal disease. 2. No obstructive uropathy or focal renal abnormality. Electronically Signed   By: Keith Rake M.D.   On: 08/13/2022 21:28   DG Chest Portable 1 View  Result Date: 08/13/2022 CLINICAL DATA:  Sepsis. EXAM: PORTABLE CHEST 1 VIEW COMPARISON:  Chest radiograph dated 06/16/2022. FINDINGS: There is diffuse chronic interstitial coarsening and bronchitic changes. Bilateral streaky densities may be chronic or represent vascular congestion. Developing infiltrate is not excluded. No focal consolidation, pleural effusion or pneumothorax. The cardiac silhouette is within normal limits. Atherosclerotic calcification of the aorta. No acute osseous pathology. IMPRESSION: Chronic interstitial coarsening and bronchitic changes. Developing infiltrate is not excluded. Electronically Signed   By: Anner Crete M.D.   On: 08/13/2022 18:11        Scheduled Meds:  amiodarone  200 mg Oral BID   apixaban  2.5 mg Oral BID   FLUoxetine  20 mg Oral Q breakfast   insulin aspart  0-9 Units Subcutaneous TID WC   insulin detemir  12 Units Subcutaneous Daily   levothyroxine  50 mcg Oral QAC breakfast   pramipexole  0.5 mg Oral QHS   QUEtiapine  100 mg Oral QHS   sodium chloride flush  3 mL Intravenous Q12H   Continuous Infusions:  ampicillin-sulbactam (UNASYN) IV Stopped (08/14/22 2102)   magnesium sulfate bolus IVPB 1 g (08/15/22 0823)     LOS: 2 days    Time spent: over 30 min    Fayrene Helper, MD [facility]   To contact the attending provider between 7A-7P or the covering provider during after hours 7P-7A, please log into the web site [email or web address] and access using universal [place] password for that web  site. If you do not have the password, please call the hospital operator.  08/15/2022, 8:27 AM

## 2022-08-15 NOTE — Progress Notes (Signed)
Mobility Specialist Progress Note    08/15/22 1517  Mobility  Activity Transferred to/from [facility]  Level of Assistance Minimal assist, patient does 75% or more  Assistive Device Other (Comment) (HHA)  Distance Ambulated (ft) 4 ft (2+2)  Activity Response Tolerated fair  Mobility Referral Yes  $Mobility charge 1 Mobility   Post-Mobility: 81 HR  Pt received in bed. Had void. C/o fatigue. Returned to bed and encouraged pursed lip breathing. Left with call bell in reach and daughter present.    Hildred Alamin Mobility Specialist  Secure Chat Only

## 2022-08-16 ENCOUNTER — Inpatient Hospital Stay (HOSPITAL_COMMUNITY): Payer: Medicare Other

## 2022-08-16 DIAGNOSIS — I5043 Acute on chronic combined systolic (congestive) and diastolic (congestive) heart failure: Secondary | ICD-10-CM

## 2022-08-16 DIAGNOSIS — I480 Paroxysmal atrial fibrillation: Secondary | ICD-10-CM | POA: Diagnosis not present

## 2022-08-16 LAB — COMPREHENSIVE METABOLIC PANEL
ALT: 10 U/L (ref 0–44)
AST: 15 U/L (ref 15–41)
Albumin: 2.9 g/dL — ABNORMAL LOW (ref 3.5–5.0)
Alkaline Phosphatase: 55 U/L (ref 38–126)
Anion gap: 18 — ABNORMAL HIGH (ref 5–15)
BUN: 49 mg/dL — ABNORMAL HIGH (ref 8–23)
CO2: 16 mmol/L — ABNORMAL LOW (ref 22–32)
Calcium: 8.4 mg/dL — ABNORMAL LOW (ref 8.9–10.3)
Chloride: 104 mmol/L (ref 98–111)
Creatinine, Ser: 3.04 mg/dL — ABNORMAL HIGH (ref 0.44–1.00)
GFR, Estimated: 14 mL/min — ABNORMAL LOW (ref >60)
Glucose, Bld: 112 mg/dL — ABNORMAL HIGH (ref 70–99)
Potassium: 4.7 mmol/L (ref 3.5–5.1)
Sodium: 138 mmol/L (ref 135–145)
Total Bilirubin: 0.4 mg/dL (ref 0.3–1.2)
Total Protein: 6.1 g/dL — ABNORMAL LOW (ref 6.5–8.1)

## 2022-08-16 LAB — CBC WITH DIFFERENTIAL/PLATELET
Abs Immature Granulocytes: 0.04 10*3/uL (ref 0.00–0.07)
Basophils Absolute: 0.1 10*3/uL (ref 0.0–0.1)
Basophils Relative: 1 %
Eosinophils Absolute: 0.1 10*3/uL (ref 0.0–0.5)
Eosinophils Relative: 1 %
HCT: 30.6 % — ABNORMAL LOW (ref 36.0–46.0)
Hemoglobin: 9.3 g/dL — ABNORMAL LOW (ref 12.0–15.0)
Immature Granulocytes: 1 %
Lymphocytes Relative: 13 %
Lymphs Abs: 1.1 10*3/uL (ref 0.7–4.0)
MCH: 27.4 pg (ref 26.0–34.0)
MCHC: 30.4 g/dL (ref 30.0–36.0)
MCV: 90 fL (ref 80.0–100.0)
Monocytes Absolute: 1.1 10*3/uL — ABNORMAL HIGH (ref 0.1–1.0)
Monocytes Relative: 13 %
Neutro Abs: 6.2 10*3/uL (ref 1.7–7.7)
Neutrophils Relative %: 71 %
Platelets: 165 10*3/uL (ref 150–400)
RBC: 3.4 MIL/uL — ABNORMAL LOW (ref 3.87–5.11)
RDW: 17.7 % — ABNORMAL HIGH (ref 11.5–15.5)
WBC: 8.6 10*3/uL (ref 4.0–10.5)
nRBC: 0 % (ref 0.0–0.2)

## 2022-08-16 LAB — LACTIC ACID, PLASMA
Lactic Acid, Venous: 1.4 mmol/L (ref 0.5–1.9)
Lactic Acid, Venous: 1.6 mmol/L (ref 0.5–1.9)

## 2022-08-16 LAB — TSH: TSH: 3.352 u[IU]/mL (ref 0.350–4.500)

## 2022-08-16 LAB — PHOSPHORUS: Phosphorus: 4.9 mg/dL — ABNORMAL HIGH (ref 2.5–4.6)

## 2022-08-16 LAB — MAGNESIUM: Magnesium: 2.9 mg/dL — ABNORMAL HIGH (ref 1.7–2.4)

## 2022-08-16 LAB — GLUCOSE, CAPILLARY
Glucose-Capillary: 112 mg/dL — ABNORMAL HIGH (ref 70–99)
Glucose-Capillary: 147 mg/dL — ABNORMAL HIGH (ref 70–99)
Glucose-Capillary: 118 mg/dL — ABNORMAL HIGH (ref 70–99)
Glucose-Capillary: 137 mg/dL — ABNORMAL HIGH (ref 70–99)

## 2022-08-16 LAB — TROPONIN I (HIGH SENSITIVITY)
Troponin I (High Sensitivity): 14 ng/L (ref <18)
Troponin I (High Sensitivity): 13 ng/L (ref <18)

## 2022-08-16 MED ORDER — MORPHINE SULFATE (PF) 2 MG/ML IV SOLN
0.5000 mg | INTRAVENOUS | Status: DC | PRN
  Administered 2022-08-16 – 2022-08-23 (×5): 0.5 mg via INTRAVENOUS
  Filled 2022-08-16 (×6): qty 1

## 2022-08-16 MED ORDER — LACTATED RINGERS IV SOLN: INTRAVENOUS | Status: AC

## 2022-08-16 MED ORDER — ALBUTEROL SULFATE HFA 108 (90 BASE) MCG/ACT IN AERS: 2.0000 | INHALATION_SPRAY | Freq: Four times a day (QID) | RESPIRATORY_TRACT | Status: DC | PRN

## 2022-08-16 MED ORDER — AMIODARONE HCL 200 MG PO TABS: 200.0000 mg | ORAL_TABLET | Freq: Every day | ORAL | Status: DC

## 2022-08-16 MED ORDER — ALBUTEROL SULFATE (2.5 MG/3ML) 0.083% IN NEBU
2.5000 mg | INHALATION_SOLUTION | RESPIRATORY_TRACT | Status: DC | PRN
  Administered 2022-08-16 – 2022-08-21 (×3): 2.5 mg via RESPIRATORY_TRACT
  Filled 2022-08-16 (×3): qty 3

## 2022-08-16 NOTE — Progress Notes (Signed)
PROGRESS NOTE    [name]  TOI:[id number] DOB: 1928-03-17 DOA: 08/13/2022 PCP: Darreld Mclean, MD  Chief Complaint  Patient presents with   Code Sepsis    Brief Narrative:  Melissa [name] is Melissa [name] 88 y.o. female with medical history significant for HFrEF (EF 20-25% per TTE 04/13/2021), NICM, PAF on Eliquis, CKD stage IIIb, insulin-dependent T2DM, HTN, hypothyroidism, HLD, anxiety, delusional parasitosis, mild cognitive impairment who is admitted with paroxysmal atrial fibrillation with RVR.  Seen by cardiology and started on IV amiodarone drip.    Assessment & Plan:   Principal Problem:   Paroxysmal atrial fibrillation with rapid ventricular response (HCC) Active Problems:   Acute renal failure superimposed on stage 3b chronic kidney disease (HCC)   Hypotension   Chronic HFrEF (heart failure with reduced ejection fraction) (HCC)   Type 2 diabetes mellitus with diabetic neuropathy, with long-term current use of insulin (HCC)   Hypothyroidism, acquired   PAF (paroxysmal atrial fibrillation) (HCC)   Acute on chronic combined systolic and diastolic CHF (congestive heart failure) (HCC)   Assessment and Plan: Paroxysmal atrial fibrillation with rapid ventricular response ([name]) Presenting with Melissa [name]-fib with RVR, rate 140s.  Blood pressure soft.  Cardiology consulted and started IV amiodarone drip.  Tachycardia, tachypnea, hypotension all likely driven by Melissa [name]-fib with RVR.  No sign of active infection.  Sepsis not ruled in. -Cardiology following -Continue IV amiodarone per cardiology -> transitioning to PO today -Update echocardiogram -Keep on telemetry -Hold Toprol-XL with soft blood pressure -Continue Eliquis 2.5 mg twice daily -Check magnesium and TSH   Hypotension Suspect due to Melissa [name]-fib with RVR, hypovolemia?  Given 1 L NS in the ED.  SBP improved at this time.  Possible contribution from infection with pneumonia.   -abx with unasyn as below -Follow blood cultures -Hold  antihypertensives - BP on soft side last night again  Acute renal failure superimposed on stage 3b chronic kidney disease (HCC) Creatinine 2.42 on admit compared to baseline around 1.3.  Suspected prerenal from hypoperfusion in setting of Melissa [name]-fib with RVR, hypovolemia, hypotension? -creatinine worsening today, UOP appears poor, unclear how accurate.  -S/p 1 L NS in the ED -BP is still intermittently soft, not overtly overloaded, will follow with gentle IVF in setting of HFrEF -UA with negative protein, 0-5 RBC's -> fena suggestive of intrinsic dz (urine urea nitrogen for FeUrea was canceled)  -Renal ultrasound with chronic medical renal disease -Monitor UOP, follow bladder scan   Right Sided Pain (shoulder, arm, ear, neck) - unclear cause, will get troponin though would be unusual presentation, lactic  - LFT's unremarkable, has had prior cholecystectomy - low dose morphine, consider additional w/u if doesn't improve with conservative measures  Concern for Aspiration  Abnormal CXR  Wheezing - repeat CXR basilar predominant opacities, unchanged small scattered bilateral nodular opacities - SLP eval -> mild aspiration risk, dysphagia 3/thin liquid diet recommended - albuterol ordered for wheezing  Chronic HFrEF (heart failure with reduced ejection fraction) ([place]) EF 20-25% by last TTE 04/13/2021.  Appears euvolemic but may have some mild pulm edema on CXR.  Received 1 L NS in the ED for hypotension.  Has AKI on CKD stage IIIb. -Obtain echocardiogram -> EF 25%, global hypokinesis, moderately reduced RVSF (see report) -Hold Lasix for now, monitor strict I/O's and daily weights -cautious fluids with AKI given above -Hold Toprol-XL given hypotension -Not currently on ACE/ARB/Spiro; would not add given AKI -Cardiology following   Type 2 diabetes mellitus with diabetic neuropathy, with long-term  current use of insulin ([name]) Placed on SSI.  Reduced dose basal ordered. A1c 8/5 (05/2022) BG's  appropriate   Hypothyroidism, acquired Continue home dose Synthroid 50 mcg daily.  Check TSH (3.5 wnl).   Hypomagnesemia Replace and follow     DVT prophylaxis: eliquis Code Status: full Family Communication: none at bedside - called daughter, 11/3 Disposition:   Status is: Inpatient Remains inpatient appropriate because: continued need for inpatient management   Consultants:  cardiology  Procedures:  Echo IMPRESSIONS     1. Left ventricular ejection fraction, by estimation, is 25%. The left  ventricle has severely decreased function. The left ventricle demonstrates  global hypokinesis. Left ventricular diastolic parameters are  indeterminate.   2. Right ventricular systolic function is moderately reduced. The right  ventricular size is normal. There is normal pulmonary artery systolic  pressure. The estimated right ventricular systolic pressure is 66.4 mmHg.   3. Left atrial size was moderately dilated.   4. The mitral valve is abnormal with restricted posterior leaflet.  Moderate mitral valve regurgitation. No evidence of mitral stenosis.   5. Tricuspid valve regurgitation is mild to moderate.   6. The aortic valve is tricuspid. Aortic valve regurgitation is not  visualized. No aortic stenosis is present.   7. The inferior vena cava is normal in size with <50% respiratory  variability, suggesting right atrial pressure of 8 mmHg.   8. The patient was in atrial fibrillation.    Antimicrobials:  Anti-infectives (From admission, onward)    Start     Dose/Rate Route Frequency Ordered Stop   08/14/22 1915  ampicillin-sulbactam (UNASYN) 1.5 g in sodium chloride 0.9 % 100 mL IVPB        1.5 g 200 mL/hr over 30 Minutes Intravenous Every 12 hours 08/14/22 1817 08/19/22 2159   08/13/22 1923  vancomycin variable dose per unstable renal function (pharmacist dosing)  Status:  Discontinued         Does not apply See admin instructions 08/13/22 1924 08/13/22 2105   08/13/22  1830  vancomycin (VANCOREADY) IVPB 1250 mg/250 mL        1,250 mg 166.7 mL/hr over 90 Minutes Intravenous  Once 08/13/22 1828 08/13/22 2040   08/13/22 1815  ceFEPIme (MAXIPIME) 2 g in sodium chloride 0.9 % 100 mL IVPB        2 g 200 mL/hr over 30 Minutes Intravenous  Once 08/13/22 1804 08/13/22 1844       Subjective: SOB this morning, chest pain was related to cough  Objective: Vitals:   08/16/22 0647 08/16/22 0746 08/16/22 0914 08/16/22 1303  BP: (!) 101/53 (!) 101/58  (!) 89/69  Pulse: 80 80    Resp: '17 19  19  '$ Temp:  97.6 F (36.4 C)    TempSrc:  Oral    SpO2: 97% 93% 92%   Weight:      Height:        Intake/Output Summary (Last 24 hours) at 08/16/2022 1412 Last data filed at 08/16/2022 1302 Gross per 24 hour  Intake 826.47 ml  Output 300 ml  Net 526.47 ml   Filed Weights   08/13/22 1737 08/15/22 0600 08/16/22 0440  Weight: 63.5 kg 68.7 kg 69 kg    Examination:  General: No acute distress. Cardiovascular: RRR Lungs: audible wheezing at bedside, mildy tachypneic Abdomen: Soft, nontender, nondistended Neurological: Alert and oriented 3. Moves all extremities 4. Cranial nerves II through XII grossly intact. Extremities: No clubbing or cyanosis. No edema.   Data Reviewed:  I have personally reviewed following labs and imaging studies  CBC: Recent Labs  Lab 08/13/22 1740 08/14/22 0518 08/15/22 0024 08/16/22 0023  WBC 7.0 7.1 11.8* 8.6  NEUTROABS 4.6  --  9.5* 6.2  HGB 10.1* 9.5* 9.8* 9.3*  HCT 31.9* 31.0* 32.3* 30.6*  MCV 88.1 89.1 89.0 90.0  PLT 182 178 193 782    Basic Metabolic Panel: Recent Labs  Lab 08/13/22 1740 08/13/22 1957 08/14/22 0518 08/15/22 0024 08/16/22 0023  NA 140  --  139 139 138  K 3.9  --  4.5 4.2 4.7  CL 105  --  105 104 104  CO2 22  --  21* 23 16*  GLUCOSE 175*  --  226* 108* 112*  BUN 37*  --  36* 38* 49*  CREATININE 2.42*  --  2.53* 2.55* 3.04*  CALCIUM 8.5*  --  7.9* 8.4* 8.4*  MG  --  1.2* 1.1* 1.3* 2.9*  PHOS   --   --   --  4.7* 4.9*    GFR: Estimated Creatinine Clearance: 11.3 mL/min ([facility]oblock) (by C-G formula based on SCr of 3.04 mg/dL (H)).  Liver Function Tests: Recent Labs  Lab 08/13/22 1740 08/14/22 0518 08/15/22 0024 08/16/22 0023  AST 12* 13* 12* 15  ALT '8 9 11 10  '$ ALKPHOS 68 62 61 55  BILITOT 0.4 0.4 0.5 0.4  PROT 6.7 6.2* 6.5 6.1*  ALBUMIN 3.2* 2.9* 3.1* 2.9*    CBG: Recent Labs  Lab 08/15/22 1120 08/15/22 1646 08/15/22 2144 08/16/22 0624 08/16/22 1122  GLUCAP 221* 163* 104* 112* 147*     Recent Results (from the past 240 hour(s))  Culture, blood (Routine x 2)     Status: None (Preliminary result)   Collection Time: 08/13/22  5:40 PM   Specimen: BLOOD  Result Value Ref Range Status   Specimen Description BLOOD RIGHT ANTECUBITAL  Final   Special Requests   Final    BOTTLES DRAWN AEROBIC AND ANAEROBIC Blood Culture adequate volume   Culture   Final    NO GROWTH 3 DAYS Performed at [facility], [place] [address] [place], [place] [address]    Report Status PENDING  Incomplete  Culture, blood (Routine x 2)     Status: None (Preliminary result)   Collection Time: 08/13/22  5:50 PM   Specimen: BLOOD RIGHT HAND  Result Value Ref Range Status   Specimen Description BLOOD RIGHT HAND  Final   Special Requests   Final    BOTTLES DRAWN AEROBIC AND ANAEROBIC Blood Culture results may not be optimal due to an inadequate volume of blood received in culture bottles   Culture   Final    NO GROWTH 3 DAYS Performed at [facility], [place] [address], [place], [place] [address]    Report Status PENDING  Incomplete  MRSA Next Gen by PCR, Nasal     Status: None   Collection Time: 08/13/22  6:29 PM   Specimen: Nasal Mucosa; Nasal Swab  Result Value Ref Range Status   MRSA by PCR Next Gen NOT DETECTED NOT DETECTED Final    Comment: (NOTE) The GeneXpert MRSA Assay (FDA approved for NASAL specimens only), is one component of Joshalyn Ancheta comprehensive MRSA colonization  surveillance program. It is not intended to diagnose MRSA infection nor to guide or monitor treatment for MRSA infections. Test performance is not FDA approved in patients less than 80 years old. Performed at [facility], [place] [address],  [place], [place] [address]          Radiology Studies: DG CHEST PORT 1 VIEW  Result Date: 08/16/2022 CLINICAL DATA:  Chest pain EXAM: PORTABLE CHEST 1 VIEW COMPARISON:  Chest x-ray dated August 14, 2022; chest CT dated December 18, 2021 FINDINGS: Cardiac and mediastinal contours are unchanged. Basilar predominant airspace opacities, similar to prior exam. Unchanged small scattered bilateral nodular opacities. No large pleural effusion or pneumothorax. IMPRESSION: 1. Basilar predominant opacities, similar to prior exam likely due to atelectasis. 2. Unchanged small scattered bilateral nodular opacities, likely due to chronic atypical infection. Electronically Signed   By: Yetta Glassman M.D.   On: 08/16/2022 08:50        Scheduled Meds:  amiodarone  200 mg Oral BID   [START ON 08/22/2022] amiodarone  200 mg Oral Daily   apixaban  2.5 mg Oral BID   FLUoxetine  20 mg Oral Q breakfast   insulin aspart  0-9 Units Subcutaneous TID WC   insulin detemir  12 Units Subcutaneous Daily   levothyroxine  50 mcg Oral QAC breakfast   pramipexole  0.5 mg Oral QHS   QUEtiapine  100 mg Oral QHS   sodium chloride flush  3 mL Intravenous Q12H   Continuous Infusions:  ampicillin-sulbactam (UNASYN) IV 1.5 g (08/16/22 1120)   lactated ringers       LOS: 3 days    Time spent: over 30 min    Fayrene Helper, MD [facility]   To contact the attending provider between 7A-7P or the covering provider during after hours 7P-7A, please log into the web site [email or web address] and access using universal [place] password for that web site. If you do not have the password, please call the hospital operator.  08/16/2022, 2:12 PM

## 2022-08-16 NOTE — Progress Notes (Signed)
Mobility Specialist Progress Note    08/16/22 1042  Mobility  Activity Ambulated with assistance in hallway  Level of Assistance Contact guard assist, steadying assist  Assistive Device Four wheel walker  Distance Ambulated (ft) 120 ft  Activity Response Tolerated well  Mobility Referral Yes  $Mobility charge 1 Mobility   Pre-Mobility: 104 HR, 97/66 (77) BP During Mobility: 133 HR Post-Mobility: 113 HR, 107/95 (102) BP  Pt received in chair and agreeable. No complaints on walk. Had void on BSC. Returned to chair with call bell in reach. Pt stated the walk wore her out.   Hildred Alamin Mobility Specialist  Secure Chat Only

## 2022-08-16 NOTE — Progress Notes (Signed)
Patient c/o 10 out of 10 chest pressure that she describes as a "choking kind of pain".  BP 101/53. 12 Lead EKG complete. Dr. Nevada Crane notified via text page. Awaiting response.

## 2022-08-16 NOTE — Progress Notes (Addendum)
Rounding Note    Patient Name: Melissa [name] Date of Encounter: 08/16/2022  [place] Cardiologist: [name], MD   Subjective   This AM, patient complained of chest pain. Pain is located in the middle of her chest, feels like "tightness" or like she is "choking" when she coughs. Chest pain is not present when she is not coughing.   Patient has audible wheezing.   Inpatient Medications    Scheduled Meds:  amiodarone  200 mg Oral BID   apixaban  2.5 mg Oral BID   FLUoxetine  20 mg Oral Q breakfast   insulin aspart  0-9 Units Subcutaneous TID WC   insulin detemir  12 Units Subcutaneous Daily   levothyroxine  50 mcg Oral QAC breakfast   pramipexole  0.5 mg Oral QHS   QUEtiapine  100 mg Oral QHS   sodium chloride flush  3 mL Intravenous Q12H   Continuous Infusions:  ampicillin-sulbactam (UNASYN) IV Stopped (08/15/22 2219)   PRN Meds: acetaminophen **OR** acetaminophen, guaiFENesin-dextromethorphan, ondansetron **OR** ondansetron (ZOFRAN) IV, senna-docusate   Vital Signs    Vitals:   08/16/22 0315 08/16/22 0440 08/16/22 0647 08/16/22 0746  BP: (!) 94/58  (!) 101/53 (!) 101/58  Pulse: 80  80 80  Resp: '20  17 19  '$ Temp: 97.9 F (36.6 C)   97.6 F (36.4 C)  TempSrc: Axillary   Oral  SpO2: 100%  97% 93%  Weight:  69 kg    Height:        Intake/Output Summary (Last 24 hours) at 08/16/2022 0751 Last data filed at 08/16/2022 6948 Gross per 24 hour  Intake 826.47 ml  Output 200 ml  Net 626.47 ml      08/16/2022    4:40 AM 08/15/2022    6:00 AM 08/13/2022    5:37 PM  Last 3 Weights  Weight (lbs) 152 lb 1.9 oz 151 lb 7.3 oz 140 lb  Weight (kg) 69 kg 68.7 kg 63.504 kg      Telemetry    NSR  - Personally Reviewed  ECG    Normal Sinus Rhythm with PACs, PVCs - Personally Reviewed  Physical Exam   GEN: No acute distress but appears uncomfortable, anxious. Sitting upright in the bed   Neck: No JVD Cardiac: RRR, no murmurs, rubs, or gallops. Radial  pulses 2+ bilaterally  Respiratory: Inspiratory and expiratory wheezing heard throughout. Crackles in lung bases  GI: Soft, nontender, non-distended  MS: No edema; No deformity. Neuro:  Nonfocal  Psych: Normal affect   Labs    High Sensitivity Troponin:   Recent Labs  Lab 08/13/22 1957  TROPONINIHS 12     Chemistry Recent Labs  Lab 08/14/22 0518 08/15/22 0024 08/16/22 0023  NA 139 139 138  K 4.5 4.2 4.7  CL 105 104 104  CO2 21* 23 16*  GLUCOSE 226* 108* 112*  BUN 36* 38* 49*  CREATININE 2.53* 2.55* 3.04*  CALCIUM 7.9* 8.4* 8.4*  MG 1.1* 1.3* 2.9*  PROT 6.2* 6.5 6.1*  ALBUMIN 2.9* 3.1* 2.9*  AST 13* 12* 15  ALT '9 11 10  '$ ALKPHOS 62 61 55  BILITOT 0.4 0.5 0.4  GFRNONAA 17* 17* 14*  ANIONGAP 13 12 18*    Lipids No results for input(s): "CHOL", "TRIG", "HDL", "LABVLDL", "LDLCALC", "CHOLHDL" in the last 168 hours.  Hematology Recent Labs  Lab 08/14/22 0518 08/15/22 0024 08/16/22 0023  WBC 7.1 11.8* 8.6  RBC 3.48* 3.63* 3.40*  HGB 9.5* 9.8* 9.3*  HCT 31.0* 32.3* 30.6*  MCV 89.1 89.0 90.0  MCH 27.3 27.0 27.4  MCHC 30.6 30.3 30.4  RDW 17.4* 17.5* 17.7*  PLT 178 193 165   Thyroid  Recent Labs  Lab 08/16/22 0023  TSH 3.352    BNPNo results for input(s): "BNP", "PROBNP" in the last 168 hours.  DDimer No results for input(s): "DDIMER" in the last 168 hours.   Radiology    ECHOCARDIOGRAM COMPLETE  Result Date: 08/14/2022    ECHOCARDIOGRAM REPORT   Patient Name:   Melissa [name] Date of Exam: 08/14/2022 Medical Rec #:  [id number]     Height:       64.0 in Accession #:    [id number]    Weight:       140.0 lb Date of Birth:  1928/08/24     BSA:          1.681 m Patient Age:    88 years      BP:           81/71 mmHg Patient Gender: F             HR:           121 bpm. Exam Location:  Inpatient Procedure: 2D Echo, Color Doppler and Cardiac Doppler Indications:    Atrial fibrillation  History:        Patient has prior history of Echocardiogram examinations, most                  recent 04/13/2021. HFrEF and Cardiomyopathy, Arrythmias:Atrial                 Fibrillation, Signs/Symptoms:Dyspnea and Fatigue; Risk                 Factors:Diabetes, Hypertension and Dyslipidemia. CKD.  Sonographer:    Eartha Inch Referring Phys: [id number] VISHAL R PATEL  Sonographer Comments: Technically difficult study due to poor echo windows. Image acquisition challenging due to patient body habitus and Image acquisition challenging due to respiratory motion. IMPRESSIONS  1. Left ventricular ejection fraction, by estimation, is 25%. The left ventricle has severely decreased function. The left ventricle demonstrates global hypokinesis. Left ventricular diastolic parameters are indeterminate.  2. Right ventricular systolic function is moderately reduced. The right ventricular size is normal. There is normal pulmonary artery systolic pressure. The estimated right ventricular systolic pressure is 50.0 mmHg.  3. Left atrial size was moderately dilated.  4. The mitral valve is abnormal with restricted posterior leaflet. Moderate mitral valve regurgitation. No evidence of mitral stenosis.  5. Tricuspid valve regurgitation is mild to moderate.  6. The aortic valve is tricuspid. Aortic valve regurgitation is not visualized. No aortic stenosis is present.  7. The inferior vena cava is normal in size with <50% respiratory variability, suggesting right atrial pressure of 8 mmHg.  8. The patient was in atrial fibrillation. FINDINGS  Left Ventricle: Left ventricular ejection fraction, by estimation, is 25%. The left ventricle has severely decreased function. The left ventricle demonstrates global hypokinesis. The left ventricular internal cavity size was normal in size. There is no left ventricular hypertrophy. Left ventricular diastolic parameters are indeterminate. Right Ventricle: The right ventricular size is normal. No increase in right ventricular wall thickness. Right ventricular systolic function is  moderately reduced. There is normal pulmonary artery systolic pressure. The tricuspid regurgitant velocity is 2.24 m/s, and with an assumed right atrial pressure of 8 mmHg, the estimated right ventricular systolic pressure is 37.0 mmHg. Left Atrium: Left atrial  size was moderately dilated. Right Atrium: Right atrial size was normal in size. Pericardium: Trivial pericardial effusion is present. Mitral Valve: The mitral valve is abnormal. Moderate mitral valve regurgitation. No evidence of mitral valve stenosis. MV peak gradient, 10.4 mmHg. The mean mitral valve gradient is 6.0 mmHg. Tricuspid Valve: The tricuspid valve is normal in structure. Tricuspid valve regurgitation is mild to moderate. Aortic Valve: The aortic valve is tricuspid. Aortic valve regurgitation is not visualized. No aortic stenosis is present. Pulmonic Valve: The pulmonic valve was normal in structure. Pulmonic valve regurgitation is trivial. Aorta: The aortic root is normal in size and structure. Venous: The inferior vena cava is normal in size with less than 50% respiratory variability, suggesting right atrial pressure of 8 mmHg. IAS/Shunts: No atrial level shunt detected by color flow Doppler.  LEFT VENTRICLE PLAX 2D LVIDd:         5.30 cm      Diastology LVIDs:         5.10 cm      LV e' medial:   7.38 cm/s LV PW:         1.00 cm      LV E/e' medial: 18.0 LV IVS:        0.70 cm LVOT diam:     1.80 cm LVOT Area:     2.54 cm  LV Volumes (MOD) LV vol d, MOD A2C: 114.0 ml LV vol d, MOD A4C: 97.4 ml LV vol s, MOD A2C: 72.3 ml LV vol s, MOD A4C: 74.7 ml LV SV MOD A2C:     41.7 ml LV SV MOD A4C:     97.4 ml LV SV MOD BP:      31.8 ml RIGHT VENTRICLE         IVC TAPSE (M-mode): 1.1 cm  IVC diam: 2.00 cm LEFT ATRIUM             Index        RIGHT ATRIUM           Index LA diam:        3.90 cm 2.32 cm/m   RA Area:     12.70 cm LA Vol (A2C):   80.5 ml 47.88 ml/m  RA Volume:   27.10 ml  16.12 ml/m LA Vol (A4C):   73.0 ml 43.42 ml/m LA Biplane Vol:  80.7 ml 48.00 ml/m   AORTA Ao Root diam: 3.10 cm Ao Asc diam:  3.00 cm MITRAL VALVE                TRICUSPID VALVE MV Area (PHT): 3.61 cm     TR Peak grad:   20.1 mmHg MV Peak grad:  10.4 mmHg    TR Vmax:        224.00 cm/s MV Mean grad:  6.0 mmHg MV Vmax:       1.61 m/s     SHUNTS MV Vmean:      119.0 cm/s   Systemic Diam: 1.80 cm MV Decel Time: 210 msec MR Peak grad: 54.2 mmHg MR Mean grad: 40.0 mmHg MR Vmax:      368.00 cm/s MR Vmean:     307.0 cm/s MV E velocity: 133.00 cm/s Dalton McleanMD Electronically signed by Franki Monte Signature Date/Time: 08/14/2022/2:42:15 PM    Final    DG CHEST PORT 1 VIEW  Result Date: 08/14/2022 CLINICAL DATA:  Shortness of breath. EXAM: PORTABLE CHEST 1 VIEW COMPARISON:  Chest x-ray from yesterday. FINDINGS: The heart size and  mediastinal contours are within normal limits. Normal pulmonary vascularity. Increasing interstitial markings in the left mid lung and right lung base. No focal consolidation, pleural effusion, or pneumothorax. No acute osseous abnormality. Prior right proximal humerus ORIF. IMPRESSION: 1. Increasing interstitial markings in the left mid lung and right lung base, concerning for atypical infection. Electronically Signed   By: Titus Dubin M.D.   On: 08/14/2022 10:37    Cardiac Studies   Echocardiogram 08/14/22 1. Left ventricular ejection fraction, by estimation, is 25%. The left  ventricle has severely decreased function. The left ventricle demonstrates  global hypokinesis. Left ventricular diastolic parameters are  indeterminate.   2. Right ventricular systolic function is moderately reduced. The right  ventricular size is normal. There is normal pulmonary artery systolic  pressure. The estimated right ventricular systolic pressure is 51.7 mmHg.   3. Left atrial size was moderately dilated.   4. The mitral valve is abnormal with restricted posterior leaflet.  Moderate mitral valve regurgitation. No evidence of mitral stenosis.   5.  Tricuspid valve regurgitation is mild to moderate.   6. The aortic valve is tricuspid. Aortic valve regurgitation is not  visualized. No aortic stenosis is present.   7. The inferior vena cava is normal in size with <50% respiratory  variability, suggesting right atrial pressure of 8 mmHg.   8. The patient was in atrial fibrillation.   Patient Profile     88 y.o. female with a hx of atrial fib followed by [name], last seen 08/2021 also EF 20-25% NICM, delusional disorder, DM-2, GERD now admitted with sepsis who is being seen for the evaluation of atrial fib at the request of Dr. Armandina Gemma.    Assessment & Plan    Paroxysmal Atrial Fibrillation  - Note patient was admitted in 05/2021 with pneumonia, found to have A-fib with RVR.  She was started on Eliquis 2.5 twice daily, underwent cardioversion in 05/2021.  More recently admitted in 06/2022 with pneumonia, maintained normal sinus rhythm that admission - Now presented with shortness of breath. Found to be in afib with RVR - Treated with IV amiodarone and converted to NSR yesterday. Now on PO amiodarone 200 mg BID for 7 days. After 7 days, reduce dose to 200 mg daily.  - Per telemetry, patient maintaining NSR  - Home metoprolol held due to soft BP while in Afib. Since patient converted to NSR, BP has improved but is still low end of normal. Continue to hold BB for now - Continue Eliquis 2.5 mg twice daily (dose reduced due to age, renal function) - Echo this admission with EF 25%, moderately reduced RV function, moderately dilated LA, moderate mitral valve regurgitation  - Maintain K>4, mag>2    Chronic systolic heart failure - Most recent echocardiogram completed 04/2021 showed EF 20-25%, global hypokinesis, normal RV systolic function - Echo this admission with EF 25%, moderately reduced RV systolic function, moderate mitral valve regurgitation (restricted posterior leaflet)  -Home metoprolol held due to soft BP.  Not on ACE/ARB/ARNI or  Arlyce Harman given CKD - Home Lasix held given AKI on CKD stage IIIb - Use caution when giving IV fluids given reduced EF    Hypotension - BP soft this admission  - Holding home BP medications  - Patient has received IV fluids - Suspect BP will improve with HR control    AKI on CKD stage IIIb -Creatinine elevated to 2.42 on admission, baseline approximately 1.3 -Creatinine further increased to 3.04 this a.m. Avoid nephrotoxic medications  -  Workup per primary  - Holding home BP medications to ensure adequate renal perfusion - RN reports that patient has not been eating, possible poor PO intake is contributing. Patient dry on exam    Otherwise Per Primary  - Concern for Aspiration-- SLP eval with mild aspiration risk. CXR pending this AM  - Type 2 DM  - Hypothyroidism   For questions or updates, please contact [facility] Please consult [email or web address] for contact info under        Signed, Margie Billet, PA-C  08/16/2022, 7:51 AM    Patient examined chart reviewed Elderly female no distress SSCP with cough audible wheezing CXR ? Atypical infection vs CHF. Has converted to NSR ECG this am NSR no acute ST changes Beta blocker held. Not a candidate for inotropes given age F/U CXR may need nephrology involvement with  cardiorenal issues No edema but EF 25% with moderate MR likely low output   Jenkins Rouge MD [facility]

## 2022-08-16 NOTE — Plan of Care (Signed)
  Problem: Activity: Goal: Ability to tolerate increased activity will improve Outcome: Progressing   Problem: Health Behavior/Discharge Planning: Goal: Ability to safely manage health-related needs after discharge will improve Outcome: Progressing   Problem: Coping: Goal: Ability to adjust to condition or change in health will improve Outcome: Progressing   Problem: Clinical Measurements: Goal: Diagnostic test results will improve Outcome: Progressing Goal: Respiratory complications will improve Outcome: Progressing Goal: Cardiovascular complication will be avoided Outcome: Progressing   Problem: Coping: Goal: Level of anxiety will decrease Outcome: Progressing   Problem: Pain Managment: Goal: General experience of comfort will improve Outcome: Progressing   Problem: Safety: Goal: Ability to remain free from injury will improve Outcome: Progressing

## 2022-08-16 NOTE — Evaluation (Signed)
Occupational Therapy Evaluation Patient Details Name: Melissa [name] MRN: [id number] DOB: [birth year] Today's Date: 08/16/2022   History of Present Illness Melissa [name] is a 88 y.o. female who was admitted with paroxysmal atrial fibrillation with RVR. PMH: HFrEF (EF 20-25% per TTE 04/13/2021), NICM, PAF on Eliquis, CKD stage IIIb, insulin-dependent T2DM, HTN, hypothyroidism, HLD, anxiety, delusional parasitosis, mild cognitive impairment   Clinical Impression   Pt lives with her daughter and is assisted for bathing and all IADLs. She can typically feed, dress and take herself to the bathroom with her RW. Pt presents with impaired cognition, likely baseline, generalized weakness and impaired standing balance. She needed min guard assist for OOB with RW and min to mod assist for ADLs. Will follow acutely. Do not anticipate pt will need follow up OT.      Recommendations for follow up therapy are one component of a multi-disciplinary discharge planning process, led by the attending physician.  Recommendations may be updated based on patient status, additional functional criteria and insurance authorization.   Follow Up Recommendations  No OT follow up    Assistance Recommended at Discharge Frequent or constant Supervision/Assistance  Patient can return home with the following A little help with walking and/or transfers;A lot of help with bathing/dressing/bathroom;Assistance with cooking/housework;Assistance with feeding;Direct supervision/assist for medications management;Direct supervision/assist for financial management;Assist for transportation;Help with stairs or ramp for entrance    Functional Status Assessment  Patient has had a recent decline in their functional status and demonstrates the ability to make significant improvements in function in a reasonable and predictable amount of time.  Equipment Recommendations  None recommended by OT    Recommendations for Other Services        Precautions / Restrictions Precautions Precautions: Fall Restrictions Weight Bearing Restrictions: No      Mobility Bed Mobility Overal bed mobility: Needs Assistance Bed Mobility: Supine to Sit     Supine to sit: Supervision, HOB elevated     General bed mobility comments: line management only    Transfers Overall transfer level: Needs assistance Equipment used: Rolling walker (2 wheels) Transfers: Sit to/from Stand, Bed to chair/wheelchair/BSC Sit to Stand: Min guard     Step pivot transfers: Min guard            Balance Overall balance assessment: Needs assistance   Sitting balance-Leahy Scale: Fair       Standing balance-Leahy Scale: Poor                             ADL either performed or assessed with clinical judgement   ADL Overall ADL's : Needs assistance/impaired Eating/Feeding: Set up;Sitting Eating/Feeding Details (indicate cue type and reason): assist to open containers Grooming: Wash/dry hands;Wash/dry face;Sitting;Set up;Oral care;Maximal assistance Grooming Details (indicate cue type and reason): assist to clean dentures Upper Body Bathing: Minimal assistance;Sitting   Lower Body Bathing: Moderate assistance;Sit to/from stand   Upper Body Dressing : Minimal assistance;Sitting   Lower Body Dressing: Sit to/from stand;Minimal assistance   Toilet Transfer: Min guard;Stand-pivot;BSC/3in1;Rolling walker (2 wheels)   Toileting- Clothing Manipulation and Hygiene: Sit to/from stand;Minimal assistance               Vision Baseline Vision/History: 6 Macular Degeneration Ability to See in Adequate Light: 2 Moderately impaired Patient Visual Report: No change from baseline       Perception     Praxis      Pertinent Vitals/Pain Pain Assessment Pain Assessment:  Faces Faces Pain Scale: No hurt     Hand Dominance Right   Extremity/Trunk Assessment Upper Extremity Assessment Upper Extremity Assessment: Generalized  weakness   Lower Extremity Assessment Lower Extremity Assessment: Defer to PT evaluation   Cervical / Trunk Assessment Cervical / Trunk Assessment: Kyphotic   Communication Communication Communication: No difficulties   Cognition Arousal/Alertness: Awake/alert Behavior During Therapy: Flat affect Overall Cognitive Status: History of cognitive impairments - at baseline                                 General Comments: pt with hx of dementia, did not recall she had eaten breakfast, poor historian     General Comments       Exercises     Shoulder Instructions      Home Living Family/patient expects to be discharged to:: Private residence Living Arrangements: Children Available Help at Discharge: Family;Available 24 hours/day Type of Home: Apartment Home Access: Level entry     Home Layout: One level     Bathroom Shower/Tub: Teacher, early years/pre: Standard     Home Equipment: Rollator (4 wheels);Cane - single point;Tub bench;Grab bars - tub/shower          Prior Functioning/Environment Prior Level of Function : Needs assist             Mobility Comments: pt ambulates in home with rollator ADLs Comments: palliative care comes in 3x/wk to bath patient, dtr cooks but pt feeds herself majority of time, pt dresses her self majority of time, dtr reports pt has tremors sometimes and then she will need to help the pt with ADLs, pt's granddaughter does her medications        OT Problem List: Decreased strength;Impaired balance (sitting and/or standing);Decreased activity tolerance;Decreased cognition;Decreased knowledge of use of DME or AE      OT Treatment/Interventions: Self-care/ADL training;DME and/or AE instruction;Therapeutic activities;Patient/family education;Balance training    OT Goals(Current goals can be found in the care plan section) Acute Rehab OT Goals OT Goal Formulation: With patient Time For Goal Achievement:  09-11-22 Potential to Achieve Goals: Good ADL Goals Pt Will Perform Grooming: with supervision;standing Pt Will Perform Upper Body Dressing: with set-up;sitting Pt Will Perform Lower Body Dressing: with supervision;sit to/from stand Pt Will Transfer to Toilet: with supervision;ambulating;bedside commode Pt Will Perform Toileting - Clothing Manipulation and hygiene: with supervision;sit to/from stand  OT Frequency: Min 2X/week    Co-evaluation              AM-PAC OT "6 Clicks" Daily Activity     Outcome Measure Help from another person eating meals?: A Little Help from another person taking care of personal grooming?: A Lot Help from another person toileting, which includes using toliet, bedpan, or urinal?: A Little Help from another person bathing (including washing, rinsing, drying)?: A Lot Help from another person to put on and taking off regular upper body clothing?: A Little Help from another person to put on and taking off regular lower body clothing?: A Little 6 Click Score: 16   End of Session Equipment Utilized During Treatment: Rolling walker (2 wheels);Gait belt Nurse Communication: Mobility status  Activity Tolerance: Patient tolerated treatment well Patient left: in chair;with call bell/phone within reach;with chair alarm set  OT Visit Diagnosis: Unsteadiness on feet (R26.81);Other abnormalities of gait and mobility (R26.89);Other symptoms and signs involving cognitive function;Muscle weakness (generalized) (M62.81)  Time: 5248-1859 OT Time Calculation (min): 18 min Charges:  OT General Charges $OT Visit: 1 Visit OT Evaluation $OT Eval Moderate Complexity: [place], OTR/L Acute Rehabilitation Services Office: [phone]  Malka So 08/16/2022, 12:34 PM

## 2022-08-16 NOTE — TOC Initial Note (Signed)
Transition of Care [facility]) - Initial/Assessment Note    Patient Details  Name: Melissa [name] MRN: [id number] Date of Birth: 02/28/1928  Transition of Care [facility]) CM/SW Contact:    Marilu Favre, RN Phone Number: 08/16/2022, 1:03 PM  Clinical Narrative:                    Spoke to patient and daughter at bedside and dicussed PT recommendations for HHPT. Patient has an aide coming three times a week to bath patient at home currently. Daughter believes aide is through [facility] .   NCM spoke to [place] with [facility] and Hospice.   Prior to admission patient was active with them for hospice services. Aide was coming through [facility] and [facility] under hospice.   Patient cannot have HHPT and hospice at same time.   Patient can discharge with home health services and palliative, however, she will not have an aide.   However, [facility] and hospice can continue hospice services ( patient can keep aide) and Hoyle Sauer will ask for approval for 2 PT visits.   Explained above to [place] at bedside. [name] would like to have hospice services and have Hoyle Sauer with [facility] and hospice ask for approval for 2 PT visits at discharge.   Hoyle Sauer with [facility] and Hospice aware and does not need any orders.  Expected Discharge Plan: Home w Hospice Care Barriers to Discharge: Continued Medical Work up   Patient Goals and CMS Choice Patient states their goals for this hospitalization and ongoing recovery are:: to return to home CMS Medicare.gov Compare Post Acute Care list provided to:: Patient Choice offered to / list presented to : Patient, Adult Children  Expected Discharge Plan and Services Expected Discharge Plan: Home w Hospice Care   Discharge Planning Services: CM Consult Post Acute Care Choice: Home Health, Hospice Living arrangements for the past 2 months: Single Family Home                 DME Arranged: N/A DME Agency: NA       HH Arranged: NA           Prior Living Arrangements/Services Living arrangements for the past 2 months: Single Family Home Lives with:: Adult Children Patient language and need for interpreter reviewed:: Yes Do you feel safe going back to the place where you live?: Yes      Need for Family Participation in Patient Care: Yes (Comment) Care giver support system in place?: Yes (comment) Current home services: DME Criminal Activity/Legal Involvement Pertinent to Current Situation/Hospitalization: No - Comment as needed  Activities of Daily Living Home Assistive Devices/Equipment: Oxygen ADL Screening (condition at time of admission) Patient's cognitive ability adequate to safely complete daily activities?: No Is the patient deaf or have difficulty hearing?: Yes Does the patient have difficulty seeing, even when wearing glasses/contacts?: No Does the patient have difficulty concentrating, remembering, or making decisions?: No Patient able to express need for assistance with ADLs?: Yes Does the patient have difficulty dressing or bathing?: No Independently performs ADLs?: No Communication: Independent Dressing (OT): Needs assistance Grooming: Needs assistance Feeding: Needs assistance Bathing: Needs assistance Toileting: Needs assistance In/Out Bed: Needs assistance Walks in Home: Needs assistance Does the patient have difficulty walking or climbing stairs?: Yes Weakness of Legs: Both Weakness of Arms/Hands: Both  Permission Sought/Granted   Permission granted to share information with : Yes, Verbal Permission Granted  Share Information with NAME: daughter [name]  Permission granted to share info w AGENCY: [facility] and Hospice        Emotional Assessment Appearance:: Appears stated age     Orientation: : Oriented to Self Alcohol / Substance Use: Not Applicable Psych Involvement: No (comment)  Admission diagnosis:  AKI (acute kidney injury) ([place]) [N17.9] Atrial fibrillation with RVR  ([place]) [I48.91] Other fatigue [R53.83] Paroxysmal atrial fibrillation with rapid ventricular response ([place]) [I48.0] Acute cough [R05.1] Patient Active Problem List   Diagnosis Date Noted   Paroxysmal atrial fibrillation with rapid ventricular response ([place]) 08/13/2022   Chronic HFrEF (heart failure with reduced ejection fraction) ([place]) 08/13/2022   Uncontrolled type 2 diabetes mellitus with hyperglycemia, with long-term current use of insulin ([place]) 06/13/2022   Multifocal pneumonia 06/09/2022   CKD (chronic kidney disease) stage 4, GFR 15-29 ml/min (HCC) 06/09/2022   Allergic rhinitis 06/09/2022   Bilateral knee effusions 12/21/2021   Closed displaced fracture of proximal phalanx of lesser toe of left foot 12/21/2021   Closed nondisplaced fracture of proximal phalanx of lesser toe of right foot 12/21/2021   Visual hallucinations 10/17/2021   Tremor of both hands 10/17/2021   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) PMH PAF 04/06/2021   Secondary hypercoagulable state ([place]) 04/06/2021   Osteopenia 08/29/2020   Healthcare maintenance 07/06/2020   Hypokalemia 02/25/2020   Urgency of urination 02/25/2020   Chest pain 02/08/2020   Bronchiectasis without complication ([place]) 16/07/9603   Obstructive lung disease ([place]) 01/28/2020   Atrial fibrillation with RVR (HCC)    Acute respiratory failure with hypoxia ([place]) 07/11/2019   Pneumonia 07/11/2019   Abnormal chest CT 04/07/2018   RLS (restless legs syndrome) 08/05/2017   Closed fracture of right proximal humerus 06/05/2017   High cholesterol 06/05/2017   Hypertension 06/05/2017    NICM (nonischemic cardiomyopathy) ([place]) 06/05/2017   Acute anemia 06/05/2017   Dyspnea 54/06/8118   Chronic systolic heart failure ([place]) 10/19/2016   Symptomatic cholelithiasis 01/24/2016   History of CVA- old cerebellar infarcts noted on MRI 11/16/2015   Chest pain with moderate risk of acute coronary syndrome 11/14/2015   Left upper extremity numbness    Paresthesia 08/22/2015   Tremor, essential 08/22/2015   PAF (paroxysmal atrial fibrillation) ([place]) 03/06/2015   Chronic anticoagulation 03/06/2015   Hypotension 03/06/2015   Essential hypertension 04/19/2014   CKD stage IIIb ([place]) 04/19/2014   Hypothyroidism, acquired 06/15/2013   Normocytic anemia 06/10/2013   Acute renal failure superimposed on stage 3b chronic kidney disease ([place]) 09/26/2011   Type 2 diabetes mellitus with diabetic neuropathy, with long-term current use of insulin ([place]) 09/22/2011   HLD (hyperlipidemia) 09/22/2011   PCP:  Darreld Mclean, MD Pharmacy:   [facility], [place] [address] Phone: [phone] Fax: [phone]  [facility], [place] - [address] [place] [address] Phone: [phone] Fax: [phone]     Social Determinants of Health (SDOH) Interventions    Readmission Risk Interventions    08/16/2021   11:00 AM 04/16/2021    3:30 PM  Readmission Risk Prevention Plan  Transportation  Screening Complete Complete  PCP or Specialist Appt within 3-5 Days  Complete  HRI or Home Care Consult  Complete  Social Work Consult for [place] Planning/Counseling  Complete  Palliative Care Screening  Not Applicable  Medication Review Press photographer) Complete Complete  PCP or Specialist appointment within 3-5 days of discharge Complete   HRI or Home Care Consult Complete   SW Recovery Care/Counseling Consult Complete

## 2022-08-17 DIAGNOSIS — I480 Paroxysmal atrial fibrillation: Secondary | ICD-10-CM | POA: Diagnosis not present

## 2022-08-17 LAB — COMPREHENSIVE METABOLIC PANEL
ALT: 10 U/L (ref 0–44)
AST: 10 U/L — ABNORMAL LOW (ref 15–41)
Albumin: 2.6 g/dL — ABNORMAL LOW (ref 3.5–5.0)
Alkaline Phosphatase: 45 U/L (ref 38–126)
Anion gap: 11 (ref 5–15)
BUN: 50 mg/dL — ABNORMAL HIGH (ref 8–23)
CO2: 22 mmol/L (ref 22–32)
Calcium: 8.1 mg/dL — ABNORMAL LOW (ref 8.9–10.3)
Chloride: 102 mmol/L (ref 98–111)
Creatinine, Ser: 3.17 mg/dL — ABNORMAL HIGH (ref 0.44–1.00)
GFR, Estimated: 13 mL/min — ABNORMAL LOW (ref >60)
Glucose, Bld: 135 mg/dL — ABNORMAL HIGH (ref 70–99)
Potassium: 4.2 mmol/L (ref 3.5–5.1)
Sodium: 135 mmol/L (ref 135–145)
Total Bilirubin: 0.5 mg/dL (ref 0.3–1.2)
Total Protein: 5.4 g/dL — ABNORMAL LOW (ref 6.5–8.1)

## 2022-08-17 LAB — CBC WITH DIFFERENTIAL/PLATELET
Abs Immature Granulocytes: 0.04 10*3/uL (ref 0.00–0.07)
Basophils Absolute: 0 10*3/uL (ref 0.0–0.1)
Basophils Relative: 1 %
Eosinophils Absolute: 0.3 10*3/uL (ref 0.0–0.5)
Eosinophils Relative: 4 %
HCT: 27.5 % — ABNORMAL LOW (ref 36.0–46.0)
Hemoglobin: 8.6 g/dL — ABNORMAL LOW (ref 12.0–15.0)
Immature Granulocytes: 1 %
Lymphocytes Relative: 11 %
Lymphs Abs: 0.9 10*3/uL (ref 0.7–4.0)
MCH: 27.2 pg (ref 26.0–34.0)
MCHC: 31.3 g/dL (ref 30.0–36.0)
MCV: 87 fL (ref 80.0–100.0)
Monocytes Absolute: 1.2 10*3/uL — ABNORMAL HIGH (ref 0.1–1.0)
Monocytes Relative: 15 %
Neutro Abs: 5.6 10*3/uL (ref 1.7–7.7)
Neutrophils Relative %: 68 %
Platelets: 157 10*3/uL (ref 150–400)
RBC: 3.16 MIL/uL — ABNORMAL LOW (ref 3.87–5.11)
RDW: 17.6 % — ABNORMAL HIGH (ref 11.5–15.5)
WBC: 8 10*3/uL (ref 4.0–10.5)
nRBC: 0 % (ref 0.0–0.2)

## 2022-08-17 LAB — GLUCOSE, CAPILLARY
Glucose-Capillary: 133 mg/dL — ABNORMAL HIGH (ref 70–99)
Glucose-Capillary: 175 mg/dL — ABNORMAL HIGH (ref 70–99)
Glucose-Capillary: 71 mg/dL (ref 70–99)
Glucose-Capillary: 105 mg/dL — ABNORMAL HIGH (ref 70–99)

## 2022-08-17 LAB — PHOSPHORUS: Phosphorus: 4.8 mg/dL — ABNORMAL HIGH (ref 2.5–4.6)

## 2022-08-17 LAB — MAGNESIUM: Magnesium: 2.7 mg/dL — ABNORMAL HIGH (ref 1.7–2.4)

## 2022-08-17 MED ORDER — AMIODARONE HCL 200 MG PO TABS
400.0000 mg | ORAL_TABLET | Freq: Two times a day (BID) | ORAL | Status: DC
  Administered 2022-08-17 – 2022-08-21 (×9): 400 mg via ORAL
  Filled 2022-08-17 (×9): qty 2

## 2022-08-17 MED ORDER — MIDODRINE HCL 5 MG PO TABS
5.0000 mg | ORAL_TABLET | Freq: Three times a day (TID) | ORAL | Status: DC
  Administered 2022-08-17 – 2022-08-25 (×23): 5 mg via ORAL
  Filled 2022-08-17 (×23): qty 1
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
## 2022-08-17 NOTE — Progress Notes (Signed)
Rounding Note    Patient Name: Melissa [name] Date of Encounter: 08/17/2022  [place] Cardiologist: [name], MD   Subjective   No cp or palp but short of breath laying in bed. No other concerns.  Inpatient Medications    Scheduled Meds:  amiodarone  200 mg Oral BID   [START ON 08/22/2022] amiodarone  200 mg Oral Daily   apixaban  2.5 mg Oral BID   FLUoxetine  20 mg Oral Q breakfast   insulin aspart  0-9 Units Subcutaneous TID WC   insulin detemir  12 Units Subcutaneous Daily   levothyroxine  50 mcg Oral QAC breakfast   midodrine  5 mg Oral TID WC   pramipexole  0.5 mg Oral QHS   QUEtiapine  100 mg Oral QHS   sodium chloride flush  3 mL Intravenous Q12H   Continuous Infusions:  ampicillin-sulbactam (UNASYN) IV 1.5 g (08/16/22 2153)   PRN Meds: acetaminophen **OR** acetaminophen, albuterol, guaiFENesin-dextromethorphan, morphine injection, ondansetron **OR** ondansetron (ZOFRAN) IV, senna-docusate   Vital Signs    Vitals:   08/17/22 0401 08/17/22 0500 08/17/22 0532 08/17/22 0810  BP: (!) 107/57 (!) 82/43 122/60 (!) 88/49  Pulse:  95  (!) 118  Resp: '16 12 20   '$ Temp:      TempSrc:      SpO2:  100%  100%  Weight:  69.5 kg    Height:        Intake/Output Summary (Last 24 hours) at 08/17/2022 0902 Last data filed at 08/17/2022 0531 Gross per 24 hour  Intake 771.17 ml  Output 700 ml  Net 71.17 ml      08/17/2022    5:00 AM 08/16/2022    4:40 AM 08/15/2022    6:00 AM  Last 3 Weights  Weight (lbs) 153 lb 4.8 oz 152 lb 1.9 oz 151 lb 7.3 oz  Weight (kg) 69.536 kg 69 kg 68.7 kg      Telemetry    Afib RVR 115  - Personally Reviewed  ECG    Afib - Personally Reviewed  Physical Exam   GEN: No acute distress but appears uncomfortable lying in bed Neck: No JVD Cardiac: irregular and tachycardic Respiratory: shallow inspirations, Crackles in lung bases  GI: Soft, nontender, non-distended  MS: No edema; No deformity. Neuro:  Nonfocal  Psych:  Normal affect   Labs    High Sensitivity Troponin:   Recent Labs  Lab 08/13/22 1957 08/16/22 1535 08/16/22 1806  TROPONINIHS '12 14 13     '$ Chemistry Recent Labs  Lab 08/15/22 0024 08/16/22 0023 08/17/22 0027  NA 139 138 135  K 4.2 4.7 4.2  CL 104 104 102  CO2 23 16* 22  GLUCOSE 108* 112* 135*  BUN 38* 49* 50*  CREATININE 2.55* 3.04* 3.17*  CALCIUM 8.4* 8.4* 8.1*  MG 1.3* 2.9* 2.7*  PROT 6.5 6.1* 5.4*  ALBUMIN 3.1* 2.9* 2.6*  AST 12* 15 10*  ALT '11 10 10  '$ ALKPHOS 61 55 45  BILITOT 0.5 0.4 0.5  GFRNONAA 17* 14* 13*  ANIONGAP 12 18* 11    Lipids No results for input(s): "CHOL", "TRIG", "HDL", "LABVLDL", "LDLCALC", "CHOLHDL" in the last 168 hours.  Hematology Recent Labs  Lab 08/15/22 0024 08/16/22 0023 08/17/22 0027  WBC 11.8* 8.6 8.0  RBC 3.63* 3.40* 3.16*  HGB 9.8* 9.3* 8.6*  HCT 32.3* 30.6* 27.5*  MCV 89.0 90.0 87.0  MCH 27.0 27.4 27.2  MCHC 30.3 30.4 31.3  RDW 17.5*  17.7* 17.6*  PLT 193 165 157   Thyroid  Recent Labs  Lab 08/16/22 0023  TSH 3.352    BNPNo results for input(s): "BNP", "PROBNP" in the last 168 hours.  DDimer No results for input(s): "DDIMER" in the last 168 hours.   Radiology    DG CHEST PORT 1 VIEW  Result Date: 08/16/2022 CLINICAL DATA:  Chest pain EXAM: PORTABLE CHEST 1 VIEW COMPARISON:  Chest x-ray dated August 14, 2022; chest CT dated December 18, 2021 FINDINGS: Cardiac and mediastinal contours are unchanged. Basilar predominant airspace opacities, similar to prior exam. Unchanged small scattered bilateral nodular opacities. No large pleural effusion or pneumothorax. IMPRESSION: 1. Basilar predominant opacities, similar to prior exam likely due to atelectasis. 2. Unchanged small scattered bilateral nodular opacities, likely due to chronic atypical infection. Electronically Signed   By: Yetta Glassman M.D.   On: 08/16/2022 08:50    Cardiac Studies   Echocardiogram 08/14/22 1. Left ventricular ejection fraction, by estimation, is  25%. The left  ventricle has severely decreased function. The left ventricle demonstrates  global hypokinesis. Left ventricular diastolic parameters are  indeterminate.   2. Right ventricular systolic function is moderately reduced. The right  ventricular size is normal. There is normal pulmonary artery systolic  pressure. The estimated right ventricular systolic pressure is 94.7 mmHg.   3. Left atrial size was moderately dilated.   4. The mitral valve is abnormal with restricted posterior leaflet.  Moderate mitral valve regurgitation. No evidence of mitral stenosis.   5. Tricuspid valve regurgitation is mild to moderate.   6. The aortic valve is tricuspid. Aortic valve regurgitation is not  visualized. No aortic stenosis is present.   7. The inferior vena cava is normal in size with <50% respiratory  variability, suggesting right atrial pressure of 8 mmHg.   8. The patient was in atrial fibrillation.   Patient Profile     88 y.o. female with a hx of atrial fib followed by [name], last seen 08/2021 also EF 20-25% NICM, delusional disorder, DM-2, GERD now admitted with sepsis who is being seen for the evaluation of atrial fib at the request of Dr. Armandina Gemma.    Assessment & Plan    Paroxysmal Atrial Fibrillation  - Note patient was admitted in 05/2021 with pneumonia, found to have A-fib with RVR.  She was started on Eliquis 2.5 twice daily, underwent cardioversion in 05/2021.  More recently admitted in 06/2022 with pneumonia, maintained normal sinus rhythm that admission - Now presented with shortness of breath. Found to be in afib with RVR - Treated with IV amiodarone and converted to NSR yesterday and then had recurrence of Afib. Now on PO amiodarone, will increase to 400 mg Bid for better loading - consider IV amio 150 mg bolus administered slowly if further rate control needed. - Home metoprolol held due to soft BP while in Afib.  Hold BB with continued hypotension. - Continue Eliquis  2.5 mg twice daily (dose reduced due to age, renal function) - Echo this admission with EF 25%, moderately reduced RV function, moderately dilated LA, moderate mitral valve regurgitation  - Maintain K>4, mag>2    Chronic systolic heart failure - Most recent echocardiogram completed 04/2021 showed EF 20-25%, global hypokinesis, normal RV systolic function - Echo this admission with EF 25%, moderately reduced RV systolic function, moderate mitral valve regurgitation (restricted posterior leaflet)  -Home metoprolol held due to soft BP.  Not on ACE/ARB/ARNI or Arlyce Harman given CKD - Home Lasix held  given AKI on CKD stage IIIb, neph on board   Hypotension - BP soft this admission  - Holding home BP medications  - Patient has received IV fluids - Suspect BP will improve with HR control  - midodrine added by nephrology.  - no current cardiogenic shock however if bp remains low complicating rena function, may need to consider short term inotrope use.   AKI on CKD stage IIIb - baseline approximately 1.3 -Creatinine further increased to 3, and now plateauing per neph. - Holding home BP medications to avoid hypotension. - RN reports that patient has not been eating, possible poor PO intake is contributing. Patient dry on exam    Otherwise Per Primary  - Concern for Aspiration-- SLP eval with mild aspiration risk. - Type 2 DM  - Hypothyroidism   For questions or updates, please contact [place] Please consult [email or web address] for contact info under        Signed, Elouise Munroe, MD  08/17/2022, 9:02 AM

## 2022-08-17 NOTE — Progress Notes (Addendum)
PROGRESS NOTE    Melissa [name]  QBH:[id number] DOB: 1928-08-16 DOA: 08/13/2022 PCP: Darreld Mclean, MD  Chief Complaint  Patient presents with   Code Sepsis    Brief Narrative:  Melissa [name] is Melissa [name] 88 y.o. female with medical history significant for HFrEF (EF 20-25% per TTE 04/13/2021), NICM, PAF on Eliquis, CKD stage IIIb, insulin-dependent T2DM, HTN, hypothyroidism, HLD, anxiety, delusional parasitosis, mild cognitive impairment who is admitted with paroxysmal atrial fibrillation with RVR.  Seen by cardiology and started on IV amiodarone drip.    Assessment & Plan:   Principal Problem:   Paroxysmal atrial fibrillation with rapid ventricular response (HCC) Active Problems:   Acute renal failure superimposed on stage 3b chronic kidney disease (HCC)   Hypotension   Chronic HFrEF (heart failure with reduced ejection fraction) (HCC)   Type 2 diabetes mellitus with diabetic neuropathy, with long-term current use of insulin (HCC)   Hypothyroidism, acquired   PAF (paroxysmal atrial fibrillation) (HCC)   Acute on chronic combined systolic and diastolic CHF (congestive heart failure) (HCC)   Assessment and Plan: Paroxysmal atrial fibrillation with rapid ventricular response ([place]) Presenting with Melissa [name]-fib with RVR, rate 140s.  Blood pressure soft.  Cardiology consulted and started IV amiodarone drip.  Tachycardia, tachypnea, hypotension all likely driven by Melissa [name]-fib with RVR.  No sign of active infection.  Sepsis not ruled in. -continued RVR today -Cardiology following, appreciate assistance - transitioned to PO amiodarone -Update echocardiogram as below -Keep on telemetry -Hold Toprol-XL with soft blood pressure -Continue Eliquis 2.5 mg twice daily   Hypotension Suspect due to Melissa [name]-fib with RVR, hypovolemia?  Given 1 L NS in the ED.  SBP improved at this time.  Possible contribution from infection with pneumonia.   -abx with unasyn as below -Follow blood cultures -Hold antihypertensives -  BP on soft side last night again - checking orthostatics, standing weight - midodrine started per renal  Acute renal failure superimposed on stage 3b chronic kidney disease (HCC) Creatinine 2.42 on admit compared to baseline around 1.3.  Suspected prerenal from hypoperfusion in setting of Melissa [name]-fib with RVR, hypovolemia, hypotension? -creatinine worsening today, 700 cc recorded yesterday, unclear how accurate, weights increasing.  -S/p 1 L NS in the ED.  Gave 50 cc/hr x12 hrs yesterday. -BP is still intermittently soft, not overtly overloaded, but weights rising -> check orthostatics, standing weight - midodrine started per renal -UA with negative protein, 0-5 RBC's -> fena suggestive of intrinsic dz (urine urea nitrogen for FeUrea was canceled)  -Renal ultrasound with chronic medical renal disease -Monitor UOP, follow bladder scan - appreciate renal assistance    Right Sided Pain (shoulder, arm, ear, neck) - unclear cause, will get troponin though would be unusual presentation, lactic  - LFT's unremarkable, has had prior cholecystectomy - low dose morphine, consider additional w/u if doesn't improve with conservative measures  Concern for Aspiration  Abnormal CXR  Wheezing - repeat CXR basilar predominant opacities, unchanged small scattered bilateral nodular opacities - SLP eval -> mild aspiration risk, dysphagia 3/thin liquid diet recommended - albuterol ordered for wheezing  Chronic HFrEF (heart failure with reduced ejection fraction) (HCC) EF 20-25% by last TTE 04/13/2021.  Appears euvolemic but may have some mild pulm edema on CXR.  Received 1 L NS in the ED for hypotension.  Has AKI on CKD stage IIIb. -Obtain echocardiogram -> EF 25%, global hypokinesis, moderately reduced RVSF (see report) -Hold Lasix for now, monitor strict I/O's and daily weights -cautious fluids with AKI given  above -Hold Toprol-XL given hypotension -Not currently on ACE/ARB/Spiro; would not add given  AKI -Cardiology following - strict I/O, daily weights (requested standing weight, daughter notes weights at home ~140 lbs, she's been around 150 lbs and gaining the last few days)   Type 2 diabetes mellitus with diabetic neuropathy, with long-term current use of insulin ([place]) Placed on SSI.  Reduced dose basal ordered. A1c 8/5 (05/2022) BG's appropriate   Hypothyroidism, acquired Continue home dose Synthroid 50 mcg daily.  Check TSH (3.5 wnl).   Hypomagnesemia Replace and follow  Anemia Suspect component of dilution, trend     DVT prophylaxis: eliquis Code Status: full Family Communication: none at bedside - called daughter, 11/3 Disposition:   Status is: Inpatient Remains inpatient appropriate because: continued need for inpatient management   Consultants:  cardiology  Procedures:  Echo IMPRESSIONS     1. Left ventricular ejection fraction, by estimation, is 25%. The left  ventricle has severely decreased function. The left ventricle demonstrates  global hypokinesis. Left ventricular diastolic parameters are  indeterminate.   2. Right ventricular systolic function is moderately reduced. The right  ventricular size is normal. There is normal pulmonary artery systolic  pressure. The estimated right ventricular systolic pressure is 78.2 mmHg.   3. Left atrial size was moderately dilated.   4. The mitral valve is abnormal with restricted posterior leaflet.  Moderate mitral valve regurgitation. No evidence of mitral stenosis.   5. Tricuspid valve regurgitation is mild to moderate.   6. The aortic valve is tricuspid. Aortic valve regurgitation is not  visualized. No aortic stenosis is present.   7. The inferior vena cava is normal in size with <50% respiratory  variability, suggesting right atrial pressure of 8 mmHg.   8. The patient was in atrial fibrillation.    Antimicrobials:  Anti-infectives (From admission, onward)    Start     Dose/Rate Route Frequency  Ordered Stop   08/14/22 1915  ampicillin-sulbactam (UNASYN) 1.5 g in sodium chloride 0.9 % 100 mL IVPB        1.5 g 200 mL/hr over 30 Minutes Intravenous Every 12 hours 08/14/22 1817 08/19/22 2159   08/13/22 1923  vancomycin variable dose per unstable renal function (pharmacist dosing)  Status:  Discontinued         Does not apply See admin instructions 08/13/22 1924 08/13/22 2105   08/13/22 1830  vancomycin (VANCOREADY) IVPB 1250 mg/250 mL        1,250 mg 166.7 mL/hr over 90 Minutes Intravenous  Once 08/13/22 1828 08/13/22 2040   08/13/22 1815  ceFEPIme (MAXIPIME) 2 g in sodium chloride 0.9 % 100 mL IVPB        2 g 200 mL/hr over 30 Minutes Intravenous  Once 08/13/22 1804 08/13/22 1844       Subjective: Sleeping, no complaints when I wake her  Feeling ok so far  Objective: Vitals:   08/17/22 0401 08/17/22 0500 08/17/22 0532 08/17/22 0810  BP: (!) 107/57 (!) 82/43 122/60 (!) 88/49  Pulse:  95  (!) 118  Resp: '16 12 20   '$ Temp:      TempSrc:      SpO2:  100%  100%  Weight:  69.5 kg    Height:        Intake/Output Summary (Last 24 hours) at 08/17/2022 0832 Last data filed at 08/17/2022 0531 Gross per 24 hour  Intake 771.17 ml  Output 700 ml  Net 71.17 ml   Filed Weights   08/15/22  0600 08/16/22 0440 08/17/22 0500  Weight: 68.7 kg 69 kg 69.5 kg    Examination:  General: No acute distress. HEENT: otoscope used yesterday given ear pain, TM's obscured by cerumen,  Cardiovascular: irregularly irregular, tach Lungs: Clear to auscultation bilaterally - no wheezing this morning Abdomen: Soft, nontender, nondistended Neurological: Alert and oriented 3. Moves all extremities 4 with equal strength. Cranial nerves II through XII grossly intact. Extremities: No clubbing or cyanosis. No edema.  Data Reviewed: I have personally reviewed following labs and imaging studies  CBC: Recent Labs  Lab 08/13/22 1740 08/14/22 0518 08/15/22 0024 08/16/22 0023 08/17/22 0027  WBC 7.0  7.1 11.8* 8.6 8.0  NEUTROABS 4.6  --  9.5* 6.2 5.6  HGB 10.1* 9.5* 9.8* 9.3* 8.6*  HCT 31.9* 31.0* 32.3* 30.6* 27.5*  MCV 88.1 89.1 89.0 90.0 87.0  PLT 182 178 193 165 630    Basic Metabolic Panel: Recent Labs  Lab 08/13/22 1740 08/13/22 1957 08/14/22 0518 08/15/22 0024 08/16/22 0023 08/17/22 0027  NA 140  --  139 139 138 135  K 3.9  --  4.5 4.2 4.7 4.2  CL 105  --  105 104 104 102  CO2 22  --  21* 23 16* 22  GLUCOSE 175*  --  226* 108* 112* 135*  BUN 37*  --  36* 38* 49* 50*  CREATININE 2.42*  --  2.53* 2.55* 3.04* 3.17*  CALCIUM 8.5*  --  7.9* 8.4* 8.4* 8.1*  MG  --  1.2* 1.1* 1.3* 2.9* 2.7*  PHOS  --   --   --  4.7* 4.9* 4.8*    GFR: Estimated Creatinine Clearance: 10.8 mL/min (Zakariah Urwin) (by C-G formula based on SCr of 3.17 mg/dL (H)).  Liver Function Tests: Recent Labs  Lab 08/13/22 1740 08/14/22 0518 08/15/22 0024 08/16/22 0023 08/17/22 0027  AST 12* 13* 12* 15 10*  ALT '8 9 11 10 10  '$ ALKPHOS 68 62 61 55 45  BILITOT 0.4 0.4 0.5 0.4 0.5  PROT 6.7 6.2* 6.5 6.1* 5.4*  ALBUMIN 3.2* 2.9* 3.1* 2.9* 2.6*    CBG: Recent Labs  Lab 08/16/22 0624 08/16/22 1122 08/16/22 1648 08/16/22 2143 08/17/22 0633  GLUCAP 112* 147* 118* 137* 133*     Recent Results (from the past 240 hour(s))  Culture, blood (Routine x 2)     Status: None (Preliminary result)   Collection Time: 08/13/22  5:40 PM   Specimen: BLOOD  Result Value Ref Range Status   Specimen Description BLOOD RIGHT ANTECUBITAL  Final   Special Requests   Final    BOTTLES DRAWN AEROBIC AND ANAEROBIC Blood Culture adequate volume   Culture   Final    NO GROWTH 4 DAYS Performed at [facility], [place] [address], [place], [place] [address]    Report Status PENDING  Incomplete  Culture, blood (Routine x 2)     Status: None (Preliminary result)   Collection Time: 08/13/22  5:50 PM   Specimen: BLOOD RIGHT HAND  Result Value Ref Range Status   Specimen Description BLOOD RIGHT HAND  Final   Special Requests    Final    BOTTLES DRAWN AEROBIC AND ANAEROBIC Blood Culture results may not be optimal due to an inadequate volume of blood received in culture bottles   Culture   Final    NO GROWTH 4 DAYS Performed at [facility], [place] [address] [place],  [address]    Report Status PENDING  Incomplete  MRSA  Next Gen by PCR, Nasal     Status: None   Collection Time: 08/13/22  6:29 PM   Specimen: Nasal Mucosa; Nasal Swab  Result Value Ref Range Status   MRSA by PCR Next Gen NOT DETECTED NOT DETECTED Final    Comment: (NOTE) The GeneXpert MRSA Assay (FDA approved for NASAL specimens only), is one component of [facility] comprehensive MRSA colonization surveillance program. It is not intended to diagnose MRSA infection nor to guide or monitor treatment for MRSA infections. Test performance is not FDA approved in patients less than 63 years old. Performed at [facility], [place] [address], [place], [place] [address]          Radiology Studies: DG CHEST PORT 1 VIEW  Result Date: 08/16/2022 CLINICAL DATA:  Chest pain EXAM: PORTABLE CHEST 1 VIEW COMPARISON:  Chest x-ray dated August 14, 2022; chest CT dated December 18, 2021 FINDINGS: Cardiac and mediastinal contours are unchanged. Basilar predominant airspace opacities, similar to prior exam. Unchanged small scattered bilateral nodular opacities. No large pleural effusion or pneumothorax. IMPRESSION: 1. Basilar predominant opacities, similar to prior exam likely due to atelectasis. 2. Unchanged small scattered bilateral nodular opacities, likely due to chronic atypical infection. Electronically Signed   By: Yetta Glassman M.D.   On: 08/16/2022 08:50        Scheduled Meds:  amiodarone  200 mg Oral BID   [START ON 08/22/2022] amiodarone  200 mg Oral Daily   apixaban  2.5 mg Oral BID   FLUoxetine  20 mg Oral Q breakfast   insulin aspart  0-9 Units Subcutaneous TID WC   insulin detemir  12 Units Subcutaneous Daily   levothyroxine  50 mcg  Oral QAC breakfast   midodrine  5 mg Oral TID WC   pramipexole  0.5 mg Oral QHS   QUEtiapine  100 mg Oral QHS   sodium chloride flush  3 mL Intravenous Q12H   Continuous Infusions:  ampicillin-sulbactam (UNASYN) IV 1.5 g (08/16/22 2153)     LOS: 4 days    Time spent: over 30 min    Fayrene Helper, MD [facility]   To contact the attending provider between 7A-7P or the covering provider during after hours 7P-7A, please log into the web site [email or web address] and access using universal [place] password for that web site. If you do not have the password, please call the hospital operator.  08/17/2022, 8:32 AM

## 2022-08-17 NOTE — Progress Notes (Addendum)
Mobility Specialist Progress Note    08/17/22 1208  Mobility  Activity Ambulated with assistance in hallway  Level of Assistance Minimal assist, patient does 75% or more  Assistive Device Front wheel walker  Distance Ambulated (ft) 30 ft  Activity Response Tolerated fair  Mobility Referral Yes  $Mobility charge 1 Mobility   Pre-Mobility: 88 HR, 95/50 (66) BP, 100% SpO2 During Mobility: 119 HR Post-Mobility: 106 HR, 117/86 (96) BP, 100% SpO2  Pt received in bed and agreeable. C/o being weak. On 4LO2. Attempted void on [facility] but just passed flatus. In hall, pt had significant LOB requiring assist to recover. Stated she felt lightheaded and dizzy. Took a seated rest break and rolled back to room on rollator. Returned to bed with call bell in reach. Pt stated she is not feeling well. RN notified.   Hildred Alamin Mobility Specialist  Secure Chat Only

## 2022-08-17 NOTE — Progress Notes (Signed)
Patient BP ranging from 82/43 to 107/57 this morning.  MD notified.

## 2022-08-18 ENCOUNTER — Inpatient Hospital Stay (HOSPITAL_COMMUNITY): Payer: Medicare Other

## 2022-08-18 DIAGNOSIS — I480 Paroxysmal atrial fibrillation: Secondary | ICD-10-CM | POA: Diagnosis not present

## 2022-08-18 LAB — CBC WITH DIFFERENTIAL/PLATELET
Abs Immature Granulocytes: 0.01 10*3/uL (ref 0.00–0.07)
Basophils Absolute: 0 10*3/uL (ref 0.0–0.1)
Basophils Relative: 1 %
Eosinophils Absolute: 0.3 10*3/uL (ref 0.0–0.5)
Eosinophils Relative: 8 %
HCT: 27.3 % — ABNORMAL LOW (ref 36.0–46.0)
Hemoglobin: 8.5 g/dL — ABNORMAL LOW (ref 12.0–15.0)
Immature Granulocytes: 0 %
Lymphocytes Relative: 20 %
Lymphs Abs: 0.9 10*3/uL (ref 0.7–4.0)
MCH: 26.7 pg (ref 26.0–34.0)
MCHC: 31.1 g/dL (ref 30.0–36.0)
MCV: 85.8 fL (ref 80.0–100.0)
Monocytes Absolute: 0.6 10*3/uL (ref 0.1–1.0)
Monocytes Relative: 13 %
Neutro Abs: 2.5 10*3/uL (ref 1.7–7.7)
Neutrophils Relative %: 58 %
Platelets: 145 10*3/uL — ABNORMAL LOW (ref 150–400)
RBC: 3.18 MIL/uL — ABNORMAL LOW (ref 3.87–5.11)
RDW: 17.4 % — ABNORMAL HIGH (ref 11.5–15.5)
WBC: 4.4 10*3/uL (ref 4.0–10.5)
nRBC: 0 % (ref 0.0–0.2)

## 2022-08-18 LAB — MAGNESIUM: Magnesium: 2.8 mg/dL — ABNORMAL HIGH (ref 1.7–2.4)

## 2022-08-18 LAB — COMPREHENSIVE METABOLIC PANEL
ALT: 10 U/L (ref 0–44)
AST: 9 U/L — ABNORMAL LOW (ref 15–41)
Albumin: 2.5 g/dL — ABNORMAL LOW (ref 3.5–5.0)
Alkaline Phosphatase: 49 U/L (ref 38–126)
Anion gap: 7 (ref 5–15)
BUN: 51 mg/dL — ABNORMAL HIGH (ref 8–23)
CO2: 25 mmol/L (ref 22–32)
Calcium: 8.1 mg/dL — ABNORMAL LOW (ref 8.9–10.3)
Chloride: 102 mmol/L (ref 98–111)
Creatinine, Ser: 3.19 mg/dL — ABNORMAL HIGH (ref 0.44–1.00)
GFR, Estimated: 13 mL/min — ABNORMAL LOW (ref >60)
Glucose, Bld: 81 mg/dL (ref 70–99)
Potassium: 4.2 mmol/L (ref 3.5–5.1)
Sodium: 134 mmol/L — ABNORMAL LOW (ref 135–145)
Total Bilirubin: 0.4 mg/dL (ref 0.3–1.2)
Total Protein: 5.4 g/dL — ABNORMAL LOW (ref 6.5–8.1)

## 2022-08-18 LAB — GLUCOSE, CAPILLARY
Glucose-Capillary: 110 mg/dL — ABNORMAL HIGH (ref 70–99)
Glucose-Capillary: 167 mg/dL — ABNORMAL HIGH (ref 70–99)
Glucose-Capillary: 150 mg/dL — ABNORMAL HIGH (ref 70–99)
Glucose-Capillary: 168 mg/dL — ABNORMAL HIGH (ref 70–99)

## 2022-08-18 LAB — CULTURE, BLOOD (ROUTINE X 2)
Culture: NO GROWTH
Culture: NO GROWTH
Special Requests: ADEQUATE

## 2022-08-18 LAB — PHOSPHORUS: Phosphorus: 5 mg/dL — ABNORMAL HIGH (ref 2.5–4.6)

## 2022-08-18 MED ORDER — INSULIN DETEMIR 100 UNIT/ML ~~LOC~~ SOLN
9.0000 [IU] | Freq: Every day | SUBCUTANEOUS | Status: DC
  Administered 2022-08-18 – 2022-08-25 (×8): 9 [IU] via SUBCUTANEOUS
  Filled 2022-08-18 (×8): qty 0.09

## 2022-08-18 MED ORDER — SODIUM CHLORIDE 0.9 % IV BOLUS
500.0000 mL | Freq: Once | INTRAVENOUS | Status: AC
  Administered 2022-08-18: 500 mL via INTRAVENOUS

## 2022-08-18 MED ORDER — SENNOSIDES-DOCUSATE SODIUM 8.6-50 MG PO TABS
1.0000 | ORAL_TABLET | Freq: Every day | ORAL | Status: DC
  Administered 2022-08-18 – 2022-08-24 (×7): 1 via ORAL
  Filled 2022-08-18 (×7): qty 1

## 2022-08-18 MED ORDER — POLYETHYLENE GLYCOL 3350 17 G PO PACK
17.0000 g | PACK | Freq: Two times a day (BID) | ORAL | Status: DC
  Administered 2022-08-18 – 2022-08-25 (×10): 17 g via ORAL
  Filled 2022-08-18 (×13): qty 1

## 2022-08-18 NOTE — Progress Notes (Addendum)
PROGRESS NOTE    Melissa [name]  EXB:[id number] DOB: 04/08/1928 DOA: 08/13/2022 PCP: Darreld Mclean, MD  Chief Complaint  Patient presents with   Code Sepsis    Brief Narrative:  Melissa [name] is Melissa [name] 88 y.o. female with medical history significant for HFrEF (EF 20-25% per TTE 04/13/2021), NICM, PAF on Eliquis, CKD stage IIIb, insulin-dependent T2DM, HTN, hypothyroidism, HLD, anxiety, delusional parasitosis, mild cognitive impairment who is admitted with paroxysmal atrial fibrillation with RVR.  Seen by cardiology and started on IV amiodarone drip.    Assessment & Plan:   Principal Problem:   Paroxysmal atrial fibrillation with rapid ventricular response (HCC) Active Problems:   Acute renal failure superimposed on stage 3b chronic kidney disease (HCC)   Hypotension   Chronic HFrEF (heart failure with reduced ejection fraction) (HCC)   Type 2 diabetes mellitus with diabetic neuropathy, with long-term current use of insulin (HCC)   Hypothyroidism, acquired   PAF (paroxysmal atrial fibrillation) (HCC)   Acute on chronic combined systolic and diastolic CHF (congestive heart failure) (HCC)   Assessment and Plan: Paroxysmal atrial fibrillation with rapid ventricular response (Melissa [name]) Presenting with Melissa [name]-fib with RVR, rate 140s.  Blood pressure soft.  Cardiology consulted and started IV amiodarone drip.  Tachycardia, tachypnea, hypotension all likely driven by Melissa [name]-fib with RVR.  No sign of active infection.  Sepsis not ruled in. -continued RVR today -Cardiology following, appreciate assistance - transitioned to PO amiodarone, consider metop if BP's stable -Update echocardiogram as below -Keep on telemetry -Hold Toprol-XL with soft blood pressure -Continue Eliquis 2.5 mg twice daily   Hypotension Suspect due to Melissa [name]-fib with RVR, hypovolemia?  Given 1 L NS in the ED.  SBP improved at this time.  Possible contribution from infection with pneumonia.   -abx with unasyn as below -Follow blood  cultures -BP's improved with midodrine, follow  Delirium Delirium precautions W/u additionally prn Continue seroquel  Acute renal failure superimposed on stage 3b chronic kidney disease ([place])  Acute Urinary Retention Creatinine 2.42 on admit compared to baseline around 1.3.  Suspected prerenal from hypoperfusion in setting of Melissa [name]-fib with RVR, hypovolemia, hypotension? -creatinine worsening today, 800 cc recorded yesterday, unclear how accurate, weights increasing.  -S/p 1 L NS in the ED.  Gave 50 cc/hr x12 hrs yesterday. -BP is still intermittently soft, not overtly overloaded, but weights rising -UA with negative protein, 0-5 RBC's -> fena suggestive of intrinsic dz (urine urea nitrogen for FeUrea was canceled)  -Renal ultrasound with chronic medical renal disease -s/p I&O x2 during this hospitalization, for recurrent retention would place foley.  Try to get her up to use bedside commode during the day. - appreciate renal assistance    Acute on Chronic Hypoxic Respiratory failure - on chronic supplemental O2 (3 L [place]), will wean as able  Right Sided Pain (shoulder, arm, ear, neck) - troponin, lactic wnl - LFT's unremarkable, has had prior cholecystectomy - low dose morphine, consider additional w/u if doesn't improve with conservative measures  Concern for Aspiration  Abnormal CXR  Wheezing - repeat CXR basilar predominant opacities, unchanged small scattered bilateral nodular opacities - SLP eval -> mild aspiration risk, dysphagia 3/thin liquid diet recommended - albuterol ordered for wheezing, flutter valve  Chronic HFrEF (heart failure with reduced ejection fraction) (HCC) EF 20-25% by last TTE 04/13/2021.  Appears euvolemic but may have some mild pulm edema on CXR.  Received 1 L NS in the ED for hypotension.  Has AKI on CKD stage IIIb. -Obtain echocardiogram ->  EF 25%, global hypokinesis, moderately reduced RVSF (see report) -Hold Lasix for now, monitor strict I/O's and daily  weights -cautious fluids with AKI given above - bolus per renal -Hold Toprol-XL given hypotension -Not currently on ACE/ARB/Spiro; would not add given AKI -Cardiology following - strict I/O, daily weights (requested standing weight, daughter notes weights at home ~140 lbs, she's been around 150 lbs and gaining the last few days)   Type 2 diabetes mellitus with diabetic neuropathy, with long-term current use of insulin ([place]) Placed on SSI.  Reduced dose basal ordered - will reduce given BG in 70's yesterday PM. A1c 8/5 (05/2022) BG's appropriate   Hypothyroidism, acquired Continue home dose Synthroid 50 mcg daily.  Check TSH (3.5 wnl).   Hypomagnesemia Replace and follow  Anemia Suspect component of dilution, trend Anemia labs    DVT prophylaxis: eliquis Code Status: full Family Communication: none at bedside - called daughter 11/5 Disposition:   Status is: Inpatient Remains inpatient appropriate because: continued need for inpatient management   Consultants:  cardiology  Procedures:  Echo IMPRESSIONS     1. Left ventricular ejection fraction, by estimation, is 25%. The left  ventricle has severely decreased function. The left ventricle demonstrates  global hypokinesis. Left ventricular diastolic parameters are  indeterminate.   2. Right ventricular systolic function is moderately reduced. The right  ventricular size is normal. There is normal pulmonary artery systolic  pressure. The estimated right ventricular systolic pressure is 09.3 mmHg.   3. Left atrial size was moderately dilated.   4. The mitral valve is abnormal with restricted posterior leaflet.  Moderate mitral valve regurgitation. No evidence of mitral stenosis.   5. Tricuspid valve regurgitation is mild to moderate.   6. The aortic valve is tricuspid. Aortic valve regurgitation is not  visualized. No aortic stenosis is present.   7. The inferior vena cava is normal in size with <50% respiratory   variability, suggesting right atrial pressure of 8 mmHg.   8. The patient was in atrial fibrillation.    Antimicrobials:  Anti-infectives (From admission, onward)    Start     Dose/Rate Route Frequency Ordered Stop   08/14/22 1915  ampicillin-sulbactam (UNASYN) 1.5 g in sodium chloride 0.9 % 100 mL IVPB        1.5 g 200 mL/hr over 30 Minutes Intravenous Every 12 hours 08/14/22 1817 08/19/22 2159   08/13/22 1923  vancomycin variable dose per unstable renal function (pharmacist dosing)  Status:  Discontinued         Does not apply See admin instructions 08/13/22 1924 08/13/22 2105   08/13/22 1830  vancomycin (VANCOREADY) IVPB 1250 mg/250 mL        1,250 mg 166.7 mL/hr over 90 Minutes Intravenous  Once 08/13/22 1828 08/13/22 2040   08/13/22 1815  ceFEPIme (MAXIPIME) 2 g in sodium chloride 0.9 % 100 mL IVPB        2 g 200 mL/hr over 30 Minutes Intravenous  Once 08/13/22 1804 08/13/22 1844       Subjective: Had trouble sleeping last night  Objective: Vitals:   08/17/22 1639 08/17/22 1902 08/18/22 0500 08/18/22 0715  BP: (!) 117/94 104/78  131/89  Pulse: 98 (!) 108  (!) 111  Resp: '18 20  18  '$ Temp: 97.6 F (36.4 C) (!) 97.4 F (36.3 C)  97.6 F (36.4 C)  TempSrc: Oral Oral  Oral  SpO2: 98% 100%  100%  Weight:   70.8 kg   Height:  Intake/Output Summary (Last 24 hours) at 08/18/2022 0854 Last data filed at 08/18/2022 0650 Gross per 24 hour  Intake 118 ml  Output 800 ml  Net -682 ml   Filed Weights   08/17/22 0500 08/17/22 0942 08/18/22 0500  Weight: 69.5 kg 70 kg 70.8 kg    Examination:  General: No acute distress. Cardiovascular: irregularly irregular, tachy Lungs: unlabored Abdomen: Soft, nontender, nondistended  Neurological: Alert and oriented 3. Moves all extremities 4 with equal strength. Cranial nerves II through XII grossly intact. Extremities: No clubbing or cyanosis. No edema.   Data Reviewed: I have personally reviewed following labs and  imaging studies  CBC: Recent Labs  Lab 08/13/22 1740 08/14/22 0518 08/15/22 0024 08/16/22 0023 08/17/22 0027 08/18/22 0021  WBC 7.0 7.1 11.8* 8.6 8.0 4.4  NEUTROABS 4.6  --  9.5* 6.2 5.6 2.5  HGB 10.1* 9.5* 9.8* 9.3* 8.6* 8.5*  HCT 31.9* 31.0* 32.3* 30.6* 27.5* 27.3*  MCV 88.1 89.1 89.0 90.0 87.0 85.8  PLT 182 178 193 165 157 145*    Basic Metabolic Panel: Recent Labs  Lab 08/14/22 0518 08/15/22 0024 08/16/22 0023 08/17/22 0027 08/18/22 0021  NA 139 139 138 135 134*  K 4.5 4.2 4.7 4.2 4.2  CL 105 104 104 102 102  CO2 21* 23 16* 22 25  GLUCOSE 226* 108* 112* 135* 81  BUN 36* 38* 49* 50* 51*  CREATININE 2.53* 2.55* 3.04* 3.17* 3.19*  CALCIUM 7.9* 8.4* 8.4* 8.1* 8.1*  MG 1.1* 1.3* 2.9* 2.7* 2.8*  PHOS  --  4.7* 4.9* 4.8* 5.0*    GFR: Estimated Creatinine Clearance: 10.9 mL/min (Melissa [name]) (by C-G formula based on SCr of 3.19 mg/dL (H)).  Liver Function Tests: Recent Labs  Lab 08/14/22 0518 08/15/22 0024 08/16/22 0023 08/17/22 0027 08/18/22 0021  AST 13* 12* 15 10* 9*  ALT '9 11 10 10 10  '$ ALKPHOS 62 61 55 45 49  BILITOT 0.4 0.5 0.4 0.5 0.4  PROT 6.2* 6.5 6.1* 5.4* 5.4*  ALBUMIN 2.9* 3.1* 2.9* 2.6* 2.5*    CBG: Recent Labs  Lab 08/17/22 0633 08/17/22 1102 08/17/22 1637 08/17/22 2112 08/18/22 0616  GLUCAP 133* 175* 71 105* 110*     Recent Results (from the past 240 hour(s))  Culture, blood (Routine x 2)     Status: None   Collection Time: 08/13/22  5:40 PM   Specimen: BLOOD  Result Value Ref Range Status   Specimen Description BLOOD RIGHT ANTECUBITAL  Final   Special Requests   Final    BOTTLES DRAWN AEROBIC AND ANAEROBIC Blood Culture adequate volume   Culture   Final    NO GROWTH 5 DAYS Performed at [facility], [place] [address] [place], [place] [address]    Report Status 08/18/2022 FINAL  Final  Culture, blood (Routine x 2)     Status: None   Collection Time: 08/13/22  5:50 PM   Specimen: BLOOD RIGHT HAND  Result Value Ref Range Status    Specimen Description BLOOD RIGHT HAND  Final   Special Requests   Final    BOTTLES DRAWN AEROBIC AND ANAEROBIC Blood Culture results may not be optimal due to an inadequate volume of blood received in culture bottles   Culture   Final    NO GROWTH 5 DAYS Performed at [facility], [place] [address], [place], [place] [address]    Report Status 08/18/2022 FINAL  Final  MRSA Next Gen by PCR, Nasal  Status: None   Collection Time: 08/13/22  6:29 PM   Specimen: Nasal Mucosa; Nasal Swab  Result Value Ref Range Status   MRSA by PCR Next Gen NOT DETECTED NOT DETECTED Final    Comment: (NOTE) The GeneXpert MRSA Assay (FDA approved for NASAL specimens only), is one component of Melissa [name] comprehensive MRSA colonization surveillance program. It is not intended to diagnose MRSA infection nor to guide or monitor treatment for MRSA infections. Test performance is not FDA approved in patients less than 35 years old. Performed at [facility], [place] [address] [place],  [address]          Radiology Studies: No results found.      Scheduled Meds:  amiodarone  400 mg Oral BID   apixaban  2.5 mg Oral BID   FLUoxetine  20 mg Oral Q breakfast   insulin aspart  0-9 Units Subcutaneous TID WC   insulin detemir  12 Units Subcutaneous Daily   levothyroxine  50 mcg Oral QAC breakfast   midodrine  5 mg Oral TID WC   pramipexole  0.5 mg Oral QHS   QUEtiapine  100 mg Oral QHS   sodium chloride flush  3 mL Intravenous Q12H   Continuous Infusions:  ampicillin-sulbactam (UNASYN) IV 1.5 g (08/17/22 2057)     LOS: 5 days    Time spent: over 30 min    Melissa Helper, MD [facility]   To contact the attending provider between 7A-7P or the covering provider during after hours 7P-7A, please log into the web site [email or web address] and access using universal [place] password for that web site. If you do not have the password, please call the hospital operator.  08/18/2022,  8:54 AM

## 2022-08-18 NOTE — Progress Notes (Signed)
Rounding Note    Patient Name: Melissa [name] Date of Encounter: 08/18/2022  [place] Cardiologist: [name], MD   Subjective   Somewhat delirious this am. Feels some abd discomfort. No chest pain.   Inpatient Medications    Scheduled Meds:  amiodarone  400 mg Oral BID   apixaban  2.5 mg Oral BID   FLUoxetine  20 mg Oral Q breakfast   insulin aspart  0-9 Units Subcutaneous TID WC   insulin detemir  12 Units Subcutaneous Daily   levothyroxine  50 mcg Oral QAC breakfast   midodrine  5 mg Oral TID WC   pramipexole  0.5 mg Oral QHS   QUEtiapine  100 mg Oral QHS   sodium chloride flush  3 mL Intravenous Q12H   Continuous Infusions:  ampicillin-sulbactam (UNASYN) IV 1.5 g (08/17/22 2057)   PRN Meds: acetaminophen **OR** acetaminophen, albuterol, guaiFENesin-dextromethorphan, morphine injection, ondansetron **OR** ondansetron (ZOFRAN) IV, senna-docusate   Vital Signs    Vitals:   08/17/22 1639 08/17/22 1902 08/18/22 0500 08/18/22 0715  BP: (!) 117/94 104/78  131/89  Pulse: 98 (!) 108  (!) 111  Resp: '18 20  18  '$ Temp: 97.6 F (36.4 C) (!) 97.4 F (36.3 C)  97.6 F (36.4 C)  TempSrc: Oral Oral  Oral  SpO2: 98% 100%  100%  Weight:   70.8 kg   Height:        Intake/Output Summary (Last 24 hours) at 08/18/2022 0808 Last data filed at 08/18/2022 0650 Gross per 24 hour  Intake 118 ml  Output 800 ml  Net -682 ml      08/18/2022    5:00 AM 08/17/2022    9:42 AM 08/17/2022    5:00 AM  Last 3 Weights  Weight (lbs) 156 lb 1.4 oz 154 lb 4.8 oz 153 lb 4.8 oz  Weight (kg) 70.8 kg 69.99 kg 69.536 kg      Telemetry    Afib RVR 100-110 - Personally Reviewed  ECG    Afib - Personally Reviewed  Physical Exam   GEN: No acute distress but appears uncomfortable lying in bed Neck: No JVD Cardiac: irregular and tachycardic Respiratory: shallow inspirations, Crackles in lung bases  GI: Soft, nontender, non-distended  MS: No edema; No deformity. Neuro:   Nonfocal  Psych: Normal affect   Labs    High Sensitivity Troponin:   Recent Labs  Lab 08/13/22 1957 08/16/22 1535 08/16/22 1806  TROPONINIHS '12 14 13     '$ Chemistry Recent Labs  Lab 08/16/22 0023 08/17/22 0027 08/18/22 0021  NA 138 135 134*  K 4.7 4.2 4.2  CL 104 102 102  CO2 16* 22 25  GLUCOSE 112* 135* 81  BUN 49* 50* 51*  CREATININE 3.04* 3.17* 3.19*  CALCIUM 8.4* 8.1* 8.1*  MG 2.9* 2.7* 2.8*  PROT 6.1* 5.4* 5.4*  ALBUMIN 2.9* 2.6* 2.5*  AST 15 10* 9*  ALT '10 10 10  '$ ALKPHOS 55 45 49  BILITOT 0.4 0.5 0.4  GFRNONAA 14* 13* 13*  ANIONGAP 18* 11 7    Lipids No results for input(s): "CHOL", "TRIG", "HDL", "LABVLDL", "LDLCALC", "CHOLHDL" in the last 168 hours.  Hematology Recent Labs  Lab 08/16/22 0023 08/17/22 0027 08/18/22 0021  WBC 8.6 8.0 4.4  RBC 3.40* 3.16* 3.18*  HGB 9.3* 8.6* 8.5*  HCT 30.6* 27.5* 27.3*  MCV 90.0 87.0 85.8  MCH 27.4 27.2 26.7  MCHC 30.4 31.3 31.1  RDW 17.7* 17.6* 17.4*  PLT 165  157 145*   Thyroid  Recent Labs  Lab 08/16/22 0023  TSH 3.352    BNPNo results for input(s): "BNP", "PROBNP" in the last 168 hours.  DDimer No results for input(s): "DDIMER" in the last 168 hours.   Radiology    No results found.  Cardiac Studies   Echocardiogram 08/14/22 1. Left ventricular ejection fraction, by estimation, is 25%. The left  ventricle has severely decreased function. The left ventricle demonstrates  global hypokinesis. Left ventricular diastolic parameters are  indeterminate.   2. Right ventricular systolic function is moderately reduced. The right  ventricular size is normal. There is normal pulmonary artery systolic  pressure. The estimated right ventricular systolic pressure is 16.0 mmHg.   3. Left atrial size was moderately dilated.   4. The mitral valve is abnormal with restricted posterior leaflet.  Moderate mitral valve regurgitation. No evidence of mitral stenosis.   5. Tricuspid valve regurgitation is mild to  moderate.   6. The aortic valve is tricuspid. Aortic valve regurgitation is not  visualized. No aortic stenosis is present.   7. The inferior vena cava is normal in size with <50% respiratory  variability, suggesting right atrial pressure of 8 mmHg.   8. The patient was in atrial fibrillation.   Patient Profile     88 y.o. female with a hx of atrial fib followed by [name], last seen 08/2021 also EF 20-25% NICM, delusional disorder, DM-2, GERD now admitted with sepsis, cardiology consulted for management of paroxysmal afib. Had brief sinus rhythm in hospital but back in afib with RVR and hypotension.   Assessment & Plan    Paroxysmal Atrial Fibrillation  - Note patient was admitted in 05/2021 with pneumonia, found to have A-fib with RVR.  She was started on Eliquis 2.5 twice daily, underwent cardioversion in 05/2021.  More recently admitted in 06/2022 with pneumonia, maintained normal sinus rhythm that admission - Now presented with shortness of breath. Found to be in afib with RVR - Treated with IV amiodarone and converted to NSR yesterday and then had recurrence of Afib.  - Now on PO amiodarone 400 mg Bid for better loading, seems to be helping. - consider IV amio 150 mg bolus administered slowly if further rate control needed. - Home metoprolol held due to soft BP while in Afib. BP is better with better rate control and addition of midodrine per neph. - Consider adding low dose metoprolol tomorrow for rate control if BP remains stable today. - Continue Eliquis 2.5 mg twice daily (dose reduced due to age, renal function) - Echo this admission with EF 25%, moderately reduced RV function, moderately dilated LA, moderate mitral valve regurgitation  - Maintain K>4, mag>2    Chronic systolic heart failure - Most recent echocardiogram completed 04/2021 showed EF 20-25%, global hypokinesis, normal RV systolic function - Echo this admission with EF 25%, moderately reduced RV systolic function,  moderate mitral valve regurgitation (restricted posterior leaflet)  -Home metoprolol held due to soft BP.  Not on ACE/ARB/ARNI or Arlyce Harman given CKD - Home Lasix held given AKI on CKD stage IIIb, neph on board   Hypotension - BP soft this admission  - Holding home BP medications  - Patient has received IV fluids - Suspect BP will improve with HR control, has improved with midodrine. - midodrine added by nephrology.  - no current cardiogenic shock however if bp remains low complicating renal function, may need to consider short term inotrope use.   AKI on CKD stage  IIIb - baseline approximately 1.3 -Creatinine further increased to 3, and now plateauing per neph. - Holding home BP medications to avoid hypotension. - possible poor PO intake is contributing.   Otherwise Per Primary  - Concern for Aspiration-- SLP eval with mild aspiration risk. - Type 2 DM  - Hypothyroidism   For questions or updates, please contact [place] Please consult [email or web address] for contact info under        Signed, Elouise Munroe, MD  08/18/2022, 8:08 AM

## 2022-08-18 NOTE — Progress Notes (Signed)
[facility] Progress Note   Assessment/ Plan:    AKI on CKD 3b: likely in the setting of hypoperfusion, Afib with RVR, sepsis, ongoing hypotension             - looks like Cr is trying to plateau             - poor PO intake yesterday-- small NS bolus 500 mL today             - renal US- no hydro 08/13/22             - persistent hypotension in the setting of RV and LV dysfunction- added midodrine 5 mg TID, BP are better   2.  Afib with RVR:             - controlled now,  on amio and Eliquis   3.  Sepsis:             - likely d/t aspiration, on Unasyn   4.  DM II             - per primary   5.  Dispo: admitted  Subjective:    Seen in room.  Not eating and drinking a hole los.  Cr has plateaued, BP are better on midodrine.  Having to get I/O cathed.     Objective:   BP 131/89 (BP Location: Left Arm)   Pulse (!) 111   Temp 97.6 F (36.4 C) (Oral)   Resp 18   Ht '5\' 4"'$  (1.626 m)   Wt 70.8 kg   SpO2 100%   BMI 26.79 kg/m   Physical Exam: GEN: NAD, sleeping, easily arousable HEENT EOMI PERRL NECK no JVD PULM slight inspiratory wheezing CV RRR ABD soft EXT no LE edema NEURO AAO x 3 nonfocal SKIN warm and dry  Labs: BMET Recent Labs  Lab 08/13/22 1740 08/14/22 0518 08/15/22 0024 08/16/22 0023 08/17/22 0027 08/18/22 0021  NA 140 139 139 138 135 134*  K 3.9 4.5 4.2 4.7 4.2 4.2  CL 105 105 104 104 102 102  CO2 22 21* 23 16* 22 25  GLUCOSE 175* 226* 108* 112* 135* 81  BUN 37* 36* 38* 49* 50* 51*  CREATININE 2.42* 2.53* 2.55* 3.04* 3.17* 3.19*  CALCIUM 8.5* 7.9* 8.4* 8.4* 8.1* 8.1*  PHOS  --   --  4.7* 4.9* 4.8* 5.0*   CBC Recent Labs  Lab 08/15/22 0024 08/16/22 0023 08/17/22 0027 08/18/22 0021  WBC 11.8* 8.6 8.0 4.4  NEUTROABS 9.5* 6.2 5.6 2.5  HGB 9.8* 9.3* 8.6* 8.5*  HCT 32.3* 30.6* 27.5* 27.3*  MCV 89.0 90.0 87.0 85.8  PLT 193 165 157 145*      Medications:     amiodarone  400 mg Oral BID   apixaban  2.5 mg Oral BID    FLUoxetine  20 mg Oral Q breakfast   insulin aspart  0-9 Units Subcutaneous TID WC   insulin detemir  12 Units Subcutaneous Daily   levothyroxine  50 mcg Oral QAC breakfast   midodrine  5 mg Oral TID WC   pramipexole  0.5 mg Oral QHS   QUEtiapine  100 mg Oral QHS   sodium chloride flush  3 mL Intravenous Q12H     Madelon Lips, MD 08/18/2022, 8:57 AM

## 2022-08-19 ENCOUNTER — Inpatient Hospital Stay (HOSPITAL_COMMUNITY): Payer: Medicare Other

## 2022-08-19 DIAGNOSIS — I480 Paroxysmal atrial fibrillation: Secondary | ICD-10-CM | POA: Diagnosis not present

## 2022-08-19 LAB — CBC WITH DIFFERENTIAL/PLATELET
Abs Immature Granulocytes: 0.01 10*3/uL (ref 0.00–0.07)
Basophils Absolute: 0 10*3/uL (ref 0.0–0.1)
Basophils Relative: 1 %
Eosinophils Absolute: 0 10*3/uL (ref 0.0–0.5)
Eosinophils Relative: 1 %
HCT: 28.3 % — ABNORMAL LOW (ref 36.0–46.0)
Hemoglobin: 8.6 g/dL — ABNORMAL LOW (ref 12.0–15.0)
Immature Granulocytes: 0 %
Lymphocytes Relative: 8 %
Lymphs Abs: 0.4 10*3/uL — ABNORMAL LOW (ref 0.7–4.0)
MCH: 26.7 pg (ref 26.0–34.0)
MCHC: 30.4 g/dL (ref 30.0–36.0)
MCV: 87.9 fL (ref 80.0–100.0)
Monocytes Absolute: 0.4 10*3/uL (ref 0.1–1.0)
Monocytes Relative: 8 %
Neutro Abs: 4.4 10*3/uL (ref 1.7–7.7)
Neutrophils Relative %: 82 %
Platelets: 170 10*3/uL (ref 150–400)
RBC: 3.22 MIL/uL — ABNORMAL LOW (ref 3.87–5.11)
RDW: 17.5 % — ABNORMAL HIGH (ref 11.5–15.5)
WBC: 5.3 10*3/uL (ref 4.0–10.5)
nRBC: 0 % (ref 0.0–0.2)

## 2022-08-19 LAB — GLUCOSE, CAPILLARY
Glucose-Capillary: 113 mg/dL — ABNORMAL HIGH (ref 70–99)
Glucose-Capillary: 146 mg/dL — ABNORMAL HIGH (ref 70–99)
Glucose-Capillary: 163 mg/dL — ABNORMAL HIGH (ref 70–99)
Glucose-Capillary: 153 mg/dL — ABNORMAL HIGH (ref 70–99)

## 2022-08-19 LAB — COMPREHENSIVE METABOLIC PANEL
ALT: 10 U/L (ref 0–44)
AST: 12 U/L — ABNORMAL LOW (ref 15–41)
Albumin: 2.7 g/dL — ABNORMAL LOW (ref 3.5–5.0)
Alkaline Phosphatase: 47 U/L (ref 38–126)
Anion gap: 6 (ref 5–15)
BUN: 46 mg/dL — ABNORMAL HIGH (ref 8–23)
CO2: 25 mmol/L (ref 22–32)
Calcium: 8.2 mg/dL — ABNORMAL LOW (ref 8.9–10.3)
Chloride: 101 mmol/L (ref 98–111)
Creatinine, Ser: 2.55 mg/dL — ABNORMAL HIGH (ref 0.44–1.00)
GFR, Estimated: 17 mL/min — ABNORMAL LOW (ref >60)
Glucose, Bld: 145 mg/dL — ABNORMAL HIGH (ref 70–99)
Potassium: 4.5 mmol/L (ref 3.5–5.1)
Sodium: 132 mmol/L — ABNORMAL LOW (ref 135–145)
Total Bilirubin: 0.8 mg/dL (ref 0.3–1.2)
Total Protein: 6.1 g/dL — ABNORMAL LOW (ref 6.5–8.1)

## 2022-08-19 LAB — PHOSPHORUS: Phosphorus: 4.6 mg/dL (ref 2.5–4.6)

## 2022-08-19 LAB — RESPIRATORY PANEL BY PCR

## 2022-08-19 LAB — MAGNESIUM: Magnesium: 2.6 mg/dL — ABNORMAL HIGH (ref 1.7–2.4)

## 2022-08-19 LAB — SARS CORONAVIRUS 2 BY RT PCR: SARS Coronavirus 2 by RT PCR: NEGATIVE

## 2022-08-19 LAB — STREP PNEUMONIAE URINARY ANTIGEN: Strep Pneumo Urinary Antigen: NEGATIVE

## 2022-08-19 MED ORDER — FUROSEMIDE 10 MG/ML IJ SOLN
20.0000 mg | Freq: Three times a day (TID) | INTRAMUSCULAR | Status: DC
  Administered 2022-08-19: 20 mg via INTRAVENOUS
  Filled 2022-08-19: qty 2

## 2022-08-19 MED ORDER — FUROSEMIDE 10 MG/ML IJ SOLN: 40.0000 mg | Freq: Once | INTRAMUSCULAR | Status: DC

## 2022-08-19 MED ORDER — MIDODRINE HCL 5 MG PO TABS: 20.0000 mg | ORAL_TABLET | ORAL | Status: DC

## 2022-08-19 MED ORDER — FUROSEMIDE 10 MG/ML IJ SOLN
60.0000 mg | Freq: Once | INTRAMUSCULAR | Status: AC
  Administered 2022-08-19: 60 mg via INTRAVENOUS
  Filled 2022-08-19: qty 6

## 2022-08-19 MED ORDER — CHLORHEXIDINE GLUCONATE CLOTH 2 % EX PADS
6.0000 | MEDICATED_PAD | Freq: Every day | CUTANEOUS | Status: DC
  Administered 2022-08-19 – 2022-08-24 (×6): 6 via TOPICAL

## 2022-08-19 NOTE — Progress Notes (Signed)
Occupational Therapy Treatment Patient Details Name: Melissa [name] MRN: [id number] DOB: 13-Apr-1928 Today's Date: 08/19/2022   History of present illness Melissa [name] is a 88 y.o. female who was admitted with paroxysmal atrial fibrillation with RVR. PMH: HFrEF (EF 20-25% per TTE 04/13/2021), NICM, PAF on Eliquis, CKD stage IIIb, insulin-dependent T2DM, HTN, hypothyroidism, HLD, anxiety, delusional parasitosis, mild cognitive impairment   OT comments  Melissa [name] is making steady progress. She required up to min A throughout session for ADLs and close min G for mobility with RW. She remains limited by generalized weakness and baseline cognitive impairment. She tolerated ~8 minutes of standing at the sink for grooming without sitting rest break or LOB. OT to continue to follow acutely. POC remains appropriate.    Recommendations for follow up therapy are one component of a multi-disciplinary discharge planning process, led by the attending physician.  Recommendations may be updated based on patient status, additional functional criteria and insurance authorization.    Follow Up Recommendations  No OT follow up    Assistance Recommended at Discharge Frequent or constant Supervision/Assistance  Patient can return home with the following  A little help with walking and/or transfers;A lot of help with bathing/dressing/bathroom;Assistance with cooking/housework;Assistance with feeding;Direct supervision/assist for medications management;Direct supervision/assist for financial management;Assist for transportation;Help with stairs or ramp for entrance   Equipment Recommendations  None recommended by OT       Precautions / Restrictions Precautions Precautions: Fall Precaution Comments: per daughter pt with dementia Restrictions Weight Bearing Restrictions: No       Mobility Bed Mobility Overal bed mobility: Needs Assistance Bed Mobility: Supine to Sit     Supine to sit: Min guard     General bed  mobility comments: increased time and cues needed    Transfers Overall transfer level: Needs assistance Equipment used: Rolling walker (2 wheels) Transfers: Sit to/from Stand Sit to Stand: Min guard           General transfer comment: cues for hand placement     Balance Overall balance assessment: Needs assistance Sitting-balance support: Feet supported, No upper extremity supported Sitting balance-Leahy Scale: Fair     Standing balance support: No upper extremity supported, During functional activity Standing balance-Leahy Scale: Poor Standing balance comment: able to stand at the sink unsupported with close min G                           ADL either performed or assessed with clinical judgement   ADL Overall ADL's : Needs assistance/impaired     Grooming: Wash/dry hands;Oral care;Minimal assistance;Standing Grooming Details (indicate cue type and reason): at the sink, assist for unsupported balance and cues for sequencing                 Toilet Transfer: Min guard;Ambulation;Rolling walker (2 wheels) Toilet Transfer Details (indicate cue type and reason): close min G. simulated.         Functional mobility during ADLs: Min guard;Cueing for sequencing;Cueing for safety General ADL Comments: cues needed for cognition    Extremity/Trunk Assessment Upper Extremity Assessment Upper Extremity Assessment: Generalized weakness   Lower Extremity Assessment Lower Extremity Assessment: Defer to PT evaluation        Vision   Vision Assessment?: No apparent visual deficits   Perception Perception Perception: Not tested   Praxis      Cognition Arousal/Alertness: Awake/alert Behavior During Therapy: Flat affect Overall Cognitive Status: History of cognitive impairments - at  baseline                                 General Comments: oriented to herself only, continueously stated "Im dying" throughout session. Needed simple one step  commands for all mobility and ADLs at the sink              General Comments BP reading low upon arrival, re-checked prior to initiating movement 116/50 (69). after activity BP 128/73 (89). SpO2 stable on 3L    Pertinent Vitals/ Pain       Pain Assessment Pain Assessment: Faces Faces Pain Scale: Hurts a little bit Pain Location: abdomen? Pain Descriptors / Indicators: Grimacing Pain Intervention(s): Limited activity within patient's tolerance, Monitored during session  Frequency  Min 2X/week        Progress Toward Goals  OT Goals(current goals can now be found in the care plan section)  Progress towards OT goals: Progressing toward goals  Acute Rehab OT Goals OT Goal Formulation: With patient Time For Goal Achievement: 2022/09/22 Potential to Achieve Goals: Good ADL Goals Pt Will Perform Grooming: with supervision;standing Pt Will Perform Upper Body Dressing: with set-up;sitting Pt Will Perform Lower Body Dressing: with supervision;sit to/from stand Pt Will Transfer to Toilet: with supervision;ambulating;bedside commode Pt Will Perform Toileting - Clothing Manipulation and hygiene: with supervision;sit to/from stand  Plan Discharge plan remains appropriate       AM-PAC OT "6 Clicks" Daily Activity     Outcome Measure   Help from another person eating meals?: A Little Help from another person taking care of personal grooming?: A Lot Help from another person toileting, which includes using toliet, bedpan, or urinal?: A Little Help from another person bathing (including washing, rinsing, drying)?: A Lot Help from another person to put on and taking off regular upper body clothing?: A Little Help from another person to put on and taking off regular lower body clothing?: A Little 6 Click Score: 16    End of Session Equipment Utilized During Treatment: Rolling walker (2 wheels);Gait belt  OT Visit Diagnosis: Unsteadiness on feet (R26.81);Other abnormalities of gait and  mobility (R26.89);Other symptoms and signs involving cognitive function;Muscle weakness (generalized) (M62.81)   Activity Tolerance Patient tolerated treatment well   Patient Left in chair;with call bell/phone within reach;with chair alarm set   Nurse Communication Mobility status        Time: 5409-8119 OT Time Calculation (min): 15 min  Charges: OT General Charges $OT Visit: 1 Visit OT Treatments $Self Care/Home Management : 8-22 mins    Elliot Cousin 08/19/2022, 10:27 AM

## 2022-08-19 NOTE — Progress Notes (Signed)
Called by [facility] to start BIPAP due to SOB, increased WOB. Placed on 12/5 40%.

## 2022-08-19 NOTE — Progress Notes (Signed)
Rounding Note    Patient Name: Melissa [name] Date of Encounter: 08/19/2022  [place] Cardiologist: [name], MD   Subjective   Cr improving after hydration and BP management. Remains delirious compared to reported baseline.   Inpatient Medications    Scheduled Meds:  amiodarone  400 mg Oral BID   apixaban  2.5 mg Oral BID   FLUoxetine  20 mg Oral Q breakfast   insulin aspart  0-9 Units Subcutaneous TID WC   insulin detemir  9 Units Subcutaneous Daily   levothyroxine  50 mcg Oral QAC breakfast   midodrine  5 mg Oral TID WC   polyethylene glycol  17 g Oral BID   pramipexole  0.5 mg Oral QHS   QUEtiapine  100 mg Oral QHS   senna-docusate  1 tablet Oral QHS   sodium chloride flush  3 mL Intravenous Q12H   Continuous Infusions:  ampicillin-sulbactam (UNASYN) IV Stopped (08/18/22 2257)   PRN Meds: acetaminophen **OR** acetaminophen, albuterol, guaiFENesin-dextromethorphan, morphine injection, ondansetron **OR** ondansetron (ZOFRAN) IV   Vital Signs    Vitals:   08/18/22 2330 08/19/22 0300 08/19/22 0428 08/19/22 0743  BP: (!) 91/46 103/64  (!) 112/47  Pulse: 96 (!) 103 91 (!) 105  Resp: 14 17  (!) 26  Temp: (!) 97.5 F (36.4 C) (!) 97.4 F (36.3 C)  97.7 F (36.5 C)  TempSrc: Oral Oral  Oral  SpO2: 99% 99% 100% 100%  Weight:   73.8 kg   Height:        Intake/Output Summary (Last 24 hours) at 08/19/2022 0838 Last data filed at 08/19/2022 0745 Gross per 24 hour  Intake 650.06 ml  Output 625 ml  Net 25.06 ml      08/19/2022    4:28 AM 08/18/2022    5:00 AM 08/17/2022    9:42 AM  Last 3 Weights  Weight (lbs) 162 lb 11.2 oz 156 lb 1.4 oz 154 lb 4.8 oz  Weight (kg) 73.8 kg 70.8 kg 69.99 kg      Telemetry    Afib 90-100s - Personally Reviewed  ECG    Afib - Personally Reviewed  Physical Exam   GEN: No acute distress, sitting up in chair by window Neck: No JVD Cardiac: irregular and tachycardic Respiratory: shallow inspirations, grossly  clear GI: Soft, nontender, non-distended  MS: No edema; No deformity. Neuro:  Nonfocal  Psych: Normal affect, not oriented  Labs    High Sensitivity Troponin:   Recent Labs  Lab 08/13/22 1957 08/16/22 1535 08/16/22 1806  TROPONINIHS '12 14 13     '$ Chemistry Recent Labs  Lab 08/17/22 0027 08/18/22 0021 08/19/22 0017  NA 135 134* 132*  K 4.2 4.2 4.5  CL 102 102 101  CO2 '22 25 25  '$ GLUCOSE 135* 81 145*  BUN 50* 51* 46*  CREATININE 3.17* 3.19* 2.55*  CALCIUM 8.1* 8.1* 8.2*  MG 2.7* 2.8* 2.6*  PROT 5.4* 5.4* 6.1*  ALBUMIN 2.6* 2.5* 2.7*  AST 10* 9* 12*  ALT '10 10 10  '$ ALKPHOS 45 49 47  BILITOT 0.5 0.4 0.8  GFRNONAA 13* 13* 17*  ANIONGAP '11 7 6    '$ Lipids No results for input(s): "CHOL", "TRIG", "HDL", "LABVLDL", "LDLCALC", "CHOLHDL" in the last 168 hours.  Hematology Recent Labs  Lab 08/17/22 0027 08/18/22 0021 08/19/22 0017  WBC 8.0 4.4 5.3  RBC 3.16* 3.18* 3.22*  HGB 8.6* 8.5* 8.6*  HCT 27.5* 27.3* 28.3*  MCV 87.0 85.8 87.9  MCH 27.2 26.7 26.7  MCHC 31.3 31.1 30.4  RDW 17.6* 17.4* 17.5*  PLT 157 145* 170   Thyroid  Recent Labs  Lab 08/16/22 0023  TSH 3.352    BNPNo results for input(s): "BNP", "PROBNP" in the last 168 hours.  DDimer No results for input(s): "DDIMER" in the last 168 hours.   Radiology    DG CHEST PORT 1 VIEW  Result Date: 08/18/2022 CLINICAL DATA:  Shortness of breath. EXAM: PORTABLE CHEST 1 VIEW COMPARISON:  Radiograph 08/16/2022 FINDINGS: Stable upper normal heart size. Unchanged mediastinal contours. Aortic atherosclerosis. Similar appearance of basilar predominant airspace opacities without significant interval change. Possible small left pleural effusion. No pneumothorax. IMPRESSION: Unchanged basilar predominant airspace opacities, suspicious for atypical pneumonia. Possible small left pleural effusion. Electronically Signed   By: Keith Rake M.D.   On: 08/18/2022 17:44    Cardiac Studies   Echocardiogram 08/14/22 1. Left  ventricular ejection fraction, by estimation, is 25%. The left  ventricle has severely decreased function. The left ventricle demonstrates  global hypokinesis. Left ventricular diastolic parameters are  indeterminate.   2. Right ventricular systolic function is moderately reduced. The right  ventricular size is normal. There is normal pulmonary artery systolic  pressure. The estimated right ventricular systolic pressure is 96.2 mmHg.   3. Left atrial size was moderately dilated.   4. The mitral valve is abnormal with restricted posterior leaflet.  Moderate mitral valve regurgitation. No evidence of mitral stenosis.   5. Tricuspid valve regurgitation is mild to moderate.   6. The aortic valve is tricuspid. Aortic valve regurgitation is not  visualized. No aortic stenosis is present.   7. The inferior vena cava is normal in size with <50% respiratory  variability, suggesting right atrial pressure of 8 mmHg.   8. The patient was in atrial fibrillation.   Patient Profile     88 y.o. female with a hx of atrial fib followed by [name], last seen 08/2021 also EF 20-25% NICM, delusional disorder, DM-2, GERD now admitted with sepsis, cardiology consulted for management of paroxysmal afib. Had brief sinus rhythm in hospital but back in afib with RVR and hypotension. Note patient was admitted in 05/2021 with pneumonia, found to have A-fib with RVR.  She was started on Eliquis 2.5 twice daily, underwent cardioversion in 05/2021.  More recently admitted in 06/2022 with pneumonia, maintained normal sinus rhythm that admission. Now presented with shortness of breath. Found to be in afib with RVR  Assessment & Plan    Paroxysmal Atrial Fibrillation  - Treated with IV amiodarone and converted to NSR and then had recurrence of Afib.  - Now on PO amiodarone 400 mg Bid for better loading, seems to be helping. - consider IV amio 150 mg bolus administered slowly if further rate control needed. - Home metoprolol  held due to soft BP while in Afib. BP is better with better rate control and addition of midodrine per neph. - Consider adding low dose metoprolol for rate control as needed. Infection workup being pursued today for mental status. - Continue Eliquis 2.5 mg twice daily (dose reduced due to age, renal function) - Echo this admission with EF 25%, moderately reduced RV function, moderately dilated LA, moderate mitral valve regurgitation  - Maintain K>4, mag>2    Chronic systolic heart failure - Most recent echocardiogram completed 04/2021 showed EF 20-25%, global hypokinesis, normal RV systolic function - Echo this admission with EF 25%, moderately reduced RV systolic function, moderate mitral valve regurgitation (  restricted posterior leaflet)  -Home metoprolol held due to soft BP.  Not on ACE/ARB/ARNI or Arlyce Harman given CKD - Home Lasix held given AKI on CKD stage IIIb, neph on board   Hypotension - BP soft this admission  - Holding home BP medications  - Patient has received IV fluids - Suspect BP will improve with HR control, has improved with midodrine. - midodrine added by nephrology.  - no current cardiogenic shock, has not required inotropes.   AKI on CKD stage IIIb - baseline approximately 1.3 -Creatinine further increased to 3, improved today with hydration 2.55. - Holding home BP medications to avoid hypotension. - possible poor PO intake is contributing.    For questions or updates, please contact [place] Please consult [email or web address] for contact info under        Signed, Elouise Munroe, MD  08/19/2022, 8:38 AM

## 2022-08-19 NOTE — Progress Notes (Signed)
Physical Therapy Treatment Patient Details Name: Melissa [name] MRN: [id number] DOB: 1928/08/30 Today's Date: 08/19/2022   History of Present Illness Melissa [name] is a 88 y.o. female who was admitted with paroxysmal atrial fibrillation with RVR. PMH: HFrEF (EF 20-25% per TTE 04/13/2021), NICM, PAF on Eliquis, CKD stage IIIb, insulin-dependent T2DM, HTN, hypothyroidism, HLD, anxiety, delusional parasitosis, mild cognitive impairment    PT Comments    Pt progressing slowly towards her physical therapy goals. Received up in chair and agreeable to participate. Pt reporting abdominal pain and feeling "bloated." Pt ambulating 30 ft with a walker at a min assist level. Further distance limited due to fatigue. HR 112-120, BP 124/62 (82) post mobility. Will continue to progress as tolerated.    Recommendations for follow up therapy are one component of a multi-disciplinary discharge planning process, led by the attending physician.  Recommendations may be updated based on patient status, additional functional criteria and insurance authorization.  Follow Up Recommendations  Home health PT     Assistance Recommended at Discharge Frequent or constant Supervision/Assistance  Patient can return home with the following A little help with walking and/or transfers;A little help with bathing/dressing/bathroom;Direct supervision/assist for medications management;Assist for transportation;Help with stairs or ramp for entrance   Equipment Recommendations  None recommended by PT (has needed DME)    Recommendations for Other Services       Precautions / Restrictions Precautions Precautions: Fall Precaution Comments: on 3L O2 baseline Restrictions Weight Bearing Restrictions: No     Mobility  Bed Mobility               General bed mobility comments: OOB sitting in chair    Transfers Overall transfer level: Needs assistance Equipment used: Rolling walker (2 wheels) Transfers: Sit to/from  Stand Sit to Stand: Min guard           General transfer comment: cues for hand placement    Ambulation/Gait Ambulation/Gait assistance: Min assist Gait Distance (Feet): 30 Feet Assistive device: Rolling walker (2 wheels) Gait Pattern/deviations: Step-through pattern, Decreased stride length, Trunk flexed Gait velocity: decreased Gait velocity interpretation: <1.8 ft/sec, indicate of risk for recurrent falls   General Gait Details: Light minA for balance and intermittent assist for steering RW, fatigues easily   Stairs             Wheelchair Mobility    Modified Rankin (Stroke Patients Only)       Balance Overall balance assessment: Needs assistance Sitting-balance support: Feet supported, No upper extremity supported Sitting balance-Leahy Scale: Fair     Standing balance support: No upper extremity supported, During functional activity Standing balance-Leahy Scale: Poor                              Cognition Arousal/Alertness: Awake/alert Behavior During Therapy: Flat affect Overall Cognitive Status: History of cognitive impairments - at baseline                                 General Comments: oriented to herself only        Exercises      General Comments General comments (skin integrity, edema, etc.): BP reading low upon arrival, re-checked prior to initiating movement 116/50 (69). after activity BP 128/73 (89). SpO2 stable on 3L      Pertinent Vitals/Pain Pain Assessment Pain Assessment: Faces Faces Pain Scale: Hurts a little bit  Pain Location: abdomen Pain Descriptors / Indicators: Grimacing Pain Intervention(s): Monitored during session    Home Living                          Prior Function            PT Goals (current goals can now be found in the care plan section) Acute Rehab PT Goals Potential to Achieve Goals: Fair    Frequency    Min 3X/week      PT Plan Current plan remains  appropriate    Co-evaluation              AM-PAC PT "6 Clicks" Mobility   Outcome Measure  Help needed turning from your back to your side while in a flat bed without using bedrails?: A Little Help needed moving from lying on your back to sitting on the side of a flat bed without using bedrails?: A Little Help needed moving to and from a bed to a chair (including a wheelchair)?: A Little Help needed standing up from a chair using your arms (e.g., wheelchair or bedside chair)?: A Little Help needed to walk in hospital room?: A Little Help needed climbing 3-5 steps with a railing? : A Lot 6 Click Score: 17    End of Session Equipment Utilized During Treatment: Gait belt Activity Tolerance: Patient limited by fatigue Patient left: in chair;with call bell/phone within reach;with chair alarm set Nurse Communication: Mobility status PT Visit Diagnosis: Unsteadiness on feet (R26.81);Muscle weakness (generalized) (M62.81);Difficulty in walking, not elsewhere classified (R26.2)     Time: 1000-1023 PT Time Calculation (min) (ACUTE ONLY): 23 min  Charges:  $Therapeutic Activity: 23-37 mins                     Wyona Almas, PT, DPT Acute Rehabilitation Services Office [phone]    Deno Etienne 08/19/2022, 12:25 PM

## 2022-08-19 NOTE — Progress Notes (Signed)
PROGRESS NOTE    [name]  OAC:[id number] DOB: [birth year] DOA: 08/13/2022 PCP: Darreld Mclean, MD  Chief Complaint  Patient presents with   Code Sepsis    Brief Narrative:  Melissa [name] is Melissa [name] 88 y.o. female with medical history significant for HFrEF (EF 20-25% per TTE 04/13/2021), NICM, PAF on Eliquis, CKD stage IIIb, insulin-dependent T2DM, HTN, hypothyroidism, HLD, anxiety, delusional parasitosis, mild cognitive impairment who is admitted with paroxysmal atrial fibrillation with RVR.  Seen by cardiology and started on IV amiodarone drip.    Assessment & Plan:   Principal Problem:   Paroxysmal atrial fibrillation with rapid ventricular response (HCC) Active Problems:   Acute renal failure superimposed on stage 3b chronic kidney disease (HCC)   Hypotension   Chronic HFrEF (heart failure with reduced ejection fraction) (HCC)   Type 2 diabetes mellitus with diabetic neuropathy, with long-term current use of insulin (HCC)   Hypothyroidism, acquired   PAF (paroxysmal atrial fibrillation) (HCC)   Acute on chronic combined systolic and diastolic CHF (congestive heart failure) (HCC)   Assessment and Plan: Acute on Chronic Hypoxic Respiratory failure  - on chronic supplemental O2 (3 L [place]), will wean as able - CXR with findings concerning for atypical pneumonia - RVP negative, negative covid/flu.  Pending legionella and urine strep. - s/p 5 days unasyn for possible aspiration.  Paroxysmal atrial fibrillation with rapid ventricular response (HCC) Presenting with Ka Flammer-fib with RVR, rate 140s.  Blood pressure soft.  Cardiology consulted and started IV amiodarone drip.  Tachycardia, tachypnea, hypotension all likely driven by Bertha Lokken-fib with RVR.  No sign of active infection.  Sepsis not ruled in. -continued RVR today -Cardiology following, appreciate assistance - transitioned to PO amiodarone, consider metop if BP's stable - consider IV amio bolus if further rate control needed -Update  echocardiogram as below -Keep on telemetry -Hold Toprol-XL with soft blood pressure -Continue Eliquis 2.5 mg twice daily   Hypotension Suspect due to [name]-fib with RVR, hypovolemia?  Given 1 L NS in the ED.  SBP improved at this time.  Possible contribution from infection with pneumonia.   -abx with unasyn as below -Follow blood cultures -BP's improved with midodrine, follow  Delirium Suspect this is all due to the acute hospitalization Delirium precautions W/u additionally prn Continue seroquel  Acute renal failure superimposed on stage 3b chronic kidney disease ([place])  Acute Urinary Retention Creatinine 2.42 on admit compared to baseline around 1.3.  Suspected prerenal from hypoperfusion in setting of Richard Holz-fib with RVR, hypovolemia, hypotension? -creatinine improving -BP is still intermittently soft (improved on midodrine), weights rising -UA with negative protein, 0-5 RBC's -> fena suggestive of intrinsic dz (urine urea nitrogen for FeUrea was canceled)  -Renal ultrasound with chronic medical renal disease -s/p indwelling foley catheter placed overnight 11/5-6.  Consider trial of void in [name] few days. -appreciate renal assistance   Right Sided Pain (shoulder, arm, ear, neck) - troponin, lactic wnl - LFT's unremarkable, has had prior cholecystectomy - low dose morphine, consider additional w/u if doesn't improve with conservative measures  Concern for Aspiration  Abnormal CXR  Wheezing - repeat CXR basilar predominant opacities, unchanged small scattered bilateral nodular opacities - SLP eval -> mild aspiration risk, dysphagia 3/thin liquid diet recommended - albuterol ordered for wheezing, flutter valve  Chronic HFrEF (heart failure with reduced ejection fraction) (HCC) EF 20-25% by last TTE 04/13/2021.  Appears euvolemic but may have some mild pulm edema on CXR.  Received 1 L NS in the ED for  hypotension.  Has AKI on CKD stage IIIb. -Obtain echocardiogram -> EF 25%, global  hypokinesis, moderately reduced RVSF (see report) -Hold Lasix for now, monitor strict I/O's and daily weights -cautious fluids with AKI given above - bolus per renal -Hold Toprol-XL given hypotension -Not currently on ACE/ARB/Spiro; would not add given AKI -Cardiology following - strict I/O, daily weights (requested standing weight, daughter notes weights at home ~140 lbs, consistently gaining weight over the last few days)   Type 2 diabetes mellitus with diabetic neuropathy, with long-term current use of insulin ([name]) Placed on SSI.  Reduced dose basal ordered - will reduce given BG in 70's yesterday PM. A1c 8/5 (05/2022) BG's appropriate   Hypothyroidism, acquired Continue home dose Synthroid 50 mcg daily.  Check TSH (3.5 wnl).   Hypomagnesemia Replace and follow  Anemia Suspect component of dilution, trend Anemia labs    DVT prophylaxis: eliquis Code Status: full Family Communication: none at bedside - called daughter 11/5 Disposition:   Status is: Inpatient Remains inpatient appropriate because: continued need for inpatient management   Consultants:  cardiology  Procedures:  Echo IMPRESSIONS     1. Left ventricular ejection fraction, by estimation, is 25%. The left  ventricle has severely decreased function. The left ventricle demonstrates  global hypokinesis. Left ventricular diastolic parameters are  indeterminate.   2. Right ventricular systolic function is moderately reduced. The right  ventricular size is normal. There is normal pulmonary artery systolic  pressure. The estimated right ventricular systolic pressure is 78.2 mmHg.   3. Left atrial size was moderately dilated.   4. The mitral valve is abnormal with restricted posterior leaflet.  Moderate mitral valve regurgitation. No evidence of mitral stenosis.   5. Tricuspid valve regurgitation is mild to moderate.   6. The aortic valve is tricuspid. Aortic valve regurgitation is not  visualized. No aortic  stenosis is present.   7. The inferior vena cava is normal in size with <50% respiratory  variability, suggesting right atrial pressure of 8 mmHg.   8. The patient was in atrial fibrillation.    Antimicrobials:  Anti-infectives (From admission, onward)    Start     Dose/Rate Route Frequency Ordered Stop   08/14/22 1915  ampicillin-sulbactam (UNASYN) 1.5 g in sodium chloride 0.9 % 100 mL IVPB        1.5 g 200 mL/hr over 30 Minutes Intravenous Every 12 hours 08/14/22 1817 08/19/22 2159   08/13/22 1923  vancomycin variable dose per unstable renal function (pharmacist dosing)  Status:  Discontinued         Does not apply See admin instructions 08/13/22 1924 08/13/22 2105   08/13/22 1830  vancomycin (VANCOREADY) IVPB 1250 mg/250 mL        1,250 mg 166.7 mL/hr over 90 Minutes Intravenous  Once 08/13/22 1828 08/13/22 2040   08/13/22 1815  ceFEPIme (MAXIPIME) 2 g in sodium chloride 0.9 % 100 mL IVPB        2 g 200 mL/hr over 30 Minutes Intravenous  Once 08/13/22 1804 08/13/22 1844       Subjective: Had trouble sleeping last night  Objective: Vitals:   08/18/22 2330 08/19/22 0300 08/19/22 0428 08/19/22 0743  BP: (!) 91/46 103/64  (!) 112/47  Pulse: 96 (!) 103 91 (!) 105  Resp: 14 17  (!) 26  Temp: (!) 97.5 F (36.4 C) (!) 97.4 F (36.3 C)  97.7 F (36.5 C)  TempSrc: Oral Oral  Oral  SpO2: 99% 99% 100% 100%  Weight:  73.8 kg   Height:        Intake/Output Summary (Last 24 hours) at 08/19/2022 0852 Last data filed at 08/19/2022 0745 Gross per 24 hour  Intake 650.06 ml  Output 625 ml  Net 25.06 ml   Filed Weights   08/17/22 0942 08/18/22 0500 08/19/22 0428  Weight: 70 kg 70.8 kg 73.8 kg    Examination:  General: No acute distress. Cardiovascular: irregularly irregular, tachy Lungs: unlabored Abdomen: Soft, nontender, nondistended  Neurological: Alert and oriented 3. Moves all extremities 4 with equal strength. Cranial nerves II through XII grossly  intact. Extremities: No clubbing or cyanosis. No edema.   Data Reviewed: I have personally reviewed following labs and imaging studies  CBC: Recent Labs  Lab 08/15/22 0024 08/16/22 0023 08/17/22 0027 08/18/22 0021 08/19/22 0017  WBC 11.8* 8.6 8.0 4.4 5.3  NEUTROABS 9.5* 6.2 5.6 2.5 4.4  HGB 9.8* 9.3* 8.6* 8.5* 8.6*  HCT 32.3* 30.6* 27.5* 27.3* 28.3*  MCV 89.0 90.0 87.0 85.8 87.9  PLT 193 165 157 145* 814    Basic Metabolic Panel: Recent Labs  Lab 08/15/22 0024 08/16/22 0023 08/17/22 0027 08/18/22 0021 08/19/22 0017  NA 139 138 135 134* 132*  K 4.2 4.7 4.2 4.2 4.5  CL 104 104 102 102 101  CO2 23 16* '22 25 25  '$ GLUCOSE 108* 112* 135* 81 145*  BUN 38* 49* 50* 51* 46*  CREATININE 2.55* 3.04* 3.17* 3.19* 2.55*  CALCIUM 8.4* 8.4* 8.1* 8.1* 8.2*  MG 1.3* 2.9* 2.7* 2.8* 2.6*  PHOS 4.7* 4.9* 4.8* 5.0* 4.6    GFR: Estimated Creatinine Clearance: 13.8 mL/min (Keante Urizar) (by C-G formula based on SCr of 2.55 mg/dL (H)).  Liver Function Tests: Recent Labs  Lab 08/15/22 0024 08/16/22 0023 08/17/22 0027 08/18/22 0021 08/19/22 0017  AST 12* 15 10* 9* 12*  ALT '11 10 10 10 10  '$ ALKPHOS 61 55 45 49 47  BILITOT 0.5 0.4 0.5 0.4 0.8  PROT 6.5 6.1* 5.4* 5.4* 6.1*  ALBUMIN 3.1* 2.9* 2.6* 2.5* 2.7*    CBG: Recent Labs  Lab 08/18/22 0616 08/18/22 1107 08/18/22 1515 08/18/22 2144 08/19/22 0617  GLUCAP 110* 167* 150* 168* 113*     Recent Results (from the past 240 hour(s))  Culture, blood (Routine x 2)     Status: None   Collection Time: 08/13/22  5:40 PM   Specimen: BLOOD  Result Value Ref Range Status   Specimen Description BLOOD RIGHT ANTECUBITAL  Final   Special Requests   Final    BOTTLES DRAWN AEROBIC AND ANAEROBIC Blood Culture adequate volume   Culture   Final    NO GROWTH 5 DAYS Performed at [facility], [place] [address] [place], [place] [address]    Report Status 08/18/2022 FINAL  Final  Culture, blood (Routine x 2)     Status: None   Collection Time:  08/13/22  5:50 PM   Specimen: BLOOD RIGHT HAND  Result Value Ref Range Status   Specimen Description BLOOD RIGHT HAND  Final   Special Requests   Final    BOTTLES DRAWN AEROBIC AND ANAEROBIC Blood Culture results may not be optimal due to an inadequate volume of blood received in culture bottles   Culture   Final    NO GROWTH 5 DAYS Performed at [facility], [place] [address] [place], [place] [address]    Report Status 08/18/2022 FINAL  Final  MRSA Next Gen by PCR, Nasal  Status: None   Collection Time: 08/13/22  6:29 PM   Specimen: Nasal Mucosa; Nasal Swab  Result Value Ref Range Status   MRSA by PCR Next Gen NOT DETECTED NOT DETECTED Final    Comment: (NOTE) The GeneXpert MRSA Assay (FDA approved for NASAL specimens only), is one component of Kathalene Sporer comprehensive MRSA colonization surveillance program. It is not intended to diagnose MRSA infection nor to guide or monitor treatment for MRSA infections. Test performance is not FDA approved in patients less than 63 years old. Performed at [facility], [place] [address], [place], [place] [address]          Radiology Studies: DG CHEST PORT 1 VIEW  Result Date: 08/18/2022 CLINICAL DATA:  Shortness of breath. EXAM: PORTABLE CHEST 1 VIEW COMPARISON:  Radiograph 08/16/2022 FINDINGS: Stable upper normal heart size. Unchanged mediastinal contours. Aortic atherosclerosis. Similar appearance of basilar predominant airspace opacities without significant interval change. Possible small left pleural effusion. No pneumothorax. IMPRESSION: Unchanged basilar predominant airspace opacities, suspicious for atypical pneumonia. Possible small left pleural effusion. Electronically Signed   By: Keith Rake M.D.   On: 08/18/2022 17:44        Scheduled Meds:  amiodarone  400 mg Oral BID   apixaban  2.5 mg Oral BID   FLUoxetine  20 mg Oral Q breakfast   insulin aspart  0-9 Units Subcutaneous TID WC   insulin detemir  9 Units  Subcutaneous Daily   levothyroxine  50 mcg Oral QAC breakfast   midodrine  5 mg Oral TID WC   polyethylene glycol  17 g Oral BID   pramipexole  0.5 mg Oral QHS   QUEtiapine  100 mg Oral QHS   senna-docusate  1 tablet Oral QHS   sodium chloride flush  3 mL Intravenous Q12H   Continuous Infusions:  ampicillin-sulbactam (UNASYN) IV Stopped (08/18/22 2257)     LOS: 6 days    Time spent: over 30 min    Fayrene Helper, MD [facility]   To contact the attending provider between 7A-7P or the covering provider during after hours 7P-7A, please log into the web site [email or web address] and access using universal [place] password for that web site. If you do not have the password, please call the hospital operator.  08/19/2022, 8:52 AM

## 2022-08-19 NOTE — Progress Notes (Signed)
RN notified MD of change in patient respiratory pattern. Patient more labored than before, seems air hungry, RR 24-28 labored, lung sounds crackles to mid collar bone.   1620 MD placed order for IV lasix 60 mg.

## 2022-08-19 NOTE — Progress Notes (Signed)
[facility] Progress Note   Assessment/ Plan:    AKI on CKD 3b: likely in the setting of hypoperfusion, Afib with RVR, sepsis, ongoing hypotension             - appears that Cr has plateaued and now improved today. Main concern now is whether pt has enough intake to maintain good UOP             - PO intake continues to be poor but I'd like to avoid another bolus as she's already edematous w/ rales.  - renal US- no hydro 08/13/22             - persistent hypotension in the setting of RV and LV dysfunction- added midodrine 5 mg TID, BP are better   2.  Afib with RVR:             - controlled now,  on amio and Eliquis   3.  Sepsis:             - likely d/t aspiration, on Unasyn   4.  DM II             - per primary   5.  Dispo: admitted  Subjective:    Seen in room.  Not eating and drinking a hole los.  Cr has plateaued, BP are better on midodrine.  Having to get I/O cathed.     Objective:   BP (!) 117/59 (BP Location: Left Arm)   Pulse (!) 114   Temp (!) 97.4 F (36.3 C) (Oral)   Resp 17   Ht '5\' 4"'$  (1.626 m)   Wt 73.8 kg   SpO2 100%   BMI 27.93 kg/m   Physical Exam: GEN: NAD, sitting in chair and cooperative HEENT EOMI NECK no JVD PULM rales at the bases CV RRR ABD soft EXT tr LE edema NEURO AAO x 3 nonfocal SKIN warm and dry  Labs: BMET Recent Labs  Lab 08/13/22 1740 08/14/22 0518 08/15/22 0024 08/16/22 0023 08/17/22 0027 08/18/22 0021 08/19/22 0017  NA 140 139 139 138 135 134* 132*  K 3.9 4.5 4.2 4.7 4.2 4.2 4.5  CL 105 105 104 104 102 102 101  CO2 22 21* 23 16* '22 25 25  '$ GLUCOSE 175* 226* 108* 112* 135* 81 145*  BUN 37* 36* 38* 49* 50* 51* 46*  CREATININE 2.42* 2.53* 2.55* 3.04* 3.17* 3.19* 2.55*  CALCIUM 8.5* 7.9* 8.4* 8.4* 8.1* 8.1* 8.2*  PHOS  --   --  4.7* 4.9* 4.8* 5.0* 4.6   CBC Recent Labs  Lab 08/16/22 0023 08/17/22 0027 08/18/22 0021 08/19/22 0017  WBC 8.6 8.0 4.4 5.3  NEUTROABS 6.2 5.6 2.5 4.4  HGB 9.3* 8.6* 8.5*  8.6*  HCT 30.6* 27.5* 27.3* 28.3*  MCV 90.0 87.0 85.8 87.9  PLT 165 157 145* 170      Medications:     amiodarone  400 mg Oral BID   apixaban  2.5 mg Oral BID   Chlorhexidine Gluconate Cloth  6 each Topical Q0600   FLUoxetine  20 mg Oral Q breakfast   insulin aspart  0-9 Units Subcutaneous TID WC   insulin detemir  9 Units Subcutaneous Daily   levothyroxine  50 mcg Oral QAC breakfast   midodrine  5 mg Oral TID WC   polyethylene glycol  17 g Oral BID   pramipexole  0.5 mg Oral QHS   QUEtiapine  100 mg Oral QHS   senna-docusate  1  tablet Oral QHS   sodium chloride flush  3 mL Intravenous Q12H    08/19/2022, 11:41 AM

## 2022-08-19 NOTE — Progress Notes (Signed)
Follow up assessment, pt currently off BIPAP on 3L . Resting comfortably. No distress noted. BIPAP on standy by if needed.

## 2022-08-19 NOTE — Progress Notes (Signed)
   08/19/22 1946  Assess: MEWS Score  BP 103/70  MAP (mmHg) 76  Pulse Rate (!) 118  ECG Heart Rate (!) 113  Resp 20  Level of Consciousness Alert  SpO2 100 %  O2 Device Bi-PAP  FiO2 (%) 40 %  Assess: MEWS Score  MEWS Temp 0  MEWS Systolic 0  MEWS Pulse 2  MEWS RR 0  MEWS LOC 0  MEWS Score 2  MEWS Score Color Yellow  Assess: if the MEWS score is Yellow or Red  Were vital signs taken at a resting state? No  Focused Assessment Change from prior assessment (see assessment flowsheet)  Does the patient meet 2 or more of the SIRS criteria? Yes  Does the patient have a confirmed or suspected source of infection? Yes  Provider and Rapid Response Notified? Yes  MEWS guidelines implemented *See Row Information* Yes  Treat  MEWS Interventions Escalated (See documentation below);Consulted Respiratory Therapy  Pain Scale 0-10  Pain Score 0  Neuro symptoms relieved by Rest  Escalate  MEWS: Escalate Yellow: discuss with charge nurse/RN and consider discussing with provider and RRT  Notify: Charge Nurse/RN  Name of Charge Nurse/RN Notified Clint Lipps, RN  Date Charge Nurse/RN Notified 08/19/22  Time Charge Nurse/RN Notified 1946  Notify: Provider  Provider Name/Title Dr. Nevada Crane  Date Provider Notified 08/19/22  Time Provider Notified 1943  Method of Notification Page  Notification Reason Change in status  Provider response See new orders;At bedside  Date of Provider Response 08/19/22  Time of Provider Response 1943  Notify: Rapid Response  Name of Rapid Response RN Notified Irma Newness, RN  Date Rapid Response Notified 08/19/22  Time Rapid Response Notified  (at bedside)  Document  Patient Outcome Stabilized after interventions  Progress note created (see row info) Yes  Assess: SIRS CRITERIA  SIRS Temperature  0  SIRS Pulse 1  SIRS Respirations  0  SIRS WBC 1  SIRS Score Sum  2

## 2022-08-19 NOTE — Significant Event (Addendum)
Rapid Response Event Note   Reason for Call : Respiratory distress Initial Focused Assessment:  On rounding, pt found in fowlers position, with NRB mask pulled off, sats 70% and in distress with accessory muscle use and diaphoretic. NRB mask replaced at 15L, Albuterol neb given. Sats now 100%. Pt impulsive, anxious, answering questions. Denies CP but endorses SOB. Pt placed on BIPAP due to increased WOB with accessory muscle use.  Pt anxious and tachypneic earlier today and received Lasix 60 mg with minimal response.   1946-97.37F, HR 118 afib, 103/70, RR 24 on BIPAP 12/5 and 40% Fio2    Interventions:  -Albuterol neb -Bipap    MD Notified:  Dr. Nevada Crane notified. Awaiting response.  Call Time: 1918 Arrival Time: 1918 End Time: 1950  Madelynn Done, RN

## 2022-08-20 DIAGNOSIS — I480 Paroxysmal atrial fibrillation: Secondary | ICD-10-CM | POA: Diagnosis not present

## 2022-08-20 LAB — COMPREHENSIVE METABOLIC PANEL
ALT: 13 U/L (ref 0–44)
AST: 11 U/L — ABNORMAL LOW (ref 15–41)
Albumin: 3 g/dL — ABNORMAL LOW (ref 3.5–5.0)
Alkaline Phosphatase: 50 U/L (ref 38–126)
Anion gap: 9 (ref 5–15)
BUN: 42 mg/dL — ABNORMAL HIGH (ref 8–23)
CO2: 27 mmol/L (ref 22–32)
Calcium: 8.5 mg/dL — ABNORMAL LOW (ref 8.9–10.3)
Chloride: 99 mmol/L (ref 98–111)
Creatinine, Ser: 2.05 mg/dL — ABNORMAL HIGH (ref 0.44–1.00)
GFR, Estimated: 22 mL/min — ABNORMAL LOW (ref >60)
Glucose, Bld: 136 mg/dL — ABNORMAL HIGH (ref 70–99)
Potassium: 4.3 mmol/L (ref 3.5–5.1)
Sodium: 135 mmol/L (ref 135–145)
Total Bilirubin: 0.4 mg/dL (ref 0.3–1.2)
Total Protein: 6.4 g/dL — ABNORMAL LOW (ref 6.5–8.1)

## 2022-08-20 LAB — CBC WITH DIFFERENTIAL/PLATELET
Abs Immature Granulocytes: 0.05 10*3/uL (ref 0.00–0.07)
Basophils Absolute: 0 10*3/uL (ref 0.0–0.1)
Basophils Relative: 1 %
Eosinophils Absolute: 0.3 10*3/uL (ref 0.0–0.5)
Eosinophils Relative: 4 %
HCT: 30.2 % — ABNORMAL LOW (ref 36.0–46.0)
Hemoglobin: 9.5 g/dL — ABNORMAL LOW (ref 12.0–15.0)
Immature Granulocytes: 1 %
Lymphocytes Relative: 14 %
Lymphs Abs: 0.9 10*3/uL (ref 0.7–4.0)
MCH: 27 pg (ref 26.0–34.0)
MCHC: 31.5 g/dL (ref 30.0–36.0)
MCV: 85.8 fL (ref 80.0–100.0)
Monocytes Absolute: 0.7 10*3/uL (ref 0.1–1.0)
Monocytes Relative: 12 %
Neutro Abs: 4.5 10*3/uL (ref 1.7–7.7)
Neutrophils Relative %: 68 %
Platelets: 180 10*3/uL (ref 150–400)
RBC: 3.52 MIL/uL — ABNORMAL LOW (ref 3.87–5.11)
RDW: 17.5 % — ABNORMAL HIGH (ref 11.5–15.5)
WBC: 6.4 10*3/uL (ref 4.0–10.5)
nRBC: 0 % (ref 0.0–0.2)

## 2022-08-20 LAB — GLUCOSE, CAPILLARY
Glucose-Capillary: 94 mg/dL (ref 70–99)
Glucose-Capillary: 194 mg/dL — ABNORMAL HIGH (ref 70–99)
Glucose-Capillary: 149 mg/dL — ABNORMAL HIGH (ref 70–99)
Glucose-Capillary: 124 mg/dL — ABNORMAL HIGH (ref 70–99)

## 2022-08-20 LAB — BRAIN NATRIURETIC PEPTIDE: B Natriuretic Peptide: 1406.9 pg/mL — ABNORMAL HIGH (ref 0.0–100.0)

## 2022-08-20 MED ORDER — ORAL CARE MOUTH RINSE: 15.0000 mL | OROMUCOSAL | Status: DC | PRN

## 2022-08-20 MED ORDER — MAGNESIUM HYDROXIDE 400 MG/5ML PO SUSP
30.0000 mL | Freq: Once | ORAL | Status: AC
  Administered 2022-08-20: 30 mL via ORAL
  Filled 2022-08-20: qty 30

## 2022-08-20 MED ORDER — CALCIUM CARBONATE ANTACID 500 MG PO CHEW
1.0000 | CHEWABLE_TABLET | Freq: Two times a day (BID) | ORAL | Status: DC
  Administered 2022-08-20 – 2022-08-25 (×9): 200 mg via ORAL
  Filled 2022-08-20 (×12): qty 1

## 2022-08-20 MED ORDER — FUROSEMIDE 10 MG/ML IJ SOLN
80.0000 mg | Freq: Once | INTRAMUSCULAR | Status: AC
  Administered 2022-08-20: 80 mg via INTRAVENOUS
  Filled 2022-08-20: qty 8

## 2022-08-20 NOTE — Plan of Care (Signed)

## 2022-08-20 NOTE — Progress Notes (Signed)
Rounding Note    Patient Name: Melissa [name] Date of Encounter: 08/20/2022  [place] Cardiologist: [name], MD   Subjective   Received lasix yesterday for rales and respiratory distress. Remains delirious.  Inpatient Medications    Scheduled Meds:  amiodarone  400 mg Oral BID   apixaban  2.5 mg Oral BID   Chlorhexidine Gluconate Cloth  6 each Topical Q0600   FLUoxetine  20 mg Oral Q breakfast   furosemide  80 mg Intravenous Once   insulin aspart  0-9 Units Subcutaneous TID WC   insulin detemir  9 Units Subcutaneous Daily   levothyroxine  50 mcg Oral QAC breakfast   midodrine  5 mg Oral TID WC   polyethylene glycol  17 g Oral BID   pramipexole  0.5 mg Oral QHS   QUEtiapine  100 mg Oral QHS   senna-docusate  1 tablet Oral QHS   sodium chloride flush  3 mL Intravenous Q12H   Continuous Infusions:   PRN Meds: acetaminophen **OR** acetaminophen, albuterol, guaiFENesin-dextromethorphan, morphine injection, ondansetron **OR** ondansetron (ZOFRAN) IV, mouth rinse   Vital Signs    Vitals:   08/20/22 0300 08/20/22 0400 08/20/22 0500 08/20/22 0736  BP: (!) 105/51 96/60  116/61  Pulse: (!) 102 100  (!) 113  Resp: '12 15  19  '$ Temp: 97.6 F (36.4 C)   97.6 F (36.4 C)  TempSrc: Oral   Axillary  SpO2: 100% 100%  100%  Weight:   72.9 kg   Height:        Intake/Output Summary (Last 24 hours) at 08/20/2022 0900 Last data filed at 08/20/2022 0346 Gross per 24 hour  Intake 10 ml  Output 1200 ml  Net -1190 ml      08/20/2022    5:00 AM 08/19/2022    4:28 AM 08/18/2022    5:00 AM  Last 3 Weights  Weight (lbs) 160 lb 11.5 oz 162 lb 11.2 oz 156 lb 1.4 oz  Weight (kg) 72.9 kg 73.8 kg 70.8 kg      Telemetry    Afib 90-100s - Personally Reviewed  ECG    Afib - Personally Reviewed  Physical Exam   GEN: No acute distress Neck: No JVD Cardiac: irregular and tachycardic Respiratory: crackles in bases to mid lung.  GI: Soft, nontender, non-distended   MS: trace edema; No deformity. Neuro:  Nonfocal  Psych: Normal affect, not oriented  Labs    High Sensitivity Troponin:   Recent Labs  Lab 08/13/22 1957 08/16/22 1535 08/16/22 1806  TROPONINIHS '12 14 13     '$ Chemistry Recent Labs  Lab 08/17/22 0027 08/18/22 0021 08/19/22 0017 08/20/22 0734  NA 135 134* 132* 135  K 4.2 4.2 4.5 4.3  CL 102 102 101 99  CO2 '22 25 25 27  '$ GLUCOSE 135* 81 145* 136*  BUN 50* 51* 46* 42*  CREATININE 3.17* 3.19* 2.55* 2.05*  CALCIUM 8.1* 8.1* 8.2* 8.5*  MG 2.7* 2.8* 2.6*  --   PROT 5.4* 5.4* 6.1* 6.4*  ALBUMIN 2.6* 2.5* 2.7* 3.0*  AST 10* 9* 12* 11*  ALT '10 10 10 13  '$ ALKPHOS 45 49 47 50  BILITOT 0.5 0.4 0.8 0.4  GFRNONAA 13* 13* 17* 22*  ANIONGAP '11 7 6 9    '$ Lipids No results for input(s): "CHOL", "TRIG", "HDL", "LABVLDL", "LDLCALC", "CHOLHDL" in the last 168 hours.  Hematology Recent Labs  Lab 08/18/22 0021 08/19/22 0017 08/20/22 0734  WBC 4.4 5.3 6.4  RBC 3.18* 3.22* 3.52*  HGB 8.5* 8.6* 9.5*  HCT 27.3* 28.3* 30.2*  MCV 85.8 87.9 85.8  MCH 26.7 26.7 27.0  MCHC 31.1 30.4 31.5  RDW 17.4* 17.5* 17.5*  PLT 145* 170 180   Thyroid  Recent Labs  Lab 08/16/22 0023  TSH 3.352    BNP Recent Labs  Lab 08/19/22 0017  BNP 1,406.9*    DDimer No results for input(s): "DDIMER" in the last 168 hours.   Radiology    DG CHEST PORT 1 VIEW  Result Date: 08/19/2022 CLINICAL DATA:  Shortness of breath. EXAM: PORTABLE CHEST 1 VIEW COMPARISON:  Radiograph yesterday FINDINGS: Increasing multifocal lung opacities, greatest in the lung bases, but also increasing in the suprahilar regions. Stable heart size and mediastinal contours. Suspect developing pleural effusions. No pneumothorax. IMPRESSION: 1. Increasing multifocal lung opacities, greatest in the lung bases, but also increasing in the suprahilar regions. Findings suspicious for worsening multifocal pneumonia. 2. Suspect developing pleural effusions. Electronically Signed   By: Keith Rake M.D.   On: 08/19/2022 17:30   DG CHEST PORT 1 VIEW  Result Date: 08/18/2022 CLINICAL DATA:  Shortness of breath. EXAM: PORTABLE CHEST 1 VIEW COMPARISON:  Radiograph 08/16/2022 FINDINGS: Stable upper normal heart size. Unchanged mediastinal contours. Aortic atherosclerosis. Similar appearance of basilar predominant airspace opacities without significant interval change. Possible small left pleural effusion. No pneumothorax. IMPRESSION: Unchanged basilar predominant airspace opacities, suspicious for atypical pneumonia. Possible small left pleural effusion. Electronically Signed   By: Keith Rake M.D.   On: 08/18/2022 17:44    Cardiac Studies   Echocardiogram 08/14/22 1. Left ventricular ejection fraction, by estimation, is 25%. The left  ventricle has severely decreased function. The left ventricle demonstrates  global hypokinesis. Left ventricular diastolic parameters are  indeterminate.   2. Right ventricular systolic function is moderately reduced. The right  ventricular size is normal. There is normal pulmonary artery systolic  pressure. The estimated right ventricular systolic pressure is 42.6 mmHg.   3. Left atrial size was moderately dilated.   4. The mitral valve is abnormal with restricted posterior leaflet.  Moderate mitral valve regurgitation. No evidence of mitral stenosis.   5. Tricuspid valve regurgitation is mild to moderate.   6. The aortic valve is tricuspid. Aortic valve regurgitation is not  visualized. No aortic stenosis is present.   7. The inferior vena cava is normal in size with <50% respiratory  variability, suggesting right atrial pressure of 8 mmHg.   8. The patient was in atrial fibrillation.   Patient Profile     88 y.o. female with a hx of atrial fib followed by [name], last seen 08/2021 also EF 20-25% NICM, delusional disorder, DM-2, GERD now admitted with sepsis, cardiology consulted for management of paroxysmal afib. Had brief sinus rhythm  in hospital but back in afib with RVR and hypotension. Note patient was admitted in 05/2021 with pneumonia, found to have A-fib with RVR.  She was started on Eliquis 2.5 twice daily, underwent cardioversion in 05/2021.  More recently admitted in 06/2022 with pneumonia, maintained normal sinus rhythm that admission. Now presented with shortness of breath. Found to be in afib with RVR  Assessment & Plan    Paroxysmal Atrial Fibrillation  - Treated with IV amiodarone and converted to NSR and then had recurrence of Afib.  - Now on PO amiodarone 400 mg Bid for better loading, seems to be helping. - consider IV amio 150 mg bolus administered slowly if further rate control  needed. - Home metoprolol held due to soft BP while in Afib. BP is better with better rate control and addition of midodrine per neph. - Consider adding low dose metoprolol for rate control as needed. Infection workup being pursued today for mental status. - Continue Eliquis 2.5 mg twice daily (dose reduced due to age, renal function) - Echo this admission with EF 25%, moderately reduced RV function, moderately dilated LA, moderate mitral valve regurgitation  - Maintain K>4, mag>2    Chronic systolic heart failure - Most recent echocardiogram completed 04/2021 showed EF 20-25%, global hypokinesis, normal RV systolic function - Echo this admission with EF 25%, moderately reduced RV systolic function, moderate mitral valve regurgitation (restricted posterior leaflet)  -Home metoprolol held due to soft BP.  Not on ACE/ARB/ARNI or Arlyce Harman given CKD - received lasix yesterday with improvement in renal function, breathing and UOP. Agree with redosing today as ordered.   Hypotension - BP soft this admission  - Holding home BP medications  - Patient has received IV fluids, now with volume overload but improvement in renal function.  - Suspect BP will improve with HR control, has improved with midodrine. - midodrine added by nephrology.  Maintaining BP.   AKI on CKD stage IIIb - baseline approximately 1.3 -Creatinine further increased to 3, improved today with hydration and subsequent lasix. - Holding home BP medications to avoid hypotension. - possible poor PO intake is contributing.    For questions or updates, please contact [facility] Please consult [email or web address] for contact info under        Signed, Elouise Munroe, MD  08/20/2022, 9:00 AM

## 2022-08-20 NOTE — Progress Notes (Signed)
Pt still complaining abdominal pain 10/10 and nauseous.  Abdomen is not distended but Hypoactive. Pt is not passing gas. DR hall was made aware. PRN meds given. Continue monitoring.

## 2022-08-20 NOTE — Progress Notes (Signed)
Pt currently remains off bipap at this time. No distress noted. RT will continue to be available as needed.

## 2022-08-20 NOTE — Progress Notes (Addendum)
PROGRESS NOTE    Melissa [name]  MVH:[id number] DOB: June 01, 1928 DOA: 08/13/2022 PCP: Darreld Mclean, MD  Chief Complaint  Patient presents with   Code Sepsis    Brief Narrative:  Melissa [name] is Melissa [name] 88 y.o. female with medical history significant for HFrEF (EF 20-25% per TTE 04/13/2021), NICM, PAF on Eliquis, CKD stage IIIb, insulin-dependent T2DM, HTN, hypothyroidism, HLD, anxiety, delusional parasitosis, mild cognitive impairment who is admitted with paroxysmal atrial fibrillation with RVR.  Seen by cardiology and started on IV amiodarone drip.  She was started on unasyn with concern for aspiration pneumonia as well.  She developed progressive acute kidney injury and renal was consulted.  Creatinine has plateau'd and is improving, but she's developed worsening CXR findings, rising weights suggestive of HF exacerbation.  Required bipap 11/6 as well as diuresis with lasix.    See below for additional details    Assessment & Plan:   Principal Problem:   Paroxysmal atrial fibrillation with rapid ventricular response (HCC) Active Problems:   Acute renal failure superimposed on stage 3b chronic kidney disease (HCC)   Hypotension   Chronic HFrEF (heart failure with reduced ejection fraction) (HCC)   Type 2 diabetes mellitus with diabetic neuropathy, with long-term current use of insulin (HCC)   Hypothyroidism, acquired   PAF (paroxysmal atrial fibrillation) (HCC)   Acute on chronic combined systolic and diastolic CHF (congestive heart failure) (HCC)   Assessment and Plan: Acute on Chronic Hypoxic Respiratory failure  - on chronic supplemental O2 (3 L [place]), will wean as able - CXR with findings concerning for multifocal pna - RVP negative, negative covid/flu.  Pending legionella and urine strep. - s/p 5 days unasyn for possible aspiration.  Required bipap 11/6 for developing resp distress, crackles, got 60 mg lasix with minimal response and was placed on bipap.  Suspect this is due to  volume overload/HF rather than infectious process as patient afebrile and normal WBC count.  Currently improved from resp status, not requiring bipap, but continued crackles on exam.  Will continue diuresis as tolerated as below.  Acute renal failure superimposed on stage 3b chronic kidney disease ([name])  Acute Urinary Retention Creatinine 2.42 on admit compared to baseline around 1.3.  Suspected prerenal from hypoperfusion in setting of Melissa [name]-fib with RVR, hypovolemia, hypotension? -creatinine improving, awaiting results today - expect she'll need additional lasix with volume/CXR findings -BP is still intermittently soft (improved on midodrine), weights higher -UA with negative protein, 0-5 RBC's -> fena suggestive of intrinsic dz (urine urea nitrogen for FeUrea was canceled)  -Renal ultrasound with chronic medical renal disease -s/p indwelling foley catheter placed overnight 11/5-6.  Consider trial of void in Melissa [name] few days. -appreciate renal assistance   Acute on Chronic HFrEF (heart failure with reduced ejection fraction) (HCC) EF 20-25% by last TTE 04/13/2021.  Appears euvolemic but may have some mild pulm edema on CXR.  Received 1 L NS in the ED for hypotension.  Has AKI on CKD stage IIIb. -CXR 11/6 with increasing multifocal lung opacities, greatest in the lung bases, but also increasing in suprahilar regions - concerning for multifocal pneumonia, developing effusions -> suspect developing overload rather than infectious process -Obtain echocardiogram -> EF 25%, global hypokinesis, moderately reduced RVSF (see report) -s/p 60 mg IV lasix yesterday with poor response from resp status, awaiting labs today, would benefit from continued diuresis -Hold Toprol-XL given hypotension -Not currently on ACE/ARB/Spiro; would not add given AKI -Cardiology following - strict I/O, daily weights (requested standing  weight, daughter notes weights at home ~140 lbs, weight notably rising higher here)  Paroxysmal  atrial fibrillation with rapid ventricular response (HCC) Presenting with Melissa [name]-fib with RVR, rate 140s.  Blood pressure soft.  Cardiology consulted and started IV amiodarone drip.  Tachycardia, tachypnea, hypotension all likely driven by Melissa [name]-fib with RVR.  No sign of active infection.  Sepsis not ruled in. -continued RVR today -Cardiology following, appreciate assistance - transitioned to PO amiodarone, consider metop if BP's stable - consider IV amio bolus if further rate control needed -Update echocardiogram as below -Keep on telemetry -Hold Toprol-XL with soft blood pressure -Continue Eliquis 2.5 mg twice daily  Concern for Aspiration  Abnormal CXR  Wheezing - repeat CXR with increasing multifocal lung opacities, greatest in the lung bases, also increasing in suprahilar regions, concerning for worsening multifocal pneumonia, developing effusions - suspects CXR represents volume overload in setting of HF with her rising weights, normal WBC count, lack of fever - SLP eval -> mild aspiration risk, dysphagia 3/thin liquid diet recommended - albuterol ordered for wheezing, flutter valve   Delirium Suspect this is all due to the acute hospitalization, SOB, hypoxic respiratory failure Delirium precautions W/u additionally prn Continue seroquel  Hypotension Suspect due to Melissa [name]-fib with RVR, hypovolemia?  Given 1 L NS in the ED.  SBP improved at this time.  Possible contribution from infection with pneumonia.   -abx with unasyn as below -Follow blood cultures -BP's improved with midodrine, follow  Type 2 diabetes mellitus with diabetic neuropathy, with long-term current use of insulin ([place]) Placed on SSI.  Reduced dose basal ordered - will reduce given BG in 70's yesterday PM. A1c 8/5 (05/2022) BG's appropriate   Hypothyroidism, acquired Continue home dose Synthroid 50 mcg daily.  Check TSH (3.5 wnl).   Hypomagnesemia Replace and follow  Anemia Suspect component of dilution, trend Anemia  labs  Right Sided Pain (shoulder, arm, ear, neck) - troponin, lactic wnl - LFT's unremarkable, has had prior cholecystectomy - improved at this time, strange presentation, unclear cause     DVT prophylaxis: eliquis Code Status: full Family Communication: none at bedside - called daughter 11/7 Disposition:   Status is: Inpatient Remains inpatient appropriate because: continued need for inpatient management   Consultants:  cardiology  Procedures:  Echo IMPRESSIONS     1. Left ventricular ejection fraction, by estimation, is 25%. The left  ventricle has severely decreased function. The left ventricle demonstrates  global hypokinesis. Left ventricular diastolic parameters are  indeterminate.   2. Right ventricular systolic function is moderately reduced. The right  ventricular size is normal. There is normal pulmonary artery systolic  pressure. The estimated right ventricular systolic pressure is 46.5 mmHg.   3. Left atrial size was moderately dilated.   4. The mitral valve is abnormal with restricted posterior leaflet.  Moderate mitral valve regurgitation. No evidence of mitral stenosis.   5. Tricuspid valve regurgitation is mild to moderate.   6. The aortic valve is tricuspid. Aortic valve regurgitation is not  visualized. No aortic stenosis is present.   7. The inferior vena cava is normal in size with <50% respiratory  variability, suggesting right atrial pressure of 8 mmHg.   8. The patient was in atrial fibrillation.    Antimicrobials:  Anti-infectives (From admission, onward)    Start     Dose/Rate Route Frequency Ordered Stop   08/14/22 1915  ampicillin-sulbactam (UNASYN) 1.5 g in sodium chloride 0.9 % 100 mL IVPB  1.5 g 200 mL/hr over 30 Minutes Intravenous Every 12 hours 08/14/22 1817 08/19/22 1929   08/13/22 1923  vancomycin variable dose per unstable renal function (pharmacist dosing)  Status:  Discontinued         Does not apply See admin instructions  08/13/22 1924 08/13/22 2105   08/13/22 1830  vancomycin (VANCOREADY) IVPB 1250 mg/250 mL        1,250 mg 166.7 mL/hr over 90 Minutes Intravenous  Once 08/13/22 1828 08/13/22 2040   08/13/22 1815  ceFEPIme (MAXIPIME) 2 g in sodium chloride 0.9 % 100 mL IVPB        2 g 200 mL/hr over 30 Minutes Intravenous  Once 08/13/22 1804 08/13/22 1844       Subjective: Notes abdominal discomfort today Denies SOB  Objective: Vitals:   08/20/22 0300 08/20/22 0400 08/20/22 0500 08/20/22 0736  BP: (!) 105/51 96/60  116/61  Pulse: (!) 102 100  (!) 113  Resp: '12 15  19  '$ Temp: 97.6 F (36.4 C)   97.6 F (36.4 C)  TempSrc: Oral   Axillary  SpO2: 100% 100%  100%  Weight:   72.9 kg   Height:        Intake/Output Summary (Last 24 hours) at 08/20/2022 0818 Last data filed at 08/20/2022 0346 Gross per 24 hour  Intake 10 ml  Output 1200 ml  Net -1190 ml   Filed Weights   08/18/22 0500 08/19/22 0428 08/20/22 0500  Weight: 70.8 kg 73.8 kg 72.9 kg    Examination:  General: No acute distress. Cardiovascular: tachycardic Lungs: bibasilar crackles, crackles midway up lung fields Abdomen: Soft, nontender, nondistended Neurological: Alert. Moves all extremities 4 . Cranial nerves II through XII grossly intact. Extremities: trace edema   Data Reviewed: I have personally reviewed following labs and imaging studies  CBC: Recent Labs  Lab 08/15/22 0024 08/16/22 0023 08/17/22 0027 08/18/22 0021 08/19/22 0017  WBC 11.8* 8.6 8.0 4.4 5.3  NEUTROABS 9.5* 6.2 5.6 2.5 4.4  HGB 9.8* 9.3* 8.6* 8.5* 8.6*  HCT 32.3* 30.6* 27.5* 27.3* 28.3*  MCV 89.0 90.0 87.0 85.8 87.9  PLT 193 165 157 145* 076    Basic Metabolic Panel: Recent Labs  Lab 08/15/22 0024 08/16/22 0023 08/17/22 0027 08/18/22 0021 08/19/22 0017  NA 139 138 135 134* 132*  K 4.2 4.7 4.2 4.2 4.5  CL 104 104 102 102 101  CO2 23 16* '22 25 25  '$ GLUCOSE 108* 112* 135* 81 145*  BUN 38* 49* 50* 51* 46*  CREATININE 2.55* 3.04* 3.17*  3.19* 2.55*  CALCIUM 8.4* 8.4* 8.1* 8.1* 8.2*  MG 1.3* 2.9* 2.7* 2.8* 2.6*  PHOS 4.7* 4.9* 4.8* 5.0* 4.6    GFR: Estimated Creatinine Clearance: 13.8 mL/min (Collie Wernick) (by C-G formula based on SCr of 2.55 mg/dL (H)).  Liver Function Tests: Recent Labs  Lab 08/15/22 0024 08/16/22 0023 08/17/22 0027 08/18/22 0021 08/19/22 0017  AST 12* 15 10* 9* 12*  ALT '11 10 10 10 10  '$ ALKPHOS 61 55 45 49 47  BILITOT 0.5 0.4 0.5 0.4 0.8  PROT 6.5 6.1* 5.4* 5.4* 6.1*  ALBUMIN 3.1* 2.9* 2.6* 2.5* 2.7*    CBG: Recent Labs  Lab 08/19/22 0617 08/19/22 1138 08/19/22 1559 08/19/22 2122 08/20/22 0605  GLUCAP 113* 146* 163* 153* 94     Recent Results (from the past 240 hour(s))  Culture, blood (Routine x 2)     Status: None   Collection Time: 08/13/22  5:40 PM   Specimen:  BLOOD  Result Value Ref Range Status   Specimen Description BLOOD RIGHT ANTECUBITAL  Final   Special Requests   Final    BOTTLES DRAWN AEROBIC AND ANAEROBIC Blood Culture adequate volume   Culture   Final    NO GROWTH 5 DAYS Performed at [facility], [address] [place], [place] [address]    Report Status 08/18/2022 FINAL  Final  Culture, blood (Routine x 2)     Status: None   Collection Time: 08/13/22  5:50 PM   Specimen: BLOOD RIGHT HAND  Result Value Ref Range Status   Specimen Description BLOOD RIGHT HAND  Final   Special Requests   Final    BOTTLES DRAWN AEROBIC AND ANAEROBIC Blood Culture results may not be optimal due to an inadequate volume of blood received in culture bottles   Culture   Final    NO GROWTH 5 DAYS Performed at [facility], [place] [address] [place], [place] [address]    Report Status 08/18/2022 FINAL  Final  MRSA Next Gen by PCR, Nasal     Status: None   Collection Time: 08/13/22  6:29 PM   Specimen: Nasal Mucosa; Nasal Swab  Result Value Ref Range Status   MRSA by PCR Next Gen NOT DETECTED NOT DETECTED Final    Comment: (NOTE) The GeneXpert MRSA Assay (FDA approved for  NASAL specimens only), is one component of Brylee Berk comprehensive MRSA colonization surveillance program. It is not intended to diagnose MRSA infection nor to guide or monitor treatment for MRSA infections. Test performance is not FDA approved in patients less than 83 years old. Performed at [facility], [place] [address] [place], [place] [address]   Respiratory (~20 pathogens) panel by PCR     Status: None   Collection Time: 08/19/22 10:10 AM   Specimen: Nasopharyngeal Swab; Respiratory  Result Value Ref Range Status   Adenovirus NOT DETECTED NOT DETECTED Final   Coronavirus 229E NOT DETECTED NOT DETECTED Final    Comment: (NOTE) The Coronavirus on the Respiratory Panel, DOES NOT test for the novel  Coronavirus (2019 nCoV)    Coronavirus HKU1 NOT DETECTED NOT DETECTED Final   Coronavirus NL63 NOT DETECTED NOT DETECTED Final   Coronavirus OC43 NOT DETECTED NOT DETECTED Final   Metapneumovirus NOT DETECTED NOT DETECTED Final   Rhinovirus / Enterovirus NOT DETECTED NOT DETECTED Final   Influenza Carmela Piechowski NOT DETECTED NOT DETECTED Final   Influenza B NOT DETECTED NOT DETECTED Final   Parainfluenza Virus 1 NOT DETECTED NOT DETECTED Final   Parainfluenza Virus 2 NOT DETECTED NOT DETECTED Final   Parainfluenza Virus 3 NOT DETECTED NOT DETECTED Final   Parainfluenza Virus 4 NOT DETECTED NOT DETECTED Final   Respiratory Syncytial Virus NOT DETECTED NOT DETECTED Final   Bordetella pertussis NOT DETECTED NOT DETECTED Final   Bordetella Parapertussis NOT DETECTED NOT DETECTED Final   Chlamydophila pneumoniae NOT DETECTED NOT DETECTED Final   Mycoplasma pneumoniae NOT DETECTED NOT DETECTED Final    Comment: Performed at [facility], [place]. [address], [place], [place] [address]  SARS Coronavirus 2 by RT PCR (hospital order, performed in [facility] hospital lab) *cepheid single result test*     Status: None   Collection Time: 08/19/22 10:10 AM  Result Value Ref Range Status   SARS Coronavirus 2  by RT PCR NEGATIVE NEGATIVE Final    Comment: (NOTE) SARS-CoV-2 target nucleic acids are NOT DETECTED.  The SARS-CoV-2 RNA is generally  detectable in upper and lower respiratory specimens during the acute phase of infection. The lowest concentration of SARS-CoV-2 viral copies this assay can detect is 250 copies / mL. Antonio Creswell negative result does not preclude SARS-CoV-2 infection and should not be used as the sole basis for treatment or other patient management decisions.  Kashara Blocher negative result may occur with improper specimen collection / handling, submission of specimen other than nasopharyngeal swab, presence of viral mutation(s) within the areas targeted by this assay, and inadequate number of viral copies (<250 copies / mL). Kyshawn Teal negative result must be combined with clinical observations, patient history, and epidemiological information.  Fact Sheet for Patients:   https://www.patel.info/  Fact Sheet for Healthcare Providers: https://hall.com/  This test is not yet approved or  cleared by the [place] FDA and has been authorized for detection and/or diagnosis of SARS-CoV-2 by FDA under an Emergency Use Authorization (EUA).  This EUA will remain in effect (meaning this test can be used) for the duration of the COVID-19 declaration under Section 564(b)(1) of the Act, 21 U.S.C. section 360bbb-3(b)(1), unless the authorization is terminated or revoked sooner.  Performed at [facility], [place] [address] [place], [place] [address]          Radiology Studies: DG CHEST PORT 1 VIEW  Result Date: 08/19/2022 CLINICAL DATA:  Shortness of breath. EXAM: PORTABLE CHEST 1 VIEW COMPARISON:  Radiograph yesterday FINDINGS: Increasing multifocal lung opacities, greatest in the lung bases, but also increasing in the suprahilar regions. Stable heart size and mediastinal contours. Suspect developing pleural effusions. No pneumothorax. IMPRESSION: 1.  Increasing multifocal lung opacities, greatest in the lung bases, but also increasing in the suprahilar regions. Findings suspicious for worsening multifocal pneumonia. 2. Suspect developing pleural effusions. Electronically Signed   By: Keith Rake M.D.   On: 08/19/2022 17:30   DG CHEST PORT 1 VIEW  Result Date: 08/18/2022 CLINICAL DATA:  Shortness of breath. EXAM: PORTABLE CHEST 1 VIEW COMPARISON:  Radiograph 08/16/2022 FINDINGS: Stable upper normal heart size. Unchanged mediastinal contours. Aortic atherosclerosis. Similar appearance of basilar predominant airspace opacities without significant interval change. Possible small left pleural effusion. No pneumothorax. IMPRESSION: Unchanged basilar predominant airspace opacities, suspicious for atypical pneumonia. Possible small left pleural effusion. Electronically Signed   By: Keith Rake M.D.   On: 08/18/2022 17:44        Scheduled Meds:  amiodarone  400 mg Oral BID   apixaban  2.5 mg Oral BID   Chlorhexidine Gluconate Cloth  6 each Topical Q0600   FLUoxetine  20 mg Oral Q breakfast   furosemide  20 mg Intravenous TID   insulin aspart  0-9 Units Subcutaneous TID WC   insulin detemir  9 Units Subcutaneous Daily   levothyroxine  50 mcg Oral QAC breakfast   midodrine  5 mg Oral TID WC   polyethylene glycol  17 g Oral BID   pramipexole  0.5 mg Oral QHS   QUEtiapine  100 mg Oral QHS   senna-docusate  1 tablet Oral QHS   sodium chloride flush  3 mL Intravenous Q12H   Continuous Infusions:     LOS: 7 days    Time spent: over 30 min    Fayrene Helper, MD [facility]   To contact the attending provider between 7A-7P or the covering provider during after hours 7P-7A, please log into the web site [email or web address] and access using universal  password for that web site. If you do not have the password, please  call the hospital operator.  08/20/2022, 8:18 AM

## 2022-08-20 NOTE — Care Management Important Message (Signed)
Important Message  Patient Details  Name: Melissa [name] MRN: [id number] Date of Birth: Feb 18, 1928   Medicare Important Message Given:  Yes     Akeel Reffner 08/20/2022, 2:22 PM

## 2022-08-20 NOTE — Progress Notes (Signed)
[facility] Progress Note   Assessment/ Plan:    AKI on CKD 3b: likely in the setting of hypoperfusion, Afib with RVR, sepsis, ongoing hypotension             - appears that Cr has plateaued and now improved today. Main concern now is whether pt has enough intake to maintain good UOP             - PO intake continues to be poor but I'd like to avoid another bolus as she's already edematous w/ rales.  - renal US- no hydro 08/13/22             - persistent hypotension in the setting of RV and LV dysfunction- added midodrine 5 mg TID, BP are better  Renal function continues to improve. Signing off at this time; please reconsult as needed.    2.  Afib with RVR:             - controlled now,  on amio and Eliquis   3.  Sepsis:             - likely d/t aspiration, on Unasyn   4.  DM II             - per primary   5.  Dispo: admitted  Subjective:    Seen in room.  Not eating and drinking a hole los.  Cr improving; BP are better on midodrine.        Objective:   BP 116/61 (BP Location: Left Arm)   Pulse (!) 113   Temp 97.6 F (36.4 C) (Axillary)   Resp 19   Ht '5\' 4"'$  (1.626 m)   Wt 72.9 kg   SpO2 100%   BMI 27.59 kg/m   Physical Exam: GEN: NAD, sitting in chair and cooperative HEENT EOMI NECK no JVD PULM rales at the bases CV RRR ABD soft EXT tr LE edema NEURO AAO x 3 nonfocal SKIN warm and dry  Labs: BMET Recent Labs  Lab 08/14/22 0518 08/15/22 0024 08/16/22 0023 08/17/22 0027 08/18/22 0021 08/19/22 0017 08/20/22 0734  NA 139 139 138 135 134* 132* 135  K 4.5 4.2 4.7 4.2 4.2 4.5 4.3  CL 105 104 104 102 102 101 99  CO2 21* 23 16* '22 25 25 27  '$ GLUCOSE 226* 108* 112* 135* 81 145* 136*  BUN 36* 38* 49* 50* 51* 46* 42*  CREATININE 2.53* 2.55* 3.04* 3.17* 3.19* 2.55* 2.05*  CALCIUM 7.9* 8.4* 8.4* 8.1* 8.1* 8.2* 8.5*  PHOS  --  4.7* 4.9* 4.8* 5.0* 4.6  --    CBC Recent Labs  Lab 08/17/22 0027 08/18/22 0021 08/19/22 0017 08/20/22 0734  WBC  8.0 4.4 5.3 6.4  NEUTROABS 5.6 2.5 4.4 4.5  HGB 8.6* 8.5* 8.6* 9.5*  HCT 27.5* 27.3* 28.3* 30.2*  MCV 87.0 85.8 87.9 85.8  PLT 157 145* 170 180      Medications:     amiodarone  400 mg Oral BID   apixaban  2.5 mg Oral BID   Chlorhexidine Gluconate Cloth  6 each Topical Q0600   FLUoxetine  20 mg Oral Q breakfast   insulin aspart  0-9 Units Subcutaneous TID WC   insulin detemir  9 Units Subcutaneous Daily   levothyroxine  50 mcg Oral QAC breakfast   midodrine  5 mg Oral TID WC   polyethylene glycol  17 g Oral BID   pramipexole  0.5 mg Oral QHS   QUEtiapine  100 mg Oral QHS   senna-docusate  1 tablet Oral QHS   sodium chloride flush  3 mL Intravenous Q12H    08/20/2022, 10:08 AM

## 2022-08-21 ENCOUNTER — Inpatient Hospital Stay (HOSPITAL_COMMUNITY): Payer: Medicare Other

## 2022-08-21 DIAGNOSIS — N179 Acute kidney failure, unspecified: Secondary | ICD-10-CM | POA: Diagnosis not present

## 2022-08-21 DIAGNOSIS — E039 Hypothyroidism, unspecified: Secondary | ICD-10-CM

## 2022-08-21 DIAGNOSIS — Z789 Other specified health status: Secondary | ICD-10-CM

## 2022-08-21 DIAGNOSIS — Z515 Encounter for palliative care: Secondary | ICD-10-CM

## 2022-08-21 DIAGNOSIS — N1832 Chronic kidney disease, stage 3b: Secondary | ICD-10-CM

## 2022-08-21 DIAGNOSIS — I5043 Acute on chronic combined systolic (congestive) and diastolic (congestive) heart failure: Secondary | ICD-10-CM | POA: Diagnosis not present

## 2022-08-21 DIAGNOSIS — I480 Paroxysmal atrial fibrillation: Secondary | ICD-10-CM | POA: Diagnosis not present

## 2022-08-21 DIAGNOSIS — I5022 Chronic systolic (congestive) heart failure: Secondary | ICD-10-CM

## 2022-08-21 LAB — COMPREHENSIVE METABOLIC PANEL
ALT: 12 U/L (ref 0–44)
AST: 10 U/L — ABNORMAL LOW (ref 15–41)
Albumin: 2.9 g/dL — ABNORMAL LOW (ref 3.5–5.0)
Alkaline Phosphatase: 48 U/L (ref 38–126)
Anion gap: 9 (ref 5–15)
BUN: 38 mg/dL — ABNORMAL HIGH (ref 8–23)
CO2: 26 mmol/L (ref 22–32)
Calcium: 8.6 mg/dL — ABNORMAL LOW (ref 8.9–10.3)
Chloride: 99 mmol/L (ref 98–111)
Creatinine, Ser: 1.84 mg/dL — ABNORMAL HIGH (ref 0.44–1.00)
GFR, Estimated: 25 mL/min — ABNORMAL LOW (ref >60)
Glucose, Bld: 125 mg/dL — ABNORMAL HIGH (ref 70–99)
Potassium: 4.5 mmol/L (ref 3.5–5.1)
Sodium: 134 mmol/L — ABNORMAL LOW (ref 135–145)
Total Bilirubin: 0.6 mg/dL (ref 0.3–1.2)
Total Protein: 5.9 g/dL — ABNORMAL LOW (ref 6.5–8.1)

## 2022-08-21 LAB — CBC WITH DIFFERENTIAL/PLATELET
Abs Immature Granulocytes: 0.03 10*3/uL (ref 0.00–0.07)
Basophils Absolute: 0 10*3/uL (ref 0.0–0.1)
Basophils Relative: 1 %
Eosinophils Absolute: 0.2 10*3/uL (ref 0.0–0.5)
Eosinophils Relative: 3 %
HCT: 29.9 % — ABNORMAL LOW (ref 36.0–46.0)
Hemoglobin: 9.7 g/dL — ABNORMAL LOW (ref 12.0–15.0)
Immature Granulocytes: 1 %
Lymphocytes Relative: 16 %
Lymphs Abs: 1.1 10*3/uL (ref 0.7–4.0)
MCH: 27.7 pg (ref 26.0–34.0)
MCHC: 32.4 g/dL (ref 30.0–36.0)
MCV: 85.4 fL (ref 80.0–100.0)
Monocytes Absolute: 0.6 10*3/uL (ref 0.1–1.0)
Monocytes Relative: 9 %
Neutro Abs: 4.6 10*3/uL (ref 1.7–7.7)
Neutrophils Relative %: 70 %
Platelets: 186 10*3/uL (ref 150–400)
RBC: 3.5 MIL/uL — ABNORMAL LOW (ref 3.87–5.11)
RDW: 17.7 % — ABNORMAL HIGH (ref 11.5–15.5)
WBC: 6.5 10*3/uL (ref 4.0–10.5)
nRBC: 0 % (ref 0.0–0.2)

## 2022-08-21 LAB — IRON AND TIBC
Iron: 34 ug/dL (ref 28–170)
Saturation Ratios: 13 % (ref 10.4–31.8)
TIBC: 260 ug/dL (ref 250–450)
UIBC: 226 ug/dL

## 2022-08-21 LAB — LIPASE, BLOOD: Lipase: 59 U/L — ABNORMAL HIGH (ref 11–51)

## 2022-08-21 LAB — GLUCOSE, CAPILLARY
Glucose-Capillary: 130 mg/dL — ABNORMAL HIGH (ref 70–99)
Glucose-Capillary: 185 mg/dL — ABNORMAL HIGH (ref 70–99)
Glucose-Capillary: 158 mg/dL — ABNORMAL HIGH (ref 70–99)
Glucose-Capillary: 168 mg/dL — ABNORMAL HIGH (ref 70–99)

## 2022-08-21 LAB — FOLATE: Folate: 8.7 ng/mL (ref >5.9)

## 2022-08-21 LAB — LEGIONELLA PNEUMOPHILA SEROGP 1 UR AG: L. pneumophila Serogp 1 Ur Ag: NEGATIVE

## 2022-08-21 LAB — FERRITIN: Ferritin: 139 ng/mL (ref 11–307)

## 2022-08-21 LAB — VITAMIN B12: Vitamin B-12: 1032 pg/mL — ABNORMAL HIGH (ref 180–914)

## 2022-08-21 LAB — MAGNESIUM: Magnesium: 2.3 mg/dL (ref 1.7–2.4)

## 2022-08-21 LAB — PHOSPHORUS: Phosphorus: 3.1 mg/dL (ref 2.5–4.6)

## 2022-08-21 MED ORDER — AMIODARONE HCL IN DEXTROSE 360-4.14 MG/200ML-% IV SOLN
60.0000 mg/h | INTRAVENOUS | Status: AC
  Administered 2022-08-21 (×2): 60 mg/h via INTRAVENOUS
  Filled 2022-08-21: qty 200

## 2022-08-21 MED ORDER — FUROSEMIDE 10 MG/ML IJ SOLN
40.0000 mg | Freq: Once | INTRAMUSCULAR | Status: AC
  Administered 2022-08-22: 40 mg via INTRAVENOUS
  Filled 2022-08-21: qty 4

## 2022-08-21 MED ORDER — AMIODARONE HCL 200 MG PO TABS: 200.0000 mg | ORAL_TABLET | Freq: Every day | ORAL | Status: DC

## 2022-08-21 MED ORDER — MORPHINE SULFATE (PF) 2 MG/ML IV SOLN: 1.0000 mg | Freq: Once | INTRAVENOUS | Status: DC

## 2022-08-21 MED ORDER — IPRATROPIUM-ALBUTEROL 0.5-2.5 (3) MG/3ML IN SOLN
3.0000 mL | Freq: Four times a day (QID) | RESPIRATORY_TRACT | Status: DC
  Administered 2022-08-22 (×3): 3 mL via RESPIRATORY_TRACT
  Filled 2022-08-21 (×3): qty 3

## 2022-08-21 MED ORDER — AMIODARONE HCL IN DEXTROSE 360-4.14 MG/200ML-% IV SOLN
30.0000 mg/h | INTRAVENOUS | Status: DC
  Administered 2022-08-21 – 2022-08-25 (×7): 30 mg/h via INTRAVENOUS
  Filled 2022-08-21 (×8): qty 200

## 2022-08-21 NOTE — Progress Notes (Signed)
Pt is showing Labored Breathing. Audible Crackles and wheezing. RT Following.  08/21/22 2055  Assess: MEWS Score  Temp 98 F (36.7 C)  BP (!) 141/75  MAP (mmHg) 90  Pulse Rate (!) 109  ECG Heart Rate (!) 107  Resp (!) 27  SpO2 100 %  O2 Device Nasal Cannula  O2 Flow Rate (L/min) 3 L/min  Assess: MEWS Score  MEWS Temp 0  MEWS Systolic 0  MEWS Pulse 1  MEWS RR 2  MEWS LOC 0  MEWS Score 3  MEWS Score Color Yellow  Assess: if the MEWS score is Yellow or Red  Were vital signs taken at a resting state? Yes  Focused Assessment No change from prior assessment  Does the patient meet 2 or more of the SIRS criteria? Yes  Does the patient have a confirmed or suspected source of infection? Yes  Provider and Rapid Response Notified? No  MEWS guidelines implemented *See Row Information* No, previously yellow, continue vital signs every 4 hours  Treat  Pain Scale 0-10  Pain Score 0  Escalate  MEWS: Escalate Yellow: discuss with charge nurse/RN and consider discussing with provider and RRT  Notify: Charge Nurse/RN  Name of Charge Nurse/RN Notified Janett Billow RN  Date Charge Nurse/RN Notified 08/21/22  Time Charge Nurse/RN Notified 2110  Notify: Provider  Provider Name/Title Dr Sidney Ace  Date Provider Notified 08/21/22  Time Provider Notified 2100  Method of Notification Page  Notification Reason Change in status  Provider response See new orders  Date of Provider Response 08/21/22  Time of Provider Response 2110  Document  Patient Outcome Stabilized after interventions  Progress note created (see row info) Yes  Assess: SIRS CRITERIA  SIRS Temperature  0  SIRS Pulse 1  SIRS Respirations  1  SIRS WBC 1  SIRS Score Sum  3

## 2022-08-21 NOTE — Progress Notes (Addendum)
Rounding Note    Patient Name: Melissa [name] Date of Encounter: 08/21/2022  [place] Cardiologist: [name], MD   Subjective   No CP, maybe a little SOB, primarily is bothered by belly discomfort  Inpatient Medications    Scheduled Meds:  apixaban  2.5 mg Oral BID   calcium carbonate  1 tablet Oral BID   Chlorhexidine Gluconate Cloth  6 each Topical Q0600   FLUoxetine  20 mg Oral Q breakfast   insulin aspart  0-9 Units Subcutaneous TID WC   insulin detemir  9 Units Subcutaneous Daily   levothyroxine  50 mcg Oral QAC breakfast   midodrine  5 mg Oral TID WC   polyethylene glycol  17 g Oral BID   pramipexole  0.5 mg Oral QHS   QUEtiapine  100 mg Oral QHS   senna-docusate  1 tablet Oral QHS   sodium chloride flush  3 mL Intravenous Q12H   Continuous Infusions:  amiodarone     amiodarone     PRN Meds: acetaminophen **OR** acetaminophen, albuterol, guaiFENesin-dextromethorphan, morphine injection, ondansetron **OR** ondansetron (ZOFRAN) IV, mouth rinse   Vital Signs    Vitals:   08/21/22 0415 08/21/22 0500 08/21/22 0743 08/21/22 1143  BP:  139/76 120/71 116/82  Pulse:  (!) 118 (!) 113 (!) 115  Resp:  17 (!) 21 20  Temp:  98 F (36.7 C) 98 F (36.7 C) 97.9 F (36.6 C)  TempSrc:  Oral Oral Oral  SpO2:   100% 100%  Weight: 70.2 kg     Height:        Intake/Output Summary (Last 24 hours) at 08/21/2022 1419 Last data filed at 08/21/2022 0747 Gross per 24 hour  Intake 480 ml  Output 1103 ml  Net -623 ml      08/21/2022    4:15 AM 08/20/2022    5:00 AM 08/19/2022    4:28 AM  Last 3 Weights  Weight (lbs) 154 lb 12.2 oz 160 lb 11.5 oz 162 lb 11.2 oz  Weight (kg) 70.2 kg 72.9 kg 73.8 kg      Telemetry    AFib 90's-130's, mostly 110's - 120 - Personally Reviewed  ECG    No new EKGs - Personally Reviewed  Physical Exam   GEN: No acute distress.   Neck: No JVD Cardiac: irreg-irreg, no murmurs, rubs, or gallops.  Respiratory: some crackles  at the bases b/l. GI: Soft, nontender, non-distended, + normal BS all over MS: trace edema; No deformity. Neuro:  AAO self, daughter in the room, follows commands, no focal deficits Psych: Normal affect   Labs    High Sensitivity Troponin:   Recent Labs  Lab 08/13/22 1957 08/16/22 1535 08/16/22 1806  TROPONINIHS '12 14 13     '$ Chemistry Recent Labs  Lab 08/18/22 0021 08/19/22 0017 08/20/22 0734 08/21/22 0027  NA 134* 132* 135 134*  K 4.2 4.5 4.3 4.5  CL 102 101 99 99  CO2 '25 25 27 26  '$ GLUCOSE 81 145* 136* 125*  BUN 51* 46* 42* 38*  CREATININE 3.19* 2.55* 2.05* 1.84*  CALCIUM 8.1* 8.2* 8.5* 8.6*  MG 2.8* 2.6*  --  2.3  PROT 5.4* 6.1* 6.4* 5.9*  ALBUMIN 2.5* 2.7* 3.0* 2.9*  AST 9* 12* 11* 10*  ALT '10 10 13 12  '$ ALKPHOS 49 47 50 48  BILITOT 0.4 0.8 0.4 0.6  GFRNONAA 13* 17* 22* 25*  ANIONGAP '7 6 9 9    '$ Lipids No results  for input(s): "CHOL", "TRIG", "HDL", "LABVLDL", "LDLCALC", "CHOLHDL" in the last 168 hours.  Hematology Recent Labs  Lab 08/19/22 0017 08/20/22 0734 08/21/22 0027  WBC 5.3 6.4 6.5  RBC 3.22* 3.52* 3.50*  HGB 8.6* 9.5* 9.7*  HCT 28.3* 30.2* 29.9*  MCV 87.9 85.8 85.4  MCH 26.7 27.0 27.7  MCHC 30.4 31.5 32.4  RDW 17.5* 17.5* 17.7*  PLT 170 180 186   Thyroid  Recent Labs  Lab 08/16/22 0023  TSH 3.352    BNP Recent Labs  Lab 08/19/22 0017  BNP 1,406.9*    DDimer No results for input(s): "DDIMER" in the last 168 hours.   Radiology    DG Abd 1 View  Result Date: 08/21/2022 CLINICAL DATA:  Abdominal pain and constipation EXAM: ABDOMEN - 1 VIEW COMPARISON:  CT 12/31/2021 FINDINGS: Cholecystectomy clips in the right upper quadrant. No dilated small or large bowel. Overall amount of fecal matter within the colon appears within normal limits. No significant bone finding. IMPRESSION: No acute finding. Overall amount of fecal matter within the colon appears within normal limits. Electronically Signed   By: Nelson Chimes M.D.   On: 08/21/2022  10:14   DG CHEST PORT 1 VIEW  Result Date: 08/19/2022 CLINICAL DATA:  Shortness of breath. EXAM: PORTABLE CHEST 1 VIEW COMPARISON:  Radiograph yesterday FINDINGS: Increasing multifocal lung opacities, greatest in the lung bases, but also increasing in the suprahilar regions. Stable heart size and mediastinal contours. Suspect developing pleural effusions. No pneumothorax. IMPRESSION: 1. Increasing multifocal lung opacities, greatest in the lung bases, but also increasing in the suprahilar regions. Findings suspicious for worsening multifocal pneumonia. 2. Suspect developing pleural effusions. Electronically Signed   By: Keith Rake M.D.   On: 08/19/2022 17:30    Cardiac Studies   Echocardiogram 08/14/22 1. Left ventricular ejection fraction, by estimation, is 25%. The left  ventricle has severely decreased function. The left ventricle demonstrates  global hypokinesis. Left ventricular diastolic parameters are  indeterminate.   2. Right ventricular systolic function is moderately reduced. The right  ventricular size is normal. There is normal pulmonary artery systolic  pressure. The estimated right ventricular systolic pressure is 94.1 mmHg.   3. Left atrial size was moderately dilated.   4. The mitral valve is abnormal with restricted posterior leaflet.  Moderate mitral valve regurgitation. No evidence of mitral stenosis.   5. Tricuspid valve regurgitation is mild to moderate.   6. The aortic valve is tricuspid. Aortic valve regurgitation is not  visualized. No aortic stenosis is present.   7. The inferior vena cava is normal in size with <50% respiratory  variability, suggesting right atrial pressure of 8 mmHg.   8. The patient was in atrial fibrillation.   Patient Profile     88 y.o. female NICM,chronic CHF (systolic), AFib, DM, CKD (IIIb), GERD, delusional parasitosis, VHD (mod MR), admitted with progressive fatigue, SON, palpitations.  Found in Afib w/RVR Suspected to be  septic/PNA (aspiration, with recent recurrent PNAs) AKI Hypotensive  Sepsis r/o Abx for possible PNA   Assessment & Plan    Paroxysmal Afib CHA2DS2Vasc is 5, on Eliquis,  appropriately dosed  Rates initially had fair control with amio gtt >> had CV to SR 08/15/22 and transitioned to PO  Back in Afib 11/4 and PO dose increased, her BP/renal function limiting options  Rates have been difficult to control with BP limitations, she is on midodrine via nephrology to augment BP  BP are better with midodrine, I think she will  tolerate Amio gtt > already resumed Current rates low 100s Would continue until belly discomfort, respiratory status is better, some delirium perhaps contributes to elevated rates as well.  Perhaps high PO dose amio contributes to belly discomfort Hopefully IV will be better if so   Acute/chronic CHF (systolic) ? PNA Initially diuretics held with AKI/hypotension though developed volume OL/crackles Diuresis resumed. Euvolemia has been difficult to keep Managed with attending cardiology and nephrology teams  AKI/CKD (III) Nephrology on board Creat improving to 1.8 today  Delirium Suspected to be hospital delirium  5. Anemia  H/H wobbling but looks chronic  6. Belly discomfort Locates to umbilicus, nontender KUB ok this AM Deferred to IM   For questions or updates, please contact [place] Please consult [email or web address] for contact info under        Signed, Baldwin [place], PA-C  08/21/2022, 2:19 PM

## 2022-08-21 NOTE — Progress Notes (Signed)
Rounding Note    Patient Name: Melissa [name] Date of Encounter: 08/21/2022  [place] Cardiologist: [name], MD   Subjective   Received lasix yesterday for rales and respiratory distress, this has improved but she was hypotensive again yesterday. Remains delirious. Daughter at bedside today.   Inpatient Medications    Scheduled Meds:  [START ON 08/22/2022] amiodarone  200 mg Oral Daily   apixaban  2.5 mg Oral BID   calcium carbonate  1 tablet Oral BID   Chlorhexidine Gluconate Cloth  6 each Topical Q0600   FLUoxetine  20 mg Oral Q breakfast   insulin aspart  0-9 Units Subcutaneous TID WC   insulin detemir  9 Units Subcutaneous Daily   levothyroxine  50 mcg Oral QAC breakfast   midodrine  5 mg Oral TID WC   polyethylene glycol  17 g Oral BID   pramipexole  0.5 mg Oral QHS   QUEtiapine  100 mg Oral QHS   senna-docusate  1 tablet Oral QHS   sodium chloride flush  3 mL Intravenous Q12H   Continuous Infusions:   PRN Meds: acetaminophen **OR** acetaminophen, albuterol, guaiFENesin-dextromethorphan, morphine injection, ondansetron **OR** ondansetron (ZOFRAN) IV, mouth rinse   Vital Signs    Vitals:   08/21/22 0415 08/21/22 0500 08/21/22 0743 08/21/22 1143  BP:  139/76 120/71 116/82  Pulse:  (!) 118 (!) 113 (!) 115  Resp:  17 (!) 21 20  Temp:  98 F (36.7 C) 98 F (36.7 C) 97.9 F (36.6 C)  TempSrc:  Oral Oral Oral  SpO2:   100% 100%  Weight: 70.2 kg     Height:        Intake/Output Summary (Last 24 hours) at 08/21/2022 1304 Last data filed at 08/21/2022 0747 Gross per 24 hour  Intake 480 ml  Output 1103 ml  Net -623 ml      08/21/2022    4:15 AM 08/20/2022    5:00 AM 08/19/2022    4:28 AM  Last 3 Weights  Weight (lbs) 154 lb 12.2 oz 160 lb 11.5 oz 162 lb 11.2 oz  Weight (kg) 70.2 kg 72.9 kg 73.8 kg      Telemetry    Afib 120s - Personally Reviewed  ECG    Afib - Personally Reviewed  Physical Exam   GEN: No acute distress Neck: No  JVD Cardiac: irregular and tachycardic Respiratory: crackles in bases GI: Soft, nontender, non-distended  MS: no edema; No deformity. Cold on exam Neuro:  Nonfocal  Psych: Normal affect, not oriented  Labs    High Sensitivity Troponin:   Recent Labs  Lab 08/13/22 1957 08/16/22 1535 08/16/22 1806  TROPONINIHS '12 14 13     '$ Chemistry Recent Labs  Lab 08/18/22 0021 08/19/22 0017 08/20/22 0734 08/21/22 0027  NA 134* 132* 135 134*  K 4.2 4.5 4.3 4.5  CL 102 101 99 99  CO2 '25 25 27 26  '$ GLUCOSE 81 145* 136* 125*  BUN 51* 46* 42* 38*  CREATININE 3.19* 2.55* 2.05* 1.84*  CALCIUM 8.1* 8.2* 8.5* 8.6*  MG 2.8* 2.6*  --  2.3  PROT 5.4* 6.1* 6.4* 5.9*  ALBUMIN 2.5* 2.7* 3.0* 2.9*  AST 9* 12* 11* 10*  ALT '10 10 13 12  '$ ALKPHOS 49 47 50 48  BILITOT 0.4 0.8 0.4 0.6  GFRNONAA 13* 17* 22* 25*  ANIONGAP '7 6 9 9    '$ Lipids No results for input(s): "CHOL", "TRIG", "HDL", "LABVLDL", "LDLCALC", "CHOLHDL" in the  last 168 hours.  Hematology Recent Labs  Lab 08/19/22 0017 08/20/22 0734 08/21/22 0027  WBC 5.3 6.4 6.5  RBC 3.22* 3.52* 3.50*  HGB 8.6* 9.5* 9.7*  HCT 28.3* 30.2* 29.9*  MCV 87.9 85.8 85.4  MCH 26.7 27.0 27.7  MCHC 30.4 31.5 32.4  RDW 17.5* 17.5* 17.7*  PLT 170 180 186   Thyroid  Recent Labs  Lab 08/16/22 0023  TSH 3.352    BNP Recent Labs  Lab 08/19/22 0017  BNP 1,406.9*    DDimer No results for input(s): "DDIMER" in the last 168 hours.   Radiology    DG Abd 1 View  Result Date: 08/21/2022 CLINICAL DATA:  Abdominal pain and constipation EXAM: ABDOMEN - 1 VIEW COMPARISON:  CT 12/31/2021 FINDINGS: Cholecystectomy clips in the right upper quadrant. No dilated small or large bowel. Overall amount of fecal matter within the colon appears within normal limits. No significant bone finding. IMPRESSION: No acute finding. Overall amount of fecal matter within the colon appears within normal limits. Electronically Signed   By: Nelson Chimes M.D.   On: 08/21/2022 10:14    DG CHEST PORT 1 VIEW  Result Date: 08/19/2022 CLINICAL DATA:  Shortness of breath. EXAM: PORTABLE CHEST 1 VIEW COMPARISON:  Radiograph yesterday FINDINGS: Increasing multifocal lung opacities, greatest in the lung bases, but also increasing in the suprahilar regions. Stable heart size and mediastinal contours. Suspect developing pleural effusions. No pneumothorax. IMPRESSION: 1. Increasing multifocal lung opacities, greatest in the lung bases, but also increasing in the suprahilar regions. Findings suspicious for worsening multifocal pneumonia. 2. Suspect developing pleural effusions. Electronically Signed   By: Keith Rake M.D.   On: 08/19/2022 17:30    Cardiac Studies   Echocardiogram 08/14/22 1. Left ventricular ejection fraction, by estimation, is 25%. The left  ventricle has severely decreased function. The left ventricle demonstrates  global hypokinesis. Left ventricular diastolic parameters are  indeterminate.   2. Right ventricular systolic function is moderately reduced. The right  ventricular size is normal. There is normal pulmonary artery systolic  pressure. The estimated right ventricular systolic pressure is 97.9 mmHg.   3. Left atrial size was moderately dilated.   4. The mitral valve is abnormal with restricted posterior leaflet.  Moderate mitral valve regurgitation. No evidence of mitral stenosis.   5. Tricuspid valve regurgitation is mild to moderate.   6. The aortic valve is tricuspid. Aortic valve regurgitation is not  visualized. No aortic stenosis is present.   7. The inferior vena cava is normal in size with <50% respiratory  variability, suggesting right atrial pressure of 8 mmHg.   8. The patient was in atrial fibrillation.   Patient Profile     88 y.o. female with a hx of atrial fib followed by [name], last seen 08/2021 also EF 20-25% NICM, delusional disorder, DM-2, GERD now admitted with sepsis, cardiology consulted for management of paroxysmal afib.  Had brief sinus rhythm in hospital but back in afib with RVR and hypotension. Note patient was admitted in 05/2021 with pneumonia, found to have A-fib with RVR.  She was started on Eliquis 2.5 twice daily, underwent cardioversion in 05/2021.  More recently admitted in 06/2022 with pneumonia, maintained normal sinus rhythm that admission. Now presented with shortness of breath. Found to be in afib with RVR  Assessment & Plan    Paroxysmal Atrial Fibrillation  - Treated with IV amiodarone and converted to NSR and then had recurrence of Afib.  - amio load complete, will  start 200 mg daily tomorrow. - consider IV amio 150 mg bolus administered slowly if further rate control needed. - Home metoprolol held due to soft BP while in Afib. BP is better with better rate control and addition of midodrine per neph. - Consider adding low dose metoprolol for rate control as needed, challenging with low BP - Continue Eliquis 2.5 mg twice daily (dose reduced due to age, renal function) - Echo this admission with EF 25%, moderately reduced RV function, moderately dilated LA, moderate mitral valve regurgitation  - Maintain K>4, mag>2    Chronic systolic heart failure - Most recent echocardiogram completed 04/2021 showed EF 20-25%, global hypokinesis, normal RV systolic function - Echo this admission with EF 25%, moderately reduced RV systolic function, moderate mitral valve regurgitation (restricted posterior leaflet)  -Home metoprolol held due to soft BP.  Not on ACE/ARB/ARNI or Arlyce Harman given CKD - She is cold on exam today, and we are having difficulty achieving rate control due to hypotension. I do not think she would tolerate sedation for elective cardioversion.  - she is at what is likely her dry weight, 70.2 kg today, 1 year ago in office weight was 71.2 kg. She is wheezing on exam. I am concerned about further diuresis given presentation of hypotension at admission. Could consider one additional dose of lasix but  cautiously.  - recommend goals of care discussion.    Hypotension - BP soft this admission  - Holding home BP medications  - Patient has received IV fluids, now with volume overload but improvement in renal function.  - Suspect BP will improve with HR control, has improved with midodrine. - midodrine added by nephrology. Maintaining BP.   AKI on CKD stage IIIb - baseline approximately 1.3 -Creatinine further increased to 3, improved today with hydration and subsequent lasix. Cr 1.84 today. - Holding home BP medications to avoid hypotension. - possible poor PO intake is contributing.    For questions or updates, please contact [place] Please consult [email or web address] for contact info under        Signed, Elouise Munroe, MD  08/21/2022, 1:04 PM

## 2022-08-21 NOTE — Progress Notes (Addendum)
PROGRESS NOTE    Melissa [name]  TKP:[id number] DOB: Aug 02, 1928 DOA: 08/13/2022 PCP: Darreld Mclean, MD  Chief Complaint  Patient presents with   Code Sepsis    Brief Narrative:  Melissa [name] is a 88 y.o. female with medical history significant for HFrEF (EF 20-25% per TTE 04/13/2021), NICM, PAF on Eliquis, CKD stage IIIb, insulin-dependent T2DM, HTN, hypothyroidism, HLD, anxiety, delusional parasitosis, mild cognitive impairment who is admitted with paroxysmal atrial fibrillation with RVR.  Seen by cardiology and started on IV amiodarone drip.  She was started on unasyn with concern for aspiration pneumonia as well.  She developed progressive acute kidney injury and renal was consulted.  Creatinine has plateau'd and is improving, but she's developed worsening CXR findings, rising weights suggestive of HF exacerbation.  Required bipap 11/6 as well as diuresis with lasix.    Subjective: complains of Abd pain, got laxatives last night with BMs x2   Assessment & Plan:    Assessment and Plan:  Acute on Chronic Hypoxic Respiratory failure  - on chronic supplemental O2 (3 L [place]),  - treated for PNA and CHF as BP tolerates -CXR with findings concerning for multifocal pna,  s/p 5 days unasyn for possible aspiration.  Required bipap 11/6 for developing resp distress, crackles,  - SLP eval -> mild aspiration risk, dysphagia 3/thin liquid diet recommended - pulm toilet, flutter valve -ambulate, wean O2  Acute renal failure superimposed on stage 3b chronic kidney disease ([place])  Acute Urinary Retention Creatinine 2.42 on admit compared to baseline around 1.3.  Suspected prerenal from hypoperfusion in setting of A-fib with RVR, hypotension -creatinine improving -Renal ultrasound with chronic medical renal disease -s/p indwelling foley catheter placed overnight 11/5-6.  Will attempt voiding trial tomorrow if stable -appreciate renal assistance   Acute on Chronic HFrEF (heart failure with  reduced ejection fraction) (HCC) EF 20-25% by last TTE 02/11/7000.  , complicated by AFib RVR, hypotension, AKI, repeat echo-> EF 25%, global hypokinesis, moderately reduced RVSF (see report) -given IV lasix intermittently -Hold Toprol-XL given hypotension -GDMT on hold, now on midodrine -Cardiology following Weight improving  Paroxysmal atrial fibrillation with rapid ventricular response ([place]) Presenting with A-fib with RVR, rate 140s.  Blood pressure soft.  Cardiology consulted and started IV amiodarone drip.  Tachycardia, tachypnea, hypotension all likely driven by A-fib with RVR - Sepsis ruled out -Cardiology following, appreciate assistance - transitioned to PO amiodarone, heart rate remains elevated, may need cardioversion -Hold Toprol-XL with soft blood pressure -Continue Eliquis 2.5 mg twice daily  Delirium Suspect this is all due to the acute hospitalization, SOB, hypoxic respiratory failure, improving Delirium precautions -Continue seroquel  Abd pain, constipation -2BMs yesterday after laxatives -LFTs ok, check KUB and lipase -has a foley for retention/AKi, check UA if develops fever or leukocytosis  Type 2 diabetes mellitus  -CBGs stable, continue current dose of Insulin  Hypothyroidism, acquired Continue home dose Synthroid 50 mcg daily.  Check TSH (3.5 wnl).   Hypomagnesemia Replace and follow  Anemia Suspect component of dilution, trend Anemia labs  DVT prophylaxis: eliquis Code Status: full Family Communication: None at bedside Disposition:   Status is: Inpatient Remains inpatient appropriate because: continued need for inpatient management   Consultants:  cardiology  Procedures:  Echo IMPRESSIONS     1. Left ventricular ejection fraction, by estimation, is 25%. The left  ventricle has severely decreased function. The left ventricle demonstrates  global hypokinesis. Left ventricular diastolic parameters are  indeterminate.   2. Right ventricular  systolic function is moderately reduced. The right  ventricular size is normal. There is normal pulmonary artery systolic  pressure. The estimated right ventricular systolic pressure is 83.4 mmHg.   3. Left atrial size was moderately dilated.   4. The mitral valve is abnormal with restricted posterior leaflet.  Moderate mitral valve regurgitation. No evidence of mitral stenosis.   5. Tricuspid valve regurgitation is mild to moderate.   6. The aortic valve is tricuspid. Aortic valve regurgitation is not  visualized. No aortic stenosis is present.   7. The inferior vena cava is normal in size with <50% respiratory  variability, suggesting right atrial pressure of 8 mmHg.   8. The patient was in atrial fibrillation.    Antimicrobials:  Anti-infectives (From admission, onward)    Start     Dose/Rate Route Frequency Ordered Stop   08/14/22 1915  ampicillin-sulbactam (UNASYN) 1.5 g in sodium chloride 0.9 % 100 mL IVPB        1.5 g 200 mL/hr over 30 Minutes Intravenous Every 12 hours 08/14/22 1817 08/19/22 1929   08/13/22 1923  vancomycin variable dose per unstable renal function (pharmacist dosing)  Status:  Discontinued         Does not apply See admin instructions 08/13/22 1924 08/13/22 2105   08/13/22 1830  vancomycin (VANCOREADY) IVPB 1250 mg/250 mL        1,250 mg 166.7 mL/hr over 90 Minutes Intravenous  Once 08/13/22 1828 08/13/22 2040   08/13/22 1815  ceFEPIme (MAXIPIME) 2 g in sodium chloride 0.9 % 100 mL IVPB        2 g 200 mL/hr over 30 Minutes Intravenous  Once 08/13/22 1804 08/13/22 1844       Objective: Vitals:   08/20/22 2300 08/21/22 0100 08/21/22 0300 08/21/22 0415  BP: (!) 93/51 99/62 (!) 98/58   Pulse: 97  (!) 108   Resp: 14  18   Temp: 97.7 F (36.5 C)  97.8 F (36.6 C)   TempSrc: Axillary  Axillary   SpO2: 100%  100%   Weight:    70.2 kg  Height:        Intake/Output Summary (Last 24 hours) at 08/21/2022 0529 Last data filed at 08/21/2022 0500 Gross per  24 hour  Intake 480 ml  Output 1351 ml  Net -871 ml   Filed Weights   08/19/22 0428 08/20/22 0500 08/21/22 0415  Weight: 73.8 kg 72.9 kg 70.2 kg    Examination:  General: AAOx3, uncomfortable appearing Cardiovascular: tachycardic Lungs: few bibasilar crackles, Abdomen: Soft, nontender, nondistended Neurological: Alert. Moves all extremities 4 . Cranial nerves II through XII grossly intact. Extremities: trace edema   Data Reviewed: I have personally reviewed following labs and imaging studies  CBC: Recent Labs  Lab 08/17/22 0027 08/18/22 0021 08/19/22 0017 08/20/22 0734 08/21/22 0027  WBC 8.0 4.4 5.3 6.4 6.5  NEUTROABS 5.6 2.5 4.4 4.5 4.6  HGB 8.6* 8.5* 8.6* 9.5* 9.7*  HCT 27.5* 27.3* 28.3* 30.2* 29.9*  MCV 87.0 85.8 87.9 85.8 85.4  PLT 157 145* 170 180 196    Basic Metabolic Panel: Recent Labs  Lab 08/16/22 0023 08/17/22 0027 08/18/22 0021 08/19/22 0017 08/20/22 0734 08/21/22 0027  NA 138 135 134* 132* 135 134*  K 4.7 4.2 4.2 4.5 4.3 4.5  CL 104 102 102 101 99 99  CO2 16* '22 25 25 27 26  '$ GLUCOSE 112* 135* 81 145* 136* 125*  BUN 49* 50* 51* 46* 42* 38*  CREATININE 3.04* 3.17*  3.19* 2.55* 2.05* 1.84*  CALCIUM 8.4* 8.1* 8.1* 8.2* 8.5* 8.6*  MG 2.9* 2.7* 2.8* 2.6*  --  2.3  PHOS 4.9* 4.8* 5.0* 4.6  --  3.1    GFR: Estimated Creatinine Clearance: 18.8 mL/min (A) (by C-G formula based on SCr of 1.84 mg/dL (H)).  Liver Function Tests: Recent Labs  Lab 08/17/22 0027 08/18/22 0021 08/19/22 0017 08/20/22 0734 08/21/22 0027  AST 10* 9* 12* 11* 10*  ALT '10 10 10 13 12  '$ ALKPHOS 45 49 47 50 48  BILITOT 0.5 0.4 0.8 0.4 0.6  PROT 5.4* 5.4* 6.1* 6.4* 5.9*  ALBUMIN 2.6* 2.5* 2.7* 3.0* 2.9*    CBG: Recent Labs  Lab 08/19/22 2122 08/20/22 0605 08/20/22 1109 08/20/22 1626 08/20/22 2118  GLUCAP 153* 94 194* 149* 124*     Recent Results (from the past 240 hour(s))  Culture, blood (Routine x 2)     Status: None   Collection Time: 08/13/22  5:40 PM    Specimen: BLOOD  Result Value Ref Range Status   Specimen Description BLOOD RIGHT ANTECUBITAL  Final   Special Requests   Final    BOTTLES DRAWN AEROBIC AND ANAEROBIC Blood Culture adequate volume   Culture   Final    NO GROWTH 5 DAYS Performed at [facility] [address] [place], [place] [address]    Report Status 08/18/2022 FINAL  Final  Culture, blood (Routine x 2)     Status: None   Collection Time: 08/13/22  5:50 PM   Specimen: BLOOD RIGHT HAND  Result Value Ref Range Status   Specimen Description BLOOD RIGHT HAND  Final   Special Requests   Final    BOTTLES DRAWN AEROBIC AND ANAEROBIC Blood Culture results may not be optimal due to an inadequate volume of blood received in culture bottles   Culture   Final    NO GROWTH 5 DAYS Performed at [facility], [place] [address] [place], [place] [address]    Report Status 08/18/2022 FINAL  Final  MRSA Next Gen by PCR, Nasal     Status: None   Collection Time: 08/13/22  6:29 PM   Specimen: Nasal Mucosa; Nasal Swab  Result Value Ref Range Status   MRSA by PCR Next Gen NOT DETECTED NOT DETECTED Final    Comment: (NOTE) The GeneXpert MRSA Assay (FDA approved for NASAL specimens only), is one component of a comprehensive MRSA colonization surveillance program. It is not intended to diagnose MRSA infection nor to guide or monitor treatment for MRSA infections. Test performance is not FDA approved in patients less than 61 years old. Performed at [facility], [place] [address] [place], [place] [address]   Respiratory (~20 pathogens) panel by PCR     Status: None   Collection Time: 08/19/22 10:10 AM   Specimen: Nasopharyngeal Swab; Respiratory  Result Value Ref Range Status   Adenovirus NOT DETECTED NOT DETECTED Final   Coronavirus 229E NOT DETECTED NOT DETECTED Final    Comment: (NOTE) The Coronavirus on the Respiratory Panel, DOES NOT test for the novel  Coronavirus (2019 nCoV)    Coronavirus HKU1 NOT  DETECTED NOT DETECTED Final   Coronavirus NL63 NOT DETECTED NOT DETECTED Final   Coronavirus OC43 NOT DETECTED NOT DETECTED Final   Metapneumovirus NOT DETECTED NOT DETECTED Final   Rhinovirus / Enterovirus NOT DETECTED NOT DETECTED Final   Influenza A NOT DETECTED NOT DETECTED Final   Influenza B NOT DETECTED NOT DETECTED  Final   Parainfluenza Virus 1 NOT DETECTED NOT DETECTED Final   Parainfluenza Virus 2 NOT DETECTED NOT DETECTED Final   Parainfluenza Virus 3 NOT DETECTED NOT DETECTED Final   Parainfluenza Virus 4 NOT DETECTED NOT DETECTED Final   Respiratory Syncytial Virus NOT DETECTED NOT DETECTED Final   Bordetella pertussis NOT DETECTED NOT DETECTED Final   Bordetella Parapertussis NOT DETECTED NOT DETECTED Final   Chlamydophila pneumoniae NOT DETECTED NOT DETECTED Final   Mycoplasma pneumoniae NOT DETECTED NOT DETECTED Final    Comment: Performed at [facility], [place] [address], [place], [place] [address]  SARS Coronavirus 2 by RT PCR (hospital order, performed in [facility] hospital lab) *cepheid single result test*     Status: None   Collection Time: 08/19/22 10:10 AM  Result Value Ref Range Status   SARS Coronavirus 2 by RT PCR NEGATIVE NEGATIVE Final    Comment: (NOTE) SARS-CoV-2 target nucleic acids are NOT DETECTED.  The SARS-CoV-2 RNA is generally detectable in upper and lower respiratory specimens during the acute phase of infection. The lowest concentration of SARS-CoV-2 viral copies this assay can detect is 250 copies / mL. A negative result does not preclude SARS-CoV-2 infection and should not be used as the sole basis for treatment or other patient management decisions.  A negative result may occur with improper specimen collection / handling, submission of specimen other than nasopharyngeal swab, presence of viral mutation(s) within the areas targeted by this assay, and inadequate number of viral copies (<250 copies / mL). A negative result must be  combined with clinical observations, patient history, and epidemiological information.  Fact Sheet for Patients:   https://www.patel.info/  Fact Sheet for Healthcare Providers: https://hall.com/  This test is not yet approved or  cleared by the [place] FDA and has been authorized for detection and/or diagnosis of SARS-CoV-2 by FDA under an Emergency Use Authorization (EUA).  This EUA will remain in effect (meaning this test can be used) for the duration of the COVID-19 declaration under Section 564(b)(1) of the Act, 21 U.S.C. section 360bbb-3(b)(1), unless the authorization is terminated or revoked sooner.  Performed at [facility], [address], [place],  [address]          Radiology Studies: DG CHEST PORT 1 VIEW  Result Date: 08/19/2022 CLINICAL DATA:  Shortness of breath. EXAM: PORTABLE CHEST 1 VIEW COMPARISON:  Radiograph yesterday FINDINGS: Increasing multifocal lung opacities, greatest in the lung bases, but also increasing in the suprahilar regions. Stable heart size and mediastinal contours. Suspect developing pleural effusions. No pneumothorax. IMPRESSION: 1. Increasing multifocal lung opacities, greatest in the lung bases, but also increasing in the suprahilar regions. Findings suspicious for worsening multifocal pneumonia. 2. Suspect developing pleural effusions. Electronically Signed   By: Keith Rake M.D.   On: 08/19/2022 17:30        Scheduled Meds:  amiodarone  400 mg Oral BID   apixaban  2.5 mg Oral BID   calcium carbonate  1 tablet Oral BID   Chlorhexidine Gluconate Cloth  6 each Topical Q0600   FLUoxetine  20 mg Oral Q breakfast   insulin aspart  0-9 Units Subcutaneous TID WC   insulin detemir  9 Units Subcutaneous Daily   levothyroxine  50 mcg Oral QAC breakfast   midodrine  5 mg Oral TID WC   polyethylene glycol  17 g Oral BID   pramipexole  0.5 mg Oral QHS   QUEtiapine  100 mg Oral  QHS   senna-docusate  1 tablet Oral QHS   sodium chloride flush  3 mL Intravenous Q12H   Continuous Infusions:     LOS: 8 days    Time spent: 40 min    Domenic Polite, MD [facility]   To contact the attending provider between 7A-7P or the covering provider during after hours 7P-7A, please log into the web site [email or web address] and access using universal [place] password for that web site. If you do not have the password, please call the hospital operator.  08/21/2022, 5:29 AM

## 2022-08-21 NOTE — Progress Notes (Signed)
Physical Therapy Treatment Patient Details Name: Melissa [name] MRN: [id number] DOB: 1928-05-01 Today's Date: 08/21/2022   History of Present Illness Melissa [name] is a 88 y.o. female who was admitted on 08/13/22 with paroxysmal atrial fibrillation with RVR. Course complicated by concern for aspiration PNA, HF exacerbation requiring BiPAP 11/6. PMH: HFrEF (EF 20-25% per TTE 04/13/2021), NICM, PAF on Eliquis, CKD stage IIIb, insulin-dependent T2DM, HTN, hypothyroidism, HLD, anxiety, delusional parasitosis, mild cognitive impairment    PT Comments    Pt slowly progressing with mobility, session limited by intense abdominal pain. Today's session focused on sitting balance and room ambulation to transfer to chair. Pt ambulated 88f min guard-minA, required both verbal and tactile cues to negotiate RW around obstacles. Pt remains limited by generalized weakness, decreased activity tolerance, cognitive deficits, and impaired balance strategies/postural reactions. Continue to recommend acute PT services to maximize functional mobility and independence prior to d/c to HHPT (family requests pt to return home with therapy and additional help hired as needed).   HR 107-126 bpm during mobility   Recommendations for follow up therapy are one component of a multi-disciplinary discharge planning process, led by the attending physician.  Recommendations may be updated based on patient status, additional functional criteria and insurance authorization.  Follow Up Recommendations  Home health PT     Assistance Recommended at Discharge Frequent or constant Supervision/Assistance  Patient can return home with the following A little help with walking and/or transfers;A little help with bathing/dressing/bathroom;Direct supervision/assist for medications management;Assist for transportation;Help with stairs or ramp for entrance   Equipment Recommendations  Other (comment) (TBD)    Recommendations for Other Services        Precautions / Restrictions Precautions Precautions: Fall Precaution Comments: on 3L O2 baseline, abdominal pain Restrictions Weight Bearing Restrictions: No     Mobility  Bed Mobility Overal bed mobility: Needs Assistance Bed Mobility: Supine to Sit     Supine to sit: HOB elevated, Supervision     General bed mobility comments: pt able to perform supine > sit supevision with occassional verbal cue for hand placement. Demonstrated scooting hips in recliner and bed with no LOB during weight shift    Transfers Overall transfer level: Needs assistance Equipment used: Rolling walker (2 wheels) Transfers: Sit to/from Stand Sit to Stand: Min guard, From elevated surface           General transfer comment: pt required cues for where to place hands on RW once standing, also required verbal cues for LE placement due to feet not touching floor before attempt to stand    Ambulation/Gait Ambulation/Gait assistance: Min assist, Min guard Gait Distance (Feet): 20 Feet Assistive device: Rolling walker (2 wheels) Gait Pattern/deviations: Step-through pattern, Decreased stride length, Trunk flexed Gait velocity: decreased     General Gait Details: gait around bed to recliner min guard to minA for guidance and verbal cues of RW placement   Stairs             Wheelchair Mobility    Modified Rankin (Stroke Patients Only)       Balance Overall balance assessment: Needs assistance Sitting-balance support: Single extremity supported, Feet unsupported, No upper extremity supported Sitting balance-Leahy Scale: Fair Sitting balance - Comments: prolonged sitting EOB when pt was nauseous, would intermittently fall to R side and would place UE on handrail to support herself   Standing balance support: Bilateral upper extremity supported, During functional activity, Reliant on assistive device for balance Standing balance-Leahy Scale: Poor Standing balance  comment: pt  reliant on RW for balance                            Cognition Arousal/Alertness: Awake/alert Behavior During Therapy: Flat affect Overall Cognitive Status: History of cognitive impairments - at baseline                                 General Comments: pt oriented to person and place, was unable to recall year beyond 2000. required frequent redirection and multiple cues to complete tasks during session        Exercises      General Comments General comments (skin integrity, edema, etc.): SpO2 stable on 3L, HR range from 107-126 bpm during bed mobility and ambulation      Pertinent Vitals/Pain Pain Assessment Faces Pain Scale: Hurts whole lot Pain Location: abdomen Pain Descriptors / Indicators: Aching, Moaning Pain Intervention(s): Monitored during session, Patient requesting pain meds-RN notified    Home Living                          Prior Function            PT Goals (current goals can now be found in the care plan section) Acute Rehab PT Goals Patient Stated Goal: to have less stomach pain PT Goal Formulation: With patient Time For Goal Achievement: 09/04/22 Potential to Achieve Goals: Fair Progress towards PT goals: Progressing toward goals    Frequency    Min 3X/week      PT Plan Current plan remains appropriate    Co-evaluation              AM-PAC PT "6 Clicks" Mobility   Outcome Measure  Help needed turning from your back to your side while in a flat bed without using bedrails?: A Little Help needed moving from lying on your back to sitting on the side of a flat bed without using bedrails?: A Little Help needed moving to and from a bed to a chair (including a wheelchair)?: A Little Help needed standing up from a chair using your arms (e.g., wheelchair or bedside chair)?: A Little Help needed to walk in hospital room?: A Little Help needed climbing 3-5 steps with a railing? : A Lot 6 Click Score: 17     End of Session Equipment Utilized During Treatment: Gait belt Activity Tolerance: Patient limited by pain Patient left: in chair;with call bell/phone within reach;with chair alarm set Nurse Communication: Mobility status;Patient requests pain meds PT Visit Diagnosis: Unsteadiness on feet (R26.81);Muscle weakness (generalized) (M62.81);Difficulty in walking, not elsewhere classified (R26.2)     Time: 5427-0623 PT Time Calculation (min) (ACUTE ONLY): 26 min  Charges:  $Therapeutic Activity: 23-37 mins                    Chipper Oman, SPT    Melissa [name] 08/21/2022, 10:49 AM

## 2022-08-21 NOTE — Consult Note (Signed)
Consultation Note Date: 08/21/2022 at 1445  Patient Name: Melissa [name]  DOB: 27-Oct-1927  MRN: [id number]  Age / Sex: 88 y.o., female  PCP: [name], Melissa Filler, MD Referring Physician: Domenic Polite, MD  Reason for Consultation: Establishing goals of care  HPI/Patient Profile: 88 y.o. female  with past medical history of heart failure (EF 20 to 25% per TTE on 04/13/2021), NICM, PAF (on Eliquis), CKD (stage IIIb), insulin-dependent type 2 diabetic, HTN, hypothyroidism, HLD, anxiety, delusional parasitosis, mild cognitive impairment (Lewy body dementia?)  admitted from home on 08/13/2022 with 3 to 4 days of fatigue with exertional dyspnea, palpitations, and chest discomfort.  Patient also endorsed intermittent cough that was productive of white sputum.  Patient is being treated for multifocal pneumonia (status post 5 days of Unasyn for possible aspiration), A-fib with RVR (IV amiodarone) and CHF (IV Lasix, Foley).  Hospital course has been complicated by hypotension (now on midodrine) as well as respiratory distress and crackles requiring BiPAP on 11/6.  SLP evaluated patient.  She is at mild risk for aspiration and a dysphagia 3 with thin liquid diet was recommended.  PMT was consulted to discuss goals of care.  Of note, patient is an established and active patient with [facility].  Clinical Assessment and Goals of Care: I have reviewed medical records including EPIC notes, labs and imaging, assessed the patient and then met with patient and her daughter Melissa [name] at bedside to discuss diagnosis prognosis, [place], EOL wishes, disposition and options.  I introduced Palliative Medicine as specialized medical care for people living with serious illness. It focuses on providing relief from the symptoms and stress of a serious illness. The goal is to improve quality of life for both the patient and the  family.  We discussed a brief life review of the patient.  The patient is a widow and has 2 living children (1 child died in [place]). Pt has been living with Melissa [name] and caring for her grandson and grandchildren since pt's husband passed in the late 86s. 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   As far as functional and nutritional status Melissa [name] has seen a decline in her mother over the past several weeks. She endorses waxing/waning orientation with delirium/confusion.   We discussed patient's current illness and what it means in the larger context of patient's on-going co-morbidities. I attempted to elicit values and goals of care important to the patient.  Melissa [name] does not want her mother to suffer with any medical interventions. She would like to see what can be done to control her mother's heart rate and then see what, if any, mobility patient has to work with PT. Melissa [name] is concerned about being the sole caregiver for her mother and not being able to transfer her/move her once at home.   Code status discussed. Melissa [name] says she understands that her mother's heart is weak and that performing CPR may cause great pain and "do more harm than good". However,  she is undecided about changing her code status and would like more time to consider it.    I highlighted that a DNR conveys that in the event that the patient's heart and lung stops then a CODE BLUE would be called, CPR will be performed, and likely defibrillation with use of IV medications would be utilized simultaneously with ambu bags and ventilators to support patient's heart and lungs.   We discussed that while patient's heart and lungs may need to be supported, restarted, and placed on a mechanical ventilator for life support, that none of these efforts would reverse patient's underlying conditions.    Full code remains.   Family is facing treatment option decisions and anticipatory care needs.     Discussed with Melissa [name] the importance of continued conversation with family and the medical providers regarding overall plan of care and treatment options, ensuring decisions are within the context of the patient's values and GOCs.    Pt c/o abdominal pain. Asssessment of abdomen revealed pain with palpation of bladder, which is not distended or displaced. Foley draining clear, yellow urine.   DIscussed use of Tylenol for pain.  DIscussed bladder pressure pain with RN and advised physical manipulation of foley with deflation and reinflation of balloon in hopes of relieving patient's discomfort.   Questions and concerns were addressed. Melissa [name] was encouraged to call with questions or concerns.   PMT will continue to follow the patient throughout her hospitalization.   Primary Decision Maker NEXT OF KIN  Physical Exam Vitals reviewed.  Constitutional:      General: She is not in acute distress.    Appearance: Normal appearance. She is not ill-appearing.  HENT:     Head: Normocephalic.     Mouth/Throat:     Mouth: Mucous membranes are moist.  Eyes:     Pupils: Pupils are equal, round, and reactive to light.  Cardiovascular:     Rate and Rhythm: Tachycardia present. Rhythm irregular.     Pulses: Normal pulses.  Pulmonary:     Effort: Pulmonary effort is normal.  Abdominal:     Palpations: Abdomen is soft.     Comments: Tenderness with palpation of bladder, bladder not displaced or distended on palpation  Musculoskeletal:     Comments: Generalized weakness  Skin:    General: Skin is warm and dry.  Neurological:     Mental Status: She is alert.     Comments: Oriented to self     Palliative Assessment/Data: 40%     Thank you for this consult. Palliative medicine will continue to follow and assist holistically.   Time Total: 75 minutes Greater than 50%  of this time was spent counseling and coordinating care related to the above assessment and plan.  Signed by: Melissa Hawks, DNP, FNP-BC Palliative Medicine    Please contact Palliative Medicine Team phone at [phone] for questions and concerns.  For individual provider: See Shea Evans

## 2022-08-21 NOTE — Progress Notes (Signed)
Mobility Specialist Progress Note    08/21/22 1546  Mobility  Activity Contraindicated/medical hold   RN advised. Will f/u as appropriate.   Hildred Alamin Mobility Specialist  Please Psychologist, sport and exercise or Rehab Office at [phone]

## 2022-08-22 DIAGNOSIS — Z515 Encounter for palliative care: Secondary | ICD-10-CM | POA: Diagnosis not present

## 2022-08-22 DIAGNOSIS — I5043 Acute on chronic combined systolic (congestive) and diastolic (congestive) heart failure: Secondary | ICD-10-CM | POA: Diagnosis not present

## 2022-08-22 DIAGNOSIS — I5022 Chronic systolic (congestive) heart failure: Secondary | ICD-10-CM | POA: Diagnosis not present

## 2022-08-22 DIAGNOSIS — I480 Paroxysmal atrial fibrillation: Secondary | ICD-10-CM | POA: Diagnosis not present

## 2022-08-22 LAB — COMPREHENSIVE METABOLIC PANEL
ALT: 13 U/L (ref 0–44)
AST: 13 U/L — ABNORMAL LOW (ref 15–41)
Albumin: 3.1 g/dL — ABNORMAL LOW (ref 3.5–5.0)
Alkaline Phosphatase: 52 U/L (ref 38–126)
Anion gap: 6 (ref 5–15)
BUN: 34 mg/dL — ABNORMAL HIGH (ref 8–23)
CO2: 30 mmol/L (ref 22–32)
Calcium: 8.9 mg/dL (ref 8.9–10.3)
Chloride: 95 mmol/L — ABNORMAL LOW (ref 98–111)
Creatinine, Ser: 1.93 mg/dL — ABNORMAL HIGH (ref 0.44–1.00)
GFR, Estimated: 24 mL/min — ABNORMAL LOW (ref >60)
Glucose, Bld: 132 mg/dL — ABNORMAL HIGH (ref 70–99)
Potassium: 4.6 mmol/L (ref 3.5–5.1)
Sodium: 131 mmol/L — ABNORMAL LOW (ref 135–145)
Total Bilirubin: 0.4 mg/dL (ref 0.3–1.2)
Total Protein: 6.3 g/dL — ABNORMAL LOW (ref 6.5–8.1)

## 2022-08-22 LAB — CBC
HCT: 33.2 % — ABNORMAL LOW (ref 36.0–46.0)
Hemoglobin: 10.3 g/dL — ABNORMAL LOW (ref 12.0–15.0)
MCH: 26.9 pg (ref 26.0–34.0)
MCHC: 31 g/dL (ref 30.0–36.0)
MCV: 86.7 fL (ref 80.0–100.0)
Platelets: 172 10*3/uL (ref 150–400)
RBC: 3.83 MIL/uL — ABNORMAL LOW (ref 3.87–5.11)
RDW: 17.6 % — ABNORMAL HIGH (ref 11.5–15.5)
WBC: 6 10*3/uL (ref 4.0–10.5)
nRBC: 0 % (ref 0.0–0.2)

## 2022-08-22 LAB — BLOOD GAS, ARTERIAL
Acid-Base Excess: 7 mmol/L — ABNORMAL HIGH (ref 0.0–2.0)
Bicarbonate: 34.5 mmol/L — ABNORMAL HIGH (ref 20.0–28.0)
O2 Saturation: 98.8 %
Patient temperature: 36.4
pCO2 arterial: 59 mmHg — ABNORMAL HIGH (ref 32–48)
pH, Arterial: 7.37 (ref 7.35–7.45)
pO2, Arterial: 99 mmHg (ref 83–108)

## 2022-08-22 LAB — GLUCOSE, CAPILLARY
Glucose-Capillary: 134 mg/dL — ABNORMAL HIGH (ref 70–99)
Glucose-Capillary: 193 mg/dL — ABNORMAL HIGH (ref 70–99)
Glucose-Capillary: 195 mg/dL — ABNORMAL HIGH (ref 70–99)
Glucose-Capillary: 126 mg/dL — ABNORMAL HIGH (ref 70–99)

## 2022-08-22 MED ORDER — IPRATROPIUM-ALBUTEROL 0.5-2.5 (3) MG/3ML IN SOLN
3.0000 mL | Freq: Two times a day (BID) | RESPIRATORY_TRACT | Status: DC
  Administered 2022-08-22 – 2022-08-25 (×6): 3 mL via RESPIRATORY_TRACT
  Filled 2022-08-22 (×6): qty 3

## 2022-08-22 NOTE — Progress Notes (Signed)
Rounding Note    Patient Name: Melissa [name] Date of Encounter: 08/22/2022  [place] Cardiologist: [name], MD   Subjective   Rates better on IV amiodarone, but still around 100. Continues to have abd pain  Inpatient Medications    Scheduled Meds:  apixaban  2.5 mg Oral BID   calcium carbonate  1 tablet Oral BID   Chlorhexidine Gluconate Cloth  6 each Topical Q0600   FLUoxetine  20 mg Oral Q breakfast   insulin aspart  0-9 Units Subcutaneous TID WC   insulin detemir  9 Units Subcutaneous Daily   ipratropium-albuterol  3 mL Nebulization BID   levothyroxine  50 mcg Oral QAC breakfast   midodrine  5 mg Oral TID WC    morphine injection  1 mg Intravenous Once   polyethylene glycol  17 g Oral BID   pramipexole  0.5 mg Oral QHS   QUEtiapine  100 mg Oral QHS   senna-docusate  1 tablet Oral QHS   sodium chloride flush  3 mL Intravenous Q12H   Continuous Infusions:  amiodarone 30 mg/hr (08/22/22 0745)    PRN Meds: acetaminophen **OR** acetaminophen, guaiFENesin-dextromethorphan, morphine injection, ondansetron **OR** ondansetron (ZOFRAN) IV, mouth rinse   Vital Signs    Vitals:   08/22/22 0808 08/22/22 0945 08/22/22 1051 08/22/22 1218  BP: 133/71  129/63   Pulse: (!) 102  (!) 105   Resp: 18  20   Temp: 97.9 F (36.6 C)  97.7 F (36.5 C)   TempSrc: Oral  Oral   SpO2: 100% 100% 100% 100%  Weight:      Height:        Intake/Output Summary (Last 24 hours) at 08/22/2022 1524 Last data filed at 08/22/2022 1352 Gross per 24 hour  Intake --  Output 950 ml  Net -950 ml      08/22/2022    3:00 AM 08/21/2022    4:15 AM 08/20/2022    5:00 AM  Last 3 Weights  Weight (lbs) 151 lb 7.3 oz 154 lb 12.2 oz 160 lb 11.5 oz  Weight (kg) 68.7 kg 70.2 kg 72.9 kg      Telemetry    Afib 100- Personally Reviewed  ECG    Afib - Personally Reviewed  Physical Exam   GEN: No acute distress Neck: No JVD Cardiac: irregular and tachycardic Respiratory:  crackles in bases GI: Soft, TTP LLQ, non-distended  MS: no edema; No deformity. Cold on exam Neuro:  Nonfocal  Psych: Normal affect, not oriented  Labs    High Sensitivity Troponin:   Recent Labs  Lab 08/13/22 1957 08/16/22 1535 08/16/22 1806  TROPONINIHS '12 14 13     '$ Chemistry Recent Labs  Lab 08/18/22 0021 08/19/22 0017 08/20/22 0734 08/21/22 0027 08/22/22 0029  NA 134* 132* 135 134* 131*  K 4.2 4.5 4.3 4.5 4.6  CL 102 101 99 99 95*  CO2 '25 25 27 26 30  '$ GLUCOSE 81 145* 136* 125* 132*  BUN 51* 46* 42* 38* 34*  CREATININE 3.19* 2.55* 2.05* 1.84* 1.93*  CALCIUM 8.1* 8.2* 8.5* 8.6* 8.9  MG 2.8* 2.6*  --  2.3  --   PROT 5.4* 6.1* 6.4* 5.9* 6.3*  ALBUMIN 2.5* 2.7* 3.0* 2.9* 3.1*  AST 9* 12* 11* 10* 13*  ALT '10 10 13 12 13  '$ ALKPHOS 49 47 50 48 52  BILITOT 0.4 0.8 0.4 0.6 0.4  GFRNONAA 13* 17* 22* 25* 24*  ANIONGAP '7 6 9 '$ 9  6    Lipids No results for input(s): "CHOL", "TRIG", "HDL", "LABVLDL", "LDLCALC", "CHOLHDL" in the last 168 hours.  Hematology Recent Labs  Lab 08/20/22 0734 08/21/22 0027 08/22/22 0029  WBC 6.4 6.5 6.0  RBC 3.52* 3.50* 3.83*  HGB 9.5* 9.7* 10.3*  HCT 30.2* 29.9* 33.2*  MCV 85.8 85.4 86.7  MCH 27.0 27.7 26.9  MCHC 31.5 32.4 31.0  RDW 17.5* 17.7* 17.6*  PLT 180 186 172   Thyroid  Recent Labs  Lab 08/16/22 0023  TSH 3.352    BNP Recent Labs  Lab 08/19/22 0017  BNP 1,406.9*    DDimer No results for input(s): "DDIMER" in the last 168 hours.   Radiology    DG Abd 1 View  Result Date: 08/21/2022 CLINICAL DATA:  Abdominal pain and constipation EXAM: ABDOMEN - 1 VIEW COMPARISON:  CT 12/31/2021 FINDINGS: Cholecystectomy clips in the right upper quadrant. No dilated small or large bowel. Overall amount of fecal matter within the colon appears within normal limits. No significant bone finding. IMPRESSION: No acute finding. Overall amount of fecal matter within the colon appears within normal limits. Electronically Signed   By: Nelson Chimes M.D.   On: 08/21/2022 10:14    Cardiac Studies   Echocardiogram 08/14/22 1. Left ventricular ejection fraction, by estimation, is 25%. The left  ventricle has severely decreased function. The left ventricle demonstrates  global hypokinesis. Left ventricular diastolic parameters are  indeterminate.   2. Right ventricular systolic function is moderately reduced. The right  ventricular size is normal. There is normal pulmonary artery systolic  pressure. The estimated right ventricular systolic pressure is 87.8 mmHg.   3. Left atrial size was moderately dilated.   4. The mitral valve is abnormal with restricted posterior leaflet.  Moderate mitral valve regurgitation. No evidence of mitral stenosis.   5. Tricuspid valve regurgitation is mild to moderate.   6. The aortic valve is tricuspid. Aortic valve regurgitation is not  visualized. No aortic stenosis is present.   7. The inferior vena cava is normal in size with <50% respiratory  variability, suggesting right atrial pressure of 8 mmHg.   8. The patient was in atrial fibrillation.   Patient Profile     88 y.o. female with a hx of atrial fib followed by [name], last seen 08/2021 also EF 20-25% NICM, delusional disorder, DM-2, GERD now admitted with sepsis, cardiology consulted for management of paroxysmal afib. Had brief sinus rhythm in hospital but back in afib with RVR and hypotension. Note patient was admitted in 05/2021 with pneumonia, found to have A-fib with RVR.  She was started on Eliquis 2.5 twice daily, underwent cardioversion in 05/2021.  More recently admitted in 06/2022 with pneumonia, maintained normal sinus rhythm that admission. Now presented with shortness of breath. Found to be in afib with RVR  Assessment & Plan    Paroxysmal Atrial Fibrillation  - Treated with IV amiodarone and converted to NSR and then had recurrence of Afib.  - amio load complete, doing slightly better on IV amio, will continue since oral was  less effective. - consider IV amio 150 mg bolus administered slowly if further rate control needed. - Home metoprolol held due to soft BP while in Afib. BP is better with better rate control and addition of midodrine per neph. - Consider adding low dose metoprolol for rate control as needed, challenging with low BP - Continue Eliquis 2.5 mg twice daily (dose reduced due to age, renal function) - Echo  this admission with EF 25%, moderately reduced RV function, moderately dilated LA, moderate mitral valve regurgitation  - Maintain K>4, mag>2    Chronic systolic heart failure - Most recent echocardiogram completed 04/2021 showed EF 20-25%, global hypokinesis, normal RV systolic function - Echo this admission with EF 25%, moderately reduced RV systolic function, moderate mitral valve regurgitation (restricted posterior leaflet)  -Home metoprolol held due to soft BP.  Not on ACE/ARB/ARNI or Arlyce Harman given CKD - She is cold on exam today, and we are having difficulty achieving rate control due to hypotension. I do not think she would tolerate sedation for elective cardioversion.  - she is at what is likely her dry weight, 68.7kg today, 1 year ago in office weight was 71.2 kg.  - recommend goals of care discussion.    Hypotension - BP soft this admission  - Holding home BP medications  - Patient has received IV fluids, then with volume overload but improvement in renal function.  - Suspect BP will improve with HR control, has improved with midodrine. - midodrine added by nephrology. Maintaining BP.   AKI on CKD stage IIIb - baseline approximately 1.3 -Creatinine further increased to 3, improved with hydration and subsequent lasix. Cr 1.93 today. - Holding home BP medications to avoid hypotension. - possible poor PO intake is contributing.   Very challenging situation given patient's delirium, abdominal pain, hypotension and difficulty with rate control. Not many good options. [place] discussion started.     For questions or updates, please contact [place] Please consult [email or web address] for contact info under        Signed, Elouise Munroe, MD  08/22/2022, 3:24 PM

## 2022-08-22 NOTE — Progress Notes (Addendum)
Low BP. Held Morphine and lasix.  Pt is asleep. Sats 100% on 3Liters. Plan of care ongoing.  Placed Pt on Bipap.

## 2022-08-22 NOTE — Progress Notes (Signed)
                                                     Palliative Care Progress Note, Assessment & Plan   Patient Name: Melissa [name]       Date: 08/22/2022 DOB: May 12, 1928  Age: 88 y.o. MRN#: [id number] Attending Physician: Domenic Polite, MD Primary Care Physician: Darreld Mclean, MD Admit Date: 08/13/2022  Reason for Consultation/Follow-up: Establishing goals of care  Subjective: Patient is out of bed and sitting in recliner with daughter at bedside.  She acknowledges my presence and is able to make her wishes known.  She denies pain in her abdomen and she had complained of yesterday.  RN at bedside shares she gave morphine approximately 30 minutes ago and patient has had no complaints since that time.  Summary of counseling/coordination of care: After reviewing the patient's chart and assessing the patient at bedside, I discussed plan of care with patient's daughter [name].  [name] is hopeful that patient will continue to recover as she appears to look better to [name] today when compared to yesterday.  Discussed that amiodarone is still in the IV form and will need to be converted to by mouth once appropriate dosage is known.  Hard Choices book given for [name]'s review.  I encouraged [name] to discuss and consider patient's CODE STATUS moving forward.  Hospice philosophy versus palliative care with aggressive medical treatment reviewed.  [name] was appreciative of palliative medicine support.  She has PMT contact information.  Goals are clear.  Treat the treatable.  Full code.  Daughter is Marine scientist.  PMT will shadow the patient's chart and monitor her peripherally.  Please reengage with PMT at patient/family's request, if goals change, or if patient's health deteriorates during hospitalization.  Full code and full scope remains.   Physical  Exam Constitutional:      Appearance: Normal appearance.  HENT:     Head: Normocephalic.     Nose: Nose normal.     Mouth/Throat:     Mouth: Mucous membranes are moist.  Eyes:     Pupils: Pupils are equal, round, and reactive to light.  Cardiovascular:     Rate and Rhythm: Tachycardia present. Rhythm irregular.     Pulses: Normal pulses.  Pulmonary:     Effort: Pulmonary effort is normal.  Abdominal:     Palpations: Abdomen is soft.  Musculoskeletal:     Comments: Generalized weakness  Skin:    General: Skin is warm and dry.  Neurological:     Mental Status: She is alert.     Comments: Oriented to self and place  Psychiatric:        Mood and Affect: Mood normal.        Behavior: Behavior normal.        Judgment: Judgment normal.             Palliative Assessment/Data: 40%    Total Time 35 minutes  Greater than 50%  of this time was spent counseling and coordinating care related to the above assessment and plan.  Thank you for allowing the Palliative Medicine Team to assist in the care of this patient.  [place] Ilsa Iha, FNP-BC Palliative Medicine Team Team Phone # [phone]

## 2022-08-22 NOTE — Progress Notes (Addendum)
PROGRESS NOTE    Melissa [name]  SWN:[id number] DOB: Mar 23, 1928 DOA: 08/13/2022 PCP: Darreld Mclean, MD  Chief Complaint  Patient presents with   Code Sepsis    Brief Narrative:  Melissa [name] is a 88 y.o. female with medical history significant for HFrEF (EF 20-25% per TTE 04/13/2021), NICM, PAF on Eliquis, CKD stage IIIb, insulin-dependent T2DM, HTN, hypothyroidism, HLD, anxiety, delusional parasitosis, mild cognitive impairment who is admitted with paroxysmal atrial fibrillation with RVR.  Seen by cardiology and started on IV amiodarone drip.  She was started on unasyn with concern for aspiration pneumonia as well.  She developed progressive acute kidney injury and renal was consulted.  Creatinine has plateau'd and is improving, but she's developed worsening CXR findings, rising weights suggestive of HF exacerbation.  Required bipap 11/6 as well as diuresis with lasix.    Subjective: Respiratory distress overnight, required IV Lasix  Assessment and Plan:  Acute on Chronic Hypoxic Respiratory failure  - on chronic supplemental O2 (3 L [place]),  - treated for PNA and CHF  -CXR with findings concerning for multifocal pna,  s/p 5 days unasyn for possible aspiration.  Required bipap 11/6 for developing resp distress, crackles,  - SLP eval -> mild aspiration risk, dysphagia 3/thin liquid diet recommended - pulm toilet, flutter valve -ambulate, wean O2  AKI on CKD 3a Acute Urinary Retention Creatinine 2.42 on admit compared to baseline around 1.3.  Suspected cardiorenal from hypoperfusion in setting of A-fib with RVR, hypotension -creatinine improving -Renal ultrasound with chronic medical renal disease -s/p indwelling foley catheter placed overnight 11/5-6.  Will attempt voiding trial tomorrow if respiratory status is stable  Acute on Chronic systolic CHF EF 93-81% by last TTE 05/15/9936.  , complicated by AFib RVR, hypotension, AKI, repeat echo-> EF 25%, global hypokinesis, moderately  reduced RVSF  -given IV lasix intermittently -Hold Toprol-XL given hypotension -GDMT on hold, now on midodrine -Cardiology following, repeat IV Lasix this morning -Palliative team following as well  Paroxysmal atrial fibrillation with rapid ventricular response ([place]) Presenting with A-fib with RVR, rate 140s.  Blood pressure soft.  Cardiology consulted and started IV amiodarone drip -Cardiology following, appreciate assistance - transitioned to PO amiodarone -Hold Toprol-XL with soft blood pressure -Continue Eliquis 2.5 mg twice daily  Delirium Suspect this is all due to the acute hospitalization, SOB, hypoxic respiratory failure, improving Delirium precautions -Continue seroquel  Abd pain, constipation - having BMs continue laxatives -LFTs ok, unremarkable KUB and lipase -has a foley for retention/AKi, check UA if develops fever or leukocytosis, attempt voiding trial tomorrow  Type 2 diabetes mellitus  -CBGs stable, continue current dose of Insulin  Hypothyroidism, acquired Continue home dose Synthroid 50 mcg daily.  Check TSH (3.5 wnl).   Hypomagnesemia Replace and follow  Anemia Suspect component of dilution, trend Anemia labs  DVT prophylaxis: eliquis Code Status: full Family Communication: None at bedside, will update daughter Disposition: May need rehab   Consultants:  cardiology  Procedures:  Echo IMPRESSIONS     1. Left ventricular ejection fraction, by estimation, is 25%. The left  ventricle has severely decreased function. The left ventricle demonstrates  global hypokinesis. Left ventricular diastolic parameters are  indeterminate.   2. Right ventricular systolic function is moderately reduced. The right  ventricular size is normal. There is normal pulmonary artery systolic  pressure. The estimated right ventricular systolic pressure is 16.9 mmHg.   3. Left atrial size was moderately dilated.   4. The mitral valve is abnormal with  restricted  posterior leaflet.  Moderate mitral valve regurgitation. No evidence of mitral stenosis.   5. Tricuspid valve regurgitation is mild to moderate.   6. The aortic valve is tricuspid. Aortic valve regurgitation is not  visualized. No aortic stenosis is present.   7. The inferior vena cava is normal in size with <50% respiratory  variability, suggesting right atrial pressure of 8 mmHg.   8. The patient was in atrial fibrillation.    Antimicrobials:  Anti-infectives (From admission, onward)    Start     Dose/Rate Route Frequency Ordered Stop   08/14/22 1915  ampicillin-sulbactam (UNASYN) 1.5 g in sodium chloride 0.9 % 100 mL IVPB        1.5 g 200 mL/hr over 30 Minutes Intravenous Every 12 hours 08/14/22 1817 08/19/22 1929   08/13/22 1923  vancomycin variable dose per unstable renal function (pharmacist dosing)  Status:  Discontinued         Does not apply See admin instructions 08/13/22 1924 08/13/22 2105   08/13/22 1830  vancomycin (VANCOREADY) IVPB 1250 mg/250 mL        1,250 mg 166.7 mL/hr over 90 Minutes Intravenous  Once 08/13/22 1828 08/13/22 2040   08/13/22 1815  ceFEPIme (MAXIPIME) 2 g in sodium chloride 0.9 % 100 mL IVPB        2 g 200 mL/hr over 30 Minutes Intravenous  Once 08/13/22 1804 08/13/22 1844       Objective: Vitals:   08/22/22 0300 08/22/22 0400 08/22/22 0808 08/22/22 0945  BP:  128/86 133/71   Pulse:  98 (!) 102   Resp:  14 18   Temp: (!) 97.5 F (36.4 C)  97.9 F (36.6 C)   TempSrc: Axillary  Oral   SpO2:   100% 100%  Weight: 68.7 kg     Height:        Intake/Output Summary (Last 24 hours) at 08/22/2022 1013 Last data filed at 08/22/2022 1950 Gross per 24 hour  Intake --  Output 450 ml  Net -450 ml   Filed Weights   08/20/22 0500 08/21/22 0415 08/22/22 0300  Weight: 72.9 kg 70.2 kg 68.7 kg    Examination:  General: Elderly chronically ill female sitting up in bed, AAO x3, BiPAP on HEENT: Positive JVD CVS: S1-S2, regular rhythm,  tachycardic Lungs: Few basilar rales Abdomen: Soft, nontender, nondistended, bowel sounds present Extremities: Trace edema   Data Reviewed: I have personally reviewed following labs and imaging studies  CBC: Recent Labs  Lab 08/17/22 0027 08/18/22 0021 08/19/22 0017 08/20/22 0734 08/21/22 0027 08/22/22 0029  WBC 8.0 4.4 5.3 6.4 6.5 6.0  NEUTROABS 5.6 2.5 4.4 4.5 4.6  --   HGB 8.6* 8.5* 8.6* 9.5* 9.7* 10.3*  HCT 27.5* 27.3* 28.3* 30.2* 29.9* 33.2*  MCV 87.0 85.8 87.9 85.8 85.4 86.7  PLT 157 145* 170 180 186 932    Basic Metabolic Panel: Recent Labs  Lab 08/16/22 0023 08/17/22 0027 08/18/22 0021 08/19/22 0017 08/20/22 0734 08/21/22 0027 08/22/22 0029  NA 138 135 134* 132* 135 134* 131*  K 4.7 4.2 4.2 4.5 4.3 4.5 4.6  CL 104 102 102 101 99 99 95*  CO2 16* '22 25 25 27 26 30  '$ GLUCOSE 112* 135* 81 145* 136* 125* 132*  BUN 49* 50* 51* 46* 42* 38* 34*  CREATININE 3.04* 3.17* 3.19* 2.55* 2.05* 1.84* 1.93*  CALCIUM 8.4* 8.1* 8.1* 8.2* 8.5* 8.6* 8.9  MG 2.9* 2.7* 2.8* 2.6*  --  2.3  --  PHOS 4.9* 4.8* 5.0* 4.6  --  3.1  --     GFR: Estimated Creatinine Clearance: 17.7 mL/min (A) (by C-G formula based on SCr of 1.93 mg/dL (H)).  Liver Function Tests: Recent Labs  Lab 08/18/22 0021 08/19/22 0017 08/20/22 0734 08/21/22 0027 08/22/22 0029  AST 9* 12* 11* 10* 13*  ALT '10 10 13 12 13  '$ ALKPHOS 49 47 50 48 52  BILITOT 0.4 0.8 0.4 0.6 0.4  PROT 5.4* 6.1* 6.4* 5.9* 6.3*  ALBUMIN 2.5* 2.7* 3.0* 2.9* 3.1*    CBG: Recent Labs  Lab 08/21/22 0609 08/21/22 1140 08/21/22 1638 08/21/22 2121 08/22/22 0601  GLUCAP 130* 185* 158* 168* 134*     Recent Results (from the past 240 hour(s))  Culture, blood (Routine x 2)     Status: None   Collection Time: 08/13/22  5:40 PM   Specimen: BLOOD  Result Value Ref Range Status   Specimen Description BLOOD RIGHT ANTECUBITAL  Final   Special Requests   Final    BOTTLES DRAWN AEROBIC AND ANAEROBIC Blood Culture adequate volume    Culture   Final    NO GROWTH 5 DAYS Performed at [facility], [place] [address] [place], [place] [address]    Report Status 08/18/2022 FINAL  Final  Culture, blood (Routine x 2)     Status: None   Collection Time: 08/13/22  5:50 PM   Specimen: BLOOD RIGHT HAND  Result Value Ref Range Status   Specimen Description BLOOD RIGHT HAND  Final   Special Requests   Final    BOTTLES DRAWN AEROBIC AND ANAEROBIC Blood Culture results may not be optimal due to an inadequate volume of blood received in culture bottles   Culture   Final    NO GROWTH 5 DAYS Performed at [facility], [place] [address] [place], [place] [address]    Report Status 08/18/2022 FINAL  Final  MRSA Next Gen by PCR, Nasal     Status: None   Collection Time: 08/13/22  6:29 PM   Specimen: Nasal Mucosa; Nasal Swab  Result Value Ref Range Status   MRSA by PCR Next Gen NOT DETECTED NOT DETECTED Final    Comment: (NOTE) The GeneXpert MRSA Assay (FDA approved for NASAL specimens only), is one component of a comprehensive MRSA colonization surveillance program. It is not intended to diagnose MRSA infection nor to guide or monitor treatment for MRSA infections. Test performance is not FDA approved in patients less than 10 years old. Performed at [facility], [place] [address], [place], [place] [address]   Respiratory (~20 pathogens) panel by PCR     Status: None   Collection Time: 08/19/22 10:10 AM   Specimen: Nasopharyngeal Swab; Respiratory  Result Value Ref Range Status   Adenovirus NOT DETECTED NOT DETECTED Final   Coronavirus 229E NOT DETECTED NOT DETECTED Final    Comment: (NOTE) The Coronavirus on the Respiratory Panel, DOES NOT test for the novel  Coronavirus (2019 nCoV)    Coronavirus HKU1 NOT DETECTED NOT DETECTED Final   Coronavirus NL63 NOT DETECTED NOT DETECTED Final   Coronavirus OC43 NOT DETECTED NOT DETECTED Final   Metapneumovirus NOT DETECTED NOT DETECTED Final   Rhinovirus / Enterovirus  NOT DETECTED NOT DETECTED Final   Influenza A NOT DETECTED NOT DETECTED Final   Influenza B NOT DETECTED NOT DETECTED Final   Parainfluenza Virus 1 NOT DETECTED NOT DETECTED Final   Parainfluenza Virus 2 NOT DETECTED NOT DETECTED  Final   Parainfluenza Virus 3 NOT DETECTED NOT DETECTED Final   Parainfluenza Virus 4 NOT DETECTED NOT DETECTED Final   Respiratory Syncytial Virus NOT DETECTED NOT DETECTED Final   Bordetella pertussis NOT DETECTED NOT DETECTED Final   Bordetella Parapertussis NOT DETECTED NOT DETECTED Final   Chlamydophila pneumoniae NOT DETECTED NOT DETECTED Final   Mycoplasma pneumoniae NOT DETECTED NOT DETECTED Final    Comment: Performed at [facility], [place] [address] [place], [place] [address]  SARS Coronavirus 2 by RT PCR (hospital order, performed in [facility] hospital lab) *cepheid single result test*     Status: None   Collection Time: 08/19/22 10:10 AM  Result Value Ref Range Status   SARS Coronavirus 2 by RT PCR NEGATIVE NEGATIVE Final    Comment: (NOTE) SARS-CoV-2 target nucleic acids are NOT DETECTED.  The SARS-CoV-2 RNA is generally detectable in upper and lower respiratory specimens during the acute phase of infection. The lowest concentration of SARS-CoV-2 viral copies this assay can detect is 250 copies / mL. A negative result does not preclude SARS-CoV-2 infection and should not be used as the sole basis for treatment or other patient management decisions.  A negative result may occur with improper specimen collection / handling, submission of specimen other than nasopharyngeal swab, presence of viral mutation(s) within the areas targeted by this assay, and inadequate number of viral copies (<250 copies / mL). A negative result must be combined with clinical observations, patient history, and epidemiological information.  Fact Sheet for Patients:   https://www.patel.info/  Fact Sheet for Healthcare  Providers: https://hall.com/  This test is not yet approved or  cleared by the [place] FDA and has been authorized for detection and/or diagnosis of SARS-CoV-2 by FDA under an Emergency Use Authorization (EUA).  This EUA will remain in effect (meaning this test can be used) for the duration of the COVID-19 declaration under Section 564(b)(1) of the Act, 21 U.S.C. section 360bbb-3(b)(1), unless the authorization is terminated or revoked sooner.  Performed at [facility], [place] [address], [place], [place] [address]          Radiology Studies: DG Abd 1 View  Result Date: 08/21/2022 CLINICAL DATA:  Abdominal pain and constipation EXAM: ABDOMEN - 1 VIEW COMPARISON:  CT 12/31/2021 FINDINGS: Cholecystectomy clips in the right upper quadrant. No dilated small or large bowel. Overall amount of fecal matter within the colon appears within normal limits. No significant bone finding. IMPRESSION: No acute finding. Overall amount of fecal matter within the colon appears within normal limits. Electronically Signed   By: Nelson Chimes M.D.   On: 08/21/2022 10:14        Scheduled Meds:  apixaban  2.5 mg Oral BID   calcium carbonate  1 tablet Oral BID   Chlorhexidine Gluconate Cloth  6 each Topical Q0600   FLUoxetine  20 mg Oral Q breakfast   insulin aspart  0-9 Units Subcutaneous TID WC   insulin detemir  9 Units Subcutaneous Daily   ipratropium-albuterol  3 mL Nebulization QID   levothyroxine  50 mcg Oral QAC breakfast   midodrine  5 mg Oral TID WC    morphine injection  1 mg Intravenous Once   polyethylene glycol  17 g Oral BID   pramipexole  0.5 mg Oral QHS   QUEtiapine  100 mg Oral QHS   senna-docusate  1 tablet Oral QHS   sodium chloride flush  3 mL Intravenous Q12H   Continuous Infusions:  amiodarone 30 mg/hr (08/22/22 0745)      LOS: 9 days    Time spent: 11mn  PDomenic Polite MD [facility]   08/22/2022, 10:13 AM

## 2022-08-22 NOTE — Progress Notes (Signed)
Occupational Therapy Treatment Patient Details Name: Melissa [name] MRN: [id number] DOB: 10-Oct-1928 Today's Date: 08/22/2022   History of present illness Melissa [name] is a 88 y.o. female who was admitted on 08/13/22 with paroxysmal atrial fibrillation with RVR. Course complicated by concern for aspiration PNA, HF exacerbation requiring BiPAP 11/6. PMH: HFrEF (EF 20-25% per TTE 04/13/2021), NICM, PAF on Eliquis, CKD stage IIIb, insulin-dependent T2DM, HTN, hypothyroidism, HLD, anxiety, delusional parasitosis, mild cognitive impairment   OT comments  Pt cooperative and readily willing to get OOB with OT. Focus of session on ambulation in room, toileting on BSC and standing grooming all with min guard assist and RW. Pt agreeable to remaining up in chair. Provided a fresh cup of coffee as she reported the coffee from her tray was cold. VSS stable throughout on 3L O2.   Recommendations for follow up therapy are one component of a multi-disciplinary discharge planning process, led by the attending physician.  Recommendations may be updated based on patient status, additional functional criteria and insurance authorization.    Follow Up Recommendations  No OT follow up    Assistance Recommended at Discharge Frequent or constant Supervision/Assistance  Patient can return home with the following  A little help with walking and/or transfers;A lot of help with bathing/dressing/bathroom;Assistance with cooking/housework;Assistance with feeding;Direct supervision/assist for medications management;Direct supervision/assist for financial management;Assist for transportation;Help with stairs or ramp for entrance   Equipment Recommendations  None recommended by OT    Recommendations for Other Services      Precautions / Restrictions Precautions Precautions: Fall Precaution Comments: on 3L O2 baseline Restrictions Weight Bearing Restrictions: No       Mobility Bed Mobility Overal bed mobility: Needs  Assistance Bed Mobility: Supine to Sit     Supine to sit: HOB elevated, Supervision          Transfers Overall transfer level: Needs assistance Equipment used: Rolling walker (2 wheels) Transfers: Sit to/from Stand Sit to Stand: Min guard           General transfer comment: slow to rise     Balance Overall balance assessment: Needs assistance   Sitting balance-Leahy Scale: Fair     Standing balance support: Bilateral upper extremity supported, During functional activity, Reliant on assistive device for balance Standing balance-Leahy Scale: Poor Standing balance comment: can release walker in static standing, dependent for ambulation                           ADL either performed or assessed with clinical judgement   ADL Overall ADL's : Needs assistance/impaired Eating/Feeding: Set up;Sitting Eating/Feeding Details (indicate cue type and reason): set up with coffee once in chair Grooming: Oral care;Wash/dry hands;Wash/dry face;Standing;Minimal assistance                   Toilet Transfer: Minimal assistance;Ambulation;BSC/3in1;Rolling walker (2 wheels)   Toileting- Clothing Manipulation and Hygiene: Sit to/from stand;Minimal assistance       Functional mobility during ADLs: Min guard;Rolling walker (2 wheels)      Extremity/Trunk Assessment              Vision       Perception     Praxis      Cognition Arousal/Alertness: Awake/alert Behavior During Therapy: Flat affect Overall Cognitive Status: History of cognitive impairments - at baseline  Exercises      Shoulder Instructions       General Comments      Pertinent Vitals/ Pain       Pain Assessment Pain Assessment: Faces Faces Pain Scale: Hurts even more Pain Location: abdomen Pain Descriptors / Indicators: Grimacing, Guarding Pain Intervention(s): Monitored during session, Repositioned  Home Living                                           Prior Functioning/Environment              Frequency  Min 2X/week        Progress Toward Goals  OT Goals(current goals can now be found in the care plan section)  Progress towards OT goals: Progressing toward goals  Acute Rehab OT Goals OT Goal Formulation: With patient Time For Goal Achievement: 2022-09-03 Potential to Achieve Goals: Good  Plan Discharge plan remains appropriate    Co-evaluation                 AM-PAC OT "6 Clicks" Daily Activity     Outcome Measure   Help from another person eating meals?: A Little Help from another person taking care of personal grooming?: A Little Help from another person toileting, which includes using toliet, bedpan, or urinal?: A Little Help from another person bathing (including washing, rinsing, drying)?: A Lot Help from another person to put on and taking off regular upper body clothing?: A Little Help from another person to put on and taking off regular lower body clothing?: A Lot 6 Click Score: 16    End of Session Equipment Utilized During Treatment: Rolling walker (2 wheels);Gait belt;Oxygen  OT Visit Diagnosis: Unsteadiness on feet (R26.81);Other abnormalities of gait and mobility (R26.89);Other symptoms and signs involving cognitive function;Muscle weakness (generalized) (M62.81)   Activity Tolerance Patient tolerated treatment well   Patient Left in chair;with call bell/phone within reach;with chair alarm set   Nurse Communication          Time: [phone] OT Time Calculation (min): 27 min  Charges: OT General Charges $OT Visit: 1 Visit OT Treatments $Self Care/Home Management : 23-37 mins Cleta Alberts, OTR/L Acute Rehabilitation Services Office: [phone]   Malka So 08/22/2022, 11:17 AM

## 2022-08-22 NOTE — Progress Notes (Signed)
Pt not on BIPAP at this time, no distress is noted, spo2 100% and RR 19. RT will continue to monitor.

## 2022-08-23 DIAGNOSIS — I5043 Acute on chronic combined systolic (congestive) and diastolic (congestive) heart failure: Secondary | ICD-10-CM | POA: Diagnosis not present

## 2022-08-23 DIAGNOSIS — I480 Paroxysmal atrial fibrillation: Secondary | ICD-10-CM | POA: Diagnosis not present

## 2022-08-23 DIAGNOSIS — I5022 Chronic systolic (congestive) heart failure: Secondary | ICD-10-CM | POA: Diagnosis not present

## 2022-08-23 DIAGNOSIS — Z515 Encounter for palliative care: Secondary | ICD-10-CM | POA: Diagnosis not present

## 2022-08-23 LAB — CBC
HCT: 29.2 % — ABNORMAL LOW (ref 36.0–46.0)
Hemoglobin: 9.3 g/dL — ABNORMAL LOW (ref 12.0–15.0)
MCH: 27.4 pg (ref 26.0–34.0)
MCHC: 31.8 g/dL (ref 30.0–36.0)
MCV: 86.1 fL (ref 80.0–100.0)
Platelets: 164 10*3/uL (ref 150–400)
RBC: 3.39 MIL/uL — ABNORMAL LOW (ref 3.87–5.11)
RDW: 17.4 % — ABNORMAL HIGH (ref 11.5–15.5)
WBC: 6.2 10*3/uL (ref 4.0–10.5)
nRBC: 0 % (ref 0.0–0.2)

## 2022-08-23 LAB — COMPREHENSIVE METABOLIC PANEL
ALT: 10 U/L (ref 0–44)
AST: 12 U/L — ABNORMAL LOW (ref 15–41)
Albumin: 2.9 g/dL — ABNORMAL LOW (ref 3.5–5.0)
Alkaline Phosphatase: 48 U/L (ref 38–126)
Anion gap: 9 (ref 5–15)
BUN: 32 mg/dL — ABNORMAL HIGH (ref 8–23)
CO2: 29 mmol/L (ref 22–32)
Calcium: 8.5 mg/dL — ABNORMAL LOW (ref 8.9–10.3)
Chloride: 92 mmol/L — ABNORMAL LOW (ref 98–111)
Creatinine, Ser: 1.8 mg/dL — ABNORMAL HIGH (ref 0.44–1.00)
GFR, Estimated: 26 mL/min — ABNORMAL LOW (ref >60)
Glucose, Bld: 122 mg/dL — ABNORMAL HIGH (ref 70–99)
Potassium: 4.6 mmol/L (ref 3.5–5.1)
Sodium: 130 mmol/L — ABNORMAL LOW (ref 135–145)
Total Bilirubin: 0.2 mg/dL — ABNORMAL LOW (ref 0.3–1.2)
Total Protein: 5.7 g/dL — ABNORMAL LOW (ref 6.5–8.1)

## 2022-08-23 LAB — GLUCOSE, CAPILLARY
Glucose-Capillary: 124 mg/dL — ABNORMAL HIGH (ref 70–99)
Glucose-Capillary: 181 mg/dL — ABNORMAL HIGH (ref 70–99)
Glucose-Capillary: 191 mg/dL — ABNORMAL HIGH (ref 70–99)
Glucose-Capillary: 180 mg/dL — ABNORMAL HIGH (ref 70–99)

## 2022-08-23 MED ORDER — LEVALBUTEROL HCL 0.63 MG/3ML IN NEBU
0.6300 mg | INHALATION_SOLUTION | Freq: Four times a day (QID) | RESPIRATORY_TRACT | Status: DC | PRN
  Administered 2022-08-23 – 2022-08-24 (×2): 0.63 mg via RESPIRATORY_TRACT
  Filled 2022-08-23 (×2): qty 3

## 2022-08-23 MED ORDER — FUROSEMIDE 10 MG/ML IJ SOLN
40.0000 mg | Freq: Four times a day (QID) | INTRAMUSCULAR | Status: AC
  Administered 2022-08-23 (×2): 40 mg via INTRAVENOUS
  Filled 2022-08-23 (×2): qty 4

## 2022-08-23 NOTE — Progress Notes (Signed)
PROGRESS NOTE    Melissa [name]  CZY:[id number] DOB: October 14, 1928 DOA: 08/13/2022 PCP: Darreld Mclean, MD  Chief Complaint  Patient presents with   Code Sepsis    Brief Narrative:  Melissa [name] is a 88 y.o. female with medical history significant for HFrEF (EF 20-25% per TTE 04/13/2021), NICM, PAF on Eliquis, CKD stage IIIb, insulin-dependent T2DM, HTN, hypothyroidism, HLD, anxiety, delusional parasitosis, mild cognitive impairment who is admitted with paroxysmal atrial fibrillation with RVR.  Seen by cardiology and started on IV amiodarone drip.  She was started on unasyn with concern for aspiration pneumonia as well.  She developed progressive acute kidney injury and renal was consulted.  Creatinine has plateau'd and is improving, but she's developed worsening CXR findings, rising weights suggestive of HF exacerbation.  Required bipap 11/6 as well as diuresis with lasix.   -Intermittent rapid A-fib, back on IV amiodarone -Required frequent dosing of IV Lasix as well -Palliative consulted for goals of care, patient and family want to continue current scope of treatment  Subjective: Denies any belly pain today, overnight with some shortness of breath, placed on BiPAP again  Assessment and Plan:  Acute on Chronic Hypoxic Respiratory failure  - on chronic supplemental O2 (3 L ),  - treated for PNA and CHF  -CXR with findings concerning for multifocal pna,  s/p 5 days unasyn for possible aspiration.  Required bipap 11/6 for developing resp distress, crackles,  - SLP eval -> mild aspiration risk, dysphagia 3/thin liquid diet recommended - pulm toilet, flutter valve -ambulate, wean O2, out of bed to chair -Repeat IV Lasix today  Acute on Chronic systolic CHF EF 09-32% by last TTE 12/17/5730.  , complicated by AFib RVR, hypotension, AKI, repeat echo-> EF 25%, global hypokinesis, moderately reduced RVSF  -given IV lasix intermittently -Toprol on hold with hypotension -GDMT on hold, now on  midodrine -Cardiology following, repeat IV Lasix X2 today -Palliative team following as well, patient wants to continue current scope of treatment but this morning is agreeable to DNR  Paroxysmal atrial fibrillation with rapid ventricular response ([place]) Presenting with A-fib with RVR, rate 140s.  Blood pressure soft.  Cardiology consulted and started IV amiodarone drip -Cardiology following, appreciate assistance - transitioned to PO amiodarone -Hold Toprol-XL with soft blood pressure -back on IV amiodarone -Continue Eliquis 2.5 mg twice daily  AKI on CKD 3a Acute Urinary Retention Creatinine 2.42 on admit compared to baseline around 1.3.  Suspected cardiorenal from hypoperfusion in setting of A-fib with RVR, hypotension -creatinine improving -Renal ultrasound with chronic medical renal disease -s/p indwelling foley catheter placed 11/5 -Foley catheter removed yesterday, monitor for retention  Delirium Suspect this is all due to the acute hospitalization, SOB, hypoxic respiratory failure, improving Delirium precautions -Continue seroquel  Abd pain, constipation - having BMs continue laxatives -LFTs ok, unremarkable KUB and lipase -Improved, Foley removed,  Type 2 diabetes mellitus  -CBGs stable, continue current dose of Insulin  Hypothyroidism, acquired Continue home dose Synthroid 50 mcg daily.   TSH (3.5 wnl).   Hypomagnesemia Replace and follow  Anemia -Mild stable, trend  DVT prophylaxis: eliquis Code Status: Discussed CODE STATUS with the patient this morning 11/10, she is agreeable to DNR and wishes for a natural death Family Communication: None at bedside, will update daughter Disposition: Home health services   Consultants:  cardiology  Procedures:  Echo IMPRESSIONS     1. Left ventricular ejection fraction, by estimation, is 25%. The left  ventricle has severely decreased function.  The left ventricle demonstrates  global hypokinesis. Left ventricular  diastolic parameters are  indeterminate.   2. Right ventricular systolic function is moderately reduced. The right  ventricular size is normal. There is normal pulmonary artery systolic  pressure. The estimated right ventricular systolic pressure is 45.8 mmHg.   3. Left atrial size was moderately dilated.   4. The mitral valve is abnormal with restricted posterior leaflet.  Moderate mitral valve regurgitation. No evidence of mitral stenosis.   5. Tricuspid valve regurgitation is mild to moderate.   6. The aortic valve is tricuspid. Aortic valve regurgitation is not  visualized. No aortic stenosis is present.   7. The inferior vena cava is normal in size with <50% respiratory  variability, suggesting right atrial pressure of 8 mmHg.   8. The patient was in atrial fibrillation.    Antimicrobials:  Anti-infectives (From admission, onward)    Start     Dose/Rate Route Frequency Ordered Stop   08/14/22 1915  ampicillin-sulbactam (UNASYN) 1.5 g in sodium chloride 0.9 % 100 mL IVPB        1.5 g 200 mL/hr over 30 Minutes Intravenous Every 12 hours 08/14/22 1817 08/19/22 1929   08/13/22 1923  vancomycin variable dose per unstable renal function (pharmacist dosing)  Status:  Discontinued         Does not apply See admin instructions 08/13/22 1924 08/13/22 2105   08/13/22 1830  vancomycin (VANCOREADY) IVPB 1250 mg/250 mL        1,250 mg 166.7 mL/hr over 90 Minutes Intravenous  Once 08/13/22 1828 08/13/22 2040   08/13/22 1815  ceFEPIme (MAXIPIME) 2 g in sodium chloride 0.9 % 100 mL IVPB        2 g 200 mL/hr over 30 Minutes Intravenous  Once 08/13/22 1804 08/13/22 1844       Objective: Vitals:   08/22/22 2326 08/23/22 0342 08/23/22 0400 08/23/22 0725  BP: 92/72 (!) 115/94 122/84 (!) 112/91  Pulse: 93  99 (!) 101  Resp: '18  20 20  '$ Temp: 98 F (36.7 C) 97.6 F (36.4 C)  97.8 F (36.6 C)  TempSrc: Axillary Oral  Oral  SpO2: 100%  100% 100%  Weight:  72.4 kg    Height:         Intake/Output Summary (Last 24 hours) at 08/23/2022 1016 Last data filed at 08/23/2022 0725 Gross per 24 hour  Intake 515.9 ml  Output 550 ml  Net -34.1 ml   Filed Weights   08/21/22 0415 08/22/22 0300 08/23/22 0342  Weight: 70.2 kg 68.7 kg 72.4 kg    Examination:  General: Elderly chronically ill female sitting up in bed, AAOx3 HEENT: Positive JVD CVS: S1-S2, irregularly irregular rhythm Lungs: Fine bilateral rales Abdomen: Soft, nontender, bowel sounds present Extremities: 1+ edema   Data Reviewed: I have personally reviewed following labs and imaging studies  CBC: Recent Labs  Lab 08/17/22 0027 08/18/22 0021 08/19/22 0017 08/20/22 0734 08/21/22 0027 08/22/22 0029 08/23/22 0021  WBC 8.0 4.4 5.3 6.4 6.5 6.0 6.2  NEUTROABS 5.6 2.5 4.4 4.5 4.6  --   --   HGB 8.6* 8.5* 8.6* 9.5* 9.7* 10.3* 9.3*  HCT 27.5* 27.3* 28.3* 30.2* 29.9* 33.2* 29.2*  MCV 87.0 85.8 87.9 85.8 85.4 86.7 86.1  PLT 157 145* 170 180 186 172 099    Basic Metabolic Panel: Recent Labs  Lab 08/17/22 0027 08/18/22 0021 08/19/22 0017 08/20/22 0734 08/21/22 0027 08/22/22 0029 08/23/22 0021  NA 135 134* 132* 135 134*  131* 130*  K 4.2 4.2 4.5 4.3 4.5 4.6 4.6  CL 102 102 101 99 99 95* 92*  CO2 '22 25 25 27 26 30 29  '$ GLUCOSE 135* 81 145* 136* 125* 132* 122*  BUN 50* 51* 46* 42* 38* 34* 32*  CREATININE 3.17* 3.19* 2.55* 2.05* 1.84* 1.93* 1.80*  CALCIUM 8.1* 8.1* 8.2* 8.5* 8.6* 8.9 8.5*  MG 2.7* 2.8* 2.6*  --  2.3  --   --   PHOS 4.8* 5.0* 4.6  --  3.1  --   --     GFR: Estimated Creatinine Clearance: 19.5 mL/min (A) (by C-G formula based on SCr of 1.8 mg/dL (H)).  Liver Function Tests: Recent Labs  Lab 08/19/22 0017 08/20/22 0734 08/21/22 0027 08/22/22 0029 08/23/22 0021  AST 12* 11* 10* 13* 12*  ALT '10 13 12 13 10  '$ ALKPHOS 47 50 48 52 48  BILITOT 0.8 0.4 0.6 0.4 0.2*  PROT 6.1* 6.4* 5.9* 6.3* 5.7*  ALBUMIN 2.7* 3.0* 2.9* 3.1* 2.9*    CBG: Recent Labs  Lab 08/22/22 0601  08/22/22 1054 08/22/22 1622 08/22/22 2110 08/23/22 0559  GLUCAP 134* 193* 195* 126* 124*     Recent Results (from the past 240 hour(s))  Culture, blood (Routine x 2)     Status: None   Collection Time: 08/13/22  5:40 PM   Specimen: BLOOD  Result Value Ref Range Status   Specimen Description BLOOD RIGHT ANTECUBITAL  Final   Special Requests   Final    BOTTLES DRAWN AEROBIC AND ANAEROBIC Blood Culture adequate volume   Culture   Final    NO GROWTH 5 DAYS Performed at [facility], [place] [address] [place], [place] [address]    Report Status 08/18/2022 FINAL  Final  Culture, blood (Routine x 2)     Status: None   Collection Time: 08/13/22  5:50 PM   Specimen: BLOOD RIGHT HAND  Result Value Ref Range Status   Specimen Description BLOOD RIGHT HAND  Final   Special Requests   Final    BOTTLES DRAWN AEROBIC AND ANAEROBIC Blood Culture results may not be optimal due to an inadequate volume of blood received in culture bottles   Culture   Final    NO GROWTH 5 DAYS Performed at [facility], [place] [address] [place], [place] [address]    Report Status 08/18/2022 FINAL  Final  MRSA Next Gen by PCR, Nasal     Status: None   Collection Time: 08/13/22  6:29 PM   Specimen: Nasal Mucosa; Nasal Swab  Result Value Ref Range Status   MRSA by PCR Next Gen NOT DETECTED NOT DETECTED Final    Comment: (NOTE) The GeneXpert MRSA Assay (FDA approved for NASAL specimens only), is one component of a comprehensive MRSA colonization surveillance program. It is not intended to diagnose MRSA infection nor to guide or monitor treatment for MRSA infections. Test performance is not FDA approved in patients less than 92 years old. Performed at [facility], [place] [address], [place], [place] [address]   Respiratory (~20 pathogens) panel by PCR     Status: None   Collection Time: 08/19/22 10:10 AM   Specimen: Nasopharyngeal Swab; Respiratory  Result Value Ref Range Status   Adenovirus  NOT DETECTED NOT DETECTED Final   Coronavirus 229E NOT DETECTED NOT DETECTED Final    Comment: (NOTE) The Coronavirus on the Respiratory Panel, DOES NOT test for the novel  Coronavirus (2019 nCoV)  Coronavirus HKU1 NOT DETECTED NOT DETECTED Final   Coronavirus NL63 NOT DETECTED NOT DETECTED Final   Coronavirus OC43 NOT DETECTED NOT DETECTED Final   Metapneumovirus NOT DETECTED NOT DETECTED Final   Rhinovirus / Enterovirus NOT DETECTED NOT DETECTED Final   Influenza A NOT DETECTED NOT DETECTED Final   Influenza B NOT DETECTED NOT DETECTED Final   Parainfluenza Virus 1 NOT DETECTED NOT DETECTED Final   Parainfluenza Virus 2 NOT DETECTED NOT DETECTED Final   Parainfluenza Virus 3 NOT DETECTED NOT DETECTED Final   Parainfluenza Virus 4 NOT DETECTED NOT DETECTED Final   Respiratory Syncytial Virus NOT DETECTED NOT DETECTED Final   Bordetella pertussis NOT DETECTED NOT DETECTED Final   Bordetella Parapertussis NOT DETECTED NOT DETECTED Final   Chlamydophila pneumoniae NOT DETECTED NOT DETECTED Final   Mycoplasma pneumoniae NOT DETECTED NOT DETECTED Final    Comment: Performed at [facility], [place] [address] [place], [place] [address]  SARS Coronavirus 2 by RT PCR (hospital order, performed in [facility] hospital lab) *cepheid single result test*     Status: None   Collection Time: 08/19/22 10:10 AM  Result Value Ref Range Status   SARS Coronavirus 2 by RT PCR NEGATIVE NEGATIVE Final    Comment: (NOTE) SARS-CoV-2 target nucleic acids are NOT DETECTED.  The SARS-CoV-2 RNA is generally detectable in upper and lower respiratory specimens during the acute phase of infection. The lowest concentration of SARS-CoV-2 viral copies this assay can detect is 250 copies / mL. A negative result does not preclude SARS-CoV-2 infection and should not be used as the sole basis for treatment or other patient management decisions.  A negative result may occur with improper specimen collection /  handling, submission of specimen other than nasopharyngeal swab, presence of viral mutation(s) within the areas targeted by this assay, and inadequate number of viral copies (<250 copies / mL). A negative result must be combined with clinical observations, patient history, and epidemiological information.  Fact Sheet for Patients:   https://www.patel.info/  Fact Sheet for Healthcare Providers: https://hall.com/  This test is not yet approved or  cleared by the [place] FDA and has been authorized for detection and/or diagnosis of SARS-CoV-2 by FDA under an Emergency Use Authorization (EUA).  This EUA will remain in effect (meaning this test can be used) for the duration of the COVID-19 declaration under Section 564(b)(1) of the Act, 21 U.S.C. section 360bbb-3(b)(1), unless the authorization is terminated or revoked sooner.  Performed at [facility], [place] [address], [place], [place] [address]     Scheduled Meds:  apixaban  2.5 mg Oral BID   calcium carbonate  1 tablet Oral BID   Chlorhexidine Gluconate Cloth  6 each Topical Q0600   FLUoxetine  20 mg Oral Q breakfast   furosemide  40 mg Intravenous Q6H   insulin aspart  0-9 Units Subcutaneous TID WC   insulin detemir  9 Units Subcutaneous Daily   ipratropium-albuterol  3 mL Nebulization BID   levothyroxine  50 mcg Oral QAC breakfast   midodrine  5 mg Oral TID WC    morphine injection  1 mg Intravenous Once   polyethylene glycol  17 g Oral BID   pramipexole  0.5 mg Oral QHS   QUEtiapine  100 mg Oral QHS   senna-docusate  1 tablet Oral QHS   sodium chloride flush  3 mL Intravenous Q12H   Continuous Infusions:  amiodarone 30 mg/hr (08/22/22 2102)  LOS: 10 days    Time spent: 16mn  PDomenic Polite MD [facility]   08/23/2022, 10:16 AM

## 2022-08-23 NOTE — Progress Notes (Signed)
Rounding Note    Patient Name: Melissa [name] Date of Encounter: 08/23/2022  [facility] Cardiologist: [name], MD   Subjective   Rates better on IV amiodarone, but still around 100. Some band like discomfort around costal margin. Appears brighter and thinking clearly. Granddaughter is MPOA and at bedside.  Inpatient Medications    Scheduled Meds:  apixaban  2.5 mg Oral BID   calcium carbonate  1 tablet Oral BID   Chlorhexidine Gluconate Cloth  6 each Topical Q0600   FLUoxetine  20 mg Oral Q breakfast   insulin aspart  0-9 Units Subcutaneous TID WC   insulin detemir  9 Units Subcutaneous Daily   ipratropium-albuterol  3 mL Nebulization BID   levothyroxine  50 mcg Oral QAC breakfast   midodrine  5 mg Oral TID WC    morphine injection  1 mg Intravenous Once   polyethylene glycol  17 g Oral BID   pramipexole  0.5 mg Oral QHS   QUEtiapine  100 mg Oral QHS   senna-docusate  1 tablet Oral QHS   sodium chloride flush  3 mL Intravenous Q12H   Continuous Infusions:  amiodarone 30 mg/hr (08/22/22 2102)    PRN Meds: acetaminophen **OR** acetaminophen, guaiFENesin-dextromethorphan, morphine injection, ondansetron **OR** ondansetron (ZOFRAN) IV, mouth rinse   Vital Signs    Vitals:   08/23/22 0342 08/23/22 0400 08/23/22 0725 08/23/22 1111  BP: (!) 115/94 122/84 (!) 112/91 (!) 122/94  Pulse:  99 (!) 101 (!) 102  Resp:  '20 20 20  '$ Temp: 97.6 F (36.4 C)  97.8 F (36.6 C) 97.8 F (36.6 C)  TempSrc: Oral  Oral Oral  SpO2:  100% 100% 100%  Weight: 72.4 kg     Height:        Intake/Output Summary (Last 24 hours) at 08/23/2022 1518 Last data filed at 08/23/2022 1300 Gross per 24 hour  Intake 575.9 ml  Output 350 ml  Net 225.9 ml      08/23/2022    3:42 AM 08/22/2022    3:00 AM 08/21/2022    4:15 AM  Last 3 Weights  Weight (lbs) 159 lb 9.8 oz 151 lb 7.3 oz 154 lb 12.2 oz  Weight (kg) 72.4 kg 68.7 kg 70.2 kg      Telemetry    Afib 100- Personally  Reviewed  ECG    Afib - Personally Reviewed  Physical Exam   GEN: No acute distress Neck: No JVD Cardiac: irregular and tachycardic Respiratory: crackles in bases GI: Soft, TTP LLQ, non-distended  MS: no edema; No deformity. Cold on exam Neuro:  Nonfocal  Psych: Normal affect, not oriented  Labs    High Sensitivity Troponin:   Recent Labs  Lab 08/13/22 1957 08/16/22 1535 08/16/22 1806  TROPONINIHS '12 14 13     '$ Chemistry Recent Labs  Lab 08/18/22 0021 08/19/22 0017 08/20/22 0734 08/21/22 0027 08/22/22 0029 08/23/22 0021  NA 134* 132*   < > 134* 131* 130*  K 4.2 4.5   < > 4.5 4.6 4.6  CL 102 101   < > 99 95* 92*  CO2 25 25   < > '26 30 29  '$ GLUCOSE 81 145*   < > 125* 132* 122*  BUN 51* 46*   < > 38* 34* 32*  CREATININE 3.19* 2.55*   < > 1.84* 1.93* 1.80*  CALCIUM 8.1* 8.2*   < > 8.6* 8.9 8.5*  MG 2.8* 2.6*  --  2.3  --   --  PROT 5.4* 6.1*   < > 5.9* 6.3* 5.7*  ALBUMIN 2.5* 2.7*   < > 2.9* 3.1* 2.9*  AST 9* 12*   < > 10* 13* 12*  ALT 10 10   < > '12 13 10  '$ ALKPHOS 49 47   < > 48 52 48  BILITOT 0.4 0.8   < > 0.6 0.4 0.2*  GFRNONAA 13* 17*   < > 25* 24* 26*  ANIONGAP 7 6   < > '9 6 9   '$ < > = values in this interval not displayed.    Lipids No results for input(s): "CHOL", "TRIG", "HDL", "LABVLDL", "LDLCALC", "CHOLHDL" in the last 168 hours.  Hematology Recent Labs  Lab 08/21/22 0027 08/22/22 0029 08/23/22 0021  WBC 6.5 6.0 6.2  RBC 3.50* 3.83* 3.39*  HGB 9.7* 10.3* 9.3*  HCT 29.9* 33.2* 29.2*  MCV 85.4 86.7 86.1  MCH 27.7 26.9 27.4  MCHC 32.4 31.0 31.8  RDW 17.7* 17.6* 17.4*  PLT 186 172 164   Thyroid  No results for input(s): "TSH", "FREET4" in the last 168 hours.   BNP Recent Labs  Lab 08/19/22 0017  BNP 1,406.9*    DDimer No results for input(s): "DDIMER" in the last 168 hours.   Radiology    No results found.  Cardiac Studies   Echocardiogram 08/14/22 1. Left ventricular ejection fraction, by estimation, is 25%. The left   ventricle has severely decreased function. The left ventricle demonstrates  global hypokinesis. Left ventricular diastolic parameters are  indeterminate.   2. Right ventricular systolic function is moderately reduced. The right  ventricular size is normal. There is normal pulmonary artery systolic  pressure. The estimated right ventricular systolic pressure is 92.3 mmHg.   3. Left atrial size was moderately dilated.   4. The mitral valve is abnormal with restricted posterior leaflet.  Moderate mitral valve regurgitation. No evidence of mitral stenosis.   5. Tricuspid valve regurgitation is mild to moderate.   6. The aortic valve is tricuspid. Aortic valve regurgitation is not  visualized. No aortic stenosis is present.   7. The inferior vena cava is normal in size with <50% respiratory  variability, suggesting right atrial pressure of 8 mmHg.   8. The patient was in atrial fibrillation.   Patient Profile     88 y.o. female with a hx of atrial fib followed by [name], last seen 08/2021 also EF 20-25% NICM, delusional disorder, DM-2, GERD now admitted with sepsis, cardiology consulted for management of paroxysmal afib. Had brief sinus rhythm in hospital but back in afib with RVR and hypotension. Note patient was admitted in 05/2021 with pneumonia, found to have A-fib with RVR.  She was started on Eliquis 2.5 twice daily, underwent cardioversion in 05/2021.  More recently admitted in 06/2022 with pneumonia, maintained normal sinus rhythm that admission. Now presented with shortness of breath. Found to be in afib with RVR  Assessment & Plan    Paroxysmal Atrial Fibrillation  - Treated with IV amiodarone and converted to NSR and then had recurrence of Afib.  - amio load complete, doing slightly better on IV amio, will continue since oral was less effective. - consider IV amio 150 mg bolus administered slowly if further rate control needed. - Home metoprolol held due to soft BP while in Afib.  BP is better with better rate control and addition of midodrine per neph. - Consider adding low dose metoprolol for rate control as needed, challenging with low BP -  Continue Eliquis 2.5 mg twice daily (dose reduced due to age, renal function) - Echo this admission with EF 25%, moderately reduced RV function, moderately dilated LA, moderate mitral valve regurgitation.  - Maintain K>4, mag>2    Chronic systolic heart failure - Most recent echocardiogram completed 04/2021 showed EF 20-25%, global hypokinesis, normal RV systolic function - Echo this admission with EF 25%, moderately reduced RV systolic function, moderate mitral valve regurgitation (restricted posterior leaflet)  -Home metoprolol held due to soft BP.  Not on ACE/ARB/ARNI or Arlyce Harman given CKD - She is cold on exam today, and we are having difficulty achieving rate control due to hypotension. I do not think she would tolerate sedation for elective cardioversion.  - dry weight 1 year ago in office weight was 71.2 kg. Up to 72.4 today and required bipap overnight, received lasix today but with minimal output. Received fluids early in hospitalization and then had volume overload and was diuresed. Narrow fluid window.  - recommend goals of care discussion, enrolled in hospice at home but off hospice for admission, I discussed again.    Hypotension - BP low this admission  - Holding home BP medications  - Patient has received IV fluids, then with volume overload but improvement in renal function.  - Suspect BP will improve with HR control, has improved with midodrine. - midodrine added by nephrology. Maintaining BP.   AKI on CKD stage IIIb - baseline approximately 1.3 -Creatinine further increased to 3, improved with hydration and subsequent lasix. - Holding home BP medications to avoid hypotension. - possible poor PO intake is contributing.   Very challenging situation given patient's delirium, abdominal pain, hypotension and difficulty  with rate control. Not many good options. [place] discussion started.    For questions or updates, please contact [place] Please consult [email or web address] for contact info under        Signed, Elouise Munroe, MD  08/23/2022, 3:18 PM

## 2022-08-23 NOTE — Progress Notes (Signed)
Pt not wearing BIPAP at this time, no distress noted. Spo2 98% RR 18

## 2022-08-23 NOTE — Progress Notes (Signed)
Physical Therapy Treatment Patient Details Name: Melissa [name] MRN: [id number] DOB: [birth year] Today's Date: 08/23/2022   History of Present Illness Melissa [name] is a 88 y.o. female who was admitted on 08/13/22 with paroxysmal atrial fibrillation with RVR. Course complicated by concern for aspiration PNA, HF exacerbation requiring BiPAP 11/6. CT 11/8 with no acute abdominal findings. PMH: HFrEF (EF 20-25% per TTE 04/13/2021), NICM, PAF on Eliquis, CKD stage IIIb, insulin-dependent T2DM, HTN, hypothyroidism, HLD, anxiety, delusional parasitosis, mild cognitive impairment    PT Comments    Pt minimally progressing with mobility. Today's session focused on transfers and ambulation. Pt ambulated 61f RW min guard in room, pt declined progressing distance of ambulation. Upon arrival to recliner, pt reported "air feeling thick" and required cues for appropriate breathing techniques. Pt remains limited by generalized weakness, decreased activity tolerance, and impaired balance strategies/postural reactions. Continue to recommend acute PT services to maximize functional mobility and independence prior to d/c to HHPT.    Recommendations for follow up therapy are one component of a multi-disciplinary discharge planning process, led by the attending physician.  Recommendations may be updated based on patient status, additional functional criteria and insurance authorization.  Follow Up Recommendations  Home health PT     Assistance Recommended at Discharge Frequent or constant Supervision/Assistance  Patient can return home with the following A little help with walking and/or transfers;A little help with bathing/dressing/bathroom;Direct supervision/assist for medications management;Assist for transportation;Help with stairs or ramp for entrance   Equipment Recommendations  None recommended by PT    Recommendations for Other Services       Precautions / Restrictions Precautions Precautions:  Fall;Other (comment) Precaution Comments: on 3L O2 baseline Restrictions Weight Bearing Restrictions: No     Mobility  Bed Mobility Overal bed mobility: Needs Assistance Bed Mobility: Supine to Sit     Supine to sit: HOB elevated, Supervision     General bed mobility comments: supine > sit supervision with use of 1 hand rail; demonstrated core activation to long sit temporarily during mobility    Transfers Overall transfer level: Needs assistance Equipment used: Rolling walker (2 wheels) Transfers: Sit to/from Stand Sit to Stand: Min guard           General transfer comment: cues for hand placement with RW and to scoot hips to EOB before initial ascent    Ambulation/Gait Ambulation/Gait assistance: Min guard, Min assist Gait Distance (Feet): 16 Feet Assistive device: Rolling walker (2 wheels) Gait Pattern/deviations: Step-through pattern, Decreased stride length, Trunk flexed Gait velocity: decreased     General Gait Details: gait around bed to recliner min guard to minA for guidance and verbal cues of RW placement, encouraged to ambulate further but pt denied request   Stairs             Wheelchair Mobility    Modified Rankin (Stroke Patients Only)       Balance Overall balance assessment: Needs assistance Sitting-balance support: Single extremity supported, Feet unsupported, No upper extremity supported Sitting balance-Leahy Scale: Fair Sitting balance - Comments: pt sitting EOB supervision, demonstrated dynamic weight shifting with ability to self correct to prevent LOB   Standing balance support: Bilateral upper extremity supported, During functional activity, No upper extremity supported Standing balance-Leahy Scale: Poor Standing balance comment: removal of UEs during static standing while removing purewick but heavy reliance on RW for upright positioning  Cognition Arousal/Alertness: Awake/alert Behavior  During Therapy: WFL for tasks assessed/performed Overall Cognitive Status: History of cognitive impairments - at baseline                                 General Comments: pt pleasant and confused, was correct on year but stated today was thursday; at end of session, appeared lonely and mistook therapist as a family member. follows simple commands with increased time        Exercises      General Comments General comments (skin integrity, edema, etc.): SpO2 stable on 3-4L (pt reported air feeling thick so temporarily increased to 4L during mobility, although solid readings at 97% with reliable pleth); HR up to 121 bpm briefly during mobility      Pertinent Vitals/Pain Pain Assessment Faces Pain Scale: No hurt Pain Intervention(s): Monitored during session    Home Living                          Prior Function            PT Goals (current goals can now be found in the care plan section) Acute Rehab PT Goals Patient Stated Goal: to be better PT Goal Formulation: With patient Time For Goal Achievement: 09/04/22 Potential to Achieve Goals: Fair Progress towards PT goals: Progressing toward goals    Frequency    Min 3X/week      PT Plan Current plan remains appropriate    Co-evaluation              AM-PAC PT "6 Clicks" Mobility   Outcome Measure  Help needed turning from your back to your side while in a flat bed without using bedrails?: A Little Help needed moving from lying on your back to sitting on the side of a flat bed without using bedrails?: A Little Help needed moving to and from a bed to a chair (including a wheelchair)?: A Little Help needed standing up from a chair using your arms (e.g., wheelchair or bedside chair)?: A Little Help needed to walk in hospital room?: Total Help needed climbing 3-5 steps with a railing? : Total 6 Click Score: 14    End of Session Equipment Utilized During Treatment: Gait  belt;Oxygen Activity Tolerance: Patient limited by fatigue Patient left: in chair;with call bell/phone within reach;with chair alarm set Nurse Communication: Mobility status PT Visit Diagnosis: Unsteadiness on feet (R26.81);Muscle weakness (generalized) (M62.81);Difficulty in walking, not elsewhere classified (R26.2)     Time: 8101-7510 PT Time Calculation (min) (ACUTE ONLY): 26 min  Charges:  $Therapeutic Activity: 8-22 mins $Self Care/Home Management: 8-22                     Chipper Oman, SPT    Herrick Jayzon Taras 08/23/2022, 12:20 PM

## 2022-08-24 DIAGNOSIS — I5022 Chronic systolic (congestive) heart failure: Secondary | ICD-10-CM | POA: Diagnosis not present

## 2022-08-24 DIAGNOSIS — N179 Acute kidney failure, unspecified: Secondary | ICD-10-CM | POA: Diagnosis not present

## 2022-08-24 DIAGNOSIS — I5023 Acute on chronic systolic (congestive) heart failure: Secondary | ICD-10-CM

## 2022-08-24 DIAGNOSIS — E871 Hypo-osmolality and hyponatremia: Secondary | ICD-10-CM

## 2022-08-24 DIAGNOSIS — Z515 Encounter for palliative care: Secondary | ICD-10-CM | POA: Diagnosis not present

## 2022-08-24 DIAGNOSIS — I5043 Acute on chronic combined systolic (congestive) and diastolic (congestive) heart failure: Secondary | ICD-10-CM | POA: Diagnosis not present

## 2022-08-24 DIAGNOSIS — I480 Paroxysmal atrial fibrillation: Secondary | ICD-10-CM | POA: Diagnosis not present

## 2022-08-24 LAB — BASIC METABOLIC PANEL
Anion gap: 9 (ref 5–15)
BUN: 28 mg/dL — ABNORMAL HIGH (ref 8–23)
CO2: 29 mmol/L (ref 22–32)
Calcium: 8.4 mg/dL — ABNORMAL LOW (ref 8.9–10.3)
Chloride: 88 mmol/L — ABNORMAL LOW (ref 98–111)
Creatinine, Ser: 1.78 mg/dL — ABNORMAL HIGH (ref 0.44–1.00)
GFR, Estimated: 26 mL/min — ABNORMAL LOW (ref >60)
Glucose, Bld: 154 mg/dL — ABNORMAL HIGH (ref 70–99)
Potassium: 4.7 mmol/L (ref 3.5–5.1)
Sodium: 126 mmol/L — ABNORMAL LOW (ref 135–145)

## 2022-08-24 LAB — GLUCOSE, CAPILLARY
Glucose-Capillary: 145 mg/dL — ABNORMAL HIGH (ref 70–99)
Glucose-Capillary: 157 mg/dL — ABNORMAL HIGH (ref 70–99)
Glucose-Capillary: 169 mg/dL — ABNORMAL HIGH (ref 70–99)
Glucose-Capillary: 138 mg/dL — ABNORMAL HIGH (ref 70–99)

## 2022-08-24 LAB — CBC
HCT: 29.5 % — ABNORMAL LOW (ref 36.0–46.0)
Hemoglobin: 9.4 g/dL — ABNORMAL LOW (ref 12.0–15.0)
MCH: 27 pg (ref 26.0–34.0)
MCHC: 31.9 g/dL (ref 30.0–36.0)
MCV: 84.8 fL (ref 80.0–100.0)
Platelets: 175 10*3/uL (ref 150–400)
RBC: 3.48 MIL/uL — ABNORMAL LOW (ref 3.87–5.11)
RDW: 17 % — ABNORMAL HIGH (ref 11.5–15.5)
WBC: 7.4 10*3/uL (ref 4.0–10.5)
nRBC: 0 % (ref 0.0–0.2)

## 2022-08-24 MED ORDER — FUROSEMIDE 10 MG/ML IJ SOLN
40.0000 mg | Freq: Four times a day (QID) | INTRAMUSCULAR | Status: AC
  Administered 2022-08-24 (×2): 40 mg via INTRAVENOUS
  Filled 2022-08-24 (×2): qty 4

## 2022-08-24 NOTE — Progress Notes (Signed)
Mobility Specialist Progress Note:   08/24/22 1333  Mobility  Activity Ambulated with assistance in room  Level of Assistance Minimal assist, patient does 75% or more  Assistive Device Front wheel walker  Distance Ambulated (ft) 20 ft  Activity Response Tolerated well  Mobility Referral Yes  $Mobility charge 1 Mobility   Pt received in bed and agreeable. Required MinA to stand from sitting EOB and heavy cues. Pt left in bed with all needs met, call bell in reach, and family in room.   Andrey Campanile Mobility Specialist Please contact via SecureChat or  Rehab office at [phone]

## 2022-08-24 NOTE — Progress Notes (Signed)
PROGRESS NOTE    Melissa [name]  ZJQ:[id number] DOB: [birth year] DOA: 08/13/2022 PCP: Darreld Mclean, MD  Chief Complaint  Patient presents with   Code Sepsis    Brief Narrative:  Melissa [name] is a 88 y.o. female with medical history significant for HFrEF (EF 20-25% per TTE 04/13/2021), NICM, PAF on Eliquis, CKD stage IIIb, insulin-dependent T2DM, HTN, hypothyroidism, HLD, anxiety, delusional parasitosis, mild cognitive impairment on home hospice services who is admitted with paroxysmal atrial fibrillation with RVR and CHF.  Seen by cardiology and started on IV amiodarone drip.  She was started on unasyn with concern for aspiration pneumonia as well.  She developed progressive acute kidney injury and renal was consulted.  Creatinine has plateau'd and is improving, but she's developed worsening CXR findings, rising weights suggestive of HF exacerbation.  Required bipap 11/6 as well as diuresis with lasix.   -Intermittent rapid A-fib, back on IV amiodarone -Required frequent dosing of IV Lasix as well -Per cards not a candidate for advanced therapies, palliative care consulted  Subjective: Continues to have intermittent difficulty breathing, denies any abdominal pain, no nausea or vomiting  Assessment and Plan:  Acute on Chronic Hypoxic Respiratory failure  - on chronic supplemental O2 (3 L [place]),  - treated for PNA and CHF  -CXR with findings concerning for multifocal pna,  s/p 5 days unasyn for possible aspiration.  Required bipap 11/6 for developing resp distress, crackles, and intermittently since - SLP eval -> mild aspiration risk, dysphagia 3/thin liquid diet recommended - pulm toilet, flutter valve -ambulate, wean O2, out of bed to chair -Repeat IV Lasix again today  Acute on Chronic systolic CHF EF 24-09% by last TTE 04/15/5328.  , complicated by AFib RVR, hypotension, AKI, repeat echo-> EF 25%, global hypokinesis, moderately reduced RVSF  -Was under hospice services just prior to  admission and this was cancelled for this admission, per cards she is not a candidate for advanced therapies -given IV lasix intermittently -Toprol on hold with hypotension -GDMT on hold, now on midodrine -Cardiology following, repeat IV Lasix X2 today -Palliative team following, now DNR -Spoke to [place] granddaughter today, family has had further conversations and decided to take her home with hospice services in 1 to 2 days  Paroxysmal atrial fibrillation with rapid ventricular response ([place]) Presenting with A-fib with RVR, rate 140s.  Blood pressure soft.  Cardiology consulted and started IV amiodarone drip -Cardiology following, appreciate assistance - transitioned to PO amiodarone -Hold Toprol-XL with soft blood pressure -back on IV amiodarone -Continue Eliquis 2.5 mg twice daily  AKI on CKD 3a Acute Urinary Retention Creatinine 2.42 on admit compared to baseline around 1.3.  Suspected cardiorenal from hypoperfusion in setting of A-fib with RVR, hypotension -creatinine improving -Renal ultrasound with chronic medical renal disease -s/p indwelling foley catheter placed 11/5 -Foley catheter removed,  monitor for retention  Delirium Suspect this is all due to the acute hospitalization, SOB, hypoxic respiratory failure, improving Delirium precautions -Continue seroquel  Abd pain, constipation - having BMs continue laxatives -LFTs ok, unremarkable KUB and lipase -Improved, Foley removed,  Type 2 diabetes mellitus  -CBGs stable, continue current dose of Insulin  Hypothyroidism, acquired Continue home dose Synthroid 50 mcg daily.   TSH (3.5 wnl).   Hypomagnesemia Replace and follow  Anemia -Mild stable, trend  DVT prophylaxis: eliquis Code Status: Discussed CODE STATUS with the patient this morning 11/10, she is agreeable to DNR and wishes for a natural death Family Communication: None at bedside, will  update daughter Disposition: Home health services   Consultants:   cardiology  Procedures:  Echo IMPRESSIONS     1. Left ventricular ejection fraction, by estimation, is 25%. The left  ventricle has severely decreased function. The left ventricle demonstrates  global hypokinesis. Left ventricular diastolic parameters are  indeterminate.   2. Right ventricular systolic function is moderately reduced. The right  ventricular size is normal. There is normal pulmonary artery systolic  pressure. The estimated right ventricular systolic pressure is 36.6 mmHg.   3. Left atrial size was moderately dilated.   4. The mitral valve is abnormal with restricted posterior leaflet.  Moderate mitral valve regurgitation. No evidence of mitral stenosis.   5. Tricuspid valve regurgitation is mild to moderate.   6. The aortic valve is tricuspid. Aortic valve regurgitation is not  visualized. No aortic stenosis is present.   7. The inferior vena cava is normal in size with <50% respiratory  variability, suggesting right atrial pressure of 8 mmHg.   8. The patient was in atrial fibrillation.    Antimicrobials:  Anti-infectives (From admission, onward)    Start     Dose/Rate Route Frequency Ordered Stop   08/14/22 1915  ampicillin-sulbactam (UNASYN) 1.5 g in sodium chloride 0.9 % 100 mL IVPB        1.5 g 200 mL/hr over 30 Minutes Intravenous Every 12 hours 08/14/22 1817 08/19/22 1929   08/13/22 1923  vancomycin variable dose per unstable renal function (pharmacist dosing)  Status:  Discontinued         Does not apply See admin instructions 08/13/22 1924 08/13/22 2105   08/13/22 1830  vancomycin (VANCOREADY) IVPB 1250 mg/250 mL        1,250 mg 166.7 mL/hr over 90 Minutes Intravenous  Once 08/13/22 1828 08/13/22 2040   08/13/22 1815  ceFEPIme (MAXIPIME) 2 g in sodium chloride 0.9 % 100 mL IVPB        2 g 200 mL/hr over 30 Minutes Intravenous  Once 08/13/22 1804 08/13/22 1844       Objective: Vitals:   08/24/22 0830 08/24/22 1100 08/24/22 1148 08/24/22 1222   BP:  110/65  102/88  Pulse: (!) 105 (!) 101 (!) 112 (!) 105  Resp: (!) '21 17 17 15  '$ Temp:  97.7 F (36.5 C)  97.8 F (36.6 C)  TempSrc:  Axillary  Oral  SpO2: 100% 100% 100% 100%  Weight:      Height:        Intake/Output Summary (Last 24 hours) at 08/24/2022 1252 Last data filed at 08/24/2022 0500 Gross per 24 hour  Intake 435.81 ml  Output 1025 ml  Net -589.19 ml   Filed Weights   08/21/22 0415 08/22/22 0300 08/23/22 0342  Weight: 70.2 kg 68.7 kg 72.4 kg    Examination:  General: Elderly chronically ill female sitting up in bed, AAO x3 HEENT: Positive JVD CVS: S1-S2, irregularly irregular rhythm Lungs: Fine bilateral rales Abdomen: Soft, nontender, bowel sounds present Extremities: 1+ edema   Data Reviewed: I have personally reviewed following labs and imaging studies  CBC: Recent Labs  Lab 08/18/22 0021 08/19/22 0017 08/20/22 0734 08/21/22 0027 08/22/22 0029 08/23/22 0021 08/24/22 0019  WBC 4.4 5.3 6.4 6.5 6.0 6.2 7.4  NEUTROABS 2.5 4.4 4.5 4.6  --   --   --   HGB 8.5* 8.6* 9.5* 9.7* 10.3* 9.3* 9.4*  HCT 27.3* 28.3* 30.2* 29.9* 33.2* 29.2* 29.5*  MCV 85.8 87.9 85.8 85.4 86.7 86.1 84.8  PLT  145* 170 180 186 172 164 161    Basic Metabolic Panel: Recent Labs  Lab 08/18/22 0021 08/19/22 0017 08/20/22 0734 08/21/22 0027 08/22/22 0029 08/23/22 0021 08/24/22 0019  NA 134* 132* 135 134* 131* 130* 126*  K 4.2 4.5 4.3 4.5 4.6 4.6 4.7  CL 102 101 99 99 95* 92* 88*  CO2 '25 25 27 26 30 29 29  '$ GLUCOSE 81 145* 136* 125* 132* 122* 154*  BUN 51* 46* 42* 38* 34* 32* 28*  CREATININE 3.19* 2.55* 2.05* 1.84* 1.93* 1.80* 1.78*  CALCIUM 8.1* 8.2* 8.5* 8.6* 8.9 8.5* 8.4*  MG 2.8* 2.6*  --  2.3  --   --   --   PHOS 5.0* 4.6  --  3.1  --   --   --     GFR: Estimated Creatinine Clearance: 19.7 mL/min (A) (by C-G formula based on SCr of 1.78 mg/dL (H)).  Liver Function Tests: Recent Labs  Lab 08/19/22 0017 08/20/22 0734 08/21/22 0027 08/22/22 0029  08/23/22 0021  AST 12* 11* 10* 13* 12*  ALT '10 13 12 13 10  '$ ALKPHOS 47 50 48 52 48  BILITOT 0.8 0.4 0.6 0.4 0.2*  PROT 6.1* 6.4* 5.9* 6.3* 5.7*  ALBUMIN 2.7* 3.0* 2.9* 3.1* 2.9*    CBG: Recent Labs  Lab 08/23/22 1109 08/23/22 1648 08/23/22 2105 08/24/22 0655 08/24/22 1221  GLUCAP 181* 191* 180* 145* 157*     Recent Results (from the past 240 hour(s))  Respiratory (~20 pathogens) panel by PCR     Status: None   Collection Time: 08/19/22 10:10 AM   Specimen: Nasopharyngeal Swab; Respiratory  Result Value Ref Range Status   Adenovirus NOT DETECTED NOT DETECTED Final   Coronavirus 229E NOT DETECTED NOT DETECTED Final    Comment: (NOTE) The Coronavirus on the Respiratory Panel, DOES NOT test for the novel  Coronavirus (2019 nCoV)    Coronavirus HKU1 NOT DETECTED NOT DETECTED Final   Coronavirus NL63 NOT DETECTED NOT DETECTED Final   Coronavirus OC43 NOT DETECTED NOT DETECTED Final   Metapneumovirus NOT DETECTED NOT DETECTED Final   Rhinovirus / Enterovirus NOT DETECTED NOT DETECTED Final   Influenza A NOT DETECTED NOT DETECTED Final   Influenza B NOT DETECTED NOT DETECTED Final   Parainfluenza Virus 1 NOT DETECTED NOT DETECTED Final   Parainfluenza Virus 2 NOT DETECTED NOT DETECTED Final   Parainfluenza Virus 3 NOT DETECTED NOT DETECTED Final   Parainfluenza Virus 4 NOT DETECTED NOT DETECTED Final   Respiratory Syncytial Virus NOT DETECTED NOT DETECTED Final   Bordetella pertussis NOT DETECTED NOT DETECTED Final   Bordetella Parapertussis NOT DETECTED NOT DETECTED Final   Chlamydophila pneumoniae NOT DETECTED NOT DETECTED Final   Mycoplasma pneumoniae NOT DETECTED NOT DETECTED Final    Comment: Performed at [facility], [place] [address] [place], [place] [address]  SARS Coronavirus 2 by RT PCR (hospital order, performed in [facility] hospital lab) *cepheid single result test*     Status: None   Collection Time: 08/19/22 10:10 AM  Result Value Ref Range Status    SARS Coronavirus 2 by RT PCR NEGATIVE NEGATIVE Final    Comment: (NOTE) SARS-CoV-2 target nucleic acids are NOT DETECTED.  The SARS-CoV-2 RNA is generally detectable in upper and lower respiratory specimens during the acute phase of infection. The lowest concentration of SARS-CoV-2 viral copies this assay can detect is 250 copies / mL. A negative result does not preclude SARS-CoV-2 infection and should not be  used as the sole basis for treatment or other patient management decisions.  A negative result may occur with improper specimen collection / handling, submission of specimen other than nasopharyngeal swab, presence of viral mutation(s) within the areas targeted by this assay, and inadequate number of viral copies (<250 copies / mL). A negative result must be combined with clinical observations, patient history, and epidemiological information.  Fact Sheet for Patients:   https://www.patel.info/  Fact Sheet for Healthcare Providers: https://hall.com/  This test is not yet approved or  cleared by the [place] FDA and has been authorized for detection and/or diagnosis of SARS-CoV-2 by FDA under an Emergency Use Authorization (EUA).  This EUA will remain in effect (meaning this test can be used) for the duration of the COVID-19 declaration under Section 564(b)(1) of the Act, 21 U.S.C. section 360bbb-3(b)(1), unless the authorization is terminated or revoked sooner.  Performed at [facility], [place] [address] [place], [place] [address]     Scheduled Meds:  apixaban  2.5 mg Oral BID   calcium carbonate  1 tablet Oral BID   Chlorhexidine Gluconate Cloth  6 each Topical Q0600   FLUoxetine  20 mg Oral Q breakfast   furosemide  40 mg Intravenous Q6H   insulin aspart  0-9 Units Subcutaneous TID WC   insulin detemir  9 Units Subcutaneous Daily   ipratropium-albuterol  3 mL Nebulization BID   levothyroxine  50 mcg Oral QAC  breakfast   midodrine  5 mg Oral TID WC   polyethylene glycol  17 g Oral BID   pramipexole  0.5 mg Oral QHS   QUEtiapine  100 mg Oral QHS   senna-docusate  1 tablet Oral QHS   sodium chloride flush  3 mL Intravenous Q12H   Continuous Infusions:  amiodarone 30 mg/hr (08/24/22 0848)      LOS: 11 days    Time spent: 53mn  PDomenic Polite MD [facility]   08/24/2022, 12:52 PM

## 2022-08-24 NOTE — Plan of Care (Signed)
  Problem: Activity: Goal: Ability to tolerate increased activity will improve Outcome: Progressing   Problem: Metabolic: Goal: Ability to maintain appropriate glucose levels will improve Outcome: Progressing   Problem: Clinical Measurements: Goal: Respiratory complications will improve Outcome: Progressing Goal: Cardiovascular complication will be avoided Outcome: Progressing   Problem: Coping: Goal: Level of anxiety will decrease Outcome: Progressing   Problem: Pain Managment: Goal: General experience of comfort will improve Outcome: Progressing   Problem: Safety: Goal: Ability to remain free from injury will improve Outcome: Progressing

## 2022-08-24 NOTE — Progress Notes (Signed)
Patient placed on Bipap per patient request to sleep. Family at bedside.

## 2022-08-24 NOTE — Plan of Care (Signed)
  Problem: Education: Goal: Knowledge of disease or condition will improve Outcome: Progressing Goal: Understanding of medication regimen will improve Outcome: Progressing   Problem: Health Behavior/Discharge Planning: Goal: Ability to safely manage health-related needs after discharge will improve Outcome: Progressing   Problem: Coping: Goal: Ability to adjust to condition or change in health will improve Outcome: Progressing   Problem: Skin Integrity: Goal: Risk for impaired skin integrity will decrease Outcome: Progressing   Problem: Tissue Perfusion: Goal: Adequacy of tissue perfusion will improve Outcome: Progressing

## 2022-08-24 NOTE — Progress Notes (Addendum)
Progress Note  Patient Name: Melissa [name] Date of Encounter: 08/24/2022  Primary Cardiologist: [name], MD  Subjective   No acute events overnight. Patient states she is feeling better but still has shortness of shortness of breath.  Family at bedside, stated that patient has been eating well at home but has no appetite after she was admitted to hospital this time.  Inpatient Medications    Scheduled Meds:  apixaban  2.5 mg Oral BID   calcium carbonate  1 tablet Oral BID   Chlorhexidine Gluconate Cloth  6 each Topical Q0600   FLUoxetine  20 mg Oral Q breakfast   furosemide  40 mg Intravenous Q6H   insulin aspart  0-9 Units Subcutaneous TID WC   insulin detemir  9 Units Subcutaneous Daily   ipratropium-albuterol  3 mL Nebulization BID   levothyroxine  50 mcg Oral QAC breakfast   midodrine  5 mg Oral TID WC    morphine injection  1 mg Intravenous Once   polyethylene glycol  17 g Oral BID   pramipexole  0.5 mg Oral QHS   QUEtiapine  100 mg Oral QHS   senna-docusate  1 tablet Oral QHS   sodium chloride flush  3 mL Intravenous Q12H   Continuous Infusions:  amiodarone 30 mg/hr (08/24/22 0848)   PRN Meds: acetaminophen **OR** acetaminophen, guaiFENesin-dextromethorphan, levalbuterol, morphine injection, ondansetron **OR** ondansetron (ZOFRAN) IV, mouth rinse   Vital Signs    Vitals:   08/24/22 0200 08/24/22 0400 08/24/22 0735 08/24/22 0830  BP: 116/69 104/75 121/72   Pulse:  100 (!) 107 (!) 105  Resp:  19 18 (!) 21  Temp:  97.7 F (36.5 C) 97.8 F (36.6 C)   TempSrc:  Axillary Oral   SpO2:  100% 90% 100%  Weight:      Height:        Intake/Output Summary (Last 24 hours) at 08/24/2022 0939 Last data filed at 08/24/2022 0500 Gross per 24 hour  Intake 435.81 ml  Output 1325 ml  Net -889.19 ml   Filed Weights   08/21/22 0415 08/22/22 0300 08/23/22 0342  Weight: 70.2 kg 68.7 kg 72.4 kg    Telemetry     Personally reviewed, A-fib with RVR and heart  rates are controlled from 9200s.  ECG    An ECG dated 08/16/2022 20 was personally reviewed today and demonstrated:  A-fib  Physical Exam   GEN: No acute distress.   Neck: JVD to the angle of mandible Cardiac: RRR, no murmur, rub, or gallop.  Respiratory: Bilateral Rales positive GI: Soft, nontender, bowel sounds present. MS: Trace pitting edema in the lower extremities; No deformity. Neuro:  Nonfocal. Psych: Alert and oriented x 3. Normal affect.  Labs    Chemistry Recent Labs  Lab 08/21/22 0027 08/22/22 0029 08/23/22 0021 08/24/22 0019  NA 134* 131* 130* 126*  K 4.5 4.6 4.6 4.7  CL 99 95* 92* 88*  CO2 '26 30 29 29  '$ GLUCOSE 125* 132* 122* 154*  BUN 38* 34* 32* 28*  CREATININE 1.84* 1.93* 1.80* 1.78*  CALCIUM 8.6* 8.9 8.5* 8.4*  PROT 5.9* 6.3* 5.7*  --   ALBUMIN 2.9* 3.1* 2.9*  --   AST 10* 13* 12*  --   ALT '12 13 10  '$ --   ALKPHOS 48 52 48  --   BILITOT 0.6 0.4 0.2*  --   GFRNONAA 25* 24* 26* 26*  ANIONGAP '9 6 9 9     '$ Hematology Recent Labs  Lab 08/22/22 0029 08/23/22 0021 08/24/22 0019  WBC 6.0 6.2 7.4  RBC 3.83* 3.39* 3.48*  HGB 10.3* 9.3* 9.4*  HCT 33.2* 29.2* 29.5*  MCV 86.7 86.1 84.8  MCH 26.9 27.4 27.0  MCHC 31.0 31.8 31.9  RDW 17.6* 17.4* 17.0*  PLT 172 164 175    Cardiac Enzymes Recent Labs  Lab 08/13/22 1957 08/16/22 1535 08/16/22 1806  TROPONINIHS '12 14 13    '$ BNP Recent Labs  Lab 08/19/22 0017  BNP 1,406.9*     DDimerNo results for input(s): "DDIMER" in the last 168 hours.   Radiology    No results found.  Cardiac Studies  Echo in November 2023 LVEF 70% RV systolic function is moderately reduced Moderate MR No aortic valve stenosis   Assessment & Plan   Patient is a 88 year old F known to have NICM LVEF 20 to 25% with no device, A-fib status post DCCV in 2022, DM 2 was admitted with acute on chronic hypoxic respiratory failure likely secondary to CHF/pneumonia.  #Acute on chronic systolic heart failure  exacerbation -Patient has elevated JVD to the angle of mandible, bilateral rales and trace pitting edema. Agree with aggressive diuresis, continue Lasix IV 40 mg every 6 hours. Goal MAP of 65. If soft blood pressures are of concern, can start her on Lasix drip instead of insulin bolus doses. Obtain CMP every 12h. Keep K>4 and <5 and Mg>2 and <3. -Hold BB -Hold ACEi/ARB/ARNI/MRA/SGLT2 inhibitors due to kidney dysfunction -Daily weights, ins and outs and 1.2 L fluid restriction  #Paroxysmal A-fib with RVR likely secondary to ADHF -Telemetry last 24 hours showed A-fib, heart rates controlled 90-100s.  Continue amiodarone drip for now. I anticipate her heart rates will be better controlled after diuresis. -Continue Eliquis 2.5 mg twice daily  #AKI on CKD stage IIIb versus CKD progression -Follow-up nephrology recommendations -Treat ADHF as above  #Hyponatremia, likely secondary to hypervolemia -Treat ADHF as above and follow-up on CMP  I have spent a total of 36 minutes with patient reviewing chart , telemetry, EKGs, labs and examining patient as well as establishing an assessment and plan that was discussed with the patient.  > 50% of time was spent in direct patient care.      Signed, Chalmers Guest, MD  08/24/2022, 9:39 AM

## 2022-08-25 DIAGNOSIS — I480 Paroxysmal atrial fibrillation: Secondary | ICD-10-CM | POA: Diagnosis not present

## 2022-08-25 DIAGNOSIS — I5022 Chronic systolic (congestive) heart failure: Secondary | ICD-10-CM | POA: Diagnosis not present

## 2022-08-25 DIAGNOSIS — I5043 Acute on chronic combined systolic (congestive) and diastolic (congestive) heart failure: Secondary | ICD-10-CM | POA: Diagnosis not present

## 2022-08-25 DIAGNOSIS — Z515 Encounter for palliative care: Secondary | ICD-10-CM | POA: Diagnosis not present

## 2022-08-25 DIAGNOSIS — I4891 Unspecified atrial fibrillation: Secondary | ICD-10-CM

## 2022-08-25 DIAGNOSIS — N179 Acute kidney failure, unspecified: Secondary | ICD-10-CM | POA: Diagnosis not present

## 2022-08-25 DIAGNOSIS — Z7189 Other specified counseling: Secondary | ICD-10-CM

## 2022-08-25 LAB — BASIC METABOLIC PANEL
Anion gap: 9 (ref 5–15)
BUN: 25 mg/dL — ABNORMAL HIGH (ref 8–23)
CO2: 31 mmol/L (ref 22–32)
Calcium: 8.5 mg/dL — ABNORMAL LOW (ref 8.9–10.3)
Chloride: 85 mmol/L — ABNORMAL LOW (ref 98–111)
Creatinine, Ser: 1.59 mg/dL — ABNORMAL HIGH (ref 0.44–1.00)
GFR, Estimated: 30 mL/min — ABNORMAL LOW (ref >60)
Glucose, Bld: 120 mg/dL — ABNORMAL HIGH (ref 70–99)
Potassium: 3.9 mmol/L (ref 3.5–5.1)
Sodium: 125 mmol/L — ABNORMAL LOW (ref 135–145)

## 2022-08-25 LAB — GLUCOSE, CAPILLARY
Glucose-Capillary: 121 mg/dL — ABNORMAL HIGH (ref 70–99)
Glucose-Capillary: 175 mg/dL — ABNORMAL HIGH (ref 70–99)

## 2022-08-25 MED ORDER — LORAZEPAM 2 MG/ML IJ SOLN: 0.5000 mg | INTRAMUSCULAR | Status: DC | PRN

## 2022-08-25 MED ORDER — LORAZEPAM 2 MG/ML IJ SOLN
0.5000 mg | INTRAMUSCULAR | Status: DC
  Administered 2022-08-25 – 2022-08-27 (×9): 0.5 mg via INTRAVENOUS
  Filled 2022-08-25 (×10): qty 1

## 2022-08-25 MED ORDER — POLYVINYL ALCOHOL 1.4 % OP SOLN: 1.0000 [drp] | Freq: Four times a day (QID) | OPHTHALMIC | Status: DC | PRN

## 2022-08-25 MED ORDER — IPRATROPIUM-ALBUTEROL 0.5-2.5 (3) MG/3ML IN SOLN
3.0000 mL | Freq: Four times a day (QID) | RESPIRATORY_TRACT | Status: DC | PRN
  Administered 2022-08-26: 3 mL via RESPIRATORY_TRACT
  Filled 2022-08-25: qty 3

## 2022-08-25 MED ORDER — BIOTENE DRY MOUTH MT LIQD: 15.0000 mL | OROMUCOSAL | Status: DC | PRN

## 2022-08-25 MED ORDER — FUROSEMIDE 10 MG/ML IJ SOLN
40.0000 mg | Freq: Four times a day (QID) | INTRAMUSCULAR | Status: AC
  Administered 2022-08-25 (×2): 40 mg via INTRAVENOUS
  Filled 2022-08-25 (×2): qty 4

## 2022-08-25 MED ORDER — HYDROMORPHONE HCL 1 MG/ML IJ SOLN
0.5000 mg | INTRAMUSCULAR | Status: DC | PRN
  Administered 2022-08-26: 0.5 mg via INTRAVENOUS

## 2022-08-25 MED ORDER — HYDROMORPHONE HCL 1 MG/ML IJ SOLN
0.5000 mg | INTRAMUSCULAR | Status: DC
  Administered 2022-08-25 – 2022-08-27 (×9): 0.5 mg via INTRAVENOUS
  Filled 2022-08-25 (×11): qty 0.5

## 2022-08-25 NOTE — Progress Notes (Signed)
Manufacturing engineer [facility])  Request received for [employer].  Confirmed interest with family, eligibility pending and we currently do not have an open bed.  [facility] will update hospital and family on Monday.  Venia Carbon DNP, RN [facility] Liaison

## 2022-08-25 NOTE — Progress Notes (Signed)
Progress Note  Patient Name: Melissa [name] Date of Encounter: 08/25/2022  Primary Cardiologist: [name], MD  Subjective   No acute events overnight. Patient is resting.  Per primary team and family, patient is for home hospice in early next week.  Inpatient Medications    Scheduled Meds:  apixaban  2.5 mg Oral BID   calcium carbonate  1 tablet Oral BID   FLUoxetine  20 mg Oral Q breakfast   furosemide  40 mg Intravenous Q6H   insulin aspart  0-9 Units Subcutaneous TID WC   insulin detemir  9 Units Subcutaneous Daily   ipratropium-albuterol  3 mL Nebulization BID   levothyroxine  50 mcg Oral QAC breakfast   midodrine  5 mg Oral TID WC   polyethylene glycol  17 g Oral BID   pramipexole  0.5 mg Oral QHS   QUEtiapine  100 mg Oral QHS   senna-docusate  1 tablet Oral QHS   sodium chloride flush  3 mL Intravenous Q12H   Continuous Infusions:  amiodarone 30 mg/hr (08/25/22 0730)   PRN Meds: acetaminophen **OR** acetaminophen, guaiFENesin-dextromethorphan, levalbuterol, morphine injection, ondansetron **OR** ondansetron (ZOFRAN) IV, mouth rinse   Vital Signs    Vitals:   08/25/22 0304 08/25/22 0755 08/25/22 0830 08/25/22 0853  BP:  (!) 120/59 (!) 120/59   Pulse:  (!) 102 (!) 104 97  Resp:  20 (!) 23 17  Temp:  97.8 F (36.6 C)    TempSrc:  Oral    SpO2:  100% 100% 100%  Weight: 71.5 kg     Height:        Intake/Output Summary (Last 24 hours) at 08/25/2022 1057 Last data filed at 08/25/2022 0500 Gross per 24 hour  Intake 420.42 ml  Output 1450 ml  Net -1029.58 ml   Filed Weights   08/22/22 0300 08/23/22 0342 08/25/22 0304  Weight: 68.7 kg 72.4 kg 71.5 kg    Telemetry     Personally reviewed, A-fib with RVR and heart rates are controlled from 9200s.  ECG    An ECG dated 08/16/2022 20 was personally reviewed today and demonstrated:  A-fib  Physical Exam   GEN: No acute distress.   Neck: JVD to the angle of mandible Cardiac: RRR, no murmur, rub, or  gallop.  Respiratory: Bilateral Rales positive GI: Soft, nontender, bowel sounds present. MS: Trace pitting edema in the lower extremities; No deformity. Neuro:  Nonfocal. Psych: Alert and oriented x 3. Normal affect.  Labs    Chemistry Recent Labs  Lab 08/21/22 0027 08/22/22 0029 08/23/22 0021 08/24/22 0019 08/25/22 0012  NA 134* 131* 130* 126* 125*  K 4.5 4.6 4.6 4.7 3.9  CL 99 95* 92* 88* 85*  CO2 '26 30 29 29 31  '$ GLUCOSE 125* 132* 122* 154* 120*  BUN 38* 34* 32* 28* 25*  CREATININE 1.84* 1.93* 1.80* 1.78* 1.59*  CALCIUM 8.6* 8.9 8.5* 8.4* 8.5*  PROT 5.9* 6.3* 5.7*  --   --   ALBUMIN 2.9* 3.1* 2.9*  --   --   AST 10* 13* 12*  --   --   ALT '12 13 10  '$ --   --   ALKPHOS 48 52 48  --   --   BILITOT 0.6 0.4 0.2*  --   --   GFRNONAA 25* 24* 26* 26* 30*  ANIONGAP '9 6 9 9 9     '$ Hematology Recent Labs  Lab 08/22/22 0029 08/23/22 0021 08/24/22 0019  WBC 6.0  6.2 7.4  RBC 3.83* 3.39* 3.48*  HGB 10.3* 9.3* 9.4*  HCT 33.2* 29.2* 29.5*  MCV 86.7 86.1 84.8  MCH 26.9 27.4 27.0  MCHC 31.0 31.8 31.9  RDW 17.6* 17.4* 17.0*  PLT 172 164 175    Cardiac Enzymes Recent Labs  Lab 08/13/22 1957 08/16/22 1535 08/16/22 1806  TROPONINIHS '12 14 13    '$ BNP Recent Labs  Lab 08/19/22 0017  BNP 1,406.9*     DDimerNo results for input(s): "DDIMER" in the last 168 hours.   Radiology    No results found.  Cardiac Studies  Echo in November 2023 LVEF 99% RV systolic function is moderately reduced Moderate MR No aortic valve stenosis   Assessment & Plan   Patient is a 88 year old F known to have NICM LVEF 20 to 25% with no device, A-fib status post DCCV in 2022, DM 2 was admitted with acute on chronic hypoxic respiratory failure likely secondary to CHF/pneumonia.  #Acute on chronic systolic heart failure exacerbation -Per conversation with primary team attending and family, patient is for home hospice in early next week. Continue diuresis with Lasix and further  recommendations of Lasix depending on the patient/family goals of care. -Hold BP -Hold ACEi/ARB/ARNI/MRA/SGLT2 inhibitors due to kidney dysfunction -Daily weights, ins and outs and 1.2 L fluid restriction  #Paroxysmal A-fib with RVR likely secondary to ADHF -Since patient is home hospice, further medication recommendations will be based on hospice team.  #AKI on CKD stage IIIb versus CKD progression -Follow-up on nephrology recommendations -Treat ADHF as above  I have spent a total of 36 minutes with patient reviewing chart , telemetry, EKGs, labs and examining patient as well as establishing an assessment and plan that was discussed with the patient.  > 50% of time was spent in direct patient care.   [facility] will sign off.   Medication Recommendations: Up to the hospice team Other recommendations (labs, testing, etc): None Follow up as an outpatient: Not needed unless hospice care is reverted    Signed, Persephonie Hegwood P Tenna Lacko, MD  08/25/2022, 10:57 AM

## 2022-08-25 NOTE — Plan of Care (Signed)
Problem: Education: Goal: Knowledge of disease or condition will improve Outcome: Progressing Goal: Understanding of medication regimen will improve Outcome: Progressing Goal: Individualized Educational Video(s) Outcome: Progressing   Problem: Activity: Goal: Ability to tolerate increased activity will improve Outcome: Progressing   Problem: Cardiac: Goal: Ability to achieve and maintain adequate cardiopulmonary perfusion will improve Outcome: Progressing   Problem: Health Behavior/Discharge Planning: Goal: Ability to safely manage health-related needs after discharge will improve Outcome: Progressing   Problem: Education: Goal: Ability to describe self-care measures that may prevent or decrease complications (Diabetes Survival Skills Education) will improve Outcome: Progressing Goal: Individualized Educational Video(s) Outcome: Progressing   Problem: Coping: Goal: Ability to adjust to condition or change in health will improve Outcome: Progressing   Problem: Fluid Volume: Goal: Ability to maintain a balanced intake and output will improve Outcome: Progressing   Problem: Health Behavior/Discharge Planning: Goal: Ability to identify and utilize available resources and services will improve Outcome: Progressing Goal: Ability to manage health-related needs will improve Outcome: Progressing   Problem: Metabolic: Goal: Ability to maintain appropriate glucose levels will improve Outcome: Progressing   Problem: Nutritional: Goal: Maintenance of adequate nutrition will improve Outcome: Progressing Goal: Progress toward achieving an optimal weight will improve Outcome: Progressing   Problem: Skin Integrity: Goal: Risk for impaired skin integrity will decrease Outcome: Progressing   Problem: Tissue Perfusion: Goal: Adequacy of tissue perfusion will improve Outcome: Progressing   Problem: Education: Goal: Knowledge of General Education information will  improve Description: Including pain rating scale, medication(s)/side effects and non-pharmacologic comfort measures Outcome: Progressing   Problem: Health Behavior/Discharge Planning: Goal: Ability to manage health-related needs will improve Outcome: Progressing   Problem: Clinical Measurements: Goal: Ability to maintain clinical measurements within normal limits will improve Outcome: Progressing Goal: Will remain free from infection Outcome: Progressing Goal: Diagnostic test results will improve Outcome: Progressing Goal: Respiratory complications will improve Outcome: Progressing Goal: Cardiovascular complication will be avoided Outcome: Progressing   Problem: Activity: Goal: Risk for activity intolerance will decrease Outcome: Progressing   Problem: Nutrition: Goal: Adequate nutrition will be maintained Outcome: Progressing   Problem: Coping: Goal: Level of anxiety will decrease Outcome: Progressing   Problem: Elimination: Goal: Will not experience complications related to bowel motility Outcome: Progressing Goal: Will not experience complications related to urinary retention Outcome: Progressing   Problem: Pain Managment: Goal: General experience of comfort will improve Outcome: Progressing   Problem: Safety: Goal: Ability to remain free from injury will improve Outcome: Progressing   Problem: Skin Integrity: Goal: Risk for impaired skin integrity will decrease Outcome: Progressing   Problem: Education: Goal: Knowledge of the prescribed therapeutic regimen will improve Outcome: Progressing   Problem: Coping: Goal: Ability to identify and develop effective coping behavior will improve Outcome: Progressing   Problem: Clinical Measurements: Goal: Quality of life will improve Outcome: Progressing   Problem: Respiratory: Goal: Verbalizations of increased ease of respirations will increase Outcome: Progressing   Problem: Role Relationship: Goal:  Family's ability to cope with current situation will improve Outcome: Progressing Goal: Ability to verbalize concerns, feelings, and thoughts to partner or family member will improve Outcome: Progressing   Problem: Pain Management: Goal: Satisfaction with pain management regimen will improve Outcome: Progressing   Problem: Education: Goal: Knowledge of the prescribed therapeutic regimen will improve Outcome: Progressing   Problem: Coping: Goal: Ability to identify and develop effective coping behavior will improve Outcome: Progressing   Problem: Clinical Measurements: Goal: Quality of life will improve Outcome: Progressing   Problem: Respiratory: Goal: Verbalizations  of increased ease of respirations will increase Outcome: Progressing

## 2022-08-25 NOTE — Progress Notes (Signed)
Patient placed on Bipap per patient and family request to rest.  Patient tolerating well at this time.

## 2022-08-25 NOTE — Progress Notes (Signed)
PROGRESS NOTE    LOA IDLER  BDZ:[id number] DOB: 01-17-1928 DOA: 08/13/2022 PCP: Darreld Mclean, MD  Chief Complaint  Patient presents with   Code Sepsis    Brief Narrative:  ARIEN [name] is a 88 y.o. female with medical history significant for HFrEF (EF 20-25% per TTE 04/13/2021), NICM, PAF on Eliquis, CKD stage IIIb, insulin-dependent T2DM, HTN, hypothyroidism, HLD, anxiety, delusional parasitosis, mild cognitive impairment on home hospice services who was admitted with paroxysmal atrial fibrillation with RVR and CHF.  Seen by cardiology and started on IV amiodarone drip.  She was started on unasyn with concern for aspiration pneumonia as well.  She developed progressive acute kidney injury , creatinine has plateau'd and is improving, but she's developed worsening CXR findings, rising weights suggestive of HF exacerbation.  Required bipap 11/6 as well as diuresis with lasix.   -Intermittent rapid A-fib, back on IV amiodarone -Required frequent dosing of IV Lasix as well as BiPAP -Per cards not a candidate for advanced therapies, palliative care consulted  Subjective: Feels fair today, breathing overall better, denies any abdominal pain today, required I/O cath overnight X1  Assessment and Plan:  Acute on Chronic Hypoxic Respiratory failure  - on chronic supplemental O2 (3 L [place]),  - treated for PNA and CHF  -CXR with findings concerning for multifocal pna,  s/p 5 days unasyn for possible aspiration.  Required bipap 11/6 for developing resp distress, crackles, and intermittently since - SLP eval -> mild aspiration risk, dysphagia 3/thin liquid diet recommended - pulm toilet, flutter valve -ambulate, wean O2, out of bed to chair -Appears volume overloaded again, repeat IV Lasix X2 doses today  Acute on Chronic systolic CHF EF 34-19% by last TTE 03/15/2296.  , complicated by AFib RVR, hypotension, AKI, repeat echo-> EF 25%, global hypokinesis, moderately reduced RVSF  -Was under  hospice services just prior to admission and this was cancelled for this admission, per cards she is not a candidate for advanced therapies -given IV lasix intermittently -Toprol and GDMT limited by hypotension, -Continue midodrine -Cardiology following, repeat IV Lasix X2 today -Palliative team following, now DNR -Spoke to [name] granddaughter yesterday, family has had further conversations and decided to take her home with hospice services in 1 to 2 days, anticipate DC likely Monday  Paroxysmal atrial fibrillation with rapid ventricular response ([place]) Presenting with A-fib with RVR, rate 140s.  Blood pressure soft.  Cardiology consulted and started IV amiodarone drip -Cardiology following, appreciate assistance - transitioned to PO amiodarone -Hold Toprol-XL with soft blood pressure -back on IV amiodarone, switch to p.o. amiodarone at discharge -Continue Eliquis 2.5 mg twice daily  AKI on CKD 3a Acute Urinary Retention Creatinine 2.42 on admit compared to baseline around 1.3.  Suspected cardiorenal from hypoperfusion in setting of A-fib with RVR, hypotension -creatinine improved and stable now -Renal ultrasound with chronic medical renal disease -s/p indwelling foley catheter placed 11/5 -Foley catheter removed,  monitor for retention  Delirium Suspect this is all due to the acute hospitalization, SOB, hypoxic respiratory failure, improving Delirium precautions -Continue seroquel  Abd pain, constipation - having BMs continue laxatives -LFTs ok, unremarkable KUB and lipase -Improved, Foley removed,  Type 2 diabetes mellitus  -CBGs stable, continue current dose of Insulin  Hypothyroidism, acquired Continue home dose Synthroid 50 mcg daily.   TSH (3.5 wnl).   Hypomagnesemia Replace and follow  Anemia -Mild stable, trend  DVT prophylaxis: eliquis Code Status: Discussed CODE STATUS with the patient this morning 11/10, she is  agreeable to DNR and wishes for a natural  death Family Communication: Called and granddaughter 11/12 Disposition: Home with hospice services Monday or Tuesday   Consultants:  cardiology  Procedures:  Echo IMPRESSIONS     1. Left ventricular ejection fraction, by estimation, is 25%. The left  ventricle has severely decreased function. The left ventricle demonstrates  global hypokinesis. Left ventricular diastolic parameters are  indeterminate.   2. Right ventricular systolic function is moderately reduced. The right  ventricular size is normal. There is normal pulmonary artery systolic  pressure. The estimated right ventricular systolic pressure is 47.8 mmHg.   3. Left atrial size was moderately dilated.   4. The mitral valve is abnormal with restricted posterior leaflet.  Moderate mitral valve regurgitation. No evidence of mitral stenosis.   5. Tricuspid valve regurgitation is mild to moderate.   6. The aortic valve is tricuspid. Aortic valve regurgitation is not  visualized. No aortic stenosis is present.   7. The inferior vena cava is normal in size with <50% respiratory  variability, suggesting right atrial pressure of 8 mmHg.   8. The patient was in atrial fibrillation.    Antimicrobials:  Anti-infectives (From admission, onward)    Start     Dose/Rate Route Frequency Ordered Stop   08/14/22 1915  ampicillin-sulbactam (UNASYN) 1.5 g in sodium chloride 0.9 % 100 mL IVPB        1.5 g 200 mL/hr over 30 Minutes Intravenous Every 12 hours 08/14/22 1817 08/19/22 1929   08/13/22 1923  vancomycin variable dose per unstable renal function (pharmacist dosing)  Status:  Discontinued         Does not apply See admin instructions 08/13/22 1924 08/13/22 2105   08/13/22 1830  vancomycin (VANCOREADY) IVPB 1250 mg/250 mL        1,250 mg 166.7 mL/hr over 90 Minutes Intravenous  Once 08/13/22 1828 08/13/22 2040   08/13/22 1815  ceFEPIme (MAXIPIME) 2 g in sodium chloride 0.9 % 100 mL IVPB        2 g 200 mL/hr over 30 Minutes  Intravenous  Once 08/13/22 1804 08/13/22 1844       Objective: Vitals:   08/25/22 0304 08/25/22 0755 08/25/22 0830 08/25/22 0853  BP:  (!) 120/59 (!) 120/59   Pulse:  (!) 102 (!) 104 97  Resp:  20 (!) 23 17  Temp:  97.8 F (36.6 C)    TempSrc:  Oral    SpO2:  100% 100% 100%  Weight: 71.5 kg     Height:        Intake/Output Summary (Last 24 hours) at 08/25/2022 1013 Last data filed at 08/25/2022 0500 Gross per 24 hour  Intake 420.42 ml  Output 1450 ml  Net -1029.58 ml   Filed Weights   08/22/22 0300 08/23/22 0342 08/25/22 0304  Weight: 68.7 kg 72.4 kg 71.5 kg    Examination:  General: Early chronically ill female sitting up in bed, AAOx3 HEENT: Positive JVD CVS: S1-S2, irregularly irregular rhythm Lungs: Fine bilateral rales Abdomen: Soft, nontender, bowel sounds present Extremities: Trace edema   Data Reviewed: I have personally reviewed following labs and imaging studies  CBC: Recent Labs  Lab 08/19/22 0017 08/20/22 0734 08/21/22 0027 08/22/22 0029 08/23/22 0021 08/24/22 0019  WBC 5.3 6.4 6.5 6.0 6.2 7.4  NEUTROABS 4.4 4.5 4.6  --   --   --   HGB 8.6* 9.5* 9.7* 10.3* 9.3* 9.4*  HCT 28.3* 30.2* 29.9* 33.2* 29.2* 29.5*  MCV 87.9  85.8 85.4 86.7 86.1 84.8  PLT 170 180 186 172 164 165    Basic Metabolic Panel: Recent Labs  Lab 08/19/22 0017 08/20/22 0734 08/21/22 0027 08/22/22 0029 08/23/22 0021 08/24/22 0019 08/25/22 0012  NA 132*   < > 134* 131* 130* 126* 125*  K 4.5   < > 4.5 4.6 4.6 4.7 3.9  CL 101   < > 99 95* 92* 88* 85*  CO2 25   < > '26 30 29 29 31  '$ GLUCOSE 145*   < > 125* 132* 122* 154* 120*  BUN 46*   < > 38* 34* 32* 28* 25*  CREATININE 2.55*   < > 1.84* 1.93* 1.80* 1.78* 1.59*  CALCIUM 8.2*   < > 8.6* 8.9 8.5* 8.4* 8.5*  MG 2.6*  --  2.3  --   --   --   --   PHOS 4.6  --  3.1  --   --   --   --    < > = values in this interval not displayed.    GFR: Estimated Creatinine Clearance: 21.9 mL/min (A) (by C-G formula based on SCr of  1.59 mg/dL (H)).  Liver Function Tests: Recent Labs  Lab 08/19/22 0017 08/20/22 0734 08/21/22 0027 08/22/22 0029 08/23/22 0021  AST 12* 11* 10* 13* 12*  ALT '10 13 12 13 10  '$ ALKPHOS 47 50 48 52 48  BILITOT 0.8 0.4 0.6 0.4 0.2*  PROT 6.1* 6.4* 5.9* 6.3* 5.7*  ALBUMIN 2.7* 3.0* 2.9* 3.1* 2.9*    CBG: Recent Labs  Lab 08/24/22 0655 08/24/22 1221 08/24/22 1705 08/24/22 2102 08/25/22 0628  GLUCAP 145* 157* 169* 138* 121*     Recent Results (from the past 240 hour(s))  Respiratory (~20 pathogens) panel by PCR     Status: None   Collection Time: 08/19/22 10:10 AM   Specimen: Nasopharyngeal Swab; Respiratory  Result Value Ref Range Status   Adenovirus NOT DETECTED NOT DETECTED Final   Coronavirus 229E NOT DETECTED NOT DETECTED Final    Comment: (NOTE) The Coronavirus on the Respiratory Panel, DOES NOT test for the novel  Coronavirus (2019 nCoV)    Coronavirus HKU1 NOT DETECTED NOT DETECTED Final   Coronavirus NL63 NOT DETECTED NOT DETECTED Final   Coronavirus OC43 NOT DETECTED NOT DETECTED Final   Metapneumovirus NOT DETECTED NOT DETECTED Final   Rhinovirus / Enterovirus NOT DETECTED NOT DETECTED Final   Influenza A NOT DETECTED NOT DETECTED Final   Influenza B NOT DETECTED NOT DETECTED Final   Parainfluenza Virus 1 NOT DETECTED NOT DETECTED Final   Parainfluenza Virus 2 NOT DETECTED NOT DETECTED Final   Parainfluenza Virus 3 NOT DETECTED NOT DETECTED Final   Parainfluenza Virus 4 NOT DETECTED NOT DETECTED Final   Respiratory Syncytial Virus NOT DETECTED NOT DETECTED Final   Bordetella pertussis NOT DETECTED NOT DETECTED Final   Bordetella Parapertussis NOT DETECTED NOT DETECTED Final   Chlamydophila pneumoniae NOT DETECTED NOT DETECTED Final   Mycoplasma pneumoniae NOT DETECTED NOT DETECTED Final    Comment: Performed at [facility], [place]. [address], [place], [place] [address]  SARS Coronavirus 2 by RT PCR (hospital order, performed in [facility] hospital lab)  *cepheid single result test*     Status: None   Collection Time: 08/19/22 10:10 AM  Result Value Ref Range Status   SARS Coronavirus 2 by RT PCR NEGATIVE NEGATIVE Final    Comment: (NOTE) SARS-CoV-2 target nucleic acids are NOT DETECTED.  The  SARS-CoV-2 RNA is generally detectable in upper and lower respiratory specimens during the acute phase of infection. The lowest concentration of SARS-CoV-2 viral copies this assay can detect is 250 copies / mL. A negative result does not preclude SARS-CoV-2 infection and should not be used as the sole basis for treatment or other patient management decisions.  A negative result may occur with improper specimen collection / handling, submission of specimen other than nasopharyngeal swab, presence of viral mutation(s) within the areas targeted by this assay, and inadequate number of viral copies (<250 copies / mL). A negative result must be combined with clinical observations, patient history, and epidemiological information.  Fact Sheet for Patients:   https://www.patel.info/  Fact Sheet for Healthcare Providers: https://hall.com/  This test is not yet approved or  cleared by the [place] FDA and has been authorized for detection and/or diagnosis of SARS-CoV-2 by FDA under an Emergency Use Authorization (EUA).  This EUA will remain in effect (meaning this test can be used) for the duration of the COVID-19 declaration under Section 564(b)(1) of the Act, 21 U.S.C. section 360bbb-3(b)(1), unless the authorization is terminated or revoked sooner.  Performed at [facility], [place] [address], [place], [place] [address]     Scheduled Meds:  apixaban  2.5 mg Oral BID   calcium carbonate  1 tablet Oral BID   FLUoxetine  20 mg Oral Q breakfast   furosemide  40 mg Intravenous Q6H   insulin aspart  0-9 Units Subcutaneous TID WC   insulin detemir  9 Units Subcutaneous Daily   ipratropium-albuterol   3 mL Nebulization BID   levothyroxine  50 mcg Oral QAC breakfast   midodrine  5 mg Oral TID WC   polyethylene glycol  17 g Oral BID   pramipexole  0.5 mg Oral QHS   QUEtiapine  100 mg Oral QHS   senna-docusate  1 tablet Oral QHS   sodium chloride flush  3 mL Intravenous Q12H   Continuous Infusions:  amiodarone 30 mg/hr (08/25/22 0730)      LOS: 12 days    Time spent: 28mn  PDomenic Polite MD [facility]   08/25/2022, 10:13 AM

## 2022-08-25 NOTE — Progress Notes (Signed)
Palliative Medicine Inpatient Follow Up Note HPI: 88 y.o. female  with past medical history of heart failure (EF 20 to 25% per TTE on 04/13/2021), NICM, PAF (on Eliquis), CKD (stage IIIb), insulin-dependent type 2 diabetic, HTN, hypothyroidism, HLD, anxiety, delusional parasitosis, mild cognitive impairment (Lewy body dementia?). Came in for generalized fatigue and treated for MF PNA as well as CHF exacerbation.   Palliative care called by nursing per family request on 11/11. Meeting set up for 11/12 at [place] Discussion 08/25/2022  *Please note that this is a verbal dictation therefore any spelling or grammatical errors are due to the "[place] One" system interpretation.  Chart reviewed inclusive of vital signs, progress notes, laboratory results, and diagnostic images.   I met at bedside with Melissa [name], her daughter, Melissa [name], granddaughter, Melissa [name], brother and great granddaughter.   Discussed Melissa [name]'s present health state inclusive of end stage heart failure, diabetes, kidney disease, and recent pneumonia. Reviewed how taxing patients prolonged hospitalization has been. Shared with patients family the difference between hospice and palliative care. They express knowledge as patient had been on [place] prior to hospitalization. Discussed with patients family the concerns they have in the setting of Melissa [name]'s worsening symptoms - dyspnea most notably. We discussed various methods of improving it though I shared in the setting of all Melissa [name] has going on she may be lacking to reserve for recovery.  We talked about continuing present measures or changing our focus to comfort care. I was able to speak to [facility] directly regarding her wishes in her families presence. She expresses feeling tired and went around the room asking each family members their thoughts. The consensus was that of focusing on dignity, quality, and symptom relief. We talked about transition to comfort measures in  house and what that would entail inclusive of medications to control pain, dyspnea, agitation, nausea, itching, and hiccups.  We discussed stopping all uneccessary measures such as cardiac monitoring, blood draws, needle sticks, and frequent vital signs. Utilized reflective listening throughout our time together.   Created space and opportunity for patient and family to explore thoughts feelings and fears regarding current medical situation.They are all unified in desiring for [facility] to no long suffer. Melissa [name] herself vocalizes the desire for relief and discusses her decline over the past few months.   We reviewed the plan for comfort care. Patients family vocalize the desire to transition to inpatient hospice if possible at [facility] and if not accepted there they are willing for patient to go to inpatient hospice through hospice of the [place]. Melissa [name] does have active symptom needs at this time in the setting of her ongoing dyspnea.   Questions and concerns addressed/Palliative Support Provided.   Objective Assessment: Vital Signs Vitals:   08/25/22 0159 08/25/22 0259  BP:  119/78  Pulse:  (!) 106  Resp: 16 17  Temp:  97.9 F (36.6 C)  SpO2:  97%    Intake/Output Summary (Last 24 hours) at 08/25/2022 9024 Last data filed at 08/25/2022 0500 Gross per 24 hour  Intake 420.42 ml  Output 1450 ml  Net -1029.58 ml   Last Weight  Most recent update: 08/25/2022  3:04 AM    Weight  71.5 kg (157 lb 9.6 oz)            Gen:  Frail elderly caucasian F in NAD HEENT: Dry  mucous membranes CV: Irregular rate and rhythm  PULM: On 3LPM [place], tachypnea ABD: soft/nontender  EXT: BLE pitting  edema Neuro: Alert and oriented x2 - not fully understanding all of hospitalization  SUMMARY OF RECOMMENDATIONS   DNAR/DNI  Comfort Care  Added dilaudid and ativan ATC to support symptoms of dyspnea  Additional medications per [facility]  Appreciate TOC helping in regards to inpatient hospice  placement  Patient has active symptom burden in the setting of dyspnea from PNA and CHF  Time Spent: 65 Billing based on MDM: High ______________________________________________________________________________________ [place] Team Team Cell Phone: [phone] Please utilize secure chat with additional questions, if there is no response within 30 minutes please call the above phone number  Palliative Medicine Team providers are available by phone from 7am to 7pm daily and can be reached through the team cell phone.  Should this patient require assistance outside of these hours, please call the patient's attending physician.

## 2022-08-25 NOTE — Social Work (Signed)
CSW acknowledged a consult for residential hospice for pt. CSW followed up with pt's daughter [name] to confirm choice of [facility]. [name] confirmed choice of [facility]. CSW alerted on call staff at [facility] of request.

## 2022-08-26 NOTE — Progress Notes (Signed)
Seen and examined, resting comfortably, grandson at bedside -Continue comfort care  -plan for residential hospice, TOC following  Domenic Polite, MD

## 2022-08-26 NOTE — Plan of Care (Signed)
  Problem: Coping: Goal: Level of anxiety will decrease Outcome: Progressing   Problem: Pain Managment: Goal: General experience of comfort will improve Outcome: Progressing   Problem: Safety: Goal: Ability to remain free from injury will improve Outcome: Progressing   Problem: Role Relationship: Goal: Family's ability to cope with current situation will improve Outcome: Progressing

## 2022-08-26 NOTE — Progress Notes (Signed)
Patient RC:Melissa [name]      DOB: March 22, 1928      GTX:[id number]      Palliative Medicine Team    Subjective: Bedside symptom check completed. Daughter [name] bedside at time of visit.   Physical exam: Patient sitting up in bed visiting with her daughter at time of visit. Breathing even and slightly labored/shallow, no excessive secretions noted, audible wheezing present. Patient without physical or non-verbal signs of pain or discomfort at this time. Patient denies pain when asked. Her only complaint is feeling warm. This RN made an ice pack for the patient to put behind her neck, daughter providing back rub.    Assessment and plan: Residential hospice not able to offer a bed today for patient. She remains stable for transfer once bed is offered. Daughter asking to speak with [facility] liaison, this RN reached out to liaison to call. No other needs or concerns identified at this time, daughter is willing to wait for [facility] bed. Will continue to follow for any changes or advances.    Thank you for allowing the Palliative Medicine Team to assist in the care of this patient.     Damian Leavell, MSN, [place] Palliative Medicine Team Team Phone: [phone]  This phone is monitored 7a-7p, please reach out to attending physician outside of these hours for urgent needs.

## 2022-08-26 NOTE — Consult Note (Signed)
   [facility] Inpatient Consult   08/26/2022  TANAYSIA BHARDWAJ 1928-02-02 [id number]  [place] [facility] [ACO] Patient: Medicare ACO REACH  Primary Care Provider:  Darreld Mclean, MD with [place] [facility] at [facility] is listed to provide the transition of care   Patient screened for length of stay hospitalization with noted extreme high risk and to  assess for potential [place] [facility] service needs for post hospital transition for care coordination.  Review of patient's electronic medical record reveals patient is currently being recommended for residential hospice   Plan: No [facility] Care Coordination needs are planned as hospice care is for the transitional post hospital plan. Will sign off at transition.   For questions contact:   Natividad Brood, RN BSN [place]  [phone] business mobile phone Toll free office [phone]  *[place]  [phone] Fax number: [phone] [email or web address]

## 2022-08-26 NOTE — Progress Notes (Signed)
Manufacturing engineer [facility]) Hospital Liaison Note  Unfortunately, [facility] is unable to offer a room today. Hospital Liaison will follow up tomorrow or sooner if a room becomes available.   Please call with any questions or concerns. Thank you  Roselee Nova, [place] Hospital Liaison  [phone]

## 2022-08-27 MED ORDER — MORPHINE SULFATE (CONCENTRATE) 10 MG /0.5 ML PO SOLN: 5.0000 mg | ORAL | 0 refills | Status: AC | PRN

## 2022-08-27 NOTE — Progress Notes (Signed)
Report called to [facility] place, spoke with Tiffany. Waiting for [facility] for transport.

## 2022-08-27 NOTE — Discharge Summary (Signed)
Physician Discharge Summary  [name] HTD:[id number] DOB: 1928-05-18 DOA: 08/13/2022  PCP: Darreld Mclean, MD  Admit date: 08/13/2022 Discharge date: 08/27/2022  Time spent: 35 minutes  Recommendations for Outpatient Follow-up:  Residential hospice for comfort focused care   Discharge Diagnoses:  Acute on chronic systolic CHF Aspiration pneumonia AKI on CKD3a Hypotension   Paroxysmal atrial fibrillation with rapid ventricular response ([place]) Active Problems:   AKI (acute kidney injury) ([place])   Hypotension   Chronic HFrEF (heart failure with reduced ejection fraction) ([place])   Type 2 diabetes mellitus with diabetic neuropathy, with long-term current use of insulin ([facility])   Hypothyroidism, acquired   PAF (paroxysmal atrial fibrillation) ([place])   Acute on chronic combined systolic and diastolic CHF (congestive heart failure) ([place])   Discharge Condition: Guarded  Diet recommendation: Comfort feeds  Filed Weights   08/22/22 0300 08/23/22 0342 08/25/22 0304  Weight: 68.7 kg 72.4 kg 71.5 kg    History of present illness:  Melissa [name] is a 88 y.o. female with medical history significant for HFrEF (EF 20-25% per TTE 04/13/2021), NICM, PAF on Eliquis, CKD stage IIIb, insulin-dependent T2DM, HTN, hypothyroidism, HLD, anxiety, delusional parasitosis, mild cognitive impairment on home hospice services who was admitted with paroxysmal atrial fibrillation with RVR and CHF.  Seen by cardiology and started on IV amiodarone drip.  She was started on unasyn with concern for aspiration pneumonia as well.  She developed progressive acute kidney injury , creatinine has plateau'd and is improving, but she's developed worsening CXR findings, rising weights suggestive of HF exacerbation.  Required bipap 11/6 as well as diuresis with lasix.   -Intermittent rapid A-fib, back on IV amiodarone -Required frequent dosing of IV Lasix as well as BiPAP -Per cards not a candidate for advanced therapies,  palliative care consulted  Hospital Course:   Acute on Chronic Hypoxic Respiratory failure  Acute on Chronic systolic CHF, RV failure EF 20-25% by last TTE 04/14/6202.  , complicated by AFib RVR, hypotension, AKI,  -repeat echo-> EF 25%, global hypokinesis, moderately reduced RVSF  -Was under hospice services just prior to admission and this was cancelled for this admission, per cards she is not a candidate for advanced therapies -Treated for aspiration pneumonia and CHF with A-fib RVR -Required BiPAP, treatment limited by hypotension and AKI -Despite maximal medical therapy could still continue to deteriorate, seen by palliative care and decision made for comfort measures only -She will be discharged to residential hospice for end-of-life care  Paroxysmal atrial fibrillation with rapid ventricular response ([place]) -Now comfort care   AKI on CKD 3a Acute Urinary Retention -Comfort care   Delirium  Abd pain, constipation   Type 2 diabetes mellitus    Hypothyroidism, acquired   Hypomagnesemia   Anemia    Discharge Exam: Vitals:   08/27/22 0300 08/27/22 0556  BP:  125/84  Pulse: (!) 102   Resp: 14   Temp:  (!) 97.4 F (36.3 C)  SpO2: 98%      Discharge Instructions    Allergies as of 08/27/2022       Reactions   Gabapentin Other (See Comments)   hallucinations   Metoprolol Other (See Comments)   hypotensin        Medication List     STOP taking these medications    albuterol 108 (90 Base) MCG/ACT inhaler Commonly known as: VENTOLIN HFA   blood glucose meter kit and supplies Kit   Blood Glucose Monitoring Suppl Devi   cetirizine 10  MG tablet Commonly known as: ZYRTEC   cyanocobalamin 1000 MCG tablet Commonly known as: VITAMIN B12   Eliquis 2.5 MG Tabs tablet Generic drug: apixaban   Flutter Devi   guaiFENesin-dextromethorphan 100-10 MG/5ML syrup Commonly known as: ROBITUSSIN DM   HumaLOG KwikPen 100 UNIT/ML KwikPen Generic drug: insulin  lispro   Integra 62.5-62.5-40-3 MG Caps   Levemir FlexTouch 100 UNIT/ML FlexPen Generic drug: insulin detemir   levofloxacin 500 MG tablet Commonly known as: LEVAQUIN   levothyroxine 50 MCG tablet Commonly known as: SYNTHROID   metoprolol succinate 25 MG 24 hr tablet Commonly known as: Toprol XL   OneTouch Delica Lancets 08Q Misc   OneTouch Verio test strip Generic drug: glucose blood   pantoprazole 40 MG tablet Commonly known as: PROTONIX   polyethylene glycol 17 g packet Commonly known as: MIRALAX / GLYCOLAX   pramipexole 0.25 MG tablet Commonly known as: Mirapex   pregabalin 100 MG capsule Commonly known as: LYRICA   sucralfate 1 GM/10ML suspension Commonly known as: CARAFATE       TAKE these medications    FLUoxetine 20 MG capsule Commonly known as: PROzac Take 1 capsule (20 mg total) by mouth daily. What changed:  how much to take when to take this   furosemide 40 MG tablet Commonly known as: LASIX TAKE 1 TABLET DAILY   morphine CONCENTRATE 10 mg / 0.5 ml concentrated solution Take 0.25 mLs (5 mg total) by mouth every 4 (four) hours as needed for severe pain, shortness of breath or moderate pain.   QUEtiapine 50 MG tablet Commonly known as: SEROQUEL TAKE 2 TABLETS AT BEDTIME   senna 8.6 MG Tabs tablet Commonly known as: SENOKOT Take 2 tablets by mouth daily as needed (constipation).   traZODone 100 MG tablet Commonly known as: DESYREL Take 1 tablet (100 mg total) by mouth at bedtime.       Allergies  Allergen Reactions   Gabapentin Other (See Comments)    hallucinations   Metoprolol Other (See Comments)    hypotensin      The results of significant diagnostics from this hospitalization (including imaging, microbiology, ancillary and laboratory) are listed below for reference.    Significant Diagnostic Studies: DG Abd 1 View  Result Date: 08/21/2022 CLINICAL DATA:  Abdominal pain and constipation EXAM: ABDOMEN - 1 VIEW  COMPARISON:  CT 12/31/2021 FINDINGS: Cholecystectomy clips in the right upper quadrant. No dilated small or large bowel. Overall amount of fecal matter within the colon appears within normal limits. No significant bone finding. IMPRESSION: No acute finding. Overall amount of fecal matter within the colon appears within normal limits. Electronically Signed   By: Nelson Chimes M.D.   On: 08/21/2022 10:14   DG CHEST PORT 1 VIEW  Result Date: 08/19/2022 CLINICAL DATA:  Shortness of breath. EXAM: PORTABLE CHEST 1 VIEW COMPARISON:  Radiograph yesterday FINDINGS: Increasing multifocal lung opacities, greatest in the lung bases, but also increasing in the suprahilar regions. Stable heart size and mediastinal contours. Suspect developing pleural effusions. No pneumothorax. IMPRESSION: 1. Increasing multifocal lung opacities, greatest in the lung bases, but also increasing in the suprahilar regions. Findings suspicious for worsening multifocal pneumonia. 2. Suspect developing pleural effusions. Electronically Signed   By: Keith Rake M.D.   On: 08/19/2022 17:30   DG CHEST PORT 1 VIEW  Result Date: 08/18/2022 CLINICAL DATA:  Shortness of breath. EXAM: PORTABLE CHEST 1 VIEW COMPARISON:  Radiograph 08/16/2022 FINDINGS: Stable upper normal heart size. Unchanged mediastinal contours. Aortic atherosclerosis. Similar appearance  of basilar predominant airspace opacities without significant interval change. Possible small left pleural effusion. No pneumothorax. IMPRESSION: Unchanged basilar predominant airspace opacities, suspicious for atypical pneumonia. Possible small left pleural effusion. Electronically Signed   By: Keith Rake M.D.   On: 08/18/2022 17:44   DG CHEST PORT 1 VIEW  Result Date: 08/16/2022 CLINICAL DATA:  Chest pain EXAM: PORTABLE CHEST 1 VIEW COMPARISON:  Chest x-ray dated August 14, 2022; chest CT dated December 18, 2021 FINDINGS: Cardiac and mediastinal contours are unchanged. Basilar predominant  airspace opacities, similar to prior exam. Unchanged small scattered bilateral nodular opacities. No large pleural effusion or pneumothorax. IMPRESSION: 1. Basilar predominant opacities, similar to prior exam likely due to atelectasis. 2. Unchanged small scattered bilateral nodular opacities, likely due to chronic atypical infection. Electronically Signed   By: Yetta Glassman M.D.   On: 08/16/2022 08:50   ECHOCARDIOGRAM COMPLETE  Result Date: 08/14/2022    ECHOCARDIOGRAM REPORT   Patient Name:   CIELLE AGUILA Date of Exam: 08/14/2022 Medical Rec #:  [id number]     Height:       64.0 in Accession #:    [id number]    Weight:       140.0 lb Date of Birth:  31-Jul-1928     BSA:          1.681 m Patient Age:    88 years      BP:           81/71 mmHg Patient Gender: F             HR:           121 bpm. Exam Location:  Inpatient Procedure: 2D Echo, Color Doppler and Cardiac Doppler Indications:    Atrial fibrillation  History:        Patient has prior history of Echocardiogram examinations, most                 recent 04/13/2021. HFrEF and Cardiomyopathy, Arrythmias:Atrial                 Fibrillation, Signs/Symptoms:Dyspnea and Fatigue; Risk                 Factors:Diabetes, Hypertension and Dyslipidemia. CKD.  Sonographer:    Eartha Inch Referring Phys: [id number] VISHAL R PATEL  Sonographer Comments: Technically difficult study due to poor echo windows. Image acquisition challenging due to patient body habitus and Image acquisition challenging due to respiratory motion. IMPRESSIONS  1. Left ventricular ejection fraction, by estimation, is 25%. The left ventricle has severely decreased function. The left ventricle demonstrates global hypokinesis. Left ventricular diastolic parameters are indeterminate.  2. Right ventricular systolic function is moderately reduced. The right ventricular size is normal. There is normal pulmonary artery systolic pressure. The estimated right ventricular systolic pressure is 36.4 mmHg.  3.  Left atrial size was moderately dilated.  4. The mitral valve is abnormal with restricted posterior leaflet. Moderate mitral valve regurgitation. No evidence of mitral stenosis.  5. Tricuspid valve regurgitation is mild to moderate.  6. The aortic valve is tricuspid. Aortic valve regurgitation is not visualized. No aortic stenosis is present.  7. The inferior vena cava is normal in size with <50% respiratory variability, suggesting right atrial pressure of 8 mmHg.  8. The patient was in atrial fibrillation. FINDINGS  Left Ventricle: Left ventricular ejection fraction, by estimation, is 25%. The left ventricle has severely decreased function. The left ventricle demonstrates global hypokinesis. The left ventricular internal cavity size  was normal in size. There is no left ventricular hypertrophy. Left ventricular diastolic parameters are indeterminate. Right Ventricle: The right ventricular size is normal. No increase in right ventricular wall thickness. Right ventricular systolic function is moderately reduced. There is normal pulmonary artery systolic pressure. The tricuspid regurgitant velocity is 2.24 m/s, and with an assumed right atrial pressure of 8 mmHg, the estimated right ventricular systolic pressure is 33.2 mmHg. Left Atrium: Left atrial size was moderately dilated. Right Atrium: Right atrial size was normal in size. Pericardium: Trivial pericardial effusion is present. Mitral Valve: The mitral valve is abnormal. Moderate mitral valve regurgitation. No evidence of mitral valve stenosis. MV peak gradient, 10.4 mmHg. The mean mitral valve gradient is 6.0 mmHg. Tricuspid Valve: The tricuspid valve is normal in structure. Tricuspid valve regurgitation is mild to moderate. Aortic Valve: The aortic valve is tricuspid. Aortic valve regurgitation is not visualized. No aortic stenosis is present. Pulmonic Valve: The pulmonic valve was normal in structure. Pulmonic valve regurgitation is trivial. Aorta: The aortic  root is normal in size and structure. Venous: The inferior vena cava is normal in size with less than 50% respiratory variability, suggesting right atrial pressure of 8 mmHg. IAS/Shunts: No atrial level shunt detected by color flow Doppler.  LEFT VENTRICLE PLAX 2D LVIDd:         5.30 cm      Diastology LVIDs:         5.10 cm      LV e' medial:   7.38 cm/s LV PW:         1.00 cm      LV E/e' medial: 18.0 LV IVS:        0.70 cm LVOT diam:     1.80 cm LVOT Area:     2.54 cm  LV Volumes (MOD) LV vol d, MOD A2C: 114.0 ml LV vol d, MOD A4C: 97.4 ml LV vol s, MOD A2C: 72.3 ml LV vol s, MOD A4C: 74.7 ml LV SV MOD A2C:     41.7 ml LV SV MOD A4C:     97.4 ml LV SV MOD BP:      31.8 ml RIGHT VENTRICLE         IVC TAPSE (M-mode): 1.1 cm  IVC diam: 2.00 cm LEFT ATRIUM             Index        RIGHT ATRIUM           Index LA diam:        3.90 cm 2.32 cm/m   RA Area:     12.70 cm LA Vol (A2C):   80.5 ml 47.88 ml/m  RA Volume:   27.10 ml  16.12 ml/m LA Vol (A4C):   73.0 ml 43.42 ml/m LA Biplane Vol: 80.7 ml 48.00 ml/m   AORTA Ao Root diam: 3.10 cm Ao Asc diam:  3.00 cm MITRAL VALVE                TRICUSPID VALVE MV Area (PHT): 3.61 cm     TR Peak grad:   20.1 mmHg MV Peak grad:  10.4 mmHg    TR Vmax:        224.00 cm/s MV Mean grad:  6.0 mmHg MV Vmax:       1.61 m/s     SHUNTS MV Vmean:      119.0 cm/s   Systemic Diam: 1.80 cm MV Decel Time: 210 msec MR Peak grad: 54.2 mmHg MR  Mean grad: 40.0 mmHg MR Vmax:      368.00 cm/s MR Vmean:     307.0 cm/s MV E velocity: 133.00 cm/s Dalton McleanMD Electronically signed by Franki Monte Signature Date/Time: 08/14/2022/2:42:15 PM    Final    DG CHEST PORT 1 VIEW  Result Date: 08/14/2022 CLINICAL DATA:  Shortness of breath. EXAM: PORTABLE CHEST 1 VIEW COMPARISON:  Chest x-ray from yesterday. FINDINGS: The heart size and mediastinal contours are within normal limits. Normal pulmonary vascularity. Increasing interstitial markings in the left mid lung and right lung base. No focal  consolidation, pleural effusion, or pneumothorax. No acute osseous abnormality. Prior right proximal humerus ORIF. IMPRESSION: 1. Increasing interstitial markings in the left mid lung and right lung base, concerning for atypical infection. Electronically Signed   By: Titus Dubin M.D.   On: 08/14/2022 10:37   US RENAL  Result Date: 08/13/2022 CLINICAL DATA:  Acute renal failure superimposed on stage III B chronic kidney disease. EXAM: RENAL / URINARY TRACT ULTRASOUND COMPLETE COMPARISON:  CT 12/31/2021 FINDINGS: Right Kidney: Renal measurements: 7.8 x 3.7 x 3.7 cm = volume: 56 mL. Increased renal parenchymal echogenicity and thinning of the parenchyma. No hydronephrosis. No evidence of renal stone or focal lesion. Left Kidney: Renal measurements: 7.9 x 4.5 x 3.2 cm = volume: 59 mL. Increased renal parenchymal echogenicity and thinning of the parenchyma. No hydronephrosis. No evidence of renal stone or focal lesion. Bladder: Appears normal for degree of bladder distention. Other: None. IMPRESSION: 1. Bilateral renal parenchymal thinning and increased echogenicity consistent with chronic medical renal disease. 2. No obstructive uropathy or focal renal abnormality. Electronically Signed   By: Keith Rake M.D.   On: 08/13/2022 21:28   DG Chest Portable 1 View  Result Date: 08/13/2022 CLINICAL DATA:  Sepsis. EXAM: PORTABLE CHEST 1 VIEW COMPARISON:  Chest radiograph dated 06/16/2022. FINDINGS: There is diffuse chronic interstitial coarsening and bronchitic changes. Bilateral streaky densities may be chronic or represent vascular congestion. Developing infiltrate is not excluded. No focal consolidation, pleural effusion or pneumothorax. The cardiac silhouette is within normal limits. Atherosclerotic calcification of the aorta. No acute osseous pathology. IMPRESSION: Chronic interstitial coarsening and bronchitic changes. Developing infiltrate is not excluded. Electronically Signed   By: Anner Crete  M.D.   On: 08/13/2022 18:11    Microbiology: Recent Results (from the past 240 hour(s))  Respiratory (~20 pathogens) panel by PCR     Status: None   Collection Time: 08/19/22 10:10 AM   Specimen: Nasopharyngeal Swab; Respiratory  Result Value Ref Range Status   Adenovirus NOT DETECTED NOT DETECTED Final   Coronavirus 229E NOT DETECTED NOT DETECTED Final    Comment: (NOTE) The Coronavirus on the Respiratory Panel, DOES NOT test for the novel  Coronavirus (2019 nCoV)    Coronavirus HKU1 NOT DETECTED NOT DETECTED Final   Coronavirus NL63 NOT DETECTED NOT DETECTED Final   Coronavirus OC43 NOT DETECTED NOT DETECTED Final   Metapneumovirus NOT DETECTED NOT DETECTED Final   Rhinovirus / Enterovirus NOT DETECTED NOT DETECTED Final   Influenza A NOT DETECTED NOT DETECTED Final   Influenza B NOT DETECTED NOT DETECTED Final   Parainfluenza Virus 1 NOT DETECTED NOT DETECTED Final   Parainfluenza Virus 2 NOT DETECTED NOT DETECTED Final   Parainfluenza Virus 3 NOT DETECTED NOT DETECTED Final   Parainfluenza Virus 4 NOT DETECTED NOT DETECTED Final   Respiratory Syncytial Virus NOT DETECTED NOT DETECTED Final   Bordetella pertussis NOT DETECTED NOT DETECTED Final   Bordetella  Parapertussis NOT DETECTED NOT DETECTED Final   Chlamydophila pneumoniae NOT DETECTED NOT DETECTED Final   Mycoplasma pneumoniae NOT DETECTED NOT DETECTED Final    Comment: Performed at [facility], [place] [address] [place], [place] [address]  SARS Coronavirus 2 by RT PCR (hospital order, performed in [facility] hospital lab) *cepheid single result test*     Status: None   Collection Time: 08/19/22 10:10 AM  Result Value Ref Range Status   SARS Coronavirus 2 by RT PCR NEGATIVE NEGATIVE Final    Comment: (NOTE) SARS-CoV-2 target nucleic acids are NOT DETECTED.  The SARS-CoV-2 RNA is generally detectable in upper and lower respiratory specimens during the acute phase of infection. The lowest concentration of  SARS-CoV-2 viral copies this assay can detect is 250 copies / mL. A negative result does not preclude SARS-CoV-2 infection and should not be used as the sole basis for treatment or other patient management decisions.  A negative result may occur with improper specimen collection / handling, submission of specimen other than nasopharyngeal swab, presence of viral mutation(s) within the areas targeted by this assay, and inadequate number of viral copies (<250 copies / mL). A negative result must be combined with clinical observations, patient history, and epidemiological information.  Fact Sheet for Patients:   https://www.patel.info/  Fact Sheet for Healthcare Providers: https://hall.com/  This test is not yet approved or  cleared by the [place] FDA and has been authorized for detection and/or diagnosis of SARS-CoV-2 by FDA under an Emergency Use Authorization (EUA).  This EUA will remain in effect (meaning this test can be used) for the duration of the COVID-19 declaration under Section 564(b)(1) of the Act, 21 U.S.C. section 360bbb-3(b)(1), unless the authorization is terminated or revoked sooner.  Performed at [facility], [place] [address] [place], [place] [address]      Labs: Basic Metabolic Panel: Recent Labs  Lab 08/21/22 0027 08/22/22 0029 08/23/22 0021 08/24/22 0019 08/25/22 0012  NA 134* 131* 130* 126* 125*  K 4.5 4.6 4.6 4.7 3.9  CL 99 95* 92* 88* 85*  CO2 _0 GLUCOSE 125* 132* 122* 154* 120*  BUN 38* 34* 32* 28* 25*  CREATININE 1.84* 1.93* 1.80* 1.78* 1.59*  CALCIUM 8.6* 8.9 8.5* 8.4* 8.5*  MG 2.3  --   --   --   --   PHOS 3.1  --   --   --   --    Liver Function Tests: Recent Labs  Lab 08/21/22 0027 08/22/22 0029 08/23/22 0021  AST 10* 13* 12*  ALT _1 ALKPHOS 48 52 48  BILITOT 0.6 0.4 0.2*  PROT 5.9* 6.3* 5.7*  ALBUMIN 2.9* 3.1* 2.9*   Recent Labs  Lab 08/21/22 0019   LIPASE 59*   No results for input(s): "AMMONIA" in the last 168 hours. CBC: Recent Labs  Lab 08/21/22 0027 08/22/22 0029 08/23/22 0021 08/24/22 0019  WBC 6.5 6.0 6.2 7.4  NEUTROABS 4.6  --   --   --   HGB 9.7* 10.3* 9.3* 9.4*  HCT 29.9* 33.2* 29.2* 29.5*  MCV 85.4 86.7 86.1 84.8  PLT 186 172 164 175   Cardiac Enzymes: No results for input(s): "CKTOTAL", "CKMB", "CKMBINDEX", "TROPONINI" in the last 168 hours. BNP: BNP (last 3 results) Recent Labs    06/09/22 2008 08/19/22 0017  BNP 77.6 1,406.9*    ProBNP (last 3 results) No results for input(s): "PROBNP" in the last 8760  hours.  CBG: Recent Labs  Lab 08/24/22 1221 08/24/22 1705 08/24/22 2102 08/25/22 0628 08/25/22 1056  GLUCAP 157* 169* 138* 121* 175*       Signed:  Domenic Polite MD.  [facility] 08/27/2022, 12:09 PM

## 2022-08-27 NOTE — Progress Notes (Signed)
Unit secretary called [facility] to verify what time they are coming for  pick up for transport to [facility], was told that no one made the arrangement.[facility]ed [facility] , spoke with wanda and let her know that unit secretary already  made arrangement with [facility] and pick up will be within 3 hours. Pt's daughter made aware and it's ok with them.[facility] claimed that they receive pt. at any time.

## 2022-08-27 NOTE — Progress Notes (Signed)
PA gave her a sip of water and started choking and coughing. Unable to spit phlegm and started wheezing.  Continue to monitor, wheezing subsided.

## 2022-08-27 NOTE — Progress Notes (Signed)
[facility] arrived to transport patient from [facility] to [facility] Once arriving all paperwork ws not completed so they waited for [place] to finish what was necessary for transport. V/S were taken and she was stable. Patient was awake and speaking clearly when she left. Family had left as well. Flowers could not be transported via [employer]. Fransisco Hertz, her daughter was called to tell her about transport and what time they left [facility] which was 2151 for [employer]. Also, I reported patients flowers could not be transported via [facility] and asked her if she wanted to come by tomorrow to pick them up for patient. She stated she would come by for the flowers, which are on desk at nurses's station with patients name on the vase. Patient was transported with her PIV intact as directed to do so. All questions were answered and patient's daughter was appreciative of calling her to let her know her mother was being transported tonight. Patient's small black neck pillow was transported with patient. Daughter stated she would call [facility] to check on her mother's arrival.

## 2022-08-27 NOTE — Progress Notes (Signed)
[facility] was called again to find out what time frame they would be at [facility] to transport patient to [facility]. [facility] reported about 30-40 minutes from phone call. Patient is awake and talking with her family. All personal belongings were taken from room via family and her flowers were left behind. Waiting for [facility] to transport patient.

## 2022-08-27 NOTE — Progress Notes (Signed)
Patient was transported to [facility] with her PIV intact. Flushing well and patient had no complaints.

## 2022-08-27 NOTE — Progress Notes (Signed)
Patient LY:YTKP OSMARA DRUMMONDS      DOB: 06/24/1928      TWS:[id number]      Palliative Medicine Team    Subjective: 09:30: Bedside symptom check completed. Daughter [name] bedside at time of visit.    Physical exam: Patient sitting up in bed with eyes closed at time of visit. Patient is very restless and uncomfortable appearing with frequent shifting and fidgeting. Breathing even but very labored, shallow, audible wheezing noted, 2L nasal cannula applied. Bedside RN endorses that patient has had two episodes now of aspiration suspected (one with thin liquids and one with solids) with significant shortness of breath and wheezing, respiratory distress to follow. Patient is able to open eyes and answer simple one word answers when questioned but does not sustain eye contact or hold conversation.    Assessment and plan: This RN discussed with patient and daughter the assessed patient status. This RN with concern of progression of heart failure symptoms noted today compared to exam yesterday. Discussed with bedside RN, Chat, about scheduled mediations and PRN options available. Daughter [name] in agreement with comfort focused care, even if it makes the patient more lethargic and less interactive.  10:40 This RN followed up to bedside, patient remains comfortable and resting after mediation administration (see eMAR). Hospice liaison from [facility] to meet family at bedside this morning and discuss options. Family shared with this RN that they would really prefer a residential unit, open to other agencies if [facility] is unable to offer a bed.   11:45 Notified by [facility] liaison that patient is approved for bed at [facility], hopeful for transfer today.   13:00 This RN returned bedside, patient remains comfortable and appropriate for transfer to residential facility today. Family without additional questions or concerns at this time. Will continue to follow for any changes or advances.    Thank you for allowing  the Palliative Medicine Team to assist in the care of this patient.     Damian Leavell, MSN, [place] Palliative Medicine Team Team Phone: [phone]  This phone is monitored 7a-7p, please reach out to attending physician outside of these hours for urgent needs. Patient PR:FFMB A Karow      DOB: 1928/03/24      WGY:[id number]

## 2022-08-27 NOTE — Progress Notes (Signed)
Manufacturing engineer [facility]) Hospital Liaison Note  Bed offered and accepted for transfer today to [facility]. Unit RN please call report to [phone] prior to patient leaving the unit. Please send signed DNR and paperwork with patient.   Please leave all IV access and ports in place.   Please call with any questions or concerns. Thank you  Roselee Nova, [place] [facility] Liaison [phone]
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
## 2022-09-02 ENCOUNTER — Telehealth: Payer: Self-pay | Admitting: Family Medicine

## 2022-09-02 NOTE — Telephone Encounter (Signed)
Called and LMOM with daughter and grand-daughter.

## 2022-09-13 DEATH — deceased

## 2022-09-16 ENCOUNTER — Other Ambulatory Visit: Payer: Self-pay | Admitting: Family Medicine

## 2022-10-28 ENCOUNTER — Ambulatory Visit: Payer: Medicare Other | Admitting: Neurology
